# Patient Record
Sex: Male | Born: 1955 | Race: Black or African American | Hispanic: No | Marital: Single | Smoking: Never smoker
Health system: Southern US, Community
[De-identification: ages and names within clinical notes are randomized; demographics above are authoritative.]

## PROBLEM LIST (undated history)

## (undated) DIAGNOSIS — M329 Systemic lupus erythematosus, unspecified: Secondary | ICD-10-CM

## (undated) DIAGNOSIS — D61818 Other pancytopenia: Secondary | ICD-10-CM

## (undated) DIAGNOSIS — Z992 Dependence on renal dialysis: Secondary | ICD-10-CM

## (undated) DIAGNOSIS — K219 Gastro-esophageal reflux disease without esophagitis: Secondary | ICD-10-CM

## (undated) DIAGNOSIS — M3214 Glomerular disease in systemic lupus erythematosus: Secondary | ICD-10-CM

## (undated) DIAGNOSIS — B20 Human immunodeficiency virus [HIV] disease: Secondary | ICD-10-CM

## (undated) DIAGNOSIS — Z8711 Personal history of peptic ulcer disease: Secondary | ICD-10-CM

## (undated) DIAGNOSIS — W19XXXA Unspecified fall, initial encounter: Secondary | ICD-10-CM

## (undated) DIAGNOSIS — Z8739 Personal history of other diseases of the musculoskeletal system and connective tissue: Secondary | ICD-10-CM

## (undated) DIAGNOSIS — N151 Renal and perinephric abscess: Secondary | ICD-10-CM

## (undated) DIAGNOSIS — Z8719 Personal history of other diseases of the digestive system: Secondary | ICD-10-CM

## (undated) DIAGNOSIS — K220 Achalasia of cardia: Secondary | ICD-10-CM

## (undated) DIAGNOSIS — E785 Hyperlipidemia, unspecified: Secondary | ICD-10-CM

## (undated) DIAGNOSIS — B3781 Candidal esophagitis: Secondary | ICD-10-CM

## (undated) DIAGNOSIS — K648 Other hemorrhoids: Secondary | ICD-10-CM

## (undated) DIAGNOSIS — R2681 Unsteadiness on feet: Secondary | ICD-10-CM

## (undated) DIAGNOSIS — G4700 Insomnia, unspecified: Secondary | ICD-10-CM

## (undated) DIAGNOSIS — Z9289 Personal history of other medical treatment: Secondary | ICD-10-CM

## (undated) DIAGNOSIS — I82409 Acute embolism and thrombosis of unspecified deep veins of unspecified lower extremity: Secondary | ICD-10-CM

## (undated) DIAGNOSIS — Z21 Asymptomatic human immunodeficiency virus [HIV] infection status: Secondary | ICD-10-CM

## (undated) DIAGNOSIS — N186 End stage renal disease: Secondary | ICD-10-CM

## (undated) DIAGNOSIS — I493 Ventricular premature depolarization: Secondary | ICD-10-CM

## (undated) DIAGNOSIS — I10 Essential (primary) hypertension: Secondary | ICD-10-CM

## (undated) HISTORY — PX: HERNIA REPAIR: SHX51

## (undated) HISTORY — DX: Renal and perinephric abscess: N15.1

## (undated) HISTORY — DX: Essential (primary) hypertension: I10

## (undated) HISTORY — DX: Asymptomatic human immunodeficiency virus (hiv) infection status: Z21

## (undated) HISTORY — DX: Other hemorrhoids: K64.8

## (undated) HISTORY — DX: Unsteadiness on feet: R26.81

## (undated) HISTORY — DX: Hyperlipidemia, unspecified: E78.5

## (undated) HISTORY — DX: Achalasia of cardia: K22.0

## (undated) HISTORY — PX: NEPHRECTOMY: SHX65

## (undated) HISTORY — PX: KIDNEY TRANSPLANT: SHX239

## (undated) HISTORY — DX: Insomnia, unspecified: G47.00

## (undated) HISTORY — DX: Human immunodeficiency virus (HIV) disease: B20

---

## 1997-04-03 HISTORY — PX: HELLER MYOTOMY: SHX5259

## 1997-07-29 ENCOUNTER — Encounter: Admission: RE | Admit: 1997-07-29 | Discharge: 1997-07-29 | Payer: Self-pay | Admitting: Family Medicine

## 1997-08-10 ENCOUNTER — Ambulatory Visit (HOSPITAL_COMMUNITY): Admission: RE | Admit: 1997-08-10 | Discharge: 1997-08-10 | Payer: Self-pay | Admitting: Family Medicine

## 1997-09-29 ENCOUNTER — Ambulatory Visit (HOSPITAL_COMMUNITY): Admission: RE | Admit: 1997-09-29 | Discharge: 1997-09-29 | Payer: Self-pay | Admitting: Gastroenterology

## 1997-10-12 ENCOUNTER — Ambulatory Visit (HOSPITAL_COMMUNITY): Admission: RE | Admit: 1997-10-12 | Discharge: 1997-10-12 | Payer: Self-pay | Admitting: Gastroenterology

## 1997-12-22 ENCOUNTER — Encounter: Payer: Self-pay | Admitting: General Surgery

## 1997-12-22 ENCOUNTER — Inpatient Hospital Stay (HOSPITAL_COMMUNITY): Admission: RE | Admit: 1997-12-22 | Discharge: 1997-12-23 | Payer: Self-pay | Admitting: General Surgery

## 1998-02-01 ENCOUNTER — Ambulatory Visit (HOSPITAL_COMMUNITY): Admission: RE | Admit: 1998-02-01 | Discharge: 1998-02-01 | Payer: Self-pay | Admitting: General Surgery

## 1998-02-01 ENCOUNTER — Encounter: Payer: Self-pay | Admitting: General Surgery

## 1998-06-16 ENCOUNTER — Encounter: Admission: RE | Admit: 1998-06-16 | Discharge: 1998-06-16 | Payer: Self-pay | Admitting: Family Medicine

## 1999-02-03 ENCOUNTER — Encounter: Admission: RE | Admit: 1999-02-03 | Discharge: 1999-02-03 | Payer: Self-pay | Admitting: Family Medicine

## 1999-06-08 ENCOUNTER — Encounter: Admission: RE | Admit: 1999-06-08 | Discharge: 1999-06-08 | Payer: Self-pay | Admitting: Family Medicine

## 2000-08-23 ENCOUNTER — Encounter: Admission: RE | Admit: 2000-08-23 | Discharge: 2000-08-23 | Payer: Self-pay | Admitting: Family Medicine

## 2003-07-28 ENCOUNTER — Encounter: Admission: RE | Admit: 2003-07-28 | Discharge: 2003-07-28 | Payer: Self-pay | Admitting: Sports Medicine

## 2003-08-04 ENCOUNTER — Encounter: Admission: RE | Admit: 2003-08-04 | Discharge: 2003-08-04 | Payer: Self-pay | Admitting: Sports Medicine

## 2003-08-04 ENCOUNTER — Encounter: Admission: RE | Admit: 2003-08-04 | Discharge: 2003-08-04 | Payer: Self-pay | Admitting: Family Medicine

## 2003-08-26 ENCOUNTER — Encounter: Admission: RE | Admit: 2003-08-26 | Discharge: 2003-08-26 | Payer: Self-pay | Admitting: Family Medicine

## 2004-11-11 ENCOUNTER — Ambulatory Visit: Payer: Self-pay | Admitting: Family Medicine

## 2004-12-29 ENCOUNTER — Ambulatory Visit: Payer: Self-pay | Admitting: Family Medicine

## 2005-06-16 ENCOUNTER — Ambulatory Visit: Payer: Self-pay | Admitting: Sports Medicine

## 2005-06-27 ENCOUNTER — Ambulatory Visit: Payer: Self-pay | Admitting: Sports Medicine

## 2005-12-05 ENCOUNTER — Ambulatory Visit: Payer: Self-pay | Admitting: Family Medicine

## 2005-12-12 ENCOUNTER — Encounter: Admission: RE | Admit: 2005-12-12 | Discharge: 2005-12-12 | Payer: Self-pay | Admitting: Sports Medicine

## 2005-12-25 ENCOUNTER — Ambulatory Visit: Payer: Self-pay | Admitting: Family Medicine

## 2006-02-01 ENCOUNTER — Ambulatory Visit (HOSPITAL_COMMUNITY): Admission: RE | Admit: 2006-02-01 | Discharge: 2006-02-01 | Payer: Self-pay | Admitting: Gastroenterology

## 2006-02-15 ENCOUNTER — Ambulatory Visit: Payer: Self-pay | Admitting: Family Medicine

## 2006-04-03 HISTORY — PX: COLONOSCOPY: SHX174

## 2006-04-25 ENCOUNTER — Encounter: Payer: Self-pay | Admitting: Family Medicine

## 2006-04-25 ENCOUNTER — Ambulatory Visit: Payer: Self-pay | Admitting: Family Medicine

## 2006-04-25 LAB — CONVERTED CEMR LAB
INR: 1.3 (ref 0.0–1.5)
Prothrombin Time: 16.5 s — ABNORMAL HIGH (ref 11.6–15.2)
aPTT: 34 s (ref 24–37)

## 2006-05-18 ENCOUNTER — Ambulatory Visit (HOSPITAL_COMMUNITY): Admission: RE | Admit: 2006-05-18 | Discharge: 2006-05-18 | Payer: Self-pay | Admitting: Gastroenterology

## 2006-05-31 DIAGNOSIS — F5232 Male orgasmic disorder: Secondary | ICD-10-CM | POA: Insufficient documentation

## 2006-05-31 DIAGNOSIS — G4700 Insomnia, unspecified: Secondary | ICD-10-CM | POA: Insufficient documentation

## 2006-05-31 DIAGNOSIS — E785 Hyperlipidemia, unspecified: Secondary | ICD-10-CM | POA: Insufficient documentation

## 2006-05-31 DIAGNOSIS — R1310 Dysphagia, unspecified: Secondary | ICD-10-CM | POA: Insufficient documentation

## 2006-05-31 DIAGNOSIS — I10 Essential (primary) hypertension: Secondary | ICD-10-CM | POA: Insufficient documentation

## 2006-06-27 ENCOUNTER — Ambulatory Visit (HOSPITAL_COMMUNITY): Admission: RE | Admit: 2006-06-27 | Discharge: 2006-06-27 | Payer: Self-pay | Admitting: Gastroenterology

## 2006-06-29 ENCOUNTER — Telehealth: Payer: Self-pay | Admitting: *Deleted

## 2006-07-02 ENCOUNTER — Ambulatory Visit: Payer: Self-pay | Admitting: Sports Medicine

## 2006-07-24 ENCOUNTER — Encounter: Payer: Self-pay | Admitting: *Deleted

## 2006-07-24 ENCOUNTER — Encounter (INDEPENDENT_AMBULATORY_CARE_PROVIDER_SITE_OTHER): Payer: Self-pay | Admitting: *Deleted

## 2006-07-24 ENCOUNTER — Ambulatory Visit: Payer: Self-pay | Admitting: Family Medicine

## 2006-07-24 DIAGNOSIS — R634 Abnormal weight loss: Secondary | ICD-10-CM | POA: Insufficient documentation

## 2006-07-24 DIAGNOSIS — B20 Human immunodeficiency virus [HIV] disease: Secondary | ICD-10-CM | POA: Insufficient documentation

## 2006-07-24 LAB — CONVERTED CEMR LAB
BUN: 21 mg/dL (ref 6–23)
CO2: 26 meq/L (ref 19–32)
Calcium: 9.4 mg/dL (ref 8.4–10.5)
Chloride: 101 meq/L (ref 96–112)
Creatinine, Ser: 1.46 mg/dL (ref 0.40–1.50)
Direct LDL: 65 mg/dL
Glucose, Bld: 79 mg/dL (ref 70–99)
HIV-1 antibody: POSITIVE — AB
HIV-2 Ab: UNDETERMINED — AB
HIV: REACTIVE
Potassium: 4.5 meq/L (ref 3.5–5.3)
Sodium: 138 meq/L (ref 135–145)
TSH: 2.872 microintl units/mL (ref 0.350–5.50)

## 2006-07-27 ENCOUNTER — Telehealth (INDEPENDENT_AMBULATORY_CARE_PROVIDER_SITE_OTHER): Payer: Self-pay | Admitting: *Deleted

## 2006-08-01 ENCOUNTER — Ambulatory Visit: Payer: Self-pay | Admitting: Family Medicine

## 2006-08-01 ENCOUNTER — Encounter: Payer: Self-pay | Admitting: Internal Medicine

## 2006-08-01 ENCOUNTER — Other Ambulatory Visit: Admission: RE | Admit: 2006-08-01 | Discharge: 2006-08-01 | Payer: Self-pay | Admitting: *Deleted

## 2006-08-01 ENCOUNTER — Ambulatory Visit: Admission: RE | Admit: 2006-08-01 | Discharge: 2006-08-01 | Payer: Self-pay | Admitting: Family Medicine

## 2006-08-01 ENCOUNTER — Encounter (INDEPENDENT_AMBULATORY_CARE_PROVIDER_SITE_OTHER): Payer: Self-pay | Admitting: *Deleted

## 2006-08-01 LAB — CONVERTED CEMR LAB
ALT: 41 units/L (ref 0–53)
AST: 34 units/L (ref 0–37)
Albumin: 3.8 g/dL (ref 3.5–5.2)
Alkaline Phosphatase: 81 units/L (ref 39–117)
BUN: 17 mg/dL (ref 6–23)
CO2: 24 meq/L (ref 19–32)
Calcium: 9.2 mg/dL (ref 8.4–10.5)
Chloride: 102 meq/L (ref 96–112)
Creatinine, Ser: 1.27 mg/dL (ref 0.40–1.50)
Glucose, Bld: 92 mg/dL (ref 70–99)
HCV Ab: NEGATIVE
HIV 1 RNA Quant: 15400 copies/mL — ABNORMAL HIGH (ref <50)
HIV-1 RNA Quant, Log: 4.19 — ABNORMAL HIGH (ref <1.70)
Hep A Total Ab: NEGATIVE
Hep B S Ab: NEGATIVE
Hepatitis B Surface Ag: NEGATIVE
Potassium: 4.5 meq/L (ref 3.5–5.3)
Sodium: 138 meq/L (ref 135–145)
Total Bilirubin: 0.5 mg/dL (ref 0.3–1.2)
Total Protein: 8.8 g/dL — ABNORMAL HIGH (ref 6.0–8.3)
Toxoplasma IgG Ratio: 12.5

## 2006-08-03 ENCOUNTER — Ambulatory Visit: Payer: Self-pay | Admitting: Family Medicine

## 2006-08-16 ENCOUNTER — Encounter: Admission: RE | Admit: 2006-08-16 | Discharge: 2006-08-16 | Payer: Self-pay | Admitting: Internal Medicine

## 2006-08-16 ENCOUNTER — Ambulatory Visit: Payer: Self-pay | Admitting: Internal Medicine

## 2006-08-16 LAB — CONVERTED CEMR LAB
ALT: 19 units/L (ref 0–53)
AST: 20 units/L (ref 0–37)
Albumin: 3.6 g/dL (ref 3.5–5.2)
Alkaline Phosphatase: 67 units/L (ref 39–117)
BUN: 16 mg/dL (ref 6–23)
Basophils Absolute: 0 10*3/uL (ref 0.0–0.1)
Basophils Relative: 0 % (ref 0–1)
Bilirubin Urine: NEGATIVE
CD4 Count: 600 microliters
CO2: 25 meq/L (ref 19–32)
Calcium: 9.1 mg/dL (ref 8.4–10.5)
Chlamydia, Swab/Urine, PCR: NEGATIVE
Chloride: 102 meq/L (ref 96–112)
Cholesterol: 102 mg/dL (ref 0–200)
Creatinine, Ser: 1.17 mg/dL (ref 0.40–1.50)
Eosinophils Absolute: 0.1 10*3/uL (ref 0.0–0.7)
Eosinophils Relative: 3 % (ref 0–5)
GC Probe Amp, Urine: NEGATIVE
Glucose, Bld: 108 mg/dL — ABNORMAL HIGH (ref 70–99)
HCT: 32.7 % — ABNORMAL LOW (ref 39.0–52.0)
HCV Ab: NEGATIVE
HDL: 31 mg/dL — ABNORMAL LOW (ref >39)
HIV 1 RNA Quant: 7470 copies/mL — ABNORMAL HIGH (ref <50)
HIV-1 RNA Quant, Log: 3.87 — ABNORMAL HIGH (ref <1.70)
HIV-1 antibody: POSITIVE — AB
HIV-2 Ab: UNDETERMINED — AB
HIV: REACTIVE
Hemoglobin, Urine: NEGATIVE
Hemoglobin: 10.6 g/dL — ABNORMAL LOW (ref 13.0–17.0)
Hep B Core Total Ab: NEGATIVE
Hep B S Ab: NEGATIVE
Hepatitis B Surface Ag: NEGATIVE
Ketones, ur: NEGATIVE mg/dL
LDL Cholesterol: 59 mg/dL (ref 0–99)
Leukocytes, UA: NEGATIVE
Lymphocytes Relative: 45 % (ref 12–46)
Lymphs Abs: 2.2 10*3/uL (ref 0.7–3.3)
MCHC: 32.4 g/dL (ref 30.0–36.0)
MCV: 91.9 fL (ref 78.0–100.0)
Monocytes Absolute: 0.6 10*3/uL (ref 0.2–0.7)
Monocytes Relative: 12 % — ABNORMAL HIGH (ref 3–11)
Neutro Abs: 2 10*3/uL (ref 1.7–7.7)
Neutrophils Relative %: 41 % — ABNORMAL LOW (ref 43–77)
Nitrite: NEGATIVE
Platelets: 213 10*3/uL (ref 150–400)
Potassium: 4.2 meq/L (ref 3.5–5.3)
Protein, ur: NEGATIVE mg/dL
RBC: 3.56 M/uL — ABNORMAL LOW (ref 4.22–5.81)
RDW: 14.5 % — ABNORMAL HIGH (ref 11.5–14.0)
Sodium: 138 meq/L (ref 135–145)
Specific Gravity, Urine: 1.024 (ref 1.005–1.03)
Total Bilirubin: 0.4 mg/dL (ref 0.3–1.2)
Total CHOL/HDL Ratio: 3.3
Total Protein: 8.5 g/dL — ABNORMAL HIGH (ref 6.0–8.3)
Triglycerides: 62 mg/dL (ref <150)
Urine Glucose: NEGATIVE mg/dL
Urobilinogen, UA: 0.2 (ref 0.0–1.0)
VLDL: 12 mg/dL (ref 0–40)
WBC: 4.9 10*3/uL (ref 4.0–10.5)
pH: 6 (ref 5.0–8.0)

## 2006-08-22 ENCOUNTER — Ambulatory Visit: Payer: Self-pay | Admitting: Sports Medicine

## 2006-08-31 ENCOUNTER — Ambulatory Visit: Payer: Self-pay | Admitting: Internal Medicine

## 2006-08-31 DIAGNOSIS — D649 Anemia, unspecified: Secondary | ICD-10-CM

## 2006-08-31 DIAGNOSIS — D539 Nutritional anemia, unspecified: Secondary | ICD-10-CM | POA: Insufficient documentation

## 2006-09-19 ENCOUNTER — Encounter (INDEPENDENT_AMBULATORY_CARE_PROVIDER_SITE_OTHER): Payer: Self-pay | Admitting: *Deleted

## 2006-09-24 ENCOUNTER — Ambulatory Visit: Payer: Self-pay | Admitting: Family Medicine

## 2006-09-28 ENCOUNTER — Encounter: Payer: Self-pay | Admitting: Internal Medicine

## 2006-10-02 ENCOUNTER — Ambulatory Visit: Payer: Self-pay | Admitting: Internal Medicine

## 2006-10-09 ENCOUNTER — Encounter: Payer: Self-pay | Admitting: Internal Medicine

## 2006-11-12 ENCOUNTER — Ambulatory Visit: Payer: Self-pay | Admitting: Internal Medicine

## 2006-11-12 ENCOUNTER — Encounter: Admission: RE | Admit: 2006-11-12 | Discharge: 2006-11-12 | Payer: Self-pay | Admitting: Internal Medicine

## 2006-11-12 LAB — CONVERTED CEMR LAB
ALT: 22 units/L (ref 0–53)
AST: 24 units/L (ref 0–37)
Albumin: 3.7 g/dL (ref 3.5–5.2)
Alkaline Phosphatase: 60 units/L (ref 39–117)
BUN: 12 mg/dL (ref 6–23)
Basophils Absolute: 0 10*3/uL (ref 0.0–0.1)
Basophils Relative: 1 % (ref 0–1)
CO2: 30 meq/L (ref 19–32)
Calcium: 9.1 mg/dL (ref 8.4–10.5)
Chloride: 105 meq/L (ref 96–112)
Creatinine, Ser: 1.25 mg/dL (ref 0.40–1.50)
Eosinophils Absolute: 0.1 10*3/uL (ref 0.0–0.7)
Eosinophils Relative: 3 % (ref 0–5)
Glucose, Bld: 103 mg/dL — ABNORMAL HIGH (ref 70–99)
HCT: 37.6 % — ABNORMAL LOW (ref 39.0–52.0)
HIV 1 RNA Quant: 9280 copies/mL — ABNORMAL HIGH (ref <50)
HIV-1 RNA Quant, Log: 3.97 — ABNORMAL HIGH (ref <1.70)
Hemoglobin: 12.3 g/dL — ABNORMAL LOW (ref 13.0–17.0)
Lymphocytes Relative: 45 % (ref 12–46)
Lymphs Abs: 1.9 10*3/uL (ref 0.7–3.3)
MCHC: 32.7 g/dL (ref 30.0–36.0)
MCV: 90.6 fL (ref 78.0–100.0)
Monocytes Absolute: 0.5 10*3/uL (ref 0.2–0.7)
Monocytes Relative: 12 % — ABNORMAL HIGH (ref 3–11)
Neutro Abs: 1.8 10*3/uL (ref 1.7–7.7)
Neutrophils Relative %: 40 % — ABNORMAL LOW (ref 43–77)
Platelets: 171 10*3/uL (ref 150–400)
Potassium: 4.6 meq/L (ref 3.5–5.3)
RBC: 4.15 M/uL — ABNORMAL LOW (ref 4.22–5.81)
RDW: 13.5 % (ref 11.5–14.0)
Sodium: 140 meq/L (ref 135–145)
Total Bilirubin: 0.4 mg/dL (ref 0.3–1.2)
Total Protein: 8.6 g/dL — ABNORMAL HIGH (ref 6.0–8.3)
WBC: 4.3 10*3/uL (ref 4.0–10.5)

## 2006-11-26 ENCOUNTER — Encounter (INDEPENDENT_AMBULATORY_CARE_PROVIDER_SITE_OTHER): Payer: Self-pay | Admitting: *Deleted

## 2006-11-28 ENCOUNTER — Ambulatory Visit: Payer: Self-pay | Admitting: Internal Medicine

## 2007-02-19 ENCOUNTER — Ambulatory Visit: Payer: Self-pay | Admitting: Internal Medicine

## 2007-02-19 ENCOUNTER — Encounter: Admission: RE | Admit: 2007-02-19 | Discharge: 2007-02-19 | Payer: Self-pay | Admitting: Internal Medicine

## 2007-02-19 LAB — CONVERTED CEMR LAB
ALT: 40 units/L (ref 0–53)
AST: 31 units/L (ref 0–37)
Albumin: 3.7 g/dL (ref 3.5–5.2)
Alkaline Phosphatase: 66 units/L (ref 39–117)
BUN: 16 mg/dL (ref 6–23)
Basophils Absolute: 0 10*3/uL (ref 0.0–0.1)
Basophils Relative: 1 % (ref 0–1)
CO2: 27 meq/L (ref 19–32)
Calcium: 9.1 mg/dL (ref 8.4–10.5)
Chloride: 105 meq/L (ref 96–112)
Cholesterol: 105 mg/dL (ref 0–200)
Creatinine, Ser: 1.37 mg/dL (ref 0.40–1.50)
Eosinophils Absolute: 0.1 10*3/uL — ABNORMAL LOW (ref 0.2–0.7)
Eosinophils Relative: 3 % (ref 0–5)
Glucose, Bld: 91 mg/dL (ref 70–99)
HCT: 41 % (ref 39.0–52.0)
HDL: 33 mg/dL — ABNORMAL LOW (ref >39)
HIV 1 RNA Quant: 6840 copies/mL — ABNORMAL HIGH (ref <50)
HIV-1 RNA Quant, Log: 3.84 — ABNORMAL HIGH (ref <1.70)
Hemoglobin: 12.9 g/dL — ABNORMAL LOW (ref 13.0–17.0)
LDL Cholesterol: 60 mg/dL (ref 0–99)
Lymphocytes Relative: 63 % — ABNORMAL HIGH (ref 12–46)
Lymphs Abs: 2.7 10*3/uL (ref 0.7–4.0)
MCHC: 31.5 g/dL (ref 30.0–36.0)
MCV: 91.5 fL (ref 78.0–100.0)
Monocytes Absolute: 0.4 10*3/uL (ref 0.1–1.0)
Monocytes Relative: 8 % (ref 3–12)
Neutro Abs: 1.1 10*3/uL — ABNORMAL LOW (ref 1.7–7.7)
Neutrophils Relative %: 26 % — ABNORMAL LOW (ref 43–77)
Platelets: 161 10*3/uL (ref 150–400)
Potassium: 4.3 meq/L (ref 3.5–5.3)
RBC: 4.48 M/uL (ref 4.22–5.81)
RDW: 13.5 % (ref 11.5–15.5)
Sodium: 141 meq/L (ref 135–145)
Total Bilirubin: 0.5 mg/dL (ref 0.3–1.2)
Total CHOL/HDL Ratio: 3.2
Total Protein: 8.7 g/dL — ABNORMAL HIGH (ref 6.0–8.3)
Triglycerides: 58 mg/dL (ref <150)
VLDL: 12 mg/dL (ref 0–40)
WBC: 4.4 10*3/uL (ref 4.0–10.5)

## 2007-03-08 ENCOUNTER — Ambulatory Visit: Payer: Self-pay | Admitting: Internal Medicine

## 2007-03-20 ENCOUNTER — Encounter: Payer: Self-pay | Admitting: Internal Medicine

## 2007-03-20 LAB — CONVERTED CEMR LAB
HCV Ab: NEGATIVE
Hep B S Ab: NEGATIVE
Hepatitis B Surface Ag: NEGATIVE

## 2007-04-01 ENCOUNTER — Encounter: Payer: Self-pay | Admitting: Internal Medicine

## 2007-05-01 ENCOUNTER — Telehealth: Payer: Self-pay | Admitting: *Deleted

## 2007-05-06 ENCOUNTER — Ambulatory Visit: Payer: Self-pay | Admitting: Family Medicine

## 2007-05-06 ENCOUNTER — Encounter (INDEPENDENT_AMBULATORY_CARE_PROVIDER_SITE_OTHER): Payer: Self-pay | Admitting: *Deleted

## 2007-05-07 ENCOUNTER — Encounter (INDEPENDENT_AMBULATORY_CARE_PROVIDER_SITE_OTHER): Payer: Self-pay | Admitting: *Deleted

## 2007-05-07 LAB — CONVERTED CEMR LAB: PSA: 0.43 ng/mL (ref 0.10–4.00)

## 2007-05-14 ENCOUNTER — Encounter: Admission: RE | Admit: 2007-05-14 | Discharge: 2007-05-14 | Payer: Self-pay | Admitting: Internal Medicine

## 2007-05-14 ENCOUNTER — Ambulatory Visit: Payer: Self-pay | Admitting: Internal Medicine

## 2007-05-14 LAB — CONVERTED CEMR LAB
ALT: 46 units/L (ref 0–53)
AST: 39 units/L — ABNORMAL HIGH (ref 0–37)
Albumin: 3.8 g/dL (ref 3.5–5.2)
Alkaline Phosphatase: 80 units/L (ref 39–117)
BUN: 22 mg/dL (ref 6–23)
Basophils Absolute: 0 10*3/uL (ref 0.0–0.1)
Basophils Relative: 1 % (ref 0–1)
CO2: 25 meq/L (ref 19–32)
Calcium: 9.4 mg/dL (ref 8.4–10.5)
Chloride: 103 meq/L (ref 96–112)
Creatinine, Ser: 1.53 mg/dL — ABNORMAL HIGH (ref 0.40–1.50)
Eosinophils Absolute: 0.2 10*3/uL (ref 0.0–0.7)
Eosinophils Relative: 3 % (ref 0–5)
Glucose, Bld: 95 mg/dL (ref 70–99)
HCT: 40 % (ref 39.0–52.0)
HIV 1 RNA Quant: 9900 copies/mL — ABNORMAL HIGH (ref <50)
HIV-1 RNA Quant, Log: 4 — ABNORMAL HIGH (ref <1.70)
Hemoglobin: 13.3 g/dL (ref 13.0–17.0)
Lymphocytes Relative: 57 % — ABNORMAL HIGH (ref 12–46)
Lymphs Abs: 3 10*3/uL (ref 0.7–4.0)
MCHC: 33.3 g/dL (ref 30.0–36.0)
MCV: 90.3 fL (ref 78.0–100.0)
Monocytes Absolute: 0.5 10*3/uL (ref 0.1–1.0)
Monocytes Relative: 9 % (ref 3–12)
Neutro Abs: 1.6 10*3/uL — ABNORMAL LOW (ref 1.7–7.7)
Neutrophils Relative %: 30 % — ABNORMAL LOW (ref 43–77)
Platelets: 212 10*3/uL (ref 150–400)
Potassium: 4.8 meq/L (ref 3.5–5.3)
RBC: 4.43 M/uL (ref 4.22–5.81)
RDW: 13 % (ref 11.5–15.5)
Sodium: 138 meq/L (ref 135–145)
Total Bilirubin: 0.5 mg/dL (ref 0.3–1.2)
Total Protein: 9.3 g/dL — ABNORMAL HIGH (ref 6.0–8.3)
WBC: 5.3 10*3/uL (ref 4.0–10.5)

## 2007-06-05 ENCOUNTER — Encounter (INDEPENDENT_AMBULATORY_CARE_PROVIDER_SITE_OTHER): Payer: Self-pay | Admitting: *Deleted

## 2007-06-05 ENCOUNTER — Ambulatory Visit: Payer: Self-pay | Admitting: Internal Medicine

## 2007-09-09 ENCOUNTER — Encounter (INDEPENDENT_AMBULATORY_CARE_PROVIDER_SITE_OTHER): Payer: Self-pay | Admitting: *Deleted

## 2007-10-15 ENCOUNTER — Telehealth: Payer: Self-pay | Admitting: *Deleted

## 2007-10-15 ENCOUNTER — Ambulatory Visit: Payer: Self-pay | Admitting: Family Medicine

## 2008-01-08 ENCOUNTER — Ambulatory Visit: Payer: Self-pay | Admitting: Internal Medicine

## 2008-01-08 LAB — CONVERTED CEMR LAB
ALT: 31 units/L (ref 0–53)
AST: 36 units/L (ref 0–37)
Albumin: 3.6 g/dL (ref 3.5–5.2)
Alkaline Phosphatase: 78 units/L (ref 39–117)
BUN: 11 mg/dL (ref 6–23)
Basophils Absolute: 0 10*3/uL (ref 0.0–0.1)
Basophils Relative: 0 % (ref 0–1)
CO2: 26 meq/L (ref 19–32)
Calcium: 8.9 mg/dL (ref 8.4–10.5)
Chloride: 104 meq/L (ref 96–112)
Creatinine, Ser: 1.15 mg/dL (ref 0.40–1.50)
Eosinophils Absolute: 0.2 10*3/uL (ref 0.0–0.7)
Eosinophils Relative: 4 % (ref 0–5)
Glucose, Bld: 87 mg/dL (ref 70–99)
HCT: 39.2 % (ref 39.0–52.0)
HIV 1 RNA Quant: 49300 copies/mL — ABNORMAL HIGH (ref <50)
HIV-1 RNA Quant, Log: 4.69 — ABNORMAL HIGH (ref <1.70)
Hemoglobin: 12.7 g/dL — ABNORMAL LOW (ref 13.0–17.0)
Lymphocytes Relative: 60 % — ABNORMAL HIGH (ref 12–46)
Lymphs Abs: 2.9 10*3/uL (ref 0.7–4.0)
MCHC: 32.4 g/dL (ref 30.0–36.0)
MCV: 89.5 fL (ref 78.0–100.0)
Monocytes Absolute: 0.4 10*3/uL (ref 0.1–1.0)
Monocytes Relative: 7 % (ref 3–12)
Neutro Abs: 1.4 10*3/uL — ABNORMAL LOW (ref 1.7–7.7)
Neutrophils Relative %: 28 % — ABNORMAL LOW (ref 43–77)
Platelets: 150 10*3/uL (ref 150–400)
Potassium: 4.1 meq/L (ref 3.5–5.3)
RBC: 4.38 M/uL (ref 4.22–5.81)
RDW: 13.5 % (ref 11.5–15.5)
Sodium: 139 meq/L (ref 135–145)
Total Bilirubin: 0.4 mg/dL (ref 0.3–1.2)
Total Protein: 8.6 g/dL — ABNORMAL HIGH (ref 6.0–8.3)
WBC: 4.9 10*3/uL (ref 4.0–10.5)

## 2008-01-22 ENCOUNTER — Ambulatory Visit: Payer: Self-pay | Admitting: Internal Medicine

## 2008-03-31 ENCOUNTER — Telehealth (INDEPENDENT_AMBULATORY_CARE_PROVIDER_SITE_OTHER): Payer: Self-pay | Admitting: Family Medicine

## 2008-04-21 ENCOUNTER — Encounter: Payer: Self-pay | Admitting: Internal Medicine

## 2008-07-16 ENCOUNTER — Encounter (INDEPENDENT_AMBULATORY_CARE_PROVIDER_SITE_OTHER): Payer: Self-pay | Admitting: Licensed Clinical Social Worker

## 2008-07-16 ENCOUNTER — Ambulatory Visit: Payer: Self-pay | Admitting: Internal Medicine

## 2008-07-16 LAB — CONVERTED CEMR LAB
ALT: 39 units/L (ref 0–53)
AST: 39 units/L — ABNORMAL HIGH (ref 0–37)
Alkaline Phosphatase: 77 units/L (ref 39–117)
Basophils Relative: 1 % (ref 0–1)
Calcium: 9 mg/dL (ref 8.4–10.5)
Chloride: 104 meq/L (ref 96–112)
Creatinine, Ser: 1.29 mg/dL (ref 0.40–1.50)
Eosinophils Absolute: 0.2 10*3/uL (ref 0.0–0.7)
Eosinophils Relative: 4 % (ref 0–5)
HCT: 35.7 % — ABNORMAL LOW (ref 39.0–52.0)
HDL: 26 mg/dL — ABNORMAL LOW (ref >39)
HIV 1 RNA Quant: 70800 copies/mL — ABNORMAL HIGH (ref <48)
LDL Cholesterol: 56 mg/dL (ref 0–99)
Lymphs Abs: 2.4 10*3/uL (ref 0.7–4.0)
MCHC: 32.5 g/dL (ref 30.0–36.0)
MCV: 90.2 fL (ref 78.0–100.0)
Monocytes Absolute: 0.5 10*3/uL (ref 0.1–1.0)
Monocytes Relative: 12 % (ref 3–12)
Neutrophils Relative %: 25 % — ABNORMAL LOW (ref 43–77)
Potassium: 4.1 meq/L (ref 3.5–5.3)
RBC: 3.96 M/uL — ABNORMAL LOW (ref 4.22–5.81)
Total CHOL/HDL Ratio: 3.5
VLDL: 10 mg/dL (ref 0–40)

## 2008-07-31 ENCOUNTER — Ambulatory Visit: Payer: Self-pay | Admitting: Internal Medicine

## 2008-09-17 ENCOUNTER — Ambulatory Visit: Payer: Self-pay | Admitting: Internal Medicine

## 2008-09-17 LAB — CONVERTED CEMR LAB
AST: 29 units/L (ref 0–37)
Albumin: 3.7 g/dL (ref 3.5–5.2)
Alkaline Phosphatase: 66 units/L (ref 39–117)
BUN: 13 mg/dL (ref 6–23)
Creatinine, Ser: 1.4 mg/dL (ref 0.40–1.50)
Eosinophils Absolute: 0.2 10*3/uL (ref 0.0–0.7)
Eosinophils Relative: 5 % (ref 0–5)
GFR calc non Af Amer: 53 mL/min — ABNORMAL LOW (ref >60)
Glucose, Bld: 94 mg/dL (ref 70–99)
HCT: 34.6 % — ABNORMAL LOW (ref 39.0–52.0)
HIV-1 RNA Quant, Log: 4.6 — ABNORMAL HIGH (ref <1.68)
Lymphocytes Relative: 53 % — ABNORMAL HIGH (ref 12–46)
Lymphs Abs: 2.5 10*3/uL (ref 0.7–4.0)
MCV: 89.2 fL (ref 78.0–100.0)
Monocytes Relative: 11 % (ref 3–12)
Neutrophils Relative %: 31 % — ABNORMAL LOW (ref 43–77)
Platelets: 180 10*3/uL (ref 150–400)
RBC: 3.88 M/uL — ABNORMAL LOW (ref 4.22–5.81)
Total Bilirubin: 0.3 mg/dL (ref 0.3–1.2)
WBC: 4.6 10*3/uL (ref 4.0–10.5)

## 2008-10-13 ENCOUNTER — Ambulatory Visit: Payer: Self-pay | Admitting: Internal Medicine

## 2008-11-11 ENCOUNTER — Ambulatory Visit: Payer: Self-pay | Admitting: Family Medicine

## 2008-11-16 ENCOUNTER — Encounter: Admission: RE | Admit: 2008-11-16 | Discharge: 2008-11-16 | Payer: Self-pay | Admitting: Family Medicine

## 2008-11-18 ENCOUNTER — Ambulatory Visit: Payer: Self-pay | Admitting: Family Medicine

## 2008-11-18 ENCOUNTER — Encounter: Payer: Self-pay | Admitting: Family Medicine

## 2008-11-19 LAB — CONVERTED CEMR LAB
Free T4: 0.88 ng/dL (ref 0.80–1.80)
TSH: 3.75 microintl units/mL (ref 0.350–4.500)

## 2008-11-20 ENCOUNTER — Telehealth: Payer: Self-pay | Admitting: *Deleted

## 2008-11-23 ENCOUNTER — Telehealth: Payer: Self-pay | Admitting: *Deleted

## 2008-11-24 ENCOUNTER — Ambulatory Visit: Payer: Self-pay | Admitting: Infectious Diseases

## 2008-11-24 LAB — CONVERTED CEMR LAB
Basophils Absolute: 0 10*3/uL (ref 0.0–0.1)
Basophils Relative: 0 % (ref 0–1)
Lymphocytes Relative: 32 % (ref 12–46)
MCHC: 32.7 g/dL (ref 30.0–36.0)
Neutro Abs: 1.7 10*3/uL (ref 1.7–7.7)
Neutrophils Relative %: 46 % (ref 43–77)
RDW: 14.1 % (ref 11.5–15.5)

## 2008-11-30 ENCOUNTER — Ambulatory Visit: Payer: Self-pay | Admitting: Infectious Diseases

## 2009-01-01 ENCOUNTER — Encounter: Payer: Self-pay | Admitting: Family Medicine

## 2009-01-01 ENCOUNTER — Emergency Department (HOSPITAL_COMMUNITY): Admission: EM | Admit: 2009-01-01 | Discharge: 2009-01-01 | Payer: Self-pay | Admitting: Family Medicine

## 2009-01-01 ENCOUNTER — Ambulatory Visit: Payer: Self-pay | Admitting: Family Medicine

## 2009-01-01 ENCOUNTER — Inpatient Hospital Stay (HOSPITAL_COMMUNITY): Admission: EM | Admit: 2009-01-01 | Discharge: 2009-01-09 | Payer: Self-pay | Admitting: Emergency Medicine

## 2009-01-02 ENCOUNTER — Encounter: Payer: Self-pay | Admitting: Family Medicine

## 2009-01-05 ENCOUNTER — Ambulatory Visit: Payer: Self-pay | Admitting: Infectious Disease

## 2009-01-06 ENCOUNTER — Encounter: Payer: Self-pay | Admitting: Family Medicine

## 2009-01-09 ENCOUNTER — Encounter: Payer: Self-pay | Admitting: Family Medicine

## 2009-01-11 ENCOUNTER — Telehealth: Payer: Self-pay

## 2009-01-12 ENCOUNTER — Telehealth: Payer: Self-pay | Admitting: *Deleted

## 2009-01-13 ENCOUNTER — Ambulatory Visit: Payer: Self-pay | Admitting: Infectious Disease

## 2009-01-15 ENCOUNTER — Ambulatory Visit: Payer: Self-pay | Admitting: Internal Medicine

## 2009-01-15 DIAGNOSIS — N151 Renal and perinephric abscess: Secondary | ICD-10-CM | POA: Insufficient documentation

## 2009-01-15 LAB — CONVERTED CEMR LAB
ALT: 17 units/L (ref 0–53)
AST: 20 units/L (ref 0–37)
Calcium: 8.6 mg/dL (ref 8.4–10.5)
Chloride: 105 meq/L (ref 96–112)
Creatinine, Ser: 1.67 mg/dL — ABNORMAL HIGH (ref 0.40–1.50)
Eosinophils Absolute: 0.1 10*3/uL (ref 0.0–0.7)
Lymphocytes Relative: 22 % (ref 12–46)
Lymphs Abs: 1.3 10*3/uL (ref 0.7–4.0)
Neutro Abs: 4.2 10*3/uL (ref 1.7–7.7)
Neutrophils Relative %: 71 % (ref 43–77)
Platelets: 314 10*3/uL (ref 150–400)
Potassium: 4.5 meq/L (ref 3.5–5.3)
RBC: 3.13 M/uL — ABNORMAL LOW (ref 4.22–5.81)
Sodium: 137 meq/L (ref 135–145)
Total Protein: 8.2 g/dL (ref 6.0–8.3)
WBC: 5.9 10*3/uL (ref 4.0–10.5)

## 2009-01-18 ENCOUNTER — Encounter: Payer: Self-pay | Admitting: Internal Medicine

## 2009-01-18 ENCOUNTER — Ambulatory Visit (HOSPITAL_COMMUNITY): Admission: RE | Admit: 2009-01-18 | Discharge: 2009-01-18 | Payer: Self-pay | Admitting: Internal Medicine

## 2009-01-19 ENCOUNTER — Telehealth: Payer: Self-pay | Admitting: Internal Medicine

## 2009-01-20 ENCOUNTER — Encounter: Payer: Self-pay | Admitting: Family Medicine

## 2009-01-20 ENCOUNTER — Ambulatory Visit: Payer: Self-pay | Admitting: Family Medicine

## 2009-01-20 DIAGNOSIS — K644 Residual hemorrhoidal skin tags: Secondary | ICD-10-CM | POA: Insufficient documentation

## 2009-01-20 DIAGNOSIS — N189 Chronic kidney disease, unspecified: Secondary | ICD-10-CM | POA: Insufficient documentation

## 2009-01-26 ENCOUNTER — Telehealth: Payer: Self-pay | Admitting: Infectious Disease

## 2009-01-28 ENCOUNTER — Encounter: Payer: Self-pay | Admitting: Infectious Disease

## 2009-02-05 ENCOUNTER — Telehealth: Payer: Self-pay | Admitting: Family Medicine

## 2009-02-08 ENCOUNTER — Ambulatory Visit: Payer: Self-pay | Admitting: Family Medicine

## 2009-02-08 DIAGNOSIS — R609 Edema, unspecified: Secondary | ICD-10-CM | POA: Insufficient documentation

## 2009-02-17 ENCOUNTER — Ambulatory Visit: Admission: RE | Admit: 2009-02-17 | Discharge: 2009-02-17 | Payer: Self-pay | Admitting: Urology

## 2009-02-17 ENCOUNTER — Encounter (INDEPENDENT_AMBULATORY_CARE_PROVIDER_SITE_OTHER): Payer: Self-pay | Admitting: Urology

## 2009-02-17 ENCOUNTER — Ambulatory Visit: Payer: Self-pay | Admitting: Vascular Surgery

## 2009-02-17 ENCOUNTER — Encounter: Payer: Self-pay | Admitting: Family Medicine

## 2009-02-19 ENCOUNTER — Telehealth: Payer: Self-pay | Admitting: Internal Medicine

## 2009-02-23 ENCOUNTER — Ambulatory Visit: Payer: Self-pay | Admitting: Internal Medicine

## 2009-02-23 LAB — CONVERTED CEMR LAB
AST: 17 units/L (ref 0–37)
Alkaline Phosphatase: 50 units/L (ref 39–117)
BUN: 9 mg/dL (ref 6–23)
Basophils Absolute: 0 10*3/uL (ref 0.0–0.1)
Creatinine, Ser: 1.03 mg/dL (ref 0.40–1.50)
Eosinophils Absolute: 0.1 10*3/uL (ref 0.0–0.7)
Eosinophils Relative: 3 % (ref 0–5)
HCT: 25.7 % — ABNORMAL LOW (ref 39.0–52.0)
Lymphocytes Relative: 52 % — ABNORMAL HIGH (ref 12–46)
Platelets: 205 10*3/uL (ref 150–400)
RDW: 22.8 % — ABNORMAL HIGH (ref 11.5–15.5)

## 2009-03-12 ENCOUNTER — Ambulatory Visit: Payer: Self-pay | Admitting: Internal Medicine

## 2009-03-18 ENCOUNTER — Encounter: Payer: Self-pay | Admitting: Family Medicine

## 2009-03-18 ENCOUNTER — Ambulatory Visit: Payer: Self-pay | Admitting: Family Medicine

## 2009-04-06 ENCOUNTER — Encounter: Payer: Self-pay | Admitting: Family Medicine

## 2009-04-15 ENCOUNTER — Inpatient Hospital Stay (HOSPITAL_COMMUNITY): Admission: RE | Admit: 2009-04-15 | Discharge: 2009-04-17 | Payer: Self-pay | Admitting: Urology

## 2009-04-15 ENCOUNTER — Encounter (INDEPENDENT_AMBULATORY_CARE_PROVIDER_SITE_OTHER): Payer: Self-pay | Admitting: Urology

## 2009-04-30 ENCOUNTER — Encounter: Payer: Self-pay | Admitting: Family Medicine

## 2009-05-07 ENCOUNTER — Encounter: Payer: Self-pay | Admitting: Family Medicine

## 2009-06-11 ENCOUNTER — Ambulatory Visit: Payer: Self-pay | Admitting: Internal Medicine

## 2009-06-11 LAB — CONVERTED CEMR LAB
ALT: 9 units/L (ref 0–53)
BUN: 10 mg/dL (ref 6–23)
CO2: 24 meq/L (ref 19–32)
Calcium: 9.1 mg/dL (ref 8.4–10.5)
Chloride: 107 meq/L (ref 96–112)
Creatinine, Ser: 1.44 mg/dL (ref 0.40–1.50)
Eosinophils Absolute: 0.1 10*3/uL (ref 0.0–0.7)
Eosinophils Relative: 2 % (ref 0–5)
HCT: 34.5 % — ABNORMAL LOW (ref 39.0–52.0)
HIV 1 RNA Quant: <48 copies/mL (ref <48)
Hemoglobin: 11.5 g/dL — ABNORMAL LOW (ref 13.0–17.0)
Lymphs Abs: 2 10*3/uL (ref 0.7–4.0)
MCV: 110.9 fL — ABNORMAL HIGH (ref >=78.0)
Monocytes Relative: 11 % (ref 3–12)
RBC: 3.11 M/uL — ABNORMAL LOW (ref 4.22–5.81)
WBC: 3.2 10*3/uL — ABNORMAL LOW (ref 4.0–10.5)

## 2009-06-25 ENCOUNTER — Ambulatory Visit: Payer: Self-pay | Admitting: Internal Medicine

## 2009-09-08 ENCOUNTER — Ambulatory Visit: Payer: Self-pay | Admitting: Internal Medicine

## 2009-09-08 LAB — CONVERTED CEMR LAB
ALT: 13 units/L (ref 0–53)
AST: 20 units/L (ref 0–37)
Alkaline Phosphatase: 85 units/L (ref 39–117)
Basophils Absolute: 0 10*3/uL (ref 0.0–0.1)
Basophils Relative: 1 % (ref 0–1)
CO2: 23 meq/L (ref 19–32)
MCHC: 33.5 g/dL (ref 30.0–36.0)
Monocytes Relative: 8 % (ref 3–12)
Neutro Abs: 2.9 10*3/uL (ref 1.7–7.7)
Neutrophils Relative %: 48 % (ref 43–77)
Platelets: 228 10*3/uL (ref 150–400)
RBC: 3.09 M/uL — ABNORMAL LOW (ref 4.22–5.81)
Sodium: 141 meq/L (ref 135–145)
Total Bilirubin: 0.3 mg/dL (ref 0.3–1.2)
Total Protein: 7.8 g/dL (ref 6.0–8.3)

## 2009-09-22 ENCOUNTER — Ambulatory Visit: Payer: Self-pay | Admitting: Internal Medicine

## 2009-10-13 ENCOUNTER — Encounter: Payer: Self-pay | Admitting: Family Medicine

## 2009-10-13 ENCOUNTER — Ambulatory Visit: Payer: Self-pay | Admitting: Family Medicine

## 2009-10-13 DIAGNOSIS — L820 Inflamed seborrheic keratosis: Secondary | ICD-10-CM | POA: Insufficient documentation

## 2009-10-14 ENCOUNTER — Encounter: Payer: Self-pay | Admitting: Family Medicine

## 2009-10-14 LAB — CONVERTED CEMR LAB
HDL: 29 mg/dL — ABNORMAL LOW (ref >39)
LDL Cholesterol: 83 mg/dL (ref 0–99)
Total CHOL/HDL Ratio: 4.7
Triglycerides: 119 mg/dL (ref <150)

## 2009-10-15 ENCOUNTER — Telehealth: Payer: Self-pay | Admitting: Family Medicine

## 2009-10-31 ENCOUNTER — Encounter: Payer: Self-pay | Admitting: Family Medicine

## 2009-12-22 ENCOUNTER — Ambulatory Visit: Payer: Self-pay | Admitting: Internal Medicine

## 2009-12-22 LAB — CONVERTED CEMR LAB
ALT: 13 units/L (ref 0–53)
Albumin: 4 g/dL (ref 3.5–5.2)
Alkaline Phosphatase: 84 units/L (ref 39–117)
Basophils Absolute: 0 10*3/uL (ref 0.0–0.1)
CO2: 27 meq/L (ref 19–32)
Eosinophils Absolute: 0.1 10*3/uL (ref 0.0–0.7)
Glucose, Bld: 97 mg/dL (ref 70–99)
HIV 1 RNA Quant: <20 copies/mL (ref <20)
HIV-1 RNA Quant, Log: <1.3 (ref <1.30)
LDL Cholesterol: 74 mg/dL (ref 0–99)
Lymphocytes Relative: 44 % (ref 12–46)
Lymphs Abs: 1.8 10*3/uL (ref 0.7–4.0)
Neutrophils Relative %: 44 % (ref 43–77)
Platelets: 217 10*3/uL (ref 150–400)
Potassium: 4.3 meq/L (ref 3.5–5.3)
RDW: 13.5 % (ref 11.5–15.5)
Sodium: 142 meq/L (ref 135–145)
Total Bilirubin: 0.2 mg/dL — ABNORMAL LOW (ref 0.3–1.2)
Total Protein: 7.8 g/dL (ref 6.0–8.3)
Triglycerides: 103 mg/dL (ref <150)
VLDL: 21 mg/dL (ref 0–40)
WBC: 4.1 10*3/uL (ref 4.0–10.5)

## 2010-01-05 ENCOUNTER — Ambulatory Visit: Payer: Self-pay | Admitting: Internal Medicine

## 2010-01-12 ENCOUNTER — Encounter: Payer: Self-pay | Admitting: Family Medicine

## 2010-01-12 DIAGNOSIS — N185 Chronic kidney disease, stage 5: Secondary | ICD-10-CM | POA: Insufficient documentation

## 2010-04-05 ENCOUNTER — Encounter: Payer: Self-pay | Admitting: Internal Medicine

## 2010-04-05 ENCOUNTER — Ambulatory Visit
Admission: RE | Admit: 2010-04-05 | Discharge: 2010-04-05 | Payer: Self-pay | Source: Home / Self Care | Attending: Internal Medicine | Admitting: Internal Medicine

## 2010-04-05 LAB — CONVERTED CEMR LAB
Alkaline Phosphatase: 86 units/L (ref 39–117)
BUN: 14 mg/dL (ref 6–23)
Basophils Relative: 0 % (ref 0–1)
Creatinine, Ser: 1.1 mg/dL (ref 0.40–1.50)
Eosinophils Absolute: 0.1 10*3/uL (ref 0.0–0.7)
Glucose, Bld: 104 mg/dL — ABNORMAL HIGH (ref 70–99)
HCT: 35.8 % — ABNORMAL LOW (ref 39.0–52.0)
Hemoglobin: 11.7 g/dL — ABNORMAL LOW (ref 13.0–17.0)
MCHC: 32.7 g/dL (ref 30.0–36.0)
MCV: 113.7 fL — ABNORMAL HIGH (ref 78.0–100.0)
Monocytes Absolute: 0.5 10*3/uL (ref 0.1–1.0)
Monocytes Relative: 9 % (ref 3–12)
Neutro Abs: 3.3 10*3/uL (ref 1.7–7.7)
RBC: 3.15 M/uL — ABNORMAL LOW (ref 4.22–5.81)
Total Bilirubin: 0.5 mg/dL (ref 0.3–1.2)

## 2010-04-20 ENCOUNTER — Ambulatory Visit
Admission: RE | Admit: 2010-04-20 | Discharge: 2010-04-20 | Payer: Self-pay | Source: Home / Self Care | Attending: Internal Medicine | Admitting: Internal Medicine

## 2010-04-28 ENCOUNTER — Encounter (INDEPENDENT_AMBULATORY_CARE_PROVIDER_SITE_OTHER): Payer: Self-pay | Admitting: *Deleted

## 2010-05-03 NOTE — Consult Note (Signed)
Summary: [facility]   Imported By: Raymond Gurney 05/14/2009 10:50:56  _____________________________________________________________________  External Attachment:    Type:   Image     Comment:   External Document

## 2010-05-03 NOTE — Miscellaneous (Signed)
Summary: Consent Shave Biopsy  Consent Shave Biopsy   Imported By: Raymond Gurney 10/29/2009 13:37:02  _____________________________________________________________________  External Attachment:    Type:   Image     Comment:   External Document

## 2010-05-03 NOTE — Miscellaneous (Signed)
Summary: Orders Update  Clinical Lists Changes  Orders: Added new Service order of Shave Skin Lesion < 0.5 cm/trunk/arm/leg (11300) - Signed

## 2010-05-03 NOTE — Letter (Signed)
Summary: Lipid Letter  [facility]  [address]   [place], [place] [address]   Phone: [phone]  Fax: [phone]    10/14/2009  Lear Ng [address] # [place],   [address]  Dear Kenneth [name]:  We have carefully reviewed your last lipid profile from 10/13/2009 and the results are noted below with a summary of recommendations for lipid management.    Cholesterol:       136     Goal: < 200   HDL "good" Cholesterol:   29     Goal: > 35   LDL "bad" Cholesterol:   83     Goal: < 100   Triglycerides:       119     Goal: < 150    No change to your Lipitor dosing, at your next visit we can discuss the use of Niaspan to help raise your good cholesterol. While your values are slightly higher than previous, your cholesterol is well controlled.    Current Medications: 1)    Lipitor 40 Mg Tabs (Atorvastatin calcium) .... Take 1/2 tablet by mouth once a day 2)    Sustiva 600 Mg Tabs (Efavirenz) .... One tab by mouth qhs 3)    Combivir 150-300 Mg Tabs (Lamivudine-zidovudine) .... One tab by mouth bid  If you have any questions, please call. We appreciate being able to work with you.   Sincerely,    [name] Kenneth Blackbird MD  Appended Document: Lipid Letter mailed

## 2010-05-03 NOTE — Assessment & Plan Note (Signed)
Summary: 73MONTH F/U/VS   Primary Provider:  Vic Blackbird MD  CC:  follow-up visit and lab results.  History of Present Illness: Pt has been having some problems sleeping. he was wondering if the Sustiva can affect his sleep. No missed doses of his HIV meds.  Preventive Screening-Counseling & Management  Alcohol-Tobacco     Alcohol drinks/day: 0     Smoking Status: never     Tobacco Counseling: not indicated; no tobacco use  Caffeine-Diet-Exercise     Caffeine use/day: tea and sodas occassionally     Does Patient Exercise: yes     Type of exercise: walking     Exercise (avg: min/session): 30-60     Times/week: 5  Hep-HIV-STD-Contraception     HIV Risk: risk noted  Safety-Violence-Falls     Seat Belt Use: yes      Sexual History:  n/a.        Drug Use:  never.    Comments: pt. declined condoms   Updated Prior Medication List: LIPITOR 40 MG TABS (ATORVASTATIN CALCIUM) Take 1/2 tablet by mouth once a day SUSTIVA 600 MG TABS (EFAVIRENZ) one tab by mouth qhs COMBIVIR 150-300 MG TABS (LAMIVUDINE-ZIDOVUDINE) one tab by mouth bid  Current Allergies (reviewed today): ! 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 [name]- GI Dr. Casilda Carls- ID Dr. Alinda Money- Urology  Review of Systems  The patient denies anorexia, fever, and weight loss.    Vital Signs:  Patient profile:   55 year old male Height:      71 inches (180.34 cm) Weight:      151.8 pounds (69 kg) BMI:     21.25 Temp:     98.1 degrees F (36.72 degrees C) oral Pulse rate:   50 / minute BP sitting:   158 / 86  (right arm)  Vitals Entered By: Myrtis Hopping CMA Deborra Medina) (January 05, 2010 10:02  AM) CC: follow-up visit, lab results Is Patient Diabetic? No Pain Assessment Patient in pain? no      Nutritional Status BMI of 19 -24 = normal Nutritional Status Detail appetite "fine"  Have you ever been in a relationship where you felt threatened, hurt or afraid?No   Does patient need assistance? Functional Status Self care Ambulation Normal Comments no missed doses of meds per pt.   Physical Exam  General:  alert, well-developed, well-nourished, and well-hydrated.   Head:  normocephalic and atraumatic.   Mouth:  pharynx pink and moist.   Lungs:  normal breath sounds.     Impression & Recommendations:  Problem # 1:  HIV INFECTION (ICD-042) Pt.s most recent CD4ct was 350 and VL <20 .  Pt instructed to continue the current antiretroviral regimen.  Pt encouraged to take medication regularly and not miss doses.  Pt will f/u in 3 months for repeat blood work and will see me 2 weeks later.  Pt refuses Influenza vaccine.  Diagnostics Reviewed:  HIV: CDC-defined AIDS (03/12/2009)   HIV-Western blot: Positive (08/16/2006)   CD4: 350 (12/23/2009)   WBC: 4.1 (12/22/2009)   Hgb: 11.4 (12/22/2009)   HCT: 35.4 (12/22/2009)   Platelets: 217 (12/22/2009) HIV-1 RNA: <20 copies/mL (12/22/2009)   HBSAg: Negative (03/20/2007)  Orders: Est. Patient Level III (  99213)Future Orders: T-CD4SP ([facility]) (CD4SP) ... 04/05/2010 T-HIV Viral Load [id number]) ... 04/05/2010 T-Comprehensive Metabolic Panel ([id number]) ... 04/05/2010 T-CBC w/Diff [id number]) ... 04/05/2010  Patient Instructions: 1)  Please schedule a follow-up appointment in 3 months, 2 weeks after labs.      Not Administered:    Influenza Vaccine not given due to: declined

## 2010-05-03 NOTE — Assessment & Plan Note (Signed)
Summary: f/up,tcb   Vital Signs:  Patient profile:   55 year old male Weight:      146.5 pounds BMI:     20.51 Temp:     98.3 degrees F oral Pulse rate:   62 / minute BP sitting:   144 / 84  (left arm) Cuff size:   small  Vitals Entered By: Enid Skeens, CMA (October 13, 2009 1:49 PM)  Serial Vital Signs/Assessments:  Time      Position  BP       Pulse  Resp  Temp     By                     126/76                         Vic Blackbird MD  CC: Routine f/u, insomina, spot on back Is Patient Diabetic? No Pain Assessment Patient in pain? no         Primary Care Provider:  Vic Blackbird MD  CC:  Routine f/u, insomina, and spot on back.  History of Present Illness:   HIV- pt seen by ID, no change to meds, RNA quant < 48, reviewed labs with pt. No recent illness, has lost 6lbs since last visit, but weight in general improved  Insomnia- continues to have same problems with sleep sleep 3 hours and wake up, not using any OTC sleep aids, ambien and trazadone did not help. Watching his caffiene intake, does not watch TV at bedtime. Had restful nights during recent vacation  No longer following up with Urology  [place] on upper back that has noticed growing in size and slightly scaley, denies pain    Habits & Providers  Alcohol-Tobacco-Diet     Tobacco Status: never  Current Medications (verified): 1)  Lipitor 40 Mg Tabs (Atorvastatin Calcium) .... Take 1/2 Tablet By Mouth Once A Day 2)  Sustiva 600 Mg Tabs (Efavirenz) .... One Tab By Mouth Qhs 3)  Combivir 150-300 Mg Tabs (Lamivudine-Zidovudine) .... One Tab By Mouth Bid  Allergies (verified): 1)  ! Amoxicillin  Physical Exam  General:  NAD, thin male. vitals reviewed.   Mouth:  No oral lesions Lungs:  Normal respiratory effort, chest expands symmetrically. Lungs are clear to auscultation, no crackles or wheezes. Heart:  RRR, no murmur Skin:  Left back-upper shoulder 46mm circular hyperpigmented scaly lesion, NT, no  erythema   Impression & Recommendations:  Problem # 1:  SEBORRHEIC KERATOSIS, INFLAMED (ICD-702.11) Assessment New  Will send for pathology, removed during office - Shave biopsy done  Procedure: - Pt consented, procedure described, questions answered Antiseptic- ETOH Shave done Alumnium chloride applied- good hemostasis, minimal blood loss Pt tolerated procedure well   Orders: [facility]- Est  Level 4 (99214) Punch Biopsy- [facility] (11100)  Problem # 2:  INSOMNIA NOS (ICD-780.52) Assessment: Unchanged  Pt does not want any meds at this time. Will call if he wants to try Lunesta Sleep has improved since he had 1 week off for vacation  Orders: [facility]- Est  Level 4 [id number])  Problem # 3:  HYPERLIPIDEMIA (ICD-272.4) Assessment: Unchanged  His updated medication list for this problem includes:    Lipitor 40 Mg Tabs (Atorvastatin calcium) .Marland Kitchen... Take 1/2 tablet by mouth once a day  Orders: Lipid-[facility] [id number]) [place]- Est  Level 4 [id number])  Problem # 4:  HYPERTENSION, BENIGN SYSTEMIC (ICD-401.1) Assessment: Unchanged  Stable off meds  Orders: [facility]- Est  Level  4 [id number])  Complete Medication List: 1)  Lipitor 40 Mg Tabs (Atorvastatin calcium) .... Take 1/2 tablet by mouth once a day 2)  Sustiva 600 Mg Tabs (Efavirenz) .... One tab by mouth qhs 3)  Combivir 150-300 Mg Tabs (Lamivudine-zidovudine) .... One tab by mouth bid  Patient Instructions: 1)  I will send a letter with your cholesterol results 2)  I will call you with pathology results from today removal in 1 week 3)  Next visit in 6 months if you are doing well   Prevention & Chronic Care Immunizations   Influenza vaccine: Fluvax 3+  (01/22/2008)   Influenza vaccine due: 01/21/2009    Tetanus booster: 11/01/2004: Done.   Tetanus booster due: 11/02/2014    Pneumococcal vaccine: Historical  (08/31/2006)   Pneumococcal vaccine due: None  Colorectal Screening   Hemoccult: normal  (04/13/2009)   Hemoccult due: Not  Indicated    Colonoscopy: internal hemorrhoids  (06/27/2006)   Colonoscopy due: 06/26/2016  Other Screening   PSA: 0.43  (05/06/2007)   PSA due due: 05/2008   Smoking status: never  (10/13/2009)  Lipids   Total Cholesterol: 92  (07/16/2008)   LDL: 56  (07/16/2008)   LDL Direct: 65  (07/24/2006)   HDL: 26  (07/16/2008)   Triglycerides: 50  (07/16/2008)    SGOT (AST): 20  (09/08/2009)   SGPT (ALT): 13  (09/08/2009)   Alkaline phosphatase: 85  (09/08/2009)   Total bilirubin: 0.3  (09/08/2009)  Hypertension   Last Blood Pressure: 144 / 84  (10/13/2009)   Serum creatinine: 1.23  (09/08/2009)   Serum potassium 4.6  (09/08/2009)  Self-Management Support :   Personal Goals (by the next clinic visit) :      Personal blood pressure goal: 140/90  (01/01/2009)     Personal LDL goal: 130  (01/01/2009)    Hypertension self-management support: Not documented    Lipid self-management support: Not documented

## 2010-05-03 NOTE — Miscellaneous (Signed)
Summary: Orders Update  Clinical Lists Changes  Orders: Added new Test order of Provider Misc Charge- [facility] ([place]) - Signed

## 2010-05-03 NOTE — Miscellaneous (Signed)
  Clinical Lists Changes  Problems: Added new problem of RENAL DISEASE, CHRONIC, STAGE II (ICD-585.2) Observations: Added new observation of PAST MED HX: Achalasia s/p lap myotomy & fundoplication (123XX123) H/o gastritis Severe right renal hydronephrosis from ?congenital, UPJ stenosis; so a solitary functioning L kidney HIV (dx 4/08) - started on therapy Oct 2010 Hydronephrosis with renal abscess  Oct 2010 requring nephrostomy  CKD Stage II baseline 1.2-1.3  Dr. [name]- GI Dr. Casilda Carls- ID Dr. 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 [name]- GI    Dr. Casilda Carls- ID    Dr. Alinda Money- Urology

## 2010-05-03 NOTE — Miscellaneous (Signed)
Summary: refill on Lipitor  Clinical Lists Changes  received call from [facility] in [place], [place] requesting refill on Lipitor. Our records states take 1/2 tab daily of the 40 mg Lipitor .  their records state take one tab daily. advised will send message to MD to advise about refill. call back [phone]. Marcell Barlow RN  April 30, 2009 1:58 PM   Pt is now on Lipitor 20mg , so the sig should say Lipitor 40mg   take 1/2 tab daily, because of interaction with other meds, this was changed in Aug 2010. You may refill this.      Rx called in with directions of   Lipitor 40 mg take 1/2 tab daily. Marcell Barlow RN  May 04, 2009 9:55 AM

## 2010-05-03 NOTE — Letter (Signed)
Summary: FMLA  FMLA   Imported By: Audie Clear 04/13/2009 12:21:25  _____________________________________________________________________  External Attachment:    Type:   Image     Comment:   External Document

## 2010-05-03 NOTE — Assessment & Plan Note (Signed)
Summary: 6month f/u [mkj]   Primary Provider:  Vic Blackbird MD  CC:  follow-up visit and lab results.  History of Present Illness: Pt feels well.  No new complaints.  No missed doses of his HIV meds.  Preventive Screening-Counseling & Management  Alcohol-Tobacco     Alcohol drinks/day: 0     Smoking Status: never     Tobacco Counseling: not indicated; no tobacco use  Caffeine-Diet-Exercise     Caffeine use/day: tea and sodas occassionally     Does Patient Exercise: yes     Type of exercise: walking     Exercise (avg: min/session): 30-60     Times/week: 5  Hep-HIV-STD-Contraception     HIV Risk: no risk noted  Safety-Violence-Falls     Seat Belt Use: yes      Sexual History:  n/a.        Drug Use:  never.    Comments: pt. declined condoms   Updated Prior Medication List: LIPITOR 40 MG TABS (ATORVASTATIN CALCIUM) Take 1/2 tablet by mouth once a day SUSTIVA 600 MG TABS (EFAVIRENZ) one tab by mouth qhs COMBIVIR 150-300 MG TABS (LAMIVUDINE-ZIDOVUDINE) one tab by mouth bid  Current Allergies (reviewed today): ! 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 [name]- GI Dr. Casilda Carls- ID Dr. Alinda Money- Urology  Review of Systems  The patient denies anorexia, fever, weight loss, chest pain, and dyspnea on exertion.    Vital Signs:  Patient profile:   55 year old male Height:      71 inches (180.34 cm) Weight:      150.12 pounds (68.24 kg) BMI:     21.01 Temp:     97.9 degrees F (36.61 degrees C) oral Pulse rate:   59 / minute BP sitting:   144 / 83  (right arm)  Vitals Entered By: Myrtis Hopping CMA ( [place]) (September 22, 2009 10:00 AM) CC: follow-up  visit, lab results Is Patient Diabetic? No Pain Assessment Patient in pain? no      Nutritional Status BMI of 19 -24 = normal Nutritional Status Detail appetite "ok"  Have you ever been in a relationship where you felt threatened, hurt or afraid?No   Does patient need assistance? Functional Status Self care Ambulation Normal Comments no missed doses of meds per patient   Physical Exam  General:  alert, well-developed, well-nourished, and well-hydrated.   Head:  normocephalic and atraumatic.   Mouth:  pharynx pink and moist.   Lungs:  normal breath sounds.      Impression & Recommendations:  Problem # 1:  HIV INFECTION (ICD-042) Pt.s most recent CD4ct was 520 and VL<48  .  Pt instructed to continue the current antiretroviral regimen.  Pt encouraged to take medication regularly and not miss doses.  Pt will f/u in 3 months for repeat blood work and will see me 2 weeks later.  Diagnostics Reviewed:  HIV: CDC-defined AIDS (03/12/2009)   HIV-Western blot: Positive (08/16/2006)   CD4: 520 (09/09/2009)   WBC: 6.0 (09/08/2009)   Hgb: 11.4 (09/08/2009)   HCT: 34.0 (09/08/2009)   Platelets: 228 (09/08/2009) HIV-1 RNA: <48 copies/mL (09/08/2009)   HBSAg: Negative (03/20/2007)  Orders: Est. Patient Level III (99213)Future Orders: T-CD4SP ([facility]) (CD4SP) .Marland KitchenMarland Kitchen  12/21/2009 T-HIV Viral Load [phone]) ... 12/21/2009 T-Comprehensive Metabolic Panel ([id number]) ... 12/21/2009 T-CBC w/Diff [id number]) ... 12/21/2009 T-Lipid Profile [phone]) ... 12/21/2009 T-RPR (Syphilis) [phone]) ... 12/21/2009  Patient Instructions: 1)  Please schedule a follow-up appointment in 3 months, 2 weeks after labs.

## 2010-05-03 NOTE — Progress Notes (Signed)
  Phone Note Outgoing Call   Call placed by: Vic Blackbird MD,  October 15, 2009 11:13 AM Details for Reason: Pathology results Summary of Call: Seborrheic keratosis, benign growth explained to pt, can remove if they become inflammed or irritated, otherwise they are cosmetic--pt voices understanding

## 2010-05-03 NOTE — Miscellaneous (Signed)
Summary: FMLA  Patient dropped off FMLA papers to be filled out.  Please mail when completed. Beryle Lathe  April 06, 2009 2:36 PM  forms to pcp.Elige Radon RN  April 06, 2009 2:48 PM  Forms completed

## 2010-05-03 NOTE — Assessment & Plan Note (Signed)
Summary: CHECKUP/SB.   Primary Provider:  Vic Blackbird MD  CC:  follow-up visit and lab results.  History of Present Illness: Pt s/p removal of his non-functioning kidney.  He is feeling much better.  He has not missed any doses of his HIV meds. No new problems.  Preventive Screening-Counseling & Management  Alcohol-Tobacco     Alcohol drinks/day: 0     Smoking Status: never     Tobacco Counseling: not indicated; no tobacco use  Caffeine-Diet-Exercise     Caffeine use/day: tea and sodas occassionally     Does Patient Exercise: yes     Type of exercise: walking     Exercise (avg: min/session): 30-60     Times/week: 5  Hep-HIV-STD-Contraception     HIV Risk: no risk noted  Safety-Violence-Falls     Seat Belt Use: yes      Sexual History:  n/a.        Drug Use:  never.     Updated Prior Medication List: LIPITOR 40 MG TABS (ATORVASTATIN CALCIUM) Take 1/2 tablet by mouth once a day SUSTIVA 600 MG TABS (EFAVIRENZ) one tab by mouth qhs COMBIVIR 150-300 MG TABS (LAMIVUDINE-ZIDOVUDINE) one tab by mouth bid  Current Allergies (reviewed today): ! 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 [name]- GI Dr. Casilda Carls- ID Dr. Alinda Money- Urology  Review of Systems  The patient denies anorexia, fever, and weight loss.    Vital Signs:  Patient profile:   55 year old male Height:      71 inches (180.34 cm) Weight:      157.8 pounds (71.73 kg) BMI:     22.09 Pulse rate:   50 / minute BP sitting:   130 / 79  (left arm)  Vitals Entered By: Myrtis Hopping CMA Deborra Medina) (June 25, 2009 9:45 AM) CC: follow-up visit, lab results Is Patient Diabetic? No Pain  Assessment Patient in pain? no      Nutritional Status BMI of 19 -24 = normal Nutritional Status Detail appetite "good"  Does patient need assistance? Functional Status Self care Ambulation Normal Comments no missed doses of meds per patient   Physical Exam  General:  alert, well-developed, well-nourished, and well-hydrated.   Head:  normocephalic and atraumatic.   Lungs:  normal breath sounds.      Impression & Recommendations:  Problem # 1:  HIV INFECTION (ICD-042) Pt.s most recent CD4ct was  340 and VL <48 .  Pt instructed to continue the current antiretroviral regimen.  Pt encouraged to take medication regularly and not miss doses.  Pt will f/u in 3 months for repeat blood work and will see me 2 weeks later. Will stop his PCP prohylaxis.  The following medications were removed from the medication list:    Bactrim Ds 800-160 Mg Tabs (Sulfamethoxazole-trimethoprim) ..... One tab by mouth daily  Diagnostics Reviewed:  HIV: CDC-defined AIDS (03/12/2009)   HIV-Western blot: Positive (08/16/2006)   CD4: 340 (06/11/2009)   WBC: 3.2 (06/11/2009)   Hgb: 11.5 (06/11/2009)   HCT: 34.5 (06/11/2009)   Platelets: 179 (06/11/2009) HIV-1 RNA: <48 copies/mL (06/11/2009)   HBSAg: Negative (03/20/2007)  Orders: Est. Patient Level III (99213)Future Orders: T-CD4SP ([facility]) (CD4SP) ... 09/23/2009 T-HIV Viral Load [phone]) .Marland KitchenMarland Kitchen  09/23/2009 T-Comprehensive Metabolic Panel ([id number]) ... 09/23/2009 T-CBC w/Diff [id number]) ... 09/23/2009  Patient Instructions: 1)  Please schedule a follow-up appointment in 3 months, 2 weeks after labs.  Process Orders Check Orders Results:     [facility]: [id number] not required for this insurance Tests Sent for requisitioning (June 25, 2009 9:59 AM):     09/23/2009: [facility] -- T-HIV Viral Load [phone] (signed)     09/23/2009: [facility] -- T-Comprehensive Metabolic Panel [id number]  (signed)     09/23/2009: [facility] -- [facility] w/Diff [id number] (signed)

## 2010-05-05 NOTE — Assessment & Plan Note (Signed)
Summary: 38month f/u [mkj]   Primary Provider:  Vic Blackbird MD  CC:  follow-up visit and lab results.  History of Present Illness: patient here for lab results.  He is doing well no missed doses of his HIV medications.  Preventive Screening-Counseling & Management  Alcohol-Tobacco     Alcohol drinks/day: 0     Smoking Status: never     Tobacco Counseling: not indicated; no tobacco use  Caffeine-Diet-Exercise     Caffeine use/day: tea and sodas occassionally     Does Patient Exercise: yes     Type of exercise: walking     Exercise (avg: min/session): 30-60     Times/week: 5  Hep-HIV-STD-Contraception     HIV Risk: no risk noted  Safety-Violence-Falls     Seat Belt Use: yes      Sexual History:  n/a.        Drug Use:  never.    Comments: pt. declined condoms   Current Allergies (reviewed today): ! 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 [name]- GI Dr. Casilda Carls- ID Dr. Alinda Money- Urology  Review of Systems  The patient denies anorexia, fever, and weight loss.    Vital Signs:  Patient profile:   55 year old male Height:      71 inches (180.34 cm) Weight:      148.8 pounds (67.64 kg) BMI:     20.83 Temp:     98.1 degrees F (36.72 degrees C) oral Pulse rate:   59 / minute BP sitting:   128 / 78  (left arm)  Vitals Entered By: Myrtis Hopping CMA ( [place]) (April 20, 2010 10:14 AM) CC: follow-up visit, lab results Is Patient Diabetic? No Pain Assessment Patient in pain? no      Nutritional Status BMI of 19 -24 = normal Nutritional Status Detail appetite "normal"  Have you ever been in a relationship where  you felt threatened, hurt or afraid?No   Does patient need assistance? Functional Status Self care Ambulation Normal Comments no missed doses of meds per pt.   Physical Exam  General:  alert, well-developed, well-nourished, and well-hydrated.   Head:  normocephalic and atraumatic.   Mouth:  pharynx pink and moist.   Lungs:  normal breath sounds.     Impression & Recommendations:  Problem # 1:  HIV INFECTION (ICD-042) Pt.s most recent CD4ct was 310 and VL <20 .  Pt instructed to continue the current antiretroviral regimen.  Pt encouraged to take medication regularly and not miss doses.  Pt will f/u in 3 months for repeat blood work and will see me 2 weeks later.  Diagnostics Reviewed:  HIV: CDC-defined AIDS (03/12/2009)   HIV-Western blot: Positive (08/16/2006)   CD4: 310 (04/06/2010)   WBC: 5.5 (04/05/2010)   Hgb: 11.7 (04/05/2010)   HCT: 35.8 (04/05/2010)   Platelets: 230 (04/05/2010) HIV-1 RNA: <20 copies/mL (04/05/2010)   HBSAg: Negative (03/20/2007)  Orders: Est. Patient Level III [id number]) Est. Patient Level III (99213)Future Orders: T-CD4SP ([facility]) (CD4SP) ... 07/19/2010 T-HIV Viral Load [phone]) ... 07/19/2010 T-Comprehensive Metabolic Panel ([id number]) ... 07/19/2010 T-CBC w/Diff [id number]) ... 07/19/2010 T-Lipid Profile [phone]) ... 07/19/2010  Patient Instructions: 1)  Please schedule a follow-up appointment  in 3 month, 2 weeks after labs.

## 2010-05-05 NOTE — Miscellaneous (Signed)
  Clinical Lists Changes  Observations: Added new observation of INCOMESOURCE: UNKNOWN (04/28/2010 12:06) Added new observation of HOUSEINCOME: 0  (04/28/2010 12:06) Added new observation of #CHILD<18 IN: Unknown  (04/28/2010 12:06) Added new observation of FAMILYSIZE: 0  (04/28/2010 12:06) Added new observation of HOUSING: Unknown  (04/28/2010 12:06) Added new observation of YEARLYEXPEN: 0  (04/28/2010 12:06)

## 2010-06-13 LAB — T-HELPER CELL (CD4) - (RCID CLINIC ONLY)
CD4 % Helper T Cell: 19 % — ABNORMAL LOW (ref 33–55)
CD4 T Cell Abs: 310 uL — ABNORMAL LOW (ref 400–2700)

## 2010-06-16 LAB — T-HELPER CELL (CD4) - (RCID CLINIC ONLY): CD4 T Cell Abs: 350 uL — ABNORMAL LOW (ref 400–2700)

## 2010-06-19 LAB — BASIC METABOLIC PANEL
BUN: 13 mg/dL (ref 6–23)
BUN: 9 mg/dL (ref 6–23)
CO2: 25 mEq/L (ref 19–32)
Calcium: 8.2 mg/dL — ABNORMAL LOW (ref 8.4–10.5)
Chloride: 103 mEq/L (ref 96–112)
Chloride: 107 mEq/L (ref 96–112)
Creatinine, Ser: 1.46 mg/dL (ref 0.4–1.5)
GFR calc Af Amer: 51 mL/min — ABNORMAL LOW (ref >60)
GFR calc Af Amer: >60 mL/min (ref >60)
GFR calc non Af Amer: 42 mL/min — ABNORMAL LOW (ref >60)
GFR calc non Af Amer: 48 mL/min — ABNORMAL LOW (ref >60)
GFR calc non Af Amer: 59 mL/min — ABNORMAL LOW (ref >60)
Glucose, Bld: 85 mg/dL (ref 70–99)
Glucose, Bld: 99 mg/dL (ref 70–99)
Potassium: 4.1 mEq/L (ref 3.5–5.1)
Potassium: 4.1 mEq/L (ref 3.5–5.1)
Potassium: 4.9 mEq/L (ref 3.5–5.1)
Potassium: 5 mEq/L (ref 3.5–5.1)
Sodium: 134 mEq/L — ABNORMAL LOW (ref 135–145)
Sodium: 134 mEq/L — ABNORMAL LOW (ref 135–145)
Sodium: 138 mEq/L (ref 135–145)

## 2010-06-19 LAB — HEMOGLOBIN AND HEMATOCRIT, BLOOD
HCT: 29.7 % — ABNORMAL LOW (ref 39.0–52.0)
Hemoglobin: 9.7 g/dL — ABNORMAL LOW (ref 13.0–17.0)

## 2010-06-19 LAB — CBC
HCT: 34 % — ABNORMAL LOW (ref 39.0–52.0)
Hemoglobin: 11.5 g/dL — ABNORMAL LOW (ref 13.0–17.0)
Platelets: 182 10*3/uL (ref 150–400)
RDW: 21.6 % — ABNORMAL HIGH (ref 11.5–15.5)
WBC: 3.4 10*3/uL — ABNORMAL LOW (ref 4.0–10.5)

## 2010-06-19 LAB — TYPE AND SCREEN: ABO/RH(D): O POS

## 2010-06-20 LAB — T-HELPER CELL (CD4) - (RCID CLINIC ONLY)
CD4 % Helper T Cell: 21 % — ABNORMAL LOW (ref 33–55)
CD4 T Cell Abs: 520 uL (ref 400–2700)

## 2010-06-26 LAB — T-HELPER CELL (CD4) - (RCID CLINIC ONLY)
CD4 % Helper T Cell: 18 % — ABNORMAL LOW (ref 33–55)
CD4 T Cell Abs: 340 uL — ABNORMAL LOW (ref 400–2700)

## 2010-07-07 LAB — URINE CULTURE
Colony Count: NO GROWTH
Culture: NO GROWTH

## 2010-07-07 LAB — CBC
HCT: 24.9 % — ABNORMAL LOW (ref 39.0–52.0)
HCT: 24.9 % — ABNORMAL LOW (ref 39.0–52.0)
HCT: 25.3 % — ABNORMAL LOW (ref 39.0–52.0)
HCT: 25.4 % — ABNORMAL LOW (ref 39.0–52.0)
HCT: 27.5 % — ABNORMAL LOW (ref 39.0–52.0)
HCT: 27.8 % — ABNORMAL LOW (ref 39.0–52.0)
HCT: 28.3 % — ABNORMAL LOW (ref 39.0–52.0)
HCT: 28.9 % — ABNORMAL LOW (ref 39.0–52.0)
HCT: 33 % — ABNORMAL LOW (ref 39.0–52.0)
Hemoglobin: 8.5 g/dL — ABNORMAL LOW (ref 13.0–17.0)
Hemoglobin: 8.5 g/dL — ABNORMAL LOW (ref 13.0–17.0)
Hemoglobin: 8.6 g/dL — ABNORMAL LOW (ref 13.0–17.0)
Hemoglobin: 9.3 g/dL — ABNORMAL LOW (ref 13.0–17.0)
Hemoglobin: 9.6 g/dL — ABNORMAL LOW (ref 13.0–17.0)
Hemoglobin: 9.6 g/dL — ABNORMAL LOW (ref 13.0–17.0)
Hemoglobin: 9.8 g/dL — ABNORMAL LOW (ref 13.0–17.0)
MCHC: 33.7 g/dL (ref 30.0–36.0)
MCHC: 33.7 g/dL (ref 30.0–36.0)
MCHC: 33.8 g/dL (ref 30.0–36.0)
MCHC: 33.9 g/dL (ref 30.0–36.0)
MCHC: 33.9 g/dL (ref 30.0–36.0)
MCHC: 34.1 g/dL (ref 30.0–36.0)
MCHC: 34.1 g/dL (ref 30.0–36.0)
MCHC: 34.2 g/dL (ref 30.0–36.0)
MCHC: 34.4 g/dL (ref 30.0–36.0)
MCHC: 34.7 g/dL (ref 30.0–36.0)
MCHC: 34.9 g/dL (ref 30.0–36.0)
MCV: 90.4 fL (ref 78.0–100.0)
MCV: 90.6 fL (ref 78.0–100.0)
MCV: 90.7 fL (ref 78.0–100.0)
MCV: 90.8 fL (ref 78.0–100.0)
MCV: 90.8 fL (ref 78.0–100.0)
MCV: 91.2 fL (ref 78.0–100.0)
MCV: 91.3 fL (ref 78.0–100.0)
MCV: 91.6 fL (ref 78.0–100.0)
MCV: 91.6 fL (ref 78.0–100.0)
Platelets: 128 10*3/uL — ABNORMAL LOW (ref 150–400)
Platelets: 129 10*3/uL — ABNORMAL LOW (ref 150–400)
Platelets: 130 10*3/uL — ABNORMAL LOW (ref 150–400)
Platelets: 135 10*3/uL — ABNORMAL LOW (ref 150–400)
Platelets: 139 10*3/uL — ABNORMAL LOW (ref 150–400)
Platelets: 150 10*3/uL (ref 150–400)
Platelets: 232 10*3/uL (ref 150–400)
Platelets: 236 10*3/uL (ref 150–400)
RBC: 2.73 MIL/uL — ABNORMAL LOW (ref 4.22–5.81)
RBC: 2.75 MIL/uL — ABNORMAL LOW (ref 4.22–5.81)
RBC: 2.77 MIL/uL — ABNORMAL LOW (ref 4.22–5.81)
RBC: 2.79 MIL/uL — ABNORMAL LOW (ref 4.22–5.81)
RBC: 2.89 MIL/uL — ABNORMAL LOW (ref 4.22–5.81)
RBC: 2.91 MIL/uL — ABNORMAL LOW (ref 4.22–5.81)
RBC: 3.04 MIL/uL — ABNORMAL LOW (ref 4.22–5.81)
RBC: 3.05 MIL/uL — ABNORMAL LOW (ref 4.22–5.81)
RBC: 3.13 MIL/uL — ABNORMAL LOW (ref 4.22–5.81)
RBC: 3.17 MIL/uL — ABNORMAL LOW (ref 4.22–5.81)
RDW: 13.9 % (ref 11.5–15.5)
RDW: 14.1 % (ref 11.5–15.5)
RDW: 14.1 % (ref 11.5–15.5)
RDW: 14.2 % (ref 11.5–15.5)
RDW: 14.3 % (ref 11.5–15.5)
RDW: 14.4 % (ref 11.5–15.5)
RDW: 14.5 % (ref 11.5–15.5)
RDW: 14.5 % (ref 11.5–15.5)
RDW: 14.6 % (ref 11.5–15.5)
WBC: 14 10*3/uL — ABNORMAL HIGH (ref 4.0–10.5)
WBC: 18 10*3/uL — ABNORMAL HIGH (ref 4.0–10.5)
WBC: 23.4 10*3/uL — ABNORMAL HIGH (ref 4.0–10.5)
WBC: 6.7 10*3/uL (ref 4.0–10.5)
WBC: 7.6 10*3/uL (ref 4.0–10.5)
WBC: 7.6 10*3/uL (ref 4.0–10.5)

## 2010-07-07 LAB — BASIC METABOLIC PANEL
BUN: 15 mg/dL (ref 6–23)
BUN: 19 mg/dL (ref 6–23)
BUN: 22 mg/dL (ref 6–23)
BUN: 25 mg/dL — ABNORMAL HIGH (ref 6–23)
CO2: 19 mEq/L (ref 19–32)
CO2: 21 mEq/L (ref 19–32)
CO2: 22 mEq/L (ref 19–32)
CO2: 22 mEq/L (ref 19–32)
CO2: 23 mEq/L (ref 19–32)
Calcium: 7.8 mg/dL — ABNORMAL LOW (ref 8.4–10.5)
Calcium: 8.3 mg/dL — ABNORMAL LOW (ref 8.4–10.5)
Calcium: 8.4 mg/dL (ref 8.4–10.5)
Calcium: 8.4 mg/dL (ref 8.4–10.5)
Chloride: 108 mEq/L (ref 96–112)
Chloride: 108 mEq/L (ref 96–112)
Chloride: 108 mEq/L (ref 96–112)
Chloride: 109 mEq/L (ref 96–112)
Chloride: 111 mEq/L (ref 96–112)
Creatinine, Ser: 1.41 mg/dL (ref 0.4–1.5)
Creatinine, Ser: 1.65 mg/dL — ABNORMAL HIGH (ref 0.4–1.5)
Creatinine, Ser: 1.7 mg/dL — ABNORMAL HIGH (ref 0.4–1.5)
Creatinine, Ser: 1.74 mg/dL — ABNORMAL HIGH (ref 0.4–1.5)
Creatinine, Ser: 1.82 mg/dL — ABNORMAL HIGH (ref 0.4–1.5)
GFR calc Af Amer: 48 mL/min — ABNORMAL LOW (ref >60)
GFR calc Af Amer: 50 mL/min — ABNORMAL LOW (ref >60)
GFR calc Af Amer: 52 mL/min — ABNORMAL LOW (ref >60)
GFR calc Af Amer: 53 mL/min — ABNORMAL LOW (ref >60)
GFR calc Af Amer: 58 mL/min — ABNORMAL LOW (ref >60)
GFR calc non Af Amer: 43 mL/min — ABNORMAL LOW (ref >60)
GFR calc non Af Amer: 44 mL/min — ABNORMAL LOW (ref >60)
Glucose, Bld: 103 mg/dL — ABNORMAL HIGH (ref 70–99)
Glucose, Bld: 92 mg/dL (ref 70–99)
Glucose, Bld: 94 mg/dL (ref 70–99)
Potassium: 3.4 mEq/L — ABNORMAL LOW (ref 3.5–5.1)
Potassium: 4.1 mEq/L (ref 3.5–5.1)
Potassium: 4.2 mEq/L (ref 3.5–5.1)
Sodium: 135 mEq/L (ref 135–145)
Sodium: 135 mEq/L (ref 135–145)
Sodium: 136 mEq/L (ref 135–145)
Sodium: 138 mEq/L (ref 135–145)

## 2010-07-07 LAB — AFB CULTURE, BLOOD

## 2010-07-07 LAB — GC/CHLAMYDIA PROBE AMP, URINE
Chlamydia, Swab/Urine, PCR: NEGATIVE
GC Probe Amp, Urine: NEGATIVE

## 2010-07-07 LAB — COMPREHENSIVE METABOLIC PANEL
ALT: 32 U/L (ref 0–53)
AST: 35 U/L (ref 0–37)
Albumin: 2.1 g/dL — ABNORMAL LOW (ref 3.5–5.2)
Albumin: 2.5 g/dL — ABNORMAL LOW (ref 3.5–5.2)
BUN: 67 mg/dL — ABNORMAL HIGH (ref 6–23)
CO2: 22 mEq/L (ref 19–32)
Calcium: 7.5 mg/dL — ABNORMAL LOW (ref 8.4–10.5)
Calcium: 8.4 mg/dL (ref 8.4–10.5)
Creatinine, Ser: 1.5 mg/dL (ref 0.4–1.5)
GFR calc Af Amer: 60 mL/min — ABNORMAL LOW (ref >60)
GFR calc non Af Amer: 49 mL/min — ABNORMAL LOW (ref >60)
Glucose, Bld: 111 mg/dL — ABNORMAL HIGH (ref 70–99)
Potassium: 3.8 mEq/L (ref 3.5–5.1)
Sodium: 136 mEq/L (ref 135–145)
Sodium: 139 mEq/L (ref 135–145)
Total Protein: 6.7 g/dL (ref 6.0–8.3)
Total Protein: 6.8 g/dL (ref 6.0–8.3)

## 2010-07-07 LAB — DIFFERENTIAL
Basophils Absolute: 0 10*3/uL (ref 0.0–0.1)
Basophils Absolute: 0 10*3/uL (ref 0.0–0.1)
Basophils Absolute: 0 10*3/uL (ref 0.0–0.1)
Basophils Relative: 0 % (ref 0–1)
Basophils Relative: 0 % (ref 0–1)
Eosinophils Absolute: 0 10*3/uL (ref 0.0–0.7)
Eosinophils Absolute: 0 10*3/uL (ref 0.0–0.7)
Eosinophils Absolute: 0.1 10*3/uL (ref 0.0–0.7)
Eosinophils Relative: 0 % (ref 0–5)
Eosinophils Relative: 1 % (ref 0–5)
Lymphocytes Relative: 14 % (ref 12–46)
Lymphocytes Relative: 5 % — ABNORMAL LOW (ref 12–46)
Lymphs Abs: 1 10*3/uL (ref 0.7–4.0)
Lymphs Abs: 1 10*3/uL (ref 0.7–4.0)
Lymphs Abs: 1.4 10*3/uL (ref 0.7–4.0)
Monocytes Absolute: 0.7 10*3/uL (ref 0.1–1.0)
Monocytes Absolute: 1.1 10*3/uL — ABNORMAL HIGH (ref 0.1–1.0)
Monocytes Absolute: 1.4 10*3/uL — ABNORMAL HIGH (ref 0.1–1.0)
Monocytes Relative: 10 % (ref 3–12)
Monocytes Relative: 6 % (ref 3–12)
Neutro Abs: 15.8 10*3/uL — ABNORMAL HIGH (ref 1.7–7.7)
Neutro Abs: 5.7 10*3/uL (ref 1.7–7.7)
Neutrophils Relative %: 75 % (ref 43–77)
Neutrophils Relative %: 88 % — ABNORMAL HIGH (ref 43–77)
Neutrophils Relative %: 88 % — ABNORMAL HIGH (ref 43–77)

## 2010-07-07 LAB — CULTURE, BLOOD (ROUTINE X 2): Culture: NO GROWTH

## 2010-07-07 LAB — POCT I-STAT, CHEM 8
BUN: 78 mg/dL — ABNORMAL HIGH (ref 6–23)
Calcium, Ion: 0.98 mmol/L — ABNORMAL LOW (ref 1.12–1.32)
Hemoglobin: 11.9 g/dL — ABNORMAL LOW (ref 13.0–17.0)
Sodium: 137 mEq/L (ref 135–145)
TCO2: 20 mmol/L (ref 0–100)

## 2010-07-07 LAB — RENAL FUNCTION PANEL
CO2: 21 mEq/L (ref 19–32)
Chloride: 108 mEq/L (ref 96–112)
Creatinine, Ser: 4.45 mg/dL — ABNORMAL HIGH (ref 0.4–1.5)
GFR calc Af Amer: 17 mL/min — ABNORMAL LOW (ref >60)
GFR calc non Af Amer: 14 mL/min — ABNORMAL LOW (ref >60)
Glucose, Bld: 148 mg/dL — ABNORMAL HIGH (ref 70–99)

## 2010-07-07 LAB — CRYPTOCOCCAL ANTIGEN: Crypto Ag: NEGATIVE

## 2010-07-07 LAB — HISTOPLASMA ANTIGEN, URINE

## 2010-07-07 LAB — GIARDIA/CRYPTOSPORIDIUM SCREEN(EIA)
Cryptosporidium Screen (EIA): NEGATIVE
Giardia Screen - EIA: NEGATIVE

## 2010-07-07 LAB — VITAMIN B12: Vitamin B-12: 553 pg/mL (ref 211–911)

## 2010-07-07 LAB — AFB CULTURE WITH SMEAR (NOT AT ARMC): Acid Fast Smear: NONE SEEN

## 2010-07-07 LAB — T-HELPER CELLS (CD4) COUNT (NOT AT ARMC)
CD4 % Helper T Cell: 13 % — ABNORMAL LOW (ref 33–55)
CD4 T Cell Abs: 120 uL — ABNORMAL LOW (ref 400–2700)

## 2010-07-07 LAB — CREATININE, URINE, RANDOM: Creatinine, Urine: 101.7 mg/dL

## 2010-07-07 LAB — MISCELLANEOUS TEST

## 2010-07-07 LAB — URINALYSIS, ROUTINE W REFLEX MICROSCOPIC
Glucose, UA: NEGATIVE mg/dL
Nitrite: NEGATIVE
Specific Gravity, Urine: 1.023 (ref 1.005–1.030)
pH: 5 (ref 5.0–8.0)

## 2010-07-07 LAB — HIV-1 GENOTYPR PLUS

## 2010-07-07 LAB — URINE MICROSCOPIC-ADD ON

## 2010-07-07 LAB — ANAEROBIC CULTURE

## 2010-07-07 LAB — CULTURE, ROUTINE-ABSCESS

## 2010-07-07 LAB — IRON AND TIBC
Iron: 39 ug/dL — ABNORMAL LOW (ref 42–135)
Saturation Ratios: 23 % (ref 20–55)
TIBC: 171 ug/dL — ABNORMAL LOW (ref 215–435)
UIBC: 132 ug/dL

## 2010-07-07 LAB — GRAM STAIN

## 2010-07-07 LAB — RETICULOCYTES
RBC.: 3.3 MIL/uL — ABNORMAL LOW (ref 4.22–5.81)
Retic Count, Absolute: 13.2 10*3/uL — ABNORMAL LOW (ref 19.0–186.0)
Retic Ct Pct: 0.4 % (ref 0.4–3.1)

## 2010-07-07 LAB — PROTIME-INR
INR: 1.29 (ref 0.00–1.49)
Prothrombin Time: 16 seconds — ABNORMAL HIGH (ref 11.6–15.2)

## 2010-07-07 LAB — PHOSPHORUS: Phosphorus: 4.9 mg/dL — ABNORMAL HIGH (ref 2.3–4.6)

## 2010-07-07 LAB — STOOL CULTURE

## 2010-07-07 LAB — FOLATE: Folate: 7 ng/mL

## 2010-07-07 LAB — FECAL LACTOFERRIN, QUANT

## 2010-07-07 LAB — SODIUM, URINE, RANDOM: Sodium, Ur: 54 mEq/L

## 2010-07-07 LAB — FUNGUS CULTURE W SMEAR: Fungal Smear: NONE SEEN

## 2010-07-13 LAB — T-HELPER CELL (CD4) - (RCID CLINIC ONLY): CD4 % Helper T Cell: 17 % — ABNORMAL LOW (ref 33–55)

## 2010-07-19 ENCOUNTER — Ambulatory Visit (INDEPENDENT_AMBULATORY_CARE_PROVIDER_SITE_OTHER): Payer: BC Managed Care – PPO | Admitting: Family Medicine

## 2010-07-19 ENCOUNTER — Other Ambulatory Visit: Payer: BLUE CROSS/BLUE SHIELD

## 2010-07-19 ENCOUNTER — Other Ambulatory Visit: Payer: Self-pay | Admitting: Infectious Diseases

## 2010-07-19 ENCOUNTER — Other Ambulatory Visit: Payer: Self-pay | Admitting: Family Medicine

## 2010-07-19 ENCOUNTER — Other Ambulatory Visit (INDEPENDENT_AMBULATORY_CARE_PROVIDER_SITE_OTHER): Payer: BC Managed Care – PPO | Admitting: Adult Health

## 2010-07-19 ENCOUNTER — Encounter: Payer: Self-pay | Admitting: Family Medicine

## 2010-07-19 DIAGNOSIS — B20 Human immunodeficiency virus [HIV] disease: Secondary | ICD-10-CM

## 2010-07-19 DIAGNOSIS — Z113 Encounter for screening for infections with a predominantly sexual mode of transmission: Secondary | ICD-10-CM

## 2010-07-19 DIAGNOSIS — Z79899 Other long term (current) drug therapy: Secondary | ICD-10-CM

## 2010-07-19 DIAGNOSIS — R634 Abnormal weight loss: Secondary | ICD-10-CM

## 2010-07-19 DIAGNOSIS — Z21 Asymptomatic human immunodeficiency virus [HIV] infection status: Secondary | ICD-10-CM

## 2010-07-19 DIAGNOSIS — R1310 Dysphagia, unspecified: Secondary | ICD-10-CM

## 2010-07-19 DIAGNOSIS — G4700 Insomnia, unspecified: Secondary | ICD-10-CM

## 2010-07-19 DIAGNOSIS — E785 Hyperlipidemia, unspecified: Secondary | ICD-10-CM

## 2010-07-19 MED ORDER — ESZOPICLONE 2 MG PO TABS: 2.0000 mg | ORAL_TABLET | Freq: Every day | ORAL | Status: DC

## 2010-07-19 NOTE — Patient Instructions (Addendum)
We will set up your barium swallow examination Ask your physician about Megace for appetite You can increase the ensure to twice a day  Try the [facility] for your sleeping

## 2010-07-20 ENCOUNTER — Ambulatory Visit
Admission: RE | Admit: 2010-07-20 | Discharge: 2010-07-20 | Disposition: A | Discharge status: Home / Self Care | Payer: BC Managed Care – PPO | Source: Ambulatory Visit | Attending: Family Medicine | Admitting: Family Medicine

## 2010-07-20 ENCOUNTER — Telehealth: Payer: Self-pay | Admitting: *Deleted

## 2010-07-20 ENCOUNTER — Encounter: Payer: Self-pay | Admitting: Family Medicine

## 2010-07-20 LAB — COMPLETE METABOLIC PANEL WITH GFR
ALT: 16 U/L (ref 0–53)
AST: 17 U/L (ref 0–37)
Alkaline Phosphatase: 97 U/L (ref 39–117)
Creat: 1.24 mg/dL (ref 0.40–1.50)
Sodium: 140 mEq/L (ref 135–145)
Total Bilirubin: 0.4 mg/dL (ref 0.3–1.2)
Total Protein: 8.2 g/dL (ref 6.0–8.3)

## 2010-07-20 LAB — LIPID PANEL
HDL: 30 mg/dL — ABNORMAL LOW (ref >39)
LDL Cholesterol: 72 mg/dL (ref 0–99)
Total CHOL/HDL Ratio: 4.1 Ratio
Triglycerides: 109 mg/dL (ref <150)

## 2010-07-20 LAB — CBC WITH DIFFERENTIAL/PLATELET
Basophils Absolute: 0 10*3/uL (ref 0.0–0.1)
Basophils Relative: 0 % (ref 0–1)
Eosinophils Absolute: 0.1 10*3/uL (ref 0.0–0.7)
HCT: 35.4 % — ABNORMAL LOW (ref 39.0–52.0)
Hemoglobin: 11.5 g/dL — ABNORMAL LOW (ref 13.0–17.0)
Lymphs Abs: 1.8 10*3/uL (ref 0.7–4.0)
MCV: 114.9 fL — ABNORMAL HIGH (ref 78.0–100.0)
Neutrophils Relative %: 64 % (ref 43–77)
Platelets: 251 10*3/uL (ref 150–400)
RDW: 14.1 % (ref 11.5–15.5)
WBC: 6.8 10*3/uL (ref 4.0–10.5)

## 2010-07-20 LAB — URINALYSIS, MICROSCOPIC ONLY
Bacteria, UA: NONE SEEN
Crystals: NONE SEEN
Squamous Epithelial / LPF: NONE SEEN

## 2010-07-20 LAB — URINALYSIS, ROUTINE W REFLEX MICROSCOPIC
Glucose, UA: NEGATIVE mg/dL
Specific Gravity, Urine: 1.019 (ref 1.005–1.030)
pH: 7.5 (ref 5.0–8.0)

## 2010-07-20 LAB — T-HELPER CELL (CD4) - (RCID CLINIC ONLY): CD4 T Cell Abs: 400 uL (ref 400–2700)

## 2010-07-20 LAB — HIV-1 RNA QUANT-NO REFLEX-BLD: HIV 1 RNA Quant: <20 copies/mL (ref <20)

## 2010-07-20 NOTE — Progress Notes (Signed)
  Subjective:    Patient ID: Kenneth [name], male    DOB: 05-Nov-1955, 55 y.o.   MRN: [id number]  HPI  Concerned about weight loss, has had occasional difficulty with swallowing, pills not getting stuck able to eat all foods, no emesis, no reflux symptoms. Now taking ensure once a day, because of weight, appetite is very poor. Has not had follow-up for dysphagia or achalasia    Insomnia- continues to be a problem, has tried trazadone,, ambien, Ambien CR, OTC meds, changing schedule, does not feel tired during the day but not sleeping well at all, worse with weight loss  Hyperlipidemia- blood work drawn by ID HIV- reviewed last ID note, will see him in 2 weeks    Review of Systems     Objective:   Physical Exam   GEN- NAD, alert and oriented, weight down approx 30lbs   HEENT- No erythema of oropharynx, no ulceartions seen, MMM, no exudates   Neck- supple, non tender with palpation, no cervical nodes    CVS- RRR, no murmur   RESP- CTAB  EXT- no edema       Assessment & Plan:

## 2010-07-20 NOTE — Assessment & Plan Note (Signed)
Will obtain barium swallow, his appetite loss may be secondary to swallowing dysfunction vs HIV related wasting as CD4 count continues to drop.

## 2010-07-20 NOTE — Telephone Encounter (Signed)
PA required for Lunesta . Form placed in MD box. Preferred drugs are Zaleplon, zolpidem, zolpidem CR. Will forward message to MD.

## 2010-07-20 NOTE — Assessment & Plan Note (Signed)
Continue ensure, labs drawn Barium swallow Consider Megace- will have pt discuss with ID physician as work-up ensues

## 2010-07-20 NOTE — Assessment & Plan Note (Signed)
Will f/u labs adjust lipitor

## 2010-07-20 NOTE — Assessment & Plan Note (Signed)
Trial of lunesta as pt has tired other meds Improving his sleep may help with body in this state of vast weight loss

## 2010-07-21 MED ORDER — ZALEPLON 5 MG PO CAPS: 5.0000 mg | ORAL_CAPSULE | Freq: Every day | ORAL | Status: DC

## 2010-07-21 NOTE — Telephone Encounter (Signed)
Insurance does not cover Lunesta, will send Sonata as he has not tried this , he has tried Ambien and Ambien CR, Trazadone, behavioral methods. Please let pt know.

## 2010-07-25 ENCOUNTER — Telehealth: Payer: Self-pay | Admitting: Family Medicine

## 2010-07-25 DIAGNOSIS — B3781 Candidal esophagitis: Secondary | ICD-10-CM | POA: Insufficient documentation

## 2010-07-25 NOTE — Telephone Encounter (Signed)
Discussed results with pt Will set up GI appt for EGD

## 2010-08-01 ENCOUNTER — Other Ambulatory Visit: Payer: Self-pay | Admitting: Family Medicine

## 2010-08-01 MED ORDER — EFAVIRENZ 600 MG PO TABS: 600.0000 mg | ORAL_TABLET | Freq: Every day | ORAL | Status: DC

## 2010-08-02 ENCOUNTER — Ambulatory Visit: Payer: Self-pay | Admitting: Infectious Diseases

## 2010-08-02 DIAGNOSIS — B3781 Candidal esophagitis: Secondary | ICD-10-CM

## 2010-08-02 HISTORY — PX: ESOPHAGOGASTRODUODENOSCOPY: SHX1529

## 2010-08-02 HISTORY — DX: Candidal esophagitis: B37.81

## 2010-08-05 ENCOUNTER — Encounter: Payer: Self-pay | Admitting: Infectious Diseases

## 2010-08-05 ENCOUNTER — Ambulatory Visit (INDEPENDENT_AMBULATORY_CARE_PROVIDER_SITE_OTHER): Payer: BC Managed Care – PPO | Admitting: Infectious Diseases

## 2010-08-05 DIAGNOSIS — N182 Chronic kidney disease, stage 2 (mild): Secondary | ICD-10-CM

## 2010-08-05 DIAGNOSIS — R1310 Dysphagia, unspecified: Secondary | ICD-10-CM

## 2010-08-05 DIAGNOSIS — B20 Human immunodeficiency virus [HIV] disease: Secondary | ICD-10-CM

## 2010-08-05 DIAGNOSIS — E785 Hyperlipidemia, unspecified: Secondary | ICD-10-CM

## 2010-08-05 NOTE — Assessment & Plan Note (Addendum)
He is doing well. Will cont on his current medications. He is not sexually active. Will see him back 4-5 months with labs prior. Will check Hep A to see if he needs vax

## 2010-08-05 NOTE — Progress Notes (Signed)
  Subjective:    Patient ID: Kenneth [name], male    DOB: 05-19-1955, 55 y.o.   MRN: [id number]  HPI 55 yo M with hx of HIV + dx 4-08 and has been on EFV/CBV since 01-2009. 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 Meds doing well, no missed or forgotten doses. Overall feels well. Has been having dysphagia for years and is planned for a esophageal diatation next week. Wt has decreased.    Review of Systems  Gastrointestinal: Negative for diarrhea and constipation.  Genitourinary: Negative for dysuria.  Hematological: Negative for adenopathy. Does not bruise/bleed easily.  had negative colonoscopy 2008. Has lost 25-30# due to esophageal problems.      Objective:   Physical Exam  Constitutional: He appears well-developed and well-nourished.  Eyes: EOM are normal. Pupils are equal, round, and reactive to light.  Neck: Neck supple.  Cardiovascular: Normal rate, regular rhythm and normal heart sounds.   Pulmonary/Chest: Effort normal and breath sounds normal. No respiratory distress.  Abdominal: Soft. Bowel sounds are normal. He exhibits no distension.          Assessment & Plan:

## 2010-08-05 NOTE — Assessment & Plan Note (Signed)
Continue to watch his Cr (1.24 at last visit)

## 2010-08-05 NOTE — Assessment & Plan Note (Signed)
Lipids are normal more likely a result of his malnutrition

## 2010-08-05 NOTE — Assessment & Plan Note (Signed)
He is scheduled for dilatation in the coming week.

## 2010-08-16 ENCOUNTER — Encounter: Payer: Self-pay | Admitting: Sports Medicine

## 2010-08-19 ENCOUNTER — Ambulatory Visit (HOSPITAL_COMMUNITY)
Admission: RE | Admit: 2010-08-19 | Discharge: 2010-08-19 | Disposition: A | Discharge status: Home / Self Care | Payer: BC Managed Care – PPO | Source: Ambulatory Visit | Attending: Gastroenterology | Admitting: Gastroenterology

## 2010-08-19 DIAGNOSIS — I10 Essential (primary) hypertension: Secondary | ICD-10-CM | POA: Insufficient documentation

## 2010-08-19 DIAGNOSIS — Z21 Asymptomatic human immunodeficiency virus [HIV] infection status: Secondary | ICD-10-CM | POA: Insufficient documentation

## 2010-08-19 DIAGNOSIS — R1310 Dysphagia, unspecified: Secondary | ICD-10-CM | POA: Insufficient documentation

## 2010-08-19 DIAGNOSIS — K209 Esophagitis, unspecified without bleeding: Secondary | ICD-10-CM | POA: Insufficient documentation

## 2010-08-19 DIAGNOSIS — Z905 Acquired absence of kidney: Secondary | ICD-10-CM | POA: Insufficient documentation

## 2010-08-19 NOTE — Op Note (Signed)
NAME:  Kenneth [name], Kenneth [name] NO.:  [id number]   MEDICAL RECORD NO.:  [id number]          PATIENT TYPE:  AMB   LOCATION:  ENDO                         FACILITY:  [facility]   PHYSICIAN:  [name], M.D.DATE OF BIRTH:  07/08/1955   DATE OF PROCEDURE:  06/27/2006  DATE OF DISCHARGE:                               OPERATIVE REPORT   PROCEDURE:  Colonoscopy.   MEDICATIONS:  Fentanyl 50 mcg, Versed 5 mg IV.   SCOPE:  Pentax adult scope.   INDICATION:  A 55 year old, rectal bleeding.   DESCRIPTION OF PROCEDURE:  The procedure had been explained to the  patient in the office and a consent obtained.  The patient stated that  he had a nonproductive cough that started yesterday.  He was already in  the unit prepped.  On physical exam, his lungs were clear but he did  have a dry hacking cough.  He had no sore throat.  The cough was  nonproductive. Since he was cleaned out, I decided to go ahead with the  procedure.   The Pentax adult scope was used.  We inserted the scope.  The prep was  excellent.  The patient had a long, tortuous colon and position changes  and abdominal pressure were required.  We were able to reach the cecum,  the ileocecal valve, and appendiceal orifice seen.  The scope was  withdrawn, and the cecum, ascending, transverse, descending and sigmoid  colon were seen well, no polyps seen.  No diverticula or any other  lesions.  In the rectum, the rectum was free of polyps.  The Pentax  adult scope was so thick, we were unable to retroflex in the rectum.  Upon withdrawal, the patient was seen to have internal hemorrhoids.   ASSESSMENT:  Hematochezia probably due to internal hemorrhoids.   PLAN:  1. I will give him hemorrhoid instruction sheet.  2. Keep the patient on fiber, et Ronney Asters.  3. We will give him a Z-Pak for his cough and see him back in the      office as needed.           ______________________________  [name],  M.D.     [name]  D:  06/27/2006  T:  06/27/2006  Job:  [id number]   cc:   Annye Asa, M.D.

## 2010-08-19 NOTE — Op Note (Signed)
NAME:  CHIKAMSO, COLALUCA NO.:  [id number]   MEDICAL RECORD NO.:  [id number]          PATIENT TYPE:  AMB   LOCATION:  ENDO                         FACILITY:  [facility]   PHYSICIAN:  [name], M.D.DATE OF BIRTH:  02/25/1956   DATE OF PROCEDURE:  05/18/2006  DATE OF DISCHARGE:                               OPERATIVE REPORT   SURGEON:  [name] L. [name], M.D.   PROCEDURE:  Esophagogastroduodenoscopy with a Savary dilatation.   MEDICATIONS:  Fentanyl 50 mcg, Versed 5 mg IV.   INDICATIONS:  A nice gentleman, who had achalasia in the late 1990s, and  underwent a laparoscopic Heller myotomy with a Toupe fundoplication in  [id number] by Dr. Dalbert Batman, and did extraordinarily for 7 years, and then began  to lose weight again and have dysphagia.  We went ahead with a  dilatation to 15 mm with the Savary dilators in November.  He had some  relief and some improvement in his ability to swallow, but still was not  swallowing well, but has been able to maintain his weight.  We discussed  the options of a redo of his Heller, balloon dilatation, Botox, and all  of these types of things, and my feeling was that the main problem was  decreased peristalsis in the esophagus.  We will attempt to dilate again  above 15 mm to which he was previously dilated to see if this will help.   DESCRIPTION OF PROCEDURE:  The procedure had been explained to the  patient and consent obtained.  In the left lateral decubitus position,  with the endoscopy unit on the fluoro table, using fluoroscopic  guidance, the Pentax scope was passed.  The distal esophagus was  entered.  There was some food material sticking on the esophagus.  The  esophagus was quite dilated.  There was narrowing at the distal part,  but it was not clearly strictured.  The scope easily passed.  A complete  endoscopy was performed.  We went down into the second duodenum, and  using fluoroscopic guidance, placed a Savary wire and  withdrew the scope  over the wire.  Then, with the patient's neck fully extended, I passed  the 14, 15, 16, 17-French dilators, with some heme with the 17-French  dilator.  The scope was withdrawn.  The patient tolerated the procedure  well.  There were no immediate complications.   ASSESSMENT:  Dilatation to 72 mm/51 [place].   PLAN:  1. Will continue to follow him clinically.  If he continues to have      dysphagia and does not get relief from this, we      well may need to consider some type of operative intervention,      given his young age.  2. He will remain on clear liquids for 4 hours, and if there are any      warning signs or symptoms, he will give [place] a call.           ______________________________  Joyice Faster. Rolla Flatten., M.D.     [name]  D:  05/18/2006  T:  05/19/2006  Job:  [id number]   cc:   Talbert Cage, M.D.  Edsel Petrin. Dalbert Batman, M.D.

## 2010-08-19 NOTE — Op Note (Signed)
NAME:  Kenneth [name], Kenneth [name] NO.:  [id number]   MEDICAL RECORD NO.:  [id number]          PATIENT TYPE:  AMB   LOCATION:  ENDO                         FACILITY:  [facility]   PHYSICIAN:  [name], M.D.DATE OF BIRTH:  [birth year]   DATE OF PROCEDURE:  02/01/2006  DATE OF DISCHARGE:                                 OPERATIVE REPORT   SURGEON:  [name] L. [name], M.D.   PROCEDURE:  Esophagogastroduodenoscopy with Savary dilatation.   MEDICATIONS:  Fentanyl 75 mcg, Versed 7.5 mg IV, Cetacaine spray.   INDICATIONS:  A nice, 55 year old gentleman, who in 1999 underwent a  laparoscopic Heller myotomy with a toupee fundoplication for achalasia that  was documented.  He had done quite well for 7 years, but has begun to have  problems with dysphagia.  Had an upper GI showing a dilated esophagus with  an air-fluid level, with tapering at the distal esophagus.  I felt that he  may have some stricturing of this area, and he may benefit from simple  dilation.   DESCRIPTION OF PROCEDURE:  The procedure over endoscopy and dilatation had  been explained to the patient, and the potential risks and benefits  discussed.  In the endoscopy suite, using fluoroscopic guidance, the  endoscope was placed and advanced.  The esophagus was quite dilated and  quite tonic, but there were no air-fluid levels or solid materials in the  esophagus.  The mucosa did seem a bit reddened and erythematous throughout.  The distal esophagus was narrowed, but the scope easily passed.  In the  retroflex view in the cardia were mounds of mucosa that were irregular that  had an abnormal appearance, consistent with previous toupee Nissen  fundoplication.  The remainder of the stomach and duodenum as well was  normal.  I then passed a Savary through the scope into the duodenum and  withdrew the scope over the wire.  The patient's neck was extended, and  using fluoroscopic guidance, we passed the 39, 42 and  45-French Savary  dilators over the guide wire.  Due to the dilated esophagus, there was some  kinking of the wire as the dilator passed, and we had to keep some traction  on it to keep it straight.  The dilator was seen to cross the diaphragm in  each case.  There was heme on the 45-French dilator.  The guide wire was  withdrawn.  There were no immediate complications.   ASSESSMENT:  Narrowing of the distal esophagus, possibly due to stricturing,  dilated to 38 [place].   PLAN:  Will recommend routine post-dilatation orders with liquids today, and  will follow up in the office in 4 to 6 weeks.           ______________________________  [name], M.D.     [name]  D:  02/01/2006  T:  02/01/2006  Job:  [id number]   cc:   Talbert Cage, M.D.

## 2010-08-26 ENCOUNTER — Ambulatory Visit (INDEPENDENT_AMBULATORY_CARE_PROVIDER_SITE_OTHER): Payer: BC Managed Care – PPO | Admitting: Adult Health

## 2010-08-26 ENCOUNTER — Encounter: Payer: Self-pay | Admitting: Adult Health

## 2010-08-26 VITALS — BP 147/84 | HR 56 | Temp 98.3°F | Ht 73.0 in | Wt 143.4 lb

## 2010-08-26 DIAGNOSIS — B3781 Candidal esophagitis: Secondary | ICD-10-CM

## 2010-08-26 NOTE — Progress Notes (Signed)
  Subjective:    Patient ID: Kenneth [name], male    DOB: [birth year], 55 y.o.   MRN: [id number]  HPI Presents to clinic for followup, status post EGD performed by GI, who diagnosed him having Candida esophagitis. He was placed on fluconazole therapy, and states his GERD symptom has improved. Remains with 2 days left of therapy with no refills. Voices no other complaints, for now, and states is feeling well.   Review of Systems  Constitutional: Negative.   Eyes: Negative.   Respiratory: Negative.   Gastrointestinal: Negative.   Genitourinary: Negative.   Musculoskeletal: Negative.   Neurological: Negative.   Psychiatric/Behavioral: Negative.        Objective:   Physical Exam  Constitutional: He is oriented to person, place, and time. He appears well-developed and well-nourished.  HENT:  Head: Normocephalic and atraumatic.  Mouth/Throat: Oropharynx is clear and moist.       No oral thrush noted  Eyes: Conjunctivae and EOM are normal. Pupils are equal, round, and reactive to light.  Neck: Normal range of motion. Neck supple.  Cardiovascular: Normal rate and regular rhythm.   Pulmonary/Chest: Effort normal and breath sounds normal.  Abdominal: Soft. Bowel sounds are normal.  Musculoskeletal: Normal range of motion.  Neurological: He is alert and oriented to person, place, and time. No cranial nerve deficit. He exhibits normal muscle tone. Coordination normal.  Skin: Skin is warm and dry.  Psychiatric: He has a normal mood and affect. His behavior is normal. Judgment and thought content normal.          Assessment & Plan:  1. Candida Esophagitis. Pain is most recent CD4 count is 400 and he had an undetectable viral load, recommend completing current therapy with no refills. Instructed that if the symptoms recurred, he should contact the clinic for reevaluation. Recommended that should he develop a second set episode we may investigate other possible causes for this to recur.  Otherwise, he should followup with [name] in his next scheduled visit with labs as per arranged with him and [name].  Verbally acknowledged and agreed with plan of care.

## 2010-08-27 NOTE — Op Note (Signed)
  NAME:  Kenneth [name], Kenneth [name] NO.:  [id number]  MEDICAL RECORD NO.:  [id number]           PATIENT TYPE:  O  LOCATION:  [facility]                         FACILITY:  [facility]  PHYSICIAN:  Yao Hyppolite L. Rolla Flatten., M.D.DATE OF BIRTH:  February 16, 1956  DATE OF PROCEDURE:  08/18/2010 DATE OF DISCHARGE:                              OPERATIVE REPORT   PROCEDURE:  Esophagogastroduodenoscopy with brushings and cultures.  INTENDED PROCEDURE:  Esophagogastroduodenoscopy with dilatation.  MEDICATIONS: 1. Cetacaine spray. 2. Fentanyl 75 mcg. 3. Versed 5 mg IV.  INDICATION:  [age]-year-old gentleman, who had Heller myotomy with toupee fundoplication 13 years ago and did well until about 2 or 3 years ago when he began to have some dysphagia.  He had dilatation to 17 mm at that time.  He has other illnesses specifically HIV disease and recent nephrectomy due to nonfunctioning kidney.  He has been unable to eat with difficulty swallowing.  He has not had any regurgitation.  We felt that he may have some scarring of his myotomy and that he would benefit from dilatation as he did in the past.  DESCRIPTION OF PROCEDURE:  The procedure had been explained to the patient and consent obtained.  The procedure was performed in the endoscopy unit with fluoro backup in anticipation of a Savary dilatation.  Patient swallowed the scope and immediately upon entering the esophagus, he was found to have diffuse cheesy exudate throughout the entire esophagus essentially covering the entire esophagus consistent with the fungal infection.  A complete endoscopy was performed.  The GE junction was somewhat narrowed, but was patent and distended with air.  There was no clear stricture.  The stomach and duodenum were seen well and were normal.  The retroflex view revealed some mucosa around the GE junction.  Several passes were made through the GE junction and it did not appear to be stenotic or  narrowed excessively and in view of the severe esophageal fungal infection, I elected not to dilate at this time.  We came back into the esophagus and obtained brushing for fungal smear and culture.  The scope was then withdrawn.  It is notable that the lumen of the esophagus was dilated with no peristalsis.  There was no retained liquids or other material in the esophagus.  The scope was withdrawn.  The patient tolerated the procedure well.  ASSESSMENT:  Severe fungal esophagitis in a gentleman who is human immunodeficiency virus positive.  This may well be the reason for his dysphagia rather than stenosis of his Heller myotomy.  PLAN:  We will begin the patient on Diflucan 100 mg daily.  We will see him back in the office in 2-3 weeks after the fungal smear and culture have returned.          ______________________________ [name], M.D.     [name]  D:  08/19/2010  T:  08/19/2010  Job:  [id number]  cc:   Vic Blackbird, MD  Electronically Signed by Laurence Spates M.D. on 08/27/2010 02:40:17 PM

## 2010-08-29 ENCOUNTER — Other Ambulatory Visit: Payer: Self-pay | Admitting: Family Medicine

## 2010-08-29 NOTE — Telephone Encounter (Signed)
Refill request

## 2010-08-30 ENCOUNTER — Other Ambulatory Visit: Payer: Self-pay | Admitting: Family Medicine

## 2010-08-30 NOTE — Telephone Encounter (Signed)
Refill request

## 2010-08-31 ENCOUNTER — Encounter: Payer: Self-pay | Admitting: Family Medicine

## 2010-08-31 ENCOUNTER — Ambulatory Visit (INDEPENDENT_AMBULATORY_CARE_PROVIDER_SITE_OTHER): Payer: BC Managed Care – PPO | Admitting: Family Medicine

## 2010-08-31 ENCOUNTER — Ambulatory Visit
Admission: RE | Admit: 2010-08-31 | Discharge: 2010-08-31 | Disposition: A | Discharge status: Home / Self Care | Payer: BC Managed Care – PPO | Source: Ambulatory Visit | Attending: Family Medicine | Admitting: Family Medicine

## 2010-08-31 VITALS — BP 137/82 | HR 80 | Temp 98.2°F | Ht 73.0 in | Wt 138.0 lb

## 2010-08-31 DIAGNOSIS — M25569 Pain in unspecified knee: Secondary | ICD-10-CM

## 2010-08-31 MED ORDER — HYDROCODONE-ACETAMINOPHEN 5-325 MG PO TABS: 1.0000 | ORAL_TABLET | ORAL | Status: DC | PRN

## 2010-08-31 NOTE — Patient Instructions (Addendum)
If you are not feeling better in the next day or two, please come back to the office.  We may need to do an MRI at that time.  I have prescribed pain medicine for you.  You can take the hydrocodone every 4-6 hour as needed for pain. The fluid in the knee may accumulate and you may need to have it drained again.  We will send the fluid to lab. Please go get xray today.

## 2010-08-31 NOTE — Assessment & Plan Note (Signed)
Left knee pain and swelling after falling from chair last night.  Although swelling and pain may be from fall, because of a lot of swelling and warmth to skin, I was concerned with septic joint vs gout vs fx.  Will check for uric acid, cbc with diff, bmet, and xray of L knee.  Because there was a lot of swelling, we decided to aspirate the fluid to relieve some pain.  We aspirated about 50 cc of bloody fluid.   Fluid was sent for Cx, cell count and crystal. I've given pt Rx for hydrocodone for pain.  Did not give cortisol injection with concerns for injection. Red flags given to rtc (worse pain and swelling, fever, chills, nausea/vomiting).  Discussed that fluid may reaccumalate and pt may need another aspiration.  May need MRI if pt has systemic symptoms.

## 2010-08-31 NOTE — Telephone Encounter (Signed)
Refill request for Dr. Dorian Heckle patient.

## 2010-08-31 NOTE — Progress Notes (Signed)
  Subjective:    Patient ID: Kenneth [name], male    DOB: 1955-07-10, 55 y.o.   MRN: [id number]  HPI Left knee pain x 1 day: Pain and swelling of Left knee x 1 day.  Pt was wrestling around with his roommate last night at 10 pm and fell off a chair.  He is not sure if he landed on his knee or if he landed on his friend.  He states that there was immediate pain, but it was not that bad until this morning.  He did not notice swelling until this morning. This morning pain is about 9 out of 10 and it is worse with weight bearing.  He did not hear a "pop" sound last night when he fell.  He states that flexion of knee is a little more painful than extension.   He states that he had one episode of gout several years ago, that was in his left ankle.    Review of Systems Denies fever, chills, cough, nausea, vomiting, diarrhea.  Other than the knee pain he feels fine.      Objective:   Physical Exam  Constitutional: He appears well-developed and well-nourished. He appears distressed.  Musculoskeletal:       Left knee: He exhibits decreased range of motion, swelling and effusion. He exhibits no ecchymosis, no deformity, no laceration, no erythema, normal alignment, no LCL laxity and no MCL laxity. tenderness found. No medial joint line and no lateral joint line tenderness noted.       Left knee is warm compared to R knee.  There was swelling but no redness.  Pt can flex, but it caused oain. Hyperextension caused pain, he was not able to strengthen the left knee.  +tenderness to palpation on lateral aspect of patella.     Joint Aspiration  Consent form signed.  Time out done. Area cleansed with betadine strips x 3.  Sterile procedure started.  5cc of Lidocaine 1% with epinephrine was injected with 22 gauge 1 1/2 inch needle.  After that was done, 18 gauge needle was used with 60 cc syringe to aspirate the knee.  18 gauge needle advanced on medial aspect of left knee.  About 50 cc of bloody fluid was  aspirated.  This was sent to lab for culture, cell count and crystal.  Pt tolerated the procedure well.  Pressure dressing placed on aspiration site.     Assessment & Plan:

## 2010-08-31 NOTE — Progress Notes (Signed)
Addended by: Savhanna Sliva P on: 08/31/2010 04:19 PM   Modules accepted: Orders

## 2010-09-01 LAB — CBC WITH DIFFERENTIAL/PLATELET
Basophils Relative: 0 % (ref 0–1)
Eosinophils Absolute: 0 10*3/uL (ref 0.0–0.7)
Eosinophils Relative: 1 % (ref 0–5)
Lymphs Abs: 1.6 10*3/uL (ref 0.7–4.0)
MCH: 37.3 pg — ABNORMAL HIGH (ref 26.0–34.0)
MCHC: 32.4 g/dL (ref 30.0–36.0)
MCV: 114.9 fL — ABNORMAL HIGH (ref 78.0–100.0)
Platelets: 227 10*3/uL (ref 150–400)
RDW: 13.8 % (ref 11.5–15.5)

## 2010-09-01 LAB — BASIC METABOLIC PANEL
CO2: 29 mEq/L (ref 19–32)
Calcium: 9.3 mg/dL (ref 8.4–10.5)
Creat: 1.21 mg/dL (ref 0.40–1.50)

## 2010-09-04 ENCOUNTER — Other Ambulatory Visit: Payer: Self-pay | Admitting: Internal Medicine

## 2010-09-04 DIAGNOSIS — B20 Human immunodeficiency virus [HIV] disease: Secondary | ICD-10-CM

## 2010-09-04 DIAGNOSIS — Z21 Asymptomatic human immunodeficiency virus [HIV] infection status: Secondary | ICD-10-CM

## 2010-09-04 LAB — BODY FLUID CULTURE: Gram Stain: NONE SEEN

## 2010-09-04 MED ORDER — LAMIVUDINE-ZIDOVUDINE 150-300 MG PO TABS: 1.0000 | ORAL_TABLET | Freq: Two times a day (BID) | ORAL | Status: DC

## 2010-09-16 ENCOUNTER — Telehealth: Payer: Self-pay | Admitting: Family Medicine

## 2010-09-16 LAB — FUNGUS CULTURE W SMEAR

## 2010-09-16 NOTE — Telephone Encounter (Signed)
Called to check in on patient. Make sure candidiasis was treated. He is feeling well, received treatment and completed course Now is recovering from a knee fracture, is on crutches seen at [place] Dr. Theda Sers He will call if he has any concerns

## 2010-09-30 ENCOUNTER — Other Ambulatory Visit: Payer: Self-pay | Admitting: Internal Medicine

## 2010-10-03 ENCOUNTER — Other Ambulatory Visit: Payer: Self-pay | Admitting: Internal Medicine

## 2010-10-04 ENCOUNTER — Encounter: Payer: Self-pay | Admitting: Family Medicine

## 2010-12-07 ENCOUNTER — Other Ambulatory Visit: Payer: Self-pay | Admitting: *Deleted

## 2010-12-07 DIAGNOSIS — B20 Human immunodeficiency virus [HIV] disease: Secondary | ICD-10-CM

## 2010-12-07 MED ORDER — LAMIVUDINE-ZIDOVUDINE 150-300 MG PO TABS: 1.0000 | ORAL_TABLET | Freq: Two times a day (BID) | ORAL | Status: DC

## 2010-12-13 ENCOUNTER — Other Ambulatory Visit (INDEPENDENT_AMBULATORY_CARE_PROVIDER_SITE_OTHER): Payer: BC Managed Care – PPO

## 2010-12-13 DIAGNOSIS — B20 Human immunodeficiency virus [HIV] disease: Secondary | ICD-10-CM

## 2010-12-13 DIAGNOSIS — Z79899 Other long term (current) drug therapy: Secondary | ICD-10-CM

## 2010-12-13 DIAGNOSIS — Z113 Encounter for screening for infections with a predominantly sexual mode of transmission: Secondary | ICD-10-CM

## 2010-12-13 LAB — URINALYSIS, ROUTINE W REFLEX MICROSCOPIC
Bilirubin Urine: NEGATIVE
Glucose, UA: NEGATIVE mg/dL
Hgb urine dipstick: NEGATIVE
Protein, ur: NEGATIVE mg/dL
pH: 6 (ref 5.0–8.0)

## 2010-12-13 LAB — CBC WITH DIFFERENTIAL/PLATELET
Hemoglobin: 12.6 g/dL — ABNORMAL LOW (ref 13.0–17.0)
Lymphocytes Relative: 35 % (ref 12–46)
Lymphs Abs: 1.8 10*3/uL (ref 0.7–4.0)
MCH: 37.2 pg — ABNORMAL HIGH (ref 26.0–34.0)
MCV: 113.6 fL — ABNORMAL HIGH (ref 78.0–100.0)
Monocytes Relative: 7 % (ref 3–12)
Neutrophils Relative %: 54 % (ref 43–77)
Platelets: 208 10*3/uL (ref 150–400)
RBC: 3.39 MIL/uL — ABNORMAL LOW (ref 4.22–5.81)
WBC: 5.1 10*3/uL (ref 4.0–10.5)

## 2010-12-13 LAB — RPR

## 2010-12-13 LAB — LIPID PANEL
HDL: 34 mg/dL — ABNORMAL LOW (ref >39)
LDL Cholesterol: 92 mg/dL (ref 0–99)

## 2010-12-13 LAB — URINALYSIS, MICROSCOPIC ONLY
Bacteria, UA: NONE SEEN
Casts: NONE SEEN
Crystals: NONE SEEN
Squamous Epithelial / LPF: NONE SEEN

## 2010-12-14 LAB — HIV-1 RNA QUANT-NO REFLEX-BLD
HIV 1 RNA Quant: <20 copies/mL (ref <20)
HIV-1 RNA Quant, Log: <1.3 {Log} (ref <1.30)

## 2010-12-14 LAB — COMPLETE METABOLIC PANEL WITH GFR
ALT: 21 U/L (ref 0–53)
Albumin: 4.2 g/dL (ref 3.5–5.2)
CO2: 25 mEq/L (ref 19–32)
Calcium: 9.5 mg/dL (ref 8.4–10.5)
Chloride: 105 mEq/L (ref 96–112)
GFR, Est African American: >60 mL/min (ref >60)
GFR, Est Non African American: >60 mL/min (ref >60)
Glucose, Bld: 93 mg/dL (ref 70–99)
Sodium: 141 mEq/L (ref 135–145)
Total Protein: 8.9 g/dL — ABNORMAL HIGH (ref 6.0–8.3)

## 2011-01-02 LAB — T-HELPER CELL (CD4) - (RCID CLINIC ONLY): CD4 % Helper T Cell: 19 — ABNORMAL LOW

## 2011-01-03 ENCOUNTER — Ambulatory Visit (INDEPENDENT_AMBULATORY_CARE_PROVIDER_SITE_OTHER): Payer: BC Managed Care – PPO | Admitting: Infectious Diseases

## 2011-01-03 ENCOUNTER — Encounter: Payer: Self-pay | Admitting: Infectious Diseases

## 2011-01-03 DIAGNOSIS — B20 Human immunodeficiency virus [HIV] disease: Secondary | ICD-10-CM

## 2011-01-03 DIAGNOSIS — N182 Chronic kidney disease, stage 2 (mild): Secondary | ICD-10-CM

## 2011-01-03 DIAGNOSIS — E785 Hyperlipidemia, unspecified: Secondary | ICD-10-CM

## 2011-01-03 NOTE — Assessment & Plan Note (Signed)
Cr stable. Appears to be doing well.

## 2011-01-03 NOTE — Assessment & Plan Note (Addendum)
Will recheck at his next visit. h is on statin.

## 2011-01-03 NOTE — Progress Notes (Signed)
  Subjective:    Patient ID: Kenneth [name], male    DOB: [birth year], 55 y.o.   MRN: [id number]  HPI 55 yo M with hx of HIV + dx 4-08 and has been on EFV/CBV since 01-2009. Also hx:  Achalasia s/p lap myotomy & fundoplication (123XX123). Was sched for esophageal dilation earlier in summer but this was deffered (felt to be due to thrush?) . 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    Review of Systems  Constitutional: Negative for appetite change and unexpected weight change.  Gastrointestinal: Negative for diarrhea and constipation.  Genitourinary: Negative for dysuria.  Psychiatric/Behavioral: Negative for dysphoric mood.       Objective:   Physical Exam  Constitutional: He appears well-developed and well-nourished.  Eyes: EOM are normal. Pupils are equal, round, and reactive to light.  Neck: Neck supple.  Cardiovascular: Normal rate, regular rhythm and normal heart sounds.   Pulmonary/Chest: Effort normal and breath sounds normal. He has no wheezes.  Abdominal: Soft. Bowel sounds are normal. There is no tenderness. There is no rebound.  Lymphadenopathy:    He has no cervical adenopathy.          Assessment & Plan:

## 2011-01-03 NOTE — Assessment & Plan Note (Addendum)
Doing well, offered condoms, refused. Offered flu shot refused. Has gained 4-5 # by his own account. Will see him back in 5-6 months with labs prior.

## 2011-01-16 LAB — T-HELPER CELL (CD4) - (RCID CLINIC ONLY): CD4 T Cell Abs: 560

## 2011-03-01 ENCOUNTER — Other Ambulatory Visit: Payer: Self-pay | Admitting: Family Medicine

## 2011-03-01 NOTE — Telephone Encounter (Signed)
Refill request

## 2011-04-13 ENCOUNTER — Encounter: Payer: Self-pay | Admitting: Family Medicine

## 2011-04-13 ENCOUNTER — Ambulatory Visit (INDEPENDENT_AMBULATORY_CARE_PROVIDER_SITE_OTHER): Payer: BC Managed Care – PPO | Admitting: Family Medicine

## 2011-04-13 VITALS — BP 148/81 | HR 50 | Ht 73.0 in | Wt 155.0 lb

## 2011-04-13 DIAGNOSIS — I10 Essential (primary) hypertension: Secondary | ICD-10-CM

## 2011-04-13 DIAGNOSIS — G4700 Insomnia, unspecified: Secondary | ICD-10-CM

## 2011-04-13 MED ORDER — ZALEPLON 10 MG PO CAPS: 10.0000 mg | ORAL_CAPSULE | Freq: Every day | ORAL | Status: DC

## 2011-04-13 NOTE — Patient Instructions (Signed)

## 2011-04-17 NOTE — Assessment & Plan Note (Signed)
Elevated today. Will follow up in 1-2 months. Will consider starting medication at this visit if BP still >140/90.

## 2011-04-17 NOTE — Assessment & Plan Note (Addendum)
This has been a chronic issue. Will increase sonata to 10mg  nightly. No psych red flags currently though ? If pt may benefit from remeron to help with sleep and appetite (appetite and weight have been stable). Also on differential is OSA, though body habitus doesn't necessarily fit this pattern. OSA may also partially explain HTN. Plan to follow up in 1-2 months.

## 2011-04-17 NOTE — Progress Notes (Signed)
S:  Pt is here for new MD and general follow up visit. Pt has a baseline history of HIV that is being followed by infectious disease via [name]. Pt states that he has been formally diagnosed since 2009. No acute issues with this today.   Insomnia: Pt states that he has had a longstanding history of insomnia that has seemed to worsen over the last 2-3 months. Pt currently works as a IT consultant at [employer]. Pt denies any change in his sleep pattern. Pt states that he cannot go to bed before 1am no matter what he does, but wakes up at 5 am every morning. Pt denies any daytime somnolence. No nighttime wakenings. Pt is unaware of snoring. Pt denies any heavy caffeine intake during the day. There have been no medication changes to HIV regimen recently.    O:  Current Outpatient Prescriptions  Medication Sig Dispense Refill  . atorvastatin (LIPITOR) 40 MG tablet take 1/2 tablet by mouth once daily  15 tablet  5  . efavirenz (SUSTIVA) 600 MG tablet Take 1 tablet (600 mg total) by mouth at bedtime.  30 tablet  11  . HYDROcodone-acetaminophen (NORCO) 5-325 MG per tablet Take 1 tablet by mouth every 4 (four) hours as needed for pain.  20 tablet  0  . lamiVUDine-zidovudine (COMBIVIR) 150-300 MG per tablet Take 1 tablet by mouth 2 (two) times daily.  60 tablet  5  . zaleplon (SONATA) 10 MG capsule Take 1 capsule (10 mg total) by mouth at bedtime.  30 capsule  5    Wt Readings from Last 3 Encounters:  04/13/11 155 lb (70.308 kg)  01/03/11 155 lb (70.308 kg)  08/31/10 138 lb (62.596 kg)   Temp Readings from Last 3 Encounters:  01/03/11 97.8 F (36.6 C) Oral  08/31/10 98.2 F (36.8 C) Oral  08/26/10 98.3 F (36.8 C) Oral   BP Readings from Last 3 Encounters:  04/13/11 148/81  01/03/11 142/86  08/31/10 137/82   Pulse Readings from Last 3 Encounters:  04/13/11 50  01/03/11 50  08/31/10 80    General: alert and cooperative HEENT: PERRLA, extra ocular movement intact,  sclera clear, anicteric and oropharynx clear, no lesions Heart: S1, S2 normal, no murmur, rub or gallop, regular rate and rhythm Lungs: clear to auscultation, no wheezes or rales and unlabored breathing Abdomen: abdomen is soft without significant tenderness, masses, organomegaly or guarding Extremities: extremities normal, atraumatic, no cyanosis or edema Skin:no rashes Neurology: normal without focal findings, mental status, speech normal, alert and oriented x3, PERLA and reflexes normal and symmetric   A/P:

## 2011-05-29 ENCOUNTER — Other Ambulatory Visit (INDEPENDENT_AMBULATORY_CARE_PROVIDER_SITE_OTHER): Payer: BC Managed Care – PPO

## 2011-05-29 DIAGNOSIS — B20 Human immunodeficiency virus [HIV] disease: Secondary | ICD-10-CM

## 2011-05-29 LAB — CBC
HCT: 35.5 % — ABNORMAL LOW (ref 39.0–52.0)
MCH: 36.9 pg — ABNORMAL HIGH (ref 26.0–34.0)
MCHC: 32.7 g/dL (ref 30.0–36.0)
MCV: 113.1 fL — ABNORMAL HIGH (ref 78.0–100.0)
Platelets: 234 10*3/uL (ref 150–400)
RDW: 13.6 % (ref 11.5–15.5)
WBC: 3.4 10*3/uL — ABNORMAL LOW (ref 4.0–10.5)

## 2011-05-29 LAB — COMPREHENSIVE METABOLIC PANEL
ALT: 18 U/L (ref 0–53)
AST: 29 U/L (ref 0–37)
CO2: 27 mEq/L (ref 19–32)
Chloride: 104 mEq/L (ref 96–112)
Sodium: 138 mEq/L (ref 135–145)
Total Bilirubin: 0.2 mg/dL — ABNORMAL LOW (ref 0.3–1.2)
Total Protein: 7.9 g/dL (ref 6.0–8.3)

## 2011-05-30 LAB — HEPATITIS A ANTIBODY, TOTAL: Hep A Total Ab: NEGATIVE

## 2011-06-06 ENCOUNTER — Other Ambulatory Visit: Payer: Self-pay | Admitting: *Deleted

## 2011-06-06 DIAGNOSIS — B20 Human immunodeficiency virus [HIV] disease: Secondary | ICD-10-CM

## 2011-06-06 MED ORDER — LAMIVUDINE-ZIDOVUDINE 150-300 MG PO TABS: 1.0000 | ORAL_TABLET | Freq: Two times a day (BID) | ORAL | Status: DC

## 2011-06-12 ENCOUNTER — Encounter: Payer: Self-pay | Admitting: Infectious Diseases

## 2011-06-12 ENCOUNTER — Ambulatory Visit (INDEPENDENT_AMBULATORY_CARE_PROVIDER_SITE_OTHER): Payer: BC Managed Care – PPO | Admitting: Infectious Diseases

## 2011-06-12 DIAGNOSIS — Z113 Encounter for screening for infections with a predominantly sexual mode of transmission: Secondary | ICD-10-CM

## 2011-06-12 DIAGNOSIS — B20 Human immunodeficiency virus [HIV] disease: Secondary | ICD-10-CM

## 2011-06-12 DIAGNOSIS — E785 Hyperlipidemia, unspecified: Secondary | ICD-10-CM

## 2011-06-12 DIAGNOSIS — I10 Essential (primary) hypertension: Secondary | ICD-10-CM

## 2011-06-12 NOTE — Assessment & Plan Note (Signed)
Needs Pneumonia vaccine by May but he refuses today. He also defers flu shot. ART has been going well. Offered condoms, refuses. Will see him back in 5-6 months with labs prior.

## 2011-06-12 NOTE — Assessment & Plan Note (Signed)
Doing well on lipitor.

## 2011-06-12 NOTE — Assessment & Plan Note (Signed)
He is currently asx but out of normal range. He has f/u scheduled for FPTS for repeat. He does exercise daily, walks ~1 mile/day.

## 2011-06-12 NOTE — Progress Notes (Signed)
  Subjective:    Patient ID: Kenneth [name], male    DOB: 1955-11-24, 56 y.o.   MRN: [id number]  HPI 56 yo M with hx of HIV + dx 4-08 and has been on EFV/CBV since 01-2009. Also hx:  Achalasia s/p lap myotomy & fundoplication (123XX123). Was sched for esophageal dilation earlier in summer but this was deffered (felt to be due to thrush?) .  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 No problems with ART.  HIV 1 RNA Quant (copies/mL)  Date Value  05/29/2011 <20   12/13/2010 <20   07/19/2010 <20      CD4 T Cell Abs (cmm)  Date Value  05/29/2011 270*  12/13/2010 400   07/19/2010 400        Review of Systems  Constitutional: Negative for appetite change and unexpected weight change.  Cardiovascular: Positive for chest pain. Negative for leg swelling.  Gastrointestinal: Negative for diarrhea and constipation.  Genitourinary: Negative for dysuria.  Neurological: Positive for headaches.       Objective:   Physical Exam  Constitutional: He appears well-developed and well-nourished.  HENT:  Mouth/Throat: No oropharyngeal exudate.  Eyes: EOM are normal. Pupils are equal, round, and reactive to light.  Neck: Neck supple.  Cardiovascular: Normal rate, regular rhythm and normal heart sounds.   Pulmonary/Chest: Effort normal and breath sounds normal.  Abdominal: Soft. Bowel sounds are normal. He exhibits no distension. There is no tenderness.  Musculoskeletal: He exhibits no edema.  Lymphadenopathy:    He has no cervical adenopathy.          Assessment & Plan:

## 2011-07-25 ENCOUNTER — Other Ambulatory Visit: Payer: Self-pay | Admitting: Infectious Diseases

## 2011-07-25 DIAGNOSIS — B20 Human immunodeficiency virus [HIV] disease: Secondary | ICD-10-CM

## 2011-08-24 ENCOUNTER — Other Ambulatory Visit: Payer: Self-pay | Admitting: Family Medicine

## 2011-11-06 ENCOUNTER — Other Ambulatory Visit: Payer: Self-pay | Admitting: Family Medicine

## 2011-11-15 ENCOUNTER — Other Ambulatory Visit: Payer: Self-pay | Admitting: Family Medicine

## 2011-11-15 DIAGNOSIS — G4700 Insomnia, unspecified: Secondary | ICD-10-CM

## 2011-11-15 MED ORDER — ZALEPLON 10 MG PO CAPS: 10.0000 mg | ORAL_CAPSULE | Freq: Every day | ORAL | Status: DC

## 2011-11-23 ENCOUNTER — Other Ambulatory Visit: Payer: Self-pay | Admitting: Infectious Diseases

## 2011-12-06 ENCOUNTER — Other Ambulatory Visit: Payer: Self-pay | Admitting: Infectious Diseases

## 2011-12-11 ENCOUNTER — Other Ambulatory Visit: Payer: Self-pay | Admitting: Infectious Diseases

## 2011-12-11 DIAGNOSIS — Z113 Encounter for screening for infections with a predominantly sexual mode of transmission: Secondary | ICD-10-CM

## 2011-12-13 ENCOUNTER — Other Ambulatory Visit (INDEPENDENT_AMBULATORY_CARE_PROVIDER_SITE_OTHER): Payer: BC Managed Care – PPO

## 2011-12-13 DIAGNOSIS — B20 Human immunodeficiency virus [HIV] disease: Secondary | ICD-10-CM

## 2011-12-13 DIAGNOSIS — Z113 Encounter for screening for infections with a predominantly sexual mode of transmission: Secondary | ICD-10-CM

## 2011-12-13 LAB — LIPID PANEL
HDL: 28 mg/dL — ABNORMAL LOW (ref >39)
LDL Cholesterol: 87 mg/dL (ref 0–99)
Triglycerides: 109 mg/dL (ref <150)
VLDL: 22 mg/dL (ref 0–40)

## 2011-12-13 LAB — COMPREHENSIVE METABOLIC PANEL
Alkaline Phosphatase: 63 U/L (ref 39–117)
BUN: 10 mg/dL (ref 6–23)
Glucose, Bld: 93 mg/dL (ref 70–99)
Sodium: 141 mEq/L (ref 135–145)
Total Bilirubin: 0.3 mg/dL (ref 0.3–1.2)
Total Protein: 8.1 g/dL (ref 6.0–8.3)

## 2011-12-13 LAB — CBC
HCT: 33.7 % — ABNORMAL LOW (ref 39.0–52.0)
Hemoglobin: 11.5 g/dL — ABNORMAL LOW (ref 13.0–17.0)
RBC: 3.1 MIL/uL — ABNORMAL LOW (ref 4.22–5.81)

## 2011-12-14 LAB — HIV-1 RNA QUANT-NO REFLEX-BLD
HIV 1 RNA Quant: <20 copies/mL (ref <20)
HIV-1 RNA Quant, Log: <1.3 {Log} (ref <1.30)

## 2011-12-21 ENCOUNTER — Other Ambulatory Visit: Payer: Self-pay | Admitting: Family Medicine

## 2011-12-27 ENCOUNTER — Encounter: Payer: Self-pay | Admitting: Infectious Diseases

## 2011-12-27 ENCOUNTER — Other Ambulatory Visit: Payer: Self-pay | Admitting: *Deleted

## 2011-12-27 ENCOUNTER — Ambulatory Visit (INDEPENDENT_AMBULATORY_CARE_PROVIDER_SITE_OTHER): Payer: BC Managed Care – PPO | Admitting: Infectious Diseases

## 2011-12-27 VITALS — BP 156/81 | HR 59 | Temp 98.1°F | Ht 73.0 in | Wt 159.0 lb

## 2011-12-27 DIAGNOSIS — B20 Human immunodeficiency virus [HIV] disease: Secondary | ICD-10-CM

## 2011-12-27 DIAGNOSIS — I10 Essential (primary) hypertension: Secondary | ICD-10-CM

## 2011-12-27 MED ORDER — LAMIVUDINE-ZIDOVUDINE 150-300 MG PO TABS: 1.0000 | ORAL_TABLET | Freq: Two times a day (BID) | ORAL | Status: DC

## 2011-12-27 NOTE — Assessment & Plan Note (Signed)
I repated his BP in clinic and got 135/85. I have asked him to monitor while he is out- at drug store or wherever BP monitor can be found. My great appreciation to his PCP

## 2011-12-27 NOTE — Assessment & Plan Note (Addendum)
He's doing well. Would like to change him to [place] but would like it to be non-TFV regiemen. Will await ABC containing regimen (will check HLA at next blood draw). Offered/refused flushot, pnvax, condoms. Will see him back in 6 months with labs prior.

## 2011-12-27 NOTE — Progress Notes (Signed)
  Subjective:    Patient ID: Kenneth [name], male    DOB: [birth year], 56 y.o.   MRN: [id number]  HPI 56 yo M with hx of HIV + dx 4-08 and has been on EFV/CBV since 01-2009. Also hx:  Achalasia s/p lap myotomy & fundoplication (123XX123). Was sched for esophageal dilation earlier in summer but this was deffered (felt to be due to thrush?).  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  Does not check BP when out and about.  Refuses PNVX and FLuvax today.     Review of Systems  Constitutional: Negative for appetite change and unexpected weight change.  Respiratory: Negative for shortness of breath.   Cardiovascular: Negative for chest pain.  Gastrointestinal: Negative for diarrhea and constipation.  Genitourinary: Negative for dysuria.  Neurological: Negative for headaches.       Objective:   Physical Exam  Constitutional: He appears well-developed and well-nourished.  HENT:  Mouth/Throat: No oropharyngeal exudate.  Eyes: EOM are normal. Pupils are equal, round, and reactive to light.  Neck: Neck supple.  Cardiovascular: Normal rate, regular rhythm and normal heart sounds.   Pulmonary/Chest: Effort normal and breath sounds normal.  Abdominal: Soft. Bowel sounds are normal. He exhibits no distension. There is no tenderness.  Lymphadenopathy:    He has no cervical adenopathy.          Assessment & Plan:

## 2012-01-01 MED ORDER — ATORVASTATIN CALCIUM 40 MG PO TABS: 20.0000 mg | ORAL_TABLET | Freq: Every day | ORAL | Status: DC

## 2012-02-06 ENCOUNTER — Telehealth: Payer: Self-pay | Admitting: Family Medicine

## 2012-02-06 MED ORDER — ATORVASTATIN CALCIUM 40 MG PO TABS: 20.0000 mg | ORAL_TABLET | Freq: Every day | ORAL | Status: DC

## 2012-02-06 NOTE — Telephone Encounter (Signed)
Called pt and told him that he will need to make an appt to be seen since he has not met his new pcp. appt made for 11.11.13 @ 945 sent in Rx for 7 tabs no refills to [facility] [name]

## 2012-02-06 NOTE — Telephone Encounter (Signed)
Patient is calling for a refill on Liptitor.  He has no refills left and the last time he was seen in the office was 1/13, he has never seen Dr. Jess Barters.

## 2012-02-12 ENCOUNTER — Ambulatory Visit (INDEPENDENT_AMBULATORY_CARE_PROVIDER_SITE_OTHER): Payer: BC Managed Care – PPO | Admitting: Family Medicine

## 2012-02-12 ENCOUNTER — Encounter: Payer: Self-pay | Admitting: Family Medicine

## 2012-02-12 VITALS — BP 133/79 | HR 57 | Temp 98.2°F | Wt 161.0 lb

## 2012-02-12 DIAGNOSIS — I10 Essential (primary) hypertension: Secondary | ICD-10-CM

## 2012-02-12 DIAGNOSIS — E785 Hyperlipidemia, unspecified: Secondary | ICD-10-CM

## 2012-02-12 DIAGNOSIS — G4700 Insomnia, unspecified: Secondary | ICD-10-CM

## 2012-02-12 MED ORDER — ATORVASTATIN CALCIUM 40 MG PO TABS: 20.0000 mg | ORAL_TABLET | Freq: Every day | ORAL | Status: DC

## 2012-02-12 NOTE — Assessment & Plan Note (Signed)
Stable, off meds.  Periodic monitoring only

## 2012-02-12 NOTE — Assessment & Plan Note (Signed)
Patient to return if he wants to further discuss other medication options.  Not interested at this time.

## 2012-02-12 NOTE — Progress Notes (Signed)
Patient ID: Kenneth [name], male   DOB: 06-12-1955, 56 y.o.   MRN: [id number] Subjective: The patient is a 56 y.o. year old male who presents today for f/u.  1. HLD: Stable on lipitor.  No muscle aches.  BP controlled today.  No other CV risk factors.  2. Insomnia: Stable.  Sonata not really helping but continues taking.  Not interested in changing/trying other medications.  No problems with daytime sedation or sleepiness.  3. HIV: Stable, followed by ID  Patient's past medical, social, and family history were reviewed and updated as appropriate. History  Substance Use Topics  . Smoking status: Never Smoker   . Smokeless tobacco: Never Used  . Alcohol Use: No   Objective:  Filed Vitals:   02/12/12 0953  BP: 133/79  Pulse: 57  Temp: 98.2 F (36.8 C)   Gen: NAD, thin CV: RRR Resp: CTABL Ext: No edema, 2+ pulses  Assessment/Plan:  Please also see individual problems in problem list for problem-specific plans.

## 2012-02-12 NOTE — Assessment & Plan Note (Signed)
Continue lipitor at current dose.  As this has been stable for several years, feel only need to check yearly

## 2012-02-12 NOTE — Patient Instructions (Signed)
It was good to see you today! I have sent in a refill on your Lipitor. Plan on coming back to see [place] in 1 year, sooner if you have any problems.

## 2012-03-19 ENCOUNTER — Other Ambulatory Visit: Payer: Self-pay | Admitting: Infectious Diseases

## 2012-05-18 ENCOUNTER — Other Ambulatory Visit: Payer: Self-pay | Admitting: Family Medicine

## 2012-05-23 ENCOUNTER — Other Ambulatory Visit: Payer: Self-pay | Admitting: Family Medicine

## 2012-05-30 ENCOUNTER — Ambulatory Visit (INDEPENDENT_AMBULATORY_CARE_PROVIDER_SITE_OTHER): Payer: BC Managed Care – PPO | Admitting: Family Medicine

## 2012-05-30 ENCOUNTER — Encounter: Payer: Self-pay | Admitting: Family Medicine

## 2012-05-30 VITALS — BP 141/71 | HR 61 | Temp 98.2°F | Ht 73.0 in | Wt 158.0 lb

## 2012-05-30 DIAGNOSIS — E785 Hyperlipidemia, unspecified: Secondary | ICD-10-CM

## 2012-05-30 DIAGNOSIS — R030 Elevated blood-pressure reading, without diagnosis of hypertension: Secondary | ICD-10-CM

## 2012-05-30 DIAGNOSIS — IMO0001 Reserved for inherently not codable concepts without codable children: Secondary | ICD-10-CM

## 2012-05-30 DIAGNOSIS — G4700 Insomnia, unspecified: Secondary | ICD-10-CM

## 2012-05-30 MED ORDER — ZALEPLON 10 MG PO CAPS: 10.0000 mg | ORAL_CAPSULE | Freq: Every day | ORAL | Status: DC

## 2012-05-30 MED ORDER — TRAZODONE HCL 50 MG PO TABS: 25.0000 mg | ORAL_TABLET | Freq: Every evening | ORAL | Status: DC | PRN

## 2012-05-30 NOTE — Progress Notes (Signed)
Patient ID: Kenneth [name], male   DOB: 1956/01/01, 57 y.o.   MRN: [id number] Subjective: The patient is a 57 y.o. year old male who presents today for followup insomnia.  1. Insomnia: This is a long-standing problem. At the patient reports early morning awakenings and unable to fall back asleep. He takes Quar[employer] manager on an as-needed basis. He does not think it helps a lot and reports taking only 1-2 times per week. He does not have significant problems with fatigue during the day or falling asleep at inappropriate times. He believes he has been on Ambien in the past which also did not help. He is not certain of any other medicines he is trying.  2. Hypertension: Patient is a borderline blood pressures in the past. He is not currently on any medications. No chest pain, visual changes, or shortness of breath.  3. Hyperlipidemia: Patient continues on Lipitor. Last fasting lipid panel was reviewed as appropriate. He is not experiencing any side effects.  The patient denies any headaches, visual changes, lower extremity swelling, or diarrhea.  Patient's past medical, social, and family history were reviewed and updated as appropriate. History  Substance Use Topics  . Smoking status: Never Smoker   . Smokeless tobacco: Never Used  . Alcohol Use: No   Objective:  Filed Vitals:   05/30/12 1406  BP: 141/71  Pulse: 61  Temp: 98.2 F (36.8 C)   Gen: No acute distress CV: Regular rate and rhythm Resp: Clear to auscultation bilaterally Ext: 2+ pulses, no edema  Assessment/Plan:  Please also see individual problems in problem list for problem-specific plans.

## 2012-05-30 NOTE — Progress Notes (Deleted)
Patient ID: Kenneth [name], male   DOB: 1956-02-08, 57 y.o.   MRN: [id number] Subjective: The patient is a 57 y.o. year old male who presents today for f/u insomnia.    Patient's past medical, social, and family history were reviewed and updated as appropriate. History  Substance Use Topics  . Smoking status: Never Smoker   . Smokeless tobacco: Never Used  . Alcohol Use: No   Objective:  Filed Vitals:   05/30/12 1406  BP: 141/71  Pulse: 61  Temp: 98.2 F (36.8 C)   Gen: *** CV: *** Resp: *** Ext: ***  Assessment/Plan: ***  Please also see individual problems in problem list for problem-specific plans.

## 2012-05-30 NOTE — Assessment & Plan Note (Signed)
Continue current medications. Recheck lipid panel at next visit.

## 2012-05-30 NOTE — Assessment & Plan Note (Signed)
Blood pressure appropriate today. No intervention.

## 2012-05-30 NOTE — Assessment & Plan Note (Signed)
Long-term problem for this patient. I discussed with him the fact that we do not have good long-term data on the use of Sonata. I suggested that he try trazodone as he has not done so yet. I will give him a prescription for this. Patient was instructed not to use it and Sonata on the same night due to the possibility of side effects. I also discussed with the patient the fact that refills of Sonata, as it is a controlled substance, will require an office visit

## 2012-05-30 NOTE — Patient Instructions (Signed)
It was great to see you today! As we discussed, I cannot tell you that it is safe to take Sonata long term as it is only approved for use for 7-10 days.  I don't have any information that it is harmful, but I also can't tell you it is safe. I would suggest also trying trazadone for sleep.  You can take 1/2 or 1 pill at night.  Do not take it on nights that you take Sonata. You will need to come back in 6 months for further refills on Sonata.

## 2012-05-31 ENCOUNTER — Telehealth: Payer: Self-pay | Admitting: *Deleted

## 2012-05-31 NOTE — Telephone Encounter (Signed)
PA required for Zaleplon. Form placed in MD box.

## 2012-06-04 NOTE — Telephone Encounter (Signed)
Form faxed to insurance 

## 2012-06-06 NOTE — Telephone Encounter (Signed)
I completed this on 3/3

## 2012-06-19 ENCOUNTER — Other Ambulatory Visit: Payer: BC Managed Care – PPO

## 2012-06-19 DIAGNOSIS — B20 Human immunodeficiency virus [HIV] disease: Secondary | ICD-10-CM

## 2012-06-19 LAB — LIPID PANEL
Cholesterol: 148 mg/dL (ref 0–200)
HDL: 32 mg/dL — ABNORMAL LOW (ref >39)
Total CHOL/HDL Ratio: 4.6 Ratio

## 2012-06-19 LAB — COMPREHENSIVE METABOLIC PANEL
AST: 21 U/L (ref 0–37)
Albumin: 4 g/dL (ref 3.5–5.2)
BUN: 11 mg/dL (ref 6–23)
CO2: 28 mEq/L (ref 19–32)
Calcium: 9.4 mg/dL (ref 8.4–10.5)
Chloride: 107 mEq/L (ref 96–112)
Creat: 1.13 mg/dL (ref 0.50–1.35)
Potassium: 4.5 mEq/L (ref 3.5–5.3)

## 2012-06-19 LAB — CBC
HCT: 34.1 % — ABNORMAL LOW (ref 39.0–52.0)
Hemoglobin: 12.1 g/dL — ABNORMAL LOW (ref 13.0–17.0)
MCV: 102.7 fL — ABNORMAL HIGH (ref 78.0–100.0)
RBC: 3.32 MIL/uL — ABNORMAL LOW (ref 4.22–5.81)
RDW: 14.3 % (ref 11.5–15.5)
WBC: 3.1 10*3/uL — ABNORMAL LOW (ref 4.0–10.5)

## 2012-06-19 LAB — RPR

## 2012-06-20 LAB — T-HELPER CELL (CD4) - (RCID CLINIC ONLY)
CD4 % Helper T Cell: 25 % — ABNORMAL LOW (ref 33–55)
CD4 T Cell Abs: 330 uL — ABNORMAL LOW (ref 400–2700)

## 2012-06-20 LAB — HIV-1 RNA QUANT-NO REFLEX-BLD: HIV-1 RNA Quant, Log: <1.3 {Log} (ref <1.30)

## 2012-06-25 LAB — HLA B*5701: HLA-B*5701: NEGATIVE

## 2012-07-03 ENCOUNTER — Encounter: Payer: Self-pay | Admitting: Infectious Diseases

## 2012-07-03 ENCOUNTER — Ambulatory Visit (INDEPENDENT_AMBULATORY_CARE_PROVIDER_SITE_OTHER): Payer: BC Managed Care – PPO | Admitting: Infectious Diseases

## 2012-07-03 VITALS — BP 159/87 | HR 52 | Temp 97.7°F | Ht 73.0 in | Wt 160.0 lb

## 2012-07-03 DIAGNOSIS — N182 Chronic kidney disease, stage 2 (mild): Secondary | ICD-10-CM

## 2012-07-03 DIAGNOSIS — B20 Human immunodeficiency virus [HIV] disease: Secondary | ICD-10-CM

## 2012-07-03 DIAGNOSIS — E785 Hyperlipidemia, unspecified: Secondary | ICD-10-CM

## 2012-07-03 NOTE — Assessment & Plan Note (Addendum)
He is doing very well. He is offered flu and pnvx updates. He refuses. He is also offered condoms, he refuses. Will see him back in 6 months. Try to get him to QD FDC  In the next year (that does not have TFV).

## 2012-07-03 NOTE — Assessment & Plan Note (Signed)
Doing well, will cont to watch his Cr and avoid nephrotoxins.

## 2012-07-03 NOTE — Progress Notes (Signed)
  Subjective:    Patient ID: Kenneth [name], male    DOB: 1955/11/24, 57 y.o.   MRN: [id number]  HPI 57 yo M with hx of HIV + dx 4-08 and has been on EFV/CBV since 01-2009.  Has been doing well with his current meds.  Achalasia s/p lap myotomy & fundoplication. 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  HIV 1 RNA Quant (copies/mL)  Date Value  06/19/2012 <20   12/13/2011 <20   05/29/2011 <20      CD4 T Cell Abs (cmm)  Date Value  06/19/2012 330*  12/13/2011 390*  05/29/2011 270*     Review of Systems  Constitutional: Negative for appetite change and unexpected weight change.  Respiratory: Negative for shortness of breath.   Cardiovascular: Negative for chest pain and leg swelling.  Gastrointestinal: Negative for diarrhea and constipation.  Neurological: Negative for headaches.       Objective:   Physical Exam  Constitutional: He appears well-developed and well-nourished.  HENT:  Mouth/Throat: No oropharyngeal exudate.  Eyes: EOM are normal. Pupils are equal, round, and reactive to light.  Neck: Neck supple.  Cardiovascular: Normal rate, regular rhythm and normal heart sounds.   Pulmonary/Chest: Effort normal and breath sounds normal.  Abdominal: Soft. Bowel sounds are normal. There is no tenderness.  Musculoskeletal: He exhibits no edema.  Lymphadenopathy:    He has no cervical adenopathy.          Assessment & Plan:

## 2012-07-03 NOTE — Assessment & Plan Note (Signed)
Lab Results  Component Value Date   CHOL 148 06/19/2012   HDL 32* 06/19/2012   LDLCALC 95 06/19/2012   LDLDIRECT 65 07/24/2006   TRIG 105 06/19/2012   CHOLHDL 4.6 06/19/2012    Doing well.

## 2012-07-22 ENCOUNTER — Other Ambulatory Visit: Payer: Self-pay | Admitting: *Deleted

## 2012-07-22 NOTE — Telephone Encounter (Signed)
Refill requests need to be sent to ID clinic.

## 2012-07-23 ENCOUNTER — Other Ambulatory Visit: Payer: Self-pay

## 2012-07-23 DIAGNOSIS — B20 Human immunodeficiency virus [HIV] disease: Secondary | ICD-10-CM

## 2012-07-23 MED ORDER — EFAVIRENZ 600 MG PO TABS: ORAL_TABLET | ORAL | Status: DC

## 2013-01-02 ENCOUNTER — Other Ambulatory Visit: Payer: BC Managed Care – PPO

## 2013-01-02 ENCOUNTER — Other Ambulatory Visit: Payer: Self-pay | Admitting: Infectious Diseases

## 2013-01-02 DIAGNOSIS — B20 Human immunodeficiency virus [HIV] disease: Secondary | ICD-10-CM

## 2013-01-02 LAB — CBC
MCH: 38.9 pg — ABNORMAL HIGH (ref 26.0–34.0)
Platelets: 183 10*3/uL (ref 150–400)
RBC: 3.14 MIL/uL — ABNORMAL LOW (ref 4.22–5.81)
RDW: 13.8 % (ref 11.5–15.5)

## 2013-01-02 LAB — COMPREHENSIVE METABOLIC PANEL
ALT: 13 U/L (ref 0–53)
AST: 18 U/L (ref 0–37)
Alkaline Phosphatase: 68 U/L (ref 39–117)
CO2: 28 mEq/L (ref 19–32)
Sodium: 138 mEq/L (ref 135–145)
Total Bilirubin: 0.3 mg/dL (ref 0.3–1.2)
Total Protein: 7.6 g/dL (ref 6.0–8.3)

## 2013-01-03 LAB — T-HELPER CELL (CD4) - (RCID CLINIC ONLY): CD4 T Cell Abs: 410 /uL (ref 400–2700)

## 2013-01-03 LAB — HIV-1 RNA QUANT-NO REFLEX-BLD: HIV 1 RNA Quant: <20 copies/mL (ref <20)

## 2013-01-14 ENCOUNTER — Ambulatory Visit (INDEPENDENT_AMBULATORY_CARE_PROVIDER_SITE_OTHER): Payer: BC Managed Care – PPO | Admitting: Infectious Diseases

## 2013-01-14 ENCOUNTER — Encounter: Payer: Self-pay | Admitting: Infectious Diseases

## 2013-01-14 VITALS — BP 151/83 | HR 54 | Temp 97.9°F | Ht 73.0 in | Wt 158.0 lb

## 2013-01-14 DIAGNOSIS — Z113 Encounter for screening for infections with a predominantly sexual mode of transmission: Secondary | ICD-10-CM

## 2013-01-14 DIAGNOSIS — B20 Human immunodeficiency virus [HIV] disease: Secondary | ICD-10-CM

## 2013-01-14 DIAGNOSIS — E785 Hyperlipidemia, unspecified: Secondary | ICD-10-CM

## 2013-01-14 DIAGNOSIS — IMO0001 Reserved for inherently not codable concepts without codable children: Secondary | ICD-10-CM

## 2013-01-14 DIAGNOSIS — N182 Chronic kidney disease, stage 2 (mild): Secondary | ICD-10-CM

## 2013-01-14 DIAGNOSIS — R030 Elevated blood-pressure reading, without diagnosis of hypertension: Secondary | ICD-10-CM

## 2013-01-14 NOTE — Assessment & Plan Note (Addendum)
He is doing very well. Is offered (repeatedly) flu shot, which he refuses. He is also offered condoms which he refuses. I offered to change his meds to tiumeq (he still needs correct HLA testing) but he wishes to continue his current ART.  Will see him back in 6 months.   Addendum Clarified lab- he is HLA B [id number] (-)

## 2013-01-14 NOTE — Assessment & Plan Note (Signed)
Needs to f/u with PCP

## 2013-01-14 NOTE — Progress Notes (Signed)
  Subjective:    Patient ID: Kenneth [name], male    DOB: November 05, 1955, 57 y.o.   MRN: [id number]  HPI 57 yo M with hx of HIV + dx 4-08 and has been on EFV/CBV since 01-2009.  Has been doing well with his current meds.  Achalasia s/p lap myotomy & fundoplication.  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 Has been feeling well. Has not gotten flu shot (refused). No reflux sx.    HIV 1 RNA Quant (copies/mL)  Date Value  01/02/2013 <20   06/19/2012 <20   12/13/2011 <20      CD4 T Cell Abs (/uL)  Date Value  01/02/2013 410   06/19/2012 330*  12/13/2011 390*   Review of Systems  Constitutional: Negative for appetite change and unexpected weight change.  Cardiovascular: Negative for leg swelling.  Gastrointestinal: Negative for diarrhea and constipation.  Genitourinary: Negative for dysuria and difficulty urinating.  Psychiatric/Behavioral: Positive for sleep disturbance.       Objective:   Physical Exam  Constitutional: He appears well-developed and well-nourished.  HENT:  Mouth/Throat: No oropharyngeal exudate.  Eyes: EOM are normal. Pupils are equal, round, and reactive to light.  Neck: Neck supple.  Cardiovascular: Normal rate, regular rhythm and normal heart sounds.   Pulmonary/Chest: Effort normal and breath sounds normal.  Abdominal: Soft. Bowel sounds are normal. He exhibits no distension. There is no tenderness.  Lymphadenopathy:    He has no cervical adenopathy.          Assessment & Plan:

## 2013-01-14 NOTE — Assessment & Plan Note (Signed)
Cr stable 

## 2013-01-14 NOTE — Assessment & Plan Note (Signed)
Continue to monitor. LFTs normal.

## 2013-02-06 ENCOUNTER — Other Ambulatory Visit: Payer: Self-pay

## 2013-02-13 ENCOUNTER — Other Ambulatory Visit: Payer: Self-pay | Admitting: Infectious Diseases

## 2013-02-14 ENCOUNTER — Other Ambulatory Visit: Payer: Self-pay | Admitting: Family Medicine

## 2013-02-14 NOTE — Telephone Encounter (Signed)
Medication refilled. Will have patient schedule an appointment with me.

## 2013-02-17 NOTE — Telephone Encounter (Signed)
patient scheduled for Wednesday 12/3 @ 2 pm

## 2013-03-05 ENCOUNTER — Ambulatory Visit (INDEPENDENT_AMBULATORY_CARE_PROVIDER_SITE_OTHER): Payer: BC Managed Care – PPO | Admitting: Family Medicine

## 2013-03-05 ENCOUNTER — Encounter: Payer: Self-pay | Admitting: Family Medicine

## 2013-03-05 VITALS — BP 143/67 | HR 55 | Temp 98.1°F | Ht 73.0 in | Wt 158.0 lb

## 2013-03-05 DIAGNOSIS — N529 Male erectile dysfunction, unspecified: Secondary | ICD-10-CM

## 2013-03-05 DIAGNOSIS — R030 Elevated blood-pressure reading, without diagnosis of hypertension: Secondary | ICD-10-CM

## 2013-03-05 DIAGNOSIS — F5232 Male orgasmic disorder: Secondary | ICD-10-CM

## 2013-03-05 DIAGNOSIS — IMO0001 Reserved for inherently not codable concepts without codable children: Secondary | ICD-10-CM

## 2013-03-05 MED ORDER — SILDENAFIL CITRATE 50 MG PO TABS
50.0000 mg | ORAL_TABLET | Freq: Every day | ORAL | Status: DC | PRN
Start: 2013-03-05 — End: 2013-06-09

## 2013-03-05 NOTE — Patient Instructions (Signed)
Hi Mr. Stoessel, It was a pleasure meeting you today. Today we talked about:  1. High blood pressure: Your blood pressures in the past have been a little high. I would like you to record your blood pressure at home (you can go to a fire department or pharmacy)  2. Sexual difficulty: We got some blood work to test your testosterone and TSH (thyroid) to see if there is any cause for your issue that can be found. In the meantime, I have filled a prescription for Viagra for you. If you have an erection lasting more than 4 hours, please seek medical attention at your nearest emergency room.  3. Insomnia: Your lack of sleep has been a chronic issue. You currently are okay with the sleep issues and are okay with addressing this on our next visit.  Please see me again in 3 months.  Cordelia Poche, MD

## 2013-03-05 NOTE — Assessment & Plan Note (Signed)
Will prescribe sildenafil 50mg  and order TSH and testosterone to assess if could be cause by hypothyroidism vs low testosterone/hypogonadism.

## 2013-03-05 NOTE — Assessment & Plan Note (Addendum)
Current blood pressure is elevated. Patient approaching age where higher blood pressures are expected. Advised patient to track blood pressures. Patient would rather see me in 3 months than 1 month. Will follow-up blood pressures at next visit and assess if medication should be started.

## 2013-03-05 NOTE — Progress Notes (Signed)
   Subjective:    Patient ID: Kenneth [name], male    DOB: 06-Jan-1956, 57 y.o.   MRN: [id number]  Erectile Dysfunction This is a chronic problem. The nature of his difficulty is maintaining erection. Pertinent negatives include no chills or genital pain. The symptoms are aggravated by poor sleep. Past treatments include sildenafil. The treatment provided significant relief. He has had no adverse reactions caused by medications.   Elevated Blood Pressure Patient has had elevated blood pressure in the past, up to 150s. Currently slightly elevated. He does not check his blood pressure at home. 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 He currently takes no blood pressure medication.   Review of Systems  Constitutional: Negative for chills.  Eyes: Negative for visual disturbance.  Respiratory: Negative for shortness of breath.   Cardiovascular: Negative for chest pain and palpitations.  Neurological: Negative for headaches.       Objective:   Physical Exam  Constitutional: He is oriented to person, place, and time. He appears well-developed and well-nourished.  Cardiovascular: Normal rate, regular rhythm and normal heart sounds.   No murmur heard. Pulmonary/Chest: Effort normal and breath sounds normal. No respiratory distress. He has no wheezes.  Neurological: He is alert and oriented to person, place, and time.          Assessment & Plan:

## 2013-03-06 LAB — TSH: TSH: 4.096 u[IU]/mL (ref 0.350–4.500)

## 2013-03-10 ENCOUNTER — Telehealth: Payer: Self-pay | Admitting: *Deleted

## 2013-03-10 NOTE — Telephone Encounter (Signed)
Message copied by Corinna Capra on Mon Mar 10, 2013  9:10 AM ------      Message from: Cordelia Poche A      Created: Sun Mar 09, 2013  6:57 AM       Please inform patient that results of TSH and Testosterone are normal. ------

## 2013-03-10 NOTE — Telephone Encounter (Signed)
Related message,patient voiced understanding. Kenneth [name], Kenneth [name]

## 2013-06-09 ENCOUNTER — Encounter (HOSPITAL_COMMUNITY): Payer: Self-pay | Admitting: Emergency Medicine

## 2013-06-09 ENCOUNTER — Emergency Department (HOSPITAL_COMMUNITY)
Admission: EM | Admit: 2013-06-09 | Discharge: 2013-06-09 | Disposition: A | Discharge status: Home / Self Care | Payer: BC Managed Care – PPO | Attending: Emergency Medicine | Admitting: Emergency Medicine

## 2013-06-09 ENCOUNTER — Emergency Department (HOSPITAL_COMMUNITY): Payer: BC Managed Care – PPO

## 2013-06-09 DIAGNOSIS — G4700 Insomnia, unspecified: Secondary | ICD-10-CM | POA: Insufficient documentation

## 2013-06-09 DIAGNOSIS — Z905 Acquired absence of kidney: Secondary | ICD-10-CM | POA: Insufficient documentation

## 2013-06-09 DIAGNOSIS — S298XXA Other specified injuries of thorax, initial encounter: Secondary | ICD-10-CM | POA: Insufficient documentation

## 2013-06-09 DIAGNOSIS — Z880 Allergy status to penicillin: Secondary | ICD-10-CM | POA: Insufficient documentation

## 2013-06-09 DIAGNOSIS — Y9389 Activity, other specified: Secondary | ICD-10-CM | POA: Insufficient documentation

## 2013-06-09 DIAGNOSIS — N182 Chronic kidney disease, stage 2 (mild): Secondary | ICD-10-CM | POA: Insufficient documentation

## 2013-06-09 DIAGNOSIS — I129 Hypertensive chronic kidney disease with stage 1 through stage 4 chronic kidney disease, or unspecified chronic kidney disease: Secondary | ICD-10-CM | POA: Insufficient documentation

## 2013-06-09 DIAGNOSIS — Z79899 Other long term (current) drug therapy: Secondary | ICD-10-CM | POA: Insufficient documentation

## 2013-06-09 DIAGNOSIS — Z8719 Personal history of other diseases of the digestive system: Secondary | ICD-10-CM | POA: Insufficient documentation

## 2013-06-09 DIAGNOSIS — Z21 Asymptomatic human immunodeficiency virus [HIV] infection status: Secondary | ICD-10-CM | POA: Insufficient documentation

## 2013-06-09 DIAGNOSIS — R079 Chest pain, unspecified: Secondary | ICD-10-CM

## 2013-06-09 DIAGNOSIS — Y92410 Unspecified street and highway as the place of occurrence of the external cause: Secondary | ICD-10-CM | POA: Insufficient documentation

## 2013-06-09 DIAGNOSIS — E785 Hyperlipidemia, unspecified: Secondary | ICD-10-CM | POA: Insufficient documentation

## 2013-06-09 DIAGNOSIS — E7800 Pure hypercholesterolemia, unspecified: Secondary | ICD-10-CM | POA: Insufficient documentation

## 2013-06-09 DIAGNOSIS — Z87828 Personal history of other (healed) physical injury and trauma: Secondary | ICD-10-CM | POA: Insufficient documentation

## 2013-06-09 LAB — BASIC METABOLIC PANEL
BUN: 9 mg/dL (ref 6–23)
CO2: 26 meq/L (ref 19–32)
Calcium: 9 mg/dL (ref 8.4–10.5)
Chloride: 105 mEq/L (ref 96–112)
Creatinine, Ser: 1.01 mg/dL (ref 0.50–1.35)
GFR calc Af Amer: >90 mL/min (ref >90)
GFR calc non Af Amer: 82 mL/min — ABNORMAL LOW (ref >90)
Glucose, Bld: 94 mg/dL (ref 70–99)
POTASSIUM: 4.3 meq/L (ref 3.7–5.3)
SODIUM: 142 meq/L (ref 137–147)

## 2013-06-09 LAB — CBC
HCT: 32.7 % — ABNORMAL LOW (ref 39.0–52.0)
HEMOGLOBIN: 11.4 g/dL — AB (ref 13.0–17.0)
MCH: 38.4 pg — ABNORMAL HIGH (ref 26.0–34.0)
MCHC: 34.9 g/dL (ref 30.0–36.0)
MCV: 110.1 fL — ABNORMAL HIGH (ref 78.0–100.0)
Platelets: 262 10*3/uL (ref 150–400)
RBC: 2.97 MIL/uL — AB (ref 4.22–5.81)
RDW: 13.6 % (ref 11.5–15.5)
WBC: 5.7 10*3/uL (ref 4.0–10.5)

## 2013-06-09 LAB — I-STAT TROPONIN, ED
TROPONIN I, POC: 0 ng/mL (ref 0.00–0.08)
Troponin i, poc: 0 ng/mL (ref 0.00–0.08)

## 2013-06-09 MED ORDER — PANTOPRAZOLE SODIUM 20 MG PO TBEC: 20.0000 mg | DELAYED_RELEASE_TABLET | Freq: Every day | ORAL | Status: DC

## 2013-06-09 NOTE — ED Notes (Signed)
Attempted to draw bloodwork, unable to obtain. Phlebotomy notified.
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
## 2013-06-09 NOTE — ED Notes (Signed)
Pt comfortable with discharge and follow up instructions. Prescriptions x1 

## 2013-06-09 NOTE — Discharge Instructions (Signed)
Chest Pain (Nonspecific) °It is often hard to give a specific diagnosis for the cause of chest pain. There is always a chance that your pain could be related to something serious, such as a heart attack or a blood clot in the lungs. You need to follow up with your caregiver for further evaluation. °CAUSES  °· Heartburn. °· Pneumonia or bronchitis. °· Anxiety or stress. °· Inflammation around your heart (pericarditis) or lung (pleuritis or pleurisy). °· A blood clot in the lung. °· A collapsed lung (pneumothorax). It can develop suddenly on its own (spontaneous pneumothorax) or from injury (trauma) to the chest. °· Shingles infection (herpes zoster virus). °The chest wall is composed of bones, muscles, and cartilage. Any of these can be the source of the pain. °· The bones can be bruised by injury. °· The muscles or cartilage can be strained by coughing or overwork. °· The cartilage can be affected by inflammation and become sore (costochondritis). °DIAGNOSIS  °Lab tests or other studies, such as X-rays, electrocardiography, stress testing, or cardiac imaging, may be needed to find the cause of your pain.  °TREATMENT  °· Treatment depends on what may be causing your chest pain. Treatment may include: °· Acid blockers for heartburn. °· Anti-inflammatory medicine. °· Pain medicine for inflammatory conditions. °· Antibiotics if an infection is present. °· You may be advised to change lifestyle habits. This includes stopping smoking and avoiding alcohol, caffeine, and chocolate. °· You may be advised to keep your head raised (elevated) when sleeping. This reduces the chance of acid going backward from your stomach into your esophagus. °· Most of the time, nonspecific chest pain will improve within 2 to 3 days with rest and mild pain medicine. °HOME CARE INSTRUCTIONS  °· If antibiotics were prescribed, take your antibiotics as directed. Finish them even if you start to feel better. °· For the next few days, avoid physical  activities that bring on chest pain. Continue physical activities as directed. °· Do not smoke. °· Avoid drinking alcohol. °· Only take over-the-counter or prescription medicine for pain, discomfort, or fever as directed by your caregiver. °· Follow your caregiver's suggestions for further testing if your chest pain does not go away. °· Keep any follow-up appointments you made. If you do not go to an appointment, you could develop lasting (chronic) problems with pain. If there is any problem keeping an appointment, you must call to reschedule. °SEEK MEDICAL CARE IF:  °· You think you are having problems from the medicine you are taking. Read your medicine instructions carefully. °· Your chest pain does not go away, even after treatment. °· You develop a rash with blisters on your chest. °SEEK IMMEDIATE MEDICAL CARE IF:  °· You have increased chest pain or pain that spreads to your arm, neck, jaw, back, or abdomen. °· You develop shortness of breath, an increasing cough, or you are coughing up blood. °· You have severe back or abdominal pain, feel nauseous, or vomit. °· You develop severe weakness, fainting, or chills. °· You have a fever. °THIS IS AN EMERGENCY. Do not wait to see if the pain will go away. Get medical help at once. Call your local emergency services (911 in [place] Do not drive yourself to the hospital. °MAKE SURE YOU:  °· Understand these instructions. °· Will watch your condition. °· Will get help right away if you are not doing well or get worse. °Document Released: 12/28/2004 Document Revised: 06/12/2011 Document Reviewed: 10/24/2007 °[employer]® Patient Information ©2014 [employer]   [employer]. ° °
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
## 2013-07-10 ENCOUNTER — Other Ambulatory Visit: Payer: BC Managed Care – PPO

## 2013-07-10 DIAGNOSIS — B20 Human immunodeficiency virus [HIV] disease: Secondary | ICD-10-CM

## 2013-07-10 DIAGNOSIS — E785 Hyperlipidemia, unspecified: Secondary | ICD-10-CM

## 2013-07-10 DIAGNOSIS — Z113 Encounter for screening for infections with a predominantly sexual mode of transmission: Secondary | ICD-10-CM

## 2013-07-10 LAB — LIPID PANEL
CHOL/HDL RATIO: 4.6 ratio
CHOLESTEROL: 133 mg/dL (ref 0–200)
HDL: 29 mg/dL — ABNORMAL LOW (ref >39)
LDL Cholesterol: 86 mg/dL (ref 0–99)
TRIGLYCERIDES: 88 mg/dL (ref <150)
VLDL: 18 mg/dL (ref 0–40)

## 2013-07-10 LAB — COMPLETE METABOLIC PANEL WITH GFR
ALBUMIN: 3.7 g/dL (ref 3.5–5.2)
ALT: 18 U/L (ref 0–53)
AST: 22 U/L (ref 0–37)
Alkaline Phosphatase: 91 U/L (ref 39–117)
BUN: 11 mg/dL (ref 6–23)
CO2: 27 meq/L (ref 19–32)
Calcium: 8.7 mg/dL (ref 8.4–10.5)
Chloride: 106 mEq/L (ref 96–112)
Creat: 1.04 mg/dL (ref 0.50–1.35)
GFR, Est Non African American: 80 mL/min
Glucose, Bld: 79 mg/dL (ref 70–99)
POTASSIUM: 4.4 meq/L (ref 3.5–5.3)
SODIUM: 140 meq/L (ref 135–145)
TOTAL PROTEIN: 7.7 g/dL (ref 6.0–8.3)
Total Bilirubin: 0.3 mg/dL (ref 0.2–1.2)

## 2013-07-10 LAB — CBC WITH DIFFERENTIAL/PLATELET
Basophils Absolute: 0 10*3/uL (ref 0.0–0.1)
Basophils Relative: 0 % (ref 0–1)
EOS ABS: 0.1 10*3/uL (ref 0.0–0.7)
Eosinophils Relative: 2 % (ref 0–5)
HCT: 33.1 % — ABNORMAL LOW (ref 39.0–52.0)
HEMOGLOBIN: 11.5 g/dL — AB (ref 13.0–17.0)
LYMPHS ABS: 1.6 10*3/uL (ref 0.7–4.0)
LYMPHS PCT: 25 % (ref 12–46)
MCH: 36.9 pg — AB (ref 26.0–34.0)
MCHC: 34.7 g/dL (ref 30.0–36.0)
MCV: 106.1 fL — ABNORMAL HIGH (ref 78.0–100.0)
MONOS PCT: 9 % (ref 3–12)
Monocytes Absolute: 0.6 10*3/uL (ref 0.1–1.0)
NEUTROS ABS: 4 10*3/uL (ref 1.7–7.7)
NEUTROS PCT: 64 % (ref 43–77)
PLATELETS: 192 10*3/uL (ref 150–400)
RBC: 3.12 MIL/uL — ABNORMAL LOW (ref 4.22–5.81)
RDW: 14 % (ref 11.5–15.5)
WBC: 6.3 10*3/uL (ref 4.0–10.5)

## 2013-07-10 LAB — RPR

## 2013-07-11 LAB — T-HELPER CELL (CD4) - (RCID CLINIC ONLY)
CD4 % Helper T Cell: 25 % — ABNORMAL LOW (ref 33–55)
CD4 T Cell Abs: 410 /uL (ref 400–2700)

## 2013-07-11 LAB — URINE CYTOLOGY ANCILLARY ONLY
CHLAMYDIA, DNA PROBE: NEGATIVE
Neisseria Gonorrhea: NEGATIVE

## 2013-07-12 LAB — HIV-1 RNA QUANT-NO REFLEX-BLD
HIV 1 RNA Quant: <20 copies/mL (ref <20)
HIV-1 RNA Quant, Log: <1.3 {Log} (ref <1.30)

## 2013-07-24 ENCOUNTER — Encounter: Payer: Self-pay | Admitting: Infectious Diseases

## 2013-07-24 ENCOUNTER — Ambulatory Visit (INDEPENDENT_AMBULATORY_CARE_PROVIDER_SITE_OTHER): Payer: BC Managed Care – PPO | Admitting: Infectious Diseases

## 2013-07-24 VITALS — BP 130/76 | HR 52 | Temp 98.0°F | Ht 73.0 in | Wt 144.0 lb

## 2013-07-24 DIAGNOSIS — E785 Hyperlipidemia, unspecified: Secondary | ICD-10-CM

## 2013-07-24 DIAGNOSIS — N182 Chronic kidney disease, stage 2 (mild): Secondary | ICD-10-CM

## 2013-07-24 DIAGNOSIS — B20 Human immunodeficiency virus [HIV] disease: Secondary | ICD-10-CM

## 2013-07-24 DIAGNOSIS — Z113 Encounter for screening for infections with a predominantly sexual mode of transmission: Secondary | ICD-10-CM

## 2013-07-24 MED ORDER — ABACAVIR-DOLUTEGRAVIR-LAMIVUD 600-50-300 MG PO TABS: 1.0000 | ORAL_TABLET | Freq: Every day | ORAL | Status: DC

## 2013-07-24 NOTE — Assessment & Plan Note (Signed)
He asks for Chloride. Will change him to Triumeq. Will check his Hep A/B serologies to see if he needs vax/revax. Offered/refuses condoms. He refuses flu/pnvx. rtc 3-4

## 2013-07-24 NOTE — Assessment & Plan Note (Signed)
His Cr is normal now. Will continue to try to use renal friendly ART.

## 2013-07-24 NOTE — Progress Notes (Signed)
   Subjective:    Patient ID: Kenneth [name], male    DOB: [birth year], 58 y.o.   MRN: [id number]  HPI 58 yo M with hx of HIV + dx 4-08 and has been on EFV/CBV since 01-2009.  He is s/p lap myotomy & fundoplication for achalasia.  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 Eating well, still has some occas feeling that food is lodged and he has emesis.   HIV 1 RNA Quant (copies/mL)  Date Value  07/10/2013 <20   01/02/2013 <20   06/19/2012 <20      CD4 T Cell Abs (/uL)  Date Value  07/10/2013 410   01/02/2013 410   06/19/2012 330*    Review of Systems  Constitutional: Negative for appetite change and unexpected weight change.  Respiratory: Negative for shortness of breath.   Cardiovascular: Negative for chest pain.  Gastrointestinal: Positive for vomiting. Negative for diarrhea and constipation.  Genitourinary: Negative for dysuria.  Neurological: Negative for headaches.       Objective:   Physical Exam  Constitutional: He appears well-developed and well-nourished.  HENT:  Mouth/Throat: No oropharyngeal exudate.  Eyes: EOM are normal. Pupils are equal, round, and reactive to light.  Neck: Neck supple.  Cardiovascular: Normal rate, regular rhythm and normal heart sounds.   Pulmonary/Chest: Effort normal and breath sounds normal.  Abdominal: Soft. Bowel sounds are normal. He exhibits no distension. There is no tenderness.  Musculoskeletal: He exhibits no edema.  Lymphadenopathy:    He has no cervical adenopathy.          Assessment & Plan:

## 2013-07-24 NOTE — Assessment & Plan Note (Signed)
Will continue his statin. His lfts are normal.

## 2013-11-11 ENCOUNTER — Other Ambulatory Visit: Payer: BC Managed Care – PPO

## 2013-11-11 DIAGNOSIS — Z113 Encounter for screening for infections with a predominantly sexual mode of transmission: Secondary | ICD-10-CM

## 2013-11-11 DIAGNOSIS — B20 Human immunodeficiency virus [HIV] disease: Secondary | ICD-10-CM

## 2013-11-11 LAB — CBC
HCT: 37.6 % — ABNORMAL LOW (ref 39.0–52.0)
Hemoglobin: 12.5 g/dL — ABNORMAL LOW (ref 13.0–17.0)
MCH: 32.4 pg (ref 26.0–34.0)
MCHC: 33.2 g/dL (ref 30.0–36.0)
MCV: 97.4 fL (ref 78.0–100.0)
Platelets: 163 10*3/uL (ref 150–400)
RBC: 3.86 MIL/uL — ABNORMAL LOW (ref 4.22–5.81)
RDW: 12.6 % (ref 11.5–15.5)
WBC: 4.7 10*3/uL (ref 4.0–10.5)

## 2013-11-11 LAB — COMPREHENSIVE METABOLIC PANEL
ALK PHOS: 89 U/L (ref 39–117)
ALT: 16 U/L (ref 0–53)
AST: 18 U/L (ref 0–37)
Albumin: 3.8 g/dL (ref 3.5–5.2)
BUN: 10 mg/dL (ref 6–23)
CHLORIDE: 104 meq/L (ref 96–112)
CO2: 29 mEq/L (ref 19–32)
Calcium: 8.9 mg/dL (ref 8.4–10.5)
Creat: 1.37 mg/dL — ABNORMAL HIGH (ref 0.50–1.35)
Glucose, Bld: 98 mg/dL (ref 70–99)
POTASSIUM: 3.9 meq/L (ref 3.5–5.3)
SODIUM: 138 meq/L (ref 135–145)
TOTAL PROTEIN: 8.1 g/dL (ref 6.0–8.3)
Total Bilirubin: 0.3 mg/dL (ref 0.2–1.2)

## 2013-11-12 LAB — T-HELPER CELL (CD4) - (RCID CLINIC ONLY)
CD4 % Helper T Cell: 24 % — ABNORMAL LOW (ref 33–55)
CD4 T Cell Abs: 390 /uL — ABNORMAL LOW (ref 400–2700)

## 2013-11-12 LAB — HIV-1 RNA QUANT-NO REFLEX-BLD: HIV-1 RNA Quant, Log: <1.3 {Log} (ref <1.30)

## 2013-11-12 LAB — HEPATITIS B SURFACE ANTIBODY,QUALITATIVE: Hep B S Ab: NEGATIVE

## 2013-11-12 LAB — RPR

## 2013-11-12 LAB — HEPATITIS A ANTIBODY, TOTAL: Hep A Total Ab: NONREACTIVE

## 2013-12-01 ENCOUNTER — Ambulatory Visit: Payer: BC Managed Care – PPO | Admitting: Infectious Diseases

## 2014-01-01 ENCOUNTER — Ambulatory Visit: Payer: BC Managed Care – PPO | Admitting: Infectious Diseases

## 2014-01-02 ENCOUNTER — Ambulatory Visit (INDEPENDENT_AMBULATORY_CARE_PROVIDER_SITE_OTHER): Payer: BC Managed Care – PPO | Admitting: Infectious Diseases

## 2014-01-02 ENCOUNTER — Encounter: Payer: Self-pay | Admitting: Infectious Diseases

## 2014-01-02 VITALS — BP 181/108 | HR 66 | Temp 98.1°F | Wt 157.0 lb

## 2014-01-02 DIAGNOSIS — Z79899 Other long term (current) drug therapy: Secondary | ICD-10-CM | POA: Diagnosis not present

## 2014-01-02 DIAGNOSIS — R030 Elevated blood-pressure reading, without diagnosis of hypertension: Secondary | ICD-10-CM

## 2014-01-02 DIAGNOSIS — B20 Human immunodeficiency virus [HIV] disease: Secondary | ICD-10-CM

## 2014-01-02 DIAGNOSIS — Z113 Encounter for screening for infections with a predominantly sexual mode of transmission: Secondary | ICD-10-CM | POA: Diagnosis not present

## 2014-01-02 DIAGNOSIS — IMO0001 Reserved for inherently not codable concepts without codable children: Secondary | ICD-10-CM

## 2014-01-02 DIAGNOSIS — E785 Hyperlipidemia, unspecified: Secondary | ICD-10-CM

## 2014-01-02 MED ORDER — AMLODIPINE BESYLATE 5 MG PO TABS: 5.0000 mg | ORAL_TABLET | Freq: Every day | ORAL | Status: DC

## 2014-01-02 MED ORDER — CLONIDINE HCL 0.1 MG PO TABS: 0.1000 mg | ORAL_TABLET | Freq: Two times a day (BID) | ORAL | Status: DC

## 2014-01-02 MED ORDER — CLONIDINE HCL 0.1 MG PO TABS
0.1000 mg | ORAL_TABLET | Freq: Once | ORAL | Status: AC
  Administered 2014-01-02: 0.1 mg via ORAL

## 2014-01-02 NOTE — Progress Notes (Signed)
   Subjective:    Patient ID: Kenneth [name], male    DOB: 1955-12-14, 58 y.o.   MRN: [id number]  HPI 58 yo M with hx of HIV + dx 4-08 and has been on EFV/CBV since 01-2009.  He is s/p lap myotomy & fundoplication for achalasia.  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 No headaches, SOB, CP. Has had swelling in his hands as well.  Has not been taking any rx for his HTN currently, has been off since he had his kidney removed. Took lisinopril previously (his BP had become low with this).   HIV 1 RNA Quant (copies/mL)  Date Value  11/11/2013 <20   07/10/2013 <20   01/02/2013 <20      CD4 T Cell Abs (/uL)  Date Value  11/11/2013 390*  07/10/2013 410   01/02/2013 410     Review of Systems  Constitutional: Negative for appetite change and unexpected weight change.  Respiratory: Negative for shortness of breath.   Cardiovascular: Positive for leg swelling. Negative for chest pain.  Gastrointestinal: Negative for nausea and diarrhea.  Genitourinary: Negative for difficulty urinating.       Objective:   Physical Exam  Constitutional: He appears well-developed and well-nourished.  HENT:  Mouth/Throat: No oropharyngeal exudate.  Eyes: EOM are normal. Pupils are equal, round, and reactive to light.  Neck: Neck supple.  Cardiovascular: Normal rate, regular rhythm and normal heart sounds.   Pulmonary/Chest: Effort normal and breath sounds normal.  Abdominal: Soft. Bowel sounds are normal. He exhibits no distension. There is no tenderness.  Musculoskeletal: He exhibits no edema.  Lymphadenopathy:    He has no cervical adenopathy.       Assessment & Plan:

## 2014-01-02 NOTE — Assessment & Plan Note (Addendum)
Needs/refuses flu, pnvx.  Needs Hep A, repeat Hep B series.  He is doing very well except for needing his vax updated.  Colonoscopy done 2008.  rtc 6 months.

## 2014-01-02 NOTE — Addendum Note (Signed)
Addended by: HATCHER, JEFFREY C on: 01/02/2014 11:03 AM   Modules accepted: Orders, Medications

## 2014-01-02 NOTE — Addendum Note (Signed)
Addended by: Myrtis Hopping A on: 01/02/2014 09:57 AM   Modules accepted: Orders

## 2014-01-02 NOTE — Assessment & Plan Note (Addendum)
I rechecked his BP and it was SBP 190. Will give him clonidine today in office. Will give him rx for norvasc 10mg . Will see him back in office in 1 week for BP check.   Add His clonidine was sent to pharmacy as standing order. It was given as a single dose in office and that was intended to be a single dose only. I called his pharmacy and clarified this.

## 2014-01-08 ENCOUNTER — Ambulatory Visit: Payer: BC Managed Care – PPO | Admitting: *Deleted

## 2014-01-08 VITALS — BP 180/90 | HR 69 | Temp 97.5°F

## 2014-01-08 DIAGNOSIS — I10 Essential (primary) hypertension: Secondary | ICD-10-CM

## 2014-01-20 ENCOUNTER — Ambulatory Visit (INDEPENDENT_AMBULATORY_CARE_PROVIDER_SITE_OTHER): Payer: BC Managed Care – PPO | Admitting: Family Medicine

## 2014-01-20 ENCOUNTER — Encounter: Payer: Self-pay | Admitting: Family Medicine

## 2014-01-20 VITALS — BP 160/86 | HR 90 | Temp 98.1°F | Ht 73.0 in | Wt 155.3 lb

## 2014-01-20 DIAGNOSIS — M2540 Effusion, unspecified joint: Secondary | ICD-10-CM

## 2014-01-20 DIAGNOSIS — M545 Low back pain, unspecified: Secondary | ICD-10-CM

## 2014-01-20 DIAGNOSIS — I10 Essential (primary) hypertension: Secondary | ICD-10-CM

## 2014-01-20 DIAGNOSIS — Z125 Encounter for screening for malignant neoplasm of prostate: Secondary | ICD-10-CM

## 2014-01-20 MED ORDER — AMLODIPINE BESYLATE 10 MG PO TABS: 10.0000 mg | ORAL_TABLET | Freq: Every day | ORAL | Status: DC

## 2014-01-20 MED ORDER — CYCLOBENZAPRINE HCL 5 MG PO TABS: 5.0000 mg | ORAL_TABLET | Freq: Three times a day (TID) | ORAL | Status: DC | PRN

## 2014-01-20 NOTE — Patient Instructions (Addendum)
Thank you for coming to see me today. It was a pleasure. Today we talked about:   High blood pressure: I will increase your amlodipine to 10mg  daily  Joint swelling: I am unsure of what is causing this at this time. Please try to keep your legs elevated to help reduce the swelling if this continues to occur  Back pain: this appears to be more related to your muscles rather than the nerves in your back. I will prescribe a muscle relaxant. If you start having weakness, radiating pain down your legs, issues controlling your bladder or bowels, please return to be seen.  Please make an appointment to see me in 3 months for follow-up.  If you have any questions or concerns, please do not hesitate to call the office at [phone].  Sincerely,  Cordelia Poche, MD

## 2014-01-20 NOTE — Progress Notes (Signed)
    Subjective    Kenneth [name] is a 58 y.o. male that presents for an office visit.   1. Hypertension: Was seen at infectious disease doctor's office and had a systolic of [id number]. Was given clonidine. No headaches, chest pain or shortness of breath.  2. Hands, ankle and knee swelling: Started three weeks ago. Associated with decreased energy due to pain. Symptoms have fluctuated and overall improved. Tightness associated with swelling. This has happened in the past. No weakness.  3. Sharp Back pain: sharp, left sided. Worse when laying down and when extending left leg. No injury. Has tried Tylenol which has not helped.  History  Substance Use Topics  . Smoking status: Never Smoker   . Smokeless tobacco: Never Used  . Alcohol Use: No    Allergies  Allergen Reactions  . Amoxicillin     REACTION: diffuse rash    No orders of the defined types were placed in this encounter.    ROS  Per HPI   Objective   BP 160/86  Pulse 90  Temp(Src) 98.1 F (36.7 C) (Oral)  Ht 6\' 1"  (1.854 m)  Wt 155 lb 4.8 oz (70.444 kg)  BMI 20.49 kg/m2  General: Fair appearing, thin male in no distress Musculoskeletal: Discomfort with low back extension while seated. Back pain aggravated with passive flexion of hip with simultaneous flexion of hip bilaterally. Negative straight leg. Sensation intact.Moves extremities spontaneously. Knees and hands without swelling or tenderness.  Assessment and Plan   Please refer to problem based charting of assessment and plan

## 2014-01-21 ENCOUNTER — Telehealth: Payer: Self-pay | Admitting: *Deleted

## 2014-01-21 LAB — PSA: PSA: 0.56 ng/mL (ref <=4.00)

## 2014-01-21 NOTE — Telephone Encounter (Signed)
Spoke with patient and informed of below labs

## 2014-01-21 NOTE — Telephone Encounter (Signed)
Message copied by Johny Shears on Wed Jan 21, 2014  1:55 PM ------      Message from: Cordelia Poche A      Created: Wed Jan 21, 2014  9:12 AM       PSA wnl. Please inform patient. ------

## 2014-02-01 DIAGNOSIS — M2540 Effusion, unspecified joint: Secondary | ICD-10-CM | POA: Insufficient documentation

## 2014-02-01 DIAGNOSIS — M545 Low back pain, unspecified: Secondary | ICD-10-CM | POA: Insufficient documentation

## 2014-02-01 NOTE — Assessment & Plan Note (Signed)
Most likely muscle strain. Will prescribe flexeril. Reviewed red flags with patient for neurologic causes.

## 2014-02-01 NOTE — Assessment & Plan Note (Signed)
Will increase to amlodipine 10mg  daily. No red flags.

## 2014-02-01 NOTE — Assessment & Plan Note (Signed)
Per patient history. None on exam. Unsure of cause. Patient without fevers or joint pain. Recommended keeping extremities elevated or return when symptoms are present.

## 2014-02-02 ENCOUNTER — Ambulatory Visit (INDEPENDENT_AMBULATORY_CARE_PROVIDER_SITE_OTHER): Payer: BC Managed Care – PPO | Admitting: Family Medicine

## 2014-02-02 ENCOUNTER — Encounter: Payer: Self-pay | Admitting: Family Medicine

## 2014-02-02 VITALS — BP 147/77 | HR 101 | Temp 98.0°F | Ht 73.0 in | Wt 146.0 lb

## 2014-02-02 DIAGNOSIS — M2550 Pain in unspecified joint: Secondary | ICD-10-CM

## 2014-02-02 MED ORDER — TRAMADOL HCL 50 MG PO TABS: 50.0000 mg | ORAL_TABLET | Freq: Three times a day (TID) | ORAL | Status: DC | PRN

## 2014-02-02 NOTE — Assessment & Plan Note (Signed)
Ongoing polyarticular, symmetric arthralgias.  DDx is quite large for this including : Transient synovitis 2/2 viral infection, cryoglobulinemia, lyme/parvovirus infection, HIV, Lupus, RF, seronegative spondyloarthropathies, gout.  While less than 4 weeks, will go ahead since symmetric arthralgias and hx of HIV and order ESR, CRP, and Hep C AB and refer to rheumatology.   - Did start Norvasc about 4 weeks ago, but < 1 % incidence of this being cause.  Could consider stopping this.  Unsure of other etiologies of his HIV medications possibly causing this.   - Could consider trial of steroids - Will give Ultram and continue tylenol for now.  F/U after rheumatology evaluation to discuss further management options.

## 2014-02-02 NOTE — Progress Notes (Signed)
Kenneth [name] is a 58 y.o. male who presents today for diffuse arthralgias.  Arthralgias - Pt states this has been ongoing now for about 4 weeks, never had before.  Located mainly in the MCP of the hands symmetrical, B/L Knees, B/L ankles.  Denies any diffuse effusions or edema, denies any rashes or purpura, denies fever, chills, or night sweats, or bowel/bladder loss.  Is HIV positive, recently started on norvasc for HTN, but otherwise denies any other medication changes.  Pain described as achy, worse with movement, sometimes awakens him at night, has been gradually unchanged over the past 4 weeks.  Has been using tylenol 650 mg TID without much relief and cannot use NSAID due to renal disease.  Denies any hx of bulemia or seizure hx.   Past Medical History  Diagnosis Date  . HIV infection   . Hyperlipidemia   . Hypertension   . Insomnia   . Dysphagia   . Achalasia   . Renal abscess     Stage II kidney disease  . Hemorrhoids, internal     History  Smoking status  . Never Smoker   Smokeless tobacco  . Never Used    Family History  Problem Relation Age of Onset  . Colon cancer Father     Current Outpatient Prescriptions on File Prior to Visit  Medication Sig Dispense Refill  . Abacavir-Dolutegravir-Lamivud 600-50-300 MG TABS Take 1 tablet by mouth daily. 90 tablet 6  . amLODipine (NORVASC) 10 MG tablet Take 1 tablet (10 mg total) by mouth daily. 30 tablet 11  . atorvastatin (LIPITOR) 40 MG tablet TAKE 1/2 TABLET BY MOUTH DAILY 30 tablet 11  . cyclobenzaprine (FLEXERIL) 5 MG tablet Take 1 tablet (5 mg total) by mouth 3 (three) times daily as needed for muscle spasms. 30 tablet 0  . pantoprazole (PROTONIX) 20 MG tablet Take 1 tablet (20 mg total) by mouth daily. 14 tablet 0   No current facility-administered medications on file prior to visit.    ROS: Per HPI.  All other systems reviewed and are negative.   Physical Exam Filed Vitals:   02/02/14 1524  BP: 147/77   Pulse: 101  Temp: 98 F (36.7 C)    Physical Examination: General appearance - alert, well appearing, and in no distress Musculoskeletal - Hands, Knees, Ankles:  no joint tenderness, deformity or swelling, no muscular tenderness noted, full range of motion without pain Extremities - peripheral pulses normal, no pedal edema, no clubbing or cyanosis Skin - normal coloration and turgor, no rashes, no suspicious skin lesions noted Neuro: No focal deficits B/L LE     Chemistry      Component Value Date/Time   NA 138 11/11/2013 1108   K 3.9 11/11/2013 1108   CL 104 11/11/2013 1108   CO2 29 11/11/2013 1108   BUN 10 11/11/2013 1108   CREATININE 1.37* 11/11/2013 1108   CREATININE 1.01 06/09/2013 1339      Component Value Date/Time   CALCIUM 8.9 11/11/2013 1108   ALKPHOS 89 11/11/2013 1108   AST 18 11/11/2013 1108   ALT 16 11/11/2013 1108   BILITOT 0.3 11/11/2013 1108      HIV + Hep C AB - 2008 negative

## 2014-02-02 NOTE — Patient Instructions (Signed)
Please see the rheumatologist in the next two weeks.    Thanks, Dr. Awanda Mink

## 2014-02-03 LAB — C-REACTIVE PROTEIN: CRP: 8.3 mg/dL — AB (ref <0.60)

## 2014-02-03 LAB — SEDIMENTATION RATE: SED RATE: 135 mm/h — AB (ref 0–16)

## 2014-02-03 LAB — HEPATITIS C ANTIBODY: HCV Ab: NEGATIVE

## 2014-02-09 ENCOUNTER — Inpatient Hospital Stay (HOSPITAL_COMMUNITY)
Admission: EM | Admit: 2014-02-09 | Discharge: 2014-02-18 | DRG: 682 | Disposition: A | Discharge status: Home Health | Payer: BC Managed Care – PPO | Attending: Family Medicine | Admitting: Family Medicine

## 2014-02-09 ENCOUNTER — Emergency Department (HOSPITAL_COMMUNITY): Payer: BC Managed Care – PPO

## 2014-02-09 ENCOUNTER — Encounter (HOSPITAL_COMMUNITY): Payer: Self-pay | Admitting: *Deleted

## 2014-02-09 DIAGNOSIS — Z881 Allergy status to other antibiotic agents status: Secondary | ICD-10-CM

## 2014-02-09 DIAGNOSIS — R6510 Systemic inflammatory response syndrome (SIRS) of non-infectious origin without acute organ dysfunction: Secondary | ICD-10-CM | POA: Diagnosis present

## 2014-02-09 DIAGNOSIS — R509 Fever, unspecified: Secondary | ICD-10-CM | POA: Diagnosis present

## 2014-02-09 DIAGNOSIS — R312 Other microscopic hematuria: Secondary | ICD-10-CM | POA: Diagnosis present

## 2014-02-09 DIAGNOSIS — N179 Acute kidney failure, unspecified: Secondary | ICD-10-CM | POA: Diagnosis present

## 2014-02-09 DIAGNOSIS — M329 Systemic lupus erythematosus, unspecified: Secondary | ICD-10-CM | POA: Diagnosis not present

## 2014-02-09 DIAGNOSIS — I10 Essential (primary) hypertension: Secondary | ICD-10-CM | POA: Diagnosis present

## 2014-02-09 DIAGNOSIS — Z905 Acquired absence of kidney: Secondary | ICD-10-CM | POA: Diagnosis present

## 2014-02-09 DIAGNOSIS — E875 Hyperkalemia: Secondary | ICD-10-CM | POA: Diagnosis not present

## 2014-02-09 DIAGNOSIS — N182 Chronic kidney disease, stage 2 (mild): Secondary | ICD-10-CM | POA: Diagnosis present

## 2014-02-09 DIAGNOSIS — I129 Hypertensive chronic kidney disease with stage 1 through stage 4 chronic kidney disease, or unspecified chronic kidney disease: Secondary | ICD-10-CM | POA: Diagnosis present

## 2014-02-09 DIAGNOSIS — M3214 Glomerular disease in systemic lupus erythematosus: Secondary | ICD-10-CM | POA: Diagnosis present

## 2014-02-09 DIAGNOSIS — R1030 Lower abdominal pain, unspecified: Secondary | ICD-10-CM | POA: Diagnosis not present

## 2014-02-09 DIAGNOSIS — E785 Hyperlipidemia, unspecified: Secondary | ICD-10-CM | POA: Diagnosis present

## 2014-02-09 DIAGNOSIS — D649 Anemia, unspecified: Secondary | ICD-10-CM | POA: Diagnosis not present

## 2014-02-09 DIAGNOSIS — Z800 Family history of malignant neoplasm of digestive organs: Secondary | ICD-10-CM | POA: Diagnosis not present

## 2014-02-09 DIAGNOSIS — B20 Human immunodeficiency virus [HIV] disease: Secondary | ICD-10-CM | POA: Diagnosis present

## 2014-02-09 DIAGNOSIS — Z21 Asymptomatic human immunodeficiency virus [HIV] infection status: Secondary | ICD-10-CM | POA: Diagnosis present

## 2014-02-09 DIAGNOSIS — E43 Unspecified severe protein-calorie malnutrition: Secondary | ICD-10-CM | POA: Diagnosis present

## 2014-02-09 DIAGNOSIS — R319 Hematuria, unspecified: Secondary | ICD-10-CM | POA: Diagnosis present

## 2014-02-09 DIAGNOSIS — R109 Unspecified abdominal pain: Secondary | ICD-10-CM | POA: Diagnosis present

## 2014-02-09 DIAGNOSIS — D638 Anemia in other chronic diseases classified elsewhere: Secondary | ICD-10-CM | POA: Diagnosis present

## 2014-02-09 DIAGNOSIS — Z681 Body mass index (BMI) 19 or less, adult: Secondary | ICD-10-CM

## 2014-02-09 DIAGNOSIS — M2550 Pain in unspecified joint: Secondary | ICD-10-CM | POA: Diagnosis not present

## 2014-02-09 DIAGNOSIS — D539 Nutritional anemia, unspecified: Secondary | ICD-10-CM | POA: Diagnosis present

## 2014-02-09 DIAGNOSIS — R000 Tachycardia, unspecified: Secondary | ICD-10-CM | POA: Diagnosis present

## 2014-02-09 LAB — CBC WITH DIFFERENTIAL/PLATELET
Basophils Absolute: 0 10*3/uL (ref 0.0–0.1)
Basophils Relative: 0 % (ref 0–1)
Eosinophils Absolute: 0.1 10*3/uL (ref 0.0–0.7)
Eosinophils Relative: 1 % (ref 0–5)
HCT: 28.4 % — ABNORMAL LOW (ref 39.0–52.0)
HEMOGLOBIN: 9.7 g/dL — AB (ref 13.0–17.0)
Lymphocytes Relative: 13 % (ref 12–46)
Lymphs Abs: 1.5 10*3/uL (ref 0.7–4.0)
MCH: 30.3 pg (ref 26.0–34.0)
MCHC: 34.2 g/dL (ref 30.0–36.0)
MCV: 88.8 fL (ref 78.0–100.0)
MONOS PCT: 10 % (ref 3–12)
Monocytes Absolute: 1.1 10*3/uL — ABNORMAL HIGH (ref 0.1–1.0)
NEUTROS ABS: 8.3 10*3/uL — AB (ref 1.7–7.7)
NEUTROS PCT: 76 % (ref 43–77)
Platelets: 207 10*3/uL (ref 150–400)
RBC: 3.2 MIL/uL — ABNORMAL LOW (ref 4.22–5.81)
RDW: 13.8 % (ref 11.5–15.5)
WBC: 10.9 10*3/uL — ABNORMAL HIGH (ref 4.0–10.5)

## 2014-02-09 LAB — I-STAT CG4 LACTIC ACID, ED: Lactic Acid, Venous: 0.67 mmol/L (ref 0.5–2.2)

## 2014-02-09 LAB — COMPREHENSIVE METABOLIC PANEL
ALK PHOS: 59 U/L (ref 39–117)
ALT: 8 U/L (ref 0–53)
ANION GAP: 15 (ref 5–15)
AST: 19 U/L (ref 0–37)
Albumin: 2 g/dL — ABNORMAL LOW (ref 3.5–5.2)
BILIRUBIN TOTAL: 0.2 mg/dL — AB (ref 0.3–1.2)
BUN: 87 mg/dL — AB (ref 6–23)
CHLORIDE: 105 meq/L (ref 96–112)
CO2: 16 mEq/L — ABNORMAL LOW (ref 19–32)
Calcium: 8.4 mg/dL (ref 8.4–10.5)
Creatinine, Ser: 6.28 mg/dL — ABNORMAL HIGH (ref 0.50–1.35)
GFR calc non Af Amer: 9 mL/min — ABNORMAL LOW (ref >90)
GFR, EST AFRICAN AMERICAN: 10 mL/min — AB (ref >90)
GLUCOSE: 114 mg/dL — AB (ref 70–99)
POTASSIUM: 6 meq/L — AB (ref 3.7–5.3)
Sodium: 136 mEq/L — ABNORMAL LOW (ref 137–147)
Total Protein: 8.8 g/dL — ABNORMAL HIGH (ref 6.0–8.3)

## 2014-02-09 LAB — URINALYSIS, ROUTINE W REFLEX MICROSCOPIC
Bilirubin Urine: NEGATIVE
Glucose, UA: NEGATIVE mg/dL
Ketones, ur: NEGATIVE mg/dL
NITRITE: NEGATIVE
PH: 5 (ref 5.0–8.0)
Protein, ur: 100 mg/dL — AB
SPECIFIC GRAVITY, URINE: 1.017 (ref 1.005–1.030)
Urobilinogen, UA: 1 mg/dL (ref 0.0–1.0)

## 2014-02-09 LAB — URINE MICROSCOPIC-ADD ON

## 2014-02-09 MED ORDER — SODIUM CHLORIDE 0.9 % IV BOLUS (SEPSIS)
1000.0000 mL | Freq: Once | INTRAVENOUS | Status: AC
  Administered 2014-02-09: 1000 mL via INTRAVENOUS

## 2014-02-09 MED ORDER — ACETAMINOPHEN 325 MG PO TABS
650.0000 mg | ORAL_TABLET | Freq: Once | ORAL | Status: AC
  Administered 2014-02-09: 650 mg via ORAL
  Filled 2014-02-09: qty 2

## 2014-02-09 MED ORDER — MORPHINE SULFATE 4 MG/ML IJ SOLN
4.0000 mg | Freq: Once | INTRAMUSCULAR | Status: AC
  Administered 2014-02-09: 4 mg via INTRAVENOUS
  Filled 2014-02-09: qty 1

## 2014-02-09 MED ORDER — SODIUM CHLORIDE 0.9 % IV BOLUS (SEPSIS)
1000.0000 mL | Freq: Once | INTRAVENOUS | Status: AC
  Administered 2014-02-10: 1000 mL via INTRAVENOUS

## 2014-02-09 MED ORDER — SODIUM BICARBONATE 8.4 % IV SOLN
25.0000 meq | Freq: Once | INTRAVENOUS | Status: AC
Start: 2014-02-10 — End: 2014-02-09
  Administered 2014-02-09: 25 meq via INTRAVENOUS
  Filled 2014-02-09: qty 50
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
## 2014-02-09 NOTE — ED Provider Notes (Signed)
CSN: [id number]     Arrival date & time 02/09/14  2216 History   First MD Initiated Contact with Patient 02/09/14 2222     Chief Complaint  Patient presents with  . Joint Pain     (Consider location/radiation/quality/duration/timing/severity/associated sxs/prior Treatment) HPI Comments: Patient is a 58 year old male with history of HIV, hyperlipidemia, hypertension, renal abscess who presents the emergency department for evaluation of generalized, aching joint pain. He reports that this problem has been ongoing for the past 4-5 weeks. He was seen by his primary care physician earlier in the week who gave him tramadol. He has been taking this without relief of his symptoms. He reports that his knees and ankles are the worse. He can walk, but states he must shuffle around taking small steps due to pain. He denies any rashes, known tick bites, fever, chills, nausea, vomiting, abdominal pain, cough, shortness of breath, chest pain. He has been taking frequent trips to [place] as this is where he is from. His last trip was in September.  The history is provided by the patient. No language interpreter was used.    Past Medical History  Diagnosis Date  . HIV infection   . Hyperlipidemia   . Hypertension   . Insomnia   . Dysphagia   . Achalasia   . Renal abscess     Stage II kidney disease  . Hemorrhoids, internal    Past Surgical History  Procedure Laterality Date  . Nephrectomy      Right   Family History  Problem Relation Age of Onset  . Colon cancer Father    History  Substance Use Topics  . Smoking status: Never Smoker   . Smokeless tobacco: Never Used  . Alcohol Use: No    Review of Systems  Constitutional: Negative for fever and chills.  Respiratory: Negative for shortness of breath.   Cardiovascular: Negative for chest pain.  Gastrointestinal: Negative for nausea, vomiting and abdominal pain.  Musculoskeletal: Positive for joint swelling and arthralgias.  All other  systems reviewed and are negative.     Allergies  Amoxicillin  Home Medications   Prior to Admission medications   Medication Sig Start Date End Date Taking? Authorizing Provider  Abacavir-Dolutegravir-Lamivud [id number] MG TABS Take 1 tablet by mouth daily. 07/24/13  Yes Campbell Riches, MD  amLODipine (NORVASC) 10 MG tablet Take 1 tablet (10 mg total) by mouth daily. 01/20/14  Yes Cordelia Poche, MD  atorvastatin (LIPITOR) 40 MG tablet TAKE 1/2 TABLET BY MOUTH DAILY Patient taking differently: Take 1/2 tablet by mouth daily 02/14/13  Yes Cordelia Poche, MD  cyclobenzaprine (FLEXERIL) 5 MG tablet Take 5 mg by mouth 3 (three) times daily as needed for muscle spasms.   Yes Historical Provider, MD  traMADol (ULTRAM) 50 MG tablet Take 1 tablet (50 mg total) by mouth every 8 (eight) hours as needed. Patient taking differently: Take 50 mg by mouth every 8 (eight) hours as needed for moderate pain.  02/02/14  Yes Bryan R Hess, DO  pantoprazole (PROTONIX) 20 MG tablet Take 1 tablet (20 mg total) by mouth daily. 06/09/13   Dorie Rank, MD   BP 162/83 mmHg  Pulse 108  Temp(Src) 100 F (37.8 C)  Resp 20  SpO2 99% Physical Exam  Constitutional: He is oriented to person, place, and time. He appears well-developed and well-nourished. No distress.  Patient feels hot to touch  HENT:  Head: Normocephalic and atraumatic.  Right Ear: External ear normal.  Left Ear: External  ear normal.  Nose: Nose normal.  Eyes: Conjunctivae are normal.  Neck: Normal range of motion. No tracheal deviation present.  No nuchal rigidity or meningeal signs  Cardiovascular: Normal rate, regular rhythm and normal heart sounds.   Pulmonary/Chest: Effort normal and breath sounds normal. No stridor.  Abdominal: Soft. He exhibits no distension. There is no tenderness.  Musculoskeletal: Normal range of motion.  Swelling to knees and ankles bilaterally. Painful ROM. No erythema to any joint.  Neurological: He is alert and  oriented to person, place, and time.  Skin: Skin is warm and dry. No petechiae and no rash noted. He is not diaphoretic.  Psychiatric: He has a normal mood and affect. His behavior is normal.  Nursing note and vitals reviewed.   ED Course  Procedures (including critical care time) Labs Review Labs Reviewed  CBC WITH DIFFERENTIAL - Abnormal; Notable for the following:    WBC 10.9 (*)    RBC 3.20 (*)    Hemoglobin 9.7 (*)    HCT 28.4 (*)    Neutro Abs 8.3 (*)    Monocytes Absolute 1.1 (*)    All other components within normal limits  COMPREHENSIVE METABOLIC PANEL - Abnormal; Notable for the following:    Sodium 136 (*)    Potassium 6.0 (*)    CO2 16 (*)    Glucose, Bld 114 (*)    BUN 87 (*)    Creatinine, Ser 6.28 (*)    Total Protein 8.8 (*)    Albumin 2.0 (*)    Total Bilirubin 0.2 (*)    GFR calc non Af Amer 9 (*)    GFR calc Af Amer 10 (*)    All other components within normal limits  URINALYSIS, ROUTINE W REFLEX MICROSCOPIC - Abnormal; Notable for the following:    APPearance CLOUDY (*)    Hgb urine dipstick LARGE (*)    Protein, ur 100 (*)    Leukocytes, UA TRACE (*)    All other components within normal limits  URINE MICROSCOPIC-ADD ON - Abnormal; Notable for the following:    Bacteria, UA MANY (*)    All other components within normal limits  URINE CULTURE  CK  B. BURGDORFI ANTIBODIES  LYME DISEASE DNA BY PCR(BORRELIA BURG)  B. BURGDORFI ANTIBODIES BY WB  I-STAT CG4 LACTIC ACID, ED    Imaging Review Dg Chest 2 View  02/09/2014   CLINICAL DATA:  Multifocal joint pain for 4-5 weeks.  EXAM: CHEST  2 VIEW  COMPARISON:  PA and lateral chest 06/09/2013.  FINDINGS: The lungs are clear. Heart size is upper normal. No pneumothorax or pleural effusion. No focal bony abnormality. Surgical clips left upper quadrant of the abdomen are noted.  IMPRESSION: No acute disease.   Electronically Signed   By: Inge Rise M.D.   On: 02/09/2014 23:50     EKG  Interpretation None      MDM   Final diagnoses:  Fever    Patient presents emergency department for evaluation of diffuse joint pain. This is an ongoing problem that gradually worsening for 4-5 weeks. This acutely worsened in the past 2-3 days. Patient with fever of 101.63F in emergency department. Tachycardic to 108. Will cover for Lyme Disease in ED with doxycycline. Patient with new renal failure with creatinine at 6.28. Hx of prior renal failure and antiviral therapy changed. Potassium is 6.0. Will give bicarbonate and fluids. Patient admitted to family practice. Vital signs stable at this time. Awaiting transport to [facility]  Hospital. Dr. Sabra Heck evaluated patient and agrees with plan. Patient / Family / Caregiver informed of clinical course, understand medical decision-making process, and agree with plan.    Elwyn Lade, PA-C 02/10/14 [id number]  Johnna Acosta, MD 02/10/14 [phone]

## 2014-02-09 NOTE — ED Notes (Signed)
Pt states having joint pain all over, states has seen his doctor and waiting to see specialist, states all pain medication he has is not working (tramadol), pt states pain sometimes gets worse in knees.

## 2014-02-09 NOTE — ED Notes (Signed)
Per EMS pt from home, pt complaining of joint pain all over, x 4-5 weeks, been to doctor for this, was told he would be set up w/ a rheumatologist, but his doctor has not given him a set date, states pain in all joints but knees are the worse, 8/10, aching.

## 2014-02-09 NOTE — ED Provider Notes (Signed)
The patient is a 58 year old male with a history of HIV for the last 10 years who is currently on antiretroviral therapy, who presents with a complaint of diffuse arthralgias with warm hot joints specifically of his knees and ankles but also having pain in his shoulders elbows and wrists. On exam the patient is tachycardic and febrile, soft abdomen, clear mentation and neurologic exam, resistant to moving any of this joint secondary to pain. The most affected joints or the bilateral knees. He does have a history of going back and forth to [place] but denies any tick bites, would consider Lyme's disease in the differential, could also be inflammatory arthritis as he has recently been seen and evaluated by family doctors and found to have a significantly elevated sedimentation rate over 130. Pain medication, antipyretics, admit for observation to the hospital. Rule out other sources of fever including urine and chest. His white blood cell count is over 10,000 but he is anemic with a 3 g hemoglobin drop over the last 3 months.  Pt with ARF, Fever, and diffuse Arthralgias - will need admission  Medical screening examination/treatment/procedure(s) were conducted as a shared visit with non-physician practitioner(s) and myself.  I personally evaluated the patient during the encounter.  Clinical Impression:   Final diagnoses:  Fever  Acute renal failure, unspecified acute renal failure type         Johnna Acosta, MD 02/10/14 [phone]

## 2014-02-09 NOTE — ED Notes (Signed)
MD at bedside. 

## 2014-02-10 ENCOUNTER — Encounter (HOSPITAL_COMMUNITY): Payer: Self-pay | Admitting: *Deleted

## 2014-02-10 ENCOUNTER — Inpatient Hospital Stay (HOSPITAL_COMMUNITY): Payer: BC Managed Care – PPO

## 2014-02-10 ENCOUNTER — Encounter: Payer: Self-pay | Admitting: Family Medicine

## 2014-02-10 DIAGNOSIS — B20 Human immunodeficiency virus [HIV] disease: Secondary | ICD-10-CM

## 2014-02-10 DIAGNOSIS — R509 Fever, unspecified: Secondary | ICD-10-CM | POA: Diagnosis present

## 2014-02-10 DIAGNOSIS — R319 Hematuria, unspecified: Secondary | ICD-10-CM

## 2014-02-10 DIAGNOSIS — M2550 Pain in unspecified joint: Secondary | ICD-10-CM

## 2014-02-10 DIAGNOSIS — E785 Hyperlipidemia, unspecified: Secondary | ICD-10-CM

## 2014-02-10 DIAGNOSIS — I10 Essential (primary) hypertension: Secondary | ICD-10-CM

## 2014-02-10 DIAGNOSIS — N179 Acute kidney failure, unspecified: Principal | ICD-10-CM | POA: Diagnosis present

## 2014-02-10 DIAGNOSIS — D649 Anemia, unspecified: Secondary | ICD-10-CM

## 2014-02-10 LAB — RENAL FUNCTION PANEL
ALBUMIN: 1.6 g/dL — AB (ref 3.5–5.2)
ANION GAP: 13 (ref 5–15)
BUN: 81 mg/dL — ABNORMAL HIGH (ref 6–23)
CHLORIDE: 109 meq/L (ref 96–112)
CO2: 15 mEq/L — ABNORMAL LOW (ref 19–32)
Calcium: 7.6 mg/dL — ABNORMAL LOW (ref 8.4–10.5)
Creatinine, Ser: 5.67 mg/dL — ABNORMAL HIGH (ref 0.50–1.35)
GFR calc Af Amer: 12 mL/min — ABNORMAL LOW (ref >90)
GFR calc non Af Amer: 10 mL/min — ABNORMAL LOW (ref >90)
Glucose, Bld: 117 mg/dL — ABNORMAL HIGH (ref 70–99)
PHOSPHORUS: 5.3 mg/dL — AB (ref 2.3–4.6)
Potassium: 5.2 mEq/L (ref 3.7–5.3)
SODIUM: 137 meq/L (ref 137–147)

## 2014-02-10 LAB — IRON AND TIBC
IRON: 20 ug/dL — AB (ref 42–135)
Saturation Ratios: 15 % — ABNORMAL LOW (ref 20–55)
TIBC: 135 ug/dL — AB (ref 215–435)
UIBC: 115 ug/dL — ABNORMAL LOW (ref 125–400)

## 2014-02-10 LAB — CBC
HCT: 23.9 % — ABNORMAL LOW (ref 39.0–52.0)
HEMOGLOBIN: 8.1 g/dL — AB (ref 13.0–17.0)
MCH: 30.1 pg (ref 26.0–34.0)
MCHC: 33.9 g/dL (ref 30.0–36.0)
MCV: 88.8 fL (ref 78.0–100.0)
Platelets: 181 10*3/uL (ref 150–400)
RBC: 2.69 MIL/uL — ABNORMAL LOW (ref 4.22–5.81)
RDW: 14.1 % (ref 11.5–15.5)
WBC: 8.3 10*3/uL (ref 4.0–10.5)

## 2014-02-10 LAB — B. BURGDORFI ANTIBODIES: B burgdorferi Ab IgG+IgM: 0.55 {ISR}

## 2014-02-10 LAB — FERRITIN: FERRITIN: 484 ng/mL — AB (ref 22–322)

## 2014-02-10 LAB — CK: Total CK: 198 U/L (ref 7–232)

## 2014-02-10 LAB — RHEUMATOID FACTOR: RHEUMATOID FACTOR: 10 [IU]/mL (ref <=14)

## 2014-02-10 MED ORDER — DOXYCYCLINE HYCLATE 100 MG IV SOLR
100.0000 mg | Freq: Two times a day (BID) | INTRAVENOUS | Status: DC
  Filled 2014-02-10: qty 100

## 2014-02-10 MED ORDER — ABACAVIR SULFATE 300 MG PO TABS
600.0000 mg | ORAL_TABLET | Freq: Every day | ORAL | Status: DC
  Administered 2014-02-10 – 2014-02-18 (×9): 600 mg via ORAL
  Filled 2014-02-10 (×9): qty 2

## 2014-02-10 MED ORDER — ATORVASTATIN CALCIUM 40 MG PO TABS
40.0000 mg | ORAL_TABLET | Freq: Every day | ORAL | Status: DC
  Administered 2014-02-10 – 2014-02-17 (×8): 40 mg via ORAL
  Filled 2014-02-10 (×9): qty 1

## 2014-02-10 MED ORDER — CIPROFLOXACIN HCL 250 MG PO TABS
250.0000 mg | ORAL_TABLET | Freq: Two times a day (BID) | ORAL | Status: DC
  Administered 2014-02-10 – 2014-02-11 (×3): 250 mg via ORAL
  Filled 2014-02-10 (×6): qty 1

## 2014-02-10 MED ORDER — SODIUM CHLORIDE 0.9 % IV SOLN
INTRAVENOUS | Status: DC
  Administered 2014-02-10 – 2014-02-11 (×2): via INTRAVENOUS

## 2014-02-10 MED ORDER — SODIUM CHLORIDE 0.9 % IJ SOLN
3.0000 mL | Freq: Two times a day (BID) | INTRAMUSCULAR | Status: DC
  Administered 2014-02-10 – 2014-02-18 (×12): 3 mL via INTRAVENOUS

## 2014-02-10 MED ORDER — POLYETHYLENE GLYCOL 3350 17 G PO PACK
17.0000 g | PACK | Freq: Every day | ORAL | Status: DC
Start: 2014-02-10 — End: 2014-02-11
  Administered 2014-02-10: 17 g via ORAL
  Filled 2014-02-10 (×2): qty 1

## 2014-02-10 MED ORDER — DOXYCYCLINE HYCLATE 100 MG IV SOLR
100.0000 mg | Freq: Once | INTRAVENOUS | Status: AC
  Administered 2014-02-10: 100 mg via INTRAVENOUS
  Filled 2014-02-10: qty 100

## 2014-02-10 MED ORDER — ACETAMINOPHEN 650 MG RE SUPP: 650.0000 mg | Freq: Four times a day (QID) | RECTAL | Status: DC | PRN

## 2014-02-10 MED ORDER — NEPRO/CARBSTEADY PO LIQD
237.0000 mL | Freq: Two times a day (BID) | ORAL | Status: DC
  Administered 2014-02-10 – 2014-02-18 (×11): 237 mL via ORAL

## 2014-02-10 MED ORDER — HEPARIN SODIUM (PORCINE) 5000 UNIT/ML IJ SOLN
5000.0000 [IU] | Freq: Three times a day (TID) | INTRAMUSCULAR | Status: DC
  Administered 2014-02-10 – 2014-02-12 (×7): 5000 [IU] via SUBCUTANEOUS
  Filled 2014-02-10 (×10): qty 1

## 2014-02-10 MED ORDER — MORPHINE SULFATE 2 MG/ML IJ SOLN
2.0000 mg | INTRAMUSCULAR | Status: DC | PRN
  Administered 2014-02-13 – 2014-02-15 (×2): 2 mg via INTRAVENOUS
  Filled 2014-02-10 (×2): qty 1

## 2014-02-10 MED ORDER — LAMIVUDINE 10 MG/ML PO SOLN
50.0000 mg | Freq: Every day | ORAL | Status: DC
  Administered 2014-02-10 – 2014-02-18 (×9): 50 mg via ORAL
  Filled 2014-02-10 (×9): qty 5

## 2014-02-10 MED ORDER — ACETAMINOPHEN 325 MG PO TABS
650.0000 mg | ORAL_TABLET | Freq: Four times a day (QID) | ORAL | Status: DC | PRN
  Administered 2014-02-10 – 2014-02-12 (×2): 650 mg via ORAL
  Filled 2014-02-10 (×2): qty 2

## 2014-02-10 MED ORDER — ABACAVIR-DOLUTEGRAVIR-LAMIVUD 600-50-300 MG PO TABS: 1.0000 | ORAL_TABLET | Freq: Every day | ORAL | Status: DC

## 2014-02-10 MED ORDER — PANTOPRAZOLE SODIUM 20 MG PO TBEC
20.0000 mg | DELAYED_RELEASE_TABLET | Freq: Every day | ORAL | Status: DC
  Administered 2014-02-10 – 2014-02-18 (×9): 20 mg via ORAL
  Filled 2014-02-10 (×9): qty 1

## 2014-02-10 MED ORDER — DOLUTEGRAVIR SODIUM 50 MG PO TABS
50.0000 mg | ORAL_TABLET | Freq: Every day | ORAL | Status: DC
  Administered 2014-02-10 – 2014-02-11 (×2): 50 mg via ORAL
  Filled 2014-02-10 (×2): qty 1

## 2014-02-10 MED ORDER — AMLODIPINE BESYLATE 10 MG PO TABS
10.0000 mg | ORAL_TABLET | Freq: Every day | ORAL | Status: DC
  Administered 2014-02-10 – 2014-02-18 (×9): 10 mg via ORAL
  Filled 2014-02-10 (×9): qty 1

## 2014-02-10 NOTE — Progress Notes (Signed)
Utilization review completed.  

## 2014-02-10 NOTE — Evaluation (Signed)
Physical Therapy Evaluation Patient Details Name: Kenneth [name] MRN: [id number] DOB: 27-May-1955 Today's Date: 02/10/2014   History of Present Illness  58 y.o. male presenting with AKI on CKD II, polyarthralgias, SIRS without clear source. 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  Clinical Impression  Pt admitted with the above. Pt currently with functional limitations due to the deficits listed below (see PT Problem List). At the time of PT eval pt was significantly limited by pain in B knees and R flank (states pain is mainly in R knee, which appears to be swollen and is hot to the touch).  Pt will benefit from skilled PT to increase their independence and safety with mobility to allow discharge to the venue listed below. Feel that pt is a good candidate for CIR at d/c to return to a mod I level of function prior to return home.       Follow Up Recommendations CIR;Supervision/Assistance - 24 hour    Equipment Recommendations  Rolling walker with 5" wheels;3in1 (PT)    Recommendations for Other Services Rehab consult     Precautions / Restrictions Precautions Precautions: Fall Precaution Comments: B knee pain Restrictions Weight Bearing Restrictions: No      Mobility  Bed Mobility Overal bed mobility: Needs Assistance Bed Mobility: Sidelying to Sit;Rolling Rolling: Supervision Sidelying to sit: Min assist       General bed mobility comments: Pt sitting up in recliner chair upon PT arrival.   Transfers Overall transfer level: Needs assistance   Transfers: Sit to/from Stand;Stand Pivot Transfers Sit to Stand: Min assist;From elevated surface Stand pivot transfers: Min assist       General transfer comment: Pt declined out of chair at this time due to pain in knees.   Ambulation/Gait                Stairs            Wheelchair Mobility    Modified Rankin (Stroke Patients  Only)       Balance Overall balance assessment: Needs assistance Sitting-balance support: Feet supported;No upper extremity supported Sitting balance-Leahy Scale: Fair     Standing balance support: During functional activity;Single extremity supported Standing balance-Leahy Scale: Fair Standing balance comment: affected by knee pain                             Pertinent Vitals/Pain Pain Assessment: 0-10 Pain Score: 9  Pain Location: Medial aspect of R knee and R flank pain Pain Descriptors / Indicators: Crushing;Cramping Pain Intervention(s): Limited activity within patient's tolerance;Monitored during session;Repositioned    Home Living Family/patient expects to be discharged to:: Private residence Living Arrangements: Alone Available Help at Discharge: Other (Comment) (Family support in [place]) Type of Home: House (Condo) Home Access: Level entry     Home Layout: One level Home Equipment: Shower seat - built in      Prior Function Level of Independence: Independent with assistive device(s)         Comments: drives; 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 Pt points out superiomedial aspect of the right knee as most  painful and this area appears swollen and is hot to the touch. Pt had extreme difficulty flexing bilat knees, however could tolerate MMT to the knee without pain (extension). Flexion testing was painful.     Cervical / Trunk Assessment: Normal  Communication   Communication: No difficulties  Cognition Arousal/Alertness: Awake/alert Behavior During Therapy: WFL for tasks assessed/performed Overall Cognitive Status: Within Functional Limits for tasks assessed                       General Comments General comments (skin integrity, edema, etc.): B knee swelling. R>L    Exercises        Assessment/Plan    PT Assessment Patient needs continued PT services  PT Diagnosis Difficulty walking;Acute pain   PT Problem List Decreased strength;Decreased range of motion;Decreased activity tolerance;Decreased balance;Decreased mobility;Decreased safety awareness;Decreased knowledge of use of DME;Decreased knowledge of precautions;Pain  PT Treatment Interventions DME instruction;Gait training;Stair training;Functional mobility training;Therapeutic activities;Therapeutic exercise;Neuromuscular re-education;Patient/family education   PT Goals (Current goals can be found in the Care Plan section) Acute Rehab PT Goals Patient Stated Goal: Find out why his knees are hurting him PT Goal Formulation: With patient/family Time For Goal Achievement: 02/24/14 Potential to Achieve Goals: Good    Frequency Min 3X/week   Barriers to discharge        Co-evaluation               End of Session   Activity Tolerance: Patient limited by pain Patient left: in chair;with call bell/phone within reach;with family/visitor present Nurse Communication: Mobility status         Time: [id number] PT Time Calculation (min) (ACUTE ONLY): 18 min   Charges:   PT Evaluation $Initial PT Evaluation Tier I: 1 Procedure PT Treatments $Therapeutic Activity: 8-22 mins   PT G Codes:          Rolinda Roan 02/10/2014, 4:28 PM   Rolinda Roan, PT, DPT Acute Rehabilitation Services Pager: [phone]

## 2014-02-10 NOTE — Progress Notes (Signed)
Occupational Therapy Evaluation Patient Details Name: GAILARD ROMULUS MRN: [id number] DOB: Nov 05, 1955 Today's Date: 02/10/2014    History of Present Illness 58 y.o. male presenting with AKI on CKD II, polyarthralgias, SIRS without clear source. 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   Clinical Impression   PTA, pt lived at home alone and states that he was having increasing difficulty functioning independently over the last 4-5 weeks. States that it would take hours for him to get ready in the am due to pain and weakness. At this time, pt is very limited by B knee and R flank pain.Discussed rehab options with pt and explained that he will need rehab in some type of venue.  At this time, pending pain and medical issues,  pt may benefit from CIR to return home @ mod i level (family is out of town). Will follow acutely to maximize functional level of independence and continue to assess discharge needs.     Follow Up Recommendations  CIR    Equipment Recommendations  3 in 1 bedside comode;Other (comment) (RW)    Recommendations for Other Services Rehab consult     Precautions / Restrictions Precautions Precautions: Fall Precaution Comments: B knee pain      Mobility Bed Mobility Overal bed mobility: Needs Assistance Bed Mobility: Sidelying to Sit;Rolling Rolling: Supervision Sidelying to sit: Min assist          Transfers Overall transfer level: Needs assistance   Transfers: Sit to/from Stand;Stand Pivot Transfers Sit to Stand: Min assist;From elevated surface; mod A from lower surface Stand pivot transfers: Min assist            Balance Overall balance assessment: Needs assistance   Sitting balance-Leahy Scale: Fair     Standing balance support: During functional activity;Single extremity supported Standing balance-Leahy Scale: Fair Standing balance comment: affected by knee pain                             ADL Overall ADL's : Needs assistance/impaired     Grooming: Set up;Sitting   Upper Body Bathing: Set up;Supervision/ safety;Sitting   Lower Body Bathing: Moderate assistance;Sit to/from stand   Upper Body Dressing : Supervision/safety;Set up;Sitting   Lower Body Dressing: Moderate assistance;Sit to/from stand   Toilet Transfer: Moderate assistance;Stand-pivot Toilet Transfer Details (indicate cue type and reason): from lower level Toileting- Water quality scientist and Hygiene: Min guard;Sit to/from stand       Functional mobility during ADLs: Moderate assistance General ADL Comments: Lengthy discussion with pt regarding PLOF. Pt states that he had increasing difficult5y with his self care over the last 4-5 weeks. States it would take him hours to get ready in the am before work. States he had difficulty with mobility so that he could not make it to the bathroom in time. Tearful at times.     Vision                     Perception     Praxis      Pertinent Vitals/Pain Pain Assessment: 0-10 Pain Score: 9  Pain Location: R flank and R knee Pain Descriptors / Indicators: Crushing;Cramping Pain Intervention(s): Limited activity within patient's tolerance;Monitored during session;Repositioned     Hand Dominance     Extremity/Trunk Assessment Upper Extremity Assessment Upper Extremity Assessment: Overall WFL for tasks assessed   Lower Extremity Assessment Lower Extremity Assessment: Defer  to PT evaluation   Cervical / Trunk Assessment Cervical / Trunk Assessment: Normal   Communication Communication Communication: No difficulties   Cognition Arousal/Alertness: Awake/alert Behavior During Therapy: WFL for tasks assessed/performed Overall Cognitive Status: Within Functional Limits for tasks assessed                     General Comments   B knee edema    Exercises       Shoulder Instructions      Home Living  Family/patient expects to be discharged to:: Private residence Living Arrangements: Alone Available Help at Discharge: Other (Comment) Type of Home: House (condo) Home Access: Level entry     Home Layout: One level     Bathroom Shower/Tub: Occupational psychologist: Standard Bathroom Accessibility: Yes How Accessible: Accessible via walker Home Equipment: Shower seat - built in          Prior Functioning/Environment Level of Independence: Independent with assistive device(s)        Comments: drives; 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    OT Diagnosis: Generalized weakness;Acute pain   OT Problem List: Decreased strength;Decreased range of motion;Decreased activity tolerance;Impaired balance (sitting and/or standing);Decreased knowledge of use of DME or AE;Pain;Increased edema   OT Treatment/Interventions: Self-care/ADL training;Therapeutic exercise;Energy conservation;DME and/or AE instruction;Therapeutic activities;Patient/family education;Balance training    OT Goals(Current goals can be found in the care plan section) Acute Rehab OT Goals Patient Stated Goal: to figure out what's going on  OT Goal Formulation: With patient Time For Goal Achievement: 02/24/14 Potential to Achieve Goals: Good  OT Frequency: Min 2X/week   Barriers to D/C: Decreased caregiver support;Other (comment) (Appears majority of family lives in [place])          Co-evaluation              End of Session Nurse Communication: Mobility status  Activity Tolerance: Patient tolerated treatment well Patient left: in chair;with call bell/phone within reach   Time: 1343-1412 OT Time Calculation (min): 29 min Charges:  OT General Charges $OT Visit: 1 Procedure OT Evaluation $Initial OT Evaluation Tier I: 1 Procedure G-Codes:    Jizel Cheeks,HILLARY 16-Feb-2014, 3:03 PM   Maurie Boettcher, OTR/L  [phone] 02/16/2014

## 2014-02-10 NOTE — Progress Notes (Signed)
Niece was at the hospital with uncle and did not realize there you are his pcp.  She needs to speak to you.  Her number is [phone]  Merilyn Baba

## 2014-02-10 NOTE — Progress Notes (Signed)
Rehab Admissions Coordinator Note:  Patient was screened by [name] for appropriateness for an Inpatient Acute Rehab Consult.  At this time, we are recommending Inpatient Rehab consult.  [name] 02/10/2014, 3:39 PM  I can be reached at [phone].

## 2014-02-10 NOTE — Progress Notes (Addendum)
INITIAL NUTRITION ASSESSMENT  Pt meets criteria for SEVERE MALNUTRITION in the context of chronic illness as evidenced by a 7.6% weight loss in one month, energy intake </= 75% for >/= 1 month, and severe muscle mass depletion.  DOCUMENTATION CODES Per approved criteria  -Severe malnutrition in the context of chronic illness -Underweight   INTERVENTION: Provide Nepro Shake po BID, each supplement provides 425 kcal and 19 grams protein.  Encourage PO intake.  NUTRITION DIAGNOSIS: Increased nutrient needs related to acute kidney injury as evidenced by estimated nutrition needs.   Goal: Pt to meet >/= 90% of their estimated nutrition needs   Monitor:  PO intake, weight trends, labs, I/O's  Reason for Assessment: MST  58 y.o. male  Admitting Dx: AKI (acute kidney injury)  ASSESSMENT: Pt presenting with AKI on CKD II, polyarthralgias, SIRS without clear source. 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 Pt concerning of nephritis or nephrotic syndrome per MD note.  Pt reports his appetite has been improving. Meal completion is 50%. Pt reports he has been having a decreased appetite over the past 2-3 months. Pt reports he has only been eating 1 meal a day as he reports not usually being hungry. Pt also reports he has been drinking Ensure at home once a day. Pt reports prior to his decreased appetite, he was eating great. Pt reports losing weight. Noted per [facility] weight records, pt with a 7.6% weight loss in one month. Pt is agreeable on oral supplements. Will order Nepro. Pt was encouraged to eat his food at meals and to drink his supplements.   Nutrition Focused Physical Exam:  Subcutaneous Fat:  Orbital Region: N/A Upper Arm Region: Moderate depletion Thoracic and Lumbar Region: Moderate depetion  Muscle:  Temple Region: Moderate depletion Clavicle Bone Region: Severe depletion Clavicle and Acromion Bone  Region: Severe depletion Scapular Bone Region: N/A Dorsal Hand: WNL Patellar Region: WNL Anterior Thigh Region: Moderate to severe depletion Posterior Calf Region: WNL  Edema: none  Labs: Low CO2, calcium, and GFR. High BUN, creatinine, phosphorous (5.3).  Height: Ht Readings from Last 1 Encounters:  02/10/14 6\' 6"  (1.981 m)    Weight: Wt Readings from Last 1 Encounters:  02/10/14 145 lb 9.6 oz (66.044 kg)    Ideal Body Weight: 214 lbs  % Ideal Body Weight: 68%  Wt Readings from Last 10 Encounters:  02/10/14 145 lb 9.6 oz (66.044 kg)  02/02/14 146 lb (66.225 kg)  01/20/14 155 lb 4.8 oz (70.444 kg)  01/02/14 157 lb (71.215 kg)  07/24/13 144 lb (65.318 kg)  03/05/13 158 lb (71.668 kg)  01/14/13 158 lb (71.668 kg)  07/03/12 160 lb (72.576 kg)  05/30/12 158 lb (71.668 kg)  02/12/12 161 lb (73.029 kg)    Usual Body Weight: 157 lbs   % Usual Body Weight: 92%  BMI:  Body mass index is 16.83 kg/(m^2). Underweight  Estimated Nutritional Needs: Kcal: 2200-2400 Protein: 100-115 grams Fluid: 1.2 L/day  Skin: intact  Diet Order: Diet renal W/1235mL fluid restriction  EDUCATION NEEDS: -No education needs identified at this time   Intake/Output Summary (Last 24 hours) at 02/10/14 1132 Last data filed at 02/10/14 1021  Gross per 24 hour  Intake    590 ml  Output    275 ml  Net    315 ml    Last BM: 11/8  Labs:   Recent Labs Lab 02/09/14 2258 02/10/14 0445  NA 136* 137  K 6.0* 5.2  CL 105 109  CO2 16* 15*  BUN 87* 81*  CREATININE 6.28* 5.67*  CALCIUM 8.4 7.6*  PHOS  --  5.3*  GLUCOSE 114* 117*    CBG (last 3)  No results for input(s): GLUCAP in the last 72 hours.  Scheduled Meds: . abacavir  600 mg Oral Daily  . amLODipine  10 mg Oral Daily  . atorvastatin  40 mg Oral q1800  . dolutegravir  50 mg Oral Daily  . heparin subcutaneous  5,000 Units Subcutaneous 3 times per day  . lamiVUDine  50 mg Oral Daily  . pantoprazole  20 mg Oral Daily   . polyethylene glycol  17 g Oral Daily  . sodium chloride  3 mL Intravenous Q12H    Continuous Infusions: . sodium chloride 125 mL/hr at 02/10/14 [id number]    Past Medical History  Diagnosis Date  . HIV infection   . Hyperlipidemia   . Hypertension   . Insomnia   . Dysphagia   . Achalasia   . Renal abscess     Stage II kidney disease  . Hemorrhoids, internal   . Arthritis     Past Surgical History  Procedure Laterality Date  . Nephrectomy      Right    Kallie Locks, MS, RD, LDN Pager # [phone] After hours/ weekend pager # [phone]

## 2014-02-10 NOTE — H&P (Signed)
[facility] Admission History and Physical Service Pager: [phone]  Patient name: Kenneth [name] Medical record number: [id number] Date of birth: May 25, 1955 Age: 58 y.o. Gender: male  Primary Care Provider: Cordelia Poche, MD Consultants: Nephrology, ID (to be consulted in AM) Code Status: full code  Chief Complaint: joint pain  Assessment and Plan: [name] is a 58 y.o. male presenting with AKI on CKD II, polyarthralgias, SIRS without clear source. 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  # AKI on CKD II: Cr 6.28 on admission (baseline ~1.2, recently 1.37 in 8/15). S/p R nephrectomy in 2011 for congenital hydronephrosis and non-functioning kidney. CK 198 on admission, so doubt rhabdo as cause. UA with 100 protein and 7-10 RBCs, concerning for nephritis of some type or nephrotic syndrome but no edema on exam.  Possible that decreased PO intake also contributing.  HARRT medications and HIV can lead to renal failure, as well. - Admit to tele under FPTS, attending [facility] in AM - IVF: NS @ 194m/hr - Renal [place]- daily weights and strict Is&Os - Renal panel ordered  # Polyarthralgias: ~5wk history of joint pain primarily in knees/ankles/hands.  No appreciable joint swelling or redness on exam.  No rashes noted.  ESR and CRP elevated to 135 and 8.3 respectively on 02/02/14.  Differential includes lyme disease, parvovirus, HIV (previosuly very low viral loads, compliant with HARRT), lupus, HCV (HCV Ab recently negative), seronegative spondyloarthropathies.  - Follow up Lyme titer, sent by ED - Continue Doxycycline 1020mBID - Morphine 75m26m2h prn for pain control - Consider discussing further laboratory testing with Rheumatology in AM - Patient will likely need to be started on Prednisone, will hold off O/N pending possible input from rheum - PT/OT c/s  # SIRS,  unclear etiology: Patient febrile to 101.1 per report of ED PA, though not recorded in chart. Mildly tachycardic to 108 in ED.  WBC 10.9.  UA nitrite neg and trace leukocytes. CXR clear. - Will hold off on broadening antibiotics, given no definite source, well-appearing on exam, and softly meeting criteria for SIRS.   - Continue Doxycycline 100m4mD - BCx ordered (collected after initiation of Doxycycline) - f/u UCx - monitor fever curve, WBC - monitor on telemetry  # Hyperkalemia: K 6.0 on admission. Mildly peaked T waves on EKG. - f/u Renal panel - Patient does not require kayexylate/insulin/albuterol/Ca gluconate currently.  Will initiate if repeat K continues to rise. - Monitor on telemetry  # HIV disease: Recent CD4 count 390 and viral load <20 (11/11/13) - Consult ID in AM - Hold HAART until ID/Nephrology can weigh in, as this is a possible cause of AKI  # Normocytic Anemia: Hgb 9.7 on admission (baseline 11-12.5). Likely related to renal disease. - Continue to monitor - f/u repeat CBC  # HTN: Currently mildly elevated, likely from missing medications today while in the ED - Continue home amlodipine - Continue to monitor closely in setting of AKI and SIRS  # HLD: Continue home Lipitor 40mg975mly  # H/o achalasia: s/p laprascopic myotomy and fundoplication. Stable without complaints currently - Continue home Protonix  FEN/GI: Renal diet, NS @ 125mL/875mrophylaxis: subq heparin per pharm (for renal dosing)  Disposition: Admit to tele bed under FPTS, attending [facility]. Dispo pending further w/u of AKI and polyarthralgias.  History of Present Illness: Kenneth DENZER58 y.o34male presenting with joint  pain. 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  Patient reports progressively worsening diffuse, migratory joint pain, primarily in b/l knees, ankles, and hands x~5wks.  He has tried taking Tramadol  without relief.  He has only taken NSAIDs twice during this time.  He has had intermittent swelling in these joints, but deneis any currently.  Joints are occasionally stiff and worse at night or after sitting for long periods of time.  Reports red rash over ankle associated with swelling previously, but deneis any current rash.  Came to the ED today for acute worsening of joint pain x2-3 days. Of note, he has been referred to rheumatology as an outpt by his PCP Dr. Awanda Mink, but has not yet gotten an appointment.  He has been feeling otherwise well, with the exception of malaise and fatigue.  He denies any fevers/chills, N/V/D.  He has been driving back and forth to [place] several times recently, but denies being in the woods or any tick bites.  Reports decreased PO intake and darker colored urine, but deneis any decreased UOP, dysuria, abd pain.  In the ED: 3x 1L NS bolus, 60mq Na Bicarb, Doxycycline   Review Of Systems: Per HPI with the following additions: none Otherwise 12 point review of systems was performed and was unremarkable.  Patient Active Problem List   Diagnosis Date Noted  . Hematuria 02/10/2014  . Acute renal failure syndrome   . Fever   . AKI (acute kidney injury) 02/09/2014  . Arthralgia of multiple sites, bilateral 02/02/2014  . Joint swelling 02/01/2014  . Bilateral low back pain without sciatica 02/01/2014  . Chronic kidney disease (CKD), stage II (mild) 01/12/2010  . EXTERNAL HEMORRHOIDS WITHOUT MENTION COMP 01/20/2009  . RENAL ABSCESS 01/15/2009  . Anemia 08/31/2006  . HIV disease 07/24/2006  . Hyperlipidemia 05/31/2006  . ERECTILE DYSFUNCTION 05/31/2006  . Essential hypertension 05/31/2006  . INSOMNIA NOS 05/31/2006  . DYSPHAGIA 05/31/2006   Past Medical History: Past Medical History  Diagnosis Date  . HIV infection   . Hyperlipidemia   . Hypertension   . Insomnia   . Dysphagia   . Achalasia   . Renal abscess     Stage II kidney disease  . Hemorrhoids,  internal   . Arthritis    Past Surgical History: Past Surgical History  Procedure Laterality Date  . Nephrectomy      Right   Social History: History  Substance Use Topics  . Smoking status: Never Smoker   . Smokeless tobacco: Never Used  . Alcohol Use: No   Additional social history: no tobacco, EToh, drugs  Please also refer to relevant sections of EMR.  Family History: Family History  Problem Relation Age of Onset  . Colon cancer Father    Allergies and Medications: Allergies  Allergen Reactions  . Amoxicillin     REACTION: diffuse rash   No current facility-administered medications on file prior to encounter.   Current Outpatient Prescriptions on File Prior to Encounter  Medication Sig Dispense Refill  . Abacavir-Dolutegravir-Lamivud 600-50-300 MG TABS Take 1 tablet by mouth daily. 90 tablet 6  . amLODipine (NORVASC) 10 MG tablet Take 1 tablet (10 mg total) by mouth daily. 30 tablet 11  . atorvastatin (LIPITOR) 40 MG tablet TAKE 1/2 TABLET BY MOUTH DAILY (Patient taking differently: Take 1/2 tablet by mouth daily) 30 tablet 11  . traMADol (ULTRAM) 50 MG tablet Take 1 tablet (50 mg total) by mouth  every 8 (eight) hours as needed. (Patient taking differently: Take 50 mg by mouth every 8 (eight) hours as needed for moderate pain. ) 60 tablet 0  . pantoprazole (PROTONIX) 20 MG tablet Take 1 tablet (20 mg total) by mouth daily. 14 tablet 0    Objective: BP 159/89 mmHg  Pulse 100  Temp(Src) 99 F (37.2 C) (Oral)  Resp 17  Ht _0  (1.981 m)  Wt 145 lb 9.6 oz (66.044 kg)  BMI 16.83 kg/m2  SpO2 100% Exam: General: Pleasant AA male laying in bed, appears to be in some pain but answers all questions appropriately HEENT: NCAT, EOMI, dry MM Cardiovascular: Tachycardic, regular rhythm, no murmurs appreciated Respiratory: CTAB, no w/r/c. Normal WOB. Abdomen: Soft, NDNT. +BS, no masses or organomegaly Extremities: WWP, no edema, no calf tenderness. MSK: b/l ankles/knees  diffusely TTP. No effusions or swelling appreciated. No muscular tenderness. Skin: No rashes appreciated Neuro: CN 2-12 grossly intact. Speech normal. A&O, no focal deficits  Labs and Imaging: CBC BMET   Recent Labs Lab 02/09/14 2258  WBC 10.9*  HGB 9.7*  HCT 28.4*  PLT 207    Recent Labs Lab 02/09/14 2258  NA 136*  K 6.0*  CL 105  CO2 16*  BUN 87*  CREATININE 6.28*  GLUCOSE 114*  CALCIUM 8.4      Urinalysis    Component Value Date/Time   COLORURINE YELLOW 02/09/2014 2317   APPEARANCEUR CLOUDY* 02/09/2014 2317   LABSPEC 1.017 02/09/2014 2317   PHURINE 5.0 02/09/2014 2317   GLUCOSEU NEGATIVE 02/09/2014 2317   GLUCOSEU NEG mg/dL 08/16/2006 2308   HGBUR LARGE* 02/09/2014 2317   BILIRUBINUR NEGATIVE 02/09/2014 2317   KETONESUR NEGATIVE 02/09/2014 2317   PROTEINUR 100* 02/09/2014 2317   UROBILINOGEN 1.0 02/09/2014 2317   NITRITE NEGATIVE 02/09/2014 2317   LEUKOCYTESUR TRACE* 02/09/2014 2317     CK 198 Lactic acid 0.67  CXR (11/9): No acute disease.  EKG (11/9): Sinus rhythm, PVCs, ST elevation in V4-V6, peaked T waves  Lavon Paganini, MD 02/10/2014, 3:32 AM PGY-1, [facility] pager: [phone], text pages welcome  FPTS Upper-Level Resident Addendum  I have independently interviewed and examined the patient. I have discussed the above with Dr. Brita Romp and agree with her documentation as above. The above reflects her original note with my edits for correction/additions/clarification in orange. Please see also any attending notes.   Emmaline Kluver, MD PGY-3, [facility] pager: [phone] (text pages welcome through [facility])

## 2014-02-10 NOTE — Consult Note (Signed)
Reason for Consult: AKI Referring Physician: Dr. Gerlean Ren  HPI: Kenneth [name] is an 58 y.o. male with PMH HIV, right nephrectomy s/p pyelonephritis and non-functioning kidney, hypertension. Admitted on 02/09/2014 for AKI. Patient states he has been having joint pain and swelling for the past 5 weeks. His daily intake has been decreased because he has not felt like doing anything with the pain he feels, including eating/drinking. He reports some issues with loose stools. He states that other than his nephrectomy, he has not had any issues with his kidneys. He may have had exposure to NSAIDs recently, but he is unsure of the pain medication he was taking. No dark stools. Patient has a baseline creatinine of 1.0-1.1- was noted to have a creatinine of 1.37 in August with unclear circumstance.  He now presents with knee pain but then he says pain in other joints as well.  He has just been laying around not eating- he did take aleve but said not much - also got a pain med from a family member that he was taking but doesn't know what it was.  On arrival, he was not hypotensive but felt to be dry- he has been hydrated.  U/S is unremarkable- U/A did show microscopic hematuria and a small amount of protein.  He was talking antiretrovirals but none that cause AKI.  Creatinine is slightly better today- UOP seems reasonable    Trend in Creatinine: CREAT  Date/Time Value Ref Range Status  11/11/2013 11:08 AM 1.37* 0.50 - 1.35 mg/dL Final  07/10/2013 03:29 PM 1.04 0.50 - 1.35 mg/dL Final  01/02/2013 09:21 AM 1.11 0.50 - 1.35 mg/dL Final  06/19/2012 11:23 AM 1.13 0.50 - 1.35 mg/dL Final  12/13/2011 10:33 AM 1.16 0.50 - 1.35 mg/dL Final  05/29/2011 11:28 AM 1.19 0.50 - 1.35 mg/dL Final  12/13/2010 10:14 AM 1.24 0.50 - 1.35 mg/dL Final  08/31/2010 10:40 AM 1.21 0.40 - 1.50 mg/dL Final  07/19/2010 10:29 AM 1.24 0.40 - 1.50 mg/dL Final   CREATININE, SER  Date/Time Value Ref Range Status  02/10/2014 04:45 AM 5.67*  0.50 - 1.35 mg/dL Final  02/09/2014 10:58 PM 6.28* 0.50 - 1.35 mg/dL Final  06/09/2013 01:39 PM 1.01 0.50 - 1.35 mg/dL Final  04/05/2010 10:32 PM 1.10 0.40-1.50 mg/dL Final  12/22/2009 07:51 PM 1.24 0.40-1.50 mg/dL Final  09/08/2009 06:51 PM 1.23 0.40-1.50 mg/dL Final  06/11/2009 07:23 PM 1.44 (0.40-1.50 mg/dL Final  04/17/2009 05:42 AM 1.71* 0.4 - 1.5 mg/dL Final  04/16/2009 05:20 AM 1.55* 0.4 - 1.5 mg/dL Final  04/15/2009 03:42 PM 1.46 0.4 - 1.5 mg/dL Final  04/12/2009 01:40 PM 1.28 0.4 - 1.5 mg/dL Final  02/23/2009 08:39 PM 1.03 (0.40-1.50 mg/dL Final  01/15/2009 07:55 PM 1.67* (0.40-1.50 mg/dL Final  01/09/2009 04:30 AM 1.41 0.4 - 1.5 mg/dL Final  01/08/2009 05:30 AM 1.50 0.4 - 1.5 mg/dL Final  01/07/2009 05:47 AM 1.54* 0.4 - 1.5 mg/dL Final  01/06/2009 04:46 AM 1.65* 0.4 - 1.5 mg/dL Final  01/05/2009 04:47 AM 1.74* 0.4 - 1.5 mg/dL Final  01/04/2009 11:10 AM 1.70* 0.4 - 1.5 mg/dL Final  01/03/2009 12:05 PM 1.82 DELTA CHECK NOTED* 0.4 - 1.5 mg/dL Final  01/02/2009 04:05 AM 4.45* 0.4 - 1.5 mg/dL Final  01/01/2009 04:52 PM 5.99* 0.4 - 1.5 mg/dL Final  01/01/2009 03:05 PM 6.5* 0.4 - 1.5 mg/dL Final  09/17/2008 10:41 PM 1.40 0.40-1.50 mg/dL Final  07/16/2008 08:33 PM 1.29 0.40-1.50 mg/dL Final  01/08/2008 09:25 PM 1.15 0.40-1.50 mg/dL Final  05/14/2007  06:32 PM 1.53* 0.40-1.50 mg/dL Final  02/19/2007 07:16 PM 1.37 0.40-1.50 mg/dL Final  11/12/2006 08:08 PM 1.25 0.40-1.50 mg/dL Final  08/16/2006 11:08 PM 1.17 0.40-1.50 mg/dL Final  08/01/2006 10:30 PM 1.27 0.40-1.50 mg/dL Final  07/24/2006 08:05 PM 1.46 0.40-1.50 mg/dL Final    PMH:   Past Medical History  Diagnosis Date  . HIV infection   . Hyperlipidemia   . Hypertension   . Insomnia   . Dysphagia   . Achalasia   . Renal abscess     Stage II kidney disease  . Hemorrhoids, internal   . Arthritis     PSH:   Past Surgical History  Procedure Laterality Date  . Nephrectomy      Right    Allergies:  Allergies   Allergen Reactions  . Amoxicillin     REACTION: diffuse rash    Medications:   Prior to Admission medications   Medication Sig Start Date End Date Taking? Authorizing Provider  Abacavir-Dolutegravir-Lamivud [id number] MG TABS Take 1 tablet by mouth daily. 07/24/13  Yes Campbell Riches, MD  acetaminophen (TYLENOL) 500 MG tablet Take 500 mg by mouth every 6 (six) hours as needed for mild pain.   Yes Historical Provider, MD  amLODipine (NORVASC) 10 MG tablet Take 1 tablet (10 mg total) by mouth daily. 01/20/14  Yes Cordelia Poche, MD  atorvastatin (LIPITOR) 40 MG tablet TAKE 1/2 TABLET BY MOUTH DAILY Patient taking differently: Take 1/2 tablet by mouth daily 02/14/13  Yes Cordelia Poche, MD  cyclobenzaprine (FLEXERIL) 5 MG tablet Take 5 mg by mouth 3 (three) times daily as needed for muscle spasms.   Yes Historical Provider, MD  traMADol (ULTRAM) 50 MG tablet Take 1 tablet (50 mg total) by mouth every 8 (eight) hours as needed. Patient taking differently: Take 50 mg by mouth every 8 (eight) hours as needed for moderate pain.  02/02/14  Yes Bryan R Hess, DO  pantoprazole (PROTONIX) 20 MG tablet Take 1 tablet (20 mg total) by mouth daily. 06/09/13   Dorie Rank, MD    Inpatient medications: . abacavir  600 mg Oral Daily  . amLODipine  10 mg Oral Daily  . atorvastatin  40 mg Oral q1800  . ciprofloxacin  250 mg Oral BID  . dolutegravir  50 mg Oral Daily  . heparin subcutaneous  5,000 Units Subcutaneous 3 times per day  . lamiVUDine  50 mg Oral Daily  . pantoprazole  20 mg Oral Daily  . polyethylene glycol  17 g Oral Daily  . sodium chloride  3 mL Intravenous Q12H    Discontinued Meds:   Medications Discontinued During This Encounter  Medication Reason  . doxycycline (VIBRAMYCIN) 100 mg in dextrose 5 % 250 mL IVPB   . Abacavir-Dolutegravir-Lamivud 600-50-300 MG TABS 1 tablet Formulary change    Social History:  reports that he has never smoked. He has never used smokeless tobacco. He reports  that he does not drink alcohol or use illicit drugs.  Family History:   Family History  Problem Relation Age of Onset  . Colon cancer Father     Pertinent items are noted in HPI. Weight change:   Intake/Output Summary (Last 24 hours) at 02/10/14 1343 Last data filed at 02/10/14 1042  Gross per 24 hour  Intake    830 ml  Output    275 ml  Net    555 ml   BP 152/82 mmHg  Pulse 105  Temp(Src) 99.2 F (37.3 C) (Oral)  Resp  17  Ht _0  (1.981 m)  Wt 145 lb 9.6 oz (66.044 kg)  BMI 16.83 kg/m2  SpO2 99% Filed Vitals:   02/10/14 0130 02/10/14 0603 02/10/14 0851 02/10/14 1021  BP: 159/89 137/76 144/78 152/82  Pulse: 100 91 96 105  Temp: 99 F (37.2 C) 98.6 F (37 C) 98.8 F (37.1 C) 99.2 F (37.3 C)  TempSrc: Oral Oral Oral Oral  Resp: _1 Height: _2  (1.981 m)     Weight: 145 lb 9.6 oz (66.044 kg)     SpO2: 100% 99% 99% 99%     Gen: ill appearing, looks tired. No distress Neck: no JVD Resp: clear to auscultation bilaterally CVS: RRR, no murmur Abd: soft, non-tender Ext: no edema, significant joint effusion of right knee. Moild effusion of left knee  Labs: Basic Metabolic Panel:  Recent Labs Lab 02/09/14 2258 02/10/14 0445  NA 136* 137  K 6.0* 5.2  CL 105 109  CO2 16* 15*  GLUCOSE 114* 117*  BUN 87* 81*  CREATININE 6.28* 5.67*  ALBUMIN 2.0* 1.6*  CALCIUM 8.4 7.6*  PHOS  --  5.3*   Liver Function Tests:  Recent Labs Lab 02/09/14 2258 02/10/14 0445  AST 19  --   ALT 8  --   ALKPHOS 59  --   BILITOT 0.2*  --   PROT 8.8*  --   ALBUMIN 2.0* 1.6*   No results for input(s): LIPASE, AMYLASE in the last 168 hours. No results for input(s): AMMONIA in the last 168 hours. CBC:  Recent Labs Lab 02/09/14 2258 02/10/14 0445  WBC 10.9* 8.3  NEUTROABS 8.3*  --   HGB 9.7* 8.1*  HCT 28.4* 23.9*  MCV 88.8 88.8  PLT 207 181    Recent Labs Lab 02/09/14 2347  CKTOTAL 198   CBG: No results for input(s): GLUCAP in the last 168  hours.  Iron Studies: No results for input(s): IRON, TIBC, TRANSFERRIN, FERRITIN in the last 168 hours.  Xrays/Other Studies: Dg Chest 2 View  02/09/2014   CLINICAL DATA:  Multifocal joint pain for 4-5 weeks.  EXAM: CHEST  2 VIEW  COMPARISON:  PA and lateral chest 06/09/2013.  FINDINGS: The lungs are clear. Heart size is upper normal. No pneumothorax or pleural effusion. No focal bony abnormality. Surgical clips left upper quadrant of the abdomen are noted.  IMPRESSION: No acute disease.   Electronically Signed   By: Inge Rise M.D.   On: 02/09/2014 23:50   US Renal  02/10/2014   CLINICAL DATA:  Acute renal injury, status post right nephrectomy  EXAM: RENAL/URINARY TRACT ULTRASOUND COMPLETE  COMPARISON:  01/02/2009  FINDINGS: Right Kidney:  Surgically removed  Left Kidney:  Length: 15.7 cm. Echogenicity within normal limits. No mass or hydronephrosis visualized.  Bladder:  Appears normal for degree of bladder distention.  Incidental note is made of cholelithiasis.  IMPRESSION: Status post right nephrectomy.  Cholelithiasis  No acute abnormality is noted.   Electronically Signed   By: [name] M.D.   On: 02/10/2014 09:54     Assessment/Plan: 1. AKI: Baseline creatinine of 1.1-1.2. Creatinine on admission of 6.28 that is down to 5.67 today (11/10).  Patient is currently receiving NS IVF at maintenance rate. Renal ultrasound (11/10) significant for s/p right nephrectomy. Suspect recent history of decreased PO intake in addition to patient having one kidney probably contributed to AKI. Do not suspect HAART therapy caused this acute injury. Creatinine shows improvement already in less  than 12 hours after IV fluids.  1. Strict intake and ouput 2. Continue IVF 3. Follow renal function- will also follow up on serologies as hematuria not completely expected 2. Polyarthralgia: management per primary. Appears to be working up for rheumatologic cause- does not appear to have synovitis 3. Normocytic  anemia: baseline around 11-13. Down to 8.1 today.  Last iron panel in 2010 suggests anemia of chronic disease. No history of melena or bright red blood per rectum. 1. Obtain Iron panel 4. Hematuria: microscopic. Concern this could be intrinsic kidney injury however patient also with evidence of possible UTI. C3, C4, ASO pending 5. UTI: on cipro per primary  6. Hyperkalemia: resolved 7. HIV: on abacavir, dolutegravir and lamivudine. Recommend continuing therapy. Primary team and infectious disease managing 8. Hypertension: on amlodipine. Continue per primary   Cordelia Poche, MD PGY-2, [facility]  Patient seen and examined, agree with above note with above modifications. 58 year old BM with HIV and HTN- his creatinine which is normal at baseline seemed to be trending up in August for unclear reasons.  He now presents with joint pains- found to have an elevated ESR and AKI- U/A shows microscopic hematuria- U/S is unremarkable- creatnine slightly better today.  Serologies pending  Corliss Parish, MD 02/10/2014    02/10/2014, 1:44 PM

## 2014-02-10 NOTE — Progress Notes (Signed)
PT Cancellation Note  Patient Details Name: Kenneth [name] MRN: [id number] DOB: 1955/10/03   Cancelled Treatment:    Reason Eval/Treat Not Completed: Patient at procedure or test/unavailable. Pt was leaving unit for HD as PT arrived. Will continue to check back as schedule allows to complete PT eval.    Rolinda Roan 02/10/2014, 9:31 AM   Rolinda Roan, PT, DPT Acute Rehabilitation Services Pager: [phone]

## 2014-02-10 NOTE — Progress Notes (Signed)
Pt admitted to the unit. Pt is alert and oriented. Pt oriented to room, staff, and call bell. Educated on fall safety plan. Bed in lowest position. Full assessment to Epic. Call bell with in reach. Educated to call for assists. Will continue to monitor. Page to [facility] at [phone] via [place] for notification of arrival.  Mady Gemma, RN

## 2014-02-11 DIAGNOSIS — E43 Unspecified severe protein-calorie malnutrition: Secondary | ICD-10-CM | POA: Diagnosis present

## 2014-02-11 DIAGNOSIS — M3214 Glomerular disease in systemic lupus erythematosus: Secondary | ICD-10-CM | POA: Diagnosis present

## 2014-02-11 LAB — BASIC METABOLIC PANEL
Anion gap: 13 (ref 5–15)
BUN: 74 mg/dL — AB (ref 6–23)
CALCIUM: 7.8 mg/dL — AB (ref 8.4–10.5)
CO2: 13 mEq/L — ABNORMAL LOW (ref 19–32)
Chloride: 112 mEq/L (ref 96–112)
Creatinine, Ser: 5.29 mg/dL — ABNORMAL HIGH (ref 0.50–1.35)
GFR calc Af Amer: 13 mL/min — ABNORMAL LOW (ref >90)
GFR calc non Af Amer: 11 mL/min — ABNORMAL LOW (ref >90)
GLUCOSE: 91 mg/dL (ref 70–99)
Potassium: 5.8 mEq/L — ABNORMAL HIGH (ref 3.7–5.3)
Sodium: 138 mEq/L (ref 137–147)

## 2014-02-11 LAB — SYNOVIAL CELL COUNT + DIFF, W/ CRYSTALS
Crystals, Fluid: NONE SEEN
Eosinophils-Synovial: 0 % (ref 0–1)
Lymphocytes-Synovial Fld: 10 % (ref 0–20)
Monocyte-Macrophage-Synovial Fluid: 10 % — ABNORMAL LOW (ref 50–90)
NEUTROPHIL, SYNOVIAL: 80 % — AB (ref 0–25)
WBC, Synovial: 1025 /mm3 — ABNORMAL HIGH (ref 0–200)

## 2014-02-11 LAB — URINE CULTURE
Colony Count: NO GROWTH
Culture: NO GROWTH

## 2014-02-11 LAB — CREATININE CLEARANCE, URINE, 24 HOUR
COLLECTION INTERVAL-CRCL: 24 h
CREATININE, URINE: 82.64 mg/dL
CREATININE: 5.29 mg/dL — AB (ref 0.50–1.35)
Creatinine Clearance: 20 mL/min — ABNORMAL LOW (ref 75–125)
Creatinine, 24H Ur: 1488 mg/d (ref 800–2000)
URINE TOTAL VOLUME-CRCL: 1800 mL

## 2014-02-11 LAB — ANTI-NUCLEAR AB-TITER (ANA TITER): ANA Titer 1: 1:1280 {titer} — ABNORMAL HIGH

## 2014-02-11 LAB — ANTI-DNA ANTIBODY, DOUBLE-STRANDED: ds DNA Ab: 6210 IU/mL — ABNORMAL HIGH

## 2014-02-11 LAB — CBC
HCT: 23.6 % — ABNORMAL LOW (ref 39.0–52.0)
Hemoglobin: 8 g/dL — ABNORMAL LOW (ref 13.0–17.0)
MCH: 30 pg (ref 26.0–34.0)
MCHC: 33.9 g/dL (ref 30.0–36.0)
MCV: 88.4 fL (ref 78.0–100.0)
Platelets: 177 10*3/uL (ref 150–400)
RBC: 2.67 MIL/uL — ABNORMAL LOW (ref 4.22–5.81)
RDW: 14.1 % (ref 11.5–15.5)
WBC: 9.4 10*3/uL (ref 4.0–10.5)

## 2014-02-11 LAB — B. BURGDORFI ANTIBODIES BY WB
B BURGDORFERI IGG ABS (IB): NEGATIVE
B BURGDORFERI IGM ABS (IB): NEGATIVE

## 2014-02-11 LAB — PROTEIN, URINE, 24 HOUR
Collection Interval-UPROT: 24 hours
PROTEIN 24H UR: 2808 mg/d — AB (ref 50–100)
PROTEIN, URINE: 156 mg/dL
Urine Total Volume-UPROT: 1800 mL

## 2014-02-11 LAB — ANA: Anti Nuclear Antibody(ANA): POSITIVE — AB

## 2014-02-11 LAB — C3 COMPLEMENT: C3 COMPLEMENT: 31 mg/dL — AB (ref 90–180)

## 2014-02-11 LAB — ANTISTREPTOLYSIN O TITER: ASO: 114 IU/mL (ref <409)

## 2014-02-11 LAB — C4 COMPLEMENT: COMPLEMENT C4, BODY FLUID: 4 mg/dL — AB (ref 10–40)

## 2014-02-11 MED ORDER — PREDNISONE 20 MG PO TABS
40.0000 mg | ORAL_TABLET | Freq: Every day | ORAL | Status: DC
  Administered 2014-02-12: 40 mg via ORAL
  Filled 2014-02-11 (×4): qty 2

## 2014-02-11 MED ORDER — SODIUM CHLORIDE 0.9 % IV SOLN
125.0000 mg | Freq: Every day | INTRAVENOUS | Status: AC
  Administered 2014-02-12: 125 mg via INTRAVENOUS
  Filled 2014-02-11 (×3): qty 10

## 2014-02-11 MED ORDER — POLYETHYLENE GLYCOL 3350 17 G PO PACK
17.0000 g | PACK | Freq: Two times a day (BID) | ORAL | Status: DC
  Administered 2014-02-11 – 2014-02-17 (×9): 17 g via ORAL
  Filled 2014-02-11 (×16): qty 1

## 2014-02-11 MED ORDER — DOLUTEGRAVIR SODIUM 50 MG PO TABS: 50.0000 mg | ORAL_TABLET | Freq: Two times a day (BID) | ORAL | Status: DC

## 2014-02-11 MED ORDER — DOLUTEGRAVIR SODIUM 50 MG PO TABS
50.0000 mg | ORAL_TABLET | Freq: Two times a day (BID) | ORAL | Status: DC
  Administered 2014-02-11 – 2014-02-18 (×14): 50 mg via ORAL
  Filled 2014-02-11 (×18): qty 1

## 2014-02-11 NOTE — Consult Note (Signed)
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 Ree Kida   HPI: Kenneth [name] is an 58 y.o. male with perfectly controlled HIV with [facility]  Lab Results  Component Value Date   HIV1RNAQUANT <20 11/11/2013    And  Lab Results  Component Value Date   CD4TABS 66* 11/11/2013   CD4TABS 410 07/10/2013   CD4TABS 410 01/02/2013   Who had CKD, and is sp nephrectomy who  Has been admitted with ARF with ANA + 1: 6210, currently on prednisone. We were consulted to ensure that ARVs are being properly dosed.    Past Medical History  Diagnosis Date  . HIV infection   . Hyperlipidemia   . Hypertension   . Insomnia   . Dysphagia   . Achalasia   . Renal abscess     Stage II kidney disease  . Hemorrhoids, internal   . Arthritis     Past Surgical History  Procedure Laterality Date  . Nephrectomy      Right  ergies:   Allergies  Allergen Reactions  . Amoxicillin     REACTION: diffuse rash     Medications: I have reviewed patients current medications as documented in Epic Anti-infectives    Start     Dose/Rate Route Frequency Ordered Stop   02/11/14 2200  dolutegravir (TIVICAY) tablet 50 mg  Status:  Discontinued     50 mg Oral 2 times daily 02/11/14 1517 02/11/14 1517   02/11/14 1530  dolutegravir (TIVICAY) tablet 50 mg     50 mg Oral 2 times daily 02/11/14 1517     02/10/14 1330  doxycycline (VIBRAMYCIN) 100 mg in dextrose 5 % 250 mL IVPB  Status:  Discontinued     100 mg125 mL/hr over 120 Minutes Intravenous Every 12 hours 02/10/14 0304 02/10/14 0721   02/10/14 1200  ciprofloxacin (CIPRO) tablet 250 mg  Status:  Discontinued     250 mg Oral 2 times daily 02/10/14 1141 02/11/14 1517   02/10/14 1000  Abacavir-Dolutegravir-Lamivud 600-50-300 MG TABS 1 tablet  Status:  Discontinued     1 tablet Oral Daily  02/10/14 0833 02/10/14 0836   02/10/14 1000  dolutegravir (TIVICAY) tablet 50 mg  Status:  Discontinued     50 mg Oral Daily 02/10/14 0840 02/11/14 1517   02/10/14 1000  lamiVUDine (EPIVIR) 10 MG/ML solution 50 mg     50 mg Oral Daily 02/10/14 0840     02/10/14 1000  abacavir (ZIAGEN) tablet 600 mg     600 mg Oral Daily 02/10/14 0840     02/10/14 0100  doxycycline (VIBRAMYCIN) 100 mg in dextrose 5 % 250 mL IVPB     100 mg125 mL/hr over 120 Minutes Intravenous  Once 02/10/14 0041 02/10/14 0334      Social History:  reports that he has never smoked. He has never used smokeless tobacco. He reports that he does not drink alcohol or use illicit drugs.  Family History  Problem Relation Age of Onset  . Colon cancer Father     As in HPI and primary teams notes otherwise 12 point review of systems is negative  Blood pressure 149/84, pulse 90, temperature 99.2 F (37.3 C), temperature source Oral, resp. rate 18, height 6' 6"  (1.981 m), weight 143 lb 9.6 oz (65.137 kg), SpO2 98 %.  General: Alert and awake, oriented lying sidewise in bed. HEENT: anicteric sclera, pupils reactive to light and accommodation, EOMI, oropharynx clear and without exudate CVS regular rate, normal r,  no murmur rubs or gallops Chest: clear to auscultation bilaterally, no wheezing, rales or rhonchi Abdomen: soft nontender, nondistended, normal bowel sounds, Skin: no rashes Neuro: nonfocal, strength and sensation intact   Results for orders placed or performed during the hospital encounter of 02/09/14 (from the past 48 hour(s))  CBC with Differential     Status: Abnormal   Collection Time: 02/09/14 10:58 PM  Result Value Ref Range   WBC 10.9 (H) 4.0 - 10.5 K/uL   RBC 3.20 (L) 4.22 - 5.81 MIL/uL   Hemoglobin 9.7 (L) 13.0 - 17.0 g/dL   HCT 28.4 (L) 39.0 - 52.0 %   MCV 88.8 78.0 - 100.0 fL   MCH 30.3 26.0 - 34.0 pg   MCHC 34.2 30.0 - 36.0 g/dL   RDW 13.8 11.5 - 15.5 %   Platelets 207 150 - 400 K/uL   Neutrophils  Relative % 76 43 - 77 %   Neutro Abs 8.3 (H) 1.7 - 7.7 K/uL   Lymphocytes Relative 13 12 - 46 %   Lymphs Abs 1.5 0.7 - 4.0 K/uL   Monocytes Relative 10 3 - 12 %   Monocytes Absolute 1.1 (H) 0.1 - 1.0 K/uL   Eosinophils Relative 1 0 - 5 %   Eosinophils Absolute 0.1 0.0 - 0.7 K/uL   Basophils Relative 0 0 - 1 %   Basophils Absolute 0.0 0.0 - 0.1 K/uL  Comprehensive metabolic panel     Status: Abnormal   Collection Time: 02/09/14 10:58 PM  Result Value Ref Range   Sodium 136 (L) 137 - 147 mEq/L   Potassium 6.0 (H) 3.7 - 5.3 mEq/L   Chloride 105 96 - 112 mEq/L   CO2 16 (L) 19 - 32 mEq/L   Glucose, Bld 114 (H) 70 - 99 mg/dL   BUN 87 (H) 6 - 23 mg/dL   Creatinine, Ser 6.28 (H) 0.50 - 1.35 mg/dL   Calcium 8.4 8.4 - 10.5 mg/dL   Total Protein 8.8 (H) 6.0 - 8.3 g/dL   Albumin 2.0 (L) 3.5 - 5.2 g/dL   AST 19 0 - 37 U/L   ALT 8 0 - 53 U/L   Alkaline Phosphatase 59 39 - 117 U/L   Total Bilirubin 0.2 (L) 0.3 - 1.2 mg/dL   GFR calc non Af Amer 9 (L) >90 mL/min   GFR calc Af Amer 10 (L) >90 mL/min    Comment: (NOTE) The eGFR has been calculated using the CKD EPI equation. This calculation has not been validated in all clinical situations. eGFR's persistently <90 mL/min signify possible Chronic Kidney Disease.    Anion gap 15 5 - 15  B. burgdorfi antibodies     Status: None   Collection Time: 02/09/14 10:58 PM  Result Value Ref Range   B burgdorferi Ab IgG+IgM 0.55 ISR    Comment: (NOTE) Antibody to Borrelia burgdorferi not detected.   ISR = Immune Status Ratio             <0.90         ISR       Negative             0.90 - 1.09   ISR       Equivocal             >=1.10  ISR       Positive Performed at [facility]   B. burgdorfi antibodies by [employer]     Status: None   Collection Time: 02/09/14 10:58 PM  Result Value Ref Range   B burgdorferi IgG Abs (IB) Negative     Comment: (NOTE) ** Reference Range for each analyte: Negative or Non Reactive ** IgG Positive: Any five  of the following ten bands: 18, 23, 28, 30, 39, 41, 45, 58, 66, 93 kDa. IgG Negative: Any pattern that does not meet the IgG positive criteria.    B burgdorferi IgM Abs (IB) Negative     Comment: (NOTE) ** Reference Range for each analyte: Negative or Non Reactive ** IgM Positive: Any two of the following three bands: 23, 39, 41 kDa. IgM Negative: Any pattern that does not meet the IgM positive criteria. A positive IgM test result alone is not recommended for use in determining active disease due to a high likelihood of a false-positive result in persons with illness greater than 1 month duration. The CDC recommends testing by the EIA antibody test, followed by the Immunoblot confirmatory test only when the EIA test is positive. Performed at [place] Lactic Acid, ED     Status: None   Collection Time: 02/09/14 11:15 PM  Result Value Ref Range   Lactic Acid, Venous 0.67 0.5 - 2.2 mmol/L  Urine culture     Status: None   Collection Time: 02/09/14 11:17 PM  Result Value Ref Range   Specimen Description URINE, RANDOM    Special Requests NONE    Culture  Setup Time      02/10/2014 05:59 Performed at [place] Performed at [facility]     Culture NO GROWTH Performed at [facility]     Report Status 02/11/2014 FINAL   Urinalysis, Routine w reflex microscopic     Status: Abnormal   Collection Time: 02/09/14 11:17 PM  Result Value Ref Range   Color, Urine YELLOW YELLOW   APPearance CLOUDY (A) CLEAR   Specific Gravity, Urine 1.017 1.005 - 1.030   pH 5.0 5.0 - 8.0   Glucose, UA NEGATIVE NEGATIVE mg/dL   Hgb urine dipstick LARGE (A) NEGATIVE   Bilirubin Urine NEGATIVE NEGATIVE   Ketones, ur NEGATIVE NEGATIVE mg/dL   Protein, ur 100 (A) NEGATIVE mg/dL   Urobilinogen, UA 1.0 0.0 - 1.0 mg/dL   Nitrite NEGATIVE NEGATIVE   Leukocytes, UA TRACE (A) NEGATIVE  Urine microscopic-add on     Status:  Abnormal   Collection Time: 02/09/14 11:17 PM  Result Value Ref Range   Squamous Epithelial / LPF RARE RARE   WBC, UA 7-10 <3 WBC/hpf   RBC / HPF 11-20 <3 RBC/hpf   Bacteria, UA MANY (A) RARE   Urine-Other MUCOUS PRESENT   CK     Status: None   Collection Time: 02/09/14 11:47 PM  Result Value Ref Range   Total CK 198 7 - 232 U/L  Culture, blood (routine x 2)     Status: None (Preliminary result)   Collection Time: 02/10/14  4:45 AM  Result Value Ref Range   Specimen Description BLOOD LEFT ARM    Special Requests BOTTLES DRAWN AEROBIC AND ANAEROBIC 8CC EACH    Culture  Setup Time      02/10/2014 10:52 Performed at [facility]  BLOOD CULTURE RECEIVED NO GROWTH TO DATE CULTURE WILL BE HELD FOR 5 DAYS BEFORE ISSUING A FINAL NEGATIVE REPORT Performed at [facility]    Report Status PENDING   Renal function panel     Status: Abnormal   Collection Time: 02/10/14  4:45 AM  Result Value Ref Range   Sodium 137 137 - 147 mEq/L   Potassium 5.2 3.7 - 5.3 mEq/L   Chloride 109 96 - 112 mEq/L   CO2 15 (L) 19 - 32 mEq/L   Glucose, Bld 117 (H) 70 - 99 mg/dL   BUN 81 (H) 6 - 23 mg/dL   Creatinine, Ser 5.67 (H) 0.50 - 1.35 mg/dL   Calcium 7.6 (L) 8.4 - 10.5 mg/dL   Phosphorus 5.3 (H) 2.3 - 4.6 mg/dL   Albumin 1.6 (L) 3.5 - 5.2 g/dL   GFR calc non Af Amer 10 (L) >90 mL/min   GFR calc Af Amer 12 (L) >90 mL/min    Comment: (NOTE) The eGFR has been calculated using the CKD EPI equation. This calculation has not been validated in all clinical situations. eGFR's persistently <90 mL/min signify possible Chronic Kidney Disease.    Anion gap 13 5 - 15  CBC     Status: Abnormal   Collection Time: 02/10/14  4:45 AM  Result Value Ref Range   WBC 8.3 4.0 - 10.5 K/uL   RBC 2.69 (L) 4.22 - 5.81 MIL/uL   Hemoglobin 8.1 (L) 13.0 - 17.0 g/dL   HCT 23.9 (L) 39.0 - 52.0 %   MCV 88.8 78.0 - 100.0 fL   MCH 30.1 26.0 - 34.0 pg   MCHC 33.9 30.0 - 36.0 g/dL   RDW  14.1 11.5 - 15.5 %   Platelets 181 150 - 400 K/uL  Culture, blood (routine x 2)     Status: None (Preliminary result)   Collection Time: 02/10/14  4:53 AM  Result Value Ref Range   Specimen Description BLOOD RIGHT HAND    Special Requests BOTTLES DRAWN AEROBIC AND ANAEROBIC 5CC EACH    Culture  Setup Time      02/10/2014 10:52 Performed at [place] NO GROWTH TO DATE CULTURE WILL BE HELD FOR 5 DAYS BEFORE ISSUING A FINAL NEGATIVE REPORT Performed at [facility]    Report Status PENDING   Protein, urine, 24 hour     Status: Abnormal   Collection Time: 02/10/14 10:30 AM  Result Value Ref Range   Urine Total Volume-UPROT 1800 mL   Collection Interval-UPROT 24 hours   Protein, Urine 156 mg/dL   Protein, 24H Urine 2808 (H) 50 - 100 mg/day  Creatinine clearance, urine, 24 hour     Status: Abnormal   Collection Time: 02/10/14 10:30 AM  Result Value Ref Range   Urine Total Volume-CRCL 1800 mL   Collection Interval-CRCL 24 hours   Creatinine, Urine 82.64 mg/dL   Creatinine 5.29 (H) 0.50 - 1.35 mg/dL   Creatinine, 24H Ur 1488 800 - 2000 mg/day   Creatinine Clearance 20 (L) 75 - 125 mL/min  Antistreptolysin O titer     Status: None   Collection Time: 02/10/14 12:06 PM  Result Value Ref Range   ASO 114 <409 IU/mL    Comment: Performed at [facility]  Anti-DNA antibody, double-stranded     Status: Abnormal   Collection Time: 02/10/14 12:06 PM  Result Value  Ref Range   ds DNA Ab 6210 (H) IU/mL    Comment: (NOTE) Result confirmed by automatic dilution.                              IU/mL       Interpretation                              < or = 4    Negative                              5-9         Indeterminate                              > or = 10   Positive Performed at [facility]   ANA     Status: Abnormal   Collection Time: 02/10/14 12:06 PM  Result Value Ref Range   ANA Ser Ql POSITIVE (A)  NEGATIVE    Comment: Performed at [facility]  C3 complement     Status: Abnormal   Collection Time: 02/10/14 12:06 PM  Result Value Ref Range   C3 Complement 31 (L) 90 - 180 mg/dL    Comment: Performed at [place] complement     Status: Abnormal   Collection Time: 02/10/14 12:06 PM  Result Value Ref Range   Complement C4, Body Fluid 4 (L) 10 - 40 mg/dL    Comment: Performed at [facility]  Rheumatoid factor     Status: None   Collection Time: 02/10/14 12:06 PM  Result Value Ref Range   Rhuematoid fact SerPl-aCnc 10 <=14 IU/mL    Comment: (NOTE)                         Interpretive Table                    Low Positive: 15 - 41 IU/mL                    High Positive:  >= 42 IU/mL In addition to the RF result, and clinical symptoms including joint involvement, the 2010 ACR Classification Criteria for scoring/diagnosing Rheumatoid Arthritis include the results of the following tests:  CRP (88110), ESR (15010), and CCP (APCA) (31594). www.rheumatology.org/practice/clinical/classification/ra/ra_2010.asp Performed at [place] ab-titer (ANA titer)     Status: Abnormal   Collection Time: 02/10/14 12:06 PM  Result Value Ref Range   ANA Titer 1 1:1280 (H) <1:40    Comment: (NOTE) Reference Ranges: 1:40 - 1:80 Weakly positive, usually not clinically significant. > or = to 1:160 Result may be clinically significant.                                                                          ANA Pattern 1 SPECKLED     Comment: Performed at [facility]  Ferritin     Status:  Abnormal   Collection Time: 02/10/14  3:55 PM  Result Value Ref Range   Ferritin 484 (H) 22 - 322 ng/mL    Comment: Performed at [facility]  Iron and TIBC     Status: Abnormal   Collection Time: 02/10/14  3:55 PM  Result Value Ref Range   Iron 20 (L) 42 - 135 ug/dL   TIBC 135 (L) 215 - 435 ug/dL   Saturation Ratios 15 (L) 20 - 55 %    UIBC 115 (L) 125 - 400 ug/dL    Comment: Performed at [place] metabolic panel     Status: Abnormal   Collection Time: 02/11/14  5:40 AM  Result Value Ref Range   Sodium 138 137 - 147 mEq/L   Potassium 5.8 (H) 3.7 - 5.3 mEq/L   Chloride 112 96 - 112 mEq/L   CO2 13 (L) 19 - 32 mEq/L   Glucose, Bld 91 70 - 99 mg/dL   BUN 74 (H) 6 - 23 mg/dL   Creatinine, Ser 5.29 (H) 0.50 - 1.35 mg/dL   Calcium 7.8 (L) 8.4 - 10.5 mg/dL   GFR calc non Af Amer 11 (L) >90 mL/min   GFR calc Af Amer 13 (L) >90 mL/min    Comment: (NOTE) The eGFR has been calculated using the CKD EPI equation. This calculation has not been validated in all clinical situations. eGFR's persistently <90 mL/min signify possible Chronic Kidney Disease.    Anion gap 13 5 - 15  CBC     Status: Abnormal   Collection Time: 02/11/14  5:40 AM  Result Value Ref Range   WBC 9.4 4.0 - 10.5 K/uL   RBC 2.67 (L) 4.22 - 5.81 MIL/uL   Hemoglobin 8.0 (L) 13.0 - 17.0 g/dL   HCT 23.6 (L) 39.0 - 52.0 %   MCV 88.4 78.0 - 100.0 fL   MCH 30.0 26.0 - 34.0 pg   MCHC 33.9 30.0 - 36.0 g/dL   RDW 14.1 11.5 - 15.5 %   Platelets 177 150 - 400 K/uL  Synovial cell count + diff, w/ crystals     Status: Abnormal   Collection Time: 02/11/14  2:30 PM  Result Value Ref Range   Color, Synovial STRAW (A) YELLOW   Appearance-Synovial CLOUDY (A) CLEAR   Crystals, Fluid NO CRYSTALS SEEN    WBC, Synovial 1025 (H) 0 - 200 /cu mm   Neutrophil, Synovial 80 (H) 0 - 25 %   Lymphocytes-Synovial Fld 10 0 - 20 %   Monocyte-Macrophage-Synovial Fluid 10 (L) 50 - 90 %   Eosinophils-Synovial 0 0 - 1 %   Other Cells-SYN FEW LINING CELLS PRESENT    @BRIEFLABTABLE (sdes,specrequest,cult,reptstatus)   ) Recent Results (from the past 720 hour(s))  Urine culture     Status: None   Collection Time: 02/09/14 11:17 PM  Result Value Ref Range Status   Specimen Description URINE, RANDOM  Final   Special Requests NONE  Final   Culture  Setup Time    Final    02/10/2014 05:59 Performed at [place] Performed at [facility]   Final   Culture NO GROWTH Performed at [facility]   Final   Report Status 02/11/2014 FINAL  Final  Culture, blood (routine x 2)     Status: None (Preliminary result)   Collection Time: 02/10/14  4:45 AM  Result Value Ref Range Status   Specimen Description BLOOD  LEFT ARM  Final   Special Requests BOTTLES DRAWN AEROBIC AND ANAEROBIC [facility] EACH  Final   Culture  Setup Time   Final    02/10/2014 10:52 Performed at [facility]    Culture   Final           BLOOD CULTURE RECEIVED NO GROWTH TO DATE CULTURE WILL BE HELD FOR 5 DAYS BEFORE ISSUING A FINAL NEGATIVE REPORT Performed at [facility]    Report Status PENDING  Incomplete  Culture, blood (routine x 2)     Status: None (Preliminary result)   Collection Time: 02/10/14  4:53 AM  Result Value Ref Range Status   Specimen Description BLOOD RIGHT HAND  Final   Special Requests BOTTLES DRAWN AEROBIC AND ANAEROBIC 5CC EACH  Final   Culture  Setup Time   Final    02/10/2014 10:52 Performed at [facility]    Culture   Final           BLOOD CULTURE RECEIVED NO GROWTH TO DATE CULTURE WILL BE HELD FOR 5 DAYS BEFORE ISSUING A FINAL NEGATIVE REPORT Performed at [facility]    Report Status PENDING  Incomplete     Impression/Recommendation  Principal Problem:   AKI (acute kidney injury) Active Problems:   HIV disease   Hyperlipidemia   Anemia   Essential hypertension   Arthralgia of multiple sites, bilateral   Acute renal failure syndrome   Fever   Hematuria   Protein-calorie malnutrition, severe   Kenneth [name] is a 58 y.o. male with  Perfectly controlled HIV, healthy CD4 count admitted with ARF likely due to SLE  #1 HIV meds are being renally dosed, though I wanted to increase his TIvicay to 33m bid given worsened renal function  #2 SLE and renal  failure in patient with one kidney. Currently on steroids  DOES HE NEED MORE POTENT IMMUNO=SUPPRESION IF THIS IS INDEED SLE KIDNEY DISEASE??  I would support whatever level of immunosuppression felt necessary to get his kidneys to recover as much as possible. We can keep his HIV suppressed. His ARVs are being renally dosed and the meds he was on were already a "kidney friendly" regimen. The TIvicay only causing artefactual cr rise by inhibiting tubular secretion of Cr. He had antecedent CKD, HTN and only one kidney but his AKI appears to be autoimmune.       02/11/2014, 5:54 PM   Thank you so much for this interesting consult  RChepachetfor [phone](pager) [phone](office) 02/11/2014, 5:54 PM  CM[place] Mills11/02/2014, 5:54 PM

## 2014-02-11 NOTE — Progress Notes (Signed)
Occupational Therapy Treatment Patient Details Name: DEMILADE DUCASSE MRN: [id number] DOB: 16-Mar-1956 Today's Date: 02/11/2014    History of present illness 58 y.o. male presenting with AKI on CKD II, polyarthralgias, SIRS without clear source. 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   OT comments  Pt appears weaker today as compared to yesterday. Pt prefers to keep B knees flexed and is at risk for losing Knee extension ROM, Discussed importance of terminal extension with pt as tolerated throughout the day.REquired Mod A for transfers this pm due to LOB. Pt agreeable to SNF for rehab. Will continue to follow to address goals and facilitate D/C to next venue of care.   Follow Up Recommendations  SNF    Equipment Recommendations  None recommended by OT    Recommendations for Other Services      Precautions / Restrictions Precautions Precautions: Fall Precaution Comments: B knee pain       Mobility Bed Mobility    S              Transfers Overall transfer level: Needs assistance Equipment used: 1 person hand held assist Transfers: Sit to/from Stand;Stand Pivot Transfers Sit to Stand: Min assist;From elevated surface Stand pivot transfers: Mod assist            Balance Overall balance assessment: Needs assistance           Standing balance-Leahy Scale: Poor                     ADL                                  Min Assistance for balance for hygiene after toileting     Functional mobility during ADLs: Moderate assistance  Discussed importance of terminal knee extension      Vision                     Perception     Praxis      Cognition   Behavior During Therapy: WFL for tasks assessed/performed Overall Cognitive Status: Within Functional Limits for tasks assessed                       Extremity/Trunk Assessment                Exercises Other Exercises Other Exercises: BUE theraband strengthening exercises - level 3   Shoulder Instructions       General Comments      Pertinent Vitals/ Pain       Pain Assessment: 0-10 Pain Score: 6  Pain Location: B knees Pain Descriptors / Indicators: Aching Pain Intervention(s): Limited activity within patient's tolerance;Repositioned  Home Living                                          Prior Functioning/Environment              Frequency Min 2X/week     Progress Toward Goals  OT Goals(current goals can now be found in the care plan section)  Progress towards OT goals: Progressing toward goals  Acute Rehab OT Goals Patient Stated Goal: to get better OT Goal Formulation: With patient Time For Goal Achievement: 02/24/14 Potential  to Achieve Goals: Good ADL Goals Pt Will Perform Lower Body Bathing: with modified independence;sit to/from stand;with adaptive equipment Pt Will Perform Lower Body Dressing: with modified independence;with adaptive equipment;sit to/from stand Pt Will Transfer to Toilet: with modified independence;ambulating;bedside commode Pt Will Perform Toileting - Clothing Manipulation and hygiene: with modified independence;sit to/from stand Pt Will Perform Tub/Shower Transfer: Shower transfer;with modified independence;ambulating;shower seat;rolling walker  Plan Discharge plan needs to be updated    Co-evaluation                 End of Session Equipment Utilized During Treatment: Gait belt   Activity Tolerance Patient tolerated treatment well   Patient Left in chair;with call bell/phone within reach   Nurse Communication Mobility status       Maurie Boettcher, OTR/L  [id number] 02/11/2014 Time: [id number] OT Time Calculation (min): 16 min  Charges: OT General Charges $OT Visit: 1 Procedure OT Treatments $Therapeutic Activity: 8-22 mins  Deen Deguia,HILLARY 02/11/2014, 5:21 PM

## 2014-02-11 NOTE — Plan of Care (Signed)
Problem: Phase I Progression Outcomes Goal: Pain controlled with appropriate interventions Outcome: Completed/Met Date Met:  02/11/14 Goal: OOB as tolerated unless otherwise ordered Outcome: Completed/Met Date Met:  02/11/14 Goal: Adequate I & O Outcome: Completed/Met Date Met:  02/11/14

## 2014-02-11 NOTE — Clinical Documentation Improvement (Signed)
Possible Clinical Conditions?  Severe Malnutrition   Protein Calorie Malnutrition Severe Protein Calorie Malnutrition Other Condition Cannot clinically determine  Supporting Information:(As per INITIAL NUTRITION ASSESSMENT on 02-10-14)  Pt meets criteria for SEVERE MALNUTRITION in the context of chronic illness as evidenced by a 7.6% weight loss in one month, energy intake </= 75% for >/= 1 month, and severe muscle mass depletion.  Thank You, Alessandra Grout, RN, BSN, CCDS,Clinical Documentation Specialist:  [phone]  [phone]=[facility] [place]- Health Information Management

## 2014-02-11 NOTE — Progress Notes (Signed)
Patient ID: Kenneth [name], male    DOB: 25-Mar-1956, 58 y.o.   MRN: [id number] Nephrology Progress Note  S: Patient states his pain is stable. No other complaints. Has been eating and drinking.  O:BP 141/83 mmHg  Pulse 99  Temp(Src) 99.5 F (37.5 C) (Oral)  Resp 18  Ht _0  (1.981 m)  Wt 143 lb 9.6 oz (65.137 kg)  BMI 16.60 kg/m2  SpO2 98%  Intake/Output Summary (Last 24 hours) at 02/11/14 0752 Last data filed at 02/11/14 0601  Gross per 24 hour  Intake    980 ml  Output   1515 ml  Net   -535 ml   Intake/Output: I/O last 3 completed shifts: In: 1443 [P.O.:700; I.V.:500; IV Piggyback:250] Out: 1790 [Urine:1790]  Intake/Output this shift:    Weight change: -2 lb (-0.907 kg) XVQ:MGQQ male, laying in bed in no distress PYP:PJKDTOI rate and rhythm, no murmur Resp:Clear to auscultation bilaterally ZTI:WPYK, non-tender, non-distended Ext:LE with no edema   Recent Labs Lab 02/09/14 2258 02/10/14 0445 02/11/14 0540  NA 136* 137 138  K 6.0* 5.2 5.8*  CL 105 109 112  CO2 16* 15* 13*  GLUCOSE 114* 117* 91  BUN 87* 81* 74*  CREATININE 6.28* 5.67* 5.29*  ALBUMIN 2.0* 1.6*  --   CALCIUM 8.4 7.6* 7.8*  PHOS  --  5.3*  --   AST 19  --   --   ALT 8  --   --    Liver Function Tests:  Recent Labs Lab 02/09/14 2258 02/10/14 0445  AST 19  --   ALT 8  --   ALKPHOS 59  --   BILITOT 0.2*  --   PROT 8.8*  --   ALBUMIN 2.0* 1.6*   No results for input(s): LIPASE, AMYLASE in the last 168 hours. No results for input(s): AMMONIA in the last 168 hours. CBC:  Recent Labs Lab 02/09/14 2258 02/10/14 0445 02/11/14 0540  WBC 10.9* 8.3 9.4  NEUTROABS 8.3*  --   --   HGB 9.7* 8.1* 8.0*  HCT 28.4* 23.9* 23.6*  MCV 88.8 88.8 88.4  PLT 207 181 177   Cardiac Enzymes:  Recent Labs Lab 02/09/14 2347  CKTOTAL 198   CBG: No results for input(s): GLUCAP in the last 168 hours.  Iron Studies:  Recent Labs  02/10/14 1555  IRON 20*  TIBC 135*  FERRITIN 484*    Ref  Range 1d ago    C3 Complement 90 - 180 mg/dL 31 (L)     Ref Range 1d ago    Complement C4, Body Fluid 10 - 40 mg/dL 4 (L)      Studies/Results: Dg Chest 2 View  02/09/2014   CLINICAL DATA:  Multifocal joint pain for 4-5 weeks.  EXAM: CHEST  2 VIEW  COMPARISON:  PA and lateral chest 06/09/2013.  FINDINGS: The lungs are clear. Heart size is upper normal. No pneumothorax or pleural effusion. No focal bony abnormality. Surgical clips left upper quadrant of the abdomen are noted.  IMPRESSION: No acute disease.   Electronically Signed   By: Inge Rise M.D.   On: 02/09/2014 23:50   US Renal  02/10/2014   CLINICAL DATA:  Acute renal injury, status post right nephrectomy  EXAM: RENAL/URINARY TRACT ULTRASOUND COMPLETE  COMPARISON:  01/02/2009  FINDINGS: Right Kidney:  Surgically removed  Left Kidney:  Length: 15.7 cm. Echogenicity within normal limits. No mass or hydronephrosis visualized.  Bladder:  Appears normal for degree of bladder  distention.  Incidental note is made of cholelithiasis.  IMPRESSION: Status post right nephrectomy.  Cholelithiasis  No acute abnormality is noted.   Electronically Signed   By: [name] M.D.   On: 02/10/2014 09:54   . abacavir  600 mg Oral Daily  . amLODipine  10 mg Oral Daily  . atorvastatin  40 mg Oral q1800  . ciprofloxacin  250 mg Oral BID  . dolutegravir  50 mg Oral Daily  . feeding supplement (NEPRO CARB STEADY)  237 mL Oral BID BM  . heparin subcutaneous  5,000 Units Subcutaneous 3 times per day  . lamiVUDine  50 mg Oral Daily  . pantoprazole  20 mg Oral Daily  . polyethylene glycol  17 g Oral Daily  . sodium chloride  3 mL Intravenous Q12H    Assessment/Plan: LADARRYL WRAGE 58 y.o. 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  1. AKI: Baseline creatinine of 1.1-1.2. Creatinine on admission of 6.28 that is down to 5.29 today (11/11). Patient is  currently receiving NS IVF at maintenance rate. Renal ultrasound (11/10) significant for s/p right nephrectomy. Suspect recent history of decreased PO intake in addition to patient having one kidney probably contributed to AKI. Do not suspect HAART therapy caused this acute injury.  1. Continue IVF- think tank is full , will stop IVF 2. Follow renal function- will also follow up on serologies as hematuria not completely expected.  With low complements and elvated sed rate, indicative of something extra going on.  Will check an ANCA as well.  The definative test would be a kidney biopsy which I am hesitant to do with patient only having one functional kidney and with renal function improving.  Given positive ANA possibly getting rheum involved or attempting a trial of steroids to see if joints and hematuria improve ? Not sure yet  2. Polyarthralgia: management per primary. Appears to be working up for rheumatologic cause- does not appear to have synovitis- rheum factor negative  3. Normocytic anemia: baseline around 11-13. Down to 8.0 today. Last iron panel in 2010 suggests anemia of chronic disease. No history of melena or bright red blood per rectum. Iron panel today suggest anemia of chronic disease as well as being iron deficient 1. Will give iron first, then assess for ESA 4. Hematuria: microscopic. Concern this could be intrinsic kidney injury however patient also with evidence of possible UTI. C3, C4 are low. RF negative. ASO, ANA positive, anti-dna  And ANCA Pending. 5. UTI: on cipro per primary  6. Hyperkalemia: lingering- low K diet  7. HIV: on abacavir, dolutegravir and lamivudine. Recommend continuing therapy. Primary team and infectious disease managing 8. Hypertension: on amlodipine. Continue per primary  Cordelia Poche, MD PGY-2, [facility] 02/11/2014, 10:09 AM   Patient seen and examined, agree with above note with above modifications. Patient still acting a little  strangely.  Renal function improving but only very slowly.  I think tank is full so will stop IVF.  Low comps and elevated ESR also with positive ANA and hematuria  Indicate possibly something intrarenal.  The issue is how to make a definative diagnosis in someone with a solitary kidney.  Right now our hand is not forced as he seems to be improving.  I would rec maybe getting rheum involved and we may need to do a trial of steroids just to see if anything gets better ?  Corliss Parish, MD 02/11/2014

## 2014-02-11 NOTE — Progress Notes (Signed)
Family Medicine Teaching Service Daily Progress Note Intern Pager: [phone]  Patient name: Kenneth [name] Medical record number: [id number] Date of birth: [birth year] Age: 58 y.o. Gender: male  Primary Care Provider: Cordelia Poche, MD Consultants: Nephrology Code Status: Full  Pt Overview and Major Events to Date:  02/10/14: Admitted to Family Medicine Teaching Service  Assessment and Plan: Kenneth [name] is a 58 y.o. male presenting with AKI on CKD II, polyarthralgias, SIRS without clear source. 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  # AKI on CKD II: Cr 6.28 on admission (baseline ~1.2, recently 1.37 in 8/15). S/p R nephrectomy in 2011 for congenital hydronephrosis and non-functioning kidney.  - Creatinine 5/29 this morning - UA with 100 protein and 7-10 RBCs, concerning for nephritis of some type or nephrotic syndrome but no edema on exam.  - Nephrology consulted--appreciate recommendations - ASO negative - Rheumatoid factor negative - C3 of 31, C4 of 4--both are low  - Causes of decreased C3/C4 include :systemic lupus erythematous (SLE), vasculitis, hepatitis C-associated cryoglobulinemia, hepatitis B, glomerulonephritis (GN), pneumococcal infection, and gram-negative sepsis---consider these in differential - ANA positive - IVF: NS @ 173m/hr - Renal [place] no acute abnormality, cholithiasis - daily weights and strict Is&Os  - Intake: 1480; Output: 1515 - Follow up parathyroid hormone, Anti-DNA antibody, blood smear, 24hr urine, anti-histone antibodies  # Polyarthralgias: ~5wk history of joint pain primarily in knees/ankles/hands. No appreciable joint swelling or redness on exam. No rashes noted. ESR and CRP elevated to 135 and 8.3 respectively on 02/02/14. Differential includes lyme disease, parvovirus, HIV (previosuly very low viral loads, compliant with HARRT), lupus, HCV (HCV Ab recently negative),  seronegative spondyloarthropathies.  - Follow up Lyme titer--negative - Morphine 262mq2h prn for pain control - Consider outpatient Rheumatology referral - Will aspirate fluid on knee today and obtain synovial cell count with differential and crystals. Follow up results.   # Hyperkalemia: K 6.0 on admission. Mildly peaked T waves on EKG. - Potassium 5.8 this morning - Miralax 17g daily - Monitor on telemetry  # HIV disease: Recent CD4 count 3 -90 and viral load <20 (11/11/13) - HAART restarted on 02/10/14  # Normocytic Anemia: Hgb 9.7 on admission (baseline 11-12.5). Likely related to renal disease. - Hemoglobin 8 this morning - Iron Panel:  Iron 20, UIBC 115, TIBC 135, Ferritin 484  - Indicative of Anemia of Chronic Disease - Continue to monitor   # HTN: Currently mildly elevated, likely from missing medications today while in the ED - 141/83 this morning - Continue home amlodipine 1066m Continue to monitor closely in setting of AKI and SIRS  FEN/GI: Renal diet, NS @ 125m55m Prophylaxis: subq heparin per pharm (for renal dosing)  [facility]: [facility] OT recommends CIR.  Subjective:  No acute complaints overnight. States his pain is much improved since yesterday. No bowel movements since admission to hospital. No further complaints today.  Objective: Temp:  [97.9 F (36.6 C)-99.6 F (37.6 C)] 99.5 F (37.5 C) (11/11 0600) Pulse Rate:  [96-105] 99 (11/11 0600) Resp:  [16-18] 18 (11/11 0600) BP: (124-164)/(70-83) 141/83 mmHg (11/11 0600) SpO2:  [98 %-100 %] 98 % (11/11 0600) Weight:  [143 lb 9.6 oz (65.137 kg)] 143 lb 9.6 oz (65.137 kg) (11/10 2033) Physical Exam: General: 58yo48yoe resting comfortably in no apparent distress Cardiovascular: S1 and S2 noted. No murmurs/rubs/gallops. Respiratory: Clear to auscultation bilaterally. No wheezing noted. No  increased work of breathing. Abdomen: Bowel sounds noted. Nontender to  palpation. Extremities: No edema in ankles. Pulses palpable. Effusion noted on knees bilateral R>L  Laboratory:  Recent Labs Lab 02/09/14 2258 02/10/14 0445 02/11/14 0540  WBC 10.9* 8.3 9.4  HGB 9.7* 8.1* 8.0*  HCT 28.4* 23.9* 23.6*  PLT 207 181 177    Recent Labs Lab 02/09/14 2258 02/10/14 0445 02/11/14 0540  NA 136* 137 138  K 6.0* 5.2 5.8*  CL 105 109 112  CO2 16* 15* 13*  BUN 87* 81* 74*  CREATININE 6.28* 5.67* 5.29*  CALCIUM 8.4 7.6* 7.8*  PROT 8.8*  --   --   BILITOT 0.2*  --   --   ALKPHOS 59  --   --   ALT 8  --   --   AST 19  --   --   GLUCOSE 114* 117* 91  -CK 198 Iron/TIBC/Ferritin/ %Sat    Component Value Date/Time   IRON 20* 02/10/2014 1555   TIBC 135* 02/10/2014 1555   FERRITIN 484* 02/10/2014 1555   IRONPCTSAT 15* 02/10/2014 1555  - CRP 8.3 - Lactic Acid 0.67 - ESR 135 - ASO 114 - Rheumatoid Factor 10 - C3 31 - C4 4 Urinalysis    Component Value Date/Time   COLORURINE YELLOW 02/09/2014 2317   APPEARANCEUR CLOUDY* 02/09/2014 2317   LABSPEC 1.017 02/09/2014 2317   PHURINE 5.0 02/09/2014 2317   GLUCOSEU NEGATIVE 02/09/2014 2317   GLUCOSEU NEG mg/dL 08/16/2006 2308   HGBUR LARGE* 02/09/2014 2317   BILIRUBINUR NEGATIVE 02/09/2014 2317   KETONESUR NEGATIVE 02/09/2014 2317   PROTEINUR 100* 02/09/2014 2317   UROBILINOGEN 1.0 02/09/2014 2317   NITRITE NEGATIVE 02/09/2014 2317   LEUKOCYTESUR TRACE* 02/09/2014 2317  - Lyme titer negative  Imaging/Diagnostic Tests: Dg Chest 2 View  02/09/2014   CLINICAL DATA:  Multifocal joint pain for 4-5 weeks.  EXAM: CHEST  2 VIEW  COMPARISON:  PA and lateral chest 06/09/2013.  FINDINGS: The lungs are clear. Heart size is upper normal. No pneumothorax or pleural effusion. No focal bony abnormality. Surgical clips left upper quadrant of the abdomen are noted.  IMPRESSION: No acute disease.   Electronically Signed   By: Inge Rise M.D.   On: 02/09/2014 23:50   US Renal  02/10/2014   CLINICAL DATA:   Acute renal injury, status post right nephrectomy  EXAM: RENAL/URINARY TRACT ULTRASOUND COMPLETE  COMPARISON:  01/02/2009  FINDINGS: Right Kidney:  Surgically removed  Left Kidney:  Length: 15.7 cm. Echogenicity within normal limits. No mass or hydronephrosis visualized.  Bladder:  Appears normal for degree of bladder distention.  Incidental note is made of cholelithiasis.  IMPRESSION: Status post right nephrectomy.  Cholelithiasis  No acute abnormality is noted.   Electronically Signed   By: [name] M.D.   On: 02/10/2014 09:54   Lorna Few, DO 02/11/2014, 8:36 AM PGY-1, [facility] Intern pager: [phone], text pages welcome

## 2014-02-11 NOTE — Progress Notes (Signed)
Called niece, Nathinel Heckman. Discussed disposition for patient. She is thinking SNF will most likely be appropriate with patient's current state. She will coordinate with family regarding care for patient. States that patient lives alone and that she is the closest relative to patient (she lives in [place]). She will not be in town until December, however.

## 2014-02-11 NOTE — Progress Notes (Signed)
Contacted the [facility]ology [facility] at [facility] concerning possible steroid use for Kenneth [name]. Recommended 40mg  prednisone daily while in hospital and discharging on 20mg  prednisone daily with close follow up at their clinic. Noted that HIV and HAART medications can also cause arthralgias, so if no improvement is noted on steroids these etiologies should be considered. Greatly appreciate their recommendations.  Dr. Gerlean Ren PGY1

## 2014-02-11 NOTE — Progress Notes (Signed)
Determined knee aspiration would be beneficial to Kenneth [name] for both diagnostic and therapeutic purposes. Verbal consent was obtained. Kenneth [name] decided the right knee was the most painful for him, so aspiration was conducted on the right knee. Knee was sterilized with alcohol swabs and anesthetic was used. Knee was then sterilized using betadine and an 18 gauge needle was used in an superolateral approach to obtain 52cc of cloudy yellow fluid. Small amount of blood was present in the fluid due to repositioning of the needle. Needle was removed and pressure was applied before placing a bandage on the insertion site. No complications noted. Fluid was sent for cell count with differential, microscopy for crystal analysis, Gram stain, and culture.  Will evaluate effectiveness of aspiration tomorrow and consider aspiration of left knee at that time.   Dr. Gerlean Ren PGY1

## 2014-02-12 DIAGNOSIS — M3214 Glomerular disease in systemic lupus erythematosus: Secondary | ICD-10-CM

## 2014-02-12 DIAGNOSIS — E43 Unspecified severe protein-calorie malnutrition: Secondary | ICD-10-CM

## 2014-02-12 LAB — ANCA SCREEN W REFLEX TITER
Atypical p-ANCA Screen: POSITIVE — AB
c-ANCA Screen: NEGATIVE
p-ANCA Screen: NEGATIVE

## 2014-02-12 LAB — CBC
HEMATOCRIT: 22.9 % — AB (ref 39.0–52.0)
Hemoglobin: 7.7 g/dL — ABNORMAL LOW (ref 13.0–17.0)
MCH: 30.3 pg (ref 26.0–34.0)
MCHC: 33.6 g/dL (ref 30.0–36.0)
MCV: 90.2 fL (ref 78.0–100.0)
Platelets: 176 10*3/uL (ref 150–400)
RBC: 2.54 MIL/uL — ABNORMAL LOW (ref 4.22–5.81)
RDW: 14.3 % (ref 11.5–15.5)
WBC: 10 10*3/uL (ref 4.0–10.5)

## 2014-02-12 LAB — ANCA TITERS: Atypical P-ANCA titer: 1:640 {titer} — ABNORMAL HIGH

## 2014-02-12 LAB — RENAL FUNCTION PANEL
ALBUMIN: 1.5 g/dL — AB (ref 3.5–5.2)
ANION GAP: 13 (ref 5–15)
BUN: 77 mg/dL — AB (ref 6–23)
CHLORIDE: 111 meq/L (ref 96–112)
CO2: 14 mEq/L — ABNORMAL LOW (ref 19–32)
Calcium: 8 mg/dL — ABNORMAL LOW (ref 8.4–10.5)
Creatinine, Ser: 5.36 mg/dL — ABNORMAL HIGH (ref 0.50–1.35)
GFR calc Af Amer: 13 mL/min — ABNORMAL LOW (ref >90)
GFR calc non Af Amer: 11 mL/min — ABNORMAL LOW (ref >90)
Glucose, Bld: 100 mg/dL — ABNORMAL HIGH (ref 70–99)
POTASSIUM: 5.5 meq/L — AB (ref 3.7–5.3)
Phosphorus: 5.3 mg/dL — ABNORMAL HIGH (ref 2.3–4.6)
SODIUM: 138 meq/L (ref 137–147)

## 2014-02-12 LAB — HISTONE ANTIBODIES, IGG, BLOOD: DNA-Histone: >8

## 2014-02-12 LAB — PARATHYROID HORMONE, INTACT (NO CA): PTH: 124 pg/mL — ABNORMAL HIGH (ref 14–64)

## 2014-02-12 MED ORDER — PREDNISONE 50 MG PO TABS
60.0000 mg | ORAL_TABLET | Freq: Every day | ORAL | Status: DC
  Administered 2014-02-13 – 2014-02-18 (×6): 60 mg via ORAL
  Filled 2014-02-12 (×7): qty 1

## 2014-02-12 MED ORDER — PREDNISONE 20 MG PO TABS
20.0000 mg | ORAL_TABLET | Freq: Once | ORAL | Status: AC
  Administered 2014-02-12: 20 mg via ORAL
  Filled 2014-02-12: qty 1

## 2014-02-12 MED ORDER — HEPARIN SODIUM (PORCINE) 5000 UNIT/ML IJ SOLN
5000.0000 [IU] | Freq: Three times a day (TID) | INTRAMUSCULAR | Status: DC
  Administered 2014-02-13 – 2014-02-18 (×15): 5000 [IU] via SUBCUTANEOUS
  Filled 2014-02-12 (×18): qty 1

## 2014-02-12 NOTE — Plan of Care (Signed)
Problem: Phase I Progression Outcomes Goal: Initial discharge plan identified Outcome: Completed/Met Date Met:  02/12/14 Goal: Tolerating diet Outcome: Completed/Met Date Met:  02/12/14  Problem: Phase II Progression Outcomes Goal: Progress activity as tolerated unless otherwise ordered Outcome: Completed/Met Date Met:  02/12/14 Goal: Tolerating diet Outcome: Completed/Met Date Met:  02/12/14

## 2014-02-12 NOTE — Consult Note (Signed)
Chief Complaint: Chief Complaint  Patient presents with  . Joint Pain  hematuria Lupus nephritis  Referring Physician(s): Dr Moshe Cipro  History of Present Illness: Kenneth [name] is a 58 y.o. male  +HIV pt; Lupus nephritis New onset body/joint aches Seen in ED- admitted 11/9 with elevated Creatinine Noted hematuria Sen by Nephrology Dr Moshe Cipro Request made for IR consult for US guided random renal biopsy I have seen and examined pt Now scheduled procedure for 11/13 Hx Rt nephrectomy 2011-- congenital hydronephrosis Hold Hep injections 11/12  Past Medical History  Diagnosis Date  . HIV infection   . Hyperlipidemia   . Hypertension   . Insomnia   . Dysphagia   . Achalasia   . Renal abscess     Stage II kidney disease  . Hemorrhoids, internal   . Arthritis     Past Surgical History  Procedure Laterality Date  . Nephrectomy      Right    Allergies: Amoxicillin  Medications: Prior to Admission medications   Medication Sig Start Date End Date Taking? Authorizing Provider  Abacavir-Dolutegravir-Lamivud [id number] MG TABS Take 1 tablet by mouth daily. 07/24/13  Yes Campbell Riches, MD  acetaminophen (TYLENOL) 500 MG tablet Take 500 mg by mouth every 6 (six) hours as needed for mild pain.   Yes Historical Provider, MD  amLODipine (NORVASC) 10 MG tablet Take 1 tablet (10 mg total) by mouth daily. 01/20/14  Yes Cordelia Poche, MD  atorvastatin (LIPITOR) 40 MG tablet TAKE 1/2 TABLET BY MOUTH DAILY Patient taking differently: Take 1/2 tablet by mouth daily 02/14/13  Yes Cordelia Poche, MD  cyclobenzaprine (FLEXERIL) 5 MG tablet Take 5 mg by mouth 3 (three) times daily as needed for muscle spasms.   Yes Historical Provider, MD  traMADol (ULTRAM) 50 MG tablet Take 1 tablet (50 mg total) by mouth every 8 (eight) hours as needed. Patient taking differently: Take 50 mg by mouth every 8 (eight) hours as needed for moderate pain.  02/02/14  Yes Bryan R Hess, DO    pantoprazole (PROTONIX) 20 MG tablet Take 1 tablet (20 mg total) by mouth daily. 06/09/13   Dorie Rank, MD    Family History  Problem Relation Age of Onset  . Colon cancer Father     History   Social History  . Marital Status: Single    Spouse Name: N/A    Number of Children: N/A  . Years of Education: N/A   Social History Main Topics  . Smoking status: Never Smoker   . Smokeless tobacco: Never Used  . Alcohol Use: No  . Drug Use: No  . Sexual Activity: No     Comment: pt. declined condoms   Other Topics Concern  . None   Social History Narrative    Review of Systems: A 12 point ROS discussed and pertinent positives are indicated in the HPI above.  All other systems are negative.  Review of Systems  Constitutional: Positive for activity change and appetite change. Negative for fever.  Respiratory: Negative for cough and shortness of breath.   Cardiovascular: Negative for chest pain.  Gastrointestinal: Negative for nausea and abdominal pain.  Genitourinary: Positive for hematuria.  Musculoskeletal: Positive for neck pain.       Overall Joint pain  Allergic/Immunologic: Positive for immunocompromised state.  Neurological: Positive for weakness.  Psychiatric/Behavioral: Negative for behavioral problems and confusion.    Vital Signs: BP 144/85 mmHg  Pulse 88  Temp(Src) 98.6 F (37 C) (Oral)  Resp  18  Ht 6\' 6"  (1.981 m)  Wt 72.167 kg (159 lb 1.6 oz)  BMI 18.39 kg/m2  SpO2 100%  Physical Exam  Constitutional: He is oriented to person, place, and time.  Cardiovascular: Normal rate and regular rhythm.   No murmur heard. Pulmonary/Chest: Effort normal and breath sounds normal.  Abdominal: Soft. Bowel sounds are normal. There is no tenderness.  Musculoskeletal: Normal range of motion. He exhibits tenderness.  Neurological: He is alert and oriented to person, place, and time.  Skin: Skin is warm and dry.  Psychiatric: He has a normal mood and affect. His behavior  is normal. Judgment and thought content normal.    Imaging: Dg Chest 2 View  02/09/2014   CLINICAL DATA:  Multifocal joint pain for 4-5 weeks.  EXAM: CHEST  2 VIEW  COMPARISON:  PA and lateral chest 06/09/2013.  FINDINGS: The lungs are clear. Heart size is upper normal. No pneumothorax or pleural effusion. No focal bony abnormality. Surgical clips left upper quadrant of the abdomen are noted.  IMPRESSION: No acute disease.   Electronically Signed   By: Inge Rise M.D.   On: 02/09/2014 23:50   US Renal  02/10/2014   CLINICAL DATA:  Acute renal injury, status post right nephrectomy  EXAM: RENAL/URINARY TRACT ULTRASOUND COMPLETE  COMPARISON:  01/02/2009  FINDINGS: Right Kidney:  Surgically removed  Left Kidney:  Length: 15.7 cm. Echogenicity within normal limits. No mass or hydronephrosis visualized.  Bladder:  Appears normal for degree of bladder distention.  Incidental note is made of cholelithiasis.  IMPRESSION: Status post right nephrectomy.  Cholelithiasis  No acute abnormality is noted.   Electronically Signed   By: [name] M.D.   On: 02/10/2014 09:54    Labs:  CBC:  Recent Labs  02/09/14 2258 02/10/14 0445 02/11/14 0540 02/12/14 0514  WBC 10.9* 8.3 9.4 10.0  HGB 9.7* 8.1* 8.0* 7.7*  HCT 28.4* 23.9* 23.6* 22.9*  PLT 207 181 177 176    COAGS: No results for input(s): INR, APTT in the last 8760 hours.  BMP:  Recent Labs  02/09/14 2258 02/10/14 0445 02/10/14 1030 02/11/14 0540 02/12/14 0514  NA 136* 137  --  138 138  K 6.0* 5.2  --  5.8* 5.5*  CL 105 109  --  112 111  CO2 16* 15*  --  13* 14*  GLUCOSE 114* 117*  --  91 100*  BUN 87* 81*  --  74* 77*  CALCIUM 8.4 7.6*  --  7.8* 8.0*  CREATININE 6.28* 5.67* 5.29* 5.29* 5.36*  GFRNONAA 9* 10*  --  11* 11*  GFRAA 10* 12*  --  13* 13*    LIVER FUNCTION TESTS:  Recent Labs  07/10/13 1529 11/11/13 1108 02/09/14 2258 02/10/14 0445 02/12/14 0514  BILITOT 0.3 0.3 0.2*  --   --   AST 22 18 19   --   --     ALT 18 16 8   --   --   ALKPHOS 91 89 59  --   --   PROT 7.7 8.1 8.8*  --   --   ALBUMIN 3.7 3.8 2.0* 1.6* 1.5*    TUMOR MARKERS: No results for input(s): AFPTM, CEA, CA199, CHROMGRNA in the last 8760 hours.  Assessment and Plan:  Lupus nephritis Elevated Creatinine Hematuria Random renal biopsy scheduled for 11/13 in [place] Pt aware of procedure benefits and risks and agreeable to proceed Consent signed andin chart  Thank you for this interesting consult.  I greatly enjoyed meeting KHRYSTOPHER MCALPINE and look forward to participating in their care.    I spent a total of 20 minutes face to face in clinical consultation, greater than 50% of which was counseling/coordinating care for random renal bx  Signed: Alvey Brockel A 02/12/2014, 1:08 PM

## 2014-02-12 NOTE — Progress Notes (Signed)
Physical Therapy Treatment Patient Details Name: Kenneth [name] MRN: [id number] DOB: [birth year] Today's Date: 02/12/2014    History of Present Illness 58 y.o. male presenting with AKI on CKD II, polyarthralgias, SIRS without clear source. 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 Pt had fluid drawn from R knee 02/11/14 with improvement in symptoms. To have fluid taken from L knee this afternoon (02/12/14) per RN.    PT Comments    Pt showing good progress towards physical therapy goals. He was able to ambulate ~40 feet this session with RW for support. Distance ambulated was limited per RN request, as MD to drain fluid from L knee this afternoon. Pt states his knee pain is greatly improved, however still appears to be limited in strength and AROM. Continue to recommend CIR at d/c - feel pt is a great candidate for this rehab program.    Follow Up Recommendations  CIR;Supervision/Assistance - 24 hour     Equipment Recommendations  Rolling walker with 5" wheels;3in1 (PT)    Recommendations for Other Services Rehab consult     Precautions / Restrictions Precautions Precautions: Fall Precaution Comments: B knee pain Restrictions Weight Bearing Restrictions: No    Mobility  Bed Mobility Overal bed mobility: Needs Assistance Bed Mobility: Supine to Sit;Sit to Supine     Supine to sit: Supervision Sit to supine: Supervision   General bed mobility comments: No physical assist required. Supervision for safety.   Transfers Overall transfer level: Needs assistance Equipment used: Rolling walker (2 wheeled) Transfers: Sit to/from Stand Sit to Stand: Min guard         General transfer comment: No physical assist required. Pt was able to power-up to full standing with increased time and guarding for safety.   Ambulation/Gait Ambulation/Gait assistance: Min guard Ambulation Distance (Feet): 40  Feet Assistive device: Rolling walker (2 wheeled) Gait Pattern/deviations: Decreased stride length;Step-to pattern;Step-through pattern;Trunk flexed Gait velocity: Decreased Gait velocity interpretation: Below normal speed for age/gender General Gait Details: Pt was cued for increased step/stride length on RLE for step-through gait pattern. Pt states he has no pain during ambulation.    Stairs            Wheelchair Mobility    Modified Rankin (Stroke Patients Only)       Balance Overall balance assessment: Needs assistance Sitting-balance support: Feet supported;No upper extremity supported Sitting balance-Leahy Scale: Good     Standing balance support: Bilateral upper extremity supported Standing balance-Leahy Scale: Fair Standing balance comment: Feel pt can stand statically without UE support.                     Cognition Arousal/Alertness: Awake/alert Behavior During Therapy: WFL for tasks assessed/performed Overall Cognitive Status: Within Functional Limits for tasks assessed                      Exercises General Exercises - Lower Extremity Long Arc Quad: 10 reps Hip ABduction/ADduction: 10 reps Straight Leg Raises: 10 reps    General Comments        Pertinent Vitals/Pain Pain Assessment: No/denies pain Pain Score: 0-No pain Pain Intervention(s): Premedicated before session    Home Living                      Prior Function            PT Goals (current goals can now be found in  the care plan section) Acute Rehab PT Goals Patient Stated Goal: to get better PT Goal Formulation: With patient Time For Goal Achievement: 02/24/14 Potential to Achieve Goals: Good Progress towards PT goals: Progressing toward goals    Frequency  Min 3X/week    PT Plan Current plan remains appropriate    Co-evaluation             End of Session Equipment Utilized During Treatment: Gait belt Activity Tolerance: Patient tolerated  treatment well Patient left: in bed;with call bell/phone within reach     Time: [id number] PT Time Calculation (min) (ACUTE ONLY): 30 min  Charges:  $Gait Training: 8-22 mins $Therapeutic Exercise: 8-22 mins                    G Codes:      Rolinda Roan Mar 09, 2014, 4:03 PM   Rolinda Roan, PT, DPT Acute Rehabilitation Services Pager: [phone]

## 2014-02-12 NOTE — Progress Notes (Signed)
[name] decided he would like to have his left knee aspirated as well since the right knee aspiration on 02/11/14 was effective and helped to relieve pain. Written consent was obtained. Knee was sterilized with alcohol swabs and anesthetic was used. Knee was then sterilized using betadine and an 18 gauge needle was used in an superolateral approach to obtain 43cc of cloudy yellow fluid. Needle was removed and pressure was applied before placing a bandage on the insertion site. No complications noted.  Will monitor for signs of infection and complications.  Dr. Gerlean Ren PGY1

## 2014-02-12 NOTE — Progress Notes (Signed)
Family Medicine Teaching Service Daily Progress Note Intern Pager: [phone]  Patient name: Kenneth [name] Medical record number: [id number] Date of birth: July 30, 1955 Age: 58 y.o. Gender: male  Primary Care Provider: Cordelia Poche, MD Consultants: Nephrology Code Status: Full  Pt Overview and Major Events to Date:  02/10/14: Admitted to Family Medicine Teaching Service  Assessment and Plan: Kenneth [name] is a 58 y.o. male presenting with AKI on CKD II, polyarthralgias, SIRS without clear source. 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  # AKI on CKD II: Cr 6.28 on admission (baseline ~1.2, recently 1.37 in 8/15). S/p R nephrectomy in 2011 for congenital hydronephrosis and non-functioning kidney. 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 - Febrile at 101.2 this morning. Suspected to be associated with possible lupus diagnosis. - Creatinine 5.36 this morning, up from 5.29 at night - UA with 100 protein and 7-10 RBCs, concerning for nephritis of some type or nephrotic syndrome but no edema on exam.  - Nephrology consulted--appreciate recommendations  - Renal Biopsy--follow up results - ASO negative - Rheumatoid factor negative - C3 of 31, C4 of 4--both are low  - Causes of decreased C3/C4 include :systemic lupus erythematous (SLE), vasculitis, hepatitis C-associated cryoglobulinemia, hepatitis B, glomerulonephritis (GN), pneumococcal infection, and gram-negative sepsis---consider these in differential - ANA positive, speckled - dsDNA ab- 6210 - IVF: NS @ 168m/hr - Renal [place] no acute abnormality, cholithiasis - daily weights and strict Is&Os  - Intake: 1480; Output: 1515 - Follow up parathyroid hormone, blood smear, 24hr urine, anti-histone antibodies, ANCA  # Polyarthralgias: ~5wk history of joint pain primarily in knees/ankles/hands. No appreciable joint swelling or redness on  exam. No rashes noted. ESR and CRP elevated to 135 and 8.3 respectively on 02/02/14. Differential includes lyme disease, parvovirus, HIV (previosuly very low viral loads, compliant with HARRT), lupus, HCV (HCV Ab recently negative), seronegative spondyloarthropathies.  - Follow up Lyme titer--negative - Morphine 278mq2h prn for pain control - Consider outpatient Rheumatology referral - Fluid aspirated off of right knee yesterday. Would like left knee aspirated today. - Follow up synovial fluid cell count with differential, crystal analysis, culture, and Gram stain  # Hyperkalemia: K 6.0 on admission. Mildly peaked T waves on EKG. - Potassium 5.5 this morning - Miralax 17g daily - Monitor on telemetry  # HIV disease: Recent CD4 count 390 and viral load <20 (11/11/13) - HAART restarted on 02/10/14 - Follow up viral load and CD4 count - ID consulted: [place] increased to BID  # Normocytic Anemia: Hgb 9.7 on admission (baseline 11-12.5). Likely related to renal disease. - Hemoglobin 7.7 this morning - Iron Panel:  Iron 20, UIBC 115, TIBC 135, Ferritin 484  - Indicative of Anemia of Chronic Disease - Continue to monitor   # HTN: Currently mildly elevated, likely from missing medications today while in the ED - 144/85 this morning - Continue home amlodipine 1032m Continue to monitor closely in setting of AKI and SIRS  FEN/GI: Renal diet, NS @ 125m27m Prophylaxis: subq heparin per pharm (for renal dosing)  [facility]: [facility]  CIR evaluation.  Subjective:  No acute complaints overnight. States his pain is much improved, however he has not been ambulating much so he is unable to fully assess improvement of pain.  Would like to have fluid aspirated off of his left knee today. Has had one bowel movement since admission. No further complaints  today.  Objective: Temp:  [98.8 F (37.1 C)-101.2 F (38.4 C)] 101.2 F (38.4 C) (11/12 0500) Pulse  Rate:  [94-99] 99 (11/12 0500) Resp:  [18] 18 (11/12 0500) BP: (135-150)/(78-91) 145/91 mmHg (11/12 0500) SpO2:  [94 %-98 %] 98 % (11/12 0500) Weight:  [159 lb 1.6 oz (72.167 kg)] 159 lb 1.6 oz (72.167 kg) (11/11 2124) Physical Exam: General: 58yo male resting comfortably in no apparent distress Cardiovascular: S1 and S2 noted. No murmurs/rubs/gallops. Respiratory: Clear to auscultation bilaterally. No wheezing noted. No increased work of breathing. Abdomen: Bowel sounds noted. Nontender to palpation. Extremities: No edema in ankles. Pulses palpable. Effusion of left knee. Right knee much improved since aspiration yesterday.  Laboratory:  Recent Labs Lab 02/10/14 0445 02/11/14 0540 02/12/14 0514  WBC 8.3 9.4 10.0  HGB 8.1* 8.0* 7.7*  HCT 23.9* 23.6* 22.9*  PLT 181 177 176    Recent Labs Lab 02/09/14 2258 02/10/14 0445 02/10/14 1030 02/11/14 0540 02/12/14 0514  NA 136* 137  --  138 138  K 6.0* 5.2  --  5.8* 5.5*  CL 105 109  --  112 111  CO2 16* 15*  --  13* 14*  BUN 87* 81*  --  74* 77*  CREATININE 6.28* 5.67* 5.29* 5.29* 5.36*  CALCIUM 8.4 7.6*  --  7.8* 8.0*  PROT 8.8*  --   --   --   --   BILITOT 0.2*  --   --   --   --   ALKPHOS 59  --   --   --   --   ALT 8  --   --   --   --   AST 19  --   --   --   --   GLUCOSE 114* 117*  --  91 100*  -CK 198 Iron/TIBC/Ferritin/ %Sat    Component Value Date/Time   IRON 20* 02/10/2014 1555   TIBC 135* 02/10/2014 1555   FERRITIN 484* 02/10/2014 1555   IRONPCTSAT 15* 02/10/2014 1555  - CRP 8.3 - Lactic Acid 0.67 - ESR 135 - ASO 114 - Rheumatoid Factor 10 - C3 31 - C4 4 - ANA positive, speckled - dsDNA ab- 6210  Urinalysis    Component Value Date/Time   COLORURINE YELLOW 02/09/2014 2317   APPEARANCEUR CLOUDY* 02/09/2014 2317   LABSPEC 1.017 02/09/2014 2317   PHURINE 5.0 02/09/2014 2317   GLUCOSEU NEGATIVE 02/09/2014 2317   GLUCOSEU NEG mg/dL 08/16/2006 2308   HGBUR LARGE* 02/09/2014 2317   BILIRUBINUR  NEGATIVE 02/09/2014 2317   KETONESUR NEGATIVE 02/09/2014 2317   PROTEINUR 100* 02/09/2014 2317   UROBILINOGEN 1.0 02/09/2014 2317   NITRITE NEGATIVE 02/09/2014 2317   LEUKOCYTESUR TRACE* 02/09/2014 2317  - Lyme titer negative  Imaging/Diagnostic Tests: Dg Chest 2 View  02/09/2014   CLINICAL DATA:  Multifocal joint pain for 4-5 weeks.  EXAM: CHEST  2 VIEW  COMPARISON:  PA and lateral chest 06/09/2013.  FINDINGS: The lungs are clear. Heart size is upper normal. No pneumothorax or pleural effusion. No focal bony abnormality. Surgical clips left upper quadrant of the abdomen are noted.  IMPRESSION: No acute disease.   Electronically Signed   By: Inge Rise M.D.   On: 02/09/2014 23:50   US Renal  02/10/2014   CLINICAL DATA:  Acute renal injury, status post right nephrectomy  EXAM: RENAL/URINARY TRACT ULTRASOUND COMPLETE  COMPARISON:  01/02/2009  FINDINGS: Right Kidney:  Surgically removed  Left Kidney:  Length: 15.7 cm. Echogenicity  within normal limits. No mass or hydronephrosis visualized.  Bladder:  Appears normal for degree of bladder distention.  Incidental note is made of cholelithiasis.  IMPRESSION: Status post right nephrectomy.  Cholelithiasis  No acute abnormality is noted.   Electronically Signed   By: [name] M.D.   On: 02/10/2014 09:54   Lorna Few, DO 02/12/2014, 9:51 AM PGY-1, [place] Intern pager: [phone], text pages welcome

## 2014-02-12 NOTE — Progress Notes (Signed)
Patient ID: Kenneth [name], male    DOB: 27-Oct-1955, 58 y.o.   MRN: [id number] Nephrology Progress Note  S: Patient states his pain is stable. No other complaints. Fever last night, however he states he felt fine. No chest pain or shortness of breath. Has been eating and drinking. Rheumatology was called and they recommended steroids  O:BP 145/91 mmHg  Pulse 99  Temp(Src) 101.2 F (38.4 C) (Oral)  Resp 18  Ht 6\' 6"  (1.981 m)  Wt 159 lb 1.6 oz (72.167 kg)  BMI 18.39 kg/m2  SpO2 98%  Intake/Output Summary (Last 24 hours) at 02/12/14 0733 Last data filed at 02/11/14 2138  Gross per 24 hour  Intake    243 ml  Output    550 ml  Net   -307 ml   Intake/Output: I/O last 3 completed shifts: In: 1243 [P.O.:240; I.V.:1003] Out: 1325 [Urine:1325]  Intake/Output this shift:    Weight change: 15 lb 8 oz (7.031 kg) [id number] male, laying in bed in no distress [id number] rate and rhythm, no murmur Resp:Clear to auscultation bilaterally [id number], non-tender, non-distended Ext:LE with trace edema in ankles   Recent Labs Lab 02/09/14 2258 02/10/14 0445 02/10/14 1030 02/11/14 0540 02/12/14 0514  NA 136* 137  --  138 138  K 6.0* 5.2  --  5.8* 5.5*  CL 105 109  --  112 111  CO2 16* 15*  --  13* 14*  GLUCOSE 114* 117*  --  91 100*  BUN 87* 81*  --  74* 77*  CREATININE 6.28* 5.67* 5.29* 5.29* 5.36*  ALBUMIN 2.0* 1.6*  --   --  1.5*  CALCIUM 8.4 7.6*  --  7.8* 8.0*  PHOS  --  5.3*  --   --  5.3*  AST 19  --   --   --   --   ALT 8  --   --   --   --    Liver Function Tests:  Recent Labs Lab 02/09/14 2258 02/10/14 0445 02/12/14 0514  AST 19  --   --   ALT 8  --   --   ALKPHOS 59  --   --   BILITOT 0.2*  --   --   PROT 8.8*  --   --   ALBUMIN 2.0* 1.6* 1.5*   No results for input(s): LIPASE, AMYLASE in the last 168 hours. No results for input(s): AMMONIA in the last 168 hours. CBC:  Recent Labs Lab 02/09/14 2258 02/10/14 0445 02/11/14 0540 02/12/14 0514  WBC 10.9*  8.3 9.4 10.0  NEUTROABS 8.3*  --   --   --   HGB 9.7* 8.1* 8.0* 7.7*  HCT 28.4* 23.9* 23.6* 22.9*  MCV 88.8 88.8 88.4 90.2  PLT 207 181 177 176   Cardiac Enzymes:  Recent Labs Lab 02/09/14 2347  CKTOTAL 198   CBG: No results for input(s): GLUCAP in the last 168 hours.  Iron Studies:   Recent Labs  02/10/14 1555  IRON 20*  TIBC 135*  FERRITIN 484*    Ref Range 1d ago    C3 Complement 90 - 180 mg/dL 31 (L)     Ref Range 1d ago    Complement C4, Body Fluid 10 - 40 mg/dL 4 (L)      Studies/Results: US Renal  02/10/2014   CLINICAL DATA:  Acute renal injury, status post right nephrectomy  EXAM: RENAL/URINARY TRACT ULTRASOUND COMPLETE  COMPARISON:  01/02/2009  FINDINGS: Right Kidney:  Surgically removed  Left Kidney:  Length: 15.7 cm. Echogenicity within normal limits. No mass or hydronephrosis visualized.  Bladder:  Appears normal for degree of bladder distention.  Incidental note is made of cholelithiasis.  IMPRESSION: Status post right nephrectomy.  Cholelithiasis  No acute abnormality is noted.   Electronically Signed   By: [name] M.D.   On: 02/10/2014 09:54   . abacavir  600 mg Oral Daily  . amLODipine  10 mg Oral Daily  . atorvastatin  40 mg Oral q1800  . dolutegravir  50 mg Oral BID  . feeding supplement (NEPRO CARB STEADY)  237 mL Oral BID BM  . ferric gluconate (FERRLECIT/NULECIT) IV  125 mg Intravenous Daily  . heparin subcutaneous  5,000 Units Subcutaneous 3 times per day  . lamiVUDine  50 mg Oral Daily  . pantoprazole  20 mg Oral Daily  . polyethylene glycol  17 g Oral BID  . predniSONE  40 mg Oral Q breakfast  . sodium chloride  3 mL Intravenous Q12H    Assessment/Plan: Kenneth [name] 58 y.o. 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  1. AKI: Baseline creatinine of 1.1-1.2. Creatinine on admission of 6.28. Has come down however is currently 5.36, up from  5.29. IVF off. Patient not recorded taking good PO, however unsure if accurate. Renal ultrasound (11/10) significant for s/p right nephrectomy. 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 UOP of 550 in last 24 hours. With low complements and elvated sed rate, indicative of something extra going on.  Will check an ANCA as well.  The definative test would be a kidney biopsy which I was hesitant to do with patient only having one functional kidney and with renal function improving, but now that renal funciton not improving, feel it will be best course of action as this is something that is potentially reversible.  I agree with steroids, will bump it to 60 mg daily 1. Follow renal function 2. Will consult IR for kidney biopsy  2. Polyarthralgia: management per primary. Appears to be working up for rheumatologic cause- - rheum factor negative 1. Increased to Prednisone 60mg , see if it helps 3. Normocytic anemia: baseline around 11-13. Down to 7.7 today. Last iron panel in 2010 suggests anemia of chronic disease. No history of melena or bright red blood per rectum. Iron panel today suggest anemia of chronic disease as well as being iron deficient. Appears to not have received iron yesterday 1. give iron, then assess for ESA 4. Hematuria: microscopic. Concern this could be intrinsic kidney injury however patient also with evidence of possible UTI. C3, C4 are low. RF negative. ASO, ANA and anti-dna positive. Kidney biopsy will give definitive diagnosis and allow [place] to give him a more focused treatment plan if needed 1. ANCA Pending 5. UTI: no growth on culture. abx stopped 6. Hyperkalemia: lingering- low K diet  7. HIV: on abacavir, dolutegravir and lamivudine. Recommend continuing therapy. Primary team and infectious disease managing 8. Hypertension: on amlodipine. Continue per primary  Cordelia Poche, MD PGY-2, [place] [facility] 02/12/2014, 7:33 AM   Patient seen and examined,  agree with above note with above modifications. Renal function no longer improving.  Hematuria, low complements and now positive ANA definitely makes me concerned for intrinsic renal disease.  The only was to get a definitive dx would be to do a biopsy- i was a little hesitant with solitary kidney but think  it is the best course of action as we may find something potentially reversible.  Also start on a good dose of prednisone and follow clinically  Corliss Parish, MD 02/12/2014

## 2014-02-12 NOTE — Progress Notes (Signed)
[facility]    Subjective: No new complaints   Antibiotics:  Anti-infectives    Start     Dose/Rate Route Frequency Ordered Stop   02/11/14 2200  dolutegravir (TIVICAY) tablet 50 mg  Status:  Discontinued     50 mg Oral 2 times daily 02/11/14 1517 02/11/14 1517   02/11/14 1530  dolutegravir (TIVICAY) tablet 50 mg     50 mg Oral 2 times daily 02/11/14 1517     02/10/14 1330  doxycycline (VIBRAMYCIN) 100 mg in dextrose 5 % 250 mL IVPB  Status:  Discontinued     100 mg125 mL/hr over 120 Minutes Intravenous Every 12 hours 02/10/14 0304 02/10/14 0721   02/10/14 1200  ciprofloxacin (CIPRO) tablet 250 mg  Status:  Discontinued     250 mg Oral 2 times daily 02/10/14 1141 02/11/14 1517   02/10/14 1000  Abacavir-Dolutegravir-Lamivud 600-50-300 MG TABS 1 tablet  Status:  Discontinued     1 tablet Oral Daily 02/10/14 0833 02/10/14 0836   02/10/14 1000  dolutegravir (TIVICAY) tablet 50 mg  Status:  Discontinued     50 mg Oral Daily 02/10/14 0840 02/11/14 1517   02/10/14 1000  lamiVUDine (EPIVIR) 10 MG/ML solution 50 mg     50 mg Oral Daily 02/10/14 0840     02/10/14 1000  abacavir (ZIAGEN) tablet 600 mg     600 mg Oral Daily 02/10/14 0840     02/10/14 0100  doxycycline (VIBRAMYCIN) 100 mg in dextrose 5 % 250 mL IVPB     100 mg125 mL/hr over 120 Minutes Intravenous  Once 02/10/14 0041 02/10/14 0334      Medications: Scheduled Meds: . abacavir  600 mg Oral Daily  . amLODipine  10 mg Oral Daily  . atorvastatin  40 mg Oral q1800  . dolutegravir  50 mg Oral BID  . feeding supplement (NEPRO CARB STEADY)  237 mL Oral BID BM  . ferric gluconate (FERRLECIT/NULECIT) IV  125 mg Intravenous Daily  . heparin subcutaneous  5,000 Units Subcutaneous 3 times per day  . lamiVUDine  50 mg Oral Daily  . pantoprazole  20 mg Oral Daily  . polyethylene glycol  17 g Oral BID  . [START ON 02/13/2014] predniSONE  60 mg Oral Q breakfast  . sodium chloride  3 mL Intravenous Q12H    Continuous Infusions:  PRN Meds:.acetaminophen **OR** acetaminophen, morphine injection    Objective: Weight change: 15 lb 8 oz (7.031 kg)  Intake/Output Summary (Last 24 hours) at 02/12/14 1113 Last data filed at 02/12/14 0900  Gross per 24 hour  Intake    363 ml  Output    800 ml  Net   -437 ml   Blood pressure 144/85, pulse 88, temperature 98.6 F (37 C), temperature source Oral, resp. rate 18, height 6\' 6"  (1.981 m), weight 159 lb 1.6 oz (72.167 kg), SpO2 100 %. Temp:  [98.6 F (37 C)-101.2 F (38.4 C)] 98.6 F (37 C) (11/12 1000) Pulse Rate:  [88-99] 88 (11/12 1000) Resp:  [18] 18 (11/12 1000) BP: (135-150)/(78-91) 144/85 mmHg (11/12 1000) SpO2:  [94 %-100 %] 100 % (11/12 1000) Weight:  [159 lb 1.6 oz (72.167 kg)] 159 lb 1.6 oz (72.167 kg) (11/11 2124)  Physical Exam: General: Alert and awake, oriented x3, not in any acute distress. HEENT: anicteric sclera, pupils reactive to light and accommodation, EOMI CVS regular rate, normal r,  no murmur rubs or gallops Chest: clear to auscultation bilaterally, no  wheezing, rales or rhonchi Abdomen: soft nontender, nondistended, normal bowel sounds, Neuro: nonfocal  CBC: CBC Latest Ref Rng 02/12/2014 02/11/2014 02/10/2014  WBC 4.0 - 10.5 K/uL 10.0 9.4 8.3  Hemoglobin 13.0 - 17.0 g/dL 7.7(L) 8.0(L) 8.1(L)  Hematocrit 39.0 - 52.0 % 22.9(L) 23.6(L) 23.9(L)  Platelets 150 - 400 K/uL 176 177 181      BMET  Recent Labs  02/11/14 0540 02/12/14 0514  NA 138 138  K 5.8* 5.5*  CL 112 111  CO2 13* 14*  GLUCOSE 91 100*  BUN 74* 77*  CREATININE 5.29* 5.36*  CALCIUM 7.8* 8.0*     Liver Panel   Recent Labs  02/09/14 2258 02/10/14 0445 02/12/14 0514  PROT 8.8*  --   --   ALBUMIN 2.0* 1.6* 1.5*  AST 19  --   --   ALT 8  --   --   ALKPHOS 59  --   --   BILITOT 0.2*  --   --        Sedimentation Rate No results for input(s): ESRSEDRATE in the last 72 hours. C-Reactive Protein No results for input(s): CRP  in the last 72 hours.  Micro Results: Recent Results (from the past 720 hour(s))  Urine culture     Status: None   Collection Time: 02/09/14 11:17 PM  Result Value Ref Range Status   Specimen Description URINE, RANDOM  Final   Special Requests NONE  Final   Culture  Setup Time   Final    02/10/2014 05:59 Performed at [place] NO GROWTH Performed at [facility]   Final   Culture NO GROWTH Performed at [facility]   Final   Report Status 02/11/2014 FINAL  Final  Culture, blood (routine x 2)     Status: None (Preliminary result)   Collection Time: 02/10/14  4:45 AM  Result Value Ref Range Status   Specimen Description BLOOD LEFT ARM  Final   Special Requests BOTTLES DRAWN AEROBIC AND ANAEROBIC [facility] EACH  Final   Culture  Setup Time   Final    02/10/2014 10:52 Performed at [facility]    Culture   Final           BLOOD CULTURE RECEIVED NO GROWTH TO DATE CULTURE WILL BE HELD FOR 5 DAYS BEFORE ISSUING A FINAL NEGATIVE REPORT Performed at [facility]    Report Status PENDING  Incomplete  Culture, blood (routine x 2)     Status: None (Preliminary result)   Collection Time: 02/10/14  4:53 AM  Result Value Ref Range Status   Specimen Description BLOOD RIGHT HAND  Final   Special Requests BOTTLES DRAWN AEROBIC AND ANAEROBIC 5CC EACH  Final   Culture  Setup Time   Final    02/10/2014 10:52 Performed at [facility]    Culture   Final           BLOOD CULTURE RECEIVED NO GROWTH TO DATE CULTURE WILL BE HELD FOR 5 DAYS BEFORE ISSUING A FINAL NEGATIVE REPORT Performed at [facility]    Report Status PENDING  Incomplete  Body fluid culture     Status: None (Preliminary result)   Collection Time: 02/11/14  2:30 PM  Result Value Ref Range Status   Specimen Description FLUID SYNOVIAL RIGHT KNEE  Final   Special Requests NONE  Final   Gram Stain   Final    FEW WBC PRESENT,BOTH PMN AND  MONONUCLEAR NO  ORGANISMS SEEN Performed at [facility]    Culture NO GROWTH Performed at [facility]   Final   Report Status PENDING  Incomplete    Studies/Results: No results found.    Assessment/Plan:  Principal Problem:   AKI (acute kidney injury) Active Problems:   HIV disease   Hyperlipidemia   Anemia   Essential hypertension   Arthralgia of multiple sites, bilateral   Acute renal failure syndrome   Fever   Hematuria   Protein-calorie malnutrition, severe   Lupus nephritis    Kenneth [name] is a 58 y.o. male with  With perfectly controlled HIV (HE HAS NOT HAD A DETECTABLE VIRAL LOAD SINCE NOV, 2010 and that was even low level viremia) with AKI likely due to SLE  #1 HIV: continue BID Tivicay, renally dosed 3tc, and ABC --can check HIV RNA quant keeping in mind in patient lab does NOT appear to properly process specimens and I have seen many falsely elevated viral loads (that being said test could be useful if he has genuine virological failure with high viral load--I am skeptical we will find this --can recheck CD4 but will likely be low due to acute illness  #2 AKI: agree with renal biopsy and I would NOT HOLD BACK ON IMMUNOSUPPRESSION WHATSOEVER and simply treat him as any other patient with potential SLE AKI. My main concern would be that drug drug interactions be checked with his [facility]     LOS: 3 days   Alcide Evener 02/12/2014, 11:13 AM

## 2014-02-13 ENCOUNTER — Inpatient Hospital Stay (HOSPITAL_COMMUNITY): Payer: BC Managed Care – PPO

## 2014-02-13 DIAGNOSIS — M329 Systemic lupus erythematosus, unspecified: Secondary | ICD-10-CM

## 2014-02-13 LAB — PROTIME-INR
INR: 1.3 (ref 0.00–1.49)
Prothrombin Time: 16.3 seconds — ABNORMAL HIGH (ref 11.6–15.2)

## 2014-02-13 LAB — RENAL FUNCTION PANEL
ALBUMIN: 1.6 g/dL — AB (ref 3.5–5.2)
Anion gap: 15 (ref 5–15)
BUN: 80 mg/dL — AB (ref 6–23)
CHLORIDE: 110 meq/L (ref 96–112)
CO2: 14 mEq/L — ABNORMAL LOW (ref 19–32)
Calcium: 8.6 mg/dL (ref 8.4–10.5)
Creatinine, Ser: 5.47 mg/dL — ABNORMAL HIGH (ref 0.50–1.35)
GFR calc Af Amer: 12 mL/min — ABNORMAL LOW (ref >90)
GFR, EST NON AFRICAN AMERICAN: 11 mL/min — AB (ref >90)
Glucose, Bld: 123 mg/dL — ABNORMAL HIGH (ref 70–99)
Phosphorus: 6.2 mg/dL — ABNORMAL HIGH (ref 2.3–4.6)
Potassium: 5.7 mEq/L — ABNORMAL HIGH (ref 3.7–5.3)
Sodium: 139 mEq/L (ref 137–147)

## 2014-02-13 LAB — APTT: aPTT: 31 seconds (ref 24–37)

## 2014-02-13 LAB — T-HELPER CELLS (CD4) COUNT (NOT AT ARMC)
CD4 T CELL HELPER: 18 % — AB (ref 33–55)
CD4 T Cell Abs: 240 /uL — ABNORMAL LOW (ref 400–2700)

## 2014-02-13 MED ORDER — FENTANYL CITRATE 0.05 MG/ML IJ SOLN
INTRAMUSCULAR | Status: AC | PRN
  Administered 2014-02-13: 50 ug via INTRAVENOUS

## 2014-02-13 MED ORDER — LIDOCAINE HCL (PF) 1 % IJ SOLN
INTRAMUSCULAR | Status: AC
  Filled 2014-02-13: qty 10

## 2014-02-13 MED ORDER — DARBEPOETIN ALFA 100 MCG/0.5ML IJ SOSY
100.0000 ug | PREFILLED_SYRINGE | INTRAMUSCULAR | Status: DC
  Administered 2014-02-13: 100 ug via SUBCUTANEOUS
  Filled 2014-02-13 (×2): qty 0.5

## 2014-02-13 MED ORDER — MIDAZOLAM HCL 2 MG/2ML IJ SOLN
INTRAMUSCULAR | Status: AC | PRN
  Administered 2014-02-13: 1 mg via INTRAVENOUS

## 2014-02-13 MED ORDER — MIDAZOLAM HCL 2 MG/2ML IJ SOLN
INTRAMUSCULAR | Status: AC
  Filled 2014-02-13: qty 4

## 2014-02-13 MED ORDER — FENTANYL CITRATE 0.05 MG/ML IJ SOLN
INTRAMUSCULAR | Status: AC
  Filled 2014-02-13: qty 4

## 2014-02-13 NOTE — Progress Notes (Signed)
Family Medicine Teaching Service Daily Progress Note Intern Pager: [phone]  Patient name: Kenneth [name] Medical record number: [id number] Date of birth: Jun 27, 1955 Age: 58 y.o. Gender: male  Primary Care Provider: Cordelia Poche, MD Consultants: Nephrology Code Status: Full  Pt Overview and Major Events to Date:  02/10/14: Admitted to Family Medicine Teaching Service  Assessment and Plan:  Kenneth [name] is a 58 y.o. male presenting with AKI on CKD II, polyarthralgias, SIRS without clear source. 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  # AKI on CKD II: Cr 6.28 on admission (baseline ~1.2, recently 1.37 in 8/15). S/p R nephrectomy in 2011 for congenital hydronephrosis and non-functioning kidney. 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 - Febrile at 101.2 this morning. Suspected to be associated with possible lupus diagnosis. - Creatinine 5.36>5.47 - UA with 100 protein and 7-10 RBCs, concerning for nephritis of some type or nephrotic syndrome but no edema on exam.  - Nephrology consulted--appreciate recommendations  - Renal Biopsy today --follow up results - ASO, RF negative - C3 of 31, C4 of 4--both are low  - Causes of decreased C3/C4 include :systemic lupus erythematous (SLE), vasculitis, hepatitis C-associated cryoglobulinemia, hepatitis B, glomerulonephritis (GN), pneumococcal infection, and gram-negative sepsis---consider these in differential - ANA positive, speckled - dsDNA ab- 6210 - IVF: NS @ 130m/hr - Renal [place] no acute abnormality, cholithiasis - daily weights and strict Is&Os  - Intake: 1480; Output: 1515 - Follow up parathyroid hormone, blood smear, 24hr urine, ANCA  # Polyarthralgias: ~5wk history of joint pain primarily in knees/ankles/hands. No appreciable joint swelling or redness on exam. No rashes noted. ESR and CRP elevated to 135 and 8.3 respectively on  02/02/14. Differential includes lyme disease, parvovirus, HIV (previosuly very low viral loads, compliant with HARRT), lupus, HCV (HCV Ab recently negative), seronegative spondyloarthropathies.  - Follow up Lyme titer--negative - Morphine [id number] prn for pain control - Consider outpatient Rheumatology referral - s/p R and L knee aspirations - Follow up synovial fluid cell count with differential, crystal analysis, culture, and Gram stain  # Cough: Lungs CTAB, non-productive. Could be related to atelectasis - IS - Consider CXR if worsens or becomes productive or lung exam changes.  # Hyperkalemia: K 6.0 on admission. Mildly peaked T waves on EKG. - Potassium 5.7 this morning - Miralax 17g daily - Monitor on telemetry  # HIV disease: Recent CD4 count 390 and viral load <20 (11/11/13) - HAART restarted on 02/10/14 - Follow up viral load and CD4 count - ID consulted: Tivicay increased to BID  # Normocytic Anemia: Hgb 9.7 on admission (baseline 11-12.5). Likely related to renal disease. - Hemoglobin 7.7 this morning - Iron Panel:  Iron 20, UIBC 115, TIBC 135, Ferritin 484  - Indicative of Anemia of Chronic Disease - Continue to monitor   # HTN: Currently well controlled - Continue home amlodipine 1044m Continue to monitor closely in setting of AKI and SIRS  FEN/GI: Renal diet, NS @ 125m20m Prophylaxis: subq heparin per pharm (for renal dosing)  Disposition: Dispo pending renal biopsy.  CIR evaluation.  Subjective:  No complaints, renal biopsy went all right. Unable to assess joint pain yet as has not moved much today. Complains of dry cough x3 days.  Objective: Temp:  [98 F (36.7 C)-98.6 F (37 C)] 98 F (36.7 C) (11/13 0507) Pulse Rate:  [73-94] 73 (11/13 0507) Resp:  [18] 18 (11/13 0507)  BP: (137-150)/(80-85) 137/82 mmHg (11/13 0507) SpO2:  [97 %-100 %] 97 % (11/13 0507) Weight:  [159 lb 1.6 oz (72.167 kg)] 159 lb 1.6 oz (72.167 kg) (11/12 2054) Physical  Exam: General: laying in bed on stomach, NAD Cardiovascular: Unable to assess, as patient laying on stomach Respiratory: Clear to auscultation bilaterally. No wheezing noted. No increased work of breathing.  Laboratory:  Recent Labs Lab 02/10/14 0445 02/11/14 0540 02/12/14 0514  WBC 8.3 9.4 10.0  HGB 8.1* 8.0* 7.7*  HCT 23.9* 23.6* 22.9*  PLT 181 177 176    Recent Labs Lab 02/09/14 2258  02/11/14 0540 02/12/14 0514 02/13/14 0449  NA 136*  < > 138 138 139  K 6.0*  < > 5.8* 5.5* 5.7*  CL 105  < > 112 111 110  CO2 16*  < > 13* 14* 14*  BUN 87*  < > 74* 77* 80*  CREATININE 6.28*  < > 5.29* 5.36* 5.47*  CALCIUM 8.4  < > 7.8* 8.0* 8.6  PROT 8.8*  --   --   --   --   BILITOT 0.2*  --   --   --   --   ALKPHOS 59  --   --   --   --   ALT 8  --   --   --   --   AST 19  --   --   --   --   GLUCOSE 114*  < > 91 100* 123*  < > = values in this interval not displayed.-CK 198   Iron/TIBC/Ferritin/ %Sat    Component Value Date/Time   IRON 20* 02/10/2014 1555   TIBC 135* 02/10/2014 1555   FERRITIN 484* 02/10/2014 1555   IRONPCTSAT 15* 02/10/2014 1555   - CRP 8.3 - Lactic Acid 0.67 - ESR 135 - ASO 114 - Rheumatoid Factor 10 - C3 31 - C4 4 - ANA positive, speckled - dsDNA ab- 6210 - DNA-histone >8.0  Urinalysis    Component Value Date/Time   COLORURINE YELLOW 02/09/2014 2317   APPEARANCEUR CLOUDY* 02/09/2014 2317   LABSPEC 1.017 02/09/2014 2317   PHURINE 5.0 02/09/2014 2317   GLUCOSEU NEGATIVE 02/09/2014 2317   GLUCOSEU NEG mg/dL 08/16/2006 2308   HGBUR LARGE* 02/09/2014 2317   BILIRUBINUR NEGATIVE 02/09/2014 2317   KETONESUR NEGATIVE 02/09/2014 2317   PROTEINUR 100* 02/09/2014 2317   UROBILINOGEN 1.0 02/09/2014 2317   NITRITE NEGATIVE 02/09/2014 2317   LEUKOCYTESUR TRACE* 02/09/2014 2317    - Lyme titer negative  Imaging/Diagnostic Tests: Dg Chest 2 View (02/09/2014):  No acute disease.     US Renal (02/10/2014): Status post right nephrectomy.   Cholelithiasis  No acute abnormality is noted.   Kenneth Paganini, MD 02/13/2014, 8:20 AM PGY-1, [place] Intern pager: [phone], text pages welcome

## 2014-02-13 NOTE — Progress Notes (Signed)
Patient ID: Kenneth [name], male    DOB: 09/03/1955, 58 y.o.   MRN: [id number] Nephrology Progress Note  S: No complaints today. States he was walking yesterday with physical therapy. No pain.  O:BP 137/82 mmHg  Pulse 73  Temp(Src) 98 F (36.7 C) (Oral)  Resp 18  Ht 6\' 6"  (1.981 m)  Wt 159 lb 1.6 oz (72.167 kg)  BMI 18.39 kg/m2  SpO2 97%  Intake/Output Summary (Last 24 hours) at 02/13/14 0727 Last data filed at 02/13/14 0513  Gross per 24 hour  Intake   1430 ml  Output   1500 ml  Net    -70 ml   Intake/Output: I/O last 3 completed shifts: In: 1673 [P.O.:1560; I.V.:3; IV Piggyback:110] Out: 1700 [Urine:1700]  Intake/Output this shift:    Weight change: 0 lb (0 kg) [id number] male, laying in bed in no distress [id number] rate and rhythm, no murmur Resp:Clear to auscultation bilaterally [id number], non-tender, non-distended Ext:LE with 1+ edema in ankles   Recent Labs Lab 02/09/14 2258 02/10/14 0445 02/10/14 1030 02/11/14 0540 02/12/14 0514 02/13/14 0449  NA 136* 137  --  138 138 139  K 6.0* 5.2  --  5.8* 5.5* 5.7*  CL 105 109  --  112 111 110  CO2 16* 15*  --  13* 14* 14*  GLUCOSE 114* 117*  --  91 100* 123*  BUN 87* 81*  --  74* 77* 80*  CREATININE 6.28* 5.67* 5.29* 5.29* 5.36* 5.47*  ALBUMIN 2.0* 1.6*  --   --  1.5* 1.6*  CALCIUM 8.4 7.6*  --  7.8* 8.0* 8.6  PHOS  --  5.3*  --   --  5.3* 6.2*  AST 19  --   --   --   --   --   ALT 8  --   --   --   --   --    Liver Function Tests:  Recent Labs Lab 02/09/14 2258 02/10/14 0445 02/12/14 0514 02/13/14 0449  AST 19  --   --   --   ALT 8  --   --   --   ALKPHOS 59  --   --   --   BILITOT 0.2*  --   --   --   PROT 8.8*  --   --   --   ALBUMIN 2.0* 1.6* 1.5* 1.6*   No results for input(s): LIPASE, AMYLASE in the last 168 hours. No results for input(s): AMMONIA in the last 168 hours. CBC:  Recent Labs Lab 02/09/14 2258 02/10/14 0445 02/11/14 0540 02/12/14 0514  WBC 10.9* 8.3 9.4 10.0  NEUTROABS  8.3*  --   --   --   HGB 9.7* 8.1* 8.0* 7.7*  HCT 28.4* 23.9* 23.6* 22.9*  MCV 88.8 88.8 88.4 90.2  PLT 207 181 177 176   Cardiac Enzymes:  Recent Labs Lab 02/09/14 2347  CKTOTAL 198   CBG: No results for input(s): GLUCAP in the last 168 hours.  Iron Studies:   Recent Labs  02/10/14 1555  IRON 20*  TIBC 135*  FERRITIN 484*    Ref Range 1d ago    C3 Complement 90 - 180 mg/dL 31 (L)     Ref Range 1d ago    Complement C4, Body Fluid 10 - 40 mg/dL 4 (L)      Studies/Results: No results found. Marland Kitchen abacavir  600 mg Oral Daily  . amLODipine  10 mg Oral Daily  . atorvastatin  40 mg Oral q1800  . dolutegravir  50 mg Oral BID  . feeding supplement (NEPRO CARB STEADY)  237 mL Oral BID BM  . ferric gluconate (FERRLECIT/NULECIT) IV  125 mg Intravenous Daily  . heparin subcutaneous  5,000 Units Subcutaneous 3 times per day  . lamiVUDine  50 mg Oral Daily  . pantoprazole  20 mg Oral Daily  . polyethylene glycol  17 g Oral BID  . predniSONE  60 mg Oral Q breakfast  . sodium chloride  3 mL Intravenous Q12H    Assessment/Plan: Kenneth [name] 58 y.o. 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  1. AKI: Baseline creatinine of 1.1-1.2. In August was 1.39. Creatinine on admission of 6.28. Did initially  come down however is slowly rising again is 5.47, up from 5.36. IVF off. Patient taking good PO yesterday. Renal ultrasound (11/10) significant for s/p right nephrectomy. 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 UOP of 1.5L in last 24 hours. With low complements and elvated sed rate, indicative of something extra going on. p-ANCA is positive. Renal biopsy ordered for today.  1. Follow renal function 2. Follow-up biopsy results 3. Continue prednisone 60mg  daily- not sure what significance of atypical P anca is as regular P and C anca were negative- I am going to ask my  colleages, may need cytoxan ? Or increased steroids with solumedrol ? I will change if I feel is needed  2. Polyarthralgia: management per primary. Appears to be working up for rheumatologic cause- - rheum factor negative 1. Continue Prednisone as above- possibly better  3. Normocytic anemia: baseline around 11-13. Down to 7.7 yesterday. Last iron panel in 2010 suggests anemia of chronic disease. No history of melena or bright red blood per rectum. Iron panel today suggest anemia of chronic disease as well as being iron deficient. Getting iron, will add aranesp   1. give iron, then assess for ESA 4. Hematuria: microscopic. Concern this could be intrinsic kidney injury however patient also with evidence of possible UTI. C3, C4 are low. RF negative. ASO, ANA and anti-dna positive. Kidney biopsy will give definitive diagnosis and allow [place] to give him a more focused treatment plan if needed. Atypical P-ANCA positive ? As above   5. UTI: no growth on culture. abx stopped  6. Hyperkalemia: lingering- low K diet   7. HIV: on abacavir, dolutegravir and lamivudine. Recommend continuing therapy. Dolutegravir increased to BID by infectious disease. Primary team and infectious disease managing  8. Hypertension: on amlodipine. Continue per primary  Kenneth Poche, MD PGY-2, [facility] 02/13/2014, 7:27 AM   Patient seen and examined, agree with above note with above modifications. Patients arthralgias are improved with prednisone- s/p renal biopsy but wont get results back until Tuesday likely at earliest.  Not sure what the significance of atypical P anca is- nto sure if needs something stronger empirically until we get results back  Kenneth Parish, MD 02/13/2014

## 2014-02-13 NOTE — Consult Note (Signed)
Physical Medicine and Rehabilitation Consult Reason for Consult:Debilitation/SIRS/HIV Referring Physician: family medicine   HPI: Kenneth [name] is a 58 y.o.right handed male with past medical history of HIV, right nephrectomy 2011 status post pyelonephritis and nonfunctioning kidney, hypertension. 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 Admitted 02/09/2014 with newly diagnosed SLE as well as acute renal failure with creatinine 6.28 from a baseline of 1.37 three months ago.Renal ultrasound with no acute abnormality. Patient had reported ongoing joint pain and swelling for approximately the past 5 weeks with decreased appetite and some loose stools. Placed on normal saline IV fluids per renal services.it was not felt that his HAART therapy had caused his acute renal injury. ANA was positive.ESR and CRP elevated to 135 and 8.3 respectively.Maintained on prednisone therapy.Plan for interventional radiology ultrasound-guided random renal biopsy. Presently on subcutaneous heparin for DVT prophylaxis.Physical therapy evaluation completed and ongoing with recommendations of physical medicine rehabilitation consult.   Review of Systems  Constitutional: Positive for fever, weight loss and malaise/fatigue.  Gastrointestinal: Positive for diarrhea.  Musculoskeletal: Positive for myalgias and joint pain.  Neurological: Positive for weakness.  Psychiatric/Behavioral: The patient has insomnia.    Past Medical History  Diagnosis Date  . HIV infection   . Hyperlipidemia   . Hypertension   . Insomnia   . Dysphagia   . Achalasia   . Renal abscess     Stage II kidney disease  . Hemorrhoids, internal   . Arthritis    Past Surgical History  Procedure Laterality Date  . Nephrectomy      Right   Family History  Problem Relation Age of Onset  . Colon cancer Father    Social History:  reports that he has never smoked. He has never used smokeless  tobacco. He reports that he does not drink alcohol or use illicit drugs. Allergies:  Allergies  Allergen Reactions  . Amoxicillin     REACTION: diffuse rash   Medications Prior to Admission  Medication Sig Dispense Refill  . Abacavir-Dolutegravir-Lamivud 600-50-300 MG TABS Take 1 tablet by mouth daily. 90 tablet 6  . acetaminophen (TYLENOL) 500 MG tablet Take 500 mg by mouth every 6 (six) hours as needed for mild pain.    Marland Kitchen amLODipine (NORVASC) 10 MG tablet Take 1 tablet (10 mg total) by mouth daily. 30 tablet 11  . atorvastatin (LIPITOR) 40 MG tablet TAKE 1/2 TABLET BY MOUTH DAILY (Patient taking differently: Take 1/2 tablet by mouth daily) 30 tablet 11  . cyclobenzaprine (FLEXERIL) 5 MG tablet Take 5 mg by mouth 3 (three) times daily as needed for muscle spasms.    . traMADol (ULTRAM) 50 MG tablet Take 1 tablet (50 mg total) by mouth every 8 (eight) hours as needed. (Patient taking differently: Take 50 mg by mouth every 8 (eight) hours as needed for moderate pain. ) 60 tablet 0  . pantoprazole (PROTONIX) 20 MG tablet Take 1 tablet (20 mg total) by mouth daily. 14 tablet 0    Home: Home Living Family/patient expects to be discharged to:: Private residence Living Arrangements: Alone Available Help at Discharge: Other (Comment) (Family support in [place]) Type of Home: House (Condo) Home Access: Level entry Home Layout: One level Home Equipment: Shower seat - built in  Functional History: Prior Function Level of Independence: Independent with assistive device(s) Comments: drives; works at [employer] in [place] Status:  Mobility: Bed Mobility Overal bed mobility: Needs Assistance Bed Mobility: Supine to Sit, Sit  to Supine Rolling: Supervision Sidelying to sit: Min assist Supine to sit: Supervision Sit to supine: Supervision General bed mobility comments: No physical assist required. Supervision for safety.  Transfers Overall transfer level: Needs assistance Equipment  used: Rolling walker (2 wheeled) Transfers: Sit to/from Stand Sit to Stand: Min guard Stand pivot transfers: Mod assist General transfer comment: No physical assist required. Pt was able to power-up to full standing with increased time and guarding for safety.  Ambulation/Gait Ambulation/Gait assistance: Min guard Ambulation Distance (Feet): 40 Feet Assistive device: Rolling walker (2 wheeled) Gait Pattern/deviations: Decreased stride length, Step-to pattern, Step-through pattern, Trunk flexed Gait velocity: Decreased Gait velocity interpretation: Below normal speed for age/gender General Gait Details: Pt was cued for increased step/stride length on RLE for step-through gait pattern. Pt states he has no pain during ambulation.     ADL: ADL Overall ADL's : Needs assistance/impaired Grooming: Set up, Sitting Upper Body Bathing: Set up, Supervision/ safety, Sitting Lower Body Bathing: Moderate assistance, Sit to/from stand Upper Body Dressing : Supervision/safety, Set up, Sitting Lower Body Dressing: Moderate assistance, Sit to/from stand Toilet Transfer: Moderate assistance, Buyer, retail Details (indicate cue type and reason): from lower level Toileting- Water quality scientist and Hygiene: Min guard, Sit to/from stand Functional mobility during ADLs: Moderate assistance General ADL Comments: Lengthy discussion with pt regarding PLOF. Pt states that he had increasing difficult5y with his self care over the last 4-5 weeks. States it would take him hours to get ready in the am before work. States he had difficulty with mobility so that he could not make it to the bathroom in time. Tearful at times.  Cognition: Cognition Overall Cognitive Status: Within Functional Limits for tasks assessed Cognition Arousal/Alertness: Awake/alert Behavior During Therapy: WFL for tasks assessed/performed Overall Cognitive Status: Within Functional Limits for tasks assessed  Blood pressure  137/82, pulse 73, temperature 98 F (36.7 C), temperature source Oral, resp. rate 18, height _0  (1.981 m), weight 72.167 kg (159 lb 1.6 oz), SpO2 97 %. Physical Exam  Constitutional: He is oriented to person, place, and time.  HENT:  Head: Normocephalic.  Eyes: EOM are normal.  Neck: Normal range of motion. Neck supple. No thyromegaly present.  Cardiovascular: Normal rate and regular rhythm.   Respiratory: Effort normal and breath sounds normal. No respiratory distress.  GI: Soft. Bowel sounds are normal. He exhibits no distension.  Musculoskeletal:  Mild knee pain/effusion bilaterally  Neurological: He is alert and oriented to person, place, and time. No cranial nerve deficit. Coordination normal.  No gross sensory loss. UE: 4/5 delt,bic,tric,grip. LE: 3/5 HF, 4-KE and ADF/APF  Skin: Skin is warm and dry.  Psychiatric: He has a normal mood and affect. His behavior is normal.    Results for orders placed or performed during the hospital encounter of 02/09/14 (from the past 24 hour(s))  Protime-INR     Status: Abnormal   Collection Time: 02/13/14  4:44 AM  Result Value Ref Range   Prothrombin Time 16.3 (H) 11.6 - 15.2 seconds   INR 1.30 0.00 - 1.49  APTT     Status: None   Collection Time: 02/13/14  4:44 AM  Result Value Ref Range   aPTT 31 24 - 37 seconds   No results found.  Assessment/Plan: Diagnosis: fxnl deficits related to SLE and deconditioning from multiple medical issues 1. Does the need for close, 24 hr/day medical supervision in concert with the patient's rehab needs make it unreasonable for this patient to be served in a  less intensive setting? Yes 2. Co-Morbidities requiring supervision/potential complications: htn, anemia, AKI,  3. Due to bladder management, bowel management, safety, skin/wound care, disease management, medication administration, pain management and patient education, does the patient require 24 hr/day rehab nursing? Yes 4. Does the patient  require coordinated care of a physician, rehab nurse, PT (1-2 hrs/day, 5 days/week) and OT (1-2 hrs/day, 5 days/week) to address physical and functional deficits in the context of the above medical diagnosis(es)? Yes Addressing deficits in the following areas: balance, endurance, locomotion, strength, transferring, bowel/bladder control, bathing, dressing, feeding, grooming, toileting and psychosocial support 5. Can the patient actively participate in an intensive therapy program of at least 3 hrs of therapy per day at least 5 days per week? Yes 6. The potential for patient to make measurable gains while on inpatient rehab is excellent 7. Anticipated functional outcomes upon discharge from inpatient rehab are modified independent  with PT, modified independent and supervision with OT, n/a with SLP. 8. Estimated rehab length of stay to reach the above functional goals is: 8-12 days 9. Does the patient have adequate social supports and living environment to accommodate these discharge functional goals? Potentially 10. Anticipated D/C setting: Home 11. Anticipated post D/C treatments: [place] therapy 12. Overall Rehab/Functional Prognosis: excellent  RECOMMENDATIONS: This patient's condition is appropriate for continued rehabilitative care in the following setting: CIR Patient has agreed to participate in recommended program. Potentially Note that insurance prior authorization may be required for reimbursement for recommended care.  Comment: Rehab Admissions Coordinator to follow up.  Thanks,  [name], MD, Mellody Drown     02/13/2014

## 2014-02-13 NOTE — Progress Notes (Signed)
[facility] for Infectious Disease    Subjective: Sp renal bx   Antibiotics:  Anti-infectives    Start     Dose/Rate Route Frequency Ordered Stop   02/11/14 2200  dolutegravir (TIVICAY) tablet 50 mg  Status:  Discontinued     50 mg Oral 2 times daily 02/11/14 1517 02/11/14 1517   02/11/14 1530  dolutegravir (TIVICAY) tablet 50 mg     50 mg Oral 2 times daily 02/11/14 1517     02/10/14 1330  doxycycline (VIBRAMYCIN) 100 mg in dextrose 5 % 250 mL IVPB  Status:  Discontinued     100 mg125 mL/hr over 120 Minutes Intravenous Every 12 hours 02/10/14 0304 02/10/14 0721   02/10/14 1200  ciprofloxacin (CIPRO) tablet 250 mg  Status:  Discontinued     250 mg Oral 2 times daily 02/10/14 1141 02/11/14 1517   02/10/14 1000  Abacavir-Dolutegravir-Lamivud 600-50-300 MG TABS 1 tablet  Status:  Discontinued     1 tablet Oral Daily 02/10/14 0833 02/10/14 0836   02/10/14 1000  dolutegravir (TIVICAY) tablet 50 mg  Status:  Discontinued     50 mg Oral Daily 02/10/14 0840 02/11/14 1517   02/10/14 1000  lamiVUDine (EPIVIR) 10 MG/ML solution 50 mg     50 mg Oral Daily 02/10/14 0840     02/10/14 1000  abacavir (ZIAGEN) tablet 600 mg     600 mg Oral Daily 02/10/14 0840     02/10/14 0100  doxycycline (VIBRAMYCIN) 100 mg in dextrose 5 % 250 mL IVPB     100 mg125 mL/hr over 120 Minutes Intravenous  Once 02/10/14 0041 02/10/14 0334      Medications: Scheduled Meds: . abacavir  600 mg Oral Daily  . amLODipine  10 mg Oral Daily  . atorvastatin  40 mg Oral q1800  . darbepoetin (ARANESP) injection - NON-DIALYSIS  100 mcg Subcutaneous Q Fri-1800  . dolutegravir  50 mg Oral BID  . feeding supplement (NEPRO CARB STEADY)  237 mL Oral BID BM  . fentaNYL      . heparin subcutaneous  5,000 Units Subcutaneous 3 times per day  . lamiVUDine  50 mg Oral Daily  . lidocaine (PF)      . midazolam      . pantoprazole  20 mg Oral Daily  . polyethylene glycol  17 g Oral BID  . predniSONE  60 mg Oral Q breakfast    . sodium chloride  3 mL Intravenous Q12H   Continuous Infusions:  PRN Meds:.acetaminophen **OR** acetaminophen, morphine injection    Objective: Weight change: 0 lb (0 kg)  Intake/Output Summary (Last 24 hours) at 02/13/14 1622 Last data filed at 02/13/14 0513  Gross per 24 hour  Intake    480 ml  Output   1050 ml  Net   -570 ml   Blood pressure 123/89, pulse 88, temperature 98 F (36.7 C), temperature source Oral, resp. rate 13, height 6\' 6"  (1.981 m), weight 159 lb 1.6 oz (72.167 kg), SpO2 98 %. Temp:  [98 F (36.7 C)-98.2 F (36.8 C)] 98 F (36.7 C) (11/13 0507) Pulse Rate:  [73-94] 88 (11/13 0948) Resp:  [13-18] 13 (11/13 0948) BP: (121-150)/(65-89) 123/89 mmHg (11/13 0955) SpO2:  [97 %-100 %] 98 % (11/13 0955) Weight:  [159 lb 1.6 oz (72.167 kg)] 159 lb 1.6 oz (72.167 kg) (11/12 2054)  Physical Exam: General: Alert and awake, oriented x3, not in any acute distress. HEENT: anicteric sclera, pupils reactive to light and  accommodation, EOMI CVS regular rate, normal r,  no murmur rubs or gallops Chest: clear to auscultation bilaterally, no wheezing, rales or rhonchi Abdomen: soft nontender, nondistended, normal bowel sounds, Neuro: nonfocal  CBC: CBC Latest Ref Rng 02/12/2014 02/11/2014 02/10/2014  WBC 4.0 - 10.5 K/uL 10.0 9.4 8.3  Hemoglobin 13.0 - 17.0 g/dL 7.7(L) 8.0(L) 8.1(L)  Hematocrit 39.0 - 52.0 % 22.9(L) 23.6(L) 23.9(L)  Platelets 150 - 400 K/uL 176 177 181      BMET  Recent Labs  02/12/14 0514 02/13/14 0449  NA 138 139  K 5.5* 5.7*  CL 111 110  CO2 14* 14*  GLUCOSE 100* 123*  BUN 77* 80*  CREATININE 5.36* 5.47*  CALCIUM 8.0* 8.6     Liver Panel   Recent Labs  02/12/14 0514 02/13/14 0449  ALBUMIN 1.5* 1.6*       Sedimentation Rate No results for input(s): ESRSEDRATE in the last 72 hours. C-Reactive Protein No results for input(s): CRP in the last 72 hours.  Micro Results: Recent Results (from the past 720 hour(s))  Urine  culture     Status: None   Collection Time: 02/09/14 11:17 PM  Result Value Ref Range Status   Specimen Description URINE, RANDOM  Final   Special Requests NONE  Final   Culture  Setup Time   Final    02/10/2014 05:59 Performed at [facility] NO GROWTH Performed at [facility]   Final   Culture NO GROWTH Performed at [facility]   Final   Report Status 02/11/2014 FINAL  Final  Culture, blood (routine x 2)     Status: None (Preliminary result)   Collection Time: 02/10/14  4:45 AM  Result Value Ref Range Status   Specimen Description BLOOD LEFT ARM  Final   Special Requests BOTTLES DRAWN AEROBIC AND ANAEROBIC [facility] EACH  Final   Culture  Setup Time   Final    02/10/2014 10:52 Performed at [facility]    Culture   Final           BLOOD CULTURE RECEIVED NO GROWTH TO DATE CULTURE WILL BE HELD FOR 5 DAYS BEFORE ISSUING A FINAL NEGATIVE REPORT Performed at [facility]    Report Status PENDING  Incomplete  Culture, blood (routine x 2)     Status: None (Preliminary result)   Collection Time: 02/10/14  4:53 AM  Result Value Ref Range Status   Specimen Description BLOOD RIGHT HAND  Final   Special Requests BOTTLES DRAWN AEROBIC AND ANAEROBIC 5CC EACH  Final   Culture  Setup Time   Final    02/10/2014 10:52 Performed at [facility]    Culture   Final           BLOOD CULTURE RECEIVED NO GROWTH TO DATE CULTURE WILL BE HELD FOR 5 DAYS BEFORE ISSUING A FINAL NEGATIVE REPORT Performed at [facility]    Report Status PENDING  Incomplete  Body fluid culture     Status: None (Preliminary result)   Collection Time: 02/11/14  2:30 PM  Result Value Ref Range Status   Specimen Description FLUID SYNOVIAL RIGHT KNEE  Final   Special Requests NONE  Final   Gram Stain   Final    FEW WBC PRESENT,BOTH PMN AND MONONUCLEAR NO ORGANISMS SEEN Performed at [facility]    Culture   Final    NO GROWTH 1  DAY Performed at [facility]  Report Status PENDING  Incomplete    Studies/Results: US Biopsy  02/13/2014   CLINICAL DATA:  Lupus nephritis  EXAM: ULTRASOUND GUIDED CORE BIOPSY OF LEFT KIDNEY CORTEX  MEDICATIONS: 1 mg IV Versed; 50 mcg IV Fentanyl  Total Moderate Sedation Time: 15  PROCEDURE: The procedure, risks, benefits, and alternatives were explained to the patient. Questions regarding the procedure were encouraged and answered. The patient understands and consents to the procedure.  The LEFT FLANK was prepped with BETADINE in a sterile fashion, and a sterile drape was applied covering the operative field. A sterile gown and sterile gloves were used for the procedure. Local anesthesia was provided with 1% Lidocaine.  Patient positioned prone. Preliminary ultrasound performed. Left kidney lower pole cortex was localized. Left kidney is noted to be diffusely echogenic compatible with medical renal disease. No hydronephrosis. Under sterile conditions and local anesthesia, ultrasound guided needle access was performed of the left kidney lower pole cortex. 16 gauge core biopsy needle advanced to the cortex. 2 16 gauge core biopsies obtained. Samples placed in saline. Postprocedure imaging demonstrates no hemorrhage or hematoma.  COMPLICATIONS: None.  FINDINGS: Imaging confirms needle placement to the left kidney cortex for core biopsy  IMPRESSION: Successful ultrasound left renal cortex core biopsy.   Electronically Signed   By: Daryll Brod M.D.   On: 02/13/2014 10:37      Assessment/Plan:  Principal Problem:   AKI (acute kidney injury) Active Problems:   HIV disease   Hyperlipidemia   Anemia   Essential hypertension   Arthralgia of multiple sites, bilateral   Acute renal failure syndrome   Fever   Hematuria   Protein-calorie malnutrition, severe   Lupus nephritis    Kenneth [name] is a 58 y.o. male with  With perfectly controlled HIV (HE HAS NOT HAD A DETECTABLE VIRAL  LOAD SINCE NOV, 2010 and that was even low level viremia) with AKI likely due to SLE  #1 HIV: continue BID Tivicay, renally dosed 3tc, and ABC  I reviewed everything with patient and HE NEVER MISSES doses so his AKI has nothing to do with his HIV  #2 AKI: agree with renal biopsy and I would NOT HOLD BACK ON IMMUNOSUPPRESSIVES. WOULD DEFER TO RENAL BUT I PERSONALLY WOULD FAVOR "[place]" HIM WITH HIGH DOSE SOLUMEDROL 1 GRAM BIDWHILE BIOPSY RESULTS ARE PENDING   My main concern WITH RE TO HIV would be that drug drug interactions be checked with his ARVS  I will sign off for now but please call us back when and IF his renal fx improves so we can adjust his ARVS and provide proper followup     LOS: 4 days   Alcide Evener 02/13/2014, 4:22 PM

## 2014-02-13 NOTE — Progress Notes (Signed)
Rehab admissions - I am following pt's case and spoke with pt by phone to explain the possibility of inpatient rehab. Initial questions were answered and pt is interested in pursuing inpatient rehab. Pt is doing well, ambulating 40' min guard assist though he lives alone and he is not sure how he will progress over the weekend.   Pt has BCBS and we will now open the case with his insurance to seek authorization.   We will see how pt progresses over the weekend with therapies.  We will consider possible inpatient rehab admit pending his progress with therapies, his medical clearance, insurance authorization and our bed availability.  I will check on pt's status on Monday. Please call me with any questions. Thanks.  Nanetta Batty, PT Rehabilitation Admissions Coordinator [phone]

## 2014-02-13 NOTE — Procedures (Signed)
Successful LT RENAL BX NO COMP STABLE PATH PENDING FULL REPORT IN PACS

## 2014-02-13 NOTE — Plan of Care (Signed)
Problem: Phase I Progression Outcomes Goal: Vital Signs stable- temperature less than 102 Outcome: Completed/Met Date Met:  02/13/14  Problem: Phase II Progression Outcomes Goal: Vital signs remain stable, temperature < 100 Outcome: Completed/Met Date Met:  02/13/14

## 2014-02-14 LAB — BASIC METABOLIC PANEL
ANION GAP: 16 — AB (ref 5–15)
BUN: 90 mg/dL — ABNORMAL HIGH (ref 6–23)
CALCIUM: 8.3 mg/dL — AB (ref 8.4–10.5)
CO2: 13 meq/L — AB (ref 19–32)
CREATININE: 5.4 mg/dL — AB (ref 0.50–1.35)
Chloride: 108 mEq/L (ref 96–112)
GFR calc Af Amer: 12 mL/min — ABNORMAL LOW (ref >90)
GFR, EST NON AFRICAN AMERICAN: 11 mL/min — AB (ref >90)
Glucose, Bld: 145 mg/dL — ABNORMAL HIGH (ref 70–99)
Potassium: 5.9 mEq/L — ABNORMAL HIGH (ref 3.7–5.3)
Sodium: 137 mEq/L (ref 137–147)

## 2014-02-14 LAB — RENAL FUNCTION PANEL
Albumin: 1.5 g/dL — ABNORMAL LOW (ref 3.5–5.2)
Anion gap: 15 (ref 5–15)
BUN: 85 mg/dL — ABNORMAL HIGH (ref 6–23)
CALCIUM: 8.3 mg/dL — AB (ref 8.4–10.5)
CO2: 12 meq/L — AB (ref 19–32)
CREATININE: 5.21 mg/dL — AB (ref 0.50–1.35)
Chloride: 109 mEq/L (ref 96–112)
GFR calc Af Amer: 13 mL/min — ABNORMAL LOW (ref >90)
GFR calc non Af Amer: 11 mL/min — ABNORMAL LOW (ref >90)
Glucose, Bld: 117 mg/dL — ABNORMAL HIGH (ref 70–99)
Phosphorus: 6.6 mg/dL — ABNORMAL HIGH (ref 2.3–4.6)
Potassium: 6.8 mEq/L (ref 3.7–5.3)
Sodium: 136 mEq/L — ABNORMAL LOW (ref 137–147)

## 2014-02-14 LAB — CBC
HCT: 25.2 % — ABNORMAL LOW (ref 39.0–52.0)
Hemoglobin: 8.5 g/dL — ABNORMAL LOW (ref 13.0–17.0)
MCH: 30.1 pg (ref 26.0–34.0)
MCHC: 33.7 g/dL (ref 30.0–36.0)
MCV: 89.4 fL (ref 78.0–100.0)
PLATELETS: 222 10*3/uL (ref 150–400)
RBC: 2.82 MIL/uL — AB (ref 4.22–5.81)
RDW: 14.3 % (ref 11.5–15.5)
WBC: 8.6 10*3/uL (ref 4.0–10.5)

## 2014-02-14 MED ORDER — SODIUM POLYSTYRENE SULFONATE 15 GM/60ML PO SUSP
30.0000 g | Freq: Once | ORAL | Status: AC
  Administered 2014-02-14: 30 g via ORAL
  Filled 2014-02-14: qty 120

## 2014-02-14 MED ORDER — STERILE WATER FOR INJECTION IV SOLN
INTRAVENOUS | Status: DC
  Administered 2014-02-14 – 2014-02-15 (×3): via INTRAVENOUS
  Filled 2014-02-14 (×5): qty 850

## 2014-02-14 MED ORDER — SODIUM POLYSTYRENE SULFONATE 15 GM/60ML PO SUSP
15.0000 g | Freq: Once | ORAL | Status: AC
  Administered 2014-02-14: 15 g via ORAL
  Filled 2014-02-14: qty 60

## 2014-02-14 MED ORDER — FUROSEMIDE 10 MG/ML IJ SOLN
60.0000 mg | Freq: Once | INTRAMUSCULAR | Status: AC
  Administered 2014-02-14: 60 mg via INTRAVENOUS
  Filled 2014-02-14: qty 6

## 2014-02-14 NOTE — Progress Notes (Signed)
Patient ID: Kenneth [name], male    DOB: 1955/11/07, 58 y.o.   MRN: [id number] Nephrology Progress Note  S: No complaints today, says joint pain much better. States he was walking yesterday with physical therapy. No pain.  Family , sister and brother in law at bedside.  Chemistries not resulted today  O:BP 144/87 mmHg  Pulse 85  Temp(Src) 98.6 F (37 C) (Oral)  Resp 18  Ht 6\' 6"  (1.981 m)  Wt 72.167 kg (159 lb 1.6 oz)  BMI 18.39 kg/m2  SpO2 98%  Intake/Output Summary (Last 24 hours) at 02/14/14 1152 Last data filed at 02/14/14 0900  Gross per 24 hour  Intake    720 ml  Output   1900 ml  Net  -1180 ml   Intake/Output: I/O last 3 completed shifts: In: 19 [P.O.:720] Out: 2050 [Urine:2050]  Intake/Output this shift:  Total I/O In: 120 [P.O.:120] Out: 450 [Urine:450] Weight change: 0 kg (0 lb) [id number] male, laying in bed in no distress [id number] rate and rhythm, no murmur Resp:Clear to auscultation bilaterally [id number], non-tender, non-distended Ext:LE with 1+ edema in ankles   Recent Labs Lab 02/09/14 2258 02/10/14 0445 02/10/14 1030 02/11/14 0540 02/12/14 0514 02/13/14 0449  NA 136* 137  --  138 138 139  K 6.0* 5.2  --  5.8* 5.5* 5.7*  CL 105 109  --  112 111 110  CO2 16* 15*  --  13* 14* 14*  GLUCOSE 114* 117*  --  91 100* 123*  BUN 87* 81*  --  74* 77* 80*  CREATININE 6.28* 5.67* 5.29* 5.29* 5.36* 5.47*  ALBUMIN 2.0* 1.6*  --   --  1.5* 1.6*  CALCIUM 8.4 7.6*  --  7.8* 8.0* 8.6  PHOS  --  5.3*  --   --  5.3* 6.2*  AST 19  --   --   --   --   --   ALT 8  --   --   --   --   --    Liver Function Tests:  Recent Labs Lab 02/09/14 2258 02/10/14 0445 02/12/14 0514 02/13/14 0449  AST 19  --   --   --   ALT 8  --   --   --   ALKPHOS 59  --   --   --   BILITOT 0.2*  --   --   --   PROT 8.8*  --   --   --   ALBUMIN 2.0* 1.6* 1.5* 1.6*   No results for input(s): LIPASE, AMYLASE in the last 168 hours. No results for input(s): AMMONIA in the last 168  hours. CBC:  Recent Labs Lab 02/09/14 2258 02/10/14 0445 02/11/14 0540 02/12/14 0514 02/14/14 0740  WBC 10.9* 8.3 9.4 10.0 8.6  NEUTROABS 8.3*  --   --   --   --   HGB 9.7* 8.1* 8.0* 7.7* 8.5*  HCT 28.4* 23.9* 23.6* 22.9* 25.2*  MCV 88.8 88.8 88.4 90.2 89.4  PLT 207 181 177 176 222   Cardiac Enzymes:  Recent Labs Lab 02/09/14 2347  CKTOTAL 198   CBG: No results for input(s): GLUCAP in the last 168 hours.  Iron Studies:  No results for input(s): IRON, TIBC, TRANSFERRIN, FERRITIN in the last 72 hours.  Ref Range 1d ago    C3 Complement 90 - 180 mg/dL 31 (L)     Ref Range 1d ago    Complement C4, Body Fluid 10 - 40 mg/dL 4 (L)  Studies/Results: US Biopsy  02/13/2014   CLINICAL DATA:  Lupus nephritis  EXAM: ULTRASOUND GUIDED CORE BIOPSY OF LEFT KIDNEY CORTEX  MEDICATIONS: 1 mg IV Versed; 50 mcg IV Fentanyl  Total Moderate Sedation Time: 15  PROCEDURE: The procedure, risks, benefits, and alternatives were explained to the patient. Questions regarding the procedure were encouraged and answered. The patient understands and consents to the procedure.  The LEFT FLANK was prepped with BETADINE in a sterile fashion, and a sterile drape was applied covering the operative field. A sterile gown and sterile gloves were used for the procedure. Local anesthesia was provided with 1% Lidocaine.  Patient positioned prone. Preliminary ultrasound performed. Left kidney lower pole cortex was localized. Left kidney is noted to be diffusely echogenic compatible with medical renal disease. No hydronephrosis. Under sterile conditions and local anesthesia, ultrasound guided needle access was performed of the left kidney lower pole cortex. 16 gauge core biopsy needle advanced to the cortex. 2 16 gauge core biopsies obtained. Samples placed in saline. Postprocedure imaging demonstrates no hemorrhage or hematoma.  COMPLICATIONS: None.  FINDINGS: Imaging confirms needle placement to the left kidney  cortex for core biopsy  IMPRESSION: Successful ultrasound left renal cortex core biopsy.   Electronically Signed   By: Daryll Brod M.D.   On: 02/13/2014 10:37   . abacavir  600 mg Oral Daily  . amLODipine  10 mg Oral Daily  . atorvastatin  40 mg Oral q1800  . darbepoetin (ARANESP) injection - NON-DIALYSIS  100 mcg Subcutaneous Q Fri-1800  . dolutegravir  50 mg Oral BID  . feeding supplement (NEPRO CARB STEADY)  237 mL Oral BID BM  . heparin subcutaneous  5,000 Units Subcutaneous 3 times per day  . lamiVUDine  50 mg Oral Daily  . pantoprazole  20 mg Oral Daily  . polyethylene glycol  17 g Oral BID  . predniSONE  60 mg Oral Q breakfast  . sodium chloride  3 mL Intravenous Q12H    Assessment/Plan: [name] 58 y.o. 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  1. AKI: Baseline creatinine of 1.1-1.2. In August was 1.39. Creatinine on admission of 6.28. Did initially  come down however is slowly rising again is 5.47, up from 5.36.No creatinine today yet.  IVF off. Patient taking good PO's.  Renal ultrasound (11/10) significant for s/p right nephrectomy. 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 UOP of 1.5L in last 24 hours. With low complements and elvated sed rate, indicative of something extra going on. Atypical p-ANCA is positive. Renal biopsy done Friday. Follow renal function 2. Follow-up biopsy results Continue prednisone 60mg  daily- not sure what significance of atypical P anca is as regular P and C anca were negative- I guess it just can be consistent with lupus.  Will continue just PO pred right now- await biopsy results for further treatment needs.  Patient is not uremic and there are no indications for dialysis at this time. 2. Polyarthralgia: management per primary. Appears to be working up for rheumatologic cause- - rheum factor negative 1. Continue Prednisone as above-  possibly better  3. Normocytic anemia: baseline around 11-13. Down here.Iron panel today suggest anemia of chronic disease as well as being iron deficient. Received  iron, have added aranesp   4. Hematuria: microscopic. Concern this could be intrinsic kidney injury however patient also with evidence of possible UTI. C3, C4 are low. RF negative. ASO,  ANA and anti-dna positive. Kidney biopsy will give definitive diagnosis and allow [place] to give him a more focused treatment plan if needed.   5. UTI: no growth on culture. abx stopped  6. Hyperkalemia: lingering- low K diet   7. HIV: on abacavir, dolutegravir and lamivudine. Recommend continuing therapy. Dolutegravir increased to BID by infectious disease. Primary team and infectious disease managing  8. Hypertension: on amlodipine. Continue per primary    Corliss Parish, MD 02/14/2014

## 2014-02-14 NOTE — Progress Notes (Signed)
CRITICAL VALUE ALERT  Critical value received:  K 6.8  Date of notification:  02/14/14  Time of notification:  1220  Critical value read back: Yes  Nurse who received alert:  Tyna Jaksch  MD notified (1st page):  [phone]  Time of first page:  1223  Responding MD:    Time MD responded:  1224

## 2014-02-14 NOTE — Progress Notes (Signed)
i see potassium back at 6.8- i agree with kayexylate.  I am also going to start a bicarb infusion for his bicarb of 12 and give him one dose of IV lasix to assist in resolution   Kateri Balch A

## 2014-02-14 NOTE — Progress Notes (Signed)
Family Medicine Teaching Service Daily Progress Note Intern Pager: [phone]  Patient name: Kenneth [name] Medical record number: [id number] Date of birth: 10-31-1955 Age: 58 y.o. Gender: male  Primary Care Provider: Cordelia Poche, MD Consultants: Nephrology Code Status: Full  Pt Overview and Major Events to Date:  02/10/14: Admitted to Family Medicine Teaching Service  Assessment and Plan:  MIKELL KAZLAUSKAS is a 58 y.o. male presenting with AKI on CKD II, polyarthralgias, SIRS without clear source. 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  # AKI on CKD II: Cr 6.28 on admission (baseline ~1.2, recently 1.37 in 8/15). S/p R nephrectomy in 2011 for congenital hydronephrosis and non-functioning kidney. 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 - Creatinine 5.7 this morning - UA with 100 protein and 7-10 RBCs, concerning for nephritis of some type or nephrotic syndrome but no edema on exam.  - Nephrology consulted--appreciate recommendations  - Renal Biopsy--follow up results - ASO, RF negative - C3 of 31, C4 of 4--both are low - ANA positive, speckled - dsDNA ab- 6210 (high) - DNA histone ab- positive - Parathyroid hormone 124 (high) with normal to low calcium (8.6 today) - pANCA positive - IVF: NS @ 135m/hr - Renal [place] no acute abnormality, cholithiasis - daily weights and strict Is&Os  - Intake: 1480; Output: 1515  # Polyarthralgias: ~5wk history of joint pain primarily in knees/ankles/hands. ESR and CRP elevated to 135 and 8.3 respectively on 02/02/14.  - Lyme titer negative - Morphine 221mq2h prn for pain control - Consider outpatient Rheumatology referral - s/p R and L knee aspirations - Follow up synovial fluid cell count with differential, crystal analysis, culture, and Gram stain  - Straw colored and cloudy, WBC 1025 (high) and Neutrophils 80 (high)  # Cough: Lungs CTAB,  non-productive. Could be related to atelectasis - IS - Consider CXR if worsens or becomes productive or lung exam changes.  # Hyperkalemia: K 6.0 on admission. Mildly peaked T waves on EKG. - Potassium 5.7 this morning - Miralax 17g daily - Monitor on telemetry  # HIV disease: Recent CD4 count 390 and viral load <20 (11/11/13) - HAART restarted on 02/10/14 - Follow up viral load and CD4 count - ID consulted: [place] increased to BID  # Normocytic Anemia: Hgb 9.7 on admission (baseline 11-12.5). Likely related to renal disease. - Hemoglobin 8.5 this morning - Iron Panel:  Iron 20, UIBC 115, TIBC 135, Ferritin 484  - Indicative of Anemia of Chronic Disease - Continue to monitor   # HTN: Currently well controlled - Continue home amlodipine 1074m Continue to monitor closely in setting of AKI and SIRS  FEN/GI: Renal diet, NS @ 125m7m Prophylaxis: subq heparin per pharm (for renal dosing)  Disposition: Dispo pending renal biopsy.  CIR evaluation.  Subjective:  No acute complaints overnight. Denies pain today. Continues to note intermittent mild dry cough. Has one bowel movement per day. No further complaints today.  Objective: Temp:  [98.1 F (36.7 C)-98.6 F (37 C)] 98.6 F (37 C) (11/14 0442) Pulse Rate:  [70-90] 70 (11/14 0442) Resp:  [13-18] 18 (11/14 0442) BP: (118-131)/(65-89) 131/67 mmHg (11/14 0442) SpO2:  [97 %-100 %] 99 % (11/14 0442) Weight:  [159 lb 1.6 oz (72.167 kg)] 159 lb 1.6 oz (72.167 kg) (11/13 2039) Physical Exam: General: [id number] resting comfortably in no apparent distress Cardiovascular: S1 and S2 noted. No murmurs/rubs/gallops. Respiratory: Clear to  auscultation bilaterally. No wheezing noted. No increased work of breathing. Abdomen: Bowel sounds noted. Nontender to palpation. Extremities: No edema in ankles. Pulses palpable. No effusions of knee present  Laboratory:  Recent Labs Lab 02/11/14 0540 02/12/14 0514 02/14/14 0740  WBC 9.4 10.0  8.6  HGB 8.0* 7.7* 8.5*  HCT 23.6* 22.9* 25.2*  PLT 177 176 222    Recent Labs Lab 02/09/14 2258  02/11/14 0540 02/12/14 0514 02/13/14 0449  NA 136*  < > 138 138 139  K 6.0*  < > 5.8* 5.5* 5.7*  CL 105  < > 112 111 110  CO2 16*  < > 13* 14* 14*  BUN 87*  < > 74* 77* 80*  CREATININE 6.28*  < > 5.29* 5.36* 5.47*  CALCIUM 8.4  < > 7.8* 8.0* 8.6  PROT 8.8*  --   --   --   --   BILITOT 0.2*  --   --   --   --   ALKPHOS 59  --   --   --   --   ALT 8  --   --   --   --   AST 19  --   --   --   --   GLUCOSE 114*  < > 91 100* 123*  < > = values in this interval not displayed.-CK 198   Iron/TIBC/Ferritin/ %Sat    Component Value Date/Time   IRON 20* 02/10/2014 1555   TIBC 135* 02/10/2014 1555   FERRITIN 484* 02/10/2014 1555   IRONPCTSAT 15* 02/10/2014 1555   - CRP 8.3 - Lactic Acid 0.67 - ESR 135 - ASO 114 - Rheumatoid Factor 10 - C3 31 - C4 4 - ANA positive, speckled - dsDNA ab- 6210 - DNA-histone >8.0 - Lyme titer negative  Urinalysis    Component Value Date/Time   COLORURINE YELLOW 02/09/2014 2317   APPEARANCEUR CLOUDY* 02/09/2014 2317   LABSPEC 1.017 02/09/2014 2317   PHURINE 5.0 02/09/2014 2317   GLUCOSEU NEGATIVE 02/09/2014 2317   GLUCOSEU NEG mg/dL 08/16/2006 2308   HGBUR LARGE* 02/09/2014 2317   BILIRUBINUR NEGATIVE 02/09/2014 2317   KETONESUR NEGATIVE 02/09/2014 2317   PROTEINUR 100* 02/09/2014 2317   UROBILINOGEN 1.0 02/09/2014 2317   NITRITE NEGATIVE 02/09/2014 2317   LEUKOCYTESUR TRACE* 02/09/2014 2317   Imaging/Diagnostic Tests: Dg Chest 2 View (02/09/2014):  No acute disease.     US Renal (02/10/2014): Status post right nephrectomy.  Cholelithiasis  No acute abnormality is noted.   [place], [place] 02/14/2014, 8:50 AM PGY-1, [facility] Intern pager: [phone], text pages welcome

## 2014-02-14 NOTE — Plan of Care (Signed)
Problem: Phase I Progression Outcomes Goal: Hemodynamically stable Outcome: Completed/Met Date Met:  02/14/14  Problem: Phase II Progression Outcomes Goal: Voiding independently Outcome: Completed/Met Date Met:  02/14/14  Problem: Phase III Progression Outcomes Goal: Pain controlled on oral analgesia Outcome: Completed/Met Date Met:  02/14/14

## 2014-02-15 ENCOUNTER — Inpatient Hospital Stay (HOSPITAL_COMMUNITY): Payer: BC Managed Care – PPO

## 2014-02-15 DIAGNOSIS — R1030 Lower abdominal pain, unspecified: Secondary | ICD-10-CM

## 2014-02-15 DIAGNOSIS — R109 Unspecified abdominal pain: Secondary | ICD-10-CM | POA: Diagnosis present

## 2014-02-15 LAB — RENAL FUNCTION PANEL
Albumin: 1.6 g/dL — ABNORMAL LOW (ref 3.5–5.2)
Anion gap: 15 (ref 5–15)
BUN: 92 mg/dL — ABNORMAL HIGH (ref 6–23)
CALCIUM: 8.1 mg/dL — AB (ref 8.4–10.5)
CO2: 16 mEq/L — ABNORMAL LOW (ref 19–32)
CREATININE: 5.34 mg/dL — AB (ref 0.50–1.35)
Chloride: 108 mEq/L (ref 96–112)
GFR calc Af Amer: 13 mL/min — ABNORMAL LOW (ref >90)
GFR calc non Af Amer: 11 mL/min — ABNORMAL LOW (ref >90)
GLUCOSE: 117 mg/dL — AB (ref 70–99)
PHOSPHORUS: 7.1 mg/dL — AB (ref 2.3–4.6)
Potassium: 5.5 mEq/L — ABNORMAL HIGH (ref 3.7–5.3)
Sodium: 139 mEq/L (ref 137–147)

## 2014-02-15 LAB — CBC
HCT: 24.5 % — ABNORMAL LOW (ref 39.0–52.0)
HEMOGLOBIN: 8.4 g/dL — AB (ref 13.0–17.0)
MCH: 30.5 pg (ref 26.0–34.0)
MCHC: 34.3 g/dL (ref 30.0–36.0)
MCV: 89.1 fL (ref 78.0–100.0)
Platelets: 218 10*3/uL (ref 150–400)
RBC: 2.75 MIL/uL — ABNORMAL LOW (ref 4.22–5.81)
RDW: 14.4 % (ref 11.5–15.5)
WBC: 11.8 10*3/uL — ABNORMAL HIGH (ref 4.0–10.5)

## 2014-02-15 LAB — BODY FLUID CULTURE: Culture: NO GROWTH

## 2014-02-15 MED ORDER — SIMETHICONE 80 MG PO CHEW
80.0000 mg | CHEWABLE_TABLET | Freq: Four times a day (QID) | ORAL | Status: DC
  Administered 2014-02-15 – 2014-02-18 (×10): 80 mg via ORAL
  Filled 2014-02-15 (×14): qty 1

## 2014-02-15 NOTE — Progress Notes (Signed)
Patient ID: MAXENCE LAPRAIRIE, male    DOB: [birth year], 58 y.o.   MRN: [id number] Nephrology Progress Note  S: No complaints today, says joint pain much better. Has been walking in halls with family . No pain.  Had high K that responded to kaexylate, bicarb and one dose of lasix  O:BP 138/77 mmHg  Pulse 87  Temp(Src) 98.4 F (36.9 C) (Oral)  Resp 17  Ht 6\' 6"  (1.981 m)  Wt 69.854 kg (154 lb)  BMI 17.80 kg/m2  SpO2 100%  Intake/Output Summary (Last 24 hours) at 02/15/14 0943 Last data filed at 02/15/14 0404  Gross per 24 hour  Intake   1120 ml  Output   1170 ml  Net    -50 ml   Intake/Output: I/O last 3 completed shifts: In: 1600 [P.O.:1200; I.V.:400] Out: 1820 [Urine:1820]  Intake/Output this shift:    Weight change: -2.313 kg (-5 lb 1.6 oz) [id number] male, laying in bed in no distress [id number] rate and rhythm, no murmur Resp:Clear to auscultation bilaterally [id number], non-tender, non-distended Ext:LE  Trace edema in ankles   Recent Labs Lab 02/09/14 2258 02/10/14 0445 02/10/14 1030 02/11/14 0540 02/12/14 0514 02/13/14 0449 02/14/14 0601 02/14/14 1712 02/15/14 0415  NA 136* 137  --  138 138 139 136* 137 139  K 6.0* 5.2  --  5.8* 5.5* 5.7* 6.8* 5.9* 5.5*  CL 105 109  --  112 111 110 109 108 108  CO2 16* 15*  --  13* 14* 14* 12* 13* 16*  GLUCOSE 114* 117*  --  91 100* 123* 117* 145* 117*  BUN 87* 81*  --  74* 77* 80* 85* 90* 92*  CREATININE 6.28* 5.67* 5.29* 5.29* 5.36* 5.47* 5.21* 5.40* 5.34*  ALBUMIN 2.0* 1.6*  --   --  1.5* 1.6* 1.5*  --  1.6*  CALCIUM 8.4 7.6*  --  7.8* 8.0* 8.6 8.3* 8.3* 8.1*  PHOS  --  5.3*  --   --  5.3* 6.2* 6.6*  --  7.1*  AST 19  --   --   --   --   --   --   --   --   ALT 8  --   --   --   --   --   --   --   --    Liver Function Tests:  Recent Labs Lab 02/09/14 2258  02/13/14 0449 02/14/14 0601 02/15/14 0415  AST 19  --   --   --   --   ALT 8  --   --   --   --   ALKPHOS 59  --   --   --   --   BILITOT 0.2*  --   --   --    --   PROT 8.8*  --   --   --   --   ALBUMIN 2.0*  < > 1.6* 1.5* 1.6*  < > = values in this interval not displayed. No results for input(s): LIPASE, AMYLASE in the last 168 hours. No results for input(s): AMMONIA in the last 168 hours. CBC:  Recent Labs Lab 02/09/14 2258 02/10/14 0445 02/11/14 0540 02/12/14 0514 02/14/14 0740 02/15/14 0415  WBC 10.9* 8.3 9.4 10.0 8.6 11.8*  NEUTROABS 8.3*  --   --   --   --   --   HGB 9.7* 8.1* 8.0* 7.7* 8.5* 8.4*  HCT 28.4* 23.9* 23.6* 22.9* 25.2* 24.5*  MCV 88.8 88.8 88.4 90.2  89.4 89.1  PLT 207 181 177 176 222 218   Cardiac Enzymes:  Recent Labs Lab 02/09/14 2347  CKTOTAL 198   CBG: No results for input(s): GLUCAP in the last 168 hours.  Iron Studies:  No results for input(s): IRON, TIBC, TRANSFERRIN, FERRITIN in the last 72 hours.  Ref Range 1d ago    C3 Complement 90 - 180 mg/dL 31 (L)     Ref Range 1d ago    Complement C4, Body Fluid 10 - 40 mg/dL 4 (L)      Studies/Results: US Biopsy  02/13/2014   CLINICAL DATA:  Lupus nephritis  EXAM: ULTRASOUND GUIDED CORE BIOPSY OF LEFT KIDNEY CORTEX  MEDICATIONS: 1 mg IV Versed; 50 mcg IV Fentanyl  Total Moderate Sedation Time: 15  PROCEDURE: The procedure, risks, benefits, and alternatives were explained to the patient. Questions regarding the procedure were encouraged and answered. The patient understands and consents to the procedure.  The LEFT FLANK was prepped with BETADINE in a sterile fashion, and a sterile drape was applied covering the operative field. A sterile gown and sterile gloves were used for the procedure. Local anesthesia was provided with 1% Lidocaine.  Patient positioned prone. Preliminary ultrasound performed. Left kidney lower pole cortex was localized. Left kidney is noted to be diffusely echogenic compatible with medical renal disease. No hydronephrosis. Under sterile conditions and local anesthesia, ultrasound guided needle access was performed of the left kidney  lower pole cortex. 16 gauge core biopsy needle advanced to the cortex. 2 16 gauge core biopsies obtained. Samples placed in saline. Postprocedure imaging demonstrates no hemorrhage or hematoma.  COMPLICATIONS: None.  FINDINGS: Imaging confirms needle placement to the left kidney cortex for core biopsy  IMPRESSION: Successful ultrasound left renal cortex core biopsy.   Electronically Signed   By: Daryll Brod M.D.   On: 02/13/2014 10:37   . abacavir  600 mg Oral Daily  . amLODipine  10 mg Oral Daily  . atorvastatin  40 mg Oral q1800  . darbepoetin (ARANESP) injection - NON-DIALYSIS  100 mcg Subcutaneous Q Fri-1800  . dolutegravir  50 mg Oral BID  . feeding supplement (NEPRO CARB STEADY)  237 mL Oral BID BM  . heparin subcutaneous  5,000 Units Subcutaneous 3 times per day  . lamiVUDine  50 mg Oral Daily  . pantoprazole  20 mg Oral Daily  . polyethylene glycol  17 g Oral BID  . predniSONE  60 mg Oral Q breakfast  . sodium chloride  3 mL Intravenous Q12H    Assessment/Plan: [name] 58 y.o. 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  1. AKI: Baseline creatinine of 1.1-1.2. In August was 1.39. Creatinine on admission of 6.28. Did initially come down some then up- now stable in the 5.3-4.4 range. Patient taking good PO's, making good urine.  Renal ultrasound (11/10) significant for s/p right nephrectomy. 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  . Renal biopsy done Friday, results pending. Follow renal function 2. Follow-up biopsy results Continue prednisone 60mg  daily- not sure what significance of atypical P anca is as regular P and C anca were negative- I guess it just can be consistent with lupus.  Will continue just PO pred right now- await biopsy results for further treatment needs.  Patient is not uremic and there are no indications for dialysis at  this time. 2. Polyarthralgia: management per  primary. Appears to be working up for rheumatologic cause- - rheum factor negative 1. Continue Prednisone as above- possibly better  3. Normocytic anemia: baseline around 11-13. Down here.Iron panel today suggest anemia of chronic disease as well as being iron deficient. Received  iron, have added aranesp- is stable   4. Hematuria: microscopic.  Kidney biopsy will give definitive diagnosis and allow [place] to give him a more focused treatment plan if needed.   5. UTI: no growth on culture. abx stopped  6. Hyperkalemia: lingering- due to CKD and steroids- is on low K diet - also received kaexylate on 11/14- started bicarb which should help  7. HIV: on abacavir, dolutegravir and lamivudine. Recommend continuing therapy. Dolutegravir increased to BID by infectious disease. Primary team and infectious disease managing  8. Hypertension: on amlodipine. Continue per primary    Corliss Parish, MD 02/15/2014

## 2014-02-15 NOTE — Progress Notes (Signed)
Patient ambulated to the end of the hallway (down to room 6E01) and then walked back to his room. He walked with supervision only. He ambulated the same distance with his sister yesterday. Patient tolerated very well.  Joellen Jersey, RN.

## 2014-02-15 NOTE — Progress Notes (Signed)
Family Medicine Teaching Service Daily Progress Note Intern Pager: [phone]  Patient name: Kenneth [name] Medical record number: [id number] Date of birth: [birth year] Age: 58 y.o. Gender: male  Primary Care Provider: Cordelia Poche, MD Consultants: Nephrology Code Status: Full  Pt Overview and Major Events to Date:  02/10/14: Admitted to Family Medicine Teaching Service  Assessment and Plan:  Kenneth [name] is a 58 y.o. male presenting with AKI on CKD II, polyarthralgias, SIRS without clear source. 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  # AKI on CKD II: Cr 6.28 on admission (baseline ~1.2, recently 1.37 in 8/15). S/p R nephrectomy in 2011 for congenital hydronephrosis and non-functioning kidney. 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 - Creatinine 5.34 this morning - UA with 100 protein and 7-10 RBCs, concerning for nephritis of some type or nephrotic syndrome but no edema on exam.  - Nephrology consulted--appreciate recommendations  - Renal Biopsy--follow up results  - Bicarb 12 yesterday--Bicarb infusion started    -Trended up to 16 - ASO, RF negative - C3 of 31, C4 of 4--both are low - ANA positive, speckled - dsDNA ab- 6210 (high) - DNA histone ab- positive - Parathyroid hormone 124 (high) with normal to low calcium (8.6 today) - atypical pANCA positive, however regular P and C anca negative - IVF: NS @ 159m/hr - Renal UKorea no acute abnormality, cholithiasis - daily weights and strict Is&Os  - Intake: 1480; Output: 1515  # Abdominal Pain- located in RLQ and LLQ - F/U KUB  # Polyarthralgias: ~5wk history of joint pain primarily in knees/ankles/hands. ESR and CRP elevated to 135 and 8.3 respectively on 02/02/14.  - Lyme titer negative - Morphine 260mq2h prn for pain control - Consider outpatient Rheumatology referral - s/p R and L knee aspirations - Follow up  synovial fluid cell count with differential, crystal analysis, culture, and Gram stain  - Straw colored and cloudy, WBC 1025 (high) and Neutrophils 80 (high)  # Cough: Lungs CTAB, non-productive. Could be related to atelectasis - IS - Consider CXR if worsens or becomes productive or lung exam changes.  # Hyperkalemia: K 6.0 on admission. Mildly peaked T waves on EKG. - Potassium 6.8 yesterday. Kayexalate 30 given. Follow up potassium of 5.9. Kayexalate 15 given. - Potassium 5.5 this morning - Miralax 17g daily - Monitor on telemetry  # HIV disease: Recent CD4 count 390 and viral load <20 (11/11/13) - HAART restarted on 02/10/14 - Follow up viral load and CD4 count - ID consulted: T[place] increased to BID  # Normocytic Anemia: Hgb 9.7 on admission (baseline 11-12.5). Likely related to renal disease. - Hemoglobin 8.4 this morning - Iron Panel:  Iron 20, UIBC 115, TIBC 135, Ferritin 484  - Indicative of Anemia of Chronic Disease - Iron, Aranesp - Continue to monitor   # HTN: Currently well controlled - Continue home amlodipine 1089m Continue to monitor closely in setting of AKI and SIRS  FEN/GI: Renal diet, NS @ 125m48m Prophylaxis: subq heparin per pharm (for renal dosing)  Disposition: Dispo pending renal biopsy.  CIR evaluation.  Subjective:  No acute complaints overnight. Nurse states he complains of 7/10 abdominal pain on side of renal biopsy. When asked about this pain, Kenneth [name] it is 8/10 and waxes and wanes. It is located across his RLQ and LLQ and does not radiate up into the flank. States his joint pain is much  improved and he has been able to ambulate. No bowel movements today. No further complaints.  Objective: Temp:  [97.7 F (36.5 C)-98.6 F (37 C)] 98.4 F (36.9 C) (11/15 0404) Pulse Rate:  [85-100] 87 (11/15 0404) Resp:  [17-18] 17 (11/15 0404) BP: (138-161)/(77-95) 138/77 mmHg (11/15 0404) SpO2:  [98 %-100 %] 100 % (11/15 0404) Weight:  [154 lb  (69.854 kg)] 154 lb (69.854 kg) (11/14 2036) Physical Exam: General: 58yo male resting comfortably in no apparent distress Cardiovascular: S1 and S2 noted. No murmurs/rubs/gallops. Respiratory: Clear to auscultation bilaterally. No wheezing noted. No increased work of breathing. Abdomen: High pitched bowel sounds noted. Nontender to palpation. Extremities: No edema in ankles. Pulses palpable. No effusions of knee present  Laboratory:  Recent Labs Lab 02/11/14 0540 02/12/14 0514 02/14/14 0740  WBC 9.4 10.0 8.6  HGB 8.0* 7.7* 8.5*  HCT 23.6* 22.9* 25.2*  PLT 177 176 222    Recent Labs Lab 02/09/14 2258  02/13/14 0449 02/14/14 0601 02/14/14 1712  NA 136*  < > 139 136* 137  K 6.0*  < > 5.7* 6.8* 5.9*  CL 105  < > 110 109 108  CO2 16*  < > 14* 12* 13*  BUN 87*  < > 80* 85* 90*  CREATININE 6.28*  < > 5.47* 5.21* 5.40*  CALCIUM 8.4  < > 8.6 8.3* 8.3*  PROT 8.8*  --   --   --   --   BILITOT 0.2*  --   --   --   --   ALKPHOS 59  --   --   --   --   ALT 8  --   --   --   --   AST 19  --   --   --   --   GLUCOSE 114*  < > 123* 117* 145*  < > = values in this interval not displayed.-CK 198   Iron/TIBC/Ferritin/ %Sat    Component Value Date/Time   IRON 20* 02/10/2014 1555   TIBC 135* 02/10/2014 1555   FERRITIN 484* 02/10/2014 1555   IRONPCTSAT 15* 02/10/2014 1555   - CRP 8.3 - Lactic Acid 0.67 - ESR 135 - ASO 114 - Rheumatoid Factor 10 - C3 31 - C4 4 - ANA positive, speckled - dsDNA ab- 6210 - DNA-histone >8.0 - Lyme titer negative  Urinalysis    Component Value Date/Time   COLORURINE YELLOW 02/09/2014 2317   APPEARANCEUR CLOUDY* 02/09/2014 2317   LABSPEC 1.017 02/09/2014 2317   PHURINE 5.0 02/09/2014 2317   GLUCOSEU NEGATIVE 02/09/2014 2317   GLUCOSEU NEG mg/dL 08/16/2006 2308   HGBUR LARGE* 02/09/2014 2317   BILIRUBINUR NEGATIVE 02/09/2014 2317   KETONESUR NEGATIVE 02/09/2014 2317   PROTEINUR 100* 02/09/2014 2317   UROBILINOGEN 1.0 02/09/2014 2317    NITRITE NEGATIVE 02/09/2014 2317   LEUKOCYTESUR TRACE* 02/09/2014 2317   Imaging/Diagnostic Tests: Dg Chest 2 View (02/09/2014):  No acute disease.     US Renal (02/10/2014): Status post right nephrectomy.  Cholelithiasis  No acute abnormality is noted.   [place], [place] 02/15/2014, 5:00 AM PGY-1, [place] Intern pager: [phone], text pages welcome

## 2014-02-15 NOTE — Progress Notes (Signed)
Patient c/o 7/10 lower abdominal pain that radiates to his left flank area. Patient recently had a left renal biopsy done. Biopsy site looks normal. Patient given prn pain medications. Dr. Gerlean Ren notified. No new orders.   Joellen Jersey, RN.

## 2014-02-15 NOTE — Plan of Care (Signed)
Problem: Phase III Progression Outcomes Goal: Tolerating diet Outcome: Completed/Met Date Met:  02/15/14

## 2014-02-15 NOTE — Plan of Care (Signed)
Problem: Phase II Progression Outcomes Goal: Discharge plan established Outcome: Progressing  Problem: Phase III Progression Outcomes Goal: Activity at appropriate level-compared to baseline (UP IN CHAIR FOR HEMODIALYSIS)  Outcome: Completed/Met Date Met:  02/15/14 Goal: Afebrile, Vital Signs remain stable Outcome: Completed/Met Date Met:  02/15/14     

## 2014-02-16 LAB — CULTURE, BLOOD (ROUTINE X 2)
CULTURE: NO GROWTH
Culture: NO GROWTH

## 2014-02-16 LAB — RENAL FUNCTION PANEL
Albumin: 1.5 g/dL — ABNORMAL LOW (ref 3.5–5.2)
Anion gap: 15 (ref 5–15)
BUN: 91 mg/dL — AB (ref 6–23)
CO2: 19 meq/L (ref 19–32)
Calcium: 7.8 mg/dL — ABNORMAL LOW (ref 8.4–10.5)
Chloride: 106 mEq/L (ref 96–112)
Creatinine, Ser: 5.21 mg/dL — ABNORMAL HIGH (ref 0.50–1.35)
GFR calc non Af Amer: 11 mL/min — ABNORMAL LOW (ref >90)
GFR, EST AFRICAN AMERICAN: 13 mL/min — AB (ref >90)
Glucose, Bld: 103 mg/dL — ABNORMAL HIGH (ref 70–99)
Phosphorus: 7 mg/dL — ABNORMAL HIGH (ref 2.3–4.6)
Potassium: 4.3 mEq/L (ref 3.7–5.3)
Sodium: 140 mEq/L (ref 137–147)

## 2014-02-16 LAB — HIV-1 RNA QUANT-NO REFLEX-BLD: HIV-1 RNA Quant, Log: <1.3 {Log} (ref <1.30)

## 2014-02-16 MED ORDER — SENNA 8.6 MG PO TABS
1.0000 | ORAL_TABLET | Freq: Every day | ORAL | Status: DC
  Administered 2014-02-16 – 2014-02-18 (×3): 8.6 mg via ORAL
  Filled 2014-02-16 (×3): qty 1

## 2014-02-16 MED ORDER — SODIUM BICARBONATE 650 MG PO TABS
650.0000 mg | ORAL_TABLET | Freq: Every day | ORAL | Status: DC
  Administered 2014-02-16 – 2014-02-18 (×3): 650 mg via ORAL
  Filled 2014-02-16 (×3): qty 1

## 2014-02-16 NOTE — Progress Notes (Signed)
NUTRITION FOLLOW UP  Pt meets criteria for SEVERE MALNUTRITION in the context of chronic illness as evidenced by a 7.6% weight loss in one month, energy intake </= 75% for >/= 1 month, and severe muscle mass depletion.  DOCUMENTATION CODES Per approved criteria  -Severe malnutrition in the context of chronic illness -Underweight   INTERVENTION: Continue Nepro Shake po BID, each supplement provides 425 kcal and 19 grams protein.  Encourage adequate PO intake.  NUTRITION DIAGNOSIS: Increased nutrient needs related to acute kidney injury as evidenced by estimated nutrition needs; ongoing  Goal: Pt to meet >/= 90% of their estimated nutrition needs; met  Monitor:  PO intake, weight trends, labs, I/O's  58 y.o. male  Admitting Dx: AKI (acute kidney injury)  ASSESSMENT: Pt presenting with AKI on CKD II, polyarthralgias, SIRS without clear source. 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   Renal function has been improving without any acute dialysis needed, per MD note. Current awaiting renal biopsy. Pt reports his appetite is good. Meal completion is 25-100%, however most meal completions have been >/=75%. Pt reports he has been drinking his Nepro Shakes. Will continue with current orders. Pt was encouraged to continue to eat his food at meals and to drink his supplements.  Labs: Low calcium, and GFR. High BUN, creatinine, phosphorous (7.0).  Height: Ht Readings from Last 1 Encounters:  02/10/14 6' 6"  (1.981 m)    Weight: Wt Readings from Last 1 Encounters:  02/15/14 158 lb (71.668 kg)    BMI:  Body mass index is 18.26 kg/(m^2). Underweight  Re-Estimated Nutritional Needs: Kcal: 2200-2400 Protein: 100-115 grams Fluid: 1.2 L/day  Skin: Incision left lower back, non-pitting LE edema  Diet Order: Diet renal W/1246m fluid restriction   Intake/Output Summary (Last 24 hours) at 02/16/14 1624 Last  data filed at 02/16/14 0700  Gross per 24 hour  Intake 1064.17 ml  Output    975 ml  Net  89.17 ml    Last BM: 11/15  Labs:   Recent Labs Lab 02/14/14 0601 02/14/14 1712 02/15/14 0415 02/16/14 0538  NA 136* 137 139 140  K 6.8* 5.9* 5.5* 4.3  CL 109 108 108 106  CO2 12* 13* 16* 19  BUN 85* 90* 92* 91*  CREATININE 5.21* 5.40* 5.34* 5.21*  CALCIUM 8.3* 8.3* 8.1* 7.8*  PHOS 6.6*  --  7.1* 7.0*  GLUCOSE 117* 145* 117* 103*    CBG (last 3)  No results for input(s): GLUCAP in the last 72 hours.  Scheduled Meds: . abacavir  600 mg Oral Daily  . amLODipine  10 mg Oral Daily  . atorvastatin  40 mg Oral q1800  . darbepoetin (ARANESP) injection - NON-DIALYSIS  100 mcg Subcutaneous Q Fri-1800  . dolutegravir  50 mg Oral BID  . feeding supplement (NEPRO CARB STEADY)  237 mL Oral BID BM  . heparin subcutaneous  5,000 Units Subcutaneous 3 times per day  . lamiVUDine  50 mg Oral Daily  . pantoprazole  20 mg Oral Daily  . polyethylene glycol  17 g Oral BID  . predniSONE  60 mg Oral Q breakfast  . senna  1 tablet Oral Daily  . simethicone  80 mg Oral QID  . sodium bicarbonate  650 mg Oral Daily  . sodium chloride  3 mL Intravenous Q12H    Continuous Infusions:    Past Medical History  Diagnosis Date  . HIV infection   .  Hyperlipidemia   . Hypertension   . Insomnia   . Dysphagia   . Achalasia   . Renal abscess     Stage II kidney disease  . Hemorrhoids, internal   . Arthritis     Past Surgical History  Procedure Laterality Date  . Nephrectomy      Right    Kallie Locks, MS, RD, LDN Pager # [phone] After hours/ weekend pager # [phone]

## 2014-02-16 NOTE — Progress Notes (Signed)
Occupational Therapy Treatment Patient Details Name: Kenneth [name] MRN: [id number] DOB: Oct 23, 1955 Today's Date: 02/16/2014    History of present illness 58 y.o. male presenting with AKI on CKD II, polyarthralgias, SIRS without clear source. 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 Pt had fluid drawn from R knee 02/11/14 with improvement in symptoms. To have fluid taken from L knee this afternoon (02/12/14) per RN.Awaiting renal biopsy to r/o Lupus.    OT comments  Pt has made significant progress with mobility and ADL. Feel pt can D/C home with HHOT. Pt does not have family in area and will rely on coworkers to assist as needed. Pt expressed concerns over his ability to return to work and driving -recommended for pt to discuss with MD. Will continue to follow to reach mod I level goals. Encouraged pt to walk with staff.  Follow Up Recommendations  Home health OT;Supervision - Intermittent    Equipment Recommendations  None recommended by OT    Recommendations for Other Services      Precautions / Restrictions Precautions Precautions: Fall       Mobility Bed Mobility    mod I              Transfers Overall transfer level: Needs assistance Equipment used: None Transfers: Sit to/from Stand;Stand Pivot Transfers Sit to Stand: Supervision Stand pivot transfers: Supervision            Balance Overall balance assessment: Needs assistance   Sitting balance-Leahy Scale: Normal       Standing balance-Leahy Scale: Good                     ADL Overall ADL's : Needs assistance/impaired     Grooming: Supervision/safety   Upper Body Bathing: Supervision/ safety;Set up   Lower Body Bathing: Supervison/ safety;Set up;Sit to/from stand   Upper Body Dressing : Set up;Sitting   Lower Body Dressing: Supervision/safety;Set up;Sit to/from stand   Toilet Transfer:  Supervision/safety;Ambulation;Comfort height toilet   Toileting- Clothing Manipulation and Hygiene: Modified independent;Sit to/from stand       Functional mobility during ADLs: Supervision/safety General ADL Comments: Significant improvement with ADL. REc for pt to purchase reacher to assist with IADL tasks.      Vision                     Perception     Praxis      Cognition   Behavior During Therapy: WFL for tasks assessed/performed Overall Cognitive Status: No family/caregiver present to determine baseline cognitive functioning                  General Comments: Slower processing time; however, did not observe difference from intial eval    Extremity/Trunk Assessment               Exercises Other Exercises Other Exercises: BUE theraband strengthening exercises - level 3   Shoulder Instructions       General Comments      Pertinent Vitals/ Pain       Pain Assessment: No/denies pain  Home Living        lives alone. Limited support                                  Prior Functioning/Environment  Frequency Min 2X/week     Progress Toward Goals  OT Goals(current goals can now be found in the care plan section)  Progress towards OT goals: Progressing toward goals  Acute Rehab OT Goals Patient Stated Goal: Home independently OT Goal Formulation: With patient Time For Goal Achievement: 02/24/14 Potential to Achieve Goals: Good ADL Goals Pt Will Perform Lower Body Bathing: with modified independence;sit to/from stand;with adaptive equipment Pt Will Perform Lower Body Dressing: with modified independence;with adaptive equipment;sit to/from stand Pt Will Transfer to Toilet: with modified independence;ambulating;bedside commode Pt Will Perform Toileting - Clothing Manipulation and hygiene: with modified independence;sit to/from stand Pt Will Perform Tub/Shower Transfer: Shower transfer;with modified  independence;ambulating;shower seat;rolling walker  Plan Discharge plan needs to be updated    Co-evaluation                 End of Session Equipment Utilized During Treatment: Gait belt   Activity Tolerance Patient tolerated treatment well   Patient Left in chair;with call bell/phone within reach   Nurse Communication Mobility status;Other (comment) (pt's concern regarding return to work)        Time: [id number] OT Time Calculation (min): 26 min  Charges: OT General Charges $OT Visit: 1 Procedure OT Treatments $Self Care/Home Management : 23-37 mins  Pariss Hommes,HILLARY 02/16/2014, 4:02 PM   [facility], OTR/L  [phone] 02/16/2014

## 2014-02-16 NOTE — Progress Notes (Signed)
Patient ID: Kenneth [name], male   DOB: November 16, 1955, 58 y.o.   MRN: [id number]  [facility] Progress Note    Assessment/ Plan:   1. Acute renal failure: Non-oliguric and with initial evidence pointing towards a nephritic syndrome-was bleeding with lupus given low complement levels and atypical p-ANCA with positive anti-nuclear antibody/anti-double-stranded DNA. Currently on corticosteroids as renal biopsy is pending. Renal function sluggishly improving without any acute dialysis needs. If lupus confirmed on renal biopsy, will decide on need to choose CellCept versus cyclophosphamide for treatment. 2. Polyarthralgia: Ongoing pain control/management with corticosteroids-may indeed be associated with lupus. 3. Anemia of chronic disease: Status post intravenous iron therapy and dose of ES a. 4. HIV infection: Ongoing close evaluation and management by infectious disease 5. Hypertension: Fairly controlled blood pressures on monotherapy with amlodipine.  Subjective:   Reports a significant improvement of his joint pain and was able to ambulate the hallways with moderate assistance   Objective:   BP 137/76 mmHg  Pulse 83  Temp(Src) 98.7 F (37.1 C) (Oral)  Resp 20  Ht 6\' 6"  (1.981 m)  Wt 71.668 kg (158 lb)  BMI 18.26 kg/m2  SpO2 100%  Intake/Output Summary (Last 24 hours) at 02/16/14 1140 Last data filed at 02/16/14 0700  Gross per 24 hour  Intake   1290 ml  Output   1275 ml  Net     15 ml   Weight change: 1.814 kg (4 lb)  Physical Exam: [id number] resting up in a chair [id number] regular in rate and rhythm, S1 and S2 normal Resp:clear to auscultation bilaterally, no distinct rales/rhonchi [id number], flat, nontender and bowel sounds normal Ext:no lower extremity edema appreciated  Imaging: Dg Abd 1 View  02/15/2014   CLINICAL DATA:  Abdominal pain for 2 days.  ICD10: R 10.9  EXAM: ABDOMEN - 1 VIEW  COMPARISON:  Abdominal ultrasound 02/10/2014  FINDINGS: Single  supine view of the abdomen and pelvis. Surgical clips in the left upper quadrant. Gas within normal caliber colon. No small bowel dilatation. Distal gas identified. No gross free intraperitoneal air or pneumatosis. No abnormal abdominal calcifications. No appendicolith.  IMPRESSION: No acute findings.   Electronically Signed   By: Abigail Miyamoto M.D.   On: 02/15/2014 16:36    Labs: BMET  Recent Labs Lab 02/10/14 0445  02/11/14 0540 02/12/14 0514 02/13/14 0449 02/14/14 0601 02/14/14 1712 02/15/14 0415 02/16/14 0538  NA 137  --  138 138 139 136* 137 139 140  K 5.2  --  5.8* 5.5* 5.7* 6.8* 5.9* 5.5* 4.3  CL 109  --  112 111 110 109 108 108 106  CO2 15*  --  13* 14* 14* 12* 13* 16* 19  GLUCOSE 117*  --  91 100* 123* 117* 145* 117* 103*  BUN 81*  --  74* 77* 80* 85* 90* 92* 91*  CREATININE 5.67*  < > 5.29* 5.36* 5.47* 5.21* 5.40* 5.34* 5.21*  CALCIUM 7.6*  --  7.8* 8.0* 8.6 8.3* 8.3* 8.1* 7.8*  PHOS 5.3*  --   --  5.3* 6.2* 6.6*  --  7.1* 7.0*  < > = values in this interval not displayed. CBC  Recent Labs Lab 02/09/14 2258  02/11/14 0540 02/12/14 0514 02/14/14 0740 02/15/14 0415  WBC 10.9*  < > 9.4 10.0 8.6 11.8*  NEUTROABS 8.3*  --   --   --   --   --   HGB 9.7*  < > 8.0* 7.7* 8.5* 8.4*  HCT 28.4*  < >  23.6* 22.9* 25.2* 24.5*  MCV 88.8  < > 88.4 90.2 89.4 89.1  PLT 207  < > 177 176 222 218  < > = values in this interval not displayed.  Medications:    . abacavir  600 mg Oral Daily  . amLODipine  10 mg Oral Daily  . atorvastatin  40 mg Oral q1800  . darbepoetin (ARANESP) injection - NON-DIALYSIS  100 mcg Subcutaneous Q Fri-1800  . dolutegravir  50 mg Oral BID  . feeding supplement (NEPRO CARB STEADY)  237 mL Oral BID BM  . heparin subcutaneous  5,000 Units Subcutaneous 3 times per day  . lamiVUDine  50 mg Oral Daily  . pantoprazole  20 mg Oral Daily  . polyethylene glycol  17 g Oral BID  . predniSONE  60 mg Oral Q breakfast  . simethicone  80 mg Oral QID  . sodium  chloride  3 mL Intravenous Q12H   Elmarie Shiley, MD 02/16/2014, 11:40 AM

## 2014-02-16 NOTE — Progress Notes (Signed)
Family Medicine Teaching Service Daily Progress Note Intern Pager: [phone]  Patient name: Kenneth [name] Medical record number: [id number] Date of birth: 1955-12-02 Age: 58 y.o. Gender: male  Primary Care Provider: Cordelia Poche, MD Consultants: Nephrology Code Status: Full  Pt Overview and Major Events to Date:  02/10/14: Admitted to Family Medicine Teaching Service  Assessment and Plan:  Kenneth [name] is a 58 y.o. male presenting with AKI on CKD II, polyarthralgias, SIRS without clear source. 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  # AKI on CKD II: Cr 6.28 on admission (baseline ~1.2, recently 1.37 in 8/15). S/p R nephrectomy in 2011 for congenital hydronephrosis and non-functioning kidney. 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 - Creatinine 4.3 this morning - UA with 100 protein and 7-10 RBCs, concerning for nephritis of some type or nephrotic syndrome but no edema on exam.  - Nephrology consulted--appreciate recommendations  - Renal Biopsy--follow up results  - Bicarb infusion  - ASO, RF negative - C3 of 31, C4 of 4--both are low - ANA positive, speckled - dsDNA ab- 6210 (high) - DNA histone ab- positive - Parathyroid hormone 124 (high) with normal to low calcium (8.6 today) - atypical pANCA positive, however regular P and C anca negative - IVF: NS @ 111m/hr - Renal UKorea no acute abnormality, cholithiasis - daily weights and strict Is&Os  - Intake: 1480; Output: 1515  # Abdominal Pain- located in RLQ and LLQ. Improved since yesterday. - KUB- no acute findings - Simethicone 815mfour times daily - Currently on Miralax 17g BID. Will add Sennakot today and consider enema if no improvement. - Continue to monitor  # Polyarthralgias: ~5wk history of joint pain primarily in knees/ankles/hands. ESR and CRP elevated to 135 and 8.3 respectively on 02/02/14.  - Lyme titer  negative - Morphine 19m46m2h prn for pain control - Consider outpatient Rheumatology referral - s/p R and L knee aspirations - Follow up synovial fluid cell count with differential, crystal analysis, culture, and Gram stain  - Straw colored and cloudy, WBC 1025 (high) and Neutrophils 80 (high) - Began ambulating on 02/14/14 - CIR Evaluated on 02/16/14: no longer qualifies for CIR. Recommend home with home health PT  # Cough: Lungs CTAB, non-productive. Could be related to atelectasis - IS - Consider CXR if worsens or becomes productive or lung exam changes.  # Hyperkalemia: K 6.0 on admission. Mildly peaked T waves on EKG. - Potassium 4.3 this morning, down from 5.5 yesterday and peak of 6.8 on 11/14 - Miralax 17g daily - Monitor on telemetry  # HIV disease: Recent CD4 count 390 and viral load <20 (11/11/13) - HAART restarted on 02/10/14 - Follow up viral load and CD4 count - ID consulted: Tivicay increased to BID  # Normocytic Anemia: Hgb 9.7 on admission (baseline 11-12.5). Likely related to renal disease. - Iron Panel:  Iron 20, UIBC 115, TIBC 135, Ferritin 484  - Indicative of Anemia of Chronic Disease - Iron, Aranesp - Continue to monitor   FEN/GI: Renal diet, NS @ 125m77m Prophylaxis: subq heparin per pharm (for renal dosing)  Disposition: Dispo pending renal biopsy.  Home with home PT  Subjective:  No acute complaints overnight. Continues to note abdominal pain, however states it is improved to 5/10 from 8/10 yesterday. No bowel movements for two days and no flatus today. Cough has resolved. Joint pain has improved and he has been ambulating  well. No further complaints today.  Objective: Temp:  [97.7 F (36.5 C)-99.3 F (37.4 C)] 99.3 F (37.4 C) (11/15 2041) Pulse Rate:  [80-84] 83 (11/15 2041) Resp:  [18-19] 19 (11/15 2041) BP: (126-142)/(74-82) 142/82 mmHg (11/15 2041) SpO2:  [98 %-100 %] 98 % (11/15 2041) Weight:  [158 lb (71.668 kg)] 158 lb (71.668 kg) (11/15  2041) Physical Exam: General: 58yo male resting comfortably in no apparent distress Cardiovascular: S1 and S2 noted. No murmurs/rubs/gallops. Respiratory: Clear to auscultation bilaterally. No wheezing noted. No increased work of breathing. Abdomen: High pitched bowel sounds noted. Nontender to palpation. Extremities: No edema in ankles. Pulses palpable. No effusions of knee present  Laboratory:  Recent Labs Lab 02/12/14 0514 02/14/14 0740 02/15/14 0415  WBC 10.0 8.6 11.8*  HGB 7.7* 8.5* 8.4*  HCT 22.9* 25.2* 24.5*  PLT 176 222 218    Recent Labs Lab 02/09/14 2258  02/14/14 0601 02/14/14 1712 02/15/14 0415  NA 136*  < > 136* 137 139  K 6.0*  < > 6.8* 5.9* 5.5*  CL 105  < > 109 108 108  CO2 16*  < > 12* 13* 16*  BUN 87*  < > 85* 90* 92*  CREATININE 6.28*  < > 5.21* 5.40* 5.34*  CALCIUM 8.4  < > 8.3* 8.3* 8.1*  PROT 8.8*  --   --   --   --   BILITOT 0.2*  --   --   --   --   ALKPHOS 59  --   --   --   --   ALT 8  --   --   --   --   AST 19  --   --   --   --   GLUCOSE 114*  < > 117* 145* 117*  < > = values in this interval not displayed.-CK 198   Iron/TIBC/Ferritin/ %Sat    Component Value Date/Time   IRON 20* 02/10/2014 1555   TIBC 135* 02/10/2014 1555   FERRITIN 484* 02/10/2014 1555   IRONPCTSAT 15* 02/10/2014 1555   - CRP 8.3 - Lactic Acid 0.67 - ESR 135 - ASO 114 - Rheumatoid Factor 10 - C3 31 - C4 4 - ANA positive, speckled - dsDNA ab- 6210 - DNA-histone >8.0 - Lyme titer negative  Urinalysis    Component Value Date/Time   COLORURINE YELLOW 02/09/2014 2317   APPEARANCEUR CLOUDY* 02/09/2014 2317   LABSPEC 1.017 02/09/2014 2317   PHURINE 5.0 02/09/2014 2317   GLUCOSEU NEGATIVE 02/09/2014 2317   GLUCOSEU NEG mg/dL 08/16/2006 2308   HGBUR LARGE* 02/09/2014 2317   BILIRUBINUR NEGATIVE 02/09/2014 2317   KETONESUR NEGATIVE 02/09/2014 2317   PROTEINUR 100* 02/09/2014 2317   UROBILINOGEN 1.0 02/09/2014 2317   NITRITE NEGATIVE 02/09/2014 2317    LEUKOCYTESUR TRACE* 02/09/2014 2317   Imaging/Diagnostic Tests: Dg Chest 2 View (02/09/2014):  No acute disease.     US Renal (02/10/2014): Status post right nephrectomy.  Cholelithiasis  No acute abnormality is noted.   [place], [place] 02/16/2014, 4:27 AM PGY-1, [place] Intern pager: [phone], text pages welcome

## 2014-02-16 NOTE — Progress Notes (Signed)
Rehab admissions - I have been following pt's case and noted pt's progress over the weekend (pt had been ambulating in the halls with family and supervision). I spoke with PT after therapy today and PT is now recommending home with home health. Pt is performing mobility at a supervision level.  I met with pt to share the recommendation for home with home health support as pt no longer needs the intensity of inpatient rehab. In addition, pt has BCBS and they would not give authorization for inpatient rehab with this current high activity level.  Pt understood the recommendation for home with home health and stated friends/family could also offer him some support if needed.   We will now sign off pt's case. I updated Malachy Mood, Tourist information centre manager as well. Please call me with any questions. Thanks.  Nanetta Batty, PT Rehabilitation Admissions Coordinator [phone]

## 2014-02-16 NOTE — Progress Notes (Signed)
Physical Therapy Treatment Patient Details Name: Kenneth [name] MRN: [id number] DOB: September 19, 1955 Today's Date: 02/16/2014    History of Present Illness 58 y.o. male presenting with AKI on CKD II, polyarthralgias, SIRS without clear source. 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 Pt had fluid drawn from R knee 02/11/14 with improvement in symptoms. To have fluid taken from L knee this afternoon (02/12/14) per RN.    PT Comments    Pt showing good functional improvement this session, and goals were met and updated. Pt completed a DGI with a score of 20. Pt ambulated a total of 720 feet this session with overall supervision and no AD. Pt reports his bilateral knee pain has resolved, however continues to feel "weak in his knees". This was evident mainly on the stairs, as pt with difficulty controlling descent from one step to the next. Recommended pt utilize step-to pattern for safety for now. D/C plan updated to reflect current status, however somewhat concerned about what appeared to be delayed processing during session. Pt states he has friends/neighbors he can call on for assist at home if needed.  Follow Up Recommendations  Home health PT     Equipment Recommendations  None recommended by PT    Recommendations for Other Services       Precautions / Restrictions Precautions Precautions: Fall Precaution Comments: B knee pain - now resolved per pt Restrictions Weight Bearing Restrictions: No    Mobility  Bed Mobility Overal bed mobility: Needs Assistance Bed Mobility: Supine to Sit Rolling: Supervision         General bed mobility comments: No physical assist required. Supervision for safety.   Transfers Overall transfer level: Needs assistance Equipment used: None Transfers: Sit to/from Stand Sit to Stand: Supervision         General transfer comment: No physical assist required. Pt was able to  power-up to full standing with increased time and supervision for safety.   Ambulation/Gait Ambulation/Gait assistance: Supervision Ambulation Distance (Feet): 720 Feet Assistive device: None (Pushed IV pole at times) Gait Pattern/deviations: WFL(Within Functional Limits) Gait velocity: Decreased Gait velocity interpretation: Below normal speed for age/gender General Gait Details: Pt was able to improve gait speed with cueing. No assistance for balance throughout gait training. Pt with more "normal" gait pattern when NOT pushing the IV pole.    Stairs Stairs: Yes Stairs assistance: Supervision Stair Management: One rail Left;Forwards Number of Stairs: 4 General stair comments: Pt able to ascend stairs well with no difficulty. Descending stairs, pt with somewhat uncontrolled step down to next stair. Pt did not require assistance, however. Pt states there was no pain during this time, just that his knees felt "weak". Advised to go step-to gait pattern on stairs for safety.   Wheelchair Mobility    Modified Rankin (Stroke Patients Only)       Balance Overall balance assessment: Needs assistance Sitting-balance support: Feet supported;No upper extremity supported Sitting balance-Leahy Scale: Good     Standing balance support: No upper extremity supported;During functional activity Standing balance-Leahy Scale: Good                      Cognition Arousal/Alertness: Awake/alert Behavior During Therapy: WFL for tasks assessed/performed Overall Cognitive Status: Impaired/Different from baseline Area of Impairment: Awareness;Problem solving;Following commands       Following Commands: Follows multi-step commands with increased time   Awareness: Emergent Problem Solving: Slow processing;Requires verbal cues General  Comments: Pt appeared to have slow processing this session. Showed difficulty anticipating needs (needs to pull the chair out from the sink prior to sitting  in it. Instead, just stands and stares at the chair). Asked pt same question many times with very vague response. Required very direct line of questioning to get appropriate details.     Exercises      General Comments        Pertinent Vitals/Pain Pain Assessment: No/denies pain    Home Living                      Prior Function            PT Goals (current goals can now be found in the care plan section) Acute Rehab PT Goals Patient Stated Goal: Home independently PT Goal Formulation: With patient Time For Goal Achievement: 02/24/14 Potential to Achieve Goals: Good Progress towards PT goals: Goals met and updated - see care plan    Frequency  Min 3X/week    PT Plan Discharge plan needs to be updated    Co-evaluation             End of Session Equipment Utilized During Treatment: Gait belt Activity Tolerance: Patient tolerated treatment well Patient left: in chair;Other (comment);with call bell/phone within reach (Sitting at sink to get washed up for the day. )     Time: 4584-8350 PT Time Calculation (min) (ACUTE ONLY): 30 min  Charges:  $Gait Training: 8-22 mins $Therapeutic Activity: 8-22 mins                    G Codes:      Rolinda Roan 2014/02/18, 11:49 AM  Rolinda Roan, PT, DPT Acute Rehabilitation Services Pager: [phone]

## 2014-02-17 LAB — RENAL FUNCTION PANEL
ALBUMIN: 1.6 g/dL — AB (ref 3.5–5.2)
ANION GAP: 15 (ref 5–15)
BUN: 89 mg/dL — AB (ref 6–23)
CHLORIDE: 103 meq/L (ref 96–112)
CO2: 21 meq/L (ref 19–32)
Calcium: 7.9 mg/dL — ABNORMAL LOW (ref 8.4–10.5)
Creatinine, Ser: 4.74 mg/dL — ABNORMAL HIGH (ref 0.50–1.35)
GFR calc Af Amer: 15 mL/min — ABNORMAL LOW (ref >90)
GFR calc non Af Amer: 13 mL/min — ABNORMAL LOW (ref >90)
Glucose, Bld: 98 mg/dL (ref 70–99)
POTASSIUM: 4.1 meq/L (ref 3.7–5.3)
Phosphorus: 5.7 mg/dL — ABNORMAL HIGH (ref 2.3–4.6)
Sodium: 139 mEq/L (ref 137–147)

## 2014-02-17 NOTE — Progress Notes (Signed)
Physical Therapy Treatment Patient Details Name: Kenneth [name] MRN: [id number] DOB: [birth year] Today's Date: 02/17/2014    History of Present Illness 58 y.o. male presenting with AKI on CKD II, polyarthralgias, SIRS without clear source. 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 Pt had fluid drawn from R knee 02/11/14 with improvement in symptoms. To have fluid taken from L knee this afternoon (02/12/14) per RN.Awaiting renal biopsy to r/o Lupus.     PT Comments    Pt progressing well towards physical therapy goals. No longer feel that pt needs immediate PT follow up at d/c. If pt desires, outpatient PT could be beneficial for further balance training. Focus of session was HEP review, posture, and safety awareness with functional mobility. Pt was not limited by pain at all throughout session.   Follow Up Recommendations  No PT follow up (outpatient PT if pt desires)     Equipment Recommendations  None recommended by PT    Recommendations for Other Services       Precautions / Restrictions Precautions Precautions: Fall Precaution Comments: B knee pain - now resolved per pt Restrictions Weight Bearing Restrictions: No    Mobility  Bed Mobility Overal bed mobility: Modified Independent Bed Mobility: Supine to Sit              Transfers Overall transfer level: Modified independent Equipment used: None Transfers: Sit to/from Stand              Ambulation/Gait Ambulation/Gait assistance: Modified independent (Device/Increase time) Ambulation Distance (Feet): 300 Feet Assistive device: None Gait Pattern/deviations: WFL(Within Functional Limits) Gait velocity: Decreased Gait velocity interpretation: Below normal speed for age/gender General Gait Details: No unsteadiness/balance deficits noted.    Stairs            Wheelchair Mobility    Modified Rankin (Stroke Patients Only)        Balance Overall balance assessment: No apparent balance deficits (not formally assessed)                                  Cognition Arousal/Alertness: Awake/alert Behavior During Therapy: WFL for tasks assessed/performed Overall Cognitive Status: No family/caregiver present to determine baseline cognitive functioning                 General Comments: Slower processing time; however, did not observe difference from intial eval    Exercises General Exercises - Lower Extremity Long Arc Quad: 15 reps Hip ABduction/ADduction: Standing;10 reps Toe Raises: Standing;10 reps Mini-Sqauts: Standing;10 reps Other Exercises Other Exercises: Standing hip extension x10 Other Exercises: Standing hip extension with knee bent Other Exercises: Picking up items off the floor - practice per pt request x2 Other Exercises: Step-ups in stairwell x10 each leg    General Comments        Pertinent Vitals/Pain Pain Assessment: No/denies pain    Home Living                      Prior Function            PT Goals (current goals can now be found in the care plan section) Acute Rehab PT Goals Patient Stated Goal: Home independently PT Goal Formulation: With patient Time For Goal Achievement: 02/24/14 Potential to Achieve Goals: Good Progress towards PT goals: Progressing toward goals    Frequency  Min 3X/week  PT Plan Discharge plan needs to be updated    Co-evaluation             End of Session Equipment Utilized During Treatment: Gait belt Activity Tolerance: Patient tolerated treatment well Patient left: in chair;with call bell/phone within reach     Time: 1114-1150 PT Time Calculation (min) (ACUTE ONLY): 36 min  Charges:  $Gait Training: 8-22 mins $Therapeutic Exercise: 8-22 mins                    G Codes:      Rolinda Roan 07-Mar-2014, 1:15 PM   Rolinda Roan, PT, DPT Acute Rehabilitation Services Pager: [phone]

## 2014-02-17 NOTE — Progress Notes (Signed)
Patient ID: Kenneth [name], male   DOB: 06-29-1955, 58 y.o.   MRN: [id number]  [facility] Progress Note    Assessment/ Plan:   1. 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 Currently on corticosteroids as renal biopsy is pending. Renal function sluggishly improving without any acute dialysis needs. Definitive treatment/management will be based on tissue diagnosis/renal biopsy findings 2. Polyarthralgia: Ongoing pain control/management with corticosteroids-may indeed be associated with lupus. 3. Anemia of chronic disease: Status post intravenous iron therapy and dose of ESA. 4. HIV infection: Ongoing close evaluation and management by infectious disease 5. Hypertension: Fairly controlled blood pressures on monotherapy with amlodipine.  Subjective:   Reports to be feeling well-tolerable arthralgia. Possible discharge to inpatient rehabilitation versus SNF.   Objective:   BP 138/88 mmHg  Pulse 79  Temp(Src) 97.9 F (36.6 C) (Oral)  Resp 21  Ht 6\' 6"  (1.981 m)  Wt 71.668 kg (158 lb)  BMI 18.26 kg/m2  SpO2 100%  Intake/Output Summary (Last 24 hours) at 02/17/14 1037 Last data filed at 02/17/14 0943  Gross per 24 hour  Intake    480 ml  Output   1450 ml  Net   -970 ml   Weight change: 0 kg (0 lb)  Physical Exam: [id number] resting in bed [id number] regular rate and rhythm, S1 and S2 normal Resp:clear to auscultation bilaterally, no rales/rhonchi [id number], flat, nontender Ext:no lower extremity edema  Imaging: Dg Abd 1 View  02/15/2014   CLINICAL DATA:  Abdominal pain for 2 days.  ICD10: R 10.9  EXAM: ABDOMEN - 1 VIEW  COMPARISON:  Abdominal ultrasound 02/10/2014  FINDINGS: Single supine view of the abdomen and pelvis. Surgical clips in the left upper quadrant. Gas within normal caliber colon. No small bowel dilatation. Distal gas identified. No gross free intraperitoneal air or  pneumatosis. No abnormal abdominal calcifications. No appendicolith.  IMPRESSION: No acute findings.   Electronically Signed   By: Abigail Miyamoto M.D.   On: 02/15/2014 16:36    Labs: BMET  Recent Labs Lab 02/12/14 0514 02/13/14 0449 02/14/14 0601 02/14/14 1712 02/15/14 0415 02/16/14 0538 02/17/14 0652  NA 138 139 136* 137 139 140 139  K 5.5* 5.7* 6.8* 5.9* 5.5* 4.3 4.1  CL 111 110 109 108 108 106 103  CO2 14* 14* 12* 13* 16* 19 21  GLUCOSE 100* 123* 117* 145* 117* 103* 98  BUN 77* 80* 85* 90* 92* 91* 89*  CREATININE 5.36* 5.47* 5.21* 5.40* 5.34* 5.21* 4.74*  CALCIUM 8.0* 8.6 8.3* 8.3* 8.1* 7.8* 7.9*  PHOS 5.3* 6.2* 6.6*  --  7.1* 7.0* 5.7*   CBC  Recent Labs Lab 02/11/14 0540 02/12/14 0514 02/14/14 0740 02/15/14 0415  WBC 9.4 10.0 8.6 11.8*  HGB 8.0* 7.7* 8.5* 8.4*  HCT 23.6* 22.9* 25.2* 24.5*  MCV 88.4 90.2 89.4 89.1  PLT 177 176 222 218    Medications:    . abacavir  600 mg Oral Daily  . amLODipine  10 mg Oral Daily  . atorvastatin  40 mg Oral q1800  . darbepoetin (ARANESP) injection - NON-DIALYSIS  100 mcg Subcutaneous Q Fri-1800  . dolutegravir  50 mg Oral BID  . feeding supplement (NEPRO CARB STEADY)  237 mL Oral BID BM  . heparin subcutaneous  5,000 Units Subcutaneous 3 times per day  . lamiVUDine  50 mg Oral Daily  . pantoprazole  20 mg Oral Daily  . polyethylene glycol  17 g Oral  BID  . predniSONE  60 mg Oral Q breakfast  . senna  1 tablet Oral Daily  . simethicone  80 mg Oral QID  . sodium bicarbonate  650 mg Oral Daily  . sodium chloride  3 mL Intravenous Q12H   Elmarie Shiley, MD 02/17/2014, 10:37 AM

## 2014-02-17 NOTE — Progress Notes (Signed)
Family Medicine Teaching Service Daily Progress Note Intern Pager: [phone]  Patient name: Kenneth [name] Medical record number: [id number] Date of birth: [birth year] Age: 58 y.o. Gender: male  Primary Care Provider: Cordelia Poche, MD Consultants: Nephrology Code Status: Full  Pt Overview and Major Events to Date:  02/10/14: Admitted to Family Medicine Teaching Service  Assessment and Plan:  DERIUS GHOSH is a 58 y.o. male presenting with AKI on CKD II, polyarthralgias, SIRS without clear source. 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  # AKI on CKD II: Cr 6.28 on admission (baseline ~1.2, recently 1.37 in 8/15). S/p R nephrectomy in 2011 for congenital hydronephrosis and non-functioning kidney. 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 - Creatinine 4.74 this morning, down from 5.21 yesterday - UA with 100 protein and 7-10 RBCs, concerning for nephritis of some type or nephrotic syndrome but no edema on exam.  - Nephrology consulted--appreciate recommendations  - Renal Biopsy--follow up results  - If lupus confirmed with bx, consider CellCept vs. 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 Improved since yesterday. - KUB- no acute findings - Simethicone 840mfour times daily - Currently on Miralax 17g BID. Will add Sennakot today and  consider enema if no improvement. - Continue to monitor  # Polyarthralgias: ~5wk history of joint pain primarily in knees/ankles/hands. ESR and CRP elevated to 135 and 8.3 respectively on 02/02/14.  - Lyme titer negative - Morphine 70m20m2h prn for pain control - Consider outpatient Rheumatology referral - s/p R and L knee aspirations - Follow up synovial fluid cell count with differential, crystal analysis, culture, and Gram stain  - Straw colored and cloudy, WBC 1025 (high) and Neutrophils 80 (high) - Began ambulating on 02/14/14 - CIR Evaluated on 02/16/14: no longer qualifies for CIR. Recommend home with home health PT  # Hyperkalemia: K 6.0 on admission. Mildly peaked T waves on EKG. - Potassium 4.1 this morning, down from peak of 6.8 on 11/14 - Miralax 17g daily - Monitor on telemetry  # HIV disease: Recent CD4 count 390 and viral load <20 (11/11/13) - HAART restarted on 02/10/14 - Follow up viral load and CD4 count - ID consulted: Tivicay increased to BID  FEN/GI: Renal diet, NS @ 125m48m Prophylaxis: subq heparin per pharm (for renal dosing)  Disposition: Dispo pending renal biopsy.  Home with home PT  Subjective:  No acute complaints overnight. Denies abdominal pain and cough today. Has been ambulating well. No further complaints today.   Objective: Temp:  [97.8 F (36.6 C)-98.7 F (37.1 C)] 97.8 F (36.6 C) (11/17 0602) Pulse Rate:  [78-84] 78 (11/17 0602) Resp:  [20] 20 (11/17 0602) BP: (127-144)/(76-90) 127/82 mmHg (11/17 0602) SpO2:  [99 %-100 %] 99 % (11/17 0602) Weight:  [158 lb (71.668 kg)] 158 lb (71.668 kg) (11/16 2121) Physical Exam: General: 58yo23yoe sitting comfortably  on side of bed and eating breakfast in no apparent distress Cardiovascular: S1 and S2 noted. No murmurs/rubs/gallops. Respiratory: Clear to auscultation bilaterally. No wheezing noted. No increased work of breathing. Abdomen: High pitched bowel sounds noted. Nontender to  palpation. Extremities: No edema in ankles. Pulses palpable. No effusions of knee present  Laboratory:  Recent Labs Lab 02/12/14 0514 02/14/14 0740 02/15/14 0415  WBC 10.0 8.6 11.8*  HGB 7.7* 8.5* 8.4*  HCT 22.9* 25.2* 24.5*  PLT 176 222 218    Recent Labs Lab 02/15/14 0415 02/16/14 0538 02/17/14 0652  NA 139 140 139  K 5.5* 4.3 4.1  CL 108 106 103  CO2 16* 19 21  BUN 92* 91* 89*  CREATININE 5.34* 5.21* 4.74*  CALCIUM 8.1* 7.8* 7.9*  GLUCOSE 117* 103* 98  -CK 198  Iron/TIBC/Ferritin/ %Sat    Component Value Date/Time   IRON 20* 02/10/2014 1555   TIBC 135* 02/10/2014 1555   FERRITIN 484* 02/10/2014 1555   IRONPCTSAT 15* 02/10/2014 1555   - CRP 8.3 - Lactic Acid 0.67 - ESR 135 - ASO 114 - Rheumatoid Factor 10 - C3 31 - C4 4 - ANA positive, speckled - dsDNA ab- 6210 - DNA-histone >8.0 - Lyme titer negative  Urinalysis    Component Value Date/Time   COLORURINE YELLOW 02/09/2014 2317   APPEARANCEUR CLOUDY* 02/09/2014 2317   LABSPEC 1.017 02/09/2014 2317   PHURINE 5.0 02/09/2014 2317   GLUCOSEU NEGATIVE 02/09/2014 2317   GLUCOSEU NEG mg/dL 08/16/2006 2308   HGBUR LARGE* 02/09/2014 2317   BILIRUBINUR NEGATIVE 02/09/2014 2317   KETONESUR NEGATIVE 02/09/2014 2317   PROTEINUR 100* 02/09/2014 2317   UROBILINOGEN 1.0 02/09/2014 2317   NITRITE NEGATIVE 02/09/2014 2317   LEUKOCYTESUR TRACE* 02/09/2014 2317   Imaging/Diagnostic Tests: Dg Chest 2 View (02/09/2014):  No acute disease.     US Renal (02/10/2014): Status post right nephrectomy.  Cholelithiasis  No acute abnormality is noted.   [place], [place] 02/17/2014, 8:47 AM PGY-1, [place] Intern pager: [phone], text pages welcome

## 2014-02-18 LAB — RENAL FUNCTION PANEL
Albumin: 1.5 g/dL — ABNORMAL LOW (ref 3.5–5.2)
Anion gap: 13 (ref 5–15)
BUN: 88 mg/dL — ABNORMAL HIGH (ref 6–23)
CO2: 20 mEq/L (ref 19–32)
Calcium: 7.6 mg/dL — ABNORMAL LOW (ref 8.4–10.5)
Chloride: 105 mEq/L (ref 96–112)
Creatinine, Ser: 4.65 mg/dL — ABNORMAL HIGH (ref 0.50–1.35)
GFR, EST AFRICAN AMERICAN: 15 mL/min — AB (ref >90)
GFR, EST NON AFRICAN AMERICAN: 13 mL/min — AB (ref >90)
Glucose, Bld: 112 mg/dL — ABNORMAL HIGH (ref 70–99)
Phosphorus: 5.1 mg/dL — ABNORMAL HIGH (ref 2.3–4.6)
Potassium: 4.3 mEq/L (ref 3.7–5.3)
SODIUM: 138 meq/L (ref 137–147)

## 2014-02-18 MED ORDER — ABACAVIR SULFATE 300 MG PO TABS: 600.0000 mg | ORAL_TABLET | Freq: Every day | ORAL | Status: DC

## 2014-02-18 MED ORDER — PREDNISONE 20 MG PO TABS: 60.0000 mg | ORAL_TABLET | Freq: Every day | ORAL | Status: DC

## 2014-02-18 MED ORDER — ATORVASTATIN CALCIUM 40 MG PO TABS: 40.0000 mg | ORAL_TABLET | Freq: Every day | ORAL | Status: DC

## 2014-02-18 MED ORDER — DOLUTEGRAVIR SODIUM 50 MG PO TABS: 50.0000 mg | ORAL_TABLET | Freq: Two times a day (BID) | ORAL | Status: DC

## 2014-02-18 MED ORDER — LAMIVUDINE 10 MG/ML PO SOLN: 50.0000 mg | Freq: Every day | ORAL | Status: DC

## 2014-02-18 NOTE — Care Management Note (Signed)
CARE MANAGEMENT NOTE 02/18/2014  Patient:  TIMMEY, LAMBA   Account Number:  [id number]  Date Initiated:  02/18/2014  Documentation initiated by:  Sharlynn Seckinger  Subjective/Objective Assessment:   CM following for progression and d/c planning.     Action/Plan:   02/18/2014 Met with pt and offered [place] and DME, however this pt declined these services.   Anticipated DC Date:  02/18/2014   Anticipated DC Plan:  HOME/SELF CARE         Choice offered to / List presented to:             Status of service:  Completed, signed off Medicare Important Message given?  NO (If response is "NO", the following Medicare IM given date fields will be blank) Date Medicare IM given:   Medicare IM given by:   Date Additional Medicare IM given:   Additional Medicare IM given by:    Discharge Disposition:  HOME/SELF CARE  Per UR Regulation:    If discussed at Long Length of Stay Meetings, dates discussed:    Comments:  02/18/2014 Pt politely declined [place] services and DME.   Pt advised to all his PCP if he should find after d/c that he needs this assistance.  CRoyal RN MPH, case manager, [phone]

## 2014-02-18 NOTE — Plan of Care (Signed)
Problem: Discharge Progression Outcomes Goal: Discharge plan in place and appropriate Outcome: Completed/Met Date Met:  02/18/14 Goal: Pain controlled with appropriate interventions Outcome: Completed/Met Date Met:  02/18/14 Goal: Tolerating diet Outcome: Completed/Met Date Met:  02/18/14 Goal: Activity appropriate for discharge plan Outcome: Completed/Met Date Met:  02/18/14 Goal: Temperature < 100 x 24 hrs on PO antibiotics Outcome: Completed/Met Date Met:  02/18/14

## 2014-02-18 NOTE — Discharge Instructions (Signed)
I am so glad you are feeling better! Several of your tests indicate that Lupus is the most likely cause of your symptoms, however thi diagnosis has not been confirmed yet. 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 We will let you know when the final biopsy results come back!  Please continue to take your Prednisone, which is a steroid that will help decrease the inflammation. Because of your kidneys, you will need to separate out Abacavir, Dolutegravir, and Lamivudine instead of taking a pill that combines all of them.  Please follow up with your family medicine doctor, Dr. Lonny Prude, on 03/05/14. Please follow up with Dr. Moshe Cipro, who is the kidney doctor you have seen here in the hospital, on 03/17/14.   Lupus Lupus (also called systemic lupus erythematosus, SLE) is a disorder of the body's natural defense system (immune system). In lupus, the immune system attacks various areas of the body (autoimmune disease). CAUSES The cause is unknown. However, lupus runs in families. Certain genes can make you more likely to develop lupus. It is 10 times more common in women than in men. Lupus is also more common in African Americans and Asians. Other factors also play a role, such as viruses (Epstein-Barr virus, EBV), stress, hormones, cigarette smoke, and certain drugs. SYMPTOMS Lupus can affect many parts of the body, including the joints, skin, kidneys, lungs, heart, nervous system, and blood vessels. The signs and symptoms of lupus differ from person to person. The disease can range from mild to life-threatening. Typical features of lupus include:  Butterfly-shaped rash over the face.  Arthritis involving one or more joints.  Kidney disease.  Fever, weight loss, hair loss, fatigue.  Poor circulation in the fingers and toes (Raynaud's disease).  Chest pain when taking deep breaths. Abdominal pain  may also occur.  Skin rash in areas exposed to the sun.  Sores in the mouth and nose. DIAGNOSIS Diagnosing lupus can take a long time and is often difficult. An exam and an accurate account of your symptoms and health problems is very important. Blood tests are necessary, though no single test can confirm or rule out lupus. Most people with lupus test positive for antinuclear antibodies (ANA) on a blood test. Additional blood tests, a urine test (urinalysis), and sometimes a kidney or skin tissue sample (biopsy) can help to confirm or rule out lupus. TREATMENT There is no cure for lupus. Your caregiver will develop a treatment plan based on your age, sex, health, symptoms, and lifestyle. The goals are to prevent flares, to treat them when they do occur, and to minimize organ damage and complications. How the disease may affect each person varies widely. Most people with lupus can live normal lives, but this disorder must be carefully monitored. Treatment must be adjusted as necessary to prevent serious complications. Medicines used for treatment:  Nonsteroidal anti-inflammatory drugs (NSAIDs) decrease inflammation and can help with chest pain, joint pain, and fevers. Examples include ibuprofen and naproxen.  Antimalarial drugs were designed to treat malaria. They also treat fatigue, joint pain, skin rashes, and inflammation of the lungs in patients with lupus.  Corticosteroids are powerful hormones that rapidly suppress inflammation. The lowest dose with the highest benefit will be chosen. They can be given by cream, pills, injections, and through the vein (intravenously).  Immunosuppressive drugs block the making of immune cells. They may be used for kidney or nerve disease. HOME CARE  INSTRUCTIONS  Exercise. Low-impact activities can usually help keep joints flexible without being too strenuous.  Rest after periods of exercise.  Avoid excessive sun exposure.  Follow proper nutrition and  take supplements as recommended by your caregiver.  Stress management can be helpful. SEEK MEDICAL CARE IF:  You have increased fatigue.  You develop pain.  You develop a rash.  You have an oral temperature above 102 F (38.9 C).  You develop abdominal discomfort.  You develop a headache.  You experience dizziness. [place] of Neurological Disorders and Stroke: [employer] of Rheumatology: www.rheumatology.[facility] of Arthritis and Musculoskeletal and Skin Diseases: www.niams.[employer] Document Released: 03/10/2002 Document Revised: 06/12/2011 Document Reviewed: 07/01/2009 [facility] Patient Information 2015 [place], [place]. This information is not intended to replace advice given to you by your health care provider. Make sure you discuss any questions you have with your health care provider.

## 2014-02-18 NOTE — Plan of Care (Signed)
Problem: Phase III Progression Outcomes Goal: IV Medications to PO Outcome: Completed/Met Date Met:  02/18/14 Goal: Discharge plan remains appropriate-arrangements made Outcome: Completed/Met Date Met:  02/18/14

## 2014-02-18 NOTE — Progress Notes (Signed)
Patient ID: REYDEN BALDERSTON, male   DOB: 29-Nov-1955, 58 y.o.   MRN: [id number]  [facility] Progress Note   Assessment/ Plan:   1. Acute renal failure in a patient with a solitary left kidney: Non-oliguric and with preliminary labs suggestive of lupus/lupus-like syndrome-atypical ANCA positive. Renal function sluggishly improving without any acute dialysis needs. 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 We'll leave him on prednisone 60 mg daily and decide on further course of management as final biopsy results become available. 2. Polyarthralgia: reports improvement of his arthralgias/mobility following initiation of prednisone 3. Anemia of chronic disease: Status post intravenous iron therapy and dose of ESA. 4. HIV infection: Ongoing close evaluation and management by infectious disease 5. Hypertension: Fairly controlled blood pressures on monotherapy with amlodipine  If there are no further needs for him to remain hospitalized, he can be discharged today and follow up with renal-Dr. Moshe Cipro on 03/17/14 at 3 PM  Subjective:   Reports to be feeling well, anxious regarding his renal disposition.   Objective:   BP 136/81 mmHg  Pulse 70  Temp(Src) 97.8 F (36.6 C) (Oral)  Resp 20  Ht 6\' 6"  (1.981 m)  Wt 71.668 kg (158 lb)  BMI 18.26 kg/m2  SpO2 98%  Intake/Output Summary (Last 24 hours) at 02/18/14 1009 Last data filed at 02/18/14 0524  Gross per 24 hour  Intake    480 ml  Output   1775 ml  Net  -1295 ml   Weight change:   Physical Exam: [id number] resting in bed [id number] regular in rate and rhythm, S1 and S2 normal Resp:clear to auscultation, no rales [id number], flat, nontender Ext:no lower extremity edema  Imaging: No results found.  Labs: BMET  Recent Labs Lab 02/12/14 0514 02/13/14 0449 02/14/14 0601 02/14/14 1712 02/15/14 0415 02/16/14 0538 02/17/14 0652 02/18/14 0615  NA 138 139  136* 137 139 140 139 138  K 5.5* 5.7* 6.8* 5.9* 5.5* 4.3 4.1 4.3  CL 111 110 109 108 108 106 103 105  CO2 14* 14* 12* 13* 16* 19 21 20   GLUCOSE 100* 123* 117* 145* 117* 103* 98 112*  BUN 77* 80* 85* 90* 92* 91* 89* 88*  CREATININE 5.36* 5.47* 5.21* 5.40* 5.34* 5.21* 4.74* 4.65*  CALCIUM 8.0* 8.6 8.3* 8.3* 8.1* 7.8* 7.9* 7.6*  PHOS 5.3* 6.2* 6.6*  --  7.1* 7.0* 5.7* 5.1*   CBC  Recent Labs Lab 02/12/14 0514 02/14/14 0740 02/15/14 0415  WBC 10.0 8.6 11.8*  HGB 7.7* 8.5* 8.4*  HCT 22.9* 25.2* 24.5*  MCV 90.2 89.4 89.1  PLT 176 222 218    Medications:    . abacavir  600 mg Oral Daily  . amLODipine  10 mg Oral Daily  . atorvastatin  40 mg Oral q1800  . darbepoetin (ARANESP) injection - NON-DIALYSIS  100 mcg Subcutaneous Q Fri-1800  . dolutegravir  50 mg Oral BID  . feeding supplement (NEPRO CARB STEADY)  237 mL Oral BID BM  . heparin subcutaneous  5,000 Units Subcutaneous 3 times per day  . lamiVUDine  50 mg Oral Daily  . pantoprazole  20 mg Oral Daily  . polyethylene glycol  17 g Oral BID  . predniSONE  60 mg Oral Q breakfast  . senna  1 tablet Oral Daily  . simethicone  80 mg Oral QID  . sodium bicarbonate  650 mg Oral Daily  . sodium chloride  3 mL Intravenous Q12H   Elmarie Shiley,  MD 02/18/2014, 10:09 AM

## 2014-02-18 NOTE — Progress Notes (Signed)
Family Medicine Teaching Service Daily Progress Note Intern Pager: [phone]  Patient name: Kenneth [name] Medical record number: [id number] Date of birth: [birth year] Age: 58 y.o. Gender: male  Primary Care Provider: Cordelia Poche, MD Consultants: Nephrology Code Status: Full  Pt Overview and Major Events to Date:  02/10/14: Admitted to Family Medicine Teaching Service  Assessment and Plan:  Kenneth [name] is a 57 y.o. male presenting with AKI on CKD II, polyarthralgias, SIRS without clear source. 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  # AKI on CKD II: Cr 6.28 on admission (baseline ~1.2, recently 1.37 in 8/15). S/p R nephrectomy in 2011 for congenital hydronephrosis and non-functioning kidney. 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 - Creatinine 4.3 this morning - UA with 100 protein and 7-10 RBCs, concerning for nephritis of some type or nephrotic syndrome but no edema on exam.  - Nephrology consulted--appreciate recommendations  - Renal Biopsy--follow up results  - If lupus confirmed with bx, consider CellCept vs. Cyclophosphamide  - Contact Nephrology concerning disposition--waiting for renal biopsy results before discharge - Transitioned to oral bicarb on 11/16 - ASO, RF negative - C3 of 31, C4 of 4--both are low - ANA positive, speckled - dsDNA ab- 6210 (high) - DNA histone ab- positive - Parathyroid hormone 124 (high) with normal to low calcium (8.6 today) - atypical pANCA positive, however regular P and C anca negative - IVF: NS @ 161m/hr - Renal UKorea no acute abnormality, cholithiasis - daily weights and strict Is&Os  - Intake: 1480; Output: 1515  # HIV disease: Recent CD4 count 390 and viral load <20 (11/11/13) - HAART restarted on 02/10/14 - Follow up viral load and CD4 count - ID consulted: Tivicay increased to BID  FEN/GI: Renal diet, NS @  1266mhr Prophylaxis: subq heparin per pharm (for renal dosing)  Disposition: Dispo pending renal biopsy.  Home with home PT  Subjective:  No acute complaints overnight. Denies abdominal pain and cough. Continuing to ambulate well. No further complaints today.  Objective: Temp:  [97.8 F (36.6 C)-98.3 F (36.8 C)] 97.8 F (36.6 C) (11/18 0523) Pulse Rate:  [70-79] 70 (11/18 0523) Resp:  [20-22] 20 (11/18 0523) BP: (122-138)/(73-88) 136/81 mmHg (11/18 0523) SpO2:  [98 %-100 %] 98 % (11/18 0523) Physical Exam: General: [age]yoale sitting comfortably on side of bed and eating breakfast in no apparent distress Cardiovascular: S1 and S2 noted. No murmurs/rubs/gallops. Respiratory: Clear to auscultation bilaterally. No wheezing noted. No increased work of breathing. Abdomen: High pitched bowel sounds noted. Nontender to palpation. Extremities: No edema in ankles. Pulses palpable. No effusions of knee present  Laboratory:  Recent Labs Lab 02/12/14 0514 02/14/14 0740 02/15/14 0415  WBC 10.0 8.6 11.8*  HGB 7.7* 8.5* 8.4*  HCT 22.9* 25.2* 24.5*  PLT 176 222 218    Recent Labs Lab 02/16/14 0538 02/17/14 0652 02/18/14 0615  NA 140 139 138  K 4.3 4.1 4.3  CL 106 103 105  CO2 19 21 20   BUN 91* 89* 88*  CREATININE 5.21* 4.74* 4.65*  CALCIUM 7.8* 7.9* 7.6*  GLUCOSE 103* 98 112*  -CK 198  Iron/TIBC/Ferritin/ %Sat    Component Value Date/Time   IRON 20* 02/10/2014 1555   TIBC 135* 02/10/2014 1555   FERRITIN 484* 02/10/2014 1555   IRONPCTSAT 15* 02/10/2014 1555   - CRP 8.3 - Lactic Acid 0.67 - ESR 135 - ASO 114 - Rheumatoid Factor  10 - C3 31 - C4 4 - ANA positive, speckled - dsDNA ab- 6210 - DNA-histone >8.0 - Lyme titer negative  Urinalysis    Component Value Date/Time   COLORURINE YELLOW 02/09/2014 2317   APPEARANCEUR CLOUDY* 02/09/2014 2317   LABSPEC 1.017 02/09/2014 2317   PHURINE 5.0 02/09/2014 2317   GLUCOSEU NEGATIVE 02/09/2014 2317   GLUCOSEU NEG  mg/dL 08/16/2006 2308   HGBUR LARGE* 02/09/2014 2317   BILIRUBINUR NEGATIVE 02/09/2014 2317   KETONESUR NEGATIVE 02/09/2014 2317   PROTEINUR 100* 02/09/2014 2317   UROBILINOGEN 1.0 02/09/2014 2317   NITRITE NEGATIVE 02/09/2014 2317   LEUKOCYTESUR TRACE* 02/09/2014 2317   Imaging/Diagnostic Tests: Dg Chest 2 View (02/09/2014):  No acute disease.     US Renal (02/10/2014): Status post right nephrectomy.  Cholelithiasis  No acute abnormality is noted.   [place], [place] 02/18/2014, 8:40 AM PGY-1, [place] Intern pager: [phone], text pages welcome

## 2014-02-18 NOTE — Plan of Care (Signed)
Problem: Phase II Progression Outcomes Goal: Discharge plan established Outcome: Completed/Met Date Met:  02/18/14

## 2014-02-18 NOTE — Progress Notes (Signed)
Pt given discharge instruction pertaining to activity level, diet, follow up appointments, and new medications. Informed of where prescriptions were sent. Pt given time to ask questions and verbalized understanding of all instruction. IV was dc'd without complication. I escorted pt in stable condition out to family member's car. Tyna Jaksch, Therapist, sports.

## 2014-02-18 NOTE — Discharge Summary (Signed)
[facility] Discharge Summary  Patient name: Kenneth [name] Medical record number: [id number] Date of birth: 02-Nov-1955 Age: 58 y.o. Gender: male Date of Admission: 02/09/2014  Date of Discharge: 02/18/14 Admitting Physician: Lupita Dawn, MD  Primary Care Provider: Cordelia Poche, MD Consultants: Nephrology  Indication for Hospitalization: Acute Kidney Injury with Polyarthralgias  Discharge Diagnoses/Problem List:  Acute Kidney Injury Nephritis (Suspected to be secondary to lupus, but unconfirmed at discharge) Anemia of Chronic Disease Polyarthralgia HIV HTN Hyperkalemia  Disposition: Discharge Home  Discharge Condition: Stable  Brief Hospital Course:  Mr. Herzberg presented to the ED on 02/10/14 with progressively worsening pain in his knees, ankles, and hands for five weeks. Creatinine at admission was noted to be 6.28, up from his baseline of 1.2.  CK at admission was 198. Urinalysis showed 100 protein and 7-10 RBC.  He was initially started on Doxycycline 112m BID for possible Lyme disease and Lyme titers were drawn. Blood cultures showed no growth. C3 and C4 were both low at 31 and 4 respectively. ANA was positive and speckled. ASO was negative. Rheumatoid Factor was negative. dsDNA ab was high at 6210. DNA histone ab was positive. Renal ultrasound showed no acute abnormality, but did show signs of cholelithiasis.   Nephrology was consulted and recommended continuation of IV fluids. Recommended discussion of case with Rheumatology. Rheumatology recommended initiation of Prednisone. Prednisone was initiated at 224m however this dose was increased to 6044mNephrology also recommended ANCA, which showed positive atypical p-ANCA. 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  Potassium was noted to be 6.0 at admission with mildly peaked T waves on EKG. Potassium peaked at 6.8 on 11/14 and kayexylate was given. Miralax  was also used throughout hospitalization to help lower potassium and treat constipation. Bicarb was also noted to be low during hospitalization and bicarb infusion was started. Mr. ParHailess transitioned to oral bicarbonate on 11/16.  Upon initial presentation, Mr. ParJeansonned significant effusion of both knees. He was unable to ambulate well secondary to pain.  Knee aspiration was done on the right knee on 02/11/14 with relief of pain. Fluid showed wbc 1025 and neutrophils of 80, both of which were high. Left knee was aspirated on 02/12/14 following relief of pain with first aspiration.   Mr. ParFredericksso has a history of HIV and was taking Abacavir-Dolutegravir-Lamiudine 600-50-300m15m admission. Infectious Disease was consulted to determine renal dosing of antiretrovirals and to evaluate dose of Prednisone in an immunocompromised patient. ID recommended changing Dolutegravir dosing to 50mg34m. Recommended giving dose of prednisone required for kidney recovery since HIV is currently suppressed.  Mr. ParkeLevenedischarged on 02/18/14 following improvement of creatinine to 4.65 and resolution of polyarthralgias.  Issues for Follow Up:  - Follow up Renal Biopsy. If positive consider CellCept vs. Cyclophosphamide - Rheumatology referral - Check BMP for Creatinine, Potassium, and Bicarbonate - HAART medications renally dosed. Discharged on Abacavir 600mg,25mutegravir 50mg B32mand Lamivudine 50mg  S60mficant Procedures: Renal Biopsy  Significant Labs and Imaging:   Recent Labs Lab 02/12/14 0514 02/14/14 0740 02/15/14 0415  WBC 10.0 8.6 11.8*  HGB 7.7* 8.5* 8.4*  HCT 22.9* 25.2* 24.5*  PLT 176 222 218    Recent Labs Lab 02/14/14 0601 02/14/14 1712 02/15/14 0415 02/16/14 0538 02/17/14 0652 02/18/14 0615  NA 136* 137 139 140 139 138  K 6.8* 5.9* 5.5* 4.3 4.1 4.3  CL 109 108 108 106 103 105  CO2  12* 13* 16* _0 GLUCOSE 117* 145* 117* 103* 98 112*  BUN 85* 90* 92* 91* 89*  88*  CREATININE 5.21* 5.40* 5.34* 5.21* 4.74* 4.65*  CALCIUM 8.3* 8.3* 8.1* 7.8* 7.9* 7.6*  PHOS 6.6*  --  7.1* 7.0* 5.7* 5.1*  ALBUMIN 1.5*  --  1.6* 1.5* 1.6* 1.5*   Urinalysis    Component Value Date/Time   COLORURINE YELLOW 02/09/2014 2317   APPEARANCEUR CLOUDY* 02/09/2014 2317   LABSPEC 1.017 02/09/2014 2317   PHURINE 5.0 02/09/2014 2317   GLUCOSEU NEGATIVE 02/09/2014 2317   GLUCOSEU NEG mg/dL 08/16/2006 2308   HGBUR LARGE* 02/09/2014 2317   BILIRUBINUR NEGATIVE 02/09/2014 2317   KETONESUR NEGATIVE 02/09/2014 2317   PROTEINUR 100* 02/09/2014 2317   UROBILINOGEN 1.0 02/09/2014 2317   NITRITE NEGATIVE 02/09/2014 2317   LEUKOCYTESUR TRACE* 02/09/2014 2317   Iron/TIBC/Ferritin/ %Sat    Component Value Date/Time   IRON 20* 02/10/2014 1555   TIBC 135* 02/10/2014 1555   FERRITIN 484* 02/10/2014 1555   IRONPCTSAT 15* 02/10/2014 1555  - CK 198 - CRP 8.3 - Lactic Acid 0.67 - ESR 135 - ASO 114 - Rheumatoid Factor 10 - C3 31 - C4 4 - ANA positive, speckled - dsDNA ab- 6210 - DNA-histone >8.0 - Lyme titer negative - PTH 124 - cANCA negative - pANCA negative - Atypical pANCA positive - Atypical pANCA titer 1:640 - CD4 240 - HIV Viral Load <20 Results/Tests Pending at Time of Discharge: Renal Biopsy  Discharge Medications:    Medication List    STOP taking these medications        Abacavir-Dolutegravir-Lamivud 600-50-300 MG Tabs      TAKE these medications        abacavir 300 MG tablet  Commonly known as:  ZIAGEN  Take 2 tablets (600 mg total) by mouth daily.     acetaminophen 500 MG tablet  Commonly known as:  TYLENOL  Take 500 mg by mouth every 6 (six) hours as needed for mild pain.     amLODipine 10 MG tablet  Commonly known as:  NORVASC  Take 1 tablet (10 mg total) by mouth daily.     atorvastatin 40 MG tablet  Commonly known as:  LIPITOR  Take 1 tablet (40 mg total) by mouth daily.     cyclobenzaprine 5 MG tablet  Commonly known as:  FLEXERIL   Take 5 mg by mouth 3 (three) times daily as needed for muscle spasms.     dolutegravir 50 MG tablet  Commonly known as:  TIVICAY  Take 1 tablet (50 mg total) by mouth 2 (two) times daily.     lamiVUDine 10 MG/ML solution  Commonly known as:  EPIVIR  Take 5 mLs (50 mg total) by mouth daily.     pantoprazole 20 MG tablet  Commonly known as:  PROTONIX  Take 1 tablet (20 mg total) by mouth daily.     predniSONE 20 MG tablet  Commonly known as:  DELTASONE  Take 3 tablets (60 mg total) by mouth daily with breakfast.     traMADol 50 MG tablet  Commonly known as:  ULTRAM  Take 1 tablet (50 mg total) by mouth every 8 (eight) hours as needed.       Discharge Instructions: Please refer to Patient Instructions section of EMR for full details.  Patient was counseled important signs and symptoms that should prompt return to medical care, changes in medications, dietary instructions, activity restrictions, and follow up appointments.  Follow-Up Appointments:     Follow-up Information    Follow up with Louis Meckel, MD On 03/17/2014.   Specialty:  Nephrology   Why:  appointment at 3 PM. You will be scheduled for labs 1 week before that   Contact information:   [place] [address] [phone]       Follow up with Cordelia Poche, MD On 03/05/2014.   Specialty:  Family Medicine   Why:  _0 :45am for Hospital Follow up   Contact information:   [place] [address] [phone]      [place] N Rumley, DO 02/18/2014, 9:34 PM PGY-1, [place]

## 2014-03-03 ENCOUNTER — Encounter (HOSPITAL_COMMUNITY): Payer: Self-pay

## 2014-03-03 DIAGNOSIS — Z9289 Personal history of other medical treatment: Secondary | ICD-10-CM

## 2014-03-03 HISTORY — DX: Personal history of other medical treatment: Z92.89

## 2014-03-05 ENCOUNTER — Ambulatory Visit (INDEPENDENT_AMBULATORY_CARE_PROVIDER_SITE_OTHER): Payer: BC Managed Care – PPO | Admitting: Family Medicine

## 2014-03-05 ENCOUNTER — Encounter: Payer: Self-pay | Admitting: Family Medicine

## 2014-03-05 ENCOUNTER — Ambulatory Visit (HOSPITAL_COMMUNITY)
Admission: RE | Admit: 2014-03-05 | Discharge: 2014-03-05 | Disposition: A | Discharge status: Home / Self Care | Payer: BC Managed Care – PPO | Source: Ambulatory Visit | Attending: Family Medicine | Admitting: Family Medicine

## 2014-03-05 VITALS — BP 150/76 | HR 74 | Temp 97.7°F | Ht 78.0 in | Wt 146.0 lb

## 2014-03-05 DIAGNOSIS — N179 Acute kidney failure, unspecified: Secondary | ICD-10-CM

## 2014-03-05 DIAGNOSIS — K219 Gastro-esophageal reflux disease without esophagitis: Secondary | ICD-10-CM | POA: Diagnosis not present

## 2014-03-05 DIAGNOSIS — R350 Frequency of micturition: Secondary | ICD-10-CM

## 2014-03-05 DIAGNOSIS — M2550 Pain in unspecified joint: Secondary | ICD-10-CM

## 2014-03-05 DIAGNOSIS — I498 Other specified cardiac arrhythmias: Secondary | ICD-10-CM

## 2014-03-05 DIAGNOSIS — IMO0001 Reserved for inherently not codable concepts without codable children: Secondary | ICD-10-CM

## 2014-03-05 LAB — POCT UA - MICROSCOPIC ONLY

## 2014-03-05 LAB — POCT URINALYSIS DIPSTICK
Bilirubin, UA: NEGATIVE
Glucose, UA: NEGATIVE
KETONES UA: NEGATIVE
NITRITE UA: NEGATIVE
PH UA: 5.5
Protein, UA: 100
Spec Grav, UA: 1.015
Urobilinogen, UA: 0.2

## 2014-03-05 MED ORDER — OMEPRAZOLE 20 MG PO CPDR: 20.0000 mg | DELAYED_RELEASE_CAPSULE | Freq: Every day | ORAL | Status: DC

## 2014-03-05 NOTE — Patient Instructions (Addendum)
Thank you for coming to see me today. It was a pleasure. Today we talked about:   Chest pain: this seems related to your reflux. I will prescribe omeprazole to see if it helps with your symptoms.  Kidney injury: we will follow-up after your visit with nephrology this afternoon. Please have them send your records to [place].  Lupus: I will follow-up after your visit with rheumatology on 12/11  Please make an appointment to see me in 3 months for follow-up.  If you have any questions or concerns, please do not hesitate to call the office at [phone].  Sincerely,  Cordelia Poche, MD

## 2014-03-05 NOTE — Progress Notes (Signed)
    Subjective    Kenneth [name] is a 58 y.o. male that presents for an office visit.   1. Chest pain: Located substernal, no radiation, sharp, related to position and non-exertional. Pain lasts less than one minute. No palpitations or LOS.   2. 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 Currently followed by nephrologist. He has urine production with frequency. No dysuria. He is to follow-up with nephrology this afternoon. Otherwise, patient seems in good spirits.  3. Joint pain: patient on prednisone. No current symptoms. Patient has Rheumatology appointment on 12/11  History  Substance Use Topics  . Smoking status: Never Smoker   . Smokeless tobacco: Never Used  . Alcohol Use: No    Allergies  Allergen Reactions  . Amoxicillin     REACTION: diffuse rash    No orders of the defined types were placed in this encounter.    ROS  Per HPI   Objective   BP 150/76 mmHg  Pulse 74  Temp(Src) 97.7 F (36.5 C) (Oral)  Ht 6\' 6"  (1.981 m)  Wt 146 lb (66.225 kg)  BMI 16.88 kg/m2  General: Fair appearing, no distress Cardiovascular: Regular rate, irregular rhythm, no murmur Chest: no wheezing, clear to auscultation  Assessment and Plan   Please refer to problem based charting of assessment and plan

## 2014-03-11 ENCOUNTER — Other Ambulatory Visit: Payer: Self-pay | Admitting: Family Medicine

## 2014-03-12 ENCOUNTER — Encounter: Payer: Self-pay | Admitting: Family Medicine

## 2014-03-15 NOTE — Assessment & Plan Note (Signed)
Symptoms most likely related to new Lupus diagnosis. Patient to continue prednisone. Patient has an appointment with Rheumatology.

## 2014-03-15 NOTE — Assessment & Plan Note (Signed)
Currently followed by [facility]. Patient's AKI cause by combination of FSGS and lupus glomerular nephritis. Instructed patient to have nephrologist forward notes to clinic for review.

## 2014-03-16 ENCOUNTER — Inpatient Hospital Stay (HOSPITAL_COMMUNITY)
Admission: EM | Admit: 2014-03-16 | Discharge: 2014-03-20 | DRG: 977 | Disposition: A | Discharge status: Home / Self Care | Payer: BC Managed Care – PPO | Attending: Family Medicine | Admitting: Family Medicine

## 2014-03-16 ENCOUNTER — Emergency Department (HOSPITAL_COMMUNITY): Payer: BC Managed Care – PPO

## 2014-03-16 ENCOUNTER — Encounter (HOSPITAL_COMMUNITY): Payer: Self-pay | Admitting: Emergency Medicine

## 2014-03-16 DIAGNOSIS — R197 Diarrhea, unspecified: Secondary | ICD-10-CM | POA: Diagnosis not present

## 2014-03-16 DIAGNOSIS — M419 Scoliosis, unspecified: Secondary | ICD-10-CM | POA: Diagnosis present

## 2014-03-16 DIAGNOSIS — A084 Viral intestinal infection, unspecified: Secondary | ICD-10-CM | POA: Diagnosis present

## 2014-03-16 DIAGNOSIS — B20 Human immunodeficiency virus [HIV] disease: Secondary | ICD-10-CM | POA: Diagnosis not present

## 2014-03-16 DIAGNOSIS — R109 Unspecified abdominal pain: Secondary | ICD-10-CM

## 2014-03-16 DIAGNOSIS — E785 Hyperlipidemia, unspecified: Secondary | ICD-10-CM | POA: Diagnosis present

## 2014-03-16 DIAGNOSIS — N184 Chronic kidney disease, stage 4 (severe): Secondary | ICD-10-CM | POA: Diagnosis present

## 2014-03-16 DIAGNOSIS — M1990 Unspecified osteoarthritis, unspecified site: Secondary | ICD-10-CM | POA: Diagnosis present

## 2014-03-16 DIAGNOSIS — A072 Cryptosporidiosis: Secondary | ICD-10-CM | POA: Diagnosis present

## 2014-03-16 DIAGNOSIS — I129 Hypertensive chronic kidney disease with stage 1 through stage 4 chronic kidney disease, or unspecified chronic kidney disease: Secondary | ICD-10-CM | POA: Diagnosis present

## 2014-03-16 DIAGNOSIS — E860 Dehydration: Secondary | ICD-10-CM | POA: Diagnosis present

## 2014-03-16 DIAGNOSIS — Z7952 Long term (current) use of systemic steroids: Secondary | ICD-10-CM

## 2014-03-16 DIAGNOSIS — M3214 Glomerular disease in systemic lupus erythematosus: Secondary | ICD-10-CM | POA: Diagnosis present

## 2014-03-16 DIAGNOSIS — I10 Essential (primary) hypertension: Secondary | ICD-10-CM

## 2014-03-16 DIAGNOSIS — G4700 Insomnia, unspecified: Secondary | ICD-10-CM | POA: Diagnosis present

## 2014-03-16 DIAGNOSIS — I493 Ventricular premature depolarization: Secondary | ICD-10-CM | POA: Diagnosis present

## 2014-03-16 DIAGNOSIS — N269 Renal sclerosis, unspecified: Secondary | ICD-10-CM | POA: Diagnosis present

## 2014-03-16 DIAGNOSIS — R1011 Right upper quadrant pain: Secondary | ICD-10-CM | POA: Diagnosis not present

## 2014-03-16 DIAGNOSIS — E872 Acidosis, unspecified: Secondary | ICD-10-CM

## 2014-03-16 DIAGNOSIS — T380X5A Adverse effect of glucocorticoids and synthetic analogues, initial encounter: Secondary | ICD-10-CM | POA: Diagnosis present

## 2014-03-16 DIAGNOSIS — D61818 Other pancytopenia: Secondary | ICD-10-CM | POA: Diagnosis present

## 2014-03-16 DIAGNOSIS — Z905 Acquired absence of kidney: Secondary | ICD-10-CM | POA: Diagnosis present

## 2014-03-16 DIAGNOSIS — D649 Anemia, unspecified: Secondary | ICD-10-CM

## 2014-03-16 DIAGNOSIS — Z881 Allergy status to other antibiotic agents status: Secondary | ICD-10-CM

## 2014-03-16 DIAGNOSIS — N529 Male erectile dysfunction, unspecified: Secondary | ICD-10-CM | POA: Diagnosis present

## 2014-03-16 DIAGNOSIS — D539 Nutritional anemia, unspecified: Secondary | ICD-10-CM | POA: Diagnosis present

## 2014-03-16 HISTORY — DX: Ventricular premature depolarization: I49.3

## 2014-03-16 HISTORY — DX: Candidal esophagitis: B37.81

## 2014-03-16 HISTORY — DX: Other pancytopenia: D61.818

## 2014-03-16 HISTORY — DX: Systemic lupus erythematosus, unspecified: M32.9

## 2014-03-16 HISTORY — DX: Glomerular disease in systemic lupus erythematosus: M32.14

## 2014-03-16 LAB — URINE MICROSCOPIC-ADD ON

## 2014-03-16 LAB — CBC WITH DIFFERENTIAL/PLATELET
BASOS ABS: 0 10*3/uL (ref 0.0–0.1)
Basophils Relative: 0 % (ref 0–1)
EOS ABS: 0 10*3/uL (ref 0.0–0.7)
EOS PCT: 1 % (ref 0–5)
HCT: 32.2 % — ABNORMAL LOW (ref 39.0–52.0)
Hemoglobin: 10.4 g/dL — ABNORMAL LOW (ref 13.0–17.0)
Lymphocytes Relative: 20 % (ref 12–46)
Lymphs Abs: 0.8 10*3/uL (ref 0.7–4.0)
MCH: 30.1 pg (ref 26.0–34.0)
MCHC: 32.3 g/dL (ref 30.0–36.0)
MCV: 93.1 fL (ref 78.0–100.0)
Monocytes Absolute: 0.3 10*3/uL (ref 0.1–1.0)
Monocytes Relative: 7 % (ref 3–12)
Neutro Abs: 2.8 10*3/uL (ref 1.7–7.7)
Neutrophils Relative %: 72 % (ref 43–77)
PLATELETS: 59 10*3/uL — AB (ref 150–400)
RBC: 3.46 MIL/uL — ABNORMAL LOW (ref 4.22–5.81)
RDW: 16.7 % — AB (ref 11.5–15.5)
WBC: 3.9 10*3/uL — AB (ref 4.0–10.5)

## 2014-03-16 LAB — COMPREHENSIVE METABOLIC PANEL
ALT: 9 U/L (ref 0–53)
AST: 10 U/L (ref 0–37)
Albumin: 2.3 g/dL — ABNORMAL LOW (ref 3.5–5.2)
Alkaline Phosphatase: 59 U/L (ref 39–117)
Anion gap: 17 — ABNORMAL HIGH (ref 5–15)
BUN: 39 mg/dL — ABNORMAL HIGH (ref 6–23)
CO2: 13 mEq/L — ABNORMAL LOW (ref 19–32)
Calcium: 8.5 mg/dL (ref 8.4–10.5)
Chloride: 110 mEq/L (ref 96–112)
Creatinine, Ser: 3.39 mg/dL — ABNORMAL HIGH (ref 0.50–1.35)
GFR calc Af Amer: 22 mL/min — ABNORMAL LOW (ref >90)
GFR, EST NON AFRICAN AMERICAN: 19 mL/min — AB (ref >90)
Glucose, Bld: 129 mg/dL — ABNORMAL HIGH (ref 70–99)
Potassium: 4.6 mEq/L (ref 3.7–5.3)
SODIUM: 140 meq/L (ref 137–147)
Total Bilirubin: <0.2 mg/dL — ABNORMAL LOW (ref 0.3–1.2)
Total Protein: 6.6 g/dL (ref 6.0–8.3)

## 2014-03-16 LAB — URINALYSIS, ROUTINE W REFLEX MICROSCOPIC
Bilirubin Urine: NEGATIVE
Glucose, UA: NEGATIVE mg/dL
Ketones, ur: NEGATIVE mg/dL
Leukocytes, UA: NEGATIVE
NITRITE: NEGATIVE
Protein, ur: 100 mg/dL — AB
Specific Gravity, Urine: 1.019 (ref 1.005–1.030)
Urobilinogen, UA: 0.2 mg/dL (ref 0.0–1.0)
pH: 5 (ref 5.0–8.0)

## 2014-03-16 LAB — CLOSTRIDIUM DIFFICILE BY PCR: Toxigenic C. Difficile by PCR: NEGATIVE

## 2014-03-16 LAB — POC OCCULT BLOOD, ED: FECAL OCCULT BLD: POSITIVE — AB

## 2014-03-16 LAB — BASIC METABOLIC PANEL
Anion gap: 13 (ref 5–15)
BUN: 38 mg/dL — ABNORMAL HIGH (ref 6–23)
CO2: 13 meq/L — AB (ref 19–32)
CREATININE: 3.14 mg/dL — AB (ref 0.50–1.35)
Calcium: 7.3 mg/dL — ABNORMAL LOW (ref 8.4–10.5)
Chloride: 111 mEq/L (ref 96–112)
GFR calc Af Amer: 24 mL/min — ABNORMAL LOW (ref >90)
GFR calc non Af Amer: 21 mL/min — ABNORMAL LOW (ref >90)
Glucose, Bld: 98 mg/dL (ref 70–99)
Potassium: 4.1 mEq/L (ref 3.7–5.3)
Sodium: 137 mEq/L (ref 137–147)

## 2014-03-16 LAB — LIPASE, BLOOD: Lipase: 21 U/L (ref 11–59)

## 2014-03-16 LAB — MAGNESIUM: Magnesium: 1.7 mg/dL (ref 1.5–2.5)

## 2014-03-16 LAB — CK: Total CK: 100 U/L (ref 7–232)

## 2014-03-16 LAB — I-STAT CG4 LACTIC ACID, ED: Lactic Acid, Venous: 0.83 mmol/L (ref 0.5–2.2)

## 2014-03-16 LAB — PHOSPHORUS: PHOSPHORUS: 3.2 mg/dL (ref 2.3–4.6)

## 2014-03-16 MED ORDER — DOLUTEGRAVIR SODIUM 50 MG PO TABS
50.0000 mg | ORAL_TABLET | Freq: Two times a day (BID) | ORAL | Status: DC
  Administered 2014-03-16 – 2014-03-19 (×7): 50 mg via ORAL
  Filled 2014-03-16 (×9): qty 1

## 2014-03-16 MED ORDER — SODIUM CHLORIDE 0.9 % IV SOLN
INTRAVENOUS | Status: AC
  Administered 2014-03-16 – 2014-03-17 (×3): via INTRAVENOUS

## 2014-03-16 MED ORDER — SODIUM CHLORIDE 0.9 % IV BOLUS (SEPSIS)
1000.0000 mL | Freq: Once | INTRAVENOUS | Status: AC
  Administered 2014-03-16: 1000 mL via INTRAVENOUS

## 2014-03-16 MED ORDER — ATORVASTATIN CALCIUM 40 MG PO TABS
40.0000 mg | ORAL_TABLET | Freq: Every day | ORAL | Status: DC
  Administered 2014-03-16 – 2014-03-20 (×5): 40 mg via ORAL
  Filled 2014-03-16 (×5): qty 1

## 2014-03-16 MED ORDER — SODIUM CHLORIDE 0.9 % IV SOLN
Freq: Once | INTRAVENOUS | Status: AC
  Administered 2014-03-16: 13:00:00 via INTRAVENOUS

## 2014-03-16 MED ORDER — ONDANSETRON HCL 4 MG/2ML IJ SOLN: 4.0000 mg | Freq: Three times a day (TID) | INTRAMUSCULAR | Status: DC | PRN

## 2014-03-16 MED ORDER — MYCOPHENOLATE MOFETIL 500 MG PO TABS: 1000.0000 mg | ORAL_TABLET | Freq: Two times a day (BID) | ORAL | Status: DC

## 2014-03-16 MED ORDER — LAMIVUDINE 10 MG/ML PO SOLN
50.0000 mg | Freq: Every day | ORAL | Status: DC
  Administered 2014-03-16 – 2014-03-18 (×3): 50 mg via ORAL
  Filled 2014-03-16 (×4): qty 5

## 2014-03-16 MED ORDER — MYCOPHENOLATE MOFETIL 250 MG PO CAPS
1000.0000 mg | ORAL_CAPSULE | Freq: Two times a day (BID) | ORAL | Status: DC
  Administered 2014-03-16: 1000 mg via ORAL
  Filled 2014-03-16 (×3): qty 4

## 2014-03-16 MED ORDER — SODIUM CHLORIDE 0.9 % IJ SOLN
3.0000 mL | Freq: Two times a day (BID) | INTRAMUSCULAR | Status: DC
  Administered 2014-03-17 – 2014-03-20 (×3): 3 mL via INTRAVENOUS

## 2014-03-16 MED ORDER — DIPHENOXYLATE-ATROPINE 2.5-0.025 MG PO TABS
1.0000 | ORAL_TABLET | Freq: Once | ORAL | Status: AC
  Administered 2014-03-16: 1 via ORAL
  Filled 2014-03-16: qty 1

## 2014-03-16 MED ORDER — IOHEXOL 300 MG/ML  SOLN
25.0000 mL | INTRAMUSCULAR | Status: AC
  Administered 2014-03-16 (×2): 25 mL via ORAL

## 2014-03-16 MED ORDER — SODIUM CHLORIDE 0.9 % IV SOLN
INTRAVENOUS | Status: AC
  Administered 2014-03-16: 18:00:00 via INTRAVENOUS

## 2014-03-16 MED ORDER — PREDNISONE 50 MG PO TABS
60.0000 mg | ORAL_TABLET | Freq: Every day | ORAL | Status: DC
  Administered 2014-03-17 – 2014-03-20 (×4): 60 mg via ORAL
  Filled 2014-03-16 (×5): qty 1

## 2014-03-16 MED ORDER — HEPARIN SODIUM (PORCINE) 5000 UNIT/ML IJ SOLN
5000.0000 [IU] | Freq: Three times a day (TID) | INTRAMUSCULAR | Status: DC
  Administered 2014-03-16 – 2014-03-17 (×2): 5000 [IU] via SUBCUTANEOUS
  Filled 2014-03-16 (×5): qty 1

## 2014-03-16 MED ORDER — PANTOPRAZOLE SODIUM 40 MG PO TBEC
40.0000 mg | DELAYED_RELEASE_TABLET | Freq: Every day | ORAL | Status: DC
  Administered 2014-03-16 – 2014-03-19 (×4): 40 mg via ORAL
  Filled 2014-03-16 (×5): qty 1

## 2014-03-16 MED ORDER — PROMETHAZINE HCL 25 MG/ML IJ SOLN
25.0000 mg | Freq: Four times a day (QID) | INTRAMUSCULAR | Status: DC | PRN
Start: 2014-03-16 — End: 2014-03-20
  Administered 2014-03-18: 25 mg via INTRAVENOUS
  Filled 2014-03-16: qty 1

## 2014-03-16 MED ORDER — ABACAVIR SULFATE 300 MG PO TABS
600.0000 mg | ORAL_TABLET | Freq: Every day | ORAL | Status: DC
  Administered 2014-03-16 – 2014-03-17 (×2): 600 mg via ORAL
  Administered 2014-03-18: 300 mg via ORAL
  Filled 2014-03-16 (×3): qty 2

## 2014-03-16 MED ORDER — TRAMADOL HCL 50 MG PO TABS: 50.0000 mg | ORAL_TABLET | Freq: Three times a day (TID) | ORAL | Status: DC | PRN

## 2014-03-16 NOTE — ED Notes (Signed)
Pt c/o abdominal pain and diarrhea x 1 week. Pt denies N/V

## 2014-03-16 NOTE — ED Notes (Signed)
Attempted to call report, no answer on floor.  RN to call back.

## 2014-03-16 NOTE — ED Provider Notes (Signed)
CSN: [id number]     Arrival date & time 03/16/14  1005 History   First MD Initiated Contact with Patient 03/16/14 1105     Chief Complaint  Patient presents with  . Diarrhea  . Abdominal Pain     (Consider location/radiation/quality/duration/timing/severity/associated sxs/prior Treatment) HPI Kenneth [name] is a 58 y.o. male with history of HIV, hypertension, nephrectomy, recently renal failure, presents to emergency department complaining of abdominal pain and diarrhea. Patient states his symptoms began 1 week ago. States recent admission 1 month ago, diagnosed with renal failure. States has followed up with nephrology, pcp, improving. States he has not eaten much in last week bc no appetite. Denies n/v. Right sided abdominal pain. States diarrhea watery, 4-5 times a day. At times normal brown, at times dark in color. No bright red blood. Denies any other complaints.      Past Medical History  Diagnosis Date  . HIV infection   . Hyperlipidemia   . Hypertension   . Insomnia   . Dysphagia   . Achalasia   . Renal abscess     Stage II kidney disease  . Hemorrhoids, internal   . Arthritis    Past Surgical History  Procedure Laterality Date  . Nephrectomy      Right   Family History  Problem Relation Age of Onset  . Colon cancer Father    History  Substance Use Topics  . Smoking status: Never Smoker   . Smokeless tobacco: Never Used  . Alcohol Use: No    Review of Systems  Constitutional: Negative for fever and chills.  Respiratory: Negative for cough, chest tightness and shortness of breath.   Cardiovascular: Negative for chest pain, palpitations and leg swelling.  Gastrointestinal: Positive for abdominal pain and diarrhea. Negative for nausea, vomiting and abdominal distention.  Genitourinary: Negative for dysuria, frequency and hematuria.  Musculoskeletal: Negative for myalgias, arthralgias, neck pain and neck stiffness.  Skin: Negative for rash.   Allergic/Immunologic: Negative for immunocompromised state.  Neurological: Negative for dizziness, weakness, light-headedness, numbness and headaches.  All other systems reviewed and are negative.     Allergies  Amoxicillin  Home Medications   Prior to Admission medications   Medication Sig Start Date End Date Taking? Authorizing Provider  abacavir (ZIAGEN) 300 MG tablet Take 2 tablets (600 mg total) by mouth daily. 02/18/14   [place] N Rumley, DO  acetaminophen (TYLENOL) 500 MG tablet Take 500 mg by mouth every 6 (six) hours as needed for mild pain.    Historical Provider, MD  amLODipine (NORVASC) 10 MG tablet Take 1 tablet (10 mg total) by mouth daily. 01/20/14   Cordelia Poche, MD  atorvastatin (LIPITOR) 40 MG tablet Take 1 tablet (40 mg total) by mouth daily. 02/18/14   [place] N Rumley, DO  cyclobenzaprine (FLEXERIL) 5 MG tablet Take 5 mg by mouth 3 (three) times daily as needed for muscle spasms.    Historical Provider, MD  dolutegravir (TIVICAY) 50 MG tablet Take 1 tablet (50 mg total) by mouth 2 (two) times daily. 02/18/14   [place] N Rumley, DO  lamiVUDine (EPIVIR) 10 MG/ML solution Take 5 mLs (50 mg total) by mouth daily. 02/18/14   [place] N Rumley, DO  mycophenolate (CELLCEPT) 500 MG tablet Take 1,000 mg by mouth 2 (two) times daily. 02/20/14   Historical Provider, MD  omeprazole (PRILOSEC) 20 MG capsule Take 1 capsule (20 mg total) by mouth daily. 03/05/14   Cordelia Poche, MD  predniSONE (DELTASONE) 20 MG tablet Take  3 tablets (60 mg total) by mouth daily with breakfast. 02/18/14   [place] N Rumley, DO  traMADol (ULTRAM) 50 MG tablet Take 1 tablet (50 mg total) by mouth every 8 (eight) hours as needed. Patient taking differently: Take 50 mg by mouth every 8 (eight) hours as needed for moderate pain.  02/02/14   Bryan R Hess, DO   BP 116/71 mmHg  Pulse 127  Temp(Src) 98.2 F (36.8 C) (Oral)  Resp 16  Ht 6\' 1"  (1.854 m)  Wt 132 lb (59.875 kg)  BMI 17.42 kg/m2  SpO2  100% Physical Exam  Constitutional: He is oriented to person, place, and time. He appears well-developed and well-nourished. No distress.  Thin   HENT:  Head: Normocephalic and atraumatic.  Eyes: Conjunctivae are normal.  Neck: Neck supple.  Cardiovascular: Regular rhythm and normal heart sounds.   tachycardic  Pulmonary/Chest: Effort normal. No respiratory distress. He has no wheezes. He has no rales.  Abdominal: Soft. Bowel sounds are normal. He exhibits no distension. There is tenderness. There is no rebound and no guarding.  Right mid and Right lower quadrant tenderness  Musculoskeletal: He exhibits no edema.  Neurological: He is alert and oriented to person, place, and time.  Skin: Skin is warm and dry.  Psychiatric: He has a normal mood and affect. His behavior is normal.  Nursing note and vitals reviewed.   ED Course  Procedures (including critical care time) Labs Review Labs Reviewed  COMPREHENSIVE METABOLIC PANEL - Abnormal; Notable for the following:    CO2 13 (*)    Glucose, Bld 129 (*)    BUN 39 (*)    Creatinine, Ser 3.39 (*)    Albumin 2.3 (*)    Total Bilirubin <0.2 (*)    GFR calc non Af Amer 19 (*)    GFR calc Af Amer 22 (*)    Anion gap 17 (*)    All other components within normal limits  CBC WITH DIFFERENTIAL - Abnormal; Notable for the following:    WBC 3.9 (*)    RBC 3.46 (*)    Hemoglobin 10.4 (*)    HCT 32.2 (*)    RDW 16.7 (*)    Platelets 59 (*)    All other components within normal limits  CLOSTRIDIUM DIFFICILE BY PCR  LIPASE, BLOOD  URINALYSIS, ROUTINE W REFLEX MICROSCOPIC  POC OCCULT BLOOD, ED    Imaging Review Ct Abdomen Pelvis Wo Contrast  03/16/2014   CLINICAL DATA:  Generalized abdominal pain and diarrhea for 1 week, history HIV, hypertension  EXAM: CT ABDOMEN AND PELVIS WITHOUT CONTRAST  TECHNIQUE: Multidetector CT imaging of the abdomen and pelvis was performed following the standard protocol without IV contrast. IV contrast not  utilized due to renal dysfunction.  COMPARISON:  01/18/2009  FINDINGS: Minimal atelectasis or scarring at LEFT base.  Perigastric surgical clips with thickening at GE junction question prior antireflux procedure.  Retained contrast within a distended esophagus.  Calcified gallstones dependently in gallbladder.  Within limits of a nonenhanced exam no focal abnormalities of the liver, spleen, pancreas, LEFT kidney, or adrenal glands.  Surgically absent RIGHT kidney.  Colon is diffusely fluid filled and distended consistent with history of diarrhea.  Small bowel loops opacified proximally, opacified distally, normal in caliber without suggestion of obstruction.  Decompressed IVC.  No mass, adenopathy, bowel wall thickening, free fluid, or free air.  Dextro convex thoracolumbar scoliosis.  IMPRESSION: Fluid filled distended colon to the rectum consistent with clinical presentation of diarrhea.  No definite bowel wall thickening or evidence of bowel obstruction.  Prior gastric surgery question anti reflux procedure with contrast distending the esophagus suggesting a degree of obstruction at the GE junction.  Post RIGHT nephrectomy.  No other definite intra-abdominal or intrapelvic abnormalities.   Electronically Signed   By: Lavonia Dana M.D.   On: 03/16/2014 14:50     EKG Interpretation   Date/Time:  Monday March 16 2014 11:36:20 EST Ventricular Rate:  101 PR Interval:  115 QRS Duration: 78 QT Interval:  383 QTC Calculation: 496 R Axis:   31 Text Interpretation:  Sinus tachycardia Ventricular tachycardia,  unsustained Consider left ventricular hypertrophy Borderline prolonged QT  interval Rate faster Confirmed by Wyvonnia Dusky  MD, STEPHEN ([id number]) on  03/16/2014 11:40:12 AM      MDM   Final diagnoses:  Abdominal pain, acute  Diarrhea  Metabolic acidosis     patient with you for over a week, weakness, abdominal pain. Patient appears to be dehydrated. Heart rate of 120s, sinus rhythm. Will start IV  fluids, labs ordered. Urinalysis ordered. Patient admits to not taking any of his medications over last week. Will monitor.  Bicarb low, anion gap 17. Will continue to hydrate  4:38 PM Recheck metabolic panel, bicarb still 13 although anion gap improved. VS normal. Spoke with Family practice. Will admit for hydration and recheck metabolic panel.   Filed Vitals:   03/16/14 1215 03/16/14 1230 03/16/14 1449 03/16/14 1500  BP: 126/85 130/85 135/80 134/77  Pulse: 99 112 86 84  Temp:      TempSrc:      Resp: 18 26 20 13   Height:      Weight:      SpO2: 100% 100% 100% 100%     Renold Genta, PA-C 03/16/14 [place], MD 03/16/14 2009

## 2014-03-16 NOTE — Progress Notes (Signed)
Report received from [facility] for admission to [phone]

## 2014-03-16 NOTE — H&P (Signed)
[facility] Admission History and Physical Service Pager: [phone]  Patient name: Kenneth [name] Medical record number: [id number] Date of birth: 24-Mar-1956 Age: 58 y.o. Gender: male  Primary Care Provider: Cordelia Poche, MD Consultants: None Code Status: Full  Chief Complaint: Diarrhea, abdominal pain  Assessment and Plan: Kenneth [name] is a 58 y.o. male presenting with diarrhea and abdominal pain. 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 Diarrhea with unclear etiology. Unlikely to be gastroenteritis given lack of nausea and vomiting. Can consider inflammatory source of diarrhea given positive FOBT, though patient is without fevers or chills. C diff infection ruled out. Can consider atypical pathogens such as cryptosporidium or CMV given patient's history of HIV and immunosuppression with cellcept and chronic steroids for lupus nephritis. Last colonoscopy in 2008. CT scan without bowel wall thickening suggestive of colitis.  -Full liquid diet, ADAT -NS @ 122ml/hr -f/u stool pathogen panel, stool culture, O&P, cryptosporidium -f/u CK to evaluate for rhabdomyolysis given large hgb on UA with 3-6 RBC -f/u AM BMP - Endoscopy 2012 for dysphagia with esophageal candidiasis, however symptoms unlikely to be due to yeast at this time.   +FOBT. Likely related to patient's diarrhea. HgB stable on admission at 10.4, up from 8.5 last month. (likely due to hemoconcentration from dehydration) Currently hemodynamically stable. Last colonoscopy in 2008.  -continue home PPI -Consult GI if concern for acute GI bleed or if patient becomes unstable -Continue to trend CBCs  PVCs. Patient with several PVCs noted on EKG and with significant ectopy on physical exam. Currently asymptomatic. Potentially related to patient's dehydration. No electrolyte  abnormalities on exam. -Monitor on telemetry -f/u mg and phos -Change anti-emetic to phenergan from zofran given QTC 494  HIV. CD4 count of 240 on 02/13/2014 with non detectable viral load. -Continue home ziagen, tivicay, epivir -Inform Dr [name] of patient's admission in the morning.   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 Cr 3.14 on admission (down from 4.65 on discharge last month). -Continue home cellcept and prednisose -IVF as above -f/u AM BMP  HTN. At goal on admission - Holding home amlodipine given dehydration and normal BPs here - will add back as indicated  HLD.  Continue home statin  FEN/GI: Regular diet Prophylaxis: SubQ heparin  Disposition: Admitted to telemetry under attending Dr Ree Kida pending above management.   History of Present Illness: Kenneth [name] is a 58 y.o. male presenting with diarrhea and abdominal pain.   Patient reports watery diarrhea 4-5 times per day for the past week. No nausea or vomiting. Stool has ranged in color from brown to dark appearing, being described as dark black at one point. No bright red blood per rectum. Diarrhea is not associated with eating. Patient has not tried to eat very much for the past week Patient also recently stopped taking all of his medications due to not having enough energy to get out of bed and take his medications. No new drugs or antibiotics. No NSAID use. No dysuria, or hematuria. No fevers, chills, or sweats.   The patient also reports right upper quadrant abdominal pain that is associated with the bowel movements that can last for up to 2 hours. Pain is described as a pressure-like sensation that does not radiate. Pain is not alleviated by bowel movements. Pain not associated with eating.   In the ED, patient was initially tachycardic  and initial work up was remarkable for an anion gap metabolic acidosis. Patient was given 2L of NS with improvement in his heart rate and anion gap, though patient continued to  have a low bicarb (13). Additional work up included a negative lactic acid, unremarkable abdominal CT, negative c diff PCR, and positive FOBT.  Review Of Systems: Per HPI, Otherwise 12 point review of systems was performed and was unremarkable.  Patient Active Problem List   Diagnosis Date Noted  . Dehydration 03/16/2014  . Abdominal pain   . Protein-calorie malnutrition, severe 02/11/2014  . Lupus nephritis   . Hematuria 02/10/2014  . Acute renal failure syndrome   . Fever   . AKI (acute kidney injury) 02/09/2014  . Arthralgia of multiple sites, bilateral 02/02/2014  . Joint swelling 02/01/2014  . Bilateral low back pain without sciatica 02/01/2014  . Chronic kidney disease (CKD), stage II (mild) 01/12/2010  . EXTERNAL HEMORRHOIDS WITHOUT MENTION COMP 01/20/2009  . RENAL ABSCESS 01/15/2009  . Anemia 08/31/2006  . HIV disease 07/24/2006  . Hyperlipidemia 05/31/2006  . ERECTILE DYSFUNCTION 05/31/2006  . Essential hypertension 05/31/2006  . INSOMNIA NOS 05/31/2006  . DYSPHAGIA 05/31/2006   Past Medical History: Past Medical History  Diagnosis Date  . HIV infection   . Hyperlipidemia   . Hypertension   . Insomnia   . Dysphagia   . Achalasia   . Renal abscess     Stage II kidney disease  . Hemorrhoids, internal   . Arthritis    Past Surgical History: Past Surgical History  Procedure Laterality Date  . Nephrectomy      Right   Social History: History  Substance Use Topics  . Smoking status: Never Smoker   . Smokeless tobacco: Never Used  . Alcohol Use: No   Additional social history: N/a Please also refer to relevant sections of EMR.  Family History: Family History  Problem Relation Age of Onset  . Colon cancer Father    Allergies and Medications: Allergies  Allergen Reactions  . Amoxicillin     REACTION: diffuse rash   No current facility-administered medications on file prior to encounter.   Current Outpatient Prescriptions on File Prior to  Encounter  Medication Sig Dispense Refill  . abacavir (ZIAGEN) 300 MG tablet Take 2 tablets (600 mg total) by mouth daily. 60 tablet 0  . acetaminophen (TYLENOL) 500 MG tablet Take 500 mg by mouth every 6 (six) hours as needed for mild pain.    Marland Kitchen atorvastatin (LIPITOR) 40 MG tablet Take 1 tablet (40 mg total) by mouth daily. 30 tablet 0  . cyclobenzaprine (FLEXERIL) 5 MG tablet Take 5 mg by mouth 3 (three) times daily as needed for muscle spasms.    . dolutegravir (TIVICAY) 50 MG tablet Take 1 tablet (50 mg total) by mouth 2 (two) times daily. 60 tablet 0  . lamiVUDine (EPIVIR) 10 MG/ML solution Take 5 mLs (50 mg total) by mouth daily. 240 mL 0  . mycophenolate (CELLCEPT) 500 MG tablet Take 1,000 mg by mouth 2 (two) times daily.  0  . omeprazole (PRILOSEC) 20 MG capsule Take 1 capsule (20 mg total) by mouth daily. (Patient taking differently: Take 20 mg by mouth daily as needed (Heartburn). ) 30 capsule 2  . predniSONE (DELTASONE) 20 MG tablet Take 3 tablets (60 mg total) by mouth daily with breakfast. 90 tablet 0  . traMADol (ULTRAM) 50 MG tablet Take 1 tablet (50 mg total) by mouth every 8 (eight) hours as needed. (  Patient taking differently: Take 50 mg by mouth every 8 (eight) hours as needed for moderate pain. ) 60 tablet 0  . amLODipine (NORVASC) 10 MG tablet Take 1 tablet (10 mg total) by mouth daily. 30 tablet 11    Objective: BP 120/74 mmHg  Pulse 93  Temp(Src) 100.2 F (37.9 C) (Oral)  Resp 18  Ht 6\' 1"  (1.854 m)  Wt 133 lb (60.328 kg)  BMI 17.55 kg/m2  SpO2 100% Exam: General: Non-toxic appearing man sitting in hospital bed in NAD HEENT: PERRL, MMM Cardiovascular: Several ectopic beats noted, regular rate. No murmurs appreciated Respiratory: NWOB, CTAB, no wheezes appreciated Abdomen: +BS, tender to palpation in RUQ with voluntary guarding, negative Murphy's sign, Nondistended, no rebound Extremities: No cyanosis or edema Skin: No apparent rashes Neuro: Alert and oriented,  no focal neurologic deficits.   Labs and Imaging: CBC BMET   Recent Labs Lab 03/16/14 1047  WBC 3.9*  HGB 10.4*  HCT 32.2*  PLT 59*    Recent Labs Lab 03/16/14 1507  NA 137  K 4.1  CL 111  CO2 13*  BUN 38*  CREATININE 3.14*  GLUCOSE 98  CALCIUM 7.3*     Urinalysis    Component Value Date/Time   COLORURINE YELLOW 03/16/2014 1131   APPEARANCEUR CLOUDY* 03/16/2014 1131   LABSPEC 1.019 03/16/2014 1131   PHURINE 5.0 03/16/2014 1131   GLUCOSEU NEGATIVE 03/16/2014 1131   GLUCOSEU NEG mg/dL 08/16/2006 2308   HGBUR LARGE* 03/16/2014 1131   BILIRUBINUR NEGATIVE 03/16/2014 1131   BILIRUBINUR neg 03/05/2014 1214   KETONESUR NEGATIVE 03/16/2014 1131   PROTEINUR 100* 03/16/2014 1131   PROTEINUR 100 03/05/2014 1214   UROBILINOGEN 0.2 03/16/2014 1131   UROBILINOGEN 0.2 03/05/2014 1214   NITRITE NEGATIVE 03/16/2014 1131   NITRITE neg 03/05/2014 1214   LEUKOCYTESUR NEGATIVE 03/16/2014 1131   c diff negative Lactic acid 0.83  EKG: Sinus tachycardia with several PVCs noted, no acute ischemic changes.  Ct Abdomen Pelvis Wo Contrast  03/16/2014   CLINICAL DATA:  Generalized abdominal pain and diarrhea for 1 week, history HIV, hypertension  EXAM: CT ABDOMEN AND PELVIS WITHOUT CONTRAST  TECHNIQUE: Multidetector CT imaging of the abdomen and pelvis was performed following the standard protocol without IV contrast. IV contrast not utilized due to renal dysfunction.  COMPARISON:  01/18/2009  FINDINGS: Minimal atelectasis or scarring at LEFT base.  Perigastric surgical clips with thickening at GE junction question prior antireflux procedure.  Retained contrast within a distended esophagus.  Calcified gallstones dependently in gallbladder.  Within limits of a nonenhanced exam no focal abnormalities of the liver, spleen, pancreas, LEFT kidney, or adrenal glands.  Surgically absent RIGHT kidney.  Colon is diffusely fluid filled and distended consistent with history of diarrhea.  Small bowel  loops opacified proximally, opacified distally, normal in caliber without suggestion of obstruction.  Decompressed IVC.  No mass, adenopathy, bowel wall thickening, free fluid, or free air.  Dextro convex thoracolumbar scoliosis.  IMPRESSION: Fluid filled distended colon to the rectum consistent with clinical presentation of diarrhea.  No definite bowel wall thickening or evidence of bowel obstruction.  Prior gastric surgery question anti reflux procedure with contrast distending the esophagus suggesting a degree of obstruction at the GE junction.  Post RIGHT nephrectomy.  No other definite intra-abdominal or intrapelvic abnormalities.   Electronically Signed   By: Lavonia Dana M.D.   On: 03/16/2014 14:50     Dimas Chyle, MD 03/16/2014, 6:18 PM PGY-1, [place] Intern  pager: [phone], text pages welcome   I have seen and examined the patient with Dr. Jerline Pain and I agree with his documentation above. My annotations are in blue.   Laroy Apple, MD [place] Resident, PGY-3 03/16/2014, 8:52 PM

## 2014-03-16 NOTE — Progress Notes (Signed)
[name] [id number] Code Status: not address currently Admission Data: 03/16/2014 6:01 PM Attending Provider:  Nori Riis [id number], Deidre Ala, MD Consults/ Treatment Team:  [facility] Medicine Teaching Service  Kenneth [name] is a 58 y.o. male patient admitted from ED awake, alert - oriented  X 3 - no acute distress noted.  VSS - Blood pressure 120/74, pulse 93, temperature 100.2 F (37.9 C), temperature source Oral, resp. rate 18, height 6\' 1"  (1.854 m), weight 60.328 kg (133 lb), SpO2 100 %.    IV in place, occlusive dsg intact without redness.  Orientation to room, and floor completed with information packet given to patient/family.  Patient declined safety video at this time.  Admission INP armband ID verified with patient/family, and in place.   SR up x 2, fall assessment complete, with patient and family able to verbalize understanding of risk associated with falls, and verbalized understanding to call nsg before up out of bed.  Call light within reach, patient able to voice, and demonstrate understanding.  Skin, clean-dry- intact without evidence of bruising, or skin tears.   No evidence of skin break down noted on exam.  MD paged to make aware that patient has arrived to unit.      Will cont to eval and treat per MD orders.  Delman Cheadle, RN 03/16/2014 6:01 PM

## 2014-03-17 ENCOUNTER — Encounter (HOSPITAL_COMMUNITY): Payer: Self-pay | Admitting: Nurse Practitioner

## 2014-03-17 DIAGNOSIS — E860 Dehydration: Secondary | ICD-10-CM | POA: Diagnosis present

## 2014-03-17 DIAGNOSIS — M419 Scoliosis, unspecified: Secondary | ICD-10-CM | POA: Diagnosis present

## 2014-03-17 DIAGNOSIS — Z905 Acquired absence of kidney: Secondary | ICD-10-CM | POA: Diagnosis present

## 2014-03-17 DIAGNOSIS — E785 Hyperlipidemia, unspecified: Secondary | ICD-10-CM | POA: Diagnosis present

## 2014-03-17 DIAGNOSIS — M3214 Glomerular disease in systemic lupus erythematosus: Secondary | ICD-10-CM | POA: Diagnosis present

## 2014-03-17 DIAGNOSIS — N529 Male erectile dysfunction, unspecified: Secondary | ICD-10-CM | POA: Diagnosis present

## 2014-03-17 DIAGNOSIS — A072 Cryptosporidiosis: Secondary | ICD-10-CM | POA: Diagnosis present

## 2014-03-17 DIAGNOSIS — Z7952 Long term (current) use of systemic steroids: Secondary | ICD-10-CM | POA: Diagnosis not present

## 2014-03-17 DIAGNOSIS — A084 Viral intestinal infection, unspecified: Secondary | ICD-10-CM | POA: Diagnosis present

## 2014-03-17 DIAGNOSIS — Z881 Allergy status to other antibiotic agents status: Secondary | ICD-10-CM | POA: Diagnosis not present

## 2014-03-17 DIAGNOSIS — T380X5A Adverse effect of glucocorticoids and synthetic analogues, initial encounter: Secondary | ICD-10-CM | POA: Diagnosis present

## 2014-03-17 DIAGNOSIS — I129 Hypertensive chronic kidney disease with stage 1 through stage 4 chronic kidney disease, or unspecified chronic kidney disease: Secondary | ICD-10-CM | POA: Diagnosis present

## 2014-03-17 DIAGNOSIS — N184 Chronic kidney disease, stage 4 (severe): Secondary | ICD-10-CM | POA: Diagnosis present

## 2014-03-17 DIAGNOSIS — D61818 Other pancytopenia: Secondary | ICD-10-CM | POA: Diagnosis present

## 2014-03-17 DIAGNOSIS — R197 Diarrhea, unspecified: Secondary | ICD-10-CM | POA: Diagnosis present

## 2014-03-17 DIAGNOSIS — I493 Ventricular premature depolarization: Secondary | ICD-10-CM

## 2014-03-17 DIAGNOSIS — E872 Acidosis: Secondary | ICD-10-CM | POA: Diagnosis present

## 2014-03-17 DIAGNOSIS — B20 Human immunodeficiency virus [HIV] disease: Secondary | ICD-10-CM | POA: Diagnosis present

## 2014-03-17 DIAGNOSIS — G4700 Insomnia, unspecified: Secondary | ICD-10-CM | POA: Diagnosis present

## 2014-03-17 DIAGNOSIS — R109 Unspecified abdominal pain: Secondary | ICD-10-CM | POA: Insufficient documentation

## 2014-03-17 DIAGNOSIS — N269 Renal sclerosis, unspecified: Secondary | ICD-10-CM | POA: Diagnosis present

## 2014-03-17 DIAGNOSIS — R100 Acute abdomen: Secondary | ICD-10-CM

## 2014-03-17 DIAGNOSIS — I369 Nonrheumatic tricuspid valve disorder, unspecified: Secondary | ICD-10-CM

## 2014-03-17 DIAGNOSIS — M1990 Unspecified osteoarthritis, unspecified site: Secondary | ICD-10-CM | POA: Diagnosis present

## 2014-03-17 DIAGNOSIS — D649 Anemia, unspecified: Secondary | ICD-10-CM | POA: Diagnosis not present

## 2014-03-17 DIAGNOSIS — I10 Essential (primary) hypertension: Secondary | ICD-10-CM | POA: Diagnosis not present

## 2014-03-17 LAB — BASIC METABOLIC PANEL
Anion gap: 11 (ref 5–15)
BUN: 36 mg/dL — ABNORMAL HIGH (ref 6–23)
CO2: 13 mEq/L — ABNORMAL LOW (ref 19–32)
CREATININE: 3.03 mg/dL — AB (ref 0.50–1.35)
Calcium: 7.2 mg/dL — ABNORMAL LOW (ref 8.4–10.5)
Chloride: 112 mEq/L (ref 96–112)
GFR calc non Af Amer: 22 mL/min — ABNORMAL LOW (ref >90)
GFR, EST AFRICAN AMERICAN: 25 mL/min — AB (ref >90)
Glucose, Bld: 105 mg/dL — ABNORMAL HIGH (ref 70–99)
Potassium: 4.1 mEq/L (ref 3.7–5.3)
Sodium: 136 mEq/L — ABNORMAL LOW (ref 137–147)

## 2014-03-17 LAB — CBC
HCT: 22.1 % — ABNORMAL LOW (ref 39.0–52.0)
HEMATOCRIT: 22.9 % — AB (ref 39.0–52.0)
HEMOGLOBIN: 7.6 g/dL — AB (ref 13.0–17.0)
Hemoglobin: 7.4 g/dL — ABNORMAL LOW (ref 13.0–17.0)
MCH: 30.8 pg (ref 26.0–34.0)
MCH: 30.6 pg (ref 26.0–34.0)
MCHC: 33.5 g/dL (ref 30.0–36.0)
MCHC: 33.2 g/dL (ref 30.0–36.0)
MCV: 92.1 fL (ref 78.0–100.0)
MCV: 92.3 fL (ref 78.0–100.0)
PLATELETS: 49 10*3/uL — AB (ref 150–400)
Platelets: 54 10*3/uL — ABNORMAL LOW (ref 150–400)
RBC: 2.4 MIL/uL — ABNORMAL LOW (ref 4.22–5.81)
RBC: 2.48 MIL/uL — ABNORMAL LOW (ref 4.22–5.81)
RDW: 16.6 % — ABNORMAL HIGH (ref 11.5–15.5)
RDW: 16.7 % — AB (ref 11.5–15.5)
WBC: 2.4 10*3/uL — ABNORMAL LOW (ref 4.0–10.5)
WBC: 2.1 10*3/uL — ABNORMAL LOW (ref 4.0–10.5)

## 2014-03-17 LAB — PROTIME-INR
INR: 1.44 (ref 0.00–1.49)
INR: 1.5 — AB (ref 0.00–1.49)
Prothrombin Time: 17.7 seconds — ABNORMAL HIGH (ref 11.6–15.2)
Prothrombin Time: 18.3 seconds — ABNORMAL HIGH (ref 11.6–15.2)

## 2014-03-17 LAB — APTT: APTT: 31 s (ref 24–37)

## 2014-03-17 LAB — LACTATE DEHYDROGENASE: LDH: 257 U/L — ABNORMAL HIGH (ref 94–250)

## 2014-03-17 MED ORDER — BOOST / RESOURCE BREEZE PO LIQD
1.0000 | Freq: Three times a day (TID) | ORAL | Status: DC
  Administered 2014-03-17: 1 via ORAL

## 2014-03-17 MED ORDER — MYCOPHENOLATE MOFETIL 250 MG PO CAPS
500.0000 mg | ORAL_CAPSULE | Freq: Two times a day (BID) | ORAL | Status: DC
  Administered 2014-03-17: 500 mg via ORAL
  Filled 2014-03-17 (×2): qty 2

## 2014-03-17 MED ORDER — MAGNESIUM SULFATE IN D5W 10-5 MG/ML-% IV SOLN
1.0000 g | Freq: Once | INTRAVENOUS | Status: AC
  Administered 2014-03-17: 1 g via INTRAVENOUS
  Filled 2014-03-17: qty 100

## 2014-03-17 NOTE — Consult Note (Signed)
Reason for Consult:Nephritis, CKD 4 Referring Physician: Dr Thea Silversmith is an 58 y.o. male.  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  Baseline Cr about 1.5 on HAART tx.  Presented 11/15 with polyathralgias and found to have AKI with Cr in 5s-6s.  Underwent eval and found to have active urine sediment, high titer ANA, low pos ANCA, low Comp.  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  Activity of 11/24 and chronicity of 4/12.  Had been on empiric pred since early admit and then Cellcept added.  Saw Dr. Roselie Skinner in office on 12/3 doing well and Cr in 3s.  Now admitted with 1 wk of diarrheal illness, 5-6 loose BM/d.  Cr in low 3s. Has been given ivf.  Also present is thrombocytopenia, leucopenia and anemia.          Denies rash, sores in mouth, arthritis or arthralgias.  No visible blood in stools or abdm pain.  No edema or SOB but has malaise , fatigue. Constitutional: as above Eyes: negative Ears, nose, mouth, throat, and face: negative Respiratory: negative Cardiovascular: negative Gastrointestinal: as above, some cramping at time of BM Genitourinary:negative Integument/breast: negative Hematologic/lymphatic: as above Musculoskeletal:negative Neurological: negative Endocrine: negative Allergic/Immunologic: Amoxicillin   Primary Nephrologist [place]. .   Past Medical History  Diagnosis Date  . HIV infection     a. 02/13/2014 CD4 = 240 - undetectable viral load.  . Hyperlipidemia   . Hypertension   . Insomnia   . Dysphagia   . Achalasia   . Renal abscess   . Hemorrhoids, internal   . Arthritis   . Premature ventricular contractions   . Pancytopenia   . CKD (chronic kidney disease), stage II   . Lupus nephritis   . SLE (systemic lupus erythematosus)   . Candida infection, esophageal     a. 2012 - noted on  EGD.    Past Surgical History  Procedure Laterality Date  . Nephrectomy      Right    Family History  Problem Relation Age of Onset  . Colon cancer Father     deceased  . Other Mother     s/p pacemaker - alive and well.  . Other      5 brothers, 3 sisters - alive and well.    Social History:  reports that he has never smoked. He has never used smokeless tobacco. He reports that he does not drink alcohol or use illicit drugs.  Allergies:  Allergies  Allergen Reactions  . Amoxicillin     REACTION: diffuse rash    Medications:  I have reviewed the patient's current medications. Prior to Admission:  Prescriptions prior to admission  Medication Sig Dispense Refill Last Dose  . abacavir (ZIAGEN) 300 MG tablet Take 2 tablets (600 mg total) by mouth daily. 60 tablet 0 03/14/2014 at Unknown time  . acetaminophen (TYLENOL) 500 MG tablet Take 500 mg by mouth every 6 (six) hours as needed for mild pain.   Past Month at Unknown time  . atorvastatin (LIPITOR) 40 MG tablet Take 1 tablet (40 mg total) by mouth daily. 30 tablet 0 03/14/2014  . cyclobenzaprine (FLEXERIL) 5 MG tablet Take 5 mg by mouth 3 (three) times daily as needed for muscle spasms.   Past Week at Unknown time  . dolutegravir (TIVICAY) 50 MG  tablet Take 1 tablet (50 mg total) by mouth 2 (two) times daily. 60 tablet 0 03/14/2014  . lamiVUDine (EPIVIR) 10 MG/ML solution Take 5 mLs (50 mg total) by mouth daily. 240 mL 0 03/14/2014  . mycophenolate (CELLCEPT) 500 MG tablet Take 1,000 mg by mouth 2 (two) times daily.  0 03/14/2014  . omeprazole (PRILOSEC) 20 MG capsule Take 1 capsule (20 mg total) by mouth daily. (Patient taking differently: Take 20 mg by mouth daily as needed (Heartburn). ) 30 capsule 2 Past Month at Unknown time  . predniSONE (DELTASONE) 20 MG tablet Take 3 tablets (60 mg total) by mouth daily with breakfast. 90 tablet 0 03/14/2014  . traMADol (ULTRAM) 50 MG tablet Take 1 tablet (50 mg total) by mouth every 8  (eight) hours as needed. (Patient taking differently: Take 50 mg by mouth every 8 (eight) hours as needed for moderate pain. ) 60 tablet 0 Past Week at Unknown time  . amLODipine (NORVASC) 10 MG tablet Take 1 tablet (10 mg total) by mouth daily. 30 tablet 11 03/12/14    Results for orders placed or performed during the hospital encounter of 03/16/14 (from the past 48 hour(s))  Comprehensive metabolic panel     Status: Abnormal   Collection Time: 03/16/14 10:47 AM  Result Value Ref Range   Sodium 140 137 - 147 mEq/L   Potassium 4.6 3.7 - 5.3 mEq/L   Chloride 110 96 - 112 mEq/L   CO2 13 (L) 19 - 32 mEq/L   Glucose, Bld 129 (H) 70 - 99 mg/dL   BUN 39 (H) 6 - 23 mg/dL   Creatinine, Ser 3.39 (H) 0.50 - 1.35 mg/dL   Calcium 8.5 8.4 - 10.5 mg/dL   Total Protein 6.6 6.0 - 8.3 g/dL   Albumin 2.3 (L) 3.5 - 5.2 g/dL   AST 10 0 - 37 U/L   ALT 9 0 - 53 U/L   Alkaline Phosphatase 59 39 - 117 U/L   Total Bilirubin <0.2 (L) 0.3 - 1.2 mg/dL   GFR calc non Af Amer 19 (L) >90 mL/min   GFR calc Af Amer 22 (L) >90 mL/min    Comment: (NOTE) The eGFR has been calculated using the CKD EPI equation. This calculation has not been validated in all clinical situations. eGFR's persistently <90 mL/min signify possible Chronic Kidney Disease.    Anion gap 17 (H) 5 - 15  Lipase, blood     Status: None   Collection Time: 03/16/14 10:47 AM  Result Value Ref Range   Lipase 21 11 - 59 U/L  CBC with Differential     Status: Abnormal   Collection Time: 03/16/14 10:47 AM  Result Value Ref Range   WBC 3.9 (L) 4.0 - 10.5 K/uL   RBC 3.46 (L) 4.22 - 5.81 MIL/uL   Hemoglobin 10.4 (L) 13.0 - 17.0 g/dL   HCT 32.2 (L) 39.0 - 52.0 %   MCV 93.1 78.0 - 100.0 fL   MCH 30.1 26.0 - 34.0 pg   MCHC 32.3 30.0 - 36.0 g/dL   RDW 16.7 (H) 11.5 - 15.5 %   Platelets 59 (L) 150 - 400 K/uL    Comment: SPECIMEN CHECKED FOR CLOTS PLATELET COUNT CONFIRMED BY SMEAR    Neutrophils Relative % 72 43 - 77 %   Neutro Abs 2.8 1.7 - 7.7  K/uL   Lymphocytes Relative 20 12 - 46 %   Lymphs Abs 0.8 0.7 - 4.0 K/uL   Monocytes Relative 7 3 -  12 %   Monocytes Absolute 0.3 0.1 - 1.0 K/uL   Eosinophils Relative 1 0 - 5 %   Eosinophils Absolute 0.0 0.0 - 0.7 K/uL   Basophils Relative 0 0 - 1 %   Basophils Absolute 0.0 0.0 - 0.1 K/uL  Urinalysis, Routine w reflex microscopic     Status: Abnormal   Collection Time: 03/16/14 11:31 AM  Result Value Ref Range   Color, Urine YELLOW YELLOW   APPearance CLOUDY (A) CLEAR   Specific Gravity, Urine 1.019 1.005 - 1.030   pH 5.0 5.0 - 8.0   Glucose, UA NEGATIVE NEGATIVE mg/dL   Hgb urine dipstick LARGE (A) NEGATIVE   Bilirubin Urine NEGATIVE NEGATIVE   Ketones, ur NEGATIVE NEGATIVE mg/dL   Protein, ur 100 (A) NEGATIVE mg/dL   Urobilinogen, UA 0.2 0.0 - 1.0 mg/dL   Nitrite NEGATIVE NEGATIVE   Leukocytes, UA NEGATIVE NEGATIVE  Urine microscopic-add on     Status: Abnormal   Collection Time: 03/16/14 11:31 AM  Result Value Ref Range   Squamous Epithelial / LPF RARE RARE   RBC / HPF 3-6 <3 RBC/hpf   Bacteria, UA RARE RARE   Casts HYALINE CASTS (A) NEGATIVE    Comment: GRANULAR CAST  POC occult blood, ED RN will collect     Status: Abnormal   Collection Time: 03/16/14 11:50 AM  Result Value Ref Range   Fecal Occult Bld POSITIVE (A) NEGATIVE  Clostridium Difficile by PCR     Status: None   Collection Time: 03/16/14 12:02 PM  Result Value Ref Range   C difficile by pcr NEGATIVE NEGATIVE  I-Stat CG4 Lactic Acid, ED     Status: None   Collection Time: 03/16/14  1:55 PM  Result Value Ref Range   Lactic Acid, Venous 0.83 0.5 - 2.2 mmol/L  Basic metabolic panel     Status: Abnormal   Collection Time: 03/16/14  3:07 PM  Result Value Ref Range   Sodium 137 137 - 147 mEq/L   Potassium 4.1 3.7 - 5.3 mEq/L   Chloride 111 96 - 112 mEq/L   CO2 13 (L) 19 - 32 mEq/L   Glucose, Bld 98 70 - 99 mg/dL   BUN 38 (H) 6 - 23 mg/dL   Creatinine, Ser 3.14 (H) 0.50 - 1.35 mg/dL   Calcium 7.3 (L) 8.4  - 10.5 mg/dL   GFR calc non Af Amer 21 (L) >90 mL/min   GFR calc Af Amer 24 (L) >90 mL/min    Comment: (NOTE) The eGFR has been calculated using the CKD EPI equation. This calculation has not been validated in all clinical situations. eGFR's persistently <90 mL/min signify possible Chronic Kidney Disease.    Anion gap 13 5 - 15  CK     Status: None   Collection Time: 03/16/14  8:36 PM  Result Value Ref Range   Total CK 100 7 - 232 U/L  Magnesium     Status: None   Collection Time: 03/16/14  8:36 PM  Result Value Ref Range   Magnesium 1.7 1.5 - 2.5 mg/dL  Phosphorus     Status: None   Collection Time: 03/16/14  8:36 PM  Result Value Ref Range   Phosphorus 3.2 2.3 - 4.6 mg/dL  Basic metabolic panel     Status: Abnormal   Collection Time: 03/17/14  5:00 AM  Result Value Ref Range   Sodium 136 (L) 137 - 147 mEq/L   Potassium 4.1 3.7 - 5.3 mEq/L  Chloride 112 96 - 112 mEq/L   CO2 13 (L) 19 - 32 mEq/L   Glucose, Bld 105 (H) 70 - 99 mg/dL   BUN 36 (H) 6 - 23 mg/dL   Creatinine, Ser 3.03 (H) 0.50 - 1.35 mg/dL   Calcium 7.2 (L) 8.4 - 10.5 mg/dL   GFR calc non Af Amer 22 (L) >90 mL/min   GFR calc Af Amer 25 (L) >90 mL/min    Comment: (NOTE) The eGFR has been calculated using the CKD EPI equation. This calculation has not been validated in all clinical situations. eGFR's persistently <90 mL/min signify possible Chronic Kidney Disease.    Anion gap 11 5 - 15  CBC     Status: Abnormal   Collection Time: 03/17/14  5:00 AM  Result Value Ref Range   WBC 2.4 (L) 4.0 - 10.5 K/uL   RBC 2.40 (L) 4.22 - 5.81 MIL/uL   Hemoglobin 7.4 (L) 13.0 - 17.0 g/dL    Comment: REPEATED TO VERIFY   HCT 22.1 (L) 39.0 - 52.0 %   MCV 92.1 78.0 - 100.0 fL   MCH 30.8 26.0 - 34.0 pg   MCHC 33.5 30.0 - 36.0 g/dL   RDW 16.6 (H) 11.5 - 15.5 %   Platelets 49 (L) 150 - 400 K/uL    Comment: REPEATED TO VERIFY CONSISTENT WITH PREVIOUS RESULT   Protime-INR     Status: Abnormal   Collection Time: 03/17/14  10:15 AM  Result Value Ref Range   Prothrombin Time 17.7 (H) 11.6 - 15.2 seconds   INR 1.44 0.00 - 1.49    Ct Abdomen Pelvis Wo Contrast  03/16/2014   CLINICAL DATA:  Generalized abdominal pain and diarrhea for 1 week, history HIV, hypertension  EXAM: CT ABDOMEN AND PELVIS WITHOUT CONTRAST  TECHNIQUE: Multidetector CT imaging of the abdomen and pelvis was performed following the standard protocol without IV contrast. IV contrast not utilized due to renal dysfunction.  COMPARISON:  01/18/2009  FINDINGS: Minimal atelectasis or scarring at LEFT base.  Perigastric surgical clips with thickening at GE junction question prior antireflux procedure.  Retained contrast within a distended esophagus.  Calcified gallstones dependently in gallbladder.  Within limits of a nonenhanced exam no focal abnormalities of the liver, spleen, pancreas, LEFT kidney, or adrenal glands.  Surgically absent RIGHT kidney.  Colon is diffusely fluid filled and distended consistent with history of diarrhea.  Small bowel loops opacified proximally, opacified distally, normal in caliber without suggestion of obstruction.  Decompressed IVC.  No mass, adenopathy, bowel wall thickening, free fluid, or free air.  Dextro convex thoracolumbar scoliosis.  IMPRESSION: Fluid filled distended colon to the rectum consistent with clinical presentation of diarrhea.  No definite bowel wall thickening or evidence of bowel obstruction.  Prior gastric surgery question anti reflux procedure with contrast distending the esophagus suggesting a degree of obstruction at the GE junction.  Post RIGHT nephrectomy.  No other definite intra-abdominal or intrapelvic abnormalities.   Electronically Signed   By: Lavonia Dana M.D.   On: 03/16/2014 14:50    ROS Blood pressure 120/69, pulse 91, temperature 98.5 F (36.9 C), temperature source Oral, resp. rate 18, height 6' 1" (1.854 m), weight 60.328 kg (133 lb), SpO2 100 %. Physical Exam Physical Examination: General  appearance - oriented to person, place, and time and chronically ill appearing Mental status - alert, oriented to person, place, and time, slow mentation Eyes - pupils equal and reactive, extraocular eye movements intact, funduscopic exam normal, discs flat  and sharp Mouth - mucous membranes moist, pharynx normal without lesions Neck - adenopathy noted PCL Lymphatics - posterior cervical nodes Chest - clear to auscultation, no wheezes, rales or rhonchi, symmetric air entry Heart - S1 and S2 normal, systolic murmur CB7/6 at apex Abdomen - soft, nontender, nondistended, no masses or organomegaly Liver down 3 cm Neurological - alert, oriented, normal speech, no focal findings or movement disorder noted Musculoskeletal - no joint tenderness, deformity or swelling Extremities - peripheral pulses normal, no pedal edema, no clubbing or cyanosis Skin - dry   Assessment/Plan: 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 Anemia  5. Leucopenia 6 Thrombocytopenia 7 Diarrhea  Multiple possibilities for acute syndrome.  One that ties together is Mycophenolate with can cause D, low ptlts, low Hb, and low WBC but not usually in this dose (?interaction with HIV meds however).  First step is to hold it Also in the differential is SLE assoc autoimmune pancytopenia, will check Comp Next in diffential is HIV associated TTP or viral associated ie CMV or other. Last is E. Coli induce D with HUS.(doubt) P stop cellcept, check LDH, coags, comp, antiDS DNA,  Follow count closely, ask pathology to review smear.  , L 03/17/2014, 1:32 PM

## 2014-03-17 NOTE — Progress Notes (Signed)
Family Medicine Teaching Service Daily Progress Note Intern Pager: [phone]  Patient name: Kenneth [name] Medical record number: [id number] Date of birth: 10-06-1955 Age: 58 y.o. Gender: male  Primary Care Provider: Cordelia Poche, MD Consultants: Cardiology Code Status: Full  Pt Overview and Major Events to Date:  12/14 Admitted with diarrhea  Assessment and Plan: Kenneth [name] is a 58 y.o. male presenting with diarrhea and abdominal pain. 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 Differential includes gastroenteritis (unlikely given lack of nausea and vomiting), or inflammatory source (positive FOBT, though patient is without fevers or chills). Given immunocompromised status (HIV, chronic immunosuppression) can consider atypical pathogens such as cryptosporidium or CMV.  C diff infection ruled out. Last colonoscopy in 2008. CT scan without bowel wall thickening suggestive of colitis.  -Full liquid diet, ADAT -NS @ 117ml/hr -f/u stool pathogen panel, stool culture, O&P, cryptosporidium -CK wnl -Endoscopy 2012 for dysphagia with esophageal candidiasis, however symptoms unlikely to be due to yeast at this time.   +FOBT/Anemia. Positive FOBT potentially due to patient's diarrhea. HgB 10.4 on admission. Currently hemodynamically stable. Last colonoscopy in 2008.  -continue home PPI -Consult GI if concern for acute GI bleed or if patient becomes unstable -Continue to trend CBCs -HgB 7.4 on 12/15, f/u PM CBC  PVCs. Patient with several PVCs noted on EKG and with significant ectopy on physical exam. Currently asymptomatic. Potentially related to patient's dehydration. No electrolyte abnormalities. Mg 1.7. -Monitor on telemetry -Cards consulted, appreciate assistance -Change anti-emetic to phenergan from zofran given QTC 494  Pancytopenia. Unclear etiology. Possible  related to patient's HIV or SLE. -Continue to monitor CBCs -Consider further work up if not improving.   HIV. CD4 count of 240 on 02/13/2014 with non detectable viral load. 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 Cr 3.14 on admission (down from 4.65 on discharge last month). -Continue home cellcept and prednisose -IVF as above -Cr improving with IVF  HTN. At goal on admission - Holding home amlodipine given dehydration and normal BPs here - will add back as indicated  HLD. Continue home statin  FEN/GI: Regular diet Prophylaxis: SCDs given thrombocytopenia  Disposition: Admitted to telemetry pending above management  Subjective:  One episode of diarrhea this morning. Abdominal pain improved. No nausea or vomiting. No fevers or chills. 10 pound weight loss since start of diarrhea.   Objective: Temp:  [98.2 F (36.8 C)-100.2 F (37.9 C)] 99.4 F (37.4 C) (12/15 [id number]) Pulse Rate:  [61-127] 61 (12/15 0632) Resp:  [13-26] 20 (12/15 0632) BP: (110-135)/(53-99) 110/53 mmHg (12/15 0632) SpO2:  [99 %-100 %] 99 % (12/15 0632) Weight:  [132 lb (59.875 kg)-133 lb (60.328 kg)] 133 lb (60.328 kg) (12/14 1745) Physical Exam: General: Non-toxic appearing man sitting in hospital bed in NAD Cardiovascular: Several ectopic beats noted, regular rate. No murmurs appreciated Respiratory: NWOB, CTAB, no wheezes appreciated Abdomen: +BS, tender in RUQ, nondistended, no rebound Extremities: No cyanosis or edema  Laboratory:  Recent Labs Lab 03/16/14 1047 03/17/14 0500  WBC 3.9* 2.4*  HGB 10.4* 7.4*  HCT 32.2* 22.1*  PLT 59* 49*    Recent Labs Lab 03/16/14 1047 03/16/14 1507 03/17/14 0500  NA 140 137 136*  K 4.6 4.1 4.1  CL 110 111 112  CO2 13* 13* 13*  BUN  39* 38* 36*  CREATININE 3.39* 3.14* 3.03*  CALCIUM 8.5 7.3* 7.2*  PROT 6.6  --   --   BILITOT <0.2*  --   --   ALKPHOS  59  --   --   ALT 9  --   --   AST 10  --   --   GLUCOSE 129* 98 105*   Mg 1.7 Phos 3.2 CK 100 Lactic acid 0.83 c diff negative  Imaging/Diagnostic Tests: None new  Dimas Chyle, MD 03/17/2014, 8:22 AM PGY-1, [facility] Intern pager: [phone], text pages welcome

## 2014-03-17 NOTE — Consult Note (Signed)
CARDIOLOGY CONSULT NOTE   Patient ID: Kenneth [name] MRN: [id number], DOB/AGE: [birth year]   Admit date: 03/16/2014 Date of Consult: 03/17/2014   Primary Physician: Cordelia Poche, MD Primary Cardiologist: New to  - seen by B. Stanford Breed, MD  Pt. Profile  58 y/o male w/o prior cardiac hx who was admitted with a 1 wk h/o diarrhea and has been found to have frequent pvc's.  Problem List  Past Medical History  Diagnosis Date  . HIV infection     a. 02/13/2014 CD4 = 240 - undetectable viral load.  . Hyperlipidemia   . Hypertension   . Insomnia   . Dysphagia   . Achalasia   . Renal abscess   . Hemorrhoids, internal   . Arthritis   . Premature ventricular contractions   . Pancytopenia   . CKD (chronic kidney disease), stage II   . Lupus nephritis   . SLE (systemic lupus erythematosus)   . Candida infection, esophageal     a. 2012 - noted on EGD.    Past Surgical History  Procedure Laterality Date  . Nephrectomy      Right     Allergies  Allergies  Allergen Reactions  . Amoxicillin     REACTION: diffuse rash    HPI   58 y/o male with the above complex PMH including HIV, SLE, CKD II w/ lupus nephritis, HTN, and HL.  He has no prior cardiac history.  Approximately 1 week ago, he developed diarrhea, which has been persistent since w/o nausea or vomiting.  He presented to the ED yesterday and was admitted for further evaluation.  He was noted to have frequent PVC's on ECG and tele and we've been asked to eval.  He denies any prior h/o palpitations, presyncope, syncope, chest pain , DOE, pnd, orthopnea, or early satiety (though appetite has been poor recently in the setting of diarrhea).  Labs are notable for pancytopenia, creat of 3.03, nl K, and low-nl Mg @ 1.7. Stool has been FOB +.  Inpatient Medications  . abacavir  600 mg Oral Daily  . atorvastatin  40 mg Oral q1800  . dolutegravir  50 mg Oral BID  . lamiVUDine  50 mg Oral Daily  . mycophenolate  500 mg Oral BID   . pantoprazole  40 mg Oral Daily  . predniSONE  60 mg Oral Q breakfast  . sodium chloride  3 mL Intravenous Q12H    Family History Family History  Problem Relation Age of Onset  . Colon cancer Father     deceased  . Other Mother     s/p pacemaker - alive and well.  . Other      5 brothers, 3 sisters - alive and well.     Social History History   Social History  . Marital Status: Single    Spouse Name: N/A    Number of Children: N/A  . Years of Education: N/A   Occupational History  . Not on file.   Social History Main Topics  . Smoking status: Never Smoker   . Smokeless tobacco: Never Used  . Alcohol Use: No  . Drug Use: No  . Sexual Activity: No     Comment: pt. declined condoms   Other Topics Concern  . Not on file   Social History Narrative   Lives in [place] by himself. Does not routinely exercise.     Review of Systems  General:  No chills, fever, night sweats or weight changes.  Cardiovascular:  No chest pain, dyspnea on exertion, edema, orthopnea, palpitations, paroxysmal nocturnal dyspnea. Dermatological: No rash, lesions/masses Respiratory: No cough, dyspnea Urologic: No hematuria, dysuria Abdominal:   No nausea, vomiting, +++ diarrhea, no bright red blood per rectum, melena, or hematemesis Neurologic:  No visual changes, wkns, changes in mental status. All other systems reviewed and are otherwise negative except as noted above.  Physical Exam  Blood pressure 110/53, pulse 61, temperature 99.4 F (37.4 C), temperature source Oral, resp. rate 20, height 6\' 1"  (1.854 m), weight 133 lb (60.328 kg), SpO2 99 %.  General: Pleasant, NAD Psych: Normal affect. Neuro: Alert and oriented X 3. Moves all extremities spontaneously. HEENT: Normal  Neck: Supple without bruits or JVD. Lungs:  Resp regular and unlabored, CTA. Heart: RRR no s3, s4, or murmurs. Abdomen: Soft, non-tender, non-distended, BS + x 4.  Extremities: No clubbing, cyanosis or edema.  DP/PT/Radials 2+ and equal bilaterally.  Labs   Recent Labs  03/16/14 2036  CKTOTAL 100   Lab Results  Component Value Date   WBC 2.4* 03/17/2014   HGB 7.4* 03/17/2014   HCT 22.1* 03/17/2014   MCV 92.1 03/17/2014   PLT 49* 03/17/2014    Recent Labs Lab 03/16/14 1047  03/17/14 0500  NA 140  < > 136*  K 4.6  < > 4.1  CL 110  < > 112  CO2 13*  < > 13*  BUN 39*  < > 36*  CREATININE 3.39*  < > 3.03*  CALCIUM 8.5  < > 7.2*  PROT 6.6  --   --   BILITOT <0.2*  --   --   ALKPHOS 59  --   --   ALT 9  --   --   AST 10  --   --   GLUCOSE 129*  < > 105*  < > = values in this interval not displayed.  Radiology/Studies  Ct Abdomen Pelvis Wo Contrast  03/16/2014   CLINICAL DATA:  Generalized abdominal pain and diarrhea for 1 week, history HIV, hypertension  EXAM: CT ABDOMEN AND PELVIS WITHOUT CONTRAST  TECHNIQUE: Multidetector CT imaging of the abdomen and pelvis was performed following the standard protocol without IV contrast. IV contrast not utilized due to renal dysfunction.   IMPRESSION: Fluid filled distended colon to the rectum consistent with clinical presentation of diarrhea.  No definite bowel wall thickening or evidence of bowel obstruction.  Prior gastric surgery question anti reflux procedure with contrast distending the esophagus suggesting a degree of obstruction at the GE junction.  Post RIGHT nephrectomy.  No other definite intra-abdominal or intrapelvic abnormalities.   Electronically Signed   By: Lavonia Dana M.D.   On: 03/16/2014 14:50   Dg Abd 1 View  02/15/2014   CLINICAL DATA:  Abdominal pain for 2 days.  ICD10: R 10.9  EXAM: ABDOMEN - 1 VIEW   IMPRESSION: No acute findings.   Electronically Signed   By: Abigail Miyamoto M.D.   On: 02/15/2014 16:36   ECG  RSR, 89, pvc's.  ASSESSMENT AND PLAN  1.  Diarrhea:  Improving.  W/U ongoing by IM.  2.  Asymptomatic PVC's:  Frequent ectopy on tele - asymptomatic.  No h/o palpitations, presyncope/syncope, c/p, or DOE.  K+  ok.  Mg low-nl, already supplemented.  Can check echo to r/o structural abnormalities.  3.  Pancytopenia:  Work-up per IM.  4.  FOB + stool:  Work-up per IM.  5.  HIV:  Meds per IM.  6. SLE:  Per IM.  7.  CKD II/Lupus Nephritis:  Stable.  Signed, Murray Hodgkins, NP 03/17/2014, 12:35 PM   As above, patient seen and examined; 58 yo male with chronic renal insufficiency, HIV, SLE admitted with diarrhea for evaluation of PVCs. Patient denies dyspnea, chest pain or palpitations; no history of syncope; K and Mg normal. Will check echocardiogram to evaluate LV function; if normal, no need for further cardiac eval. Other issues per primary care. Kirk Ruths

## 2014-03-17 NOTE — Progress Notes (Signed)
INITIAL NUTRITION ASSESSMENT  DOCUMENTATION CODES Per approved criteria  -Severe malnutrition in the context of chronic illness -Underweight  Pt meets criteria for severe MALNUTRITION in the context of chronic illness as evidenced by moderate to severe severe muscle wasting and 8% weight loss in the past month.  INTERVENTION: - Resource Breeze po TID, each supplement provides 250 kcal and 9 grams of protein  NUTRITION DIAGNOSIS: Inadequate oral intake related to diarrhea as evidenced by wt loss.   Goal: Pt to meet >/= 90% of their estimated nutrition needs   Monitor:  Weight trend, po intake, acceptance of supplements, labs  Reason for Assessment: MST  58 y.o. male  Admitting Dx: Dehydration  ASSESSMENT: 58 y/o male with PMH HIV, HTN, HLD, s/p right nephrectomy, CKD with history of lupus nephritis presents with abdominal pain and diarrhea. Reviewed lab work, imaging, and EKG.   - Pt reports that he is tolerating clear liquids. He reports his usual body weight as 145 lbs within the past month (significant for time frame). He agreed to try nutritional supplements.   Nutrition Focused Physical Exam:  Subcutaneous Fat:  Orbital Region: mild wasting Upper Arm Region: moderate wasting Thoracic and Lumbar Region: moderate wasting  Muscle:  Temple Region: severe wasting Clavicle Bone Region: severe wasting Clavicle and Acromion Bone Region: severe wasting Scapular Bone Region: n/a Dorsal Hand: moderate wasting Patellar Region: moderate wasting Anterior Thigh Region: mild wasting Posterior Calf Region: mild wasting  Edema: none  Height: Ht Readings from Last 1 Encounters:  03/16/14 6\' 1"  (1.854 m)    Weight: Wt Readings from Last 1 Encounters:  03/16/14 133 lb (60.328 kg)    Ideal Body Weight: 79.9 kg  % Ideal Body Weight: 75%  Wt Readings from Last 10 Encounters:  03/16/14 133 lb (60.328 kg)  03/05/14 146 lb (66.225 kg)  02/16/14 158 lb (71.668 kg)   02/02/14 146 lb (66.225 kg)  01/20/14 155 lb 4.8 oz (70.444 kg)  01/02/14 157 lb (71.215 kg)  07/24/13 144 lb (65.318 kg)  03/05/13 158 lb (71.668 kg)  01/14/13 158 lb (71.668 kg)  07/03/12 160 lb (72.576 kg)    Usual Body Weight: 145 lbs  % Usual Body Weight: 92%  BMI:  Body mass index is 17.55 kg/(m^2).  Estimated Nutritional Needs: Kcal: 1800-2000 Protein: 110-120 g Fluid: 2.0 L/day  Skin: intact  Diet Order: Diet clear liquid  EDUCATION NEEDS: -Education needs addressed   Intake/Output Summary (Last 24 hours) at 03/17/14 1443 Last data filed at 03/17/14 1403  Gross per 24 hour  Intake    700 ml  Output    525 ml  Net    175 ml    Last BM: none recorded   Labs:   Recent Labs Lab 03/16/14 1047 03/16/14 1507 03/16/14 2036 03/17/14 0500  NA 140 137  --  136*  K 4.6 4.1  --  4.1  CL 110 111  --  112  CO2 13* 13*  --  13*  BUN 39* 38*  --  36*  CREATININE 3.39* 3.14*  --  3.03*  CALCIUM 8.5 7.3*  --  7.2*  MG  --   --  1.7  --   PHOS  --   --  3.2  --   GLUCOSE 129* 98  --  105*    CBG (last 3)  No results for input(s): GLUCAP in the last 72 hours.  Scheduled Meds: . abacavir  600 mg Oral Daily  . atorvastatin  40 mg Oral q1800  . dolutegravir  50 mg Oral BID  . lamiVUDine  50 mg Oral Daily  . pantoprazole  40 mg Oral Daily  . predniSONE  60 mg Oral Q breakfast  . sodium chloride  3 mL Intravenous Q12H    Continuous Infusions:   Past Medical History  Diagnosis Date  . HIV infection     a. 02/13/2014 CD4 = 240 - undetectable viral load.  . Hyperlipidemia   . Hypertension   . Insomnia   . Dysphagia   . Achalasia   . Renal abscess   . Hemorrhoids, internal   . Arthritis   . Premature ventricular contractions   . Pancytopenia   . CKD (chronic kidney disease), stage II   . Lupus nephritis   . SLE (systemic lupus erythematosus)   . Candida infection, esophageal     a. 2012 - noted on EGD.    Past Surgical History  Procedure  Laterality Date  . Nephrectomy      Right    Laurette Schimke MS, RD, LDN

## 2014-03-17 NOTE — Progress Notes (Signed)
Echo Lab  2D Echocardiogram completed.  Craig, RDCS 03/17/2014 3:09 PM

## 2014-03-18 DIAGNOSIS — I493 Ventricular premature depolarization: Secondary | ICD-10-CM

## 2014-03-18 DIAGNOSIS — D61818 Other pancytopenia: Secondary | ICD-10-CM

## 2014-03-18 DIAGNOSIS — E872 Acidosis, unspecified: Secondary | ICD-10-CM | POA: Insufficient documentation

## 2014-03-18 DIAGNOSIS — D72819 Decreased white blood cell count, unspecified: Secondary | ICD-10-CM

## 2014-03-18 DIAGNOSIS — B20 Human immunodeficiency virus [HIV] disease: Principal | ICD-10-CM

## 2014-03-18 DIAGNOSIS — E860 Dehydration: Secondary | ICD-10-CM

## 2014-03-18 DIAGNOSIS — D696 Thrombocytopenia, unspecified: Secondary | ICD-10-CM

## 2014-03-18 DIAGNOSIS — R197 Diarrhea, unspecified: Secondary | ICD-10-CM

## 2014-03-18 DIAGNOSIS — D649 Anemia, unspecified: Secondary | ICD-10-CM

## 2014-03-18 LAB — COMPREHENSIVE METABOLIC PANEL
ALBUMIN: 1.7 g/dL — AB (ref 3.5–5.2)
ALK PHOS: 47 U/L (ref 39–117)
ALT: 7 U/L (ref 0–53)
ANION GAP: 11 (ref 5–15)
AST: 10 U/L (ref 0–37)
BUN: 35 mg/dL — AB (ref 6–23)
CALCIUM: 7.4 mg/dL — AB (ref 8.4–10.5)
CO2: 11 mEq/L — ABNORMAL LOW (ref 19–32)
Chloride: 111 mEq/L (ref 96–112)
Creatinine, Ser: 2.9 mg/dL — ABNORMAL HIGH (ref 0.50–1.35)
GFR calc Af Amer: 26 mL/min — ABNORMAL LOW (ref >90)
GFR calc non Af Amer: 23 mL/min — ABNORMAL LOW (ref >90)
Glucose, Bld: 121 mg/dL — ABNORMAL HIGH (ref 70–99)
POTASSIUM: 4.3 meq/L (ref 3.7–5.3)
Sodium: 133 mEq/L — ABNORMAL LOW (ref 137–147)
Total Bilirubin: <0.2 mg/dL — ABNORMAL LOW (ref 0.3–1.2)
Total Protein: 4.8 g/dL — ABNORMAL LOW (ref 6.0–8.3)

## 2014-03-18 LAB — DIFFERENTIAL
BASOS ABS: 0 10*3/uL (ref 0.0–0.1)
Basophils Relative: 0 % (ref 0–1)
EOS ABS: 0 10*3/uL (ref 0.0–0.7)
Eosinophils Relative: 0 % (ref 0–5)
Lymphocytes Relative: 14 % (ref 12–46)
Lymphs Abs: 0.5 10*3/uL — ABNORMAL LOW (ref 0.7–4.0)
Monocytes Absolute: 0.3 10*3/uL (ref 0.1–1.0)
Monocytes Relative: 8 % (ref 3–12)
NEUTROS ABS: 2.5 10*3/uL (ref 1.7–7.7)
NEUTROS PCT: 77 % (ref 43–77)

## 2014-03-18 LAB — GI PATHOGEN PANEL BY PCR, STOOL
C difficile toxin A/B: NEGATIVE
Campylobacter by PCR: NEGATIVE
Cryptosporidium by PCR: NEGATIVE
E coli (ETEC) LT/ST: NEGATIVE
E coli (STEC): NEGATIVE
E coli 0157 by PCR: NEGATIVE
G lamblia by PCR: NEGATIVE
NOROVIRUS G1/G2: NEGATIVE
Rotavirus A by PCR: NEGATIVE
SHIGELLA BY PCR: NEGATIVE
Salmonella by PCR: NEGATIVE

## 2014-03-18 LAB — OVA AND PARASITE EXAMINATION: Ova and parasites: NONE SEEN

## 2014-03-18 LAB — HIV-1 RNA QUANT-NO REFLEX-BLD
HIV 1 RNA Quant: 109 copies/mL — ABNORMAL HIGH (ref <20)
HIV-1 RNA Quant, Log: 2.04 {Log} — ABNORMAL HIGH (ref <1.30)

## 2014-03-18 LAB — CBC
HCT: 22.5 % — ABNORMAL LOW (ref 39.0–52.0)
Hemoglobin: 7.4 g/dL — ABNORMAL LOW (ref 13.0–17.0)
MCH: 30.8 pg (ref 26.0–34.0)
MCHC: 32.9 g/dL (ref 30.0–36.0)
MCV: 93.8 fL (ref 78.0–100.0)
Platelets: 56 10*3/uL — ABNORMAL LOW (ref 150–400)
RBC: 2.4 MIL/uL — ABNORMAL LOW (ref 4.22–5.81)
RDW: 16.6 % — ABNORMAL HIGH (ref 11.5–15.5)
WBC: 3.2 10*3/uL — ABNORMAL LOW (ref 4.0–10.5)

## 2014-03-18 LAB — CRYPTOSPORIDIUM SMEAR, FECAL: CRYPTOSPORIDIUM SMEAR.: NONE SEEN

## 2014-03-18 LAB — ANTI-DNA ANTIBODY, DOUBLE-STRANDED: DS DNA AB: 171 [IU]/mL — AB

## 2014-03-18 LAB — C4 COMPLEMENT: COMPLEMENT C4, BODY FLUID: 13 mg/dL — AB (ref 10–40)

## 2014-03-18 LAB — HAPTOGLOBIN: Haptoglobin: 232 mg/dL — ABNORMAL HIGH (ref 45–215)

## 2014-03-18 LAB — T-HELPER CELLS (CD4) COUNT (NOT AT ARMC)
CD4 % Helper T Cell: 27 % — ABNORMAL LOW (ref 33–55)
CD4 T Cell Abs: 100 /uL — ABNORMAL LOW (ref 400–2700)

## 2014-03-18 LAB — PHOSPHORUS: Phosphorus: 3.8 mg/dL (ref 2.3–4.6)

## 2014-03-18 LAB — C3 COMPLEMENT: C3 Complement: 45 mg/dL — ABNORMAL LOW (ref 90–180)

## 2014-03-18 MED ORDER — SODIUM BICARBONATE 650 MG PO TABS
650.0000 mg | ORAL_TABLET | Freq: Two times a day (BID) | ORAL | Status: DC
  Administered 2014-03-18 – 2014-03-19 (×4): 650 mg via ORAL
  Filled 2014-03-18 (×6): qty 1

## 2014-03-18 MED ORDER — STERILE WATER FOR INJECTION IV SOLN
INTRAVENOUS | Status: DC
  Administered 2014-03-18 – 2014-03-20 (×3): via INTRAVENOUS
  Filled 2014-03-18 (×5): qty 850

## 2014-03-18 MED ORDER — SODIUM CHLORIDE 0.9 % IV SOLN
INTRAVENOUS | Status: DC
  Administered 2014-03-18: 09:00:00 via INTRAVENOUS

## 2014-03-18 MED ORDER — LAMIVUDINE 10 MG/ML PO SOLN
100.0000 mg | Freq: Every day | ORAL | Status: DC
  Administered 2014-03-19: 100 mg via ORAL
  Filled 2014-03-18 (×2): qty 10

## 2014-03-18 MED ORDER — ABACAVIR SULFATE 300 MG PO TABS
300.0000 mg | ORAL_TABLET | Freq: Two times a day (BID) | ORAL | Status: DC
  Administered 2014-03-19 (×2): 300 mg via ORAL
  Filled 2014-03-18 (×4): qty 1

## 2014-03-18 NOTE — Progress Notes (Addendum)
Family Medicine Teaching Service Daily Progress Note Intern Pager: [phone]  Patient name: Kenneth [name] Medical record number: [id number] Date of birth: 1955/12/31 Age: 58 y.o. Gender: male  Primary Care Provider: Cordelia Poche, MD Consultants: Cardiology Code Status: Full  Pt Overview and Major Events to Date:  12/14 Admitted with diarrhea  Assessment and Plan: Kenneth [name] is a 58 y.o. male presenting with diarrhea and abdominal pain. 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 Differential includes gastroenteritis (unlikely given lack of nausea and vomiting), or inflammatory source (positive FOBT, though patient is without fevers or chills). Given immunocompromised status (HIV, chronic immunosuppression) can consider atypical pathogens such as cryptosporidium or CMV.  C diff infection ruled out. Last colonoscopy in 2008. CT scan without bowel wall thickening suggestive of colitis.  -Full liquid diet, ADAT -NS @ 87ml/hr -f/u stool pathogen panel, stool culture, O&P, cryptosporidium  +FOBT/Anemia. Positive FOBT potentially due to patient's diarrhea. HgB 10.4 on admission. Baseline 8-11. Currently hemodynamically stable. Last colonoscopy in 2008.  -continue home PPI -Consult GI if concern for acute GI bleed or if patient becomes unstable -Continue to trend CBCs  PVCs. Patient with several PVCs noted on EKG and with significant ectopy on physical exam. Currently asymptomatic. Potentially related to patient's dehydration. No electrolyte abnormalities. Mg 1.7. -Monitor on telemetry -Cards consulted, appreciate assistance -Change anti-emetic to phenergan from zofran given QTC 494 -Echo with EF 99991111, grade 1 diastolic dysfunction  Pancytopenia. Unclear etiology. Possible related to patient's HIV or SLE. Additionally possibly related to patient's  cellcept. -Continue to monitor CBCs -Renal following along, appreciate assistance.  -Holding cellcept -Haptoglobin 232 -f/u Anti-dsDNA ab -f/u smear  HIV. CD4 count of 240 on 02/13/2014 with non detectable viral load. 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 Cr 3.14 on admission (down from 4.65 on discharge last month). -Continue home prednisose -Holding home cellcept as above -IVF as above -Cr improving with IVF  HTN. At goal on admission - Holding home amlodipine given dehydration and normal BPs here - will add back as indicated  HLD. Continue home statin  FEN/GI: Regular diet Prophylaxis: SCDs given thrombocytopenia  Disposition: Admitted to telemetry pending above management  Subjective:  Two episodes of diarrhea last night. Some nausea, no vomiting. No fevers or chills. Abdominal pain with bowel movements yesterday. Wants to try advancing diet today.   Objective: Temp:  [98.2 F (36.8 C)-99.3 F (37.4 C)] 98.2 F (36.8 C) (12/16 0617) Pulse Rate:  [73-95] 73 (12/16 0617) Resp:  [16-22] 16 (12/16 0617) BP: (116-120)/(65-77) 116/65 mmHg (12/16 0617) SpO2:  [100 %] 100 % (12/16 0617) Physical Exam: General: Non-toxic appearing man sitting in hospital bed in NAD Cardiovascular: Several ectopic beats noted, regular rate. No murmurs appreciated Respiratory: NWOB, CTAB, no wheezes appreciated Abdomen: +BS, tender in RUQ, nondistended, no rebound Extremities: No cyanosis or edema  Laboratory:  Recent Labs Lab 03/17/14 0500 03/17/14 1610 03/18/14 0443  WBC 2.4* 2.1* 3.2*  HGB 7.4* 7.6* 7.4*  HCT 22.1* 22.9* 22.5*  PLT 49* 54* 56*    Recent Labs Lab 03/16/14 1047 03/16/14 1507 03/17/14 0500 03/18/14 0443  NA 140 137 136* 133*  K 4.6 4.1 4.1 4.3  CL 110 111 112 111  CO2 13* 13* 13* 11*  BUN 39* 38* 36* 35*  CREATININE 3.39* 3.14* 3.03* 2.90*  CALCIUM  8.5 7.3* 7.2* 7.4*  PROT 6.6  --   --  4.8*  BILITOT <0.2*  --   --  <0.2*  ALKPHOS 59  --   --  47  ALT 9  --   --  7  AST 10  --   --  10  GLUCOSE 129* 98 105* 121*   Mg 1.7 Phos 3.2 CK 100 Lactic acid 0.83 c diff negative Haptoglobin 232 C4 45, C3 13 INR 1.50  Imaging/Diagnostic Tests: None new  Dimas Chyle, MD 03/18/2014, 8:27 AM PGY-1, [place] Intern pager: [phone], text pages welcome

## 2014-03-18 NOTE — Progress Notes (Signed)
Subjective: Interval History: has complaints , still cramps with D.  Objective: Vital signs in last 24 hours: Temp:  [98.2 F (36.8 C)-99.3 F (37.4 C)] 98.2 F (36.8 C) (12/16 0617) Pulse Rate:  [73-95] 73 (12/16 0617) Resp:  [16-22] 16 (12/16 0617) BP: (116-120)/(65-77) 116/65 mmHg (12/16 0617) SpO2:  [100 %] 100 % (12/16 0617) Weight change:   Intake/Output from previous day: 12/15 0701 - 12/16 0700 In: 1000 [P.O.:1000] Out: 550 [Urine:550] Intake/Output this shift: Total I/O In: 120 [I.V.:120] Out: -   General appearance: alert, cooperative, no distress and pale Resp: clear to auscultation bilaterally Cardio: S1, S2 normal and systolic murmur: holosystolic 2/6, blowing at apex GI: pos bs, liver down 6 cm Extremities: extremities normal, atraumatic, no cyanosis or edema  Lab Results:  Recent Labs  03/17/14 1610 03/18/14 0443  WBC 2.1* 3.2*  HGB 7.6* 7.4*  HCT 22.9* 22.5*  PLT 54* 56*   BMET:  Recent Labs  03/17/14 0500 03/18/14 0443  NA 136* 133*  K 4.1 4.3  CL 112 111  CO2 13* 11*  GLUCOSE 105* 121*  BUN 36* 35*  CREATININE 3.03* 2.90*  CALCIUM 7.2* 7.4*   No results for input(s): PTH in the last 72 hours. Iron Studies: No results for input(s): IRON, TIBC, TRANSFERRIN, FERRITIN in the last 72 hours.  Studies/Results: Ct Abdomen Pelvis Wo Contrast  03/16/2014   CLINICAL DATA:  Generalized abdominal pain and diarrhea for 1 week, history HIV, hypertension  EXAM: CT ABDOMEN AND PELVIS WITHOUT CONTRAST  TECHNIQUE: Multidetector CT imaging of the abdomen and pelvis was performed following the standard protocol without IV contrast. IV contrast not utilized due to renal dysfunction.  COMPARISON:  01/18/2009  FINDINGS: Minimal atelectasis or scarring at LEFT base.  Perigastric surgical clips with thickening at GE junction question prior antireflux procedure.  Retained contrast within a distended esophagus.  Calcified gallstones dependently in gallbladder.   Within limits of a nonenhanced exam no focal abnormalities of the liver, spleen, pancreas, LEFT kidney, or adrenal glands.  Surgically absent RIGHT kidney.  Colon is diffusely fluid filled and distended consistent with history of diarrhea.  Small bowel loops opacified proximally, opacified distally, normal in caliber without suggestion of obstruction.  Decompressed IVC.  No mass, adenopathy, bowel wall thickening, free fluid, or free air.  Dextro convex thoracolumbar scoliosis.  IMPRESSION: Fluid filled distended colon to the rectum consistent with clinical presentation of diarrhea.  No definite bowel wall thickening or evidence of bowel obstruction.  Prior gastric surgery question anti reflux procedure with contrast distending the esophagus suggesting a degree of obstruction at the GE junction.  Post RIGHT nephrectomy.  No other definite intra-abdominal or intrapelvic abnormalities.   Electronically Signed   By: Lavonia Dana M.D.   On: 03/16/2014 14:50    I have reviewed the patient's current medications.  Assessment/Plan: 1 SLE Nephritis  Lupus parameters better. Cr improved but severe acidemia from D and renal.  Needs bicarb. 2 Pancytopenia smear Pending Hapto not low.  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

## 2014-03-18 NOTE — Progress Notes (Addendum)
Patient stated he takes his Abacavir in two divided doses daily at home. "I take 300mg  in the morning and 300mg  around 6isht". MD notified stated they will look into the medication.

## 2014-03-18 NOTE — Consult Note (Signed)
[place] for Infectious Disease     Reason for Consult: HIV, diarrhea, pancytopenia    Referring Physician: Dr. Nori Riis  Principal Problem:   Dehydration Active Problems:   HIV disease   Hyperlipidemia   Anemia   Essential hypertension   Lupus nephritis   Abdominal pain   Diarrhea   Abdominal pain, acute   PVC's (premature ventricular contractions)   Metabolic acidosis   . [START ON 03/19/2014] abacavir  300 mg Oral BID  . atorvastatin  40 mg Oral q1800  . dolutegravir  50 mg Oral BID  . feeding supplement (RESOURCE BREEZE)  1 Container Oral TID BM  . [START ON 03/19/2014] lamiVUDine  100 mg Oral Daily  . pantoprazole  40 mg Oral Daily  . predniSONE  60 mg Oral Q breakfast  . sodium bicarbonate  650 mg Oral BID  . sodium chloride  3 mL Intravenous Q12H    Recommendations: I will increase lamivudine to 100 mg daily   Assessment: He has predominately thromboctyopenia with more anemia and leukopenia.  Also CD4 is 100 though this is a result of being on prednisone and is transient.   Suspect related to recent addition of Cellcept.  Had been on HIV regimen since April 2015 for HIV but also possible cause.    Antibiotics: none  HPI: Kenneth [name] is a 58 y.o. 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  Came in to ED with one week of diarrhea, 5-6 per day.  Noted pancytopenia.  No n/v.  No cough, sob.  No fever, no chills. Crypto negative, C diff negative.    Review of Systems: A comprehensive review of systems was negative.  Past Medical History  Diagnosis Date  . HIV infection     a. 02/13/2014 CD4 = 240 - undetectable viral load.  . Hyperlipidemia   . Hypertension   . Insomnia   . Dysphagia   . Achalasia   . Renal abscess   . Hemorrhoids, internal   . Arthritis   . Premature ventricular contractions   . Pancytopenia   . CKD (chronic kidney disease), stage II   . Lupus nephritis   . SLE  (systemic lupus erythematosus)   . Candida infection, esophageal     a. 2012 - noted on EGD.    History  Substance Use Topics  . Smoking status: Never Smoker   . Smokeless tobacco: Never Used  . Alcohol Use: No    Family History  Problem Relation Age of Onset  . Colon cancer Father     deceased  . Other Mother     s/p pacemaker - alive and well.  . Other      5 brothers, 3 sisters - alive and well.   Allergies  Allergen Reactions  . Amoxicillin     REACTION: diffuse rash    OBJECTIVE: Blood pressure 125/76, pulse 76, temperature 98.2 F (36.8 C), temperature source Oral, resp. rate 18, height 6\' 1"  (1.854 m), weight 133 lb (60.328 kg), SpO2 100 %. General: awake, alert, nad Skin: no rashes Lungs: CTA B Cor: RRR Abdomen: soft, nt, nd   Microbiology: Recent Results (from the past 240 hour(s))  Clostridium Difficile by PCR     Status: None   Collection Time: 03/16/14 12:02 PM  Result Value Ref Range Status   C difficile by pcr NEGATIVE NEGATIVE Final  Cryptosporidium Smear, Fecal     Status: None  Collection Time: 03/17/14  9:21 PM  Result Value Ref Range Status   Specimen Description STOOL  Final   Special Requests NONE  Final   Cryptosporidium Smear.   Final    NO Cryptosporidium Cyclospora or Isospora seen. Performed at [facility]    Report Status 03/18/2014 FINAL  Final    Scharlene Gloss, [place] for Infectious Disease [facility] [email or web address] [phone] pager  [phone] cell 03/18/2014, 3:02 PM

## 2014-03-18 NOTE — Progress Notes (Signed)
2D echo reviewed with Dr. [place]. EF 50-55%, no WMA, grade 1 d/d. No further cardiac evaluation needed at present time. Will ask nursing to notify pt of echo result. Will s/o, please call with questions.  Dayna Dunn PA-C

## 2014-03-18 NOTE — Clinical Documentation Improvement (Signed)
Possible Clinical Conditions?  Severe Malnutrition   Protein Calorie Malnutrition Severe Protein Calorie Malnutrition Other Condition Cannot clinically determine  Supporting Information:(As per INITIAL NUTRITION ASSESSMENT by Dorann Ou, RD at 04-15-14)  [place]  Per approved criteria   -Severe malnutrition in the context of chronic illness  -Underweight   Pt meets criteria for severe MALNUTRITION in the context of chronic illness as evidenced by moderate to severe severe muscle wasting and 8% weight loss in the past month.   INTERVENTION:  - Resource Breeze po TID, each supplement provides 250 kcal and 9 grams of protein   NUTRITION DIAGNOSIS:  Inadequate oral intake related to diarrhea as evidenced by wt loss.  Thank You, Alessandra Grout, RN, BSN, CCDS,Clinical Documentation Specialist:  [phone]  [phone]=[facility] [place]- Health Information Management

## 2014-03-19 LAB — COMPREHENSIVE METABOLIC PANEL
ALK PHOS: 48 U/L (ref 39–117)
ALT: 6 U/L (ref 0–53)
AST: 8 U/L (ref 0–37)
Albumin: 1.6 g/dL — ABNORMAL LOW (ref 3.5–5.2)
Anion gap: 12 (ref 5–15)
BUN: 40 mg/dL — ABNORMAL HIGH (ref 6–23)
CHLORIDE: 106 meq/L (ref 96–112)
CO2: 18 meq/L — AB (ref 19–32)
Calcium: 7.4 mg/dL — ABNORMAL LOW (ref 8.4–10.5)
Creatinine, Ser: 2.92 mg/dL — ABNORMAL HIGH (ref 0.50–1.35)
GFR calc Af Amer: 26 mL/min — ABNORMAL LOW (ref >90)
GFR, EST NON AFRICAN AMERICAN: 23 mL/min — AB (ref >90)
Glucose, Bld: 112 mg/dL — ABNORMAL HIGH (ref 70–99)
POTASSIUM: 4 meq/L (ref 3.7–5.3)
SODIUM: 136 meq/L — AB (ref 137–147)
Total Protein: 4.5 g/dL — ABNORMAL LOW (ref 6.0–8.3)

## 2014-03-19 LAB — CBC
HCT: 20.5 % — ABNORMAL LOW (ref 39.0–52.0)
Hemoglobin: 7 g/dL — ABNORMAL LOW (ref 13.0–17.0)
MCH: 30.8 pg (ref 26.0–34.0)
MCHC: 34.1 g/dL (ref 30.0–36.0)
MCV: 90.3 fL (ref 78.0–100.0)
PLATELETS: 62 10*3/uL — AB (ref 150–400)
RBC: 2.27 MIL/uL — AB (ref 4.22–5.81)
RDW: 16.6 % — ABNORMAL HIGH (ref 11.5–15.5)
WBC: 2.8 10*3/uL — AB (ref 4.0–10.5)

## 2014-03-19 LAB — PHOSPHORUS: Phosphorus: 4.1 mg/dL (ref 2.3–4.6)

## 2014-03-19 LAB — PATHOLOGIST SMEAR REVIEW

## 2014-03-19 NOTE — Progress Notes (Signed)
Subjective: Interval History: has no complaint, diarr getting better, but still present.  Objective: Vital signs in last 24 hours: Temp:  [98.2 F (36.8 C)-98.6 F (37 C)] 98.6 F (37 C) (12/17 0549) Pulse Rate:  [71-76] 71 (12/17 0549) Resp:  [4-18] 4 (12/17 0549) BP: (114-143)/(73-77) 114/73 mmHg (12/17 0549) SpO2:  [99 %-100 %] 99 % (12/17 0549) Weight change:   Intake/Output from previous day: 12/16 0701 - 12/17 0700 In: 2858.3 [P.O.:760; I.V.:2098.3] Out: -  Intake/Output this shift: Total I/O In: 18.3 [I.V.:18.3] Out: -   General appearance: alert, cooperative and no distress Resp: clear to auscultation bilaterally Cardio: S1, S2 normal and systolic murmur: holosystolic 2/6, blowing at apex GI: soft, nontender, active bs,.liver down 4 cm Extremities: extremities normal, atraumatic, no cyanosis or edema  Lab Results:  Recent Labs  03/18/14 0443 03/19/14 0635  WBC 3.2* 2.8*  HGB 7.4* 7.0*  HCT 22.5* 20.5*  PLT 56* 62*   BMET:  Recent Labs  03/18/14 0443 03/19/14 0635  NA 133* 136*  K 4.3 4.0  CL 111 106  CO2 11* 18*  GLUCOSE 121* 112*  BUN 35* 40*  CREATININE 2.90* 2.92*  CALCIUM 7.4* 7.4*   No results for input(s): PTH in the last 72 hours. Iron Studies: No results for input(s): IRON, TIBC, TRANSFERRIN, FERRITIN in the last 72 hours.  Studies/Results: No results found.  I have reviewed the patient's current medications.  Assessment/Plan: 1  SLE nephritis  CR stable, acid/base better with  Bicarb suppl and less loss in stool.   2 D w/u nonrevealing thus far. Not typical for mycophenolate with this severity, suspect is related to that but plus HIV.  Can consider Myfortic (enteric slow absorb) in lower doses in future, less D 3 Pancytopenia stable, ? Mostly mycophenolate but ?? Interaction with HIV meds. 4 SLE need to cont Pred 1 mg/kg 5 HIV per ID 6 Hydration adequate P bicarb cont, cont Pred , lower ivf    LOS: 3 days   Jonuel Butterfield  L 03/19/2014,8:44 AM

## 2014-03-19 NOTE — Care Management Note (Unsigned)
    Page 1 of 1   03/19/2014     4:36:14 PM CARE MANAGEMENT NOTE 03/19/2014  Patient:  Kenneth [name], Kenneth [name]   Account Number:  [id number]  Date Initiated:  03/19/2014  Documentation initiated by:  Tomi Bamberger  Subjective/Objective Assessment:   dx dehydration  admit- lives alone.     Action/Plan:   Anticipated DC Date:  03/20/2014   Anticipated DC Plan:  [facility]  CM consult      Choice offered to / List presented to:             Status of service:   Medicare Important Message given?  NO (If response is "NO", the following Medicare IM given date fields will be blank) Date Medicare IM given:   Medicare IM given by:   Date Additional Medicare IM given:   Additional Medicare IM given by:    Discharge Disposition:    Per UR Regulation:  Reviewed for med. necessity/level of care/duration of stay  If discussed at [place] of Stay Meetings, dates discussed:    Comments:  03/19/14 [place], BSN [phone]  NCM received referral for med ast, NCM spoke with patient, he states he does not need help with his meds.  NCM spoke with RN and MD to see if patient was started on any new meds, they stated no.  Patient will have follow up with [name] for [phone].

## 2014-03-19 NOTE — Progress Notes (Signed)
Family Medicine Teaching Service Daily Progress Note Intern Pager: [phone]  Patient name: Kenneth [name] Medical record number: [id number] Date of birth: [birth year] Age: 58 y.o. Gender: male  Primary Care Provider: Cordelia Poche, MD Consultants: Cardiology Code Status: Full  Pt Overview and Major Events to Date:  12/14 Admitted with diarrhea  Assessment and Plan: Kenneth [name] is a 58 y.o. male presenting with diarrhea and abdominal pain. 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 Differential includes gastroenteritis (unlikely given lack of nausea and vomiting), inflammatory source (positive FOBT, though patient is without fevers or chills), or medication induced (cellcept, HIV meds). Can consider CMV (given h/o HIV), though no signs of colitis on abdominal CT. C diff infection ruled out. Last colonoscopy in 2008.  -Soft diet, ADAT -IVF -Stool pathogen panel, stool culture, O&P, and cryptosporidium smear negative  +FOBT/Anemia. Positive FOBT potentially due to patient's diarrhea. HgB 10.4 on admission. Baseline 8-11. Currently hemodynamically stable. Last colonoscopy in 2008.  -continue home PPI -Consult GI if concern for acute GI bleed or if patient becomes unstable -Continue to trend CBCs  Pancytopenia. Unclear etiology. Possible related to patient's HIV or SLE. Additionally possibly related to patient's cellcept. -Continue to monitor CBCs -Renal following along, appreciate assistance.  -Holding cellcept -Anti-dsDNA Ab elevated to 171 (610 in November) -f/u smear  PVCs. Patient with several PVCs noted on EKG and with significant ectopy on physical exam. Currently asymptomatic.Mg 1.7. -Monitor on telemetry -Cards consulted, appreciate assistance - no further work up indicated at this time -Change anti-emetic to phenergan from zofran given QTC 494 -Echo  with EF 99991111, grade 1 diastolic dysfunction  HIV. CD4 count of 240 on 02/13/2014 with non detectable viral load. CD4 count 100, HIV quant 109.  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 Cr 3.14 on admission (down from 4.65 on discharge last month). -Continue home prednisone -Holding home cellcept as above -IVF as above -Cr improving with IVF  HTN. At goal on admission - Holding home amlodipine given dehydration and normal BPs here - will add back as indicated  HLD. Continue home statin  Severe Protein Calorie Malnutrition.  -Nutrition following, appreciate assistance.  -Breeze PO TID  FEN/GI: Soft diet, bicarb 147mEq in sterile water @100cc /hr Prophylaxis: SCDs given thrombocytopenia  Disposition: Admitted to telemetry pending above management  Subjective:  No episodes of diarrhea since yesterday. Feeling a little better this morning. No chest pain, shortness of breath, fevers, chills, or palpitations.   Objective: Temp:  [98.2 F (36.8 C)-98.6 F (37 C)] 98.6 F (37 C) (12/17 0549) Pulse Rate:  [71-76] 71 (12/17 0549) Resp:  [4-18] 4 (12/17 0549) BP: (114-143)/(73-77) 114/73 mmHg (12/17 0549) SpO2:  [99 %-100 %] 99 % (12/17 0549) Physical Exam: General: Non-toxic appearing man sitting in hospital bed in NAD Cardiovascular: Several ectopic beats noted, regular rate. No murmurs appreciated Respiratory: NWOB, CTAB, no wheezes appreciated Abdomen: +BS, tender in RUQ, nondistended, no rebound Extremities: No cyanosis or edema  Laboratory:  Recent Labs Lab 03/17/14 1610 03/18/14 0443 03/19/14 0635  WBC 2.1* 3.2* 2.8*  HGB 7.6* 7.4* 7.0*  HCT 22.9* 22.5* 20.5*  PLT 54* 56* 62*    Recent Labs Lab 03/16/14 1047 03/16/14 1507 03/17/14 0500 03/18/14 0443  NA 140 137 136* 133*  K 4.6 4.1 4.1 4.3  CL 110 111 112  111  CO2 13* 13* 13* 11*  BUN 39* 38* 36* 35*  CREATININE 3.39* 3.14*  3.03* 2.90*  CALCIUM 8.5 7.3* 7.2* 7.4*  PROT 6.6  --   --  4.8*  BILITOT <0.2*  --   --  <0.2*  ALKPHOS 59  --   --  47  ALT 9  --   --  7  AST 10  --   --  10  GLUCOSE 129* 98 105* 121*   Mg 1.7 Phos 3.2 CK 100 Lactic acid 0.83 c diff negative Haptoglobin 232 C4 45, C3 13 INR 1.50 Anti dsDNA Ab 171   Imaging/Diagnostic Tests: None new  Dimas Chyle, MD 03/19/2014, 8:23 AM PGY-1, [place] Intern pager: [phone], text pages welcome

## 2014-03-20 LAB — RENAL FUNCTION PANEL
Albumin: 1.7 g/dL — ABNORMAL LOW (ref 3.5–5.2)
Anion gap: 11 (ref 5–15)
BUN: 41 mg/dL — AB (ref 6–23)
CHLORIDE: 104 meq/L (ref 96–112)
CO2: 24 mEq/L (ref 19–32)
CREATININE: 2.77 mg/dL — AB (ref 0.50–1.35)
Calcium: 7.5 mg/dL — ABNORMAL LOW (ref 8.4–10.5)
GFR calc Af Amer: 28 mL/min — ABNORMAL LOW (ref >90)
GFR calc non Af Amer: 24 mL/min — ABNORMAL LOW (ref >90)
Glucose, Bld: 109 mg/dL — ABNORMAL HIGH (ref 70–99)
PHOSPHORUS: 4 mg/dL (ref 2.3–4.6)
Potassium: 3.8 mEq/L (ref 3.7–5.3)
Sodium: 139 mEq/L (ref 137–147)

## 2014-03-20 LAB — CBC
HEMATOCRIT: 21.5 % — AB (ref 39.0–52.0)
Hemoglobin: 7.4 g/dL — ABNORMAL LOW (ref 13.0–17.0)
MCH: 31.6 pg (ref 26.0–34.0)
MCHC: 34.4 g/dL (ref 30.0–36.0)
MCV: 91.9 fL (ref 78.0–100.0)
Platelets: 77 10*3/uL — ABNORMAL LOW (ref 150–400)
RBC: 2.34 MIL/uL — ABNORMAL LOW (ref 4.22–5.81)
RDW: 16.7 % — AB (ref 11.5–15.5)
WBC: 2.1 10*3/uL — ABNORMAL LOW (ref 4.0–10.5)

## 2014-03-20 MED ORDER — SULFAMETHOXAZOLE-TRIMETHOPRIM 800-160 MG PO TABS
1.0000 | ORAL_TABLET | ORAL | Status: DC
  Administered 2014-03-20: 1 via ORAL
  Filled 2014-03-20: qty 1

## 2014-03-20 MED ORDER — LAMIVUDINE 10 MG/ML PO SOLN: 100.0000 mg | Freq: Every day | ORAL | Status: DC

## 2014-03-20 MED ORDER — LORAZEPAM 0.5 MG PO TABS
0.5000 mg | ORAL_TABLET | Freq: Four times a day (QID) | ORAL | Status: DC | PRN
  Administered 2014-03-20: 0.5 mg via SUBLINGUAL
  Filled 2014-03-20: qty 1

## 2014-03-20 MED ORDER — SULFAMETHOXAZOLE-TRIMETHOPRIM 800-160 MG PO TABS: 1.0000 | ORAL_TABLET | ORAL | Status: DC

## 2014-03-20 MED ORDER — SODIUM BICARBONATE 650 MG PO TABS: 1300.0000 mg | ORAL_TABLET | Freq: Two times a day (BID) | ORAL | Status: DC

## 2014-03-20 NOTE — Progress Notes (Signed)
Patient discharge instruction given patient verbalized understanding using teach back. IV removed. Patient has belongings. ( cell phone glasses and clothing).

## 2014-03-20 NOTE — Progress Notes (Addendum)
Pt. unable to take his po meds this am because of vomiting.  States, " feels like something is getting stuck in his throat".  Paged MD at  [phone].  Will continue to monitor and await return call.  Alphonzo Lemmings, RN

## 2014-03-20 NOTE — Progress Notes (Signed)
    [facility] for Infectious Disease  Date of Admission:  03/16/2014  Antibiotics: ARVs  Subjective: Feels better, still with diarrhea  Objective: Temp:  [98.5 F (36.9 C)-99.5 F (37.5 C)] 99 F (37.2 C) (12/18 1324) Pulse Rate:  [79-84] 79 (12/18 1157) Resp:  [12-20] 20 (12/18 1324) BP: (112-129)/(59-76) 118/59 mmHg (12/18 1324) SpO2:  [99 %-100 %] 100 % (12/18 1324)  General: awake, eating in bed Skin: no rashes Lungs: CTA   Lab Results Lab Results  Component Value Date   WBC 2.1* 03/20/2014   HGB 7.4* 03/20/2014   HCT 21.5* 03/20/2014   MCV 91.9 03/20/2014   PLT 77* 03/20/2014    Lab Results  Component Value Date   CREATININE 2.77* 03/20/2014   BUN 41* 03/20/2014   NA 139 03/20/2014   K 3.8 03/20/2014   CL 104 03/20/2014   CO2 24 03/20/2014    Lab Results  Component Value Date   ALT 6 03/19/2014   AST 8 03/19/2014   ALKPHOS 48 03/19/2014   BILITOT <0.2* 03/19/2014      Microbiology: Recent Results (from the past 240 hour(s))  Clostridium Difficile by PCR     Status: None   Collection Time: 03/16/14 12:02 PM  Result Value Ref Range Status   C difficile by pcr NEGATIVE NEGATIVE Final  Cryptosporidium Smear, Fecal     Status: None   Collection Time: 03/17/14  9:21 PM  Result Value Ref Range Status   Specimen Description STOOL  Final   Special Requests NONE  Final   Cryptosporidium Smear.   Final    NO Cryptosporidium Cyclospora or Isospora seen. Performed at [facility]    Report Status 03/18/2014 FINAL  Final  Ova and parasite examination     Status: None   Collection Time: 03/17/14  9:22 PM  Result Value Ref Range Status   Specimen Description STOOL  Final   Special Requests NONE  Final   Ova and parasites   Final    NO OVA OR PARASITES SEEN Performed at [facility]    Report Status 03/18/2014 FINAL  Final    Studies/Results: No results found.  Assessment/Plan:  1) HIV - CD 4 noted, viral load up a bit.  I  agree with pharmacy that PCP prophylaxis indicated, particularly due to continued prednisone use. Will start Bactrim DS 1 tab MWF.    2) diarrhea - CMV possible, PCR sent off.    3) pancytopenia - about the same overall. Etiology from meds (ARVs/mycophenolate or possibly CMV).    Scharlene Gloss, [facility] Infectious [facility] [place] [email or web address] [phone] pager   [phone] cell 03/20/2014, 2:50 PM

## 2014-03-20 NOTE — Discharge Summary (Signed)
[facility] Discharge Summary  Patient name: Kenneth [name] Medical record number: [id number] Date of birth: 02-26-1956 Age: 58 y.o. Gender: male Date of Admission: 03/16/2014  Date of Discharge: 03/20/14 Admitting Physician: Dickie La, MD  Primary Care Provider: Cordelia Poche, MD Consultants: Infectious disease, Nephrology  Indication for Hospitalization: Diarrhea and abdominal pain  Discharge Diagnoses/Problem List:  HIV HLD Anemia HTN Lupus Nephritis Abdominal pain Dehydration Diarrhea PVCs  Disposition: Discharge home with close follow up with nephrology and PCP  Discharge Condition: Stable  Discharge Exam:  BP 118/59 mmHg  Pulse 79  Temp(Src) 99 F (37.2 C) (Oral)  Resp 20  Ht 6\' 1"  (1.854 m)  Wt 133 lb (60.328 kg)  BMI 17.55 kg/m2  SpO2 100%  General: Non-toxic, thin appearing man lying in hospital bed in NAD Cardiovascular: Several ectopic beats noted, regular rate. No murmurs appreciated Respiratory: NWOB, CTAB, no wheezes appreciated Abdomen: +BS, NT, nondistended, no rebound Extremities: No cyanosis or edema Neuro: no focal deficits appreciated, follows commands Psych: normal speech, flat affect, stable mood  Brief Hospital Course:  Kenneth [name] is a 58 y.o. male who presented with diarrhea and abdominal pain. 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  His hospital course, by problem, is outlined below:  Diarrhea. Patient presented with 1 week of diarrhea (4-5 BMs per day) and RUQ abdominal pain. Differential on admission was broad given his immunocompromised status (see problems below) and included viral gastroenteritis, other infectious etiologies, inflammatory etiologies, and medication-induced (cellcept, HIV meds). Patient's infectious work up including c diff PCR, stool pathogen panel, and cryptosporidum smear was negative.  Patient had abdominal CT in the ED which was unremarkable. Patient's cellcept was stopped. Patient supported symptomatically with IVF and immodium. His diarrhea gradually improved during his stay and he was able to compensate for his fluid losses with good PO intake at the time of discharge.   Pancytopenia. Patient was noted to be pancytopenic on admission (WBC 3.9, HgB 10.4, PLT 59), likely medication induced (cellcept and HIV medications.) Patient had anti-dsDNA Ab drawn which was not markedly elevated, making lupus flare an unlikely cause. Peripheral smear showed normocytic anemia and thrombocytopenia. Patient's CBCs were monitored daily, and his counts were stable at the time of discharge.    HIV. Patient was noted to have CD4 count of 100 here with a detectable viral load. ID was consulted and thought that the patient's low CD4 count was likely due to chronic steroid use. He was started on daily bactrim for PCP prophylaxis while here. He was additionally continued on his home ziagen, tivicay, and epivir. He will be discharged to have a follow up with ID in 2-3 weeks.   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 Patient was noted to have Cr 3.14 on admission (down from 4.65 on discharge last month) and a bicarb of 13. Nephrology was consulted. His acidosis was likely secondary to bicarb losses (renal and diarrhea). He was started on bicarb supplementation with resolution of his acidosis and will be discharged with oral bicarb (1300mg  bid) supplementation, per nephrology. His creatinine remained stable during this admission. His cellcept was discontinued per nephrology as it was a likely contributing factor to the problems listed above. He was continued on his home dose of prednisone while here.   PVCs. On admission, patient was noted to have several PVCs noted on EKG and with significant  ectopy on physical exam. Patient exhibited no symptoms. Cardiology was consulted. Magnesium level was  found to be 1.7. Patient was given 1g of Magnesium sulfate. Echocardiogram showed low-normal EF with no structural abnormalities and cardiology determined that no further work up was indicated.   The patient's other chronic medical conditions, including HTN and HLD were stable during this admission and no changes were made to the patient's medications at discharge.    Issues for Follow Up:  1) Consider repeating FOBT when no longer having diarrhea - may need repeat colonoscopy if continues to have positive FOBT (last colonoscopy in 2008) 2) f/u dysphagia - patient complained of intermittent difficulty swallowing and has history of esophageal strictures. He was instructed to contact [name] for outpatient management.  3) f/u CBC - Ensure that cell lines are stable s/p stopping cellcept. Can consider further work up if cells lines continue to be low 4) Patient will have follow up with ID in first week of January.    Significant Procedures: Echocardiogram: 50-55% EF, grade 1 diastolic dysfunction  Significant Labs and Imaging:   Recent Labs Lab 03/18/14 0443 03/19/14 0635 03/20/14 0611  WBC 3.2* 2.8* 2.1*  HGB 7.4* 7.0* 7.4*  HCT 22.5* 20.5* 21.5*  PLT 56* 62* 77*    Recent Labs Lab 03/16/14 1047 03/16/14 1507 03/16/14 2036 03/17/14 0500 03/18/14 0443 03/19/14 0635 03/20/14 0611  NA 140 137  --  136* 133* 136* 139  K 4.6 4.1  --  4.1 4.3 4.0 3.8  CL 110 111  --  112 111 106 104  CO2 13* 13*  --  13* 11* 18* 24  GLUCOSE 129* 98  --  105* 121* 112* 109*  BUN 39* 38*  --  36* 35* 40* 41*  CREATININE 3.39* 3.14*  --  3.03* 2.90* 2.92* 2.77*  CALCIUM 8.5 7.3*  --  7.2* 7.4* 7.4* 7.5*  MG  --   --  1.7  --   --   --   --   PHOS  --   --  3.2  --  3.8 4.1 4.0  ALKPHOS 59  --   --   --  47 48  --   AST 10  --   --   --  10 8  --   ALT 9  --   --   --  7 6  --   ALBUMIN 2.3*  --   --   --  1.7* 1.6* 1.7*   Mg 1.7 Phos 3.2 CK 100 Lactic acid 0.83 c diff negative Stool  pathogen panel negative Cryptosporidum negative Stool O&P negative Haptoglobin 232 C4 45, C3 13 INR 1.50 Anti dsDNA Ab 171    Ct Abdomen Pelvis Wo Contrast  03/16/2014   CLINICAL DATA:  Generalized abdominal pain and diarrhea for 1 week, history HIV, hypertension  EXAM: CT ABDOMEN AND PELVIS WITHOUT CONTRAST  TECHNIQUE: Multidetector CT imaging of the abdomen and pelvis was performed following the standard protocol without IV contrast. IV contrast not utilized due to renal dysfunction.  COMPARISON:  01/18/2009  FINDINGS: Minimal atelectasis or scarring at LEFT base.  Perigastric surgical clips with thickening at GE junction question prior antireflux procedure.  Retained contrast within a distended esophagus.  Calcified gallstones dependently in gallbladder.  Within limits of a nonenhanced exam no focal abnormalities of the liver, spleen, pancreas, LEFT kidney, or adrenal glands.  Surgically absent RIGHT kidney.  Colon is diffusely fluid filled and distended consistent with history of  diarrhea.  Small bowel loops opacified proximally, opacified distally, normal in caliber without suggestion of obstruction.  Decompressed IVC.  No mass, adenopathy, bowel wall thickening, free fluid, or free air.  Dextro convex thoracolumbar scoliosis.  IMPRESSION: Fluid filled distended colon to the rectum consistent with clinical presentation of diarrhea.  No definite bowel wall thickening or evidence of bowel obstruction.  Prior gastric surgery question anti reflux procedure with contrast distending the esophagus suggesting a degree of obstruction at the GE junction.  Post RIGHT nephrectomy.  No other definite intra-abdominal or intrapelvic abnormalities.   Electronically Signed   By: Lavonia Dana M.D.   On: 03/16/2014 14:50    Results/Tests Pending at Time of Discharge: None  Discharge Medications:    Medication List    STOP taking these medications        mycophenolate 500 MG tablet  Commonly known as:   CELLCEPT      TAKE these medications        abacavir 300 MG tablet  Commonly known as:  ZIAGEN  Take 2 tablets (600 mg total) by mouth daily.     acetaminophen 500 MG tablet  Commonly known as:  TYLENOL  Take 500 mg by mouth every 6 (six) hours as needed for mild pain.     amLODipine 10 MG tablet  Commonly known as:  NORVASC  Take 1 tablet (10 mg total) by mouth daily.     atorvastatin 40 MG tablet  Commonly known as:  LIPITOR  Take 1 tablet (40 mg total) by mouth daily.     cyclobenzaprine 5 MG tablet  Commonly known as:  FLEXERIL  Take 5 mg by mouth 3 (three) times daily as needed for muscle spasms.     dolutegravir 50 MG tablet  Commonly known as:  TIVICAY  Take 1 tablet (50 mg total) by mouth 2 (two) times daily.     lamiVUDine 10 MG/ML solution  Commonly known as:  EPIVIR  Take 10 mLs (100 mg total) by mouth daily.     omeprazole 20 MG capsule  Commonly known as:  PRILOSEC  Take 1 capsule (20 mg total) by mouth daily.     predniSONE 20 MG tablet  Commonly known as:  DELTASONE  Take 3 tablets (60 mg total) by mouth daily with breakfast.     sodium bicarbonate 650 MG tablet  Take 2 tablets (1,300 mg total) by mouth 2 (two) times daily.     sulfamethoxazole-trimethoprim 800-160 MG per tablet  Commonly known as:  BACTRIM DS,SEPTRA DS  Take 1 tablet by mouth 3 (three) times a week.     traMADol 50 MG tablet  Commonly known as:  ULTRAM  Take 1 tablet (50 mg total) by mouth every 8 (eight) hours as needed.        Discharge Instructions: Please refer to Patient Instructions section of EMR for full details.  Patient was counseled important signs and symptoms that should prompt return to medical care, changes in medications, dietary instructions, activity restrictions, and follow up appointments.   Follow-Up Appointments: Follow-up Information    Follow up with Thersa Salt, DO On 03/23/2014.   Specialty:  Family Medicine   Why:  4:15pm (hospital follow up)    Contact information:   [place]  [id number] [phone]       Follow up with DETERDING,JAMES L, MD. Go on 03/23/2014.   Specialty:  Nephrology   Why:  Lab appointment.  Office to call you with  appointment time.   Contact information:   [place] [address] [phone]       Follow up with Scharlene Gloss, MD.   Specialty:  Infectious Diseases   Why:  hospital follow up.  Office will call you with January appointment   Contact information:   [address] [place] [address] [phone]       Dimas Chyle, MD 03/21/2014, 12:17 AM PGY-1, [place]

## 2014-03-20 NOTE — Discharge Instructions (Signed)
Kenneth [name], we are so glad to see that you are doing better. You were admitted for diarrhea and abdominal pain.  Several stool studies were obtained which were negative.  Your Cellcept was discontinued because your cell lines were being affected.  This particular medication can also cause some of the symptoms you were experiencing.  Because your CD4 count is low, you are being discharged with Bactrim DS that you will take on Mondays, Wednesdays and Fridays.  He has also increased your Epivir to 100mg .  You will see Dr Linus Salmons in January and he will instruct you how long to keep taking these medications.  In addition, you are being discharged with Bicarbonate 1300mg  twice daily from the kidney doctors.  You will need to get labs done on Monday with Dr Deterding's office.  You also have an appointment scheduled with Family Medicine on Monday 12/21 @ 4:15pm for hospital follow up.  Please make sure that you attend these appointments.     Abdominal Pain Many things can cause belly (abdominal) pain. Most times, the belly pain is not dangerous. Many cases of belly pain can be watched and treated at home. HOME CARE   Do not take medicines that help you go poop (laxatives) unless told to by your doctor.  Only take medicine as told by your doctor.  Eat or drink as told by your doctor. Your doctor will tell you if you should be on a special diet. GET HELP IF:  You do not know what is causing your belly pain.  You have belly pain while you are sick to your stomach (nauseous) or have runny poop (diarrhea).  You have pain while you pee or poop.  Your belly pain wakes you up at night.  You have belly pain that gets worse or better when you eat.  You have belly pain that gets worse when you eat fatty foods.  You have a fever. GET HELP RIGHT AWAY IF:   The pain does not go away within 2 hours.  You keep throwing up (vomiting).  The pain changes and is only in the right or left part of the belly.  You  have bloody or tarry looking poop. MAKE SURE YOU:   Understand these instructions.  Will watch your condition.  Will get help right away if you are not doing well or get worse. Document Released: 09/06/2007 Document Revised: 03/25/2013 Document Reviewed: 11/27/2012 [facility] Patient Information 2015 [place], [place]. This information is not intended to replace advice given to you by your health care provider. Make sure you discuss any questions you have with your health care provider.

## 2014-03-20 NOTE — Progress Notes (Signed)
Subjective: Interval History: has complaints still some D, Not sure what he wants to do about d/c.  Objective: Vital signs in last 24 hours: Temp:  [98.5 F (36.9 C)-99.5 F (37.5 C)] 98.5 F (36.9 C) (12/18 0525) Pulse Rate:  [81-84] 81 (12/18 0525) Resp:  [12-20] 12 (12/18 0525) BP: (112-123)/(70-75) 112/70 mmHg (12/18 0525) SpO2:  [99 %-100 %] 99 % (12/18 0525) Weight change:   Intake/Output from previous day: 12/17 0701 - 12/18 0700 In: 1197.2 [P.O.:478; I.V.:719.2] Out: 900 [Urine:900] Intake/Output this shift:    General appearance: alert, cooperative and no distress Resp: clear to auscultation bilaterally Cardio: S1, S2 normal and systolic murmur: holosystolic 2/6, blowing at apex GI: soft, non-tender; bowel sounds normal; no masses,  no organomegaly Extremities: no edema Skin: very dry  Lab Results:  Recent Labs  03/19/14 0635 03/20/14 0611  WBC 2.8* 2.1*  HGB 7.0* 7.4*  HCT 20.5* 21.5*  PLT 62* 77*   BMET:  Recent Labs  03/19/14 0635 03/20/14 0611  NA 136* 139  K 4.0 3.8  CL 106 104  CO2 18* 24  GLUCOSE 112* 109*  BUN 40* 41*  CREATININE 2.92* 2.77*  CALCIUM 7.4* 7.5*   No results for input(s): PTH in the last 72 hours. Iron Studies: No results for input(s): IRON, TIBC, TRANSFERRIN, FERRITIN in the last 72 hours.  Studies/Results: No results found.  I have reviewed the patient's current medications.  Assessment/Plan 1 SLE nephritis Function stable, would cont Pred 60mg /d. Acidemia better 2 D still present , need to check CMV.  Manageable for him 3 HIV 4 Pancytopenia  ? CMV, vs mycophenolate interaction with HIV meds. P ok by [place] to d/c if close f/u is set up.  Needs labs on Mon     LOS: 4 days   Julius Matus L 03/20/2014,10:24 AM

## 2014-03-20 NOTE — Progress Notes (Signed)
Family Medicine Teaching Service Daily Progress Note Intern Pager: [phone]  Patient name: Kenneth [name] Medical record number: [id number] Date of birth: 1955/08/07 Age: 58 y.o. Gender: male  Primary Care Provider: Cordelia Poche, MD Consultants: Cardiology Code Status: Full  Pt Overview and Major Events to Date:  12/14 Admitted with diarrhea  Assessment and Plan: Kenneth [name] is a 58 y.o. male presenting with diarrhea and abdominal pain. 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 Differential includes gastroenteritis (unlikely given lack of nausea and vomiting), inflammatory source (positive FOBT, though patient is without fevers or chills), or medication induced (cellcept, HIV meds). Can consider CMV (given h/o HIV), though no signs of colitis on abdominal CT. C diff infection ruled out. Last colonoscopy in 2008.  -Soft diet, ADAT -IVF -Stool pathogen panel, stool culture, O&P, and cryptosporidium smear negative  +FOBT/Anemia. Positive FOBT potentially due to patient's diarrhea. HgB 10.4 on admission. Baseline 8-11. Currently hemodynamically stable. Last colonoscopy in 2008. hgb 7.4 -continue home PPI -Consult GI if concern for acute GI bleed or if patient becomes unstable -Continue to trend CBCs  Pancytopenia. Unclear etiology. Possible related to patient's HIV or SLE. Additionally possibly related to patient's cellcept. Improving this am. hgb 7.4, platelets 77. Smear normocytic anemia and thrombocytopenia. -Continue to monitor CBCs -Renal following along, appreciate assistance.  -Holding cellcept -Anti-dsDNA Ab elevated to 171 (610 in November)  PVCs. Patient with several PVCs noted on EKG and with significant ectopy on physical exam. Currently asymptomatic. Mg 1.7. -Monitor on telemetry -Cards consulted, appreciate assistance - no further work up  indicated at this time -Change anti-emetic to phenergan from zofran given QTC 494 -Echo with EF 99991111, grade 1 diastolic dysfunction  HIV. CD4 count of 240 on 02/13/2014 with non detectable viral load. CD4 count 100, HIV quant 109.  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 Cr 3.14 on admission (down from 4.65 on discharge last month). Cr 2.77 this morning. -Continue home prednisone -Holding home cellcept as above -IVF as above -Cr improving with IVF  HTN. At goal on admission - Holding home amlodipine given dehydration and normal BPs here - will add back as indicated  HLD. Continue home statin  Severe Protein Calorie Malnutrition.  -Nutrition following, appreciate assistance.  -Breeze PO TID  FEN/GI: Soft diet, bicarb 172mEq in sterile water @100cc /hr Prophylaxis: SCDs given thrombocytopenia  Disposition: Admitted to telemetry pending above management  Subjective:  Patient reports that he continues to have loose stools but that they are greatly improved.  Denies abdominal pain, N/V since yesterday, CP, SOB, dizziness.  Voices no concerns except to when he can leave the hospital.  I explained to him that we will defer to renal for additional guidance.  Objective: Temp:  [98.5 F (36.9 C)-99.5 F (37.5 C)] 98.5 F (36.9 C) (12/18 0525) Pulse Rate:  [81-84] 81 (12/18 0525) Resp:  [12-20] 12 (12/18 0525) BP: (112-123)/(70-75) 112/70 mmHg (12/18 0525) SpO2:  [99 %-100 %] 99 % (12/18 0525) Physical Exam: General: Non-toxic, thin appearing man lying in hospital bed in NAD Cardiovascular: Several ectopic beats noted, regular rate. No murmurs appreciated Respiratory: NWOB, CTAB, no wheezes appreciated Abdomen: +BS, NT, nondistended, no rebound Extremities: No cyanosis or edema Neuro: no focal deficits appreciated, follows commands Psych: normal speech, flat affect, stable  mood  Laboratory:  Recent Labs Lab 03/18/14 0443 03/19/14 0635 03/20/14 0611  WBC 3.2* 2.8* 2.1*  HGB 7.4* 7.0* 7.4*  HCT 22.5* 20.5* 21.5*  PLT 56* 62* 77*    Recent Labs Lab 03/16/14 1047  03/18/14 0443 03/19/14 0635 03/20/14 0611  NA 140  < > 133* 136* 139  K 4.6  < > 4.3 4.0 3.8  CL 110  < > 111 106 104  CO2 13*  < > 11* 18* 24  BUN 39*  < > 35* 40* 41*  CREATININE 3.39*  < > 2.90* 2.92* 2.77*  CALCIUM 8.5  < > 7.4* 7.4* 7.5*  PROT 6.6  --  4.8* 4.5*  --   BILITOT <0.2*  --  <0.2* <0.2*  --   ALKPHOS 59  --  47 48  --   ALT 9  --  7 6  --   AST 10  --  10 8  --   GLUCOSE 129*  < > 121* 112* 109*  < > = values in this interval not displayed. Mg 1.7 Phos 3.2 CK 100 Lactic acid 0.83 c diff negative Haptoglobin 232 C4 45, C3 13 INR 1.50 Anti dsDNA Ab 171   Imaging/Diagnostic Tests: None new  Janora Norlander, DO 03/20/2014, 8:06 AM PGY-1, [place] Intern pager: [phone], text pages welcome

## 2014-03-23 ENCOUNTER — Ambulatory Visit (INDEPENDENT_AMBULATORY_CARE_PROVIDER_SITE_OTHER): Payer: BC Managed Care – PPO | Admitting: Family Medicine

## 2014-03-23 VITALS — BP 114/74 | HR 93 | Temp 98.1°F | Ht 73.0 in | Wt 134.6 lb

## 2014-03-23 DIAGNOSIS — R197 Diarrhea, unspecified: Secondary | ICD-10-CM

## 2014-03-23 DIAGNOSIS — D61818 Other pancytopenia: Secondary | ICD-10-CM | POA: Diagnosis not present

## 2014-03-23 DIAGNOSIS — R1011 Right upper quadrant pain: Secondary | ICD-10-CM | POA: Diagnosis not present

## 2014-03-23 DIAGNOSIS — D649 Anemia, unspecified: Secondary | ICD-10-CM

## 2014-03-23 LAB — CBC WITH DIFFERENTIAL/PLATELET
BASOS PCT: 0 % (ref 0–1)
Basophils Absolute: 0 10*3/uL (ref 0.0–0.1)
Eosinophils Absolute: 0 10*3/uL (ref 0.0–0.7)
Eosinophils Relative: 0 % (ref 0–5)
HEMATOCRIT: 25.8 % — AB (ref 39.0–52.0)
Hemoglobin: 8.4 g/dL — ABNORMAL LOW (ref 13.0–17.0)
LYMPHS ABS: 0.6 10*3/uL — AB (ref 0.7–4.0)
Lymphocytes Relative: 18 % (ref 12–46)
MCH: 29.9 pg (ref 26.0–34.0)
MCHC: 32.6 g/dL (ref 30.0–36.0)
MCV: 91.8 fL (ref 78.0–100.0)
MONOS PCT: 10 % (ref 3–12)
MPV: 10.6 fL (ref 9.4–12.4)
Monocytes Absolute: 0.3 10*3/uL (ref 0.1–1.0)
NEUTROS ABS: 2.4 10*3/uL (ref 1.7–7.7)
Neutrophils Relative %: 72 % (ref 43–77)
Platelets: 152 10*3/uL (ref 150–400)
RBC: 2.81 MIL/uL — ABNORMAL LOW (ref 4.22–5.81)
RDW: 16.9 % — ABNORMAL HIGH (ref 11.5–15.5)
WBC: 3.4 10*3/uL — AB (ref 4.0–10.5)

## 2014-03-23 NOTE — Patient Instructions (Signed)
It was nice to see you today.  I'm glad to hear that you had some improvement in your symptoms.  Per the notes/chart, we did not find the etiology of your symptoms.  Please continue to follow closely with your primary provider.  He can contemplate and discuss the use of Remeron for appetite stimulation with him at your next visit. I  recommended you follow-up with him in approximately one month.   Additionally, please be sure to follow-up with your nephrologist as well as your ID physician as indicated.  Happy holidays  Dr. Lacinda Axon

## 2014-03-24 NOTE — Assessment & Plan Note (Signed)
Diarrhea improving.  Needs close follow up with PCP and ID.

## 2014-03-24 NOTE — Assessment & Plan Note (Signed)
Pancytopenia during hospitalization. Repeating CBC today.

## 2014-03-24 NOTE — Progress Notes (Signed)
   Subjective:    Patient ID: Kenneth [name], male    DOB: 1955-12-27, 58 y.o.   MRN: [id number]  HPI 58 year old male with HIV and Lupus presents for hospital follow up.  Patient admitted from 12/14 to 12/18 for diarrhea and abdominal pain.  Work up was unremarkable and patient was discharged in stable condition.  He reports that he continues to feel poorly. He states that his diarrhea is improved but not resolved.  His abdominal pain is still present. He states that it is intermittent and moderate in severity.  No recent fever, chills.  He states that he continues to have poor appetite.  He denies any dysphagia.   Review of Systems Per HPI    Objective:   Physical Exam Filed Vitals:   03/23/14 1636  BP: 114/74  Pulse: 93  Temp: 98.1 F (36.7 C)   Exam: General: chronically ill appearing cachetic male in NAD.  Cardiovascular: RRR. No murmur.  Respiratory: CTAB. No rales, rhonchi, or wheeze. Abdomen: soft, mildly tender in the RUQ. Nondistended. No rebound or guarding.    Assessment & Plan:  See Problem List

## 2014-03-24 NOTE — Assessment & Plan Note (Signed)
Unclear etiology with negative CT scan during hospitalization. Exam with mild RUQ tenderness today.  No rebound or guarding. Patient needs continued close monitoring/follow up.

## 2014-04-07 ENCOUNTER — Other Ambulatory Visit: Payer: Self-pay | Admitting: *Deleted

## 2014-04-07 ENCOUNTER — Telehealth: Payer: Self-pay | Admitting: Family Medicine

## 2014-04-07 NOTE — Telephone Encounter (Signed)
Pt called and would like to speak to Dr. Lisbeth Ply nurse. He wants to update the doctor on his condition and what has been going since the holidays. jw

## 2014-04-09 NOTE — Telephone Encounter (Signed)
LMOVM for pt to call us back. Deseree Blount, CMA  

## 2014-04-16 ENCOUNTER — Other Ambulatory Visit: Payer: Self-pay | Admitting: Family Medicine

## 2014-04-16 NOTE — Telephone Encounter (Signed)
Pt is requesting refills on atorvastatin and dolutegravir, pt is currently in [place] and needs rxs sent to [facility] in [place], their phone number is [phone].

## 2014-04-20 MED ORDER — ATORVASTATIN CALCIUM 40 MG PO TABS: 40.0000 mg | ORAL_TABLET | Freq: Every day | ORAL | Status: DC

## 2014-04-20 MED ORDER — DOLUTEGRAVIR SODIUM 50 MG PO TABS: 50.0000 mg | ORAL_TABLET | Freq: Two times a day (BID) | ORAL | Status: DC

## 2014-04-21 ENCOUNTER — Other Ambulatory Visit: Payer: Self-pay | Admitting: *Deleted

## 2014-04-21 MED ORDER — ABACAVIR SULFATE 300 MG PO TABS: 600.0000 mg | ORAL_TABLET | Freq: Every day | ORAL | Status: DC

## 2014-04-21 NOTE — Telephone Encounter (Signed)
Needs refill on adcaver, tiditay

## 2014-04-21 NOTE — Telephone Encounter (Signed)
Needs refill on abacavir [place]

## 2014-06-22 ENCOUNTER — Inpatient Hospital Stay (HOSPITAL_COMMUNITY): Payer: BC Managed Care – PPO

## 2014-06-22 ENCOUNTER — Ambulatory Visit (INDEPENDENT_AMBULATORY_CARE_PROVIDER_SITE_OTHER): Payer: BC Managed Care – PPO | Admitting: *Deleted

## 2014-06-22 ENCOUNTER — Encounter: Payer: Self-pay | Admitting: Family Medicine

## 2014-06-22 ENCOUNTER — Encounter (HOSPITAL_COMMUNITY): Payer: Self-pay | Admitting: General Practice

## 2014-06-22 ENCOUNTER — Inpatient Hospital Stay (HOSPITAL_COMMUNITY)
Admission: AD | Admit: 2014-06-22 | Discharge: 2014-07-07 | DRG: 981 | Disposition: A | Discharge status: Home Health | Payer: BC Managed Care – PPO | Source: Ambulatory Visit | Attending: Family Medicine | Admitting: Family Medicine

## 2014-06-22 ENCOUNTER — Ambulatory Visit (INDEPENDENT_AMBULATORY_CARE_PROVIDER_SITE_OTHER): Payer: BC Managed Care – PPO | Admitting: Family Medicine

## 2014-06-22 VITALS — BP 127/93 | HR 117 | Temp 97.7°F | Ht 73.0 in | Wt 178.1 lb

## 2014-06-22 DIAGNOSIS — R791 Abnormal coagulation profile: Secondary | ICD-10-CM | POA: Diagnosis not present

## 2014-06-22 DIAGNOSIS — F329 Major depressive disorder, single episode, unspecified: Secondary | ICD-10-CM | POA: Diagnosis present

## 2014-06-22 DIAGNOSIS — N185 Chronic kidney disease, stage 5: Secondary | ICD-10-CM | POA: Diagnosis present

## 2014-06-22 DIAGNOSIS — E785 Hyperlipidemia, unspecified: Secondary | ICD-10-CM | POA: Diagnosis present

## 2014-06-22 DIAGNOSIS — N186 End stage renal disease: Secondary | ICD-10-CM | POA: Diagnosis present

## 2014-06-22 DIAGNOSIS — R05 Cough: Secondary | ICD-10-CM

## 2014-06-22 DIAGNOSIS — R140 Abdominal distension (gaseous): Secondary | ICD-10-CM | POA: Diagnosis present

## 2014-06-22 DIAGNOSIS — K220 Achalasia of cardia: Secondary | ICD-10-CM | POA: Diagnosis present

## 2014-06-22 DIAGNOSIS — E8770 Fluid overload, unspecified: Secondary | ICD-10-CM | POA: Diagnosis present

## 2014-06-22 DIAGNOSIS — E872 Acidosis: Secondary | ICD-10-CM | POA: Diagnosis present

## 2014-06-22 DIAGNOSIS — E875 Hyperkalemia: Secondary | ICD-10-CM | POA: Diagnosis present

## 2014-06-22 DIAGNOSIS — K224 Dyskinesia of esophagus: Secondary | ICD-10-CM | POA: Diagnosis present

## 2014-06-22 DIAGNOSIS — Z800 Family history of malignant neoplasm of digestive organs: Secondary | ICD-10-CM

## 2014-06-22 DIAGNOSIS — I129 Hypertensive chronic kidney disease with stage 1 through stage 4 chronic kidney disease, or unspecified chronic kidney disease: Secondary | ICD-10-CM

## 2014-06-22 DIAGNOSIS — I10 Essential (primary) hypertension: Secondary | ICD-10-CM

## 2014-06-22 DIAGNOSIS — I82402 Acute embolism and thrombosis of unspecified deep veins of left lower extremity: Secondary | ICD-10-CM

## 2014-06-22 DIAGNOSIS — Z01810 Encounter for preprocedural cardiovascular examination: Secondary | ICD-10-CM | POA: Diagnosis not present

## 2014-06-22 DIAGNOSIS — Z86718 Personal history of other venous thrombosis and embolism: Secondary | ICD-10-CM

## 2014-06-22 DIAGNOSIS — D638 Anemia in other chronic diseases classified elsewhere: Secondary | ICD-10-CM | POA: Diagnosis present

## 2014-06-22 DIAGNOSIS — Z95828 Presence of other vascular implants and grafts: Secondary | ICD-10-CM

## 2014-06-22 DIAGNOSIS — R601 Generalized edema: Secondary | ICD-10-CM

## 2014-06-22 DIAGNOSIS — I82409 Acute embolism and thrombosis of unspecified deep veins of unspecified lower extremity: Secondary | ICD-10-CM | POA: Insufficient documentation

## 2014-06-22 DIAGNOSIS — Z6823 Body mass index (BMI) 23.0-23.9, adult: Secondary | ICD-10-CM | POA: Diagnosis not present

## 2014-06-22 DIAGNOSIS — N269 Renal sclerosis, unspecified: Secondary | ICD-10-CM | POA: Diagnosis present

## 2014-06-22 DIAGNOSIS — E43 Unspecified severe protein-calorie malnutrition: Secondary | ICD-10-CM | POA: Diagnosis present

## 2014-06-22 DIAGNOSIS — D696 Thrombocytopenia, unspecified: Secondary | ICD-10-CM | POA: Diagnosis present

## 2014-06-22 DIAGNOSIS — I120 Hypertensive chronic kidney disease with stage 5 chronic kidney disease or end stage renal disease: Secondary | ICD-10-CM | POA: Diagnosis present

## 2014-06-22 DIAGNOSIS — R059 Cough, unspecified: Secondary | ICD-10-CM

## 2014-06-22 DIAGNOSIS — Z418 Encounter for other procedures for purposes other than remedying health state: Secondary | ICD-10-CM

## 2014-06-22 DIAGNOSIS — Z419 Encounter for procedure for purposes other than remedying health state, unspecified: Secondary | ICD-10-CM

## 2014-06-22 DIAGNOSIS — M3214 Glomerular disease in systemic lupus erythematosus: Secondary | ICD-10-CM | POA: Diagnosis present

## 2014-06-22 DIAGNOSIS — R1310 Dysphagia, unspecified: Secondary | ICD-10-CM | POA: Diagnosis not present

## 2014-06-22 DIAGNOSIS — Z7901 Long term (current) use of anticoagulants: Secondary | ICD-10-CM

## 2014-06-22 DIAGNOSIS — N179 Acute kidney failure, unspecified: Secondary | ICD-10-CM

## 2014-06-22 DIAGNOSIS — N182 Chronic kidney disease, stage 2 (mild): Secondary | ICD-10-CM | POA: Diagnosis not present

## 2014-06-22 DIAGNOSIS — D649 Anemia, unspecified: Secondary | ICD-10-CM

## 2014-06-22 DIAGNOSIS — T45515A Adverse effect of anticoagulants, initial encounter: Secondary | ICD-10-CM | POA: Diagnosis present

## 2014-06-22 DIAGNOSIS — B20 Human immunodeficiency virus [HIV] disease: Secondary | ICD-10-CM

## 2014-06-22 DIAGNOSIS — D539 Nutritional anemia, unspecified: Secondary | ICD-10-CM | POA: Diagnosis present

## 2014-06-22 DIAGNOSIS — I313 Pericardial effusion (noninflammatory): Secondary | ICD-10-CM | POA: Diagnosis present

## 2014-06-22 DIAGNOSIS — Z992 Dependence on renal dialysis: Secondary | ICD-10-CM

## 2014-06-22 DIAGNOSIS — R509 Fever, unspecified: Secondary | ICD-10-CM

## 2014-06-22 HISTORY — DX: Acute embolism and thrombosis of unspecified deep veins of unspecified lower extremity: I82.409

## 2014-06-22 HISTORY — DX: Personal history of other medical treatment: Z92.89

## 2014-06-22 HISTORY — DX: Personal history of other diseases of the digestive system: Z87.19

## 2014-06-22 HISTORY — DX: Personal history of peptic ulcer disease: Z87.11

## 2014-06-22 LAB — URINALYSIS, ROUTINE W REFLEX MICROSCOPIC
Bilirubin Urine: NEGATIVE
GLUCOSE, UA: NEGATIVE mg/dL
KETONES UR: NEGATIVE mg/dL
Leukocytes, UA: NEGATIVE
NITRITE: NEGATIVE
Specific Gravity, Urine: 1.02 (ref 1.005–1.030)
Urobilinogen, UA: 0.2 mg/dL (ref 0.0–1.0)
pH: 6.5 (ref 5.0–8.0)

## 2014-06-22 LAB — CBC WITH DIFFERENTIAL/PLATELET
BASOS ABS: 0 10*3/uL (ref 0.0–0.1)
Basophils Relative: 0 % (ref 0–1)
EOS PCT: 0 % (ref 0–5)
Eosinophils Absolute: 0 10*3/uL (ref 0.0–0.7)
HCT: 54.8 % — ABNORMAL HIGH (ref 39.0–52.0)
Hemoglobin: 18.2 g/dL — ABNORMAL HIGH (ref 13.0–17.0)
LYMPHS PCT: 38 % (ref 12–46)
Lymphs Abs: 1.2 10*3/uL (ref 0.7–4.0)
MCH: 30 pg (ref 26.0–34.0)
MCHC: 33.2 g/dL (ref 30.0–36.0)
MCV: 90.4 fL (ref 78.0–100.0)
Monocytes Absolute: 0.1 10*3/uL (ref 0.1–1.0)
Monocytes Relative: 4 % (ref 3–12)
Neutro Abs: 1.9 10*3/uL (ref 1.7–7.7)
Neutrophils Relative %: 58 % (ref 43–77)
PLATELETS: 142 10*3/uL — AB (ref 150–400)
RBC: 6.06 MIL/uL — ABNORMAL HIGH (ref 4.22–5.81)
RDW: 14.7 % (ref 11.5–15.5)
WBC: 3.2 10*3/uL — AB (ref 4.0–10.5)

## 2014-06-22 LAB — BASIC METABOLIC PANEL
ANION GAP: 10 (ref 5–15)
BUN: 65 mg/dL — AB (ref 6–23)
CO2: 17 mmol/L — ABNORMAL LOW (ref 19–32)
Calcium: 7.8 mg/dL — ABNORMAL LOW (ref 8.4–10.5)
Chloride: 113 mmol/L — ABNORMAL HIGH (ref 96–112)
Creatinine, Ser: 9.12 mg/dL — ABNORMAL HIGH (ref 0.50–1.35)
GFR calc non Af Amer: 6 mL/min — ABNORMAL LOW (ref >90)
GFR, EST AFRICAN AMERICAN: 7 mL/min — AB (ref >90)
Glucose, Bld: 107 mg/dL — ABNORMAL HIGH (ref 70–99)
Potassium: 5.5 mmol/L — ABNORMAL HIGH (ref 3.5–5.1)
Sodium: 140 mmol/L (ref 135–145)

## 2014-06-22 LAB — CBC
HEMATOCRIT: 30.3 % — AB (ref 39.0–52.0)
HEMOGLOBIN: 10 g/dL — AB (ref 13.0–17.0)
MCH: 30.7 pg (ref 26.0–34.0)
MCHC: 33 g/dL (ref 30.0–36.0)
MCV: 92.9 fL (ref 78.0–100.0)
Platelets: 258 10*3/uL (ref 150–400)
RBC: 3.26 MIL/uL — ABNORMAL LOW (ref 4.22–5.81)
RDW: 14.8 % (ref 11.5–15.5)
WBC: 5.6 10*3/uL (ref 4.0–10.5)

## 2014-06-22 LAB — COMPREHENSIVE METABOLIC PANEL
ALK PHOS: 63 U/L (ref 39–117)
ALT: 11 U/L (ref 0–53)
ANION GAP: 10 (ref 5–15)
AST: 16 U/L (ref 0–37)
Albumin: 1.1 g/dL — ABNORMAL LOW (ref 3.5–5.2)
BUN: 64 mg/dL — ABNORMAL HIGH (ref 6–23)
CALCIUM: 7.8 mg/dL — AB (ref 8.4–10.5)
CO2: 20 mmol/L (ref 19–32)
Chloride: 113 mmol/L — ABNORMAL HIGH (ref 96–112)
Creatinine, Ser: 9.56 mg/dL — ABNORMAL HIGH (ref 0.50–1.35)
GFR calc non Af Amer: 5 mL/min — ABNORMAL LOW (ref >90)
GFR, EST AFRICAN AMERICAN: 6 mL/min — AB (ref >90)
GLUCOSE: 88 mg/dL (ref 70–99)
POTASSIUM: 5.3 mmol/L — AB (ref 3.5–5.1)
Sodium: 143 mmol/L (ref 135–145)
Total Bilirubin: 0.3 mg/dL (ref 0.3–1.2)
Total Protein: 6.3 g/dL (ref 6.0–8.3)

## 2014-06-22 LAB — URINE MICROSCOPIC-ADD ON

## 2014-06-22 LAB — POCT INR

## 2014-06-22 LAB — PROTIME-INR
INR: 6.35 (ref 0.00–1.49)
PROTHROMBIN TIME: 56.4 s — AB (ref 11.6–15.2)

## 2014-06-22 LAB — BRAIN NATRIURETIC PEPTIDE: B Natriuretic Peptide: 197.1 pg/mL — ABNORMAL HIGH (ref 0.0–100.0)

## 2014-06-22 MED ORDER — SODIUM CHLORIDE 0.9 % IJ SOLN
3.0000 mL | Freq: Two times a day (BID) | INTRAMUSCULAR | Status: DC
  Administered 2014-06-22 – 2014-07-07 (×20): 3 mL via INTRAVENOUS

## 2014-06-22 MED ORDER — SODIUM CHLORIDE 0.9 % IJ SOLN
3.0000 mL | INTRAMUSCULAR | Status: DC | PRN
  Administered 2014-06-22: 3 mL via INTRAVENOUS
  Filled 2014-06-22: qty 3

## 2014-06-22 MED ORDER — LAMIVUDINE 10 MG/ML PO SOLN
100.0000 mg | Freq: Every day | ORAL | Status: DC
  Administered 2014-06-22 – 2014-06-24 (×3): 100 mg via ORAL
  Filled 2014-06-22 (×3): qty 10

## 2014-06-22 MED ORDER — AMLODIPINE BESYLATE 5 MG PO TABS
5.0000 mg | ORAL_TABLET | Freq: Every day | ORAL | Status: DC
  Administered 2014-06-22 – 2014-06-29 (×8): 5 mg via ORAL
  Filled 2014-06-22 (×8): qty 1

## 2014-06-22 MED ORDER — PANTOPRAZOLE SODIUM 40 MG PO TBEC
40.0000 mg | DELAYED_RELEASE_TABLET | Freq: Every day | ORAL | Status: DC
  Administered 2014-06-22 – 2014-06-23 (×2): 40 mg via ORAL
  Filled 2014-06-22 (×2): qty 1

## 2014-06-22 MED ORDER — FLUCONAZOLE 100 MG PO TABS
100.0000 mg | ORAL_TABLET | ORAL | Status: DC
  Administered 2014-06-22: 100 mg via ORAL
  Filled 2014-06-22: qty 1

## 2014-06-22 MED ORDER — ATORVASTATIN CALCIUM 40 MG PO TABS
40.0000 mg | ORAL_TABLET | Freq: Every day | ORAL | Status: DC
  Administered 2014-06-22 – 2014-07-06 (×14): 40 mg via ORAL
  Filled 2014-06-22 (×16): qty 1

## 2014-06-22 MED ORDER — ABACAVIR SULFATE 300 MG PO TABS
600.0000 mg | ORAL_TABLET | Freq: Every day | ORAL | Status: DC
  Administered 2014-06-22 – 2014-07-07 (×16): 600 mg via ORAL
  Filled 2014-06-22 (×16): qty 2

## 2014-06-22 MED ORDER — DOLUTEGRAVIR SODIUM 50 MG PO TABS
50.0000 mg | ORAL_TABLET | Freq: Every day | ORAL | Status: DC
  Administered 2014-06-22 – 2014-07-07 (×16): 50 mg via ORAL
  Filled 2014-06-22 (×16): qty 1

## 2014-06-22 MED ORDER — WARFARIN - PHARMACIST DOSING INPATIENT: Freq: Every day | Status: DC

## 2014-06-22 MED ORDER — SODIUM CHLORIDE 0.9 % IV SOLN: 250.0000 mL | INTRAVENOUS | Status: DC | PRN

## 2014-06-22 NOTE — H&P (Signed)
[facility] Admission History and Physical Service Pager: [phone]  Patient name: Kenneth [name] Medical record number: [id number] Date of birth: 1955-04-08 Age: 59 y.o. Gender: male  Primary Care Provider: Cordelia Poche, MD Consultants: ID Code Status: Full, though need to clarify with patient  Chief Complaint: Supratherapeutic INR  Assessment and Plan: [name] is a 59 y.o. male presenting with cough and abdominal distension in clinic, found to have supratherapeutic INR to ~8 . PMH is significant for abdominal pain, anemia, DVT, CKD II, ED, HTN, hemorrhoids, HIV, HLD, lupus, PVCs, protein calorie malnutrition.  Supratherapeutic INR INR today in clinic >8. Value later verified at 6.3. On coumadin for DVT in January in [place], no records for verification of details. Some concern in clinic of poor understanding of meds (taking when told to stop taking) / lost to f/u. Reports no current bleeding. Abd distension present but no tenderness; more likely gas than abdominal infection or bleeding; no reported trauma.  - Admit to inpatient, attending Dr Gwendlyn Deutscher. - Pharmacy consulted for supratherapeutic INR. - CMET to eval LFTs, CBC for PLTs  Abdominal distension Noted a few weeks now, no tenderness, moderate tachycardia to 110s. Consider ascites but no clear fluid wave. Afebrile, difficult to palpate for organs due to distension. DDx is broad with h/o HIV, FH of colon cancer. Consider malignancy with stool character change. - CMET, CBC, CT abdomen (instead of [place] to get better visualization of liver), BNP, chest CT though breathing easily - Consider dx tap if fluid present. - Will consider stool regimen if shows stool burden - Eval when due for colonoscopy (done 08/2010 per chart but cannot find result)  Cough, improving, with LE and abdominal edema Unclear etiology. No reported dyspnea. Lung exam mild decreased aeration bases. Reports LE edema is somewhat  chronic. Echo 03/2014 with nl LV function and grade 1 diastolic dysfunction with trace MR and TR. Broad ddx due to chronicity (concern for malignancy), h/o HIV (opportunistic infection with unclear if taking medications). - Labs per above along with BNP. - CT chest - Consulted ID. - Consider TSH. - Consider echocardiogram.  Tachycardia To 110s. No chest pain or dyspnea reported. LE edema symmetric with no calf tenderness. H/o DVT recently diagnosed. Has been on coumadin and currently supratherapeutic. - Monitor and if no improvement, check EKG and consider CTA especially if any chest pain or hypoxemia.  Recent DVT Per patient, found when he went to [place]. No records to verify details. Has been on coumadin with spotty history of compliance with directions (recent supratherapeutic INR at 4 3/7, told to stop coumadin, does not seem to have stopped and did not follow up). - Hold coumadin; consult pharmacy - Order to obtain medical records - PT/INR tomorrow - Hold VTE pharmacologic ppx; SCDs - Up with assistance  HTN Stable in clinic - Continue home norvasc, decr to 5mg  daily due to unsure about his compliance  CKD II Most recent creatinine 2.77 and GFR 28 03/20/14 - Monitor with CMET  HIV Unclear what medications he is taking from home med list as patient is unclear about this in clinic. Previously seen by Dr [name] (ID). - Will hold off on ordering anything and consult ID to help with this and reestablish care. They have been called and greatly appreciate their recs.  Lupus Recent dx per patient, prednisone listed on med list but he states he is not taking.  - Will not order at this time but consider restarting once more  clarity with records.  HLD - Continued home atorvastatin  FEN/GI: Heart Healthy diet Prophylaxis: SCDs given supratherapeutic INR  Disposition: Pending improvement in INR and workup for abdominal distension   History of Present Illness: Kenneth [name] is  a 59 y.o. male presenting to clinic for cough and abdominal swelling, found to have INR 6.3 (initially thought to be 8). He reports that he has had a cough that was initially more severe but has improved. He denies fevers, chills, or sputum production. He does not mention orthopnea or PND. He has also had abdominal bloating for 3-4 weeks with no pain, and intermittent. He denies fevers, bloody emesis, BM, or urine, or bleeding from anywehre. He has daily BM but it is a very small amount and slightly liquid. In clinic, he is also due for INR check and is found to have INR >8 (later clarified by lab at 6.3). He has not been eating well regularly but denies new meds. Per clinical team, he was supposed to stop coumadin early Mar due to supratherapeutic INR 4 but never did and did not return for recheck. Has had some generalized weakness lately but no focal weakness. Reports taking coumadin daily.  Review Of Systems: Per HPI with the following additions: None Otherwise 12 point review of systems was performed and was unremarkable.  Patient Active Problem List   Diagnosis Date Noted  . PVC's (premature ventricular contractions) 03/17/2014  . Diarrhea   . Abdominal pain   . Protein-calorie malnutrition, severe 02/11/2014  . Lupus nephritis   . Arthralgia of multiple sites, bilateral 02/02/2014  . Joint swelling 02/01/2014  . Bilateral low back pain without sciatica 02/01/2014  . Chronic kidney disease (CKD), stage II (mild) 01/12/2010  . EXTERNAL HEMORRHOIDS WITHOUT MENTION COMP 01/20/2009  . Anemia 08/31/2006  . HIV disease 07/24/2006  . Hyperlipidemia 05/31/2006  . ERECTILE DYSFUNCTION 05/31/2006  . Essential hypertension 05/31/2006  . INSOMNIA NOS 05/31/2006   Past Medical History: Past Medical History  Diagnosis Date  . HIV infection     a. 02/13/2014 CD4 = 240 - undetectable viral load.  . Hyperlipidemia   . Hypertension   . Insomnia   . Dysphagia   . Achalasia   . Renal abscess   .  Hemorrhoids, internal   . Arthritis   . Premature ventricular contractions   . Pancytopenia   . CKD (chronic kidney disease), stage II   . Lupus nephritis   . SLE (systemic lupus erythematosus)   . Candida infection, esophageal     a. 2012 - noted on EGD.   Past Surgical History: Past Surgical History  Procedure Laterality Date  . Nephrectomy      Right   Social History: History  Substance Use Topics  . Smoking status: Never Smoker   . Smokeless tobacco: Never Used  . Alcohol Use: No   Additional social history: Lives alone Denies h/o tobacco use, drugs, or alcohol  Please also refer to relevant sections of EMR.  Family History: Family History  Problem Relation Age of Onset  . Colon cancer Father     deceased  . Other Mother     s/p pacemaker - alive and well.  . Other      5 brothers, 3 sisters - alive and well.   Allergies and Medications: Allergies  Allergen Reactions  . Amoxicillin     REACTION: diffuse rash   Current Outpatient Prescriptions on File Prior to Visit  Medication Sig Dispense Refill  . abacavir (  ZIAGEN) 300 MG tablet Take 2 tablets (600 mg total) by mouth daily. 60 tablet 0  . amLODipine (NORVASC) 10 MG tablet Take 1 tablet (10 mg total) by mouth daily. 30 tablet 11  . atorvastatin (LIPITOR) 40 MG tablet Take 1 tablet (40 mg total) by mouth daily. 30 tablet 0  . dolutegravir (TIVICAY) 50 MG tablet Take 1 tablet (50 mg total) by mouth 2 (two) times daily. 60 tablet 0  . lamiVUDine (EPIVIR) 10 MG/ML solution Take 10 mLs (100 mg total) by mouth daily. 240 mL 12  . acetaminophen (TYLENOL) 500 MG tablet Take 500 mg by mouth every 6 (six) hours as needed for mild pain.    . cyclobenzaprine (FLEXERIL) 5 MG tablet Take 5 mg by mouth 3 (three) times daily as needed for muscle spasms.    Marland Kitchen omeprazole (PRILOSEC) 20 MG capsule Take 1 capsule (20 mg total) by mouth daily. (Patient not taking: Reported on 06/22/2014) 30 capsule 2  . predniSONE (DELTASONE) 20 MG  tablet Take 3 tablets (60 mg total) by mouth daily with breakfast. (Patient not taking: Reported on 06/22/2014) 90 tablet 0  . sodium bicarbonate 650 MG tablet Take 2 tablets (1,300 mg total) by mouth 2 (two) times daily. (Patient not taking: Reported on 06/22/2014) 60 tablet 0  . sulfamethoxazole-trimethoprim (BACTRIM DS,SEPTRA DS) 800-160 MG per tablet Take 1 tablet by mouth 3 (three) times a week. (Patient not taking: Reported on 06/22/2014) 12 tablet 0  . traMADol (ULTRAM) 50 MG tablet Take 1 tablet (50 mg total) by mouth every 8 (eight) hours as needed. (Patient not taking: Reported on 06/22/2014) 60 tablet 0   No current facility-administered medications on file prior to visit.    Objective: BP 127/93 mmHg  Pulse 117  Temp(Src) 97.7 F (36.5 C) (Oral)  Ht 6\' 1"  (1.854 m)  Wt 178 lb 1.6 oz (80.786 kg)  BMI 23.50 kg/m2 Exam: General: NAD HEENT: AT/[place], sclera clear, EOMI, PERRL, o/p clear Cardiovascular: Regular rhythm, rate 110s, no m/r/g Respiratory: CTAB except decreased aeration in bases bilaterally, normal effort Abdomen: Soft, nontender, distended moderately, no obvious organomegaly but limited by distension, trace pitting edema abdomen Extremities: 3+ LE edema to knees, pitting; no asymmetry or calf tenderness Skin: No rash or cyanosis, no bruising Neuro: Awake, alert, grossly nonfocal, moves all extremities normally  Labs and Imaging: CBC BMET  No results for input(s): WBC, HGB, HCT, PLT in the last 168 hours. No results for input(s): NA, K, CL, CO2, BUN, CREATININE, GLUCOSE, CALCIUM in the last 168 hours.     Hilton Sinclair, MD 06/22/2014, 12:08 PM PGY-3, [place] Intern pager: [phone], text pages welcome

## 2014-06-22 NOTE — Assessment & Plan Note (Signed)
Sounds related to GERD. Could be pneumonia with inadequate response due to HIV status.  Chest x-ray  Recommended restart omeprazole

## 2014-06-22 NOTE — H&P (Signed)
Call Pager [phone] for any questions or notifications regarding this patient  [facility]MTS Attending Admission Note: Dorcas Mcmurray MD Attending pager:[phone] [phone] I  have seen and examined this patient, reviewed their chart. I have discussed this patient with the resident. Please see Dr. Thereasa Parkin History and Physical  for full details.. I agree with the resident's findings, assessment and care plan. Extremely poor historian, has been hospspitalized twice since [place] another [place] yet has very few details of his current medical history. He reports being on coumadin for DVT--brings a lab result from a few weeks ago where his INR was 4--the instructions told him to hold coumadin for 2 days, restart and then recheck. Does not look like he did any of that.  1. Supratheraputic INR =8 2. Abdominal discmfort and bloating several weeks with abdomen distended on my exam. Diffusely mildly tender but no rebound and only mild guarding. 3. Cough in a patient who has known HIV disease and has been off his antiretrivirals and prophylactic meds for unknown amount of time.  Given all of these issues (and especially the lack of cogent understanding of his recent issues), I think we need to admit and work up aggressively with CT chest (rule out infectious process given lack of prophylactic meds) and CT abdomen as his exam is not normal--he cold have small bleed given INR.

## 2014-06-22 NOTE — Patient Instructions (Addendum)
Thank you for coming to see me today. It was a pleasure. Today we talked about:   Cough: I will get a chest x-ray. Also, please continue to take the Prilosec  Abdominal bloating: I will want to go through your hospital course. My exam was normal.   Please make an appointment to see me in 2 weeks for follow-up.  If you have any questions or concerns, please do not hesitate to call the office at [phone].  Sincerely,  Cordelia Poche, MD

## 2014-06-22 NOTE — Progress Notes (Signed)
ANTICOAGULATION CONSULT NOTE - Initial Consult  Pharmacy Consult for warfarin Indication: DVT  Allergies  Allergen Reactions  . Amoxicillin     REACTION: diffuse rash    Patient Measurements:    Vital Signs: Temp: 97.7 F (36.5 C) (03/21 0931) Temp Source: Oral (03/21 0931) BP: 127/93 mmHg (03/21 0931) Pulse Rate: 117 (03/21 0931)  Labs:  Recent Labs  06/22/14 1110 06/22/14 1408  LABPROT 56.4*  --   INR 6.35* >8.0    CrCl cannot be calculated (Patient has no serum creatinine result on file.).   Medical History: Past Medical History  Diagnosis Date  . HIV infection     a. 02/13/2014 CD4 = 240 - undetectable viral load.  . Hyperlipidemia   . Hypertension   . Insomnia   . Dysphagia   . Achalasia   . Renal abscess   . Hemorrhoids, internal   . Arthritis   . Premature ventricular contractions   . Pancytopenia   . CKD (chronic kidney disease), stage II   . Lupus nephritis   . SLE (systemic lupus erythematosus)   . Candida infection, esophageal     a. 2012 - noted on EGD.    Medications:  Prescriptions prior to admission  Medication Sig Dispense Refill Last Dose  . abacavir (ZIAGEN) 300 MG tablet Take 2 tablets (600 mg total) by mouth daily. 60 tablet 0 Taking  . acetaminophen (TYLENOL) 500 MG tablet Take 500 mg by mouth every 6 (six) hours as needed for mild pain.   Past Month at Unknown time  . amLODipine (NORVASC) 10 MG tablet Take 1 tablet (10 mg total) by mouth daily. 30 tablet 11 Taking  . atorvastatin (LIPITOR) 40 MG tablet Take 1 tablet (40 mg total) by mouth daily. 30 tablet 0 Taking  . cyclobenzaprine (FLEXERIL) 5 MG tablet Take 5 mg by mouth 3 (three) times daily as needed for muscle spasms.   Not Taking  . dolutegravir (TIVICAY) 50 MG tablet Take 1 tablet (50 mg total) by mouth 2 (two) times daily. 60 tablet 0 Taking  . lamiVUDine (EPIVIR) 10 MG/ML solution Take 10 mLs (100 mg total) by mouth daily. 240 mL 12 Taking  . omeprazole (PRILOSEC) 20 MG  capsule Take 1 capsule (20 mg total) by mouth daily. (Patient not taking: Reported on 06/22/2014) 30 capsule 2 Not Taking  . predniSONE (DELTASONE) 20 MG tablet Take 3 tablets (60 mg total) by mouth daily with breakfast. (Patient not taking: Reported on 06/22/2014) 90 tablet 0 Not Taking  . sodium bicarbonate 650 MG tablet Take 2 tablets (1,300 mg total) by mouth 2 (two) times daily. (Patient not taking: Reported on 06/22/2014) 60 tablet 0 Not Taking  . sulfamethoxazole-trimethoprim (BACTRIM DS,SEPTRA DS) 800-160 MG per tablet Take 1 tablet by mouth 3 (three) times a week. (Patient not taking: Reported on 06/22/2014) 12 tablet 0 Not Taking  . traMADol (ULTRAM) 50 MG tablet Take 1 tablet (50 mg total) by mouth every 8 (eight) hours as needed. (Patient not taking: Reported on 06/22/2014) 60 tablet 0 Not Taking  . warfarin (COUMADIN) 5 MG tablet Take 5 mg by mouth. Take as directed   Taking    Assessment: 80 yom presented to the hospital after he was found to have a supratherapeutic INR (6.35). No bleeding noted. No CBC available yet.   Goal of Therapy:  INR 2-3 Monitor platelets by anticoagulation protocol: Yes   Plan:  - No coumadin tonight - Daily INR  Mounir Skipper, Rande Lawman 06/22/2014,3:26 PM

## 2014-06-22 NOTE — Consult Note (Signed)
[facility]    Date of Admission:  06/22/2014          Reason for Consult: HIV infection    Referring Physician: Dr. Dorcas Mcmurray  Principal Problem:   Supratherapeutic INR Active Problems:   HIV disease   Cough   Anemia   Essential hypertension   Chronic kidney disease (CKD), stage II (mild)   Protein-calorie malnutrition, severe   Lupus nephritis   Anasarca   Abdominal distension   Essential hypertension, benign   . amLODipine  5 mg Oral Daily  . atorvastatin  40 mg Oral q1800  . pantoprazole  40 mg Oral Daily  . sodium chloride  3 mL Intravenous Q12H  . [START ON 06/23/2014] Warfarin - Pharmacist Dosing Inpatient   Does not apply q1800    Recommendations: 1. Continue abacavir 600 mg daily, lamivudine 100 mg daily and dolutegravir 50 mg daily  2. Fluconazole 100 mg weekly 3. Obtain records for recent hospitalizations at [facility] in [place] 4. Chest x-ray 5. Repeat CD4 count and HIV viral load  Assessment: I will restart [name] 3 drug antiretroviral regimen and obtain repeat CD4 and HIV viral load. It would be beneficial to obtain copies of his records from his recent hospitalizations if not already done. If his CD4 count remains below 200 he will need to start pneumocystis prophylaxis and if below 100 he will need to start on prophylaxis for Mycobacterium avium. Given that he recently was told that he had candidal esophagitis I will go ahead and put him on once weekly fluconazole. A chest x-ray is pending to investigate his recent dry cough. I do not know his neurologic baseline but I am concerned that he may be having some cognitive decline that could impact adherence. He has a great deal of difficulty describing what happened to him in [place].   HPI: Kenneth [name] is a 59 y.o. male with HIV infection who has been followed by my partner, Dr. Lita Mains. He was receiving zidovudine-lamivudine and efavarenz up  until last fall when he was diagnosed with SLE. At that time his antiretroviral regimen was changed to abacavir, lamivudine and dolutegravir. Previous notes indicate that his adherence has generally been very good and viral suppression have been excellent. When he was hospitalized last November his viral load activated slightly to 109 and his CD4 count was down to 100. He states that he went to visit friends in [place] in December. While there he had 2 hospitalizations. He has some difficulties recalling exactly why he was hospitalized but it sounds like he had an acute DVT and some leg swelling. He was started on blood thinners. He also had difficulty swallowing and was told that he had candidal esophagitis. It sounds like he had difficulty getting his medications while he was there. He recently returned home to [facility] and states that he has been taking his HIV medicines faithfully since then. He's had a dry cough for the past several weeks.   Review of Systems: Constitutional: positive for malaise, negative for anorexia, chills, fevers and weight loss Eyes: negative Ears, nose, mouth, throat, and face: negative Respiratory: positive for cough, negative for dyspnea on exertion, pleurisy/chest pain and sputum Cardiovascular: negative Gastrointestinal: negative, but admit note indicates that he had a liquid stool this morning Genitourinary:negative  Past Medical History  Diagnosis Date  . HIV infection dx ~ 2009    a. 02/13/2014 CD4 =  240 - undetectable viral load.  . Hyperlipidemia   . Hypertension   . Insomnia   . Dysphagia   . Achalasia   . Renal abscess   . Hemorrhoids, internal   . Premature ventricular contractions   . Pancytopenia   . CKD (chronic kidney disease), stage II   . Lupus nephritis   . SLE (systemic lupus erythematosus) dx'd 2015  . Candida infection, esophageal     a. 2012 - noted on EGD.  Marland Kitchen DVT (deep venous thrombosis) ~ 04/2014  . History of blood transfusion  03/2014    "low counts"  . History of stomach ulcers     History  Substance Use Topics  . Smoking status: Never Smoker   . Smokeless tobacco: Never Used  . Alcohol Use: No    Family History  Problem Relation Age of Onset  . Colon cancer Father     deceased  . Other Mother     s/p pacemaker - alive and well.  . Other      5 brothers, 3 sisters - alive and well.   Allergies  Allergen Reactions  . Amoxicillin     REACTION: diffuse rash    OBJECTIVE: Blood pressure 147/96, pulse 97, temperature 98.3 F (36.8 C), temperature source Oral, resp. rate 17, height 6\' 1"  (1.854 m), weight 173 lb 9.6 oz (78.744 kg), SpO2 86 %. General: he is alert and in no distress Skin: no rash Oral: No thrush or other oropharyngeal lesions Lungs: clear. No increase work of breathing Cor: regular S1 and S2 with no murmur Abdomen: soft and nontender. Possible fluid wave. Extremities: 2+ pitting edema to the level of the knees Neuro: He is slow to answer questions and seems to be searching for words. He has poor eye contact.  Lab Results Lab Results  Component Value Date   WBC 3.4* 03/23/2014   HGB 8.4* 03/23/2014   HCT 25.8* 03/23/2014   MCV 91.8 03/23/2014   PLT 152 03/23/2014    Lab Results  Component Value Date   CREATININE 2.77* 03/20/2014   BUN 41* 03/20/2014   NA 139 03/20/2014   K 3.8 03/20/2014   CL 104 03/20/2014   CO2 24 03/20/2014    Lab Results  Component Value Date   ALT 6 03/19/2014   AST 8 03/19/2014   ALKPHOS 48 03/19/2014   BILITOT <0.2* 03/19/2014     Microbiology: No results found for this or any previous visit (from the past 240 hour(s)).  Michel Bickers, MD [facility] [facility] [phone] pager   [phone] cell 06/22/2014, 5:14 PM

## 2014-06-22 NOTE — Assessment & Plan Note (Signed)
Patient diagnosed while in [place]. Currently on Coumadin therapy. INR check in the clinic was >8. Lab order sent and discussed inpatient admission/observation for severely elevated INR.

## 2014-06-22 NOTE — Progress Notes (Signed)
Patient - direct admit arrived on the floor in 5W21 via wheelchair; alert and oriented x 4; no complaints of pain; IV was started in LAC per protocol; skin intact. Orient patient to room and unit; safety video - patient refused to watch; gave patient care guide; instructed how to use the call bell and  fall risk precautions. Will continue to monitor the patient.

## 2014-06-22 NOTE — Progress Notes (Signed)
    Subjective    Kenneth [name] is a 59 y.o. male that presents for an office visit.   1. Hospital follow-up: Patient was recently hospitalized twice. First time for one week and second time for two weeks, while in [place] at [place] to the hospital because of difficulty swallowing. His difficulty prevented him from taking his medications as prescribed. Was found to have a DVT in left leg and was started on Coumadin. He also remembers receiving a blood transfusion but is unsure of why. No new leg swelling. No chest pain or shortness of breath.  2. Stomach bloating: Symptoms started about one month ago. Has bloating and people have commented on how it looks big. He has crampy pain. Patient feels a lump around his RUQ. He has been having loose stools in the past 3-4 weeks. He received antibiotics while in the hospital. No fevers.   3. Cough: Symptoms started a few weeks ago. Productive sputum with some hemoptysis at times. No chest pain or shortness of breath. Afebrile. States he has a history of candidal esophagitis recently.  History  Substance Use Topics  . Smoking status: Never Smoker   . Smokeless tobacco: Never Used  . Alcohol Use: No    Allergies  Allergen Reactions  . Amoxicillin     REACTION: diffuse rash    No orders of the defined types were placed in this encounter.    ROS  Per HPI   Objective   BP 127/93 mmHg  Pulse 117  Temp(Src) 97.7 F (36.5 C) (Oral)  Ht 6\' 1"  (1.854 m)  Wt 178 lb 1.6 oz (80.786 kg)  BMI 23.50 kg/m2  General: Fair appearing, no distress Respiratory/Chest: Clear to auscultation bilaterally with diminished breath sounds at bilateral bases Gastrointestinal: Soft, non-tender and non-distended abdomen. No masses felt. Could not appreciate organomegaly. No rebound.  Assessment and Plan   Please refer to problem based charting of assessment and plan

## 2014-06-22 NOTE — Assessment & Plan Note (Signed)
Blood pressure slightly above goal today  No changes

## 2014-06-23 ENCOUNTER — Observation Stay (HOSPITAL_COMMUNITY): Payer: BC Managed Care – PPO

## 2014-06-23 ENCOUNTER — Encounter (HOSPITAL_COMMUNITY): Payer: Self-pay | Admitting: Physician Assistant

## 2014-06-23 DIAGNOSIS — R791 Abnormal coagulation profile: Secondary | ICD-10-CM | POA: Diagnosis not present

## 2014-06-23 LAB — TSH: TSH: 2.944 u[IU]/mL (ref 0.350–4.500)

## 2014-06-23 LAB — BASIC METABOLIC PANEL
ANION GAP: 9 (ref 5–15)
BUN: 62 mg/dL — ABNORMAL HIGH (ref 6–23)
CO2: 20 mmol/L (ref 19–32)
Calcium: 7.7 mg/dL — ABNORMAL LOW (ref 8.4–10.5)
Chloride: 112 mmol/L (ref 96–112)
Creatinine, Ser: 9.13 mg/dL — ABNORMAL HIGH (ref 0.50–1.35)
GFR calc Af Amer: 7 mL/min — ABNORMAL LOW (ref >90)
GFR, EST NON AFRICAN AMERICAN: 6 mL/min — AB (ref >90)
Glucose, Bld: 93 mg/dL (ref 70–99)
Potassium: 5.4 mmol/L — ABNORMAL HIGH (ref 3.5–5.1)
SODIUM: 141 mmol/L (ref 135–145)

## 2014-06-23 LAB — HEPATITIS PANEL, ACUTE
HCV Ab: NEGATIVE
Hep A IgM: NONREACTIVE
Hep B C IgM: NONREACTIVE
Hepatitis B Surface Ag: NEGATIVE

## 2014-06-23 LAB — OCCULT BLOOD X 1 CARD TO LAB, STOOL: Fecal Occult Bld: NEGATIVE

## 2014-06-23 LAB — CBC
HCT: 27.2 % — ABNORMAL LOW (ref 39.0–52.0)
Hemoglobin: 8.7 g/dL — ABNORMAL LOW (ref 13.0–17.0)
MCH: 29.2 pg (ref 26.0–34.0)
MCHC: 32 g/dL (ref 30.0–36.0)
MCV: 91.3 fL (ref 78.0–100.0)
PLATELETS: 289 10*3/uL (ref 150–400)
RBC: 2.98 MIL/uL — ABNORMAL LOW (ref 4.22–5.81)
RDW: 14.8 % (ref 11.5–15.5)
WBC: 7 10*3/uL (ref 4.0–10.5)

## 2014-06-23 LAB — IRON AND TIBC
IRON: 37 ug/dL — AB (ref 42–165)
SATURATION RATIOS: 41 % (ref 20–55)
TIBC: 91 ug/dL — ABNORMAL LOW (ref 215–435)
UIBC: 54 ug/dL — ABNORMAL LOW (ref 125–400)

## 2014-06-23 LAB — PROTIME-INR
INR: 7.4 (ref 0.00–1.49)
Prothrombin Time: 63.5 s — ABNORMAL HIGH (ref 11.6–15.2)

## 2014-06-23 LAB — T-HELPER CELLS (CD4) COUNT (NOT AT ARMC)
CD4 % Helper T Cell: 24 % — ABNORMAL LOW (ref 33–55)
CD4 T Cell Abs: 370 /uL — ABNORMAL LOW (ref 400–2700)

## 2014-06-23 MED ORDER — PANTOPRAZOLE SODIUM 40 MG PO TBEC
40.0000 mg | DELAYED_RELEASE_TABLET | Freq: Two times a day (BID) | ORAL | Status: DC
  Administered 2014-06-23 – 2014-06-26 (×6): 40 mg via ORAL
  Filled 2014-06-23 (×5): qty 1

## 2014-06-23 MED ORDER — SODIUM POLYSTYRENE SULFONATE 15 GM/60ML PO SUSP
30.0000 g | Freq: Once | ORAL | Status: AC
  Administered 2014-06-23: 30 g via ORAL
  Filled 2014-06-23: qty 120

## 2014-06-23 NOTE — Consult Note (Signed)
Referring Provider: No ref. provider found Primary Care Physician:  Cordelia Poche, MD Primary Nephrologist:  Dr. Jimmy Footman   Reason for Consultation:  Acute on chronic renal disease with anasarca and abdominal distention. Anemia and history of lupus and HIV HPI:  This is a man with abdominal distention that was seen in the clinic and sent to the hospital due to new onset abdominal distention. He was told in the past in [place] that he would need dialysis soon. Following an admission for DVT. He was found to have a supertherapeutic INR.  In December he had a creatinine of over 4 and this has progressed to over 9. In October 2010 he had hydronephrosis and required a nephrosotomy. He has been treated with fairly high dose steroids in December with a biopsy consistent with a collapsing FSGS.  Past Medical History  Diagnosis Date  . HIV infection dx ~ 2009    a. 02/13/2014 CD4 = 240 - undetectable viral load.  . Hyperlipidemia   . Hypertension   . Insomnia   . Dysphagia 2008    post heller myotomy/toupee fundoplication.    . Achalasia   . Renal abscess   . Hemorrhoids, internal   . Premature ventricular contractions   . Pancytopenia   . CKD (chronic kidney disease), stage II   . Lupus nephritis   . SLE (systemic lupus erythematosus) dx'd 2015  . Candida infection, esophageal 08/2010    a. 2012 - noted on EGD.  Marland Kitchen DVT (deep venous thrombosis) ~ 04/2014  . History of blood transfusion 03/2014    "low counts"  . History of stomach ulcers     Past Surgical History  Procedure Laterality Date  . Nephrectomy Right ~ 2011  . Colonoscopy  2008    .  tortuous colon, internal hemorrhoids.  no polyps or diverticulosis.   . Esophagogastroduodenoscopy  08/2010    Dr [name].  08/2010 candida esophagitis, no obvious esophageal stricture. 02/2006 and 05/2006 dilated esophagus, narrowed distal esophagus but no stricture, was empirically Savary dilated  . Heller myotomy  1999    with toupee  fundoplication of HH.     Prior to Admission medications   Medication Sig Start Date End Date Taking? Authorizing Provider  abacavir (ZIAGEN) 300 MG tablet Take 2 tablets (600 mg total) by mouth daily. 04/21/14  Yes Patrecia Pour, MD  acetaminophen (TYLENOL) 500 MG tablet Take 500 mg by mouth every 6 (six) hours as needed for mild pain.   Yes Historical Provider, MD  amLODipine (NORVASC) 10 MG tablet Take 1 tablet (10 mg total) by mouth daily. 01/20/14  Yes Mariel Aloe, MD  atorvastatin (LIPITOR) 40 MG tablet Take 1 tablet (40 mg total) by mouth daily. 04/20/14  Yes Mariel Aloe, MD  cyclobenzaprine (FLEXERIL) 5 MG tablet Take 5 mg by mouth 3 (three) times daily as needed for muscle spasms.   Yes Historical Provider, MD  dolutegravir (TIVICAY) 50 MG tablet Take 1 tablet (50 mg total) by mouth 2 (two) times daily. 04/20/14  Yes Mariel Aloe, MD  lamiVUDine (EPIVIR) 10 MG/ML solution Take 10 mLs (100 mg total) by mouth daily. 03/20/14  Yes Ashly Windell Moulding, DO  warfarin (COUMADIN) 5 MG tablet Take 2.5-5 mg by mouth See admin instructions. Take 5mg  tablet on Sunday, Tuesday, Thursday,and Saturday. Take 2.5mg  all other days   Yes Historical Provider, MD  omeprazole (PRILOSEC) 20 MG capsule Take 1 capsule (20 mg total) by mouth daily. Patient not taking: Reported on  06/22/2014 03/05/14   Mariel Aloe, MD  predniSONE (DELTASONE) 20 MG tablet Take 3 tablets (60 mg total) by mouth daily with breakfast. Patient not taking: Reported on 06/22/2014 02/18/14   [place]burn N Rumley, DO  sodium bicarbonate 650 MG tablet Take 2 tablets (1,300 mg total) by mouth 2 (two) times daily. Patient not taking: Reported on 06/22/2014 03/20/14   Janora Norlander, DO  sulfamethoxazole-trimethoprim (BACTRIM DS,SEPTRA DS) 800-160 MG per tablet Take 1 tablet by mouth 3 (three) times a week. Patient not taking: Reported on 06/22/2014 03/20/14   Janora Norlander, DO  traMADol (ULTRAM) 50 MG tablet Take 1 tablet (50 mg total)  by mouth every 8 (eight) hours as needed. Patient not taking: Reported on 06/22/2014 02/02/14   Nolon Rod, DO    Current Facility-Administered Medications  Medication Dose Route Frequency Provider Last Rate Last Dose  . 0.9 %  sodium chloride infusion  250 mL Intravenous PRN Hilton Sinclair, MD      . abacavir [facility]) tablet 600 mg  600 mg Oral Daily Michel Bickers, MD   600 mg at 06/23/14 1022  . amLODipine (NORVASC) tablet 5 mg  5 mg Oral Daily Hilton Sinclair, MD   5 mg at 06/23/14 1022  . atorvastatin (LIPITOR) tablet 40 mg  40 mg Oral q1800 Hilton Sinclair, MD   40 mg at 06/22/14 1817  . dolutegravir (TIVICAY) tablet 50 mg  50 mg Oral Daily Michel Bickers, MD   50 mg at 06/23/14 1022  . fluconazole (DIFLUCAN) tablet 100 mg  100 mg Oral Weekly Michel Bickers, MD   100 mg at 06/22/14 1817  . lamiVUDine (EPIVIR) 10 MG/ML solution 100 mg  100 mg Oral Daily Michel Bickers, MD   100 mg at 06/23/14 1022  . pantoprazole (PROTONIX) EC tablet 40 mg  40 mg Oral Daily Hilton Sinclair, MD   40 mg at 06/23/14 1022  . sodium chloride 0.9 % injection 3 mL  3 mL Intravenous Q12H Hilton Sinclair, MD   3 mL at 06/23/14 1024  . sodium chloride 0.9 % injection 3 mL  3 mL Intravenous PRN Hilton Sinclair, MD   3 mL at 06/22/14 2317  . Warfarin - Pharmacist Dosing Inpatient   Does not apply q1800 Valeda Malm Rumbarger, RPH   0 each at 06/23/14 1800    Allergies as of 06/22/2014 - Review Complete 06/22/2014  Allergen Reaction Noted  . Amoxicillin  11/30/2008    Family History  Problem Relation Age of Onset  . Colon cancer Father     deceased  . Other Mother     s/p pacemaker - alive and well.  . Other      5 brothers, 3 sisters - alive and well.    History   Social History  . Marital Status: Single    Spouse Name: N/A  . Number of Children: N/A  . Years of Education: N/A   Occupational History  . Not on file.   Social History Main Topics  . Smoking status: Never  Smoker   . Smokeless tobacco: Never Used  . Alcohol Use: No  . Drug Use: No  . Sexual Activity:    Partners: Male   Other Topics Concern  . Not on file   Social History Narrative   Lives in [place] by himself. Does not routinely exercise.    Review of Systems: Gen: Denies any fever, chills, sweats, anorexia, fatigue, weakness, malaise, weight loss,  and sleep disorder HEENT: No visual complaints, No history of Retinopathy. Normal external appearance No Epistaxis or Sore throat. No sinusitis.   CV: Denies chest pain, angina, palpitations, syncope, orthopnea, PND, peripheral edema, and claudication. Resp: Denies dyspnea at rest, dyspnea with exercise, cough, sputum, wheezing, coughing up blood, and pleurisy. GI:  Abdominal distention GU : Denies urinary burning, blood in urine, urinary frequency, urinary hesitancy, nocturnal urination, and urinary incontinence.  No renal calculi. MS: Denies joint pain, limitation of movement, and swelling, stiffness, low back pain, extremity pain. Denies muscle weakness, cramps, atrophy.  No use of non steroidal antiinflammatory drugs. Derm: Denies rash, itching, dry skin, hives, moles, warts, or unhealing ulcers.  Psych: Denies depression, anxiety, memory loss, suicidal ideation, hallucinations, paranoia, and confusion. Heme: Denies bruising, bleeding, and enlarged lymph nodes. Neuro: No headache.  No diplopia. No dysarthria.  No dysphasia.  No history of CVA.  No Seizures. No paresthesias.  No weakness. Endocrine No DM.  No Thyroid disease.  No Adrenal disease.  Physical Exam: Vital signs in last 24 hours: Temp:  [97.4 F (36.3 C)-98.3 F (36.8 C)] 98.2 F (36.8 C) (03/22 0546) Pulse Rate:  [70-97] 80 (03/22 0546) Resp:  [14-17] 15 (03/22 0546) BP: (141-147)/(93-96) 146/93 mmHg (03/22 0546) SpO2:  [86 %-100 %] 100 % (03/22 0546) Weight:  [78.744 kg (173 lb 9.6 oz)] 78.744 kg (173 lb 9.6 oz) (03/21 1700) Last BM Date: 06/21/14 General:   Alert,   Well-developed, well-nourished, pleasant and cooperative in NAD Head:  Normocephalic and atraumatic. Eyes:  Sclera clear, no icterus.   Conjunctiva pink. Ears:  Normal auditory acuity. Nose:  No deformity, discharge,  or lesions. Mouth:  No deformity or lesions, dentition normal. Neck:  Supple; no masses or thyromegaly. JVP not elevated Lungs:  Clear throughout to auscultation.   No wheezes, crackles, or rhonchi. No acute distress. Heart:  Regular rate and rhythm; no murmurs, clicks, rubs,  or gallops. Abdomen:  Soft distended  Msk:  Symmetrical without gross deformities. Normal posture. Pulses:  No carotid, renal, femoral bruits. DP and PT symmetrical and equal Extremities:  Without clubbing or edema. Neurologic:  Alert and  oriented x4;  grossly normal neurologically. Skin:  Intact without significant lesions or rashes. Cervical Nodes:  No significant cervical adenopathy. Psych:  Alert and cooperative. Normal mood and affect.  Intake/Output from previous day: 03/21 0701 - 03/22 0700 In: 220 [P.O.:220] Out: 150 [Urine:150] Intake/Output this shift: Total I/O In: 300 [P.O.:300] Out: -   Lab Results:  Recent Labs  06/22/14 1700 06/22/14 2006 06/23/14 0645  WBC 3.2* 5.6 7.0  HGB 18.2* 10.0* 8.7*  HCT 54.8* 30.3* 27.2*  PLT 142* 258 289   BMET  Recent Labs  06/22/14 1700 06/22/14 2006 06/23/14 0645  NA 143 140 141  K 5.3* 5.5* 5.4*  CL 113* 113* 112  CO2 20 17* 20  GLUCOSE 88 107* 93  BUN 64* 65* 62*  CREATININE 9.56* 9.12* 9.13*  CALCIUM 7.8* 7.8* 7.7*   LFT  Recent Labs  06/22/14 1700  PROT 6.3  ALBUMIN 1.1*  AST 16  ALT 11  ALKPHOS 63  BILITOT 0.3   PT/INR  Recent Labs  06/22/14 1110 06/22/14 1408 06/23/14 0645  LABPROT 56.4*  --  63.5*  INR 6.35* >8.0 7.40*   Hepatitis Panel No results for input(s): HEPBSAG, HCVAB, HEPAIGM, HEPBIGM in the last 72 hours.  Studies/Results: Ct Abdomen Pelvis Wo Contrast  06/22/2014   CLINICAL DATA:   Abdominal distension. Cough.  On Coumadin for DVT. Abdominal discomfort and bloating for several weeks. HIV. Chronic kidney disease. Lupus. History of blood transfusion and gastric ulcers.  EXAM: CT CHEST, ABDOMEN AND PELVIS WITHOUT CONTRAST  TECHNIQUE: Multidetector CT imaging of the chest, abdomen and pelvis was performed following the standard protocol without IV contrast.  COMPARISON:  Abdominal pelvic CT of 03/16/2014. Chest radiograph 02/09/2014. Chest CT of 01/18/2009.  FINDINGS: Exam is moderately degraded secondary to lack of IV contrast, paucity of oral contrast, paucity of fat, diffuse anasarca.  CT CHEST FINDINGS  Mediastinum/Nodes: No axillary adenopathy. Upper normal heart size. Anemia, as evidenced by decreased density in the intravascular space. Small pericardial effusion is new. No gross mediastinal adenopathy. Hilar regions poorly evaluated without intravenous contrast. Surgical changes at the gastroesophageal junction. Moderate esophageal dilatation throughout. Fluid/debris/air level within. This is grossly similar to the remote prior exam.  Lungs/Pleura: Small bilateral pleural effusions. Bibasilar subsegmental atelectasis. Minimal lingular nodularity on image 36 is unchanged since 2010, benign.  Musculoskeletal: No acute osseous abnormality.  CT ABDOMEN PELVIS FINDINGS  Hepatobiliary: Apparent irregular hepatic capsule could be technique related. Multiple gallstones without gallbladder wall thickening or biliary ductal dilatation.  Pancreas: Grossly normal pancreas.  Spleen: Normal  Adrenals/Urinary Tract: Right nephrectomy. No left adrenal mass. No left-sided hydronephrosis or hydroureter. Normal urinary bladder.  Stomach/Bowel: Surgical changes at the gastroesophageal junction. Otherwise, grossly normal appearance of the stomach. Normal caliber of large and small bowel loops.  Vascular/Lymphatic: Normal caliber of the aorta and branch vessels. Limited evaluation for retroperitoneal adenopathy.  No pelvic sidewall adenopathy.  Reproductive: Grossly normal prostate.  Other: Since 03/16/2014, development of moderate volume abdominal pelvic ascites. Diffuse anasarca.  Musculoskeletal: Degenerative partial fusion of the bilateral sacroiliac joints. Scattered pelvic bone islands. S-shaped thoracolumbar spine curvature.  IMPRESSION: CT CHEST IMPRESSION  1. Moderate degradation, secondary to lack of contrast, paucity of fat, and extent of anasarca. 2. Small bilateral pleural effusions with adjacent subsegmental atelectasis. 3. New pericardial effusion, small. 4. Chronic esophageal dilatation with fluid and debris within. This suggests a component of obstruction at the gastroesophageal junction and/or esophageal dysmotility.  CT ABDOMEN AND PELVIS IMPRESSION  1. Development of moderate abdominal pelvic ascites. 2. Apparent irregular hepatic capsule which could be CT artifactual. Interval development of cirrhosis since 03/16/2014 is felt unlikely. Correlate with risk factors. 3. Cholelithiasis. 4. Anasarca.   Electronically Signed   By: Abigail Miyamoto M.D.   On: 06/22/2014 19:07   Ct Chest Wo Contrast  06/22/2014   CLINICAL DATA:  Abdominal distension. Cough. On Coumadin for DVT. Abdominal discomfort and bloating for several weeks. HIV. Chronic kidney disease. Lupus. History of blood transfusion and gastric ulcers.  EXAM: CT CHEST, ABDOMEN AND PELVIS WITHOUT CONTRAST  TECHNIQUE: Multidetector CT imaging of the chest, abdomen and pelvis was performed following the standard protocol without IV contrast.  COMPARISON:  Abdominal pelvic CT of 03/16/2014. Chest radiograph 02/09/2014. Chest CT of 01/18/2009.  FINDINGS: Exam is moderately degraded secondary to lack of IV contrast, paucity of oral contrast, paucity of fat, diffuse anasarca.  CT CHEST FINDINGS  Mediastinum/Nodes: No axillary adenopathy. Upper normal heart size. Anemia, as evidenced by decreased density in the intravascular space. Small pericardial effusion  is new. No gross mediastinal adenopathy. Hilar regions poorly evaluated without intravenous contrast. Surgical changes at the gastroesophageal junction. Moderate esophageal dilatation throughout. Fluid/debris/air level within. This is grossly similar to the remote prior exam.  Lungs/Pleura: Small bilateral pleural effusions. Bibasilar subsegmental atelectasis. Minimal lingular nodularity on image 36 is  unchanged since 2010, benign.  Musculoskeletal: No acute osseous abnormality.  CT ABDOMEN PELVIS FINDINGS  Hepatobiliary: Apparent irregular hepatic capsule could be technique related. Multiple gallstones without gallbladder wall thickening or biliary ductal dilatation.  Pancreas: Grossly normal pancreas.  Spleen: Normal  Adrenals/Urinary Tract: Right nephrectomy. No left adrenal mass. No left-sided hydronephrosis or hydroureter. Normal urinary bladder.  Stomach/Bowel: Surgical changes at the gastroesophageal junction. Otherwise, grossly normal appearance of the stomach. Normal caliber of large and small bowel loops.  Vascular/Lymphatic: Normal caliber of the aorta and branch vessels. Limited evaluation for retroperitoneal adenopathy. No pelvic sidewall adenopathy.  Reproductive: Grossly normal prostate.  Other: Since 03/16/2014, development of moderate volume abdominal pelvic ascites. Diffuse anasarca.  Musculoskeletal: Degenerative partial fusion of the bilateral sacroiliac joints. Scattered pelvic bone islands. S-shaped thoracolumbar spine curvature.  IMPRESSION: CT CHEST IMPRESSION  1. Moderate degradation, secondary to lack of contrast, paucity of fat, and extent of anasarca. 2. Small bilateral pleural effusions with adjacent subsegmental atelectasis. 3. New pericardial effusion, small. 4. Chronic esophageal dilatation with fluid and debris within. This suggests a component of obstruction at the gastroesophageal junction and/or esophageal dysmotility.  CT ABDOMEN AND PELVIS IMPRESSION  1. Development of moderate  abdominal pelvic ascites. 2. Apparent irregular hepatic capsule which could be CT artifactual. Interval development of cirrhosis since 03/16/2014 is felt unlikely. Correlate with risk factors. 3. Cholelithiasis. 4. Anasarca.   Electronically Signed   By: Abigail Miyamoto M.D.   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 We discussed dialysis and the patient is very resistant to start. He understands that he has now progressed to end stage but is bargaining for more time to make a decision. He appears to want to live and will agree to dialysis eventually.   Anemia check iron stores  Bones check PTH  HTN controlled at present  I shall order vein mapping   LOS: 1 Caraline Deutschman W @TODAY @10 :56 AM

## 2014-06-23 NOTE — Progress Notes (Signed)
INITIAL NUTRITION ASSESSMENT  DOCUMENTATION CODES Per approved criteria  -Not Applicable   INTERVENTION: Encourage PO intake   NUTRITION DIAGNOSIS: Increased nutrient needs related to chronic illness as evidenced by estimated energy needs.   Goal: Pt to meet >/= 90% of estimated energy needs  Monitor:  PO intake, weight trends, labs  Reason for Assessment: C/s for assessment of nutrition requirements/status  59 y.o. male  Admitting Dx: Supratherapeutic INR  ASSESSMENT: 59 y/o male with history of lupus and HIV that was seen in the clinic and sent to the hospital d/t abdominal distention and found to have a supratherapeutic INR.   Weight history reveals weight gain over the past three months, potentially due to fluid accumulation. No clubbing or edema in extremities.Pt denied any wt loss or changes in appetite. His current PO intake is 100%. He reported that his clothes have been fitting normally, and denied any nausea, vomiting or abdominal pain. Pt was not interested in trying any supplements (Nepro).   Continue to monitor PO intake. Labs and medications reviewed: Elevated BUN, Creatinine  Low Ca, K   Height: Ht Readings from Last 1 Encounters:  06/22/14 6\' 1"  (1.854 m)    Weight: Wt Readings from Last 1 Encounters:  06/22/14 173 lb 9.6 oz (78.744 kg)    Ideal Body Weight: 184 lb (83.6 kg)  % Ideal Body Weight: 94%  Wt Readings from Last 10 Encounters:  06/22/14 173 lb 9.6 oz (78.744 kg)  06/22/14 178 lb 1.6 oz (80.786 kg)  03/23/14 134 lb 9.6 oz (61.054 kg)  03/16/14 133 lb (60.328 kg)  03/05/14 146 lb (66.225 kg)  02/16/14 158 lb (71.668 kg)  02/02/14 146 lb (66.225 kg)  01/20/14 155 lb 4.8 oz (70.444 kg)  01/02/14 157 lb (71.215 kg)  07/24/13 144 lb (65.318 kg)    Usual Body Weight: unknown  % Usual Body Weight: -  BMI:  Body mass index is 22.91 kg/(m^2). Normal  Estimated Nutritional Needs: Kcal: 1950-2200 Protein: 90-110 grams Fluid: >/=  1.7L daily  Skin: distended abdomen   Diet Order: Diet Heart  EDUCATION NEEDS: -No education needs identified at this time   Intake/Output Summary (Last 24 hours) at 06/23/14 1130 Last data filed at 06/23/14 1025  Gross per 24 hour  Intake    520 ml  Output    150 ml  Net    370 ml    Last BM: pta  Labs:   Recent Labs Lab 06/22/14 1700 06/22/14 2006 06/23/14 0645  NA 143 140 141  K 5.3* 5.5* 5.4*  CL 113* 113* 112  CO2 20 17* 20  BUN 64* 65* 62*  CREATININE 9.56* 9.12* 9.13*  CALCIUM 7.8* 7.8* 7.7*  GLUCOSE 88 107* 93    CBG (last 3)  No results for input(s): GLUCAP in the last 72 hours.  Scheduled Meds: . abacavir  600 mg Oral Daily  . amLODipine  5 mg Oral Daily  . atorvastatin  40 mg Oral q1800  . dolutegravir  50 mg Oral Daily  . fluconazole  100 mg Oral Weekly  . lamiVUDine  100 mg Oral Daily  . pantoprazole  40 mg Oral BID  . sodium chloride  3 mL Intravenous Q12H  . Warfarin - Pharmacist Dosing Inpatient   Does not apply q1800    Continuous Infusions:   Past Medical History  Diagnosis Date  . HIV infection dx ~ 2009    a. 02/13/2014 CD4 = 240 - undetectable viral load.  Marland Kitchen  Hyperlipidemia   . Hypertension   . Insomnia   . Dysphagia 2008    post heller myotomy/toupee fundoplication.    . Achalasia   . Renal abscess   . Hemorrhoids, internal   . Premature ventricular contractions   . Pancytopenia   . CKD (chronic kidney disease), stage II   . Lupus nephritis   . SLE (systemic lupus erythematosus) dx'd 2015  . Candida infection, esophageal 08/2010    a. 2012 - noted on EGD.  Marland Kitchen DVT (deep venous thrombosis) ~ 04/2014  . History of blood transfusion 03/2014    "low counts"  . History of stomach ulcers     Past Surgical History  Procedure Laterality Date  . Nephrectomy Right ~ 2011  . Colonoscopy  2008    .  tortuous colon, internal hemorrhoids.  no polyps or diverticulosis.   . Esophagogastroduodenoscopy  08/2010    Dr [name].  08/2010  candida esophagitis, no obvious esophageal stricture. 02/2006 and 05/2006 dilated esophagus, narrowed distal esophagus but no stricture, was empirically Savary dilated  . Heller myotomy  1999    with toupee fundoplication of HH.     Wynona Dove, MS Dietetic Intern Pager: [phone]

## 2014-06-23 NOTE — Progress Notes (Signed)
Family Medicine Teaching Service Daily Progress Note Intern Pager: [phone]  Patient name: Kenneth [name] Medical record number: [id number] Date of birth: 21-Dec-1955 Age: 59 y.o. Gender: male  Primary Care Provider: Cordelia Poche, MD Consultants: ID Code Status: Full  Pt Overview and Major Events to Date:  3/21: Admitted for lab abnormalities and concerning physical exam  Assessment and Plan: SIRRON LOGA is a 59 y.o. male presenting with cough and abdominal distension in clinic, found to have supratherapeutic INR to ~8 . PMH is significant for abdominal pain, anemia, DVT, CKD II, ED, HTN, hemorrhoids, HIV, HLD, lupus, PVCs, protein calorie malnutrition.  Supratherapeutic INR/Recent DVT INR in clinic >8. Value later verified at 6.3. On coumadin for DVT in January in [place], no records for verification of details. Some concern in clinic of poor understanding of meds (taking when told to stop taking) / lost to f/u. Reports no current bleeding.  - Continue to monitor - Pharmacy consulted for supratherapeutic INR. - LFTs appropriate -if continues to remain elevated or signs of bleeding consider Vit. K or FFP for reversal - Hold coumadin; consult pharmacy - Order to obtain medical records - Hold VTE pharmacologic ppx; SCDs - Up with assistance - trend PT/INR  Anasarca with associated cough Unclear etiology. No reported dyspnea. Lung exam mild decreased aeration bases. Reports LE edema is somewhat chronic. Echo 03/2014 with nl LV function and grade 1 diastolic dysfunction with trace MR and TR. Broad ddx due to chronicity (concern for malignancy), h/o HIV (opportunistic infection with unclear if taking medications). Noted a few weeks now, no tenderness, moderate tachycardia on admission. Believe patient to be intracellularly deplete but with fluid overload and renal failure do not want to give IVF at this time. - BNP 197 elevated; no prior ones to compare - CT chest with small bilateral  effusions - repeat TSH ordered - Will order echocardiogram - new small pericardial effusion - consider dx tap of abdominal fluid  - encourage PO hydration  Anemia Patient reports previously having blood transfusion about a month ago for anemia. He denies bleeding. Says they did not find a source for low Hbg. Denies any recent colonoscopy. Renal insufficiency likely playing a factor and also anemia of chronic disease. FH of colon cancer. Consider malignancy with stool character change. He has daily BM but it is a very small amount and slightly liquid. - consult GI; appreciate recs - likely needs colonoscopy and EGD - trend CBC - FOBT ordered - anemia labs ordered - consider transfusing if Hbg <7 and/or patient symtpomatic  HTN Stable - Continue home norvasc, decr to 5mg  daily due to unsure about his compliance.  CKD II: Acute on chronic renal failure Most recent creatinine 2.77 and GFR 28 03/20/14. However on admission Cr 9.56 and BUN 64. Patient is not making much urine. Possible progression of renal disease.  - trend BMP - Cr today 9.13 -nephrology consulted for possible HD - strict I/Os -hyperkalemia but avoiding treating at this time; will hopefully get HD today  HIV Unclear what medications he is taking from home med list as patient is unclear about this in clinic. Previously seen by Dr [name] (ID). States he was taking his HIV medications. - ID consulted; appreciate recs  -HAART therapy restarted -fluconazole added for recent hospitalization for esophageal candidiasis requiring esophageal dilation -CD4 count and viral load pending  Lupus Recent dx per patient, prednisone listed on med list but he states he is not taking.  - Will not order  at this time   HLD - Continued home atorvastatin  FEN/GI: Heart Healthy diet; PPI BID Prophylaxis: SCDs given supratherapeutic INR  Disposition: Continue current management; pending further work-up.  Subjective:  Patient doing  well this morning. He has no concerns. There were no overnight events. Patient denies any bleeding. Spoke with patient about the possibility of HD and he seemed hesitant but willing to do it.   Objective: Temp:  [97.4 F (36.3 C)-98.3 F (36.8 C)] 98.2 F (36.8 C) (03/22 0546) Pulse Rate:  [70-117] 80 (03/22 0546) Resp:  [14-17] 15 (03/22 0546) BP: (127-147)/(93-96) 146/93 mmHg (03/22 0546) SpO2:  [86 %-100 %] 100 % (03/22 0546) Weight:  [173 lb 9.6 oz (78.744 kg)-178 lb 1.6 oz (80.786 kg)] 173 lb 9.6 oz (78.744 kg) (03/21 1700) Physical Exam: General: NAD HEENT: AT/[place], sclera clear, EOMI, o/p clear Cardiovascular: RRR, no m/r/g Respiratory: CTAB except decreased aeration in bases bilaterally, normal effort Abdomen: Soft, nontender, no obvious organomegaly but limited by distension, trace pitting edema abdomen Extremities: 3+ LE edema to thighs, pitting; no asymmetry or calf tenderness Skin: No rash or cyanosis, no bruising Neuro: Awake, alert, grossly nonfocal, moves all extremities normally   Intake/Output Summary (Last 24 hours) at 06/23/14 1310 Last data filed at 06/23/14 1025  Gross per 24 hour  Intake    520 ml  Output    150 ml  Net    370 ml   Laboratory: Results for orders placed or performed during the hospital encounter of 06/22/14 (from the past 24 hour(s))  Brain natriuretic peptide     Status: Abnormal   Collection Time: 06/22/14  5:00 PM  Result Value Ref Range   B Natriuretic Peptide 197.1 (H) 0.0 - 100.0 pg/mL  CBC WITH DIFFERENTIAL     Status: Abnormal   Collection Time: 06/22/14  5:00 PM  Result Value Ref Range   WBC 3.2 (L) 4.0 - 10.5 K/uL   RBC 6.06 (H) 4.22 - 5.81 MIL/uL   Hemoglobin 18.2 (H) 13.0 - 17.0 g/dL   HCT 54.8 (H) 39.0 - 52.0 %   MCV 90.4 78.0 - 100.0 fL   MCH 30.0 26.0 - 34.0 pg   MCHC 33.2 30.0 - 36.0 g/dL   RDW 14.7 11.5 - 15.5 %   Platelets 142 (L) 150 - 400 K/uL   Neutrophils Relative % 58 43 - 77 %   Neutro Abs 1.9 1.7 - 7.7 K/uL    Lymphocytes Relative 38 12 - 46 %   Lymphs Abs 1.2 0.7 - 4.0 K/uL   Monocytes Relative 4 3 - 12 %   Monocytes Absolute 0.1 0.1 - 1.0 K/uL   Eosinophils Relative 0 0 - 5 %   Eosinophils Absolute 0.0 0.0 - 0.7 K/uL   Basophils Relative 0 0 - 1 %   Basophils Absolute 0.0 0.0 - 0.1 K/uL  Comprehensive metabolic panel     Status: Abnormal   Collection Time: 06/22/14  5:00 PM  Result Value Ref Range   Sodium 143 135 - 145 mmol/L   Potassium 5.3 (H) 3.5 - 5.1 mmol/L   Chloride 113 (H) 96 - 112 mmol/L   CO2 20 19 - 32 mmol/L   Glucose, Bld 88 70 - 99 mg/dL   BUN 64 (H) 6 - 23 mg/dL   Creatinine, Ser 9.56 (H) 0.50 - 1.35 mg/dL   Calcium 7.8 (L) 8.4 - 10.5 mg/dL   Total Protein 6.3 6.0 - 8.3 g/dL   Albumin 1.1 (L) 3.5 -  5.2 g/dL   AST 16 0 - 37 U/L   ALT 11 0 - 53 U/L   Alkaline Phosphatase 63 39 - 117 U/L   Total Bilirubin 0.3 0.3 - 1.2 mg/dL   GFR calc non Af Amer 5 (L) >90 mL/min   GFR calc Af Amer 6 (L) >90 mL/min   Anion gap 10 5 - 15  Basic metabolic panel     Status: Abnormal   Collection Time: 06/22/14  8:06 PM  Result Value Ref Range   Sodium 140 135 - 145 mmol/L   Potassium 5.5 (H) 3.5 - 5.1 mmol/L   Chloride 113 (H) 96 - 112 mmol/L   CO2 17 (L) 19 - 32 mmol/L   Glucose, Bld 107 (H) 70 - 99 mg/dL   BUN 65 (H) 6 - 23 mg/dL   Creatinine, Ser 9.12 (H) 0.50 - 1.35 mg/dL   Calcium 7.8 (L) 8.4 - 10.5 mg/dL   GFR calc non Af Amer 6 (L) >90 mL/min   GFR calc Af Amer 7 (L) >90 mL/min   Anion gap 10 5 - 15  CBC     Status: Abnormal   Collection Time: 06/22/14  8:06 PM  Result Value Ref Range   WBC 5.6 4.0 - 10.5 K/uL   RBC 3.26 (L) 4.22 - 5.81 MIL/uL   Hemoglobin 10.0 (L) 13.0 - 17.0 g/dL   HCT 30.3 (L) 39.0 - 52.0 %   MCV 92.9 78.0 - 100.0 fL   MCH 30.7 26.0 - 34.0 pg   MCHC 33.0 30.0 - 36.0 g/dL   RDW 14.8 11.5 - 15.5 %   Platelets 258 150 - 400 K/uL  Urinalysis, Routine w reflex microscopic     Status: Abnormal   Collection Time: 06/22/14 11:08 PM  Result Value Ref  Range   Color, Urine YELLOW YELLOW   APPearance CLOUDY (A) CLEAR   Specific Gravity, Urine 1.020 1.005 - 1.030   pH 6.5 5.0 - 8.0   Glucose, UA NEGATIVE NEGATIVE mg/dL   Hgb urine dipstick LARGE (A) NEGATIVE   Bilirubin Urine NEGATIVE NEGATIVE   Ketones, ur NEGATIVE NEGATIVE mg/dL   Protein, ur >300 (A) NEGATIVE mg/dL   Urobilinogen, UA 0.2 0.0 - 1.0 mg/dL   Nitrite NEGATIVE NEGATIVE   Leukocytes, UA NEGATIVE NEGATIVE  Urine microscopic-add on     Status: None   Collection Time: 06/22/14 11:08 PM  Result Value Ref Range   Squamous Epithelial / LPF RARE RARE   WBC, UA 3-6 <3 WBC/hpf   RBC / HPF 21-50 <3 RBC/hpf   Bacteria, UA RARE RARE   Urine-Other AMORPHOUS URATES/PHOSPHATES   Basic metabolic panel     Status: Abnormal   Collection Time: 06/23/14  6:45 AM  Result Value Ref Range   Sodium 141 135 - 145 mmol/L   Potassium 5.4 (H) 3.5 - 5.1 mmol/L   Chloride 112 96 - 112 mmol/L   CO2 20 19 - 32 mmol/L   Glucose, Bld 93 70 - 99 mg/dL   BUN 62 (H) 6 - 23 mg/dL   Creatinine, Ser 9.13 (H) 0.50 - 1.35 mg/dL   Calcium 7.7 (L) 8.4 - 10.5 mg/dL   GFR calc non Af Amer 6 (L) >90 mL/min   GFR calc Af Amer 7 (L) >90 mL/min   Anion gap 9 5 - 15  CBC     Status: Abnormal   Collection Time: 06/23/14  6:45 AM  Result Value Ref Range   WBC 7.0 4.0 -  10.5 K/uL   RBC 2.98 (L) 4.22 - 5.81 MIL/uL   Hemoglobin 8.7 (L) 13.0 - 17.0 g/dL   HCT 27.2 (L) 39.0 - 52.0 %   MCV 91.3 78.0 - 100.0 fL   MCH 29.2 26.0 - 34.0 pg   MCHC 32.0 30.0 - 36.0 g/dL   RDW 14.8 11.5 - 15.5 %   Platelets 289 150 - 400 K/uL  Protime-INR     Status: Abnormal   Collection Time: 06/23/14  6:45 AM  Result Value Ref Range   Prothrombin Time 63.5 (H) 11.6 - 15.2 seconds   INR 7.40 (HH) 0.00 - 1.49    Imaging/Diagnostic Tests: Ct Abdomen Pelvis Wo Contrast  06/22/2014   CLINICAL DATA:  Abdominal distension. Cough. On Coumadin for DVT. Abdominal discomfort and bloating for several weeks. HIV. Chronic kidney disease.  Lupus. History of blood transfusion and gastric ulcers.  EXAM: CT CHEST, ABDOMEN AND PELVIS WITHOUT CONTRAST  TECHNIQUE: Multidetector CT imaging of the chest, abdomen and pelvis was performed following the standard protocol without IV contrast.  COMPARISON:  Abdominal pelvic CT of 03/16/2014. Chest radiograph 02/09/2014. Chest CT of 01/18/2009.  FINDINGS: Exam is moderately degraded secondary to lack of IV contrast, paucity of oral contrast, paucity of fat, diffuse anasarca.  CT CHEST FINDINGS  Mediastinum/Nodes: No axillary adenopathy. Upper normal heart size. Anemia, as evidenced by decreased density in the intravascular space. Small pericardial effusion is new. No gross mediastinal adenopathy. Hilar regions poorly evaluated without intravenous contrast. Surgical changes at the gastroesophageal junction. Moderate esophageal dilatation throughout. Fluid/debris/air level within. This is grossly similar to the remote prior exam.  Lungs/Pleura: Small bilateral pleural effusions. Bibasilar subsegmental atelectasis. Minimal lingular nodularity on image 36 is unchanged since 2010, benign.  Musculoskeletal: No acute osseous abnormality.  CT ABDOMEN PELVIS FINDINGS  Hepatobiliary: Apparent irregular hepatic capsule could be technique related. Multiple gallstones without gallbladder wall thickening or biliary ductal dilatation.  Pancreas: Grossly normal pancreas.  Spleen: Normal  Adrenals/Urinary Tract: Right nephrectomy. No left adrenal mass. No left-sided hydronephrosis or hydroureter. Normal urinary bladder.  Stomach/Bowel: Surgical changes at the gastroesophageal junction. Otherwise, grossly normal appearance of the stomach. Normal caliber of large and small bowel loops.  Vascular/Lymphatic: Normal caliber of the aorta and branch vessels. Limited evaluation for retroperitoneal adenopathy. No pelvic sidewall adenopathy.  Reproductive: Grossly normal prostate.  Other: Since 03/16/2014, development of moderate volume  abdominal pelvic ascites. Diffuse anasarca.  Musculoskeletal: Degenerative partial fusion of the bilateral sacroiliac joints. Scattered pelvic bone islands. S-shaped thoracolumbar spine curvature.  IMPRESSION: CT CHEST IMPRESSION  1. Moderate degradation, secondary to lack of contrast, paucity of fat, and extent of anasarca. 2. Small bilateral pleural effusions with adjacent subsegmental atelectasis. 3. New pericardial effusion, small. 4. Chronic esophageal dilatation with fluid and debris within. This suggests a component of obstruction at the gastroesophageal junction and/or esophageal dysmotility.  CT ABDOMEN AND PELVIS IMPRESSION  1. Development of moderate abdominal pelvic ascites. 2. Apparent irregular hepatic capsule which could be CT artifactual. Interval development of cirrhosis since 03/16/2014 is felt unlikely. Correlate with risk factors. 3. Cholelithiasis. 4. Anasarca.   Electronically Signed   By: Abigail Miyamoto M.D.   On: 06/22/2014 19:07   Ct Chest Wo Contrast  06/22/2014   CLINICAL DATA:  Abdominal distension. Cough. On Coumadin for DVT. Abdominal discomfort and bloating for several weeks. HIV. Chronic kidney disease. Lupus. History of blood transfusion and gastric ulcers.  EXAM: CT CHEST, ABDOMEN AND PELVIS WITHOUT CONTRAST  TECHNIQUE: Multidetector CT imaging  of the chest, abdomen and pelvis was performed following the standard protocol without IV contrast.  COMPARISON:  Abdominal pelvic CT of 03/16/2014. Chest radiograph 02/09/2014. Chest CT of 01/18/2009.  FINDINGS: Exam is moderately degraded secondary to lack of IV contrast, paucity of oral contrast, paucity of fat, diffuse anasarca.  CT CHEST FINDINGS  Mediastinum/Nodes: No axillary adenopathy. Upper normal heart size. Anemia, as evidenced by decreased density in the intravascular space. Small pericardial effusion is new. No gross mediastinal adenopathy. Hilar regions poorly evaluated without intravenous contrast. Surgical changes at the  gastroesophageal junction. Moderate esophageal dilatation throughout. Fluid/debris/air level within. This is grossly similar to the remote prior exam.  Lungs/Pleura: Small bilateral pleural effusions. Bibasilar subsegmental atelectasis. Minimal lingular nodularity on image 36 is unchanged since 2010, benign.  Musculoskeletal: No acute osseous abnormality.  CT ABDOMEN PELVIS FINDINGS  Hepatobiliary: Apparent irregular hepatic capsule could be technique related. Multiple gallstones without gallbladder wall thickening or biliary ductal dilatation.  Pancreas: Grossly normal pancreas.  Spleen: Normal  Adrenals/Urinary Tract: Right nephrectomy. No left adrenal mass. No left-sided hydronephrosis or hydroureter. Normal urinary bladder.  Stomach/Bowel: Surgical changes at the gastroesophageal junction. Otherwise, grossly normal appearance of the stomach. Normal caliber of large and small bowel loops.  Vascular/Lymphatic: Normal caliber of the aorta and branch vessels. Limited evaluation for retroperitoneal adenopathy. No pelvic sidewall adenopathy.  Reproductive: Grossly normal prostate.  Other: Since 03/16/2014, development of moderate volume abdominal pelvic ascites. Diffuse anasarca.  Musculoskeletal: Degenerative partial fusion of the bilateral sacroiliac joints. Scattered pelvic bone islands. S-shaped thoracolumbar spine curvature.  IMPRESSION: CT CHEST IMPRESSION  1. Moderate degradation, secondary to lack of contrast, paucity of fat, and extent of anasarca. 2. Small bilateral pleural effusions with adjacent subsegmental atelectasis. 3. New pericardial effusion, small. 4. Chronic esophageal dilatation with fluid and debris within. This suggests a component of obstruction at the gastroesophageal junction and/or esophageal dysmotility.  CT ABDOMEN AND PELVIS IMPRESSION  1. Development of moderate abdominal pelvic ascites. 2. Apparent irregular hepatic capsule which could be CT artifactual. Interval development of  cirrhosis since 03/16/2014 is felt unlikely. Correlate with risk factors. 3. Cholelithiasis. 4. Anasarca.   Electronically Signed   By: Abigail Miyamoto M.D.   On: 06/22/2014 19:07    Katheren Shams, DO 06/23/2014, 9:11 AM PGY-1, [place] Intern pager: [phone], text pages welcome

## 2014-06-23 NOTE — Progress Notes (Signed)
Patient ID: Kenneth [name], male   DOB: 04-12-1955, 59 y.o.   MRN: [id number]         [facility]ious [facility]    Date of Admission:  06/22/2014     Principal Problem:   Supratherapeutic INR Active Problems:   HIV disease   Cough   Anemia   Essential hypertension   Chronic kidney disease (CKD), stage II (mild)   Protein-calorie malnutrition, severe   Lupus nephritis   Anasarca   Abdominal distension   Essential hypertension, benign   . abacavir  600 mg Oral Daily  . amLODipine  5 mg Oral Daily  . atorvastatin  40 mg Oral q1800  . dolutegravir  50 mg Oral Daily  . fluconazole  100 mg Oral Weekly  . lamiVUDine  100 mg Oral Daily  . pantoprazole  40 mg Oral BID  . sodium chloride  3 mL Intravenous Q12H  . Warfarin - Pharmacist Dosing Inpatient   Does not apply q1800    Subjective: He denies any complaints today. He's not having any difficulty swallowing.  Review of Systems: Review of systems not obtained due to patient factors.  Past Medical History  Diagnosis Date  . HIV infection dx ~ 2009    a. 02/13/2014 CD4 = 240 - undetectable viral load.  . Hyperlipidemia   . Hypertension   . Insomnia   . Dysphagia 2008    post heller myotomy/toupee fundoplication.    . Achalasia   . Renal abscess   . Hemorrhoids, internal   . Premature ventricular contractions   . Pancytopenia   . CKD (chronic kidney disease), stage II   . Lupus nephritis   . SLE (systemic lupus erythematosus) dx'd 2015  . Candida infection, esophageal 08/2010    a. 2012 - noted on EGD.  Marland Kitchen DVT (deep venous thrombosis) ~ 04/2014  . History of blood transfusion 03/2014    "low counts"  . History of stomach ulcers     History  Substance Use Topics  . Smoking status: Never Smoker   . Smokeless tobacco: Never Used  . Alcohol Use: No    Family History  Problem Relation Age of Onset  . Colon cancer Father     deceased  . Other Mother     s/p pacemaker - alive and well.  . Other       5 brothers, 3 sisters - alive and well.   Allergies  Allergen Reactions  . Amoxicillin     REACTION: diffuse rash    OBJECTIVE: Blood pressure 126/93, pulse 94, temperature 98.5 F (36.9 C), temperature source Oral, resp. rate 16, height 6\' 1"  (1.854 m), weight 173 lb 9.6 oz (78.744 kg), SpO2 98 %. General: he is alert and in no distress Skin: no rash Oral: No oropharyngeal lesions Lungs: clear Cor: regular S1 and S2 with no murmur Abdomen: soft and nontender Extremities: No change in pitting edema in the lower legs Neuro: Seems confused. He has some vague and I answers to questions.  Lab Results Lab Results  Component Value Date   WBC 7.0 06/23/2014   HGB 8.7* 06/23/2014   HCT 27.2* 06/23/2014   MCV 91.3 06/23/2014   PLT 289 06/23/2014    Lab Results  Component Value Date   CREATININE 9.13* 06/23/2014   BUN 62* 06/23/2014   NA 141 06/23/2014   K 5.4* 06/23/2014   CL 112 06/23/2014   CO2 20 06/23/2014    Lab Results  Component Value  Date   ALT 11 06/22/2014   AST 16 06/22/2014   ALKPHOS 63 06/22/2014   BILITOT 0.3 06/22/2014    HIV 1 RNA QUANT (copies/mL)  Date Value  03/17/2014 109*  02/13/2014 <20  11/11/2013 <20   CD4 T CELL ABS (/uL)  Date Value  06/22/2014 370*  03/17/2014 100*  02/13/2014 240*   Microbiology: No results found for this or any previous visit (from the past 240 hour(s)).  CT CHEST, ABDOMEN AND PELVIS WITHOUT CONTRAST 06/22/2014  IMPRESSION: CT CHEST IMPRESSION  1. Moderate degradation, secondary to lack of contrast, paucity of fat, and extent of anasarca. 2. Small bilateral pleural effusions with adjacent subsegmental atelectasis. 3. New pericardial effusion, small. 4. Chronic esophageal dilatation with fluid and debris within. This suggests a component of obstruction at the gastroesophageal junction and/or esophageal dysmotility.  CT ABDOMEN AND PELVIS IMPRESSION  1. Development of moderate abdominal pelvic  ascites. 2. Apparent irregular hepatic capsule which could be CT artifactual. Interval development of cirrhosis since 03/16/2014 is felt unlikely. Correlate with risk factors. 3. Cholelithiasis. 4. Anasarca.   Electronically Signed  By: Abigail Miyamoto M.D.  On: 06/22/2014 19:07  Assessment: His CD4 count is currently just below normal which makes HIV related opportunistic infection exceedingly unlikely. His viral load is pending. I will continue his current antiretroviral regimen. I wonder if he has developed uremia and confusion related to his progressive renal dysfunction.  Plan: 1. Continue current antiretroviral therapy 2. Discontinue fluconazole 3. Await results of viral load  Michel Bickers, MD [facility] [place] [phone] pager   [phone] cell 06/23/2014, 6:27 PM

## 2014-06-23 NOTE — Progress Notes (Signed)
CRITICAL VALUE ALERT  Critical value received:  Pt=63.5; INR=7.40  Date of notification:  06/23/2014  Time of notification:  08:53  Critical value read back:Yes.    Nurse who received alert:  Alphonsus Sias  MD notified (1st page):  Family Medicine  Time of first page:  08:54  MD notified (2nd page):  Time of second page:  Responding MD:  MD aware   Time MD responded:

## 2014-06-23 NOTE — Progress Notes (Signed)
ANTICOAGULATION CONSULT NOTE - Follow-up  Pharmacy Consult for warfarin Indication: DVT  Allergies  Allergen Reactions  . Amoxicillin     REACTION: diffuse rash    Patient Measurements: Height: 6\' 1"  (185.4 cm) Weight: 173 lb 9.6 oz (78.744 kg) IBW/kg (Calculated) : 79.9  Vital Signs: Temp: 98.2 F (36.8 C) (03/22 0546) Temp Source: Oral (03/22 0546) BP: 146/93 mmHg (03/22 0546) Pulse Rate: 80 (03/22 0546)  Labs:  Recent Labs  06/22/14 1110 06/22/14 1408  06/22/14 1700 06/22/14 2006 06/23/14 0645  HGB  --   --   < > 18.2* 10.0* 8.7*  HCT  --   --   --  54.8* 30.3* 27.2*  PLT  --   --   --  142* 258 289  LABPROT 56.4*  --   --   --   --  63.5*  INR 6.35* >8.0  --   --   --  7.40*  CREATININE  --   --   --  9.56* 9.12* 9.13*  < > = values in this interval not displayed.  Estimated Creatinine Clearance: 10.1 mL/min (by C-G formula based on Cr of 9.13).  Assessment: 3 yom presented to the hospital after he was found to have a supratherapeutic INR. Today INR has increased further to 7.4 No bleeding noted. H/H has dropped significantly since admission. Plts are WNL  Goal of Therapy:  INR 2-3   Plan:  - No coumadin tonight - Daily INR  Rakan Soffer, Rande Lawman 06/23/2014,9:03 AM

## 2014-06-23 NOTE — Progress Notes (Signed)
Back to room from renal US

## 2014-06-24 DIAGNOSIS — R791 Abnormal coagulation profile: Secondary | ICD-10-CM | POA: Diagnosis not present

## 2014-06-24 DIAGNOSIS — I313 Pericardial effusion (noninflammatory): Secondary | ICD-10-CM

## 2014-06-24 DIAGNOSIS — Z01810 Encounter for preprocedural cardiovascular examination: Secondary | ICD-10-CM

## 2014-06-24 LAB — BASIC METABOLIC PANEL
ANION GAP: 4 — AB (ref 5–15)
BUN: 63 mg/dL — ABNORMAL HIGH (ref 6–23)
CALCIUM: 7.7 mg/dL — AB (ref 8.4–10.5)
CO2: 22 mmol/L (ref 19–32)
Chloride: 115 mmol/L — ABNORMAL HIGH (ref 96–112)
Creatinine, Ser: 9.16 mg/dL — ABNORMAL HIGH (ref 0.50–1.35)
GFR calc Af Amer: 7 mL/min — ABNORMAL LOW (ref >90)
GFR, EST NON AFRICAN AMERICAN: 6 mL/min — AB (ref >90)
Glucose, Bld: 85 mg/dL (ref 70–99)
Potassium: 4.9 mmol/L (ref 3.5–5.1)
SODIUM: 141 mmol/L (ref 135–145)

## 2014-06-24 LAB — CBC
HEMATOCRIT: 27.5 % — AB (ref 39.0–52.0)
Hemoglobin: 9 g/dL — ABNORMAL LOW (ref 13.0–17.0)
MCH: 29.5 pg (ref 26.0–34.0)
MCHC: 32.7 g/dL (ref 30.0–36.0)
MCV: 90.2 fL (ref 78.0–100.0)
PLATELETS: 268 10*3/uL (ref 150–400)
RBC: 3.05 MIL/uL — ABNORMAL LOW (ref 4.22–5.81)
RDW: 14.8 % (ref 11.5–15.5)
WBC: 6.4 10*3/uL (ref 4.0–10.5)

## 2014-06-24 LAB — HIV-1 RNA ULTRAQUANT REFLEX TO GENTYP+
HIV-1 RNA BY PCR: 30 copies/mL
HIV-1 RNA QUANT, LOG: 1.477 {Log_copies}/mL

## 2014-06-24 LAB — PROTIME-INR
INR: 6.19 — AB (ref 0.00–1.49)
Prothrombin Time: 55.3 seconds — ABNORMAL HIGH (ref 11.6–15.2)

## 2014-06-24 LAB — PARATHYROID HORMONE, INTACT (NO CA): PTH: 94 pg/mL — ABNORMAL HIGH (ref 15–65)

## 2014-06-24 MED ORDER — LAMIVUDINE 10 MG/ML PO SOLN
50.0000 mg | Freq: Every day | ORAL | Status: DC
  Administered 2014-06-25 – 2014-07-07 (×13): 50 mg via ORAL
  Filled 2014-06-24 (×13): qty 5

## 2014-06-24 MED ORDER — DARBEPOETIN ALFA 25 MCG/0.42ML IJ SOSY
25.0000 ug | PREFILLED_SYRINGE | Freq: Once | INTRAMUSCULAR | Status: AC
  Administered 2014-06-24: 25 ug via SUBCUTANEOUS
  Filled 2014-06-24: qty 0.42

## 2014-06-24 MED ORDER — DARBEPOETIN ALFA 25 MCG/0.42ML IJ SOSY: 25.0000 ug | PREFILLED_SYRINGE | Freq: Once | INTRAMUSCULAR | Status: DC

## 2014-06-24 NOTE — Consult Note (Signed)
Referring Provider: Dr. Mallie Mussel [facility]) Primary Care Physician:  Cordelia Poche, MD Primary Gastroenterologist:  Dr. Laurence Spates  Reason for Consultation:  Anemia. Dysphagia.  HPI: Kenneth [name] is a 59 y.o. male who underwent a negative screening colonoscopy by Dr. [name] approximately 8 years ago, by his report. He was admitted from the family practice clinic because of cough and marked elevation of his INR. The patient is HIV positive and not medically very compliant. He is not an ideal historian.  With all that background, it was noted on admission that the patient's hemoglobin was 10.0 and it has subsequently fallen to 9. Iron saturation is normal, MCV is normal at 90, RDW is normal at 14.8, and stool is Hemoccult negative. For comparison, last December, the hemoglobin was 8.4. The patient underwent endoscopy while in [place] last November because of dysphagia symptoms, apparently with esophageal candidiasis being noted. Presumably he had a complete endoscopy without sources of anemia endoscopically evident, although we do not have a copy of those records at this time.  From the dysphagia perspective, the patient gives a long-standing history of dysphagia to both solids and liquids and was told that the "muscle and thus esophagus didn't work right." He underwent empiric dilatations of the esophagus in 2007 and 2008, but as far as I can tell, he has not had esophageal manometry to confirm the diagnosis of achalasia. (It may be possible to obtain some old office records to further confirm whether or not achalasia has been demonstrated in the past.) His CT on this admission showed esophageal dilatation and debris suggestive of possible achalasia. He is not currently reporting odynophagia. He has had moderate weight loss, perhaps 30 pounds, over the past year. Because his CD4 count is close to normal, the [name] does not feel that opportunistic infection  of the esophagus is likely at this time. The patient does not seem to be that bothered by his dysphagia symptoms, and indicates that they tend to come and go in terms of severity. He admits to a poor appetite but it also sounds as though there is an element of food avoidance behavior because of his trouble swallowing.  Additional medical problems encountered on this admission include severe progression of his chronic renal insufficiency. He was running a creatinine of 2.9 last December, but now his creatinine is 9.1.  In addition, the patient had a supratherapeutic INR of approximately 6 on admission. He is on Coumadin for DVT in the past.  Past Medical History  Diagnosis Date  . HIV infection dx ~ 2009    a. 02/13/2014 CD4 = 240 - undetectable viral load.  . Hyperlipidemia   . Hypertension   . Insomnia   . Dysphagia 2008    post heller myotomy/toupee fundoplication.    . Achalasia   . Renal abscess   . Hemorrhoids, internal   . Premature ventricular contractions   . Pancytopenia   . CKD (chronic kidney disease), stage II   . Lupus nephritis   . SLE (systemic lupus erythematosus) dx'd 2015  . Candida infection, esophageal 08/2010    a. 2012 - noted on EGD.  Marland Kitchen DVT (deep venous thrombosis) ~ 04/2014  . History of blood transfusion 03/2014    "low counts"  . History of stomach ulcers     Past Surgical History  Procedure Laterality Date  . Nephrectomy Right ~ 2011  . Colonoscopy  2008    .  tortuous colon, internal hemorrhoids.  no polyps or diverticulosis.   . Esophagogastroduodenoscopy  08/2010    Dr [name].  08/2010 candida esophagitis, no obvious esophageal stricture. 02/2006 and 05/2006 dilated esophagus, narrowed distal esophagus but no stricture, was empirically Savary dilated  . Heller myotomy  1999    with toupee fundoplication of HH.     Prior to Admission medications   Medication Sig Start Date End Date Taking? Authorizing Provider  abacavir (ZIAGEN) 300 MG tablet Take  2 tablets (600 mg total) by mouth daily. 04/21/14  Yes Patrecia Pour, MD  acetaminophen (TYLENOL) 500 MG tablet Take 500 mg by mouth every 6 (six) hours as needed for mild pain.   Yes Historical Provider, MD  amLODipine (NORVASC) 10 MG tablet Take 1 tablet (10 mg total) by mouth daily. 01/20/14  Yes Mariel Aloe, MD  atorvastatin (LIPITOR) 40 MG tablet Take 1 tablet (40 mg total) by mouth daily. 04/20/14  Yes Mariel Aloe, MD  cyclobenzaprine (FLEXERIL) 5 MG tablet Take 5 mg by mouth 3 (three) times daily as needed for muscle spasms.   Yes Historical Provider, MD  dolutegravir (TIVICAY) 50 MG tablet Take 1 tablet (50 mg total) by mouth 2 (two) times daily. 04/20/14  Yes Mariel Aloe, MD  lamiVUDine (EPIVIR) 10 MG/ML solution Take 10 mLs (100 mg total) by mouth daily. 03/20/14  Yes Ashly Windell Moulding, DO  warfarin (COUMADIN) 5 MG tablet Take 2.5-5 mg by mouth See admin instructions. Take 5mg  tablet on Sunday, Tuesday, Thursday,and Saturday. Take 2.5mg  all other days   Yes Historical Provider, MD  omeprazole (PRILOSEC) 20 MG capsule Take 1 capsule (20 mg total) by mouth daily. Patient not taking: Reported on 06/22/2014 03/05/14   Mariel Aloe, MD  predniSONE (DELTASONE) 20 MG tablet Take 3 tablets (60 mg total) by mouth daily with breakfast. Patient not taking: Reported on 06/22/2014 02/18/14   [place] N Rumley, DO  sodium bicarbonate 650 MG tablet Take 2 tablets (1,300 mg total) by mouth 2 (two) times daily. Patient not taking: Reported on 06/22/2014 03/20/14   Janora Norlander, DO  sulfamethoxazole-trimethoprim (BACTRIM DS,SEPTRA DS) 800-160 MG per tablet Take 1 tablet by mouth 3 (three) times a week. Patient not taking: Reported on 06/22/2014 03/20/14   Janora Norlander, DO  traMADol (ULTRAM) 50 MG tablet Take 1 tablet (50 mg total) by mouth every 8 (eight) hours as needed. Patient not taking: Reported on 06/22/2014 02/02/14   Nolon Rod, DO    Current Facility-Administered Medications   Medication Dose Route Frequency Provider Last Rate Last Dose  . 0.9 %  sodium chloride infusion  250 mL Intravenous PRN Hilton Sinclair, MD      . abacavir [facility]) tablet 600 mg  600 mg Oral Daily Michel Bickers, MD   600 mg at 06/23/14 1022  . amLODipine (NORVASC) tablet 5 mg  5 mg Oral Daily Hilton Sinclair, MD   5 mg at 06/23/14 1022  . atorvastatin (LIPITOR) tablet 40 mg  40 mg Oral q1800 Hilton Sinclair, MD   40 mg at 06/23/14 1801  . dolutegravir (TIVICAY) tablet 50 mg  50 mg Oral Daily Michel Bickers, MD   50 mg at 06/23/14 1022  . lamiVUDine (EPIVIR) 10 MG/ML solution 100 mg  100 mg Oral Daily Michel Bickers, MD   100 mg at 06/23/14 1022  . pantoprazole (PROTONIX) EC tablet 40 mg  40 mg Oral BID Katheren Shams, DO   40 mg at 06/23/14 2113  .  sodium chloride 0.9 % injection 3 mL  3 mL Intravenous Q12H Hilton Sinclair, MD   3 mL at 06/23/14 2114  . sodium chloride 0.9 % injection 3 mL  3 mL Intravenous PRN Hilton Sinclair, MD   3 mL at 06/22/14 2317  . Warfarin - Pharmacist Dosing Inpatient   Does not apply [place], [place] at 06/23/14 1800    Allergies as of 06/22/2014 - Review Complete 06/22/2014  Allergen Reaction Noted  . Amoxicillin  11/30/2008    Family History  Problem Relation Age of Onset  . Colon cancer Father     deceased  . Other Mother     s/p pacemaker - alive and well.  . Other      5 brothers, 3 sisters - alive and well.    History   Social History  . Marital Status: Single    Spouse Name: N/A  . Number of Children: N/A  . Years of Education: N/A   Occupational History  . Not on file.   Social History Main Topics  . Smoking status: Never Smoker   . Smokeless tobacco: Never Used  . Alcohol Use: No  . Drug Use: No  . Sexual Activity:    Partners: Male   Other Topics Concern  . Not on file   Social History Narrative   Lives in [place] by himself. Does not routinely exercise.    Review of Systems:  Negative for shortness of breath, skin lesions, urinary difficulties, joint swelling, but positive for recent pedal edema. No heartburn, no lower GI tract symptoms such as constipation, diarrhea, or rectal bleeding. At this time, no odynophagia.  Physical Exam: Vital signs in last 24 hours: Temp:  [97.6 F (36.4 C)-98.5 F (36.9 C)] 98.3 F (36.8 C) (03/23 0506) Pulse Rate:  [86-94] 94 (03/23 0506) Resp:  [16-18] 18 (03/23 0506) BP: (126-136)/(88-102) 130/88 mmHg (03/23 0507) SpO2:  [98 %-100 %] 98 % (03/23 0506) Last BM Date: 06/23/14 General:   Alert,  Well-developed, well-nourished, pleasant and cooperative in NAD Head:  Normocephalic and atraumatic. Eyes:  Sclera clear, no icterus.   Conjunctiva pale. Mouth:   No ulcerations or lesions.  Oropharynx pink & moist. No thrush. Neck:   No masses or thyromegaly. Lungs:  Clear throughout to auscultation.   No wheezes, crackles, or rhonchi. No evident respiratory distress. Heart:   Regular rate and rhythm; no murmurs, clicks, rubs,  or gallops. Abdomen:  Soft, nontender, nontympanitic, and nondistended. No masses, hepatosplenomegaly or ventral hernias noted. Quiet bowel sounds, no succussion splash, without bruits, guarding, or rebound.   Msk:   Symmetrical without gross deformities. Pulses:  Normal radial pulse noted. Extremities:   Without clubbing, cyanosis, or edema appreciated at this time. Neurologic:  Alert and coherent;  grossly normal neurologically. Skin:  Intact without significant lesions or rashes. Cervical Nodes:  No significant cervical adenopathy. Psych:   Alert and cooperative. The patient is rather quiet and withdrawn, somewhat flat affect.  Intake/Output from previous day: 03/22 0701 - 03/23 0700 In: 360 [P.O.:360] Out: 200 [Urine:200] Intake/Output this shift:    Lab Results:  Recent Labs  06/22/14 2006 06/23/14 0645 06/24/14 0637  WBC 5.6 7.0 6.4  HGB 10.0* 8.7* 9.0*  HCT 30.3* 27.2* 27.5*  PLT 258 289  268   BMET  Recent Labs  06/22/14 2006 06/23/14 0645 06/24/14 0637  NA 140 141 141  K 5.5* 5.4* 4.9  CL 113* 112 115*  CO2 17* 20 22  GLUCOSE 107* 93 85  BUN 65* 62* 63*  CREATININE 9.12* 9.13* 9.16*  CALCIUM 7.8* 7.7* 7.7*   LFT  Recent Labs  06/22/14 1700  PROT 6.3  ALBUMIN 1.1*  AST 16  ALT 11  ALKPHOS 63  BILITOT 0.3   PT/INR  Recent Labs  06/23/14 0645 06/24/14 0637  LABPROT 63.5* 55.3*  INR 7.40* 6.19*    Studies/Results: Ct Abdomen Pelvis Wo Contrast  06/22/2014   CLINICAL DATA:  Abdominal distension. Cough. On Coumadin for DVT. Abdominal discomfort and bloating for several weeks. HIV. Chronic kidney disease. Lupus. History of blood transfusion and gastric ulcers.  EXAM: CT CHEST, ABDOMEN AND PELVIS WITHOUT CONTRAST  TECHNIQUE: Multidetector CT imaging of the chest, abdomen and pelvis was performed following the standard protocol without IV contrast.  COMPARISON:  Abdominal pelvic CT of 03/16/2014. Chest radiograph 02/09/2014. Chest CT of 01/18/2009.  FINDINGS: Exam is moderately degraded secondary to lack of IV contrast, paucity of oral contrast, paucity of fat, diffuse anasarca.  CT CHEST FINDINGS  Mediastinum/Nodes: No axillary adenopathy. Upper normal heart size. Anemia, as evidenced by decreased density in the intravascular space. Small pericardial effusion is new. No gross mediastinal adenopathy. Hilar regions poorly evaluated without intravenous contrast. Surgical changes at the gastroesophageal junction. Moderate esophageal dilatation throughout. Fluid/debris/air level within. This is grossly similar to the remote prior exam.  Lungs/Pleura: Small bilateral pleural effusions. Bibasilar subsegmental atelectasis. Minimal lingular nodularity on image 36 is unchanged since 2010, benign.  Musculoskeletal: No acute osseous abnormality.  CT ABDOMEN PELVIS FINDINGS  Hepatobiliary: Apparent irregular hepatic capsule could be technique related. Multiple gallstones  without gallbladder wall thickening or biliary ductal dilatation.  Pancreas: Grossly normal pancreas.  Spleen: Normal  Adrenals/Urinary Tract: Right nephrectomy. No left adrenal mass. No left-sided hydronephrosis or hydroureter. Normal urinary bladder.  Stomach/Bowel: Surgical changes at the gastroesophageal junction. Otherwise, grossly normal appearance of the stomach. Normal caliber of large and small bowel loops.  Vascular/Lymphatic: Normal caliber of the aorta and branch vessels. Limited evaluation for retroperitoneal adenopathy. No pelvic sidewall adenopathy.  Reproductive: Grossly normal prostate.  Other: Since 03/16/2014, development of moderate volume abdominal pelvic ascites. Diffuse anasarca.  Musculoskeletal: Degenerative partial fusion of the bilateral sacroiliac joints. Scattered pelvic bone islands. S-shaped thoracolumbar spine curvature.  IMPRESSION: CT CHEST IMPRESSION  1. Moderate degradation, secondary to lack of contrast, paucity of fat, and extent of anasarca. 2. Small bilateral pleural effusions with adjacent subsegmental atelectasis. 3. New pericardial effusion, small. 4. Chronic esophageal dilatation with fluid and debris within. This suggests a component of obstruction at the gastroesophageal junction and/or esophageal dysmotility.  CT ABDOMEN AND PELVIS IMPRESSION  1. Development of moderate abdominal pelvic ascites. 2. Apparent irregular hepatic capsule which could be CT artifactual. Interval development of cirrhosis since 03/16/2014 is felt unlikely. Correlate with risk factors. 3. Cholelithiasis. 4. Anasarca.   Electronically Signed   By: Abigail Miyamoto M.D.   On: 06/22/2014 19:07   Ct Chest Wo Contrast  06/22/2014   CLINICAL DATA:  Abdominal distension. Cough. On Coumadin for DVT. Abdominal discomfort and bloating for several weeks. HIV. Chronic kidney disease. Lupus. History of blood transfusion and gastric ulcers.  EXAM: CT CHEST, ABDOMEN AND PELVIS WITHOUT CONTRAST  TECHNIQUE:  Multidetector CT imaging of the chest, abdomen and pelvis was performed following the standard protocol without IV contrast.  COMPARISON:  Abdominal pelvic CT of 03/16/2014. Chest radiograph 02/09/2014. Chest CT of 01/18/2009.  FINDINGS: Exam is moderately degraded secondary to lack  of IV contrast, paucity of oral contrast, paucity of fat, diffuse anasarca.  CT CHEST FINDINGS  Mediastinum/Nodes: No axillary adenopathy. Upper normal heart size. Anemia, as evidenced by decreased density in the intravascular space. Small pericardial effusion is new. No gross mediastinal adenopathy. Hilar regions poorly evaluated without intravenous contrast. Surgical changes at the gastroesophageal junction. Moderate esophageal dilatation throughout. Fluid/debris/air level within. This is grossly similar to the remote prior exam.  Lungs/Pleura: Small bilateral pleural effusions. Bibasilar subsegmental atelectasis. Minimal lingular nodularity on image 36 is unchanged since 2010, benign.  Musculoskeletal: No acute osseous abnormality.  CT ABDOMEN PELVIS FINDINGS  Hepatobiliary: Apparent irregular hepatic capsule could be technique related. Multiple gallstones without gallbladder wall thickening or biliary ductal dilatation.  Pancreas: Grossly normal pancreas.  Spleen: Normal  Adrenals/Urinary Tract: Right nephrectomy. No left adrenal mass. No left-sided hydronephrosis or hydroureter. Normal urinary bladder.  Stomach/Bowel: Surgical changes at the gastroesophageal junction. Otherwise, grossly normal appearance of the stomach. Normal caliber of large and small bowel loops.  Vascular/Lymphatic: Normal caliber of the aorta and branch vessels. Limited evaluation for retroperitoneal adenopathy. No pelvic sidewall adenopathy.  Reproductive: Grossly normal prostate.  Other: Since 03/16/2014, development of moderate volume abdominal pelvic ascites. Diffuse anasarca.  Musculoskeletal: Degenerative partial fusion of the bilateral sacroiliac joints.  Scattered pelvic bone islands. S-shaped thoracolumbar spine curvature.  IMPRESSION: CT CHEST IMPRESSION  1. Moderate degradation, secondary to lack of contrast, paucity of fat, and extent of anasarca. 2. Small bilateral pleural effusions with adjacent subsegmental atelectasis. 3. New pericardial effusion, small. 4. Chronic esophageal dilatation with fluid and debris within. This suggests a component of obstruction at the gastroesophageal junction and/or esophageal dysmotility.  CT ABDOMEN AND PELVIS IMPRESSION  1. Development of moderate abdominal pelvic ascites. 2. Apparent irregular hepatic capsule which could be CT artifactual. Interval development of cirrhosis since 03/16/2014 is felt unlikely. Correlate with risk factors. 3. Cholelithiasis. 4. Anasarca.   Electronically Signed   By: Abigail Miyamoto M.D.   On: 06/22/2014 19:07   US Renal  06/23/2014   CLINICAL DATA:  Acute kidney injury.  EXAM: RENAL/URINARY TRACT ULTRASOUND COMPLETE  COMPARISON:  None.  FINDINGS: Right Kidney:  Surgically absent  Left Kidney:  Length: 14 cm. Rounded kidney with abnormal increased cortical echogenicity, chronic findings. The main renal artery and renal vein are patent by color Doppler imaging. There is no hydronephrosis or mass lesion.  Bladder:  Successfully decompressed by a Foley catheter.  Moderate to large ascites, distributed throughout the abdomen. This finding is known based on recent abdominal CT.  IMPRESSION: 1. Right nephrectomy and chronic left medical renal disease. 2. No hydronephrosis. 3. Known ascites.   Electronically Signed   By: Monte Fantasia M.D.   On: 06/23/2014 20:07    Impression: 1. Anemia, almost certainly anemia of chronic disease. Patient is up-to-date on screening colonoscopy, had a relatively recent upper endoscopy, and is heme-negative.  2. Long-standing dysphagia, apparently related to some variant of esophageal dysmotility, possibly achalasia.  Plan: 1. I do not feel the patient needs GI  workup for his anemia, especially taking into consideration his other, more pressing medical problems. It would be reasonable to continue to monitor Hemoccults as an outpatient, after discharge, and consider GI evaluation at that time if he is Hemoccult positive. 2. I will obtain a barium swallow tomorrow to try to get an updated evaluation of his esophageal motility and anatomy.   LOS: 2 days   Valerya Maxton V  06/24/2014, 9:37 AM

## 2014-06-24 NOTE — Progress Notes (Signed)
  Echocardiogram 2D Echocardiogram has been performed.  Kenneth [name] 06/24/2014, 1:59 PM

## 2014-06-24 NOTE — Progress Notes (Signed)
Family Medicine Teaching Service Daily Progress Note Intern Pager: [phone]  Patient name: Kenneth [name] Medical record number: [id number] Date of birth: February 19, 1956 Age: 59 y.o. Gender: male  Primary Care Provider: Cordelia Poche, MD Consultants: ID Code Status: Full  Pt Overview and Major Events to Date:  3/21: Admitted for lab abnormalities and concerning physical exam  Assessment and Plan: [name] is a 59 y.o. male presenting with cough and abdominal distension in clinic, found to have supratherapeutic INR to ~8 . PMH is significant for abdominal pain, anemia, DVT, CKD II, ED, HTN, hemorrhoids, HIV, HLD, lupus, PVCs, protein calorie malnutrition.  Now with ESRD: Most recent creatinine 2.77 and GFR 28 03/20/14. However on admission Cr 9.56 and BUN 64. Patient is not making much urine. Progression of renal disease. 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  - Renal US shows R. Surgical nephrectomy. L kidney with medical renal disease. - trend BMP  - nephrology consulted; appreciate recs - strict I/Os - discussed with patient need to have a decision about HD tmrw due to the extensive process it takes to set up.   -believe that some component of indecisiveness due to big lifestyle change ahead and suddenty of new diagnosis.  -also believe depression playing a part but patient declines wanting treatment at this time.   -will continue to support patient's decision and supply any resources he may need  -believe at this point patient will agree to HD  Supratherapeutic INR/Recent DVT INR in clinic >8. Value later verified at 6.3. On coumadin for DVT in January in [place], no records for verification of details. Some concern in clinic of poor understanding of meds (taking when told to stop taking) / lost to f/u. Reports no current bleeding. Decreasing appropriately.  - Continue to monitor - Pharmacy consulted for supratherapeutic INR. - LFTs appropriate - if continues to remain  elevated or signs of bleeding consider Vit. K or FFP for reversal - Hold coumadin; consult pharmacy - Hold VTE pharmacologic ppx; SCDs - Up with assistance - trend PT/INR - improving INR today 6.19  Anasarca with associated cough Unclear etiology. No reported dyspnea. Lung exam mild decreased aeration bases. Reports LE edema is somewhat chronic. Echo 03/2014 with nl LV function and grade 1 diastolic dysfunction with trace MR and TR. Broad ddx due to chronicity (concern for malignancy), h/o HIV (opportunistic infection with unclear if taking medications). Noted a few weeks now, no tenderness, moderate tachycardia on admission. Believe patient to be intracellularly deplete but with fluid overload and renal failure do not want to give IVF at this time. - BNP 197 elevated; no prior ones to compare - CT chest with small bilateral effusions and small pericardial effusion - TSH wnl - Echo pending - encourage PO hydration; SLIV - hoping patient gets HD to help with fluid status - PT/OT eval -Albumin 1.1   Anemia Patient reports previously having blood transfusion about a month ago for anemia. He denies bleeding. Says they did not find a source for low Hbg. Denies any recent colonoscopy. Renal insufficiency likely playing a factor and also anemia of chronic disease. FH of colon cancer. Consider malignancy with stool character change. He has daily BM but it is a very small amount and slightly liquid. Hbg remaining stable baseline appears to be 8-10.  - consult GI; appreciate recs  - recommend barium swallow study to look for achalasia tmrw  -CT with chronic esophageal dilation with fluid and debris within; also irregular hepatic capsule -  trend CBC - Hbg 9.0 today - FOBT negative - anemia labs ordered - low iron levels - ESA started - consider transfusing if Hbg <7 and/or patient symtpomatic  HIV Unclear what medications he is taking from home med list as patient is unclear about this in clinic.  Previously seen by Dr [name] (ID). States he was taking his HIV medications. - ID consulted; appreciate recs  -HAART therapy restarted -fluconazole added for recent hospitalization for esophageal candidiasis requiring esophageal dilation -CD4 count 370 so less likely to have HIV related opportunistic infection  - viral load pending  HTN Stable - Continue home norvasc, decr to 5mg  daily due to unsure about his compliance.  Lupus Recent dx per patient, prednisone listed on med list but he states he is not taking.  - Will not order at this time.   -per nephrology not needed  HLD - Continued home atorvastatin  FEN/GI: Heart Healthy diet; PPI BID Prophylaxis: SCDs given supratherapeutic INR  Disposition: Continue current management; pending further work-up.  Subjective:  Patient with no complaints this morning. States he still has not made a decision about HD yet and would like another day. He is worried about how this will affect his job and lifestyle. He had several good questions this morning. Asked patient if he has any support system and patient states he has no family here but he talks to them and they are aware of his health.   Of note patient seems to have a depressive mood and when asked about this patient denies any depression.   Objective: Temp:  [97.6 F (36.4 C)-98.5 F (36.9 C)] 98.3 F (36.8 C) (03/23 0506) Pulse Rate:  [86-94] 94 (03/23 0506) Resp:  [16-18] 18 (03/23 0506) BP: (126-136)/(88-102) 130/88 mmHg (03/23 0507) SpO2:  [98 %-100 %] 98 % (03/23 0506) Physical Exam: General: NAD, somber-appearing, awake HEENT: AT/[place], sclera clear, EOMI, o/p clear Cardiovascular: RRR, no m/r/g Respiratory: CTAB, normal effort Abdomen: Soft, nontender, nondistended, no obvious organomegaly, +BS Extremities: 3+ LE edema to thighs, pitting; no asymmetry or calf tenderness Skin: No rash or cyanosis, no bruising Neuro: Awake, alert, grossly nonfocal, moves all extremities  normally   Intake/Output Summary (Last 24 hours) at 06/24/14 0805 Last data filed at 06/23/14 1802  Gross per 24 hour  Intake    360 ml  Output    200 ml  Net    160 ml   Laboratory: Results for orders placed or performed during the hospital encounter of 06/22/14 (from the past 24 hour(s))  Parathyroid hormone, intact (no Ca)     Status: Abnormal   Collection Time: 06/23/14 12:02 PM  Result Value Ref Range   PTH 94 (H) 15 - 65 pg/mL  Iron and TIBC     Status: Abnormal   Collection Time: 06/23/14 12:02 PM  Result Value Ref Range   Iron 37 (L) 42 - 165 ug/dL   TIBC 91 (L) 215 - 435 ug/dL   Saturation Ratios 41 20 - 55 %   UIBC 54 (L) 125 - 400 ug/dL  TSH     Status: None   Collection Time: 06/23/14  2:27 PM  Result Value Ref Range   TSH 2.944 0.350 - 4.500 uIU/mL  Occult blood card to lab, stool RN will collect     Status: None   Collection Time: 06/23/14  6:41 PM  Result Value Ref Range   Fecal Occult Bld NEGATIVE NEGATIVE  Protime-INR     Status: Abnormal   Collection  Time: 06/24/14  6:37 AM  Result Value Ref Range   Prothrombin Time 55.3 (H) 11.6 - 15.2 seconds   INR 6.19 (HH) 0.00 - 1.49  CBC     Status: Abnormal   Collection Time: 06/24/14  6:37 AM  Result Value Ref Range   WBC 6.4 4.0 - 10.5 K/uL   RBC 3.05 (L) 4.22 - 5.81 MIL/uL   Hemoglobin 9.0 (L) 13.0 - 17.0 g/dL   HCT 27.5 (L) 39.0 - 52.0 %   MCV 90.2 78.0 - 100.0 fL   MCH 29.5 26.0 - 34.0 pg   MCHC 32.7 30.0 - 36.0 g/dL   RDW 14.8 11.5 - 15.5 %   Platelets 268 150 - 400 K/uL    Imaging/Diagnostic Tests: Ct Chest and Abdomen Pelvis Wo Contrast  06/22/2014  IMPRESSION: CT CHEST IMPRESSION  1. Moderate degradation, secondary to lack of contrast, paucity of fat, and extent of anasarca. 2. Small bilateral pleural effusions with adjacent subsegmental atelectasis. 3. New pericardial effusion, small. 4. Chronic esophageal dilatation with fluid and debris within. This suggests a component of obstruction at the  gastroesophageal junction and/or esophageal dysmotility.  CT ABDOMEN AND PELVIS IMPRESSION  1. Development of moderate abdominal pelvic ascites. 2. Apparent irregular hepatic capsule which could be CT artifactual. Interval development of cirrhosis since 03/16/2014 is felt unlikely. Correlate with risk factors. 3. Cholelithiasis. 4. Anasarca.     Katheren Shams, DO 06/24/2014, 8:05 AM PGY-1, [place] Intern pager: [phone], text pages welcome

## 2014-06-24 NOTE — Progress Notes (Signed)
ANTICOAGULATION CONSULT NOTE - Follow-up  Pharmacy Consult for warfarin Indication: DVT  Allergies  Allergen Reactions  . Amoxicillin     REACTION: diffuse rash    Patient Measurements: Height: 6\' 1"  (185.4 cm) Weight: 173 lb 9.6 oz (78.744 kg) IBW/kg (Calculated) : 79.9  Vital Signs: Temp: 98.3 F (36.8 C) (03/23 0506) Temp Source: Oral (03/23 0506) BP: 130/88 mmHg (03/23 0507) Pulse Rate: 94 (03/23 0506)  Labs:  Recent Labs  06/22/14 1110 06/22/14 1408  06/22/14 2006 06/23/14 0645 06/24/14 0637  HGB  --   --   < > 10.0* 8.7* 9.0*  HCT  --   --   < > 30.3* 27.2* 27.5*  PLT  --   --   < > 258 289 268  LABPROT 56.4*  --   --   --  63.5* 55.3*  INR 6.35* >8.0  --   --  7.40* 6.19*  CREATININE  --   --   < > 9.12* 9.13* 9.16*  < > = values in this interval not displayed.  Estimated Creatinine Clearance: 10 mL/min (by C-G formula based on Cr of 9.16).  Assessment: 63 yom presented to the hospital after he was found to have a supratherapeutic INR. Today INR remains elevated. No bleeding noted. H/H low but now stable. Plts are WNL  Goal of Therapy:  INR 2-3   Plan:  - No coumadin tonight - Daily INR  Gerarda Conklin, Rande Lawman 06/24/2014,9:50 AM

## 2014-06-24 NOTE — Progress Notes (Signed)
[facility] ROUNDING NOTE   Subjective:   Interval History:  Patient continues to be ambivalent about starting dialysis  Objective:  Vital signs in last 24 hours:  Temp:  [97.6 F (36.4 C)-98.5 F (36.9 C)] 98.3 F (36.8 C) (03/23 0506) Pulse Rate:  [86-94] 94 (03/23 0506) Resp:  [16-18] 18 (03/23 0506) BP: (126-136)/(88-102) 130/88 mmHg (03/23 0507) SpO2:  [98 %-100 %] 98 % (03/23 0506)  Weight change:  Filed Weights   06/22/14 1528 06/22/14 1700  Weight: 78.744 kg (173 lb 9.6 oz) 78.744 kg (173 lb 9.6 oz)    Intake/Output: I/O last 3 completed shifts: In: 360 [P.O.:360] Out: 350 [Urine:350]   Intake/Output this shift:     CVS- RRR RS- CTA ABD- BS present soft non-distended EXT- no edema   Basic Metabolic Panel:  Recent Labs Lab 06/22/14 1700 06/22/14 2006 06/23/14 0645 06/24/14 0637  NA 143 140 141 141  K 5.3* 5.5* 5.4* 4.9  CL 113* 113* 112 115*  CO2 20 17* 20 22  GLUCOSE 88 107* 93 85  BUN 64* 65* 62* 63*  CREATININE 9.56* 9.12* 9.13* 9.16*  CALCIUM 7.8* 7.8* 7.7* 7.7*    Liver Function Tests:  Recent Labs Lab 06/22/14 1700  AST 16  ALT 11  ALKPHOS 63  BILITOT 0.3  PROT 6.3  ALBUMIN 1.1*   No results for input(s): LIPASE, AMYLASE in the last 168 hours. No results for input(s): AMMONIA in the last 168 hours.  CBC:  Recent Labs Lab 06/22/14 1700 06/22/14 2006 06/23/14 0645 06/24/14 0637  WBC 3.2* 5.6 7.0 6.4  NEUTROABS 1.9  --   --   --   HGB 18.2* 10.0* 8.7* 9.0*  HCT 54.8* 30.3* 27.2* 27.5*  MCV 90.4 92.9 91.3 90.2  PLT 142* 258 289 268    Cardiac Enzymes: No results for input(s): CKTOTAL, CKMB, CKMBINDEX, TROPONINI in the last 168 hours.  BNP: Invalid input(s): POCBNP  CBG: No results for input(s): GLUCAP in the last 168 hours.  Microbiology: Results for orders placed or performed during the hospital encounter of 03/16/14  Clostridium Difficile by PCR     Status: None   Collection Time: 03/16/14  12:02 PM  Result Value Ref Range Status   C difficile by pcr NEGATIVE NEGATIVE Final  Cryptosporidium Smear, Fecal     Status: None   Collection Time: 03/17/14  9:21 PM  Result Value Ref Range Status   Specimen Description STOOL  Final   Special Requests NONE  Final   Cryptosporidium Smear.   Final    NO Cryptosporidium Cyclospora or Isospora seen. Performed at [facility]    Report Status 03/18/2014 FINAL  Final  Ova and parasite examination     Status: None   Collection Time: 03/17/14  9:22 PM  Result Value Ref Range Status   Specimen Description STOOL  Final   Special Requests NONE  Final   Ova and parasites   Final    NO OVA OR PARASITES SEEN Performed at [facility]    Report Status 03/18/2014 FINAL  Final    Coagulation Studies:  Recent Labs  06/22/14 1110 06/22/14 1408 06/23/14 0645 06/24/14 0637  LABPROT 56.4*  --  63.5* 55.3*  INR 6.35* >8.0 7.40* 6.19*    Urinalysis:  Recent Labs  06/22/14 2308  COLORURINE YELLOW  LABSPEC 1.020  PHURINE 6.5  GLUCOSEU NEGATIVE  HGBUR LARGE*  BILIRUBINUR NEGATIVE  KETONESUR NEGATIVE  PROTEINUR >300*  UROBILINOGEN 0.2  NITRITE  NEGATIVE  LEUKOCYTESUR NEGATIVE      Imaging: Ct Abdomen Pelvis Wo Contrast  06/22/2014   CLINICAL DATA:  Abdominal distension. Cough. On Coumadin for DVT. Abdominal discomfort and bloating for several weeks. HIV. Chronic kidney disease. Lupus. History of blood transfusion and gastric ulcers.  EXAM: CT CHEST, ABDOMEN AND PELVIS WITHOUT CONTRAST  TECHNIQUE: Multidetector CT imaging of the chest, abdomen and pelvis was performed following the standard protocol without IV contrast.  COMPARISON:  Abdominal pelvic CT of 03/16/2014. Chest radiograph 02/09/2014. Chest CT of 01/18/2009.  FINDINGS: Exam is moderately degraded secondary to lack of IV contrast, paucity of oral contrast, paucity of fat, diffuse anasarca.  CT CHEST FINDINGS  Mediastinum/Nodes: No axillary adenopathy. Upper  normal heart size. Anemia, as evidenced by decreased density in the intravascular space. Small pericardial effusion is new. No gross mediastinal adenopathy. Hilar regions poorly evaluated without intravenous contrast. Surgical changes at the gastroesophageal junction. Moderate esophageal dilatation throughout. Fluid/debris/air level within. This is grossly similar to the remote prior exam.  Lungs/Pleura: Small bilateral pleural effusions. Bibasilar subsegmental atelectasis. Minimal lingular nodularity on image 36 is unchanged since 2010, benign.  Musculoskeletal: No acute osseous abnormality.  CT ABDOMEN PELVIS FINDINGS  Hepatobiliary: Apparent irregular hepatic capsule could be technique related. Multiple gallstones without gallbladder wall thickening or biliary ductal dilatation.  Pancreas: Grossly normal pancreas.  Spleen: Normal  Adrenals/Urinary Tract: Right nephrectomy. No left adrenal mass. No left-sided hydronephrosis or hydroureter. Normal urinary bladder.  Stomach/Bowel: Surgical changes at the gastroesophageal junction. Otherwise, grossly normal appearance of the stomach. Normal caliber of large and small bowel loops.  Vascular/Lymphatic: Normal caliber of the aorta and branch vessels. Limited evaluation for retroperitoneal adenopathy. No pelvic sidewall adenopathy.  Reproductive: Grossly normal prostate.  Other: Since 03/16/2014, development of moderate volume abdominal pelvic ascites. Diffuse anasarca.  Musculoskeletal: Degenerative partial fusion of the bilateral sacroiliac joints. Scattered pelvic bone islands. S-shaped thoracolumbar spine curvature.  IMPRESSION: CT CHEST IMPRESSION  1. Moderate degradation, secondary to lack of contrast, paucity of fat, and extent of anasarca. 2. Small bilateral pleural effusions with adjacent subsegmental atelectasis. 3. New pericardial effusion, small. 4. Chronic esophageal dilatation with fluid and debris within. This suggests a component of obstruction at the  gastroesophageal junction and/or esophageal dysmotility.  CT ABDOMEN AND PELVIS IMPRESSION  1. Development of moderate abdominal pelvic ascites. 2. Apparent irregular hepatic capsule which could be CT artifactual. Interval development of cirrhosis since 03/16/2014 is felt unlikely. Correlate with risk factors. 3. Cholelithiasis. 4. Anasarca.   Electronically Signed   By: Abigail Miyamoto M.D.   On: 06/22/2014 19:07   Ct Chest Wo Contrast  06/22/2014   CLINICAL DATA:  Abdominal distension. Cough. On Coumadin for DVT. Abdominal discomfort and bloating for several weeks. HIV. Chronic kidney disease. Lupus. History of blood transfusion and gastric ulcers.  EXAM: CT CHEST, ABDOMEN AND PELVIS WITHOUT CONTRAST  TECHNIQUE: Multidetector CT imaging of the chest, abdomen and pelvis was performed following the standard protocol without IV contrast.  COMPARISON:  Abdominal pelvic CT of 03/16/2014. Chest radiograph 02/09/2014. Chest CT of 01/18/2009.  FINDINGS: Exam is moderately degraded secondary to lack of IV contrast, paucity of oral contrast, paucity of fat, diffuse anasarca.  CT CHEST FINDINGS  Mediastinum/Nodes: No axillary adenopathy. Upper normal heart size. Anemia, as evidenced by decreased density in the intravascular space. Small pericardial effusion is new. No gross mediastinal adenopathy. Hilar regions poorly evaluated without intravenous contrast. Surgical changes at the gastroesophageal junction. Moderate esophageal dilatation throughout. Fluid/debris/air level within.  This is grossly similar to the remote prior exam.  Lungs/Pleura: Small bilateral pleural effusions. Bibasilar subsegmental atelectasis. Minimal lingular nodularity on image 36 is unchanged since 2010, benign.  Musculoskeletal: No acute osseous abnormality.  CT ABDOMEN PELVIS FINDINGS  Hepatobiliary: Apparent irregular hepatic capsule could be technique related. Multiple gallstones without gallbladder wall thickening or biliary ductal dilatation.   Pancreas: Grossly normal pancreas.  Spleen: Normal  Adrenals/Urinary Tract: Right nephrectomy. No left adrenal mass. No left-sided hydronephrosis or hydroureter. Normal urinary bladder.  Stomach/Bowel: Surgical changes at the gastroesophageal junction. Otherwise, grossly normal appearance of the stomach. Normal caliber of large and small bowel loops.  Vascular/Lymphatic: Normal caliber of the aorta and branch vessels. Limited evaluation for retroperitoneal adenopathy. No pelvic sidewall adenopathy.  Reproductive: Grossly normal prostate.  Other: Since 03/16/2014, development of moderate volume abdominal pelvic ascites. Diffuse anasarca.  Musculoskeletal: Degenerative partial fusion of the bilateral sacroiliac joints. Scattered pelvic bone islands. S-shaped thoracolumbar spine curvature.  IMPRESSION: CT CHEST IMPRESSION  1. Moderate degradation, secondary to lack of contrast, paucity of fat, and extent of anasarca. 2. Small bilateral pleural effusions with adjacent subsegmental atelectasis. 3. New pericardial effusion, small. 4. Chronic esophageal dilatation with fluid and debris within. This suggests a component of obstruction at the gastroesophageal junction and/or esophageal dysmotility.  CT ABDOMEN AND PELVIS IMPRESSION  1. Development of moderate abdominal pelvic ascites. 2. Apparent irregular hepatic capsule which could be CT artifactual. Interval development of cirrhosis since 03/16/2014 is felt unlikely. Correlate with risk factors. 3. Cholelithiasis. 4. Anasarca.   Electronically Signed   By: Abigail Miyamoto M.D.   On: 06/22/2014 19:07   US Renal  06/23/2014   CLINICAL DATA:  Acute kidney injury.  EXAM: RENAL/URINARY TRACT ULTRASOUND COMPLETE  COMPARISON:  None.  FINDINGS: Right Kidney:  Surgically absent  Left Kidney:  Length: 14 cm. Rounded kidney with abnormal increased cortical echogenicity, chronic findings. The main renal artery and renal vein are patent by color Doppler imaging. There is no  hydronephrosis or mass lesion.  Bladder:  Successfully decompressed by a Foley catheter.  Moderate to large ascites, distributed throughout the abdomen. This finding is known based on recent abdominal CT.  IMPRESSION: 1. Right nephrectomy and chronic left medical renal disease. 2. No hydronephrosis. 3. Known ascites.   Electronically Signed   By: Monte Fantasia M.D.   On: 06/23/2014 20:07     Medications:     . abacavir  600 mg Oral Daily  . amLODipine  5 mg Oral Daily  . atorvastatin  40 mg Oral q1800  . dolutegravir  50 mg Oral Daily  . [START ON 06/25/2014] lamiVUDine  50 mg Oral Daily  . pantoprazole  40 mg Oral BID  . sodium chloride  3 mL Intravenous Q12H  . 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 We discussed dialysis and the patient is very resistant to start. He understands that he has now progressed to end stage but is bargaining for more time to make a decision.   Anemia Tsats normal  Start ESA  Bones   PTH 94  HTN controlled at present  LOS: 2 Kenneth [name] W @TODAY @10 :49 AM

## 2014-06-24 NOTE — Progress Notes (Signed)
OT Cancellation Note  Patient Details Name: Kenneth [name] MRN: [id number] DOB: 05-10-1955   Cancelled Treatment:    Reason Eval/Treat Not Completed: Medical issues which prohibited therapy. INR not in therapeutic range. Will try to complete evaluation tomorrow.  Benito Mccreedy OTR/L [id number] 06/24/2014, 1:46 PM

## 2014-06-24 NOTE — Progress Notes (Signed)
VASCULAR LAB PRELIMINARY  PRELIMINARY  PRELIMINARY  PRELIMINARY  ///Rouleaux flow noted throughout deep and superficial veins in the upper extremities.    Right  Upper Extremity Vein Map    Cephalic = NOT VISUALIZED  Segment Diameter Depth Comment  1. Axilla mm mm   2. Mid upper arm mm mm   3. Above AC mm mm   4. In AC mm mm   5. Below AC mm mm   6. Mid forearm mm mm   7. Wrist mm mm    mm mm    mm mm    mm mm    Basilic  Segment Diameter Depth Comment  1. Axilla mm mm   2. Mid upper arm 4.58mm mm   3. Above AC 2.62mm mm   4. In [facility] 2.41mm mm   5. Below AC mm mm   6. Mid forearm mm mm   7. Wrist mm mm    mm mm    mm mm    mm mm     Left Upper Extremity Vein Map    Cephalic  Segment Diameter Depth Comment  1. Axilla 0.7mm mm   2. Mid upper arm 1.26mm mm   3. Above AC 1.77mm mm   4. In AC mm mm   5. Below AC mm mm   6. Mid forearm mm mm   7. Wrist mm mm    mm mm    mm mm    mm mm    Basilic  Segment Diameter Depth Comment  1. Axilla 3.31mm mm   2. Mid upper arm 4.58mm mm   3. Above AC 1.75mm mm   4. In AC 2.84mm mm   5. Below AC mm mm   6. Mid forearm mm mm   7. Wrist mm mm    mm mm    mm mm    mm mm     Landry Mellow, RDMS, RVT  06/24/2014, 3:24 PM

## 2014-06-24 NOTE — Progress Notes (Signed)
CRITICAL VALUE ALERT  Critical value received:  INR=6.19  Date of notification:  06/24/2014  Time of notification:  8:01am  Critical value read back:Yes.    Nurse who received alert:  Marney Doctor RN  MD notified (1st page):  Attending- Nori Riis  Time of first page:  8:02am  MD notified (2nd page):  Time of second page:  Responding MD:  Nori Riis  Time MD responded:  8:04am  No new orders received. INR trending down.

## 2014-06-24 NOTE — Progress Notes (Signed)
Patient ID: Kenneth [name], male   DOB: 02-09-1956, 59 y.o.   MRN: [id number]         [facility]    Date of Admission:  06/22/2014     Principal Problem:   Supratherapeutic INR Active Problems:   HIV disease   Cough   Anemia   Essential hypertension   Chronic kidney disease (CKD), stage II (mild)   Protein-calorie malnutrition, severe   Lupus nephritis   Anasarca   Abdominal distension   Essential hypertension, benign   . abacavir  600 mg Oral Daily  . amLODipine  5 mg Oral Daily  . atorvastatin  40 mg Oral q1800  . dolutegravir  50 mg Oral Daily  . [START ON 06/25/2014] lamiVUDine  50 mg Oral Daily  . pantoprazole  40 mg Oral BID  . sodium chloride  3 mL Intravenous Q12H  . Warfarin - Pharmacist Dosing Inpatient   Does not apply q1800    Subjective: He had some nausea and difficulty swallowing this morning but states that both have resolved now. He is not having any difficulty taking his oral medications.  Review of Systems: Review of systems not obtained due to patient factors.  Past Medical History  Diagnosis Date  . HIV infection dx ~ 2009    a. 02/13/2014 CD4 = 240 - undetectable viral load.  . Hyperlipidemia   . Hypertension   . Insomnia   . Dysphagia 2008    post heller myotomy/toupee fundoplication.    . Achalasia   . Renal abscess   . Hemorrhoids, internal   . Premature ventricular contractions   . Pancytopenia   . CKD (chronic kidney disease), stage II   . Lupus nephritis   . SLE (systemic lupus erythematosus) dx'd 2015  . Candida infection, esophageal 08/2010    a. 2012 - noted on EGD.  Marland Kitchen DVT (deep venous thrombosis) ~ 04/2014  . History of blood transfusion 03/2014    "low counts"  . History of stomach ulcers     History  Substance Use Topics  . Smoking status: Never Smoker   . Smokeless tobacco: Never Used  . Alcohol Use: No    Family History  Problem Relation Age of Onset  . Colon cancer Father    deceased  . Other Mother     s/p pacemaker - alive and well.  . Other      5 brothers, 3 sisters - alive and well.   Allergies  Allergen Reactions  . Amoxicillin     REACTION: diffuse rash    OBJECTIVE: Blood pressure 130/88, pulse 94, temperature 98.3 F (36.8 C), temperature source Oral, resp. rate 18, height 6\' 1"  (1.854 m), weight 173 lb 9.6 oz (78.744 kg), SpO2 98 %. General: he is a little more lethargic and slow to answer questions Oral: No oropharyngeal lesions Lungs: clear Cor: regular S1 and S2 with no murmur Abdomen: soft and nontender Extremities: No change in pitting edema in the lower legs   Lab Results Lab Results  Component Value Date   WBC 6.4 06/24/2014   HGB 9.0* 06/24/2014   HCT 27.5* 06/24/2014   MCV 90.2 06/24/2014   PLT 268 06/24/2014    Lab Results  Component Value Date   CREATININE 9.16* 06/24/2014   BUN 63* 06/24/2014   NA 141 06/24/2014   K 4.9 06/24/2014   CL 115* 06/24/2014   CO2 22 06/24/2014    Lab Results  Component Value Date  ALT 11 06/22/2014   AST 16 06/22/2014   ALKPHOS 63 06/22/2014   BILITOT 0.3 06/22/2014    HIV 1 RNA QUANT (copies/mL)  Date Value  03/17/2014 109*  02/13/2014 <20  11/11/2013 <20   CD4 T CELL ABS (/uL)  Date Value  06/22/2014 370*  03/17/2014 100*  02/13/2014 240*    Assessment: Hopefully he will agree to start dialysis soon. I will continue his current renally adjusted antiretroviral regimen.  Plan: 1. Continue current antiretroviral therapy 2. Await results of viral load  Michel Bickers, MD [facility] for [place] [phone] pager   [phone] cell 06/24/2014, 2:49 PM

## 2014-06-24 NOTE — Progress Notes (Signed)
UR completed 

## 2014-06-25 ENCOUNTER — Observation Stay (HOSPITAL_COMMUNITY): Payer: BC Managed Care – PPO

## 2014-06-25 DIAGNOSIS — R1310 Dysphagia, unspecified: Secondary | ICD-10-CM | POA: Diagnosis present

## 2014-06-25 DIAGNOSIS — N186 End stage renal disease: Secondary | ICD-10-CM | POA: Diagnosis present

## 2014-06-25 DIAGNOSIS — N182 Chronic kidney disease, stage 2 (mild): Secondary | ICD-10-CM

## 2014-06-25 LAB — BASIC METABOLIC PANEL
ANION GAP: 8 (ref 5–15)
BUN: 73 mg/dL — ABNORMAL HIGH (ref 6–23)
CO2: 20 mmol/L (ref 19–32)
Calcium: 7.7 mg/dL — ABNORMAL LOW (ref 8.4–10.5)
Chloride: 112 mmol/L (ref 96–112)
Creatinine, Ser: 9.09 mg/dL — ABNORMAL HIGH (ref 0.50–1.35)
GFR calc Af Amer: 7 mL/min — ABNORMAL LOW (ref >90)
GFR, EST NON AFRICAN AMERICAN: 6 mL/min — AB (ref >90)
Glucose, Bld: 92 mg/dL (ref 70–99)
Potassium: 4.4 mmol/L (ref 3.5–5.1)
Sodium: 140 mmol/L (ref 135–145)

## 2014-06-25 LAB — CBC
HCT: 25.4 % — ABNORMAL LOW (ref 39.0–52.0)
Hemoglobin: 8.3 g/dL — ABNORMAL LOW (ref 13.0–17.0)
MCH: 29.9 pg (ref 26.0–34.0)
MCHC: 32.7 g/dL (ref 30.0–36.0)
MCV: 91.4 fL (ref 78.0–100.0)
PLATELETS: 242 10*3/uL (ref 150–400)
RBC: 2.78 MIL/uL — ABNORMAL LOW (ref 4.22–5.81)
RDW: 14.9 % (ref 11.5–15.5)
WBC: 6.3 10*3/uL (ref 4.0–10.5)

## 2014-06-25 LAB — PROTIME-INR
INR: 4.87 — ABNORMAL HIGH (ref 0.00–1.49)
Prothrombin Time: 45.8 seconds — ABNORMAL HIGH (ref 11.6–15.2)

## 2014-06-25 MED ORDER — PHYTONADIONE 5 MG PO TABS
5.0000 mg | ORAL_TABLET | Freq: Once | ORAL | Status: AC
  Administered 2014-06-25: 5 mg via ORAL
  Filled 2014-06-25: qty 1

## 2014-06-25 NOTE — Consult Note (Signed)
[facility] NOTE   MRN : [id number]  Reason for Consult: ESRD Referring Physician: Dr. Justin Mend  History of Present Illness: 59 y/o male with acute on chronic kidney failure.  W#e are consulted to provide dialysis access.  He was admitted with supra therapeutic INR of 8.  He was placed on coumadin for treatment of DVT.  Past medical history includes HIV, Lupus, Hypertension and hyperlipidemia.  His home medication include a statin, prednisone and coumadin.  I spoke with the internal medicine doctor today and they are working on the INR today it is 4.8.     Current Facility-Administered Medications  Medication Dose Route Frequency Provider Last Rate Last Dose  . 0.9 %  sodium chloride infusion  250 mL Intravenous PRN Hilton Sinclair, MD      . abacavir [facility]) tablet 600 mg  600 mg Oral Daily Michel Bickers, MD   600 mg at 06/25/14 0935  . amLODipine (NORVASC) tablet 5 mg  5 mg Oral Daily Hilton Sinclair, MD   5 mg at 06/25/14 0935  . atorvastatin (LIPITOR) tablet 40 mg  40 mg Oral q1800 Hilton Sinclair, MD   40 mg at 06/24/14 1812  . dolutegravir (TIVICAY) tablet 50 mg  50 mg Oral Daily Michel Bickers, MD   50 mg at 06/25/14 0935  . lamiVUDine (EPIVIR) 10 MG/ML solution 50 mg  50 mg Oral Daily Michel Bickers, MD   50 mg at 06/25/14 0936  . pantoprazole (PROTONIX) EC tablet 40 mg  40 mg Oral BID Katheren Shams, DO   40 mg at 06/25/14 0935  . phytonadione (VITAMIN K) tablet 5 mg  5 mg Oral Once Edrick Oh, MD      . sodium chloride 0.9 % injection 3 mL  3 mL Intravenous Q12H Hilton Sinclair, MD   3 mL at 06/25/14 0940  . sodium chloride 0.9 % injection 3 mL  3 mL Intravenous PRN Hilton Sinclair, MD   3 mL at 06/22/14 2317  . Warfarin - Pharmacist Dosing Inpatient   Does not apply Cecilia, Taylor at 06/23/14 1800    Pt meds include: Statin :Yes Betablocker: No ASA: No Other anticoagulants/antiplatelets:  coumadin on hold  Past Medical History  Diagnosis Date  . HIV infection dx ~ 2009    a. 02/13/2014 CD4 = 240 - undetectable viral load.  . Hyperlipidemia   . Hypertension   . Insomnia   . Dysphagia 2008    post heller myotomy/toupee fundoplication.    . Achalasia   . Renal abscess   . Hemorrhoids, internal   . Premature ventricular contractions   . Pancytopenia   . CKD (chronic kidney disease), stage II   . Lupus nephritis   . SLE (systemic lupus erythematosus) dx'd 2015  . Candida infection, esophageal 08/2010    a. 2012 - noted on EGD.  Marland Kitchen DVT (deep venous thrombosis) ~ 04/2014  . History of blood transfusion 03/2014    "low counts"  . History of stomach ulcers     Past Surgical History  Procedure Laterality Date  . Nephrectomy Right ~ 2011  . Colonoscopy  2008    .  tortuous colon, internal hemorrhoids.  no polyps or diverticulosis.   . Esophagogastroduodenoscopy  08/2010    Dr [name].  08/2010 candida esophagitis, no obvious esophageal stricture. 02/2006 and 05/2006 dilated esophagus, narrowed distal esophagus but no stricture, was empirically Savary dilated  .  Heller myotomy  1999    with toupee fundoplication of HH.     Social History History  Substance Use Topics  . Smoking status: Never Smoker   . Smokeless tobacco: Never Used  . Alcohol Use: No    Family History Family History  Problem Relation Age of Onset  . Colon cancer Father     deceased  . Other Mother     s/p pacemaker - alive and well.  . Other      5 brothers, 3 sisters - alive and well.    Allergies  Allergen Reactions  . Amoxicillin     REACTION: diffuse rash     REVIEW OF SYSTEMS  General: _0  Weight loss, _1  Fever, _2  chills Neurologic: _3  Dizziness, _4  Blackouts, _5  Seizure _6  Stroke, _7  "Mini stroke", _8  Slurred speech, _9  Temporary blindness; _10  weakness in arms or legs, _11  Hoarseness _12  Dysphagia Cardiac: _13  Chest pain/pressure, _14  Shortness of breath at rest _15   Shortness of breath with exertion, _16  Atrial fibrillation or irregular heartbeat  Vascular: _17  Pain in legs with walking, _18  Pain in legs at rest, _19  Pain in legs at night,  _20  Non-healing ulcer, _21  Blood clot in vein/DVT,   Pulmonary: _22  Home oxygen, _23  Productive cough, _24  Coughing up blood, _25  Asthma,  _26  Wheezing _27  COPD Musculoskeletal:  _28  Arthritis, _29  Low back pain, _30  Joint pain Hematologic: [x ] Easy Bruising, [x ] Anemia; _31  Hepatitis Gastrointestinal: _32  Blood in stool, _33  Gastroesophageal Reflux/heartburn, [x[ abdominal distention Urinary: _34  chronic Kidney disease, _35  on HD - _36  MWF or _37  TTHS, _38  Burning with urination, _39  Difficulty urinating Skin: _40  Rashes, _41  Wounds Psychological: _42  Anxiety, _43  Depression  Physical Examination Filed Vitals:   06/24/14 0507 06/24/14 1537 06/24/14 2134 06/25/14 0515  BP: 130/88 142/88 140/90 124/80  Pulse:  79 84   Temp:  98.5 F (36.9 C) 98.1 F (36.7 C) 99.5 F (37.5 C)  TempSrc:  Oral Oral Oral  Resp:  _44 Height:      Weight:      SpO2:  100% 99% 100%   Body mass index is 22.91 kg/(m^2).  General:  WDWN in NAD Gait: Normal HENT: WNL Eyes: Pupils equal Pulmonary: normal non-labored breathing , without Rales, rhonchi,  wheezing Cardiac: RRR, without  Murmurs, rubs or gallops; No carotid bruits Abdomen: soft, NT, distended, no masses Skin: no rashes, ulcers noted;  no Gangrene , no cellulitis; no open wounds;   Vascular Exam/Pulses:palpable radial and brachial pulses bilateral   Musculoskeletal: no muscle wasting or atrophy; no edema  Neurologic: A&O X 3; Appropriate Affect ;  SENSATION: normal; MOTOR FUNCTION: 5/5 Symmetric Speech is fluent/normal   Significant Diagnostic Studies: CBC Lab Results  Component Value Date   WBC 6.3 06/25/2014   HGB 8.3* 06/25/2014   HCT 25.4* 06/25/2014   MCV 91.4 06/25/2014   PLT 242 06/25/2014    BMET    Component Value Date/Time   NA  140 06/25/2014 0745   K 4.4 06/25/2014 0745   CL 112 06/25/2014 0745   CO2 20 06/25/2014 0745   GLUCOSE 92 06/25/2014 0745   BUN 73* 06/25/2014 0745   CREATININE 9.09* 06/25/2014 0745   CREATININE 5.29* 02/10/2014  1030   CREATININE 1.37* 11/11/2013 1108   CALCIUM 7.7* 06/25/2014 0745   GFRNONAA 6* 06/25/2014 0745   GFRNONAA 80 07/10/2013 1529   GFRAA 7* 06/25/2014 0745   GFRAA >89 07/10/2013 1529   Estimated Creatinine Clearance: 10.1 mL/min (by C-G formula based on Cr of 9.09).  COAG Lab Results  Component Value Date   INR 4.87* 06/25/2014   INR 6.19* 06/24/2014   INR 7.40* 06/23/2014     Non-Invasive Vascular Imaging:  Vein mapping adequate  basillic bilaterally.  ASSESSMENT/PLAN:  ESR acute on chronic He is right hand dominant  Plan for Monday creation of AV fistula and diatek catheter placement.  The INR needs to be less than 2.0, we prefer less than 1.5 prior to surgery.     Laurence Slate [facility] 06/25/2014 10:35 AM  I agree with the above.  This is a right-handed gentleman with end-stage renal disease, needing dialysis access.  He is currently supratherapeutic with his Coumadin, which he takes for treatment of a DVT.  I have reviewed his vein mapping.  His cephalic veins were not visualized bilaterally.  Since he is right-handed, I would recommend an attempt at a staged basilic vein procedure, given that his left basilic vein is marginal.  Alternatively, he could have a graft.  We will also place a permcath.  I discussed the risks and benefits with the patient.  I discussed that he may require additional surgeries to get a fistula to mature.  We also discussed the risk of steal syndrome.  He will be placed on the schedule for Monday, assuming his coagulation profile has corrected.   Annamarie Major

## 2014-06-25 NOTE — Progress Notes (Signed)
[facility] ROUNDING NOTE   Subjective:   Interval History: Appears depressed  Although does agree to dialysis   Objective:  Vital signs in last 24 hours:  Temp:  [98.1 F (36.7 C)-99.5 F (37.5 C)] 99.5 F (37.5 C) (03/24 0515) Pulse Rate:  [79-84] 84 (03/23 2134) Resp:  [16] 16 (03/24 0515) BP: (124-142)/(80-90) 124/80 mmHg (03/24 0515) SpO2:  [99 %-100 %] 100 % (03/24 0515)  Weight change:  Filed Weights   06/22/14 1528 06/22/14 1700  Weight: 78.744 kg (173 lb 9.6 oz) 78.744 kg (173 lb 9.6 oz)    Intake/Output: I/O last 3 completed shifts: In: 222 [P.O.:222] Out: 500 [Urine:500]   Intake/Output this shift:  Total I/O In: 342 [P.O.:342] Out: 300 [Urine:300]  CVS- RRR RS- CTA ABD- BS present soft non-distended EXT- no edema   Basic Metabolic Panel:  Recent Labs Lab 06/22/14 1700 06/22/14 2006 06/23/14 0645 06/24/14 0637 06/25/14 0745  NA 143 140 141 141 140  K 5.3* 5.5* 5.4* 4.9 4.4  CL 113* 113* 112 115* 112  CO2 20 17* 20 22 20   GLUCOSE 88 107* 93 85 92  BUN 64* 65* 62* 63* 73*  CREATININE 9.56* 9.12* 9.13* 9.16* 9.09*  CALCIUM 7.8* 7.8* 7.7* 7.7* 7.7*    Liver Function Tests:  Recent Labs Lab 06/22/14 1700  AST 16  ALT 11  ALKPHOS 63  BILITOT 0.3  PROT 6.3  ALBUMIN 1.1*   No results for input(s): LIPASE, AMYLASE in the last 168 hours. No results for input(s): AMMONIA in the last 168 hours.  CBC:  Recent Labs Lab 06/22/14 1700 06/22/14 2006 06/23/14 0645 06/24/14 0637 06/25/14 0745  WBC 3.2* 5.6 7.0 6.4 6.3  NEUTROABS 1.9  --   --   --   --   HGB 18.2* 10.0* 8.7* 9.0* 8.3*  HCT 54.8* 30.3* 27.2* 27.5* 25.4*  MCV 90.4 92.9 91.3 90.2 91.4  PLT 142* 258 289 268 242    Cardiac Enzymes: No results for input(s): CKTOTAL, CKMB, CKMBINDEX, TROPONINI in the last 168 hours.  BNP: Invalid input(s): POCBNP  CBG: No results for input(s): GLUCAP in the last 168 hours.  Microbiology: Results for orders placed or  performed during the hospital encounter of 03/16/14  Clostridium Difficile by PCR     Status: None   Collection Time: 03/16/14 12:02 PM  Result Value Ref Range Status   C difficile by pcr NEGATIVE NEGATIVE Final  Cryptosporidium Smear, Fecal     Status: None   Collection Time: 03/17/14  9:21 PM  Result Value Ref Range Status   Specimen Description STOOL  Final   Special Requests NONE  Final   Cryptosporidium Smear.   Final    NO Cryptosporidium Cyclospora or Isospora seen. Performed at [facility]    Report Status 03/18/2014 FINAL  Final  Ova and parasite examination     Status: None   Collection Time: 03/17/14  9:22 PM  Result Value Ref Range Status   Specimen Description STOOL  Final   Special Requests NONE  Final   Ova and parasites   Final    NO OVA OR PARASITES SEEN Performed at [facility]    Report Status 03/18/2014 FINAL  Final    Coagulation Studies:  Recent Labs  06/22/14 1110 06/22/14 1408 06/23/14 0645 06/24/14 0637 06/25/14 0745  LABPROT 56.4*  --  63.5* 55.3* 45.8*  INR 6.35* >8.0 7.40* 6.19* 4.87*    Urinalysis:  Recent Labs  06/22/14  [facility] 1.020  PHURINE 6.5  GLUCOSEU NEGATIVE  HGBUR LARGE*  BILIRUBINUR NEGATIVE  KETONESUR NEGATIVE  PROTEINUR >300*  UROBILINOGEN 0.2  NITRITE NEGATIVE  LEUKOCYTESUR NEGATIVE      Imaging: Dg Esophagus  06/25/2014   CLINICAL DATA:  Dysphagia.  HIV.  History of esophageal dilatations.  EXAM: ESOPHOGRAM/BARIUM SWALLOW  TECHNIQUE: Combined double contrast and single contrast examination performed using effervescent crystals, thick barium liquid, and thin barium liquid.  FLUOROSCOPY TIME:  2 min 28 seconds. Entrance does 19 mGy. Twenty-nine images acquired, most fluoro grabs  COMPARISON:  Chest CT from 3 days ago.  12/12/2005  FINDINGS: Pharyngeal function is normal. There is mild constriction at the level of the upper cervical esophagus, without flow limiting web or  mass lesion, stable from previous and likely from small osteophytes.  The esophagus is patulous, as noted previously. Most of the previously noted fluid and debris has cleared from the esophagus compared to previous CT. No discrete mucosal mass or ulceration is noted. The esophagus is markedly dilated, and does taper and slowly empty at the GE junction, where there has been anti reflux procedure which results in fairly smooth distortion of the gastroesophageal junction. There is no mucosal mounding or ulceration suggestive of a mass at the gastroesophageal junction. There is esophageal dysmotility, but the esophagus is not completely akinetic, with weak tertiary contractions noted intermittently. No herniation of stomach.  IMPRESSION: 1. Stable exam since 2007. 2. Esophageal dysmotility and dilation, but no akinesia typical of achalasia. Non-fixed narrowing at the GE junction, at least partly related to remote anti reflux surgery.   Electronically Signed   By: Monte Fantasia M.D.   On: 06/25/2014 09:46   US Renal  06/23/2014   CLINICAL DATA:  Acute kidney injury.  EXAM: RENAL/URINARY TRACT ULTRASOUND COMPLETE  COMPARISON:  None.  FINDINGS: Right Kidney:  Surgically absent  Left Kidney:  Length: 14 cm. Rounded kidney with abnormal increased cortical echogenicity, chronic findings. The main renal artery and renal vein are patent by color Doppler imaging. There is no hydronephrosis or mass lesion.  Bladder:  Successfully decompressed by a Foley catheter.  Moderate to large ascites, distributed throughout the abdomen. This finding is known based on recent abdominal CT.  IMPRESSION: 1. Right nephrectomy and chronic left medical renal disease. 2. No hydronephrosis. 3. Known ascites.   Electronically Signed   By: Monte Fantasia M.D.   On: 06/23/2014 20:07     Medications:     . abacavir  600 mg Oral Daily  . amLODipine  5 mg Oral Daily  . atorvastatin  40 mg Oral q1800  . dolutegravir  50 mg Oral Daily  .  lamiVUDine  50 mg Oral Daily  . pantoprazole  40 mg Oral BID  . phytonadione  5 mg Oral Once  . sodium chloride  3 mL Intravenous Q12H  . 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 Pateint has agree to starting dialysis and I have called [facility] to place permcath and fistula  Bones PTH 94  HTN controlled at present  Anemia ESA started  Depression  I discussed with Dr Gwendlyn Deutscher and she will evaluate today    LOS: 3 Jenniger Figiel W @TODAY @10 :24 AM

## 2014-06-25 NOTE — Progress Notes (Signed)
OT Cancellation Note  Patient Details Name: Kenneth [name] MRN: [id number] DOB: 15-Aug-1955   Cancelled Treatment:    Reason Eval/Treat Not Completed: Patient at procedure or test/ unavailable. Pt currently in radiology and INR for today is not in chart yet.  Almon Register [id number] 06/25/2014, 8:07 AM

## 2014-06-25 NOTE — Progress Notes (Signed)
UR completed 

## 2014-06-25 NOTE — Progress Notes (Signed)
OT Cancellation Note and Discharge  Patient Details Name: Kenneth [name] MRN: [id number] DOB: 1955/11/15   Cancelled Treatment:    Reason Eval/Treat Not Completed: OT screened, no needs identified, will sign off. Spoke to PT who had just evaled the patient and reports that pt is generally weak, but was able to do everything that he asked him to do and does not feel there are any OT needs, pt just needs to be up and moving with PT and nursing. Acute OT will sign off.  Almon Register [id number] 06/25/2014, 4:15 PM

## 2014-06-25 NOTE — Evaluation (Signed)
Physical Therapy Evaluation Patient Details Name: Kenneth [name] MRN: [id number] DOB: June 03, 1955 Today's Date: 06/25/2014   History of Present Illness  pt is a 59 y/o male admitted for abdominal distension, but on admission noted to have supratherapeutic INR over 8.  Work up continues.  Clinical Impression  Pt admitted with/for abdominal distension, but found to have INR's >8.  Pt currently limited functionally due to the problems listed below.  (see problems list.)  Pt will benefit from PT to maximize function and safety to be able to get home safely with limited available assist .     Follow Up Recommendations Home health PT    Equipment Recommendations  None recommended by PT    Recommendations for Other Services       Precautions / Restrictions Precautions Precautions: None      Mobility  Bed Mobility Overal bed mobility: Independent                Transfers Overall transfer level: Independent                  Ambulation/Gait Ambulation/Gait assistance: Supervision Ambulation Distance (Feet): 500 Feet Assistive device: None Gait Pattern/deviations: Step-through pattern Gait velocity: functional Gait velocity interpretation: >2.62 ft/sec, indicative of independent community ambulator General Gait Details: generally steady, pt struggled mildly to get speed up to age appropriate levels, suspect due to his mild weaknesses,.  Stairs Stairs: Yes Stairs assistance: Supervision Stair Management: One rail Right;Alternating pattern;Forwards Number of Stairs: 4 General stair comments: safe, but mild struggle going up due to LE weakness  Wheelchair Mobility    Modified Rankin (Stroke Patients Only)       Balance                                             Pertinent Vitals/Pain Pain Assessment: No/denies pain    Home Living Family/patient expects to be discharged to:: Private residence Living Arrangements: Alone Available  Help at Discharge: Other (Comment) Type of Home: Other(Comment) (condo) Home Access: Level entry     Home Layout: One level Home Equipment: Shower seat - built in      Prior Function Level of Independence: Independent with assistive device(s)         Comments: drives; works at [employer] in [place] Hand: Right    Extremity/Trunk Assessment   Upper Extremity Assessment: Overall WFL for tasks assessed (some general weakness L >R Ue)           Lower Extremity Assessment: Overall WFL for tasks assessed (mild proximal and truncal weaknesses)         Communication   Communication: No difficulties  Cognition Arousal/Alertness: Awake/alert Behavior During Therapy: WFL for tasks assessed/performed Overall Cognitive Status: Within Functional Limits for tasks assessed                      General Comments      Exercises        Assessment/Plan    PT Assessment Patient needs continued PT services  PT Diagnosis Difficulty walking   PT Problem List Decreased strength;Decreased activity tolerance;Decreased mobility  PT Treatment Interventions Gait training;Stair training;Functional mobility training;Therapeutic activities;Therapeutic exercise;Patient/family education   PT Goals (Current goals can be found in the Care Plan section) Acute Rehab PT Goals Patient Stated Goal: stronger PT  Goal Formulation: With patient Time For Goal Achievement: 07/02/14 Potential to Achieve Goals: Good    Frequency Min 3X/week   Barriers to discharge Decreased caregiver support      Co-evaluation               End of Session   Activity Tolerance: Patient tolerated treatment well Patient left: in bed;with call bell/phone within reach Nurse Communication: Mobility status    Functional Assessment Tool Used: clinical judgement Functional Limitation: Mobility: Walking and moving around Mobility: Walking and Moving Around Current Status  [id number]): At least 1 percent but less than 20 percent impaired, limited or restricted Mobility: Walking and Moving Around Goal Status [phone]): At least 1 percent but less than 20 percent impaired, limited or restricted Mobility: Walking and Moving Around Discharge Status [phone]): At least 1 percent but less than 20 percent impaired, limited or restricted    Time: 1510-1530 PT Time Calculation (min) (ACUTE ONLY): 20 min   Charges:   PT Evaluation $Initial PT Evaluation Tier I: 1 Procedure     PT G Codes:   PT G-Codes **NOT FOR INPATIENT CLASS** Functional Assessment Tool Used: clinical judgement Functional Limitation: Mobility: Walking and moving around Mobility: Walking and Moving Around Current Status [id number]): At least 1 percent but less than 20 percent impaired, limited or restricted Mobility: Walking and Moving Around Goal Status [phone]): At least 1 percent but less than 20 percent impaired, limited or restricted Mobility: Walking and Moving Around Discharge Status [phone]): At least 1 percent but less than 20 percent impaired, limited or restricted    Johni Narine, Tessie Fass 06/25/2014, 4:51 PM  06/25/2014  Donnella Sham, PT [phone]  (pager)

## 2014-06-25 NOTE — Progress Notes (Signed)
Patient ID: Kenneth [name], male   DOB: 1955/06/07, 59 y.o.   MRN: [id number]         [facility]    Date of Admission:  06/22/2014     Principal Problem:   Supratherapeutic INR Active Problems:   HIV disease   Cough   Anemia   Essential hypertension   Chronic kidney disease (CKD), stage II (mild)   Protein-calorie malnutrition, severe   Lupus nephritis   Anasarca   Abdominal distension   Essential hypertension, benign   . abacavir  600 mg Oral Daily  . amLODipine  5 mg Oral Daily  . atorvastatin  40 mg Oral q1800  . dolutegravir  50 mg Oral Daily  . lamiVUDine  50 mg Oral Daily  . pantoprazole  40 mg Oral BID  . sodium chloride  3 mL Intravenous Q12H  . Warfarin - Pharmacist Dosing Inpatient   Does not apply q1800    Subjective: He had some nausea and difficulty swallowing again with lunch today.  Review of Systems: Review of systems not obtained due to patient factors.  Past Medical History  Diagnosis Date  . HIV infection dx ~ 2009    a. 02/13/2014 CD4 = 240 - undetectable viral load.  . Hyperlipidemia   . Hypertension   . Insomnia   . Dysphagia 2008    post heller myotomy/toupee fundoplication.    . Achalasia   . Renal abscess   . Hemorrhoids, internal   . Premature ventricular contractions   . Pancytopenia   . CKD (chronic kidney disease), stage II   . Lupus nephritis   . SLE (systemic lupus erythematosus) dx'd 2015  . Candida infection, esophageal 08/2010    a. 2012 - noted on EGD.  Marland Kitchen DVT (deep venous thrombosis) ~ 04/2014  . History of blood transfusion 03/2014    "low counts"  . History of stomach ulcers     History  Substance Use Topics  . Smoking status: Never Smoker   . Smokeless tobacco: Never Used  . Alcohol Use: No    Family History  Problem Relation Age of Onset  . Colon cancer Father     deceased  . Other Mother     s/p pacemaker - alive and well.  . Other      5 brothers, 3 sisters - alive and  well.   Allergies  Allergen Reactions  . Amoxicillin     REACTION: diffuse rash    OBJECTIVE: Blood pressure 139/89, pulse 74, temperature 98.3 F (36.8 C), temperature source Oral, resp. rate 16, height 6\' 1"  (1.854 m), weight 173 lb 9.6 oz (78.744 kg), SpO2 100 %. General: he remains lethargic Oral: No oropharyngeal lesions Lungs: clear Cor: regular S1 and S2 with no murmur Abdomen: soft and nontender  Lab Results Lab Results  Component Value Date   WBC 6.3 06/25/2014   HGB 8.3* 06/25/2014   HCT 25.4* 06/25/2014   MCV 91.4 06/25/2014   PLT 242 06/25/2014    Lab Results  Component Value Date   CREATININE 9.09* 06/25/2014   BUN 73* 06/25/2014   NA 140 06/25/2014   K 4.4 06/25/2014   CL 112 06/25/2014   CO2 20 06/25/2014    Lab Results  Component Value Date   ALT 11 06/22/2014   AST 16 06/22/2014   ALKPHOS 63 06/22/2014   BILITOT 0.3 06/22/2014    HIV viral load of 06/22/2014: 30 copies per mL HIV 1 RNA QUANT (  copies/mL)  Date Value  03/17/2014 109*  02/13/2014 <20  11/11/2013 <20   CD4 T CELL ABS (/uL)  Date Value  06/22/2014 370*  03/17/2014 100*  02/13/2014 240*   Assessment: Despite not being on his HIV medications consistently when he was out of town recently, his HIV infection remains under fairly good control. I will continue his current antiretroviral regimen.  Plan: 1. Continue current antiretroviral therapy 2. I will arrange followup in our clinic with [name] in a few weeks 3. I will sign off now  Michel Bickers, MD [facility] [phone] pager   [phone] cell 06/25/2014, 3:03 PM

## 2014-06-25 NOTE — Progress Notes (Signed)
ANTICOAGULATION CONSULT NOTE - Follow-up  Pharmacy Consult for warfarin Indication: DVT  Allergies  Allergen Reactions  . Amoxicillin     REACTION: diffuse rash    Patient Measurements: Height: 6\' 1"  (185.4 cm) Weight: 173 lb 9.6 oz (78.744 kg) IBW/kg (Calculated) : 79.9  Vital Signs: Temp: 99.5 F (37.5 C) (03/24 0515) Temp Source: Oral (03/24 0515) BP: 124/80 mmHg (03/24 0515) Pulse Rate: 84 (03/23 2134)  Labs:  Recent Labs  06/23/14 0645 06/24/14 [id number] 06/25/14 0745  HGB 8.7* 9.0* 8.3*  HCT 27.2* 27.5* 25.4*  PLT 289 268 242  LABPROT 63.5* 55.3* 45.8*  INR 7.40* 6.19* 4.87*  CREATININE 9.13* 9.16* 9.09*    Estimated Creatinine Clearance: 10.1 mL/min (by C-G formula based on Cr of 9.09).  Assessment: 42 yom presented to the hospital after he was found to have a supratherapeutic INR. Today INR remains elevated at 4.87. No bleeding noted. H/H low but now stable. Plts are WNL.  Goal of Therapy:  INR 2-3   Plan:  - No coumadin tonight - Daily INR  Kenneth [name], Rande Lawman 06/25/2014,8:38 AM

## 2014-06-25 NOTE — Progress Notes (Signed)
Family Medicine Teaching Service Daily Progress Note Intern Pager: [phone]  Patient name: Kenneth [name] Medical record number: [id number] Date of birth: 1955-11-16 Age: 59 y.o. Gender: male  Primary Care Provider: Cordelia Poche, MD Consultants: ID Code Status: Full  Pt Overview and Major Events to Date:  3/21: Admitted for lab abnormalities and concerning physical exam  Assessment and Plan: SEARS HAAG is a 59 y.o. male presenting with cough and abdominal distension in clinic, found to have supratherapeutic INR to ~8 . PMH is significant for abdominal pain, anemia, DVT, CKD II, ED, HTN, hemorrhoids, HIV, HLD, lupus, PVCs, protein calorie malnutrition.  ESRD: Most recent creatinine prior to admission 2.77 and GFR 28 03/20/14. However on admission Cr 9.56 and BUN 64. Patient is not making much urine. Progression of renal disease. 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 Patient now agreeable to HD. - Renal US shows R. Surgical nephrectomy. L kidney with medical renal disease. - trend BMP - Cr stable ~9 - nephrology consulted; appreciate recs - now in process of coordinating HD - strict I/Os -Believe depression playing a part but patient declines wanting treatment at this time. Will continue to encourage and support patient. -Vascular planning on A-V fistula procedure Monday once INR is at least <2.  -Will get temporary permcath in the meantime for HD.   Supratherapeutic INR/Recent DVT INR in clinic >8. Value later verified at 6.3. On coumadin for DVT in January in [place], no records for verification of details. Some concern in clinic of poor understanding of meds (taking when told to stop taking) / lost to f/u. Reports no current bleeding. Decreasing appropriately.  - Continue to monitor - Pharmacy consulted for supratherapeutic INR. - Hold coumadin - Hold VTE pharmacologic ppx; SCDs - Up with assistance - trend PT/INR - improving INR today 4.9 - if continues to remain  elevated or signs of bleeding consider Vit. K or FFP for reversal  Anasarca with associated cough Unclear etiology. No reported dyspnea. Lung exam mild decreased aeration bases. Reports LE edema is somewhat chronic. Echo 03/2014 with nl LV function and grade 1 diastolic dysfunction with trace MR and TR. Broad ddx due to chronicity (concern for malignancy), h/o HIV (opportunistic infection with unclear if taking medications). Noted a few weeks now, no tenderness, moderate tachycardia on admission. Believe patient to be intracellularly deplete but with fluid overload and renal failure do not want to give IVF at this time. - BNP 197 elevated; no prior ones to compare - CT chest with small bilateral effusions and small pericardial effusion - TSH wnl - Echo showing grade 1 diastolic dysfunction with normal EF. Pericardial effusion has enlarged.   -With fluid overload dialysis should improve this. No need to consult cardiology at this time - encourage PO hydration; SLIV - hoping patient gets HD to help with fluid status - PT/OT eval -Albumin 1.1   Anemia Patient reports previously having blood transfusion about a month ago for anemia. He denies bleeding. Says they did not find a source for low Hbg. Denies any recent colonoscopy. Renal insufficiency likely playing a factor and also anemia of chronic disease. FH of colon cancer. Consider malignancy with stool character change. He has daily BM but it is a very small amount and slightly liquid. Hbg remaining stable baseline appears to be 8-10.  - consult GI; appreciate recs >>signed off  - barium swallow completed - no need for immediate intervention. He will follow-up as outpatient.  -CT with chronic esophageal dilation with fluid  and debris within; also irregular hepatic capsule - trend CBC - Hbg stable - FOBT negative - anemia labs ordered - low iron levels - ESA started 3/23 - consider transfusing if Hbg <7 and/or patient symtpomatic  HIV Unclear  what medications he is taking from home med list as patient is unclear about this in clinic. Previously seen by Dr [name] (ID). States he was taking his HIV medications. - ID consulted; appreciate recs  - HAART therapy restarted - fluconazole added for recent hospitalization for esophageal candidiasis requiring esophageal dilation - CD4 count 370 so less likely to have HIV related opportunistic infection  - viral load 30.  HTN Stable - Continue home norvasc, decr to 5mg  daily due to unsure about his compliance.  Lupus Recent dx per patient, prednisone listed on med list but he states he is not taking.  - Will not order at this time.   -per nephrology not needed  HLD - Continued home atorvastatin  FEN/GI: Heart Healthy diet; PPI BID Prophylaxis: SCDs given supratherapeutic INR  Disposition: Continue current management; pending further work-up.  Subjective:  Patient with no complaints this morning. Is not really interactive. He does say that he would like to go through with HD and that decision was helped along by family. He is still very worried about his job. Discussed with patient that he should talk to his employer to see what modifications can be made for him. Of note patient seems to have a depressive mood and when asked about this patient denies any depression.   Objective: Temp:  [98.1 F (36.7 C)-99.5 F (37.5 C)] 99.5 F (37.5 C) (03/24 0515) Pulse Rate:  [79-84] 84 (03/23 2134) Resp:  [16] 16 (03/24 0515) BP: (124-142)/(80-90) 124/80 mmHg (03/24 0515) SpO2:  [99 %-100 %] 100 % (03/24 0515) Physical Exam: General: NAD, somber-appearing, awake HEENT: AT/[place], sclera clear, EOMI, o/p clear Cardiovascular: RRR, no m/r/g Respiratory: CTAB, normal effort Abdomen: Soft, nontender, nondistended, no obvious organomegaly, +BS Extremities: 3+ LE edema to thighs, pitting; no asymmetry or calf tenderness Skin: No rash or cyanosis, no bruising Neuro: Awake, alert, grossly  nonfocal, moves all extremities normally   Intake/Output Summary (Last 24 hours) at 06/25/14 1124 Last data filed at 06/25/14 0949  Gross per 24 hour  Intake    564 ml  Output    800 ml  Net   -236 ml   Laboratory: Results for orders placed or performed during the hospital encounter of 06/22/14 (from the past 24 hour(s))  Protime-INR     Status: Abnormal   Collection Time: 06/25/14  7:45 AM  Result Value Ref Range   Prothrombin Time 45.8 (H) 11.6 - 15.2 seconds   INR 4.87 (H) 0.00 - 1.49  CBC     Status: Abnormal   Collection Time: 06/25/14  7:45 AM  Result Value Ref Range   WBC 6.3 4.0 - 10.5 K/uL   RBC 2.78 (L) 4.22 - 5.81 MIL/uL   Hemoglobin 8.3 (L) 13.0 - 17.0 g/dL   HCT 25.4 (L) 39.0 - 52.0 %   MCV 91.4 78.0 - 100.0 fL   MCH 29.9 26.0 - 34.0 pg   MCHC 32.7 30.0 - 36.0 g/dL   RDW 14.9 11.5 - 15.5 %   Platelets 242 150 - 400 K/uL  Basic metabolic panel     Status: Abnormal   Collection Time: 06/25/14  7:45 AM  Result Value Ref Range   Sodium 140 135 - 145 mmol/L   Potassium 4.4 3.5 -  5.1 mmol/L   Chloride 112 96 - 112 mmol/L   CO2 20 19 - 32 mmol/L   Glucose, Bld 92 70 - 99 mg/dL   BUN 73 (H) 6 - 23 mg/dL   Creatinine, Ser 9.09 (H) 0.50 - 1.35 mg/dL   Calcium 7.7 (L) 8.4 - 10.5 mg/dL   GFR calc non Af Amer 6 (L) >90 mL/min   GFR calc Af Amer 7 (L) >90 mL/min   Anion gap 8 5 - 15    Imaging/Diagnostic Tests: Ct Chest and Abdomen Pelvis Wo Contrast  06/22/2014  IMPRESSION: CT CHEST IMPRESSION  1. Moderate degradation, secondary to lack of contrast, paucity of fat, and extent of anasarca. 2. Small bilateral pleural effusions with adjacent subsegmental atelectasis. 3. New pericardial effusion, small. 4. Chronic esophageal dilatation with fluid and debris within. This suggests a component of obstruction at the gastroesophageal junction and/or esophageal dysmotility.  CT ABDOMEN AND PELVIS IMPRESSION  1. Development of moderate abdominal pelvic ascites. 2. Apparent  irregular hepatic capsule which could be CT artifactual. Interval development of cirrhosis since 03/16/2014 is felt unlikely. Correlate with risk factors. 3. Cholelithiasis. 4. Anasarca.     Katheren Shams, DO 06/25/2014, 11:24 AM PGY-1, [place] Intern pager: [phone], text pages welcome

## 2014-06-25 NOTE — Progress Notes (Signed)
Pt states he is still having issues with feeling like food to sticking or stopping some in his esophagus. Pt spit up some of his tea, pill given did not come up.

## 2014-06-25 NOTE — Progress Notes (Signed)
GASTROENTEROLOGY PROGRESS NOTE  Problem:   1.  Dysphagia, h/o ? Achalasia (s/o ?Heller myotomy) 2.  Anemia, normocytic 3.  ESRD 4.  Over-anticoagulation  Subjective: Swallowing better--the acute "plug" that he had at time of admission has resolved. He states he's decided to go ahead w/ dialysis.  Objective: Barium swallow shows sluggish esophageal emptying, poor motility, no fixed stricture or tumor.  The retained food noted on his admission CT has cleared out.  (Watched procedure and discussed w/ radiologist and pt)  INR still supra-therapeutic at 4.9  Assessment: Probable recurrent achalasia, with ?transient food impaction now resolved.  Plan:  Have made appt w/ Dr. [name] for office f/u on April 4th at 1 pm.    In the meantime, pt instructed to avoid peels or other food that may "plug up" the esophagus, to eat small bites and chew well, and to intersperse swallows of liquids with boluses of solid food.  Since pt does empty his esophagus, there is no need for immediate intervention, but once pt is medically more stable (eg, being dialyzed, fluid status normalized, and INR in therapeutic range), he would be a candidate for further management (?? Re-do Heller myotomy, ?? Botox, ??updated manometry, ??esophageal dilatation).  He might need referral to an esophageal specialist at [facility] to help guide management.  Will sign off, but call if we can be of further assistance during this hospitalization.  Cleotis Nipper, M.D. 06/25/2014 8:59 AM

## 2014-06-26 DIAGNOSIS — N186 End stage renal disease: Secondary | ICD-10-CM

## 2014-06-26 LAB — CBC
HCT: 26.6 % — ABNORMAL LOW (ref 39.0–52.0)
HEMOGLOBIN: 8.7 g/dL — AB (ref 13.0–17.0)
MCH: 29.7 pg (ref 26.0–34.0)
MCHC: 32.7 g/dL (ref 30.0–36.0)
MCV: 90.8 fL (ref 78.0–100.0)
Platelets: 254 10*3/uL (ref 150–400)
RBC: 2.93 MIL/uL — ABNORMAL LOW (ref 4.22–5.81)
RDW: 14.8 % (ref 11.5–15.5)
WBC: 5.6 10*3/uL (ref 4.0–10.5)

## 2014-06-26 LAB — PROTIME-INR
INR: 2.31 — AB (ref 0.00–1.49)
PROTHROMBIN TIME: 25.6 s — AB (ref 11.6–15.2)

## 2014-06-26 LAB — BASIC METABOLIC PANEL
Anion gap: 10 (ref 5–15)
BUN: 67 mg/dL — ABNORMAL HIGH (ref 6–23)
CO2: 18 mmol/L — AB (ref 19–32)
CREATININE: 9.32 mg/dL — AB (ref 0.50–1.35)
Calcium: 7.8 mg/dL — ABNORMAL LOW (ref 8.4–10.5)
Chloride: 113 mmol/L — ABNORMAL HIGH (ref 96–112)
GFR, EST AFRICAN AMERICAN: 6 mL/min — AB (ref >90)
GFR, EST NON AFRICAN AMERICAN: 6 mL/min — AB (ref >90)
Glucose, Bld: 84 mg/dL (ref 70–99)
Potassium: 4.3 mmol/L (ref 3.5–5.1)
SODIUM: 141 mmol/L (ref 135–145)

## 2014-06-26 MED ORDER — ONDANSETRON HCL 4 MG/2ML IJ SOLN
4.0000 mg | Freq: Four times a day (QID) | INTRAMUSCULAR | Status: DC | PRN
  Administered 2014-06-26: 4 mg via INTRAVENOUS
  Filled 2014-06-26 (×2): qty 2

## 2014-06-26 MED ORDER — SODIUM CHLORIDE 0.9 % IV SOLN: INTRAVENOUS | Status: DC | PRN

## 2014-06-26 MED ORDER — GLUCAGON HCL RDNA (DIAGNOSTIC) 1 MG IJ SOLR
INTRAMUSCULAR | Status: AC
  Filled 2014-06-26: qty 1

## 2014-06-26 MED ORDER — PROMETHAZINE HCL 25 MG/ML IJ SOLN
12.5000 mg | INTRAMUSCULAR | Status: DC | PRN
  Administered 2014-06-26 – 2014-06-30 (×3): 12.5 mg via INTRAVENOUS
  Filled 2014-06-26 (×3): qty 1

## 2014-06-26 MED ORDER — SODIUM CHLORIDE 0.9 % IV SOLN: INTRAVENOUS | Status: DC

## 2014-06-26 MED ORDER — PANTOPRAZOLE SODIUM 40 MG IV SOLR
40.0000 mg | Freq: Two times a day (BID) | INTRAVENOUS | Status: DC
  Administered 2014-06-26 – 2014-07-04 (×15): 40 mg via INTRAVENOUS
  Filled 2014-06-26 (×17): qty 40

## 2014-06-26 MED ORDER — SODIUM CHLORIDE 0.9 % IV SOLN
INTRAVENOUS | Status: DC
  Administered 2014-06-27: 500 mL via INTRAVENOUS

## 2014-06-26 MED ORDER — GLUCAGON HCL RDNA (DIAGNOSTIC) 1 MG IJ SOLR
0.5000 mg | Freq: Once | INTRAMUSCULAR | Status: AC
  Administered 2014-06-26: 0.5 mg via INTRAVENOUS

## 2014-06-26 NOTE — Progress Notes (Signed)
[facility] ROUNDING NOTE   Subjective:   Interval History: much brighter this morning  Voices no needs  Objective:  Vital signs in last 24 hours:  Temp:  [98 F (36.7 C)-98.7 F (37.1 C)] 98.7 F (37.1 C) (03/25 0536) Pulse Rate:  [73-93] 93 (03/25 0536) Resp:  [18] 18 (03/25 0536) BP: (125-144)/(84-106) 144/106 mmHg (03/25 0939) SpO2:  [98 %-100 %] 98 % (03/25 0536)  Weight change:  Filed Weights   06/22/14 1528 06/22/14 1700  Weight: 78.744 kg (173 lb 9.6 oz) 78.744 kg (173 lb 9.6 oz)    Intake/Output: I/O last 3 completed shifts: In: 55 [P.O.:682] Out: 750 [Urine:750]   Intake/Output this shift:  Total I/O In: -  Out: 50 [Emesis/NG output:50]  CVS- RRR RS- CTA ABD- BS present soft non-distended EXT- no edema   Basic Metabolic Panel:  Recent Labs Lab 06/22/14 2006 06/23/14 0645 06/24/14 0637 06/25/14 0745 06/26/14 0758  NA 140 141 141 140 141  K 5.5* 5.4* 4.9 4.4 4.3  CL 113* 112 115* 112 113*  CO2 17* 20 22 20  18*  GLUCOSE 107* 93 85 92 84  BUN 65* 62* 63* 73* 67*  CREATININE 9.12* 9.13* 9.16* 9.09* 9.32*  CALCIUM 7.8* 7.7* 7.7* 7.7* 7.8*    Liver Function Tests:  Recent Labs Lab 06/22/14 1700  AST 16  ALT 11  ALKPHOS 63  BILITOT 0.3  PROT 6.3  ALBUMIN 1.1*   No results for input(s): LIPASE, AMYLASE in the last 168 hours. No results for input(s): AMMONIA in the last 168 hours.  CBC:  Recent Labs Lab 06/22/14 1700 06/22/14 2006 06/23/14 0645 06/24/14 0637 06/25/14 0745 06/26/14 0758  WBC 3.2* 5.6 7.0 6.4 6.3 5.6  NEUTROABS 1.9  --   --   --   --   --   HGB 18.2* 10.0* 8.7* 9.0* 8.3* 8.7*  HCT 54.8* 30.3* 27.2* 27.5* 25.4* 26.6*  MCV 90.4 92.9 91.3 90.2 91.4 90.8  PLT 142* 258 289 268 242 254    Cardiac Enzymes: No results for input(s): CKTOTAL, CKMB, CKMBINDEX, TROPONINI in the last 168 hours.  BNP: Invalid input(s): POCBNP  CBG: No results for input(s): GLUCAP in the last 168  hours.  Microbiology: Results for orders placed or performed during the hospital encounter of 03/16/14  Clostridium Difficile by PCR     Status: None   Collection Time: 03/16/14 12:02 PM  Result Value Ref Range Status   C difficile by pcr NEGATIVE NEGATIVE Final  Cryptosporidium Smear, Fecal     Status: None   Collection Time: 03/17/14  9:21 PM  Result Value Ref Range Status   Specimen Description STOOL  Final   Special Requests NONE  Final   Cryptosporidium Smear.   Final    NO Cryptosporidium Cyclospora or Isospora seen. Performed at [facility]    Report Status 03/18/2014 FINAL  Final  Ova and parasite examination     Status: None   Collection Time: 03/17/14  9:22 PM  Result Value Ref Range Status   Specimen Description STOOL  Final   Special Requests NONE  Final   Ova and parasites   Final    NO OVA OR PARASITES SEEN Performed at [facility]    Report Status 03/18/2014 FINAL  Final    Coagulation Studies:  Recent Labs  06/24/14 0637 06/25/14 0745 06/26/14 0758  LABPROT 55.3* 45.8* 25.6*  INR 6.19* 4.87* 2.31*    Urinalysis: No results for input(s): COLORURINE,  LABSPEC, PHURINE, GLUCOSEU, HGBUR, BILIRUBINUR, KETONESUR, PROTEINUR, UROBILINOGEN, NITRITE, LEUKOCYTESUR in the last 72 hours.  Invalid input(s): APPERANCEUR    Imaging: Dg Esophagus  06/25/2014   CLINICAL DATA:  Dysphagia.  HIV.  History of esophageal dilatations.  EXAM: ESOPHOGRAM/BARIUM SWALLOW  TECHNIQUE: Combined double contrast and single contrast examination performed using effervescent crystals, thick barium liquid, and thin barium liquid.  FLUOROSCOPY TIME:  2 min 28 seconds. Entrance does 19 mGy. Twenty-nine images acquired, most fluoro grabs  COMPARISON:  Chest CT from 3 days ago.  12/12/2005  FINDINGS: Pharyngeal function is normal. There is mild constriction at the level of the upper cervical esophagus, without flow limiting web or mass lesion, stable from previous and likely  from small osteophytes.  The esophagus is patulous, as noted previously. Most of the previously noted fluid and debris has cleared from the esophagus compared to previous CT. No discrete mucosal mass or ulceration is noted. The esophagus is markedly dilated, and does taper and slowly empty at the GE junction, where there has been anti reflux procedure which results in fairly smooth distortion of the gastroesophageal junction. There is no mucosal mounding or ulceration suggestive of a mass at the gastroesophageal junction. There is esophageal dysmotility, but the esophagus is not completely akinetic, with weak tertiary contractions noted intermittently. No herniation of stomach.  IMPRESSION: 1. Stable exam since 2007. 2. Esophageal dysmotility and dilation, but no akinesia typical of achalasia. Non-fixed narrowing at the GE junction, at least partly related to remote anti reflux surgery.   Electronically Signed   By: Monte Fantasia M.D.   On: 06/25/2014 09:46     Medications:     . abacavir  600 mg Oral Daily  . amLODipine  5 mg Oral Daily  . atorvastatin  40 mg Oral q1800  . dolutegravir  50 mg Oral Daily  . lamiVUDine  50 mg Oral Daily  . pantoprazole  40 mg Oral BID  . 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 Pateint has agree to starting dialysis and I have called [facility] to place permcath and fistula Monday  Bones PTH 94  HTN controlled at present  Anemia ESA started  Depression ongoing  Metabolic acidosis  May start bicarbonate  Have initiated CLIP process        LOS: 4 Kenneth [name] W @TODAY @10 :00 AM

## 2014-06-26 NOTE — Progress Notes (Signed)
Family Medicine Teaching Service Daily Progress Note Intern Pager: [phone]  Patient name: Kenneth [name] Medical record number: [id number] Date of birth: 01-14-1956 Age: 59 y.o. Gender: male  Primary Care Provider: Cordelia Poche, MD Consultants: ID Code Status: Full  Pt Overview and Major Events to Date:  3/21: Admitted for lab abnormalities and concerning physical exam 3/23: Patient agreeable to HD 3/24: Barium swallow completed 3/25: INR now in therapeutic range; N/V  Assessment and Plan: Kenneth [name] is a 59 y.o. male presenting with cough and abdominal distension in clinic, found to have supratherapeutic INR to ~8 . PMH is significant for abdominal pain, anemia, DVT, CKD II, ED, HTN, hemorrhoids, HIV, HLD, lupus, PVCs, protein calorie malnutrition.  ESRD: Most recent creatinine prior to admission 2.77 and GFR 28 03/20/14. However on admission Cr 9.56 and BUN 64. Patient is not making much urine. Progression of renal disease. 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 Patient now agreeable to HD. - Renal US shows R. Surgical nephrectomy. L kidney with medical renal disease. - trend BMP - Cr stable ~9 - nephrology consulted; appreciate recs - now in process of coordinating HD - strict I/Os -Vascular planning on A-V fistula procedure Monday once INR is at least <2.  -Will get temporary permcath in the meantime for HD.   Supratherapeutic INR/Recent DVT: INR in clinic >8. Value later verified at 6.3. On coumadin for DVT in January in [place], no records for verification of details. Some concern in clinic of poor understanding of meds (taking when told to stop taking) / lost to f/u. Reports no current bleeding. Decreasing appropriately.  - Continue to monitor - Pharmacy consulted for supratherapeutic INR. - Hold coumadin; will start heparin for DVT treatment and ability to easily discontinue for procedures - Up with assistance - trend PT/INR - improving INR today 2.3  Anasarca  with associated cough: Unclear etiology. No reported dyspnea. Lung exam mild decreased aeration. Reports LE edema is somewhat chronic. Albumin 1.1. BNP 197.  Believe patient to be intracellularly deplete but with fluid overload.  - CT chest with small bilateral effusions and small pericardial effusion - Echo showing grade 1 diastolic dysfunction with normal EF. Pericardial effusion has enlarged.   -With fluid overload dialysis should improve this. No need to consult cardiology at this time - encourage PO hydration - HD to help with fluid status - PT/OT eval - HHPT  Nausea/Vomiting: Recurrent issue for patient. Feels as though food stuck in throat. CT with chronic esophageal dilation with fluid and debris within. Patient with known esophageal dysmotility.  - prn IV Zofran and Phenergan added - IVF prn for hydration if continued n/v - consult GI; appreciate recs >>signed off  - barium swallow completed 3/24 - no need for immediate intervention. He will follow-up as outpatient.  - may need to re-consult GI if patient continue to have vomitus with solids and liquids.  Depression: Believe patient is suffering from depression but patient declines wanting treatment at this time. This will likely affect health outcomes if not treated appropriately. - Will continue to encourage and support patient.   Anemia: Patient reports previously having blood transfusion about a month ago for anemia. He denies bleeding. Says they did not find a source for low Hbg. Denies any recent colonoscopy. Renal insufficiency likely playing a factor and also anemia of chronic disease. FH of colon cancer. Consider malignancy with stool character change. He has daily BM but it is a very small amount and slightly liquid. Hbg remaining  stable baseline appears to be 8-10.  - Hbg stable - FOBT negative - ESA started 3/23 - consider transfusing if Hbg <7 and/or patient symptomatic  HIV: Unclear what medications he is taking from  home med list as patient is unclear about this in clinic. Previously seen by Dr [name] (ID). States he was taking his HIV medications. Viral load and CD4 count appropriate. No concern at this time for HIV related opportunistic infections.  - ID signed off .> outpatient follow-up arranged.  - continue HAART therapy   HTN: Stable - Continue home norvasc, decr to 5mg  daily due to unsure about his compliance.  Lupus: Recent dx per patient, prednisone listed on med list but he states he is not taking.  - Will not order at this time.   -per nephrology not needed  HLD - Continued home atorvastatin  FEN/GI: Heart Healthy diet; PPI BID Prophylaxis: SCDs given supratherapeutic INR  Disposition: Continue current management; pending further work-up.  Subjective:  Patient with  new onset vomiting this morning. He states that he feels like his food is again getting stuck in his throat. He said he tried to drink some fluid but that made it worse and he started to vomit. Vomit has food particles and fluid. Non-bloody, non-bilious.   Patient had no other overnight events.   Objective: Temp:  [98 F (36.7 C)-98.7 F (37.1 C)] 98.7 F (37.1 C) (03/25 0536) Pulse Rate:  [73-93] 93 (03/25 0536) Resp:  [18] 18 (03/25 0536) BP: (125-144)/(84-106) 144/106 mmHg (03/25 0939) SpO2:  [98 %-100 %] 98 % (03/25 0536) Physical Exam: General: NAD, somber-appearing, awake HEENT: AT/[place], sclera clear, EOMI, o/p clear Cardiovascular: RRR, no m/r/g Respiratory: CTAB, normal effort Abdomen: Soft, nontender, nondistended, no obvious organomegaly, +BS Extremities: 3+ LE edema to thighs, pitting; no asymmetry or calf tenderness Skin: No rash or cyanosis, no bruising Neuro: Awake, alert, grossly nonfocal, moves all extremities normally   Intake/Output Summary (Last 24 hours) at 06/26/14 1044 Last data filed at 06/26/14 0900  Gross per 24 hour  Intake    340 ml  Output    500 ml  Net   -160 ml    Laboratory: Results for orders placed or performed during the hospital encounter of 06/22/14 (from the past 24 hour(s))  Protime-INR     Status: Abnormal   Collection Time: 06/26/14  7:58 AM  Result Value Ref Range   Prothrombin Time 25.6 (H) 11.6 - 15.2 seconds   INR 2.31 (H) 0.00 - 1.49  CBC     Status: Abnormal   Collection Time: 06/26/14  7:58 AM  Result Value Ref Range   WBC 5.6 4.0 - 10.5 K/uL   RBC 2.93 (L) 4.22 - 5.81 MIL/uL   Hemoglobin 8.7 (L) 13.0 - 17.0 g/dL   HCT 26.6 (L) 39.0 - 52.0 %   MCV 90.8 78.0 - 100.0 fL   MCH 29.7 26.0 - 34.0 pg   MCHC 32.7 30.0 - 36.0 g/dL   RDW 14.8 11.5 - 15.5 %   Platelets 254 150 - 400 K/uL  Basic metabolic panel     Status: Abnormal   Collection Time: 06/26/14  7:58 AM  Result Value Ref Range   Sodium 141 135 - 145 mmol/L   Potassium 4.3 3.5 - 5.1 mmol/L   Chloride 113 (H) 96 - 112 mmol/L   CO2 18 (L) 19 - 32 mmol/L   Glucose, Bld 84 70 - 99 mg/dL   BUN 67 (H) 6 - 23  mg/dL   Creatinine, Ser 9.32 (H) 0.50 - 1.35 mg/dL   Calcium 7.8 (L) 8.4 - 10.5 mg/dL   GFR calc non Af Amer 6 (L) >90 mL/min   GFR calc Af Amer 6 (L) >90 mL/min   Anion gap 10 5 - 15    Imaging/Diagnostic Tests: Ct Chest and Abdomen Pelvis Wo Contrast  06/22/2014  IMPRESSION: CT CHEST IMPRESSION  1. Moderate degradation, secondary to lack of contrast, paucity of fat, and extent of anasarca. 2. Small bilateral pleural effusions with adjacent subsegmental atelectasis. 3. New pericardial effusion, small. 4. Chronic esophageal dilatation with fluid and debris within. This suggests a component of obstruction at the gastroesophageal junction and/or esophageal dysmotility.  CT ABDOMEN AND PELVIS IMPRESSION  1. Development of moderate abdominal pelvic ascites. 2. Apparent irregular hepatic capsule which could be CT artifactual. Interval development of cirrhosis since 03/16/2014 is felt unlikely. Correlate with risk factors. 3. Cholelithiasis. 4. Anasarca.     Katheren Shams, DO 06/26/2014, 10:44 AM PGY-1, [place] Intern pager: [phone], text pages welcome

## 2014-06-26 NOTE — Progress Notes (Signed)
ANTICOAGULATION CONSULT NOTE - Follow Up Consult  Pharmacy Consult for heparin when INR <2 Indication: DVT  Allergies  Allergen Reactions  . Amoxicillin     REACTION: diffuse rash    Patient Measurements: Height: 6\' 1"  (185.4 cm) Weight: 173 lb 9.6 oz (78.744 kg) IBW/kg (Calculated) : 79.9 Heparin Dosing Weight: 78.7 kg  Vital Signs: Temp: 98.7 F (37.1 C) (03/25 0536) Temp Source: Oral (03/25 0536) BP: 141/84 mmHg (03/25 0536) Pulse Rate: 93 (03/25 0536)  Labs:  Recent Labs  06/24/14 0637 06/25/14 0745  HGB 9.0* 8.3*  HCT 27.5* 25.4*  PLT 268 242  LABPROT 55.3* 45.8*  INR 6.19* 4.87*  CREATININE 9.16* 9.09*    Estimated Creatinine Clearance: 10.1 mL/min (by C-G formula based on Cr of 9.09).   Medications:  Scheduled:  . abacavir  600 mg Oral Daily  . amLODipine  5 mg Oral Daily  . atorvastatin  40 mg Oral q1800  . dolutegravir  50 mg Oral Daily  . lamiVUDine  50 mg Oral Daily  . pantoprazole  40 mg Oral BID  . sodium chloride  3 mL Intravenous Q12H  . Warfarin - Pharmacist Dosing Inpatient   Does not apply q1800   Infusions:    Assessment: 59 yo male with hx of DVT was on coumadin prior to admission.  INR on admission was supratherapeutic.  INR now is back down to 2.31 after getting some FFP.  Patient will get perm cath for HD when INR comes down even more.  Team wants to start heparin when INR <2.  Goal of Therapy:  8hr heparin level 0.3-0.7 Monitor platelets by anticoagulation protocol: Yes   Plan:  - Redraw INR @1800  tonight.  If INR is <2, will start heparin then.  Detrick Dani, Tsz-Yin 06/26/2014,8:19 AM

## 2014-06-26 NOTE — Progress Notes (Signed)
[facility] Progress Note  Subjective: Pt with new onset vomiting this AM after french toast  Objective: Vital signs in last 24 hours: Temp:  [98 F (36.7 C)-98.7 F (37.1 C)] 98.1 F (36.7 C) (03/25 2052) Pulse Rate:  [73-104] 104 (03/25 2052) Resp:  [16-18] 18 (03/25 2052) BP: (125-152)/(84-108) 152/108 mmHg (03/25 2052) SpO2:  [98 %-100 %] 99 % (03/25 2052) Weight change:    PE:not examined  Lab Results: Results for orders placed or performed during the hospital encounter of 06/22/14 (from the past 24 hour(s))  Protime-INR     Status: Abnormal   Collection Time: 06/26/14  7:58 AM  Result Value Ref Range   Prothrombin Time 25.6 (H) 11.6 - 15.2 seconds   INR 2.31 (H) 0.00 - 1.49  CBC     Status: Abnormal   Collection Time: 06/26/14  7:58 AM  Result Value Ref Range   WBC 5.6 4.0 - 10.5 K/uL   RBC 2.93 (L) 4.22 - 5.81 MIL/uL   Hemoglobin 8.7 (L) 13.0 - 17.0 g/dL   HCT 26.6 (L) 39.0 - 52.0 %   MCV 90.8 78.0 - 100.0 fL   MCH 29.7 26.0 - 34.0 pg   MCHC 32.7 30.0 - 36.0 g/dL   RDW 14.8 11.5 - 15.5 %   Platelets 254 150 - 400 K/uL  Basic metabolic panel     Status: Abnormal   Collection Time: 06/26/14  7:58 AM  Result Value Ref Range   Sodium 141 135 - 145 mmol/L   Potassium 4.3 3.5 - 5.1 mmol/L   Chloride 113 (H) 96 - 112 mmol/L   CO2 18 (L) 19 - 32 mmol/L   Glucose, Bld 84 70 - 99 mg/dL   BUN 67 (H) 6 - 23 mg/dL   Creatinine, Ser 9.32 (H) 0.50 - 1.35 mg/dL   Calcium 7.8 (L) 8.4 - 10.5 mg/dL   GFR calc non Af Amer 6 (L) >90 mL/min   GFR calc Af Amer 6 (L) >90 mL/min   Anion gap 10 5 - 15    Studies/Results: Dg Esophagus  06/25/2014   CLINICAL DATA:  Dysphagia.  HIV.  History of esophageal dilatations.  EXAM: ESOPHOGRAM/BARIUM SWALLOW  TECHNIQUE: Combined double contrast and single contrast examination performed using effervescent crystals, thick barium liquid, and thin barium liquid.  FLUOROSCOPY TIME:  2 min 28 seconds. Entrance does 19 mGy. Twenty-nine  images acquired, most fluoro grabs  COMPARISON:  Chest CT from 3 days ago.  12/12/2005  FINDINGS: Pharyngeal function is normal. There is mild constriction at the level of the upper cervical esophagus, without flow limiting web or mass lesion, stable from previous and likely from small osteophytes.  The esophagus is patulous, as noted previously. Most of the previously noted fluid and debris has cleared from the esophagus compared to previous CT. No discrete mucosal mass or ulceration is noted. The esophagus is markedly dilated, and does taper and slowly empty at the GE junction, where there has been anti reflux procedure which results in fairly smooth distortion of the gastroesophageal junction. There is no mucosal mounding or ulceration suggestive of a mass at the gastroesophageal junction. There is esophageal dysmotility, but the esophagus is not completely akinetic, with weak tertiary contractions noted intermittently. No herniation of stomach.  IMPRESSION: 1. Stable exam since 2007. 2. Esophageal dysmotility and dilation, but no akinesia typical of achalasia. Non-fixed narrowing at the GE junction, at least partly related to remote anti reflux surgery.   Electronically Signed  By: Monte Fantasia M.D.   On: 06/25/2014 09:46      Assessment: Dysphagia, vomiting, achalaia like appearance on EGD  Plan:   NPO, EGD in AM    Kaileena Obi C 06/26/2014, 8:57 PM

## 2014-06-26 NOTE — Progress Notes (Signed)
UR completed 

## 2014-06-26 NOTE — Progress Notes (Signed)
Patient still nauseated after taking pill with applesauce. Seem to be nauseated and throwing up after any food or medication. Please consider changing meds to IV if possible. Skin is nontenting, doesn't appear dehydrated at this time.

## 2014-06-27 ENCOUNTER — Encounter (HOSPITAL_COMMUNITY): Payer: Self-pay

## 2014-06-27 ENCOUNTER — Encounter (HOSPITAL_COMMUNITY)
Admission: AD | Disposition: A | Discharge status: Home Health | Payer: Self-pay | Source: Ambulatory Visit | Attending: Family Medicine

## 2014-06-27 HISTORY — PX: ESOPHAGOGASTRODUODENOSCOPY: SHX5428

## 2014-06-27 LAB — BASIC METABOLIC PANEL
Anion gap: 9 (ref 5–15)
BUN: 72 mg/dL — ABNORMAL HIGH (ref 6–23)
CO2: 19 mmol/L (ref 19–32)
Calcium: 8.1 mg/dL — ABNORMAL LOW (ref 8.4–10.5)
Chloride: 114 mmol/L — ABNORMAL HIGH (ref 96–112)
Creatinine, Ser: 9.51 mg/dL — ABNORMAL HIGH (ref 0.50–1.35)
GFR, EST AFRICAN AMERICAN: 6 mL/min — AB (ref >90)
GFR, EST NON AFRICAN AMERICAN: 5 mL/min — AB (ref >90)
GLUCOSE: 73 mg/dL (ref 70–99)
Potassium: 4.4 mmol/L (ref 3.5–5.1)
Sodium: 142 mmol/L (ref 135–145)

## 2014-06-27 LAB — CBC
HEMATOCRIT: 28 % — AB (ref 39.0–52.0)
Hemoglobin: 9 g/dL — ABNORMAL LOW (ref 13.0–17.0)
MCH: 29.7 pg (ref 26.0–34.0)
MCHC: 32.1 g/dL (ref 30.0–36.0)
MCV: 92.4 fL (ref 78.0–100.0)
Platelets: 251 10*3/uL (ref 150–400)
RBC: 3.03 MIL/uL — AB (ref 4.22–5.81)
RDW: 14.9 % (ref 11.5–15.5)
WBC: 6.5 10*3/uL (ref 4.0–10.5)

## 2014-06-27 LAB — PROTIME-INR
INR: 1.7 — ABNORMAL HIGH (ref 0.00–1.49)
Prothrombin Time: 20.1 seconds — ABNORMAL HIGH (ref 11.6–15.2)

## 2014-06-27 LAB — HEPARIN LEVEL (UNFRACTIONATED): Heparin Unfractionated: 0.46 IU/mL (ref 0.30–0.70)

## 2014-06-27 SURGERY — EGD (ESOPHAGOGASTRODUODENOSCOPY)
Anesthesia: Moderate Sedation

## 2014-06-27 MED ORDER — FENTANYL CITRATE 0.05 MG/ML IJ SOLN
INTRAMUSCULAR | Status: DC | PRN
  Administered 2014-06-27 (×2): 25 ug via INTRAVENOUS

## 2014-06-27 MED ORDER — FENTANYL CITRATE 0.05 MG/ML IJ SOLN
INTRAMUSCULAR | Status: AC
  Filled 2014-06-27: qty 2

## 2014-06-27 MED ORDER — DIPHENHYDRAMINE HCL 50 MG/ML IJ SOLN
INTRAMUSCULAR | Status: AC
  Filled 2014-06-27: qty 1

## 2014-06-27 MED ORDER — MIDAZOLAM HCL 5 MG/ML IJ SOLN
INTRAMUSCULAR | Status: AC
  Filled 2014-06-27: qty 2

## 2014-06-27 MED ORDER — BUTAMBEN-TETRACAINE-BENZOCAINE 2-2-14 % EX AERO
INHALATION_SPRAY | CUTANEOUS | Status: DC | PRN
  Administered 2014-06-27: 2 via TOPICAL

## 2014-06-27 MED ORDER — HEPARIN (PORCINE) IN NACL 100-0.45 UNIT/ML-% IJ SOLN
1100.0000 [IU]/h | INTRAMUSCULAR | Status: DC
  Administered 2014-06-27 – 2014-06-28 (×2): 1100 [IU]/h via INTRAVENOUS
  Filled 2014-06-27 (×4): qty 250

## 2014-06-27 MED ORDER — MIDAZOLAM HCL 10 MG/2ML IJ SOLN
INTRAMUSCULAR | Status: DC | PRN
  Administered 2014-06-27 (×2): 2 mg via INTRAVENOUS

## 2014-06-27 NOTE — Progress Notes (Signed)
Family Medicine Teaching Service Daily Progress Note Intern Pager: [phone]  Patient name: Kenneth [name] Medical record number: [id number] Date of birth: 1955/04/17 Age: 59 y.o. Gender: male  Primary Care Provider: Cordelia Poche, MD Consultants: ID Code Status: Full  Pt Overview and Major Events to Date:  3/21: Admitted for lab abnormalities and concerning physical exam 3/23: Patient agreeable to HD 3/24: Barium swallow completed 3/25: INR now in therapeutic range; N/V 3/26: EGD demonstrates achalasia vs severe dysmotility  Assessment and Plan: Kenneth [name] is a 59 y.o. male presenting with cough and abdominal distension in clinic, found to have supratherapeutic INR to ~8 . PMH is significant for abdominal pain, anemia, DVT, CKD II, ED, HTN, hemorrhoids, HIV, HLD, lupus, PVCs, protein calorie malnutrition.  ESRD: Most recent creatinine prior to admission 2.77 and GFR 28 03/20/14. However on admission Cr 9.56 and BUN 64. Patient is not making much urine. Progression of renal disease. 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 Patient now agreeable to HD. - Renal US shows R. Surgical nephrectomy. L kidney with medical renal disease. - trend BMP - Cr stable ~9 - nephrology consulted; appreciate recs - now in process of coordinating HD - strict I/Os - Vascular planning on A-V fistula procedure Monday once INR is at least <2.  - Will get permcath in the meantime for HD.   Supratherapeutic INR/Recent DVT: INR in clinic >8. Value later verified at 6.3. On coumadin for DVT in January in [place], no records for verification of details. Some concern in clinic of poor understanding of meds (taking when told to stop taking) / lost to f/u. Reports no current bleeding. Decreasing appropriately.  - Continue to monitor - Pharmacy consulted for supratherapeutic INR. - Hold coumadin; will start heparin for DVT treatment and ability to easily discontinue for procedures - Up with assistance -  trend PT/INR - improving INR today 1.7  Anasarca with associated cough: Unclear etiology. No reported dyspnea. Lung exam mild decreased aeration. Reports LE edema is somewhat chronic. Albumin 1.1. BNP 197.  Believe patient to be intracellularly deplete but with fluid overload.  - CT chest with small bilateral effusions and small pericardial effusion - Echo showing grade 1 diastolic dysfunction with normal EF. Pericardial effusion has enlarged.   -With fluid overload dialysis should improve this. No need to consult cardiology at this time - encourage PO hydration - HD to help with fluid status - PT/OT eval - HHPT  Nausea/Vomiting: Recurrent issue for patient. Feels as though food stuck in throat. CT with chronic esophageal dilation with fluid and debris within. Patient with known esophageal dysmotility.  - prn IV Zofran and Phenergan added - IVF prn for hydration if continued n/v - EGD this am demonstrated retained food, suggestive of achalasia vs severe dysmotility - GI following and will review records and consider repeat EGD with botox injection  Depression: Believe patient is suffering from depression but patient declines wanting treatment at this time. This will likely affect health outcomes if not treated appropriately. - Will continue to encourage and support patient.   Anemia: Patient reports previously having blood transfusion about a month ago for anemia. He denies bleeding. Says they did not find a source for low Hbg. Denies any recent colonoscopy. Renal insufficiency likely playing a factor and also anemia of chronic disease. FH of colon cancer. Consider malignancy with stool character change. He has daily BM but it is a very small amount and slightly liquid. Hbg remaining stable baseline appears to be 8-10.  -  Hbg stable - FOBT negative - ESA started 3/23 - consider transfusing if Hbg <7 and/or patient symptomatic  HIV: Unclear what medications he is taking from home med list as  patient is unclear about this in clinic. Previously seen by Dr [name] (ID). States he was taking his HIV medications. Viral load and CD4 count appropriate. No concern at this time for HIV related opportunistic infections.  - ID signed off .> outpatient follow-up arranged.  - continue HAART therapy   HTN: Stable - Continue home norvasc, decr to 5mg  daily due to unsure about his compliance.  Lupus: Recent dx per patient, prednisone listed on med list but he states he is not taking.  - Will not order at this time.   -per nephrology not needed  HLD - Continued home atorvastatin  FEN/GI: Heart Healthy diet; PPI BID Prophylaxis: heparin gtt pending vascular procedures procedures  Disposition: Continue current management; pending further work-up.  Subjective:  No complaints, NAEON  Objective: Temp:  [98.1 F (36.7 C)-98.4 F (36.9 C)] 98.4 F (36.9 C) (03/26 0533) Pulse Rate:  [81-104] 81 (03/26 0533) Resp:  [16-18] 18 (03/26 0533) BP: (141-152)/(88-108) 148/88 mmHg (03/26 0533) SpO2:  [96 %-100 %] 96 % (03/26 0533) Weight:  [173 lb 7 oz (78.67 kg)] 173 lb 7 oz (78.67 kg) (03/26 0533) Physical Exam: General: NAD, somber-appearing, awake HEENT: AT/[place], sclera clear, EOMI, o/p clear Cardiovascular: RRR, no m/r/g Respiratory: CTAB, normal effort Abdomen: Soft, nontender, nondistended, no obvious organomegaly, +BS Extremities: 3+ LE edema to thighs, pitting; no asymmetry or calf tenderness Skin: No rash or cyanosis, no bruising Neuro: Awake, alert, grossly nonfocal, moves all extremities normally   Intake/Output Summary (Last 24 hours) at 06/27/14 0902 Last data filed at 06/26/14 2358  Gross per 24 hour  Intake    367 ml  Output    955 ml  Net   -588 ml   Laboratory: Results for orders placed or performed during the hospital encounter of 06/22/14 (from the past 24 hour(s))  Protime-INR     Status: Abnormal   Collection Time: 06/27/14  5:30 AM  Result Value Ref Range    Prothrombin Time 20.1 (H) 11.6 - 15.2 seconds   INR 1.70 (H) 0.00 - 1.49  CBC     Status: Abnormal   Collection Time: 06/27/14  5:30 AM  Result Value Ref Range   WBC 6.5 4.0 - 10.5 K/uL   RBC 3.03 (L) 4.22 - 5.81 MIL/uL   Hemoglobin 9.0 (L) 13.0 - 17.0 g/dL   HCT 28.0 (L) 39.0 - 52.0 %   MCV 92.4 78.0 - 100.0 fL   MCH 29.7 26.0 - 34.0 pg   MCHC 32.1 30.0 - 36.0 g/dL   RDW 14.9 11.5 - 15.5 %   Platelets 251 150 - 400 K/uL  Basic metabolic panel     Status: Abnormal   Collection Time: 06/27/14  5:30 AM  Result Value Ref Range   Sodium 142 135 - 145 mmol/L   Potassium 4.4 3.5 - 5.1 mmol/L   Chloride 114 (H) 96 - 112 mmol/L   CO2 19 19 - 32 mmol/L   Glucose, Bld 73 70 - 99 mg/dL   BUN 72 (H) 6 - 23 mg/dL   Creatinine, Ser 9.51 (H) 0.50 - 1.35 mg/dL   Calcium 8.1 (L) 8.4 - 10.5 mg/dL   GFR calc non Af Amer 5 (L) >90 mL/min   GFR calc Af Amer 6 (L) >90 mL/min   Anion gap 9  5 - 15    Imaging/Diagnostic Tests: Ct Chest and Abdomen Pelvis Wo Contrast  06/22/2014   CT CHEST IMPRESSION  1. Moderate degradation, secondary to lack of contrast, paucity of fat, and extent of anasarca. 2. Small bilateral pleural effusions with adjacent subsegmental atelectasis. 3. New pericardial effusion, small. 4. Chronic esophageal dilatation with fluid and debris within. This suggests a component of obstruction at the gastroesophageal junction and/or esophageal dysmotility.   CT ABDOMEN AND PELVIS IMPRESSION  1. Development of moderate abdominal pelvic ascites. 2. Apparent irregular hepatic capsule which could be CT artifactual. Interval development of cirrhosis since 03/16/2014 is felt unlikely. Correlate with risk factors. 3. Cholelithiasis. 4. Anasarca.     Frazier Richards, MD 06/27/2014, 9:02 AM PGY-2, [place] Intern pager: [phone], text pages welcome

## 2014-06-27 NOTE — Progress Notes (Signed)
Asked to reevaluate patient due to redevelopment of sensation of obstruction in his retrosternal area similar to that which she presented with an resolved. His barium swallow shows dilatation with sluggish emptying poor motility no fixed or stricture or tumor. However he now feels like he is unable to take in any oral intake. Will proceed with EGD this morning.

## 2014-06-27 NOTE — Op Note (Signed)
[facility] [place], [id number]   ENDOSCOPY PROCEDURE REPORT  PATIENT: Kenneth [name], Kenneth [name]  MR#: [id number] BIRTHDATE: 1955-07-01 , 59  yrs. old GENDER: male ENDOSCOPIST: [name], MD REFERRED BY: PROCEDURE DATE:  Jul 17, 2014 PROCEDURE: ASA CLASS: INDICATIONS:  dysphagia, suspicion of food impaction MEDICATIONS: 50 g fentanyl, 4 mg Versed TOPICAL ANESTHETIC:  DESCRIPTION OF PROCEDURE: After the risks benefits and alternatives of the procedure were thoroughly explained, informed consent was obtained.  The Pentax Gastroscope [id number] endoscope was introduced through the mouth and advanced to the second portion of the duodenum , Without limitations.  The instrument was slowly withdrawn as the mucosa was fully examined.    esophagus was dilated and full of old food. Most of this could easily be pushed through the GE junction into the stomach where there was a fair amount of old food as well. Multiple pushes with the scope cleared the esophagus of soft amorphous material without any dominant solid bolus. It did not appear to be any distal stricture. there is no suggestion of malignancyBecause of the large amount of food in the stomach I did not attempt to do a complete gastroscopy and duodenoscopy.       [     The scope was then withdrawn from the patient and the procedure completed.  COMPLICATIONS: There were no immediate complications.  ENDOSCOPIC IMPRESSION: retained food in esophagus, relieved endoscopically, suspect achalasia or severe motility disorder  RECOMMENDATIONS: begin clear liquid diet probably advance no further than dysphagia 3. We'll review previous workup and sitter repeat EGD later time with possible Botox injection.  REPEAT EXAM:  eSigned:  [name], MD 07/17/2014 10:10 AM    CC:  CPT CODES: ICD CODES:  The ICD and CPT codes recommended by this software are interpretations from the data that the  clinical staff has captured with the software.  The verification of the translation of this report to the ICD and CPT codes and modifiers is the sole responsibility of the health care institution and practicing physician where this report was generated.  [place] will not be held responsible for the validity of the ICD and CPT codes included on this report.  AMA assumes no liability for data contained or not contained herein. CPT is a Designer, television/film set of the [employer].

## 2014-06-27 NOTE — Progress Notes (Signed)
[name] ROUNDING NOTE   Subjective:   Interval History: EGD planned for this morning  Objective:  Vital signs in last 24 hours:  Temp:  [98 F (36.7 C)-98.4 F (36.9 C)] 98 F (36.7 C) (03/26 0933) Pulse Rate:  [76-104] 76 (03/26 0933) Resp:  [12-18] 12 (03/26 0933) BP: (141-164)/(88-108) 164/107 mmHg (03/26 0933) SpO2:  [96 %-100 %] 98 % (03/26 0933) Weight:  [78.67 kg (173 lb 7 oz)] 78.67 kg (173 lb 7 oz) (03/26 0533)  Weight change:  Filed Weights   06/22/14 1528 06/22/14 1700 06/27/14 0533  Weight: 78.744 kg (173 lb 9.6 oz) 78.744 kg (173 lb 9.6 oz) 78.67 kg (173 lb 7 oz)    Intake/Output: I/O last 3 completed shifts: In: 487 [P.O.:471; I.V.:16] Out: 1455 [Urine:1250; Emesis/NG output:205]   Intake/Output this shift:     CVS- RRR RS- CTA ABD- BS present soft non-distended EXT- no edema   Basic Metabolic Panel:  Recent Labs Lab 06/23/14 0645 06/24/14 0637 06/25/14 0745 06/26/14 0758 06/27/14 0530  NA 141 141 140 141 142  K 5.4* 4.9 4.4 4.3 4.4  CL 112 115* 112 113* 114*  CO2 20 22 20  18* 19  GLUCOSE 93 85 92 84 73  BUN 62* 63* 73* 67* 72*  CREATININE 9.13* 9.16* 9.09* 9.32* 9.51*  CALCIUM 7.7* 7.7* 7.7* 7.8* 8.1*    Liver Function Tests:  Recent Labs Lab 06/22/14 1700  AST 16  ALT 11  ALKPHOS 63  BILITOT 0.3  PROT 6.3  ALBUMIN 1.1*   No results for input(s): LIPASE, AMYLASE in the last 168 hours. No results for input(s): AMMONIA in the last 168 hours.  CBC:  Recent Labs Lab 06/22/14 1700  06/23/14 0645 06/24/14 0637 06/25/14 0745 06/26/14 0758 06/27/14 0530  WBC 3.2*  < > 7.0 6.4 6.3 5.6 6.5  NEUTROABS 1.9  --   --   --   --   --   --   HGB 18.2*  < > 8.7* 9.0* 8.3* 8.7* 9.0*  HCT 54.8*  < > 27.2* 27.5* 25.4* 26.6* 28.0*  MCV 90.4  < > 91.3 90.2 91.4 90.8 92.4  PLT 142*  < > 289 268 242 254 251  < > = values in this interval not displayed.  Cardiac Enzymes: No results for input(s): CKTOTAL, CKMB,  CKMBINDEX, TROPONINI in the last 168 hours.  BNP: Invalid input(s): POCBNP  CBG: No results for input(s): GLUCAP in the last 168 hours.  Microbiology: Results for orders placed or performed during the hospital encounter of 03/16/14  Clostridium Difficile by PCR     Status: None   Collection Time: 03/16/14 12:02 PM  Result Value Ref Range Status   C difficile by pcr NEGATIVE NEGATIVE Final  Cryptosporidium Smear, Fecal     Status: None   Collection Time: 03/17/14  9:21 PM  Result Value Ref Range Status   Specimen Description STOOL  Final   Special Requests NONE  Final   Cryptosporidium Smear.   Final    NO Cryptosporidium Cyclospora or Isospora seen. Performed at [facility]    Report Status 03/18/2014 FINAL  Final  Ova and parasite examination     Status: None   Collection Time: 03/17/14  9:22 PM  Result Value Ref Range Status   Specimen Description STOOL  Final   Special Requests NONE  Final   Ova and parasites   Final    NO OVA OR PARASITES SEEN Performed at [facility]    Report Status 03/18/2014 FINAL  Final    Coagulation Studies:  Recent Labs  06/25/14 0745 06/26/14 0758 06/27/14 0530  LABPROT 45.8* 25.6* 20.1*  INR 4.87* 2.31* 1.70*    Urinalysis: No results for input(s): COLORURINE, LABSPEC, PHURINE, GLUCOSEU, HGBUR, BILIRUBINUR, KETONESUR, PROTEINUR, UROBILINOGEN, NITRITE, LEUKOCYTESUR in the last 72 hours.  Invalid input(s): APPERANCEUR    Imaging: No results found.   Medications:   . sodium chloride    . heparin 1,100 Units/hr (06/27/14 0907)   . abacavir  600 mg Oral Daily  . amLODipine  5 mg Oral Daily  . atorvastatin  40 mg Oral q1800  . dolutegravir  50 mg Oral Daily  . lamiVUDine  50 mg Oral Daily  . pantoprazole (PROTONIX) IV  40 mg Intravenous Q12H  . 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 Pateint has agree to starting dialysis and I have called VVS to place permcath and fistula Monday  Bones PTH 94  HTN controlled at present  Anemia ESA started  Depression ongoing  Metabolic acidosis May start bicarbonate  Have initiated CLIP process  EGD today for dysphagia   LOS: 5 Kenneth [name] W @TODAY @9 :46 AM

## 2014-06-27 NOTE — Progress Notes (Signed)
UR completed 

## 2014-06-27 NOTE — Progress Notes (Signed)
ANTICOAGULATION CONSULT NOTE - Follow Up Consult  Pharmacy Consult for heparin Indication: DVT  Allergies  Allergen Reactions  . Amoxicillin     REACTION: diffuse rash    Patient Measurements: Height: 6\' 1"  (185.4 cm) Weight: 173 lb 7 oz (78.67 kg) IBW/kg (Calculated) : 79.9 Heparin Dosing Weight:   Vital Signs: Temp: 97.6 F (36.4 C) (03/26 1350) Temp Source: Oral (03/26 1350) BP: 153/90 mmHg (03/26 1350) Pulse Rate: 67 (03/26 1350)  Labs:  Recent Labs  06/25/14 0745 06/26/14 0758 06/27/14 0530 06/27/14 1819  HGB 8.3* 8.7* 9.0*  --   HCT 25.4* 26.6* 28.0*  --   PLT 242 254 251  --   LABPROT 45.8* 25.6* 20.1*  --   INR 4.87* 2.31* 1.70*  --   HEPARINUNFRC  --   --   --  0.46  CREATININE 9.09* 9.32* 9.51*  --     Estimated Creatinine Clearance: 9.7 mL/min (by C-G formula based on Cr of 9.51).   Medications:  Scheduled:  . abacavir  600 mg Oral Daily  . amLODipine  5 mg Oral Daily  . atorvastatin  40 mg Oral q1800  . dolutegravir  50 mg Oral Daily  . lamiVUDine  50 mg Oral Daily  . pantoprazole (PROTONIX) IV  40 mg Intravenous Q12H  . sodium chloride  3 mL Intravenous Q12H   Infusions:  . heparin 1,100 Units/hr (06/27/14 [id number])    Assessment: 59 yo male with DVT is currently on therapeutic heparin.  Heparin level is 0.46. Goal of Therapy:  Heparin level 0.3-0.7 units/ml Monitor platelets by anticoagulation protocol: Yes   Plan:  - continue heparin at 1100 units/hr - repeat heparin level at midnight to confirm.   Shandiin Eisenbeis, Tsz-Yin 06/27/2014,7:00 PM

## 2014-06-27 NOTE — Progress Notes (Signed)
ANTICOAGULATION CONSULT NOTE - Follow Up Consult  Pharmacy Consult for heparin Indication: DVT  Labs:  Recent Labs  06/25/14 0745 06/26/14 0758 06/27/14 0530  HGB 8.3* 8.7*  --   HCT 25.4* 26.6*  --   PLT 242 254  --   LABPROT 45.8* 25.6* 20.1*  INR 4.87* 2.31* 1.70*  CREATININE 9.09* 9.32* 9.51*    Assessment: 59yo male on Coumadin PTA for DVT now w/ INR <2, to begin heparin.  Goal of Therapy:  Heparin level 0.3-0.7 units/ml   Plan:  Will begin heparin gtt at 1100 units/hr and monitor heparin levels and CBC.  Wynona Neat, PharmD, BCPS  06/27/2014,7:08 AM

## 2014-06-28 DIAGNOSIS — N179 Acute kidney failure, unspecified: Secondary | ICD-10-CM

## 2014-06-28 DIAGNOSIS — N185 Chronic kidney disease, stage 5: Secondary | ICD-10-CM

## 2014-06-28 LAB — HEPARIN LEVEL (UNFRACTIONATED)
HEPARIN UNFRACTIONATED: 0.55 [IU]/mL (ref 0.30–0.70)
Heparin Unfractionated: 0.7 IU/mL (ref 0.30–0.70)
Heparin Unfractionated: 0.68 IU/mL (ref 0.30–0.70)

## 2014-06-28 LAB — CBC
HEMATOCRIT: 23.9 % — AB (ref 39.0–52.0)
Hemoglobin: 7.9 g/dL — ABNORMAL LOW (ref 13.0–17.0)
MCH: 29.9 pg (ref 26.0–34.0)
MCHC: 33.1 g/dL (ref 30.0–36.0)
MCV: 90.5 fL (ref 78.0–100.0)
Platelets: 242 10*3/uL (ref 150–400)
RBC: 2.64 MIL/uL — ABNORMAL LOW (ref 4.22–5.81)
RDW: 14.8 % (ref 11.5–15.5)
WBC: 5.6 10*3/uL (ref 4.0–10.5)

## 2014-06-28 LAB — PROTIME-INR
INR: 1.65 — ABNORMAL HIGH (ref 0.00–1.49)
PROTHROMBIN TIME: 19.6 s — AB (ref 11.6–15.2)

## 2014-06-28 MED ORDER — VANCOMYCIN HCL IN DEXTROSE 1-5 GM/200ML-% IV SOLN
1000.0000 mg | INTRAVENOUS | Status: DC
  Administered 2014-06-29 – 2014-07-03 (×4): 1000 mg via INTRAVENOUS
  Filled 2014-06-28 (×5): qty 200

## 2014-06-28 NOTE — Progress Notes (Addendum)
ANTICOAGULATION CONSULT NOTE - Follow Up Consult  Pharmacy Consult for heparin Indication: DVT  Allergies  Allergen Reactions  . Amoxicillin     REACTION: diffuse rash    Patient Measurements: Height: 6\' 1"  (185.4 cm) Weight: 173 lb 7 oz (78.67 kg) IBW/kg (Calculated) : 79.9 Heparin Dosing Weight: 78.67 kg  Vital Signs: Temp: 98.8 F (37.1 C) (03/26 2152) Temp Source: Oral (03/26 2152) BP: 132/88 mmHg (03/26 2152) Pulse Rate: 71 (03/26 2152)  Labs:  Recent Labs  06/25/14 0745 06/26/14 0758 06/27/14 0530 06/27/14 1819 06/28/14 0047  HGB 8.3* 8.7* 9.0*  --   --   HCT 25.4* 26.6* 28.0*  --   --   PLT 242 254 251  --   --   LABPROT 45.8* 25.6* 20.1*  --   --   INR 4.87* 2.31* 1.70*  --   --   HEPARINUNFRC  --   --   --  0.46 0.55  CREATININE 9.09* 9.32* 9.51*  --   --     Estimated Creatinine Clearance: 9.7 mL/min (by C-G formula based on Cr of 9.51).   Medications:  Scheduled:  . abacavir  600 mg Oral Daily  . amLODipine  5 mg Oral Daily  . atorvastatin  40 mg Oral q1800  . dolutegravir  50 mg Oral Daily  . lamiVUDine  50 mg Oral Daily  . pantoprazole (PROTONIX) IV  40 mg Intravenous Q12H  . sodium chloride  3 mL Intravenous Q12H   Infusions:  . heparin 1,100 Units/hr (06/27/14 [id number])    Assessment: 59 yo male with DVT is currently on therapeutic heparin.  Heparin level is 0.55 on 1100 unit/shr IV heparin.  No bleeding noted.  Goal of Therapy:  Heparin level 0.3-0.7 units/ml Monitor platelets by anticoagulation protocol: Yes   Plan:  Continue heparin at 1100 units/hr heparin level, CBC daily   Nicole Cella, RPh Clinical Pharmacist Pager: [phone] 06/28/2014,2:14 AM

## 2014-06-28 NOTE — Progress Notes (Signed)
[facility] ROUNDING NOTE   Subjective:   Interval History: better and appears to be in better spirits this morning , appears to have achalasia on EGD and swallowing study  Objective:  Vital signs in last 24 hours:  Temp:  [97.5 F (36.4 C)-98.8 F (37.1 C)] 98.4 F (36.9 C) (03/27 0615) Pulse Rate:  [67-76] 73 (03/27 0615) Resp:  [14-16] 16 (03/27 0615) BP: (132-153)/(77-92) 132/77 mmHg (03/27 0615) SpO2:  [98 %-100 %] 98 % (03/27 0615) Weight:  [80.6 kg (177 lb 11.1 oz)] 80.6 kg (177 lb 11.1 oz) (03/27 0610)  Weight change: 1.93 kg (4 lb 4.1 oz) Filed Weights   06/22/14 1700 06/27/14 0533 06/28/14 0610  Weight: 78.744 kg (173 lb 9.6 oz) 78.67 kg (173 lb 7 oz) 80.6 kg (177 lb 11.1 oz)    Intake/Output: I/O last 3 completed shifts: In: 959 [P.O.:693; I.V.:266] Out: 650 [Urine:650]   Intake/Output this shift:  Total I/O In: 742.7 [P.O.:480; I.V.:262.7] Out: -   CVS- RRR RS- CTA ABD- BS present soft non-distended EXT- no edema   Basic Metabolic Panel:  Recent Labs Lab 06/23/14 0645 06/24/14 0637 06/25/14 0745 06/26/14 0758 06/27/14 0530  NA 141 141 140 141 142  K 5.4* 4.9 4.4 4.3 4.4  CL 112 115* 112 113* 114*  CO2 20 22 20  18* 19  GLUCOSE 93 85 92 84 73  BUN 62* 63* 73* 67* 72*  CREATININE 9.13* 9.16* 9.09* 9.32* 9.51*  CALCIUM 7.7* 7.7* 7.7* 7.8* 8.1*    Liver Function Tests:  Recent Labs Lab 06/22/14 1700  AST 16  ALT 11  ALKPHOS 63  BILITOT 0.3  PROT 6.3  ALBUMIN 1.1*   No results for input(s): LIPASE, AMYLASE in the last 168 hours. No results for input(s): AMMONIA in the last 168 hours.  CBC:  Recent Labs Lab 06/22/14 1700  06/24/14 0637 06/25/14 0745 06/26/14 0758 06/27/14 0530 06/28/14 0641  WBC 3.2*  < > 6.4 6.3 5.6 6.5 5.6  NEUTROABS 1.9  --   --   --   --   --   --   HGB 18.2*  < > 9.0* 8.3* 8.7* 9.0* 7.9*  HCT 54.8*  < > 27.5* 25.4* 26.6* 28.0* 23.9*  MCV 90.4  < > 90.2 91.4 90.8 92.4 90.5  PLT 142*  < >  268 242 254 251 242  < > = values in this interval not displayed.  Cardiac Enzymes: No results for input(s): CKTOTAL, CKMB, CKMBINDEX, TROPONINI in the last 168 hours.  BNP: Invalid input(s): POCBNP  CBG: No results for input(s): GLUCAP in the last 168 hours.  Microbiology: Results for orders placed or performed during the hospital encounter of 03/16/14  Clostridium Difficile by PCR     Status: None   Collection Time: 03/16/14 12:02 PM  Result Value Ref Range Status   C difficile by pcr NEGATIVE NEGATIVE Final  Cryptosporidium Smear, Fecal     Status: None   Collection Time: 03/17/14  9:21 PM  Result Value Ref Range Status   Specimen Description STOOL  Final   Special Requests NONE  Final   Cryptosporidium Smear.   Final    NO Cryptosporidium Cyclospora or Isospora seen. Performed at [facility]    Report Status 03/18/2014 FINAL  Final  Ova and parasite examination     Status: None   Collection Time: 03/17/14  9:22 PM  Result Value Ref Range Status   Specimen Description STOOL  Final  Special Requests NONE  Final   Ova and parasites   Final    NO OVA OR PARASITES SEEN Performed at [facility]    Report Status 03/18/2014 FINAL  Final    Coagulation Studies:  Recent Labs  06/26/14 0758 06/27/14 0530 06/28/14 0641  LABPROT 25.6* 20.1* 19.6*  INR 2.31* 1.70* 1.65*    Urinalysis: No results for input(s): COLORURINE, LABSPEC, PHURINE, GLUCOSEU, HGBUR, BILIRUBINUR, KETONESUR, PROTEINUR, UROBILINOGEN, NITRITE, LEUKOCYTESUR in the last 72 hours.  Invalid input(s): APPERANCEUR    Imaging: No results found.   Medications:   . heparin 1,100 Units/hr (06/28/14 0837)   . abacavir  600 mg Oral Daily  . amLODipine  5 mg Oral Daily  . atorvastatin  40 mg Oral q1800  . dolutegravir  50 mg Oral Daily  . lamiVUDine  50 mg Oral Daily  . pantoprazole (PROTONIX) IV  40 mg Intravenous Q12H  . sodium chloride  3 mL Intravenous Q12H  . 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 Pateint has agree to starting dialysis and I have called [facility] to place permcath and fistula Monday  Bones PTH 94  HTN controlled at present  Anemia ESA started  Depression ongoing  Metabolic acidosis May start bicarbonate  Have initiated CLIP process  EGD impacted food and achalasia    LOS: 6 Vrishank Moster W @TODAY @10 :40 AM

## 2014-06-28 NOTE — Progress Notes (Signed)
Family Medicine Teaching Service Daily Progress Note Intern Pager: [phone]  Patient name: Kenneth [name] Medical record number: [id number] Date of birth: Oct 16, 1955 Age: 59 y.o. Gender: male  Primary Care Provider: Cordelia Poche, MD Consultants: ID Code Status: Full  Pt Overview and Major Events to Date:  3/21: Admitted for lab abnormalities and concerning physical exam 3/23: Patient agreeable to HD 3/24: Barium swallow completed 3/25: INR now in therapeutic range; N/V 3/26: EGD demonstrates achalasia vs severe dysmotility  Assessment and Plan: LEMMY [name] is a 59 y.o. male presenting with cough and abdominal distension in clinic, found to have supratherapeutic INR to ~8 . PMH is significant for abdominal pain, anemia, DVT, CKD II, ED, HTN, hemorrhoids, HIV, HLD, lupus, PVCs, protein calorie malnutrition.  ESRD: Most recent creatinine prior to admission 2.77 and GFR 28 03/20/14. However on admission Cr 9.56 and BUN 64. Patient is not making much urine. Progression of renal disease. 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 Patient now agreeable to HD. - Renal US shows R. Surgical nephrectomy. L kidney with medical renal disease. - trend BMP - Cr stable ~9 - nephrology consulted; appreciate recs - now in process of coordinating HD - strict I/Os - Vascular planning on A-V fistula/graft procedure Monday - Will also get permcath for HD while fistula matures  Supratherapeutic INR/Recent DVT: INR in clinic >8. Value later verified at 6.3. On coumadin for DVT in January in [place], no records for verification of details. Some concern in clinic of poor understanding of meds (taking when told to stop taking) / lost to f/u. Reports no current bleeding. Decreasing appropriately.  - Continue to monitor - Pharmacy consulted for supratherapeutic INR. - Hold coumadin - Continue heparin for DVT treatment and ability to easily discontinue for procedures - Up with assistance - trend PT/INR  - INR today 1.65  Anasarca with associated cough: Unclear etiology. No reported dyspnea. Lung exam mild decreased aeration. Reports LE edema is somewhat chronic. Albumin 1.1. BNP 197.  Believe patient to be intracellularly deplete but with fluid overload.  - CT chest with small bilateral effusions and small pericardial effusion - Echo showing grade 1 diastolic dysfunction with normal EF. Pericardial effusion has enlarged.   -With fluid overload dialysis should improve this. No need to consult cardiology at this time - encourage PO hydration - HD to help with fluid status - PT/OT eval - HHPT  Nausea/Vomiting: Recurrent issue for patient. Feels as though food stuck in throat. CT with chronic esophageal dilation with fluid and debris within. Patient with known esophageal dysmotility.  - prn IV Zofran and Phenergan added - IVF prn for hydration if continued n/v - EGD this am demonstrated retained food, suggestive of achalasia vs severe dysmotility - GI following and will review records and consider repeat EGD with botox injection  Depression: Believe patient is suffering from depression but patient declines wanting treatment at this time. This will likely affect health outcomes if not treated appropriately. - Will continue to encourage and support patient.   Anemia: Patient reports previously having blood transfusion about a month ago for anemia. He denies bleeding. Says they did not find a source for low Hbg. Denies any recent colonoscopy. Renal insufficiency likely playing a factor and also anemia of chronic disease. FH of colon cancer. Consider malignancy with stool character change. He has daily BM but it is a very small amount and slightly liquid. Hbg remaining stable baseline appears to be 8-10.  - Hbg stable - FOBT negative -  ESA started 3/23 - consider transfusing if Hbg <7 and/or patient symptomatic  HIV: Unclear what medications he is taking from home med list as patient is unclear  about this in clinic. Previously seen by Dr [name] (ID). States he was taking his HIV medications. Viral load and CD4 count appropriate. No concern at this time for HIV related opportunistic infections.  - ID signed off .> outpatient follow-up arranged.  - continue HAART therapy   HTN: Stable - Continue home norvasc, decr to 5mg  daily due to unsure about his compliance.  Lupus: Recent dx per patient, prednisone listed on med list but he states he is not taking.  - Will not order at this time.   -per nephrology not needed  HLD - Continued home atorvastatin  FEN/GI: Heart Healthy diet; PPI BID Prophylaxis: heparin gtt pending vascular procedures  Disposition: Continue current management; pending further work-up.  Subjective:  No complaints, NAEON. Ate clears this am without any complaints of dysphagia  Objective: Temp:  [97.5 F (36.4 C)-98.8 F (37.1 C)] 98.4 F (36.9 C) (03/27 0615) Pulse Rate:  [66-107] 73 (03/27 0615) Resp:  [9-16] 16 (03/27 0615) BP: (109-164)/(76-116) 132/77 mmHg (03/27 0615) SpO2:  [86 %-100 %] 98 % (03/27 0615) Weight:  [177 lb 11.1 oz (80.6 kg)] 177 lb 11.1 oz (80.6 kg) (03/27 0610) Physical Exam: General: NAD, somber-appearing, awake HEENT: AT/[place], sclera clear, EOMI, o/p clear Cardiovascular: RRR, no m/r/g Respiratory: CTAB, normal effort Abdomen: Soft, nontender, nondistended, no obvious organomegaly, +BS Extremities: 1+ LE edema to knees, no asymmetry or calf tenderness Skin: No rash or cyanosis, no bruising Neuro: Awake, alert, grossly nonfocal, moves all extremities normally   Intake/Output Summary (Last 24 hours) at 06/28/14 0819 Last data filed at 06/28/14 0600  Gross per 24 hour  Intake    832 ml  Output    400 ml  Net    432 ml   Laboratory: Results for orders placed or performed during the hospital encounter of 06/22/14 (from the past 24 hour(s))  Heparin level (unfractionated)     Status: None   Collection Time: 06/27/14  6:19  PM  Result Value Ref Range   Heparin Unfractionated 0.46 0.30 - 0.70 IU/mL  Heparin level (unfractionated)     Status: None   Collection Time: 06/28/14 12:47 AM  Result Value Ref Range   Heparin Unfractionated 0.55 0.30 - 0.70 IU/mL  Heparin level (unfractionated)     Status: None   Collection Time: 06/28/14  6:41 AM  Result Value Ref Range   Heparin Unfractionated 0.70 0.30 - 0.70 IU/mL  CBC     Status: Abnormal   Collection Time: 06/28/14  6:41 AM  Result Value Ref Range   WBC 5.6 4.0 - 10.5 K/uL   RBC 2.64 (L) 4.22 - 5.81 MIL/uL   Hemoglobin 7.9 (L) 13.0 - 17.0 g/dL   HCT 23.9 (L) 39.0 - 52.0 %   MCV 90.5 78.0 - 100.0 fL   MCH 29.9 26.0 - 34.0 pg   MCHC 33.1 30.0 - 36.0 g/dL   RDW 14.8 11.5 - 15.5 %   Platelets 242 150 - 400 K/uL  Protime-INR     Status: Abnormal   Collection Time: 06/28/14  6:41 AM  Result Value Ref Range   Prothrombin Time 19.6 (H) 11.6 - 15.2 seconds   INR 1.65 (H) 0.00 - 1.49    Imaging/Diagnostic Tests: Ct Chest and Abdomen Pelvis Wo Contrast 3/21:  CT CHEST IMPRESSION  1. Moderate degradation, secondary  to lack of contrast, paucity of fat, and extent of anasarca. 2. Small bilateral pleural effusions with adjacent subsegmental atelectasis. 3. New pericardial effusion, small. 4. Chronic esophageal dilatation with fluid and debris within. This suggests a component of obstruction at the gastroesophageal junction and/or esophageal dysmotility.   CT ABDOMEN AND PELVIS IMPRESSION  1. Development of moderate abdominal pelvic ascites. 2. Apparent irregular hepatic capsule which could be CT artifactual. Interval development of cirrhosis since 03/16/2014 is felt unlikely. Correlate with risk factors. 3. Cholelithiasis. 4. Anasarca.    Renal ultrasound 3/22: 1. Right nephrectomy and chronic left medical renal disease. 2. No hydronephrosis. 3. Known ascites.  Barium swallow 3/24: 1. Stable exam since 2007. 2. Esophageal dysmotility and dilation, but no akinesia  typical of achalasia. Non-fixed narrowing at the GE junction, at least partly related to remote anti reflux surgery.  EGD 3/26: Retained food in esophagus, relieved endoscopically, suspect achalasia or severe motility disorder  Frazier Richards, MD 06/28/2014, 8:19 AM PGY-2, [facility] Intern pager: [phone], text pages welcome

## 2014-06-28 NOTE — Progress Notes (Addendum)
ANTICOAGULATION CONSULT NOTE - Follow Up Consult  Pharmacy Consult for heparin Indication: DVT  Allergies  Allergen Reactions  . Amoxicillin     REACTION: diffuse rash    Patient Measurements: Height: 6\' 1"  (185.4 cm) Weight: 177 lb 11.1 oz (80.6 kg) IBW/kg (Calculated) : 79.9 Heparin Dosing Weight:   Vital Signs: Temp: 98.4 F (36.9 C) (03/27 0615) Temp Source: Oral (03/27 0615) BP: 132/77 mmHg (03/27 0615) Pulse Rate: 73 (03/27 0615)  Labs:  Recent Labs  06/26/14 0758 06/27/14 0530 06/27/14 1819 06/28/14 0047 06/28/14 0641  HGB 8.7* 9.0*  --   --  7.9*  HCT 26.6* 28.0*  --   --  23.9*  PLT 254 251  --   --  242  LABPROT 25.6* 20.1*  --   --  19.6*  INR 2.31* 1.70*  --   --  1.65*  HEPARINUNFRC  --   --  0.46 0.55 0.70  CREATININE 9.32* 9.51*  --   --   --     Estimated Creatinine Clearance: 9.8 mL/min (by C-G formula based on Cr of 9.51).   Medications:  Scheduled:  . abacavir  600 mg Oral Daily  . amLODipine  5 mg Oral Daily  . atorvastatin  40 mg Oral q1800  . dolutegravir  50 mg Oral Daily  . lamiVUDine  50 mg Oral Daily  . pantoprazole (PROTONIX) IV  40 mg Intravenous Q12H  . sodium chloride  3 mL Intravenous Q12H  . [START ON 06/29/2014] vancomycin  1,000 mg Intravenous On Call to OR   Infusions:  . heparin 1,100 Units/hr (06/28/14 [id number])    Assessment: 59 yo male with DVT is currently on therapeutic heparin.  Heparin level is up to 0.7.  Repeat HL 0.68 Goal of Therapy:  Heparin level 0.3-0.7 units/ml Monitor platelets by anticoagulation protocol: Yes   Plan:  continue heparin at 1100 units/hr.  Keiandra Sullenger, Tsz-Yin 06/28/2014,10:31 AM

## 2014-06-28 NOTE — Progress Notes (Signed)
[facility] Progress Note  Subjective: No further subjective feeling of obstructive esophagus after EGD with removal of food yesterday  Objective: Vital signs in last 24 hours: Temp:  [97.5 F (36.4 C)-98.8 F (37.1 C)] 98.4 F (36.9 C) (03/27 0615) Pulse Rate:  [67-107] 73 (03/27 0615) Resp:  [9-16] 16 (03/27 0615) BP: (109-163)/(76-111) 132/77 mmHg (03/27 0615) SpO2:  [86 %-100 %] 98 % (03/27 0615) Weight:  [80.6 kg (177 lb 11.1 oz)] 80.6 kg (177 lb 11.1 oz) (03/27 0610) Weight change: 1.93 kg (4 lb 4.1 oz)   PE: Unchanged  Lab Results: Results for orders placed or performed during the hospital encounter of 06/22/14 (from the past 24 hour(s))  Heparin level (unfractionated)     Status: None   Collection Time: 06/27/14  6:19 PM  Result Value Ref Range   Heparin Unfractionated 0.46 0.30 - 0.70 IU/mL  Heparin level (unfractionated)     Status: None   Collection Time: 06/28/14 12:47 AM  Result Value Ref Range   Heparin Unfractionated 0.55 0.30 - 0.70 IU/mL  Heparin level (unfractionated)     Status: None   Collection Time: 06/28/14  6:41 AM  Result Value Ref Range   Heparin Unfractionated 0.70 0.30 - 0.70 IU/mL  CBC     Status: Abnormal   Collection Time: 06/28/14  6:41 AM  Result Value Ref Range   WBC 5.6 4.0 - 10.5 K/uL   RBC 2.64 (L) 4.22 - 5.81 MIL/uL   Hemoglobin 7.9 (L) 13.0 - 17.0 g/dL   HCT 23.9 (L) 39.0 - 52.0 %   MCV 90.5 78.0 - 100.0 fL   MCH 29.9 26.0 - 34.0 pg   MCHC 33.1 30.0 - 36.0 g/dL   RDW 14.8 11.5 - 15.5 %   Platelets 242 150 - 400 K/uL  Protime-INR     Status: Abnormal   Collection Time: 06/28/14  6:41 AM  Result Value Ref Range   Prothrombin Time 19.6 (H) 11.6 - 15.2 seconds   INR 1.65 (H) 0.00 - 1.49    Studies/Results: No results found.    Assessment: Dysphagia, likely achalasia with history of Heller myotomy in the 90s  Plan: For now we will simply advance to full liquid diet. If develops obstructive symptoms again,  consider Botox injection of the lower esophageal sphincter.    [id number] C 06/28/2014, 9:50 AM

## 2014-06-28 NOTE — Progress Notes (Signed)
Patient ID: Kenneth [name], male   DOB: 12/24/1955, 59 y.o.   MRN: [id number] Plan vascular access left upper extremity either basilic vein transposition or insertion left arm AV graft plus temporary tunneled dialysis catheter by Dr. early in a.m. Discussed with patient and he is agreeable to proceed

## 2014-06-29 ENCOUNTER — Inpatient Hospital Stay (HOSPITAL_COMMUNITY): Payer: BC Managed Care – PPO

## 2014-06-29 ENCOUNTER — Inpatient Hospital Stay (HOSPITAL_COMMUNITY): Payer: BC Managed Care – PPO | Admitting: Certified Registered Nurse Anesthetist

## 2014-06-29 ENCOUNTER — Encounter (HOSPITAL_COMMUNITY)
Admission: AD | Disposition: A | Discharge status: Home Health | Payer: Self-pay | Source: Ambulatory Visit | Attending: Family Medicine

## 2014-06-29 ENCOUNTER — Encounter (HOSPITAL_COMMUNITY): Payer: Self-pay | Admitting: Gastroenterology

## 2014-06-29 HISTORY — PX: BASCILIC VEIN TRANSPOSITION: SHX5742

## 2014-06-29 HISTORY — PX: INSERTION OF DIALYSIS CATHETER: SHX1324

## 2014-06-29 LAB — CBC
HEMATOCRIT: 22.8 % — AB (ref 39.0–52.0)
HEMOGLOBIN: 7.4 g/dL — AB (ref 13.0–17.0)
MCH: 30 pg (ref 26.0–34.0)
MCHC: 32.5 g/dL (ref 30.0–36.0)
MCV: 92.3 fL (ref 78.0–100.0)
PLATELETS: 212 10*3/uL (ref 150–400)
RBC: 2.47 MIL/uL — ABNORMAL LOW (ref 4.22–5.81)
RDW: 15.1 % (ref 11.5–15.5)
WBC: 5.5 10*3/uL (ref 4.0–10.5)

## 2014-06-29 LAB — HEPARIN LEVEL (UNFRACTIONATED): HEPARIN UNFRACTIONATED: 0.46 [IU]/mL (ref 0.30–0.70)

## 2014-06-29 LAB — PROTIME-INR
INR: 1.84 — ABNORMAL HIGH (ref 0.00–1.49)
Prothrombin Time: 21.4 seconds — ABNORMAL HIGH (ref 11.6–15.2)

## 2014-06-29 LAB — BASIC METABOLIC PANEL
ANION GAP: 7 (ref 5–15)
BUN: 68 mg/dL — ABNORMAL HIGH (ref 6–23)
CALCIUM: 7.4 mg/dL — AB (ref 8.4–10.5)
CO2: 17 mmol/L — ABNORMAL LOW (ref 19–32)
Chloride: 116 mmol/L — ABNORMAL HIGH (ref 96–112)
Creatinine, Ser: 9.19 mg/dL — ABNORMAL HIGH (ref 0.50–1.35)
GFR calc non Af Amer: 6 mL/min — ABNORMAL LOW (ref >90)
GFR, EST AFRICAN AMERICAN: 7 mL/min — AB (ref >90)
GLUCOSE: 86 mg/dL (ref 70–99)
Potassium: 4.4 mmol/L (ref 3.5–5.1)
SODIUM: 140 mmol/L (ref 135–145)

## 2014-06-29 LAB — SURGICAL PCR SCREEN
MRSA, PCR: NEGATIVE
STAPHYLOCOCCUS AUREUS: NEGATIVE

## 2014-06-29 SURGERY — TRANSPOSITION, VEIN, BASILIC
Anesthesia: General | Site: Chest | Laterality: Right

## 2014-06-29 MED ORDER — PHENYLEPHRINE HCL 10 MG/ML IJ SOLN
10.0000 mg | INTRAVENOUS | Status: DC | PRN
  Administered 2014-06-29: 25 ug/min via INTRAVENOUS

## 2014-06-29 MED ORDER — ONDANSETRON HCL 4 MG/2ML IJ SOLN
INTRAMUSCULAR | Status: AC
  Filled 2014-06-29: qty 2

## 2014-06-29 MED ORDER — PROPOFOL 10 MG/ML IV BOLUS
INTRAVENOUS | Status: AC
Start: 2014-06-29 — End: 2014-06-29
  Filled 2014-06-29: qty 20

## 2014-06-29 MED ORDER — LIDOCAINE-EPINEPHRINE 0.5 %-1:200000 IJ SOLN
INTRAMUSCULAR | Status: AC
  Filled 2014-06-29: qty 1

## 2014-06-29 MED ORDER — AMLODIPINE BESYLATE 5 MG PO TABS
5.0000 mg | ORAL_TABLET | Freq: Every day | ORAL | Status: DC
  Administered 2014-06-30 – 2014-07-01 (×2): 5 mg via ORAL
  Filled 2014-06-29 (×3): qty 1

## 2014-06-29 MED ORDER — HEPARIN (PORCINE) IN NACL 100-0.45 UNIT/ML-% IJ SOLN
1100.0000 [IU]/h | INTRAMUSCULAR | Status: DC
  Administered 2014-06-29: 1100 [IU]/h via INTRAVENOUS
  Filled 2014-06-29 (×2): qty 250

## 2014-06-29 MED ORDER — EPHEDRINE SULFATE 50 MG/ML IJ SOLN
INTRAMUSCULAR | Status: DC | PRN
  Administered 2014-06-29 (×2): 5 mg via INTRAVENOUS

## 2014-06-29 MED ORDER — SODIUM CHLORIDE 0.9 % IR SOLN
Status: DC | PRN
  Administered 2014-06-29: 500 mL

## 2014-06-29 MED ORDER — MIDAZOLAM HCL 2 MG/2ML IJ SOLN
INTRAMUSCULAR | Status: AC
  Filled 2014-06-29: qty 2

## 2014-06-29 MED ORDER — HYDROMORPHONE HCL 1 MG/ML IJ SOLN: 0.2500 mg | INTRAMUSCULAR | Status: DC | PRN

## 2014-06-29 MED ORDER — PHENYLEPHRINE HCL 10 MG/ML IJ SOLN
INTRAMUSCULAR | Status: DC | PRN
  Administered 2014-06-29 (×4): 80 ug via INTRAVENOUS

## 2014-06-29 MED ORDER — HEPARIN SODIUM (PORCINE) 1000 UNIT/ML IJ SOLN
INTRAMUSCULAR | Status: DC | PRN
  Administered 2014-06-29: 4.6 mL

## 2014-06-29 MED ORDER — PROPOFOL 10 MG/ML IV BOLUS
INTRAVENOUS | Status: DC | PRN
  Administered 2014-06-29: 140 mg via INTRAVENOUS

## 2014-06-29 MED ORDER — ONDANSETRON HCL 4 MG/2ML IJ SOLN
INTRAMUSCULAR | Status: DC | PRN
  Administered 2014-06-29: 4 mg via INTRAVENOUS

## 2014-06-29 MED ORDER — RENA-VITE PO TABS
1.0000 | ORAL_TABLET | Freq: Every day | ORAL | Status: DC
  Administered 2014-06-29 – 2014-07-06 (×8): 1 via ORAL
  Filled 2014-06-29 (×9): qty 1

## 2014-06-29 MED ORDER — 0.9 % SODIUM CHLORIDE (POUR BTL) OPTIME
TOPICAL | Status: DC | PRN
Start: 2014-06-29 — End: 2014-06-29
  Administered 2014-06-29: 1000 mL

## 2014-06-29 MED ORDER — FENTANYL CITRATE 0.05 MG/ML IJ SOLN
INTRAMUSCULAR | Status: AC
  Filled 2014-06-29: qty 5

## 2014-06-29 MED ORDER — SODIUM CHLORIDE 0.9 % IV SOLN
INTRAVENOUS | Status: DC
  Administered 2014-06-29: 12:00:00 via INTRAVENOUS

## 2014-06-29 MED ORDER — DARBEPOETIN ALFA 100 MCG/0.5ML IJ SOSY
100.0000 ug | PREFILLED_SYRINGE | INTRAMUSCULAR | Status: DC
  Filled 2014-06-29: qty 0.5

## 2014-06-29 MED ORDER — LIDOCAINE HCL (CARDIAC) 20 MG/ML IV SOLN
INTRAVENOUS | Status: DC | PRN
  Administered 2014-06-29: 50 mg via INTRAVENOUS

## 2014-06-29 MED ORDER — FENTANYL CITRATE 0.05 MG/ML IJ SOLN
INTRAMUSCULAR | Status: DC | PRN
  Administered 2014-06-29 (×3): 25 ug via INTRAVENOUS
  Administered 2014-06-29: 100 ug via INTRAVENOUS

## 2014-06-29 MED ORDER — MEPERIDINE HCL 25 MG/ML IJ SOLN: 6.2500 mg | INTRAMUSCULAR | Status: DC | PRN

## 2014-06-29 MED ORDER — HEPARIN SODIUM (PORCINE) 1000 UNIT/ML IJ SOLN
INTRAMUSCULAR | Status: AC
  Filled 2014-06-29: qty 1

## 2014-06-29 MED ORDER — SUCCINYLCHOLINE CHLORIDE 20 MG/ML IJ SOLN
INTRAMUSCULAR | Status: DC | PRN
  Administered 2014-06-29: 100 mg via INTRAVENOUS

## 2014-06-29 MED ORDER — PROMETHAZINE HCL 25 MG/ML IJ SOLN: 6.2500 mg | INTRAMUSCULAR | Status: DC | PRN

## 2014-06-29 SURGICAL SUPPLY — 61 items
ARMBAND PINK RESTRICT EXTREMIT (MISCELLANEOUS) ×4 IMPLANT
BAG DECANTER FOR FLEXI CONT (MISCELLANEOUS) ×4 IMPLANT
BENZOIN TINCTURE PRP APPL 2/3 (GAUZE/BANDAGES/DRESSINGS) ×4 IMPLANT
BIOPATCH RED 1 DISK 7.0 (GAUZE/BANDAGES/DRESSINGS) ×4 IMPLANT
CANISTER SUCTION 2500CC (MISCELLANEOUS) ×4 IMPLANT
CANNULA VESSEL 3MM 2 BLNT TIP (CANNULA) IMPLANT
CATH CANNON HEMO 15F 50CM (CATHETERS) IMPLANT
CATH CANNON HEMO 15FR 19 (HEMODIALYSIS SUPPLIES) IMPLANT
CATH CANNON HEMO 15FR 23CM (HEMODIALYSIS SUPPLIES) ×4 IMPLANT
CATH CANNON HEMO 15FR 31CM (HEMODIALYSIS SUPPLIES) IMPLANT
CATH CANNON HEMO 15FR 32CM (HEMODIALYSIS SUPPLIES) IMPLANT
CLIP LIGATING EXTRA MED SLVR (CLIP) ×4 IMPLANT
CLIP LIGATING EXTRA SM BLUE (MISCELLANEOUS) ×4 IMPLANT
CLSR STERI-STRIP ANTIMIC 1/2X4 (GAUZE/BANDAGES/DRESSINGS) ×4 IMPLANT
COVER PROBE W GEL 5X96 (DRAPES) ×4 IMPLANT
DECANTER SPIKE VIAL GLASS SM (MISCELLANEOUS) ×4 IMPLANT
DRAPE C-ARM 42X72 X-RAY (DRAPES) IMPLANT
DRAPE CHEST BREAST 15X10 FENES (DRAPES) ×4 IMPLANT
ELECT REM PT RETURN 9FT ADLT (ELECTROSURGICAL) ×4
ELECTRODE REM PT RTRN 9FT ADLT (ELECTROSURGICAL) ×3 IMPLANT
GAUZE SPONGE 2X2 8PLY STRL LF (GAUZE/BANDAGES/DRESSINGS) ×3 IMPLANT
GAUZE SPONGE 4X4 12PLY STRL (GAUZE/BANDAGES/DRESSINGS) ×4 IMPLANT
GAUZE SPONGE 4X4 16PLY XRAY LF (GAUZE/BANDAGES/DRESSINGS) ×4 IMPLANT
GEL ULTRASOUND 20GR AQUASONIC (MISCELLANEOUS) IMPLANT
GLOVE BIO SURGEON STRL SZ 6.5 (GLOVE) ×4 IMPLANT
GLOVE BIOGEL PI IND STRL 6.5 (GLOVE) ×15 IMPLANT
GLOVE BIOGEL PI IND STRL 7.0 (GLOVE) ×9 IMPLANT
GLOVE BIOGEL PI INDICATOR 6.5 (GLOVE) ×5
GLOVE BIOGEL PI INDICATOR 7.0 (GLOVE) ×3
GLOVE ECLIPSE 6.5 STRL STRAW (GLOVE) ×8 IMPLANT
GLOVE SS BIOGEL STRL SZ 7.5 (GLOVE) ×6 IMPLANT
GLOVE SUPERSENSE BIOGEL SZ 7.5 (GLOVE) ×2
GOWN STRL REUS W/ TWL LRG LVL3 (GOWN DISPOSABLE) ×15 IMPLANT
GOWN STRL REUS W/TWL LRG LVL3 (GOWN DISPOSABLE) ×5
KIT BASIN OR (CUSTOM PROCEDURE TRAY) ×4 IMPLANT
KIT ROOM TURNOVER OR (KITS) ×4 IMPLANT
NEEDLE 18GX1X1/2 (RX/OR ONLY) (NEEDLE) ×4 IMPLANT
NEEDLE 22X1 1/2 (OR ONLY) (NEEDLE) IMPLANT
NEEDLE HYPO 25GX1X1/2 BEV (NEEDLE) IMPLANT
NS IRRIG 1000ML POUR BTL (IV SOLUTION) ×4 IMPLANT
PACK CV ACCESS (CUSTOM PROCEDURE TRAY) ×4 IMPLANT
PACK SURGICAL SETUP 50X90 (CUSTOM PROCEDURE TRAY) IMPLANT
PAD ARMBOARD 7.5X6 YLW CONV (MISCELLANEOUS) ×8 IMPLANT
SOAP 2 % CHG 4 OZ (WOUND CARE) ×4 IMPLANT
SPONGE GAUZE 2X2 STER 10/PKG (GAUZE/BANDAGES/DRESSINGS) ×1
SPONGE GAUZE 4X4 12PLY STER LF (GAUZE/BANDAGES/DRESSINGS) ×4 IMPLANT
STRIP CLOSURE SKIN 1/2X4 (GAUZE/BANDAGES/DRESSINGS) ×4 IMPLANT
SUT ETHILON 3 0 PS 1 (SUTURE) ×4 IMPLANT
SUT PROLENE 6 0 CC (SUTURE) ×8 IMPLANT
SUT SILK 2 0 FS (SUTURE) IMPLANT
SUT SILK 2 0 SH (SUTURE) IMPLANT
SUT VIC AB 3-0 SH 27 (SUTURE) ×2
SUT VIC AB 3-0 SH 27X BRD (SUTURE) ×6 IMPLANT
SUT VICRYL 4-0 PS2 18IN ABS (SUTURE) ×4 IMPLANT
SYR 20CC LL (SYRINGE) ×4 IMPLANT
SYR 5ML LL (SYRINGE) IMPLANT
SYR CONTROL 10ML LL (SYRINGE) IMPLANT
SYRINGE 10CC LL (SYRINGE) ×4 IMPLANT
TAPE CLOTH SURG 4X10 WHT LF (GAUZE/BANDAGES/DRESSINGS) ×8 IMPLANT
UNDERPAD 30X30 INCONTINENT (UNDERPADS AND DIAPERS) ×4 IMPLANT
WATER STERILE IRR 1000ML POUR (IV SOLUTION) ×4 IMPLANT

## 2014-06-29 NOTE — Interval H&P Note (Signed)
History and Physical Interval Note:  06/29/2014 11:42 AM  Kenneth [name]  has presented today for surgery, with the diagnosis of DVT  The various methods of treatment have been discussed with the patient and family. After consideration of risks, benefits and other options for treatment, the patient has consented to  Procedure(s): INSERTION OF ARTERIOVENOUS (AV) GORE-TEX GRAFT ARM (Left) BASCILIC VEIN TRANSPOSITION (Left) INSERTION OF DIALYSIS CATHETER (Left) as a surgical intervention .  The patient's history has been reviewed, patient examined, no change in status, stable for surgery.  I have reviewed the patient's chart and labs.  Questions were answered to the patient's satisfaction.     EARLY, TODD

## 2014-06-29 NOTE — Progress Notes (Signed)
ANTICOAGULATION CONSULT NOTE - Follow Up Consult  Pharmacy Consult for Heparin Indication: DVT  Allergies  Allergen Reactions  . Amoxicillin     REACTION: diffuse rash    Patient Measurements: Height: 6\' 1"  (185.4 cm) Weight: 177 lb 12.8 oz (80.65 kg) IBW/kg (Calculated) : 79.9 Heparin Dosing Weight: 80.7 kg  Vital Signs: Temp: 98.4 F (36.9 C) (03/28 0513) Temp Source: Oral (03/28 0513) BP: 128/71 mmHg (03/28 0513) Pulse Rate: 71 (03/28 0513)  Labs:  Recent Labs  06/27/14 0530  06/28/14 0641 06/28/14 1145 06/29/14 0908  HGB 9.0*  --  7.9*  --  7.4*  HCT 28.0*  --  23.9*  --  22.8*  PLT 251  --  242  --  212  LABPROT 20.1*  --  19.6*  --  21.4*  INR 1.70*  --  1.65*  --  1.84*  HEPARINUNFRC  --   < > 0.70 0.68 0.46  CREATININE 9.51*  --   --   --  9.19*  < > = values in this interval not displayed.  Estimated Creatinine Clearance: 10.1 mL/min (by C-G formula based on Cr of 9.19).  Assessment: CC: supratherapeutic INR 4.87 (on fluconazole)  PMH: HIV, HLD, HTN, CKD, SLE, pancytopenia, DVT  AC: Heparin for hx DVT in January, on coumadin PTA; INR 1.65 < 1.7 << 2.31 from 4.87 after some FFP and Vit K 5mg  po on 3/24, Hgb 7.4 down and Plt 212 - hx compliance issues, fluconazole stopped.Hold coumadin for perm cath and AV fistula placement>>heparin. Heparin level 0.46. INR 1.84 (up) -PTA coumadin. 5 mg TTSSu and 2.5 mg other days  ID: hx HIV - Tmax 99.2, WBC WNL - abacavir, dolutegravir, fluconazole (PTA), lamivudine - ID following - CD4 = 370  CV: Hx HTN, HLD: VSS this AM on amlodipine5, lipitor40  Endo: AM gluc ok  GI/Nutr: NPO - PPI, LFTs WNL. S/p EGD due to obstruction sensation.  Now feeling better; will advance to full liquid diet. Achalasia requiring dilation the past.  Neuro: WDL  Renal: Hx CKD - Scr 9.19 - pt resisting HD.Will start HD using perm cath (will not put in until INR <1.5); surgery will put in AV fistula also on Monday  Pulm: RA  Heme/Onc:  Hx SLE - See [facility]  Best practices: warfarin  Goal of Therapy:  Heparin level 0.3-0.7 units/ml  INR 2-3 Monitor platelets by anticoagulation protocol: Yes   Plan:  - continue heparin at 1100 units/hr. - f/u after perm cath/ AV fistula placement on Monday to see if wants to restart coumadin  Kenneth [name], PharmD, BCPS Clinical Staff Pharmacist Pager [phone]   Eilene Ghazi Stillinger 06/29/2014,10:19 AM

## 2014-06-29 NOTE — Progress Notes (Signed)
ANTICOAGULATION CONSULT NOTE - Follow Up Consult  Pharmacy Consult for Heparin Indication: DVT  Allergies  Allergen Reactions  . Amoxicillin     REACTION: diffuse rash    Patient Measurements: Height: 6\' 1"  (185.4 cm) Weight: 177 lb 12.8 oz (80.65 kg) IBW/kg (Calculated) : 79.9 Heparin Dosing Weight: 80.7 kg  Vital Signs: Temp: 97.9 F (36.6 C) (03/28 1545) Temp Source: Oral (03/28 0513) BP: 110/68 mmHg (03/28 1545) Pulse Rate: 82 (03/28 1545)  Labs:  Recent Labs  06/27/14 0530  06/28/14 0641 06/28/14 1145 06/29/14 0908  HGB 9.0*  --  7.9*  --  7.4*  HCT 28.0*  --  23.9*  --  22.8*  PLT 251  --  242  --  212  LABPROT 20.1*  --  19.6*  --  21.4*  INR 1.70*  --  1.65*  --  1.84*  HEPARINUNFRC  --   < > 0.70 0.68 0.46  CREATININE 9.51*  --   --   --  9.19*  < > = values in this interval not displayed.  Estimated Creatinine Clearance: 10.1 mL/min (by C-G formula based on Cr of 9.19).  Assessment: Pt on coumadin PTA for hx DVT in January. Coumadin being held for perm cath and fistula placement - bridging with heparin. Pt s/p fistula placement today. MD order to restart heparin tonight with no bolus at 1930 at previous rate (1100 units/hr). Pt has been therapeutic on this rate.   Goal of Therapy:  Heparin level 0.3-0.7 units/ml  Monitor platelets by anticoagulation protocol: Yes   Plan:  Restart heparin at 1100 units/hr at [place] (per MD order). Will recheck heparin level at 0200 (~6 hours post heparin restart)   Sherlon Handing, PharmD, BCPS Clinical pharmacist, pager [phone] 06/29/2014,4:32 PM

## 2014-06-29 NOTE — Op Note (Signed)
    OPERATIVE REPORT  DATE OF SURGERY: 06/29/2014  PATIENT: Kenneth [name], 59 y.o. male MRN: [id number]  DOB: October 28, 1955  PRE-OPERATIVE DIAGNOSIS: End-stage renal disease  POST-OPERATIVE DIAGNOSIS:  Same  PROCEDURE: #1 right IJ catheter insertion with SonoSite visualization, #2 left basilic vein stage I fistula  SURGEON:  Curt Jews, M.D.  PHYSICIAN ASSISTANT: Nurse  ANESTHESIA:  Gen.  EBL: 50 ml  Total I/O In: 350 [I.V.:350] Out: 300 [Urine:250; Blood:50]  BLOOD ADMINISTERED: None  DRAINS: None  SPECIMEN: None  COUNTS CORRECT:  YES  PLAN OF CARE: PACU stable with chest x-ray pending   PATIENT DISPOSITION:  PACU - hemodynamically stable  PROCEDURE DETAILS: Patient was taken to the operative placed supine position where the area of the right neck was imaged with SonoSite revealing widely patent jugular vein. The patient was placed in Trendelenburg position and using  an 18-gauge needle the internal jugular vein was accessed. A guidewire was passed through the 18-gauge needle to the level the right atrium and this was confirmed with fluoroscopy. A dilator peel-away sheath was passed over the guidewire and the dilator and guidewire removed. A 23 cm tunneled catheter was positioned with tips in the level the distal right atrium. Catheter was brought through subcutaneous tunnel through a separate stab incision in the infraclavicular area. The 2 lm ports were attached in both lumens flushed and aspirated easily and were locked with 1000 unit per cc the catheter was secured to the skin with 3-0 nylon stitch and the entry site was closed with a 4 subcuticular Vicryl stitch and nylon stitch.  Attention was then turned to the left arm. SonoSite was used to visualize the veins. The patient did have a visible vein on the forearm. This was somewhat more only lateral and medial aspect. Incision was made and this vein was mobilized. Tributary branches were ligated and the vein was ligated  gently dilated. Was felt this was an adequate for fistula creation. For that reason separate incision was made over the basilic vein medial to the antecubital space. The vein was of good quality. The vein had several branches at the level of the antecubital space. The more medial of these was chosen. Tributary branches were ligated brought into approximation of the brachial artery with the same incision. The artery was of good caliber. The artery was occluded proximally and distally. The branch of the basilic vein was ligated and divided and was cut to appropriate length and spatulated and sewn end-to-side to the artery with a running 6-0 Prolene suture. Clamps removed and excellent thrill was noted through the vein. Wounds irrigated with saline. Hemostasis tablet cautery. Wounds were closed with 3-0 Vicryl the subcutaneous and subcuticular tissue. Benzoin insertion for applied answered to the recovery room where chest x-ray is pending    Curt Jews, M.D. 06/29/2014 2:53 PM

## 2014-06-29 NOTE — Anesthesia Postprocedure Evaluation (Signed)
Anesthesia Post Note  Patient: Kenneth [name]  Procedure(s) Performed: Procedure(s) (LRB): FIRST STAGE  BASCILIC VEIN TRANSPOSITION (Left) INSERTION OF DIALYSIS CATHETER RIGHT IJ (Right)  Anesthesia type: General  Patient location: PACU  Post pain: Pain level controlled  Post assessment: Post-op Vital signs reviewed  Last Vitals: BP 110/68 mmHg  Pulse 82  Temp(Src) 36.6 C (Oral)  Resp 17  Ht 6\' 1"  (1.854 m)  Wt 177 lb 12.8 oz (80.65 kg)  BMI 23.46 kg/m2  SpO2 98%  Post vital signs: Reviewed  Level of consciousness: sedated  Complications: No apparent anesthesia complications

## 2014-06-29 NOTE — Progress Notes (Signed)
[facility] Rounding Note Subjective:  Has just returned from access procedures Sleepy (and a somewhat difficult historian) Says neck and arm a little sore, left hand feels all right Denies abdominal pain  Objective Vital signs in last 24 hours: Filed Vitals:   06/29/14 0513 06/29/14 1500 06/29/14 1515 06/29/14 1530  BP: 128/71 127/78 118/77 121/79  Pulse: 71 69 68 78  Temp: 98.4 F (36.9 C) 97 F (36.1 C)    TempSrc: Oral     Resp: 18 12 15 14   Height:      Weight: 80.65 kg (177 lb 12.8 oz)     SpO2: 98% 100% 100% 99%   Weight change: 0.05 kg (1.8 oz)  Intake/Output Summary (Last 24 hours) at 06/29/14 1543 Last data filed at 06/29/14 1434  Gross per 24 hour  Intake 1107.77 ml  Output   1000 ml  Net 107.77 ml   Physical Exam:  BP 121/79 mmHg  Pulse 78  Temp(Src) 97 F (36.1 C) (Oral)  Resp 14  Ht 6\' 1"  (1.854 m)  Wt 80.65 kg (177 lb 12.8 oz)  BMI 23.46 kg/m2  SpO2 99%  No JVD Lungs grossly clear  Heart sounds distant S1S2 No S3 and no pericardial rub Abd soft, no focal tenderness at this time Trace edema LE's Left upper arm AVF with + bruit (though faint to my ear)   Labs:  Recent Labs Lab 06/22/14 2006 06/23/14 0645 06/24/14 [id number] 06/25/14 0745 06/26/14 0758 06/27/14 0530 06/29/14 0908  NA 140 141 141 140 141 142 140  K 5.5* 5.4* 4.9 4.4 4.3 4.4 4.4  CL 113* 112 115* 112 113* 114* 116*  CO2 17* 20 22 20  18* 19 17*  GLUCOSE 107* 93 85 92 84 73 86  BUN 65* 62* 63* 73* 67* 72* 68*  CREATININE 9.12* 9.13* 9.16* 9.09* 9.32* 9.51* 9.19*  CALCIUM 7.8* 7.7* 7.7* 7.7* 7.8* 8.1* 7.4*     Recent Labs Lab 06/22/14 1700  AST 16  ALT 11  ALKPHOS 63  BILITOT 0.3  PROT 6.3  ALBUMIN 1.1*    Recent Labs Lab 06/22/14 1700  06/26/14 0758 06/27/14 0530 06/28/14 0641 06/29/14 0908  WBC 3.2*  < > 5.6 6.5 5.6 5.5  NEUTROABS 1.9  --   --   --   --   --   HGB 18.2*  < > 8.7* 9.0* 7.9* 7.4*  HCT 54.8*  < > 26.6* 28.0* 23.9* 22.8*  MCV  90.4  < > 90.8 92.4 90.5 92.3  PLT 142*  < > 254 251 242 212  < > = values in this interval not displayed.  Iron Studies:  Recent Labs Lab 06/23/14 1202  IRON 37*  TIBC 91*   Lab Results  Component Value Date   INR 1.84* 06/29/2014   INR 1.65* 06/28/2014   INR 1.70* 06/27/2014   Studies/Results: Dg Chest Port 1 View  06/29/2014   CLINICAL DATA:  Postop film 4 diet diatek catheter insertion.  EXAM: PORTABLE CHEST - 1 VIEW  COMPARISON:  02/09/2014  FINDINGS: New right internal jugular dual-lumen central venous line has its distal port projecting in the right atrium.  No pneumothorax.  Moderate left and small right effusions. There is centrally predominant interstitial thickening consistent with mild edema.  Cardiac silhouette is mildly enlarged. No mediastinal or hilar masses.  IMPRESSION: 1. Dual-lumen tunneled right internal jugular central venous catheter has its distal catheter tip projecting in the right atrium. No pneumothorax. 2. Mild central  interstitial pulmonary edema and moderate left and small right effusions.   Electronically Signed   By: Lajean Manes M.D.   On: 06/29/2014 15:13   Medications: . sodium chloride 10 mL/hr at 06/29/14 1137  . heparin 1,100 Units/hr (06/28/14 0837)   . [MAR Hold] abacavir  600 mg Oral Daily  . [MAR Hold] amLODipine  5 mg Oral Daily  . [MAR Hold] atorvastatin  40 mg Oral q1800  . [START ON 06/30/2014] darbepoetin (ARANESP) injection - DIALYSIS  100 mcg Intravenous Q Tue-HD  . [MAR Hold] dolutegravir  50 mg Oral Daily  . [MAR Hold] lamiVUDine  50 mg Oral Daily  . [MAR Hold] pantoprazole (PROTONIX) IV  40 mg Intravenous Q12H  . [MAR Hold] sodium chloride  3 mL Intravenous Q12H  . [MAR Hold] vancomycin  1,000 mg Intravenous On Call to OR    Background: 59 y.o. male presenting with cough and abdominal distension in clinic, found to have supratherapeutic INR to ~8 . 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 Prior renal biopsy 2015 with diffuse proliferative lupus and a component of collapsing FSGS. Was on steroids and Cellcept in Dec when admitted here with pancytopenia and cellcept was held. Was on prednisone only after hospital discharge in December (discharge creatinine 2.77) then lost to local followup while staying in [place]. Recently returned, admitted 3/21 with abdominal pain, found to have progressive renal failure (creatinine close to 10) and deemed ESRD by Dr. Justin Mend.   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 Dr. Justin Mend has deemed ESRD. Permanent access placed today [facility] + 1st stage BVT AVF (Dr.Early). Anticipate first HD tomorrow (3/29). 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 VDRA not indicated. No phosphorus done this admission. Check with labs in AM. Binders if indicated.  HTN controlled at present  Anemia ESA started Aranesp 100 QTuesday. TSat 41 no iron needed. (Had transfusion "recently - not this admission).  Depression - declines treatment  Metabolic acidosis Should improve with dialysis  Recent DVT with supratherapeutic INR on admission - now in therapeutic range  Achalasia - dysphagia 3 diet  HIV - continue meds. Outpt ID followup.  HTN - blood pressures are soft. Would stop amlodipine to allow for vol removal with HD  Small pericardial effusion on chest CT, moderate on ECHO 3/23 and cannot exclude some tamponade physiology. Will need to watch BP carefully with hemodialysis  Jamal Maes, MD [facility] [phone] pager 06/29/2014, 3:43 PM

## 2014-06-29 NOTE — Anesthesia Procedure Notes (Signed)
Procedure Name: Intubation Date/Time: 06/29/2014 12:25 PM Performed by: Shirlyn Goltz Pre-anesthesia Checklist: Patient identified, Emergency Drugs available, Suction available and Patient being monitored Patient Re-evaluated:Patient Re-evaluated prior to inductionOxygen Delivery Method: Circle system utilized Preoxygenation: Pre-oxygenation with 100% oxygen Intubation Type: IV induction, Cricoid Pressure applied and Rapid sequence Laryngoscope Size: 4 and Mac Grade View: Grade I Tube type: Oral Tube size: 7.5 mm Number of attempts: 1 Airway Equipment and Method: Stylet Placement Confirmation: ETT inserted through vocal cords under direct vision,  positive ETCO2 and breath sounds checked- equal and bilateral Secured at: 22 cm Tube secured with: Tape Dental Injury: Teeth and Oropharynx as per pre-operative assessment

## 2014-06-29 NOTE — Progress Notes (Signed)
PT Cancellation Note  Patient Details Name: Kenneth [name] MRN: [id number] DOB: 1955-06-27   Cancelled Treatment:    Reason Eval/Treat Not Completed: Patient at procedure or test/unavailable;Medical issues which prohibited therapy.  Pt s3/28/2016   )till not back from surgery for goretex grafting and HD catheter insertion.  Will see 3/29 as able.    [name], Tessie Fass 06/29/2014, 2:31 PM  06/29/2014  Donnella Sham, PT [phone]  (pager)

## 2014-06-29 NOTE — Progress Notes (Signed)
Family Medicine Teaching Service Daily Progress Note Intern Pager: [phone]  Patient name: Kenneth [name] Medical record number: [id number] Date of birth: 01/09/1956 Age: 59 y.o. Gender: male  Primary Care Provider: Cordelia Poche, MD Consultants: ID Code Status: Full  Pt Overview and Major Events to Date:  3/21: Admitted for lab abnormalities and concerning physical exam 3/23: Patient agreeable to HD 3/24: Barium swallow completed 3/25: INR now in therapeutic range; N/V 3/26: EGD demonstrates achalasia vs severe dysmotility 3/28: Dialysis catheter and AV fistula by vascular  Assessment and Plan: Kenneth [name] is a 59 y.o. male presenting with cough and abdominal distension in clinic, found to have supratherapeutic INR to ~8 . PMH is significant for abdominal pain, anemia, DVT, CKD II, ED, HTN, hemorrhoids, HIV, HLD, lupus, PVCs, protein calorie malnutrition.  ESRD: Most recent creatinine prior to admission 2.77 and GFR 28 03/20/14. However on admission Cr 9.56 and BUN 64. Patient is not making much urine. Progression of renal disease. 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 Patient now agreeable to HD. - Renal US shows R. Surgical nephrectomy. L kidney with medical renal disease. - trend BMP - Cr stable ~9 - nephrology consulted; appreciate recs  - strict I/Os - Vascular planning on A-V fistula/graft procedure today - Will also get permcath for HD while fistula matures   Recent DVT: Supratherapeutic INR now resolved. INR in clinic >8. Value later verified at 6.3. On coumadin for DVT in January in [place], no records for verification of details. Some concern in clinic of poor understanding of meds (taking when told to stop taking) / lost to f/u. Reports no current bleeding. Decreasing appropriately.  - Continue to monitor - Hold coumadin - Continue heparin for DVT treatment and ability to easily discontinue for procedures - trend PT/INR - INR today 1.65 - once all procedures  done need to discuss anticoagulation options for patient   Fluid overload: Unclear etiology. No reported dyspnea. Lung exam mild decreased aeration. Reports LE edema is somewhat chronic. Albumin 1.1. BNP 197.  Believe patient to be intracellularly deplete but with fluid overload.  - CT chest with small bilateral effusions and small pericardial effusion - Echo showing grade 1 diastolic dysfunction with normal EF. Pericardial effusion has enlarged.   -With fluid overload dialysis should improve this. No need to consult cardiology at this time - encourage PO hydration - HD to help with fluid status - PT/OT eval - continue to evaluate and treat -  HHPT  Nausea/Vomiting: Recurrent issue for patient. Feels as though food stuck in throat. CT with chronic esophageal dilation with fluid and debris within. Patient with known esophageal dysmotility.  - prn IV Zofran and Phenergan added - IVF prn for hydration if continued n/v - EGD demonstrated retained food, suggestive of achalasia vs severe dysmotility - GI following; appreciate recs - does not appear that they will do any interventions at this time - dysphagia 3 diet when able to eat  Depression: Believe patient is suffering from depression but patient declines wanting treatment at this time. This will likely affect health outcomes if not treated appropriately. - Will continue to encourage and support patient.   Anemia: Patient reports previously having blood transfusion about a month ago for anemia. He denies bleeding. Says they did not find a source for low Hbg. Denies any recent colonoscopy. Renal insufficiency likely playing a factor and also anemia of chronic disease. FH of colon cancer. Hbg baseline appears to be 8-10.  - Hbg today 7.4 -  ESA started 3/23 - consider transfusing if Hbg <7 and/or patient symptomatic  HIV: Unclear what medications he is taking from home med list as patient is unclear about this in clinic. Previously seen by Dr  [name] (ID). States he was taking his HIV medications. Viral load and CD4 count appropriate. No concern at this time for HIV related opportunistic infections.  - ID signed off .> outpatient follow-up arranged.  - continue HAART therapy   HTN: Stable - Continue home norvasc, decr to 5mg  daily due to unsure about his compliance.  Lupus: Recent dx per patient, prednisone listed on med list but he states he is not taking.  - Will not order at this time.   -per nephrology not needed  HLD - Continued home atorvastatin  FEN/GI: NPO pending procedures then dysphagia 3 diet; PPI BID Prophylaxis: heparin gtt pending vascular procedures  Disposition: Continue current management; pending further work-up.  Subjective:  No complaints, NAEON. Has tolerated clear liquids and soft foods without dysphagia. Denies anymore n/v. Patient is aware of procedures today and is still willing to proceed.  Objective: Temp:  [98.2 F (36.8 C)-99.2 F (37.3 C)] 98.4 F (36.9 C) (03/28 0513) Pulse Rate:  [71-77] 71 (03/28 0513) Resp:  [16-18] 18 (03/28 0513) BP: (122-171)/(71-153) 128/71 mmHg (03/28 0513) SpO2:  [97 %-100 %] 98 % (03/28 0513) Weight:  [177 lb 12.8 oz (80.65 kg)] 177 lb 12.8 oz (80.65 kg) (03/28 0513) Physical Exam: General: NAD, somber-appearing, awake HEENT: AT/[place], sclera clear, EOMI, o/p clear Cardiovascular: RRR, no m/r/g Respiratory: CTAB, normal effort Abdomen: Soft, nontender, nondistended, no obvious organomegaly, +BS Extremities: 2+ LE edema to knees, no asymmetry or calf tenderness Skin: No rash or cyanosis, no bruising Neuro: Awake, alert, grossly nonfocal, moves all extremities normally   Intake/Output Summary (Last 24 hours) at 06/29/14 1345 Last data filed at 06/29/14 1309  Gross per 24 hour  Intake 757.77 ml  Output    700 ml  Net  57.77 ml   Laboratory: Results for orders placed or performed during the hospital encounter of 06/22/14 (from the past 24 hour(s))   Surgical pcr screen     Status: None   Collection Time: 06/28/14  7:48 PM  Result Value Ref Range   MRSA, PCR NEGATIVE NEGATIVE   Staphylococcus aureus NEGATIVE NEGATIVE  Protime-INR     Status: Abnormal   Collection Time: 06/29/14  9:08 AM  Result Value Ref Range   Prothrombin Time 21.4 (H) 11.6 - 15.2 seconds   INR 1.84 (H) 0.00 - 1.49  Heparin level (unfractionated)     Status: None   Collection Time: 06/29/14  9:08 AM  Result Value Ref Range   Heparin Unfractionated 0.46 0.30 - 0.70 IU/mL  CBC     Status: Abnormal   Collection Time: 06/29/14  9:08 AM  Result Value Ref Range   WBC 5.5 4.0 - 10.5 K/uL   RBC 2.47 (L) 4.22 - 5.81 MIL/uL   Hemoglobin 7.4 (L) 13.0 - 17.0 g/dL   HCT 22.8 (L) 39.0 - 52.0 %   MCV 92.3 78.0 - 100.0 fL   MCH 30.0 26.0 - 34.0 pg   MCHC 32.5 30.0 - 36.0 g/dL   RDW 15.1 11.5 - 15.5 %   Platelets 212 150 - 400 K/uL  Basic metabolic panel     Status: Abnormal   Collection Time: 06/29/14  9:08 AM  Result Value Ref Range   Sodium 140 135 - 145 mmol/L   Potassium 4.4 3.5 -  5.1 mmol/L   Chloride 116 (H) 96 - 112 mmol/L   CO2 17 (L) 19 - 32 mmol/L   Glucose, Bld 86 70 - 99 mg/dL   BUN 68 (H) 6 - 23 mg/dL   Creatinine, Ser 9.19 (H) 0.50 - 1.35 mg/dL   Calcium 7.4 (L) 8.4 - 10.5 mg/dL   GFR calc non Af Amer 6 (L) >90 mL/min   GFR calc Af Amer 7 (L) >90 mL/min   Anion gap 7 5 - 15    Imaging/Diagnostic Tests: Ct Chest and Abdomen Pelvis Wo Contrast 3/21:  CT CHEST IMPRESSION  1. Moderate degradation, secondary to lack of contrast, paucity of fat, and extent of anasarca. 2. Small bilateral pleural effusions with adjacent subsegmental atelectasis. 3. New pericardial effusion, small. 4. Chronic esophageal dilatation with fluid and debris within. This suggests a component of obstruction at the gastroesophageal junction and/or esophageal dysmotility.   CT ABDOMEN AND PELVIS IMPRESSION  1. Development of moderate abdominal pelvic ascites. 2. Apparent irregular  hepatic capsule which could be CT artifactual. Interval development of cirrhosis since 03/16/2014 is felt unlikely. Correlate with risk factors. 3. Cholelithiasis. 4. Anasarca.    Renal ultrasound 3/22: 1. Right nephrectomy and chronic left medical renal disease. 2. No hydronephrosis. 3. Known ascites.  Barium swallow 3/24: 1. Stable exam since 2007. 2. Esophageal dysmotility and dilation, but no akinesia typical of achalasia. Non-fixed narrowing at the GE junction, at least partly related to remote anti reflux surgery.  EGD 3/26: Retained food in esophagus, relieved endoscopically, suspect achalasia or severe motility disorder  Katheren Shams, DO 06/29/2014, 1:45 PM PGY-1, [place] Intern pager: [phone], text pages welcome

## 2014-06-29 NOTE — Anesthesia Preprocedure Evaluation (Addendum)
Anesthesia Evaluation  Patient identified by MRN, date of birth, ID band Patient awake    Reviewed: Allergy & Precautions, NPO status , Patient's Chart, lab work & pertinent test results  Airway Mallampati: II  TM Distance: >3 FB Neck ROM: Full    Dental no notable dental hx.    Pulmonary neg pulmonary ROS,  breath sounds clear to auscultation  Pulmonary exam normal       Cardiovascular hypertension, negative cardio ROS  Rhythm:Regular Rate:Normal  Echo 06/24/2014 - Left ventricle: The cavity size was normal. There was moderateconcentric hypertrophy. Systolic function was normal. Theestimated ejection fraction was in the range of 60% to 65%. Wallmotion was normal; there were no regional wall motionabnormalities. Doppler parameters are consistent with abnormalleft ventricular relaxation (grade 1 diastolic dysfunction). - Pulmonary arteries: Systolic pressure was mildly increased. - Pericardium, extracardiac: A moderate, pericardial effusion wasidentified circumferential to the heart. In certain views, midright atrial free wall was invaginated and respiratory variationof mitral and tricuspid Doppler inflow velocity was noted,however there was no other evidence of hemodynamic compromise(normal IVC collapse and RV contours). Cardiac tamponade remainsa bedside clinical diagnosis. There was a small left pleuraleffusion. Ascites was noted. Echodensity noted ajacent to leftatrium in the region of the GE junction.  Impressions: - Pericardial effusion is enlarged when compared to prior study.    Neuro/Psych negative neurological ROS  negative psych ROS   GI/Hepatic negative GI ROS, Neg liver ROS,   Endo/Other  negative endocrine ROS  Renal/GU ESRFRenal disease     Musculoskeletal negative musculoskeletal ROS (+)   Abdominal   Peds  Hematology  (+) anemia , HIV,   Anesthesia Other Findings    Reproductive/Obstetrics                            Anesthesia Physical Anesthesia Plan  ASA: III  Anesthesia Plan: General   Post-op Pain Management:    Induction: Intravenous  Airway Management Planned: Oral ETT  Additional Equipment: None  Intra-op Plan:   Post-operative Plan: Extubation in OR  Informed Consent: I have reviewed the patients History and Physical, chart, labs and discussed the procedure including the risks, benefits and alternatives for the proposed anesthesia with the patient or authorized representative who has indicated his/her understanding and acceptance.   Dental advisory given  Plan Discussed with: CRNA  Anesthesia Plan Comments:         Anesthesia Quick Evaluation

## 2014-06-29 NOTE — H&P (View-Only) (Signed)
Patient ID: Kenneth [name], male   DOB: June 20, 1955, 59 y.o.   MRN: [id number] Plan vascular access left upper extremity either basilic vein transposition or insertion left arm AV graft plus temporary tunneled dialysis catheter by Dr. early in a.m. Discussed with patient and he is agreeable to proceed

## 2014-06-29 NOTE — Transfer of Care (Signed)
Immediate Anesthesia Transfer of Care Note  Patient: Kenneth [name]  Procedure(s) Performed: Procedure(s): FIRST STAGE  [place] (Left) INSERTION OF DIALYSIS CATHETER RIGHT IJ (Right)  Patient Location: PACU  Anesthesia Type:General  Level of Consciousness: sedated  Airway & Oxygen Therapy: Patient Spontanous Breathing and Patient connected to nasal cannula oxygen  Post-op Assessment: Report given to RN, Post -op Vital signs reviewed and stable and Patient moving all extremities X 4  Post vital signs: Reviewed and stable  Last Vitals:  Filed Vitals:   06/29/14 0513  BP: 128/71  Pulse: 71  Temp: 36.9 C  Resp: 18    Complications: No apparent anesthesia complications

## 2014-06-29 NOTE — Progress Notes (Signed)
[facility] PROGRESS NOTE Subjective patient well known to me. He had a diagnosis achalasia back in the 1990s and ultimately underwent Heller myotomy with to pay fundoplication in [id number] after significant weight loss and classic radiographic and manometric findings of achalasia. Did well for about 15 years until he developed multiple other medical problems including chronic kidney disease, nephrectomy, lupus, HIV disease etc. Patient has had subsequent dysphagia and has undergone several previous dilatation Which he said helped some. He was admitted with weight loss and dysphagia. Had EGD by Dr. Amedeo Plenty with a lot of old food material in the esophagus it was pushed through. No gross stricture. Patient is currently on liquids and doing well. Is on heparin for plan vascular procedure to provide access for dialysis.  Objective: Vital signs in last 24 hours: Temp:  [98.2 F (36.8 C)-99.2 F (37.3 C)] 98.4 F (36.9 C) (03/28 0513) Pulse Rate:  [71-77] 71 (03/28 0513) Resp:  [16-18] 18 (03/28 0513) BP: (122-171)/(71-153) 128/71 mmHg (03/28 0513) SpO2:  [97 %-100 %] 98 % (03/28 0513) Weight:  [80.65 kg (177 lb 12.8 oz)] 80.65 kg (177 lb 12.8 oz) (03/28 0513) Last BM Date: 06/28/14  Intake/Output from previous day: 03/27 0701 - 03/28 0700 In: 1500.5 [P.O.:702; I.V.:498.5] Out: 700 [Urine:700] Intake/Output this shift:    PE: General--African-American male no acute distress  Abdomen-- soft and nontender  Lab Results:  Recent Labs  06/27/14 0530 06/28/14 0641  WBC 6.5 5.6  HGB 9.0* 7.9*  HCT 28.0* 23.9*  PLT 251 242   BMET  Recent Labs  06/27/14 0530  NA 142  K 4.4  CL 114*  CO2 19  CREATININE 9.51*   LFT No results for input(s): PROT, AST, ALT, ALKPHOS, BILITOT, BILIDIR, IBILI in the last 72 hours. PT/INR  Recent Labs  06/27/14 0530 06/28/14 0641  LABPROT 20.1* 19.6*  INR 1.70* 1.65*   PANCREAS No results for input(s): LIPASE in the last 72  hours.       Studies/Results: No results found.  Medications: I have reviewed the patient's current medications.  Assessment/Plan: 1. Achalasia. Patient is continuing to have problems. He seems to be doing reasonably well on liquids and soft foods. He has required dilation in the past. We may need to consider doing that again at some point.   Jocelyn Lowery JR,Raynell Scott L 06/29/2014, 9:00 AM

## 2014-06-29 NOTE — Discharge Summary (Signed)
[facility] Discharge Summary  Patient name: Kenneth [name] Medical record number: [id number] Date of birth: 12-13-1955 Age: 59 y.o. Gender: male Date of Admission: 06/22/2014  Date of Discharge: 07/07/14 Admitting Physician: Dickie La, MD  Primary Care Provider: Cordelia Poche, MD Consultants: GI, Nephrology, Infectious Disease,  Vascular Surgery  Indication for Hospitalization: Supratherapeutic INR  Discharge Diagnoses/Problem List:  Supratherapeutic INR  FSGS  Lupus nephritis  End stage renal disease  Anemia  Achalasia Thrombocytopenia  Protein-calorie malnutrition HIV Essential HTN  H/o DVT  Disposition: HH PT, HH OT  Discharge Condition: Improved  Discharge Exam:  Filed Vitals:   07/07/14 0520  BP: 134/81  Pulse: 71  Temp: 98.8 F (37.1 C)  Resp: 16  Gen: Comfortable sleeping in bed in NAD, pleasant. HEENT: Conjunctivae non-injected. MMM, oropharynx clear. No LAD.  Cardiovascular: RRR, no m/r/g. 2+ peripheral pulses bilaterally.  Respiratory: CTAB, no wheezing, crackles, or rhonchi. Non-labored. Abdomen: +BS, soft, nontender, non-distended Extremities: 1+ pitting edema on the R>L. No asymmetry or calf tenderness, stable persistent LUE non-pitting edema mid arm to fingers, fistula in place left antecubital fossa (bruit heard and palpable thrill), left forearm with bruising and hematoma    Skin: Left arm without significant erythema or warmth over left antecubital fossa, no extending erythema within marked area. No other rashes. Neuro: Awake, alert, grossly non-focal, limited movement in LUE ROM due to swelling, b/l grip intact, distal sensation intact    Brief Hospital Course:  Kenneth [name] is a 59 y.o. male who presented with cough and abdominal distension in clinic, found to have supratherapeutic INR to ~8 who was found to have ESRD and subsequently developed anemia and thrombocytopenia. PMH is significant for abdominal pain,  anemia, DVT, CKD II, dysphagia, ED, HTN, hemorrhoids, HIV, HLD, lupus, PVCs, and protein calorie malnutrition. He  ESRD: The patient had a prior history of CK D stage II with a creatinine 2.77 prior to admission and a GFR of 28 on 03/20/14. However, on admission Cr 9.56 and BUN 64 with decreased urine production and visible fluid overload with anasarca on admission. Albumin was 1.1. CT chest with small bilateral effusions and small pericardial effusion. Echo showed grade 1 diastolic dysfunction with normal EF. Per nephrology, there was progression of his renal disease to ESRD from FSGS collapsing disease and lupus nephritis. Renal US revealed a right surgical nephrectomy and left kidney with medical renal disease. He was stated on HD. RIJ catheter was placed for HD while fistula matures. The second stage of AV fistula graft procedure once the basilic vein dilates. Vascular to follow-up with patient as an outpatient (f/u 08/11/14 at 3:30 and 08/18/14 at 8:45). The patient's fluid status improved with HD. He was accepted at the [facility] for dialysis on Monday Wednesdays and Fridays second shift.  Fever: The patient was noted to have an isolated fever up to 101.1 on 4/1. Given his recent catheter placement, pharmacy was consulted for vancomycin. At that time, it was noted that he'd been on vancomycin since his surgery on 3/29 as there was not indicated stop date on the order. Blood cultures were obtained and negative x 4 days. His urine culture was negative.  Repeat CXR revealed stable bilateral pleural effusions and left lower lobe consolidation (stable from previous).  The patient had a warm, erythematous, indurated region in the left antecubital region which remained stable throughout the hospitalization.    Achalasia: The patient had  multiple episodes of recurrent nausea/vomiting.  CT revealed chronic esophageal dilation with fluid and debris within. Patient had prior history of receiving  esophageal dilation due to candidal esophagitis. GI was consulted. An EGD showed severe esophageal dysmotility. Due to the patient's recurrence of vomiting they performed Botox injection of lower esophageal sphincter. At time of discharge patient was able to maintain a dysphagia 3 diet (per GI, this will most likely be all he can tolerate given the severity of his disease).   Recent DVT/supratheraputic INR/Bleeding: Patient was started on Coumadin for DVT that was discovered while he was in [place] in December 2015. He presented to clinic with a supratherapeutic INR(>8). During hospitalization Coumadin was held. INR returned to normal. Initially, he was started on heparin for treatment of DVT in the interim and for  HD procedures. 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 We believe he received adequate 3 month duration of treatment for his DVT, as it sounds unprovoked from history.   Thrombocytopenia: The patient had new onset thrombocytopenia this admission: he dropped from 287 >104>78 (began on HD 8). Acute platelet drop is normal status post surgery, however hehad greater than a 50% drop in platelets and bleeding. Since patient had been on heparin for greater than 4 days, there was a concern  HIT. A HIT panel was obtained and heparin was stopped.  The HIT panel was found to be weakly positive at 0.644.  Serotonin release assay was negative. Patient's platelets dropped as low as 57, however on the day of discharge, was 83 (improving).  His bleeding subsided after that initial event. Of note, he did have a hematoma on left arm where vein exploration was performed.   Anemia: During this admission the patient's hemoglobin dropped to 5.8 on 07/03/14 without any specific cause. FOBT was negative on admission, he denied melana, hematochezia, or hematuria. The hematoma on his left arm remained  stable in size. was administered 2u pRBCs which improved his Hgb to 8.6. On the day of discharge his hemoglobin was noted to be 7.6.  Spoke to Dr. Mercy Moore with nephrology who stated they would continue to monitor his hemoglobin at dialysis, and if required could send ED for transfusions.   Depression vs adjustment disorder: Patient's mood sad/depressed during this hospitalization, however there have been many declines in his health. He has been concerned that he will no longer be able to work (which he enjoys). Patient declined pharmacologic therapy.   HTN The patient's home amlodipine was discontinued during this hospitalization and he continued to be normotensive.   The patient's other chronic conditions ( HIV and HLD) were stable during this hospitalization and the patient was continued on home medications.   Issues for Follow Up:  1. Vascular follow-up for stage 2 of AV fistula 2. Recheck platelets - new onset thrombocytopenia status post surgical procedures 3. Continue discussions with patient about treatment for depression/adjustment disorder.  4. The patient's parathyroid hormone was noted to be elevated, however appears to be improved from 8 months ago, 124 >94). 5. Patient unsure if he's ever had a colonoscopy and unable to find a record of this; given the patient's family h/o colon cancer would strongly recommend this if indicated. 6. Amlodipine was discontinued during this hospitalization and he has remained normotensive; continue to f/u as an outpatient   Significant Procedures:  06/27/14: EGD 3/28: Right IJ catheter insertion, left basilic vein stage I fistula 3/31: Botox injection of  the LES  HD on 3/29, 3/30, 4/1, 4/4  Significant Labs and Imaging:   Recent Labs Lab 07/06/14 0700 07/06/14 1414 07/07/14 0745  WBC 6.1 7.6 5.6  HGB 7.9* 9.3* 7.6*  HCT 24.0* 29.1* 23.3*  PLT 94* 73* 83*    Recent Labs Lab 07/03/14 0616 07/03/14 0727 07/04/14 0547 07/05/14 0930  07/06/14 0700  NA 137 139 139 138 139  K 4.0 4.1 3.7 3.3* 3.4*  CL 108 107 105 106 106  CO2 26 28 29 27 27   GLUCOSE 114* 103* 89 108* 92  BUN 34* 34* 21 26* 32*  CREATININE 5.99* 6.01* 4.19* 5.05* 5.33*  CALCIUM 7.1* 7.2* 7.1* 7.4* 7.0*  PHOS 3.9 3.8 3.5 3.2 3.7  ALBUMIN <1.0* <1.0* <1.0* <1.0* <1.0*  4/1: Blood culture: NGTD x 4 days  4/2 UA - spec grav 1.025, protein >300, neg nitrite, neg leuks, large hgb, WBC 7-10, RBC 21-50, rare squam/bact  Imaging/Diagnostic Tests: Ct Chest and Abdomen Pelvis Wo Contrast 3/21:  CT CHEST IMPRESSION 1. Moderate degradation, secondary to lack of contrast, paucity of fat, and extent of anasarca. 2. Small bilateral pleural effusions with adjacent subsegmental atelectasis. 3. New pericardial effusion, small. 4. Chronic esophageal dilatation with fluid and debris within. This suggests a component of obstruction at the gastroesophageal junction and/or esophageal dysmotility.  CT ABDOMEN AND PELVIS IMPRESSION 1. Development of moderate abdominal pelvic ascites. 2. Apparent irregular hepatic capsule which could be CT artifactual. Interval development of cirrhosis since 03/16/2014 is felt unlikely. Correlate with risk factors. 3. Cholelithiasis. 4. Anasarca.   Renal ultrasound 3/22: 1. Right nephrectomy and chronic left medical renal disease. 2. No hydronephrosis. 3. Known ascites.  Barium swallow 3/24: 1. Stable exam since 2007. 2. Esophageal dysmotility and dilation, but no akinesia typical of achalasia. Non-fixed narrowing at the GE junction, at least partly related to remote anti reflux surgery.  EGD 3/26: Retained food in esophagus, relieved endoscopically, suspect achalasia or severe motility disorder  4/1 CXR IMPRESSION: 1. Small right and small to moderate left pleural effusions, similar to prior. 2. Stable to minimally improved left lower lobe consolidation.   Results/Tests Pending at Time of Discharge: None  Discharge Medications:     Medication List    STOP taking these medications        amLODipine 10 MG tablet  Commonly known as:  NORVASC     predniSONE 20 MG tablet  Commonly known as:  DELTASONE     sodium bicarbonate 650 MG tablet     sulfamethoxazole-trimethoprim 800-160 MG per tablet  Commonly known as:  BACTRIM DS,SEPTRA DS     traMADol 50 MG tablet  Commonly known as:  ULTRAM     warfarin 5 MG tablet  Commonly known as:  COUMADIN      TAKE these medications        abacavir 300 MG tablet  Commonly known as:  ZIAGEN  Take 2 tablets (600 mg total) by mouth daily.     acetaminophen 500 MG tablet  Commonly known as:  TYLENOL  Take 500 mg by mouth every 6 (six) hours as needed for mild pain.     atorvastatin 40 MG tablet  Commonly known as:  LIPITOR  Take 1 tablet (40 mg total) by mouth daily.     calcium acetate 667 MG capsule  Commonly known as:  PHOSLO  Take 2 capsules (1,334 mg total) by mouth 3 (three) times daily with meals.     cyclobenzaprine 5 MG tablet  Commonly known as:  FLEXERIL  Take 5 mg by mouth 3 (three) times daily as needed for muscle spasms.     dolutegravir 50 MG tablet  Commonly known as:  TIVICAY  Take 1 tablet (50 mg total) by mouth daily.     lamiVUDine 10 MG/ML solution  Commonly known as:  EPIVIR  Take 5 mLs (50 mg total) by mouth daily.     multivitamin Tabs tablet  Take 1 tablet by mouth at bedtime.     omeprazole 20 MG capsule  Commonly known as:  PRILOSEC  Take 1 capsule (20 mg total) by mouth daily.        Discharge Instructions: Please refer to Patient Instructions section of EMR for full details.  Patient was counseled important signs and symptoms that should prompt return to medical care, changes in medications, dietary instructions, activity restrictions, and follow up appointments.   Follow-Up Appointments: Follow-up Information    Follow up with EARLY, TODD, MD In 4 weeks.   Specialty:  Vascular Surgery   Why:  Office will call you to  arrange your appt (sent)   Contact information:   [place] [address] [phone]       Follow up with Diona Fanti, DO On 07/14/2014.   Specialty:  Family Medicine   Why:  @11am  for hospital follow-up   Contact information:   [address] [place] [address] [phone]       Follow up with [facility].   Why:  They will contact you to schedule home therapy visits.    Contact information:   [address] [place] [address] [phone]       Follow up with [facility]  On 08/11/2014.   Why:  at 3:30 for testing   Contact information:   [place] [address]  [phone]      Follow up with EARLY, TODD, MD On 08/18/2014.   Specialty:  Vascular Surgery   Why:  at 8:45 for a post-op follow-up   Contact information:   [place] [address] [phone]       Archie Patten, MD 07/07/2014, 11:19 AM PGY-1, [place]

## 2014-06-30 ENCOUNTER — Encounter (HOSPITAL_COMMUNITY): Payer: Self-pay | Admitting: Vascular Surgery

## 2014-06-30 LAB — HEPARIN LEVEL (UNFRACTIONATED)
HEPARIN UNFRACTIONATED: 0.79 [IU]/mL — AB (ref 0.30–0.70)
HEPARIN UNFRACTIONATED: 1.05 [IU]/mL — AB (ref 0.30–0.70)
Heparin Unfractionated: 0.48 IU/mL (ref 0.30–0.70)

## 2014-06-30 LAB — RENAL FUNCTION PANEL
Albumin: 1.1 g/dL — ABNORMAL LOW (ref 3.5–5.2)
Anion gap: 7 (ref 5–15)
BUN: 67 mg/dL — ABNORMAL HIGH (ref 6–23)
CHLORIDE: 113 mmol/L — AB (ref 96–112)
CO2: 17 mmol/L — AB (ref 19–32)
Calcium: 7.3 mg/dL — ABNORMAL LOW (ref 8.4–10.5)
Creatinine, Ser: 9.14 mg/dL — ABNORMAL HIGH (ref 0.50–1.35)
GFR calc Af Amer: 7 mL/min — ABNORMAL LOW (ref >90)
GFR calc non Af Amer: 6 mL/min — ABNORMAL LOW (ref >90)
GLUCOSE: 128 mg/dL — AB (ref 70–99)
Phosphorus: 6.7 mg/dL — ABNORMAL HIGH (ref 2.3–4.6)
Potassium: 4.3 mmol/L (ref 3.5–5.1)
SODIUM: 137 mmol/L (ref 135–145)

## 2014-06-30 LAB — APTT: aPTT: 89 seconds — ABNORMAL HIGH (ref 24–37)

## 2014-06-30 LAB — PROTIME-INR
INR: 1.89 — ABNORMAL HIGH (ref 0.00–1.49)
INR: 1.98 — AB (ref 0.00–1.49)
PROTHROMBIN TIME: 21.8 s — AB (ref 11.6–15.2)
Prothrombin Time: 22.6 seconds — ABNORMAL HIGH (ref 11.6–15.2)

## 2014-06-30 LAB — CBC
HCT: 26.1 % — ABNORMAL LOW (ref 39.0–52.0)
HEMOGLOBIN: 8.7 g/dL — AB (ref 13.0–17.0)
MCH: 31.5 pg (ref 26.0–34.0)
MCHC: 33.3 g/dL (ref 30.0–36.0)
MCV: 94.6 fL (ref 78.0–100.0)
Platelets: 287 10*3/uL (ref 150–400)
RBC: 2.76 MIL/uL — ABNORMAL LOW (ref 4.22–5.81)
RDW: 15.5 % (ref 11.5–15.5)
WBC: 6.9 10*3/uL (ref 4.0–10.5)

## 2014-06-30 MED ORDER — DARBEPOETIN ALFA 100 MCG/0.5ML IJ SOSY
PREFILLED_SYRINGE | INTRAMUSCULAR | Status: AC
  Filled 2014-06-30: qty 0.5

## 2014-06-30 MED ORDER — MORPHINE SULFATE 2 MG/ML IJ SOLN
0.5000 mg | INTRAMUSCULAR | Status: DC | PRN
  Administered 2014-06-30: 0.5 mg via INTRAVENOUS
  Filled 2014-06-30: qty 1

## 2014-06-30 MED ORDER — DARBEPOETIN ALFA 100 MCG/0.5ML IJ SOSY
100.0000 ug | PREFILLED_SYRINGE | INTRAMUSCULAR | Status: DC
  Administered 2014-06-30: 100 ug via INTRAVENOUS
  Filled 2014-06-30: qty 0.5

## 2014-06-30 MED ORDER — CALCIUM ACETATE (PHOS BINDER) 667 MG PO CAPS
1334.0000 mg | ORAL_CAPSULE | Freq: Three times a day (TID) | ORAL | Status: DC
  Administered 2014-06-30 – 2014-07-07 (×17): 1334 mg via ORAL
  Filled 2014-06-30 (×27): qty 2

## 2014-06-30 MED ORDER — HEPARIN (PORCINE) IN NACL 100-0.45 UNIT/ML-% IJ SOLN
1000.0000 [IU]/h | INTRAMUSCULAR | Status: DC
  Administered 2014-06-30: 1000 [IU]/h via INTRAVENOUS
  Filled 2014-06-30 (×2): qty 250

## 2014-06-30 NOTE — Progress Notes (Signed)
Dr.Phelps at bedside, pt assessed, no additional sanguinous drainage to HD site. Previous drsg removed, new drsg to site, no drainage observed. Heparin on hold, pressure replied to site as ordered.

## 2014-06-30 NOTE — Progress Notes (Signed)
Interim note: Pt had Right IJ placement and left basilic vein stage I fistula placement place on 06/29/2014 for ESRD and dialysis access need. Patient had first dialysis treatment for new ESRD today and plained repeat HD tomorrow.  Pt has been on heparin drip  for "recent" DVT, poor historian.  Pt has been on coumadin at home for DVT and had been supra theraputic on admission. Coumadin was stopped on admission, last INR was today at 0236 and was 1.89, with PT of 21.8. He was started on heparin drip for DVT while admitted.  At 8:30 this evening, team was paged by nurse concerning saturated dressing at the IJ site and increased swelling of left arm. At that time they turned off the heparin and applied fresh dressing. They did not see active bleeding. 10 pm this evening site was re-assessed and pressure dressing was again saturated. The nurse states that his left arm is more swollen since her 8 pm assessment. Pt is hemodynamically stable. Pt denies numbness or tingling of the LUE.   BP 147/88 mmHg  Pulse 92  Temp(Src) 99.2 F (37.3 C) (Oral)  Resp 16  Ht 6\' 1"  (1.854 m)  Wt 173 lb 15.1 oz (78.9 kg)  BMI 22.95 kg/m2  SpO2 100% Gen: pt lying in bed, appears mildly uncomfortable.  IJ Site with saturated serosanguinous/bloody dressing and minimal appearing active bleeding. New pressure dressing applied.  EXT:  left ext with moderate swelling and +1 pitting edema. Old drainage on dressing, no active bleeding. warm skin. Palpable radial pulse. Normal ROM with moderate discomfort. Sensation intact.  A/P: - Heparin drip off since ~8:40 pm. Will reassess anticoagulation need in the morning.  - Continue to monitor for active drainage and vitals.  - Morphine 0.5 mg for pain.  - PT/INR/PTT now.  - will need to contact Nephrology in the am to make them aware prior to them taking him for HD.  - Spoke with Dr. Oneida Alar ([facility]) about pt, and would like to be called after PT/PTT returns and if drainage continues. He  will come in to assess at that time if needed.   Howard Pouch DO PGY3 [facility]

## 2014-06-30 NOTE — Progress Notes (Signed)
[facility] Progress Note  Subjective: Patient tolerating dysphagia 3 diet without difficulty  Objective: Vital signs in last 24 hours: Temp:  [97 F (36.1 C)-98.7 F (37.1 C)] 98.4 F (36.9 C) (03/29 0807) Pulse Rate:  [68-98] 94 (03/29 0900) Resp:  [12-23] 18 (03/29 0807) BP: (107-171)/(58-90) 126/86 mmHg (03/29 0900) SpO2:  [96 %-100 %] 98 % (03/29 0807) Weight:  [79.8 kg (175 lb 14.8 oz)-80.65 kg (177 lb 12.8 oz)] 79.8 kg (175 lb 14.8 oz) (03/29 0807) Weight change: 0 kg (0 lb)   PE: Abdomen soft  Lab Results: Results for orders placed or performed during the hospital encounter of 06/22/14 (from the past 24 hour(s))  Heparin level (unfractionated)     Status: None   Collection Time: 06/30/14  2:34 AM  Result Value Ref Range   Heparin Unfractionated 0.48 0.30 - 0.70 IU/mL  Protime-INR     Status: Abnormal   Collection Time: 06/30/14  2:36 AM  Result Value Ref Range   Prothrombin Time 21.8 (H) 11.6 - 15.2 seconds   INR 1.89 (H) 0.00 - 1.49  CBC     Status: Abnormal   Collection Time: 06/30/14  2:36 AM  Result Value Ref Range   WBC 6.9 4.0 - 10.5 K/uL   RBC 2.76 (L) 4.22 - 5.81 MIL/uL   Hemoglobin 8.7 (L) 13.0 - 17.0 g/dL   HCT 26.1 (L) 39.0 - 52.0 %   MCV 94.6 78.0 - 100.0 fL   MCH 31.5 26.0 - 34.0 pg   MCHC 33.3 30.0 - 36.0 g/dL   RDW 15.5 11.5 - 15.5 %   Platelets 287 150 - 400 K/uL  Renal function panel     Status: Abnormal   Collection Time: 06/30/14  2:36 AM  Result Value Ref Range   Sodium 137 135 - 145 mmol/L   Potassium 4.3 3.5 - 5.1 mmol/L   Chloride 113 (H) 96 - 112 mmol/L   CO2 17 (L) 19 - 32 mmol/L   Glucose, Bld 128 (H) 70 - 99 mg/dL   BUN 67 (H) 6 - 23 mg/dL   Creatinine, Ser 9.14 (H) 0.50 - 1.35 mg/dL   Calcium 7.3 (L) 8.4 - 10.5 mg/dL   Phosphorus 6.7 (H) 2.3 - 4.6 mg/dL   Albumin 1.1 (L) 3.5 - 5.2 g/dL   GFR calc non Af Amer 6 (L) >90 mL/min   GFR calc Af Amer 7 (L) >90 mL/min   Anion gap 7 5 - 15    Studies/Results: Dg  Chest Port 1 View  06/29/2014   CLINICAL DATA:  Postop film 4 diet diatek catheter insertion.  EXAM: PORTABLE CHEST - 1 VIEW  COMPARISON:  02/09/2014  FINDINGS: New right internal jugular dual-lumen central venous line has its distal port projecting in the right atrium.  No pneumothorax.  Moderate left and small right effusions. There is centrally predominant interstitial thickening consistent with mild edema.  Cardiac silhouette is mildly enlarged. No mediastinal or hilar masses.  IMPRESSION: 1. Dual-lumen tunneled right internal jugular central venous catheter has its distal catheter tip projecting in the right atrium. No pneumothorax. 2. Mild central interstitial pulmonary edema and moderate left and small right effusions.   Electronically Signed   By: Lajean Manes M.D.   On: 06/29/2014 15:13      Assessment: Probable achalasia status post recent relative food impaction, currently doing well after endoscopy with clearance of residual food  Plan: Would probably simply keep him on a dysphagia 3 diet for the  for Cipro future. If he develops obstructive symptoms again he may need dilatation or Botox injection.    Pranish Akhavan C 06/30/2014, 9:19 AM

## 2014-06-30 NOTE — Procedures (Signed)
I have personally attended this patient's dialysis session.   HD #1 for new ESRD Goal 2-3 liters Tight heparin 200 BFR/3 hours Tolerating thus far  Jamal Maes, MD [place] Pager 06/30/2014, 8:25 AM

## 2014-06-30 NOTE — Progress Notes (Signed)
[facility] Rounding Note Subjective:  Seen in HD Getting first treatment today for new ESRD  Objective Vital signs in last 24 hours: Filed Vitals:   06/29/14 1550 06/29/14 1635 06/29/14 2117 06/30/14 0537  BP:  120/80 135/88 141/90  Pulse:   83 88  Temp:  98.6 F (37 C) 97.4 F (36.3 C) 98.7 F (37.1 C)  TempSrc:   Oral Oral  Resp: 17 16 18 17   Height:      Weight:    80.65 kg (177 lb 12.8 oz)  SpO2: 98% 96% 100% 96%   Weight change: 0 kg (0 lb)  Intake/Output Summary (Last 24 hours) at 06/30/14 0804 Last data filed at 06/30/14 0538  Gross per 24 hour  Intake    940 ml  Output    600 ml  Net    340 ml   Physical Exam:  BP 141/90 mmHg  Pulse 88  Temp(Src) 98.7 F (37.1 C) (Oral)  Resp 17  Ht 6\' 1"  (1.854 m)  Wt 80.65 kg (177 lb 12.8 oz)  BMI 23.46 kg/m2  SpO2 96%  No JVD Lungs grossly clear  Heart sounds distant S1S2 No S3 and no pericardial rub Abd soft, no focal tenderness at this time Trace edema LE's Left upper arm AVF with + bruit. Some arm edema. Bruit better heard today   Labs:  Recent Labs Lab 06/24/14 0637 06/25/14 0745 06/26/14 0758 06/27/14 0530 06/29/14 0908 06/30/14 0236  NA 141 140 141 142 140 137  K 4.9 4.4 4.3 4.4 4.4 4.3  CL 115* 112 113* 114* 116* 113*  CO2 22 20 18* 19 17* 17*  GLUCOSE 85 92 84 73 86 128*  BUN 63* 73* 67* 72* 68* 67*  CREATININE 9.16* 9.09* 9.32* 9.51* 9.19* 9.14*  CALCIUM 7.7* 7.7* 7.8* 8.1* 7.4* 7.3*  PHOS  --   --   --   --   --  6.7*     Recent Labs Lab 06/30/14 0236  ALBUMIN 1.1*    Recent Labs Lab 06/27/14 0530 06/28/14 0641 06/29/14 0908 06/30/14 0236  WBC 6.5 5.6 5.5 6.9  HGB 9.0* 7.9* 7.4* 8.7*  HCT 28.0* 23.9* 22.8* 26.1*  MCV 92.4 90.5 92.3 94.6  PLT 251 242 212 287    Iron Studies:   Recent Labs Lab 06/23/14 1202  IRON 37*  TIBC 91*   Lab Results  Component Value Date   INR 1.89* 06/30/2014   INR 1.84* 06/29/2014   INR 1.65* 06/28/2014    Studies/Results: Dg Chest Port 1 View  06/29/2014   CLINICAL DATA:  Postop film 4 diet diatek catheter insertion.  EXAM: PORTABLE CHEST - 1 VIEW  COMPARISON:  02/09/2014  FINDINGS: New right internal jugular dual-lumen central venous line has its distal port projecting in the right atrium.  No pneumothorax.  Moderate left and small right effusions. There is centrally predominant interstitial thickening consistent with mild edema.  Cardiac silhouette is mildly enlarged. No mediastinal or hilar masses.  IMPRESSION: 1. Dual-lumen tunneled right internal jugular central venous catheter has its distal catheter tip projecting in the right atrium. No pneumothorax. 2. Mild central interstitial pulmonary edema and moderate left and small right effusions.   Electronically Signed   By: Lajean Manes M.D.   On: 06/29/2014 15:13   Medications: . sodium chloride 10 mL/hr at 06/29/14 1137  . heparin 1,100 Units/hr (06/29/14 1930)   . abacavir  600 mg Oral Daily  . amLODipine  5 mg Oral  QHS  . atorvastatin  40 mg Oral q1800  . darbepoetin (ARANESP) injection - DIALYSIS  100 mcg Intravenous Q Tue-HD  . dolutegravir  50 mg Oral Daily  . lamiVUDine  50 mg Oral Daily  . multivitamin  1 tablet Oral QHS  . pantoprazole (PROTONIX) IV  40 mg Intravenous Q12H  . sodium chloride  3 mL Intravenous Q12H  . vancomycin  1,000 mg Intravenous On Call to OR    Background: 59 y.o. male presenting with cough and abdominal distension in clinic, found to have supratherapeutic INR to ~8 . 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 Prior renal biopsy 2015 with diffuse proliferative lupus and a component of collapsing FSGS. Was on steroids and Cellcept in Dec when admitted here with pancytopenia and cellcept was  held. Was on prednisone only after hospital discharge in December (discharge creatinine 2.77) then lost to local followup while staying in [place]. Recently returned, admitted 3/21 with abdominal pain, found to have progressive renal failure (creatinine close to 10) and deemed ESRD by Dr. Justin Mend.   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 Dr. Justin Mend has deemed ESRD. Permanent access placed 06/29/14  [facility] + 1st stage BVT AVF (Dr.Early). Getting HD #1 today. Will get #2 tomorrow. 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 VDRA not indicated. Phos 6.7. Start binder.                                                         HTN controlled at present  Anemia ESA started Aranesp 100 QTuesday. TSat 41 no iron needed. (Had transfusion "recently - not this admission).  Depression - declines treatment  Metabolic acidosis Should improve with dialysis  Recent DVT with supratherapeutic INR on admission - now in therapeutic range  Achalasia - dysphagia 3 diet  HIV - continue meds. Outpt ID followup.  HTN - blood pressures are soft. Stop amlodipine to allow for UF with HD.  Small pericardial effusion on chest CT, moderate on ECHO 3/23 and cannot exclude some tamponade physiology. Will need to watch BP carefully with hemodialysis  Jamal Maes, MD [facility] [phone] pager 06/30/2014, 8:04 AM

## 2014-06-30 NOTE — Progress Notes (Signed)
Family medicine notified of decreased sanguinous drainage to HD site, drsg reinforced.

## 2014-06-30 NOTE — Progress Notes (Signed)
Family medicine at bedside, drsg to HD site changed, pressure NS bag reapplied. Pt vomited copious amt undigested abd contents, med as ordered for n/v.

## 2014-06-30 NOTE — Progress Notes (Signed)
Patient ID: Kenneth [name], male   DOB: Feb 27, 1956, 59 y.o.   MRN: [id number] Comfortable this morning. In the hemodialysis unit about the initiated via a catheter. Arm dressing intact. Mild discomfort. Palpable radial pulse. No steal symptoms. Excellent bruit and upper arm over the basilic vein. Explained will need second stage basilic vein transposition in approximately 4-6 weeks. We'll see him in the office prior to this for follow-up. We will coordinate this as an outpatient.

## 2014-06-30 NOTE — Progress Notes (Signed)
2010 copious amt sanguinous drainage to rt chest wall, HD site, LUE swelling w/small amt sang drainage, vascular paged.

## 2014-06-30 NOTE — Progress Notes (Signed)
Family Medicine Teaching Service Daily Progress Note Intern Pager: [phone]  Patient name: Kenneth [name] Medical record number: [id number] Date of birth: [birth year] Age: 59 y.o. Gender: male  Primary Care Provider: Cordelia Poche, MD Consultants: ID Code Status: Full  Pt Overview and Major Events to Date:  3/21: Admitted for lab abnormalities and concerning physical exam 3/23: Patient agreeable to HD 3/24: Barium swallow completed 3/25: INR now in therapeutic range; N/V 3/26: EGD demonstrates achalasia vs severe dysmotility 3/28: Dialysis catheter and AV fistula by vascular 3/29: First round of HD today  Assessment and Plan: Kenneth [name] is a 59 y.o. male presenting with cough and abdominal distension in clinic, found to have supratherapeutic INR to ~8 . PMH is significant for abdominal pain, anemia, DVT, CKD II, ED, HTN, hemorrhoids, HIV, HLD, lupus, PVCs, protein calorie malnutrition.  ESRD: Most recent creatinine prior to admission 2.77 and GFR 28 03/20/14. However on admission Cr 9.56 and BUN 64. Patient is not making much urine. Progression of renal disease. 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 Patient now agreeable to HD. - Renal US shows R. Surgical nephrectomy. L kidney with medical renal disease. - trend BMP - Cr stable ~9-10 - nephrology consulted; appreciate recs - will start HD today; CLIP process initiated  - strict I/Os - A-V fistula/graft procedure yesterday was succesful without complications - RIJ catheter placed for HD while fistula matures - HD started today; goal 2-3L - will also get HD tomorrow - metabolic acidosis noted on labs this AM; hopefully to correct with HD  Nausea/Vomiting: Recurrent issue for patient. Feels as though food stuck in throat. CT with chronic esophageal dilation with fluid and debris within. Patient with probably achalasia. Currently stable.  - prn IV Zofran and Phenergan  - EGD demonstrated retained food,  suggestive of achalasia vs severe dysmotility - GI following; appreciate recs - does not appear that they will do any interventions at this time if recurrs will need either dilation or Botox inhjection - dysphagia 3 diet (tolerating well)  Recent DVT: Supratherapeutic INR now resolved. INR in clinic >8. Value later verified at 6.3. On coumadin for DVT in January in [place], no records for verification of details. Some concern in clinic of poor understanding of meds (taking when told to stop taking) / lost to f/u. Reports no current bleeding.  - Continue to monitor - Hold coumadin - Continue heparin for DVT treatment and ability to easily discontinue for procedures - trend PT/INR - INR today 1.65 - once all procedures done need to discuss anticoagulation options for patient   Fluid overload: Unclear etiology. No reported dyspnea. Lung exam mild decreased aeration. Reports LE edema is somewhat chronic. Albumin 1.1. BNP 197.  Believe patient to be intracellularly deplete but with fluid overload.  - CT chest with small bilateral effusions and small pericardial effusion - Echo showing grade 1 diastolic dysfunction with normal EF. Pericardial effusion has enlarged.   -With fluid overload dialysis should improve this. No need to consult cardiology at this time - encourage PO hydration - HD to help with fluid status - PT/OT eval - continue to evaluate and treat -  HHPT  Depression: Believe patient is suffering from depression but patient declines wanting treatment at this time. This will likely affect health outcomes if not treated appropriately. - Will continue to encourage and support patient.   Anemia: Patient reports previously having blood transfusion about a month ago for anemia. He denies bleeding. Says they did  not find a source for low Hbg. Denies any recent colonoscopy. Renal insufficiency likely playing a factor and also anemia of chronic disease. FH of colon cancer. Hbg baseline appears to  be 8-10.  - Hbg stable - ESA started 3/23 - consider transfusing if Hbg <7 and/or patient symptomatic  HIV: Unclear what medications he is taking from home med list as patient is unclear about this in clinic. Previously seen by Dr [name] (ID). States he was taking his HIV medications. Viral load and CD4 count appropriate. No concern at this time for HIV related opportunistic infections.  - ID signed off .> outpatient follow-up arranged.  - continue HAART therapy   HTN: Stable - Continue home norvasc, decr to 5mg  daily due to unsure about his compliance.  Lupus: Recent dx per patient.   HLD - Continued home atorvastatin  FEN/GI: dysphagia 3 diet; PPI BID Prophylaxis: heparin gtt pending vascular procedures  Disposition: Continue current management; pending further management.  Subjective:  No complaints, NAEON. Has tolerated dysphagia 3 diet well without further episodes of vomitting. He is in HD unit this morning.   Objective: Temp:  [97 F (36.1 C)-98.7 F (37.1 C)] 98.7 F (37.1 C) (03/29 0537) Pulse Rate:  [68-88] 88 (03/29 0537) Resp:  [12-23] 17 (03/29 0537) BP: (107-171)/(58-90) 141/90 mmHg (03/29 0537) SpO2:  [96 %-100 %] 96 % (03/29 0537) Weight:  [177 lb 12.8 oz (80.65 kg)] 177 lb 12.8 oz (80.65 kg) (03/29 0537) Physical Exam: General: NAD, somber-appearing, awake HEENT: AT/[place], sclera clear, EOMI, o/p clear Cardiovascular: RRR, no m/r/g Chest wall: R IJ without surrounding erythema Respiratory: CTAB, normal effort Abdomen: Soft, nontender, nondistended, no obvious organomegaly, +BS Extremities: 2+ LE edema to knees, no asymmetry or calf tenderness, L arm swollen with dressing. Bruit palpable in upper arm.  Skin: No rash or cyanosis, no bruising Neuro: Awake, alert, grossly nonfocal, moves all extremities normally   Intake/Output Summary (Last 24 hours) at 06/30/14 0731 Last data filed at 06/30/14 0538  Gross per 24 hour  Intake    940 ml  Output    600 ml   Net    340 ml   Laboratory: Results for orders placed or performed during the hospital encounter of 06/22/14 (from the past 24 hour(s))  Protime-INR     Status: Abnormal   Collection Time: 06/29/14  9:08 AM  Result Value Ref Range   Prothrombin Time 21.4 (H) 11.6 - 15.2 seconds   INR 1.84 (H) 0.00 - 1.49  Heparin level (unfractionated)     Status: None   Collection Time: 06/29/14  9:08 AM  Result Value Ref Range   Heparin Unfractionated 0.46 0.30 - 0.70 IU/mL  CBC     Status: Abnormal   Collection Time: 06/29/14  9:08 AM  Result Value Ref Range   WBC 5.5 4.0 - 10.5 K/uL   RBC 2.47 (L) 4.22 - 5.81 MIL/uL   Hemoglobin 7.4 (L) 13.0 - 17.0 g/dL   HCT 22.8 (L) 39.0 - 52.0 %   MCV 92.3 78.0 - 100.0 fL   MCH 30.0 26.0 - 34.0 pg   MCHC 32.5 30.0 - 36.0 g/dL   RDW 15.1 11.5 - 15.5 %   Platelets 212 150 - 400 K/uL  Basic metabolic panel     Status: Abnormal   Collection Time: 06/29/14  9:08 AM  Result Value Ref Range   Sodium 140 135 - 145 mmol/L   Potassium 4.4 3.5 - 5.1 mmol/L   Chloride 116 (H)  96 - 112 mmol/L   CO2 17 (L) 19 - 32 mmol/L   Glucose, Bld 86 70 - 99 mg/dL   BUN 68 (H) 6 - 23 mg/dL   Creatinine, Ser 9.19 (H) 0.50 - 1.35 mg/dL   Calcium 7.4 (L) 8.4 - 10.5 mg/dL   GFR calc non Af Amer 6 (L) >90 mL/min   GFR calc Af Amer 7 (L) >90 mL/min   Anion gap 7 5 - 15  Heparin level (unfractionated)     Status: None   Collection Time: 06/30/14  2:34 AM  Result Value Ref Range   Heparin Unfractionated 0.48 0.30 - 0.70 IU/mL  Protime-INR     Status: Abnormal   Collection Time: 06/30/14  2:36 AM  Result Value Ref Range   Prothrombin Time 21.8 (H) 11.6 - 15.2 seconds   INR 1.89 (H) 0.00 - 1.49  CBC     Status: Abnormal   Collection Time: 06/30/14  2:36 AM  Result Value Ref Range   WBC 6.9 4.0 - 10.5 K/uL   RBC 2.76 (L) 4.22 - 5.81 MIL/uL   Hemoglobin 8.7 (L) 13.0 - 17.0 g/dL   HCT 26.1 (L) 39.0 - 52.0 %   MCV 94.6 78.0 - 100.0 fL   MCH 31.5 26.0 - 34.0 pg   MCHC 33.3  30.0 - 36.0 g/dL   RDW 15.5 11.5 - 15.5 %   Platelets 287 150 - 400 K/uL  Renal function panel     Status: Abnormal   Collection Time: 06/30/14  2:36 AM  Result Value Ref Range   Sodium 137 135 - 145 mmol/L   Potassium 4.3 3.5 - 5.1 mmol/L   Chloride 113 (H) 96 - 112 mmol/L   CO2 17 (L) 19 - 32 mmol/L   Glucose, Bld 128 (H) 70 - 99 mg/dL   BUN 67 (H) 6 - 23 mg/dL   Creatinine, Ser 9.14 (H) 0.50 - 1.35 mg/dL   Calcium 7.3 (L) 8.4 - 10.5 mg/dL   Phosphorus 6.7 (H) 2.3 - 4.6 mg/dL   Albumin 1.1 (L) 3.5 - 5.2 g/dL   GFR calc non Af Amer 6 (L) >90 mL/min   GFR calc Af Amer 7 (L) >90 mL/min   Anion gap 7 5 - 15    Imaging/Diagnostic Tests: Ct Chest and Abdomen Pelvis Wo Contrast 3/21:  CT CHEST IMPRESSION  1. Moderate degradation, secondary to lack of contrast, paucity of fat, and extent of anasarca. 2. Small bilateral pleural effusions with adjacent subsegmental atelectasis. 3. New pericardial effusion, small. 4. Chronic esophageal dilatation with fluid and debris within. This suggests a component of obstruction at the gastroesophageal junction and/or esophageal dysmotility.   CT ABDOMEN AND PELVIS IMPRESSION  1. Development of moderate abdominal pelvic ascites. 2. Apparent irregular hepatic capsule which could be CT artifactual. Interval development of cirrhosis since 03/16/2014 is felt unlikely. Correlate with risk factors. 3. Cholelithiasis. 4. Anasarca.    Renal ultrasound 3/22: 1. Right nephrectomy and chronic left medical renal disease. 2. No hydronephrosis. 3. Known ascites.  Barium swallow 3/24: 1. Stable exam since 2007. 2. Esophageal dysmotility and dilation, but no akinesia typical of achalasia. Non-fixed narrowing at the GE junction, at least partly related to remote anti reflux surgery.  EGD 3/26: Retained food in esophagus, relieved endoscopically, suspect achalasia or severe motility disorder  Katheren Shams, DO 06/30/2014, 7:31 AM PGY-1, [facility] Intern pager: [phone], text pages welcome

## 2014-06-30 NOTE — Progress Notes (Signed)
ANTICOAGULATION CONSULT NOTE - Follow Up Consult  Pharmacy Consult:  Heparin Indication:  History of DVT  Allergies  Allergen Reactions  . Amoxicillin     REACTION: diffuse rash    Patient Measurements: Height: 6\' 1"  (185.4 cm) Weight: 175 lb 14.8 oz (79.8 kg) IBW/kg (Calculated) : 79.9 Heparin Dosing Weight: 81 kg  Vital Signs: Temp: 98.4 F (36.9 C) (03/29 0807) Temp Source: Oral (03/29 0807) BP: 127/87 mmHg (03/29 1100) Pulse Rate: 93 (03/29 1100)  Labs:  Recent Labs  06/28/14 0641  06/29/14 0908 06/30/14 0234 06/30/14 0236 06/30/14 1000  HGB 7.9*  --  7.4*  --  8.7*  --   HCT 23.9*  --  22.8*  --  26.1*  --   PLT 242  --  212  --  287  --   LABPROT 19.6*  --  21.4*  --  21.8*  --   INR 1.65*  --  1.84*  --  1.89*  --   HEPARINUNFRC 0.70  < > 0.46 0.48  --  0.79*  CREATININE  --   --  9.19*  --  9.14*  --   < > = values in this interval not displayed.  Estimated Creatinine Clearance: 10.2 mL/min (by C-G formula based on Cr of 9.14).    Assessment: 18 YOM on Coumadin PTA for history of DVT in January.  Coumadin was reversed on admit due to supra-therapeutic INR and it remained on hold for perm cath and fistula placement.  Now s/p procedure and patient continues on IV heparin bridge.  Heparin level slightly supra-therapeutic although patient was previously stable on the same rate.  Verified with HD RN that heparin level was drawn appropriately and heparin was not given during HD.  No overt bleeding reported.   Goal of Therapy:  Heparin level 0.3-0.7 units/ml  Monitor platelets by anticoagulation protocol: Yes    Plan:  - Decrease heparin gtt to 1000 units/hr - Recheck HL 8 hours post rate adjustment - Daily HL / CBC - F/U resuming Coumadin when possible    Kiyanna Biegler D. Mina Marble, PharmD, BCPS Pager:  [phone] 06/30/2014, 11:21 AM

## 2014-06-30 NOTE — Progress Notes (Signed)
Resting quietly, eyes closed, no distress noted.

## 2014-06-30 NOTE — Progress Notes (Signed)
ANTICOAGULATION CONSULT NOTE - Follow Up Consult  Pharmacy Consult for heparin Indication: DVT   Labs:  Recent Labs  06/27/14 0530  06/28/14 0641 06/28/14 1145 06/29/14 0908 06/30/14 0234 06/30/14 0236  HGB 9.0*  --  7.9*  --  7.4*  --  8.7*  HCT 28.0*  --  23.9*  --  22.8*  --  26.1*  PLT 251  --  242  --  212  --  287  LABPROT 20.1*  --  19.6*  --  21.4*  --  21.8*  INR 1.70*  --  1.65*  --  1.84*  --  1.89*  HEPARINUNFRC  --   < > 0.70 0.68 0.46 0.48  --   CREATININE 9.51*  --   --   --  9.19*  --  9.14*  < > = values in this interval not displayed.    Assessment/Plan:  59yo male therapeutic on heparin after resumed s/p cath/fistula placement. Will continue gtt at current rate and confirm stable with additional level.   Wynona Neat, PharmD, BCPS  06/30/2014,4:28 AM

## 2014-06-30 NOTE — Progress Notes (Signed)
NUTRITION FOLLOW UP  Intervention:   -Continue with current plan of care  Nutrition Dx:   Increased nutrient needs related to chronic illness as evidenced by estimated energy needs; ongoing  Goal:   Pt to meet >/= 90% of estimated energy needs; met  Monitor:   PO intake, weight trends, labs  Assessment:   59 y/o male with history of lupus and HIV that was seen in the clinic and sent to the hospital d/t abdominal distention and found to have a supratherapeutic INR.   Pt started HD on 06/30/14; HD cath placed on 06/29/14.  Pt s/p EGD on 3/27. Per gastroenterology notes, food was removed and pt will require either botox injections or esophageal dilation if problem persists. Pt reports continued good appetite, consuming 50-100% of meals. He reports he has transitioned from liquid diet to solid foods nicely. He denies any further difficulty swallowing or sensation of food being stuck. Pt grateful for visit and denies ay further nutrition needs at this time.  Labs reviewed. Cl: 113, CO2: 17, BUN/Creat: 67/9.14, Calcium: 7.3, Phos: 6.7, Glucose: 128.   Height: Ht Readings from Last 1 Encounters:  06/22/14 6' 1"  (1.854 m)    Weight Status:   Wt Readings from Last 1 Encounters:  06/30/14 173 lb 15.1 oz (78.9 kg)    Re-estimated needs:  Kcal: 1950-2200 Protein: 90-110 grams Fluid: >/= 1.7 L daily  Skin: Intact  Diet Order: DIET DYS 3 Room service appropriate?: Yes; Fluid consistency:: Thin; Fluid restriction:: 1200 mL Fluid   Intake/Output Summary (Last 24 hours) at 06/30/14 1358 Last data filed at 06/30/14 1107  Gross per 24 hour  Intake    940 ml  Output   1450 ml  Net   -510 ml    Last BM: 06/29/14   Labs:   Recent Labs Lab 06/27/14 0530 06/29/14 0908 06/30/14 0236  NA 142 140 137  K 4.4 4.4 4.3  CL 114* 116* 113*  CO2 19 17* 17*  BUN 72* 68* 67*  CREATININE 9.51* 9.19* 9.14*  CALCIUM 8.1* 7.4* 7.3*  PHOS  --   --  6.7*  GLUCOSE 73 86 128*    CBG (last  3)  No results for input(s): GLUCAP in the last 72 hours.  Scheduled Meds: . abacavir  600 mg Oral Daily  . amLODipine  5 mg Oral QHS  . atorvastatin  40 mg Oral q1800  . calcium acetate  1,334 mg Oral TID WC  . [START ON 07/02/2014] darbepoetin (ARANESP) injection - DIALYSIS  100 mcg Intravenous Q Thu-HD  . dolutegravir  50 mg Oral Daily  . lamiVUDine  50 mg Oral Daily  . multivitamin  1 tablet Oral QHS  . pantoprazole (PROTONIX) IV  40 mg Intravenous Q12H  . sodium chloride  3 mL Intravenous Q12H  . vancomycin  1,000 mg Intravenous On Call to OR    Continuous Infusions: . sodium chloride 10 mL/hr at 06/29/14 1137  . heparin 1,000 Units/hr (06/30/14 1256)    Kinnick Maus A. Jimmye Norman, RD, LDN, CDE Pager: [phone] After hours Pager: [phone]

## 2014-06-30 NOTE — Progress Notes (Signed)
Physical Therapy Treatment Patient Details Name: Kenneth [name] MRN: [id number] DOB: 1955/08/07 Today's Date: 06/30/2014    History of Present Illness pt is a 59 y/o male admitted for abdominal distension, but on admission noted to have supratherapeutic INR over 8.  Work up continues.    PT Comments    Pt notably more unsteady during gait post catheter placement yesterday and HD this am.  He has been asleep in bed most of the afternoon which likely contributes.  Follow Up Recommendations  Home health PT     Equipment Recommendations  None recommended by PT    Recommendations for Other Services       Precautions / Restrictions      Mobility  Bed Mobility Overal bed mobility: Needs Assistance Bed Mobility: Supine to Sit;Sit to Supine     Supine to sit: Supervision Sit to supine: Supervision   General bed mobility comments: min to reposition up toward [facility]  Transfers Overall transfer level: Needs assistance Equipment used: None Transfers: Sit to/from Stand Sit to Stand: Supervision            Ambulation/Gait Ambulation/Gait assistance: Min guard Ambulation Distance (Feet): 350 Feet Assistive device: None Gait Pattern/deviations: Step-through pattern Gait velocity: slower   General Gait Details: mildly unsteady overall today with episodes of listing/staggering off to right with any scanning or turning as if dizzy or with mild vertigol   Stairs            Wheelchair Mobility    Modified Rankin (Stroke Patients Only)       Balance Overall balance assessment: Needs assistance                           High level balance activites: Direction changes;Turns;Head turns;Backward walking High Level Balance Comments: No LOB, but more unsteady with these higher level balance tasks    Cognition Arousal/Alertness: Awake/alert Behavior During Therapy: WFL for tasks assessed/performed Overall Cognitive Status: Within Functional Limits for tasks  assessed                      Exercises      General Comments        Pertinent Vitals/Pain Pain Assessment: Faces Faces Pain Scale: Hurts little more Pain Location: L arm HD catheter site Pain Descriptors / Indicators: Aching Pain Intervention(s): Monitored during session    Home Living                      Prior Function            PT Goals (current goals can now be found in the care plan section) Acute Rehab PT Goals Patient Stated Goal: stronger PT Goal Formulation: With patient Time For Goal Achievement: 07/02/14 Potential to Achieve Goals: Good Progress towards PT goals: Progressing toward goals    Frequency  Min 3X/week    PT Plan Current plan remains appropriate    Co-evaluation             End of Session   Activity Tolerance: Patient tolerated treatment well Patient left: in bed;with call bell/phone within reach     Time: [id number] PT Time Calculation (min) (ACUTE ONLY): 14 min  Charges:  $Gait Training: 8-22 mins                    G Codes:      Eliyana Pagliaro, Tessie Fass 06/30/2014, 4:23 PM 06/30/2014  Donnella Sham, PT [phone]  (pager)

## 2014-06-30 NOTE — Progress Notes (Signed)
FPTS Interim Progress Note  S: Was called to room by nurse due to "copious" drainage from HD cath site and left arm. Upon entering the room patient looks comfortable lying in bed. There does not appear to be any active bleeding that could not be stopped with pressure. Removed dressings and watched for continued bleeding. No bleeding was observed.  O: BP 154/95 mmHg  Pulse 88  Temp(Src) 99.2 F (37.3 C) (Oral)  Resp 16  Ht 6\' 1"  (1.854 m)  Wt 173 lb 15.1 oz (78.9 kg)  BMI 22.95 kg/m2  SpO2 100%   General: fatigued, ill-appearing, NAD Cardiac: RRR Extremities: L. Arm swollen with yellow tenting of hand. Mildly tender to palpation.   A/P: Vital signs reviewed and patient hemodynamically stable. Will monitor for continued bleeding. Nurse will continue to apply pressure for another 15 minutes. Discontinue heparin at this time. RN given instructions to page M.D. if continued drainage through dressing. At that time if needed will notify vascular surgery to come reevaluate patient. CBC ordered for AM.  Katheren Shams, DO 06/30/2014, 8:41 PM

## 2014-06-30 NOTE — Progress Notes (Signed)
Dr. Gerarda Fraction returned call, will notify vascular surgery of bloody drainage to HD site and LUE swelling.

## 2014-07-01 LAB — RENAL FUNCTION PANEL
Albumin: 1 g/dL — ABNORMAL LOW (ref 3.5–5.2)
Anion gap: 5 (ref 5–15)
BUN: 48 mg/dL — AB (ref 6–23)
CO2: 23 mmol/L (ref 19–32)
Calcium: 7.2 mg/dL — ABNORMAL LOW (ref 8.4–10.5)
Chloride: 114 mmol/L — ABNORMAL HIGH (ref 96–112)
Creatinine, Ser: 7.21 mg/dL — ABNORMAL HIGH (ref 0.50–1.35)
GFR calc Af Amer: 9 mL/min — ABNORMAL LOW (ref >90)
GFR calc non Af Amer: 8 mL/min — ABNORMAL LOW (ref >90)
Glucose, Bld: 94 mg/dL (ref 70–99)
Phosphorus: 5.1 mg/dL — ABNORMAL HIGH (ref 2.3–4.6)
Potassium: 3.9 mmol/L (ref 3.5–5.1)
Sodium: 142 mmol/L (ref 135–145)

## 2014-07-01 LAB — PROTIME-INR
INR: 1.93 — AB (ref 0.00–1.49)
Prothrombin Time: 22.2 seconds — ABNORMAL HIGH (ref 11.6–15.2)

## 2014-07-01 LAB — CBC
HCT: 21.4 % — ABNORMAL LOW (ref 39.0–52.0)
Hemoglobin: 7.2 g/dL — ABNORMAL LOW (ref 13.0–17.0)
MCH: 30.8 pg (ref 26.0–34.0)
MCHC: 33.6 g/dL (ref 30.0–36.0)
MCV: 91.5 fL (ref 78.0–100.0)
Platelets: 104 10*3/uL — ABNORMAL LOW (ref 150–400)
RBC: 2.34 MIL/uL — ABNORMAL LOW (ref 4.22–5.81)
RDW: 15.6 % — AB (ref 11.5–15.5)
WBC: 5.9 10*3/uL (ref 4.0–10.5)

## 2014-07-01 LAB — APTT: APTT: 41 s — AB (ref 24–37)

## 2014-07-01 MED ORDER — ANTICOAGULANT SODIUM CITRATE 4% (200MG/5ML) IV SOLN
5.0000 mL | Freq: Once | Status: AC
  Administered 2014-07-01: 5 mL via INTRAVENOUS
  Filled 2014-07-01: qty 250

## 2014-07-01 NOTE — Progress Notes (Signed)
Family Medicine Teaching Service Daily Progress Note Intern Pager: [phone]  Patient name: Kenneth [name] Medical record number: [id number] Date of birth: 14-Dec-1955 Age: 59 y.o. Gender: male  Primary Care Provider: Cordelia Poche, MD Consultants: ID Code Status: Full  Pt Overview and Major Events to Date:  3/21: Admitted for lab abnormalities and concerning physical exam 3/23: Patient agreeable to HD 3/24: Barium swallow completed 3/25: INR now in therapeutic range; N/V 3/26: EGD demonstrates achalasia vs severe dysmotility 3/28: Dialysis catheter and AV fistula by vascular 3/29: First round of HD today; started bleeding from RIJ and L. Arm fistula site  Assessment and Plan: Kenneth [name] is a 59 y.o. male presenting with cough and abdominal distension in clinic, found to have supratherapeutic INR to ~8 . PMH is significant for abdominal pain, anemia, DVT, CKD II, ED, HTN, hemorrhoids, HIV, HLD, lupus, PVCs, protein calorie malnutrition.  ESRD: Most recent creatinine prior to admission 2.77 and GFR 28 03/20/14. However on admission Cr 9.56 and BUN 64. Patient is not making much urine. Progression of renal disease. 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 Renal US shows R. Surgical nephrectomy. L kidney with medical renal disease. Now receiving HD (started 3/29). - trend BMP - Cr improved s/p HD - nephrology consulted; appreciate recs - HD again today; CLIP process initiated  - strict I/Os - first A-V fistula/graft procedure 3/29 was succesful without complications - stage 2 today??  - RIJ catheter placed for HD while fistula matures  - Overnight patient had diffuse coagulopathy from site - vascular service aware - HD started 3/29; patient was able to get 1L of fluid off yesterday  -scheduled HD again today - spoke with Dr. Lorrene Reid who says that if RIJ not compromised can still have HD without heparin.   Nausea/Vomiting: Recurrent issue for patient. Feels as  though food stuck in throat. CT with chronic esophageal dilation with fluid and debris within. Patient with probably achalasia. Acutely worsening again and not able to take PO medications. - prn IV Zofran and Phenergan  - EGD demonstrated retained food, suggestive of achalasia vs severe dysmotility - GI following; appreciate recs - with recurrence GI consulted again   -per prior recs pt will need either dilation or Botox inhjection - making NPO for possible procedure  Recent DVT: Supratherapeutic INR now resolved. INR in clinic >8. Value later verified at 6.3. On coumadin for DVT in January in [place], no records for verification of details. Some concern in clinic of poor understanding of meds (taking when told to stop taking) / lost to f/u. Currently bleeding. - Continue to monitor PT/INR - today 1.93 - Hold all anticoagulation including heparin due bleeding   - re-evaluate patient need for treatment; at this time patient does not need any more theraupetic anticoagulation. Has received 3 months worth of treatment. Appears to have been unprovoked DVT -SCDs only recent bleeding.  -Still awaiting records  Thrombocytopenia: Plts this morning 104. All prior labs plts >200.  Could just be due to recent surgical procedure but with >50% drop and bleeding overnight this is concerning.  - HIT panel ordered; HIT score 6 which correlates to high risk. Started heparin on 3/26. -continue to monitor  -holding heparin and pharmacologic anticoagulation -possible hematoma on L arm; swelling and tenderness noted.   Fluid overload: Unclear etiology. No reported dyspnea. Lung exam mild decreased aeration. Reports LE edema is somewhat chronic. Albumin 1.1. BNP 197.  Believe patient to be intracellularly deplete but with  fluid overload. CT chest with small bilateral effusions and small pericardial effusion. Echo showing grade 1 diastolic dysfunction with normal EF. Stable and improving with HD.  - encourage PO  hydration - HD to help with fluid status - PT/OT eval - continue to evaluate and treat -  HHPT  Depression: Believe patient is suffering from depression but patient declines wanting treatment at this time. This will likely affect health outcomes if not treated appropriately. - Will continue to encourage and support patient.   Anemia: Patient reports previously having blood transfusion about a month ago for anemia. He denies bleeding. Says they did not find a source for low Hbg. Denies any recent colonoscopy. Renal insufficiency likely playing a factor and also anemia of chronic disease. FH of colon cancer. Hbg baseline appears to be 8-10.  - Hbg 7.2 today; mild decrease from baseline. Continue to monitor with on going bleeding.  - ESA started 3/23 - consider transfusing if Hbg <7 and/or patient symptomatic  HIV: Unclear what medications he is taking from home med list as patient is unclear about this in clinic. Previously seen by Dr [name] (ID). States he was taking his HIV medications. Viral load and CD4 count appropriate. No concern at this time for HIV related opportunistic infections.  - ID signed off .> outpatient follow-up arranged.  - continue HAART therapy   HTN: Stable - Continue home norvasc, decr to 5mg  daily due to unsure about his compliance.  Lupus: Recent dx per patient.   HLD - Continued home atorvastatin  FEN/GI: dysphagia 3 diet; PPI BID Prophylaxis: SCDs  Disposition: Continue current management; pending improvement of multiple medical conditions.   Subjective:  Patient doing well this morning. He has had on going bleeding from IJ and fistula sites s/p procedures yesterday. Patient is hemodynamically stable. He states that he is also having the feeling of food being stuck in throat and that he vomited yesterday evening. Patient denies any CP, SOB, or pain.   Objective: Temp:  [98.2 F (36.8 C)-99.2 F (37.3 C)] 98.4 F (36.9 C) (03/30 0535) Pulse Rate:   [84-98] 84 (03/30 0535) Resp:  [16-20] 18 (03/30 0535) BP: (115-154)/(83-101) 148/95 mmHg (03/30 0535) SpO2:  [98 %-100 %] 99 % (03/30 0535) Weight:  [173 lb 8 oz (78.7 kg)-175 lb 14.8 oz (79.8 kg)] 173 lb 8 oz (78.7 kg) (03/30 0535) Physical Exam: General: appears uncomfortable but NAD, somber-appearing, awake HEENT: AT/[place], sclera clear, EOMI, o/p clear Cardiovascular: RRR, no m/r/g Chest wall: R IJ with saturated bloody dressing.  Respiratory: CTAB, normal effort Abdomen: Soft, nontender, nondistended, no obvious organomegaly, +BS Extremities: 2+ LE edema to knees, no asymmetry or calf tenderness, left ext with moderate swelling, wrapped in ace bandage. Warm skin. Palpable radial pulse. Bruit palpable in upper arm.  Skin: No rash or cyanosis. Neuro: Awake, alert, grossly nonfocal, moves all extremities normally   Intake/Output Summary (Last 24 hours) at 07/01/14 0758 Last data filed at 06/30/14 2300  Gross per 24 hour  Intake    120 ml  Output   1300 ml  Net  -1180 ml   Laboratory: Results for orders placed or performed during the hospital encounter of 06/22/14 (from the past 24 hour(s))  Heparin level (unfractionated)     Status: Abnormal   Collection Time: 06/30/14 10:00 AM  Result Value Ref Range   Heparin Unfractionated 0.79 (H) 0.30 - 0.70 IU/mL  Heparin level (unfractionated)     Status: Abnormal   Collection Time: 06/30/14  7:20  PM  Result Value Ref Range   Heparin Unfractionated 1.05 (H) 0.30 - 0.70 IU/mL  Protime-INR     Status: Abnormal   Collection Time: 06/30/14 11:11 PM  Result Value Ref Range   Prothrombin Time 22.6 (H) 11.6 - 15.2 seconds   INR 1.98 (H) 0.00 - 1.49  APTT     Status: Abnormal   Collection Time: 06/30/14 11:11 PM  Result Value Ref Range   aPTT 89 (H) 24 - 37 seconds  Protime-INR     Status: Abnormal   Collection Time: 07/01/14  5:10 AM  Result Value Ref Range   Prothrombin Time 22.2 (H) 11.6 - 15.2 seconds   INR 1.93 (H) 0.00 - 1.49  CBC      Status: Abnormal   Collection Time: 07/01/14  5:10 AM  Result Value Ref Range   WBC 5.9 4.0 - 10.5 K/uL   RBC 2.34 (L) 4.22 - 5.81 MIL/uL   Hemoglobin 7.2 (L) 13.0 - 17.0 g/dL   HCT 21.4 (L) 39.0 - 52.0 %   MCV 91.5 78.0 - 100.0 fL   MCH 30.8 26.0 - 34.0 pg   MCHC 33.6 30.0 - 36.0 g/dL   RDW 15.6 (H) 11.5 - 15.5 %   Platelets 104 (L) 150 - 400 K/uL  Renal function panel     Status: Abnormal   Collection Time: 07/01/14  5:10 AM  Result Value Ref Range   Sodium 142 135 - 145 mmol/L   Potassium 3.9 3.5 - 5.1 mmol/L   Chloride 114 (H) 96 - 112 mmol/L   CO2 23 19 - 32 mmol/L   Glucose, Bld 94 70 - 99 mg/dL   BUN 48 (H) 6 - 23 mg/dL   Creatinine, Ser 7.21 (H) 0.50 - 1.35 mg/dL   Calcium 7.2 (L) 8.4 - 10.5 mg/dL   Phosphorus 5.1 (H) 2.3 - 4.6 mg/dL   Albumin 1.0 (L) 3.5 - 5.2 g/dL   GFR calc non Af Amer 8 (L) >90 mL/min   GFR calc Af Amer 9 (L) >90 mL/min   Anion gap 5 5 - 15  APTT     Status: Abnormal   Collection Time: 07/01/14  5:10 AM  Result Value Ref Range   aPTT 41 (H) 24 - 37 seconds    Imaging/Diagnostic Tests: Ct Chest and Abdomen Pelvis Wo Contrast 3/21:  CT CHEST IMPRESSION  1. Moderate degradation, secondary to lack of contrast, paucity of fat, and extent of anasarca. 2. Small bilateral pleural effusions with adjacent subsegmental atelectasis. 3. New pericardial effusion, small. 4. Chronic esophageal dilatation with fluid and debris within. This suggests a component of obstruction at the gastroesophageal junction and/or esophageal dysmotility.   CT ABDOMEN AND PELVIS IMPRESSION  1. Development of moderate abdominal pelvic ascites. 2. Apparent irregular hepatic capsule which could be CT artifactual. Interval development of cirrhosis since 03/16/2014 is felt unlikely. Correlate with risk factors. 3. Cholelithiasis. 4. Anasarca.    Renal ultrasound 3/22: 1. Right nephrectomy and chronic left medical renal disease. 2. No hydronephrosis. 3. Known ascites.  Barium  swallow 3/24: 1. Stable exam since 2007. 2. Esophageal dysmotility and dilation, but no akinesia typical of achalasia. Non-fixed narrowing at the GE junction, at least partly related to remote anti reflux surgery.  EGD 3/26: Retained food in esophagus, relieved endoscopically, suspect achalasia or severe motility disorder  Katheren Shams, DO 07/01/2014, 7:58 AM PGY-1, [place] Intern pager: [phone], text pages welcome

## 2014-07-01 NOTE — Progress Notes (Signed)
[facility] service for bleeding from all incisions as well as dialysis catheter.  Pt had been on heparin drip for DVT.  He also had dialysis today.  Bleeding was noted around 8 pm and heparin was stopped.  INR went from 1.89 to 1.98 despite no coumadin.  PTT 89 almost 3 hours after heparin discontinued. Pt also with increased pain and swelling in left wrist/arm.  He denies numbness or tingling in hand.  PE  Filed Vitals:   06/30/14 2006 06/30/14 2007 06/30/14 2008 06/30/14 2100  BP: 145/101 151/101 154/95 147/88  Pulse: 92  88 92  Temp: 99.2 F (37.3 C) 99.2 F (37.3 C)    TempSrc: Oral     Resp: 16 16    Height:      Weight:      SpO2: 99% 99% 100%     Left upper extremity some fluctuance over vein exploration site 2-3 cm hematoma, left upper extremity diffusely swollen compared to left appproximately 25% larger, no obvious hematoma over antecubital incision, easily palpable  [place]  Right chest wall dialysis cathter minimal oozing at this point but still persistant  A:  Diffuse coagulapathy most likely from supratherapeutic heparin from catheter or IV heparin P: Continue to hold heparin for now.  Primary service is investigating indications so that possibly anticoagulation can be stopped. Catheter redressed ACE applied to left wrist area Hopefully this will improve as coags normalize otherwise may need evacuation of hematoma Repeat CBC and coags later this morning  Ruta Hinds, MD [facility]: [phone] Pager: [phone]

## 2014-07-01 NOTE — Progress Notes (Signed)
Family medicine rounded, will cont to monitor drainage to HD site during hs. Pt resting quietly, pain level 2/10.

## 2014-07-01 NOTE — Progress Notes (Signed)
Hemodialysis= Pt tolerating well. HIT panel sent.

## 2014-07-01 NOTE — Progress Notes (Signed)
Hemodialysis- No further bleeding during HD. Catheter dressing changed, surgifoam  reinforced with 4x4 applied. Cath capped with Sodium Citrate per Dr. Lorrene Reid, HIT panel pending.

## 2014-07-01 NOTE — Procedures (Signed)
I have personally attended this patient's dialysis session.   Dialysis #2 for new ESRD NO HEPARIN given issues over past 18 hours with bleeding (and interestingly pt's PTT is still slightly elevated with heparin off since 2040 last night)  4K bath with K of 3.9 UF goal 2-3 liters.  Jamal Maes, MD [facility] [phone] Pager 07/01/2014, 2:28 PM

## 2014-07-01 NOTE — Progress Notes (Signed)
[facility] Rounding Note Subjective:  Getting 2nd HD for new ESRD today Had bleeding from his incisions and TDC site yesterday All heparin is off [facility] is following closely HIT panel pending (platelets have dropped)  Objective Vital signs in last 24 hours: Filed Vitals:   07/01/14 1300 07/01/14 1330 07/01/14 1400 07/01/14 1430  BP: 134/85 129/73 119/77 121/62  Pulse: 100 102 99 98  Temp:      TempSrc:      Resp:      Height:      Weight:      SpO2:       Weight change: -0.85 kg (-1 lb 14 oz)  Intake/Output Summary (Last 24 hours) at 07/01/14 1431 Last data filed at 07/01/14 0938  Gross per 24 hour  Intake    240 ml  Output    500 ml  Net   -260 ml   Physical Exam:  BP 121/62 mmHg  Pulse 98  Temp(Src) 98.3 F (36.8 C) (Oral)  Resp 19  Ht 6\' 1"  (1.854 m)  Wt 78.8 kg (173 lb 11.6 oz)  BMI 22.92 kg/m2  SpO2 99%  No JVD No obvious bleeding at this time Lungs grossly clear  Heart sounds distant S1S2 No S3 and no pericardial rub Abd soft, no focal tenderness at this time Trace edema LE's Left upper arm AVF with + bruit. Some arm edema. ACE wrap on forearm (by [facility])  Labs:  Recent Labs Lab 06/25/14 0745 06/26/14 0758 06/27/14 0530 06/29/14 0908 06/30/14 0236 07/01/14 0510  NA 140 141 142 140 137 142  K 4.4 4.3 4.4 4.4 4.3 3.9  CL 112 113* 114* 116* 113* 114*  CO2 20 18* 19 17* 17* 23  GLUCOSE 92 84 73 86 128* 94  BUN 73* 67* 72* 68* 67* 48*  CREATININE 9.09* 9.32* 9.51* 9.19* 9.14* 7.21*  CALCIUM 7.7* 7.8* 8.1* 7.4* 7.3* 7.2*  PHOS  --   --   --   --  6.7* 5.1*     Recent Labs Lab 06/30/14 0236 07/01/14 0510  ALBUMIN 1.1* 1.0*    Recent Labs Lab 06/28/14 0641 06/29/14 0908 06/30/14 0236 07/01/14 0510  WBC 5.6 5.5 6.9 5.9  HGB 7.9* 7.4* 8.7* 7.2*  HCT 23.9* 22.8* 26.1* 21.4*  MCV 90.5 92.3 94.6 91.5  PLT 242 212 287 104*    Iron Studies:  No results for input(s): IRON, TIBC, TRANSFERRIN, FERRITIN in the last 168 hours. Lab  Results  Component Value Date   INR 1.93* 07/01/2014   INR 1.98* 06/30/2014   INR 1.89* 06/30/2014   Studies/Results: Dg Chest Port 1 View  06/29/2014   CLINICAL DATA:  Postop film 4 diet diatek catheter insertion.  EXAM: PORTABLE CHEST - 1 VIEW  COMPARISON:  02/09/2014  FINDINGS: New right internal jugular dual-lumen central venous line has its distal port projecting in the right atrium.  No pneumothorax.  Moderate left and small right effusions. There is centrally predominant interstitial thickening consistent with mild edema.  Cardiac silhouette is mildly enlarged. No mediastinal or hilar masses.  IMPRESSION: 1. Dual-lumen tunneled right internal jugular central venous catheter has its distal catheter tip projecting in the right atrium. No pneumothorax. 2. Mild central interstitial pulmonary edema and moderate left and small right effusions.   Electronically Signed   By: Lajean Manes M.D.   On: 06/29/2014 15:13   Medications: . sodium chloride 10 mL/hr at 06/29/14 1137   . abacavir  600 mg Oral Daily  .  amLODipine  5 mg Oral QHS  . atorvastatin  40 mg Oral q1800  . calcium acetate  1,334 mg Oral TID WC  . [START ON 07/02/2014] darbepoetin (ARANESP) injection - DIALYSIS  100 mcg Intravenous Q Thu-HD  . dolutegravir  50 mg Oral Daily  . lamiVUDine  50 mg Oral Daily  . multivitamin  1 tablet Oral QHS  . pantoprazole (PROTONIX) IV  40 mg Intravenous Q12H  . sodium chloride  3 mL Intravenous Q12H  . vancomycin  1,000 mg Intravenous On Call to OR    Background: 59 y.o. 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 Prior renal biopsy 2015 with diffuse proliferative lupus and a component of collapsing FSGS. Was on steroids and Cellcept in Dec when admitted here with  pancytopenia and cellcept was held. Was on prednisone only after hospital discharge in December (discharge creatinine 2.77) then lost to local followup while staying in [place]. Recently returned, admitted 3/21 with abdominal pain, found to have progressive renal failure (creatinine close to 10) and deemed ESRD by Dr. Justin Mend.   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 Dr. Justin Mend has deemed ESRD. Permanent access placed 06/29/14  [facility] + 1st stage BVT AVF (Dr.Early). HD#1 3/29 Getting HD #2 today. Will get #3 tomorrow. CLIP process initiated (anticipate will go to [place] -  as his zip code is [phone]) but we still do not have a definite assignment for him yet.   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 VDRA not indicated. Phos 6.7. Started binder.                                                         HTN controlled at present  Anemia ESA started Aranesp 100 QTuesday. TSat 41 no iron needed. (Had transfusion "recently - not this admission).   Depression - declines treatment  Metabolic acidosis improving with HD  Recent DVT with supratherapeutic INR on admission - now in therapeutic range. Bleeding yesterday - heparin stopped.   Achalasia - dysphagia 3 diet. Probable Botox   HIV - continue meds. Outpt ID followup.  HTN - blood pressures are soft. Stop amlodipine to allow for UF with HD.  Small pericardial effusion on chest CT, moderate on ECHO 3/23 and cannot exclude some tamponade physiology. Will need to watch BP carefully with hemodialysis  Kenneth Maes, MD [facility] [phone] pager 07/01/2014, 2:31 PM

## 2014-07-02 ENCOUNTER — Encounter (HOSPITAL_COMMUNITY)
Admission: AD | Disposition: A | Discharge status: Home Health | Payer: Self-pay | Source: Ambulatory Visit | Attending: Family Medicine

## 2014-07-02 ENCOUNTER — Other Ambulatory Visit: Payer: Self-pay | Admitting: *Deleted

## 2014-07-02 ENCOUNTER — Telehealth: Payer: Self-pay | Admitting: Vascular Surgery

## 2014-07-02 ENCOUNTER — Ambulatory Visit: Payer: BC Managed Care – PPO | Admitting: Family Medicine

## 2014-07-02 ENCOUNTER — Encounter (HOSPITAL_COMMUNITY): Payer: Self-pay

## 2014-07-02 DIAGNOSIS — Z4931 Encounter for adequacy testing for hemodialysis: Secondary | ICD-10-CM

## 2014-07-02 DIAGNOSIS — R1310 Dysphagia, unspecified: Secondary | ICD-10-CM

## 2014-07-02 DIAGNOSIS — D696 Thrombocytopenia, unspecified: Secondary | ICD-10-CM

## 2014-07-02 DIAGNOSIS — N186 End stage renal disease: Secondary | ICD-10-CM

## 2014-07-02 HISTORY — PX: ESOPHAGOGASTRODUODENOSCOPY: SHX5428

## 2014-07-02 LAB — RENAL FUNCTION PANEL
Albumin: 1.1 g/dL — ABNORMAL LOW (ref 3.5–5.2)
Anion gap: 7 (ref 5–15)
BUN: 28 mg/dL — ABNORMAL HIGH (ref 6–23)
CHLORIDE: 110 mmol/L (ref 96–112)
CO2: 23 mmol/L (ref 19–32)
Calcium: 7.4 mg/dL — ABNORMAL LOW (ref 8.4–10.5)
Creatinine, Ser: 5.45 mg/dL — ABNORMAL HIGH (ref 0.50–1.35)
GFR calc Af Amer: 12 mL/min — ABNORMAL LOW (ref >90)
GFR calc non Af Amer: 11 mL/min — ABNORMAL LOW (ref >90)
Glucose, Bld: 89 mg/dL (ref 70–99)
Phosphorus: 4.1 mg/dL (ref 2.3–4.6)
Potassium: 4.1 mmol/L (ref 3.5–5.1)
Sodium: 140 mmol/L (ref 135–145)

## 2014-07-02 LAB — CBC
HCT: 24.1 % — ABNORMAL LOW (ref 39.0–52.0)
Hemoglobin: 7.8 g/dL — ABNORMAL LOW (ref 13.0–17.0)
MCH: 29.9 pg (ref 26.0–34.0)
MCHC: 32.4 g/dL (ref 30.0–36.0)
MCV: 92.3 fL (ref 78.0–100.0)
PLATELETS: 78 10*3/uL — AB (ref 150–400)
RBC: 2.61 MIL/uL — ABNORMAL LOW (ref 4.22–5.81)
RDW: 16.1 % — AB (ref 11.5–15.5)
WBC: 7.9 10*3/uL (ref 4.0–10.5)

## 2014-07-02 LAB — PROTIME-INR
INR: 1.65 — ABNORMAL HIGH (ref 0.00–1.49)
Prothrombin Time: 19.7 seconds — ABNORMAL HIGH (ref 11.6–15.2)

## 2014-07-02 SURGERY — EGD (ESOPHAGOGASTRODUODENOSCOPY)
Anesthesia: Moderate Sedation

## 2014-07-02 MED ORDER — MIDAZOLAM HCL 10 MG/2ML IJ SOLN
INTRAMUSCULAR | Status: DC | PRN
Start: 2014-07-02 — End: 2014-07-02
  Administered 2014-07-02 (×2): 2 mg via INTRAVENOUS

## 2014-07-02 MED ORDER — SODIUM CHLORIDE 0.9 % IJ SOLN
100.0000 [IU] | Freq: Once | INTRAMUSCULAR | Status: AC
  Administered 2014-07-02: 100 [IU] via SUBMUCOSAL
  Filled 2014-07-02: qty 100

## 2014-07-02 MED ORDER — DARBEPOETIN ALFA 100 MCG/0.5ML IJ SOSY: 100.0000 ug | PREFILLED_SYRINGE | INTRAMUSCULAR | Status: DC

## 2014-07-02 MED ORDER — ABACAVIR SULFATE 300 MG PO TABS: 600.0000 mg | ORAL_TABLET | Freq: Every day | ORAL | Status: DC

## 2014-07-02 MED ORDER — SODIUM CHLORIDE 0.9 % IJ SOLN
INTRAMUSCULAR | Status: AC
  Filled 2014-07-02: qty 10

## 2014-07-02 MED ORDER — DOLUTEGRAVIR SODIUM 50 MG PO TABS: 50.0000 mg | ORAL_TABLET | Freq: Every day | ORAL | Status: DC

## 2014-07-02 MED ORDER — LAMIVUDINE 10 MG/ML PO SOLN: 50.0000 mg | Freq: Every day | ORAL | Status: DC

## 2014-07-02 MED ORDER — FENTANYL CITRATE 0.05 MG/ML IJ SOLN
INTRAMUSCULAR | Status: DC | PRN
  Administered 2014-07-02: 25 ug via INTRAVENOUS

## 2014-07-02 MED ORDER — MIDAZOLAM HCL 5 MG/ML IJ SOLN
INTRAMUSCULAR | Status: AC
Start: 2014-07-02 — End: 2014-07-02
  Filled 2014-07-02: qty 2

## 2014-07-02 MED ORDER — FENTANYL CITRATE 0.05 MG/ML IJ SOLN
INTRAMUSCULAR | Status: AC
Start: 2014-07-02 — End: 2014-07-02
  Filled 2014-07-02: qty 2

## 2014-07-02 MED ORDER — SODIUM CHLORIDE 0.9 % IV SOLN
INTRAVENOUS | Status: DC
  Administered 2014-07-02: 500 mL via INTRAVENOUS

## 2014-07-02 MED ORDER — RENA-VITE PO TABS: 1.0000 | ORAL_TABLET | Freq: Every day | ORAL | Status: DC

## 2014-07-02 MED ORDER — BUTAMBEN-TETRACAINE-BENZOCAINE 2-2-14 % EX AERO
INHALATION_SPRAY | CUTANEOUS | Status: DC | PRN
  Administered 2014-07-02: 2 via TOPICAL

## 2014-07-02 NOTE — Progress Notes (Signed)
Family Medicine Teaching Service Daily Progress Note Intern Pager: [phone]  Patient name: Kenneth [name] Medical record number: [id number] Date of birth: 1956-02-12 Age: 59 y.o. Gender: male  Primary Care Provider: Cordelia Poche, MD Consultants: ID Code Status: Full  Pt Overview and Major Events to Date:  3/21: Admitted for lab abnormalities and concerning physical exam 3/23: Patient agreeable to HD 3/24: Barium swallow completed 3/25: INR now in therapeutic range; N/V 3/26: EGD demonstrates achalasia vs severe dysmotility 3/28: Dialysis catheter and AV fistula by vascular 3/29: First round of HD today; started bleeding from RIJ and L. Arm fistula site 3/30: reoccurrence of N/V 3/31: GI to do Botox injection of LES; HD x3 today  Assessment and Plan: DELAYNE PETRA is a 59 y.o. male presenting with cough and abdominal distension in clinic, found to have supratherapeutic INR to ~8 . PMH is significant for abdominal pain, anemia, DVT, CKD II, ED, HTN, hemorrhoids, HIV, HLD, lupus, PVCs, protein calorie malnutrition.  ESRD: Most recent creatinine prior to admission 2.77 and GFR 28 03/20/14. However on admission Cr 9.56 and BUN 64. Patient is not making much urine. Progression of renal disease. 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 Renal US shows R. Surgical nephrectomy. L kidney with medical renal disease. Now receiving HD (started 3/29). - trend BMP - Cr improved s/p HD - nephrology consulted; appreciate recs - HD again today; CLIP process initiated. Awaiting definite assignment  - strict I/Os - first A-V fistula/graft procedure 3/29 was succesful without complications - stage 2 after basilic vein dilates  - RIJ catheter placed for HD while fistula matures - HD started 3/29; patient with good dieurisis  -scheduled HD again today (x3)   Nausea/Vomiting: Recurrent issue for patient. Feels as though food stuck in throat. CT with chronic esophageal dilation  with fluid and debris within. Patient with probably achalasia.  - prn IV Zofran and Phenergan  - EGD demonstrated retained food, suggestive of achalasia vs severe dysmotility - GI following; appreciate recs - will have  Botox injection today - NPO for GI procedure; add diet back per GI recs (dysphagia diet 3)  Recent DVT: Supratherapeutic INR now resolved. INR in clinic >8. Value later verified at 6.3. On coumadin for DVT in January in [place], no records for verification of details. Some concern in clinic of poor understanding of meds (taking when told to stop taking) / lost to f/u. No active bleeding this AM.   - Continue to monitor PT/INR - today 1.93 - Hold all anticoagulation including heparin due bleeding   - Re-evaluate patient need for treatment; at this time patient does not need any more theraupetic anticoagulation. Has received 3 months worth of treatment. Appears to have been unprovoked DVT  -Still awaiting records -SCDs only recent bleeding.   Thrombocytopenia: Plts dropped from 287->104->78 today. All prior labs plts >200.  Could just be due to recent surgical procedure but with >50% drop and recent bleeding this is concerning. Does have hematoma on L. Arm. - HIT panel pending; Started heparin on 3/26 and discontinued on 3/39.   -score 6 which correlates to high risk.  - continue to monitor for ongoing bleeding - holding heparin and pharmacologic anticoagulation - low threshold for platelet transfusion if continues to bleed  Fluid overload: Unclear etiology. No reported dyspnea. Lung exam mild decreased aeration. Reports LE edema is somewhat chronic. Albumin 1.1. BNP 197.  Believe patient to be intracellularly deplete but with fluid overload. CT chest with small  bilateral effusions and small pericardial effusion. Echo showing grade 1 diastolic dysfunction with normal EF. Stable and improved with HD.  - encourage PO hydration - HD to help with fluid status - PT/OT eval - continue  to evaluate and treat -  HHPT  Depression: Believe patient is suffering from depression but patient declines wanting treatment at this time. This will likely affect health outcomes if not treated appropriately. - Will continue to encourage and support patient.   Anemia: Patient reports previously having blood transfusion about a month ago for anemia. He denies bleeding. Says they did not find a source for low Hbg. Denies any recent colonoscopy. Renal insufficiency likely playing a factor and also anemia of chronic disease. FH of colon cancer. Hbg baseline appears to be 8-10.  - Hbg 7.8 today; stable - ESA started 3/23 Aranesp 100 QTuesday - consider transfusing if Hbg <7 and/or patient symptomatic  HIV: Unclear what medications he is taking from home med list as patient is unclear about this in clinic. Previously seen by Dr [name] (ID). States he was taking his HIV medications. Viral load and CD4 count appropriate. No concern at this time for HIV related opportunistic infections.  - ID signed off .> outpatient follow-up arranged.  - continue HAART therapy   HTN: Elevated this morning but soft pressures during HD.  - Continue home norvasc - HD today to help with BP control  Lupus: Recent dx per patient.   HLD - Continued home atorvastatin  FEN/GI: dysphagia 3 diet; PPI BID Prophylaxis: SCDs  Disposition: Continue current management; pending improvement of multiple medical conditions.   Subjective:  Patient doing well this morning; more comfortable. He has not had any further episodes of bleeding or n/v. Patient was made NPO yesterday and is wanting to eat. Informed patient of possible GI procedure today and that is why he is NPO. Patient has mild pain in LUE with palpation and movement.   Objective: Temp:  [98.3 F (36.8 C)-99.9 F (37.7 C)] 99.9 F (37.7 C) (03/31 0538) Pulse Rate:  [79-104] 79 (03/30 2157) Resp:  [16-19] 18 (03/30 2157) BP: (104-152)/(55-97) 152/97 mmHg (03/31  0538) SpO2:  [99 %-100 %] 100 % (03/30 2157) Weight:  [167 lb 8.8 oz (76 kg)-173 lb 11.6 oz (78.8 kg)] 167 lb 12.3 oz (76.1 kg) (03/31 0538) Physical Exam: General: Comfortable, NAD, well-appearing, awake HEENT: AT/[place], sclera clear, EOMI, MMM Cardiovascular: RRR, no m/r/g Respiratory: CTAB, normal effort, no crackles appreciated Abdomen: Soft, nontender, nondistended, no obvious organomegaly, +BS Extremities: no edema in BLE, no asymmetry or calf tenderness, LUE with swelling, bruising, and hemotoma. RUE with swelling due to IV infiltration. Palpable radial pulse. Bruit palpable in upper arm.  Skin: No rash or cyanosis. WWP. Neuro: Awake, alert, grossly nonfocal, moves all extremities normally   Intake/Output Summary (Last 24 hours) at 07/02/14 0804 Last data filed at 07/02/14 [id number]  Gross per 24 hour  Intake    120 ml  Output   3473 ml  Net  -3353 ml   Laboratory: Results for orders placed or performed during the hospital encounter of 06/22/14 (from the past 24 hour(s))  Protime-INR     Status: Abnormal   Collection Time: 07/02/14  6:40 AM  Result Value Ref Range   Prothrombin Time 19.7 (H) 11.6 - 15.2 seconds   INR 1.65 (H) 0.00 - 1.49  CBC     Status: Abnormal (Preliminary result)   Collection Time: 07/02/14  6:40 AM  Result Value Ref Range  WBC 7.9 4.0 - 10.5 K/uL   RBC 2.61 (L) 4.22 - 5.81 MIL/uL   Hemoglobin 7.8 (L) 13.0 - 17.0 g/dL   HCT 24.1 (L) 39.0 - 52.0 %   MCV 92.3 78.0 - 100.0 fL   MCH 29.9 26.0 - 34.0 pg   MCHC 32.4 30.0 - 36.0 g/dL   RDW 16.1 (H) 11.5 - 15.5 %   Platelets PENDING 150 - 400 K/uL  Renal function panel     Status: Abnormal   Collection Time: 07/02/14  6:46 AM  Result Value Ref Range   Sodium 140 135 - 145 mmol/L   Potassium 4.1 3.5 - 5.1 mmol/L   Chloride 110 96 - 112 mmol/L   CO2 23 19 - 32 mmol/L   Glucose, Bld 89 70 - 99 mg/dL   BUN 28 (H) 6 - 23 mg/dL   Creatinine, Ser 5.45 (H) 0.50 - 1.35 mg/dL   Calcium 7.4 (L) 8.4 - 10.5 mg/dL    Phosphorus 4.1 2.3 - 4.6 mg/dL   Albumin 1.1 (L) 3.5 - 5.2 g/dL   GFR calc non Af Amer 11 (L) >90 mL/min   GFR calc Af Amer 12 (L) >90 mL/min   Anion gap 7 5 - 15    Imaging/Diagnostic Tests: Ct Chest and Abdomen Pelvis Wo Contrast 3/21:  CT CHEST IMPRESSION  1. Moderate degradation, secondary to lack of contrast, paucity of fat, and extent of anasarca. 2. Small bilateral pleural effusions with adjacent subsegmental atelectasis. 3. New pericardial effusion, small. 4. Chronic esophageal dilatation with fluid and debris within. This suggests a component of obstruction at the gastroesophageal junction and/or esophageal dysmotility.   CT ABDOMEN AND PELVIS IMPRESSION  1. Development of moderate abdominal pelvic ascites. 2. Apparent irregular hepatic capsule which could be CT artifactual. Interval development of cirrhosis since 03/16/2014 is felt unlikely. Correlate with risk factors. 3. Cholelithiasis. 4. Anasarca.    Renal ultrasound 3/22: 1. Right nephrectomy and chronic left medical renal disease. 2. No hydronephrosis. 3. Known ascites.  Barium swallow 3/24: 1. Stable exam since 2007. 2. Esophageal dysmotility and dilation, but no akinesia typical of achalasia. Non-fixed narrowing at the GE junction, at least partly related to remote anti reflux surgery.  EGD 3/26: Retained food in esophagus, relieved endoscopically, suspect achalasia or severe motility disorder  Katheren Shams, DO 07/02/2014, 8:04 AM PGY-1, [place] Intern pager: [phone], text pages welcome

## 2014-07-02 NOTE — Progress Notes (Addendum)
[facility] Rounding Note Subjective:   Got botox injection of lower esophageal sphincter for achalasia  For dialysis again today and then TTS (we THINK he will be TTS at [facility] but we aren't quite sure yet - not verified) Has had no further bleeding Rec'd HD yesterday without heparin and catheter was blocked with citrate HIT panel pending   Objective Vital signs in last 24 hours: Filed Vitals:   07/02/14 1000 07/02/14 1005 07/02/14 1010 07/02/14 1032  BP: 136/89  149/90 148/86  Pulse: 97 95 95   Temp:    99 F (37.2 C)  TempSrc:    Oral  Resp: 15 11 10 16   Height:      Weight:      SpO2: 98% 98% 98% 100%   Weight change: -1 kg (-2 lb 3.3 oz)  Intake/Output Summary (Last 24 hours) at 07/02/14 1057 Last data filed at 07/02/14 [id number]  Gross per 24 hour  Intake      0 ml  Output   3073 ml  Net  -3073 ml   Physical Exam:  BP 148/86 mmHg  Pulse 95  Temp(Src) 99 F (37.2 C) (Oral)  Resp 16  Ht 6\' 1"  (1.854 m)  Wt 76.1 kg (167 lb 12.3 oz)  BMI 22.14 kg/m2  SpO2 100%  No JVD No obvious bleeding at this time Lungs grossly clear  Heart sounds distant S1S2 No S3 and no pericardial rub Abd soft, no focal tenderness at this time Trace edema LE's Left upper arm AVF with + bruit. Bruising of the arm.  Some arm edema.   Labs:  Recent Labs Lab 06/26/14 0758 06/27/14 0530 06/29/14 0908 06/30/14 0236 07/01/14 0510 07/02/14 0646  NA 141 142 140 137 142 140  K 4.3 4.4 4.4 4.3 3.9 4.1  CL 113* 114* 116* 113* 114* 110  CO2 18* 19 17* 17* 23 23  GLUCOSE 84 73 86 128* 94 89  BUN 67* 72* 68* 67* 48* 28*  CREATININE 9.32* 9.51* 9.19* 9.14* 7.21* 5.45*  CALCIUM 7.8* 8.1* 7.4* 7.3* 7.2* 7.4*  PHOS  --   --   --  6.7* 5.1* 4.1     Recent Labs Lab 06/30/14 0236 07/01/14 0510 07/02/14 0646  ALBUMIN 1.1* 1.0* 1.1*    Recent Labs Lab 06/29/14 0908 06/30/14 0236 07/01/14 0510 07/02/14 0640  WBC 5.5 6.9 5.9 7.9  HGB 7.4* 8.7* 7.2* 7.8*  HCT  22.8* 26.1* 21.4* 24.1*  MCV 92.3 94.6 91.5 92.3  PLT 212 287 104* 78*     No results for input(s): IRON, TIBC, TRANSFERRIN, FERRITIN in the last 168 hours. Lab Results  Component Value Date   INR 1.65* 07/02/2014   INR 1.93* 07/01/2014   INR 1.98* 06/30/2014    Medications: . sodium chloride 10 mL/hr at 06/29/14 1137  . sodium chloride 500 mL (07/02/14 0911)   . abacavir  600 mg Oral Daily  . amLODipine  5 mg Oral QHS  . atorvastatin  40 mg Oral q1800  . calcium acetate  1,334 mg Oral TID WC  . [START ON 07/07/2014] darbepoetin (ARANESP) injection - DIALYSIS  100 mcg Intravenous Q Tue-HD  . dolutegravir  50 mg Oral Daily  . lamiVUDine  50 mg Oral Daily  . multivitamin  1 tablet Oral QHS  . pantoprazole (PROTONIX) IV  40 mg Intravenous Q12H  . sodium chloride  3 mL Intravenous Q12H  . vancomycin  1,000 mg Intravenous On Call to OR  Background: 59 y.o. 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 Prior renal biopsy 2015 with diffuse proliferative lupus and a component of collapsing FSGS. Was on steroids and Cellcept in Dec when admitted here with pancytopenia and cellcept was held. Was on prednisone only after hospital discharge in December (discharge creatinine 2.77) then lost to local followup while staying in [place]. Recently returned, admitted 3/21 with abdominal pain, found to have progressive renal failure (creatinine close to 10) and deemed ESRD by Dr. Justin Mend.   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 Dr. Justin Mend has deemed ESRD. Permanent access placed 06/29/14  [facility] + 1st stage BVT AVF (Dr.Early). HD#1 3/29 HD #2 3/30 and HD #3 tomorrow. He has been CLIPPED to [facility], MWF, 2nd shift so will get HD here tomorrow.     S/p 1st stage BVT AVF 3/28. Will need 1 month f/u at [facility], then 2nd stage of BVT at their discretion.  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 VDRA not indicated. Started binder.  Phos has normalized.                                                      HTN controlled but would like to stop amlodipine to allow more room for UF at dialysis.  Anemia ESA started Aranesp 100 QTuesday. TSat 41 no iron needed. (Had transfusion "recently - not this admission).   Depression - declines treatment  Metabolic acidosis resolved with HD  Recent DVT with supratherapeutic INR on admission . Bleeding yesterday - heparin stopped.  Since had 3 months of treatment for DVT is to get no further anticoagulant.   Thrombocytopenia - HIT panel still pending. No heparin with HD. Block catheter with citrate, Platelets are down to 73K  Achalasia - s/p Botox of LES by Dr. Amedeo Plenty  HIV - continue meds. Outpt ID followup.  Small pericardial effusion on chest CT, moderate on ECHO 3/23 and cannot exclude some tamponade physiology. Will need to watch BP carefully with hemodialysis  Jamal Maes, MD [facility] [phone] pager 07/02/2014, 10:57 AM

## 2014-07-02 NOTE — Progress Notes (Signed)
07/02/2014 11:46 AM Hemodialysis Outpatient Note; this patient has been accepted at the [facility] on a Monday, Wednesday and Friday 2nd shift schedule. The center will begin treatment on Monday April 4, please have patient arrive at the center for 11:30 AM to sign paperwork and consents. Thank you. Gordy Savers

## 2014-07-02 NOTE — Interval H&P Note (Signed)
History and Physical Interval Note:  07/02/2014 9:16 AM  Kenneth [name]  has presented today for surgery, with the diagnosis of Achalasia  The various methods of treatment have been discussed with the patient and family. After consideration of risks, benefits and other options for treatment, the patient has consented to  Procedure(s) with comments: ESOPHAGOGASTRODUODENOSCOPY (EGD) (N/A) - with botox as a surgical intervention .  The patient's history has been reviewed, patient examined, no change in status, stable for surgery.  I have reviewed the patient's chart and labs.  Questions were answered to the patient's satisfaction.     Gilberto Stanforth C

## 2014-07-02 NOTE — Progress Notes (Signed)
Physical Therapy Treatment Patient Details Name: STYLES HENNESSEE MRN: [id number] DOB: April 24, 1955 Today's Date: 07/02/2014    History of Present Illness pt is a 59 y/o male admitted for abdominal distension, but on admission noted to have supratherapeutic INR over 8.  Work up continues.    PT Comments    Progressing well, gait stability improving.  Managing with HD.  Some concern over L elbow/arm pain and mild elbow swelling/contractur.  Left patient with homework to straighten arm over the weekend.  Follow Up Recommendations  Home health PT     Equipment Recommendations  None recommended by PT    Recommendations for Other Services       Precautions / Restrictions Precautions Precautions: Fall    Mobility  Bed Mobility Overal bed mobility: Modified Independent                Transfers Overall transfer level: Needs assistance Equipment used: None Transfers: Sit to/from Stand Sit to Stand: Supervision         General transfer comment: safe transition  Ambulation/Gait Ambulation/Gait assistance: Supervision Ambulation Distance (Feet): 800 Feet Assistive device: None Gait Pattern/deviations: Step-through pattern Gait velocity: prefers slower, but can increase speed appreciably to cue   General Gait Details: more steady today, some wandering, but noticeably better   Stairs            Wheelchair Mobility    Modified Rankin (Stroke Patients Only)       Balance Overall balance assessment: Needs assistance Sitting-balance support: No upper extremity supported Sitting balance-Leahy Scale: Good     Standing balance support: No upper extremity supported Standing balance-Leahy Scale: Fair                      Cognition Arousal/Alertness: Awake/alert Behavior During Therapy: WFL for tasks assessed/performed Overall Cognitive Status: Within Functional Limits for tasks assessed                      Exercises      General  Comments General comments (skin integrity, edema, etc.): Worked on attaining full L elbow extension sliding his arm out across a countertop.      Pertinent Vitals/Pain Pain Assessment: Faces Faces Pain Scale: Hurts even more Pain Location: L arm HD sites Pain Descriptors / Indicators: Moaning;Sore Pain Intervention(s): Monitored during session    Home Living                      Prior Function            PT Goals (current goals can now be found in the care plan section) Acute Rehab PT Goals Patient Stated Goal: stronger PT Goal Formulation: With patient Time For Goal Achievement: 07/09/14 Potential to Achieve Goals: Good Progress towards PT goals: Progressing toward goals    Frequency  Min 3X/week    PT Plan Current plan remains appropriate    Co-evaluation             End of Session   Activity Tolerance: Patient tolerated treatment well Patient left: in bed;with call bell/phone within reach     Time: 1657-1719 PT Time Calculation (min) (ACUTE ONLY): 22 min  Charges:  $Gait Training: 8-22 mins                    G Codes:      Charlann Wayne, Tessie Fass 07/02/2014, 5:30 PM  07/02/2014  Donnella Sham, [place] [phone]  (  pager)

## 2014-07-02 NOTE — H&P (View-Only) (Signed)
Patient ID: Kenneth [name], male   DOB: [birth year], 59 y.o.   MRN: [id number] Comfortable this morning. No further bleeding. Concerned that he has had not been allowed to eat.  Ace wrap removed from his left arm. No further bleeding from the vein exploration site above his wrist. Does have some bruising and hematoma. No bleeding from upper arm site or catheter.  Coagulopathy appears to have resolved. Will not follow actively. Will see the office in one month for fistula follow-up. Will need second stage basilic vein transposition if his basilic vein dilates nicely. Discussed with patient.

## 2014-07-02 NOTE — Telephone Encounter (Addendum)
-----   Message from Mena Goes, RN sent at 07/02/2014 10:43 AM EDT ----- Regarding: schedule   ----- Message -----    From: Gabriel Earing, PA-C    Sent: 07/02/2014   9:57 AM      To: [facility] Charge Pool  S/p #1 right IJ catheter insertion with SonoSite visualization, #2 left basilic vein stage I fistula 06/29/14.  F/u with Dr. Donnetta Hutching in 4 weeks with duplex of fistula.  Thanks, Aldona Bar  notified patient of vascular testing  on 08-11-14 3:30 and then 08-18-14 at 8:45 to see dr. early

## 2014-07-02 NOTE — Progress Notes (Signed)
[facility] Progress Note  Subjective: Patient developed recurrent of esophageal obstructive symptoms yesterday on soft mechanical diet. Did not have any forced regurgitation but felt that he could not swallow for several hours  Objective: Vital signs in last 24 hours: Temp:  [98.3 F (36.8 C)-99.9 F (37.7 C)] 98.9 F (37.2 C) (03/31 0906) Pulse Rate:  [79-106] 106 (03/31 0950) Resp:  [11-22] 17 (03/31 0950) BP: (104-172)/(55-125) 132/92 mmHg (03/31 0950) SpO2:  [99 %-100 %] 100 % (03/31 0950) Weight:  [76 kg (167 lb 8.8 oz)-78.8 kg (173 lb 11.6 oz)] 76.1 kg (167 lb 12.3 oz) (03/31 0538) Weight change: -1 kg (-2 lb 3.3 oz)   PE: Exam unchanged  Lab Results: Results for orders placed or performed during the hospital encounter of 06/22/14 (from the past 24 hour(s))  Protime-INR     Status: Abnormal   Collection Time: 07/02/14  6:40 AM  Result Value Ref Range   Prothrombin Time 19.7 (H) 11.6 - 15.2 seconds   INR 1.65 (H) 0.00 - 1.49  CBC     Status: Abnormal   Collection Time: 07/02/14  6:40 AM  Result Value Ref Range   WBC 7.9 4.0 - 10.5 K/uL   RBC 2.61 (L) 4.22 - 5.81 MIL/uL   Hemoglobin 7.8 (L) 13.0 - 17.0 g/dL   HCT 24.1 (L) 39.0 - 52.0 %   MCV 92.3 78.0 - 100.0 fL   MCH 29.9 26.0 - 34.0 pg   MCHC 32.4 30.0 - 36.0 g/dL   RDW 16.1 (H) 11.5 - 15.5 %   Platelets 78 (L) 150 - 400 K/uL  Renal function panel     Status: Abnormal   Collection Time: 07/02/14  6:46 AM  Result Value Ref Range   Sodium 140 135 - 145 mmol/L   Potassium 4.1 3.5 - 5.1 mmol/L   Chloride 110 96 - 112 mmol/L   CO2 23 19 - 32 mmol/L   Glucose, Bld 89 70 - 99 mg/dL   BUN 28 (H) 6 - 23 mg/dL   Creatinine, Ser 5.45 (H) 0.50 - 1.35 mg/dL   Calcium 7.4 (L) 8.4 - 10.5 mg/dL   Phosphorus 4.1 2.3 - 4.6 mg/dL   Albumin 1.1 (L) 3.5 - 5.2 g/dL   GFR calc non Af Amer 11 (L) >90 mL/min   GFR calc Af Amer 12 (L) >90 mL/min   Anion gap 7 5 - 15    Studies/Results: No results  found.    Assessment: Achalasia status post remote Heller myotomy with very poor to absent esophageal motility and recurrent stasis of food in the esophagus  Plan: Will repeat EGD with Botox injection of the lower esophageal sphincter to see if this helps with  esophageal clearance    Garlene Apperson C 07/02/2014, 9:54 AM

## 2014-07-02 NOTE — Progress Notes (Signed)
Patient ID: Kenneth [name], male   DOB: Dec 05, 1955, 59 y.o.   MRN: [id number] Comfortable this morning. No further bleeding. Concerned that he has had not been allowed to eat.  Ace wrap removed from his left arm. No further bleeding from the vein exploration site above his wrist. Does have some bruising and hematoma. No bleeding from upper arm site or catheter.  Coagulopathy appears to have resolved. Will not follow actively. Will see the office in one month for fistula follow-up. Will need second stage basilic vein transposition if his basilic vein dilates nicely. Discussed with patient.

## 2014-07-02 NOTE — Op Note (Signed)
[facility] [place], [address]   ENDOSCOPY PROCEDURE REPORT  PATIENT: Kenneth [name], Kenneth [name]  MR#: [id number] BIRTHDATE: 1955-07-23 , 58  yrs. old GENDER: male ENDOSCOPIST: [name], MD REFERRED BY: PROCEDURE DATE:  07/30/2014 PROCEDURE: ASA CLASS: INDICATIONS:  achalasia with recurrent esophageal obstruction MEDICATIONS: 4 mg Versed, 25 g fentanyl TOPICAL ANESTHETIC: Cetacaine spray  DESCRIPTION OF PROCEDURE: After the risks benefits and alternatives of the procedure were thoroughly explained, informed consent was obtained.  The Pentax Gastroscope [id number] endoscope was introduced through the mouth and advanced to the second portion of the duodenum , Without limitations.  The instrument was slowly withdrawn as the mucosa was fully examined.    esophagus very dilated and granular as before. Some old food particles and accumulated liquid in the dependent portions but not obstructing or interfering with visualization of the GE junction.. The stomach and duodenum appeared normal although there was some residual food in the stomach. Attention was directed toward the lower esophageal sphincter which was somewhat difficult to identify but estimated to be at about 43 cm from the incisors.  Botox was injected in each of 4 quadrants intramuscularly 25 units each for a total of 100 units.            The scope was then withdrawn from the patient and the procedure completed.  COMPLICATIONS: There were no immediate complications.  ENDOSCOPIC IMPRESSION: achalasia with persistent esophageal stasis status post Botox injection of the lower esophageal sphincter  RECOMMENDATIONS: Will resume diet at full liquids and see how symptoms respond.   REPEAT EXAM:  eSigned:  [name], MD 2014-07-30 10:00 AM    CC:  CPT CODES: ICD CODES:  The ICD and CPT codes recommended by this software are interpretations from the data that the clinical  staff has captured with the software.  The verification of the translation of this report to the ICD and CPT codes and modifiers is the sole responsibility of the health care institution and practicing physician where this report was generated.  [employer] will not be held responsible for the validity of the ICD and CPT codes included on this report.  AMA assumes no liability for data contained or not contained herein. CPT is a Designer, television/film set of the [employer].

## 2014-07-03 ENCOUNTER — Encounter (HOSPITAL_COMMUNITY): Payer: Self-pay | Admitting: Gastroenterology

## 2014-07-03 ENCOUNTER — Inpatient Hospital Stay (HOSPITAL_COMMUNITY): Payer: BC Managed Care – PPO

## 2014-07-03 LAB — RENAL FUNCTION PANEL
ANION GAP: 4 — AB (ref 5–15)
Albumin: <1 g/dL — ABNORMAL LOW (ref 3.5–5.2)
Albumin: <1 g/dL — ABNORMAL LOW (ref 3.5–5.2)
Anion gap: 3 — ABNORMAL LOW (ref 5–15)
BUN: 34 mg/dL — AB (ref 6–23)
BUN: 34 mg/dL — AB (ref 6–23)
CALCIUM: 7.1 mg/dL — AB (ref 8.4–10.5)
CALCIUM: 7.2 mg/dL — AB (ref 8.4–10.5)
CHLORIDE: 108 mmol/L (ref 96–112)
CO2: 26 mmol/L (ref 19–32)
CO2: 28 mmol/L (ref 19–32)
CREATININE: 5.99 mg/dL — AB (ref 0.50–1.35)
CREATININE: 6.01 mg/dL — AB (ref 0.50–1.35)
Chloride: 107 mmol/L (ref 96–112)
GFR calc Af Amer: 11 mL/min — ABNORMAL LOW (ref >90)
GFR calc non Af Amer: 9 mL/min — ABNORMAL LOW (ref >90)
GFR calc non Af Amer: 9 mL/min — ABNORMAL LOW (ref >90)
GFR, EST AFRICAN AMERICAN: 11 mL/min — AB (ref >90)
GLUCOSE: 103 mg/dL — AB (ref 70–99)
Glucose, Bld: 114 mg/dL — ABNORMAL HIGH (ref 70–99)
PHOSPHORUS: 3.8 mg/dL (ref 2.3–4.6)
Phosphorus: 3.9 mg/dL (ref 2.3–4.6)
Potassium: 4 mmol/L (ref 3.5–5.1)
Potassium: 4.1 mmol/L (ref 3.5–5.1)
Sodium: 137 mmol/L (ref 135–145)
Sodium: 139 mmol/L (ref 135–145)

## 2014-07-03 LAB — CBC
HCT: 19 % — ABNORMAL LOW (ref 39.0–52.0)
HCT: 17.7 % — ABNORMAL LOW (ref 39.0–52.0)
HCT: 30.3 % — ABNORMAL LOW (ref 39.0–52.0)
Hemoglobin: 6.2 g/dL — CL (ref 13.0–17.0)
Hemoglobin: 5.8 g/dL — CL (ref 13.0–17.0)
Hemoglobin: 10 g/dL — ABNORMAL LOW (ref 13.0–17.0)
MCH: 30.7 pg (ref 26.0–34.0)
MCH: 30.5 pg (ref 26.0–34.0)
MCH: 29.4 pg (ref 26.0–34.0)
MCHC: 32.6 g/dL (ref 30.0–36.0)
MCHC: 32.8 g/dL (ref 30.0–36.0)
MCHC: 33 g/dL (ref 30.0–36.0)
MCV: 94.1 fL (ref 78.0–100.0)
MCV: 93.2 fL (ref 78.0–100.0)
MCV: 89.1 fL (ref 78.0–100.0)
Platelets: 63 10*3/uL — ABNORMAL LOW (ref 150–400)
Platelets: 64 10*3/uL — ABNORMAL LOW (ref 150–400)
Platelets: 57 10*3/uL — ABNORMAL LOW (ref 150–400)
RBC: 2.02 MIL/uL — ABNORMAL LOW (ref 4.22–5.81)
RBC: 1.9 MIL/uL — ABNORMAL LOW (ref 4.22–5.81)
RBC: 3.4 MIL/uL — AB (ref 4.22–5.81)
RDW: 16.3 % — AB (ref 11.5–15.5)
RDW: 16.5 % — ABNORMAL HIGH (ref 11.5–15.5)
RDW: 16.9 % — AB (ref 11.5–15.5)
WBC: 7.8 10*3/uL (ref 4.0–10.5)
WBC: 7.5 10*3/uL (ref 4.0–10.5)
WBC: 8.3 10*3/uL (ref 4.0–10.5)

## 2014-07-03 LAB — VANCOMYCIN, RANDOM: Vancomycin Rm: 29.7 ug/mL

## 2014-07-03 LAB — PREPARE RBC (CROSSMATCH)

## 2014-07-03 LAB — PROTIME-INR
INR: 1.75 — ABNORMAL HIGH (ref 0.00–1.49)
Prothrombin Time: 20.6 seconds — ABNORMAL HIGH (ref 11.6–15.2)

## 2014-07-03 LAB — ABO/RH: ABO/RH(D): O POS

## 2014-07-03 MED ORDER — VANCOMYCIN HCL IN DEXTROSE 1-5 GM/200ML-% IV SOLN
1000.0000 mg | INTRAVENOUS | Status: DC
  Administered 2014-07-06: 1000 mg via INTRAVENOUS
  Filled 2014-07-03: qty 200

## 2014-07-03 MED ORDER — ACETAMINOPHEN 325 MG PO TABS
ORAL_TABLET | ORAL | Status: AC
  Filled 2014-07-03: qty 2

## 2014-07-03 MED ORDER — ACETAMINOPHEN 325 MG PO TABS
650.0000 mg | ORAL_TABLET | Freq: Four times a day (QID) | ORAL | Status: DC | PRN
  Administered 2014-07-03: 650 mg via ORAL
  Filled 2014-07-03: qty 2

## 2014-07-03 MED ORDER — SODIUM CHLORIDE 0.9 % IV SOLN: Freq: Once | INTRAVENOUS | Status: DC

## 2014-07-03 NOTE — Progress Notes (Signed)
CRITICAL VALUE ALERT  Critical value received:  Hgb 6.2  Date of notification:  07/03/2014  Time of notification:  7:41 AM  Critical value read back:Yes.    Nurse who received alert:  Roberto Scales  MD notified (1st page):  Family Medicine Resident   Time of first page:  7:44 AM  MD notified (2nd page):  Time of second page:  Responding MD:  Family Medicine Resident   Time MD responded:  7:44 AM

## 2014-07-03 NOTE — Procedures (Signed)
I have personally attended this patient's dialysis session.   No heparin and HIT panel still pending Temp to 101 - blood cultures drawn and will start empiric vanco Hb down to 5.8 - will transfuse 2 units on HD  Jamal Maes, MD [place] Pager 07/03/2014, 8:55 AM

## 2014-07-03 NOTE — Progress Notes (Addendum)
[facility] Rounding Note Subjective:   Got botox injection of lower esophageal sphincter for achalasia yesterday On dialysis today - CLIPPED to [facility]) MWF Temp spike to 101 - blood cultures sent/empiric vanco Hb drop to 5.8 - will give 2 units on HD Says his arm is sore, otherwise feels "OK"  Objective Vital signs in last 24 hours: Filed Vitals:   07/03/14 0712 07/03/14 0717 07/03/14 0730 07/03/14 0800  BP: 143/80 136/76 139/81 134/84  Pulse: 92 88 92 95  Temp: 101.1 F (38.4 C)  99 F (37.2 C)   TempSrc: Oral  Oral   Resp: 18     Height:      Weight: 78.2 kg (172 lb 6.4 oz)     SpO2: 98%      Weight change: -0.7 kg (-1 lb 8.7 oz)  Intake/Output Summary (Last 24 hours) at 07/03/14 0838 Last data filed at 07/02/14 1300  Gross per 24 hour  Intake    240 ml  Output      0 ml  Net    240 ml   Physical Exam:  BP 134/84 mmHg  Pulse 95  Temp(Src) 99 F (37.2 C) (Oral)  Resp 18  Ht 6\' 1"  (1.854 m)  Wt 78.2 kg (172 lb 6.4 oz)  BMI 22.75 kg/m2  SpO2 98%  Seen on dialysis Comfortable No JVD TDC site not tender/dsg intact No obvious bleeding  Lungs grossly clear  Heart sounds distant S1S2 No S3 and no pericardial rub Abd soft, no focal tenderness at this time Trace edema LE's Left arm edema nearly to shoulder including hand AVF with + bruit.    Labs:  Recent Labs Lab 06/27/14 0530 06/29/14 0908 06/30/14 0236 07/01/14 0510 07/02/14 0646 07/03/14 0616 07/03/14 0727  NA 142 140 137 142 140 137 139  K 4.4 4.4 4.3 3.9 4.1 4.0 4.1  CL 114* 116* 113* 114* 110 108 107  CO2 19 17* 17* 23 23 26 28   GLUCOSE 73 86 128* 94 89 114* 103*  BUN 72* 68* 67* 48* 28* 34* 34*  CREATININE 9.51* 9.19* 9.14* 7.21* 5.45* 5.99* 6.01*  CALCIUM 8.1* 7.4* 7.3* 7.2* 7.4* 7.1* 7.2*  PHOS  --   --  6.7* 5.1* 4.1 3.9 3.8     Recent Labs Lab 07/02/14 0646 07/03/14 0616 07/03/14 0727  ALBUMIN 1.1* <1.0* <1.0*    Recent Labs Lab 07/01/14 0510  07/02/14 0640 07/03/14 0616 07/03/14 0727  WBC 5.9 7.9 7.8 7.5  HGB 7.2* 7.8* 6.2* 5.8*  HCT 21.4* 24.1* 19.0* 17.7*  MCV 91.5 92.3 94.1 93.2  PLT 104* 78* 63* 64*     No results for input(s): IRON, TIBC, TRANSFERRIN, FERRITIN in the last 168 hours. Lab Results  Component Value Date   INR 1.75* 07/03/2014   INR 1.65* 07/02/2014   INR 1.93* 07/01/2014    Medications: . sodium chloride 10 mL/hr at 06/29/14 1137   . sodium chloride   Intravenous Once  . abacavir  600 mg Oral Daily  . acetaminophen      . atorvastatin  40 mg Oral q1800  . calcium acetate  1,334 mg Oral TID WC  . [START ON 07/08/2014] darbepoetin (ARANESP) injection - DIALYSIS  100 mcg Intravenous Q Wed-HD  . dolutegravir  50 mg Oral Daily  . lamiVUDine  50 mg Oral Daily  . multivitamin  1 tablet Oral QHS  . pantoprazole (PROTONIX) IV  40 mg Intravenous Q12H  . sodium chloride  3 mL Intravenous Q12H  . vancomycin  1,000 mg Intravenous On Call to OR    Background: 59 y.o. 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 Prior renal biopsy 2015 with diffuse proliferative lupus and a component of collapsing FSGS. Was on steroids and Cellcept in Dec when admitted here with pancytopenia and cellcept was held. Was on prednisone only after hospital discharge in December (discharge creatinine 2.77) then lost to local followup while staying in [place]. Recently returned, admitted 3/21 with abdominal pain, found to have progressive renal failure (creatinine close to 10) and deemed ESRD by Dr. Justin Mend.   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 Dr. Justin Mend has deemed ESRD. Permanent access placed 06/29/14  [facility] + 1st stage BVT AVF (Dr.Early). HD#1 3/29 HD #2 3/30 and HD #3  today. He has been CLIPPED to [facility], MWF, 2nd shift   Fever - temp to 101. Worrisome since new dialysis catheter. Blood cultures done. Empiric vanco.    S/p 1st stage BVT AVF 3/28. Will need 1 month f/u at [facility], then 2nd stage of BVT at their discretion.  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 VDRA not indicated. Started binder.  Phos has normalized.                                                      HTN controlled but would like to stop amlodipine to allow more room for UF at dialysis.  Anemia ESA started Aranesp 100 QTuesday. TSat 41 no iron needed. (Had transfusion "recently - not this admission).Hb down to 5.8. Transfuse 2 units on dialysis   Depression - declines treatment  Recent DVT with supratherapeutic INR on admission . Heparin stopped due to bleeding from catheter and surgical site. .  Since had 3 months of treatment for DVT is to get no further anticoagulant.   Thrombocytopenia - HIT panel still pending. No heparin with HD. Block catheter with citrate, Platelets are down to 64K  Achalasia - s/p Botox of LES by Dr. Amedeo Plenty  HIV - continue meds. Outpt ID followup.  Small pericardial effusion on chest CT, moderate on ECHO 3/23 and cannot exclude some tamponade physiology. Will need to watch BP carefully with hemodialysis  Jamal Maes, MD [facility] [phone] pager 07/03/2014, 8:38 AM  Addendum: Pharmacy consult for vanco - pharmacist noticed patient had order for Vancomycin 1gm IV pre-op for 3/28 and for unclear reasons it did not have a stop time and therefore pt had received Vancomycin 1gm IV daily x 4 days (was also dialyzed  3/29, 3/30, and today 4/1). Random vanco level was 29.7 - so - has been on vanco since Monday, random level high at time of fever spike, and pharmacy will dose accordingly.  Jamal Maes, MD [facility] [phone] Pager 07/03/2014, 8:50 PM

## 2014-07-03 NOTE — Progress Notes (Signed)
CARE MANAGEMENT NOTE 07/03/2014  Patient:  Kenneth [name], Kenneth [name]   Account Number:  [id number]  Date Initiated:  07/02/2014  Documentation initiated by:  [facility]  Subjective/Objective Assessment:   elevated INR, ESRD, insertion of IJ and fistula for hemodialysis on  06/29/14     Action/Plan:   has been set up for hemodialysis MWF at [facility] to start 07/06/14  PT eval- recommended HHPT   Anticipated DC Date:  07/04/2014   Anticipated DC Plan:  [place]  CM consult      Choice offered to / List presented to:  C-1 Patient        [place] arranged  [place] [facility]      [place].   Status of service:  In process, will continue to follow Medicare Important Message given?   (If response is "NO", the following Medicare IM given date fields will be blank) Date Medicare IM given:   Medicare IM given by:   Date Additional Medicare IM given:   Additional Medicare IM given by:    Discharge Disposition:  [place]  Per UR Regulation:  Reviewed for med. necessity/level of care/duration of stay  If discussed at [place] of Stay Meetings, dates discussed:    Comments:  07/03/14 Spoke with patient about HHC, he selected [facility] for HHPT. Contacted Miranda at [facility] and set up [place].   07/02/14 Spoke with patient about [place] and gave him list of agencies in [place]. He would like to review the list prior to making a decision. Will follow up with patient.

## 2014-07-03 NOTE — Progress Notes (Signed)
ANTIBIOTIC CONSULT NOTE - FOLLOW UP  Pharmacy Consult for Vancomycin Indication: Temp spike  Allergies  Allergen Reactions  . Amoxicillin     REACTION: diffuse rash    Patient Measurements: Height: 6\' 1"  (185.4 cm) Weight: 170 lb 6.7 oz (77.3 kg) IBW/kg (Calculated) : 79.9 Adjusted Body Weight:    Vital Signs: Temp: 98.3 F (36.8 C) (04/01 1406) Temp Source: Oral (04/01 1406) BP: 135/82 mmHg (04/01 1406) Pulse Rate: 80 (04/01 1406) Intake/Output from previous day: 03/31 0701 - 04/01 0700 In: 240 [P.O.:240] Out: -  Intake/Output from this shift: Total I/O In: 698 [I.V.:100; Blood:598] Out: 1107 [Other:1107]  Labs:  Recent Labs  07/02/14 0646 07/03/14 0616 07/03/14 0727 07/03/14 1520  WBC  --  7.8 7.5 8.3  HGB  --  6.2* 5.8* 10.0*  PLT  --  63* 64* 57*  CREATININE 5.45* 5.99* 6.01*  --    Estimated Creatinine Clearance: 15 mL/min (by C-G formula based on Cr of 6.01).  Recent Labs  07/03/14 1520  VANCORANDOM 29.7     Microbiology: Recent Results (from the past 720 hour(s))  Surgical pcr screen     Status: None   Collection Time: 06/28/14  7:48 PM  Result Value Ref Range Status   MRSA, PCR NEGATIVE NEGATIVE Final   Staphylococcus aureus NEGATIVE NEGATIVE Final    Comment:        The Xpert SA Assay (FDA approved for NASAL specimens in patients over 57 years of age), is one component of a comprehensive surveillance program.  Test performance has been validated by [facility] for patients greater than or equal to 57 year old. It is not intended to diagnose infection nor to guide or monitor treatment.     Anti-infectives    Start     Dose/Rate Route Frequency Ordered Stop   07/02/14 0000  lamiVUDine (EPIVIR) 10 MG/ML solution     50 mg Oral Daily 07/02/14 1223     07/02/14 0000  dolutegravir (TIVICAY) 50 MG tablet     50 mg Oral Daily 07/02/14 1223     07/02/14 0000  abacavir (ZIAGEN) 300 MG tablet     600 mg Oral Daily 07/02/14 1223     06/29/14 0600  vancomycin (VANCOCIN) IVPB 1000 mg/200 mL premix  Status:  Discontinued     1,000 mg 200 mL/hr over 60 Minutes Intravenous On call to O.R. 06/28/14 0844 07/03/14 1025   06/25/14 1000  lamiVUDine (EPIVIR) 10 MG/ML solution 50 mg     50 mg Oral Daily 06/24/14 0958     06/22/14 1830  abacavir (ZIAGEN) tablet 600 mg     600 mg Oral Daily 06/22/14 1728     06/22/14 1830  lamiVUDine (EPIVIR) 10 MG/ML solution 100 mg  Status:  Discontinued     100 mg Oral Daily 06/22/14 1728 06/24/14 0958   06/22/14 1830  dolutegravir (TIVICAY) tablet 50 mg     50 mg Oral Daily 06/22/14 1728     06/22/14 1800  fluconazole (DIFLUCAN) tablet 100 mg  Status:  Discontinued     100 mg Oral Weekly 06/22/14 1728 06/23/14 1831      Assessment: Assessment: 59 YOM on Coumadin PTA (INR 4.87 on admit on fluconazole) for history of DVT in January. Coumadin was reversed on admit due to supra-therapeutic INR and it remained on hold for perm cath and fistula placement.  PMH: HIV, HLD, HTN, CKD, SLE, pancytopenia, DVT  AC: hx DVT in January. INR 4.87 on admit  d/t DI with Diflucan >> FFP and Vit K 5mg  PO on 3/24. -Heparin on hold for bleeding from incisions/HD cath since 20:40p 3/29, LUE 2-3 cm hematoma - may need evac, INR 1.65. Plts down to 78 - Per FPTS - re-evaluate need for treatment; at this time patient does not need any more theraupetic anticoagulation. Has received 3 months worth of treatment. -Home dose: 5 mg TTSSu and 2.5 mg other days  ID: hx HIV (CD4 370) - Spiked temp 101.1 this AM. WBC 7.5 - abacavir, dolutegravir, lamivudine. Start Vanco.  Vanco>>3/28, 3/30, 3/31, 4/1>> - VR post-HD 29.7  4/1: Ucx>>pending 4/1: BC x 2>>pending  CV: HTN, HLD - VSS, lipitor40  Endo: no hx DM - AM gluc ok  Neuro: depression. MD notes declines treatment.  GI/Nutr: LFTs WNL. Achalasia requiring dilation in the past, Cannot swallow. Upper endoscopy 3/31; plan Botox injection.  Renal: CKD - now ESRD, Phos  4.1. AV fistula/graft on 3/28, first HD session 3/29 (3hr with low BFR), 2nd on 3/30 (3.5hrs with low BFR), again 4/1 for 4hr. Phoslo, Aranesp (charted 3/29), Renavite. Iron 37 slightly low, UIBC 54 low, TIBC 91 low, sats 41% ok.  Heme/Onc: Hx SLE - Aranesp 100 QTuesday. TSat 41 no iron needed. Hb down to 5.8. Plts 64 stable.Transfuse 2 units on dialysis  Goal of Therapy:  Vanco trough <20 prior to redosing post-HD  Plan:  Vancomycin 750mg  IV MWF with HD starting 4/4   Tiffinie Caillier S. Alford Highland, PharmD, BCPS Clinical Staff Pharmacist Pager [phone]  Eilene Ghazi Stillinger 07/03/2014,4:08 PM

## 2014-07-03 NOTE — Progress Notes (Signed)
Family Medicine Teaching Service Daily Progress Note Intern Pager: [phone]  Patient name: Kenneth [name] Medical record number: [id number] Date of birth: 18-Nov-1955 Age: 59 y.o. Gender: male  Primary Care Provider: Cordelia Poche, MD Consultants: ID Code Status: Full  Pt Overview and Major Events to Date:  3/21: Admitted for lab abnormalities and concerning physical exam 3/23: Patient agreeable to HD 3/24: Barium swallow completed 3/25: INR now in therapeutic range; N/V 3/26: EGD demonstrates achalasia vs severe dysmotility 3/28: Dialysis catheter and AV fistula by vascular 3/29: First round of HD today; started bleeding from RIJ and L. Arm fistula site 3/30: reoccurrence of N/V 3/31: Botox injection of LES by GI;  4/1 HD x3 today  Assessment and Plan: Kenneth [name] is a 59 y.o. male presenting with cough and abdominal distension in clinic, found to have supratherapeutic INR to ~8 . PMH is significant for abdominal pain, anemia, DVT, CKD II, ED, HTN, hemorrhoids, HIV, HLD, lupus, PVCs, protein calorie malnutrition.  ESRD: Most recent creatinine prior to admission 2.77 and GFR 28 03/20/14. However on admission Cr 9.56 and BUN 64. Patient is not making much urine. Progression of renal disease. 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 Renal US shows right surgical nephrectomy and left kidney with medical renal disease. Now receiving HD (started 3/29). S/p 1st stage BVT AVF on 3/28.  - trend BMP - Cr improved s/p HD - nephrology consulted; appreciate recs - HD again today CLIP process initiated, patient going to [facility]  - strict I/Os - first A-V fistula/graft procedure 3/29 was succesful without complications - stage 2 after basilic vein dilates with VVS (has appt scheduled)   - RIJ catheter placed for HD while fistula matures - HD started 3/29; patient with good diuresis   Fever: Tmax of 101.1 this AM. Unclear etiology, however concerning  given recent catheter placement. Some erythema/warmth/induration in the left antecubital region, appears stable without purulent drainage. - Blood cultures obtained - Will attempt to obtain U/A - CXR given concerns for aspiration - Will start Vancomycin  - f/u fever curve and vital signs    Nausea/Vomiting: Recurrent issue for patient. Feels as though food stuck in throat. CT with chronic esophageal dilation with fluid and debris within. Patient with probably achalasia.  - prn IV Zofran and Phenergan  - EGD demonstrated retained food, suggestive of achalasia vs severe dysmotility - s/p Botox injection of LES on 3/31 - GI following; appreciate recs  - Currently on Thin liquid diet  Recent DVT: Supratherapeutic INR now resolved. INR in clinic >8. Value later verified at 6.3. On coumadin for DVT in January in [place], no records for verification of details. Some concern in clinic of poor understanding of meds (taking when told to stop taking) / lost to f/u. No active bleeding this AM.   - Continue to monitor PT/INR - stable - Hold all anticoagulation including heparin due bleeding   - Re-evaluate patient need for treatment; at this time patient does not need any more theraupetic anticoagulation. Has received 3 months of treatment. Appears to have been unprovoked DVT  -Still awaiting records -SCDs only given recent bleeding.   Thrombocytopenia: Plts dropped from 287->104->78>63 today. All prior labs plts >200.  Could just be due to recent surgical procedure but with >50% drop and recent bleeding this is concerning. Does have hematoma on left arm. - HIT panel pending; Started heparin on 3/26 and discontinued on 3/39.   -score of 6 which correlates to  high risk.  - continue to monitor for ongoing bleeding - holding heparin and pharmacologic anticoagulation - low threshold for platelet transfusion if continues to bleed  Fluid overload: Unclear etiology. No reported dyspnea. Lung exam mild  decreased aeration. Reports LE edema is somewhat chronic. Albumin 1.1. BNP 197.  Believe patient to be intracellularly deplete but with fluid overload. CT chest with small bilateral effusions and small pericardial effusion. Echo showing grade 1 diastolic dysfunction with normal EF. Stable and improved with HD.  - encourage PO hydration - HD to help with fluid status - PT/OT eval - continue to evaluate and treat -  HHPT  Depression: Believe patient is suffering from depression but patient declines wanting treatment at this time. This will likely affect health outcomes if not monitored/treated appropriately. - Will continue to encourage and support patient.   Anemia: Patient reports previously having blood transfusion about a month ago for anemia. He denies bleeding and notes they did not find a source for low Hbg. Denies any recent colonoscopy. Renal insufficiency likely playing a factor and also anemia of chronic disease. FH of colon cancer. Hbg baseline appears to be 8-10.  - Hbg 6.2 today; will give 2u pRBCs, post-transfusion CBC - currently no source of blood loss. - ESA started 3/23 Aranesp 100 QTuesday - consider transfusing if Hbg <7 and/or patient symptomatic (would confer with nephrology prior).  HIV: Unclear what medications he is taking from home med list as patient is unclear about this in clinic. Previously seen by Dr [name] (ID). States he was taking his HIV medications. Viral load and CD4 count appropriate. No concern at this time for HIV related opportunistic infections. However patient with a Tmax of 101.1 this AM.  - ID signed off .> outpatient follow-up arranged.  - continue HAART therapy   HTN: Elevated this morning but soft pressures during HD.  - Continue home norvasc - HD today to help with BP control  Lupus: Recent dx per patient.   HLD - Continued home atorvastatin  FEN/GI: thin liquid diet, advance per GI; PPI BID Prophylaxis: SCDs  Disposition: Continue  current management; pending improvement of multiple medical conditions.   Subjective:  Patient doing ok. No chills, cough, chest pain, SOB. Feels his L arm is stable. No abdominal pain/diarrhea. No hematuria, melana, hematochezia.  Objective: Temp:  [97.7 F (36.5 C)-101.1 F (38.4 C)] 101.1 F (38.4 C) (04/01 0543) Pulse Rate:  [88-108] 99 (04/01 0543) Resp:  [10-30] 18 (04/01 0543) BP: (132-172)/(78-125) 138/81 mmHg (04/01 0543) SpO2:  [98 %-100 %] 98 % (04/01 0543) Weight:  [172 lb 2.9 oz (78.1 kg)] 172 lb 2.9 oz (78.1 kg) (04/01 0543) Physical Exam: General: Comfortable, NAD, well-appearing, awake in HD HEENT: AT/[place], sclera clear, EOMI, MMM Cardiovascular: RRR, no m/r/g Respiratory: CTAB, normal effort, no crackles appreciated Abdomen: Soft, nontender, no obvious organomegaly, +BS Extremities: minimal trace edema in BLE, no asymmetry or calf tenderness, LUE with swelling, bruising, and hematoma on the forearm.  Induration, crusted blood, erythema, and warmth over the left antecubital region.   RUE with swelling due to IV infiltration. Palpable radial pulse.  Skin: No rash or cyanosis. WWP. Neuro: Awake, alert, grossly nonfocal, moves all extremities normally   Intake/Output Summary (Last 24 hours) at 07/03/14 0654 Last data filed at 07/02/14 1300  Gross per 24 hour  Intake    240 ml  Output      0 ml  Net    240 ml   Laboratory: No results found  for this or any previous visit (from the past 24 hour(s)).  Imaging/Diagnostic Tests: Ct Chest and Abdomen Pelvis Wo Contrast 3/21:  CT CHEST IMPRESSION  1. Moderate degradation, secondary to lack of contrast, paucity of fat, and extent of anasarca. 2. Small bilateral pleural effusions with adjacent subsegmental atelectasis. 3. New pericardial effusion, small. 4. Chronic esophageal dilatation with fluid and debris within. This suggests a component of obstruction at the gastroesophageal junction and/or esophageal dysmotility.   CT  ABDOMEN AND PELVIS IMPRESSION  1. Development of moderate abdominal pelvic ascites. 2. Apparent irregular hepatic capsule which could be CT artifactual. Interval development of cirrhosis since 03/16/2014 is felt unlikely. Correlate with risk factors. 3. Cholelithiasis. 4. Anasarca.    Renal ultrasound 3/22: 1. Right nephrectomy and chronic left medical renal disease. 2. No hydronephrosis. 3. Known ascites.  Barium swallow 3/24: 1. Stable exam since 2007. 2. Esophageal dysmotility and dilation, but no akinesia typical of achalasia. Non-fixed narrowing at the GE junction, at least partly related to remote anti reflux surgery.  EGD 3/26: Retained food in esophagus, relieved endoscopically, suspect achalasia or severe motility disorder  Archie Patten, MD 07/03/2014, 6:54 AM PGY-1, [facility] Intern pager: [phone], text pages welcome

## 2014-07-03 NOTE — Plan of Care (Signed)
Problem: Phase II Progression Outcomes Goal: Discharge plan established Outcome: Completed/Met Date Met:  07/03/14 To return home with H/H

## 2014-07-03 NOTE — Progress Notes (Signed)
ANTIBIOTIC CONSULT NOTE - INITIAL  Pharmacy Consult for Vancomycin Indication: fever (after new HD cath placement)  Allergies  Allergen Reactions  . Amoxicillin     REACTION: diffuse rash    Patient Measurements: Height: 6\' 1"  (185.4 cm) Weight: 172 lb 6.4 oz (78.2 kg) IBW/kg (Calculated) : 79.9  Vital Signs: Temp: 98.4 F (36.9 C) (04/01 0930) Temp Source: Oral (04/01 0930) BP: 133/86 mmHg (04/01 0930) Pulse Rate: 87 (04/01 0930) Intake/Output from previous day: 03/31 0701 - 04/01 0700 In: 240 [P.O.:240] Out: -  Intake/Output from this shift: Total I/O In: 381 [I.V.:100; Blood:281] Out: -   Labs:  Recent Labs  07/02/14 0640 07/02/14 0646 07/03/14 0616 07/03/14 0727  WBC 7.9  --  7.8 7.5  HGB 7.8*  --  6.2* 5.8*  PLT 78*  --  63* 64*  CREATININE  --  5.45* 5.99* 6.01*   Estimated Creatinine Clearance: 15.2 mL/min (by C-G formula based on Cr of 6.01).    Microbiology: Recent Results (from the past 720 hour(s))  Surgical pcr screen     Status: None   Collection Time: 06/28/14  7:48 PM  Result Value Ref Range Status   MRSA, PCR NEGATIVE NEGATIVE Final   Staphylococcus aureus NEGATIVE NEGATIVE Final    Comment:        The Xpert SA Assay (FDA approved for NASAL specimens in patients over 23 years of age), is one component of a comprehensive surveillance program.  Test performance has been validated by [facility] for patients greater than or equal to 25 year old. It is not intended to diagnose infection nor to guide or monitor treatment.     Medical History: Past Medical History  Diagnosis Date  . HIV infection dx ~ 2009    a. 02/13/2014 CD4 = 240 - undetectable viral load.  . Hyperlipidemia   . Hypertension   . Insomnia   . Dysphagia 2008    post heller myotomy/toupee fundoplication.    . Achalasia   . Renal abscess   . Hemorrhoids, internal   . Premature ventricular contractions   . Pancytopenia   . CKD (chronic kidney disease), stage  II   . Lupus nephritis   . SLE (systemic lupus erythematosus) dx'd 2015  . Candida infection, esophageal 08/2010    a. 2012 - noted on EGD.  Marland Kitchen DVT (deep venous thrombosis) ~ 04/2014  . History of blood transfusion 03/2014    "low counts"  . History of stomach ulcers     Medications:  Scheduled:  . sodium chloride   Intravenous Once  . abacavir  600 mg Oral Daily  . acetaminophen      . atorvastatin  40 mg Oral q1800  . calcium acetate  1,334 mg Oral TID WC  . [START ON 07/08/2014] darbepoetin (ARANESP) injection - DIALYSIS  100 mcg Intravenous Q Wed-HD  . dolutegravir  50 mg Oral Daily  . lamiVUDine  50 mg Oral Daily  . multivitamin  1 tablet Oral QHS  . pantoprazole (PROTONIX) IV  40 mg Intravenous Q12H  . sodium chloride  3 mL Intravenous Q12H  . vancomycin  1,000 mg Intravenous On Call to OR   Assessment: 59 yo M with new fever (101) with dialysis today.  Pharmacy received consult to start Vancomycin empirically given pt had recent HD cath placement (3/28).  Upon chart review, I noticed this patient had an order for Vancomycin 1gm IV pre-op for 3/28.  This order however did not have a  stop time and this patient has received Vancomycin 1gm IV daily x 4 days.  Pt has also had HD 3/29, 3/30, and currently at HD.  Pt was essentially loaded with Vancomycin based on dosing times and HD sessions.  Will not give additional Vancomycin after HD at this time.  Will order Vancomycin random level 4 hours after HD today to evaluate drug level and redose as indicated.  I have communicated this information to Dr. Lorrene Reid since pt has been receiving Vancomycin since 3/28 and this may alter the results of blood cx obtained today.  Goal of Therapy:  pre-HD Vancomycin level 15-25 mcg/ml post-HD Vancomycin level 5-15 mcg/ml  Plan:  Random Vancomycin level today ~ 4 hours after completion of dialysis. Redose Vancomycin based on level results.  [employer], Pharm.D., BCPS Clinical  Pharmacist Pager [phone] 07/03/2014 10:19 AM

## 2014-07-04 LAB — RENAL FUNCTION PANEL
Anion gap: 5 (ref 5–15)
BUN: 21 mg/dL (ref 6–23)
CO2: 29 mmol/L (ref 19–32)
Calcium: 7.1 mg/dL — ABNORMAL LOW (ref 8.4–10.5)
Chloride: 105 mmol/L (ref 96–112)
Creatinine, Ser: 4.19 mg/dL — ABNORMAL HIGH (ref 0.50–1.35)
GFR calc Af Amer: 17 mL/min — ABNORMAL LOW (ref >90)
GFR calc non Af Amer: 15 mL/min — ABNORMAL LOW (ref >90)
GLUCOSE: 89 mg/dL (ref 70–99)
Phosphorus: 3.5 mg/dL (ref 2.3–4.6)
Potassium: 3.7 mmol/L (ref 3.5–5.1)
SODIUM: 139 mmol/L (ref 135–145)

## 2014-07-04 LAB — URINE MICROSCOPIC-ADD ON

## 2014-07-04 LAB — TYPE AND SCREEN
ABO/RH(D): O POS
ANTIBODY SCREEN: NEGATIVE
Unit division: 0
Unit division: 0

## 2014-07-04 LAB — CBC
HCT: 26.1 % — ABNORMAL LOW (ref 39.0–52.0)
Hemoglobin: 8.6 g/dL — ABNORMAL LOW (ref 13.0–17.0)
MCH: 29.8 pg (ref 26.0–34.0)
MCHC: 33 g/dL (ref 30.0–36.0)
MCV: 90.3 fL (ref 78.0–100.0)
Platelets: 62 10*3/uL — ABNORMAL LOW (ref 150–400)
RBC: 2.89 MIL/uL — AB (ref 4.22–5.81)
RDW: 17.3 % — AB (ref 11.5–15.5)
WBC: 7.1 10*3/uL (ref 4.0–10.5)

## 2014-07-04 LAB — URINALYSIS, ROUTINE W REFLEX MICROSCOPIC
Bilirubin Urine: NEGATIVE
Glucose, UA: NEGATIVE mg/dL
KETONES UR: NEGATIVE mg/dL
Leukocytes, UA: NEGATIVE
NITRITE: NEGATIVE
Protein, ur: >300 mg/dL — AB
SPECIFIC GRAVITY, URINE: 1.025 (ref 1.005–1.030)
Urobilinogen, UA: 0.2 mg/dL (ref 0.0–1.0)
pH: 6 (ref 5.0–8.0)

## 2014-07-04 LAB — PROTIME-INR
INR: 1.41 (ref 0.00–1.49)
Prothrombin Time: 17.4 seconds — ABNORMAL HIGH (ref 11.6–15.2)

## 2014-07-04 MED ORDER — PANTOPRAZOLE SODIUM 40 MG PO TBEC
40.0000 mg | DELAYED_RELEASE_TABLET | Freq: Two times a day (BID) | ORAL | Status: DC
  Administered 2014-07-04 – 2014-07-07 (×6): 40 mg via ORAL
  Filled 2014-07-04 (×5): qty 1

## 2014-07-04 MED ORDER — PANTOPRAZOLE SODIUM 40 MG PO TBEC: 40.0000 mg | DELAYED_RELEASE_TABLET | Freq: Two times a day (BID) | ORAL | Status: DC

## 2014-07-04 NOTE — Progress Notes (Signed)
[facility] Rounding Note Subjective:   Temp spike to 101 - yesterday blood cultures sent/empiric vanco was ordered. Pharmacy did consult for vanco - pharmacist noticed patient had order for Vancomycin 1gm IV pre-op for 3/28 and for unclear reasons it did not have a stop time and therefore pt had received Vancomycin 1gm IV daily x 4 days (was also dialyzed  3/29, 3/30, and today 4/1). Random vanco level was 29.7 - so - has been on vanco since Monday, random level high at time of fever spike, and pharmacy will dose accordingly.   Received 2 U of blood on HD yesterday for Hb of 5.8  Biggest complaint is soreness in his arm - both the access site and the forearm exploration site (big hematoma there)   Objective Vital signs in last 24 hours: Filed Vitals:   07/03/14 1406 07/03/14 2147 07/04/14 0536 07/04/14 0537  BP: 135/82 142/90 130/85   Pulse: 80 64 80   Temp: 98.3 F (36.8 C) 99.5 F (37.5 C) 99.5 F (37.5 C)   TempSrc: Oral Oral Oral   Resp: 16 12 16    Height:      Weight:    78.5 kg (173 lb 1 oz)  SpO2: 98% 100% 99%    Weight change: 0.1 kg (3.5 oz)  Intake/Output Summary (Last 24 hours) at 07/04/14 1240 Last data filed at 07/04/14 0912  Gross per 24 hour  Intake    240 ml  Output    325 ml  Net    -85 ml   Physical Exam:  BP 130/85 mmHg  Pulse 80  Temp(Src) 99.5 F (37.5 C) (Oral)  Resp 16  Ht 6\' 1"  (1.854 m)  Wt 78.5 kg (173 lb 1 oz)  BMI 22.84 kg/m2  SpO2 99%   Comfortable in bed No JVD TDC site not tender/dsg intact No obvious bleeding  Lungs grossly clear  Heart sounds distant S1S2 No S3 and no pericardial rub Abd soft, no focal tenderness at this time Trace edema LE's Left arm edema nearly to shoulder including hand and moderate hematoma left forearm where the vein was explored. L AVF with + bruit.    Labs:  Recent Labs Lab 06/29/14 0908 06/30/14 0236 07/01/14 0510 07/02/14 0646 07/03/14 0616 07/03/14 0727 07/04/14 0547  NA 140  137 142 140 137 139 139  K 4.4 4.3 3.9 4.1 4.0 4.1 3.7  CL 116* 113* 114* 110 108 107 105  CO2 17* 17* 23 23 26 28 29   GLUCOSE 86 128* 94 89 114* 103* 89  BUN 68* 67* 48* 28* 34* 34* 21  CREATININE 9.19* 9.14* 7.21* 5.45* 5.99* 6.01* 4.19*  CALCIUM 7.4* 7.3* 7.2* 7.4* 7.1* 7.2* 7.1*  PHOS  --  6.7* 5.1* 4.1 3.9 3.8 3.5     Recent Labs Lab 07/03/14 0616 07/03/14 0727 07/04/14 0547  ALBUMIN <1.0* <1.0* <1.0*    Recent Labs Lab 07/03/14 0616 07/03/14 0727 07/03/14 1520 07/04/14 0547  WBC 7.8 7.5 8.3 7.1  HGB 6.2* 5.8* 10.0* 8.6*  HCT 19.0* 17.7* 30.3* 26.1*  MCV 94.1 93.2 89.1 90.3  PLT 63* 64* 57* 62*     No results for input(s): IRON, TIBC, TRANSFERRIN, FERRITIN in the last 168 hours. Lab Results  Component Value Date   INR 1.41 07/04/2014   INR 1.75* 07/03/2014   INR 1.65* 07/02/2014    Medications: . sodium chloride 10 mL/hr at 06/29/14 1137   . sodium chloride   Intravenous Once  . abacavir  600 mg Oral Daily  . atorvastatin  40 mg Oral q1800  . calcium acetate  1,334 mg Oral TID WC  . [START ON 07/08/2014] darbepoetin (ARANESP) injection - DIALYSIS  100 mcg Intravenous Q Wed-HD  . dolutegravir  50 mg Oral Daily  . lamiVUDine  50 mg Oral Daily  . multivitamin  1 tablet Oral QHS  . pantoprazole  40 mg Oral BID  . sodium chloride  3 mL Intravenous Q12H  . [START ON 07/06/2014] vancomycin  1,000 mg Intravenous Q M,W,F-HD    Background: 59 y.o. 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 Prior renal biopsy 2015 with diffuse proliferative lupus and a component of collapsing FSGS. Was on steroids and Cellcept in Dec when admitted here with pancytopenia and cellcept was held. Was on prednisone only after hospital discharge in December  (discharge creatinine 2.77) then lost to local followup while staying in [place]. Recently returned, admitted 3/21 with abdominal pain, found to have progressive renal failure (creatinine close to 10) and deemed ESRD by Dr. Justin Mend.   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 Dr. Justin Mend has deemed ESRD. Permanent access placed 06/29/14  [facility] + 1st stage BVT AVF (Dr.Early). HD#1 3/29 HD #2 3/30 and HD #3 4/2. He has been CLIPPED to [facility], MWF, 2nd shift. Next HD will be on Monday.  Fever - temp to 101 yesterday. Worrisome since new dialysis catheter. Blood cultures done. Empiric vanco. See above - turns out his "pre-op" vanco dose had no stop date so he has essentially been receiving it since Monday   S/p 1st stage BVT AVF 3/28. Will need 1 month f/u at [facility], then 2nd stage of BVT at their discretion.  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 VDRA not indicated. Started binder.  Phos has normalized.                                                      HTN controlled   Anemia ESA started Aranesp 100 QTuesday. TSat 41 no iron needed. (Had transfusion "recently - not this admission).Hb down to 5.8. Transfused 2 units on dialysis 4/1  Depression - declines treatment  Recent DVT with supratherapeutic INR on admission . Heparin stopped due to bleeding from catheter and surgical sites. .  Since had 3 months of treatment for DVT is to get no further anticoagulant.   Thrombocytopenia - HIT panel still pending. No heparin with HD. Block catheter with citrate, Platelets are down to 64K  Achalasia - s/p Botox of LES by Dr. Amedeo Plenty  HIV - continue meds. Outpt ID followup.  Small pericardial effusion on chest CT, moderate on ECHO 3/23 and cannot exclude some tamponade physiology. Will need to watch BP carefully with hemodialysis  Jamal Maes, MD [facility] [phone] pager 07/04/2014, 12:40 PM

## 2014-07-04 NOTE — Progress Notes (Signed)
[facility] Progress Note  Subjective: The patient states that he is tolerating his current diet well which is a dysphagia 3 diet. He has no specific complaints.  Objective: Vital signs in last 24 hours: Temp:  [98.3 F (36.8 C)-99.5 F (37.5 C)] 99.5 F (37.5 C) (04/02 0536) Pulse Rate:  [64-80] 80 (04/02 0536) Resp:  [12-18] 16 (04/02 0536) BP: (130-142)/(82-90) 130/85 mmHg (04/02 0536) SpO2:  [98 %-100 %] 99 % (04/02 0536) Weight:  [77.3 kg (170 lb 6.7 oz)-78.5 kg (173 lb 1 oz)] 78.5 kg (173 lb 1 oz) (04/02 0537) Weight change: 0.1 kg (3.5 oz)   PE  No distress  Abdomen soft nontender  Lab Results: Results for orders placed or performed during the hospital encounter of 06/22/14 (from the past 24 hour(s))  Vancomycin, random     Status: None   Collection Time: 07/03/14  3:20 PM  Result Value Ref Range   Vancomycin Rm 29.7 ug/mL  CBC     Status: Abnormal   Collection Time: 07/03/14  3:20 PM  Result Value Ref Range   WBC 8.3 4.0 - 10.5 K/uL   RBC 3.40 (L) 4.22 - 5.81 MIL/uL   Hemoglobin 10.0 (L) 13.0 - 17.0 g/dL   HCT 30.3 (L) 39.0 - 52.0 %   MCV 89.1 78.0 - 100.0 fL   MCH 29.4 26.0 - 34.0 pg   MCHC 33.0 30.0 - 36.0 g/dL   RDW 16.9 (H) 11.5 - 15.5 %   Platelets 57 (L) 150 - 400 K/uL  Urinalysis, Routine w reflex microscopic     Status: Abnormal   Collection Time: 07/04/14 12:22 AM  Result Value Ref Range   Color, Urine YELLOW YELLOW   APPearance CLOUDY (A) CLEAR   Specific Gravity, Urine 1.025 1.005 - 1.030   pH 6.0 5.0 - 8.0   Glucose, UA NEGATIVE NEGATIVE mg/dL   Hgb urine dipstick LARGE (A) NEGATIVE   Bilirubin Urine NEGATIVE NEGATIVE   Ketones, ur NEGATIVE NEGATIVE mg/dL   Protein, ur >300 (A) NEGATIVE mg/dL   Urobilinogen, UA 0.2 0.0 - 1.0 mg/dL   Nitrite NEGATIVE NEGATIVE   Leukocytes, UA NEGATIVE NEGATIVE  Urine microscopic-add on     Status: None   Collection Time: 07/04/14 12:22 AM  Result Value Ref Range   Squamous Epithelial / LPF  RARE RARE   WBC, UA 7-10 <3 WBC/hpf   RBC / HPF 21-50 <3 RBC/hpf   Bacteria, UA RARE RARE  Protime-INR     Status: Abnormal   Collection Time: 07/04/14  5:47 AM  Result Value Ref Range   Prothrombin Time 17.4 (H) 11.6 - 15.2 seconds   INR 1.41 0.00 - 1.49  CBC     Status: Abnormal   Collection Time: 07/04/14  5:47 AM  Result Value Ref Range   WBC 7.1 4.0 - 10.5 K/uL   RBC 2.89 (L) 4.22 - 5.81 MIL/uL   Hemoglobin 8.6 (L) 13.0 - 17.0 g/dL   HCT 26.1 (L) 39.0 - 52.0 %   MCV 90.3 78.0 - 100.0 fL   MCH 29.8 26.0 - 34.0 pg   MCHC 33.0 30.0 - 36.0 g/dL   RDW 17.3 (H) 11.5 - 15.5 %   Platelets 62 (L) 150 - 400 K/uL  Renal function panel     Status: Abnormal   Collection Time: 07/04/14  5:47 AM  Result Value Ref Range   Sodium 139 135 - 145 mmol/L   Potassium 3.7 3.5 - 5.1 mmol/L   Chloride  105 96 - 112 mmol/L   CO2 29 19 - 32 mmol/L   Glucose, Bld 89 70 - 99 mg/dL   BUN 21 6 - 23 mg/dL   Creatinine, Ser 4.19 (H) 0.50 - 1.35 mg/dL   Calcium 7.1 (L) 8.4 - 10.5 mg/dL   Phosphorus 3.5 2.3 - 4.6 mg/dL   Albumin <1.0 (L) 3.5 - 5.2 g/dL   GFR calc non Af Amer 15 (L) >90 mL/min   GFR calc Af Amer 17 (L) >90 mL/min   Anion gap 5 5 - 15    Studies/Results: Dg Chest 2 View  07/03/2014   CLINICAL DATA:  Fever of unknown origin. History of HIV, hypertension, and lupus.  EXAM: CHEST  2 VIEW  COMPARISON:  06/29/2014  FINDINGS: Dual lumen right jugular central venous catheter remains in place with tips overlying the lower SVC and high right atrium. Cardiac silhouette is within normal limits for size. Small right and small to moderate left pleural effusions are similar to the prior study. Left lower lobe consolidation is stable to minimally improved. No pneumothorax. Surgical clips are noted in the left upper quadrant. Moderate thoracic dextroscoliosis is present.  IMPRESSION: 1. Small right and small to moderate left pleural effusions, similar to prior. 2. Stable to minimally improved left lower lobe  consolidation.   Electronically Signed   By: Logan Bores   On: 07/03/2014 13:19      Assessment: Esophageal dysmotility, suspect achalasia  Plan: Continue dysphagia 3 diet.    GANEM,SALEM F 07/04/2014, 11:09 AM

## 2014-07-04 NOTE — Progress Notes (Signed)
Family Medicine Teaching Service Daily Progress Note Intern Pager: [phone]  Patient name: SAIED BOLDT Medical record number: [id number] Date of birth: 1955/12/17 Age: 59 y.o. Gender: male  Primary Care Provider: Cordelia Poche, MD Consultants: ID, nephrology Code Status: Full  Pt Overview and Major Events to Date:  3/21: Admitted for lab abnormalities and concerning physical exam 3/23: Patient agreeable to HD 3/24: Barium swallow completed 3/25: INR now in therapeutic range; N/V 3/26: EGD demonstrates achalasia vs severe dysmotility 3/28: Dialysis catheter and AV fistula by vascular 3/29: First round of HD today; started bleeding from RIJ and L. Arm fistula site 3/30: reoccurrence of N/V 3/31: Botox injection of LES by GI;  4/1 HD x3 today, 2u PRBC for hgb 5.8  Assessment and Plan: ISSAAC CLAYWELL is a 60 y.o. male presenting with cough and abdominal distension in clinic, found to have supratherapeutic INR to ~8 . PMH is significant for abdominal pain, anemia, DVT, CKD II, ED, HTN, hemorrhoids, HIV, HLD, lupus, PVCs, protein calorie malnutrition.  ESRD: Most recent creatinine prior to admission 2.77 and GFR 28 03/20/14. However on admission Cr 9.56 and BUN 64. Patient continues to make some urine. 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 Renal US shows right surgical nephrectomy and left kidney with medical renal disease. Now receiving HD (started 3/29). S/p 1st stage BVT AVF on 3/28.  - trend BMP - Cr improved s/p HD - nephrology consulted; appreciate recs -  patient going to [facility]  - strict I/Os - first A-V fistula/graft procedure 3/29 was succesful without complications - stage 2 after basilic vein dilates with VVS (has appt scheduled)   - RIJ catheter placed for HD while fistula matures - HD started 3/29  Fever: Tmax of 101.1 on 4/1 with a Tmax overnight of 99.5. Unclear etiology, however concerning given recent  catheter placement. From review of medications, the patient was ordered for vancomycin 1000mg  on call to the OR for vascular surgery on 3/28 that was never discontinued (unclear when discontinuation should have occurred), therefore the patient had been receiving vancomycin x 4 days prior to this fever. The vancomycin itself may be the culprit of the patient's fever.  He has some erythema/warmth/induration in the left antecubital region, which appears worsened today, however without purulent drainage. - will mark left antecubital to assess for change.  - Blood cultures pending  - U/A not consistent with a urinary source. - CXR appeared stable and patient without respiratory symptoms - Will continue Vancomycin with HD per nephro recs, possible discontinuation after blood cultures are negative x 48hrs? - f/u fever curve and vital signs   Anemia: Patient reports previously having blood transfusion about a month ago for anemia. He denies bleeding and notes they did not find a source for low Hbg. Denies any recent colonoscopy. Renal insufficiency likely playing a factor and also anemia of chronic disease. FH of colon cancer. Hbg baseline appears to be 8-10.  - Hbg dropped from 10.0 post transfusion to 8.6 this AM.  - currently no source of blood loss (hematoma on left arm appears stable.  - ESA and Aranesp 100 QTuesday - consider transfusing if Hbg <7 and/or patient symptomatic.   Nausea/Vomiting: Recurrent issue for patient. Feels as though food stuck in throat. CT with chronic esophageal dilation with fluid and debris within. Patient with probably achalasia.  - prn IV Zofran and Phenergan  - EGD demonstrated retained food, suggestive of achalasia vs severe dysmotility - s/p  Botox injection of LES on 3/31 - GI following; appreciate recs  - Patient tolerating a dysphagia 3 diet   Recent DVT: Supratherapeutic INR now resolved. INR in clinic >8. Value later verified at 6.3. On coumadin for DVT in  January in [place], no records for verification of details. Some concern in clinic of poor understanding of meds (taking when told to stop taking) / lost to f/u. No active bleeding this AM.   - Continue to monitor PT/INR - stable - Hold all anticoagulation including heparin due bleeding   - Re-evaluate patient need for treatment; at this time patient does not need any more theraupetic anticoagulation. Has received 3 months of treatment. Appears to have been unprovoked DVT  -Still awaiting records -SCDs only given recent bleeding.   Thrombocytopenia: Plts dropped from 287->104->78>63>57>62 today. All prior labs plts >200.  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 He does have a  hematoma on left arm. - HIT panel pending; Started heparin on 3/26 and discontinued on 3/39.   -score of 6 which correlates to high risk.  - continue to monitor for ongoing bleeding - holding heparin and pharmacologic anticoagulation - low threshold for platelet transfusion if continues to bleed  Fluid overload: Improved, stable Unclear etiology. No reported dyspnea. Lung exam mild decreased aeration. Reports LE edema is somewhat chronic. Albumin 1.1. BNP 197.  Believe patient to be intracellularly deplete but with fluid overload. CT chest with small bilateral effusions and small pericardial effusion. Echo showing grade 1 diastolic dysfunction with normal EF. Stable and improved with HD from CXR on 4/1.  - encourage PO hydration - HD to help with fluid status - PT/OT eval - continue to evaluate and treat -  HHPT  Depression: Believe patient is suffering from depression but patient declines wanting treatment at this time. This will likely affect health outcomes if not monitored/treated appropriately. - Will continue to encourage and support patient.   HIV: Unclear what medications he is taking from home med list as patient is unclear about this in  clinic. Previously seen by Dr [name] (ID). States he was taking his HIV medications. Viral load and CD4 count appropriate. No concern at this time for HIV related opportunistic infections.  - ID signed off .> outpatient follow-up arranged.  - continue HAART therapy   HTN: Appears stable. - Continue home norvasc - Continue to monitor   Lupus: Recent dx per patient.   HLD - Continued home atorvastatin  FEN/GI: KVO dysphagia 3 diet; PPI BID (transitioned from IV to PO today) Prophylaxis: SCDs  Disposition: Continue current management; pending negative blood cultures and stabilization of Hgb.  Subjective:  Patient doing well overnight. No fever, chills, abdominal pain, dysuria, cough, SOB. He feels his L UE hematoma is stable, unsure about the antecubital region. His RN who has cared for him several days feels the induration/reddness may be worsening. Eating well without concerns.   Objective: Temp:  [98.1 F (36.7 C)-99.5 F (37.5 C)] 99.5 F (37.5 C) (04/02 0536) Pulse Rate:  [64-95] 80 (04/02 0536) Resp:  [12-18] 16 (04/02 0536) BP: (130-146)/(80-90) 130/85 mmHg (04/02 0536) SpO2:  [98 %-100 %] 99 % (04/02 0536) Weight:  [170 lb 6.7 oz (77.3 kg)-173 lb 1 oz (78.5 kg)] 173 lb 1 oz (78.5 kg) (04/02 0537) Physical Exam: General: Comfortable, NAD, well-appearing, sitting up in bed HEENT: AT/[place], sclera clear, EOMI, MMM Cardiovascular: RRR, no m/r/g Respiratory: CTAB,  normal effort, no crackles appreciated Abdomen: +BS, soft, nontender, non-distended. Easily reducible ventral hernia Extremities: minimal trace edema in BLE, no asymmetry or calf tenderness, LUE with swelling, bruising, and hematoma on the forearm.  Induration, crusted blood, erythema, and warmth over the left antecubital region (picture below)   Skin: No rash or cyanosis. WWP. Neuro: Awake, alert, grossly nonfocal, moves all extremities normally   Intake/Output Summary (Last 24 hours) at 07/04/14 0721 Last data  filed at 07/04/14 0537  Gross per 24 hour  Intake    818 ml  Output   1432 ml  Net   -614 ml   Laboratory: Results for orders placed or performed during the hospital encounter of 06/22/14 (from the past 24 hour(s))  CBC     Status: Abnormal   Collection Time: 07/03/14  7:27 AM  Result Value Ref Range   WBC 7.5 4.0 - 10.5 K/uL   RBC 1.90 (L) 4.22 - 5.81 MIL/uL   Hemoglobin 5.8 (LL) 13.0 - 17.0 g/dL   HCT 17.7 (L) 39.0 - 52.0 %   MCV 93.2 78.0 - 100.0 fL   MCH 30.5 26.0 - 34.0 pg   MCHC 32.8 30.0 - 36.0 g/dL   RDW 16.5 (H) 11.5 - 15.5 %   Platelets 64 (L) 150 - 400 K/uL  Renal function panel     Status: Abnormal   Collection Time: 07/03/14  7:27 AM  Result Value Ref Range   Sodium 139 135 - 145 mmol/L   Potassium 4.1 3.5 - 5.1 mmol/L   Chloride 107 96 - 112 mmol/L   CO2 28 19 - 32 mmol/L   Glucose, Bld 103 (H) 70 - 99 mg/dL   BUN 34 (H) 6 - 23 mg/dL   Creatinine, Ser 6.01 (H) 0.50 - 1.35 mg/dL   Calcium 7.2 (L) 8.4 - 10.5 mg/dL   Phosphorus 3.8 2.3 - 4.6 mg/dL   Albumin <1.0 (L) 3.5 - 5.2 g/dL   GFR calc non Af Amer 9 (L) >90 mL/min   GFR calc Af Amer 11 (L) >90 mL/min   Anion gap 4 (L) 5 - 15  Type and screen     Status: None   Collection Time: 07/03/14  7:50 AM  Result Value Ref Range   ABO/RH(D) O POS    Antibody Screen NEG    Sample Expiration 07/06/2014    Unit Number [id number]    Blood Component Type RBC LR PHER1    Unit division 00    Status of Unit ISSUED,FINAL    Transfusion Status OK TO TRANSFUSE    Crossmatch Result Compatible    Unit Number [id number]    Blood Component Type RBC LR PHER2    Unit division 00    Status of Unit ISSUED,FINAL    Transfusion Status OK TO TRANSFUSE    Crossmatch Result Compatible   Prepare RBC     Status: None   Collection Time: 07/03/14  7:50 AM  Result Value Ref Range   Order Confirmation ORDER PROCESSED BY BLOOD BANK   ABO/Rh     Status: None   Collection Time: 07/03/14  7:50 AM  Result Value Ref Range    ABO/RH(D) O POS   Vancomycin, random     Status: None   Collection Time: 07/03/14  3:20 PM  Result Value Ref Range   Vancomycin Rm 29.7 ug/mL  CBC     Status: Abnormal   Collection Time: 07/03/14  3:20 PM  Result Value Ref Range  WBC 8.3 4.0 - 10.5 K/uL   RBC 3.40 (L) 4.22 - 5.81 MIL/uL   Hemoglobin 10.0 (L) 13.0 - 17.0 g/dL   HCT 30.3 (L) 39.0 - 52.0 %   MCV 89.1 78.0 - 100.0 fL   MCH 29.4 26.0 - 34.0 pg   MCHC 33.0 30.0 - 36.0 g/dL   RDW 16.9 (H) 11.5 - 15.5 %   Platelets 57 (L) 150 - 400 K/uL  Urinalysis, Routine w reflex microscopic     Status: Abnormal   Collection Time: 07/04/14 12:22 AM  Result Value Ref Range   Color, Urine YELLOW YELLOW   APPearance CLOUDY (A) CLEAR   Specific Gravity, Urine 1.025 1.005 - 1.030   pH 6.0 5.0 - 8.0   Glucose, UA NEGATIVE NEGATIVE mg/dL   Hgb urine dipstick LARGE (A) NEGATIVE   Bilirubin Urine NEGATIVE NEGATIVE   Ketones, ur NEGATIVE NEGATIVE mg/dL   Protein, ur >300 (A) NEGATIVE mg/dL   Urobilinogen, UA 0.2 0.0 - 1.0 mg/dL   Nitrite NEGATIVE NEGATIVE   Leukocytes, UA NEGATIVE NEGATIVE  Urine microscopic-add on     Status: None   Collection Time: 07/04/14 12:22 AM  Result Value Ref Range   Squamous Epithelial / LPF RARE RARE   WBC, UA 7-10 <3 WBC/hpf   RBC / HPF 21-50 <3 RBC/hpf   Bacteria, UA RARE RARE  Protime-INR     Status: Abnormal   Collection Time: 07/04/14  5:47 AM  Result Value Ref Range   Prothrombin Time 17.4 (H) 11.6 - 15.2 seconds   INR 1.41 0.00 - 1.49  CBC     Status: Abnormal   Collection Time: 07/04/14  5:47 AM  Result Value Ref Range   WBC 7.1 4.0 - 10.5 K/uL   RBC 2.89 (L) 4.22 - 5.81 MIL/uL   Hemoglobin 8.6 (L) 13.0 - 17.0 g/dL   HCT 26.1 (L) 39.0 - 52.0 %   MCV 90.3 78.0 - 100.0 fL   MCH 29.8 26.0 - 34.0 pg   MCHC 33.0 30.0 - 36.0 g/dL   RDW 17.3 (H) 11.5 - 15.5 %   Platelets 62 (L) 150 - 400 K/uL  Renal function panel     Status: Abnormal (Preliminary result)   Collection Time: 07/04/14  5:47 AM    Result Value Ref Range   Sodium PENDING 135 - 145 mmol/L   Potassium PENDING 3.5 - 5.1 mmol/L   Chloride PENDING 96 - 112 mmol/L   CO2 PENDING 19 - 32 mmol/L   Glucose, Bld 89 70 - 99 mg/dL   BUN 21 6 - 23 mg/dL   Creatinine, Ser 4.19 (H) 0.50 - 1.35 mg/dL   Calcium 7.1 (L) 8.4 - 10.5 mg/dL   Phosphorus 3.5 2.3 - 4.6 mg/dL   Albumin PENDING 3.5 - 5.2 g/dL   GFR calc non Af Amer 15 (L) >90 mL/min   GFR calc Af Amer 17 (L) >90 mL/min   Anion gap PENDING 5 - 15    Imaging/Diagnostic Tests: Ct Chest and Abdomen Pelvis Wo Contrast 3/21:  CT CHEST IMPRESSION  1. Moderate degradation, secondary to lack of contrast, paucity of fat, and extent of anasarca. 2. Small bilateral pleural effusions with adjacent subsegmental atelectasis. 3. New pericardial effusion, small. 4. Chronic esophageal dilatation with fluid and debris within. This suggests a component of obstruction at the gastroesophageal junction and/or esophageal dysmotility.   CT ABDOMEN AND PELVIS IMPRESSION  1. Development of moderate abdominal pelvic ascites. 2. Apparent irregular hepatic capsule which  could be CT artifactual. Interval development of cirrhosis since 03/16/2014 is felt unlikely. Correlate with risk factors. 3. Cholelithiasis. 4. Anasarca.    Renal ultrasound 3/22: 1. Right nephrectomy and chronic left medical renal disease. 2. No hydronephrosis. 3. Known ascites.  Barium swallow 3/24: 1. Stable exam since 2007. 2. Esophageal dysmotility and dilation, but no akinesia typical of achalasia. Non-fixed narrowing at the GE junction, at least partly related to remote anti reflux surgery.  EGD 3/26: Retained food in esophagus, relieved endoscopically, suspect achalasia or severe motility disorder  Archie Patten, MD 07/04/2014, 7:21 AM PGY-1, [place] Intern pager: [phone], text pages welcome

## 2014-07-05 DIAGNOSIS — R509 Fever, unspecified: Secondary | ICD-10-CM

## 2014-07-05 DIAGNOSIS — Z992 Dependence on renal dialysis: Secondary | ICD-10-CM

## 2014-07-05 DIAGNOSIS — Z95828 Presence of other vascular implants and grafts: Secondary | ICD-10-CM | POA: Insufficient documentation

## 2014-07-05 LAB — RENAL FUNCTION PANEL
ANION GAP: 5 (ref 5–15)
Albumin: <1 g/dL — ABNORMAL LOW (ref 3.5–5.2)
BUN: 26 mg/dL — ABNORMAL HIGH (ref 6–23)
CO2: 27 mmol/L (ref 19–32)
Calcium: 7.4 mg/dL — ABNORMAL LOW (ref 8.4–10.5)
Chloride: 106 mmol/L (ref 96–112)
Creatinine, Ser: 5.05 mg/dL — ABNORMAL HIGH (ref 0.50–1.35)
GFR calc Af Amer: 13 mL/min — ABNORMAL LOW (ref >90)
GFR calc non Af Amer: 12 mL/min — ABNORMAL LOW (ref >90)
GLUCOSE: 108 mg/dL — AB (ref 70–99)
POTASSIUM: 3.3 mmol/L — AB (ref 3.5–5.1)
Phosphorus: 3.2 mg/dL (ref 2.3–4.6)
Sodium: 138 mmol/L (ref 135–145)

## 2014-07-05 LAB — CBC
HCT: 29.6 % — ABNORMAL LOW (ref 39.0–52.0)
HEMOGLOBIN: 9.5 g/dL — AB (ref 13.0–17.0)
MCH: 29.4 pg (ref 26.0–34.0)
MCHC: 32.1 g/dL (ref 30.0–36.0)
MCV: 91.6 fL (ref 78.0–100.0)
Platelets: 85 10*3/uL — ABNORMAL LOW (ref 150–400)
RBC: 3.23 MIL/uL — AB (ref 4.22–5.81)
RDW: 16.7 % — ABNORMAL HIGH (ref 11.5–15.5)
WBC: 7.1 10*3/uL (ref 4.0–10.5)

## 2014-07-05 LAB — URINE CULTURE
Colony Count: NO GROWTH
Culture: NO GROWTH

## 2014-07-05 LAB — PROTIME-INR
INR: 1.29 (ref 0.00–1.49)
PROTHROMBIN TIME: 16.2 s — AB (ref 11.6–15.2)

## 2014-07-05 MED ORDER — KIDNEY FAILURE BOOK
Freq: Once | Status: DC
  Filled 2014-07-05: qty 1

## 2014-07-05 NOTE — Progress Notes (Signed)
He states that he is tolerating his diet well. He is on a dysphagia 3 diet. I would recommend leaving him on this and not advancing it further. We will sign off. Call us if needed.

## 2014-07-05 NOTE — Progress Notes (Signed)
Family Medicine Teaching Service Daily Progress Note Intern Pager: [phone]  Patient name: Kenneth [name] Medical record number: [id number] Date of birth: 1955-07-22 Age: 59 y.o. Gender: male  Primary Care Provider: Cordelia Poche, MD Consultants: ID, nephrology, VVS, GI Code Status: Full  Pt Overview and Major Events to Date:  3/21: Admitted for lab abnormalities and concerning physical exam 3/23: Patient agreeable to HD 3/24: Barium swallow completed 3/25: INR now in therapeutic range; N/V 3/26: EGD demonstrates achalasia vs severe dysmotility 3/28: Dialysis catheter and AV fistula by vascular 3/29: First round of HD today; started bleeding from RIJ and L. Arm fistula site 3/30: reoccurrence of N/V 3/31: Botox injection of LES by GI;  4/1: HD,  2u PRBC for hgb 5.8, Tmax 101.1 4/3: Afebrile x 48 hours, Hgb 9.5, plan for HD 4/4  Assessment and Plan: Kenneth [name] is a 59 y.o. male presenting with cough and abdominal distension in clinic, found to have supratherapeutic INR to ~8 . PMH is significant for abdominal pain, anemia, DVT, CKD II, ED, HTN, hemorrhoids, HIV, HLD, lupus, PVCs, protein calorie malnutrition.  ESRD (new dx), on HD, s/p permanent acess 06/29/14 Most recent creatinine prior to admission 2.77 and GFR 28 03/20/14. However on admission Cr 9.56 and BUN 64. Patient continues to make some urine. 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 Renal US shows right surgical nephrectomy and left kidney with medical renal disease. Now receiving HD (started 3/29). S/p 1st stage BVT AVF on 3/28. - Followed by Nephrology ([facility]) - CLIPPED to go to [facility] MWF - Continue HD, next Monday 4/4 - trend BMP - Cr improved s/p HD - strict I/Os - first A-V fistula/graft procedure 3/29 was succesful without complications - stage 2 after basilic vein dilates with VVS (has appt scheduled), RIJ catheter placed for HD while fistula matures  LUE Edema, s/p Left  Basilic Vein Stage 1 Fistula Likely secondary to recent surgery with LUE fistula for HD access. Initially concern for possible cellulitis or site infection, however less likely, site looks improved today, seems functional, bruit heard. Edema extending to fingers, likely related to hematoma formation over L-wrist vein exploration site. Unlikely DVT, given otherwise asymptomatic and no pain. - Limited mobility in left arm since surgery - Ordered OT eval for mobility of left arm  Fever - Improved: - Recent spike in temp on 4/1 to Tmax of 101.1, since remained afebrile (low-grade 45F). Initially concern for s/p fistula placement, however site appears well without evidence of infection, erythema, or warmth, and no extension outside of marked area. See prior notes regarding Vancomycin, initially ordered pre-op, continued, now remains until BCx negative x 48 hours per Renal. Possible drug fever on Vanc - No anti-pyretics in past 48 hours - Vancomycin remains in system given ESRD (no dose today 4/3), if BCx negative on 4/4, will discontinue Vanc dose - BCx (4/1, x2) - NGTD pending - UA neg, Ur Cx (pending), CXR negative - f/u fever curve and vital signs   Anemia: Patient reports previously having blood transfusion about a month ago for anemia. He denies bleeding and notes they did not find a source for low Hbg. Denies any recent colonoscopy. Renal insufficiency likely playing a factor and also anemia of chronic disease. FH of colon cancer. Hbg baseline appears to be 8-10.  - s/p 2u PRBC transfused w/ HD on 4/1 (Hgb 5.8 > 10.0) - Currently Hgb 8.6 >> 9.5 - currently no source of blood loss (hematoma on  left arm appears stable.  - ESA and Aranesp 100 QTuesday - consider transfusing if Hbg <7 and/or patient symptomatic.   Nausea/Vomiting, secondary to achalasia - Improved  Recurrent issue for patient. CT with chronic esophageal dilation with fluid and debris within. EGD demonstrated retained food,  suggestive of achalasia vs severe dysmotility - s/p Botox injection of LES on 3/31 - tolerated dysphagia 3 diet well, continue on discharge - GI signed off 4/3  Recent DVT, h/o supra therapeutic INR (resolved): On admission, INR in clinic >8. Value later verified at 6.3. On coumadin for DVT in January in [place], no records for verification of details. Some concern in clinic of poor understanding of meds (taking when told to stop taking) / lost to f/u. No active bleeding  - Continue to monitor PT/INR - stable - Hold all anticoagulation including heparin due bleeding   - Re-evaluate patient need for treatment; at this time patient does not need any more theraupetic anticoagulation. Has received 3 months of treatment. Appears to have been unprovoked DVT  -Still awaiting records -SCDs only given recent bleeding  Thrombocytopenia: Plts dropped from 287->104->78>63>57>62. All prior labs plts >200.  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 He does have a  hematoma on left arm. - Plt improved 57 > 62 > pending (4/3) - HIT panel pending; Started heparin on 3/26 and discontinued on 3/29.   -score of 6 which correlates to high risk.  - continue to monitor for ongoing bleeding - holding heparin and pharmacologic anticoagulation - low threshold for platelet transfusion if continues to bleed  Fluid overload - Improved Unclear etiology. No reported dyspnea. Lung exam stable. Reports LE edema is somewhat chronic. Albumin 1.1. BNP 197.  Believe patient to be intracellularly deplete but with fluid overload. CT chest with small bilateral effusions and small pericardial effusion. Echo showing grade 1 diastolic dysfunction with normal EF. Stable and improved with HD from CXR on 4/1.  - encourage PO hydration - HD to help with fluid status - PT/OT eval - continue to evaluate and treat -  HHPT  Depression: Believe patient is suffering from  depression but patient declines wanting treatment at this time. This will likely affect health outcomes if not monitored/treated appropriately. - Will continue to encourage and support patient.   HIV: Unclear what medications he is taking from home med list as patient is unclear about this in clinic. Previously seen by Dr [name] (ID). States he was taking his HIV medications. Viral load and CD4 count appropriate. No concern at this time for HIV related opportunistic infections.  - ID signed off .> outpatient follow-up arranged.  - continue HAART therapy   HTN: Appears stable. - Continue home norvasc - Continue to monitor   Lupus: Recent dx per patient.   HLD - Continued home atorvastatin  FEN/GI: KVO dysphagia 3 diet; PPI BID Prophylaxis: SCDs  Disposition: Continue current management; pending negative blood cultures and stabilization of Hgb. Next HD scheduled Monday 4/4, anticipate possible DC to home at that time given CLIPPED for outpatient cont HD  Subjective:  Today patient doing well. Denies any overnight events, fevers/chills, pain or new concerns. Complains of persistent left upper ext swelling, without worsening or improvement. Does not feel warm and denies any significant pain. Limited movement of entire LUE, says he hasn't moved it much x 1 week. Tolerating dysphagia 3 diet well without concerns.  Objective: Temp:  [98.2 F (  36.8 C)-99.6 F (37.6 C)] 99.6 F (37.6 C) (04/03 0617) Pulse Rate:  [62-84] 84 (04/03 0617) Resp:  [12-16] 16 (04/03 0617) BP: (128-139)/(85-90) 128/85 mmHg (04/03 0617) SpO2:  [97 %-100 %] 97 % (04/03 0617) Weight:  [170 lb 13.7 oz (77.5 kg)] 170 lb 13.7 oz (77.5 kg) (04/03 0617) Physical Exam: General: Comfortable, pleasant, NAD HEENT: MMM Cardiovascular: RRR, no m/r/g Respiratory: CTAB, no wheezing, crackles, or rhonchi. Non-labored. Abdomen: +BS, soft, nontender, non-distended Extremities: minimal trace edema in BLE, no asymmetry or calf  tenderness, stable persistent LUE non-pitting edema mid arm to fingers, fistula in place left antecubital fossa (bruit heard and palpable thrill), left forearm with bruising and hematoma Skin: Left arm without significant erythema or warmth over left antecubital fossa, no extending erythema within marked area. No other rashes. Neuro: Awake, alert, grossly non-focal, limited movement in LUE ROM due to swelling, b/l grip intact, distal sensation intact   Intake/Output Summary (Last 24 hours) at 07/05/14 0831 Last data filed at 07/05/14 [id number]  Gross per 24 hour  Intake    360 ml  Output    200 ml  Net    160 ml   Laboratory: No results found for this or any previous visit (from the past 24 hour(s)).  PT - 17.4 INR 1.41  4/1 Blood culture x 2 - NGTD x1 day (pending)  4/2 UA - spec grav 1.025, protein >300, neg nitrite, neg leuks, large hgb, WBC 7-10, RBC 21-50, rare squam/bact  Imaging/Diagnostic Tests: Ct Chest and Abdomen Pelvis Wo Contrast 3/21:  CT CHEST IMPRESSION  1. Moderate degradation, secondary to lack of contrast, paucity of fat, and extent of anasarca. 2. Small bilateral pleural effusions with adjacent subsegmental atelectasis. 3. New pericardial effusion, small. 4. Chronic esophageal dilatation with fluid and debris within. This suggests a component of obstruction at the gastroesophageal junction and/or esophageal dysmotility.   CT ABDOMEN AND PELVIS IMPRESSION  1. Development of moderate abdominal pelvic ascites. 2. Apparent irregular hepatic capsule which could be CT artifactual. Interval development of cirrhosis since 03/16/2014 is felt unlikely. Correlate with risk factors. 3. Cholelithiasis. 4. Anasarca.    Renal ultrasound 3/22: 1. Right nephrectomy and chronic left medical renal disease. 2. No hydronephrosis. 3. Known ascites.  Barium swallow 3/24: 1. Stable exam since 2007. 2. Esophageal dysmotility and dilation, but no akinesia typical of achalasia. Non-fixed  narrowing at the GE junction, at least partly related to remote anti reflux surgery.  EGD 3/26: Retained food in esophagus, relieved endoscopically, suspect achalasia or severe motility disorder  4/1 CXR IMPRESSION: 1. Small right and small to moderate left pleural effusions, similar to prior. 2. Stable to minimally improved left lower lobe consolidation.  Olin Hauser, DO 07/05/2014, 8:31 AM PGY-2, [place] Intern pager: [phone], text pages welcome

## 2014-07-05 NOTE — Progress Notes (Signed)
[facility] Rounding Note Subjective:   Still some low grade fever with cultures negative to date Biggest complaint remains soreness in his arm - both the access site and the forearm exploration site (big hematoma there - he bled from heparin into the forearm and the entire arm is somewhat edematous) No further bleeding Due for HD tomorrow Kenneth [name] has been some talk of discharge tomorrow possibly  Objective Vital signs in last 24 hours: Filed Vitals:   07/04/14 1334 07/04/14 2116 07/05/14 0617 07/05/14 0955  BP: 139/88 135/90 128/85   Pulse: 81 62 84   Temp: 98.2 F (36.8 C) 99.2 F (37.3 C) 99.6 F (37.6 C) 99 F (37.2 C)  TempSrc: Oral Oral Oral Oral  Resp: 12 14 16    Height:      Weight:   77.5 kg (170 lb 13.7 oz)   SpO2: 100% 99% 97%    Weight change: -0.7 kg (-1 lb 8.7 oz)  Intake/Output Summary (Last 24 hours) at 07/05/14 1232 Last data filed at 07/05/14 1000  Gross per 24 hour  Intake    560 ml  Output    200 ml  Net    360 ml   Physical Exam:  BP 128/85 mmHg  Pulse 84  Temp(Src) 99 F (37.2 C) (Oral)  Resp 16  Ht 6\' 1"  (1.854 m)  Wt 77.5 kg (170 lb 13.7 oz)  BMI 22.55 kg/m2  SpO2 97%  NAD No JVD TDC site not tender/dsg intact Lungs grossly clear  Heart sounds distant S1S2 No S3 and no pericardial rub Abd soft, no focal tenderness at this time Trace edema LE's Left arm edema nearly to shoulder including hand and moderate hematoma left forearm where the vein was explored. L AVF with + bruit.    Labs:  Recent Labs Lab 06/30/14 0236 07/01/14 0510 07/02/14 0646 07/03/14 0616 07/03/14 0727 07/04/14 0547 07/05/14 0930  NA 137 142 140 137 139 139 138  K 4.3 3.9 4.1 4.0 4.1 3.7 3.3*  CL 113* 114* 110 108 107 105 106  CO2 17* 23 23 26 28 29 27   GLUCOSE 128* 94 89 114* 103* 89 108*  BUN 67* 48* 28* 34* 34* 21 26*  CREATININE 9.14* 7.21* 5.45* 5.99* 6.01* 4.19* 5.05*  CALCIUM 7.3* 7.2* 7.4* 7.1* 7.2* 7.1* 7.4*  PHOS 6.7* 5.1* 4.1 3.9  3.8 3.5 3.2     Recent Labs Lab 07/03/14 0727 07/04/14 0547 07/05/14 0930  ALBUMIN <1.0* <1.0* <1.0*    Recent Labs Lab 07/03/14 0727 07/03/14 1520 07/04/14 0547 07/05/14 0930  WBC 7.5 8.3 7.1 7.1  HGB 5.8* 10.0* 8.6* 9.5*  HCT 17.7* 30.3* 26.1* 29.6*  MCV 93.2 89.1 90.3 91.6  PLT 64* 57* 62* 85*     No results for input(s): IRON, TIBC, TRANSFERRIN, FERRITIN in the last 168 hours. Lab Results  Component Value Date   INR 1.29 07/05/2014   INR 1.41 07/04/2014   INR 1.75* 07/03/2014    Medications: . sodium chloride 10 mL/hr at 06/29/14 1137   . sodium chloride   Intravenous Once  . abacavir  600 mg Oral Daily  . atorvastatin  40 mg Oral q1800  . calcium acetate  1,334 mg Oral TID WC  . [START ON 07/08/2014] darbepoetin (ARANESP) injection - DIALYSIS  100 mcg Intravenous Q Wed-HD  . dolutegravir  50 mg Oral Daily  . lamiVUDine  50 mg Oral Daily  . multivitamin  1 tablet Oral QHS  . pantoprazole  40  mg Oral BID  . sodium chloride  3 mL Intravenous Q12H  . [START ON 07/06/2014] vancomycin  1,000 mg Intravenous Q M,W,F-HD    Background: 59 y.o. 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 Prior renal biopsy 2015 with diffuse proliferative lupus and a component of collapsing FSGS. Was on steroids and Cellcept in Dec when admitted here with pancytopenia and cellcept was held. Was on prednisone only after hospital discharge in December (discharge creatinine 2.77) then lost to local followup while staying in [place]. Recently returned, admitted 3/21 with abdominal pain, found to have progressive renal failure (creatinine close to 10) and deemed ESRD by Dr. Justin Mend.   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 Presented  with creatinine 9's Dr. Justin Mend deemed ESRD. Permanent access placed 06/29/14  [facility] + 1st stage BVT AVF by Dr.Early). HD#1 3/29 HD #2 3/30 and HD #3 4/2. He has been CLIPPED to [facility], MWF, 2nd shift. Next HD will be on Monday.  ACCESS: S/p 1st stage BVT AVF 3/28. Will need 1 month f/u at [facility], then 2nd stage of BVT at their discretion.  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 VDRA not indicated. Started binder.  Phos has normalized.                                                      HTN controlled   Anemia ESA started Aranesp 100 QTuesday. TSat 41 no iron needed  .Hb down to 5.8. Transfused 2 units on dialysis 4/1  Fever - low grade now. BC's neg. On vanco essentially since preop dose on 3/28 (due to no stop date on order)    Depression - declines treatment  Recent DVT with supratherapeutic INR on admission . Heparin stopped due to bleeding from catheter and surgical sites. .  Since had 3 months of treatment for DVT is to get no further anticoagulant.   Thrombocytopenia - HIT panel still pending. No heparin with HD. Block catheter with citrate, Platelets are up to 84  Achalasia - s/p Botox of LES by Dr. Amedeo Plenty  HIV - continue meds. Outpt ID followup.  Small pericardial effusion on chest CT, moderate on ECHO 3/23 and cannot exclude some tamponade physiology. Will need to watch BP carefully with hemodialysis  Jamal Maes, MD [facility] [phone] pager 07/05/2014, 12:32 PM

## 2014-07-06 DIAGNOSIS — Z436 Encounter for attention to other artificial openings of urinary tract: Secondary | ICD-10-CM

## 2014-07-06 LAB — CBC
HCT: 29.1 % — ABNORMAL LOW (ref 39.0–52.0)
HEMATOCRIT: 24 % — AB (ref 39.0–52.0)
Hemoglobin: 7.9 g/dL — ABNORMAL LOW (ref 13.0–17.0)
Hemoglobin: 9.3 g/dL — ABNORMAL LOW (ref 13.0–17.0)
MCH: 29.8 pg (ref 26.0–34.0)
MCH: 29.2 pg (ref 26.0–34.0)
MCHC: 32.9 g/dL (ref 30.0–36.0)
MCHC: 32 g/dL (ref 30.0–36.0)
MCV: 90.6 fL (ref 78.0–100.0)
MCV: 91.5 fL (ref 78.0–100.0)
PLATELETS: 94 10*3/uL — AB (ref 150–400)
Platelets: 73 10*3/uL — ABNORMAL LOW (ref 150–400)
RBC: 2.65 MIL/uL — AB (ref 4.22–5.81)
RBC: 3.18 MIL/uL — ABNORMAL LOW (ref 4.22–5.81)
RDW: 16.1 % — ABNORMAL HIGH (ref 11.5–15.5)
RDW: 16 % — ABNORMAL HIGH (ref 11.5–15.5)
WBC: 6.1 10*3/uL (ref 4.0–10.5)
WBC: 7.6 10*3/uL (ref 4.0–10.5)

## 2014-07-06 LAB — HEPARIN INDUCED THROMBOCYTOPENIA PNL: Heparin Induced Plt Ab: 0.644 OD — ABNORMAL HIGH (ref 0.000–0.400)

## 2014-07-06 LAB — PROTIME-INR
INR: 1.28 (ref 0.00–1.49)
Prothrombin Time: 16.1 seconds — ABNORMAL HIGH (ref 11.6–15.2)

## 2014-07-06 LAB — SEROTONIN RELEASE ASSAY (SRA)

## 2014-07-06 LAB — RETICULOCYTES
RBC.: 3.18 MIL/uL — ABNORMAL LOW (ref 4.22–5.81)
RETIC CT PCT: 2 % (ref 0.4–3.1)
Retic Count, Absolute: 63.6 10*3/uL (ref 19.0–186.0)

## 2014-07-06 LAB — RENAL FUNCTION PANEL
Anion gap: 6 (ref 5–15)
BUN: 32 mg/dL — AB (ref 6–23)
CALCIUM: 7 mg/dL — AB (ref 8.4–10.5)
CO2: 27 mmol/L (ref 19–32)
CREATININE: 5.33 mg/dL — AB (ref 0.50–1.35)
Chloride: 106 mmol/L (ref 96–112)
GFR calc Af Amer: 13 mL/min — ABNORMAL LOW (ref >90)
GFR calc non Af Amer: 11 mL/min — ABNORMAL LOW (ref >90)
Glucose, Bld: 92 mg/dL (ref 70–99)
PHOSPHORUS: 3.7 mg/dL (ref 2.3–4.6)
Potassium: 3.4 mmol/L — ABNORMAL LOW (ref 3.5–5.1)
Sodium: 139 mmol/L (ref 135–145)

## 2014-07-06 MED ORDER — CALCIUM ACETATE (PHOS BINDER) 667 MG PO CAPS: 1334.0000 mg | ORAL_CAPSULE | Freq: Three times a day (TID) | ORAL | Status: DC

## 2014-07-06 MED ORDER — ANTICOAGULANT SODIUM CITRATE 4% (200MG/5ML) IV SOLN
5.0000 mL | Freq: Once | Status: AC
Start: 2014-07-06 — End: 2014-07-06
  Administered 2014-07-06: 5 mL via INTRAVENOUS
  Filled 2014-07-06: qty 250

## 2014-07-06 NOTE — Evaluation (Signed)
Occupational Therapy Evaluation Patient Details Name: NYSIR KRISHNAMOORTHY MRN: [id number] DOB: [birth year] Today's Date: 07/06/2014    History of Present Illness pt is a 59 y/o male admitted for abdominal distension, but on admission noted to have supratherapeutic INR over 8.  Work up continues. Pt with new AV fistula LUE this admission as well as vein exploration at left wrist.   Clinical Impression   This 59 yo male admitted with above presents to acute OT with decreased range and use of LUE due to edema/hematomas from arm surgery above. He will benefit from acute OT for one more session to go over written exercise program and for ace wrapping of LUE (spoke with Dr. Donnetta Hutching by phone and he ok'd to re-ace wrap LUE--that was removed after surgery--but not too tight and ok'd pt to get arm sites wet).    Follow Up Recommendations  Home health OT    Equipment Recommendations  None recommended by OT       Precautions / Restrictions Precautions Precautions: Fall Precaution Comments: HIV Restrictions Weight Bearing Restrictions: No                              Pertinent Vitals/Pain Pain Assessment: 0-10 Pain Score: 3  Pain Location: L arm where 2 surgical sites are with increased range in both Pain Descriptors / Indicators: Sore Pain Intervention(s): Monitored during session     Hand Dominance Right   Extremity/Trunk Assessment Upper Extremity Assessment Upper Extremity Assessment: LUE deficits/detail LUE Deficits / Details: Pt with edematous arm from fingers up to 1/4 above elbow; decreased supination; decreased finger flexion, decreased elbowflexion/extension, decreased wrist flexion/extension; WNL shoulder movements. LUE Coordination: decreased fine motor;decreased gross motor           Communication Communication Communication: No difficulties   Cognition Arousal/Alertness: Awake/alert Behavior During Therapy: WFL for tasks assessed/performed (slow to  respond) Overall Cognitive Status: Within Functional Limits for tasks assessed                                Home Living Family/patient expects to be discharged to:: Private residence Living Arrangements: Alone   Type of Home:  (condo) Home Access: Level entry     Home Layout: One level     Bathroom Shower/Tub: Occupational psychologist: Standard Bathroom Accessibility: Yes   Home Equipment: Shower seat - built in          Prior Functioning/Environment Level of Independence: Independent with assistive device(s)        Comments: drives; 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    OT Diagnosis: Generalized weakness;Acute pain   OT Problem List: Decreased strength;Decreased range of motion;Impaired UE functional use;Pain   OT Treatment/Interventions: Self-care/ADL training;Patient/family education;Balance training;Therapeutic activities;Therapeutic exercise    OT Goals(Current goals can be found in the care plan section) Acute Rehab OT Goals Patient Stated Goal: to work on swelling of my arm OT Goal Formulation: With patient Time For Goal Achievement: 07/13/14 Potential to Achieve Goals: Good  OT Frequency: Min 2X/week              End of Session Nurse Communication:  (I have ordered ace wraps for pt's LUE)  Activity Tolerance: Patient tolerated treatment well Patient left: in bed;with call bell/phone within reach   Time: [id number] OT Time Calculation (min): 34 min Charges:  OT General Charges $OT Visit: 1 Procedure  OT Treatments $Therapeutic Exercise: 8-22 mins   Almon Register [id number] 07/06/2014, 1:36 PM

## 2014-07-06 NOTE — Discharge Instructions (Signed)
End-Stage Kidney Disease °The kidneys are two organs that lie on either side of the spine between the middle of the back and the front of the abdomen. The kidneys:  °· Remove wastes and extra water from the blood.   °· Produce important hormones. These help keep bones strong, regulate blood pressure, and help create red blood cells.   °· Balance the fluids and chemicals in the blood and tissues. °End-stage kidney disease occurs when the kidneys are so damaged that they cannot do their job. When the kidneys cannot do their job, life-threatening problems occur. The body cannot stay clean and strong without the help of the kidneys. In end-stage kidney disease, the kidneys cannot get better. You need a new kidney or treatments to do some of the work healthy kidneys do in order to stay alive. °CAUSES  °End-stage kidney disease usually occurs when a long-lasting (chronic) kidney disease gets worse. It may also occur after the kidneys are suddenly damaged (acute kidney injury).  °SYMPTOMS  °· Swelling (edema) of the legs, ankles, or feet.   °· Tiredness (lethargy).   °· Nausea or vomiting.   °· Confusion.   °· Problems with urination, such as:   °¨ Decreased urine production.   °¨ Frequent urination, especially at night.   °¨ Frequent accidents in children who are potty trained.   °· Muscle twitches and cramps.   °· Persistent itchiness.   °· Loss of appetite.   °· Headaches.   °· Abnormally dark or light skin.   °· Numbness in the hands or feet.   °· Easy bruising.   °· Frequent hiccups.   °· Menstruation stops. °DIAGNOSIS  °Your health care provider will measure your blood pressure and take some tests. These may include:  °· Urine tests.   °· Blood tests.   °· Imaging tests, such as:   °¨ An ultrasound exam.   °¨ Computed tomography (CT). °· A kidney biopsy. °TREATMENT  °There are two treatments for end-stage kidney disease:  °· A procedure that removes toxic wastes from the body (dialysis).   °· Receiving a new kidney  (kidney transplant). °Both of these treatments have serious risks and consequences. Your health care provider will help you determine which treatment is best for you based on your health, age, and other factors. In addition to having dialysis or a kidney transplant, you may need to take medicines to control high blood pressure (hypertension) and cholesterol and to decrease phosphorus levels in your blood.  °HOME CARE INSTRUCTIONS °· Follow your prescribed diet.   °· Take medicines only as directed by your health care provider.   °· Do not take any new medicines (prescription, over-the-counter, or nutritional supplements) unless approved by your health care provider. Many medicines can worsen your kidney damage or need to have the dose adjusted.   °· Keep all follow-up visits as directed by your health care provider. °MAKE SURE YOU: °· Understand these instructions. °· Will watch your condition. °· Will get help right away if you are not doing well or get worse. °Document Released: 06/10/2003 Document Revised: 08/04/2013 Document Reviewed: 11/17/2011 °ExitCare® Patient Information ©2015 [employer]. This information is not intended to replace advice given to you by your health care provider. Make sure you discuss any questions you have with your health care provider. ° °

## 2014-07-06 NOTE — Progress Notes (Signed)
Hemodialysis. Hemoglobin level of 7.9, Dr. Mercy Moore notified. No new orders were received. Will continue to monitor the patient.

## 2014-07-06 NOTE — Progress Notes (Signed)
07/06/14 OT recommended HHOT. Received HHOT order, contacted Miranda at [facility] and set up [place] in addition to [place].  07/03/14 Spoke with patient about HHC, he selected [facility] for HHPT. Contacted Miranda at [facility] and set up [place].

## 2014-07-06 NOTE — Progress Notes (Signed)
Pt. Was going to be D/C'd today, but asked to stay overnight due to D/C being so late. MD stated that would be okay and pt. Will be D/C'd in the AM. Given report to night time nurse.

## 2014-07-06 NOTE — Progress Notes (Signed)
ANTIBIOTIC CONSULT NOTE - FOLLOW UP  Pharmacy Consult for Vancomycin Indication: R/O graft infection  Allergies  Allergen Reactions  . Amoxicillin     REACTION: diffuse rash    Patient Measurements: Height: 6\' 1"  (185.4 cm) Weight: 167 lb 5.3 oz (75.9 kg) IBW/kg (Calculated) : 79.9 Adjusted Body Weight:   Vital Signs: Temp: 99.1 F (37.3 C) (04/04 1311) Temp Source: Oral (04/04 1311) BP: 123/94 mmHg (04/04 1311) Pulse Rate: 78 (04/04 1311) Intake/Output from previous day: 04/03 0701 - 04/04 0700 In: 320 [P.O.:320] Out: 350 [Urine:350] Intake/Output from this shift: Total I/O In: -  Out: 3000 [Other:3000]  Labs:  Recent Labs  07/04/14 0547 07/05/14 0930 07/06/14 0700  WBC 7.1 7.1 6.1  HGB 8.6* 9.5* 7.9*  PLT 62* 85* 94*  CREATININE 4.19* 5.05* 5.33*   Estimated Creatinine Clearance: 16.4 mL/min (by C-G formula based on Cr of 5.33).  Recent Labs  07/03/14 1520  VANCORANDOM 29.7     Microbiology: Recent Results (from the past 720 hour(s))  Surgical pcr screen     Status: None   Collection Time: 06/28/14  7:48 PM  Result Value Ref Range Status   MRSA, PCR NEGATIVE NEGATIVE Final   Staphylococcus aureus NEGATIVE NEGATIVE Final    Comment:        The Xpert SA Assay (FDA approved for NASAL specimens in patients over 57 years of age), is one component of a comprehensive surveillance program.  Test performance has been validated by [facility] for patients greater than or equal to 64 year old. It is not intended to diagnose infection nor to guide or monitor treatment.   Culture, blood (routine x 2)     Status: None (Preliminary result)   Collection Time: 07/03/14  7:10 AM  Result Value Ref Range Status   Specimen Description BLOOD HEMODIALYSIS CATHETER  Final   Special Requests BOTTLES DRAWN AEROBIC AND ANAEROBIC 10ML EACH  Final   Culture   Final           BLOOD CULTURE RECEIVED NO GROWTH TO DATE CULTURE WILL BE HELD FOR 5 DAYS BEFORE ISSUING A  FINAL NEGATIVE REPORT Performed at [facility]    Report Status PENDING  Incomplete  Culture, blood (routine x 2)     Status: None (Preliminary result)   Collection Time: 07/03/14  7:20 AM  Result Value Ref Range Status   Specimen Description BLOOD HEMODIALYSIS CATHETER  Final   Special Requests BOTTLES DRAWN AEROBIC AND ANAEROBIC 10ML EACH  Final   Culture   Final           BLOOD CULTURE RECEIVED NO GROWTH TO DATE CULTURE WILL BE HELD FOR 5 DAYS BEFORE ISSUING A FINAL NEGATIVE REPORT Performed at [facility]    Report Status PENDING  Incomplete  Urine culture     Status: None   Collection Time: 07/04/14 12:22 AM  Result Value Ref Range Status   Specimen Description URINE, RANDOM  Final   Special Requests NONE  Final   Colony Count NO GROWTH Performed at [facility]   Final   Culture NO GROWTH Performed at [facility]   Final   Report Status 07/05/2014 FINAL  Final    Anti-infectives    Start     Dose/Rate Route Frequency Ordered Stop   07/06/14 1200  vancomycin (VANCOCIN) IVPB 1000 mg/200 mL premix     1,000 mg 200 mL/hr over 60 Minutes Intravenous Every M-W-F (Hemodialysis) 07/03/14 1612  07/02/14 0000  lamiVUDine (EPIVIR) 10 MG/ML solution     50 mg Oral Daily 07/02/14 1223     07/02/14 0000  dolutegravir (TIVICAY) 50 MG tablet     50 mg Oral Daily 07/02/14 1223     07/02/14 0000  abacavir (ZIAGEN) 300 MG tablet     600 mg Oral Daily 07/02/14 1223     06/29/14 0600  vancomycin (VANCOCIN) IVPB 1000 mg/200 mL premix  Status:  Discontinued     1,000 mg 200 mL/hr over 60 Minutes Intravenous On call to O.R. 06/28/14 0844 07/03/14 1025   06/25/14 1000  lamiVUDine (EPIVIR) 10 MG/ML solution 50 mg     50 mg Oral Daily 06/24/14 0958     06/22/14 1830  abacavir (ZIAGEN) tablet 600 mg     600 mg Oral Daily 06/22/14 1728     06/22/14 1830  lamiVUDine (EPIVIR) 10 MG/ML solution 100 mg  Status:  Discontinued     100 mg Oral Daily 06/22/14  1728 06/24/14 0958   06/22/14 1830  dolutegravir (TIVICAY) tablet 50 mg     50 mg Oral Daily 06/22/14 1728     06/22/14 1800  fluconazole (DIFLUCAN) tablet 100 mg  Status:  Discontinued     100 mg Oral Weekly 06/22/14 1728 06/23/14 1831      Assessment: 59yo male with ESRD on Vancomycin 750mg  IV MWF with HD.  He has already received HD today x 4hr at BFR 400.  Blood and Urine cx are ntd.  Pt is AFebrile with WBC wnl.  Goal of Therapy:  Pre-HD Vanc level 15-25  Plan:  Continue Vancomcyin 750mg  IV qHD Plan to check ss pre-HD Vanc level when appropriate F/U culture results  Gracy Bruins, PharmD Clinical Pharmacist [facility]

## 2014-07-06 NOTE — Patient Instructions (Signed)
AROM: Elbow Flexion / Extension   With left hand palm up, gently bend elbow as far as possible. Then straighten arm as far as possible. Repeat __10__ times per set. Do __1__ sets per session. Do ___5_ sessions per day.  Copyright  VHI. All rights reserved.  AROM: Forearm Pronation / Supination   With right arm in handshake position, slowly rotate palm down until stretch is felt. Relax. Then rotate palm up until stretch is felt. Repeat _10___ times per set. Do __1__ sets per session. Do __5__ sessions per day.  Copyright  VHI. All rights reserved.  AROM: Wrist Flexion / Extension   Actively bend right wrist forward then back as far as possible. Repeat __10__ times per set. Do __1__ sets per session. Do __5__ sessions per day.  Copyright  VHI. All rights reserved.  AROM: Finger Flexion / Extension    Finger Flexion / Extension   With palm up, bend fingers of left hand toward palm, making a loose fist. Straighten fingers, opening fist. Repeat sequence 10 times per session. Do __5__ sessions per day. Hand Variation: Palm down   Copyright  VHI. All rights reserved.

## 2014-07-06 NOTE — Progress Notes (Signed)
Family Medicine Teaching Service Daily Progress Note Intern Pager: [phone]  Patient name: Kenneth [name] Medical record number: [id number] Date of birth: Nov 11, 1955 Age: 59 y.o. Gender: male  Primary Care Provider: Cordelia Poche, MD Consultants: ID, nephrology, VVS, GI Code Status: Full  Pt Overview and Major Events to Date:  3/21: Admitted for lab abnormalities and concerning physical exam 3/23: Patient agreeable to HD 3/24: Barium swallow completed 3/25: INR now in therapeutic range; N/V 3/26: EGD demonstrates achalasia vs severe dysmotility 3/28: Dialysis catheter and AV fistula by vascular 3/29: First round of HD today; started bleeding from RIJ and L. Arm fistula site 3/30: reoccurrence of N/V 3/31: Botox injection of LES by GI;  4/1: HD,  2u PRBC for hgb 5.8, Tmax 101.1 4/3: Afebrile x 48 hours, Hgb 9.5, plan for HD 4/4  Assessment and Plan: 59 y.o. male presenting with cough and abdominal distension in clinic, found to have supratherapeutic INR to ~8 . PMH is significant for abdominal pain, anemia, DVT, CKD II, ED, HTN, hemorrhoids, HIV, HLD, lupus, PVCs, protein calorie malnutrition.  # ESRD (new dx), on HD, s/p permanent acess 06/29/14, Most recent creatinine prior to admission 2.77 and GFR 28 03/20/14. However on admission Cr 9.56 and BUN 64. 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 Now receiving HD (started 3/29). S/p 1st stage BVT AVF on 3/28. - Renal US shows right surgical nephrectomy and left kidney with medical renal disease.  - Followed by Nephrology ([facility]) - CLIPPED to go to [facility] MWF - Continue HD, next today - Creatinine 5.33 today, prior to HD - Strict I/Os - First A-V fistula/graft procedure 3/29 was succesful without complications - stage 2 after basilic vein dilates with VVS (has appt scheduled), RIJ catheter placed for HD while fistula matures  # LUE Edema, s/p Left Basilic Vein Stage 1 Fistula, Likely secondary to recent  surgery with LUE fistula for HD access. Initially concern for possible cellulitis or site infection, however less likely, site looks improved today, seems functional, bruit heard. Edema extending to fingers, likely related to hematoma formation over L-wrist vein exploration site. Unlikely DVT, given otherwise asymptomatic and no pain. - Limited mobility in left arm since surgery - Ordered OT eval for mobility of left arm  # Fever - Improved: Recent spike in temp on 4/1 to Tmax of 101.1, since remained afebrile. Initially concern for s/p fistula placement, however site appears well without evidence of infection, erythema, or warmth, and no extension outside of marked area. See prior notes regarding Vancomycin, initially ordered pre-op, continued, now remains until BCx negative x 48 hours per Renal. Possible drug fever on Vanc - No anti-pyretics in past 48 hours - Vancomycin remains in system given ESRD (no dose today 4/3), if BCx negative on 4/4, will discontinue Vanc dose - BCx (4/1, x2) - NGTD pending - UA neg, Ur Cx (pending), CXR negative - f/u fever curve and vital signs   # Anemia: Patient reports previously having blood transfusion about a month ago for anemia. He denies bleeding and notes they did not find a source for low Hbg. Denies any recent colonoscopy. Renal insufficiency likely playing a factor and also anemia of chronic disease. FH of colon cancer. Hbg baseline appears to be 8-10. FOBT negative. - s/p 2u PRBC transfused w/ HD on 4/1 (Hgb 5.8 > 10.0) - Hemoglobin 7.9 today (down from 9.5 yesterday) - Currently no source of blood loss (hematoma on left arm appears stable) - ESA and  Aranesp 100 QTuesday - Consider transfusing if Hbg <7 and/or patient symptomatic.   # Nausea/Vomiting, secondary to achalasia - Improved  Recurrent issue for patient. CT with chronic esophageal dilation with fluid and debris within. EGD demonstrated retained food, suggestive of achalasia vs severe  dysmotility - s/p Botox injection of LES on 3/31 - Tolerated dysphagia 3 diet well, continue on discharge - GI signed off 4/3  # Thrombocytopenia: Plts dropped from 287->104->78>63>57>62. All prior labs plts >200.  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 He does have a  hematoma on left arm. - Plt improved 57 > 62 > 94 - HIT panel pending; Started heparin on 3/26 and discontinued on 3/29.   -score of 6 which correlates to high risk.  - Continue to monitor for ongoing bleeding - Holding heparin and pharmacologic anticoagulation - Low threshold for platelet transfusion if continues to bleed  FEN/GI: KVO dysphagia 3 diet; PPI BID Prophylaxis: SCDs  Disposition: Continue current management; pending negative blood cultures and stabilization of Hgb. Next HD scheduled Monday 4/4, anticipate possible DC to home at that time given CLIPPED for outpatient cont HD  Subjective:  Feels well today. Feels comfortable with discharge home with HD. No further concerns.   Objective: Temp:  [98.4 F (36.9 C)-99.2 F (37.3 C)] 98.9 F (37.2 C) (04/04 0653) Pulse Rate:  [78-91] 78 (04/04 0800) Resp:  [15-18] 18 (04/04 0800) BP: (124-144)/(84-92) 133/86 mmHg (04/04 0800) SpO2:  [99 %-100 %] 99 % (04/04 0653) Weight:  [171 lb 12.8 oz (77.928 kg)-174 lb 9.7 oz (79.2 kg)] 174 lb 9.7 oz (79.2 kg) (04/04 [id number]) Physical Exam: General: Comfortable, pleasant, NAD HEENT: MMM Cardiovascular: RRR, no m/r/g Respiratory: CTAB, no wheezing, crackles, or rhonchi. Non-labored. Abdomen: +BS, soft, nontender, non-distended Extremities: minimal trace edema in BLE, no asymmetry or calf tenderness, stable persistent LUE non-pitting edema mid arm to fingers, fistula in place left antecubital fossa (bruit heard and palpable thrill), left forearm with bruising and hematoma Skin: Left arm without significant erythema or warmth over left antecubital fossa, no  extending erythema within marked area. No other rashes. Neuro: Awake, alert, grossly non-focal, limited movement in LUE ROM due to swelling, b/l grip intact, distal sensation intact   Intake/Output Summary (Last 24 hours) at 07/06/14 0832 Last data filed at 07/06/14 0500  Gross per 24 hour  Intake    320 ml  Output    350 ml  Net    -30 ml   Laboratory: Results for orders placed or performed during the hospital encounter of 06/22/14 (from the past 24 hour(s))  Protime-INR     Status: Abnormal   Collection Time: 07/05/14  9:30 AM  Result Value Ref Range   Prothrombin Time 16.2 (H) 11.6 - 15.2 seconds   INR 1.29 0.00 - 1.49  CBC     Status: Abnormal   Collection Time: 07/05/14  9:30 AM  Result Value Ref Range   WBC 7.1 4.0 - 10.5 K/uL   RBC 3.23 (L) 4.22 - 5.81 MIL/uL   Hemoglobin 9.5 (L) 13.0 - 17.0 g/dL   HCT 29.6 (L) 39.0 - 52.0 %   MCV 91.6 78.0 - 100.0 fL   MCH 29.4 26.0 - 34.0 pg   MCHC 32.1 30.0 - 36.0 g/dL   RDW 16.7 (H) 11.5 - 15.5 %   Platelets 85 (L) 150 - 400 K/uL  Renal function panel     Status:  Abnormal   Collection Time: 07/05/14  9:30 AM  Result Value Ref Range   Sodium 138 135 - 145 mmol/L   Potassium 3.3 (L) 3.5 - 5.1 mmol/L   Chloride 106 96 - 112 mmol/L   CO2 27 19 - 32 mmol/L   Glucose, Bld 108 (H) 70 - 99 mg/dL   BUN 26 (H) 6 - 23 mg/dL   Creatinine, Ser 5.05 (H) 0.50 - 1.35 mg/dL   Calcium 7.4 (L) 8.4 - 10.5 mg/dL   Phosphorus 3.2 2.3 - 4.6 mg/dL   Albumin <1.0 (L) 3.5 - 5.2 g/dL   GFR calc non Af Amer 12 (L) >90 mL/min   GFR calc Af Amer 13 (L) >90 mL/min   Anion gap 5 5 - 15  Protime-INR     Status: Abnormal   Collection Time: 07/06/14  7:00 AM  Result Value Ref Range   Prothrombin Time 16.1 (H) 11.6 - 15.2 seconds   INR 1.28 0.00 - 1.49  CBC     Status: Abnormal   Collection Time: 07/06/14  7:00 AM  Result Value Ref Range   WBC 6.1 4.0 - 10.5 K/uL   RBC 2.65 (L) 4.22 - 5.81 MIL/uL   Hemoglobin 7.9 (L) 13.0 - 17.0 g/dL   HCT 24.0 (L)  39.0 - 52.0 %   MCV 90.6 78.0 - 100.0 fL   MCH 29.8 26.0 - 34.0 pg   MCHC 32.9 30.0 - 36.0 g/dL   RDW 16.1 (H) 11.5 - 15.5 %   Platelets 94 (L) 150 - 400 K/uL  Renal function panel     Status: Abnormal   Collection Time: 07/06/14  7:00 AM  Result Value Ref Range   Sodium 139 135 - 145 mmol/L   Potassium 3.4 (L) 3.5 - 5.1 mmol/L   Chloride 106 96 - 112 mmol/L   CO2 27 19 - 32 mmol/L   Glucose, Bld 92 70 - 99 mg/dL   BUN 32 (H) 6 - 23 mg/dL   Creatinine, Ser 5.33 (H) 0.50 - 1.35 mg/dL   Calcium 7.0 (L) 8.4 - 10.5 mg/dL   Phosphorus 3.7 2.3 - 4.6 mg/dL   Albumin <1.0 (L) 3.5 - 5.2 g/dL   GFR calc non Af Amer 11 (L) >90 mL/min   GFR calc Af Amer 13 (L) >90 mL/min   Anion gap 6 5 - 15    PT - 17.4 INR 1.41  4/1 Blood culture x 2 - NGTD x1 day (pending)  4/2 UA - spec grav 1.025, protein >300, neg nitrite, neg leuks, large hgb, WBC 7-10, RBC 21-50, rare squam/bact  Imaging/Diagnostic Tests: Ct Chest and Abdomen Pelvis Wo Contrast 3/21:  CT CHEST IMPRESSION  1. Moderate degradation, secondary to lack of contrast, paucity of fat, and extent of anasarca. 2. Small bilateral pleural effusions with adjacent subsegmental atelectasis. 3. New pericardial effusion, small. 4. Chronic esophageal dilatation with fluid and debris within. This suggests a component of obstruction at the gastroesophageal junction and/or esophageal dysmotility.   CT ABDOMEN AND PELVIS IMPRESSION  1. Development of moderate abdominal pelvic ascites. 2. Apparent irregular hepatic capsule which could be CT artifactual. Interval development of cirrhosis since 03/16/2014 is felt unlikely. Correlate with risk factors. 3. Cholelithiasis. 4. Anasarca.    Renal ultrasound 3/22: 1. Right nephrectomy and chronic left medical renal disease. 2. No hydronephrosis. 3. Known ascites.  Barium swallow 3/24: 1. Stable exam since 2007. 2. Esophageal dysmotility and dilation, but no akinesia typical of achalasia. Non-fixed narrowing  at the GE junction, at least partly related to remote anti reflux surgery.  EGD 3/26: Retained food in esophagus, relieved endoscopically, suspect achalasia or severe motility disorder  4/1 CXR IMPRESSION: 1. Small right and small to moderate left pleural effusions, similar to prior. 2. Stable to minimally improved left lower lobe consolidation.  [place], DO 07/06/2014, 8:32 AM PGY-1, [facility] Intern pager: [phone], text pages welcome

## 2014-07-06 NOTE — Progress Notes (Signed)
Occupational Therapy Treatment Patient Details Name: ASAAD WOHLER MRN: [id number] DOB: 09-Feb-1956 Today's Date: 07/06/2014    History of present illness pt is a 59 y/o male admitted for abdominal distension, but on admission noted to have supratherapeutic INR over 8.  Work up continues. Pt with new AV fistula LUE this admission as well as vein exploration at left wrist.   OT comments  This 59 yo admitted with above seen 2nd time today to apply ace wraps, go over exercises sheets with pt practice, and to re-iterate LUE optimal positioning. If he stays overnight we will see him again tomorrow, if not I have recommended HHOT to follow him.  Follow Up Recommendations  Home health OT    Equipment Recommendations  None recommended by OT       Precautions / Restrictions Precautions Precautions: Fall Precaution Comments: HIV Restrictions Weight Bearing Restrictions: No       Mobility Bed Mobility Overal bed mobility: Independent Bed Mobility: Supine to Sit     Supine to sit: Independent        Transfers Overall transfer level: Independent Equipment used: None Transfers: Sit to/from Stand Sit to Stand: Independent                  ADL Overall ADL's : At baseline (Pt needs to be encouraged to try and use his LUE due to pain)                                                        Cognition   Behavior During Therapy: [facility] for tasks assessed/performed Overall Cognitive Status: Within Functional Limits for tasks assessed                       Extremity/Trunk Assessment  Upper Extremity Assessment Upper Extremity Assessment: LUE deficits/detail LUE Deficits / Details: Pt with edematous arm from fingers up to 1/4 above elbow; decreased supination; decreased finger flexion, decreased elbowflexion/extension, decreased wrist flexion/extension; WNL shoulder movements. LUE Coordination: decreased fine motor;decreased gross motor             Exercises Other Exercises Other Exercises: Pt given handout for LUE elbow flexion/extension, forearm supination/pronation, wrist flexion/extension, digit flexion/extension and I also gave him a squeeze ball for his left hand. Instructions on sheets and verbally are for pt to do these exercised 10 reps 5x/day with the OK that he can do them more, but should not do them less. Pt returned demomonstrated exercises. Other Exercises: Also instructed pt in positioning of LUE for optimal edema control            Pertinent Vitals/ Pain       Pain Assessment: Faces Pain Score: 3  Faces Pain Scale: Hurts even more Pain Location: left arm where 2 surgical sites are with increased pain with exercises Pain Descriptors / Indicators: Sore Pain Intervention(s): Monitored during session  Home Living Family/patient expects to be discharged to:: Private residence Living Arrangements: Alone   Type of Home:  (condo) Home Access: Level entry     Home Layout: One level     Bathroom Shower/Tub: Occupational psychologist: Standard Bathroom Accessibility: Yes   Home Equipment: Shower seat - built in          Prior Functioning/Environment Level of Independence: Independent with  assistive device(s)        Comments: drives; 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     Progress Toward Goals  OT Goals(current goals can now be found in the care plan section)   Making progress  Acute Rehab OT Goals Patient Stated Goal: to work on swelling of my arm OT Goal Formulation: With patient Time For Goal Achievement: 07/13/14 Potential to Achieve Goals: Good ADL Goals Pt Will Perform Grooming: with modified independence;standing Pt Will Perform Lower Body Dressing: with modified independence;sit to/from stand Pt Will Transfer to Toilet: with modified independence;ambulating;regular height toilet;grab bars Pt Will Perform Toileting - Clothing Manipulation and hygiene: with  modified independence;sit to/from stand Pt/caregiver will Perform Home Exercise Program: Increased ROM;Left upper extremity;With written HEP provided (decrease edema)  Plan Discharge plan remains appropriate          Activity Tolerance Patient tolerated treatment well   Patient Left  (sitting EOB eating lunch)   Nurse Communication Other (comment) (Ace wrap applied LUE, pt to let RN know if issue with it)        Time: 1400-1427 OT Time Calculation (min): 27 min  Charges: OT General Charges $OT Visit: 1 Procedure OT Treatments $Therapeutic Exercise: 23-37 mins  Almon Register [id number] 07/06/2014, 2:50 PM

## 2014-07-06 NOTE — Procedures (Signed)
Pt seen on HD.  Ap 180 Vp 110   BFR 400.  Set up for outpt HD at [facility] MWF 2nd shift

## 2014-07-06 NOTE — Progress Notes (Signed)
NUTRITION FOLLOW UP  Intervention:   -Continue with current plan of care  Nutrition Dx:   Increased nutrient needs related to chronic illness as evidenced by estimated energy needs; ongoing  Goal:   Pt to meet >/= 90% of estimated energy needs; met  Monitor:   PO intake, weight trends, labs  Assessment:   59 y/o male with history of lupus and HIV that was seen in the clinic and sent to the hospital d/t abdominal distention and found to have a supratherapeutic INR.   Pt s/p repeat EGD on 07/02/14 due to esophageal obstruction. He also received botox injection. He remains on a dysphagia 3 diet and is tolerating well. Noted 75-100% of meal completion.  Per RNCM, pt has been set-up with home health PT/OT and outpatient HD at [facility]. Pt is approaching discharge.  Labs reviewed. K: 3.4, BUN/Creat: 32/5.33, Calcium: 7.0.   Height: Ht Readings from Last 1 Encounters:  06/22/14 6' 1"  (1.854 m)    Weight Status:   Wt Readings from Last 1 Encounters:  07/06/14 167 lb 5.3 oz (75.9 kg)   06/30/14 173 lb 15.1 oz (78.9 kg)         Re-estimated needs:  Kcal: 2000-2200 Protein: 90-100 grams Fluid: >1.5 L   Skin: closed incision on rt chest, closed incision on lt arm  Diet Order: DIET DYS 3 Room service appropriate?: Yes; Fluid consistency:: Thin   Intake/Output Summary (Last 24 hours) at 07/06/14 1410 Last data filed at 07/06/14 1100  Gross per 24 hour  Intake      0 ml  Output   3200 ml  Net  -3200 ml    Last BM: 07/04/14   Labs:   Recent Labs Lab 07/04/14 0547 07/05/14 0930 07/06/14 0700  NA 139 138 139  K 3.7 3.3* 3.4*  CL 105 106 106  CO2 29 27 27   BUN 21 26* 32*  CREATININE 4.19* 5.05* 5.33*  CALCIUM 7.1* 7.4* 7.0*  PHOS 3.5 3.2 3.7  GLUCOSE 89 108* 92    CBG (last 3)  No results for input(s): GLUCAP in the last 72 hours.  Scheduled Meds: . sodium chloride   Intravenous Once  . abacavir  600 mg Oral Daily  . atorvastatin  40 mg  Oral q1800  . calcium acetate  1,334 mg Oral TID WC  . [START ON 07/08/2014] darbepoetin (ARANESP) injection - DIALYSIS  100 mcg Intravenous Q Wed-HD  . dolutegravir  50 mg Oral Daily  . kidney failure book   Does not apply Once  . lamiVUDine  50 mg Oral Daily  . multivitamin  1 tablet Oral QHS  . pantoprazole  40 mg Oral BID  . sodium chloride  3 mL Intravenous Q12H  . vancomycin  1,000 mg Intravenous Q M,W,F-HD    Continuous Infusions: . sodium chloride 10 mL/hr at 06/29/14 1137    Josejulian Tarango A. Jimmye Norman, RD, LDN, CDE Pager: [phone] After hours Pager: [phone]

## 2014-07-07 LAB — PROTIME-INR
INR: 1.25 (ref 0.00–1.49)
Prothrombin Time: 15.8 seconds — ABNORMAL HIGH (ref 11.6–15.2)

## 2014-07-07 LAB — CBC
HCT: 23.3 % — ABNORMAL LOW (ref 39.0–52.0)
HEMOGLOBIN: 7.6 g/dL — AB (ref 13.0–17.0)
MCH: 29.7 pg (ref 26.0–34.0)
MCHC: 32.6 g/dL (ref 30.0–36.0)
MCV: 91 fL (ref 78.0–100.0)
Platelets: 83 10*3/uL — ABNORMAL LOW (ref 150–400)
RBC: 2.56 MIL/uL — ABNORMAL LOW (ref 4.22–5.81)
RDW: 15.7 % — AB (ref 11.5–15.5)
WBC: 5.6 10*3/uL (ref 4.0–10.5)

## 2014-07-07 LAB — HAPTOGLOBIN: Haptoglobin: 261 mg/dL — ABNORMAL HIGH (ref 34–200)

## 2014-07-07 NOTE — Progress Notes (Signed)
S: Feels well.  Wants to go home O:BP 134/81 mmHg  Pulse 71  Temp(Src) 98.8 F (37.1 C) (Oral)  Resp 16  Ht 6\' 1"  (1.854 m)  Wt 76.476 kg (168 lb 9.6 oz)  BMI 22.25 kg/m2  SpO2 97%  Intake/Output Summary (Last 24 hours) at 07/07/14 1048 Last data filed at 07/06/14 2200  Gross per 24 hour  Intake    640 ml  Output   3200 ml  Net  -2560 ml   Weight change: -2.028 kg (-4 lb 7.5 oz) [id number] and alert CVS:rrr Resp:Clear Abd:+ BS NTND Ext: 1+ edema.  Lt arm still swollen from BVT.  + bruit in AVF NEURO:CNI Ox3 no asterixis   . sodium chloride   Intravenous Once  . abacavir  600 mg Oral Daily  . atorvastatin  40 mg Oral q1800  . calcium acetate  1,334 mg Oral TID WC  . [START ON 07/08/2014] darbepoetin (ARANESP) injection - DIALYSIS  100 mcg Intravenous Q Wed-HD  . dolutegravir  50 mg Oral Daily  . kidney failure book   Does not apply Once  . lamiVUDine  50 mg Oral Daily  . multivitamin  1 tablet Oral QHS  . pantoprazole  40 mg Oral BID  . sodium chloride  3 mL Intravenous Q12H  . vancomycin  1,000 mg Intravenous Q M,W,F-HD   No results found. BMET    Component Value Date/Time   NA 139 07/06/2014 0700   K 3.4* 07/06/2014 0700   CL 106 07/06/2014 0700   CO2 27 07/06/2014 0700   GLUCOSE 92 07/06/2014 0700   BUN 32* 07/06/2014 0700   CREATININE 5.33* 07/06/2014 0700   CREATININE 5.29* 02/10/2014 1030   CREATININE 1.37* 11/11/2013 1108   CALCIUM 7.0* 07/06/2014 0700   GFRNONAA 11* 07/06/2014 0700   GFRNONAA 80 07/10/2013 1529   GFRAA 13* 07/06/2014 0700   GFRAA >89 07/10/2013 1529   CBC    Component Value Date/Time   WBC 5.6 07/07/2014 0745   RBC 2.56* 07/07/2014 0745   RBC 3.18* 07/06/2014 1414   HGB 7.6* 07/07/2014 0745   HCT 23.3* 07/07/2014 0745   PLT 83* 07/07/2014 0745   MCV 91.0 07/07/2014 0745   MCH 29.7 07/07/2014 0745   MCHC 32.6 07/07/2014 0745   RDW 15.7* 07/07/2014 0745   LYMPHSABS 1.2 06/22/2014 1700   MONOABS 0.1 06/22/2014 1700   EOSABS  0.0 06/22/2014 1700   BASOSABS 0.0 06/22/2014 1700     Assessment: 1. New ESRD 2. HIV 3. DVT 4 Anemia on aranesp 5. Sec HPTH  PTH 95 not requiring Vit D at this time Plan: 1. Spoke with resident and plan is for pt to be DC'd today.  He has a spot at [facility] MWF 2nd shift.  Hg are done weekly at the outpt center so will FU his Hg there.  Dymir Neeson T

## 2014-07-07 NOTE — Progress Notes (Signed)
Family Medicine Teaching Service Daily Progress Note Intern Pager: [phone]  Patient name: Kenneth [name] Medical record number: [id number] Date of birth: [birth year] Age: 59 y.o. Gender: male  Primary Care Provider: Cordelia Poche, MD Consultants: ID, nephrology, VVS, GI Code Status: Full  Pt Overview and Major Events to Date:  3/21: Admitted for lab abnormalities and concerning physical exam 3/23: Patient agreeable to HD 3/24: Barium swallow completed 3/25: INR now in therapeutic range; N/V 3/26: EGD demonstrates achalasia vs severe dysmotility 3/28: Dialysis catheter and AV fistula by vascular 3/29: First round of HD today; started bleeding from RIJ and L. Arm fistula site 3/30: reoccurrence of N/V 3/31: Botox injection of LES by GI;  4/1: HD,  2u PRBC for hgb 5.8, Tmax 101.1 4/3: Afebrile x 48 hours, Hgb 9.5, plan for HD 4/4 4/4: HD, pt did not go home due to transportation.   Assessment and Plan: 59 y.o. male presenting with cough and abdominal distension in clinic, found to have supratherapeutic INR to ~8 . PMH is significant for abdominal pain, anemia, DVT, CKD II, ED, HTN, hemorrhoids, HIV, HLD, lupus, PVCs, protein calorie malnutrition.  # ESRD (new dx), on HD, s/p permanent acess 06/29/14, Most recent creatinine prior to admission 2.77 and GFR 28 03/20/14. However on admission Cr 9.56 and BUN 64. 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 Now receiving HD (started 3/29). S/p 1st stage BVT AVF on 3/28. - Renal US shows right surgical nephrectomy and left kidney with medical renal disease.  - Followed by Nephrology ([facility]) - CLIPPED to go to [facility] MWF - If patient is unable to be discharged today due to anemia, will require HD on 4/6 prior to discharge. - Strict I/Os - First A-V fistula/graft procedure 3/29 was succesful without complications - stage 2 after basilic vein dilates with VVS (has appt scheduled), RIJ catheter placed for HD while  fistula matures  # LUE Edema, s/p Left Basilic Vein Stage 1 Fistula, Likely secondary to recent surgery with LUE fistula for HD access. Initial concern for possible cellulitis or site infection, however less likely as site looks improved today (decreased erythema and warmth) without any limitation in movement. Edema extending to fingers, likely related to hematoma formation over L-wrist vein exploration site. Unlikely DVT, given otherwise asymptomatic and no pain. - Continue to monitor.   # Fever - Improved: Recent spike in temp on 4/1 to Tmax of 101.1, since remained afebrile. Initially concern as pt was s/p fistula placement, however site appears clear without evidence of infection, erythema, or warmth, and no extension outside of marked area. Blood cultures are negative x 4 days.  Possible drug fever due to vancomycin.  - No anti-pyretics in past 3 days - Vancomycin remains in system given ESRD (no dose today 4/3), if BCx negative on 4/4, will discontinue Vanc dose - BCx (4/1, x2) - NGTD pending - UA neg, Ur Cx neg,  CXR negative - f/u fever curve and vital signs   # Anemia: Patient reports previously having blood transfusion about a month ago for anemia. He denies bleeding and notes they did not find a source for low Hbg. Denies any recent colonoscopy. Renal insufficiency likely playing a factor and also anemia of chronic disease. FH of colon cancer. Hbg baseline appears to be 8-10. FOBT negative. - s/p 2u PRBC transfused w/ HD on 4/1 (Hgb 5.8 > 10.0) - Hemoglobin 7.6 today (down from 9.5 yesterday) - Will attempt to see if pt can have  CBC checked and/or possible transfusion at dialysis; if not, pt to stay here for repeat CBC in evening.  - Currently no source of blood loss (hematoma on left arm appears stable) - ESA and Aranesp 100 QTuesday - Consider transfusing if Hbg <7 and/or patient symptomatic.   # Nausea/Vomiting, secondary to achalasia - Improved  Recurrent issue for patient. CT  with chronic esophageal dilation with fluid and debris within. EGD demonstrated retained food, suggestive of achalasia vs severe dysmotility - s/p Botox injection of LES on 3/31 - Tolerated dysphagia 3 diet well, continue on discharge - GI signed off 4/3   # Thrombocytopenia: Plts dropped from 287->104->78>63>57>62>82. All prior labs plts >200.  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 He does have a  hematoma on left arm. - HIT panel slightly elevated at 0.664 with negative serotonin assay. - Continue to monitor for ongoing bleeding - Holding heparin and pharmacologic anticoagulation - Low threshold for platelet transfusion if continues to bleed  FEN/GI: KVO dysphagia 3 diet; PPI BID Prophylaxis: SCDs  Disposition: Pending hemoglobins.  Subjective:  Feels well today. Eager to go home today if possible.   Objective: Temp:  [98.4 F (36.9 C)-99.2 F (37.3 C)] 98.8 F (37.1 C) (04/05 0520) Pulse Rate:  [71-83] 71 (04/05 0520) Resp:  [16-18] 16 (04/05 0520) BP: (123-138)/(81-94) 134/81 mmHg (04/05 0520) SpO2:  [97 %-100 %] 97 % (04/05 0520) Weight:  [167 lb 5.3 oz (75.9 kg)-168 lb 9.6 oz (76.476 kg)] 168 lb 9.6 oz (76.476 kg) (04/05 0517) Physical Exam: Gen: Comfortable sleeping in bed in NAD, pleasant. HEENT: Conjunctivae non-injected. MMM, oropharynx clear. No LAD.  Cardiovascular: RRR, no m/r/g. 2+ peripheral pulses bilaterally.  Respiratory: CTAB, no wheezing, crackles, or rhonchi. Non-labored. Abdomen: +BS, soft, nontender, non-distended Extremities: 1+ pitting edema on the R>L. No asymmetry or calf tenderness, stable persistent LUE non-pitting edema mid arm to fingers, fistula in place left antecubital fossa (bruit heard and palpable thrill), left forearm with bruising and hematoma Skin: Left arm without significant erythema or warmth over left antecubital fossa, no extending erythema within marked area. No  other rashes. Neuro: Awake, alert, grossly non-focal, limited movement in LUE ROM due to swelling, b/l grip intact, distal sensation intact  Intake/Output Summary (Last 24 hours) at 07/07/14 1039 Last data filed at 07/06/14 2200  Gross per 24 hour  Intake    640 ml  Output   3200 ml  Net  -2560 ml   Laboratory: Results for orders placed or performed during the hospital encounter of 06/22/14 (from the past 24 hour(s))  CBC     Status: Abnormal   Collection Time: 07/06/14  2:14 PM  Result Value Ref Range   WBC 7.6 4.0 - 10.5 K/uL   RBC 3.18 (L) 4.22 - 5.81 MIL/uL   Hemoglobin 9.3 (L) 13.0 - 17.0 g/dL   HCT 29.1 (L) 39.0 - 52.0 %   MCV 91.5 78.0 - 100.0 fL   MCH 29.2 26.0 - 34.0 pg   MCHC 32.0 30.0 - 36.0 g/dL   RDW 16.0 (H) 11.5 - 15.5 %   Platelets 73 (L) 150 - 400 K/uL  Reticulocytes     Status: Abnormal   Collection Time: 07/06/14  2:14 PM  Result Value Ref Range   Retic Ct Pct 2.0 0.4 - 3.1 %   RBC. 3.18 (L) 4.22 - 5.81 MIL/uL   Retic Count, Manual 63.6 19.0 - 186.0  K/uL  Haptoglobin     Status: Abnormal   Collection Time: 07/06/14  2:14 PM  Result Value Ref Range   Haptoglobin 261 (H) 34 - 200 mg/dL  Protime-INR     Status: Abnormal   Collection Time: 07/07/14  7:45 AM  Result Value Ref Range   Prothrombin Time 15.8 (H) 11.6 - 15.2 seconds   INR 1.25 0.00 - 1.49  CBC     Status: Abnormal   Collection Time: 07/07/14  7:45 AM  Result Value Ref Range   WBC 5.6 4.0 - 10.5 K/uL   RBC 2.56 (L) 4.22 - 5.81 MIL/uL   Hemoglobin 7.6 (L) 13.0 - 17.0 g/dL   HCT 23.3 (L) 39.0 - 52.0 %   MCV 91.0 78.0 - 100.0 fL   MCH 29.7 26.0 - 34.0 pg   MCHC 32.6 30.0 - 36.0 g/dL   RDW 15.7 (H) 11.5 - 15.5 %   Platelets 83 (L) 150 - 400 K/uL    PT - 17.4 INR 1.41  4/1 Blood culture x 2 - NGTD x 4 day (pending)   4/2 UA - spec grav 1.025, protein >300, neg nitrite, neg leuks, large hgb, WBC 7-10, RBC 21-50, rare squam/bact  Imaging/Diagnostic Tests: Ct Chest and Abdomen Pelvis Wo  Contrast 3/21:  CT CHEST IMPRESSION  1. Moderate degradation, secondary to lack of contrast, paucity of fat, and extent of anasarca. 2. Small bilateral pleural effusions with adjacent subsegmental atelectasis. 3. New pericardial effusion, small. 4. Chronic esophageal dilatation with fluid and debris within. This suggests a component of obstruction at the gastroesophageal junction and/or esophageal dysmotility.   CT ABDOMEN AND PELVIS IMPRESSION  1. Development of moderate abdominal pelvic ascites. 2. Apparent irregular hepatic capsule which could be CT artifactual. Interval development of cirrhosis since 03/16/2014 is felt unlikely. Correlate with risk factors. 3. Cholelithiasis. 4. Anasarca.    Renal ultrasound 3/22: 1. Right nephrectomy and chronic left medical renal disease. 2. No hydronephrosis. 3. Known ascites.  Barium swallow 3/24: 1. Stable exam since 2007. 2. Esophageal dysmotility and dilation, but no akinesia typical of achalasia. Non-fixed narrowing at the GE junction, at least partly related to remote anti reflux surgery.  EGD 3/26: Retained food in esophagus, relieved endoscopically, suspect achalasia or severe motility disorder  4/1 CXR IMPRESSION: 1. Small right and small to moderate left pleural effusions, similar to prior. 2. Stable to minimally improved left lower lobe consolidation.  Archie Patten, MD 07/07/2014, 10:39 AM PGY-1, [place] Intern pager: [phone], text pages welcome

## 2014-07-07 NOTE — Progress Notes (Signed)
Patient discharge teaching given, including activity, diet, follow-up appoints, and medications. Patient verbalized understanding of all discharge instructions. IV access was d/c'd. Vitals are stable. Skin is intact except as charted in most recent assessments. Pt to be escorted out by NT, to drive self home.

## 2014-07-07 NOTE — Progress Notes (Signed)
Occupational Therapy Treatment Patient Details Name: FRAN SCHULENBERG MRN: [id number] DOB: 10-Mar-1956 Today's Date: 07/07/2014    History of present illness pt is a 59 y/o male admitted for abdominal distension, but on admission noted to have supratherapeutic INR over 8.  Work up continues. Pt with new AV fistula LUE this admission as well as vein exploration at left wrist.   OT comments  Pt performed exercises (instructed pt to do them at least 5 times per day). Re-wrapped ace bandage to make it tighter.   Follow Up Recommendations  Home health OT    Equipment Recommendations  None recommended by OT    Recommendations for Other Services      Precautions / Restrictions Precautions Precautions: Fall Precaution Comments: HIV Restrictions Weight Bearing Restrictions: No       Mobility Bed Mobility Overal bed mobility: Modified Independent                Transfers                 General transfer comment: not assessed    Balance                                   ADL                                         General ADL Comments: educated on UB dressing technique.      Vision                     Perception     Praxis      Cognition  Awake/Alert Behavior During Therapy: WFL for tasks assessed/performed Overall Cognitive Status: Within Functional Limits for tasks assessed                       Extremity/Trunk Assessment               Exercises Other Exercises Other Exercises: Performed approximately 20 reps each of left shoulder flexion, elbow flexion/extension, wrist flexion/extension, forearm supination/pronation, digit composite flexion/extension and also used squeeze ball in hand.  Other Exercises: Reviewed to elevate LUE and explained to try to elevate above heart and to be doing exercises. Educated and demonstrated retrograde massage on pt.    Shoulder Instructions       General  Comments      Pertinent Vitals/ Pain       Pain Assessment: Faces Faces Pain Scale: Hurts little more Pain Location: left arm Pain Intervention(s): Repositioned;Monitored during session  Home Living                                          Prior Functioning/Environment              Frequency Min 2X/week     Progress Toward Goals  OT Goals(current goals can now be found in the care plan section)  Progress towards OT goals: Progressing toward goals  Acute Rehab OT Goals Patient Stated Goal: not stated OT Goal Formulation: With patient Time For Goal Achievement: 07/13/14 Potential to Achieve Goals: Good ADL Goals Pt Will Perform Grooming: with modified independence;standing Pt Will Perform Lower Body Dressing: with  modified independence;sit to/from stand Pt Will Transfer to Toilet: with modified independence;ambulating;regular height toilet;grab bars Pt Will Perform Toileting - Clothing Manipulation and hygiene: with modified independence;sit to/from stand Pt/caregiver will Perform Home Exercise Program: Increased ROM;Left upper extremity;With written HEP provided (decrease edema)  Plan Discharge plan remains appropriate    Co-evaluation                 End of Session Equipment Utilized During Treatment: Other (comment) (squeeze ball)   Activity Tolerance Patient tolerated treatment well   Patient Left in bed;with call bell/phone within reach   Nurse Communication          Time: [id number] OT Time Calculation (min): 15 min  Charges: OT General Charges $OT Visit: 1 Procedure OT Treatments $Therapeutic Exercise: 8-22 mins  Benito Mccreedy OTR/L [id number] 07/07/2014, 11:05 AM

## 2014-07-07 NOTE — Progress Notes (Signed)
Physical Therapy Treatment Patient Details Name: Kenneth [name] MRN: [id number] DOB: 1956-03-04 Today's Date: 07/07/2014    History of Present Illness pt is a 59 y/o male admitted for abdominal distension, but on admission noted to have supratherapeutic INR over 8.  Work up continues. Pt with new AV fistula LUE this admission as well as vein exploration at left wrist.    PT Comments    Progressing well.  Much more fluid of movement.  Follow Up Recommendations  Home health PT     Equipment Recommendations  None recommended by PT    Recommendations for Other Services       Precautions / Restrictions Precautions Precautions: Fall Precaution Comments: HIV Restrictions Weight Bearing Restrictions: No    Mobility  Bed Mobility Overal bed mobility: Modified Independent                Transfers Overall transfer level: Independent               General transfer comment: not assessed  Ambulation/Gait Ambulation/Gait assistance: Independent Ambulation Distance (Feet): 500 Feet Assistive device: None   Gait velocity: prefers slower, but can increase speed to age appropriate levels   General Gait Details: steady and more fluid.   Stairs Stairs: Yes Stairs assistance: Supervision Stair Management: One rail Right;Alternating pattern;Forwards Number of Stairs: 4 General stair comments: Still mild struggle going up and pt reported not confident going down either.  Wheelchair Mobility    Modified Rankin (Stroke Patients Only)       Balance Overall balance assessment: Needs assistance Sitting-balance support: No upper extremity supported Sitting balance-Leahy Scale: Good       Standing balance-Leahy Scale: Fair               High level balance activites: Direction changes;Turns;Sudden stops;Head turns (stepping over obstacles, max speed walking) High Level Balance Comments: No LOB, generally steady with fluid transitions between tasks.     Cognition Arousal/Alertness: Awake/alert Behavior During Therapy: WFL for tasks assessed/performed Overall Cognitive Status: Within Functional Limits for tasks assessed                      Exercises Other Exercises Other Exercises: Performed approximately 20 reps each of left shoulder flexion, elbow flexion/extension, wrist flexion/extension, forearm supination/pronation, digit composite flexion/extension and also used squeeze ball in hand.  Other Exercises: Reviewed to elevate LUE and explained to try to elevate above heart and to be doing exercises. Educated and demonstrated retrograde massage on pt.  Other Exercises: Worked to extend L elbow.    General Comments        Pertinent Vitals/Pain Pain Assessment: Faces Faces Pain Scale: Hurts a little bit Pain Location: Left arm Pain Descriptors / Indicators: Sore Pain Intervention(s): Monitored during session    Home Living                      Prior Function            PT Goals (current goals can now be found in the care plan section) Acute Rehab PT Goals Patient Stated Goal: not stated PT Goal Formulation: With patient Time For Goal Achievement: 07/09/14 Potential to Achieve Goals: Good Progress towards PT goals: Progressing toward goals    Frequency  Min 3X/week    PT Plan Current plan remains appropriate    Co-evaluation             End of Session   Activity Tolerance: Patient  tolerated treatment well Patient left: in bed;with call bell/phone within reach     Time: 1329-1347 PT Time Calculation (min) (ACUTE ONLY): 18 min  Charges:                       G Codes:      Petros Ahart, Tessie Fass 07/07/2014, 2:47 PM 07/07/2014  Donnella Sham, PT [phone]  (pager)

## 2014-07-09 LAB — CULTURE, BLOOD (ROUTINE X 2)
Culture: NO GROWTH
Culture: NO GROWTH

## 2014-07-14 ENCOUNTER — Encounter: Payer: Self-pay | Admitting: Obstetrics and Gynecology

## 2014-07-14 ENCOUNTER — Ambulatory Visit (INDEPENDENT_AMBULATORY_CARE_PROVIDER_SITE_OTHER): Payer: BC Managed Care – PPO | Admitting: Obstetrics and Gynecology

## 2014-07-14 VITALS — BP 150/88 | HR 103 | Temp 98.6°F | Ht 73.0 in | Wt 168.0 lb

## 2014-07-14 DIAGNOSIS — I10 Essential (primary) hypertension: Secondary | ICD-10-CM

## 2014-07-14 DIAGNOSIS — Z09 Encounter for follow-up examination after completed treatment for conditions other than malignant neoplasm: Secondary | ICD-10-CM | POA: Diagnosis not present

## 2014-07-14 DIAGNOSIS — N189 Chronic kidney disease, unspecified: Secondary | ICD-10-CM | POA: Diagnosis not present

## 2014-07-14 DIAGNOSIS — N186 End stage renal disease: Secondary | ICD-10-CM

## 2014-07-14 DIAGNOSIS — R1310 Dysphagia, unspecified: Secondary | ICD-10-CM | POA: Diagnosis not present

## 2014-07-14 DIAGNOSIS — D696 Thrombocytopenia, unspecified: Secondary | ICD-10-CM

## 2014-07-14 DIAGNOSIS — D631 Anemia in chronic kidney disease: Secondary | ICD-10-CM

## 2014-07-14 LAB — CBC
HEMATOCRIT: 26.3 % — AB (ref 39.0–52.0)
Hemoglobin: 8.6 g/dL — ABNORMAL LOW (ref 13.0–17.0)
MCH: 29.4 pg (ref 26.0–34.0)
MCHC: 32.7 g/dL (ref 30.0–36.0)
MCV: 89.8 fL (ref 78.0–100.0)
MPV: 9.7 fL (ref 8.6–12.4)
PLATELETS: 128 10*3/uL — AB (ref 150–400)
RBC: 2.93 MIL/uL — ABNORMAL LOW (ref 4.22–5.81)
RDW: 16.6 % — ABNORMAL HIGH (ref 11.5–15.5)
WBC: 7.4 10*3/uL (ref 4.0–10.5)

## 2014-07-14 MED ORDER — AMLODIPINE BESYLATE 2.5 MG PO TABS: 2.5000 mg | ORAL_TABLET | Freq: Every day | ORAL | Status: DC

## 2014-07-14 NOTE — Assessment & Plan Note (Signed)
Blood pressure today is elevated. Amlodipine was discontinued at hospital discharge. Will restart amlodipine at low dose today. Patient advised to monitor blood pressures on medication and at HD.

## 2014-07-14 NOTE — Assessment & Plan Note (Signed)
Patient with Hbg of 7.6 at time of discharge from hospital. Rechecking CBC today. Anemia due to chronic kidney disease. Denies bleeding. Patient receiving supplementation.

## 2014-07-14 NOTE — Patient Instructions (Addendum)
Mr. plott it was great to see you today!  I am pleased to hear that things are going well for you.  Here are some of the things we discussed today: -Rechecking blood work today - will call about results -I am looking into the nephrology office to see if they will be following up with you -please call your vascular surgeon's office to see when your next surgery is.  -Monitor BPs daily so I can get a baseline of where your blood pressure is   New medications: I restarted your BP medication at a lower dose  Please schedule a follow-up appointment for 2 months.   Thanks for allowing me to be a part of your care! Dr. Gerarda Fraction  Adjustment Disorder Most changes in life can cause stress. Getting used to changes may take a few months or longer. If feelings of stress, hopelessness, or worry continue, you may have an adjustment disorder. This stress-related mental health problem may affect your feelings, thinking and how you act. It occurs in both sexes and happens at any age. SYMPTOMS  Some of the following problems may be seen and vary from person to person:  Sadness or depression.  Loss of enjoyment.  Thoughts of suicide.  Fighting.  Avoiding family and friends.  Poor school performance.  Hopelessness, sense of loss.  Trouble sleeping.  Vandalism.  Worry, weight loss or gain.  Crying spells.  Anxiety  Reckless driving.  Skipping school.  Poor work Systems analyst.  Nervousness.  Ignoring bills.  Poor attitude. DIAGNOSIS  Your caregiver will ask what has happened in your life and do a physical exam. They will make a diagnosis of an adjustment disorder when they are sure another problem or medical illness causing your feelings does not exist. TREATMENT  When problems caused by stress interfere with you daily life or last longer than a few months, you may need counseling for an adjustment disorder. Early treatment may diminish problems and help you to better cope with  the stressful events in your life. Sometimes medication is necessary. Individual counseling and or support groups can be very helpful. PROGNOSIS  Adjustment disorders usually last less than 3 to 6 months. The condition may persist if there is long lasting stress. This could include health problems, relationship problems, or job difficulties where you can not easily escape from what is causing the problem. PREVENTION  Even the most mentally healthy, highly functioning people can suffer from an adjustment disorder given a significant blow from a life-changing event. There is no way to prevent pain and loss. Most people need help from time to time. You are not alone. SEEK MEDICAL CARE IF:  Your feelings or symptoms listed above do not improve or worsen. Document Released: 11/22/2005 Document Revised: 06/12/2011 Document Reviewed: 02/13/2007 [facility] Patient Information 2015 [place], [place]. This information is not intended to replace advice given to you by your health care provider. Make sure you discuss any questions you have with your health care provider.

## 2014-07-14 NOTE — Assessment & Plan Note (Signed)
Rechecking CBC today. Patient denies any bleeding or easy bruising.

## 2014-07-14 NOTE — Progress Notes (Signed)
    Subjective: Chief Complaint  Patient presents with  . Coagulation Disorder    HPI: Kenneth [name] is a 59 y.o. presenting to clinic today to discuss the following:  #Swallowing: -Patient states that he has improved swallowing status post Botox injection -Denies anymore nausea or vomiting -appetite is fair  #Anemia/thrombocytopenia: -At discharge patient had hematoma and decreased platelets Patient denies any bleeding States that hematoma has improved  #HD: -Patient started with hemodialysis prior to discharge -Received hemodialysis Monday Wednesdays and Fridays She denies any excessive fatigue after HD; says treatments are going well Patient follow-up with vascular and may -Has not followed up with nephrology since discharge  #Mood:  -Patient states that he still is adjusting to medical changes -At discharge she was most concerned about his abilities to work Take that he has not talked his boss and trying to figure out a work schedule around his hemodialysis  ROS reviewed and were negative unless otherwise noted in HPI.   Objective: BP 150/88 mmHg  Pulse 103  Temp(Src) 98.6 F (37 C) (Oral)  Ht 6\' 1"  (1.854 m)  Wt 168 lb (76.204 kg)  BMI 22.17 kg/m2  Physical Exam Gen: Tired-appearing, NAD, pleasant. HEENT: Conjunctivae non-injected. MMM, oropharynx clear. EOM nl.   Cardiovascular: RRR, no m/r/g. 2+ peripheral pulses bilaterally.  Respiratory: CTAB, no wheezing, crackles, or rhonchi. Non-labored. Extremities: 1+ pitting edema on BLE. No asymmetry or calf tenderness, stable persistent LUE non-pitting edema mid arm to fingers, fistula in place left antecubital fossa (bruit heard and palpable thrill), left forearm with bruising.  Skin: Left arm without significant erythema or warmth. Resolution of prior hematoma.  Neuro: Awake, alert, grossly non-focal, limited movement in LUE ROM due to swelling, b/l grip intact, distal sensation  intact.  Assessment/Plan: Please see problem based Assessment and Plan   Orders Placed This Encounter  Procedures  . CBC    Meds ordered this encounter  Medications  . amLODipine (NORVASC) 2.5 MG tablet    Sig: Take 1 tablet (2.5 mg total) by mouth daily.    Dispense:  30 tablet    Refill:  0     Luiz Blare, DO 07/14/2014, 2:57 PM PGY-1, [place]

## 2014-07-14 NOTE — Assessment & Plan Note (Addendum)
Tolerating HD well. Receives dialysis Monday Wednesdays and Fridays at [facility]. Will look into and set up appointment for patient for nephrology follow-up after hospitalization.

## 2014-07-14 NOTE — Assessment & Plan Note (Addendum)
No longer endorsing symptoms of dysphagia. Diagnosed with achalasia in hospital. He is now s/p Botox injection with improvement in nausea vomiting.

## 2014-07-15 ENCOUNTER — Telehealth: Payer: Self-pay | Admitting: *Deleted

## 2014-07-15 NOTE — Telephone Encounter (Signed)
Received phone call back from [facility] @ Dr Mercy Moore (nephrologist) office.  Since pt is on dialysis, he would be followed by the nephrologist there. Kenneth [name], [name]

## 2014-07-20 ENCOUNTER — Other Ambulatory Visit: Payer: Self-pay | Admitting: *Deleted

## 2014-07-20 MED ORDER — ATORVASTATIN CALCIUM 40 MG PO TABS: 40.0000 mg | ORAL_TABLET | Freq: Every day | ORAL | Status: DC

## 2014-07-22 DIAGNOSIS — R509 Fever, unspecified: Secondary | ICD-10-CM | POA: Insufficient documentation

## 2014-08-03 ENCOUNTER — Encounter: Payer: Self-pay | Admitting: Vascular Surgery

## 2014-08-03 ENCOUNTER — Encounter (HOSPITAL_COMMUNITY): Payer: BC Managed Care – PPO

## 2014-08-03 ENCOUNTER — Encounter: Payer: BC Managed Care – PPO | Admitting: Surgery

## 2014-08-04 ENCOUNTER — Ambulatory Visit (HOSPITAL_COMMUNITY)
Admission: RE | Admit: 2014-08-04 | Discharge: 2014-08-04 | Disposition: A | Discharge status: Home / Self Care | Payer: BC Managed Care – PPO | Source: Ambulatory Visit | Attending: Vascular Surgery | Admitting: Vascular Surgery

## 2014-08-04 ENCOUNTER — Ambulatory Visit (INDEPENDENT_AMBULATORY_CARE_PROVIDER_SITE_OTHER): Payer: Self-pay | Admitting: Vascular Surgery

## 2014-08-04 ENCOUNTER — Other Ambulatory Visit: Payer: Self-pay | Admitting: Vascular Surgery

## 2014-08-04 VITALS — BP 173/94 | HR 79 | Resp 16 | Ht 73.0 in | Wt 160.0 lb

## 2014-08-04 DIAGNOSIS — Z4931 Encounter for adequacy testing for hemodialysis: Secondary | ICD-10-CM | POA: Insufficient documentation

## 2014-08-04 DIAGNOSIS — N186 End stage renal disease: Secondary | ICD-10-CM | POA: Insufficient documentation

## 2014-08-04 NOTE — Progress Notes (Signed)
Filed Vitals:   08/04/14 1311 08/04/14 1312  BP: 178/97 173/94  Pulse: 86 79  Resp: 16   Height: 6\' 1"  (1.854 m)   Weight: 160 lb (72.576 kg)

## 2014-08-04 NOTE — Progress Notes (Signed)
Subjective:     Patient ID: Kenneth [name], male   DOB: Feb 08, 1956, 59 y.o.   MRN: [id number]  HPI this 59 year old male with end-stage renal disease had first stage basilic vein transposition performed by Dr. early 06/29/2014. Patient has had some edema in the left upper extremity and was referred for evaluation of this. He denies pain or numbness in the left hand. He is being dialyzed through a tunneled catheter in the right IJ.  Past Medical History  Diagnosis Date  . HIV infection dx ~ 2009    a. 02/13/2014 CD4 = 240 - undetectable viral load.  . Hyperlipidemia   . Hypertension   . Insomnia   . Dysphagia 2008    post heller myotomy/toupee fundoplication.    . Achalasia   . Renal abscess   . Hemorrhoids, internal   . Premature ventricular contractions   . Pancytopenia   . CKD (chronic kidney disease), stage II   . Lupus nephritis   . SLE (systemic lupus erythematosus) dx'd 2015  . Candida infection, esophageal 08/2010    a. 2012 - noted on EGD.  Marland Kitchen DVT (deep venous thrombosis) ~ 04/2014  . History of blood transfusion 03/2014    "low counts"  . History of stomach ulcers     History  Substance Use Topics  . Smoking status: Never Smoker   . Smokeless tobacco: Never Used  . Alcohol Use: No    Family History  Problem Relation Age of Onset  . Colon cancer Father     deceased  . Other Mother     s/p pacemaker - alive and well.  . Other      5 brothers, 3 sisters - alive and well.    Allergies  Allergen Reactions  . Amoxicillin     REACTION: diffuse rash  . Heparin Other (See Comments)    HIT weakly  + 07/01/2014     Current outpatient prescriptions:  .  abacavir (ZIAGEN) 300 MG tablet, Take 2 tablets (600 mg total) by mouth daily., Disp: 30 tablet, Rfl: 1 .  acetaminophen (TYLENOL) 500 MG tablet, Take 500 mg by mouth every 6 (six) hours as needed for mild pain., Disp: , Rfl:  .  amLODipine (NORVASC) 2.5 MG tablet, Take 1 tablet (2.5 mg total) by mouth daily., Disp:  30 tablet, Rfl: 0 .  atorvastatin (LIPITOR) 40 MG tablet, Take 1 tablet (40 mg total) by mouth daily., Disp: 30 tablet, Rfl: 0 .  calcium acetate (PHOSLO) 667 MG capsule, Take 2 capsules (1,334 mg total) by mouth 3 (three) times daily with meals., Disp: 180 capsule, Rfl: 0 .  cyclobenzaprine (FLEXERIL) 5 MG tablet, Take 5 mg by mouth 3 (three) times daily as needed for muscle spasms., Disp: , Rfl:  .  dolutegravir (TIVICAY) 50 MG tablet, Take 1 tablet (50 mg total) by mouth daily., Disp: 30 tablet, Rfl: 1 .  lamiVUDine (EPIVIR) 10 MG/ML solution, Take 5 mLs (50 mg total) by mouth daily., Disp: 240 mL, Rfl: 1 .  multivitamin (RENA-VIT) TABS tablet, Take 1 tablet by mouth at bedtime., Disp: 30 tablet, Rfl: 0 .  omeprazole (PRILOSEC) 20 MG capsule, Take 1 capsule (20 mg total) by mouth daily. (Patient not taking: Reported on 06/22/2014), Disp: 30 capsule, Rfl: 2  Filed Vitals:   08/04/14 1311 08/04/14 1312  BP: 178/97 173/94  Pulse: 86 79  Resp: 16   Height: 6\' 1"  (1.854 m)   Weight: 160 lb (72.576 kg)  Body mass index is 21.11 kg/(m^2).           Review of Systems denies chest pain   dyspnea on exertion, PND, hemoptysis. Objective:   Physical Exam BP 173/94 mmHg  Pulse 79  Resp 16  Ht 6\' 1"  (1.854 m)  Wt 160 lb (72.576 kg)  BMI 21.11 kg/m2  Gen. thin male in no apparent distress alert and oriented 3 Left upper extremity with mild edema from antecubital area distally. 3+ radial pulse palpable with well-perfused left hand. Prominent superficial veins in the upper extremity communicating basilic vein to cephalic vein. Incisions well healed. Some swelling over distal forearm incision were cephalic vein was explored-not fluctuant  Today I ordered a duplex scan of the left upper arm fistula which reveals the basilic vein transposition to be widely patent. Diameter of the vein is between 0.3-1.38 cm with good inflow.     Assessment:     Status post stage I basilic vein  transposition 06/29/2014 by Dr. early with edema left forearm which is slowly resolving-no evidence of steal    Plan:     Patient to return in 2-3 weeks to be evaluated by Dr. early to determine timing of stage II basilic vein transposition

## 2014-08-11 ENCOUNTER — Other Ambulatory Visit (HOSPITAL_COMMUNITY): Payer: BC Managed Care – PPO

## 2014-08-14 ENCOUNTER — Encounter (HOSPITAL_COMMUNITY): Payer: BC Managed Care – PPO

## 2014-08-17 ENCOUNTER — Other Ambulatory Visit: Payer: Self-pay | Admitting: *Deleted

## 2014-08-17 MED ORDER — AMLODIPINE BESYLATE 2.5 MG PO TABS: 2.5000 mg | ORAL_TABLET | Freq: Every day | ORAL | Status: DC

## 2014-08-18 ENCOUNTER — Encounter: Payer: BC Managed Care – PPO | Admitting: Vascular Surgery

## 2014-08-20 ENCOUNTER — Other Ambulatory Visit: Payer: Self-pay | Admitting: *Deleted

## 2014-08-20 MED ORDER — ATORVASTATIN CALCIUM 40 MG PO TABS: 40.0000 mg | ORAL_TABLET | Freq: Every day | ORAL | Status: DC

## 2014-08-25 ENCOUNTER — Ambulatory Visit: Payer: BC Managed Care – PPO | Admitting: Vascular Surgery

## 2014-08-28 ENCOUNTER — Encounter: Payer: Self-pay | Admitting: Vascular Surgery

## 2014-09-01 ENCOUNTER — Encounter: Payer: Self-pay | Admitting: Vascular Surgery

## 2014-09-01 ENCOUNTER — Ambulatory Visit (INDEPENDENT_AMBULATORY_CARE_PROVIDER_SITE_OTHER): Payer: Self-pay | Admitting: Vascular Surgery

## 2014-09-01 VITALS — BP 165/107 | HR 111 | Ht 73.0 in | Wt 150.0 lb

## 2014-09-01 DIAGNOSIS — N186 End stage renal disease: Secondary | ICD-10-CM

## 2014-09-01 NOTE — Progress Notes (Signed)
Here today for follow-up of basilic vein transposition 04/05/2026. He has a functioning dialysis catheter has had no difficulty with this. He did undergo vein duplex on 08/04/2014 is reviewed this with him. This dos show excellent maturation of his basilic vein.   On physical exam his antecubital incision is well-healed. He did have incisions wrist looking at the cephalic vein which was inadequate for fistula creation. He does have collateralization into his upper arm cephalic vein and this is of moderate size.   I have recommended second stage basilic vein transposition. Fillet he has excellent chance this will be acceptable for long-term dialysis. We'll schedule this at his earliest convenience

## 2014-09-02 ENCOUNTER — Other Ambulatory Visit: Payer: Self-pay

## 2014-09-10 ENCOUNTER — Encounter (HOSPITAL_COMMUNITY): Payer: Self-pay | Admitting: *Deleted

## 2014-09-10 MED ORDER — CHLORHEXIDINE GLUCONATE CLOTH 2 % EX PADS: 6.0000 | MEDICATED_PAD | Freq: Once | CUTANEOUS | Status: DC

## 2014-09-10 MED ORDER — SODIUM CHLORIDE 0.9 % IV SOLN
INTRAVENOUS | Status: DC
  Administered 2014-09-11 (×2): via INTRAVENOUS

## 2014-09-10 MED ORDER — VANCOMYCIN HCL IN DEXTROSE 1-5 GM/200ML-% IV SOLN
1000.0000 mg | INTRAVENOUS | Status: AC
  Administered 2014-09-11: 1000 mg via INTRAVENOUS
  Filled 2014-09-10: qty 200

## 2014-09-11 ENCOUNTER — Ambulatory Visit (HOSPITAL_COMMUNITY): Payer: BC Managed Care – PPO | Admitting: Anesthesiology

## 2014-09-11 ENCOUNTER — Encounter (HOSPITAL_COMMUNITY)
Admission: RE | Disposition: A | Discharge status: Home / Self Care | Payer: Self-pay | Source: Ambulatory Visit | Attending: Vascular Surgery

## 2014-09-11 ENCOUNTER — Encounter (HOSPITAL_COMMUNITY): Payer: Self-pay | Admitting: *Deleted

## 2014-09-11 ENCOUNTER — Telehealth: Payer: Self-pay | Admitting: Vascular Surgery

## 2014-09-11 ENCOUNTER — Ambulatory Visit (HOSPITAL_COMMUNITY)
Admission: RE | Admit: 2014-09-11 | Discharge: 2014-09-11 | Disposition: A | Discharge status: Home / Self Care | Payer: BC Managed Care – PPO | Source: Ambulatory Visit | Attending: Vascular Surgery | Admitting: Vascular Surgery

## 2014-09-11 ENCOUNTER — Other Ambulatory Visit: Payer: Self-pay | Admitting: *Deleted

## 2014-09-11 DIAGNOSIS — Z79899 Other long term (current) drug therapy: Secondary | ICD-10-CM | POA: Insufficient documentation

## 2014-09-11 DIAGNOSIS — N186 End stage renal disease: Secondary | ICD-10-CM | POA: Diagnosis present

## 2014-09-11 DIAGNOSIS — I120 Hypertensive chronic kidney disease with stage 5 chronic kidney disease or end stage renal disease: Secondary | ICD-10-CM | POA: Insufficient documentation

## 2014-09-11 DIAGNOSIS — Z4931 Encounter for adequacy testing for hemodialysis: Secondary | ICD-10-CM

## 2014-09-11 DIAGNOSIS — N184 Chronic kidney disease, stage 4 (severe): Secondary | ICD-10-CM

## 2014-09-11 DIAGNOSIS — I509 Heart failure, unspecified: Secondary | ICD-10-CM | POA: Insufficient documentation

## 2014-09-11 DIAGNOSIS — I252 Old myocardial infarction: Secondary | ICD-10-CM | POA: Insufficient documentation

## 2014-09-11 HISTORY — PX: BASCILIC VEIN TRANSPOSITION: SHX5742

## 2014-09-11 LAB — POCT I-STAT 4, (NA,K, GLUC, HGB,HCT)
Glucose, Bld: 93 mg/dL (ref 65–99)
HEMATOCRIT: 25 % — AB (ref 39.0–52.0)
Hemoglobin: 8.5 g/dL — ABNORMAL LOW (ref 13.0–17.0)
POTASSIUM: 5 mmol/L (ref 3.5–5.1)
Sodium: 139 mmol/L (ref 135–145)

## 2014-09-11 SURGERY — TRANSPOSITION, VEIN, BASILIC
Anesthesia: General | Site: Arm Upper | Laterality: Left

## 2014-09-11 MED ORDER — SODIUM CHLORIDE 0.9 % IV SOLN
10.0000 mg | INTRAVENOUS | Status: DC | PRN
  Administered 2014-09-11: 25 ug/min via INTRAVENOUS

## 2014-09-11 MED ORDER — FENTANYL CITRATE (PF) 100 MCG/2ML IJ SOLN
INTRAMUSCULAR | Status: DC | PRN
Start: 2014-09-11 — End: 2014-09-11
  Administered 2014-09-11 (×2): 25 ug via INTRAVENOUS
  Administered 2014-09-11 (×2): 50 ug via INTRAVENOUS

## 2014-09-11 MED ORDER — ONDANSETRON HCL 4 MG/2ML IJ SOLN
INTRAMUSCULAR | Status: DC | PRN
  Administered 2014-09-11: 4 mg via INTRAVENOUS

## 2014-09-11 MED ORDER — ACETAMINOPHEN 160 MG/5ML PO SOLN
325.0000 mg | ORAL | Status: DC | PRN
  Filled 2014-09-11: qty 20.3

## 2014-09-11 MED ORDER — PROPOFOL 10 MG/ML IV BOLUS
INTRAVENOUS | Status: AC
  Filled 2014-09-11: qty 20

## 2014-09-11 MED ORDER — LIDOCAINE HCL (CARDIAC) 20 MG/ML IV SOLN
INTRAVENOUS | Status: DC | PRN
  Administered 2014-09-11: 50 mg via INTRAVENOUS

## 2014-09-11 MED ORDER — OXYCODONE HCL 5 MG PO TABS
ORAL_TABLET | ORAL | Status: AC
  Filled 2014-09-11: qty 1

## 2014-09-11 MED ORDER — PROPOFOL 10 MG/ML IV BOLUS
INTRAVENOUS | Status: DC | PRN
  Administered 2014-09-11: 200 mg via INTRAVENOUS

## 2014-09-11 MED ORDER — MIDAZOLAM HCL 5 MG/5ML IJ SOLN
INTRAMUSCULAR | Status: DC | PRN
  Administered 2014-09-11: 2 mg via INTRAVENOUS

## 2014-09-11 MED ORDER — PHENYLEPHRINE HCL 10 MG/ML IJ SOLN
INTRAMUSCULAR | Status: DC | PRN
  Administered 2014-09-11: 120 ug via INTRAVENOUS
  Administered 2014-09-11: 80 ug via INTRAVENOUS

## 2014-09-11 MED ORDER — ACETAMINOPHEN 325 MG PO TABS: 325.0000 mg | ORAL_TABLET | ORAL | Status: DC | PRN

## 2014-09-11 MED ORDER — FENTANYL CITRATE (PF) 100 MCG/2ML IJ SOLN: 25.0000 ug | INTRAMUSCULAR | Status: DC | PRN

## 2014-09-11 MED ORDER — OXYCODONE HCL 5 MG/5ML PO SOLN: 5.0000 mg | Freq: Once | ORAL | Status: AC | PRN

## 2014-09-11 MED ORDER — FENTANYL CITRATE (PF) 250 MCG/5ML IJ SOLN
INTRAMUSCULAR | Status: AC
  Filled 2014-09-11: qty 5

## 2014-09-11 MED ORDER — OXYCODONE HCL 5 MG PO TABS
5.0000 mg | ORAL_TABLET | Freq: Once | ORAL | Status: AC | PRN
  Administered 2014-09-11: 5 mg via ORAL

## 2014-09-11 MED ORDER — 0.9 % SODIUM CHLORIDE (POUR BTL) OPTIME
TOPICAL | Status: DC | PRN
  Administered 2014-09-11: 1000 mL

## 2014-09-11 MED ORDER — LIDOCAINE-EPINEPHRINE 0.5 %-1:200000 IJ SOLN
INTRAMUSCULAR | Status: AC
  Filled 2014-09-11: qty 1

## 2014-09-11 MED ORDER — MIDAZOLAM HCL 2 MG/2ML IJ SOLN
INTRAMUSCULAR | Status: AC
  Filled 2014-09-11: qty 2

## 2014-09-11 MED ORDER — HEPARIN SODIUM (PORCINE) 1000 UNIT/ML IJ SOLN
INTRAMUSCULAR | Status: AC
  Filled 2014-09-11: qty 1

## 2014-09-11 MED ORDER — OXYCODONE-ACETAMINOPHEN 5-325 MG PO TABS: 1.0000 | ORAL_TABLET | Freq: Four times a day (QID) | ORAL | Status: DC | PRN

## 2014-09-11 MED ORDER — SODIUM CHLORIDE 0.9 % IR SOLN
Status: DC | PRN
  Administered 2014-09-11: 500 mL

## 2014-09-11 MED ORDER — OXYCODONE-ACETAMINOPHEN 5-325 MG PO TABS
1.0000 | ORAL_TABLET | Freq: Four times a day (QID) | ORAL | Status: DC | PRN
Start: 2014-09-11 — End: 2014-10-01

## 2014-09-11 SURGICAL SUPPLY — 45 items
BANDAGE ELASTIC 6 VELCRO ST LF (GAUZE/BANDAGES/DRESSINGS) ×2 IMPLANT
BENZOIN TINCTURE PRP APPL 2/3 (GAUZE/BANDAGES/DRESSINGS) ×2 IMPLANT
BNDG GAUZE ELAST 4 BULKY (GAUZE/BANDAGES/DRESSINGS) ×2 IMPLANT
CANISTER SUCTION 2500CC (MISCELLANEOUS) ×2 IMPLANT
CANNULA VESSEL 3MM 2 BLNT TIP (CANNULA) IMPLANT
CLIP LIGATING EXTRA MED SLVR (CLIP) ×2 IMPLANT
CLIP LIGATING EXTRA SM BLUE (MISCELLANEOUS) ×2 IMPLANT
CLSR STERI-STRIP ANTIMIC 1/2X4 (GAUZE/BANDAGES/DRESSINGS) ×2 IMPLANT
COVER PROBE W GEL 5X96 (DRAPES) ×2 IMPLANT
DECANTER SPIKE VIAL GLASS SM (MISCELLANEOUS) ×2 IMPLANT
ELECT REM PT RETURN 9FT ADLT (ELECTROSURGICAL) ×2
ELECTRODE REM PT RTRN 9FT ADLT (ELECTROSURGICAL) ×1 IMPLANT
GAUZE SPONGE 4X4 12PLY STRL (GAUZE/BANDAGES/DRESSINGS) ×2 IMPLANT
GEL ULTRASOUND 20GR AQUASONIC (MISCELLANEOUS) IMPLANT
GLOVE BIO SURGEON STRL SZ 6.5 (GLOVE) ×4 IMPLANT
GLOVE BIO SURGEON STRL SZ8.5 (GLOVE) ×4 IMPLANT
GLOVE BIOGEL PI IND STRL 6.5 (GLOVE) ×1 IMPLANT
GLOVE BIOGEL PI IND STRL 8.5 (GLOVE) ×1 IMPLANT
GLOVE BIOGEL PI INDICATOR 6.5 (GLOVE) ×1
GLOVE BIOGEL PI INDICATOR 8.5 (GLOVE) ×1
GLOVE SS BIOGEL STRL SZ 7.5 (GLOVE) ×1 IMPLANT
GLOVE SUPERSENSE BIOGEL SZ 7.5 (GLOVE) ×1
GLOVE SURG SS PI 6.5 STRL IVOR (GLOVE) ×2 IMPLANT
GOWN STRL REUS W/ TWL LRG LVL3 (GOWN DISPOSABLE) ×3 IMPLANT
GOWN STRL REUS W/ TWL XL LVL3 (GOWN DISPOSABLE) ×1 IMPLANT
GOWN STRL REUS W/TWL LRG LVL3 (GOWN DISPOSABLE) ×3
GOWN STRL REUS W/TWL XL LVL3 (GOWN DISPOSABLE) ×1
KIT BASIN OR (CUSTOM PROCEDURE TRAY) ×2 IMPLANT
KIT ROOM TURNOVER OR (KITS) ×2 IMPLANT
LIQUID BAND (GAUZE/BANDAGES/DRESSINGS) ×2 IMPLANT
NS IRRIG 1000ML POUR BTL (IV SOLUTION) ×2 IMPLANT
PACK CV ACCESS (CUSTOM PROCEDURE TRAY) ×2 IMPLANT
PAD ARMBOARD 7.5X6 YLW CONV (MISCELLANEOUS) ×4 IMPLANT
SPONGE GAUZE 4X4 12PLY STER LF (GAUZE/BANDAGES/DRESSINGS) ×2 IMPLANT
SPONGE LAP 18X18 X RAY DECT (DISPOSABLE) ×2 IMPLANT
STRIP CLOSURE SKIN 1/2X4 (GAUZE/BANDAGES/DRESSINGS) ×2 IMPLANT
SUT PROLENE 5 0 C 1 24 (SUTURE) ×4 IMPLANT
SUT PROLENE 6 0 CC (SUTURE) ×6 IMPLANT
SUT SILK 2 0 SH (SUTURE) ×2 IMPLANT
SUT SILK 3 0 (SUTURE) ×1
SUT SILK 3-0 18XBRD TIE 12 (SUTURE) ×1 IMPLANT
SUT VIC AB 3-0 SH 27 (SUTURE) ×1
SUT VIC AB 3-0 SH 27X BRD (SUTURE) ×1 IMPLANT
UNDERPAD 30X30 INCONTINENT (UNDERPADS AND DIAPERS) ×2 IMPLANT
WATER STERILE IRR 1000ML POUR (IV SOLUTION) ×2 IMPLANT

## 2014-09-11 NOTE — Anesthesia Postprocedure Evaluation (Signed)
  Anesthesia Post-op Note  Patient: Kenneth [name]  Procedure(s) Performed: Procedure(s): SECOND STAGE BASILIC VEIN TRANSPOSITION LEFT UPPER ARM (Left)  Patient Location: PACU  Anesthesia Type:General  Level of Consciousness: awake  Airway and Oxygen Therapy: Patient Spontanous Breathing  Post-op Pain: mild  Post-op Assessment: Post-op Vital signs reviewed, Patient's Cardiovascular Status Stable, Respiratory Function Stable, Patent Airway, No signs of Nausea or vomiting and Pain level controlled              Post-op Vital Signs: Reviewed and stable  Last Vitals:  Filed Vitals:   09/11/14 1128  BP:   Pulse:   Temp: 36.8 C  Resp:     Complications: No apparent anesthesia complications

## 2014-09-11 NOTE — Telephone Encounter (Signed)
-----   Message from Mena Goes, RN sent at 09/11/2014 10:54 AM EDT ----- Regarding: Schedule   ----- Message -----    From: Ulyses Amor, PA-C    Sent: 09/11/2014  10:43 AM      To: [facility] Charge Pool  Dr. Donnetta Hutching f/u in 4 weeks s/p second stage AV fistula left UE

## 2014-09-11 NOTE — H&P (View-Only) (Signed)
Here today for follow-up of basilic vein transposition 04/05/2026. He has a functioning dialysis catheter has had no difficulty with this. He did undergo vein duplex on 08/04/2014 is reviewed this with him. This dos show excellent maturation of his basilic vein.   On physical exam his antecubital incision is well-healed. He did have incisions wrist looking at the cephalic vein which was inadequate for fistula creation. He does have collateralization into his upper arm cephalic vein and this is of moderate size.   I have recommended second stage basilic vein transposition. Fillet he has excellent chance this will be acceptable for long-term dialysis. We'll schedule this at his earliest convenience

## 2014-09-11 NOTE — Telephone Encounter (Signed)
LM for pt re appt, dpm °

## 2014-09-11 NOTE — Transfer of Care (Signed)
Immediate Anesthesia Transfer of Care Note  Patient: Kenneth [name]  Procedure(s) Performed: Procedure(s): SECOND STAGE BASILIC VEIN TRANSPOSITION LEFT UPPER ARM (Left)  Patient Location: PACU  Anesthesia Type:General  Level of Consciousness: awake, alert  and oriented  Airway & Oxygen Therapy: Patient Spontanous Breathing and Patient connected to nasal cannula oxygen  Post-op Assessment: Report given to RN, Post -op Vital signs reviewed and stable and Patient moving all extremities X 4  Post vital signs: Reviewed and stable  Last Vitals:  Filed Vitals:   09/11/14 0609  BP: 177/94  Pulse: 94  Temp: 36.9 C  Resp: 18    Complications: No apparent anesthesia complications

## 2014-09-11 NOTE — Interval H&P Note (Signed)
History and Physical Interval Note:  09/11/2014 7:01 AM  Kenneth [name]  has presented today for surgery, with the diagnosis of End Stage Renal Disease N18.6  The various methods of treatment have been discussed with the patient and family. After consideration of risks, benefits and other options for treatment, the patient has consented to  Procedure(s): SECOND STAGE BASILIC VEIN TRANSPOSITION (Left) as a surgical intervention .  The patient's history has been reviewed, patient examined, no change in status, stable for surgery.  I have reviewed the patient's chart and labs.  Questions were answered to the patient's satisfaction.     Curt Jews

## 2014-09-11 NOTE — Anesthesia Procedure Notes (Signed)
Procedure Name: LMA Insertion Date/Time: 09/11/2014 7:37 AM Performed by: Neldon Newport Pre-anesthesia Checklist: Timeout performed, Patient being monitored, Suction available, Emergency Drugs available and Patient identified Patient Re-evaluated:Patient Re-evaluated prior to inductionOxygen Delivery Method: Circle system utilized Preoxygenation: Pre-oxygenation with 100% oxygen Intubation Type: IV induction Ventilation: Mask ventilation without difficulty LMA: LMA inserted LMA Size: 4.0 Placement Confirmation: breath sounds checked- equal and bilateral and positive ETCO2 Tube secured with: Tape Dental Injury: Teeth and Oropharynx as per pre-operative assessment

## 2014-09-11 NOTE — Op Note (Signed)
    OPERATIVE REPORT  DATE OF SURGERY: 09/11/2014  PATIENT: Kenneth [name], 59 y.o. male MRN: [id number]  DOB: [birth year]  PRE-OPERATIVE DIAGNOSIS: End-stage renal disease  POST-OPERATIVE DIAGNOSIS:  Same  PROCEDURE: Second stage basilic vein transposition fistula left upper arm  SURGEON:  Curt Jews, M.D.  PHYSICIAN ASSISTANT: Nurse  ANESTHESIA:  Gen.  EBL: Minimal ml  Total I/O In: 1000 [I.V.:1000] Out: 100 [Blood:100]  BLOOD ADMINISTERED: None  DRAINS: None  SPECIMEN: None  COUNTS CORRECT:  YES  PLAN OF CARE: PACU   PATIENT DISPOSITION:  PACU - hemodynamically stable  PROCEDURE DETAILS: Patient was taking preoperatively superficial. The left on February sterile fashion SonoSite ultrasound was used to visualize and mark the basilic vein. Patient had a very well-developed Silvadene fistula is now back for second stage transposition. Decision was made over the antecubital space to the prior basilic vein to brachial artery fistula. There was extensive scarring in this area. 3 different incisions made from above the antecubital space up to the axilla for harvesting of the basilic vein. Tributary branches were ligated with reinforcement thousand divided. The brachial artery was exposed proximal to the old anastomosis. The artery was occluded vein was divided. This was brought through the tunnel up to the axilla. A new tunnel was created from the level antecubital space to the axilla various are very superficial under the skin. The vein: Twisting. It was gently dilated was of excellent size. The tunnel. The vein was cut to appropriate length and was sewn end-to-side to the brachial artery with a running 60 proline suture. Anastomosis tested and found to be adequate. His excellent thrill through the vein was excellent size maturation. The wound irrigated with saline. Hemostasis tablet cautery. The wounds were closed with 3-0 Monocryl subcutaneous and subcuticular tissue. Benzoin  insertion for applied. Kerlix and Ace was applied over the arm. Patient was transferred to the recovery room stable condition   Curt Jews, M.D. 09/11/2014 10:39 AM

## 2014-09-11 NOTE — OR Nursing (Signed)
After conversation with Dr.Early concerning ? HIT in previous admission , Dr.Early stated we would use IV 0.9%  Saline instead of Heparin Saline.

## 2014-09-11 NOTE — Progress Notes (Signed)
Blue port [place] flushed with 10cc NS then 2.4cc heparin. Blunt capped and taped off.

## 2014-09-11 NOTE — Anesthesia Preprocedure Evaluation (Addendum)
Anesthesia Evaluation  Patient identified by MRN, date of birth, ID band Patient awake    Reviewed: Allergy & Precautions, H&P , NPO status , Patient's Chart, lab work & pertinent test results  History of Anesthesia Complications Negative for: history of anesthetic complications  Airway Mallampati: I  TM Distance: >3 FB Neck ROM: full    Dental  (+) Teeth Intact, Dental Advidsory Given   Pulmonary neg pulmonary ROS,  breath sounds clear to auscultation- rhonchi        Cardiovascular hypertension, On Medications and Pt. on medications - angina- Past MI and - CHF Rhythm:Regular     Neuro/Psych PSYCHIATRIC DISORDERS negative neurological ROS     GI/Hepatic negative GI ROS, Neg liver ROS,   Endo/Other  negative endocrine ROS  Renal/GU ESRF and DialysisRenal disease     Musculoskeletal   Abdominal   Peds  Hematology  (+) anemia ,   Anesthesia Other Findings   Reproductive/Obstetrics                           Anesthesia Physical Anesthesia Plan  ASA: III  Anesthesia Plan:    Post-op Pain Management:    Induction:   Airway Management Planned:   Additional Equipment:   Intra-op Plan:   Post-operative Plan:   Informed Consent:   Dental Advisory Given  Plan Discussed with: Anesthesiologist, CRNA and Surgeon  Anesthesia Plan Comments:        Anesthesia Quick Evaluation

## 2014-09-14 ENCOUNTER — Encounter (HOSPITAL_COMMUNITY): Payer: Self-pay | Admitting: Vascular Surgery

## 2014-09-15 ENCOUNTER — Telehealth: Payer: Self-pay

## 2014-09-15 NOTE — Telephone Encounter (Signed)
Phone call from pt.  Questioned when he could remove the initial surgical bandage.  Stated he wasn't given any instructions when he left the hospital.  Advised he could remove the initial bandage from left upper arm, now, since he is more than 72 hrs post-op.  Advised to gently remove the dressing, and to moisten with warm water, if the gauze is stuck to the wound.  Encouraged to cleanse surgical site with Dial soap; advised to shower or sponge bathe only; no bathtub.  Advised to keep area clean/ dry.  Encouraged to call office if any signs of infection; ie: redness, warmth, increased tenderness, drainage, fever, or chills.  Pt. Verb. Understanding.

## 2014-09-24 ENCOUNTER — Telehealth: Payer: Self-pay | Admitting: Family Medicine

## 2014-09-24 NOTE — Telephone Encounter (Signed)
Pt called and would like a refill on his Lipitor called in. jw

## 2014-09-25 ENCOUNTER — Encounter: Payer: Self-pay | Admitting: Vascular Surgery

## 2014-09-25 MED ORDER — ATORVASTATIN CALCIUM 40 MG PO TABS: 40.0000 mg | ORAL_TABLET | Freq: Every day | ORAL | Status: DC

## 2014-09-29 ENCOUNTER — Ambulatory Visit (INDEPENDENT_AMBULATORY_CARE_PROVIDER_SITE_OTHER): Payer: Self-pay | Admitting: Vascular Surgery

## 2014-09-29 ENCOUNTER — Encounter: Payer: Self-pay | Admitting: Vascular Surgery

## 2014-09-29 VITALS — BP 145/93 | HR 95 | Ht 73.0 in | Wt 146.7 lb

## 2014-09-29 DIAGNOSIS — N186 End stage renal disease: Secondary | ICD-10-CM

## 2014-09-29 NOTE — Progress Notes (Signed)
Here today for follow-up of left upper arm basilic vein transposition fistula. He is currently dialyzed via a right IJ catheter reports no difficulty with this. He had first stage basilic vein fistula creation by myself on 06/29/2014. There was some delay in him scheduling his transposition. This was done on 09/11/2014. His surgical incisions are all healing quite nicely. He has an excellent thrill in his upper arm fistula has a 2-3+ normal radial pulse of his left wrist. Has excellent maturation in size throughout the fistula which runs extremely superficially and also is in a very straight course.  He is just over 2 weeks out from immobilization. I would wait for one additional month and should be able to begin using his fistula at the end of July. I explained to him that the following successful use of the fistula, we would then the remove his catheter in short stay at the hospital. He would notify should he develop any difficulty in the interim.

## 2014-10-01 ENCOUNTER — Encounter: Payer: Self-pay | Admitting: Infectious Diseases

## 2014-10-01 ENCOUNTER — Ambulatory Visit (INDEPENDENT_AMBULATORY_CARE_PROVIDER_SITE_OTHER): Payer: BC Managed Care – PPO | Admitting: Infectious Diseases

## 2014-10-01 VITALS — BP 159/92 | HR 81 | Temp 98.3°F | Wt 147.0 lb

## 2014-10-01 DIAGNOSIS — Z79899 Other long term (current) drug therapy: Secondary | ICD-10-CM

## 2014-10-01 DIAGNOSIS — E785 Hyperlipidemia, unspecified: Secondary | ICD-10-CM

## 2014-10-01 DIAGNOSIS — I10 Essential (primary) hypertension: Secondary | ICD-10-CM

## 2014-10-01 DIAGNOSIS — B20 Human immunodeficiency virus [HIV] disease: Secondary | ICD-10-CM | POA: Diagnosis not present

## 2014-10-01 DIAGNOSIS — Z113 Encounter for screening for infections with a predominantly sexual mode of transmission: Secondary | ICD-10-CM

## 2014-10-01 DIAGNOSIS — N186 End stage renal disease: Secondary | ICD-10-CM

## 2014-10-01 LAB — LIPID PANEL
CHOL/HDL RATIO: 3.8 ratio
Cholesterol: 138 mg/dL (ref 0–200)
HDL: 36 mg/dL — ABNORMAL LOW (ref >=40)
LDL Cholesterol: 84 mg/dL (ref 0–99)
TRIGLYCERIDES: 91 mg/dL (ref <150)
VLDL: 18 mg/dL (ref 0–40)

## 2014-10-01 LAB — CBC
HCT: 25.4 % — ABNORMAL LOW (ref 39.0–52.0)
HEMOGLOBIN: 8.2 g/dL — AB (ref 13.0–17.0)
MCH: 28.4 pg (ref 26.0–34.0)
MCHC: 32.3 g/dL (ref 30.0–36.0)
MCV: 87.9 fL (ref 78.0–100.0)
MPV: 10.7 fL (ref 8.6–12.4)
Platelets: 105 10*3/uL — ABNORMAL LOW (ref 150–400)
RBC: 2.89 MIL/uL — ABNORMAL LOW (ref 4.22–5.81)
RDW: 16 % — AB (ref 11.5–15.5)
WBC: 3.7 10*3/uL — ABNORMAL LOW (ref 4.0–10.5)

## 2014-10-01 LAB — COMPREHENSIVE METABOLIC PANEL
ALBUMIN: 2.7 g/dL — AB (ref 3.5–5.2)
ALK PHOS: 62 U/L (ref 39–117)
ALT: 10 U/L (ref 0–53)
AST: 27 U/L (ref 0–37)
BILIRUBIN TOTAL: 0.3 mg/dL (ref 0.2–1.2)
BUN: 15 mg/dL (ref 6–23)
CO2: 33 meq/L — AB (ref 19–32)
CREATININE: 4.72 mg/dL — AB (ref 0.50–1.35)
Calcium: 8.5 mg/dL (ref 8.4–10.5)
Chloride: 96 mEq/L (ref 96–112)
Glucose, Bld: 87 mg/dL (ref 70–99)
Potassium: 5.5 mEq/L — ABNORMAL HIGH (ref 3.5–5.3)
SODIUM: 140 meq/L (ref 135–145)
Total Protein: 7.3 g/dL (ref 6.0–8.3)

## 2014-10-01 LAB — RPR

## 2014-10-01 MED ORDER — LAMIVUDINE 10 MG/ML PO SOLN: 50.0000 mg | Freq: Every day | ORAL | Status: DC

## 2014-10-01 MED ORDER — ABACAVIR SULFATE 300 MG PO TABS: 600.0000 mg | ORAL_TABLET | Freq: Every day | ORAL | Status: DC

## 2014-10-01 MED ORDER — DOLUTEGRAVIR SODIUM 50 MG PO TABS: 50.0000 mg | ORAL_TABLET | Freq: Every day | ORAL | Status: DC

## 2014-10-01 NOTE — Assessment & Plan Note (Signed)
Check lipids, LFTs

## 2014-10-01 NOTE — Assessment & Plan Note (Signed)
Greatly appreciate renal f/u.

## 2014-10-01 NOTE — Assessment & Plan Note (Signed)
Will refill his ART.  Check his labs today.  Offered/refused condoms.  He appears to be doing well.

## 2014-10-01 NOTE — Progress Notes (Signed)
   Subjective:    Patient ID: Kenneth [name], male    DOB: 1956-02-03, 59 y.o.   MRN: [id number]  HPI 59 yo M with HIV/AIDS on DTGV/3TV/ABC. He is also ESRD. He was seen by [facility] earlier this month and had AVF.  He still has some swelling from this.  No problems with ART. No problems with HD.   HIV 1 RNA QUANT (copies/mL)  Date Value  03/17/2014 109*  02/13/2014 <20  11/11/2013 <20   CD4 T CELL ABS (/uL)  Date Value  06/22/2014 370*  03/17/2014 100*  02/13/2014 240*     Review of Systems  Constitutional: Negative for fever, chills, appetite change and unexpected weight change.  Respiratory: Negative for shortness of breath.   Cardiovascular: Negative for chest pain.  Gastrointestinal: Negative for diarrhea and constipation.  Genitourinary: Positive for decreased urine volume.  Neurological: Negative for headaches.      Objective:   Physical Exam  Constitutional: He appears well-developed and well-nourished.  HENT:  Mouth/Throat: No oropharyngeal exudate.  Eyes: EOM are normal. Pupils are equal, round, and reactive to light.  Neck: Neck supple.  Cardiovascular: Normal rate, regular rhythm and normal heart sounds.   Pulmonary/Chest: Effort normal and breath sounds normal.  Abdominal: Soft. Bowel sounds are normal. He exhibits no distension. There is no tenderness.  Musculoskeletal: He exhibits no edema.       Arms: Lymphadenopathy:    He has no cervical adenopathy.       Assessment & Plan:

## 2014-10-02 LAB — T-HELPER CELL (CD4) - (RCID CLINIC ONLY)
CD4 % Helper T Cell: 26 % — ABNORMAL LOW (ref 33–55)
CD4 T Cell Abs: 400 /uL (ref 400–2700)

## 2014-10-02 LAB — HIV-1 RNA QUANT-NO REFLEX-BLD
HIV 1 RNA Quant: <20 copies/mL (ref <20)
HIV-1 RNA Quant, Log: <1.3 {Log} (ref <1.30)

## 2014-10-06 ENCOUNTER — Encounter: Payer: BC Managed Care – PPO | Admitting: Vascular Surgery

## 2014-10-09 ENCOUNTER — Other Ambulatory Visit: Payer: Self-pay | Admitting: *Deleted

## 2014-10-12 ENCOUNTER — Other Ambulatory Visit: Payer: Self-pay | Admitting: Family Medicine

## 2014-10-12 NOTE — Telephone Encounter (Signed)
Pt called because he needs a refill on his Calcium Acetate called in. jw

## 2014-10-13 NOTE — Telephone Encounter (Signed)
LVM asking pt to call office. Please inform pt of information below. Ottis Stain, CMA

## 2014-10-13 NOTE — Telephone Encounter (Signed)
LMOVM for pt to call office to be informed of the below information. Ottis Stain, CMA

## 2014-10-13 NOTE — Telephone Encounter (Signed)
This is managed by his nephrologist. Patient should call [place] for a refill request.

## 2014-10-15 NOTE — Telephone Encounter (Signed)
Pt informed. Jazmin Ley Dawn, CMA  

## 2014-11-23 ENCOUNTER — Other Ambulatory Visit: Payer: Self-pay | Admitting: Infectious Diseases

## 2014-11-23 DIAGNOSIS — B20 Human immunodeficiency virus [HIV] disease: Secondary | ICD-10-CM

## 2015-01-04 ENCOUNTER — Other Ambulatory Visit: Payer: Self-pay | Admitting: Infectious Diseases

## 2015-02-04 ENCOUNTER — Other Ambulatory Visit: Payer: Self-pay | Admitting: Infectious Diseases

## 2015-02-18 ENCOUNTER — Ambulatory Visit (INDEPENDENT_AMBULATORY_CARE_PROVIDER_SITE_OTHER): Payer: BC Managed Care – PPO | Admitting: Family Medicine

## 2015-02-18 ENCOUNTER — Encounter: Payer: Self-pay | Admitting: Family Medicine

## 2015-02-18 VITALS — BP 105/60 | HR 102 | Temp 99.1°F | Ht 73.0 in | Wt 130.4 lb

## 2015-02-18 DIAGNOSIS — Z0000 Encounter for general adult medical examination without abnormal findings: Secondary | ICD-10-CM

## 2015-02-18 DIAGNOSIS — M654 Radial styloid tenosynovitis [de Quervain]: Secondary | ICD-10-CM

## 2015-02-18 DIAGNOSIS — R630 Anorexia: Secondary | ICD-10-CM | POA: Diagnosis not present

## 2015-02-18 DIAGNOSIS — F329 Major depressive disorder, single episode, unspecified: Secondary | ICD-10-CM

## 2015-02-18 DIAGNOSIS — R634 Abnormal weight loss: Secondary | ICD-10-CM

## 2015-02-18 DIAGNOSIS — R4589 Other symptoms and signs involving emotional state: Secondary | ICD-10-CM | POA: Insufficient documentation

## 2015-02-18 MED ORDER — MIRTAZAPINE 15 MG PO TABS: 15.0000 mg | ORAL_TABLET | Freq: Every day | ORAL | Status: DC

## 2015-02-18 NOTE — Assessment & Plan Note (Signed)
Possibly related to patient's metallic taste. He has been started on megace which has not helped much. Will start Remeron to help with this. Close follow-up for increased caloric intake.

## 2015-02-18 NOTE — Progress Notes (Signed)
Subjective    Kenneth [name] is a 59 y.o. male that presents for yearly physical exam.   Concerns:  1. Not feeling well: he reports joint pain and weight loss for the last three weeks. These arthralgias come and go. He has no associated joint swelling but has some paresthesias in his feet. He has decreased grip strength. Walking up steps is difficult secondary to pain in knees. His appetite has decreased and he has been prescribed medication to help in this regard. He has an appointment with infectious disease in 4 days.   Goals    None      Past Medical History  Diagnosis Date  . HIV infection ([place]) dx ~ 2009    a. 02/13/2014 CD4 = 240 - undetectable viral load.  . Hyperlipidemia   . Hypertension   . Insomnia   . Dysphagia 2008    post heller myotomy/toupee fundoplication.    . Achalasia   . Renal abscess   . Hemorrhoids, internal   . Premature ventricular contractions   . Pancytopenia ([place])   . CKD (chronic kidney disease), stage II   . Lupus nephritis ([place])   . SLE (systemic lupus erythematosus) ([place]) dx'd 2015  . Candida infection, esophageal ([place]) 08/2010    a. 2012 - noted on EGD.  Marland Kitchen DVT (deep venous thrombosis) ([place]) ~ 04/2014  . History of blood transfusion 03/2014    "low counts"  . History of stomach ulcers     Past Surgical History  Procedure Laterality Date  . Nephrectomy Right ~ 2011  . Colonoscopy  2008    .  tortuous colon, internal hemorrhoids.  no polyps or diverticulosis.   . Esophagogastroduodenoscopy  08/2010    Dr [name].  08/2010 candida esophagitis, no obvious esophageal stricture. 02/2006 and 05/2006 dilated esophagus, narrowed distal esophagus but no stricture, was empirically Savary dilated  . Heller myotomy  1999    with toupee fundoplication of HH.   Marland Kitchen Esophagogastroduodenoscopy N/A 06/27/2014    Procedure: ESOPHAGOGASTRODUODENOSCOPY (EGD);  Surgeon: [name], MD;  Location[facility] ENDOSCOPY;  Service: Endoscopy;  Laterality: N/A;  .  Bascilic vein transposition Left 06/29/2014    Procedure: FIRST STAGE  [place];  Surgeon: Rosetta Posner, MD;  Location: [place];  Service: Vascular;  Laterality: Left;  . Insertion of dialysis catheter Right 06/29/2014    Procedure: INSERTION OF DIALYSIS CATHETER RIGHT IJ;  Surgeon: Rosetta Posner, MD;  Location: [facility] OR;  Service: Vascular;  Laterality: Right;  . Esophagogastroduodenoscopy N/A 07/02/2014    Procedure: ESOPHAGOGASTRODUODENOSCOPY (EGD);  Surgeon: [name], MD;  Location[facility] ENDOSCOPY;  Service: Endoscopy;  Laterality: N/A;  with botox  . Bascilic vein transposition Left 09/11/2014    Procedure: SECOND STAGE BASILIC VEIN TRANSPOSITION LEFT UPPER ARM;  Surgeon: Rosetta Posner, MD;  Location: [facility] OR;  Service: Vascular;  Laterality: Left;    Current Outpatient Prescriptions on File Prior to Visit  Medication Sig Dispense Refill  . abacavir (ZIAGEN) 300 MG tablet Take 2 tablets (600 mg total) by mouth daily. 60 tablet 5  . amLODipine (NORVASC) 5 MG tablet Take 5 mg by mouth daily.  0  . atorvastatin (LIPITOR) 40 MG tablet Take 1 tablet (40 mg total) by mouth daily. 30 tablet 5  . calcium acetate (PHOSLO) 667 MG capsule Take 2 capsules (1,334 mg total) by mouth 3 (three) times daily with meals. 180 capsule 0  . dolutegravir (TIVICAY) 50 MG tablet Take 1 tablet (  50 mg total) by mouth daily. 90 tablet 3  . lamiVUDine (EPIVIR) 10 MG/ML solution Take 5 mLs (50 mg total) by mouth daily. 480 mL 3  . multivitamin (RENA-VIT) TABS tablet     . TIVICAY 50 MG tablet take 1 tablet by mouth daily 30 tablet 4   No current facility-administered medications on file prior to visit.    Allergies  Allergen Reactions  . Amoxicillin Other (See Comments)    REACTION: diffuse rash    Social History   Social History  . Marital Status: Single    Spouse Name: N/A  . Number of Children: N/A  . Years of Education: N/A   Social History Main Topics  . Smoking status: Never Smoker   .  Smokeless tobacco: Never Used  . Alcohol Use: No  . Drug Use: No  . Sexual Activity:    Partners: Male   Other Topics Concern  . None   Social History Narrative   Lives in [place] by himself. Does not routinely exercise.    Family History  Problem Relation Age of Onset  . Colon cancer Father     deceased  . Other Mother     s/p pacemaker - alive and well.  . Hypertension Mother   . Other      5 brothers, 3 sisters - alive and well.    ROS  General: unexplained weight loss. No fevers HEENT: sneezing, rhinorrhea Lungs: Cough Neurological: weakness Musculoskeletal: joint pain, muscle cramps/aches All other review of symptoms assessed and are negative.  Objective   BP 105/60 mmHg  Pulse 102  Temp(Src) 99.1 F (37.3 C) (Oral)  Ht 6\' 1"  (1.854 m)  Wt 130 lb 6.4 oz (59.149 kg)  BMI 17.21 kg/m2  General: chronically ill appearing, no distress, bitemporal wasting noted HEENT: Pupils equal and reactive to light/accomodation. Extraocular movements intact bilaterally. Tympanic membranes normal bilaterally. Nares patent bilaterally. Oropharnx clear and moist. No cervical adenopathy bilaterally Respiratory/Chest: Clear to auscultation bilaterally Cardiovascular: Regular rate and rhythm, systolic murmuc Gastrointestinal: Soft, non-tender, non-distended Genitourinary: Not examined    Musculoskeletal: Diffuse muscle atrophy noted. Knees with full range of motion and no tenderness. Tenderness in distal radial area of left hand. Positive Finkelstein's test. Neuro: Alert, oriented Dermatologic: No obvious rashes Psychiatric: Flat affect, decreased eye contact  Meds ordered this encounter  Medications  . megestrol (MEGACE) 40 MG/ML suspension    Sig: Take 400 mg by mouth daily.  . mirtazapine (REMERON) 15 MG tablet    Sig: Take 1 tablet (15 mg total) by mouth at bedtime.    Dispense:  30 tablet    Refill:  0    Assessment and Plan    Health Maintenance Due  Topic Date Due  .  INFLUENZA VACCINE  11/02/2014  . TETANUS/TDAP  11/02/2014   Unintentional weight loss Possibly related to patient's metallic taste. He has been started on megace which has not helped much. Will start Remeron to help with this. Close follow-up for increased caloric intake.  Depressed mood Patient does not believe this is a problem. Will treat with Remeron since treating for decreased appetite as well. Will follow closely.

## 2015-02-18 NOTE — Assessment & Plan Note (Addendum)
Patient does not believe this is a problem. Will treat with Remeron since treating for decreased appetite as well. Will follow closely.

## 2015-02-18 NOTE — Patient Instructions (Addendum)
Thank you for coming to see me today. It was a pleasure. Today we talked about:   Joint pain: I will check with your nephrologist to see if I can give you some Nsaids  Weight loss/appetite: I am prescribing Remeron. Please take this daily at night.   Please make an appointment to see me in 2 weeks for follow-up.  If you have any questions or concerns, please do not hesitate to call the office at [phone].  Sincerely,  Cordelia Poche, MD     Alfonse Ras Disease Alfonse Ras disease is inflammation of the tendon on the thumb side of the wrist. Tendons are cords of tissue that connect bones to muscles. The tendons in your hand pass through a tunnel, or sheath. A slippery layer of tissue (synovium) lets the tendons move smoothly in the sheath. With de Quervain disease, the sheath swells or thickens, causing friction and pain. The condition is also called de Quervain tendinosis and de Quervain syndrome. It occurs most often in women who are 62-59 years old. CAUSES  The exact cause of de Quervain disease is not known. It may result from:   Overusing your hands, especially with repetitive motions that involve twisting your hand or using a forceful grip.  Pregnancy.  Rheumatoid disease. RISK FACTORS You may have a greater risk for de Quervain disease if you:  Are a middle-aged woman.  Are pregnant.  Have rheumatoid arthritis.  Have diabetes.  Use your hands far more than normal, especially with a tight grip or excessive twisting. SIGNS AND SYMPTOMS Pain on the thumb side of your wrist is the main symptom of de Quervain disease. Other signs and symptoms include:  Pain that gets worse when you grasp something or turn your wrist.  Pain that extends up the forearm.  Cysts in the area of the pain.  Swelling of your wrist and hand.  A sensation of snapping in the wrist.  Trouble moving the thumb and wrist. DIAGNOSIS  Your health care provider may diagnose de Quervain  disease based on your signs and symptoms. A physical exam will also be done. A simple test Wynn Maudlin test) that involves pulling your thumb and wrist to see if this causes pain can help determine whether you have the condition. Sometimes you may need to have an X-ray.  TREATMENT  Avoiding any activity that causes pain and swelling is the best treatment. Other options include:  Wearing a splint.  Taking medicine. Anti-inflammatory medicines and corticosteroid injections may reduce inflammation and relieve pain.  Having surgery if other treatments do not work. HOME CARE INSTRUCTIONS   Using ice can be helpful after doing activities that involve the sore wrist. To apply ice to the injured area:  Put ice in a plastic bag.  Place a towel between your skin and the bag.  Leave the ice on for 20 minutes, 2-3 times a day.  Take medicines only as directed by your health care provider.  Wear your splint as directed. This will allow your hand to rest and heal. SEEK MEDICAL CARE IF:   Your pain medicine does not help.   Your pain gets worse.  You develop new symptoms. MAKE SURE YOU:   Understand these instructions.  Will watch your condition.  Will get help right away if you are not doing well or get worse.   This information is not intended to replace advice given to you by your health care provider. Make sure you discuss any questions you have  with your health care provider.   Document Released: 12/13/2000 Document Revised: 04/10/2014 Document Reviewed: 07/23/2013 Elsevier Interactive Patient Education [employer].

## 2015-02-22 ENCOUNTER — Emergency Department (HOSPITAL_COMMUNITY)
Admission: EM | Admit: 2015-02-22 | Discharge: 2015-02-22 | Disposition: A | Discharge status: Home / Self Care | Payer: BC Managed Care – PPO | Source: Home / Self Care | Attending: Emergency Medicine | Admitting: Emergency Medicine

## 2015-02-22 ENCOUNTER — Encounter (HOSPITAL_COMMUNITY): Payer: Self-pay | Admitting: *Deleted

## 2015-02-22 DIAGNOSIS — B20 Human immunodeficiency virus [HIV] disease: Secondary | ICD-10-CM

## 2015-02-22 DIAGNOSIS — M2550 Pain in unspecified joint: Secondary | ICD-10-CM

## 2015-02-22 DIAGNOSIS — M3219 Other organ or system involvement in systemic lupus erythematosus: Secondary | ICD-10-CM | POA: Diagnosis not present

## 2015-02-22 DIAGNOSIS — R531 Weakness: Secondary | ICD-10-CM | POA: Diagnosis not present

## 2015-02-22 LAB — CBC WITH DIFFERENTIAL/PLATELET
BASOS ABS: 0 10*3/uL (ref 0.0–0.1)
BASOS PCT: 0 %
Eosinophils Absolute: 0 10*3/uL (ref 0.0–0.7)
Eosinophils Relative: 0 %
HEMATOCRIT: 31.9 % — AB (ref 39.0–52.0)
Hemoglobin: 10.3 g/dL — ABNORMAL LOW (ref 13.0–17.0)
LYMPHS PCT: 13 %
Lymphs Abs: 0.7 10*3/uL (ref 0.7–4.0)
MCH: 31 pg (ref 26.0–34.0)
MCHC: 32.3 g/dL (ref 30.0–36.0)
MCV: 96.1 fL (ref 78.0–100.0)
Monocytes Absolute: 0.4 10*3/uL (ref 0.1–1.0)
Monocytes Relative: 8 %
NEUTROS ABS: 4.3 10*3/uL (ref 1.7–7.7)
NEUTROS PCT: 79 %
Platelets: 152 10*3/uL (ref 150–400)
RBC: 3.32 MIL/uL — AB (ref 4.22–5.81)
RDW: 15.4 % (ref 11.5–15.5)
WBC: 5.4 10*3/uL (ref 4.0–10.5)

## 2015-02-22 LAB — BASIC METABOLIC PANEL
Anion gap: 15 (ref 5–15)
BUN: 19 mg/dL (ref 6–20)
CALCIUM: 7.9 mg/dL — AB (ref 8.9–10.3)
CHLORIDE: 94 mmol/L — AB (ref 101–111)
CO2: 27 mmol/L (ref 22–32)
CREATININE: 4.41 mg/dL — AB (ref 0.61–1.24)
GFR, EST AFRICAN AMERICAN: 16 mL/min — AB (ref >60)
GFR, EST NON AFRICAN AMERICAN: 14 mL/min — AB (ref >60)
Glucose, Bld: 110 mg/dL — ABNORMAL HIGH (ref 65–99)
Potassium: 3.6 mmol/L (ref 3.5–5.1)
SODIUM: 136 mmol/L (ref 135–145)

## 2015-02-22 LAB — CBC
HCT: 34.2 % — ABNORMAL LOW (ref 39.0–52.0)
Hemoglobin: 10.9 g/dL — ABNORMAL LOW (ref 13.0–17.0)
MCH: 30.4 pg (ref 26.0–34.0)
MCHC: 31.9 g/dL (ref 30.0–36.0)
MCV: 95.3 fL (ref 78.0–100.0)
PLATELETS: 162 10*3/uL (ref 150–400)
RBC: 3.59 MIL/uL — AB (ref 4.22–5.81)
RDW: 15.4 % (ref 11.5–15.5)
WBC: 6 10*3/uL (ref 4.0–10.5)

## 2015-02-22 LAB — C-REACTIVE PROTEIN: CRP: 19.3 mg/dL — AB (ref <1.0)

## 2015-02-22 LAB — SEDIMENTATION RATE: Sed Rate: 124 mm/hr — ABNORMAL HIGH (ref 0–16)

## 2015-02-22 MED ORDER — OXYCODONE-ACETAMINOPHEN 5-325 MG PO TABS: 2.0000 | ORAL_TABLET | ORAL | Status: DC | PRN

## 2015-02-22 MED ORDER — HYDROMORPHONE HCL 1 MG/ML IJ SOLN
1.0000 mg | Freq: Once | INTRAMUSCULAR | Status: AC
  Administered 2015-02-22: 1 mg via INTRAMUSCULAR
  Filled 2015-02-22: qty 1

## 2015-02-22 MED ORDER — OXYCODONE-ACETAMINOPHEN 5-325 MG PO TABS
2.0000 | ORAL_TABLET | Freq: Once | ORAL | Status: AC
  Administered 2015-02-22: 2 via ORAL
  Filled 2015-02-22: qty 2

## 2015-02-22 MED ORDER — HYDROMORPHONE HCL 1 MG/ML IJ SOLN: 1.0000 mg | Freq: Once | INTRAMUSCULAR | Status: DC

## 2015-02-22 NOTE — ED Notes (Signed)
Pt c/o generalized joint pain and a 5lb weight loss since Friday. Receives dialysis on MWF with last treatment today. Hx of lupus

## 2015-02-22 NOTE — ED Provider Notes (Signed)
CSN: [id number]     Arrival date & time 02/22/15  1713 History   First MD Initiated Contact with Patient 02/22/15 1911     Chief Complaint  Patient presents with  . Joint Pain  . Weight Loss     (Consider location/radiation/quality/duration/timing/severity/associated sxs/prior Treatment) HPI Patient has had several weeks of severe joint pain. He reports all of his joints ache. This is not localizing. He also reports he's had weight loss. Patient reports 5 pounds of weight loss within the past week. He denies any fever. He has not had abdominal pain and vomiting or diarrhea. He does report anorexia. Patient does have HIV and is compliant with medications. He also has diagnosis of lupus. Past Medical History  Diagnosis Date  . HIV infection ([place]) dx ~ 2009    a. 02/13/2014 CD4 = 240 - undetectable viral load.  . Hyperlipidemia   . Hypertension   . Insomnia   . Dysphagia 2008    post heller myotomy/toupee fundoplication.    . Achalasia   . Renal abscess   . Hemorrhoids, internal   . Premature ventricular contractions   . Pancytopenia ([place])   . CKD (chronic kidney disease), stage II   . Lupus nephritis ([place])   . SLE (systemic lupus erythematosus) ([place]) dx'd 2015  . Candida infection, esophageal ([place]) 08/2010    a. 2012 - noted on EGD.  Marland Kitchen DVT (deep venous thrombosis) ([place]) ~ 04/2014  . History of blood transfusion 03/2014    "low counts"  . History of stomach ulcers    Past Surgical History  Procedure Laterality Date  . Nephrectomy Right ~ 2011  . Colonoscopy  2008    .  tortuous colon, internal hemorrhoids.  no polyps or diverticulosis.   . Esophagogastroduodenoscopy  08/2010    Dr [name].  08/2010 candida esophagitis, no obvious esophageal stricture. 02/2006 and 05/2006 dilated esophagus, narrowed distal esophagus but no stricture, was empirically Savary dilated  . Heller myotomy  1999    with toupee fundoplication of HH.   Marland Kitchen Esophagogastroduodenoscopy N/A 06/27/2014   Procedure: ESOPHAGOGASTRODUODENOSCOPY (EGD);  Surgeon: [name], MD;  Location: [facility] ENDOSCOPY;  Service: Endoscopy;  Laterality: N/A;  . Bascilic vein transposition Left 06/29/2014    Procedure: FIRST STAGE  [place];  Surgeon: Rosetta Posner, MD;  Location: [facility];  Service: Vascular;  Laterality: Left;  . Insertion of dialysis catheter Right 06/29/2014    Procedure: INSERTION OF DIALYSIS CATHETER RIGHT IJ;  Surgeon: Rosetta Posner, MD;  Location: [facility] OR;  Service: Vascular;  Laterality: Right;  . Esophagogastroduodenoscopy N/A 07/02/2014    Procedure: ESOPHAGOGASTRODUODENOSCOPY (EGD);  Surgeon: [name], MD;  Location[facility]COPY;  Service: Endoscopy;  Laterality: N/A;  with botox  . Bascilic vein transposition Left 09/11/2014    Procedure: SECOND STAGE BASILIC VEIN TRANSPOSITION LEFT UPPER ARM;  Surgeon: Rosetta Posner, MD;  Location: [facility] OR;  Service: Vascular;  Laterality: Left;   Family History  Problem Relation Age of Onset  . Colon cancer Father     deceased  . Other Mother     s/p pacemaker - alive and well.  . Hypertension Mother   . Other      5 brothers, 3 sisters - alive and well.   Social History  Substance Use Topics  . Smoking status: Never Smoker   . Smokeless tobacco: Never Used  . Alcohol Use: No    Review of Systems  10 Systems reviewed and are negative for  acute change except as noted in the HPI.   Allergies  Amoxicillin  Home Medications   Prior to Admission medications   Medication Sig Start Date End Date Taking? Authorizing Provider  abacavir (ZIAGEN) 300 MG tablet Take 2 tablets (600 mg total) by mouth daily. 11/24/14  Yes Thayer Headings, MD  atorvastatin (LIPITOR) 40 MG tablet Take 1 tablet (40 mg total) by mouth daily. 09/25/14  Yes Mariel Aloe, MD  calcium acetate (PHOSLO) 667 MG capsule Take 2 capsules (1,334 mg total) by mouth 3 (three) times daily with meals. 07/06/14  Yes [place] N Rumley, DO  dolutegravir (TIVICAY) 50 MG tablet Take  1 tablet (50 mg total) by mouth daily. 10/01/14  Yes Campbell Riches, MD  lamiVUDine (EPIVIR) 10 MG/ML solution Take 5 mLs (50 mg total) by mouth daily. 10/01/14  Yes Campbell Riches, MD  mirtazapine (REMERON) 15 MG tablet Take 1 tablet (15 mg total) by mouth at bedtime. 02/18/15  Yes Mariel Aloe, MD  multivitamin (RENA-VIT) TABS tablet  07/03/14  Yes Historical Provider, MD  oxyCODONE-acetaminophen (PERCOCET/ROXICET) 5-325 MG tablet Take 2 tablets by mouth every 4 (four) hours as needed for severe pain. 02/22/15   Charlesetta Shanks, MD  TIVICAY 50 MG tablet take 1 tablet by mouth daily Patient not taking: Reported on 02/22/2015 02/04/15   Campbell Riches, MD   BP 132/79 mmHg  Pulse 103  Temp(Src) 98.8 F (37.1 C) (Oral)  Resp 16  Ht 6\' 1"  (1.854 m)  Wt 126 lb (57.153 kg)  BMI 16.63 kg/m2  SpO2 99% Physical Exam  Constitutional: He is oriented to person, place, and time.  Patient is cachectic. He does not have respiratory distress is nontoxic.  HENT:  Head: Normocephalic and atraumatic.  Right Ear: External ear normal.  Left Ear: External ear normal.  Nose: Nose normal.  Lips are slightly cracked.  Eyes: EOM are normal. Pupils are equal, round, and reactive to light.  Neck: Neck supple.  Cardiovascular: Normal rate, regular rhythm, normal heart sounds and intact distal pulses.   Pulmonary/Chest: Effort normal and breath sounds normal.  Abdominal: Soft. Bowel sounds are normal. He exhibits no distension. There is no tenderness.  Musculoskeletal: Normal range of motion. He exhibits no edema.  Patient endorses pain to movement of any of the joints in his hands or his feet. Objectively there is no effusion or erythema.  Neurological: He is alert and oriented to person, place, and time. He has normal strength. No cranial nerve deficit. He exhibits normal muscle tone. Coordination normal. GCS eye subscore is 4. GCS verbal subscore is 5. GCS motor subscore is 6.  Skin: Skin is warm, dry  and intact. No rash noted.  Psychiatric: He has a normal mood and affect.    ED Course  Procedures (including critical care time) Labs Review Labs Reviewed  BASIC METABOLIC PANEL - Abnormal; Notable for the following:    Chloride 94 (*)    Glucose, Bld 110 (*)    Creatinine, Ser 4.41 (*)    Calcium 7.9 (*)    GFR calc non Af Amer 14 (*)    GFR calc Af Amer 16 (*)    All other components within normal limits  CBC - Abnormal; Notable for the following:    RBC 3.59 (*)    Hemoglobin 10.9 (*)    HCT 34.2 (*)    All other components within normal limits  URINALYSIS, ROUTINE W REFLEX MICROSCOPIC (NOT AT [facility])  CBC  WITH DIFFERENTIAL/PLATELET  SEDIMENTATION RATE  C-REACTIVE PROTEIN    Imaging Review No results found. I have personally reviewed and evaluated these images and lab results as part of my medical decision-making.   EKG Interpretation None     Consult: Patient's case was reviewed Dr. Graylon Good of infectious disease. At this time she does suggest adding CRP and sedimentation rate. Plan will be to treat the patient for pain with follow-up tomorrow with [name] as scheduled. MDM   Final diagnoses:  Polyarthralgia  HIV (human immunodeficiency virus infection) ([place])   Patient presents with HIV and polyarthralgia. He does have a diagnosis of lupus. At this time the labs do not show significant abnormality. Patient has been compliant and well-controlled on his HIV medications. He does not have fever or erythema or localizing joint effusion. At this point the plan will be to provide pain management and continued evaluation with his physician tomorrow.    Charlesetta Shanks, MD 02/22/15 [phone]

## 2015-02-22 NOTE — ED Notes (Signed)
Stood pt up to get weight, pt appears very weak. Family states that with his pain he has not been eating and has become very weak.

## 2015-02-22 NOTE — Discharge Instructions (Signed)

## 2015-02-22 NOTE — ED Notes (Signed)
Pt c/o generalized joint pain x 3 weeks or long. Also c/o weight loss. Pt was at dialysis and they instructed to come to ED with symptoms.

## 2015-02-22 NOTE — ED Notes (Signed)
The patient is unable to specimen urine at this time. The RN is aware.

## 2015-02-23 ENCOUNTER — Emergency Department (HOSPITAL_COMMUNITY): Payer: BC Managed Care – PPO

## 2015-02-23 ENCOUNTER — Encounter (HOSPITAL_COMMUNITY): Payer: Self-pay | Admitting: Emergency Medicine

## 2015-02-23 ENCOUNTER — Inpatient Hospital Stay (HOSPITAL_COMMUNITY)
Admission: EM | Admit: 2015-02-23 | Discharge: 2015-02-27 | DRG: 545 | Disposition: A | Discharge status: Home / Self Care | Payer: BC Managed Care – PPO | Attending: Family Medicine | Admitting: Family Medicine

## 2015-02-23 ENCOUNTER — Ambulatory Visit (INDEPENDENT_AMBULATORY_CARE_PROVIDER_SITE_OTHER): Payer: BC Managed Care – PPO | Admitting: Infectious Diseases

## 2015-02-23 VITALS — BP 101/63 | HR 119 | Temp 99.0°F

## 2015-02-23 DIAGNOSIS — R6884 Jaw pain: Secondary | ICD-10-CM

## 2015-02-23 DIAGNOSIS — M25561 Pain in right knee: Secondary | ICD-10-CM | POA: Diagnosis present

## 2015-02-23 DIAGNOSIS — A419 Sepsis, unspecified organism: Secondary | ICD-10-CM | POA: Diagnosis not present

## 2015-02-23 DIAGNOSIS — Z21 Asymptomatic human immunodeficiency virus [HIV] infection status: Secondary | ICD-10-CM | POA: Diagnosis present

## 2015-02-23 DIAGNOSIS — Z2821 Immunization not carried out because of patient refusal: Secondary | ICD-10-CM

## 2015-02-23 DIAGNOSIS — Z86718 Personal history of other venous thrombosis and embolism: Secondary | ICD-10-CM

## 2015-02-23 DIAGNOSIS — M25572 Pain in left ankle and joints of left foot: Secondary | ICD-10-CM | POA: Diagnosis present

## 2015-02-23 DIAGNOSIS — M329 Systemic lupus erythematosus, unspecified: Secondary | ICD-10-CM | POA: Diagnosis present

## 2015-02-23 DIAGNOSIS — R7881 Bacteremia: Secondary | ICD-10-CM | POA: Diagnosis not present

## 2015-02-23 DIAGNOSIS — D649 Anemia, unspecified: Secondary | ICD-10-CM | POA: Diagnosis present

## 2015-02-23 DIAGNOSIS — B20 Human immunodeficiency virus [HIV] disease: Secondary | ICD-10-CM | POA: Diagnosis present

## 2015-02-23 DIAGNOSIS — R531 Weakness: Secondary | ICD-10-CM

## 2015-02-23 DIAGNOSIS — M2550 Pain in unspecified joint: Secondary | ICD-10-CM | POA: Diagnosis not present

## 2015-02-23 DIAGNOSIS — I10 Essential (primary) hypertension: Secondary | ICD-10-CM | POA: Diagnosis not present

## 2015-02-23 DIAGNOSIS — N186 End stage renal disease: Secondary | ICD-10-CM | POA: Diagnosis present

## 2015-02-23 DIAGNOSIS — M25541 Pain in joints of right hand: Secondary | ICD-10-CM | POA: Diagnosis present

## 2015-02-23 DIAGNOSIS — Z681 Body mass index (BMI) 19 or less, adult: Secondary | ICD-10-CM

## 2015-02-23 DIAGNOSIS — K5909 Other constipation: Secondary | ICD-10-CM | POA: Diagnosis present

## 2015-02-23 DIAGNOSIS — M3214 Glomerular disease in systemic lupus erythematosus: Secondary | ICD-10-CM | POA: Diagnosis present

## 2015-02-23 DIAGNOSIS — M25571 Pain in right ankle and joints of right foot: Secondary | ICD-10-CM | POA: Diagnosis present

## 2015-02-23 DIAGNOSIS — M3219 Other organ or system involvement in systemic lupus erythematosus: Principal | ICD-10-CM | POA: Diagnosis present

## 2015-02-23 DIAGNOSIS — M25542 Pain in joints of left hand: Secondary | ICD-10-CM | POA: Diagnosis present

## 2015-02-23 DIAGNOSIS — G4700 Insomnia, unspecified: Secondary | ICD-10-CM | POA: Diagnosis present

## 2015-02-23 DIAGNOSIS — M25562 Pain in left knee: Secondary | ICD-10-CM | POA: Diagnosis present

## 2015-02-23 DIAGNOSIS — Z992 Dependence on renal dialysis: Secondary | ICD-10-CM | POA: Diagnosis not present

## 2015-02-23 DIAGNOSIS — M25531 Pain in right wrist: Secondary | ICD-10-CM | POA: Diagnosis present

## 2015-02-23 DIAGNOSIS — E43 Unspecified severe protein-calorie malnutrition: Secondary | ICD-10-CM | POA: Diagnosis present

## 2015-02-23 DIAGNOSIS — Z905 Acquired absence of kidney: Secondary | ICD-10-CM

## 2015-02-23 DIAGNOSIS — N2581 Secondary hyperparathyroidism of renal origin: Secondary | ICD-10-CM | POA: Diagnosis present

## 2015-02-23 DIAGNOSIS — R634 Abnormal weight loss: Secondary | ICD-10-CM | POA: Diagnosis present

## 2015-02-23 DIAGNOSIS — E785 Hyperlipidemia, unspecified: Secondary | ICD-10-CM | POA: Diagnosis present

## 2015-02-23 DIAGNOSIS — D631 Anemia in chronic kidney disease: Secondary | ICD-10-CM | POA: Diagnosis present

## 2015-02-23 DIAGNOSIS — M25532 Pain in left wrist: Secondary | ICD-10-CM | POA: Diagnosis present

## 2015-02-23 DIAGNOSIS — I120 Hypertensive chronic kidney disease with stage 5 chronic kidney disease or end stage renal disease: Secondary | ICD-10-CM | POA: Diagnosis present

## 2015-02-23 DIAGNOSIS — E860 Dehydration: Secondary | ICD-10-CM | POA: Diagnosis present

## 2015-02-23 DIAGNOSIS — R627 Adult failure to thrive: Secondary | ICD-10-CM | POA: Diagnosis present

## 2015-02-23 LAB — CBC WITH DIFFERENTIAL/PLATELET
BASOS PCT: 0 %
Basophils Absolute: 0 10*3/uL (ref 0.0–0.1)
EOS PCT: 0 %
Eosinophils Absolute: 0 10*3/uL (ref 0.0–0.7)
HEMATOCRIT: 31.7 % — AB (ref 39.0–52.0)
HEMOGLOBIN: 10.3 g/dL — AB (ref 13.0–17.0)
LYMPHS PCT: 8 %
Lymphs Abs: 0.6 10*3/uL — ABNORMAL LOW (ref 0.7–4.0)
MCH: 31.1 pg (ref 26.0–34.0)
MCHC: 32.5 g/dL (ref 30.0–36.0)
MCV: 95.8 fL (ref 78.0–100.0)
MONOS PCT: 7 %
Monocytes Absolute: 0.5 10*3/uL (ref 0.1–1.0)
NEUTROS ABS: 6.7 10*3/uL (ref 1.7–7.7)
Neutrophils Relative %: 85 %
Platelets: 157 10*3/uL (ref 150–400)
RBC: 3.31 MIL/uL — ABNORMAL LOW (ref 4.22–5.81)
RDW: 15.5 % (ref 11.5–15.5)
WBC: 7.8 10*3/uL (ref 4.0–10.5)

## 2015-02-23 LAB — COMPREHENSIVE METABOLIC PANEL
ALBUMIN: 2.3 g/dL — AB (ref 3.5–5.0)
ALK PHOS: 37 U/L — AB (ref 38–126)
ALT: 22 U/L (ref 17–63)
ANION GAP: 14 (ref 5–15)
AST: 58 U/L — ABNORMAL HIGH (ref 15–41)
BUN: 46 mg/dL — ABNORMAL HIGH (ref 6–20)
CHLORIDE: 93 mmol/L — AB (ref 101–111)
CO2: 27 mmol/L (ref 22–32)
Calcium: 8.1 mg/dL — ABNORMAL LOW (ref 8.9–10.3)
Creatinine, Ser: 7.53 mg/dL — ABNORMAL HIGH (ref 0.61–1.24)
GFR calc Af Amer: 8 mL/min — ABNORMAL LOW (ref >60)
GFR calc non Af Amer: 7 mL/min — ABNORMAL LOW (ref >60)
GLUCOSE: 161 mg/dL — AB (ref 65–99)
POTASSIUM: 4.3 mmol/L (ref 3.5–5.1)
SODIUM: 134 mmol/L — AB (ref 135–145)
Total Bilirubin: 0.8 mg/dL (ref 0.3–1.2)
Total Protein: 7.4 g/dL (ref 6.5–8.1)

## 2015-02-23 MED ORDER — SODIUM CHLORIDE 0.9 % IV SOLN
INTRAVENOUS | Status: AC
  Administered 2015-02-23: 21:00:00 via INTRAVENOUS

## 2015-02-23 NOTE — ED Notes (Signed)
Pt states that he does not make urine.  

## 2015-02-23 NOTE — H&P (Signed)
Family Medicine Teaching Service Hospital Admission History and Physical Service Pager: [phone]  Patient name: Kenneth [name] Medical record number: [id number] Date of birth: 11/17/1955 Age: 59 y.o. Gender: male  Primary Care Provider: Ralph Nettey, MD Consultants: none Code Status: Full (confirmed on admission)  Chief Complaint: Multiple joint pains, Reduced appetite, Generalized weakness  Assessment and Plan: Kenneth [name] is a 59 y.o. male presenting with chronic worsening bilateral multiple arthralgias (hands, wrists, knees, feet) over past >3 weeks (similar prior history), assoc with reduced appetite and generalized weakness / deconditioning. PMH is significant for HIV (well controlled on ART), ESRD (on HD), Lupus with h/o Lupus Nephritis, HTN, protein calorie malnutrition, h/o achalasia (s/p botox by GI).  # Generalized Weakness w/ Arthralgias, b/l, symmetrical, sm joints - suspected mild-mod SLE flare Consistent with subacute on chronic problem now > 3 weeks with gradual worsening, similar to prior episodes, especially last year 02/2014 when diagnosed Lupus (no longer on chronic therapy). Clinically with multiple symmetrical small joints without any evidence of effusion or infection, pain worse with movement but not reproducible to touch. Low-grade temp on admit to 100.1F without outside fevers/chills. Does not meet SIRs criteria (only 1/4 with mild tachy mid 90s on arrival, since resolved, WBC nml, no O2, temp < 100.4, and RR nml), patient sent to ED from ID for tachycardia (HR 119) and concern decreased aletness, and initial concern was for possible infection. No source clinically available. CXR clear, skin clear (no decubitus ulcer), anuric. On admit, tired with improved alertness and fully oriented. Considered possible rhabdo with generalized pains and relative immobility recently. - Admit to FPTS, inpatient, telemetry (can DC tomorrow if hemodynamically stable) - No empiric  antibiotics on admit - Follow fever curve (Tmax 100.1, not considered fever), hold Tylenol - Lactic acid protocol > 1.7 (normal) > - Trend recent CRP 19.3 (11/21) > - CK, total 444 (elevated, but not consistent with rhabdo) > down to 438 - Follow blood cultures x 2 in ED, no urine culture (anuric) - Pain management with Percocet 1-2 tabs q 4 hr PRN pain, add Baclofen PRN - K pad - Bowel regimen - Start prednisone 20mg daily for suspected lupus flare, consider discussing with outpatient rheum, may need brief course / taper on steroids, and consider future hydroxychloroquine - PT/OT eval to determine disposition  # Reduced Appetite, poor PO with mild dehydration, likely secondary to above pain and flare Suspected secondary to primary problem of joint pain with likely lupus flare. Seems to be limited by joint pain and not any GI red flags. No abdominal pain. Without nausea, vomiting. Elevated BUN to 48 (previously lower) could support dehydration. - Continued Mirtazapine 15mg at night (recently started 11/17, had been compliant at home), goal to stimulate appetite - Zofran PRN nausea - May be underlying depression per PCP - Consult nutrition, add boost TID  # ESRD, on HD (M-W-F), in setting of prolif Lupus Nephritis, FSG, solitary kidney s/p R-nephrectomy Last HD on Monday 11/21. No missed treatments recently. Followed by [facility]. Diagnosed ESRD in 06/2014 subsequent access with L-arm AVF. Patient is anuric. - Daily wt, strict I/O - Consult Nephrology to resume inpatient HD as scheduled 11/23 - Check daily renal function panel - continue home phoslo  # Anemia, chronic, in setting of ESRD Hgb on admit 10.3, stable. Seems improved from previous baseline data in 07/2014, however this may be new baseline after prior iron and ESA treatments. May be hemoconcentrated but will trend CBC. - Follow CBC -   Consult Nephrology, last iron studies 06/2014. Consider further therapy.  # HIV, well controlled on  ART Followed by ID (Dr Hatcher), last seen on 02/23/15. - Continue home Dolutegravir, Abacavir, Lamivudine  # HTN - Stable Normal range on admit without hypotension. Currently not on any anti-HTN meds - Monitor  # HLD Hold lipitor on admit given complaints of joint pain.  # Protein Calorie Malnutrition, severe Weight loss about 5 lbs in 1 week, and about 20 lb in 5 months. Poor appetite and reduced PO. - Advance diet as tolerated - Nutrition consult - Add boost TID  # Constipation, chronic Last BM 1 week, with poor PO likely. - Start bowel regimen 1 tab senna-docusate at night scheduled while on narcotics. Can increase to 2 or add miralax PRN.  FEN/GI: gentle IVF 100cc/hr x 8 hours since admit then SLIV, Heart healthy diet Prophylaxis: Heparin SQ (no lovenox with ESRD)  Disposition: Admit to inpatient for worsening constellation of symptoms suggestive of subacute Lupus flare with worsening arthralgias, no evidence of acute infection initial work-up for sepsis but hold antibiotics given hemodynamically stable and negative markers. Proceed with HD as scheduled. Disposition unclear, pending PT/OT eval, may require SNF.  History of Present Illness:  Kenneth [name] is a 59 y.o. male presenting with chronic worsening joint pains over past >3 weeks, same as last discussed at OV 02/18/15 with PCP. Additionally with decreased appetite and poor PO intake over the same time period. Currently reports pain in both hands, both knees, both feet, sometimes upper arms. No chest, abdominal or back pain. Describes pain as 10/10 severity, constant aching pain. Pain is worse moving, adjusting self in bed, ambulating, lifting. At rest pain is persistent, about 8/10. No significant relief, except from IV pain medicine in ED 11/21, given Percocet rx 11/21 but did not fill this has paperwork and printed rx with him today. Also tried Tylenol 500mg daily without relief for several days then stopped. Has not been on  muscle relaxant recently.  He reports interval history with similar joint pain 1 year ago in 02/2014 when he was hospitalized and eventually diagnosed with Renal Failure and first diagnosed with Lupus. He was started on prednisone at that time and had lab work-up confirming lupus, referred to Rheumatologist, followed up only once and does not recall any further therapy, denies long-term prednisone or treatment for lupus. He has continued to follow with Nephrology and has since been ESRD and on HD since early 2016, with access Left arm AVF.  He saw Rheumatologist about 1 year ago for Lupus, he has not been on any medications for this long-term. Difficulty recalling his prior prednisone course, but seems to think this was for several weeks early 2016 and then stopped. Denies any steroid or prednisone use over past several months.  Admits intermittent diarrhea Admits decreased appetite with worsening joint pain (not abdominal pain), also metallic taste to food, also reduced drinking fluids. Admits off balance occasionally,  Denies any fevers/chills, sweats, nausea, vomiting.   Dry cough since Flu shot with HD. Non productive.   Review Of Systems: Per HPI with the following additions: none above Otherwise the remainder of the systems were negative.  Patient Active Problem List   Diagnosis Date Noted  . Sepsis (HCC) 02/23/2015  . Weakness 02/23/2015  . Depressed mood 02/18/2015  . Unintentional weight loss 02/18/2015  . FUO (fever of unknown origin)   . S/P dialysis catheter insertion (HCC)   . Thrombocytopenia (HCC)   . Dysphagia   .   ESRD (end stage renal disease) ([place])   . Anasarca 06/22/2014  . DVT (deep venous thrombosis) ([place]) 06/22/2014  . PVC's (premature ventricular contractions) 03/17/2014  . Diarrhea   . Abdominal pain   . Protein-calorie malnutrition, severe ([place]) 02/11/2014  . Lupus nephritis ([place])   . Arthralgia of multiple sites, bilateral 02/02/2014  . Joint swelling  02/01/2014  . Bilateral low back pain without sciatica 02/01/2014  . EXTERNAL HEMORRHOIDS WITHOUT MENTION COMP 01/20/2009  . Anemia 08/31/2006  . HIV disease ([place]) 07/24/2006  . Hyperlipidemia 05/31/2006  . ERECTILE DYSFUNCTION 05/31/2006  . Essential hypertension 05/31/2006  . INSOMNIA NOS 05/31/2006    Past Medical History: Past Medical History  Diagnosis Date  . HIV infection ([place]) dx ~ 2009    a. 02/13/2014 CD4 = 240 - undetectable viral load.  . Hyperlipidemia   . Hypertension   . Insomnia   . Dysphagia 2008    post heller myotomy/toupee fundoplication.    . Achalasia   . Renal abscess   . Hemorrhoids, internal   . Premature ventricular contractions   . Pancytopenia ([place])   . CKD (chronic kidney disease), stage II   . Lupus nephritis ([place])   . SLE (systemic lupus erythematosus) ([place]) dx'd 2015  . Candida infection, esophageal ([place]) 08/2010    a. 2012 - noted on EGD.  Marland Kitchen DVT (deep venous thrombosis) ([place]) ~ 04/2014  . History of blood transfusion 03/2014    "low counts"  . History of stomach ulcers     Past Surgical History: Past Surgical History  Procedure Laterality Date  . Nephrectomy Right ~ 2011  . Colonoscopy  2008    .  tortuous colon, internal hemorrhoids.  no polyps or diverticulosis.   . Esophagogastroduodenoscopy  08/2010    Dr [name].  08/2010 candida esophagitis, no obvious esophageal stricture. 02/2006 and 05/2006 dilated esophagus, narrowed distal esophagus but no stricture, was empirically Savary dilated  . Heller myotomy  1999    with toupee fundoplication of HH.   Marland Kitchen Esophagogastroduodenoscopy N/A 06/27/2014    Procedure: ESOPHAGOGASTRODUODENOSCOPY (EGD);  Surgeon: [name], MD;  Location: [facility] ENDOSCOPY;  Service: Endoscopy;  Laterality: N/A;  . Bascilic vein transposition Left 06/29/2014    Procedure: FIRST STAGE  [place];  Surgeon: Rosetta Posner, MD;  Location: [place];  Service: Vascular;  Laterality: Left;  . Insertion of dialysis  catheter Right 06/29/2014    Procedure: INSERTION OF DIALYSIS CATHETER RIGHT IJ;  Surgeon: Rosetta Posner, MD;  Location: [facility] OR;  Service: Vascular;  Laterality: Right;  . Esophagogastroduodenoscopy N/A 07/02/2014    Procedure: ESOPHAGOGASTRODUODENOSCOPY (EGD);  Surgeon: [name], MD;  Location: [facility] ENDOSCOPY;  Service: Endoscopy;  Laterality: N/A;  with botox  . Bascilic vein transposition Left 09/11/2014    Procedure: SECOND STAGE BASILIC VEIN TRANSPOSITION LEFT UPPER ARM;  Surgeon: Rosetta Posner, MD;  Location: [facility] OR;  Service: Vascular;  Laterality: Left;    Social History: Social History  Substance Use Topics  . Smoking status: Never Smoker   . Smokeless tobacco: Never Used  . Alcohol Use: No   Additional social history: Living at home alone, rest of family is in [place]. Normally ambulates without cane or walker.  Family friend Annie Main was visiting here in ED, gone on my arrival. We have contact #.   Please also refer to relevant sections of EMR.  Family History: Family History  Problem Relation Age of Onset  . Colon cancer Father  deceased  . Other Mother     s/p pacemaker - alive and well.  . Hypertension Mother   . Other      5 brothers, 3 sisters - alive and well.    Allergies and Medications: Allergies  Allergen Reactions  . Amoxicillin Rash and Other (See Comments)    REACTION: diffuse rash   No current facility-administered medications on file prior to encounter.   Current Outpatient Prescriptions on File Prior to Encounter  Medication Sig Dispense Refill  . abacavir (ZIAGEN) 300 MG tablet Take 2 tablets (600 mg total) by mouth daily. 60 tablet 5  . atorvastatin (LIPITOR) 40 MG tablet Take 1 tablet (40 mg total) by mouth daily. 30 tablet 5  . calcium acetate (PHOSLO) 667 MG capsule Take 2 capsules (1,334 mg total) by mouth 3 (three) times daily with meals. 180 capsule 0  . dolutegravir (TIVICAY) 50 MG tablet Take 1 tablet (50 mg total) by mouth daily. 90  tablet 3  . lamiVUDine (EPIVIR) 10 MG/ML solution Take 5 mLs (50 mg total) by mouth daily. 480 mL 3  . mirtazapine (REMERON) 15 MG tablet Take 1 tablet (15 mg total) by mouth at bedtime. 30 tablet 0  . oxyCODONE-acetaminophen (PERCOCET/ROXICET) 5-325 MG tablet Take 2 tablets by mouth every 4 (four) hours as needed for severe pain. 15 tablet 0  . TIVICAY 50 MG tablet take 1 tablet by mouth daily (Patient not taking: Reported on 02/22/2015) 30 tablet 4    Objective: BP 117/73 mmHg  Pulse 90  Temp(Src) 99.9 F (37.7 C) (Oral)  Resp 16  SpO2 100% Exam: General: chronically ill appearing, cachectic, older than stated age, tired, cooperative, NAD Eyes: PERRL, EOMI, conjunctiva and sclera clear, anicteric ENTM: oropharynx clear, mild dry mucus mem Neck: supple, non-tender, FROM, no JVD Cardiovascular: RRR (tachycardia has resolved), no murmurs Respiratory: Mostly CTAB except some mild rhonchi bibasilar improve with cough, no focal crackles, wheezing. Good air movement. Speaks full sentences. No tachypnea or resp distress.  Abdomen: soft, NTND, no masses, no rebound, +active BS MSK: extensive muscle atrophy all extremities, able to move all ext, no tenderness to palpation at any joint (shoulders, elbows, wrist, hand/fingers, knees, ankle, feet), no joint effusion or swelling, no erythema or warmth. Difficulty sitting up and leaning forward in bed, requires assistance, but distal grip strength and ankle str 5/5 and symmetric. Ext - B/l LE without edema, calf tenderness. LUE brachial AVF with palpable vibration and normal bruit on auscultation. No erythema, tenderness. Skin: warm, dry. No ulcerations or decubitus ulcers on skin survey and evaluation of sacrum. Neuro: awake, tired but alert, oriented (name, November almost 23rd, 2016, location), grossly non-focal, CN-II-XII intact, distal sensation to light touch intact. Unable to stand due to generalized weakness and joint pain. Psych: Appropriate  speech and thought content. Good insight into condition.  Labs and Imaging: CBC BMET   Recent Labs Lab 02/23/15 2015  WBC 7.8  HGB 10.3*  HCT 31.7*  PLT 157    Recent Labs Lab 02/23/15 1814  NA 134*  K 4.3  CL 93*  CO2 27  BUN 46*  CREATININE 7.53*  GLUCOSE 161*  CALCIUM 8.1*     (Recent labs ED 11/21 - CRP 19.3, ESR 124)  CK, total - 444 > 438  Lactic Acid 1.7 >  Blood culture x 2 (11/22) > pending  11/22 CXR Portable 1v No acute cardiopulmonary process.  Olin Hauser, DO 02/23/2015, 10:54 PM PGY-3, [place]  Intern pager: [phone], text pages welcome

## 2015-02-23 NOTE — Assessment & Plan Note (Signed)
The pt's hx (sx starting after HD), tachycardia, fatigue, worsened alertness raise the question of sepsis. I called [facility] who referred me to [place]. FPTS then referred pt to ED.  He is being sent there emergently.  I would suggest getting BCx and UCx, starting him on empiric anbx.  Alternatively, this could simply be a reaction to his dilaudid yesterday- we are not equipped in office to give him naloxone.

## 2015-02-23 NOTE — ED Notes (Signed)
Patient transported to X-ray 

## 2015-02-23 NOTE — ED Notes (Signed)
Annie Main (Pt family member) [phone]

## 2015-02-23 NOTE — Progress Notes (Signed)
   Subjective:    Patient ID: Kenneth [name], male    DOB: April 18, 1955, 59 y.o.   MRN: [id number]  HPI  59 yo M with HIV/AIDS on DTGV/3TV/ABC. He is also ESRD.  He has had no problems with his fistula, had HD yesterday.  Afterwards, he developed muscle and joint aches. No swelling of these.  He was seen in ED last PM for joint pain. He had a ESR that was 124 and a CRP that was 19.3.  He was given an injection of dilaudid and then given a percocet rx (which he has not filled). Today continue to have aches, anywhere he contracts his muscles.  Denies sick contacts.  Denies wounds or injuries.   HIV 1 RNA QUANT (copies/mL)  Date Value  10/01/2014 <20  03/17/2014 109*  02/13/2014 <20   CD4 T CELL ABS (/uL)  Date Value  10/01/2014 400  06/22/2014 370*  03/17/2014 100*   Review of Systems  Respiratory: Negative for cough and choking.   Gastrointestinal: Negative for diarrhea and constipation.  Musculoskeletal: Positive for myalgias and arthralgias.   Please see HPI. 12 point ROS o/w (-)     Objective:   Physical Exam  Constitutional: He appears distressed.  HENT:  Mouth/Throat: No oropharyngeal exudate.  Eyes: EOM are normal. Pupils are equal, round, and reactive to light.  Neck: Neck supple.  Cardiovascular: Tachycardia present.   Pulmonary/Chest: Effort normal and breath sounds normal.  Abdominal: Soft. Bowel sounds are normal. There is no tenderness. There is no rebound.  Musculoskeletal: He exhibits no edema.       Arms: Lymphadenopathy:    He has no cervical adenopathy.       Assessment & Plan:

## 2015-02-23 NOTE — ED Notes (Signed)
Pt here from MD office for gen weakness, pt was seen in ED yesterday for same and d/c with follow up to ID md which pt did

## 2015-02-24 DIAGNOSIS — M329 Systemic lupus erythematosus, unspecified: Secondary | ICD-10-CM | POA: Diagnosis present

## 2015-02-24 LAB — CBC WITH DIFFERENTIAL/PLATELET
BASOS PCT: 0 %
Basophils Absolute: 0 10*3/uL (ref 0.0–0.1)
EOS PCT: 0 %
Eosinophils Absolute: 0 10*3/uL (ref 0.0–0.7)
HEMATOCRIT: 29.9 % — AB (ref 39.0–52.0)
Hemoglobin: 9.5 g/dL — ABNORMAL LOW (ref 13.0–17.0)
LYMPHS ABS: 0.6 10*3/uL — AB (ref 0.7–4.0)
Lymphocytes Relative: 10 %
MCH: 30.3 pg (ref 26.0–34.0)
MCHC: 31.8 g/dL (ref 30.0–36.0)
MCV: 95.2 fL (ref 78.0–100.0)
MONO ABS: 0.6 10*3/uL (ref 0.1–1.0)
MONOS PCT: 9 %
NEUTROS ABS: 5.2 10*3/uL (ref 1.7–7.7)
Neutrophils Relative %: 81 %
PLATELETS: 157 10*3/uL (ref 150–400)
RBC: 3.14 MIL/uL — AB (ref 4.22–5.81)
RDW: 15.4 % (ref 11.5–15.5)
WBC: 6.4 10*3/uL (ref 4.0–10.5)

## 2015-02-24 LAB — TSH: TSH: 1.346 u[IU]/mL (ref 0.350–4.500)

## 2015-02-24 LAB — COMPREHENSIVE METABOLIC PANEL
ALBUMIN: 2.3 g/dL — AB (ref 3.5–5.0)
ALT: 22 U/L (ref 17–63)
AST: 50 U/L — AB (ref 15–41)
Alkaline Phosphatase: 36 U/L — ABNORMAL LOW (ref 38–126)
Anion gap: 11 (ref 5–15)
BUN: 48 mg/dL — AB (ref 6–20)
CHLORIDE: 93 mmol/L — AB (ref 101–111)
CO2: 30 mmol/L (ref 22–32)
CREATININE: 8.15 mg/dL — AB (ref 0.61–1.24)
Calcium: 7.9 mg/dL — ABNORMAL LOW (ref 8.9–10.3)
GFR calc Af Amer: 7 mL/min — ABNORMAL LOW (ref >60)
GFR, EST NON AFRICAN AMERICAN: 6 mL/min — AB (ref >60)
GLUCOSE: 120 mg/dL — AB (ref 65–99)
POTASSIUM: 4 mmol/L (ref 3.5–5.1)
SODIUM: 134 mmol/L — AB (ref 135–145)
Total Bilirubin: 0.3 mg/dL (ref 0.3–1.2)
Total Protein: 7.3 g/dL (ref 6.5–8.1)

## 2015-02-24 LAB — GLUCOSE, CAPILLARY: Glucose-Capillary: 103 mg/dL — ABNORMAL HIGH (ref 65–99)

## 2015-02-24 LAB — LACTIC ACID, PLASMA
LACTIC ACID, VENOUS: 1.2 mmol/L (ref 0.5–2.0)
LACTIC ACID, VENOUS: 1.7 mmol/L (ref 0.5–2.0)

## 2015-02-24 LAB — CK
CK TOTAL: 444 U/L — AB (ref 49–397)
Total CK: 438 U/L — ABNORMAL HIGH (ref 49–397)

## 2015-02-24 LAB — C-REACTIVE PROTEIN: CRP: 18 mg/dL — ABNORMAL HIGH (ref <1.0)

## 2015-02-24 LAB — HEPATITIS B SURFACE ANTIGEN: Hepatitis B Surface Ag: NEGATIVE

## 2015-02-24 MED ORDER — ONDANSETRON HCL 4 MG PO TABS
4.0000 mg | ORAL_TABLET | Freq: Four times a day (QID) | ORAL | Status: DC | PRN
Start: 2015-02-24 — End: 2015-02-27

## 2015-02-24 MED ORDER — MIRTAZAPINE 15 MG PO TABS
15.0000 mg | ORAL_TABLET | Freq: Every day | ORAL | Status: DC
  Administered 2015-02-24 – 2015-02-26 (×4): 15 mg via ORAL
  Filled 2015-02-24 (×4): qty 1

## 2015-02-24 MED ORDER — HEPARIN SODIUM (PORCINE) 5000 UNIT/ML IJ SOLN
5000.0000 [IU] | Freq: Three times a day (TID) | INTRAMUSCULAR | Status: DC
  Administered 2015-02-24 – 2015-02-27 (×7): 5000 [IU] via SUBCUTANEOUS
  Filled 2015-02-24 (×5): qty 1

## 2015-02-24 MED ORDER — ACETAMINOPHEN 650 MG RE SUPP: 650.0000 mg | Freq: Four times a day (QID) | RECTAL | Status: DC | PRN

## 2015-02-24 MED ORDER — ONDANSETRON HCL 4 MG/2ML IJ SOLN: 4.0000 mg | Freq: Three times a day (TID) | INTRAMUSCULAR | Status: DC | PRN

## 2015-02-24 MED ORDER — OXYCODONE-ACETAMINOPHEN 5-325 MG PO TABS
1.0000 | ORAL_TABLET | ORAL | Status: DC | PRN
  Administered 2015-02-24: 1 via ORAL
  Administered 2015-02-26: 2 via ORAL
  Filled 2015-02-24: qty 2
  Filled 2015-02-24: qty 1
  Filled 2015-02-24: qty 2

## 2015-02-24 MED ORDER — LAMIVUDINE 10 MG/ML PO SOLN
50.0000 mg | Freq: Every day | ORAL | Status: DC
  Administered 2015-02-24 – 2015-02-27 (×4): 50 mg via ORAL
  Filled 2015-02-24 (×4): qty 5

## 2015-02-24 MED ORDER — SODIUM CHLORIDE 0.9 % IV SOLN: 100.0000 mL | INTRAVENOUS | Status: DC | PRN

## 2015-02-24 MED ORDER — HEPARIN SODIUM (PORCINE) 1000 UNIT/ML DIALYSIS: 20.0000 [IU]/kg | INTRAMUSCULAR | Status: DC | PRN

## 2015-02-24 MED ORDER — CALCIUM ACETATE (PHOS BINDER) 667 MG PO CAPS
1334.0000 mg | ORAL_CAPSULE | Freq: Three times a day (TID) | ORAL | Status: DC
  Administered 2015-02-24 – 2015-02-27 (×8): 1334 mg via ORAL
  Filled 2015-02-24 (×8): qty 2

## 2015-02-24 MED ORDER — DOLUTEGRAVIR SODIUM 50 MG PO TABS
50.0000 mg | ORAL_TABLET | Freq: Every day | ORAL | Status: DC
  Administered 2015-02-24 – 2015-02-27 (×4): 50 mg via ORAL
  Filled 2015-02-24 (×4): qty 1

## 2015-02-24 MED ORDER — ATORVASTATIN CALCIUM 40 MG PO TABS: 40.0000 mg | ORAL_TABLET | Freq: Every day | ORAL | Status: DC

## 2015-02-24 MED ORDER — CALCITRIOL 0.5 MCG PO CAPS
1.0000 ug | ORAL_CAPSULE | ORAL | Status: DC
Start: 2015-02-26 — End: 2015-02-27
  Administered 2015-02-26: 1 ug via ORAL
  Filled 2015-02-24: qty 2

## 2015-02-24 MED ORDER — PREDNISONE 20 MG PO TABS
20.0000 mg | ORAL_TABLET | Freq: Every day | ORAL | Status: DC
  Administered 2015-02-24: 20 mg via ORAL
  Filled 2015-02-24 (×2): qty 1

## 2015-02-24 MED ORDER — BACLOFEN 10 MG PO TABS: 5.0000 mg | ORAL_TABLET | Freq: Three times a day (TID) | ORAL | Status: DC | PRN

## 2015-02-24 MED ORDER — RENA-VITE PO TABS
1.0000 | ORAL_TABLET | Freq: Every day | ORAL | Status: DC
  Administered 2015-02-24 – 2015-02-26 (×3): 1 via ORAL
  Filled 2015-02-24 (×4): qty 1

## 2015-02-24 MED ORDER — ALTEPLASE 2 MG IJ SOLR
2.0000 mg | Freq: Once | INTRAMUSCULAR | Status: DC | PRN
  Filled 2015-02-24: qty 2

## 2015-02-24 MED ORDER — SENNOSIDES-DOCUSATE SODIUM 8.6-50 MG PO TABS
1.0000 | ORAL_TABLET | Freq: Every day | ORAL | Status: DC
  Administered 2015-02-24 – 2015-02-26 (×3): 1 via ORAL
  Filled 2015-02-24 (×3): qty 1

## 2015-02-24 MED ORDER — SODIUM CHLORIDE 0.9 % IJ SOLN
3.0000 mL | Freq: Two times a day (BID) | INTRAMUSCULAR | Status: DC
  Administered 2015-02-24 – 2015-02-27 (×7): 3 mL via INTRAVENOUS

## 2015-02-24 MED ORDER — ACETAMINOPHEN 325 MG PO TABS: 650.0000 mg | ORAL_TABLET | Freq: Four times a day (QID) | ORAL | Status: DC | PRN

## 2015-02-24 MED ORDER — ENSURE ENLIVE PO LIQD: 237.0000 mL | Freq: Two times a day (BID) | ORAL | Status: DC

## 2015-02-24 MED ORDER — LIDOCAINE HCL (PF) 1 % IJ SOLN: 5.0000 mL | INTRAMUSCULAR | Status: DC | PRN

## 2015-02-24 MED ORDER — NEPRO/CARBSTEADY PO LIQD
237.0000 mL | Freq: Two times a day (BID) | ORAL | Status: DC
  Administered 2015-02-25 – 2015-02-26 (×2): 237 mL via ORAL
  Filled 2015-02-24: qty 237

## 2015-02-24 MED ORDER — PENTAFLUOROPROP-TETRAFLUOROETH EX AERO: 1.0000 "application " | INHALATION_SPRAY | CUTANEOUS | Status: DC | PRN

## 2015-02-24 MED ORDER — LIDOCAINE-PRILOCAINE 2.5-2.5 % EX CREA
1.0000 "application " | TOPICAL_CREAM | CUTANEOUS | Status: DC | PRN
  Filled 2015-02-24: qty 5

## 2015-02-24 MED ORDER — PRO-STAT SUGAR FREE PO LIQD
30.0000 mL | Freq: Two times a day (BID) | ORAL | Status: DC
  Administered 2015-02-24 – 2015-02-27 (×6): 30 mL via ORAL
  Filled 2015-02-24 (×7): qty 30

## 2015-02-24 MED ORDER — PNEUMOCOCCAL VAC POLYVALENT 25 MCG/0.5ML IJ INJ
0.5000 mL | INJECTION | INTRAMUSCULAR | Status: DC
  Filled 2015-02-24 (×2): qty 0.5

## 2015-02-24 MED ORDER — BOOST / RESOURCE BREEZE PO LIQD: 1.0000 | Freq: Three times a day (TID) | ORAL | Status: DC

## 2015-02-24 MED ORDER — VANCOMYCIN HCL 10 G IV SOLR
1250.0000 mg | Freq: Once | INTRAVENOUS | Status: AC
  Administered 2015-02-24: 1250 mg via INTRAVENOUS
  Filled 2015-02-24: qty 1250

## 2015-02-24 MED ORDER — ONDANSETRON HCL 4 MG/2ML IJ SOLN: 4.0000 mg | Freq: Four times a day (QID) | INTRAMUSCULAR | Status: DC | PRN

## 2015-02-24 MED ORDER — VANCOMYCIN HCL 500 MG IV SOLR
500.0000 mg | INTRAVENOUS | Status: DC
  Filled 2015-02-24 (×2): qty 500

## 2015-02-24 MED ORDER — ABACAVIR SULFATE 300 MG PO TABS
600.0000 mg | ORAL_TABLET | Freq: Every day | ORAL | Status: DC
  Administered 2015-02-24 – 2015-02-27 (×4): 600 mg via ORAL
  Filled 2015-02-24 (×5): qty 2

## 2015-02-24 NOTE — Discharge Summary (Signed)
[facility] Discharge Summary  Patient name: Kenneth [name] Medical record number: [id number] Date of birth: 1955-10-28 Age: 59 y.o. Gender: male Date of Admission: 02/23/2015  Date of Discharge: 02/27/2015 Admitting Physician: Leeanne Rio, MD  Primary Care Provider: Cordelia Poche, MD Consultants: ID  Indication for Hospitalization: Worsening bilateral arthralgias, concern for Lupus Flare, Generalized Weakness, poor PO intake  Discharge Diagnoses/Problem List:  SLE flare Reduced appetite, poor PO with mild dehydration ESRD on HD, 2/2 lupus nephritis Anemia, chronic, in setting of ESRD HIV, well controlled on ART HTN HLD Protein Calorie Malnutrition, severe Constipation  Disposition: Home  Discharge Condition: Stable  Discharge Exam:  General: Cachetic man, sitting up in bed, eating breakfast, in NAD. HEENT: Temporal wasting present bilaterally, ptosis of left eye. MMM. Poor dentition.  Cardiovascular: RRR, S1, S2, no m/r/g Respiratory: CTAB Abdomen: +BS, soft, NTND Extremities: No LE edema, no erythema or swelling of joints.  Brief Hospital Course:  Kenneth [name] is a 59 year old male PMH HIV, Lupus, ESRD on HD, who presented with subacute on chronic complaints of worsening bilateral symmetrical arthralgias, generalized weakness, and reduced appetite over past >3 weeks. Last seen at [facility] 11/17 started mirtazapine for appetite and possible depression, pain remained uncontrolled, after HD on 11/21 was sent to ED given weakness and worsening joint pain, work-up negative and discharged home with percocet (never filled), followed up with ID on 11/22 and sent back to ED that day with persistent tachycardia (HR 119) concern possible underlying infection.  In ED, patient hemodynamically stable including HR in mid 90s with apparent resolved tachycardia, temp was low-grade to 100.69F without fever. ED obtained labs and blood cultures, did not start  antibiotics yet, consulted FPTS for admission for further work-up. On admission, patient tired and weak but normal mental status and fully oriented, vitals remained stable and improved on gentle IVF, work-up with normal lactic acid, wbc. Did not meet SIRs criteria. No empiric antibiotics started. No signs of infection on exam (skin clear, joints nml, lungs clear, CXR neg, anuric). Given persistent joint pain similar to prior lupus flare 1 yr ago, suspect may have SLE flare. Patient was started on 20 mg prednisone for presumed lupus flare and given prn percocet for pain management.  Patient was admitted for pain control and infectious workup for tachycardia and reported fever from ID clinic. Blood culture x1 resulted as positive with gram positive cocci in clusters. For this reason, prednisone was stopped 11/23, and patient was started on IV vancomycin. Viral load was found to be low at <20 on 02/26/15. CD4 count resulted as 70 on 02/26/15. ID was consulted and followed patient's course, believed small joint pain was likely due to lupus flare vs PMR. On 11/25, positive blood culture from 11/22 resulted as growing coag-negative staph, so vancomycin was discontinued, believing result due to contaminant, and prednisone restarted for a week-long course.   Joint pain had improved by discharge on prednisone. Patient refused SNF/CIR and home health that had been recommended due to weakness. Nepro shakes were recommended by nutrition to increase calories.   Issues for Follow Up:  1. Lipitor was held on discharge, as Pt was having myalgias with elevated CK. Can consider restarting as an outpatient if myalgias have improved. 2. Patient should be started on prophylaxis with bactrim for CD4 count of 70. Was not done at discharge. Inquire about next appointment with ID. 3. Rheumatology appointment with [facility] for 05/07/2015 at 9:45, as this was earliest  appointment. May cancel if no longer needed, as  SLE can be managed by PCP.  4. Follow-up appetite on mirtazapine and whether patient has continued any dietary supplements (nepro, prostat).  Significant Procedures: Hemodialysis 02/24/15 and 02/26/15  Significant Labs and Imaging:   Recent Labs Lab 02/25/15 0429 02/26/15 0353 02/27/15 0531  WBC 5.3 5.4 5.1  HGB 9.4* 9.5* 10.4*  HCT 30.3* 30.5* 32.5*  PLT 120* 128* 82*    Recent Labs Lab 02/23/15 1814 02/24/15 0200 02/25/15 0429 02/26/15 0353 02/27/15 0531  NA 134* 134* 136 136 133*  K 4.3 4.0 4.1 4.2 4.4  CL 93* 93* 98* 101 95*  CO2 27 30 27 26 27   GLUCOSE 161* 120* 111* 100* 119*  BUN 46* 48* 36* 64* 47*  CREATININE 7.53* 8.15* 5.20* 7.87* 5.85*  CALCIUM 8.1* 7.9* 8.1* 7.7* 7.7*  PHOS  --   --  3.2 2.9  --   ALKPHOS 37* 36*  --   --   --   AST 58* 50*  --   --   --   ALT 22 22  --   --   --   ALBUMIN 2.3* 2.3* 2.0* 2.0*  --    CK, total - 444 > 438  Lactic Acid 1.7 > 1.2  Blood culture x 2 (11/22) > positive x1 --> coag negative staph  TSH 1.346  Hep B SAg negative  11/22 CXR Portable 1v No acute cardiopulmonary process.  Othopantogram (performed due to jaw pain) negative for any abscess or osseous changes.   Results/Tests Pending at Time of Discharge: None  Discharge Medications:    Medication List    STOP taking these medications        atorvastatin 40 MG tablet  Commonly known as:  LIPITOR      TAKE these medications        abacavir 300 MG tablet  Commonly known as:  ZIAGEN  Take 2 tablets (600 mg total) by mouth daily.     calcium acetate 667 MG capsule  Commonly known as:  PHOSLO  Take 2 capsules (1,334 mg total) by mouth 3 (three) times daily with meals.     dolutegravir 50 MG tablet  Commonly known as:  TIVICAY  Take 1 tablet (50 mg total) by mouth daily.     lamiVUDine 10 MG/ML solution  Commonly known as:  EPIVIR  Take 5 mLs (50 mg total) by mouth daily.     mirtazapine 15 MG tablet  Commonly known as:  REMERON  Take 1  tablet (15 mg total) by mouth at bedtime.     multivitamin with minerals Tabs tablet  Take 1 tablet by mouth daily.     oxyCODONE-acetaminophen 5-325 MG tablet  Commonly known as:  PERCOCET/ROXICET  Take 2 tablets by mouth every 4 (four) hours as needed for severe pain.     predniSONE 20 MG tablet  Commonly known as:  DELTASONE  Take 1 tablet (20 mg total) by mouth daily with breakfast.        Discharge Instructions: Please refer to Patient Instructions section of EMR for full details.  Patient was counseled important signs and symptoms that should prompt return to medical care, changes in medications, dietary instructions, activity restrictions, and follow up appointments.   Follow-Up Appointments: Follow-up Information    Follow up with [facility], DO On 03/03/2015.   Specialty:  Family Medicine   Why:  Hsopital follow-up appointment at 10:45am.   Contact information:  [place] [address] [phone]       Follow up with [facility] On 05/07/2015.   Specialty:  Rheumatology   Why:  Rheumatology appointment at 9:45AM   Contact information:   [place] [address] [phone]       Rogue Bussing, MD 03/01/2015, 10:18 PM PGY-1, [place]

## 2015-02-24 NOTE — Progress Notes (Signed)
FPTS Interim Progress Note  [name] is a 59 yo male presenting with generalized weakness, joint pains and reduced appetite who was admitted for pain control and treatment of suspected acute lupus flare.   S: Patient not complaining of extremity joint pain this morning. However, on exam he did complain of soreness with flexion and extension of fingers. He does have jaw pain with chewing and opening his mouth. Appetite remains poor.  O: BP 124/76 mmHg  Pulse 84  Temp(Src) 98.3 F (36.8 C) (Oral)  Resp 18  Ht 6' 1"  (1.854 m)  Wt 122 lb 4.8 oz (55.475 kg)  BMI 16.14 kg/m2  SpO2 99%   General: Ill-appearing, thin man sitting up in bed, resting HEENT: Mucous membranes moist, poor dentition, ptosis of left eyelid Cardiac: RRR, S1, S2, no m/r/g Pulm: CTAB MSK: No pain with flexion or extension of knees, soreness with flexion and extension of fingers, no swollen joints of hands or feet  (Recent labs ED 11/21 - CRP 19.3, ESR 124) CK, total - 444 > 438 Lactic Acid 1.7 > Blood culture x 2 (11/22) > pending  11/22 CXR Portable 1v No acute cardiopulmonary process.  A/P:  Patient remained afebrile overnight. Highest temperature was 100.1 F. He was not tachycardic. Awaiting blood culture results, but do not expect infectious trigger. Begin steroid treatment for suspected SLE flare. Manage pain. Determine placement.   Joint pain/weakness: - Starting prednisone 20 mg daily - Monitor use of prn pain medications (percocet 1-2 tabs q4h PRN, baclofen PRN) - Holding lipitor as possible source of arthralgias - f/u PT/OT recs - Arrange outpatient rheumatology appointment  ESRD: - Schedule HD for 11/23 - Daily renal function panels  HIV: -Obtain CD4 count and viral load - RNA quant in June was <20, CD4 count 26  Infectious workup: -chest x-ray negative -blood cultures pending -anuric so cannot collect urine -gc/chlamydia probe ordered, obtain sample if concern for infection  increasing -no bedsores noted  Poor appetite: - f/u nutrition recommendations - Boost ordered  Rogue Bussing, MD 02/24/2015, 7:00 AM PGY-1, [facility] pager [phone]

## 2015-02-24 NOTE — Progress Notes (Signed)
OT Cancellation Note  Patient Details Name: Kenneth [name] MRN: [id number] DOB: 26-Oct-1955   Cancelled Treatment:    Reason Eval/Treat Not Completed: Patient at procedure or test/ unavailable (HD) Ot to continue to follow acutely and eval at a more appropriate time.   [name] B 02/24/2015, 11:25 AM   [name]   OTR/L Pager: [phone] Office: [phone] .

## 2015-02-24 NOTE — Procedures (Signed)
Patient was seen on dialysis and the procedure was supervised. BFR 400 Via LUE AVF BP is 124/76.  Patient appears to be tolerating treatment well

## 2015-02-24 NOTE — Progress Notes (Signed)
Initial Nutrition Assessment  DOCUMENTATION CODES:   Severe malnutrition in context of chronic illness, Underweight  INTERVENTION:  Continue Nepro Shake po BID, each supplement provides 425 kcal and 19 grams protein.  Continue 30 ml Prostat po BID, each supplement provides 100 kcal and 15 grams of protein.   Encourage adequate PO intake.   NUTRITION DIAGNOSIS:   Malnutrition related to chronic illness as evidenced by severe depletion of body fat, severe depletion of muscle mass, percent weight loss.  GOAL:   Patient will meet greater than or equal to 90% of their needs  MONITOR:   PO intake, Supplement acceptance, Weight trends, Labs, I & O's  REASON FOR ASSESSMENT:   Consult Poor PO  ASSESSMENT:   59 y.o. male presenting with chronic worsening bilateral multiple arthralgias (hands, wrists, knees, feet) over past >3 weeks (similar prior history), assoc with reduced appetite and generalized weakness / deconditioning. PMH is significant for HIV (well controlled on ART), ESRD (on HD), Lupus with h/o Lupus Nephritis, HTN, protein calorie malnutrition, h/o achalasia (s/p botox by GI).  Pt reports having a decreased appetite which has been ongoing over the past 2-3 months. No meal completion recorded, however pt reports 25% po intake this AM. Pt reports eating poorly PTA with consumption of 1 meal a day on weekdays and 2 meals a day on the weekends. Pt does reports consuming Nepro Shake however is only on occasions. Usual body weight reported to be ~160 lbs. Per Epic weight records, pt with a 27% weight loss in 7 months. Pt currently has Nepro shake and Prostat ordered. RD to continue with current orders. Pt was encouraged to eat his food at meals.   Nutrition-Focused physical exam completed. Findings are severe fat depletion, severe muscle depletion, and no edema.   Labs: Low sodium, calcium, chloride, alkaline phosphatase, GFR. High BUN, creatinine, AST.  Diet Order:  Diet  regular Room service appropriate?: Yes; Fluid consistency:: Thin  Skin:  Reviewed, no issues  Last BM:  Unknown  Height:   Ht Readings from Last 1 Encounters:  02/24/15 6\' 1"  (1.854 m)    Weight:   Wt Readings from Last 1 Encounters:  02/24/15 122 lb 4.8 oz (55.475 kg)    Ideal Body Weight:  83.6 kg  BMI:  Body mass index is 16.14 kg/(m^2).  Estimated Nutritional Needs:   Kcal:  2000-2200  Protein:  90-100 grams  Fluid:  Per MD  EDUCATION NEEDS:   No education needs identified at this time  Kenneth Turman, MS, RD, LDN Pager # [phone] After hours/ weekend pager # [phone]

## 2015-02-24 NOTE — Progress Notes (Signed)
ANTIBIOTIC CONSULT NOTE - INITIAL  Pharmacy Consult for Vancomycin Indication: bacteremia  Allergies  Allergen Reactions  . Amoxicillin Rash and Other (See Comments)    REACTION: diffuse rash    Patient Measurements: Height: 6' 1"  (185.4 cm) Weight: 125 lb 7.1 oz (56.9 kg) IBW/kg (Calculated) : 79.9  Vital Signs: Temp: 97.9 F (36.6 C) (11/23 1045) Temp Source: Oral (11/23 1045) BP: 124/76 mmHg (11/23 1514) Pulse Rate: 77 (11/23 1514)  Labs:  Recent Labs  02/22/15 1741 02/22/15 2227 02/23/15 1814 02/23/15 2015 02/24/15 0200  WBC 6.0 5.4  --  7.8 6.4  HGB 10.9* 10.3*  --  10.3* 9.5*  PLT 162 152  --  157 157  CREATININE 4.41*  --  7.53*  --  8.15*   ESRD  Microbiology: Recent Results (from the past 720 hour(s))  Culture, blood (routine x 2)     Status: None (Preliminary result)   Collection Time: 02/23/15  6:00 PM  Result Value Ref Range Status   Specimen Description BLOOD RIGHT ARM  Final   Special Requests BOTTLES DRAWN AEROBIC AND ANAEROBIC 5CC  Final   Culture  Setup Time   Final    GRAM POSITIVE COCCI IN CLUSTERS AEROBIC BOTTLE ONLY CRITICAL RESULT CALLED TO, READ BACK BY AND VERIFIED WITH: A RIO,RN AT 1435 02/24/15 BY L BENFIELD    Culture GRAM POSITIVE COCCI  Final   Report Status PENDING  Incomplete  Culture, blood (routine x 2)     Status: None (Preliminary result)   Collection Time: 02/23/15  8:00 PM  Result Value Ref Range Status   Specimen Description BLOOD RIGHT HAND  Final   Special Requests BOTTLES DRAWN AEROBIC AND ANAEROBIC 5CC  Final   Culture NO GROWTH < 24 HOURS  Final   Report Status PENDING  Incomplete    Medical History: Past Medical History  Diagnosis Date  . HIV infection ([place]) dx ~ 2009    a. 02/13/2014 CD4 = 240 - undetectable viral load.  . Hyperlipidemia   . Hypertension   . Insomnia   . Dysphagia 2008    post heller myotomy/toupee fundoplication.    . Achalasia   . Renal abscess   . Hemorrhoids, internal   .  Premature ventricular contractions   . Pancytopenia ([place])   . CKD (chronic kidney disease), stage II   . Lupus nephritis ([place])   . SLE (systemic lupus erythematosus) ([place]) dx'd 2015  . Candida infection, esophageal ([place]) 08/2010    a. 2012 - noted on EGD.  Marland Kitchen DVT (deep venous thrombosis) ([place]) ~ 04/2014  . History of blood transfusion 03/2014    "low counts"  . History of stomach ulcers    Assessment:   59 yr old male to begin Vancomycin for bacteremia coverage.  1 of 2 blood cultures with gram + cocci in clusters.   Tmax 100.1, WBC 6.4, mild left shift.  ESR 124, CRP 18.0, LA 1.2.   ESRD - usual MWF dialysis.  Dialyzed today.   HIV - on anti-retrovirals; SLE - on Prednisone 20 mg x 1 given today.  Goal of Therapy:  pre-dialysis Vancomycin levels 15-25 mcg/ml  Plan:   Vancomycin 1250 mg IV x 1 today.  Vancomycin 500 mg IV after HD - MWF - to begin on 02/26/15.  Follow up culture data, progress.  Arty Baumgartner, [place] Pager: [phone] 02/24/2015,4:48 PM

## 2015-02-24 NOTE — Progress Notes (Signed)
Physical Therapy Evaluation   Clinical Impression: Pt admitted with the below complications. Pt currently with functional limitations due to the deficits listed below (see PT Problem List). Demonstrates instability with gait, needing min assist for balance and use of a rolling walker for support this date. Hopeful that patient will progress quickly and improve his functional abilities as his medical status improves however, due to living alone and in his current condition, I recommend ST-SNF for rehabilitation prior to returning home due to fall risk. Pt will benefit from skilled PT to increase their independence and safety with mobility to allow discharge to the venue listed below.        02/24/15 1000  PT Visit Information  Last PT Received On 02/24/15  Assistance Needed +1  History of Present Illness Kenneth [name] is a 59 y.o. male presenting with chronic worsening bilateral multiple arthralgias over past >3 weeks, assoc with reduced appetite and generalized weakness / deconditioning. PMH is significant for HIV, ESRD (on HD), Lupus with h/o Lupus Nephritis, HTN, protein calorie malnutrition, h/o achalasia  Precautions  Precautions Fall  Restrictions  Weight Bearing Restrictions No  Home Living  Family/patient expects to be discharged to: Private residence  Living Arrangements Alone  Available Help at Discharge Friend(s);Available PRN/intermittently  Type of Home Apartment  Home Access Level entry  Home Layout One level  Bathroom Shower/Tub Walk-in Cytogeneticist Yes  Home Equipment Shower seat - built in  Prior Function  Level of Independence Independent  Comments drives; 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  Pain Assessment  Pain Assessment No/denies pain (reports min right lateral thigh pain ambulating)  Cognition  Arousal/Alertness Lethargic  Behavior During Therapy Flat affect  Overall  Cognitive Status Impaired/Different from baseline  Area of Impairment Following commands;Safety/judgement;Problem solving  Following Commands Follows one step commands with increased time  Safety/Judgement Decreased awareness of deficits  Problem Solving Slow processing;Decreased initiation;Difficulty sequencing;Requires verbal cues  Upper Extremity Assessment  Upper Extremity Assessment Defer to OT evaluation  Lower Extremity Assessment  Lower Extremity Assessment Generalized weakness  Bed Mobility  Overal bed mobility Needs Assistance  Bed Mobility Supine to Sit  Supine to sit Min assist;HOB elevated  General bed mobility comments Min assist for truncal support. Cues for technique. Allowed patient to pull through PTs hand to scoot to EOB.  Transfers  Overall transfer level Needs assistance  Equipment used Rolling walker (2 wheeled)  Transfers Sit to/from Stand  Sit to Stand Min assist  General transfer comment Min assist for boost from lowest bed setting. Decreased balance until holding RW appropriately, some difficulty coordinating grasp of RW. VC for hand placement  Ambulation/Gait  Ambulation/Gait assistance Min assist  Ambulation Distance (Feet) 60 Feet  Assistive device Rolling walker (2 wheeled)  Gait Pattern/deviations Step-through pattern;Decreased stride length;Shuffle;Trunk flexed;Narrow base of support  General Gait Details Educated on safe DME use with a rolling walker. VC for upright posture and walker placement with turns. Cues to widen base of support. Slight physical assist posterior loss of balance with progression to close guard assist as distance increased. No buckling occured.  Gait velocity slow  Gait velocity interpretation <1.8 ft/sec, indicative of risk for recurrent falls  Balance  Overall balance assessment Needs assistance  Sitting-balance support No upper extremity supported;Feet supported  Sitting balance-Leahy Scale Good  Standing balance support Single  extremity supported  Standing balance-Leahy Scale Poor  General Comments  General comments (skin integrity,  edema, etc.) diaphoretic  PT - End of Session  Equipment Utilized During Treatment Gait belt  Activity Tolerance Patient tolerated treatment well  Patient left in bed;with call bell/phone within reach;with nursing/sitter in room  Nurse Communication Mobility status  PT Assessment  PT Therapy Diagnosis  Difficulty walking;Abnormality of gait;Generalized weakness;Acute pain  PT Recommendation/Assessment Patient needs continued PT services  PT Problem List Decreased strength;Decreased activity tolerance;Decreased balance;Decreased mobility;Decreased coordination;Decreased knowledge of use of DME;Decreased cognition;Pain  Barriers to Discharge Decreased caregiver support  Barriers to Discharge Comments lives alone  PT Plan  PT Frequency (ACUTE ONLY) Min 3X/week  PT Treatment/Interventions (ACUTE ONLY) DME instruction;Gait training;Functional mobility training;Therapeutic activities;Therapeutic exercise;Balance training;Neuromuscular re-education;Cognitive remediation;Patient/family education  PT Recommendation  Recommendations for Other Services OT consult  Follow Up Recommendations SNF;Supervision/Assistance - 24 hour (Pending progress)  PT equipment (Pending progress)  Individuals Consulted  Consulted and Agree with Results and Recommendations Patient  Acute Rehab PT Goals  Patient Stated Goal Get better before going home  PT Goal Formulation With patient  Time For Goal Achievement 03/10/15  Potential to Achieve Goals Good  PT Time Calculation  PT Start Time (ACUTE ONLY) 1022  PT Stop Time (ACUTE ONLY) 1038  PT Time Calculation (min) (ACUTE ONLY) 16 min  PT General Charges  $$ ACUTE PT VISIT 1 Procedure  PT Evaluation  $Initial PT Evaluation Tier I 1 Procedure  Written Expression  Dominant Hand Right  Elayne Snare, [place]

## 2015-02-24 NOTE — Consult Note (Signed)
[facility] Renal Consultation Note    Indication for Consultation:  Management of ESRD/hemodialysis; anemia, hypertension/volume and secondary hyperparathyroidism PCP: Cordelia Poche, MD  HPI: Kenneth [name] is a 59 y.o. male with ESRD who has hemodialysis MWF at [facility]. Past medical history significant for systemic lupus erythematosus, lupus Nephritis, FSG, s/p R-nephrectomy, HIV, hypertension, dysphagia (heller myotomy/toupee fundoplication, [id number]), achalasia, renal abscess, premature ventricular contractions, DVT, pancytopenia, thrombocytopenia, esophageal candida.   Patient has had approximately 6 week decline in appetite and weight loss, 3 weeks of joint and muscle pain.  Patient presented to Dr. Algis Downs office with history of joint and muscle pain 02/23/2015. He was referred to ED for evaluation of possible sepsis/reaction to dilaudid that he'd received in ED the previous night. Patient was admitted to R/O sepsis however there were no fevers/leukocytosis to suggest infection. Now symptoms are thought to be related to SLE flare/failure to thrive. Patient is known to me from hemodialysis centers. He has experienced a gradual physical decline over the past 6 weeks. He has had C/O loss of appetite and was started on megace to improve appetite. He has continued to lose wt in spite of being started on megace. Was concerned about clinical depression but patient denied depression, declined antidepressants or referral to psychologist. Had low platelet counts and was referred to [name] for evaluation of HIV status.  Currently appears very tired, is being fed by nurse. Denies fever, chills, N,V,D. Still C/O joint and muscle pain. Appears very weak and tired. Patient has HD at [facility] MWF. Does not miss treatments or have early sign offs. Last in center values are as follows: HGB 10.5 (02/17/15) Ca 8.6 C Ca 9.3 Phos 4.0 PTH 457 (11/09/216). Is on calcium acetate binders,  calcitriol for PTH suppression.   Past Medical History  Diagnosis Date  . HIV infection ([place]) dx ~ 2009    a. 02/13/2014 CD4 = 240 - undetectable viral load.  . Hyperlipidemia   . Hypertension   . Insomnia   . Dysphagia 2008    post heller myotomy/toupee fundoplication.    . Achalasia   . Renal abscess   . Hemorrhoids, internal   . Premature ventricular contractions   . Pancytopenia ([place])   . CKD (chronic kidney disease), stage II   . Lupus nephritis ([place])   . SLE (systemic lupus erythematosus) ([place]) dx'd 2015  . Candida infection, esophageal ([place]) 08/2010    a. 2012 - noted on EGD.  Marland Kitchen DVT (deep venous thrombosis) ([place]) ~ 04/2014  . History of blood transfusion 03/2014    "low counts"  . History of stomach ulcers    Past Surgical History  Procedure Laterality Date  . Nephrectomy Right ~ 2011  . Colonoscopy  2008    .  tortuous colon, internal hemorrhoids.  no polyps or diverticulosis.   . Esophagogastroduodenoscopy  08/2010    Dr [name].  08/2010 candida esophagitis, no obvious esophageal stricture. 02/2006 and 05/2006 dilated esophagus, narrowed distal esophagus but no stricture, was empirically Savary dilated  . Heller myotomy  1999    with toupee fundoplication of HH.   Marland Kitchen Esophagogastroduodenoscopy N/A 06/27/2014    Procedure: ESOPHAGOGASTRODUODENOSCOPY (EGD);  Surgeon: [name], MD;  Location: [facility] ENDOSCOPY;  Service: Endoscopy;  Laterality: N/A;  . Bascilic vein transposition Left 06/29/2014    Procedure: FIRST STAGE  [place];  Surgeon: Rosetta Posner, MD;  Location: [place];  Service: Vascular;  Laterality: Left;  . Insertion of dialysis catheter  Right 06/29/2014    Procedure: INSERTION OF DIALYSIS CATHETER RIGHT IJ;  Surgeon: Rosetta Posner, MD;  Location: [facility] OR;  Service: Vascular;  Laterality: Right;  . Esophagogastroduodenoscopy N/A 07/02/2014    Procedure: ESOPHAGOGASTRODUODENOSCOPY (EGD);  Surgeon: [name], MD;  Location: [name] ENDOSCOPY;  Service:  Endoscopy;  Laterality: N/A;  with botox  . Bascilic vein transposition Left 09/11/2014    Procedure: SECOND STAGE BASILIC VEIN TRANSPOSITION LEFT UPPER ARM;  Surgeon: Rosetta Posner, MD;  Location: [facility] OR;  Service: Vascular;  Laterality: Left;   Family History  Problem Relation Age of Onset  . Colon cancer Father     deceased  . Other Mother     s/p pacemaker - alive and well.  . Hypertension Mother   . Other      5 brothers, 3 sisters - alive and well.   Social History:  reports that he has never smoked. He has never used smokeless tobacco. He reports that he does not drink alcohol or use illicit drugs. Allergies  Allergen Reactions  . Amoxicillin Rash and Other (See Comments)    REACTION: diffuse rash   Prior to Admission medications   Medication Sig Start Date End Date Taking? Authorizing Provider  abacavir (ZIAGEN) 300 MG tablet Take 2 tablets (600 mg total) by mouth daily. 11/24/14  Yes Thayer Headings, MD  atorvastatin (LIPITOR) 40 MG tablet Take 1 tablet (40 mg total) by mouth daily. 09/25/14  Yes Mariel Aloe, MD  calcium acetate (PHOSLO) 667 MG capsule Take 2 capsules (1,334 mg total) by mouth 3 (three) times daily with meals. 07/06/14  Yes [place] N Rumley, DO  dolutegravir (TIVICAY) 50 MG tablet Take 1 tablet (50 mg total) by mouth daily. 10/01/14  Yes Campbell Riches, MD  lamiVUDine (EPIVIR) 10 MG/ML solution Take 5 mLs (50 mg total) by mouth daily. 10/01/14  Yes Campbell Riches, MD  mirtazapine (REMERON) 15 MG tablet Take 1 tablet (15 mg total) by mouth at bedtime. 02/18/15  Yes Mariel Aloe, MD  Multiple Vitamin (MULTIVITAMIN WITH MINERALS) TABS tablet Take 1 tablet by mouth daily.   Yes Historical Provider, MD  oxyCODONE-acetaminophen (PERCOCET/ROXICET) 5-325 MG tablet Take 2 tablets by mouth every 4 (four) hours as needed for severe pain. 02/22/15   Charlesetta Shanks, MD   Current Facility-Administered Medications  Medication Dose Route Frequency Provider Last Rate Last  Dose  . abacavir (ZIAGEN) tablet 600 mg  600 mg Oral Daily Olin Hauser, DO      . baclofen (LIORESAL) tablet 5 mg  5 mg Oral TID PRN Olin Hauser, DO      . calcium acetate (PHOSLO) capsule 1,334 mg  1,334 mg Oral TID WC Olin Hauser, DO   1,334 mg at 02/24/15 0857  . dolutegravir (TIVICAY) tablet 50 mg  50 mg Oral Daily Alexander J Karamalegos, DO      . feeding supplement (BOOST / RESOURCE BREEZE) liquid 1 Container  1 Container Oral TID BM Alexander Devin Going, DO      . feeding supplement (ENSURE ENLIVE) (ENSURE ENLIVE) liquid 237 mL  237 mL Oral BID BM Leeanne Rio, MD      . heparin injection 5,000 Units  5,000 Units Subcutaneous 3 times per day Olin Hauser, DO      . lamiVUDine (EPIVIR) 10 MG/ML solution 50 mg  50 mg Oral Daily Olin Hauser, DO      . mirtazapine (REMERON) tablet 15 mg  15 mg Oral QHS Olin Hauser, DO   15 mg at 02/24/15 0112  . ondansetron (ZOFRAN) tablet 4 mg  4 mg Oral Q6H PRN Olin Hauser, DO       Or  . ondansetron [facility]) injection 4 mg  4 mg Intravenous Q6H PRN Olin Hauser, DO      . oxyCODONE-acetaminophen (PERCOCET/ROXICET) 5-325 MG per tablet 1-2 tablet  1-2 tablet Oral Q4H PRN Olin Hauser, DO   1 tablet at 02/24/15 0112  . [START ON 02/25/2015] pneumococcal 23 valent vaccine (PNU-IMMUNE) injection 0.5 mL  0.5 mL Intramuscular Tomorrow-1000 Leeanne Rio, MD      . predniSONE (DELTASONE) tablet 20 mg  20 mg Oral Q breakfast Olin Hauser, DO   20 mg at 02/24/15 0111  . senna-docusate (Senokot-S) tablet 1 tablet  1 tablet Oral QHS Devonne Doughty Karamalegos, DO      . sodium chloride 0.9 % injection 3 mL  3 mL Intravenous Q12H Olin Hauser, DO   3 mL at 02/24/15 0100   Labs: Basic Metabolic Panel:  Recent Labs Lab 02/22/15 1741 02/23/15 1814 02/24/15 0200  NA 136 134* 134*  K 3.6 4.3 4.0  CL 94* 93* 93*  CO2 27 27 30    GLUCOSE 110* 161* 120*  BUN 19 46* 48*  CREATININE 4.41* 7.53* 8.15*  CALCIUM 7.9* 8.1* 7.9*   Liver Function Tests:  Recent Labs Lab 02/23/15 1814 02/24/15 0200  AST 58* 50*  ALT 22 22  ALKPHOS 37* 36*  BILITOT 0.8 0.3  PROT 7.4 7.3  ALBUMIN 2.3* 2.3*   No results for input(s): LIPASE, AMYLASE in the last 168 hours. No results for input(s): AMMONIA in the last 168 hours. CBC:  Recent Labs Lab 02/22/15 1741 02/22/15 2227 02/23/15 2015 02/24/15 0200  WBC 6.0 5.4 7.8 6.4  NEUTROABS  --  4.3 6.7 5.2  HGB 10.9* 10.3* 10.3* 9.5*  HCT 34.2* 31.9* 31.7* 29.9*  MCV 95.3 96.1 95.8 95.2  PLT 162 152 157 157   Cardiac Enzymes:  Recent Labs Lab 02/23/15 2349 02/24/15 0200  CKTOTAL 444* 438*   CBG: No results for input(s): GLUCAP in the last 168 hours. Iron Studies: No results for input(s): IRON, TIBC, TRANSFERRIN, FERRITIN in the last 72 hours. Studies/Results: Dg Chest 2 View  02/23/2015  CLINICAL DATA:  Patient weakness for 2 weeks EXAM: CHEST  2 VIEW COMPARISON:  Chest radiograph 07/03/2014. FINDINGS: Normal cardiac and mediastinal contours. No consolidative pulmonary opacities. No pleural effusion or pneumothorax. Upper abdominal surgical clips. Thoracic spine degenerative changes. Rightward curvature of the thoracic spine. IMPRESSION: No acute cardiopulmonary process. Electronically Signed   By: Lovey Newcomer M.D.   On: 02/23/2015 18:28    ROS: As per HPI otherwise negative.   Physical Exam: Filed Vitals:   02/24/15 0543 02/24/15 1045 02/24/15 1055 02/24/15 1125  BP: 124/76 119/77    Pulse: 84 79 76 83  Temp: 98.3 F (36.8 C) 97.9 F (36.6 C)    TempSrc: Oral Oral    Resp: 18 14 13 13   Height:      Weight:      SpO2: 99% 99% 99%      General: chronically ill appearing, cachectic AAM in NAD.  Head: Normocephalic, atraumatic, sclera non-icteric, mucus membranes are moist Neck: Supple. JVD not elevated. Lungs: Clear bilaterally to auscultation without  wheezes, rales, or rhonchi. Breathing is unlabored. Heart: RRR with S1 S2. No murmurs, rubs, or gallops appreciated. Abdomen:  Soft, non-tender, non-distended with normoactive bowel sounds. No rebound/guarding. No obvious abdominal masses. Lower extremities:without edema or ischemic changes, no open wounds  Neuro: Alert and oriented X 3. Moves all extremities spontaneously. Psych:  Responds to questions appropriately with a flat affect. Dialysis Access: LUA AVF + bruit  Dialysis Orders: Center: [facility] on MWF . 4 hrs 0 min, 180NRe Optiflux, BFR 400, DFR Manual 800 mL/min, EDW 60 (kg), Dialysate 2.0 K, 2.0 Ca, UFR Profile: None, Sodium Model: None, Access: LUA AV Fistula Heparin: 2000 units per treatment Mircera 100 mcg IV q 2 weeks (Last dose 02/17/2015) Calcitriol 1.0 mcg PO MWF (last dose 02/22/2015) Assessment/Plan: 1. Generalized Weakness w/ Arthralgias, b/l, symmetrical, sm joints - suspected mild-mod SLE flare: Per Primary. Infectious disease following. Was recently referred to Dr. Algis Downs office from HD center.  2. ESRD -  MWF at [facility] Will have HD today. 3. Hypertension/volume  - No OP antihypertensive meds. BP stable at present. Will run even today. Is 5 kgs below OP EDW.  4. Anemia  - Hgb 9.5 Last dose of Mircera 100 mcg IV 02/17/15. Follow CBC.  5. Metabolic bone disease -  Ca 7.9 C Ca 9.26 Last incenter PTH 457 (02/10/15). Continue binders, calcitriol.  6. Nutrition - Albumin 2.3. Very poor appetite. Will liberalize diet, regular diet, add prostat. DC boost, start nepro. 7. HIV: Per primary/Infectious disease. 8. Elevated CPK's possibly related to medications or myositis.  Await ID input.   Rita H. Owens Shark, NP-C 02/24/2015, 1:00 PM  [employer] [phone]  I have seen and examined this patient and agree with plan as outlined by Juanell Fairly, NP. Donato Heinz A,MD 02/24/2015 2:43 PM

## 2015-02-25 ENCOUNTER — Inpatient Hospital Stay (HOSPITAL_COMMUNITY): Payer: BC Managed Care – PPO

## 2015-02-25 DIAGNOSIS — R7881 Bacteremia: Secondary | ICD-10-CM | POA: Insufficient documentation

## 2015-02-25 DIAGNOSIS — B20 Human immunodeficiency virus [HIV] disease: Secondary | ICD-10-CM

## 2015-02-25 DIAGNOSIS — E43 Unspecified severe protein-calorie malnutrition: Secondary | ICD-10-CM

## 2015-02-25 DIAGNOSIS — R634 Abnormal weight loss: Secondary | ICD-10-CM

## 2015-02-25 DIAGNOSIS — N186 End stage renal disease: Secondary | ICD-10-CM

## 2015-02-25 DIAGNOSIS — M2550 Pain in unspecified joint: Secondary | ICD-10-CM

## 2015-02-25 LAB — RENAL FUNCTION PANEL
Albumin: 2 g/dL — ABNORMAL LOW (ref 3.5–5.0)
Anion gap: 11 (ref 5–15)
BUN: 36 mg/dL — AB (ref 6–20)
CHLORIDE: 98 mmol/L — AB (ref 101–111)
CO2: 27 mmol/L (ref 22–32)
Calcium: 8.1 mg/dL — ABNORMAL LOW (ref 8.9–10.3)
Creatinine, Ser: 5.2 mg/dL — ABNORMAL HIGH (ref 0.61–1.24)
GFR, EST AFRICAN AMERICAN: 13 mL/min — AB (ref >60)
GFR, EST NON AFRICAN AMERICAN: 11 mL/min — AB (ref >60)
Glucose, Bld: 111 mg/dL — ABNORMAL HIGH (ref 65–99)
POTASSIUM: 4.1 mmol/L (ref 3.5–5.1)
Phosphorus: 3.2 mg/dL (ref 2.5–4.6)
Sodium: 136 mmol/L (ref 135–145)

## 2015-02-25 LAB — CBC WITH DIFFERENTIAL/PLATELET
BASOS PCT: 0 %
Basophils Absolute: 0 10*3/uL (ref 0.0–0.1)
EOS ABS: 0 10*3/uL (ref 0.0–0.7)
EOS PCT: 0 %
HEMATOCRIT: 30.3 % — AB (ref 39.0–52.0)
Hemoglobin: 9.4 g/dL — ABNORMAL LOW (ref 13.0–17.0)
Lymphocytes Relative: 13 %
Lymphs Abs: 0.7 10*3/uL (ref 0.7–4.0)
MCH: 29.9 pg (ref 26.0–34.0)
MCHC: 31 g/dL (ref 30.0–36.0)
MCV: 96.5 fL (ref 78.0–100.0)
MONO ABS: 0.3 10*3/uL (ref 0.1–1.0)
MONOS PCT: 5 %
Neutro Abs: 4.3 10*3/uL (ref 1.7–7.7)
Neutrophils Relative %: 82 %
Platelets: 120 10*3/uL — ABNORMAL LOW (ref 150–400)
RBC: 3.14 MIL/uL — ABNORMAL LOW (ref 4.22–5.81)
RDW: 15.4 % (ref 11.5–15.5)
WBC: 5.3 10*3/uL (ref 4.0–10.5)

## 2015-02-25 NOTE — Progress Notes (Signed)
Patient ID: Kenneth [name], male   DOB: 1955-05-16, 59 y.o.   MRN: [id number]  [facility] Progress Note    Subjective:   Feeling a little better today   Objective:   BP 135/66 mmHg  Pulse 81  Temp(Src) 99.8 F (37.7 C) (Oral)  Resp 16  Ht 6\' 1"  (1.854 m)  Wt 54.4 kg (119 lb 14.9 oz)  BMI 15.83 kg/m2  SpO2 99%  Intake/Output: I/O last 3 completed shifts: In: 625 [P.O.:120; I.V.:505] Out: 100 [Urine:100]   Intake/Output this shift:    Weight change: 1.025 kg (2 lb 4.2 oz)  Physical Exam: Gen:WD cachectic AAM with bitemporal wasting CVS:no rub Resp:cta [id number] Ext: no edema, L AVF +T/B  Labs: BMET  Recent Labs Lab 02/22/15 1741 02/23/15 1814 02/24/15 0200 02/25/15 0429  NA 136 134* 134* 136  K 3.6 4.3 4.0 4.1  CL 94* 93* 93* 98*  CO2 27 27 30 27   GLUCOSE 110* 161* 120* 111*  BUN 19 46* 48* 36*  CREATININE 4.41* 7.53* 8.15* 5.20*  ALBUMIN  --  2.3* 2.3* 2.0*  CALCIUM 7.9* 8.1* 7.9* 8.1*  PHOS  --   --   --  3.2   CBC  Recent Labs Lab 02/22/15 2227 02/23/15 2015 02/24/15 0200 02/25/15 0429  WBC 5.4 7.8 6.4 5.3  NEUTROABS 4.3 6.7 5.2 4.3  HGB 10.3* 10.3* 9.5* 9.4*  HCT 31.9* 31.7* 29.9* 30.3*  MCV 96.1 95.8 95.2 96.5  PLT 152 157 157 120*    @IMGRELPRIORS @ Medications:    . abacavir  600 mg Oral Daily  . [START ON 02/26/2015] calcitRIOL  1 mcg Oral Q M,W,F-HD  . calcium acetate  1,334 mg Oral TID WC  . dolutegravir  50 mg Oral Daily  . feeding supplement (NEPRO CARB STEADY)  237 mL Oral BID BM  . feeding supplement (PRO-STAT SUGAR FREE 64)  30 mL Oral BID  . heparin  5,000 Units Subcutaneous 3 times per day  . lamiVUDine  50 mg Oral Daily  . mirtazapine  15 mg Oral QHS  . multivitamin  1 tablet Oral QHS  . pneumococcal 23 valent vaccine  0.5 mL Intramuscular Tomorrow-1000  . senna-docusate  1 tablet Oral QHS  . sodium chloride  3 mL Intravenous Q12H  . [START ON 02/26/2015] vancomycin  500 mg Intravenous Q M,W,F-HD    Dialysis Access: LUA AVF + bruit  Dialysis Orders: Center: [place] on MWF . 4 hrs 0 min, 180NRe Optiflux, BFR 400, DFR Manual 800 mL/min, EDW 60 (kg), Dialysate 2.0 K, 2.0 Ca, UFR Profile: None, Sodium Model: None, Access: LUA AV Fistula Heparin: 2000 units per treatment Mircera 100 mcg IV q 2 weeks (Last dose 02/17/2015) Calcitriol 1.0 mcg PO MWF (last dose 02/22/2015)  Assessment/ Plan:    Assessment/Plan: 1. Generalized Weakness w/ Arthralgias, b/l, symmetrical, sm joints - suspected mild-mod SLE flare: Per Primary. Infectious disease following. Was recently referred to Dr. Algis Downs office from HD center.  2. ESRD - MWF at [facility]. Will have HD again on tomorrow and stay on schedule. 3. Hypertension/volume - No OP antihypertensive meds. BP stable at present. Will run even today. Is 5 kgs below OP EDW.  4. Anemia - Hgb 9.5 Last dose of Mircera 100 mcg IV 02/17/15. Follow CBC.  5. Metabolic bone disease - Ca 7.9 C Ca 9.26 Last incenter PTH 457 (02/10/15). Continue binders, calcitriol.  6. Nutrition - Albumin 2.3. Very poor appetite. Will liberalize diet, regular diet, add prostat. DC  boost, start nepro. 7. HIV: Per primary/Infectious disease. 8. Elevated CPK's- possibly related to medications or myositis. Await ID input.  After further questioning, Mr. Hutton reports that he has been taking his statin therapy at home despite being held by his Nephrologist so this may also explain the myalgias and mildly elevated CPK's.  Continue to hold atorvastatin and await ID input. 9. Caloric and protein malnutrition- pt has bitemporal wasting and is 6kg below his EDW.  Now with FTT, await ID input regarding prognosis and potential etiology  Donetta Potts, MD [place] Pager [phone] 02/25/2015, 11:08 AM

## 2015-02-25 NOTE — Progress Notes (Signed)
Family Medicine Teaching Service Daily Progress Note Intern Pager: [phone]  Patient name: Kenneth [name] Medical record number: [id number] Date of birth: 12-06-1955 Age: 59 y.o. Gender: male  Primary Care Provider: Cordelia Poche, MD Consultants: Nephrology, ID Code Status: Full  Pt Overview and Major Events to Date:  - 11/23: patient received dialysis; blood culture drawn 11/22 resulted with gram positive cocci in clusters; patient started on IV vancomycin  Assessment and Plan: MALKIEL [name] is a 59 y.o. male presenting with chronic worsening bilateral multiple arthralgias (hands, wrists, knees, feet) over past >3 weeks (similar prior history), assoc with reduced appetite and generalized weakness / deconditioning. PMH is significant for HIV (well controlled on ART), ESRD (on HD), Lupus with h/o Lupus Nephritis, HTN, protein calorie malnutrition, h/o achalasia (s/p botox by GI).  # Generalized Weakness w/ Arthralgias, b/l, symmetrical, sm joints - suspected mild-mod SLE flare Not on chronic treatment for SLE. No evidence of joint effusion or infection on admission. Presented with low-grade temp on admit to 100.24F without outside fevers/chills. Does not meet SIRs criteria (only 1/4 with mild tachy mid 90s on arrival, since resolved, WBC nml, no O2, temp < 100.4, and RR nml), patient sent to ED from ID for tachycardia (HR 119) and concern decreased aletness, and initial concern was for possible infection. No source clinically available. CXR clear, skin clear (no decubitus ulcer), anuric. On admit, tired with improved alertness and fully oriented. Considered possible rhabdo with generalized pains and relative immobility recently. Blood culture x 1 from admission resulted with gram positive cocci. Patient remained afebrile overnight.  - Lactic acid protocol > 1.7 (normal) > 1.2 - Trend recent CRP 19.3 (11/21) > 18.0 - CK, total 444 (elevated, but not consistent with rhabdo) > down to 438 - Follow  blood cultures x 2 in ED, no urine culture (anuric); one resulted with gram positive cocci in less than 24 hours.  - Pain management with Percocet 1-2 tabs q 4 hr PRN pain (has not taken in over 24 hours) - Baclofen PRN, Kenneth pad  - Started 20 mg prednisone 11/23 but discontinued with bacteremia. - PT/OT eval for placement --> PT recommending SNF but patient wants to go home - Made rheumatology appointment with [facility] for 05/07/2015 at 9:45, as this was earliest appointment. May cancel if no longer needed.   # Bacteremia - Follow blood cultures and speciation of positive culture from 11/22 - ID consulted, as they follow patient in the outpatient setting - Continue vancomycin started per pharmacy consult - Awaiting CD4 count, viral load  # Jaw pain: Patient with poor dentition. Had right upper molar removed a couple of weeks ago. - Orthopantogram negative for abscess or osseous abnormalities  # Reduced Appetite, sever calorie malnutrition, poor PO with mild dehydration, likely secondary to above pain and flare as well as CKD Suspected secondary to primary problem of joint pain with likely lupus flare. Seems to be limited by joint pain and not any GI red flags. No abdominal pain. Without nausea, vomiting. Elevated BUN to 48 (previously lower) could support dehydration. Weight loss about 5 lbs in 1 week, and about 20 lb in 5 months. Poor appetite and reduced PO. - BUN 36  - Continue Mirtazapine 15mg  at night  - Zofran PRN nausea - May be underlying depression per PCP - Consulted nutrition --> add Nepro Shake BID and continue 30 ml prostat BID  # ESRD, on HD (M-W-F), in setting of prolif Lupus Nephritis, FSG, solitary  kidney s/p R-nephrectomy Last HD on Monday 11/21. No missed treatments recently. Followed by [facility]. Diagnosed ESRD in 06/2014 subsequent access with L-arm AVF. Patient is anuric. - Daily wt, strict I/O - Nephrology following. Receives dialysis MWF. Had dialysis  11/23. Will need dialysis 11/25.  - Check daily renal function panel - continue home phoslo  # Anemia, chronic, in setting of ESRD Hgb on admit 10.3, stable. Seems improved from previous baseline data in 07/2014, however this may be new baseline after prior iron and ESA treatments. May be hemoconcentrated but will trend CBC. - Follow CBC --> stable at 9.4 - Nephrology following, last iron studies 06/2014. Consider further therapy.  # HIV, well controlled on ART: RNA quant in 10/01/14 was <20, CD4 count 26 Followed by ID (Dr [name]), last seen on 02/23/15. - Continue home Dolutegravir, Abacavir, Lamivudine - Obtain CD4 count and viral load  # HTN - Stable Normal range on admit without hypotension. Currently not on any anti-HTN meds - Monitor  # HLD Hold lipitor on admit given complaints of joint pain.  # Constipation, chronic Last BM 1 week, with poor PO likely. - Start bowel regimen 1 tab senna-docusate at night scheduled while on narcotics. Can increase to 2 or add miralax PRN.  FEN/GI: Heart healthy diet Prophylaxis: Heparin SQ (no lovenox with ESRD)  Disposition: Proceed with HD as scheduled. Disposition unclear, PT recommending ST-SNF. Patient amenable to home with [facility] but not SNF placement.  Subjective:  Patient still with jaw pain this morning, but slightly improved since yesterday. He was able to eat some dinner and breakfast despite decreased appetite.   Objective: Temp:  [98 F (36.7 C)-100.2 F (37.9 C)] 99.8 F (37.7 C) (11/24 1039) Pulse Rate:  [74-101] 81 (11/24 1039) Resp:  [12-23] 16 (11/24 1039) BP: (97-135)/(42-76) 135/66 mmHg (11/24 1039) SpO2:  [98 %-99 %] 99 % (11/24 1039) Weight:  [119 lb 14.9 oz (54.4 kg)-125 lb 7.1 oz (56.9 kg)] 119 lb 14.9 oz (54.4 kg) (11/23 2033) Physical Exam: General: Cachetic man, sitting awake in bed HEENT; Ptosis of left eye. MMM. Poor dentition. No TTP of jaw externally. No lymphadenopathy. Cardiovascular: RRR, S1, S2, no  m/r/g Respiratory: CTAB Abdomen: +BS, soft, NTND Extremities: No LE edema, no erythema or swelling of joints. Still has "soreness" with flexion and extension of fingers.   Laboratory:  Recent Labs Lab 02/23/15 2015 02/24/15 0200 02/25/15 0429  WBC 7.8 6.4 5.3  HGB 10.3* 9.5* 9.4*  HCT 31.7* 29.9* 30.3*  PLT 157 157 120*    Recent Labs Lab 02/23/15 1814 02/24/15 0200 02/25/15 0429  NA 134* 134* 136  Kenneth 4.3 4.0 4.1  CL 93* 93* 98*  CO2 27 30 27   BUN 46* 48* 36*  CREATININE 7.53* 8.15* 5.20*  CALCIUM 8.1* 7.9* 8.1*  PROT 7.4 7.3  --   BILITOT 0.8 0.3  --   ALKPHOS 37* 36*  --   ALT 22 22  --   AST 58* 50*  --   GLUCOSE 161* 120* 111*    Imaging/Diagnostic Tests: DG orthopantogram negative for periapical abscess or osseous abnormality  Rogue Bussing, MD 02/25/2015, 11:13 AM PGY-1, [place] Intern pager: [phone], text pages welcome

## 2015-02-25 NOTE — Progress Notes (Signed)
[facility] for Infectious Disease    Date of Admission:  02/23/2015   Total days of antibiotics 2        Day 2 vanco           ID: Kenneth [name] is a 59 y.o. male with well controlled HIV disease, ESRD admitted for myalgia, arthralgias, feeling poorly x 3 wk Active Problems:   HIV disease ([place])   Essential hypertension   Arthralgia of multiple sites, bilateral   Protein-calorie malnutrition, severe (HCC)   Lupus nephritis ([place])   ESRD (end stage renal disease) ([place])   Unintentional weight loss   Weakness   Exacerbation of systemic lupus (HCC)   Subjective: Remains afebrile. He reports that he notices difficulty climbing stairs, occasionally difficulty getting up from toilet, but also remarks that his hands/fingers get sore. His lost of appetite roughly is 4-6 wk where he eats mostly 1 meal per day and 2 meals on wkd.  Interval hx: started on vancomycin due to [facility] in 1 of 2 sets,   Review of Systems  Constitutional: + loss of appetite, and unintentional weight loss. Negative for fever, chills, diaphoresis, activity change,  HENT: Negative for congestion, sore throat, rhinorrhea, sneezing, trouble swallowing and sinus pressure.  Eyes: Negative for photophobia and visual disturbance.  Respiratory: Negative for cough, chest tightness, shortness of breath, wheezing and stridor.  Cardiovascular: Negative for chest pain, palpitations and leg swelling.  Gastrointestinal: Negative for nausea, vomiting, abdominal pain, diarrhea, constipation, blood in stool, abdominal distention and anal bleeding.  Genitourinary: Negative for dysuria, hematuria, flank pain and difficulty urinating.  Musculoskeletal: Negative for myalgias, back pain, joint swelling, arthralgias and gait problem.  Skin: Negative for color change, pallor, rash and wound.  Neurological: Negative for dizziness, tremors, weakness and light-headedness.  Hematological: Negative for adenopathy. Does not bruise/bleed  easily.  Psychiatric/Behavioral: Negative for behavioral problems, confusion, sleep disturbance, dysphoric mood, decreased concentration and agitation.     Medications:  . abacavir  600 mg Oral Daily  . [START ON 02/26/2015] calcitRIOL  1 mcg Oral Q M,W,F-HD  . calcium acetate  1,334 mg Oral TID WC  . dolutegravir  50 mg Oral Daily  . feeding supplement (NEPRO CARB STEADY)  237 mL Oral BID BM  . feeding supplement (PRO-STAT SUGAR FREE 64)  30 mL Oral BID  . heparin  5,000 Units Subcutaneous 3 times per day  . lamiVUDine  50 mg Oral Daily  . mirtazapine  15 mg Oral QHS  . multivitamin  1 tablet Oral QHS  . pneumococcal 23 valent vaccine  0.5 mL Intramuscular Tomorrow-1000  . senna-docusate  1 tablet Oral QHS  . sodium chloride  3 mL Intravenous Q12H  . [START ON 02/26/2015] vancomycin  500 mg Intravenous Q M,W,F-HD    Objective: Vital signs in last 24 hours: Temp:  [97.9 F (36.6 C)-100.2 F (37.9 C)] 99.8 F (37.7 C) (11/24 1039) Pulse Rate:  [74-101] 81 (11/24 1039) Resp:  [12-23] 16 (11/24 1039) BP: (97-135)/(42-77) 135/66 mmHg (11/24 1039) SpO2:  [98 %-99 %] 99 % (11/24 1039) Weight:  [119 lb 14.9 oz (54.4 kg)-125 lb 7.1 oz (56.9 kg)] 119 lb 14.9 oz (54.4 kg) (11/23 2033)  Physical Exam  Constitutional: He is oriented to person, place, and time. He appears well-developed and well-nourished. No distress. Thin frame HENT:  Mouth/Throat: Oropharynx is clear and moist. No oropharyngeal exudate.  Cardiovascular: Normal rate, regular rhythm and normal heart sounds. Exam reveals no gallop and no  friction rub.  No murmur heard.  Pulmonary/Chest: Effort normal and breath sounds normal. No respiratory distress. He has no wheezes.  Chest: ribs and clavicle quite prominent Abdominal: Soft. Bowel sounds are normal. He exhibits no distension. There is no tenderness.  Lymphadenopathy:  He has no cervical adenopathy.  Neurological: He is alert and oriented to person, place, and time.  Moving all extremities without weakness, mild pain Skin: Skin is warm and dry. No rash noted. No erythema.  Psychiatric: He has a normal mood and affect. His behavior is normal.  Ext: no effusion to large joints    Lab Results  Recent Labs  02/24/15 0200 02/25/15 0429  WBC 6.4 5.3  HGB 9.5* 9.4*  HCT 29.9* 30.3*  NA 134* 136  K 4.0 4.1  CL 93* 98*  CO2 30 27  BUN 48* 36*  CREATININE 8.15* 5.20*   Liver Panel  Recent Labs  02/23/15 1814 02/24/15 0200 02/25/15 0429  PROT 7.4 7.3  --   ALBUMIN 2.3* 2.3* 2.0*  AST 58* 50*  --   ALT 22 22  --   ALKPHOS 37* 36*  --   BILITOT 0.8 0.3  --    Sedimentation Rate  Recent Labs  02/22/15 2227  ESRSEDRATE 124*   C-Reactive Protein  Recent Labs  02/22/15 2227 02/24/15 0200  CRP 19.3* 18.0*    Microbiology: 11/22 blood cx 1 of 2 sets [facility] Studies/Results: Dg Orthopantogram  02/25/2015  CLINICAL DATA:  Generalized jaw pain, being treated for malnutrition. Dialysis patient with decreased appetite, weight loss, weakness. EXAM: ORTHOPANTOGRAM/PANORAMIC COMPARISON:  None. FINDINGS: Numerous missing maxillary and mandibular teeth. No convincing evidence of a periapical dental abscess. No acute- appearing cortical irregularity or osseous lesion seen. No destructive changes to suggest osteomyelitis. No fracture line or displaced fracture fragment. IMPRESSION: Unremarkable plain film examination of the mandible. No acute findings. Electronically Signed   By: Franki Cabot M.D.   On: 02/25/2015 08:20   Dg Chest 2 View  02/23/2015  CLINICAL DATA:  Patient weakness for 2 weeks EXAM: CHEST  2 VIEW COMPARISON:  Chest radiograph 07/03/2014. FINDINGS: Normal cardiac and mediastinal contours. No consolidative pulmonary opacities. No pleural effusion or pneumothorax. Upper abdominal surgical clips. Thoracic spine degenerative changes. Rightward curvature of the thoracic spine. IMPRESSION: No acute cardiopulmonary process. Electronically  Signed   By: Lovey Newcomer M.D.   On: 02/23/2015 18:28     Assessment/Plan: 59yo M with well controlled HIV disease, CD 4 count of 400/VL<20 on ESRD having diffuse arthralgia, myalgias x 3. No leukocytosis or fever. Also found to have elevated inflammatory markers with clinical presentation of small joint involvement  Reactive athritis: differential could include lupus flare of arthritis, vs. PMR - agree with primary team that this could be lupus flare of arthritis. Mildly elevated CK would also be consistent with inflammatory myopathy. I would agree with doing a trial of steroids  - GPCC in 1 of 2 sets of blood culture have not yet been finalized, but speaking with the micro lab, it has features c/w Coagulase Negative Staph species, which could possibly a contaminant- for now he is covered on vancomycin  -  HIV disease - continue with his current regimen. Await VL. No need to change regimen empirically  - ESRD, continue on current schedule for HD  Anorexia/poor nutritional intake/moderate protein caloric malnutrition = difficult to tell how much he is eating. Recommend nutrition consult and calorie count to see how much he is taking in, may  need appetite stimulant Tabria Steines, [facility] for Infectious Diseases Cell: [phone] Pager: [phone]  02/25/2015, 10:42 AM

## 2015-02-26 DIAGNOSIS — M329 Systemic lupus erythematosus, unspecified: Secondary | ICD-10-CM

## 2015-02-26 LAB — RENAL FUNCTION PANEL
ANION GAP: 9 (ref 5–15)
Albumin: 2 g/dL — ABNORMAL LOW (ref 3.5–5.0)
BUN: 64 mg/dL — AB (ref 6–20)
CALCIUM: 7.7 mg/dL — AB (ref 8.9–10.3)
CO2: 26 mmol/L (ref 22–32)
Chloride: 101 mmol/L (ref 101–111)
Creatinine, Ser: 7.87 mg/dL — ABNORMAL HIGH (ref 0.61–1.24)
GFR calc Af Amer: 8 mL/min — ABNORMAL LOW (ref >60)
GFR calc non Af Amer: 7 mL/min — ABNORMAL LOW (ref >60)
GLUCOSE: 100 mg/dL — AB (ref 65–99)
Phosphorus: 2.9 mg/dL (ref 2.5–4.6)
Potassium: 4.2 mmol/L (ref 3.5–5.1)
SODIUM: 136 mmol/L (ref 135–145)

## 2015-02-26 LAB — CBC WITH DIFFERENTIAL/PLATELET
BASOS PCT: 0 %
Basophils Absolute: 0 10*3/uL (ref 0.0–0.1)
EOS ABS: 0 10*3/uL (ref 0.0–0.7)
EOS PCT: 0 %
HCT: 30.5 % — ABNORMAL LOW (ref 39.0–52.0)
HEMOGLOBIN: 9.5 g/dL — AB (ref 13.0–17.0)
LYMPHS ABS: 0.7 10*3/uL (ref 0.7–4.0)
Lymphocytes Relative: 12 %
MCH: 29.8 pg (ref 26.0–34.0)
MCHC: 31.1 g/dL (ref 30.0–36.0)
MCV: 95.6 fL (ref 78.0–100.0)
MONO ABS: 0.3 10*3/uL (ref 0.1–1.0)
MONOS PCT: 6 %
Neutro Abs: 4.4 10*3/uL (ref 1.7–7.7)
Neutrophils Relative %: 82 %
Platelets: 128 10*3/uL — ABNORMAL LOW (ref 150–400)
RBC: 3.19 MIL/uL — ABNORMAL LOW (ref 4.22–5.81)
RDW: 15.4 % (ref 11.5–15.5)
WBC: 5.4 10*3/uL (ref 4.0–10.5)

## 2015-02-26 LAB — CULTURE, BLOOD (ROUTINE X 2)

## 2015-02-26 LAB — HIV-1 RNA QUANT-NO REFLEX-BLD: LOG10 HIV-1 RNA: UNDETERMINED log10copy/mL

## 2015-02-26 LAB — T-HELPER CELLS (CD4) COUNT (NOT AT ARMC)
CD4 % Helper T Cell: 21 % — ABNORMAL LOW (ref 33–55)
CD4 T Cell Abs: 70 /uL — ABNORMAL LOW (ref 400–2700)

## 2015-02-26 MED ORDER — HEPARIN SODIUM (PORCINE) 1000 UNIT/ML DIALYSIS: 20.0000 [IU]/kg | INTRAMUSCULAR | Status: DC | PRN

## 2015-02-26 MED ORDER — PREDNISONE 20 MG PO TABS
20.0000 mg | ORAL_TABLET | Freq: Every day | ORAL | Status: DC
  Administered 2015-02-26 – 2015-02-27 (×2): 20 mg via ORAL
  Filled 2015-02-26 (×2): qty 1

## 2015-02-26 MED ORDER — ACETAMINOPHEN 325 MG PO TABS
ORAL_TABLET | ORAL | Status: AC
  Filled 2015-02-26: qty 2

## 2015-02-26 MED ORDER — ACETAMINOPHEN 325 MG PO TABS
650.0000 mg | ORAL_TABLET | Freq: Four times a day (QID) | ORAL | Status: DC | PRN
  Administered 2015-02-26 (×2): 650 mg via ORAL

## 2015-02-26 NOTE — Progress Notes (Signed)
Interim Progress Note:  Spoke with ID to update on resulted labs, CD4 count (70) and viral load (<20). Stated that in acute settings such as a hospitalization, CD4 can be falsely low. Viral load is a better marker of control of infection. ID recommended holding off starting prophylactic antibiotics for now. Appreciate reccs.   Smiley Houseman, MD PGY-1

## 2015-02-26 NOTE — Progress Notes (Signed)
OT Cancellation Note  Patient Details Name: Kenneth [name] MRN: [id number] DOB: 01/06/1956   Cancelled Treatment:    Reason Eval/Treat Not Completed: Patient at procedure or test/ unavailable. Pt in hemodialysis.  Almon Register [id number] 02/26/2015, 11:40 AM

## 2015-02-26 NOTE — Procedures (Signed)
Patient was seen on dialysis and the procedure was supervised. BFR 400 Via LUE AVF BP is 129/76.  Patient appears to be tolerating treatment well

## 2015-02-26 NOTE — Progress Notes (Signed)
Family Medicine Teaching Service Daily Progress Note Intern Pager: [phone]  Patient name: Kenneth [name] Medical record number: [id number] Date of birth: [birth year] Age: 59 y.o. Gender: male  Primary Care Provider: Cordelia Poche, MD Consultants: Nephrology, ID Code Status: Full  Pt Overview and Major Events to Date:  - 11/23: patient received dialysis; blood culture drawn 11/22 resulted with gram positive cocci in clusters; patient started on IV Vancomycin  Assessment and Plan: Kenneth [name] is a 59 y.o. male presenting with chronic worsening bilateral multiple arthralgias (hands, wrists, knees, feet) over past >3 weeks (similar prior history), assoc with reduced appetite and generalized weakness / deconditioning. PMH is significant for HIV (well controlled on ART), ESRD (on HD), Lupus with h/o Lupus Nephritis, HTN, protein calorie malnutrition, h/o achalasia (s/p botox by GI).  Generalized Weakness w/ Arthralgias, b/l, symmetrical, sm joints - suspected mild-mod SLE flare: Not on chronic treatment for SLE. No evidence of joint effusion or infection on admission. Presented with low-grade temp on admit to 100.38F without outside fevers/chills. Does not meet SIRS criteria (only 1/4 with mild tachy mid 90s on arrival, since resolved, WBC nml, no O2, temp < 100.4, and RR nml), patient sent to ED from ID for tachycardia (HR 119) and concern decreased alertness, and initial concern was for possible infection. No source clinically available. CXR clear, skin clear (no decubitus ulcer), anuric. On admit, tired with improved alertness and fully oriented. Considered possible rhabdo with generalized pains and relative immobility recently. Pt had a temp to 102.7 early this morning, but states his arthralgias have improved. - Blood cultures: 1 bottle growing coagulase-negative Staph. - Lactic acid protocol > 1.7 (normal) > 1.2 - Trend recent CRP 19.3 (11/21) > 18.0 (11/23) - CK, total 444 (elevated, but not  consistent with rhabdo) > down to 438 - Pain management with Percocet 1-2 tabs q 4 hr PRN pain (has not taken in over 24 hours) - Baclofen PRN, K pad  - Started 20 mg prednisone 11/23 but discontinued with bacteremia. - PT/OT eval for placement --> PT recommending SNF but patient wants to go home - Made rheumatology appointment with [facility] for 05/07/2015 at 9:45, as this was earliest appointment. May cancel if no longer needed.   # Bacteremia: - Blood cultures: 1 bottle growing coagulase-negative Staph, possibly a contaminant. - Will follow-up with ID recs today - Continue Vancomycin started per pharmacy consult for now. - CD4 count, viral load pending  # Jaw pain: Patient with poor dentition. Had right upper molar removed a couple of weeks ago. No changes in vision or tenderness to palpation of his temple, so temporal arteritis is less likely. He describes the pain as a "toothache" this morning. - Orthopantogram negative for abscess or osseous abnormalities - Will continue to monitor - May consider dentistry consult or recommend follow-up with the dentist that did the tooth extraction.  # Reduced Appetite, severe calorie malnutrition, poor PO with mild dehydration, likely secondary to above pain and flare as well as CKD Suspected secondary to primary problem of joint pain with likely lupus flare. Seems to be limited by joint pain and not any GI red flags. No abdominal pain. Without nausea, vomiting. Weight loss about 5 lbs in 1 week, and about 20 lb in 5 months. Poor appetite and reduced PO. - BUN 48 > 36 > 64 this am - Continue Mirtazapine 15mg  at night  - Zofran PRN nausea - May be underlying depression per PCP - Consulted nutrition -->  add Nepro Shake BID and continue 30 ml prostat BID  # ESRD, on HD (M-W-F), in setting of prolif Lupus Nephritis, FSG, solitary kidney s/p R-nephrectomy Last HD on Monday 11/21. No missed treatments recently. Followed by [facility].  Diagnosed ESRD in 06/2014 subsequent access with L-arm AVF. Patient is anuric. - Daily wt, strict I/O - Nephrology following. Receives dialysis MWF. Had dialysis 11/23. Will receive dialysis again today. - Check daily renal function panel - Continue home phoslo  # Anemia, chronic, in setting of ESRD Hgb on admit 10.3, stable. Seems improved from previous baseline data in 07/2014, however this may be new baseline after prior iron and ESA treatments. May be hemoconcentrated but will trend CBC. - Follow CBC --> stable at 9.5 this am - Nephrology following, last iron studies 06/2014. Consider further therapy.  # HIV, well controlled on ART: RNA quant in 10/01/14 was <20, CD4 count 26 Followed by ID (Dr [name]), last seen on 02/23/15. - Continue home Dolutegravir, Abacavir, Lamivudine - CD4 count and viral load are pending  # HTN - Stable: BPs ranging from 118-138/67-79 in the last 24 hours. Normal range on admit without hypotension. Currently not on any anti-HTN meds - Monitor  # HLD: Last lipid panel (10/01/14): Chol 138, TG 91, HDL 36, LDL 84 - Will continue to hold Lipitor given complaints of joint pain.  # Constipation, chronic Last BM 1 week, with poor PO likely. - Bowel regimen 1 tab senna-docusate at night scheduled while on narcotics. Will increase to bid this morning, as Pt has still not had a bowel movement.  FEN/GI: Heart healthy diet, Bowel regimen with Senokot bid Prophylaxis: Heparin SQ (no lovenox with ESRD)  Disposition: Home likely tomorrow, pending ID recommendations.  Subjective:  Pt is eating and drinking pretty well. He does not feel sick and denies having chills, nausea, or vomiting. He states his arthralgias are mildly improved since admission. He is still having 8/10 jaw pain that feels like a "toothache".   Objective: Temp:  [98.7 F (37.1 C)-102.7 F (39.3 C)] 102.7 F (39.3 C) (11/25 0446) Pulse Rate:  [81-107] 102 (11/25 0446) Resp:  [16-22] 22 (11/25  0446) BP: (118-138)/(66-79) 138/79 mmHg (11/25 0446) SpO2:  [98 %-100 %] 100 % (11/25 0446) Weight:  [118 lb (53.524 kg)] 118 lb (53.524 kg) (11/24 2038) Physical Exam: General: Cachetic man, laying in bed in HD. HEENT; Ptosis of left eye. MMM. Poor dentition. No TTP of jaw externally. No lymphadenopathy. Cardiovascular: RRR, S1, S2, no m/r/g Respiratory: CTAB Abdomen: +BS, soft, NTND Extremities: No LE edema, no erythema or swelling of joints.   Laboratory:  Recent Labs Lab 02/24/15 0200 02/25/15 0429 02/26/15 0353  WBC 6.4 5.3 5.4  HGB 9.5* 9.4* 9.5*  HCT 29.9* 30.3* 30.5*  PLT 157 120* 128*    Recent Labs Lab 02/23/15 1814 02/24/15 0200 02/25/15 0429 02/26/15 0353  NA 134* 134* 136 136  K 4.3 4.0 4.1 4.2  CL 93* 93* 98* 101  CO2 27 30 27 26   BUN 46* 48* 36* 64*  CREATININE 7.53* 8.15* 5.20* 7.87*  CALCIUM 8.1* 7.9* 8.1* 7.7*  PROT 7.4 7.3  --   --   BILITOT 0.8 0.3  --   --   ALKPHOS 37* 36*  --   --   ALT 22 22  --   --   AST 58* 50*  --   --   GLUCOSE 161* 120* 111* 100*    Imaging/Diagnostic Tests: DG orthopantogram: negative for  periapical abscess or osseous abnormality  Sela Hua, MD 02/26/2015, 7:32 AM PGY-1, [place] Intern pager: [phone], text pages welcome

## 2015-02-26 NOTE — Progress Notes (Addendum)
[facility] for Infectious Disease    Date of Admission:  02/23/2015   Total days of antibiotics 3        Day 2 vanco           ID: Kenneth [name] is a 59 y.o. male with well controlled HIV disease, ESRD admitted for myalgia, arthralgias, feeling poorly x 3 wk Active Problems:   HIV disease ([place])   Essential hypertension   Arthralgia of multiple sites, bilateral   Protein-calorie malnutrition, severe (HCC)   Lupus nephritis ([place])   ESRD (end stage renal disease) ([place])   Unintentional weight loss   Weakness   Exacerbation of systemic lupus (HCC)   Bacteremia   Subjective: He had high fever up to 102F last night, though asymptomatic this morning. He reports having significant pain to joints of hands this afternoon  Interval hx: GPCC on blood cx -> CoNS  Review of Systems      Medications:  . abacavir  600 mg Oral Daily  . calcitRIOL  1 mcg Oral Q M,W,F-HD  . calcium acetate  1,334 mg Oral TID WC  . dolutegravir  50 mg Oral Daily  . feeding supplement (NEPRO CARB STEADY)  237 mL Oral BID BM  . feeding supplement (PRO-STAT SUGAR FREE 64)  30 mL Oral BID  . heparin  5,000 Units Subcutaneous 3 times per day  . lamiVUDine  50 mg Oral Daily  . mirtazapine  15 mg Oral QHS  . multivitamin  1 tablet Oral QHS  . pneumococcal 23 valent vaccine  0.5 mL Intramuscular Tomorrow-1000  . senna-docusate  1 tablet Oral QHS  . sodium chloride  3 mL Intravenous Q12H    Objective: Vital signs in last 24 hours: Temp:  [98.7 F (37.1 C)-102.7 F (39.3 C)] 102.1 F (38.9 C) (11/25 0804) Pulse Rate:  [81-107] 95 (11/25 1000) Resp:  [16-22] 20 (11/25 1000) BP: (118-155)/(67-80) 155/80 mmHg (11/25 1000) SpO2:  [98 %-100 %] 100 % (11/25 1000) Weight:  [118 lb (53.524 kg)-120 lb 9.5 oz (54.7 kg)] 120 lb 9.5 oz (54.7 kg) (11/25 0804)  Physical Exam  Constitutional: He is oriented to person, place, and time. He appears well-developed and well-nourished. No distress. Thin  frame HENT:  Mouth/Throat: Oropharynx is clear and moist. No oropharyngeal exudate.  Cardiovascular: Normal rate, regular rhythm and normal heart sounds. Exam reveals no gallop and no friction rub.  No murmur heard.  Pulmonary/Chest: Effort normal and breath sounds normal. No respiratory distress. He has no wheezes.  Chest: ribs and clavicle quite prominent Abdominal: Soft. Bowel sounds are normal. He exhibits no distension. There is no tenderness.  Lymphadenopathy:  He has no cervical adenopathy.  Neurological: He is alert and oriented to person, place, and time. Moving all extremities without weakness, mild pain Skin: Skin is warm and dry. No rash noted. No erythema.  Psychiatric: He has a normal mood and affect. His behavior is normal.  Ext: no effusion to large joints    Lab Results  Recent Labs  02/25/15 0429 02/26/15 0353  WBC 5.3 5.4  HGB 9.4* 9.5*  HCT 30.3* 30.5*  NA 136 136  K 4.1 4.2  CL 98* 101  CO2 27 26  BUN 36* 64*  CREATININE 5.20* 7.87*   Liver Panel  Recent Labs  02/23/15 1814 02/24/15 0200 02/25/15 0429 02/26/15 0353  PROT 7.4 7.3  --   --   ALBUMIN 2.3* 2.3* 2.0* 2.0*  AST 58* 50*  --   --  ALT 22 22  --   --   ALKPHOS 37* 36*  --   --   BILITOT 0.8 0.3  --   --    Sedimentation Rate No results for input(s): ESRSEDRATE in the last 72 hours. C-Reactive Protein  Recent Labs  02/24/15 0200  CRP 18.0*    Microbiology: 11/22 blood cx 1 of 2 sets [facility] Studies/Results: Dg Orthopantogram  02/25/2015  CLINICAL DATA:  Generalized jaw pain, being treated for malnutrition. Dialysis patient with decreased appetite, weight loss, weakness. EXAM: ORTHOPANTOGRAM/PANORAMIC COMPARISON:  None. FINDINGS: Numerous missing maxillary and mandibular teeth. No convincing evidence of a periapical dental abscess. No acute- appearing cortical irregularity or osseous lesion seen. No destructive changes to suggest osteomyelitis. No fracture line or displaced  fracture fragment. IMPRESSION: Unremarkable plain film examination of the mandible. No acute findings. Electronically Signed   By: Franki Cabot M.D.   On: 02/25/2015 08:20     Assessment/Plan: 59yo M with well controlled HIV disease, CD 4 count of 400/VL<20 on ESRD having diffuse arthralgia, myalgias x 3. No leukocytosis or fever. Also found to have elevated inflammatory markers with clinical presentation of small joint involvement  Reactive athritis: differential could include lupus flare of arthritis, vs. PMR  - recommend to do a trial of steroids to treat reactive arthritis to see if improvement in symptoms. I am somewhat suspicious that he may have element of PMR that would need longer steroids. Would see if pt can move up his rheum appt. Dr. Amil Amen and Lenna Gilford have split off from [facility] and may have earlier appts.  - GPCC in 1 of 2 sets of blood culture = finalized as CoNS, will d/c vancomcyin  -  HIV disease - continue with his current regimen. Await VL. No need to change regimen empirically  - ESRD, continue on current schedule for HD  Anorexia/poor nutritional intake/moderate protein caloric malnutrition = appreciate nutrition input  Baxter Flattery [facility] for Infectious Diseases Cell: [phone] Pager: [phone]  02/26/2015, 10:43 AM

## 2015-02-26 NOTE — Evaluation (Signed)
Occupational Therapy Evaluation Patient Details Name: Kenneth [name] MRN: [id number] DOB: 13-Mar-1956 Today's Date: 02/26/2015    History of Present Illness Kenneth [name] is a 59 y.o. male presenting with chronic worsening bilateral multiple arthralgias over past >3 weeks, assoc with reduced appetite and generalized weakness / deconditioning. PMH is significant for HIV, ESRD (on HD), Lupus with h/o Lupus Nephritis, HTN, protein calorie malnutrition, h/o achalasia   Clinical Impression   Pt admitted with above. He demonstrates the below listed deficits and will benefit from continued OT to maximize safety and independence with BADLs.  Currently, pt with pain bil. Hands 9/10 with only ~50% AROM bil. Hands.  He is very slow to process information and slow to initiate activity.  He requires min A for ADL, but requires significant time and effort to do so.  He reports he has friends who can assist him at discharge.   At this point, feel he optimally needs 24 hour (or close to it) assistance at discharge.  My concern is that he is not going to be able to perform all activities required of him over the course of a day as he fatigues - and concerned that he may become too fatigued to prepare meals, or eat.  When asked if friends can drive him to HD, he states he feels he can drive himself.  At present, his reaction time is very delayed and do not feel it is safe for driving.   IF he is to discharge home, recommend he have at least assistance throughout the daytime hours, assist with acquiring groceries, meal prep, household management, and driving.   IF this is not available then I think safest option is SNF level rehab.         Home health OT;Supervision/Assistance - 24 hour; if pt does not have adequate level of assistance at discharge, recommend SNF level rehab.  See comments above.    Equipment Recommendations  3 in 1 bedside comode    Recommendations for Other Services       Precautions /  Restrictions Precautions Precautions: Fall      Mobility Bed Mobility Overal bed mobility: Needs Assistance Bed Mobility: Sit to Supine       Sit to supine: Min assist   General bed mobility comments: Min A to lift LE onto bed.  Requires increased time   Transfers Overall transfer level: Needs assistance Equipment used: Rolling walker (2 wheeled) Transfers: Sit to/from [facility] Sit to Stand: Min assist Stand pivot transfers: Min guard       General transfer comment: required min A to move from sit to stand.  Demonstrated posterior loss of balance     Balance Overall balance assessment: Needs assistance Sitting-balance support: Feet supported Sitting balance-Leahy Scale: Good     Standing balance support: Bilateral upper extremity supported Standing balance-Leahy Scale: Fair                              ADL Overall ADL's : Needs assistance/impaired Eating/Feeding: Set up;Sitting Eating/Feeding Details (indicate cue type and reason): Pt with decreased apetite  Grooming: Wash/dry hands;Wash/dry face;Oral care;Brushing hair;Minimal assistance;Standing;Sitting Grooming Details (indicate cue type and reason): difficulty manipulating toothbrush  Upper Body Bathing: Minimal assitance;Sitting   Lower Body Bathing: Minimal assistance;Sit to/from stand   Upper Body Dressing : Minimal assistance;Sitting   Lower Body Dressing: Minimal assistance;Sit to/from stand   Toilet Transfer: Minimal assistance;Ambulation;Comfort height toilet;Grab bars;RW  Toileting- Clothing Manipulation and Hygiene: Minimal assistance;Sit to/from stand       Functional mobility during ADLs: Min guard;Rolling walker;Minimal assistance General ADL Comments: status fluctuates depending upon pain and fatigue. Pt limited by bil. hand pain, and decreased processing speed      Vision     Perception     Praxis      Pertinent Vitals/Pain Pain Assessment:  0-10 Pain Score: 9  Pain Location: bill hands  Pain Descriptors / Indicators: Aching;Guarding;Grimacing Pain Intervention(s): Monitored during session     Hand Dominance Right   Extremity/Trunk Assessment Upper Extremity Assessment Upper Extremity Assessment: RUE deficits/detail;LUE deficits/detail;Generalized weakness RUE Deficits / Details: AROM limited to ~100*.  AAROM WFL.  Pt reports tightness bil. shoulders.  Pt with ~50% AROM flexion Rt hand.  PROM WFL, but increased joint pain.  Pt appears to have motor planind deficits when attempting to perform ROM  RUE Coordination: decreased fine motor;decreased gross motor LUE Deficits / Details: AROM limited to ~100*.  AAROM WFL.  Pt reports tightness bil. shoulders.  Pt with ~50% AROM flexion Rt hand.  PROM WFL, but increased joint pain.  Pt appears to have motor planind deficits when attempting to perform ROM  LUE Coordination: decreased fine motor;decreased gross motor   Lower Extremity Assessment Lower Extremity Assessment: Defer to PT evaluation       Communication Communication Communication: No difficulties   Cognition Arousal/Alertness: Awake/alert Behavior During Therapy: Flat affect Overall Cognitive Status: Impaired/Different from baseline Area of Impairment: Attention;Problem solving;Awareness;Safety/judgement   Current Attention Level: Sustained   Following Commands: Follows one step commands consistently;Follows one step commands with increased time Safety/Judgement: Decreased awareness of safety Awareness: Intellectual Problem Solving: Slow processing;Decreased initiation;Requires verbal cues;Requires tactile cues General Comments: Pt moves very slowly.  requires signfiicant amount of time to initiate activity.   Pt feels he is safe to return to driving    General Comments       Exercises Exercises: Other exercises Other Exercises Other Exercises: Pt perrformed 6 reps AAROM shoulder flexion each UE Other  Exercises: PROM performed each digit.  Full PROM achicieved, but pain present mostly in MCP joints  Other Exercises: Pt declined use of theraputty or ball at this point due to increased pain    Shoulder Instructions      Home Living Family/patient expects to be discharged to:: Private residence Living Arrangements: Alone Available Help at Discharge: Friend(s);Available PRN/intermittently Type of Home: Apartment Home Access: Level entry     Home Layout: One level     Bathroom Shower/Tub: Occupational psychologist: Standard Bathroom Accessibility: Yes   Home Equipment: Shower seat - built in          Prior Functioning/Environment Level of Independence: Independent        Comments: drives; 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    OT Diagnosis: Generalized weakness;Cognitive deficits;Acute pain   OT Problem List: Decreased strength;Decreased activity tolerance;Decreased range of motion;Impaired balance (sitting and/or standing);Impaired vision/perception;Decreased coordination;Decreased cognition;Decreased safety awareness;Decreased knowledge of use of DME or AE;Impaired UE functional use;Pain   OT Treatment/Interventions: Self-care/ADL training;Therapeutic exercise;Neuromuscular education;DME and/or AE instruction;Therapeutic activities;Cognitive remediation/compensation;Patient/family education;Balance training    OT Goals(Current goals can be found in the care plan section) Acute Rehab OT Goals Patient Stated Goal: to reduce pain  OT Goal Formulation: With patient Time For Goal Achievement: 03/12/15 Potential to Achieve Goals: Good ADL Goals Pt Will Perform Grooming: with modified independence;standing Pt Will Perform Upper Body Bathing: with modified independence;sitting Pt Will  Perform Lower Body Bathing: with modified independence;sit to/from stand Pt Will Perform Upper Body Dressing: with modified independence;sitting Pt Will Perform Lower Body Dressing: with modified  independence;sit to/from stand Pt Will Transfer to Toilet: with modified independence;ambulating;regular height toilet;bedside commode;grab bars Pt Will Perform Toileting - Clothing Manipulation and hygiene: with modified independence;sit to/from stand Pt Will Perform Tub/Shower Transfer: Shower transfer;with modified independence;ambulating;shower seat;rolling walker Pt/caregiver will Perform Home Exercise Program: Increased ROM;Right Upper extremity;Left upper extremity;Independently;With written HEP provided  OT Frequency: Min 3X/week   Barriers to D/C: Decreased caregiver support          Co-evaluation              End of Session Equipment Utilized During Treatment: Rolling walker Nurse Communication: Mobility status  Activity Tolerance: Patient limited by pain Patient left: in bed;with call bell/phone within reach   Time: 1801-1840 OT Time Calculation (min): 39 min Charges:  OT General Charges $OT Visit: 1 Procedure OT Evaluation $Initial OT Evaluation Tier I: 1 Procedure OT Treatments $Self Care/Home Management : 8-22 mins $Therapeutic Activity: 8-22 mins G-Codes:    Kairyn Olmeda M Mar 14, 2015, 7:01 PM

## 2015-02-26 NOTE — Progress Notes (Signed)
Physical Therapy Treatment Patient Details Name: Kenneth [name] MRN: [id number] DOB: 11-16-1955 Today's Date: 02/26/2015    History of Present Illness Kenneth [name] is a 59 y.o. male presenting with chronic worsening bilateral multiple arthralgias over past >3 weeks, assoc with reduced appetite and generalized weakness / deconditioning. PMH is significant for HIV, ESRD (on HD), Lupus with h/o Lupus Nephritis, HTN, protein calorie malnutrition, h/o achalasia    PT Comments    Making good progress towards PT goals. Ambulating up to 300 feet with minimal use of RW today. Still with some delayed processing but appropriate execution of commands. Updated d/c recommendations as I anticipate he will continue to improve while admitted. Should have HHPT follow-up. Patient will continue to benefit from skilled physical therapy services to further improve independence with functional mobility.   Follow Up Recommendations  Home health PT;Supervision - Intermittent     Equipment Recommendations   (Pending progress)    Recommendations for Other Services OT consult     Precautions / Restrictions Precautions Precautions: Fall Restrictions Weight Bearing Restrictions: No    Mobility  Bed Mobility Overal bed mobility: Needs Assistance Bed Mobility: Supine to Sit     Supine to sit: HOB elevated;Supervision     General bed mobility comments: supervision for safety. HOB elevated, no physical assist needed today. Required extra time.  Transfers Overall transfer level: Needs assistance Equipment used: Rolling walker (2 wheeled) Transfers: Sit to/from Stand Sit to Stand: Min guard         General transfer comment: Close guard for safety. VC for hand placement. Leans heavily on RW initially.   Ambulation/Gait Ambulation/Gait assistance: Supervision Ambulation Distance (Feet): 300 Feet Assistive device: Rolling walker (2 wheeled) Gait Pattern/deviations: Step-through  pattern;Decreased stride length Gait velocity: decreased Gait velocity interpretation: Below normal speed for age/gender General Gait Details: Greatly improved endurance, posture, and walker control. No overt loss of balance noted during this bout. Somewhat slow response to commands but appropriate actions carried out. Able to step backwards safely with supervision. HR elevated to 132 however pt has no complaints.   Stairs            Wheelchair Mobility    Modified Rankin (Stroke Patients Only)       Balance                                    Cognition Arousal/Alertness: Awake/alert Behavior During Therapy: Flat affect Overall Cognitive Status: Impaired/Different from baseline Area of Impairment: Problem solving;Following commands       Following Commands: Follows multi-step commands with increased time     Problem Solving: Slow processing      Exercises General Exercises - Lower Extremity Ankle Circles/Pumps: AROM;Both;10 reps;Seated Long Arc Quad: Strengthening;Both;10 reps;Seated Hip Flexion/Marching: Strengthening;Both;10 reps;Seated    General Comments        Pertinent Vitals/Pain Pain Assessment: No/denies pain    Home Living                      Prior Function            PT Goals (current goals can now be found in the care plan section) Acute Rehab PT Goals Patient Stated Goal: Get better before going home PT Goal Formulation: With patient Time For Goal Achievement: 03/10/15 Potential to Achieve Goals: Good Progress towards PT goals: Progressing toward goals    Frequency  Min 3X/week  PT Plan Discharge plan needs to be updated    Co-evaluation             End of Session Equipment Utilized During Treatment: Gait belt Activity Tolerance: Patient tolerated treatment well Patient left: with call bell/phone within reach;in chair     Time: [id number] PT Time Calculation (min) (ACUTE ONLY): 22  min  Charges:  $Gait Training: 8-22 mins                    G Codes:      Ellouise Newer 03/27/2015, 3:34 PM  Camille Bal [place], [place]

## 2015-02-27 LAB — CBC
HEMATOCRIT: 32.5 % — AB (ref 39.0–52.0)
Hemoglobin: 10.4 g/dL — ABNORMAL LOW (ref 13.0–17.0)
MCH: 30.6 pg (ref 26.0–34.0)
MCHC: 32 g/dL (ref 30.0–36.0)
MCV: 95.6 fL (ref 78.0–100.0)
PLATELETS: 82 10*3/uL — AB (ref 150–400)
RBC: 3.4 MIL/uL — ABNORMAL LOW (ref 4.22–5.81)
RDW: 15.3 % (ref 11.5–15.5)
WBC: 5.1 10*3/uL (ref 4.0–10.5)

## 2015-02-27 LAB — BASIC METABOLIC PANEL
ANION GAP: 11 (ref 5–15)
BUN: 47 mg/dL — AB (ref 6–20)
CO2: 27 mmol/L (ref 22–32)
Calcium: 7.7 mg/dL — ABNORMAL LOW (ref 8.9–10.3)
Chloride: 95 mmol/L — ABNORMAL LOW (ref 101–111)
Creatinine, Ser: 5.85 mg/dL — ABNORMAL HIGH (ref 0.61–1.24)
GFR calc Af Amer: 11 mL/min — ABNORMAL LOW (ref >60)
GFR, EST NON AFRICAN AMERICAN: 10 mL/min — AB (ref >60)
GLUCOSE: 119 mg/dL — AB (ref 65–99)
POTASSIUM: 4.4 mmol/L (ref 3.5–5.1)
Sodium: 133 mmol/L — ABNORMAL LOW (ref 135–145)

## 2015-02-27 MED ORDER — PREDNISONE 20 MG PO TABS
40.0000 mg | ORAL_TABLET | Freq: Every day | ORAL | Status: DC
  Administered 2015-02-27: 40 mg via ORAL
  Filled 2015-02-27: qty 2

## 2015-02-27 MED ORDER — SENNOSIDES-DOCUSATE SODIUM 8.6-50 MG PO TABS: 2.0000 | ORAL_TABLET | Freq: Two times a day (BID) | ORAL | Status: DC

## 2015-02-27 MED ORDER — PREDNISONE 20 MG PO TABS: 20.0000 mg | ORAL_TABLET | Freq: Every day | ORAL | Status: DC

## 2015-02-27 MED ORDER — SENNOSIDES-DOCUSATE SODIUM 8.6-50 MG PO TABS
1.0000 | ORAL_TABLET | Freq: Two times a day (BID) | ORAL | Status: DC
  Administered 2015-02-27 (×2): 1 via ORAL
  Filled 2015-02-27: qty 1

## 2015-02-27 NOTE — Discharge Instructions (Signed)
You were hospitalized because you were having joint pain and weakness. We think this occurred because you had a flare of your lupus. We started you on Prednisone, which should help with the inflammation. You should continue to take the Prednisone daily for 5 more days. I have sent this prescription in to your pharmacy.  Please stop taking your Lipitor for now. Lipitor can cause muscles aches and we don't want this medication to make your muscle and joint pain worse. It is possible that this medication may be restarted at your hospital follow-up visit if your muscle aches have improved.  We have scheduled you an appointment with the Rheumatologist for February 3rd. Please call them to see if any earlier appointments have opened up.

## 2015-02-27 NOTE — Progress Notes (Signed)
Discharged patient on stable condition,no complaint,no open skin issues/skin intact.Discharge paper printed,explained and given to patient.Reminded him of his pending appointment.Questions were answered satisfactorily at the time of discharged.

## 2015-02-27 NOTE — Progress Notes (Signed)
Kenneth [name] Progress Note  Assessment/Plan: 1. Generalized Weakness w/ Arthralgias, b/l, symmetrical, sm joints - suspected mild-mod SLE flare: Per Primary. Infectious disease following. Was recently referred to Dr. Algis [name] office from HD center. Improving. Per ID, lupus flare VS PMR. On prednisone.  2. ESRD - MWF at [facility]. HD 02/26/15. Next treatment Monday 03/01/15 3. Hypertension/volume - No OP antihypertensive meds. BP stable at present. HD 11/25 Net UF 1500. Post wt 53 kg. Now 7 kgs below OP EDW.  4. Anemia - Hgb 9.5 Last dose of Mircera 100 mcg IV 02/17/15. Follow CBC.  5. Metabolic bone disease - Ca 7.7 C Ca 9.3.  Last incenter PTH 457 (02/10/15). Continue binders, calcitriol.  6. Nutrition - Albumin 2.0. Appetite somewhat improved.  regular diet, add prostat. DC boost, start nepro. 7. HIV: Per primary/Infectious disease. 8. Elevated CPK's- possibly related to medications or myositis.Appreciate ID input.  After further questioning, Kenneth [name] reports that he has been taking his statin therapy at home despite being held by his Nephrologist so this may also explain the myalgias and mildly elevated CPK's. Continue to hold atorvastatin  1. 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    9. Caloric and protein malnutrition- pt has bitemporal wasting and is 7 kg below his EDW. Now with FTT, await ID input regarding prognosis and potential etiology   Kenneth H. Brown NP-C 02/27/2015, 10:07 AM  [name] [phone]  Subjective:     Objective Filed Vitals:   02/26/15 1900 02/26/15 2122 02/27/15 0447 02/27/15 0939  BP: 144/89 147/87 145/92 138/85  Pulse: 1 101 106 117  Temp: 98.6 F (37 C) 99.6 F (37.6 C) 99.7 F (37.6 C) 98.9 F (37.2 C)  TempSrc: Oral Oral Oral Oral  Resp: 20 18 19 18   Height:      Weight:  53 kg (116 lb 13.5 oz)    SpO2: 100% 99% 97% 97%   Physical Exam General: cachectic appearing AAM in  NAD Heart: S1, S2, RRR. No M/R/G Lungs: CTA AP Abdomen: SNT, active BS. Extremities: no LE edema. Pulse intact Dialysis Access: LUA AVF + bruit  Dialysis Orders: Center: [facility] on MWF . 4 hrs 0 min, 180NRe Optiflux, BFR 400, DFR Manual 800 mL/min, EDW 60 (kg), Dialysate 2.0 K, 2.0 Ca, UFR Profile: None, Sodium Model: None, Access: LUA AV Fistula Heparin: 2000 units per treatment Mircera 100 mcg IV q 2 weeks (Last dose 02/17/2015) Calcitriol 1.0 mcg PO MWF (last dose 02/22/2015)  Additional Objective Labs: Basic Metabolic Panel:  Recent Labs Lab 02/25/15 0429 02/26/15 0353 02/27/15 0531  NA 136 136 133*  K 4.1 4.2 4.4  CL 98* 101 95*  CO2 27 26 27   GLUCOSE 111* 100* 119*  BUN 36* 64* 47*  CREATININE 5.20* 7.87* 5.85*  CALCIUM 8.1* 7.7* 7.7*  PHOS 3.2 2.9  --    Liver Function Tests:  Recent Labs Lab 02/23/15 1814 02/24/15 0200 02/25/15 0429 02/26/15 0353  AST 58* 50*  --   --   ALT 22 22  --   --   ALKPHOS 37* 36*  --   --   BILITOT 0.8 0.3  --   --   PROT 7.4 7.3  --   --   ALBUMIN 2.3* 2.3* 2.0* 2.0*   No results for input(s): LIPASE, AMYLASE in the last 168 hours. CBC:  Recent Labs Lab 02/23/15 2015 02/24/15 0200 02/25/15 0429 02/26/15 0353 02/27/15 0531  WBC 7.8 6.4 5.3  5.4 5.1  NEUTROABS 6.7 5.2 4.3 4.4  --   HGB 10.3* 9.5* 9.4* 9.5* 10.4*  HCT 31.7* 29.9* 30.3* 30.5* 32.5*  MCV 95.8 95.2 96.5 95.6 95.6  PLT 157 157 120* 128* 82*   Blood Culture    Component Value Date/Time   SDES BLOOD RIGHT HAND 02/23/2015 2000   SPECREQUEST BOTTLES DRAWN AEROBIC AND ANAEROBIC 5CC 02/23/2015 2000   CULT NO GROWTH 3 DAYS 02/23/2015 2000   REPTSTATUS PENDING 02/23/2015 2000    Cardiac Enzymes:  Recent Labs Lab 02/23/15 2349 02/24/15 0200  CKTOTAL 444* 438*   CBG:  Recent Labs Lab 02/24/15 1636  GLUCAP 103*   Iron Studies: No results for input(s): IRON, TIBC, TRANSFERRIN, FERRITIN in the last 72 hours. @lablastinr3 @ Studies/Results: No results  found. Medications:   . abacavir  600 mg Oral Daily  . calcitRIOL  1 mcg Oral Q M,W,F-HD  . calcium acetate  1,334 mg Oral TID WC  . dolutegravir  50 mg Oral Daily  . feeding supplement (NEPRO CARB STEADY)  237 mL Oral BID BM  . feeding supplement (PRO-STAT SUGAR FREE 64)  30 mL Oral BID  . heparin  5,000 Units Subcutaneous 3 times per day  . lamiVUDine  50 mg Oral Daily  . mirtazapine  15 mg Oral QHS  . multivitamin  1 tablet Oral QHS  . pneumococcal 23 valent vaccine  0.5 mL Intramuscular Tomorrow-1000  . predniSONE  20 mg Oral Q breakfast  . senna-docusate  1 tablet Oral BID  . sodium chloride  3 mL Intravenous Q12H    I have seen and examined this patient and agree with plan as outlined by Kenneth Fairly, NP.  He is back on schedule with HD and feels [name] little better. Kenneth Maragh [name],Kenneth [name] 02/27/2015 12:35 PM

## 2015-02-27 NOTE — Progress Notes (Signed)
[facility] for Infectious Disease    Date of Admission:  02/23/2015   Total days of antibiotics 3        Day 2 vanco           ID: Kenneth [name] is a 59 y.o. male with well controlled HIV disease, ESRD admitted for myalgia, arthralgias, feeling poorly x 3 wk Active Problems:   HIV disease ([place])   Essential hypertension   Arthralgia of multiple sites, bilateral   Protein-calorie malnutrition, severe (HCC)   Lupus nephritis ([place])   ESRD (end stage renal disease) ([place])   Unintentional weight loss   Weakness   Exacerbation of systemic lupus (HCC)   Bacteremia   Subjective: Slightly improved pains to hands this morning. Afebrile this am.   Review of Systems  Poor po intake, arthralgias  Medications:  . abacavir  600 mg Oral Daily  . calcitRIOL  1 mcg Oral Q M,W,F-HD  . calcium acetate  1,334 mg Oral TID WC  . dolutegravir  50 mg Oral Daily  . feeding supplement (NEPRO CARB STEADY)  237 mL Oral BID BM  . feeding supplement (PRO-STAT SUGAR FREE 64)  30 mL Oral BID  . heparin  5,000 Units Subcutaneous 3 times per day  . lamiVUDine  50 mg Oral Daily  . mirtazapine  15 mg Oral QHS  . multivitamin  1 tablet Oral QHS  . pneumococcal 23 valent vaccine  0.5 mL Intramuscular Tomorrow-1000  . predniSONE  20 mg Oral Q breakfast  . senna-docusate  1 tablet Oral BID  . sodium chloride  3 mL Intravenous Q12H    Objective: Vital signs in last 24 hours: Temp:  [98.6 F (37 C)-99.7 F (37.6 C)] 98.9 F (37.2 C) (11/26 0939) Pulse Rate:  [1-117] 117 (11/26 0939) Resp:  [18-20] 18 (11/26 0939) BP: (138-147)/(85-92) 138/85 mmHg (11/26 0939) SpO2:  [97 %-100 %] 97 % (11/26 0939) Weight:  [116 lb 13.5 oz (53 kg)] 116 lb 13.5 oz (53 kg) (11/25 2122)  Physical Exam  Constitutional: He is oriented to person, place, and time. He appears well-developed and well-nourished. No distress. Thin frame HENT:  Mouth/Throat: Oropharynx is clear and moist. No oropharyngeal exudate.    Cardiovascular: Normal rate, regular rhythm and normal heart sounds. Exam reveals no gallop and no friction rub.  No murmur heard.  Pulmonary/Chest: Effort normal and breath sounds normal. No respiratory distress. He has no wheezes.  Chest: ribs and clavicle quite prominent Abdominal: Soft. Bowel sounds are normal. He exhibits no distension. There is no tenderness.  Lymphadenopathy:  He has no cervical adenopathy.  Neurological: He is alert and oriented to person, place, and time. Moving all extremities without weakness, mild pain Skin: Skin is warm and dry. No rash noted. No erythema.  Psychiatric: He has a normal mood and affect. His behavior is normal.  Ext: no effusion to large joints    Lab Results  Recent Labs  02/26/15 0353 02/27/15 0531  WBC 5.4 5.1  HGB 9.5* 10.4*  HCT 30.5* 32.5*  NA 136 133*  K 4.2 4.4  CL 101 95*  CO2 26 27  BUN 64* 47*  CREATININE 7.87* 5.85*   Liver Panel  Recent Labs  02/25/15 0429 02/26/15 0353  ALBUMIN 2.0* 2.0*   Lab Results  Component Value Date   ESRSEDRATE 124* 02/22/2015    Microbiology: 11/22 blood cx 1 of 2 sets [facility] Studies/Results: No results found.   Assessment/Plan: 59yo M with well  controlled HIV disease, CD 4 count of 400/VL<20 on ESRD having diffuse arthralgia, myalgias x 3. No leukocytosis or fever. Also found to have elevated inflammatory markers with clinical presentation of small joint involvement  Reactive athritis: differential could include lupus flare of arthritis, vs. PMR  - recommend to do increase pred to 40-60mg  per day x 7 days, then can taper down for  reactive arthritis to see if improvement in symptoms. I am somewhat suspicious that he may have element of PMR that would need longer steroids. Would see if pt can move up his rheum appt. Dr. Amil Amen and Lenna Gilford have split off from [facility] and may have earlier appts.  - [facility] in 1 of 2 sets of blood culture = finalized as CoNS, will d/c vancomcyin  -  HIV  disease - continue with his current regimen. VL undetectable. No need to change regimen   - ESRD, continue on current schedule for HD  Anorexia/poor nutritional intake/moderate protein caloric malnutrition = appreciate nutrition input  Baxter Flattery [facility] for Infectious Diseases Cell: [phone] Pager: [phone]  02/27/2015, 2:10 PM

## 2015-02-27 NOTE — Progress Notes (Signed)
Family Medicine Teaching Service Daily Progress Note Intern Pager: [phone]  Patient name: Kenneth [name] Medical record number: [id number] Date of birth: [birth year] Age: 59 y.o. Gender: male  Primary Care Provider: Cordelia Poche, MD Consultants: Nephrology, ID Code Status: Full  Pt Overview and Major Events to Date:  - 11/23: patient received dialysis; blood culture drawn 11/22 resulted with gram positive cocci in clusters; patient started on IV Vancomycin - 11/25: Blood culture grew coag-negative staph, Vanc d/c'ed and Prednisone restarted.  Assessment and Plan: Kenneth [name] is a 59 y.o. male presenting with chronic worsening bilateral multiple arthralgias (hands, wrists, knees, feet) over past >3 weeks (similar prior history), assoc with reduced appetite and generalized weakness / deconditioning. PMH is significant for HIV (well controlled on ART), ESRD (on HD), Lupus with h/o Lupus Nephritis, HTN, protein calorie malnutrition, h/o achalasia (s/p botox by GI).  Generalized Weakness w/ Arthralgias, b/l, symmetrical, sm joints - suspected mild-mod SLE flare: Not on chronic treatment for SLE. Pt initially presented from ID with tachycardia and decreased alertness, concern for possible infection. Did not meet SIRS criteria and no evidence of infection on exam, labs, or imagine. Pt initially on Vanc for blood culture growing coagulase-negative Staph, but Vanc was d/c'ed as this is likely a contaminant. Pt has been spiking intermittent low grade temperatures, with the last one occurring yesterday at noon. Pt states his joint pain is much better this morning.  - ID believes this is lupus flare vs PMR. Recommend trial of steroids, but if he has an element of PMR, may need longer course.  - Continue Prednisone 20mg  for lupus flare. Will discharge home with 7 day course. - Pain management with Percocet 1-2 tabs q 4 hr PRN pain (Pt has taken this x 1 in the last 3 days). - Baclofen PRN, K pad  -  PT/OT eval for placement --> PT recommending HHPT, but Pt is not interested in this. - Made rheumatology appointment with [facility] for 05/07/2015 at 9:45, as this was earliest appointment. May cancel if no longer needed.   # Positive blood cultures: 1 bottle growing coagulase-negative Staph, which is likely a contaminant. - Vanc was d/c'ed on 11/25 per ID recs.  # Jaw pain: Patient with poor dentition. Had right upper molar removed a couple of weeks ago. No changes in vision or tenderness to palpation of his temple, so temporal arteritis is less likely. He describes the pain as a "toothache" this morning. - Orthopantogram negative for abscess or osseous abnormalities - Will continue to monitor - Recommend follow-up with dentist who performed his tooth extraction.  # Reduced Appetite, severe calorie malnutrition, poor PO with mild dehydration, likely secondary to above pain and flare as well as CKD: Suspected secondary to primary problem of joint pain with likely lupus flare. Seems to be limited by joint pain and not any GI red flags. No abdominal pain. Without nausea, vomiting. Weight loss about 5 lbs in 1 week, and about 20 lb in 5 months. Poor appetite and reduced PO. - Continue Mirtazapine 15mg  at night  - Zofran PRN nausea - May be underlying depression per PCP - Consulted nutrition --> add Nepro Shake BID and continue 30 ml prostat BID  # ESRD, on HD (M-W-F), in setting of prolif Lupus Nephritis, FSG, solitary kidney s/p R-nephrectomy: Last HD on Monday 11/21. No missed treatments recently. Followed by [facility]. Diagnosed ESRD in 06/2014 subsequent access with L-arm AVF. Patient is anuric. - Daily wt, strict I/O -  Nephrology following. Receives dialysis MWF.  - Continue home phoslo - Daily BMETs  # Anemia, chronic, in setting of ESRD: Hgb on admit 10.3, stable. Seems improved from previous baseline data in 07/2014, however this may be new baseline after prior iron and ESA  treatments. May be hemoconcentrated. - Follow CBC --> stable at 10.4 this am - Nephrology following.  # HIV, well controlled on ART: RNA quant in 10/01/14 was <20, CD4 count 26 Followed by ID (Dr [name]), last seen on 02/23/15. - Continue home Dolutegravir, Abacavir, Lamivudine - CD4 count is 70, VL < 20. Per ID, CD4 count can be falsely low at times and they would not recommend prophylactic antibiotics at this time.  # HTN - Stable: BPs ranging from 122-155/73-80 in the last 24 hours. Normal range on admit without hypotension. Currently not on any anti-HTN meds - Monitor  # HLD: Last lipid panel (10/01/14): Chol 138, TG 91, HDL 36, LDL 84 - Will continue to hold Lipitor given complaints of joint pain and elevated CK to 438.  # Constipation, chronic Last BM 1 week, with poor PO likely. - Continue bowel regimen with Senokot bid.  FEN/GI: Heart healthy diet, Bowel regimen with Senokot bid Prophylaxis: Heparin SQ (no lovenox with ESRD)  Disposition: Home likely today, if Pt's friend will be back from out of town to help care for him.  Subjective:  Pt states he is feeling better this morning. His joint pain has improved. He is not interested in going to a SNF and understands that we feel this would be the safest place for him to go. He has a friend that stays with him and provides care for him. His friend is out of town visiting family and will return today. Pt would like to go home today if his friend will be there.  Objective: Temp:  [98.6 F (37 C)-102.1 F (38.9 C)] 99.7 F (37.6 C) (11/26 0447) Pulse Rate:  [1-116] 106 (11/26 0447) Resp:  [16-20] 19 (11/26 0447) BP: (122-155)/(68-92) 145/92 mmHg (11/26 0447) SpO2:  [97 %-100 %] 97 % (11/26 0447) Weight:  [116 lb 13.5 oz (53 kg)-120 lb 9.5 oz (54.7 kg)] 116 lb 13.5 oz (53 kg) (11/25 2122) Physical Exam: General: Cachetic man, sitting up in bed, eating breakfast, in NAD. HEENT: Temporal wasting present bilaterally, ptosis of  left eye. MMM. Poor dentition.  Cardiovascular: RRR, S1, S2, no m/r/g Respiratory: CTAB Abdomen: +BS, soft, NTND Extremities: No LE edema, no erythema or swelling of joints.   Laboratory:  Recent Labs Lab 02/25/15 0429 02/26/15 0353 02/27/15 0531  WBC 5.3 5.4 5.1  HGB 9.4* 9.5* 10.4*  HCT 30.3* 30.5* 32.5*  PLT 120* 128* 82*    Recent Labs Lab 02/23/15 1814 02/24/15 0200 02/25/15 0429 02/26/15 0353 02/27/15 0531  NA 134* 134* 136 136 133*  K 4.3 4.0 4.1 4.2 4.4  CL 93* 93* 98* 101 95*  CO2 27 30 27 26 27   BUN 46* 48* 36* 64* 47*  CREATININE 7.53* 8.15* 5.20* 7.87* 5.85*  CALCIUM 8.1* 7.9* 8.1* 7.7* 7.7*  PROT 7.4 7.3  --   --   --   BILITOT 0.8 0.3  --   --   --   ALKPHOS 37* 36*  --   --   --   ALT 22 22  --   --   --   AST 58* 50*  --   --   --   GLUCOSE 161* 120* 111* 100* 119*  Imaging/Diagnostic Tests: DG orthopantogram: negative for periapical abscess or osseous abnormality  Sela Hua, MD 02/27/2015, 7:04 AM PGY-1, [place] Intern pager: [phone], text pages welcome

## 2015-02-28 LAB — CULTURE, BLOOD (ROUTINE X 2): CULTURE: NO GROWTH

## 2015-03-03 ENCOUNTER — Inpatient Hospital Stay: Payer: BC Managed Care – PPO | Admitting: Family Medicine

## 2015-03-09 ENCOUNTER — Ambulatory Visit (INDEPENDENT_AMBULATORY_CARE_PROVIDER_SITE_OTHER): Payer: BC Managed Care – PPO | Admitting: Family Medicine

## 2015-03-09 ENCOUNTER — Encounter: Payer: Self-pay | Admitting: Family Medicine

## 2015-03-09 VITALS — BP 82/50 | HR 122 | Temp 98.0°F | Ht 73.0 in | Wt 114.0 lb

## 2015-03-09 DIAGNOSIS — R5382 Chronic fatigue, unspecified: Secondary | ICD-10-CM | POA: Diagnosis not present

## 2015-03-09 DIAGNOSIS — B20 Human immunodeficiency virus [HIV] disease: Secondary | ICD-10-CM | POA: Diagnosis not present

## 2015-03-09 MED ORDER — PREDNISONE 10 MG PO TABS: 15.0000 mg | ORAL_TABLET | Freq: Every day | ORAL | Status: DC

## 2015-03-09 NOTE — Patient Instructions (Signed)
Thank you for coming to see me today. It was a pleasure. Today we talked about:   Fatigue: I want you to take the prednisone as prescribed.  Disease prophylaxis: I will call your infectious disease doctors to discuss what antibiotic medication to start you on.  Please make an appointment to see me in 2 weeks for follow-up of fatigue and HIV.  If you have any questions or concerns, please do not hesitate to call the office at [phone].  Sincerely,  Cordelia Poche, MD

## 2015-03-09 NOTE — Progress Notes (Signed)
    Subjective    Kenneth [name] is a 59 y.o. male that presents for a follow-up visit for chronic issues.   1. Hospital follow-up: patient was admitted for suspected sepsis. He was found to have no source of infection and no evidence of infection. He was started on prednisone for a lupus flare. He states to me that he has not been taking any of his medication, including HIV medication. While in the hospital he was "feeling fine" and states that his pain and was improved.  Social History  Substance Use Topics  . Smoking status: Never Smoker   . Smokeless tobacco: Never Used  . Alcohol Use: No    Allergies  Allergen Reactions  . Amoxicillin Rash and Other (See Comments)    REACTION: diffuse rash    No orders of the defined types were placed in this encounter.    ROS  Per HPI   Objective   BP 82/50 mmHg  Pulse 122  Temp(Src) 98 F (36.7 C) (Oral)  Ht 6\' 1"  (1.854 m)  Wt 114 lb (51.71 kg)  BMI 15.04 kg/m2  General: Chronically ill appearing, no distress  Assessment and Plan    Chronic fatigue Patient's fatigue appears multifactorial. Patient is currently not taking any of his medications. He appears depressed but does not think so himself. He states he just wants to have energy. Discussed restarting prednisone as his lupus probably is contributing to this as well. Prednisone will also hopefully give him some feeling of energy. Recommended to take other medication as prescribed. To follow-up in 2 weeks as I am a little worried about him.  Called patient at 6:05pm and 6:09pm on 03/08/2015 to discuss blood pressure. No answer. Voicemail left for patient to return call  Called patient at 3:39pm and 3:40pm on 03/09/2015 to discuss blood pressure with no answer. Sen[place] and spoke with nurse who confirmed patient was present for dialysis. Blood pressure in 80s-90s. She stated either a NP or Physician was to evaluate him.

## 2015-03-11 DIAGNOSIS — R5382 Chronic fatigue, unspecified: Secondary | ICD-10-CM | POA: Insufficient documentation

## 2015-03-11 NOTE — Assessment & Plan Note (Signed)
Patient is not adherent with therapy. Discussed importance of continuing therapy. Patient understands but contributes fatigue to preventing him from taking his medications as prescribed. Discussed possibility of antibiotic prophylaxis. On review, appears infectious disease attributes CD4 count to possible error. Will have patient follow-up with infectious disease for this. Reiterated importance of adherence

## 2015-03-11 NOTE — Assessment & Plan Note (Signed)
Patient's fatigue appears multifactorial. Patient is currently not taking any of his medications. He appears depressed but does not think so himself. He states he just wants to have energy. Discussed restarting prednisone as his lupus probably is contributing to this as well. Prednisone will also hopefully give him some feeling of energy. Recommended to take other medication as prescribed. To follow-up in 2 weeks as I am a little worried about him.

## 2015-03-19 NOTE — ED Provider Notes (Signed)
CSN: [id number]     Arrival date & time 02/23/15  1649 History   First MD Initiated Contact with Patient 02/23/15 1709     Chief Complaint  Patient presents with  . Weakness      HPI Patient was sent here from infectious disease office after being seen in the emergency department yesterday and discharge.  He followed up with infectious disease physician who sent him here because of fever.  Patient has history of HIV.  Has had general weakness and fatigue. Past Medical History  Diagnosis Date  . HIV infection ([place]) dx ~ 2009    a. 02/13/2014 CD4 = 240 - undetectable viral load.  . Hyperlipidemia   . Hypertension   . Insomnia   . Dysphagia 2008    post heller myotomy/toupee fundoplication.    . Achalasia   . Renal abscess   . Hemorrhoids, internal   . Premature ventricular contractions   . Pancytopenia ([place])   . CKD (chronic kidney disease), stage II   . Lupus nephritis ([place])   . SLE (systemic lupus erythematosus) ([place]) dx'd 2015  . Candida infection, esophageal ([place]) 08/2010    a. 2012 - noted on EGD.  Marland Kitchen DVT (deep venous thrombosis) ([place]) ~ 04/2014  . History of blood transfusion 03/2014    "low counts"  . History of stomach ulcers    Past Surgical History  Procedure Laterality Date  . Nephrectomy Right ~ 2011  . Colonoscopy  2008    .  tortuous colon, internal hemorrhoids.  no polyps or diverticulosis.   . Esophagogastroduodenoscopy  08/2010    Dr [name].  08/2010 candida esophagitis, no obvious esophageal stricture. 02/2006 and 05/2006 dilated esophagus, narrowed distal esophagus but no stricture, was empirically Savary dilated  . Heller myotomy  1999    with toupee fundoplication of HH.   Marland Kitchen Esophagogastroduodenoscopy N/A 06/27/2014    Procedure: ESOPHAGOGASTRODUODENOSCOPY (EGD);  Surgeon: [name], MD;  Location: [facility] ENDOSCOPY;  Service: Endoscopy;  Laterality: N/A;  . Bascilic vein transposition Left 06/29/2014    Procedure: FIRST STAGE  [place];   Surgeon: Rosetta Posner, MD;  Location: [place];  Service: Vascular;  Laterality: Left;  . Insertion of dialysis catheter Right 06/29/2014    Procedure: INSERTION OF DIALYSIS CATHETER RIGHT IJ;  Surgeon: Rosetta Posner, MD;  Location: [facility] OR;  Service: Vascular;  Laterality: Right;  . Esophagogastroduodenoscopy N/A 07/02/2014    Procedure: ESOPHAGOGASTRODUODENOSCOPY (EGD);  Surgeon: [name], MD;  Location: [facility] ENDOSCOPY;  Service: Endoscopy;  Laterality: N/A;  with botox  . Bascilic vein transposition Left 09/11/2014    Procedure: SECOND STAGE BASILIC VEIN TRANSPOSITION LEFT UPPER ARM;  Surgeon: Rosetta Posner, MD;  Location: [facility] OR;  Service: Vascular;  Laterality: Left;   Family History  Problem Relation Age of Onset  . Colon cancer Father     deceased  . Other Mother     s/p pacemaker - alive and well.  . Hypertension Mother   . Other      5 brothers, 3 sisters - alive and well.   Social History  Substance Use Topics  . Smoking status: Never Smoker   . Smokeless tobacco: Never Used  . Alcohol Use: No    Review of Systems  All other systems reviewed and are negative  Allergies  Amoxicillin  Home Medications   Prior to Admission medications   Medication Sig Start Date End Date Taking? Authorizing Provider  abacavir (ZIAGEN) 300 MG tablet Take  2 tablets (600 mg total) by mouth daily. 11/24/14  Yes Thayer Headings, MD  calcium acetate (PHOSLO) 667 MG capsule Take 2 capsules (1,334 mg total) by mouth 3 (three) times daily with meals. 07/06/14  Yes  N Rumley, DO  dolutegravir (TIVICAY) 50 MG tablet Take 1 tablet (50 mg total) by mouth daily. 10/01/14  Yes Campbell Riches, MD  lamiVUDine (EPIVIR) 10 MG/ML solution Take 5 mLs (50 mg total) by mouth daily. 10/01/14  Yes Campbell Riches, MD  mirtazapine (REMERON) 15 MG tablet Take 1 tablet (15 mg total) by mouth at bedtime. 02/18/15  Yes Mariel Aloe, MD  Multiple Vitamin (MULTIVITAMIN WITH MINERALS) TABS tablet Take 1 tablet by mouth  daily.   Yes Historical Provider, MD  oxyCODONE-acetaminophen (PERCOCET/ROXICET) 5-325 MG tablet Take 2 tablets by mouth every 4 (four) hours as needed for severe pain. 02/22/15   Charlesetta Shanks, MD  predniSONE (DELTASONE) 10 MG tablet Take 1.5 tablets (15 mg total) by mouth daily with breakfast. 03/09/15   Mariel Aloe, MD   BP 138/85 mmHg  Pulse 117  Temp(Src) 98.9 F (37.2 C) (Oral)  Resp 18  Ht 6\' 1"  (1.854 m)  Wt 116 lb 13.5 oz (53 kg)  BMI 15.42 kg/m2  SpO2 97% Physical Exam Physical Exam  Nursing note and vitals reviewed. Constitutional: He is oriented to person, place, and time. He appears well-developed and well-nourished. No distress.  patient does appear to be generally weak and fatigued. HENT:  Head: Normocephalic and atraumatic.  Eyes: Pupils are equal, round, and reactive to light.  Neck: Normal range of motion.  Cardiovascular: Normal rate and intact distal pulses.   Pulmonary/Chest: No respiratory distress.  Abdominal: Normal appearance. He exhibits no distension.  Soft with no tenderness to palpation.  No rebound or guarding tenderness.   Musculoskeletal: Normal range of motion.  Neurological: He is alert and oriented to person, place, and time. No cranial nerve deficit.  Skin: Skin is warm and dry. No rash noted.  Psychiatric: He has a normal mood and affect. His behavior is normal.   ED Course  Procedures (including critical care time) Labs Review Labs Reviewed  COMPREHENSIVE METABOLIC PANEL - Abnormal; Notable for the following:    Sodium 134 (*)    Chloride 93 (*)    Glucose, Bld 161 (*)    BUN 46 (*)    Creatinine, Ser 7.53 (*)    Calcium 8.1 (*)    Albumin 2.3 (*)    AST 58 (*)    Alkaline Phosphatase 37 (*)    GFR calc non Af Amer 7 (*)    GFR calc Af Amer 8 (*)    All other components within normal limits  CBC WITH DIFFERENTIAL/PLATELET - Abnormal; Notable for the following:    RBC 3.31 (*)    Hemoglobin 10.3 (*)    HCT 31.7 (*)    Lymphs  Abs 0.6 (*)    All other components within normal limits  CK - Abnormal; Notable for the following:    Total CK 444 (*)    All other components within normal limits  COMPREHENSIVE METABOLIC PANEL - Abnormal; Notable for the following:    Sodium 134 (*)    Chloride 93 (*)    Glucose, Bld 120 (*)    BUN 48 (*)    Creatinine, Ser 8.15 (*)    Calcium 7.9 (*)    Albumin 2.3 (*)    AST 50 (*)  Alkaline Phosphatase 36 (*)    GFR calc non Af Amer 6 (*)    GFR calc Af Amer 7 (*)    All other components within normal limits  CBC WITH DIFFERENTIAL/PLATELET - Abnormal; Notable for the following:    RBC 3.14 (*)    Hemoglobin 9.5 (*)    HCT 29.9 (*)    Lymphs Abs 0.6 (*)    All other components within normal limits  CK - Abnormal; Notable for the following:    Total CK 438 (*)    All other components within normal limits  C-REACTIVE PROTEIN - Abnormal; Notable for the following:    CRP 18.0 (*)    All other components within normal limits  T-HELPER CELLS (CD4) COUNT (NOT AT [facility]) - Abnormal; Notable for the following:    CD4 T Cell Abs 70 (*)    CD4 % Helper T Cell 21 (*)    All other components within normal limits  GLUCOSE, CAPILLARY - Abnormal; Notable for the following:    Glucose-Capillary 103 (*)    All other components within normal limits  CBC WITH DIFFERENTIAL/PLATELET - Abnormal; Notable for the following:    RBC 3.14 (*)    Hemoglobin 9.4 (*)    HCT 30.3 (*)    Platelets 120 (*)    All other components within normal limits  RENAL FUNCTION PANEL - Abnormal; Notable for the following:    Chloride 98 (*)    Glucose, Bld 111 (*)    BUN 36 (*)    Creatinine, Ser 5.20 (*)    Calcium 8.1 (*)    Albumin 2.0 (*)    GFR calc non Af Amer 11 (*)    GFR calc Af Amer 13 (*)    All other components within normal limits  CBC WITH DIFFERENTIAL/PLATELET - Abnormal; Notable for the following:    RBC 3.19 (*)    Hemoglobin 9.5 (*)    HCT 30.5 (*)    Platelets 128 (*)    All  other components within normal limits  RENAL FUNCTION PANEL - Abnormal; Notable for the following:    Glucose, Bld 100 (*)    BUN 64 (*)    Creatinine, Ser 7.87 (*)    Calcium 7.7 (*)    Albumin 2.0 (*)    GFR calc non Af Amer 7 (*)    GFR calc Af Amer 8 (*)    All other components within normal limits  CBC - Abnormal; Notable for the following:    RBC 3.40 (*)    Hemoglobin 10.4 (*)    HCT 32.5 (*)    Platelets 82 (*)    All other components within normal limits  BASIC METABOLIC PANEL - Abnormal; Notable for the following:    Sodium 133 (*)    Chloride 95 (*)    Glucose, Bld 119 (*)    BUN 47 (*)    Creatinine, Ser 5.85 (*)    Calcium 7.7 (*)    GFR calc non Af Amer 10 (*)    GFR calc Af Amer 11 (*)    All other components within normal limits  CULTURE, BLOOD (ROUTINE X 2)  CULTURE, BLOOD (ROUTINE X 2)  LACTIC ACID, PLASMA  LACTIC ACID, PLASMA  HIV 1 RNA QUANT-NO REFLEX-BLD  HEPATITIS B SURFACE ANTIGEN  TSH  CBC WITH DIFFERENTIAL/PLATELET  GC/CHLAMYDIA PROBE AMP ([place]) NOT AT [facility]  GC/CHLAMYDIA PROBE AMP ([place]) NOT AT [facility]    Imaging Review  DG Chest 2 View (Final result) Result time: 02/23/15 18:28:11   Final result by Rad Results In Interface (02/23/15 18:28:11)   Narrative:   CLINICAL DATA: Patient weakness for 2 weeks  EXAM: CHEST 2 VIEW  COMPARISON: Chest radiograph 07/03/2014.  FINDINGS: Normal cardiac and mediastinal contours. No consolidative pulmonary opacities. No pleural effusion or pneumothorax. Upper abdominal surgical clips. Thoracic spine degenerative changes. Rightward curvature of the thoracic spine.  IMPRESSION: No acute cardiopulmonary process.   Electronically Signed By: Lovey Newcomer M.D. On: 02/23/2015 18:28      MDM   Final diagnoses:  Weakness        Leonard Schwartz, MD 03/19/15 [phone]

## 2015-03-24 ENCOUNTER — Other Ambulatory Visit: Payer: Self-pay | Admitting: *Deleted

## 2015-03-25 MED ORDER — ATORVASTATIN CALCIUM 40 MG PO TABS: 40.0000 mg | ORAL_TABLET | Freq: Every day | ORAL | Status: DC

## 2015-04-13 ENCOUNTER — Other Ambulatory Visit: Payer: Self-pay | Admitting: *Deleted

## 2015-04-13 DIAGNOSIS — B20 Human immunodeficiency virus [HIV] disease: Secondary | ICD-10-CM

## 2015-04-13 MED ORDER — DOLUTEGRAVIR SODIUM 50 MG PO TABS: 50.0000 mg | ORAL_TABLET | Freq: Every day | ORAL | Status: DC

## 2015-04-13 MED ORDER — LAMIVUDINE 10 MG/ML PO SOLN: 50.0000 mg | Freq: Every day | ORAL | Status: DC

## 2015-04-13 MED ORDER — ABACAVIR SULFATE 300 MG PO TABS: 600.0000 mg | ORAL_TABLET | Freq: Every day | ORAL | Status: DC

## 2015-04-22 ENCOUNTER — Other Ambulatory Visit: Payer: Self-pay | Admitting: Family Medicine

## 2015-04-26 ENCOUNTER — Other Ambulatory Visit: Payer: Self-pay | Admitting: Family Medicine

## 2015-04-26 NOTE — Telephone Encounter (Signed)
Pt called to check the status of his request for Prednisone. He doesn't see his Rheumatologist until next month but our office has always filled this prescription. Please let patient know what the next step is. Blima Rich

## 2015-04-26 NOTE — Telephone Encounter (Signed)
Patient should be getting this from his Rheumatologist. Please confirm that he is being followed by his rheumatologist with management of chronic steroids.

## 2015-05-20 ENCOUNTER — Encounter: Payer: Self-pay | Admitting: Infectious Diseases

## 2015-05-24 ENCOUNTER — Other Ambulatory Visit: Payer: Self-pay | Admitting: *Deleted

## 2015-05-24 DIAGNOSIS — B20 Human immunodeficiency virus [HIV] disease: Secondary | ICD-10-CM

## 2015-05-24 MED ORDER — DOLUTEGRAVIR SODIUM 50 MG PO TABS: 50.0000 mg | ORAL_TABLET | Freq: Every day | ORAL | Status: DC

## 2015-05-24 MED ORDER — LAMIVUDINE 10 MG/ML PO SOLN: 50.0000 mg | Freq: Every day | ORAL | Status: DC

## 2015-05-24 MED ORDER — ABACAVIR SULFATE 300 MG PO TABS: 600.0000 mg | ORAL_TABLET | Freq: Every day | ORAL | Status: DC

## 2015-05-25 ENCOUNTER — Encounter: Payer: Self-pay | Admitting: Infectious Diseases

## 2015-06-24 ENCOUNTER — Ambulatory Visit (INDEPENDENT_AMBULATORY_CARE_PROVIDER_SITE_OTHER): Payer: BC Managed Care – PPO | Admitting: Family Medicine

## 2015-06-24 ENCOUNTER — Encounter: Payer: Self-pay | Admitting: Family Medicine

## 2015-06-24 VITALS — BP 118/68 | HR 101 | Temp 97.8°F | Ht 72.0 in | Wt 128.4 lb

## 2015-06-24 DIAGNOSIS — R195 Other fecal abnormalities: Secondary | ICD-10-CM | POA: Diagnosis not present

## 2015-06-24 LAB — POCT HEMOGLOBIN: Hemoglobin: 12.1 g/dL — AB (ref 14.1–18.1)

## 2015-06-24 LAB — HEMOCCULT GUIAC POC 1CARD (OFFICE): Fecal Occult Blood, POC: NEGATIVE

## 2015-06-24 NOTE — Progress Notes (Signed)
    Subjective    Kenneth [name] is a 60 y.o. male that presents for a follow-up visit for:   1. Black stools: Symptoms started 5 days ago. He has had intermittent bowel movements but each one has been dark. No history of hematochezia. No emesis. He has had one episode of lightheadedness about two weeks ago, but otherwise has not had any symptoms of lightheadedness, dizziness, chest palpitations. He has used Pepto bismol once in the past three weeks. He has also started a new medication given by his nephrologist but he is unsure of its name.  Social History  Substance Use Topics  . Smoking status: Never Smoker   . Smokeless tobacco: Never Used  . Alcohol Use: No    Allergies  Allergen Reactions  . Amoxicillin Rash and Other (See Comments)    REACTION: diffuse rash    No orders of the defined types were placed in this encounter.    ROS  Per HPI   Objective   BP 118/68 mmHg  Pulse 101  Temp(Src) 97.8 F (36.6 C) (Oral)  Ht 6' (1.829 m)  Wt 128 lb 6.4 oz (58.242 kg)  BMI 17.41 kg/m2  SpO2 99%  Vital signs reviewed  General: Well appearing, no distress Gastrointestinal: Rectal exam significant for stool in the rectum. Stool is dark green. No visible blood. No hemorrhoids  Assessment and Plan    1. Dark stools Hemoccult negative. Hemoglobin stable. Doubt this is related to Pepto-bismol. Possible that patient was started on iron tablets. Patient to go home and call me with the name of the medication. If not likely that this could turn his stool dark, will refer to GI for colonoscopy/EGD. - Hemoccult - 1 Card (office) - POCT hemoglobin

## 2015-06-24 NOTE — Patient Instructions (Signed)
Thank you for coming to see me today. It was a pleasure. Today we talked about:   Dark stools: your test was negative for blood in the office. I am checking our blood count. Please call with the name of the medication your doctor gave you. If it does not explain your stools, I will refer you to the gastroenterologist.  If you have any questions or concerns, please do not hesitate to call the office at [phone].  Sincerely,  Cordelia Poche, MD

## 2015-06-27 ENCOUNTER — Encounter: Payer: Self-pay | Admitting: Family Medicine

## 2015-09-02 ENCOUNTER — Other Ambulatory Visit: Payer: BC Managed Care – PPO

## 2015-09-02 DIAGNOSIS — B20 Human immunodeficiency virus [HIV] disease: Secondary | ICD-10-CM

## 2015-09-02 DIAGNOSIS — Z113 Encounter for screening for infections with a predominantly sexual mode of transmission: Secondary | ICD-10-CM

## 2015-09-02 LAB — COMPLETE METABOLIC PANEL WITH GFR
ALT: 11 U/L (ref 9–46)
AST: 11 U/L (ref 10–35)
Albumin: 4.1 g/dL (ref 3.6–5.1)
Alkaline Phosphatase: 75 U/L (ref 40–115)
BUN: 28 mg/dL — AB (ref 7–25)
CALCIUM: 9.4 mg/dL (ref 8.6–10.3)
CHLORIDE: 93 mmol/L — AB (ref 98–110)
CO2: 30 mmol/L (ref 20–31)
CREATININE: 7.25 mg/dL — AB (ref 0.70–1.33)
GFR, Est African American: 9 mL/min — ABNORMAL LOW (ref >=60)
GFR, Est Non African American: 8 mL/min — ABNORMAL LOW (ref >=60)
GLUCOSE: 77 mg/dL (ref 65–99)
Potassium: 4.9 mmol/L (ref 3.5–5.3)
SODIUM: 138 mmol/L (ref 135–146)
Total Bilirubin: 0.4 mg/dL (ref 0.2–1.2)
Total Protein: 8.2 g/dL — ABNORMAL HIGH (ref 6.1–8.1)

## 2015-09-02 LAB — CBC WITH DIFFERENTIAL/PLATELET
BASOS PCT: 1 %
Basophils Absolute: 56 cells/uL (ref 0–200)
EOS ABS: 56 {cells}/uL (ref 15–500)
EOS PCT: 1 %
HCT: 44.7 % (ref 38.5–50.0)
Hemoglobin: 15.3 g/dL (ref 13.2–17.1)
Lymphocytes Relative: 23 %
Lymphs Abs: 1288 cells/uL (ref 850–3900)
MCH: 34.9 pg — ABNORMAL HIGH (ref 27.0–33.0)
MCHC: 34.2 g/dL (ref 32.0–36.0)
MCV: 101.8 fL — ABNORMAL HIGH (ref 80.0–100.0)
MONOS PCT: 8 %
MPV: 11.3 fL (ref 7.5–12.5)
Monocytes Absolute: 448 cells/uL (ref 200–950)
NEUTROS ABS: 3752 {cells}/uL (ref 1500–7800)
Neutrophils Relative %: 67 %
PLATELETS: 141 10*3/uL (ref 140–400)
RBC: 4.39 MIL/uL (ref 4.20–5.80)
RDW: 16.5 % — ABNORMAL HIGH (ref 11.0–15.0)
WBC: 5.6 10*3/uL (ref 3.8–10.8)

## 2015-09-03 LAB — T-HELPER CELL (CD4) - (RCID CLINIC ONLY)
CD4 T CELL ABS: 420 /uL (ref 400–2700)
CD4 T CELL HELPER: 25 % — AB (ref 33–55)

## 2015-09-03 LAB — RPR

## 2015-09-06 LAB — HIV-1 RNA QUANT-NO REFLEX-BLD
HIV 1 RNA Quant: <20 copies/mL (ref <20)
HIV-1 RNA Quant, Log: <1.3 Log copies/mL (ref <1.30)

## 2015-09-16 ENCOUNTER — Ambulatory Visit: Payer: BC Managed Care – PPO | Admitting: *Deleted

## 2015-09-16 ENCOUNTER — Ambulatory Visit (INDEPENDENT_AMBULATORY_CARE_PROVIDER_SITE_OTHER): Payer: BC Managed Care – PPO | Admitting: Infectious Diseases

## 2015-09-16 VITALS — BP 107/72 | HR 98 | Temp 97.9°F | Ht 73.0 in | Wt 134.0 lb

## 2015-09-16 DIAGNOSIS — F32A Depression, unspecified: Secondary | ICD-10-CM

## 2015-09-16 DIAGNOSIS — F329 Major depressive disorder, single episode, unspecified: Secondary | ICD-10-CM

## 2015-09-16 DIAGNOSIS — N186 End stage renal disease: Secondary | ICD-10-CM | POA: Diagnosis not present

## 2015-09-16 DIAGNOSIS — I10 Essential (primary) hypertension: Secondary | ICD-10-CM

## 2015-09-16 DIAGNOSIS — B20 Human immunodeficiency virus [HIV] disease: Secondary | ICD-10-CM | POA: Diagnosis not present

## 2015-09-16 NOTE — Assessment & Plan Note (Signed)
Bp well controlled today.  Asx.

## 2015-09-16 NOTE — Assessment & Plan Note (Signed)
Tolerating HD well.  avf is healthy

## 2015-09-16 NOTE — Assessment & Plan Note (Signed)
He si doing well Will continue to follow his labs.  Offered him PNVX booster today, he wishes to defer to 60yo  Will give him PCV13 and PNVX 23 then.  Offered/refused condoms.  rtc 6 months.

## 2015-09-16 NOTE — BH Specialist Note (Signed)
Counselor met with Kenneth [name] today in the exam room as a warm handoff.  Patient was oriented times four with good affect and dress.  Patient was alert and talkative.  Patient was in good spirits and freely shared with counselor.  Patient denied any substance use but did say he might need to occasionally talk with counselor about his fluctuating moods.  Counselor provided support and encouragement with patient and recommended he make an appointment with counselor when he checked out today.   Rolena Infante, MA, LPC [facility]

## 2015-09-16 NOTE — Progress Notes (Signed)
   Subjective:    Patient ID: [name], male    DOB: [birth year], 60 y.o.   MRN: [id number]  HPI  60 yo M with HIV/AIDS on DTGV/3TV/ABC. He is also ESRD. He was seen by [facility] June 2016 and had AVF. He had to have AVF declotted in the last month.  [place], [place] No problems with ART. Bas been feeling well.  No problems with HD, states BP has been low. Wt has been steady  HIV 1 RNA QUANT (copies/mL)  Date Value  09/02/2015 <20  02/24/2015 <20  10/01/2014 <20   CD4 T CELL ABS (/uL)  Date Value  09/02/2015 420  02/24/2015 70*  10/01/2014 400     Review of Systems  Constitutional: Negative for appetite change and unexpected weight change.  Respiratory: Negative for cough and shortness of breath.   Cardiovascular: Negative for chest pain.  Gastrointestinal: Negative for diarrhea and constipation.  Neurological: Negative for headaches.       Objective:   Physical Exam  Constitutional: He appears well-developed and well-nourished.  HENT:  Mouth/Throat: No oropharyngeal exudate.  Eyes: EOM are normal. Pupils are equal, round, and reactive to light.  Neck: Neck supple.  Cardiovascular: Normal rate, regular rhythm and normal heart sounds.   Pulmonary/Chest: Effort normal and breath sounds normal.  Abdominal: Soft. Bowel sounds are normal. There is no tenderness. There is no rebound.  Musculoskeletal: He exhibits no edema.       Arms: Lymphadenopathy:    He has no cervical adenopathy.       Assessment & Plan:

## 2015-10-20 ENCOUNTER — Other Ambulatory Visit: Payer: Self-pay | Admitting: Infectious Diseases

## 2015-11-08 ENCOUNTER — Other Ambulatory Visit: Payer: Self-pay

## 2015-11-08 ENCOUNTER — Other Ambulatory Visit: Payer: Self-pay | Admitting: Infectious Diseases

## 2015-11-08 DIAGNOSIS — B20 Human immunodeficiency virus [HIV] disease: Secondary | ICD-10-CM

## 2015-11-08 MED ORDER — ABACAVIR SULFATE 300 MG PO TABS: 600.0000 mg | ORAL_TABLET | Freq: Every day | ORAL | 5 refills | Status: DC

## 2015-11-08 MED ORDER — LAMIVUDINE 10 MG/ML PO SOLN: 50.0000 mg | Freq: Every day | ORAL | 5 refills | Status: DC

## 2015-11-08 MED ORDER — DOLUTEGRAVIR SODIUM 50 MG PO TABS: ORAL_TABLET | ORAL | 5 refills | Status: DC

## 2015-11-19 ENCOUNTER — Other Ambulatory Visit: Payer: Self-pay | Admitting: Infectious Diseases

## 2015-11-19 DIAGNOSIS — B20 Human immunodeficiency virus [HIV] disease: Secondary | ICD-10-CM

## 2016-05-16 ENCOUNTER — Other Ambulatory Visit: Payer: BC Managed Care – PPO

## 2016-05-16 DIAGNOSIS — Z79899 Other long term (current) drug therapy: Secondary | ICD-10-CM

## 2016-05-16 DIAGNOSIS — Z113 Encounter for screening for infections with a predominantly sexual mode of transmission: Secondary | ICD-10-CM

## 2016-05-16 DIAGNOSIS — B20 Human immunodeficiency virus [HIV] disease: Secondary | ICD-10-CM

## 2016-05-16 LAB — LIPID PANEL
CHOL/HDL RATIO: 6.8 ratio — AB (ref <5.0)
CHOLESTEROL: 224 mg/dL — AB (ref <200)
HDL: 33 mg/dL — AB (ref >40)
LDL Cholesterol: 145 mg/dL — ABNORMAL HIGH (ref <100)
TRIGLYCERIDES: 229 mg/dL — AB (ref <150)
VLDL: 46 mg/dL — ABNORMAL HIGH (ref <30)

## 2016-05-16 LAB — CBC WITH DIFFERENTIAL/PLATELET
Basophils Absolute: 0 cells/uL (ref 0–200)
Basophils Relative: 0 %
EOS ABS: 0 {cells}/uL — AB (ref 15–500)
Eosinophils Relative: 0 %
HCT: 46 % (ref 38.5–50.0)
Hemoglobin: 15.5 g/dL (ref 13.2–17.1)
LYMPHS PCT: 12 %
Lymphs Abs: 1284 cells/uL (ref 850–3900)
MCH: 33.8 pg — AB (ref 27.0–33.0)
MCHC: 33.7 g/dL (ref 32.0–36.0)
MCV: 100.4 fL — AB (ref 80.0–100.0)
MONOS PCT: 11 %
MPV: 10.9 fL (ref 7.5–12.5)
Monocytes Absolute: 1177 cells/uL — ABNORMAL HIGH (ref 200–950)
Neutro Abs: 8239 cells/uL — ABNORMAL HIGH (ref 1500–7800)
Neutrophils Relative %: 77 %
PLATELETS: 97 10*3/uL — AB (ref 140–400)
RBC: 4.58 MIL/uL (ref 4.20–5.80)
RDW: 15 % (ref 11.0–15.0)
WBC: 10.7 10*3/uL (ref 3.8–10.8)

## 2016-05-16 LAB — COMPLETE METABOLIC PANEL WITH GFR
ALT: 15 U/L (ref 9–46)
AST: 18 U/L (ref 10–35)
Albumin: 3.9 g/dL (ref 3.6–5.1)
Alkaline Phosphatase: 80 U/L (ref 40–115)
BILIRUBIN TOTAL: 0.4 mg/dL (ref 0.2–1.2)
BUN: 34 mg/dL — ABNORMAL HIGH (ref 7–25)
CHLORIDE: 94 mmol/L — AB (ref 98–110)
CO2: 32 mmol/L — AB (ref 20–31)
Calcium: 9.3 mg/dL (ref 8.6–10.3)
Creat: 7.94 mg/dL — ABNORMAL HIGH (ref 0.70–1.33)
GFR, EST AFRICAN AMERICAN: 8 mL/min — AB (ref >=60)
GFR, EST NON AFRICAN AMERICAN: 7 mL/min — AB (ref >=60)
Glucose, Bld: 94 mg/dL (ref 65–99)
Potassium: 5 mmol/L (ref 3.5–5.3)
Sodium: 139 mmol/L (ref 135–146)
Total Protein: 8.2 g/dL — ABNORMAL HIGH (ref 6.1–8.1)

## 2016-05-17 LAB — RPR

## 2016-05-17 LAB — T-HELPER CELL (CD4) - (RCID CLINIC ONLY)
CD4 % Helper T Cell: 21 % — ABNORMAL LOW (ref 33–55)
CD4 T Cell Abs: 320 /uL — ABNORMAL LOW (ref 400–2700)

## 2016-05-22 LAB — HIV-1 RNA QUANT-NO REFLEX-BLD
HIV 1 RNA Quant: <20 copies/mL
HIV-1 RNA QUANT, LOG: NOT DETECTED {Log_copies}/mL

## 2016-06-02 ENCOUNTER — Other Ambulatory Visit: Payer: Self-pay | Admitting: Nephrology

## 2016-06-02 DIAGNOSIS — Z01818 Encounter for other preprocedural examination: Secondary | ICD-10-CM

## 2016-06-07 ENCOUNTER — Ambulatory Visit (INDEPENDENT_AMBULATORY_CARE_PROVIDER_SITE_OTHER): Payer: BC Managed Care – PPO | Admitting: Infectious Diseases

## 2016-06-07 ENCOUNTER — Encounter: Payer: Self-pay | Admitting: Infectious Diseases

## 2016-06-07 VITALS — BP 113/68 | HR 91 | Temp 97.8°F | Ht 73.0 in | Wt 147.0 lb

## 2016-06-07 DIAGNOSIS — B20 Human immunodeficiency virus [HIV] disease: Secondary | ICD-10-CM | POA: Diagnosis not present

## 2016-06-07 DIAGNOSIS — N186 End stage renal disease: Secondary | ICD-10-CM | POA: Diagnosis not present

## 2016-06-07 DIAGNOSIS — E782 Mixed hyperlipidemia: Secondary | ICD-10-CM

## 2016-06-07 DIAGNOSIS — I10 Essential (primary) hypertension: Secondary | ICD-10-CM | POA: Diagnosis not present

## 2016-06-07 NOTE — Assessment & Plan Note (Signed)
He is doing well His CD4 dropped somewhat, still undetectable. I explained this to him He has gotten flu shot.  Offered/refused condoms.  Will see him back in 6 months.

## 2016-06-07 NOTE — Assessment & Plan Note (Signed)
Doing well Has HD f/u Has txp eval this month.

## 2016-06-07 NOTE — Progress Notes (Signed)
   Subjective:    Patient ID: Kenneth [name], male    DOB: Oct 09, 1955, 61 y.o.   MRN: [id number]  HPI 61 yo M with HIV/AIDS on DTGV/3TV/ABC. He is also ESRD. He was seen by [facility] June 2016 and had AVF. He is being eval this month for renal txp at [facility]. Has been feeling well.  No problems with HD or his fistula   HIV 1 RNA Quant (copies/mL)  Date Value  05/16/2016 <20 NOT DETECTED  09/02/2015 <20  02/24/2015 <20   CD4 T Cell Abs (/uL)  Date Value  05/16/2016 320 (L)  09/02/2015 420  02/24/2015 70 (L)    Review of Systems  Constitutional: Negative for appetite change and unexpected weight change.  Respiratory: Negative for shortness of breath.   Cardiovascular: Negative for leg swelling.  Gastrointestinal: Negative for constipation and diarrhea.  anuric.  Please see HPI. 12 point ROS o/w (-)      Objective:   Physical Exam  Constitutional: He appears well-developed and well-nourished.  HENT:  Mouth/Throat: No oropharyngeal exudate.  Eyes: EOM are normal. Pupils are equal, round, and reactive to light.  Neck: Neck supple.  Cardiovascular: Normal rate, regular rhythm and normal heart sounds.   Pulmonary/Chest: Effort normal and breath sounds normal.  Abdominal: Soft. Bowel sounds are normal. There is no tenderness. There is no rebound.  Musculoskeletal: He exhibits no edema.       Arms: Lymphadenopathy:    He has no cervical adenopathy.       Assessment & Plan:

## 2016-06-07 NOTE — Assessment & Plan Note (Signed)
He is not sure why he is off statin.  He will f/u with PCP and renal. Continue to follow

## 2016-06-07 NOTE — Assessment & Plan Note (Signed)
Well controlled today asx

## 2016-06-15 ENCOUNTER — Telehealth (HOSPITAL_COMMUNITY): Payer: Self-pay | Admitting: *Deleted

## 2016-06-15 NOTE — Telephone Encounter (Signed)
Patient given detailed instructions per Stress Test Requisition Sheet for test on 06/22/16 at 7:30.Patient Notified to arrive 30 minutes early, and that it is imperative to arrive on time for appointment to keep from having the test rescheduled.  Patient verbalized understanding. [name]

## 2016-06-15 NOTE — Telephone Encounter (Signed)
Left message on voicemail in reference to upcoming appointment scheduled for 06/22/16. Phone number given for a call back so details instructions can be given. [name]

## 2016-06-19 ENCOUNTER — Other Ambulatory Visit: Payer: Self-pay | Admitting: Infectious Diseases

## 2016-06-19 DIAGNOSIS — B20 Human immunodeficiency virus [HIV] disease: Secondary | ICD-10-CM

## 2016-06-19 MED ORDER — DOLUTEGRAVIR SODIUM 50 MG PO TABS: ORAL_TABLET | ORAL | 5 refills | Status: DC

## 2016-06-19 MED ORDER — LAMIVUDINE 10 MG/ML PO SOLN: 50.0000 mg | Freq: Every day | ORAL | 5 refills | Status: DC

## 2016-06-19 MED ORDER — ABACAVIR SULFATE 300 MG PO TABS: 600.0000 mg | ORAL_TABLET | Freq: Every day | ORAL | 5 refills | Status: DC

## 2016-06-22 ENCOUNTER — Ambulatory Visit (HOSPITAL_COMMUNITY): Payer: BC Managed Care – PPO | Attending: Cardiovascular Disease

## 2016-06-22 ENCOUNTER — Ambulatory Visit (HOSPITAL_COMMUNITY): Payer: BC Managed Care – PPO

## 2016-06-22 DIAGNOSIS — Z01818 Encounter for other preprocedural examination: Secondary | ICD-10-CM | POA: Insufficient documentation

## 2016-06-22 DIAGNOSIS — N189 Chronic kidney disease, unspecified: Secondary | ICD-10-CM | POA: Insufficient documentation

## 2016-06-30 DIAGNOSIS — Z0000 Encounter for general adult medical examination without abnormal findings: Secondary | ICD-10-CM | POA: Insufficient documentation

## 2016-07-05 ENCOUNTER — Encounter: Payer: Self-pay | Admitting: Internal Medicine

## 2016-07-05 ENCOUNTER — Ambulatory Visit (INDEPENDENT_AMBULATORY_CARE_PROVIDER_SITE_OTHER): Payer: BC Managed Care – PPO | Admitting: Internal Medicine

## 2016-07-05 ENCOUNTER — Other Ambulatory Visit: Payer: Self-pay | Admitting: Internal Medicine

## 2016-07-05 DIAGNOSIS — E782 Mixed hyperlipidemia: Secondary | ICD-10-CM | POA: Diagnosis not present

## 2016-07-05 NOTE — Patient Instructions (Addendum)
Your calculated risk of having a stroke or heart attack in the next ten years was 6.0%. I would work on diet and exercise to try to lower your cholesterol numbers further. We will check another cholesterol panel in one year.   Please return in one year for your annual physical exam or sooner if needed.

## 2016-07-05 NOTE — Assessment & Plan Note (Addendum)
10 year ASCVD risk score calculated to be 10%. Discussed option of moderate intensity statin vs. Lifestyle modifications. Patient elected to attempt lifestyle modifications. Plan to re-check lipid panel in one year to re-evaluate.

## 2016-07-05 NOTE — Progress Notes (Signed)
   Subjective:    Kenneth [name] - 61 y.o. male MRN [id number]  Date of birth: 1955-06-29  HPI  [name] is here for annual exam.  HLD: Patent reports that he is not taking a statin medication. He has previously been on statin medication but it is unclear why this was stopped. Denies calf claudication, chest pain, and DOE.   ROS:  Patient reports no  vision/ hearing changes,anorexia, weight change, fever ,adenopathy, persistant / recurrent hoarseness, swallowing issues, chest pain, edema,persistant / recurrent cough, hemoptysis, dyspnea(rest, exertional, paroxysmal nocturnal), gastrointestinal  bleeding (melena, rectal bleeding), abdominal pain, excessive heart burn, GU symptoms(dysuria, hematuria, pyuria, voiding/incontinence  Issues) syncope, focal weakness, severe memory loss, concerning skin lesions, depression, anxiety, abnormal bruising/bleeding, major joint swelling.    Health Maintenance Due  Topic Date Due  . TETANUS/TDAP  11/02/2014  . COLONOSCOPY  06/01/2016    -  reports that he has never smoked. He has never used smokeless tobacco. - Review of Systems: Per HPI. - Past Medical History: Patient Active Problem List   Diagnosis Date Noted  . Chronic fatigue 03/11/2015  . Exacerbation of systemic lupus ([name]) 02/24/2015  . Depressed mood 02/18/2015  . Unintentional weight loss 02/18/2015  . S/P dialysis catheter insertion ([name])   . Thrombocytopenia ([name])   . Dysphagia   . ESRD (end stage renal disease) ([name])   . Anasarca 06/22/2014  . DVT (deep venous thrombosis) ([name]) 06/22/2014  . PVC's (premature ventricular contractions) 03/17/2014  . Diarrhea   . Abdominal pain   . Protein-calorie malnutrition, severe ([name]) 02/11/2014  . 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 Arthralgia of multiple sites, bilateral 02/02/2014  . Joint swelling 02/01/2014  . Bilateral low back pain without sciatica 02/01/2014  . EXTERNAL HEMORRHOIDS WITHOUT MENTION COMP 01/20/2009  . HIV disease ([name])  07/24/2006  . Hyperlipidemia 05/31/2006  . ERECTILE DYSFUNCTION 05/31/2006  . INSOMNIA NOS 05/31/2006   - Medications: reviewed and updated   Objective:   Physical Exam BP 100/80 (BP Location: Right Arm, Patient Position: Sitting, Cuff Size: Normal)   Pulse 87   Temp 97.8 F (36.6 C) (Oral)   Ht 6\' 1"  (1.854 m)   Wt 147 lb 6.4 oz (66.9 kg)   SpO2 98%   BMI 19.45 kg/m  Gen: NAD, alert, cooperative with exam, well-appearing HEENT: NCAT, PERRL, clear conjunctiva, oropharynx clear, supple neck CV: RRR, good S1/S2, no murmur, no edema, capillary refill brisk  Resp: CTABL, no wheezes, non-labored Abd: SNTND, BS present, no guarding or organomegaly Skin: no rashes, normal turgor  Neuro: no gross deficits.  Psych: good insight, alert and oriented    Assessment & Plan:   Hyperlipidemia 10 year ASCVD risk score calculated to be 10%. Discussed option of moderate intensity statin vs. Lifestyle modifications. Patient elected to attempt lifestyle modifications. Plan to re-check lipid panel in one year to re-evaluate.   Health Maintenance:  -patient reports he had colonoscopy done at [name] one week ago and is awaiting results  -Out of tdap for private insurance, patient to make nurse visit    Phill Myron, D.O. 07/05/2016, 10:32 AM PGY-2, [name]

## 2016-11-28 ENCOUNTER — Telehealth: Payer: Self-pay | Admitting: *Deleted

## 2016-11-28 NOTE — Telephone Encounter (Signed)
Pager [phone]  Dr Johnell Comings [facility], Infectious Diseases at [facility].  Calling regarding mutual patient (she saw him in clinic 8/28).  Per Dr. Clearnce Hasten, not urgent, but would like to discuss a few things. Landis Gandy, RN

## 2016-12-05 NOTE — Telephone Encounter (Signed)
Spoke with [facility] Txp ID Will need updated Hep A/B vax, flu, pnvx

## 2017-01-19 ENCOUNTER — Other Ambulatory Visit: Payer: Self-pay | Admitting: Infectious Diseases

## 2017-01-19 DIAGNOSIS — B20 Human immunodeficiency virus [HIV] disease: Secondary | ICD-10-CM

## 2017-01-21 ENCOUNTER — Emergency Department (HOSPITAL_COMMUNITY): Payer: BC Managed Care – PPO

## 2017-01-21 ENCOUNTER — Encounter (HOSPITAL_COMMUNITY): Payer: Self-pay | Admitting: Emergency Medicine

## 2017-01-21 DIAGNOSIS — S42101A Fracture of unspecified part of scapula, right shoulder, initial encounter for closed fracture: Secondary | ICD-10-CM | POA: Diagnosis present

## 2017-01-21 DIAGNOSIS — N2581 Secondary hyperparathyroidism of renal origin: Secondary | ICD-10-CM | POA: Diagnosis present

## 2017-01-21 DIAGNOSIS — Z880 Allergy status to penicillin: Secondary | ICD-10-CM

## 2017-01-21 DIAGNOSIS — S01119A Laceration without foreign body of unspecified eyelid and periocular area, initial encounter: Secondary | ICD-10-CM | POA: Diagnosis present

## 2017-01-21 DIAGNOSIS — Z79899 Other long term (current) drug therapy: Secondary | ICD-10-CM

## 2017-01-21 DIAGNOSIS — R000 Tachycardia, unspecified: Secondary | ICD-10-CM | POA: Diagnosis present

## 2017-01-21 DIAGNOSIS — Z86718 Personal history of other venous thrombosis and embolism: Secondary | ICD-10-CM

## 2017-01-21 DIAGNOSIS — Z7952 Long term (current) use of systemic steroids: Secondary | ICD-10-CM

## 2017-01-21 DIAGNOSIS — K5900 Constipation, unspecified: Secondary | ICD-10-CM | POA: Diagnosis present

## 2017-01-21 DIAGNOSIS — R5082 Postprocedural fever: Secondary | ICD-10-CM | POA: Diagnosis present

## 2017-01-21 DIAGNOSIS — M3214 Glomerular disease in systemic lupus erythematosus: Secondary | ICD-10-CM | POA: Diagnosis present

## 2017-01-21 DIAGNOSIS — S42031A Displaced fracture of lateral end of right clavicle, initial encounter for closed fracture: Secondary | ICD-10-CM | POA: Diagnosis present

## 2017-01-21 DIAGNOSIS — D631 Anemia in chronic kidney disease: Secondary | ICD-10-CM | POA: Diagnosis present

## 2017-01-21 DIAGNOSIS — K219 Gastro-esophageal reflux disease without esophagitis: Secondary | ICD-10-CM | POA: Diagnosis present

## 2017-01-21 DIAGNOSIS — D72829 Elevated white blood cell count, unspecified: Secondary | ICD-10-CM | POA: Diagnosis present

## 2017-01-21 DIAGNOSIS — S82401A Unspecified fracture of shaft of right fibula, initial encounter for closed fracture: Secondary | ICD-10-CM | POA: Diagnosis present

## 2017-01-21 DIAGNOSIS — I120 Hypertensive chronic kidney disease with stage 5 chronic kidney disease or end stage renal disease: Secondary | ICD-10-CM | POA: Diagnosis present

## 2017-01-21 DIAGNOSIS — S0181XA Laceration without foreign body of other part of head, initial encounter: Secondary | ICD-10-CM | POA: Diagnosis present

## 2017-01-21 DIAGNOSIS — S82141A Displaced bicondylar fracture of right tibia, initial encounter for closed fracture: Principal | ICD-10-CM | POA: Diagnosis present

## 2017-01-21 DIAGNOSIS — W109XXA Fall (on) (from) unspecified stairs and steps, initial encounter: Secondary | ICD-10-CM | POA: Diagnosis present

## 2017-01-21 DIAGNOSIS — Z9889 Other specified postprocedural states: Secondary | ICD-10-CM

## 2017-01-21 DIAGNOSIS — Z800 Family history of malignant neoplasm of digestive organs: Secondary | ICD-10-CM

## 2017-01-21 DIAGNOSIS — M810 Age-related osteoporosis without current pathological fracture: Secondary | ICD-10-CM | POA: Diagnosis present

## 2017-01-21 DIAGNOSIS — N186 End stage renal disease: Secondary | ICD-10-CM | POA: Diagnosis present

## 2017-01-21 DIAGNOSIS — Z8249 Family history of ischemic heart disease and other diseases of the circulatory system: Secondary | ICD-10-CM

## 2017-01-21 DIAGNOSIS — S82201A Unspecified fracture of shaft of right tibia, initial encounter for closed fracture: Secondary | ICD-10-CM | POA: Diagnosis present

## 2017-01-21 DIAGNOSIS — E559 Vitamin D deficiency, unspecified: Secondary | ICD-10-CM | POA: Diagnosis present

## 2017-01-21 DIAGNOSIS — B20 Human immunodeficiency virus [HIV] disease: Secondary | ICD-10-CM | POA: Diagnosis present

## 2017-01-21 DIAGNOSIS — S83281A Other tear of lateral meniscus, current injury, right knee, initial encounter: Secondary | ICD-10-CM | POA: Diagnosis present

## 2017-01-21 DIAGNOSIS — Z992 Dependence on renal dialysis: Secondary | ICD-10-CM

## 2017-01-21 DIAGNOSIS — S42143A Displaced fracture of glenoid cavity of scapula, unspecified shoulder, initial encounter for closed fracture: Secondary | ICD-10-CM | POA: Diagnosis present

## 2017-01-21 DIAGNOSIS — Z8711 Personal history of peptic ulcer disease: Secondary | ICD-10-CM

## 2017-01-21 DIAGNOSIS — E785 Hyperlipidemia, unspecified: Secondary | ICD-10-CM | POA: Diagnosis present

## 2017-01-21 DIAGNOSIS — Z905 Acquired absence of kidney: Secondary | ICD-10-CM

## 2017-01-21 DIAGNOSIS — S42033A Displaced fracture of lateral end of unspecified clavicle, initial encounter for closed fracture: Secondary | ICD-10-CM | POA: Diagnosis present

## 2017-01-21 LAB — COMPREHENSIVE METABOLIC PANEL
ALBUMIN: 3.3 g/dL — AB (ref 3.5–5.0)
ALT: 17 U/L (ref 17–63)
ANION GAP: 11 (ref 5–15)
AST: 28 U/L (ref 15–41)
Alkaline Phosphatase: 108 U/L (ref 38–126)
BUN: 26 mg/dL — AB (ref 6–20)
CHLORIDE: 101 mmol/L (ref 101–111)
CO2: 25 mmol/L (ref 22–32)
Calcium: 8.8 mg/dL — ABNORMAL LOW (ref 8.9–10.3)
Creatinine, Ser: 10.3 mg/dL — ABNORMAL HIGH (ref 0.61–1.24)
GFR calc Af Amer: 6 mL/min — ABNORMAL LOW (ref >60)
GFR, EST NON AFRICAN AMERICAN: 5 mL/min — AB (ref >60)
Glucose, Bld: 142 mg/dL — ABNORMAL HIGH (ref 65–99)
POTASSIUM: 4.9 mmol/L (ref 3.5–5.1)
Sodium: 137 mmol/L (ref 135–145)
Total Bilirubin: 0.6 mg/dL (ref 0.3–1.2)
Total Protein: 8.1 g/dL (ref 6.5–8.1)

## 2017-01-21 LAB — CBC WITH DIFFERENTIAL/PLATELET
BASOS ABS: 0 10*3/uL (ref 0.0–0.1)
BASOS PCT: 0 %
EOS PCT: 0 %
Eosinophils Absolute: 0 10*3/uL (ref 0.0–0.7)
HCT: 42.4 % (ref 39.0–52.0)
Hemoglobin: 14.2 g/dL (ref 13.0–17.0)
Lymphocytes Relative: 14 %
Lymphs Abs: 1.5 10*3/uL (ref 0.7–4.0)
MCH: 33.6 pg (ref 26.0–34.0)
MCHC: 33.5 g/dL (ref 30.0–36.0)
MCV: 100.5 fL — AB (ref 78.0–100.0)
MONO ABS: 0.9 10*3/uL (ref 0.1–1.0)
Monocytes Relative: 9 %
Neutro Abs: 8.3 10*3/uL — ABNORMAL HIGH (ref 1.7–7.7)
Neutrophils Relative %: 77 %
PLATELETS: 168 10*3/uL (ref 150–400)
RBC: 4.22 MIL/uL (ref 4.22–5.81)
RDW: 15.4 % (ref 11.5–15.5)
WBC: 10.7 10*3/uL — ABNORMAL HIGH (ref 4.0–10.5)

## 2017-01-21 NOTE — ED Triage Notes (Addendum)
Patient missed his step and fell forward at friend's home this evening with no LOC/ambulatory , reports pain at right shoulder with swelling , laceration approx. 1/2" at right eyebrow with minimal bleeding and right lower leg pain . Alert and oriented.

## 2017-01-22 ENCOUNTER — Emergency Department (HOSPITAL_COMMUNITY): Payer: BC Managed Care – PPO

## 2017-01-22 ENCOUNTER — Inpatient Hospital Stay (HOSPITAL_COMMUNITY)
Admission: EM | Admit: 2017-01-22 | Discharge: 2017-01-27 | DRG: 492 | Disposition: A | Discharge status: Rehabilitation Facility | Payer: BC Managed Care – PPO | Attending: Family Medicine | Admitting: Family Medicine

## 2017-01-22 ENCOUNTER — Encounter (HOSPITAL_COMMUNITY): Payer: Self-pay | Admitting: General Practice

## 2017-01-22 ENCOUNTER — Inpatient Hospital Stay (HOSPITAL_COMMUNITY): Payer: BC Managed Care – PPO

## 2017-01-22 DIAGNOSIS — N2581 Secondary hyperparathyroidism of renal origin: Secondary | ICD-10-CM | POA: Diagnosis present

## 2017-01-22 DIAGNOSIS — S82201A Unspecified fracture of shaft of right tibia, initial encounter for closed fracture: Secondary | ICD-10-CM | POA: Diagnosis present

## 2017-01-22 DIAGNOSIS — S42143A Displaced fracture of glenoid cavity of scapula, unspecified shoulder, initial encounter for closed fracture: Secondary | ICD-10-CM | POA: Diagnosis present

## 2017-01-22 DIAGNOSIS — S01119A Laceration without foreign body of unspecified eyelid and periocular area, initial encounter: Secondary | ICD-10-CM

## 2017-01-22 DIAGNOSIS — S0181XA Laceration without foreign body of other part of head, initial encounter: Secondary | ICD-10-CM | POA: Diagnosis not present

## 2017-01-22 DIAGNOSIS — Z86718 Personal history of other venous thrombosis and embolism: Secondary | ICD-10-CM | POA: Diagnosis not present

## 2017-01-22 DIAGNOSIS — Z992 Dependence on renal dialysis: Secondary | ICD-10-CM | POA: Diagnosis not present

## 2017-01-22 DIAGNOSIS — Z419 Encounter for procedure for purposes other than remedying health state, unspecified: Secondary | ICD-10-CM

## 2017-01-22 DIAGNOSIS — M7989 Other specified soft tissue disorders: Secondary | ICD-10-CM | POA: Diagnosis not present

## 2017-01-22 DIAGNOSIS — S42031S Displaced fracture of lateral end of right clavicle, sequela: Secondary | ICD-10-CM | POA: Diagnosis not present

## 2017-01-22 DIAGNOSIS — Z79899 Other long term (current) drug therapy: Secondary | ICD-10-CM | POA: Diagnosis not present

## 2017-01-22 DIAGNOSIS — S82141A Displaced bicondylar fracture of right tibia, initial encounter for closed fracture: Secondary | ICD-10-CM

## 2017-01-22 DIAGNOSIS — S01111A Laceration without foreign body of right eyelid and periocular area, initial encounter: Secondary | ICD-10-CM

## 2017-01-22 DIAGNOSIS — W19XXXA Unspecified fall, initial encounter: Secondary | ICD-10-CM

## 2017-01-22 DIAGNOSIS — K219 Gastro-esophageal reflux disease without esophagitis: Secondary | ICD-10-CM | POA: Diagnosis present

## 2017-01-22 DIAGNOSIS — R609 Edema, unspecified: Secondary | ICD-10-CM

## 2017-01-22 DIAGNOSIS — R52 Pain, unspecified: Secondary | ICD-10-CM | POA: Diagnosis not present

## 2017-01-22 DIAGNOSIS — Z800 Family history of malignant neoplasm of digestive organs: Secondary | ICD-10-CM | POA: Diagnosis not present

## 2017-01-22 DIAGNOSIS — M3214 Glomerular disease in systemic lupus erythematosus: Secondary | ICD-10-CM | POA: Diagnosis present

## 2017-01-22 DIAGNOSIS — W109XXA Fall (on) (from) unspecified stairs and steps, initial encounter: Secondary | ICD-10-CM | POA: Diagnosis present

## 2017-01-22 DIAGNOSIS — T148XXA Other injury of unspecified body region, initial encounter: Secondary | ICD-10-CM

## 2017-01-22 DIAGNOSIS — I120 Hypertensive chronic kidney disease with stage 5 chronic kidney disease or end stage renal disease: Secondary | ICD-10-CM | POA: Diagnosis present

## 2017-01-22 DIAGNOSIS — S42101A Fracture of unspecified part of scapula, right shoulder, initial encounter for closed fracture: Secondary | ICD-10-CM | POA: Diagnosis present

## 2017-01-22 DIAGNOSIS — S82401A Unspecified fracture of shaft of right fibula, initial encounter for closed fracture: Secondary | ICD-10-CM | POA: Diagnosis present

## 2017-01-22 DIAGNOSIS — S82141S Displaced bicondylar fracture of right tibia, sequela: Secondary | ICD-10-CM | POA: Diagnosis not present

## 2017-01-22 DIAGNOSIS — T07XXXA Unspecified multiple injuries, initial encounter: Secondary | ICD-10-CM | POA: Diagnosis present

## 2017-01-22 DIAGNOSIS — S42001S Fracture of unspecified part of right clavicle, sequela: Secondary | ICD-10-CM | POA: Diagnosis not present

## 2017-01-22 DIAGNOSIS — S42033A Displaced fracture of lateral end of unspecified clavicle, initial encounter for closed fracture: Secondary | ICD-10-CM | POA: Diagnosis present

## 2017-01-22 DIAGNOSIS — S83281A Other tear of lateral meniscus, current injury, right knee, initial encounter: Secondary | ICD-10-CM | POA: Diagnosis present

## 2017-01-22 DIAGNOSIS — E785 Hyperlipidemia, unspecified: Secondary | ICD-10-CM | POA: Diagnosis present

## 2017-01-22 DIAGNOSIS — S42031A Displaced fracture of lateral end of right clavicle, initial encounter for closed fracture: Secondary | ICD-10-CM | POA: Diagnosis present

## 2017-01-22 DIAGNOSIS — Z7952 Long term (current) use of systemic steroids: Secondary | ICD-10-CM | POA: Diagnosis not present

## 2017-01-22 DIAGNOSIS — Z01818 Encounter for other preprocedural examination: Secondary | ICD-10-CM

## 2017-01-22 DIAGNOSIS — Z880 Allergy status to penicillin: Secondary | ICD-10-CM | POA: Diagnosis not present

## 2017-01-22 DIAGNOSIS — N186 End stage renal disease: Secondary | ICD-10-CM | POA: Diagnosis present

## 2017-01-22 DIAGNOSIS — Z9889 Other specified postprocedural states: Secondary | ICD-10-CM | POA: Diagnosis not present

## 2017-01-22 DIAGNOSIS — Z905 Acquired absence of kidney: Secondary | ICD-10-CM | POA: Diagnosis not present

## 2017-01-22 DIAGNOSIS — S82141D Displaced bicondylar fracture of right tibia, subsequent encounter for closed fracture with routine healing: Secondary | ICD-10-CM | POA: Diagnosis not present

## 2017-01-22 DIAGNOSIS — B20 Human immunodeficiency virus [HIV] disease: Secondary | ICD-10-CM | POA: Diagnosis present

## 2017-01-22 DIAGNOSIS — Z8249 Family history of ischemic heart disease and other diseases of the circulatory system: Secondary | ICD-10-CM | POA: Diagnosis not present

## 2017-01-22 HISTORY — DX: Personal history of other diseases of the musculoskeletal system and connective tissue: Z87.39

## 2017-01-22 HISTORY — DX: Unspecified fall, initial encounter: W19.XXXA

## 2017-01-22 HISTORY — DX: Dependence on renal dialysis: Z99.2

## 2017-01-22 HISTORY — DX: Gastro-esophageal reflux disease without esophagitis: K21.9

## 2017-01-22 HISTORY — DX: End stage renal disease: N18.6

## 2017-01-22 LAB — RENAL FUNCTION PANEL
ALBUMIN: 3 g/dL — AB (ref 3.5–5.0)
Anion gap: 11 (ref 5–15)
BUN: 32 mg/dL — ABNORMAL HIGH (ref 6–20)
CO2: 27 mmol/L (ref 22–32)
CREATININE: 11.47 mg/dL — AB (ref 0.61–1.24)
Calcium: 8.4 mg/dL — ABNORMAL LOW (ref 8.9–10.3)
Chloride: 101 mmol/L (ref 101–111)
GFR calc non Af Amer: 4 mL/min — ABNORMAL LOW (ref >60)
GFR, EST AFRICAN AMERICAN: 5 mL/min — AB (ref >60)
Glucose, Bld: 106 mg/dL — ABNORMAL HIGH (ref 65–99)
Phosphorus: 2.4 mg/dL — ABNORMAL LOW (ref 2.5–4.6)
Potassium: 4.9 mmol/L (ref 3.5–5.1)
SODIUM: 139 mmol/L (ref 135–145)

## 2017-01-22 LAB — CBC
HCT: 44.7 % (ref 39.0–52.0)
HEMATOCRIT: 40.9 % (ref 39.0–52.0)
HEMOGLOBIN: 13.4 g/dL (ref 13.0–17.0)
Hemoglobin: 14.5 g/dL (ref 13.0–17.0)
MCH: 32.5 pg (ref 26.0–34.0)
MCH: 33 pg (ref 26.0–34.0)
MCHC: 32.4 g/dL (ref 30.0–36.0)
MCHC: 32.8 g/dL (ref 30.0–36.0)
MCV: 100.2 fL — ABNORMAL HIGH (ref 78.0–100.0)
MCV: 100.7 fL — ABNORMAL HIGH (ref 78.0–100.0)
PLATELETS: 123 10*3/uL — AB (ref 150–400)
Platelets: 133 10*3/uL — ABNORMAL LOW (ref 150–400)
RBC: 4.46 MIL/uL (ref 4.22–5.81)
RBC: 4.06 MIL/uL — ABNORMAL LOW (ref 4.22–5.81)
RDW: 15.5 % (ref 11.5–15.5)
RDW: 15.1 % (ref 11.5–15.5)
WBC: 9.9 10*3/uL (ref 4.0–10.5)
WBC: 11.2 10*3/uL — ABNORMAL HIGH (ref 4.0–10.5)

## 2017-01-22 MED ORDER — DOLUTEGRAVIR SODIUM 50 MG PO TABS
50.0000 mg | ORAL_TABLET | Freq: Every day | ORAL | Status: DC
  Administered 2017-01-22: 50 mg via ORAL
  Filled 2017-01-22 (×3): qty 1

## 2017-01-22 MED ORDER — PENTAFLUOROPROP-TETRAFLUOROETH EX AERO: 1.0000 "application " | INHALATION_SPRAY | CUTANEOUS | Status: DC | PRN

## 2017-01-22 MED ORDER — CALCIUM ACETATE (PHOS BINDER) 667 MG PO CAPS
1334.0000 mg | ORAL_CAPSULE | Freq: Three times a day (TID) | ORAL | Status: DC
  Administered 2017-01-23 – 2017-01-27 (×9): 1334 mg via ORAL
  Filled 2017-01-22 (×9): qty 2

## 2017-01-22 MED ORDER — POVIDONE-IODINE 10 % EX SWAB: 2.0000 "application " | Freq: Once | CUTANEOUS | Status: DC

## 2017-01-22 MED ORDER — CHLORHEXIDINE GLUCONATE 4 % EX LIQD
60.0000 mL | Freq: Once | CUTANEOUS | Status: AC
  Administered 2017-01-23: 4 via TOPICAL
  Filled 2017-01-22: qty 15

## 2017-01-22 MED ORDER — MORPHINE SULFATE (PF) 4 MG/ML IV SOLN
4.0000 mg | INTRAVENOUS | Status: DC | PRN
  Administered 2017-01-22 (×2): 4 mg via INTRAVENOUS
  Filled 2017-01-22: qty 1

## 2017-01-22 MED ORDER — SODIUM CHLORIDE 0.9 % IV SOLN
100.0000 mL | INTRAVENOUS | Status: DC | PRN
Start: 2017-01-22 — End: 2017-01-23
  Administered 2017-01-23: 10:00:00 via INTRAVENOUS

## 2017-01-22 MED ORDER — LIDOCAINE HCL (PF) 1 % IJ SOLN: 5.0000 mL | INTRAMUSCULAR | Status: DC | PRN

## 2017-01-22 MED ORDER — HEPARIN SODIUM (PORCINE) 1000 UNIT/ML DIALYSIS
1000.0000 [IU] | INTRAMUSCULAR | Status: DC | PRN
  Filled 2017-01-22: qty 1

## 2017-01-22 MED ORDER — CLINDAMYCIN PHOSPHATE 900 MG/50ML IV SOLN: 900.0000 mg | INTRAVENOUS | Status: DC

## 2017-01-22 MED ORDER — PREDNISONE 10 MG PO TABS
10.0000 mg | ORAL_TABLET | Freq: Every day | ORAL | Status: DC
  Administered 2017-01-22: 10 mg via ORAL
  Filled 2017-01-22: qty 1

## 2017-01-22 MED ORDER — ABACAVIR SULFATE 300 MG PO TABS
600.0000 mg | ORAL_TABLET | Freq: Every day | ORAL | Status: DC
  Administered 2017-01-22: 600 mg via ORAL
  Filled 2017-01-22 (×3): qty 2

## 2017-01-22 MED ORDER — MORPHINE SULFATE (PF) 4 MG/ML IV SOLN
INTRAVENOUS | Status: AC
  Filled 2017-01-22: qty 1

## 2017-01-22 MED ORDER — LAMIVUDINE 10 MG/ML PO SOLN
50.0000 mg | Freq: Every day | ORAL | Status: DC
  Administered 2017-01-22: 50 mg via ORAL
  Filled 2017-01-22 (×2): qty 5

## 2017-01-22 MED ORDER — LIDOCAINE-PRILOCAINE 2.5-2.5 % EX CREA
1.0000 "application " | TOPICAL_CREAM | CUTANEOUS | Status: DC | PRN
  Filled 2017-01-22: qty 5

## 2017-01-22 MED ORDER — HYDROMORPHONE HCL 1 MG/ML IJ SOLN
0.5000 mg | INTRAMUSCULAR | Status: DC | PRN
  Administered 2017-01-22 – 2017-01-23 (×2): 0.5 mg via INTRAVENOUS
  Filled 2017-01-22 (×2): qty 1

## 2017-01-22 MED ORDER — HYDROXYCHLOROQUINE SULFATE 200 MG PO TABS
200.0000 mg | ORAL_TABLET | Freq: Two times a day (BID) | ORAL | Status: DC
  Administered 2017-01-22 – 2017-01-27 (×7): 200 mg via ORAL
  Filled 2017-01-22 (×7): qty 1

## 2017-01-22 MED ORDER — ALTEPLASE 2 MG IJ SOLR: 2.0000 mg | Freq: Once | INTRAMUSCULAR | Status: DC | PRN

## 2017-01-22 MED ORDER — SODIUM CHLORIDE 0.9 % IV SOLN: 100.0000 mL | INTRAVENOUS | Status: DC | PRN

## 2017-01-22 MED ORDER — HEPARIN SODIUM (PORCINE) 1000 UNIT/ML DIALYSIS
20.0000 [IU]/kg | INTRAMUSCULAR | Status: DC | PRN
  Filled 2017-01-22: qty 2

## 2017-01-22 MED ORDER — MORPHINE SULFATE (PF) 4 MG/ML IV SOLN
4.0000 mg | Freq: Once | INTRAVENOUS | Status: AC
  Administered 2017-01-22: 4 mg via INTRAVENOUS
  Filled 2017-01-22: qty 1

## 2017-01-22 MED ORDER — OXYCODONE-ACETAMINOPHEN 5-325 MG PO TABS
2.0000 | ORAL_TABLET | Freq: Once | ORAL | Status: AC
  Administered 2017-01-22: 2 via ORAL
  Filled 2017-01-22: qty 2

## 2017-01-22 MED ORDER — AMITRIPTYLINE HCL 10 MG PO TABS
10.0000 mg | ORAL_TABLET | Freq: Every day | ORAL | Status: DC
  Administered 2017-01-22 – 2017-01-26 (×5): 10 mg via ORAL
  Filled 2017-01-22 (×6): qty 1

## 2017-01-22 MED ORDER — LIDOCAINE-EPINEPHRINE 2 %-1:100000 IJ SOLN
20.0000 mL | Freq: Once | INTRAMUSCULAR | Status: AC
  Administered 2017-01-22: 20 mL
  Filled 2017-01-22: qty 20

## 2017-01-22 NOTE — Progress Notes (Signed)
Patient came from HD few minutes before 7pm c/o severe pain. Pain meds was given.

## 2017-01-22 NOTE — Progress Notes (Signed)
Family Medicine Teaching Service Daily Progress Note Intern Pager: [phone]  Patient name: Kenneth [name] Medical record number: [id number] Date of birth: 04/06/1955 Age: 61 y.o. Gender: male  Primary Care Provider: Nicolette Bang, DO Consultants: orthopedics, nephrology  Code Status: full   Pt Overview and Major Events to Date:  Admitted to [facility] on 10/22  Assessment and Plan: [facility] [name] is a 61 y.o. male presenting with mechanical fall resulting in multiple fractures. PMH is significant for HIV, ESRD (dialysis MWF), lupus nephritis  R lateral tibial plateau fracture, R displaced distal clavicle fracture, right superior scapular fracture 2/2 mechanical fall Mechanical fall resulting in multiple fractures including nondisplaced R clavicle, R lateral tibial plateau. Orthopedics consulted and recommend surgery on 10/23, patient states he is scheduled for 10am. Patient currently NPO. Pain well controlled. Ortho tech placed sling and knee immobilizer in ED.  - NPO for surgery - orthopedics consulted, appreciate recommendations  - dilaudid 0.5mg  q3hrs PRN  - PT/OT pending ortho recs   ESRD Receives dialysis MWF with [facility]. No longer making urine.  -Nephrology consulted, planned for non urgent HD on 10/22 and then continued MWF HD  Fever Patient spiked a fever of 100.6 overnight, at which time patient was asymptomatic. Patient this morning has another fever of 100.5. Likely 2/2 stress of fracture. Blood cx ordered overnight, still pending. Leukocytosis this morning with WBC of 13.9. CXR showing no signs of pneumonia.  -continue to trend fever curve -consider starting abx if continues to spike fever given PMHx of HIV  -monitor blood cx   HIV Stable on ziagen, tivicay epivir. Followed by [name].  - continue the above regimen  Hyperlipidemia Assessed by Dr. Juleen China on 07/05/16 and noted to have ASCVD risk >10%.Offered mod intensity statin vs: lifestyle  modification. Plan to recheck in one year.   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 Stable - will continue prednisone 10mg  daily - continue plaquinel 200mg  BID  FEN/GI: NPO, intermittent boluses Prophylaxis: heparin   Disposition: continued inpatient care for surgical fixation of fractures   Subjective:  Patient today with no complaints. States his pain is improved but notes pain when he moves. States he will get next HD on Wednesday. Knows he had a fever last night but states he was asymptomatic. States no fever, chills, nausea, vomiting, or diarrhea. Denies cough. States he no longer makes urine.   Objective: Temp:  [98.6 F (37 C)-100.6 F (38.1 C)] 100.5 F (38.1 C) (10/23 0659) Pulse Rate:  [97-114] 97 (10/23 0659) Resp:  [15-18] 16 (10/23 0659) BP: (113-143)/(78-103) 143/83 (10/23 0659) SpO2:  [98 %-100 %] 100 % (10/23 0659) Weight:  [145 lb (65.8 kg)] 145 lb (65.8 kg) (10/23 1003) Physical Exam: General: awake and alert, NAD, laying in bed  Cardiovascular: RRR, no MRG Respiratory: CTAB, no wheezes, rales, or rhonchi  Abdomen: soft, non tender, non distended, bowel sounds normal  Extremities: R arm in immobilizer sling, R leg and knee in immobilizer, decreased ROM in right extremities, normal ROM and strength in left extremities, normal grip strength, 2+ pulses bilaterally, <2sec cap refill   Laboratory:  Recent Labs Lab 01/22/17 1225 01/22/17 1949 01/23/17 0655 01/23/17 1006  WBC 9.9 11.2* 13.9*  --   HGB 14.5 13.4 13.0 13.3  HCT 44.7 40.9 39.3 39.0  PLT 123* 133* 164  --     Recent Labs Lab 01/21/17 2248 01/22/17 1225 01/23/17 1006  NA 137 139 138  K 4.9 4.9 4.9  CL 101 101  --  CO2 25 27  --   BUN 26* 32*  --   CREATININE 10.30* 11.47*  --   CALCIUM 8.8* 8.4*  --   PROT 8.1  --   --   BILITOT 0.6  --   --   ALKPHOS 108  --   --   ALT 17  --   --   AST 28  --   --   GLUCOSE 142* 106* 80     Imaging/Diagnostic Tests: Dg Clavicle  Right  Result Date: 01/22/2017 CLINICAL DATA:  Right shoulder pain after fall. EXAM: RIGHT CLAVICLE - 2+ VIEWS COMPARISON:  Same day radiographs of the shoulder. FINDINGS: An acute, closed, comminuted intra-articular fracture of the distal clavicle is noted. Slight caudal angulation of the distal fracture fragments. Fracture lucencies are also noted of the included base of the coracoid, scapula above the level of the scapular spine and possibly the glenoid. No shoulder dislocations. The included humeral head and neck are intact. IMPRESSION: 1. Acute, comminuted, intra-articular fracture of the distal clavicle extending into the acromioclavicular joint. Slight caudal angulation of the distal fracture fragments. 2. Additional irregular fracture lucencies at the base of the coracoid and supraspinous portion of the scapula are noted on these images. 3. A fracture of the glenoid fossa is also seen. Electronically Signed   By: Ashley Royalty M.D.   On: 01/22/2017 03:43   Dg Shoulder Right  Result Date: 01/21/2017 CLINICAL DATA:  61 y/o  M; fall with pain and swelling. EXAM: RIGHT SHOULDER - 2+ VIEW COMPARISON:  None. FINDINGS: Irregularity of distal clavicle at the acromioclavicular joint with transverse lucency may representing acute fracture. No shoulder dislocation. No proximal humerus fracture. IMPRESSION: Irregularity of distal clavicle at acromioclavicular joint with lucencies may represent acute fracture. Correlate for focal tenderness and consider clavicle radiographs. Electronically Signed   By: Kristine Garbe M.D.   On: 01/21/2017 23:44   Dg Tibia/fibula Right  Result Date: 01/21/2017 CLINICAL DATA:  Status post fall, with right lower leg pain. Initial encounter. EXAM: RIGHT TIBIA AND FIBULA - 2 VIEW COMPARISON:  None. FINDINGS: There is a comminuted fracture involving the lateral tibial plateau, with perhaps 8 mm of depression. A small knee joint effusion is noted. No additional fractures  are seen. The ankle mortise is grossly unremarkable in appearance. The soft tissues are otherwise grossly unremarkable in appearance. IMPRESSION: Comminuted fracture involving the lateral tibial plateau, with perhaps 8 mm of depression. Small knee joint effusion noted. Electronically Signed   By: Garald Balding M.D.   On: 01/21/2017 23:50   Ct Head Wo Contrast  Result Date: 01/22/2017 CLINICAL DATA:  Status post fall forward, with right shoulder pain, and concern for head or cervical spine injury. Initial encounter. EXAM: CT HEAD WITHOUT CONTRAST CT CERVICAL SPINE WITHOUT CONTRAST TECHNIQUE: Multidetector CT imaging of the head and cervical spine was performed following the standard protocol without intravenous contrast. Multiplanar CT image reconstructions of the cervical spine were also generated. COMPARISON:  None. FINDINGS: CT HEAD FINDINGS Brain: No evidence of acute infarction, hemorrhage, hydrocephalus, extra-axial collection or mass lesion/mass effect. The posterior fossa, including the cerebellum, brainstem and fourth ventricle, is within normal limits. The third and lateral ventricles, and basal ganglia are unremarkable in appearance. The cerebral hemispheres are symmetric in appearance, with normal gray-white differentiation. No mass effect or midline shift is seen. Vascular: No hyperdense vessel or unexpected calcification. Skull: There is no evidence of fracture; visualized osseous structures are unremarkable in appearance. Sinuses/Orbits: The  orbits are within normal limits. The paranasal sinuses and mastoid air cells are well-aerated. Other: Cerumen is noted filling the right external auditory canal. CT CERVICAL SPINE FINDINGS Alignment: Normal. Skull base and vertebrae: No acute fracture. No primary bone lesion or focal pathologic process. Soft tissues and spinal canal: No prevertebral fluid or swelling. No visible canal hematoma. Disc levels: Minimal disc space narrowing is noted along the  lower cervical spine, with small anterior and posterior disc osteophyte complexes. Mild underlying facet disease is noted along the cervical spine. Upper chest: The visualized lung apices are clear. The thyroid gland is unremarkable. Other: No additional soft tissue abnormalities are seen. IMPRESSION: 1. No evidence of traumatic intracranial injury or fracture. 2. No evidence of fracture or subluxation along the cervical spine. 3. Cerumen noted filling the right external auditory canal. Electronically Signed   By: Garald Balding M.D.   On: 01/22/2017 06:39   Ct Cervical Spine Wo Contrast  Result Date: 01/22/2017 CLINICAL DATA:  Status post fall forward, with right shoulder pain, and concern for head or cervical spine injury. Initial encounter. EXAM: CT HEAD WITHOUT CONTRAST CT CERVICAL SPINE WITHOUT CONTRAST TECHNIQUE: Multidetector CT imaging of the head and cervical spine was performed following the standard protocol without intravenous contrast. Multiplanar CT image reconstructions of the cervical spine were also generated. COMPARISON:  None. FINDINGS: CT HEAD FINDINGS Brain: No evidence of acute infarction, hemorrhage, hydrocephalus, extra-axial collection or mass lesion/mass effect. The posterior fossa, including the cerebellum, brainstem and fourth ventricle, is within normal limits. The third and lateral ventricles, and basal ganglia are unremarkable in appearance. The cerebral hemispheres are symmetric in appearance, with normal gray-white differentiation. No mass effect or midline shift is seen. Vascular: No hyperdense vessel or unexpected calcification. Skull: There is no evidence of fracture; visualized osseous structures are unremarkable in appearance. Sinuses/Orbits: The orbits are within normal limits. The paranasal sinuses and mastoid air cells are well-aerated. Other: Cerumen is noted filling the right external auditory canal. CT CERVICAL SPINE FINDINGS Alignment: Normal. Skull base and  vertebrae: No acute fracture. No primary bone lesion or focal pathologic process. Soft tissues and spinal canal: No prevertebral fluid or swelling. No visible canal hematoma. Disc levels: Minimal disc space narrowing is noted along the lower cervical spine, with small anterior and posterior disc osteophyte complexes. Mild underlying facet disease is noted along the cervical spine. Upper chest: The visualized lung apices are clear. The thyroid gland is unremarkable. Other: No additional soft tissue abnormalities are seen. IMPRESSION: 1. No evidence of traumatic intracranial injury or fracture. 2. No evidence of fracture or subluxation along the cervical spine. 3. Cerumen noted filling the right external auditory canal. Electronically Signed   By: Garald Balding M.D.   On: 01/22/2017 06:39   Ct Knee Right Wo Contrast  Result Date: 01/22/2017 CLINICAL DATA:  Status post fall while walking up steps, with right knee pain and swelling. Initial encounter. EXAM: CT OF THE RIGHT KNEE WITHOUT CONTRAST TECHNIQUE: Multidetector CT imaging of the right knee was performed according to the standard protocol. Multiplanar CT image reconstructions were also generated. COMPARISON:  Right knee radiographs performed 01/21/2017 FINDINGS: Bones/Joint/Cartilage There is a comminuted fracture involving the lateral tibial plateau, extending into the metadiaphysis, with up to 9 mm of depression. Multiple displaced fracture fragments are noted. There is mild fragmentation involving the lateral aspect of the tibial spine. There is also a small fracture at the lateral aspect of the fibular head. A large lipohemarthrosis is noted. Ligaments  Suboptimally assessed by CT. Diffuse edema is noted about the lateral collateral ligament complex. Muscles and Tendons The visualized musculature is grossly unremarkable. Diffuse thickening of the distal quadriceps tendon may reflect some degree of injury. Soft tissues The vasculature is not well assessed  without contrast. Soft tissue injury is noted about the knee. IMPRESSION: 1. Comminuted fracture involving the lateral tibial plateau, extending into the metadiaphysis, with up to 9 mm of depression. Mild fragmentation involves the lateral aspect of the tibial spine. 2. Small fracture at the lateral aspect of the fibular head. 3. Large lipohemarthrosis noted. 4. Diffuse thickening of the distal quadriceps tendon may reflect some degree of injury. 5. Diffuse edema about the lateral collateral ligament complex. Ligaments not well assessed on CT. 6. Soft tissue injury about the knee. Electronically Signed   By: Garald Balding M.D.   On: 01/22/2017 04:44   Ct Shoulder Right Wo Contrast  Result Date: 01/22/2017 CLINICAL DATA:  Status post fall forward, with right shoulder pain and swelling. Initial encounter. EXAM: CT OF THE UPPER RIGHT EXTREMITY WITHOUT CONTRAST TECHNIQUE: Multidetector CT imaging of the upper right extremity was performed according to the standard protocol. COMPARISON:  Right shoulder radiographs performed 01/21/2017 FINDINGS: Bones/Joint/Cartilage There is a mildly displaced fracture involving the distal aspect of the right clavicle, extending into the right acromioclavicular joint. There is also a mildly comminuted fracture involving the superior aspect of the right scapula, extending along the edge of the glenoid. No definite glenohumeral joint effusion is seen. The cartilage is not well assessed on CT. Ligaments Suboptimally assessed by CT. Muscles and Tendons Mild diffuse soft tissue injury is noted along the right axilla. No definite focal abnormalities are seen with regard to the musculature. Soft tissues No new focal abnormalities are identified. IMPRESSION: 1. Mildly displaced fracture involving the distal aspect of the right clavicle, extending into the right acromioclavicular joint. 2. Mildly comminuted fracture involving the superior aspect of the right scapula, extending along the  edge of the glenoid. 3. Mild diffuse soft tissue injury along the right axilla. Electronically Signed   By: Garald Balding M.D.   On: 01/22/2017 06:47   Dg Chest Port 1 View  Result Date: 01/22/2017 CLINICAL DATA:  61 year old male under preoperative evaluation prior to ORIF procedure tomorrow. EXAM: PORTABLE CHEST 1 VIEW COMPARISON:  Chest x-ray 02/23/2015. FINDINGS: Lung volumes are normal. No consolidative airspace disease. Minimal scarring or subsegmental atelectasis in the periphery of the left lung base. No pleural effusions. No pneumothorax. No pulmonary nodule or mass noted. Pulmonary vasculature and the cardiomediastinal silhouette are within normal limits. Atherosclerosis in the thoracic aorta. Surgical clips Barnabas Lister over the epigastric region. IMPRESSION: 1. No radiographic evidence of acute cardiopulmonary disease. 2. Aortic atherosclerosis. Electronically Signed   By: Vinnie Langton M.D.   On: 01/22/2017 19:00    Caroline More, DO 01/23/2017, 1:41 PM PGY-1, [facility] Intern pager: [phone], text pages welcome

## 2017-01-22 NOTE — Discharge Summary (Signed)
[facility] Discharge Summary  Patient name: MALIKE [name] Medical record number: [id number] Date of birth: 08/13/1955 Age: 61 y.o. Gender: male Date of Admission: 01/22/2017  Date of Discharge: 01/27/17 Admitting Physician: Leeanne Rio, MD  Primary Care Provider: Nicolette Bang, DO Consultants: nephrology, orthopedic surgery   Indication for Hospitalization: multiple fractures 2/2 mechanical fall   Discharge Diagnoses/Problem List:  R lateral tibial plateau fracture, R displaced distal clavicle fracture, right superior scapular fracture 2/2 mechanical fall Tachycardia Vit D deficiency  ESRD Fever HIV Hyperlipidemia Lupus nephritis Constipation   Disposition: CIR  Discharge Condition: Stable, improving   Discharge Exam:  General: awake and alert, laying in bed, currently on HD Cardiovascular: RRR, no MRG Respiratory: CTAB, no wheezes, rales or rhonchi  Abdomen: soft, non tender, non distended  Extremities: no edema, unable to move right shoulder, left leg wrapped and in immobilizer. 5/5 muscle strength in left extremities.   Brief Hospital Course:  Kenneth [name] is a 61 y.o. male presenting with multiple fractures 2/2 mechanical fall. Patient denies LOC. In ED xrays showed acute, comminuted, intra-articular fx of the distal clavicle extending to acromioclavicular joint, fracture of glenoid fossa, Right comminuted fx of lateral tibial plateau extending into metadiaphysis, and small fx of lateral fibular head. CT showed no intracranial injury or fracture and no fracture or subluxation along cervical spine. Orthopedics was consulted and planned for fixation on 10/23 with Dr. Marcelino Scot and NWB for 8 weeks.   Patient has history of ESRD with MWF dialysis with [facility]. Patient was seen by nephrology while inpatient and planned for MWF HD.   During admission vitamin D was found to be low. Patient was repleted with vitamin D  1000 units.   Issues for Follow Up:  1. Follow up with orthopedics. F/u 8-14 days 2. Continued PT in CIR 3. Given multiple fx from a small height, consider DEXA scan 4. Replete vitamin D as needed   Significant Procedures:  ORIF R tib/fib, meniscus repair, anterior fasciotomy  Significant Labs and Imaging:   Recent Labs Lab 01/25/17 0546 01/26/17 0500 01/27/17 0550  WBC 11.2* 13.0* 12.6*  HGB 11.9* 11.2* 11.5*  HCT 36.5* 35.2* 35.6*  PLT 181 218 218    Recent Labs Lab 01/21/17 2248 01/22/17 1225 01/23/17 1006 01/24/17 0359 01/25/17 0546 01/26/17 0500 01/27/17 0550  NA 137 139 138 135 135 138 134*  K 4.9 4.9 4.9 5.9* 5.0 4.8 4.6  CL 101 101  --  94* 94* 95* 93*  CO2 25 27  --  24 26 28 29   GLUCOSE 142* 106* 80 105* 68 121* 101*  BUN 26* 32*  --  49* 23* 59* 31*  CREATININE 10.30* 11.47*  --  9.91* 6.82* 9.46* 7.08*  CALCIUM 8.8* 8.4*  --  7.9*  8.2* 8.2* 8.4* 8.8*  MG  --   --   --  2.1  --   --   --   PHOS  --  2.4*  --  4.3 4.3 5.0* 4.8*  ALKPHOS 108  --   --   --   --   --   --   AST 28  --   --   --   --   --   --   ALT 17  --   --   --   --   --   --   ALBUMIN 3.3* 3.0*  --  2.6* 2.6* 2.4* 2.5*  Ref. Range 01/24/2017 03:59  Vit D, 1,25-Dihydroxy Latest Ref Range: 19.9 - 79.3 pg/mL <5.0 (L)  Vitamin D, 25-Hydroxy Latest Ref Range: 30.0 - 100.0 ng/mL 12.6 (L)    Dg Clavicle Right  Result Date: 01/22/2017 CLINICAL DATA:  Right shoulder pain after fall. EXAM: RIGHT CLAVICLE - 2+ VIEWS COMPARISON:  Same day radiographs of the shoulder. FINDINGS: An acute, closed, comminuted intra-articular fracture of the distal clavicle is noted. Slight caudal angulation of the distal fracture fragments. Fracture lucencies are also noted of the included base of the coracoid, scapula above the level of the scapular spine and possibly the glenoid. No shoulder dislocations. The included humeral head and neck are intact. IMPRESSION: 1. Acute, comminuted, intra-articular  fracture of the distal clavicle extending into the acromioclavicular joint. Slight caudal angulation of the distal fracture fragments. 2. Additional irregular fracture lucencies at the base of the coracoid and supraspinous portion of the scapula are noted on these images. 3. A fracture of the glenoid fossa is also seen. Electronically Signed   By: Ashley Royalty M.D.   On: 01/22/2017 03:43   Dg Shoulder Right  Result Date: 01/21/2017 CLINICAL DATA:  61 y/o  M; fall with pain and swelling. EXAM: RIGHT SHOULDER - 2+ VIEW COMPARISON:  None. FINDINGS: Irregularity of distal clavicle at the acromioclavicular joint with transverse lucency may representing acute fracture. No shoulder dislocation. No proximal humerus fracture. IMPRESSION: Irregularity of distal clavicle at acromioclavicular joint with lucencies may represent acute fracture. Correlate for focal tenderness and consider clavicle radiographs. Electronically Signed   By: Kristine Garbe M.D.   On: 01/21/2017 23:44   Dg Tibia/fibula Right  Result Date: 01/23/2017 CLINICAL DATA:  Fracture fixation EXAM: DG C-ARM 61-120 MIN; RIGHT TIBIA AND FIBULA - 2 VIEW COMPARISON:  01/2020 and 01/22/2017 FINDINGS: Depressed fracture of the lateral tibial plateau has been reduced now with normal joint space. Lateral plate and screws in good position. IMPRESSION: ORIF lateral tibial plateau fracture with normal joint space on postoperative images. Electronically Signed   By: Franchot Gallo M.D.   On: 01/23/2017 14:00   Dg Tibia/fibula Right  Result Date: 01/21/2017 CLINICAL DATA:  Status post fall, with right lower leg pain. Initial encounter. EXAM: RIGHT TIBIA AND FIBULA - 2 VIEW COMPARISON:  None. FINDINGS: There is a comminuted fracture involving the lateral tibial plateau, with perhaps 8 mm of depression. A small knee joint effusion is noted. No additional fractures are seen. The ankle mortise is grossly unremarkable in appearance. The soft tissues are  otherwise grossly unremarkable in appearance. IMPRESSION: Comminuted fracture involving the lateral tibial plateau, with perhaps 8 mm of depression. Small knee joint effusion noted. Electronically Signed   By: Garald Balding M.D.   On: 01/21/2017 23:50   Ct Head Wo Contrast  Result Date: 01/22/2017 CLINICAL DATA:  Status post fall forward, with right shoulder pain, and concern for head or cervical spine injury. Initial encounter. EXAM: CT HEAD WITHOUT CONTRAST CT CERVICAL SPINE WITHOUT CONTRAST TECHNIQUE: Multidetector CT imaging of the head and cervical spine was performed following the standard protocol without intravenous contrast. Multiplanar CT image reconstructions of the cervical spine were also generated. COMPARISON:  None. FINDINGS: CT HEAD FINDINGS Brain: No evidence of acute infarction, hemorrhage, hydrocephalus, extra-axial collection or mass lesion/mass effect. The posterior fossa, including the cerebellum, brainstem and fourth ventricle, is within normal limits. The third and lateral ventricles, and basal ganglia are unremarkable in appearance. The cerebral hemispheres are symmetric in appearance, with normal gray-white differentiation.  No mass effect or midline shift is seen. Vascular: No hyperdense vessel or unexpected calcification. Skull: There is no evidence of fracture; visualized osseous structures are unremarkable in appearance. Sinuses/Orbits: The orbits are within normal limits. The paranasal sinuses and mastoid air cells are well-aerated. Other: Cerumen is noted filling the right external auditory canal. CT CERVICAL SPINE FINDINGS Alignment: Normal. Skull base and vertebrae: No acute fracture. No primary bone lesion or focal pathologic process. Soft tissues and spinal canal: No prevertebral fluid or swelling. No visible canal hematoma. Disc levels: Minimal disc space narrowing is noted along the lower cervical spine, with small anterior and posterior disc osteophyte complexes. Mild  underlying facet disease is noted along the cervical spine. Upper chest: The visualized lung apices are clear. The thyroid gland is unremarkable. Other: No additional soft tissue abnormalities are seen. IMPRESSION: 1. No evidence of traumatic intracranial injury or fracture. 2. No evidence of fracture or subluxation along the cervical spine. 3. Cerumen noted filling the right external auditory canal. Electronically Signed   By: Garald Balding M.D.   On: 01/22/2017 06:39   Ct Cervical Spine Wo Contrast  Result Date: 01/22/2017 CLINICAL DATA:  Status post fall forward, with right shoulder pain, and concern for head or cervical spine injury. Initial encounter. EXAM: CT HEAD WITHOUT CONTRAST CT CERVICAL SPINE WITHOUT CONTRAST TECHNIQUE: Multidetector CT imaging of the head and cervical spine was performed following the standard protocol without intravenous contrast. Multiplanar CT image reconstructions of the cervical spine were also generated. COMPARISON:  None. FINDINGS: CT HEAD FINDINGS Brain: No evidence of acute infarction, hemorrhage, hydrocephalus, extra-axial collection or mass lesion/mass effect. The posterior fossa, including the cerebellum, brainstem and fourth ventricle, is within normal limits. The third and lateral ventricles, and basal ganglia are unremarkable in appearance. The cerebral hemispheres are symmetric in appearance, with normal gray-white differentiation. No mass effect or midline shift is seen. Vascular: No hyperdense vessel or unexpected calcification. Skull: There is no evidence of fracture; visualized osseous structures are unremarkable in appearance. Sinuses/Orbits: The orbits are within normal limits. The paranasal sinuses and mastoid air cells are well-aerated. Other: Cerumen is noted filling the right external auditory canal. CT CERVICAL SPINE FINDINGS Alignment: Normal. Skull base and vertebrae: No acute fracture. No primary bone lesion or focal pathologic process. Soft tissues  and spinal canal: No prevertebral fluid or swelling. No visible canal hematoma. Disc levels: Minimal disc space narrowing is noted along the lower cervical spine, with small anterior and posterior disc osteophyte complexes. Mild underlying facet disease is noted along the cervical spine. Upper chest: The visualized lung apices are clear. The thyroid gland is unremarkable. Other: No additional soft tissue abnormalities are seen. IMPRESSION: 1. No evidence of traumatic intracranial injury or fracture. 2. No evidence of fracture or subluxation along the cervical spine. 3. Cerumen noted filling the right external auditory canal. Electronically Signed   By: Garald Balding M.D.   On: 01/22/2017 06:39   Ct Knee Right Wo Contrast  Result Date: 01/22/2017 CLINICAL DATA:  Status post fall while walking up steps, with right knee pain and swelling. Initial encounter. EXAM: CT OF THE RIGHT KNEE WITHOUT CONTRAST TECHNIQUE: Multidetector CT imaging of the right knee was performed according to the standard protocol. Multiplanar CT image reconstructions were also generated. COMPARISON:  Right knee radiographs performed 01/21/2017 FINDINGS: Bones/Joint/Cartilage There is a comminuted fracture involving the lateral tibial plateau, extending into the metadiaphysis, with up to 9 mm of depression. Multiple displaced fracture fragments are noted. There  is mild fragmentation involving the lateral aspect of the tibial spine. There is also a small fracture at the lateral aspect of the fibular head. A large lipohemarthrosis is noted. Ligaments Suboptimally assessed by CT. Diffuse edema is noted about the lateral collateral ligament complex. Muscles and Tendons The visualized musculature is grossly unremarkable. Diffuse thickening of the distal quadriceps tendon may reflect some degree of injury. Soft tissues The vasculature is not well assessed without contrast. Soft tissue injury is noted about the knee. IMPRESSION: 1. Comminuted  fracture involving the lateral tibial plateau, extending into the metadiaphysis, with up to 9 mm of depression. Mild fragmentation involves the lateral aspect of the tibial spine. 2. Small fracture at the lateral aspect of the fibular head. 3. Large lipohemarthrosis noted. 4. Diffuse thickening of the distal quadriceps tendon may reflect some degree of injury. 5. Diffuse edema about the lateral collateral ligament complex. Ligaments not well assessed on CT. 6. Soft tissue injury about the knee. Electronically Signed   By: Garald Balding M.D.   On: 01/22/2017 04:44   Ct Shoulder Right Wo Contrast  Result Date: 01/22/2017 CLINICAL DATA:  Status post fall forward, with right shoulder pain and swelling. Initial encounter. EXAM: CT OF THE UPPER RIGHT EXTREMITY WITHOUT CONTRAST TECHNIQUE: Multidetector CT imaging of the upper right extremity was performed according to the standard protocol. COMPARISON:  Right shoulder radiographs performed 01/21/2017 FINDINGS: Bones/Joint/Cartilage There is a mildly displaced fracture involving the distal aspect of the right clavicle, extending into the right acromioclavicular joint. There is also a mildly comminuted fracture involving the superior aspect of the right scapula, extending along the edge of the glenoid. No definite glenohumeral joint effusion is seen. The cartilage is not well assessed on CT. Ligaments Suboptimally assessed by CT. Muscles and Tendons Mild diffuse soft tissue injury is noted along the right axilla. No definite focal abnormalities are seen with regard to the musculature. Soft tissues No new focal abnormalities are identified. IMPRESSION: 1. Mildly displaced fracture involving the distal aspect of the right clavicle, extending into the right acromioclavicular joint. 2. Mildly comminuted fracture involving the superior aspect of the right scapula, extending along the edge of the glenoid. 3. Mild diffuse soft tissue injury along the right axilla.  Electronically Signed   By: Garald Balding M.D.   On: 01/22/2017 06:47   Dg Chest Port 1 View  Result Date: 01/22/2017 CLINICAL DATA:  61 year old male under preoperative evaluation prior to ORIF procedure tomorrow. EXAM: PORTABLE CHEST 1 VIEW COMPARISON:  Chest x-ray 02/23/2015. FINDINGS: Lung volumes are normal. No consolidative airspace disease. Minimal scarring or subsegmental atelectasis in the periphery of the left lung base. No pleural effusions. No pneumothorax. No pulmonary nodule or mass noted. Pulmonary vasculature and the cardiomediastinal silhouette are within normal limits. Atherosclerosis in the thoracic aorta. Surgical clips Barnabas Lister over the epigastric region. IMPRESSION: 1. No radiographic evidence of acute cardiopulmonary disease. 2. Aortic atherosclerosis. Electronically Signed   By: Vinnie Langton M.D.   On: 01/22/2017 19:00   Dg Knee Right Port  Result Date: 01/23/2017 CLINICAL DATA:  ORIF right tibia . EXAM: PORTABLE RIGHT KNEE - 1-2 VIEW COMPARISON:  01/23/2017. FINDINGS: ORIF proximal tibia. Hardware intact. Anatomic alignment. Postsurgical changes noted in the soft tissues. IMPRESSION: ORIF proximal right tibia.  Anatomic alignment. Electronically Signed   By: Marcello Moores  Register   On: 01/23/2017 15:06   Dg C-arm 61-120 Min  Result Date: 01/23/2017 CLINICAL DATA:  Fracture fixation EXAM: DG C-ARM 61-120 MIN; RIGHT TIBIA AND  FIBULA - 2 VIEW COMPARISON:  01/2020 and 01/22/2017 FINDINGS: Depressed fracture of the lateral tibial plateau has been reduced now with normal joint space. Lateral plate and screws in good position. IMPRESSION: ORIF lateral tibial plateau fracture with normal joint space on postoperative images. Electronically Signed   By: Franchot Gallo M.D.   On: 01/23/2017 14:00     Results/Tests Pending at Time of Discharge: None Unresulted Labs    Start     Ordered   01/26/17 7353  Basic metabolic panel  Daily,   R    Question:  Specimen collection method  Answer:   Lab=Lab collect   01/25/17 1613   01/24/17 0500  Renal function panel  Daily,   R     01/23/17 1638   01/24/17 0500  Testosterone, % free  (Testosterone, Free, Total (PNL))  Tomorrow morning,   R     01/23/17 1848   Signed and Held  CBC  (heparin)  Once,   R    Comments:  Baseline for heparin therapy IF NOT ALREADY DRAWN.  Notify MD if PLT < 100 K.   Question:  Specimen collection method  Answer:  Lab=Lab collect   Signed and Held   Signed and Held  Creatinine, serum  (heparin)  Once,   R    Comments:  Baseline for heparin therapy IF NOT ALREADY DRAWN.   Question:  Specimen collection method  Answer:  Lab=Lab collect   Signed and Held      Discharge Medications:  Allergies as of 01/27/2017   No Active Allergies     Medication List    TAKE these medications   abacavir 300 MG tablet Commonly known as:  ZIAGEN Take 2 tablets (600 mg total) by mouth daily.   amitriptyline 10 MG tablet Commonly known as:  ELAVIL Take 10 mg by mouth at bedtime.   calcium acetate 667 MG capsule Commonly known as:  PHOSLO Take 2 capsules (1,334 mg total) by mouth 3 (three) times daily with meals.   Cholecalciferol 1000 units tablet Take 1 tablet (1,000 Units total) by mouth daily.   docusate sodium 100 MG capsule Commonly known as:  COLACE Take 1 capsule (100 mg total) by mouth daily as needed for mild constipation.   feeding supplement (NEPRO CARB STEADY) Liqd Take 237 mLs by mouth 3 (three) times daily before meals.   heparin 5000 UNIT/ML injection Inject 1 mL (5,000 Units total) into the skin every 8 (eight) hours.   HYDROcodone-acetaminophen 7.5-325 MG tablet Commonly known as:  NORCO Take 1 tablet by mouth every 4 (four) hours as needed for moderate pain ((score 4 to 6)).   HYDROcodone-acetaminophen 7.5-325 MG tablet Commonly known as:  NORCO Take 2 tablets by mouth every 4 (four) hours as needed for severe pain ((score 7 to 10)).   hydroxychloroquine 200 MG tablet Commonly  known as:  PLAQUENIL Take 1 tablet (200 mg total) by mouth 2 (two) times daily.   lamiVUDine 10 MG/ML solution Commonly known as:  EPIVIR Take 2.5 mLs (25 mg total) by mouth daily at 6 PM. What changed:  how much to take  when to take this   metoCLOPramide 5 MG tablet Commonly known as:  REGLAN Take 1-2 tablets (5-10 mg total) by mouth every 8 (eight) hours as needed for nausea (if ondansetron (ZOFRAN) ineffective.).   multivitamin with minerals Tabs tablet Take 1 tablet by mouth daily.   ondansetron 4 MG tablet Commonly known as:  ZOFRAN Take 1 tablet (  4 mg total) by mouth every 6 (six) hours as needed for nausea.   polyethylene glycol packet Commonly known as:  MIRALAX / GLYCOLAX Take 17 g by mouth 2 (two) times daily.   predniSONE 10 MG tablet Commonly known as:  DELTASONE Take 1 tablet (10 mg total) by mouth daily.   TIVICAY 50 MG tablet Generic drug:  dolutegravir TAKE ONE TABLET (50 MG) BY MOUTH ONCE DAILY. STORE AT ROOM TEMPERATURE. What changed:  Another medication with the same name was changed. Make sure you understand how and when to take each.   dolutegravir 50 MG tablet Commonly known as:  TIVICAY Take 1 tablet (50 mg total) by mouth daily at 6 PM. What changed:  how much to take  how to take this  when to take this  additional instructions       Discharge Instructions: Please refer to Patient Instructions section of EMR for full details.  Patient was counseled important signs and symptoms that should prompt return to medical care, changes in medications, dietary instructions, activity restrictions, and follow up appointments.   Follow-Up Appointments:   Cherly Anderson. Rosalyn Gess, Kim Resident PGY-2 01/27/2017 10:59 AM

## 2017-01-22 NOTE — Consult Note (Signed)
Reason for Consult:Clav and tibia plateau fxs Referring Physician: Lewanda Rife  Kenneth [name] is an 61 y.o. male with ESRD on HD, SLE, HIV, and hx/o DVT. HPI: Kenneth [name] was walking out his house yesterday when he misjudged the last step and tripped, falling onto his right side. He had a brief loss of consciousness. He denied any presyncope. He had immediate pain in his right shoulder and knee and came to the ED for evaluation. X-rays and CT scans showed a right distal clav fx and scap fx as well as a right tibial plateau fx. Orthopedic surgery was consulted.  Past Medical History:  Diagnosis Date  . Achalasia   . Candida infection, esophageal ([place]) 08/2010   a. 2012 - noted on EGD.  . CKD (chronic kidney disease), stage II   . DVT (deep venous thrombosis) ([place]) ~ 04/2014  . Dysphagia 2008   post heller myotomy/toupee fundoplication.    . Hemorrhoids, internal   . History of blood transfusion 03/2014   "low counts"  . History of stomach ulcers   . HIV infection ([place]) dx ~ 2009   a. 02/13/2014 CD4 = 240 - undetectable viral load.  . Hyperlipidemia   . Hypertension   . Insomnia   . Lupus nephritis ([place])   . Pancytopenia ([place])   . Premature ventricular contractions   . Renal abscess   . SLE (systemic lupus erythematosus) ([place]) dx'd 2015    Past Surgical History:  Procedure Laterality Date  . BASCILIC VEIN TRANSPOSITION Left 06/29/2014   Procedure: FIRST STAGE  [place];  Surgeon: Rosetta Posner, MD;  Location: [place];  Service: Vascular;  Laterality: Left;  . BASCILIC VEIN TRANSPOSITION Left 09/11/2014   Procedure: SECOND STAGE BASILIC VEIN TRANSPOSITION LEFT UPPER ARM;  Surgeon: Rosetta Posner, MD;  Location: [facility];  Service: Vascular;  Laterality: Left;  . COLONOSCOPY  2008   .  tortuous colon, internal hemorrhoids.  no polyps or diverticulosis.   [facility] ESOPHAGOGASTRODUODENOSCOPY  08/2010   Dr [name].  08/2010 candida esophagitis, no obvious esophageal stricture. 02/2006 and  05/2006 dilated esophagus, narrowed distal esophagus but no stricture, was empirically Savary dilated  . ESOPHAGOGASTRODUODENOSCOPY N/A 06/27/2014   Procedure: ESOPHAGOGASTRODUODENOSCOPY (EGD);  Surgeon: [name], MD;  Location: [facility] ENDOSCOPY;  Service: Endoscopy;  Laterality: N/A;  . ESOPHAGOGASTRODUODENOSCOPY N/A 07/02/2014   Procedure: ESOPHAGOGASTRODUODENOSCOPY (EGD);  Surgeon: [name], MD;  Location[facility] END[facility]COPY;  Service: Endoscopy;  Laterality: N/A;  with botox  . [place]   with toupee fundoplication of HH.   . INSERTION OF DIALYSIS CATHETER Right 06/29/2014   Procedure: INSERTION OF DIALYSIS CATHETER RIGHT IJ;  Surgeon: Rosetta Posner, MD;  Location: [place];  Service: Vascular;  Laterality: Right;  . NEPHRECTOMY Right ~ 2011    Family History  Problem Relation Age of Onset  . Colon cancer Father        deceased  . Other Mother        s/p pacemaker - alive and well.  . Hypertension Mother   . Other Unknown        5 brothers, 3 sisters - alive and well.    Social History:  reports that he has never smoked. He has never used smokeless tobacco. He reports that he does not drink alcohol or use drugs.  Allergies:  Allergies  Allergen Reactions  . Amoxicillin Rash and Other (See Comments)    REACTION: diffuse rash    Medications: I have reviewed  the patient's current medications.  Results for orders placed or performed during the hospital encounter of 01/22/17 (from the past 48 hour(s))  CBC with Differential     Status: Abnormal   Collection Time: 01/21/17 10:48 PM  Result Value Ref Range   WBC 10.7 (H) 4.0 - 10.5 K/uL   RBC 4.22 4.22 - 5.81 MIL/uL   Hemoglobin 14.2 13.0 - 17.0 g/dL   HCT 42.4 39.0 - 52.0 %   MCV 100.5 (H) 78.0 - 100.0 fL   MCH 33.6 26.0 - 34.0 pg   MCHC 33.5 30.0 - 36.0 g/dL   RDW 15.4 11.5 - 15.5 %   Platelets 168 150 - 400 K/uL   Neutrophils Relative % 77 %   Neutro Abs 8.3 (H) 1.7 - 7.7 K/uL   Lymphocytes Relative 14 %   Lymphs Abs  1.5 0.7 - 4.0 K/uL   Monocytes Relative 9 %   Monocytes Absolute 0.9 0.1 - 1.0 K/uL   Eosinophils Relative 0 %   Eosinophils Absolute 0.0 0.0 - 0.7 K/uL   Basophils Relative 0 %   Basophils Absolute 0.0 0.0 - 0.1 K/uL  Comprehensive metabolic panel     Status: Abnormal   Collection Time: 01/21/17 10:48 PM  Result Value Ref Range   Sodium 137 135 - 145 mmol/L   Potassium 4.9 3.5 - 5.1 mmol/L   Chloride 101 101 - 111 mmol/L   CO2 25 22 - 32 mmol/L   Glucose, Bld 142 (H) 65 - 99 mg/dL   BUN 26 (H) 6 - 20 mg/dL   Creatinine, Ser 10.30 (H) 0.61 - 1.24 mg/dL   Calcium 8.8 (L) 8.9 - 10.3 mg/dL   Total Protein 8.1 6.5 - 8.1 g/dL   Albumin 3.3 (L) 3.5 - 5.0 g/dL   AST 28 15 - 41 U/L   ALT 17 17 - 63 U/L   Alkaline Phosphatase 108 38 - 126 U/L   Total Bilirubin 0.6 0.3 - 1.2 mg/dL   GFR calc non Af Amer 5 (L) >60 mL/min   GFR calc Af Amer 6 (L) >60 mL/min    Comment: (NOTE) The eGFR has been calculated using the CKD EPI equation. This calculation has not been validated in all clinical situations. eGFR's persistently <60 mL/min signify possible Chronic Kidney Disease.    Anion gap 11 5 - 15    Dg Clavicle Right  Result Date: 01/22/2017 CLINICAL DATA:  Right shoulder pain after fall. EXAM: RIGHT CLAVICLE - 2+ VIEWS COMPARISON:  Same day radiographs of the shoulder. FINDINGS: An acute, closed, comminuted intra-articular fracture of the distal clavicle is noted. Slight caudal angulation of the distal fracture fragments. Fracture lucencies are also noted of the included base of the coracoid, scapula above the level of the scapular spine and possibly the glenoid. No shoulder dislocations. The included humeral head and neck are intact. IMPRESSION: 1. Acute, comminuted, intra-articular fracture of the distal clavicle extending into the acromioclavicular joint. Slight caudal angulation of the distal fracture fragments. 2. Additional irregular fracture lucencies at the base of the coracoid and  supraspinous portion of the scapula are noted on these images. 3. A fracture of the glenoid fossa is also seen. Electronically Signed   By: Ashley Royalty M.D.   On: 01/22/2017 03:43   Dg Shoulder Right  Result Date: 01/21/2017 CLINICAL DATA:  61 y/o  M; fall with pain and swelling. EXAM: RIGHT SHOULDER - 2+ VIEW COMPARISON:  None. FINDINGS: Irregularity of distal clavicle at the acromioclavicular  joint with transverse lucency may representing acute fracture. No shoulder dislocation. No proximal humerus fracture. IMPRESSION: Irregularity of distal clavicle at acromioclavicular joint with lucencies may represent acute fracture. Correlate for focal tenderness and consider clavicle radiographs. Electronically Signed   By: Kristine Garbe M.D.   On: 01/21/2017 23:44   Dg Tibia/fibula Right  Result Date: 01/21/2017 CLINICAL DATA:  Status post fall, with right lower leg pain. Initial encounter. EXAM: RIGHT TIBIA AND FIBULA - 2 VIEW COMPARISON:  None. FINDINGS: There is a comminuted fracture involving the lateral tibial plateau, with perhaps 8 mm of depression. A small knee joint effusion is noted. No additional fractures are seen. The ankle mortise is grossly unremarkable in appearance. The soft tissues are otherwise grossly unremarkable in appearance. IMPRESSION: Comminuted fracture involving the lateral tibial plateau, with perhaps 8 mm of depression. Small knee joint effusion noted. Electronically Signed   By: Garald Balding M.D.   On: 01/21/2017 23:50   Ct Head Wo Contrast  Result Date: 01/22/2017 CLINICAL DATA:  Status post fall forward, with right shoulder pain, and concern for head or cervical spine injury. Initial encounter. EXAM: CT HEAD WITHOUT CONTRAST CT CERVICAL SPINE WITHOUT CONTRAST TECHNIQUE: Multidetector CT imaging of the head and cervical spine was performed following the standard protocol without intravenous contrast. Multiplanar CT image reconstructions of the cervical spine were  also generated. COMPARISON:  None. FINDINGS: CT HEAD FINDINGS Brain: No evidence of acute infarction, hemorrhage, hydrocephalus, extra-axial collection or mass lesion/mass effect. The posterior fossa, including the cerebellum, brainstem and fourth ventricle, is within normal limits. The third and lateral ventricles, and basal ganglia are unremarkable in appearance. The cerebral hemispheres are symmetric in appearance, with normal gray-white differentiation. No mass effect or midline shift is seen. Vascular: No hyperdense vessel or unexpected calcification. Skull: There is no evidence of fracture; visualized osseous structures are unremarkable in appearance. Sinuses/Orbits: The orbits are within normal limits. The paranasal sinuses and mastoid air cells are well-aerated. Other: Cerumen is noted filling the right external auditory canal. CT CERVICAL SPINE FINDINGS Alignment: Normal. Skull base and vertebrae: No acute fracture. No primary bone lesion or focal pathologic process. Soft tissues and spinal canal: No prevertebral fluid or swelling. No visible canal hematoma. Disc levels: Minimal disc space narrowing is noted along the lower cervical spine, with small anterior and posterior disc osteophyte complexes. Mild underlying facet disease is noted along the cervical spine. Upper chest: The visualized lung apices are clear. The thyroid gland is unremarkable. Other: No additional soft tissue abnormalities are seen. IMPRESSION: 1. No evidence of traumatic intracranial injury or fracture. 2. No evidence of fracture or subluxation along the cervical spine. 3. Cerumen noted filling the right external auditory canal. Electronically Signed   By: Garald Balding M.D.   On: 01/22/2017 06:39   Ct Cervical Spine Wo Contrast  Result Date: 01/22/2017 CLINICAL DATA:  Status post fall forward, with right shoulder pain, and concern for head or cervical spine injury. Initial encounter. EXAM: CT HEAD WITHOUT CONTRAST CT CERVICAL  SPINE WITHOUT CONTRAST TECHNIQUE: Multidetector CT imaging of the head and cervical spine was performed following the standard protocol without intravenous contrast. Multiplanar CT image reconstructions of the cervical spine were also generated. COMPARISON:  None. FINDINGS: CT HEAD FINDINGS Brain: No evidence of acute infarction, hemorrhage, hydrocephalus, extra-axial collection or mass lesion/mass effect. The posterior fossa, including the cerebellum, brainstem and fourth ventricle, is within normal limits. The third and lateral ventricles, and basal ganglia are unremarkable in appearance. The  cerebral hemispheres are symmetric in appearance, with normal gray-white differentiation. No mass effect or midline shift is seen. Vascular: No hyperdense vessel or unexpected calcification. Skull: There is no evidence of fracture; visualized osseous structures are unremarkable in appearance. Sinuses/Orbits: The orbits are within normal limits. The paranasal sinuses and mastoid air cells are well-aerated. Other: Cerumen is noted filling the right external auditory canal. CT CERVICAL SPINE FINDINGS Alignment: Normal. Skull base and vertebrae: No acute fracture. No primary bone lesion or focal pathologic process. Soft tissues and spinal canal: No prevertebral fluid or swelling. No visible canal hematoma. Disc levels: Minimal disc space narrowing is noted along the lower cervical spine, with small anterior and posterior disc osteophyte complexes. Mild underlying facet disease is noted along the cervical spine. Upper chest: The visualized lung apices are clear. The thyroid gland is unremarkable. Other: No additional soft tissue abnormalities are seen. IMPRESSION: 1. No evidence of traumatic intracranial injury or fracture. 2. No evidence of fracture or subluxation along the cervical spine. 3. Cerumen noted filling the right external auditory canal. Electronically Signed   By: Garald Balding M.D.   On: 01/22/2017 06:39   Ct Knee  Right Wo Contrast  Result Date: 01/22/2017 CLINICAL DATA:  Status post fall while walking up steps, with right knee pain and swelling. Initial encounter. EXAM: CT OF THE RIGHT KNEE WITHOUT CONTRAST TECHNIQUE: Multidetector CT imaging of the right knee was performed according to the standard protocol. Multiplanar CT image reconstructions were also generated. COMPARISON:  Right knee radiographs performed 01/21/2017 FINDINGS: Bones/Joint/Cartilage There is a comminuted fracture involving the lateral tibial plateau, extending into the metadiaphysis, with up to 9 mm of depression. Multiple displaced fracture fragments are noted. There is mild fragmentation involving the lateral aspect of the tibial spine. There is also a small fracture at the lateral aspect of the fibular head. A large lipohemarthrosis is noted. Ligaments Suboptimally assessed by CT. Diffuse edema is noted about the lateral collateral ligament complex. Muscles and Tendons The visualized musculature is grossly unremarkable. Diffuse thickening of the distal quadriceps tendon may reflect some degree of injury. Soft tissues The vasculature is not well assessed without contrast. Soft tissue injury is noted about the knee. IMPRESSION: 1. Comminuted fracture involving the lateral tibial plateau, extending into the metadiaphysis, with up to 9 mm of depression. Mild fragmentation involves the lateral aspect of the tibial spine. 2. Small fracture at the lateral aspect of the fibular head. 3. Large lipohemarthrosis noted. 4. Diffuse thickening of the distal quadriceps tendon may reflect some degree of injury. 5. Diffuse edema about the lateral collateral ligament complex. Ligaments not well assessed on CT. 6. Soft tissue injury about the knee. Electronically Signed   By: Garald Balding M.D.   On: 01/22/2017 04:44   Ct Shoulder Right Wo Contrast  Result Date: 01/22/2017 CLINICAL DATA:  Status post fall forward, with right shoulder pain and swelling. Initial  encounter. EXAM: CT OF THE UPPER RIGHT EXTREMITY WITHOUT CONTRAST TECHNIQUE: Multidetector CT imaging of the upper right extremity was performed according to the standard protocol. COMPARISON:  Right shoulder radiographs performed 01/21/2017 FINDINGS: Bones/Joint/Cartilage There is a mildly displaced fracture involving the distal aspect of the right clavicle, extending into the right acromioclavicular joint. There is also a mildly comminuted fracture involving the superior aspect of the right scapula, extending along the edge of the glenoid. No definite glenohumeral joint effusion is seen. The cartilage is not well assessed on CT. Ligaments Suboptimally assessed by CT. Muscles and Tendons Mild  diffuse soft tissue injury is noted along the right axilla. No definite focal abnormalities are seen with regard to the musculature. Soft tissues No new focal abnormalities are identified. IMPRESSION: 1. Mildly displaced fracture involving the distal aspect of the right clavicle, extending into the right acromioclavicular joint. 2. Mildly comminuted fracture involving the superior aspect of the right scapula, extending along the edge of the glenoid. 3. Mild diffuse soft tissue injury along the right axilla. Electronically Signed   By: Garald Balding M.D.   On: 01/22/2017 06:47    Review of Systems  Constitutional: Negative for weight loss.  HENT: Negative for ear discharge, ear pain, hearing loss and tinnitus.   Eyes: Negative for blurred vision, double vision, photophobia and pain.  Respiratory: Negative for cough, sputum production and shortness of breath.   Cardiovascular: Negative for chest pain.  Gastrointestinal: Negative for abdominal pain, nausea and vomiting.  Genitourinary: Negative for dysuria, flank pain, frequency and urgency.  Musculoskeletal: Positive for joint pain (Right shoulder and knee). Negative for back pain, falls, myalgias and neck pain.  Neurological: Positive for loss of consciousness.  Negative for dizziness, tingling, sensory change, focal weakness and headaches.  Endo/Heme/Allergies: Does not bruise/bleed easily.  Psychiatric/Behavioral: Negative for depression, memory loss and substance abuse. The patient is not nervous/anxious.    Blood pressure (!) 144/80, pulse 88, temperature 98 F (36.7 C), temperature source Oral, resp. rate 16, height 6' 1"  (1.854 m), weight 65.8 kg (145 lb), SpO2 100 %. Physical Exam  Constitutional: He appears well-developed and well-nourished. No distress.  HENT:  Head: Normocephalic.  Eyes: Conjunctivae are normal. Right eye exhibits no discharge. Left eye exhibits no discharge. No scleral icterus.  Neck: Normal range of motion.  Cardiovascular: Normal rate and regular rhythm.   Respiratory: Effort normal. No respiratory distress.  Musculoskeletal:  Right shoulder, elbow, wrist, digits- no skin wounds, diffuse TTP shoulder, in sling, swollen, no instability, no blocks to motion  Sens  Ax/R/M/U intact  Mot   Ax/ R/ PIN/ M/ AIN/ U intact  Rad 2+  Left shoulder, elbow, wrist, digits- no skin wounds, nontender, no instability, no blocks to motion, fistula in upper arm  Sens  Ax/R/M/U intact  Mot   Ax/ R/ PIN/ M/ AIN/ U intact  Rad 2+  RLE No traumatic wounds, ecchymosis, or rash  TTP knee  No ankle effusion, large knee effusion  Sens DPN, SPN, TN intact  Motor EHL, ext, flex, evers 5/5  DP 2+, PT 2+, No significant edema  LLE No traumatic wounds, ecchymosis, or rash  Nontender  No knee or ankle effusion  Knee stable to varus/ valgus and anterior/posterior stress  Sens DPN, SPN, TN intact  Motor EHL, ext, flex, evers 5/5  DP 2+, PT 2+, No significant edema  Neurological: He is alert.  Skin: Skin is warm and dry. He is not diaphoretic.  Psychiatric: He has a normal mood and affect. His behavior is normal.    Assessment/Plan: Fall Right distal clavicle fx -- Sling, NWB Right scap fx -- Does not involve the glenoid (although  close). Is NWB from clav. Right tibia plateau fx -- Will plan on fixation tomorrow by Dr. Marcelino Scot as long as cleared medically. Will be NWB for 8 weeks. Multiple medical problems -- per primary team, will need HD today before surgery tomorrow.    Lisette Abu, PA-C Orthopedic Surgery [phone] 01/22/2017, 10:12 AM

## 2017-01-22 NOTE — ED Notes (Signed)
Patient transported to CT 

## 2017-01-22 NOTE — Consult Note (Signed)
[facility] Renal Consultation Note    Indication for Consultation:  Management of ESRD/hemodialysis; anemia, hypertension/volume and secondary hyperparathyroidism NTZ:GYFVCBS, Bayard Beaver, DO  HPI: Kenneth [name] is a 61 y.o. male. 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  Past medical history significant for HIV, HTN, achalasia, Hx DVT, and Hx R nephrectomy.  Patient is complaint with prescribed HD treatments, last treatment 10/19, tolerated well, without complications, reaching slightly below EDW.   Patient seen and examined at bedside. Admitted to hospital following a fall last night resulting in multiple fractures.  Patient states he was walking down the steps from a friends house, miss stepped on the the final step, losing his balance and falling first on his knee then shoulder and head.  Admits to feeling of dizziness after he started to fall and brief LOC once he was down. Today his pain is 7/10 when lying still.  Denies tripping or dizziness/lightheadedness prior to fall, current chest pain, SOB, n/v/d, weakness, or edema.   Pertinent findings include CT revealing comminuted fracture involving the lateral tibial plateau, mildly displaced fracture of distal aspect of R clavicle, and mildly comminuted fracture of superior aspect of R scapula.   Past Medical History:  Diagnosis Date  . Achalasia   . Candida infection, esophageal ([place]) 08/2010   a. 2012 - noted on EGD.  . CKD (chronic kidney disease), stage II   . DVT (deep venous thrombosis) ([place]) ~ 04/2014  . Dysphagia 2008   post heller myotomy/toupee fundoplication.    . Hemorrhoids, internal   . History of blood transfusion 03/2014   "low counts"  . History of stomach ulcers   . HIV infection ([place]) dx ~ 2009   a. 02/13/2014 CD4 = 240 - undetectable viral load.  . Hyperlipidemia   . Hypertension   . Insomnia   . Lupus nephritis ([place])   . Pancytopenia  ([place])   . Premature ventricular contractions   . Renal abscess   . SLE (systemic lupus erythematosus) ([place]) dx'd 2015   Past Surgical History:  Procedure Laterality Date  . BASCILIC VEIN TRANSPOSITION Left 06/29/2014   Procedure: FIRST STAGE  [place];  Surgeon: Rosetta Posner, MD;  Location: [facility];  Service: Vascular;  Laterality: Left;  . BASCILIC VEIN TRANSPOSITION Left 09/11/2014   Procedure: SECOND STAGE BASILIC VEIN TRANSPOSITION LEFT UPPER ARM;  Surgeon: Rosetta Posner, MD;  Location: [facility];  Service: Vascular;  Laterality: Left;  . COLONOSCOPY  2008   .  tortuous colon, internal hemorrhoids.  no polyps or diverticulosis.   Marland Kitchen ESOPHAGOGASTRODUODENOSCOPY  08/2010   Dr [name].  08/2010 candida esophagitis, no obvious esophageal stricture. 02/2006 and 05/2006 dilated esophagus, narrowed distal esophagus but no stricture, was empirically Savary dilated  . ESOPHAGOGASTRODUODENOSCOPY N/A 06/27/2014   Procedure: ESOPHAGOGASTRODUODENOSCOPY (EGD);  Surgeon: [name], MD;  Location: [facility] ENDOSCOPY;  Service: Endoscopy;  Laterality: N/A;  . ESOPHAGOGASTRODUODENOSCOPY N/A 07/02/2014   Procedure: ESOPHAGOGASTRODUODENOSCOPY (EGD);  Surgeon: [name], MD;  Location: [facility] ENDOSCOPY;  Service: Endoscopy;  Laterality: N/A;  with botox  . [place]   with toupee fundoplication of HH.   . INSERTION OF DIALYSIS CATHETER Right 06/29/2014   Procedure: INSERTION OF DIALYSIS CATHETER RIGHT IJ;  Surgeon: Rosetta Posner, MD;  Location: [facility];  Service: Vascular;  Laterality: Right;  . NEPHRECTOMY Right ~ 2011   Family History  Problem Relation Age of Onset  .  Colon cancer Father        deceased  . Other Mother        s/p pacemaker - alive and well.  . Hypertension Mother   . Other Unknown        5 brothers, 3 sisters - alive and well.   Social History:  reports that he has never smoked. He has never used smokeless tobacco. He reports that he does not drink alcohol or use  drugs. Allergies  Allergen Reactions  . Amoxicillin Rash and Other (See Comments)    REACTION: diffuse rash   Prior to Admission medications   Medication Sig Start Date End Date Taking? Authorizing Provider  abacavir (ZIAGEN) 300 MG tablet Take 2 tablets (600 mg total) by mouth daily. 06/19/16  Yes Campbell Riches, MD  amitriptyline (ELAVIL) 10 MG tablet Take 10 mg by mouth at bedtime.  05/15/16  Yes [provider]  calcium acetate (PHOSLO) 667 MG capsule Take 2 capsules (1,334 mg total) by mouth 3 (three) times daily with meals. 07/06/14  Yes Rumley, [place] N, DO  dolutegravir (TIVICAY) 50 MG tablet TAKE 1 TABLET (50 MG TOTAL) BY MOUTH DAILY. 06/19/16  Yes Campbell Riches, MD  hydroxychloroquine (PLAQUENIL) 200 MG tablet Take 200 mg by mouth 2 (two) times daily. 06/02/15  Yes [provider]  lamiVUDine (EPIVIR) 10 MG/ML solution Take 5 mLs (50 mg total) by mouth daily. 06/19/16  Yes Campbell Riches, MD  Multiple Vitamin (MULTIVITAMIN WITH MINERALS) TABS tablet Take 1 tablet by mouth daily.   Yes [provider]  predniSONE (DELTASONE) 10 MG tablet Take 10 mg by mouth daily. 01/21/17  Yes [provider]  TIVICAY 50 MG tablet TAKE ONE TABLET (50 MG) BY MOUTH ONCE DAILY. STORE AT ROOM TEMPERATURE. Patient not taking: Reported on 01/22/2017 01/19/17   Campbell Riches, MD   Current Facility-Administered Medications  Medication Dose Route Frequency Provider Last Rate Last Dose  . morphine 4 MG/ML injection 4 mg  4 mg Intravenous Q2H PRN Eloise Levels, MD   4 mg at 01/22/17 1112   Current Outpatient Prescriptions  Medication Sig Dispense Refill  . abacavir (ZIAGEN) 300 MG tablet Take 2 tablets (600 mg total) by mouth daily. 60 tablet 5  . amitriptyline (ELAVIL) 10 MG tablet Take 10 mg by mouth at bedtime.     . calcium acetate (PHOSLO) 667 MG capsule Take 2 capsules (1,334 mg total) by mouth 3 (three) times daily with meals. 180 capsule 0  .  dolutegravir (TIVICAY) 50 MG tablet TAKE 1 TABLET (50 MG TOTAL) BY MOUTH DAILY. 30 tablet 5  . hydroxychloroquine (PLAQUENIL) 200 MG tablet Take 200 mg by mouth 2 (two) times daily.  0  . lamiVUDine (EPIVIR) 10 MG/ML solution Take 5 mLs (50 mg total) by mouth daily. 480 mL 5  . Multiple Vitamin (MULTIVITAMIN WITH MINERALS) TABS tablet Take 1 tablet by mouth daily.    . predniSONE (DELTASONE) 10 MG tablet Take 10 mg by mouth daily.    Marland Kitchen TIVICAY 50 MG tablet TAKE ONE TABLET (50 MG) BY MOUTH ONCE DAILY. STORE AT ROOM TEMPERATURE. (Patient not taking: Reported on 01/22/2017) 30 tablet 2    Recent Labs Lab 01/21/17 2248  NA 137  K 4.9  CL 101  CO2 25  GLUCOSE 142*  BUN 26*  CREATININE 10.30*  CALCIUM 8.8*    Recent Labs Lab 01/21/17 2248  AST 28  ALT 17  ALKPHOS 108  BILITOT 0.6  PROT 8.1  ALBUMIN 3.3*   CBC:  Recent Labs Lab 01/21/17 2248  WBC 10.7*  NEUTROABS 8.3*  HGB 14.2  HCT 42.4  MCV 100.5*  PLT 168   Studies/Results: Dg Clavicle Right  Result Date: 01/22/2017 CLINICAL DATA:  Right shoulder pain after fall. EXAM: RIGHT CLAVICLE - 2+ VIEWS COMPARISON:  Same day radiographs of the shoulder. FINDINGS: An acute, closed, comminuted intra-articular fracture of the distal clavicle is noted. Slight caudal angulation of the distal fracture fragments. Fracture lucencies are also noted of the included base of the coracoid, scapula above the level of the scapular spine and possibly the glenoid. No shoulder dislocations. The included humeral head and neck are intact. IMPRESSION: 1. Acute, comminuted, intra-articular fracture of the distal clavicle extending into the acromioclavicular joint. Slight caudal angulation of the distal fracture fragments. 2. Additional irregular fracture lucencies at the base of the coracoid and supraspinous portion of the scapula are noted on these images. 3. A fracture of the glenoid fossa is also seen. Electronically Signed   By: Ashley Royalty M.D.    On: 01/22/2017 03:43   Dg Shoulder Right  Result Date: 01/21/2017 CLINICAL DATA:  61 y/o  M; fall with pain and swelling. EXAM: RIGHT SHOULDER - 2+ VIEW COMPARISON:  None. FINDINGS: Irregularity of distal clavicle at the acromioclavicular joint with transverse lucency may representing acute fracture. No shoulder dislocation. No proximal humerus fracture. IMPRESSION: Irregularity of distal clavicle at acromioclavicular joint with lucencies may represent acute fracture. Correlate for focal tenderness and consider clavicle radiographs. Electronically Signed   By: Kristine Garbe M.D.   On: 01/21/2017 23:44   Dg Tibia/fibula Right  Result Date: 01/21/2017 CLINICAL DATA:  Status post fall, with right lower leg pain. Initial encounter. EXAM: RIGHT TIBIA AND FIBULA - 2 VIEW COMPARISON:  None. FINDINGS: There is a comminuted fracture involving the lateral tibial plateau, with perhaps 8 mm of depression. A small knee joint effusion is noted. No additional fractures are seen. The ankle mortise is grossly unremarkable in appearance. The soft tissues are otherwise grossly unremarkable in appearance. IMPRESSION: Comminuted fracture involving the lateral tibial plateau, with perhaps 8 mm of depression. Small knee joint effusion noted. Electronically Signed   By: Garald Balding M.D.   On: 01/21/2017 23:50   Ct Head Wo Contrast  Result Date: 01/22/2017 CLINICAL DATA:  Status post fall forward, with right shoulder pain, and concern for head or cervical spine injury. Initial encounter. EXAM: CT HEAD WITHOUT CONTRAST CT CERVICAL SPINE WITHOUT CONTRAST TECHNIQUE: Multidetector CT imaging of the head and cervical spine was performed following the standard protocol without intravenous contrast. Multiplanar CT image reconstructions of the cervical spine were also generated. COMPARISON:  None. FINDINGS: CT HEAD FINDINGS Brain: No evidence of acute infarction, hemorrhage, hydrocephalus, extra-axial collection or mass  lesion/mass effect. The posterior fossa, including the cerebellum, brainstem and fourth ventricle, is within normal limits. The third and lateral ventricles, and basal ganglia are unremarkable in appearance. The cerebral hemispheres are symmetric in appearance, with normal gray-white differentiation. No mass effect or midline shift is seen. Vascular: No hyperdense vessel or unexpected calcification. Skull: There is no evidence of fracture; visualized osseous structures are unremarkable in appearance. Sinuses/Orbits: The orbits are within normal limits. The paranasal sinuses and mastoid air cells are well-aerated. Other: Cerumen is noted filling the right external auditory canal. CT CERVICAL SPINE FINDINGS Alignment: Normal. Skull base and vertebrae: No acute fracture. No primary bone lesion or focal pathologic process. Soft tissues and  spinal canal: No prevertebral fluid or swelling. No visible canal hematoma. Disc levels: Minimal disc space narrowing is noted along the lower cervical spine, with small anterior and posterior disc osteophyte complexes. Mild underlying facet disease is noted along the cervical spine. Upper chest: The visualized lung apices are clear. The thyroid gland is unremarkable. Other: No additional soft tissue abnormalities are seen. IMPRESSION: 1. No evidence of traumatic intracranial injury or fracture. 2. No evidence of fracture or subluxation along the cervical spine. 3. Cerumen noted filling the right external auditory canal. Electronically Signed   By: Garald Balding M.D.   On: 01/22/2017 06:39   Ct Cervical Spine Wo Contrast  Result Date: 01/22/2017 CLINICAL DATA:  Status post fall forward, with right shoulder pain, and concern for head or cervical spine injury. Initial encounter. EXAM: CT HEAD WITHOUT CONTRAST CT CERVICAL SPINE WITHOUT CONTRAST TECHNIQUE: Multidetector CT imaging of the head and cervical spine was performed following the standard protocol without intravenous  contrast. Multiplanar CT image reconstructions of the cervical spine were also generated. COMPARISON:  None. FINDINGS: CT HEAD FINDINGS Brain: No evidence of acute infarction, hemorrhage, hydrocephalus, extra-axial collection or mass lesion/mass effect. The posterior fossa, including the cerebellum, brainstem and fourth ventricle, is within normal limits. The third and lateral ventricles, and basal ganglia are unremarkable in appearance. The cerebral hemispheres are symmetric in appearance, with normal gray-white differentiation. No mass effect or midline shift is seen. Vascular: No hyperdense vessel or unexpected calcification. Skull: There is no evidence of fracture; visualized osseous structures are unremarkable in appearance. Sinuses/Orbits: The orbits are within normal limits. The paranasal sinuses and mastoid air cells are well-aerated. Other: Cerumen is noted filling the right external auditory canal. CT CERVICAL SPINE FINDINGS Alignment: Normal. Skull base and vertebrae: No acute fracture. No primary bone lesion or focal pathologic process. Soft tissues and spinal canal: No prevertebral fluid or swelling. No visible canal hematoma. Disc levels: Minimal disc space narrowing is noted along the lower cervical spine, with small anterior and posterior disc osteophyte complexes. Mild underlying facet disease is noted along the cervical spine. Upper chest: The visualized lung apices are clear. The thyroid gland is unremarkable. Other: No additional soft tissue abnormalities are seen. IMPRESSION: 1. No evidence of traumatic intracranial injury or fracture. 2. No evidence of fracture or subluxation along the cervical spine. 3. Cerumen noted filling the right external auditory canal. Electronically Signed   By: Garald Balding M.D.   On: 01/22/2017 06:39   Ct Knee Right Wo Contrast  Result Date: 01/22/2017 CLINICAL DATA:  Status post fall while walking up steps, with right knee pain and swelling. Initial  encounter. EXAM: CT OF THE RIGHT KNEE WITHOUT CONTRAST TECHNIQUE: Multidetector CT imaging of the right knee was performed according to the standard protocol. Multiplanar CT image reconstructions were also generated. COMPARISON:  Right knee radiographs performed 01/21/2017 FINDINGS: Bones/Joint/Cartilage There is a comminuted fracture involving the lateral tibial plateau, extending into the metadiaphysis, with up to 9 mm of depression. Multiple displaced fracture fragments are noted. There is mild fragmentation involving the lateral aspect of the tibial spine. There is also a small fracture at the lateral aspect of the fibular head. A large lipohemarthrosis is noted. Ligaments Suboptimally assessed by CT. Diffuse edema is noted about the lateral collateral ligament complex. Muscles and Tendons The visualized musculature is grossly unremarkable. Diffuse thickening of the distal quadriceps tendon may reflect some degree of injury. Soft tissues The vasculature is not well assessed without contrast. Soft tissue  injury is noted about the knee. IMPRESSION: 1. Comminuted fracture involving the lateral tibial plateau, extending into the metadiaphysis, with up to 9 mm of depression. Mild fragmentation involves the lateral aspect of the tibial spine. 2. Small fracture at the lateral aspect of the fibular head. 3. Large lipohemarthrosis noted. 4. Diffuse thickening of the distal quadriceps tendon may reflect some degree of injury. 5. Diffuse edema about the lateral collateral ligament complex. Ligaments not well assessed on CT. 6. Soft tissue injury about the knee. Electronically Signed   By: Garald Balding M.D.   On: 01/22/2017 04:44   Ct Shoulder Right Wo Contrast  Result Date: 01/22/2017 CLINICAL DATA:  Status post fall forward, with right shoulder pain and swelling. Initial encounter. EXAM: CT OF THE UPPER RIGHT EXTREMITY WITHOUT CONTRAST TECHNIQUE: Multidetector CT imaging of the upper right extremity was performed  according to the standard protocol. COMPARISON:  Right shoulder radiographs performed 01/21/2017 FINDINGS: Bones/Joint/Cartilage There is a mildly displaced fracture involving the distal aspect of the right clavicle, extending into the right acromioclavicular joint. There is also a mildly comminuted fracture involving the superior aspect of the right scapula, extending along the edge of the glenoid. No definite glenohumeral joint effusion is seen. The cartilage is not well assessed on CT. Ligaments Suboptimally assessed by CT. Muscles and Tendons Mild diffuse soft tissue injury is noted along the right axilla. No definite focal abnormalities are seen with regard to the musculature. Soft tissues No new focal abnormalities are identified. IMPRESSION: 1. Mildly displaced fracture involving the distal aspect of the right clavicle, extending into the right acromioclavicular joint. 2. Mildly comminuted fracture involving the superior aspect of the right scapula, extending along the edge of the glenoid. 3. Mild diffuse soft tissue injury along the right axilla. Electronically Signed   By: Garald Balding M.D.   On: 01/22/2017 06:47    ROS: All others negative except those listed in HPI.   Physical Exam: Vitals:   01/22/17 1015 01/22/17 1030 01/22/17 1045 01/22/17 1100  BP: 139/86 (!) 148/95 (!) 145/90 (!) 146/84  Pulse: 92 97 92 88  Resp:      Temp:      TempSrc:      SpO2: 100% 99% 100% 100%  Weight:      Height:         General: WDWN, NAD, thin BM Head: NCAT, sclera not icteric, dry mucus membranes Neck: Supple. No lymphadenopathy Lungs: CTA bilaterally. No wheeze, rales or rhonchi. Breathing is unlabored. Heart: RRR. No murmur, rubs or gallops.  Abdomen: soft, nontender, +BS, no guarding, no rebound tenderness M/S:  Equal strength b/l in upper and lower extremities.  Lower extremities:no edema, or ischemic changes, +pedal pulses, RLE in knee brace. RUE in sling +RP Neuro: A&Ox3. Moves all  extremities spontaneously. Psych:  Responds to questions appropriately with a normal affect. Dialysis Access:LU AVF +bruit,   Dialysis Orders:  MWF - [place].  4hrs, BFR 400, DFR 800,  EDW 65kg, 2K/ 2Ca,   Access: LU AVF  Heparin 5000 Unit bolus IV Venofer 100 mg IV x4HD - 1/4 completed Calcitriol 0.46mcg PO x3wk  Last Labs: 10/17 Hgb 14.5, 10/10: TSAT 23%, K 4.9, Ca 9.2, P 4.1, PTH 574, Alb 3.7  Assessment/Plan: 1.  Multiple fractures (R clavicle, R scapula, R tibia plateau) from mechanical fall - Fixation of tibia frx tomorrow Dr. Marcelino Scot. Ortho following.  2.  ESRD -  MWF schedule, orders written for non urgent HD  today.  3.  Hypertension/volume  - BP elevated, does not appear clinically volume overloaded. Will titrate down volume as tolerated.  4.  Anemia  - Hgb 14.2. Follow 5.  Secondary Hyperparathyroidism -  Ca in goal. Outpatient P in goal. PTH elevated. Continue calcitriol and phoslo. 6.  Nutrition - Alb 3.3. Renal/carb modified diet. NPO after midnight for surgery tomorrow. Renavite 7. HIV - well controlled. per primary.  8. Lupus - on prednisone.    Jen Mow, PA-C [facility] Pager: [phone] 01/22/2017, 11:14 AM   I have seen and examined this patient and agree with plan and assessment in the above note with renal recommendations/intervention highlighted. Fall with multiple fractures, for fixation of tibial plateau fx tomorrow (will be NWB for 8 weeks), sling for clavicle fx. Scapular fx not involving glenoid so no surgery there. For HD today.    Adalida Garver B,MD 01/22/2017 4:28 PM

## 2017-01-22 NOTE — ED Notes (Signed)
Paged ortho tech regarding sling and knee immobilizer

## 2017-01-22 NOTE — Progress Notes (Signed)
Orthopedic Tech Progress Note Patient Details:  Kenneth [name] April 03, 1956 [id number]  Ortho Devices Type of Ortho Device: Knee Immobilizer, Sling immobilizer Ortho Device/Splint Location: rle knee imm., rue sling imm. Ortho Device/Splint Interventions: Ordered, Application, Adjustment   Karolee Stamps 01/22/2017, 6:55 AM

## 2017-01-22 NOTE — H&P (Signed)
[facility] Admission History and Physical Service Pager: [phone]  Patient name: Kenneth [name] Medical record number: [id number] Date of birth: Mar 26, 1956 Age: 61 y.o. Gender: male  Primary Care Provider: Nicolette Bang, DO Consultants: Orthopedic surgery Code Status: Full code  Chief Complaint:   Assessment and Plan: Kenneth [name] is a 61 y.o. male presenting with mechanical fall resulting in multiple fractures. PMH is significant for HIV, ESRD (dialysis MWF), lupus nephritis  R lateral tibial plateau fracture, R displaced distal clavicle fracture, right superior scapular fracture 2/2 mechanical fall Mechanical fall resulted in multiple fractures including nondisplaced R clavicle, R lateral tibial plateau. ED attending spoke with Orthopedics who agreed to operate today. Patient currently NPO. Pain well controlled with morphine. Ortho tech placed sling and knee immobilizer.  -Admit to med/surg under attending Mingo Amber - NPO for surgery - orthopedics consulted; will operate today; appeciate assistance - dilaudid 0.5mg  q3hrs PRN   ESRD Receives dialysis MWF with [facility].  - discussed with Dr. Lorrene Reid, will receive dialysis after surgery  HIV Stable on ziagen, tivicay epivir. Followed by [name].  - continue the above regimen  Hyperlipidemia Assessed by Dr. Juleen China on 07/05/16 and noted to have ASCVD risk >10%.Offered mod intensity statin vs: lifestyle modification. Plan to recheck in one year.   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 Stable - will continue prednisone 10mg  daily - continue plaquinel 200mg  BID   FEN/GI: NPO, intermittent boluses Prophylaxis: ? Unsure when ortho will be operating, has R knee immobilizer making SCDs difficult. Would ideally have on heparin  Disposition: admit to med-surg, discharge pending improvement  History of Present Illness:  Kenneth [name] is a 61 y.o. male presenting after a fall. He  was walking out of a friends house and miscalculated the bottom step and then fell on right leg and then right shoulder and head. He thinks that he may have lost consciousness. He reports that his friends do not think that he did. He was bleeding from the right side of his head just at the brow line. He tried to stand up and had significant pain. His friends ended up driving him to the ED. Denies any alcohol or drug use that night.  Had clavicle, shoulder and tib/fib xrays showing fracture and had CT knee and shoulder showing fractures of the R lateral tibial plateau, small fracture at lateral aspect of fibular head, displaced fracture of distal aspect of right clavicle, mildly communited fracture of superior aspect of right scapula. Also had negative head CT and CT c-spine. Has been given percocet with minimal improvement and morphine 4mg . Does not appear fluid overloaded.   Review Of Systems: Per HPI   ROS  Patient Active Problem List   Diagnosis Date Noted  . Chronic fatigue 03/11/2015  . Exacerbation of systemic lupus ([place]) 02/24/2015  . Depressed mood 02/18/2015  . Unintentional weight loss 02/18/2015  . S/P dialysis catheter insertion ([place])   . Thrombocytopenia ([place])   . Dysphagia   . ESRD (end stage renal disease) ([place])   . Anasarca 06/22/2014  . DVT (deep venous thrombosis) ([place]) 06/22/2014  . PVC's (premature ventricular contractions) 03/17/2014  . Diarrhea   . Abdominal pain   . Protein-calorie malnutrition, severe ([place]) 02/11/2014  . Lupus nephritis ([place])   . Arthralgia of multiple sites, bilateral 02/02/2014  . Joint swelling 02/01/2014  . Bilateral low back pain without sciatica 02/01/2014  . EXTERNAL HEMORRHOIDS WITHOUT MENTION COMP 01/20/2009  . HIV disease ([place]) 07/24/2006  .  Hyperlipidemia 05/31/2006  . ERECTILE DYSFUNCTION 05/31/2006  . INSOMNIA NOS 05/31/2006    Past Medical History: Past Medical History:  Diagnosis Date  . Achalasia   . Candida infection,  esophageal ([place]) 08/2010   a. 2012 - noted on EGD.  . CKD (chronic kidney disease), stage II   . DVT (deep venous thrombosis) ([place]) ~ 04/2014  . Dysphagia 2008   post heller myotomy/toupee fundoplication.    . Hemorrhoids, internal   . History of blood transfusion 03/2014   "low counts"  . History of stomach ulcers   . HIV infection ([place]) dx ~ 2009   a. 02/13/2014 CD4 = 240 - undetectable viral load.  . Hyperlipidemia   . Hypertension   . Insomnia   . Lupus nephritis ([place])   . Pancytopenia ([place])   . Premature ventricular contractions   . Renal abscess   . SLE (systemic lupus erythematosus) ([place]) dx'd 2015    Past Surgical History: Past Surgical History:  Procedure Laterality Date  . BASCILIC VEIN TRANSPOSITION Left 06/29/2014   Procedure: FIRST STAGE  [place];  Surgeon: Rosetta Posner, MD;  Location: [facility];  Service: Vascular;  Laterality: Left;  . BASCILIC VEIN TRANSPOSITION Left 09/11/2014   Procedure: SECOND STAGE BASILIC VEIN TRANSPOSITION LEFT UPPER ARM;  Surgeon: Rosetta Posner, MD;  Location: [place];  Service: Vascular;  Laterality: Left;  . COLONOSCOPY  2008   .  tortuous colon, internal hemorrhoids.  no polyps or diverticulosis.   Marland Kitchen ESOPHAGOGASTRODUODENOSCOPY  08/2010   Dr [name].  08/2010 candida esophagitis, no obvious esophageal stricture. 02/2006 and 05/2006 dilated esophagus, narrowed distal esophagus but no stricture, was empirically [place] dilated  . ESOPHAGOGASTRODUODENOSCOPY N/A 06/27/2014   Procedure: ESOPHAGOGASTRODUODENOSCOPY (EGD);  Surgeon: [name], MD;  [facility] ENDOSCOPY;  Service: Endoscopy;  Laterality: N/A;  . ESOPHAGOGASTRODUODENOSCOPY N/A 07/02/2014   Procedure: ESOPHAGOGASTRODUODENOSCOPY (EGD);  Surgeon: [name], MD;  Location: [facility] ENDOSCOPY;  Service: Endoscopy;  Laterality: N/A;  with botox  . Comern­o   with toupee fundoplication of HH.   . INSERTION OF DIALYSIS CATHETER Right 06/29/2014   Procedure: INSERTION OF  DIALYSIS CATHETER RIGHT IJ;  Surgeon: Rosetta Posner, MD;  Location: [place];  Service: Vascular;  Laterality: Right;  . NEPHRECTOMY Right ~ 2011    Social History: Social History  Substance Use Topics  . Smoking status: Never Smoker  . Smokeless tobacco: Never Used  . Alcohol use No    Family History: Family History  Problem Relation Age of Onset  . Colon cancer Father        deceased  . Other Mother        s/p pacemaker - alive and well.  . Hypertension Mother   . Other Unknown        5 brothers, 3 sisters - alive and well.    Allergies and Medications: Allergies  Allergen Reactions  . Amoxicillin Rash and Other (See Comments)    REACTION: diffuse rash   No current facility-administered medications on file prior to encounter.    Current Outpatient Prescriptions on File Prior to Encounter  Medication Sig Dispense Refill  . abacavir (ZIAGEN) 300 MG tablet Take 2 tablets (600 mg total) by mouth daily. 60 tablet 5  . amitriptyline (ELAVIL) 10 MG tablet Take 10 mg by mouth at bedtime.     . calcium acetate (PHOSLO) 667 MG capsule Take 2 capsules (1,334 mg total) by mouth 3 (three) times daily with meals.  180 capsule 0  . dolutegravir (TIVICAY) 50 MG tablet TAKE 1 TABLET (50 MG TOTAL) BY MOUTH DAILY. 30 tablet 5  . hydroxychloroquine (PLAQUENIL) 200 MG tablet Take 200 mg by mouth 2 (two) times daily.  0  . lamiVUDine (EPIVIR) 10 MG/ML solution Take 5 mLs (50 mg total) by mouth daily. 480 mL 5  . Multiple Vitamin (MULTIVITAMIN WITH MINERALS) TABS tablet Take 1 tablet by mouth daily.    Marland Kitchen TIVICAY 50 MG tablet TAKE ONE TABLET (50 MG) BY MOUTH ONCE DAILY. STORE AT ROOM TEMPERATURE. (Patient not taking: Reported on 01/22/2017) 30 tablet 2    Objective: BP 132/81 (BP Location: Right Arm)   Pulse 88   Temp 98 F (36.7 C) (Oral)   Resp 16   Ht 6\' 1"  (1.854 m)   Wt 145 lb (65.8 kg)   SpO2 100%   BMI 19.13 kg/m  Exam: General: 61yo M lying in bed appearing uncomfortable, but in  NAD Eyes: repair laceration on superior orbit R side,  HEENT: Laceration as above, otherwise AT/[place], EOMI, no-scleral icterus, no nasal drainage, MMM, clear oropharynx Neck: supple, nop LAD Cardiovascular: RRR, s1/s2 present, no MRG Respiratory: NWOB, CTABL, no wheezing or rhonchi, minimal crackles at the bilateral bases Gastrointestinal: Soft, non-tender, non-distended.  MSK: no gross deformities or edema. R arm in immobilizer sling and has severe TTP at area of [facility] joint on right side and at distal aspect of R clavicle. No surface level abnormalities. Did not assess for ROM d/t fractures. Full AROM/PROM R hand. R leg with knee immobilizer in place. Able to move toes and has full AROM/PROM of feet and ankles and denies pain in the bilateral hips. ROM difficult to assess in RLE d/t fractures Derm: warm and dry, no rashes Neuro: AAOx3. CN2-12 WNL. Grip strength 5/5 bilaterally. No changes in sensation throughout. 2+ palpable pulses at radial arteries and DP and PT Psych: Normal mood and affect. Pleasant.   Labs and Imaging: CBC BMET   Recent Labs Lab 01/21/17 2248  WBC 10.7*  HGB 14.2  HCT 42.4  PLT 168    Recent Labs Lab 01/21/17 2248  NA 137  K 4.9  CL 101  CO2 25  BUN 26*  CREATININE 10.30*  GLUCOSE 142*  CALCIUM 8.8Eloise Levels, MD 01/22/2017, 8:50 AM PGY-2, [place] [facility] pager: [phone], text pages welcome

## 2017-01-22 NOTE — ED Notes (Signed)
Pt transported to XR.  

## 2017-01-22 NOTE — ED Provider Notes (Signed)
[facility] DEPARTMENT Provider Note   CSN: [id number] Arrival date & time: 01/21/17  2229     History   Chief Complaint Chief Complaint  Patient presents with  . Fall    HPI Kenneth [name] is a 61 y.o. 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  Pt reports he was walking down a set of stairs when he missed the last step and fell forward onto the concrete.  He reports he was unable to catch himself and hit his right knee, right shoulder and face.  He denies LOC.  He was unable to ambulate after the incident.  He reports associated laceration to the right face, but denies headache, neck pain, back pain, numbness, weakness, loss of bowel or bladder.  Pt reports compliance with his home medications.  His last dialysis was Friday and was without complication.  No CP, SOB or weakness prior to the fall.  .     The history is provided by the patient and medical records. No language interpreter was used.    Past Medical History:  Diagnosis Date  . Achalasia   . Candida infection, esophageal ([place]) 08/2010   a. 2012 - noted on EGD.  . CKD (chronic kidney disease), stage II   . DVT (deep venous thrombosis) ([place]) ~ 04/2014  . Dysphagia 2008   post heller myotomy/toupee fundoplication.    . Hemorrhoids, internal   . History of blood transfusion 03/2014   "low counts"  . History of stomach ulcers   . HIV infection ([place]) dx ~ 2009   a. 02/13/2014 CD4 = 240 - undetectable viral load.  . Hyperlipidemia   . Hypertension   . Insomnia   . Lupus nephritis ([place])   . Pancytopenia ([place])   . Premature ventricular contractions   . Renal abscess   . SLE (systemic lupus erythematosus) ([place]) dx'd 2015    Patient Active Problem List   Diagnosis Date Noted  . Chronic fatigue 03/11/2015  . Exacerbation of systemic  lupus ([place]) 02/24/2015  . Depressed mood 02/18/2015  . Unintentional weight loss 02/18/2015  . S/P dialysis catheter insertion ([place])   . Thrombocytopenia ([place])   . Dysphagia   . ESRD (end stage renal disease) ([place])   . Anasarca 06/22/2014  . DVT (deep venous thrombosis) ([place]) 06/22/2014  . PVC's (premature ventricular contractions) 03/17/2014  . Diarrhea   . Abdominal pain   . Protein-calorie malnutrition, severe ([place]) 02/11/2014  . Lupus nephritis ([place])   . Arthralgia of multiple sites, bilateral 02/02/2014  . Joint swelling 02/01/2014  . Bilateral low back pain without sciatica 02/01/2014  . EXTERNAL HEMORRHOIDS WITHOUT MENTION COMP 01/20/2009  . HIV disease ([place]) 07/24/2006  . Hyperlipidemia 05/31/2006  . ERECTILE DYSFUNCTION 05/31/2006  . INSOMNIA NOS 05/31/2006    Past Surgical History:  Procedure Laterality Date  . BASCILIC VEIN TRANSPOSITION Left 06/29/2014   Procedure: FIRST STAGE  [place];  Surgeon: Rosetta Posner, MD;  Location: [facility];  Service: Vascular;  Laterality: Left;  . BASCILIC VEIN TRANSPOSITION Left 09/11/2014   Procedure: SECOND STAGE BASILIC VEIN TRANSPOSITION LEFT UPPER ARM;  Surgeon: Rosetta Posner, MD;  Location: [facility];  Service: Vascular;  Laterality: Left;  . COLONOSCOPY  2008   .  tortuous colon, internal hemorrhoids.  no polyps or diverticulosis.   Marland Kitchen  ESOPHAGOGASTRODUODENOSCOPY  08/2010   Dr [name].  08/2010 candida esophagitis, no obvious esophageal stricture. 02/2006 and 05/2006 dilated esophagus, narrowed distal esophagus but no stricture, was empirically [place] dilated  . ESOPHAGOGASTRODUODENOSCOPY N/A 06/27/2014   Procedure: ESOPHAGOGASTRODUODENOSCOPY (EGD);  Surgeon: [name], MD;  Location[facility]COPY;  Service: Endoscopy;  Laterality: N/A;  . ESOPHAGOGASTRODUODENOSCOPY N/A 07/02/2014   Procedure: ESOPHAGOGASTRODUODENOSCOPY (EGD);  Surgeon: [name], MD;  Location: [facility] ENDOSCOPY;  Service: Endoscopy;  Laterality: N/A;  with botox    . [place]   with toupee fundoplication of HH.   . INSERTION OF DIALYSIS CATHETER Right 06/29/2014   Procedure: INSERTION OF DIALYSIS CATHETER RIGHT IJ;  Surgeon: Rosetta Posner, MD;  Location: [facility];  Service: Vascular;  Laterality: Right;  . NEPHRECTOMY Right ~ 2011       Home Medications    Prior to Admission medications   Medication Sig Start Date End Date Taking? Authorizing Provider  abacavir (ZIAGEN) 300 MG tablet Take 2 tablets (600 mg total) by mouth daily. 06/19/16  Yes Campbell Riches, MD  amitriptyline (ELAVIL) 10 MG tablet Take 10 mg by mouth at bedtime.  05/15/16  Yes [provider]  calcium acetate (PHOSLO) 667 MG capsule Take 2 capsules (1,334 mg total) by mouth 3 (three) times daily with meals. 07/06/14  Yes Rumley, [place] N, DO  dolutegravir (TIVICAY) 50 MG tablet TAKE 1 TABLET (50 MG TOTAL) BY MOUTH DAILY. 06/19/16  Yes Campbell Riches, MD  hydroxychloroquine (PLAQUENIL) 200 MG tablet Take 200 mg by mouth 2 (two) times daily. 06/02/15  Yes [provider]  lamiVUDine (EPIVIR) 10 MG/ML solution Take 5 mLs (50 mg total) by mouth daily. 06/19/16  Yes Campbell Riches, MD  Multiple Vitamin (MULTIVITAMIN WITH MINERALS) TABS tablet Take 1 tablet by mouth daily.   Yes [provider]  predniSONE (DELTASONE) 10 MG tablet Take 10 mg by mouth daily. 01/21/17  Yes [provider]  TIVICAY 50 MG tablet TAKE ONE TABLET (50 MG) BY MOUTH ONCE DAILY. STORE AT ROOM TEMPERATURE. Patient not taking: Reported on 01/22/2017 01/19/17   Campbell Riches, MD    Family History Family History  Problem Relation Age of Onset  . Colon cancer Father        deceased  . Other Mother        s/p pacemaker - alive and well.  . Hypertension Mother   . Other Unknown        5 brothers, 3 sisters - alive and well.    Social History Social History  Substance Use Topics  . Smoking status: Never Smoker  . Smokeless tobacco: Never Used  . Alcohol  use No     Allergies   Amoxicillin   Review of Systems Review of Systems  Constitutional: Negative for appetite change, diaphoresis, fatigue, fever and unexpected weight change.  HENT: Negative for mouth sores.   Eyes: Negative for visual disturbance.  Respiratory: Negative for cough, chest tightness, shortness of breath and wheezing.   Cardiovascular: Negative for chest pain.  Gastrointestinal: Negative for abdominal pain, constipation, diarrhea, nausea and vomiting.  Endocrine: Negative for polydipsia, polyphagia and polyuria.  Genitourinary: Negative for dysuria, frequency, hematuria and urgency.  Musculoskeletal: Positive for arthralgias and joint swelling. Negative for back pain and neck stiffness.  Skin: Positive for wound. Negative for rash.  Allergic/Immunologic: Negative for immunocompromised state.  Neurological: Negative for syncope, light-headedness and headaches.  Hematological: Does not bruise/bleed easily.  Psychiatric/Behavioral: Negative for  sleep disturbance. The patient is not nervous/anxious.      Physical Exam Updated Vital Signs BP 121/79   Pulse 88   Temp 98 F (36.7 C) (Oral)   Resp 18   Ht 6\' 1"  (1.854 m)   Wt 65.8 kg (145 lb)   SpO2 100%   BMI 19.13 kg/m   Physical Exam  Constitutional: He appears well-developed and well-nourished. He appears distressed ( uncomfrotable appearing).  Awake, alert, nontoxic appearance  HENT:  Head: Normocephalic. Head is with laceration ( right eyebrow - ).  Mouth/Throat: Oropharynx is clear and moist. No oropharyngeal exudate.  1.5cm laceration to the right eyebrow  Eyes: Conjunctivae and EOM are normal. No scleral icterus.  Neck: Normal range of motion. Neck supple. No spinous process tenderness and no muscular tenderness present. Normal range of motion present.  Cardiovascular: Normal rate, regular rhythm and intact distal pulses.   Pulses:      Radial pulses are 2+ on the right side, and 2+ on the left  side.       Dorsalis pedis pulses are 2+ on the right side, and 2+ on the left side.  Pulmonary/Chest: Effort normal and breath sounds normal. No respiratory distress. He has no wheezes.  Equal chest expansion  Abdominal: Soft. Bowel sounds are normal. He exhibits distension ( mild). He exhibits no mass. There is no tenderness. There is no rebound and no guarding.  Musculoskeletal: He exhibits no edema.       Right shoulder: He exhibits decreased range of motion, tenderness ( along the distal clavicle), swelling ( minor) and pain. He exhibits no deformity and no laceration.       Right knee: He exhibits decreased range of motion, swelling, effusion ( large) and ecchymosis. He exhibits no laceration. Tenderness found. Medial joint line tenderness noted.  Neurological: He is alert. No sensory deficit. GCS eye subscore is 4. GCS verbal subscore is 5. GCS motor subscore is 6.  Speech is clear and goal oriented Moves extremities without ataxia Strength in the LUE and LLE 5/5; grip strength equal bilaterally  Skin: Skin is warm and dry. He is not diaphoretic.  Psychiatric: He has a normal mood and affect.  Nursing note and vitals reviewed.    ED Treatments / Results  Labs (all labs ordered are listed, but only abnormal results are displayed) Labs Reviewed  CBC WITH DIFFERENTIAL/PLATELET - Abnormal; Notable for the following:       Result Value   WBC 10.7 (*)    MCV 100.5 (*)    Neutro Abs 8.3 (*)    All other components within normal limits  COMPREHENSIVE METABOLIC PANEL - Abnormal; Notable for the following:    Glucose, Bld 142 (*)    BUN 26 (*)    Creatinine, Ser 10.30 (*)    Calcium 8.8 (*)    Albumin 3.3 (*)    GFR calc non Af Amer 5 (*)    GFR calc Af Amer 6 (*)    All other components within normal limits    Radiology Dg Clavicle Right  Result Date: 01/22/2017 CLINICAL DATA:  Right shoulder pain after fall. EXAM: RIGHT CLAVICLE - 2+ VIEWS COMPARISON:  Same day radiographs  of the shoulder. FINDINGS: An acute, closed, comminuted intra-articular fracture of the distal clavicle is noted. Slight caudal angulation of the distal fracture fragments. Fracture lucencies are also noted of the included base of the coracoid, scapula above the level of the scapular spine and possibly the glenoid. No  shoulder dislocations. The included humeral head and neck are intact. IMPRESSION: 1. Acute, comminuted, intra-articular fracture of the distal clavicle extending into the acromioclavicular joint. Slight caudal angulation of the distal fracture fragments. 2. Additional irregular fracture lucencies at the base of the coracoid and supraspinous portion of the scapula are noted on these images. 3. A fracture of the glenoid fossa is also seen. Electronically Signed   By: Ashley Royalty M.D.   On: 01/22/2017 03:43   Dg Shoulder Right  Result Date: 01/21/2017 CLINICAL DATA:  61 y/o  M; fall with pain and swelling. EXAM: RIGHT SHOULDER - 2+ VIEW COMPARISON:  None. FINDINGS: Irregularity of distal clavicle at the acromioclavicular joint with transverse lucency may representing acute fracture. No shoulder dislocation. No proximal humerus fracture. IMPRESSION: Irregularity of distal clavicle at acromioclavicular joint with lucencies may represent acute fracture. Correlate for focal tenderness and consider clavicle radiographs. Electronically Signed   By: Kristine Garbe M.D.   On: 01/21/2017 23:44   Dg Tibia/fibula Right  Result Date: 01/21/2017 CLINICAL DATA:  Status post fall, with right lower leg pain. Initial encounter. EXAM: RIGHT TIBIA AND FIBULA - 2 VIEW COMPARISON:  None. FINDINGS: There is a comminuted fracture involving the lateral tibial plateau, with perhaps 8 mm of depression. A small knee joint effusion is noted. No additional fractures are seen. The ankle mortise is grossly unremarkable in appearance. The soft tissues are otherwise grossly unremarkable in appearance. IMPRESSION:  Comminuted fracture involving the lateral tibial plateau, with perhaps 8 mm of depression. Small knee joint effusion noted. Electronically Signed   By: Garald Balding M.D.   On: 01/21/2017 23:50   Ct Knee Right Wo Contrast  Result Date: 01/22/2017 CLINICAL DATA:  Status post fall while walking up steps, with right knee pain and swelling. Initial encounter. EXAM: CT OF THE RIGHT KNEE WITHOUT CONTRAST TECHNIQUE: Multidetector CT imaging of the right knee was performed according to the standard protocol. Multiplanar CT image reconstructions were also generated. COMPARISON:  Right knee radiographs performed 01/21/2017 FINDINGS: Bones/Joint/Cartilage There is a comminuted fracture involving the lateral tibial plateau, extending into the metadiaphysis, with up to 9 mm of depression. Multiple displaced fracture fragments are noted. There is mild fragmentation involving the lateral aspect of the tibial spine. There is also a small fracture at the lateral aspect of the fibular head. A large lipohemarthrosis is noted. Ligaments Suboptimally assessed by CT. Diffuse edema is noted about the lateral collateral ligament complex. Muscles and Tendons The visualized musculature is grossly unremarkable. Diffuse thickening of the distal quadriceps tendon may reflect some degree of injury. Soft tissues The vasculature is not well assessed without contrast. Soft tissue injury is noted about the knee. IMPRESSION: 1. Comminuted fracture involving the lateral tibial plateau, extending into the metadiaphysis, with up to 9 mm of depression. Mild fragmentation involves the lateral aspect of the tibial spine. 2. Small fracture at the lateral aspect of the fibular head. 3. Large lipohemarthrosis noted. 4. Diffuse thickening of the distal quadriceps tendon may reflect some degree of injury. 5. Diffuse edema about the lateral collateral ligament complex. Ligaments not well assessed on CT. 6. Soft tissue injury about the knee. Electronically  Signed   By: Garald Balding M.D.   On: 01/22/2017 04:44    Procedures .Marland KitchenLaceration Repair Date/Time: 01/22/2017 5:51 AM Performed by: Abigail Butts Authorized by: Abigail Butts   Consent:    Consent obtained:  Verbal   Consent given by:  Patient   Risks discussed:  Infection, pain  and poor cosmetic result   Alternatives discussed:  No treatment Anesthesia (see MAR for exact dosages):    Anesthesia method:  Local infiltration   Local anesthetic:  Lidocaine 1% WITH epi (56mL) Laceration details:    Location:  Face   Face location:  R eyebrow   Length (cm):  1.5 Repair type:    Repair type:  Simple Pre-procedure details:    Preparation:  Patient was prepped and draped in usual sterile fashion Exploration:    Hemostasis achieved with:  Epinephrine and direct pressure   Wound exploration: entire depth of wound probed and visualized   Treatment:    Area cleansed with:  Saline   Amount of cleaning:  Standard   Irrigation solution:  Sterile water   Irrigation volume:  200   Irrigation method:  Syringe Skin repair:    Repair method:  Sutures   Suture size:  5-0   Suture material:  Prolene   Suture technique:  Simple interrupted   Number of sutures:  3 Approximation:    Approximation:  Close   Vermilion border: well-aligned   Post-procedure details:    Dressing:  Open (no dressing)   Patient tolerance of procedure:  Tolerated well, no immediate complications   (including critical care time)  Medications Ordered in ED Medications  oxyCODONE-acetaminophen (PERCOCET/ROXICET) 5-325 MG per tablet 2 tablet (2 tablets Oral Given 01/22/17 0352)  lidocaine-EPINEPHrine (XYLOCAINE W/EPI) 2 %-1:100000 (with pres) injection 20 mL (20 mLs Infiltration Given 01/22/17 0353)  morphine 4 MG/ML injection 4 mg (4 mg Intravenous Given 01/22/17 0558)     Initial Impression / Assessment and Plan / ED Course  I have reviewed the triage vital signs and the nursing  notes.  Pertinent labs & imaging results that were available during my care of the patient were reviewed by me and considered in my medical decision making (see chart for details).  Clinical Course as of Jan 22 599  Mon Jan 22, 2017  0550 Discussed with Dr. Doran Durand who will consult. Pt is to be admitted to the hospitalist  [HM]    Clinical Course User Index [HM] Mikaeel Petrow, Jarrett Soho, PA-C    Pt presents after a chemical fall down several stairs. Patient denies headache, vision changes, loss of consciousness or neck pain. He does have a small laceration to the right eyebrow which has been repaired. Patient found to have significant fracture of the distal right clavicle extending down into the base of the coracoid process, scapula and glenoid fossa.  Patient also with right tibial plateau fracture. Due to patient's complement medical history and inability to care for himself with right shoulder and right leg fractures he will need admission. Discussed with orthopedics who will plan for repair today. Also discussed with family practice who will admit.  The patient was discussed with and seen by Dr. Roxanne Mins who agrees with the treatment plan.   Final Clinical Impressions(s) / ED Diagnoses   Final diagnoses:  Swelling  Pain  Fall  Closed displaced fracture of acromial end of right clavicle, initial encounter  Closed fracture of right tibial plateau, initial encounter  Laceration of eyebrow and forehead, right, initial encounter    New Prescriptions New Prescriptions   No medications on file     Agapito Games 79/48/01 6553    Delora Fuel, MD 74/82/70 [phone]

## 2017-01-22 NOTE — Procedures (Signed)
I have personally attended this patient's dialysis session.   Tight heparin 2K bath (K 4.9) 2L goal close to met Only issue mildy tachy toward end of TMT  Jamal Maes, MD [place] Pager 01/22/2017, 5:07 PM

## 2017-01-23 ENCOUNTER — Inpatient Hospital Stay (HOSPITAL_COMMUNITY): Payer: BC Managed Care – PPO

## 2017-01-23 ENCOUNTER — Encounter (HOSPITAL_COMMUNITY): Payer: Self-pay | Admitting: Anesthesiology

## 2017-01-23 ENCOUNTER — Inpatient Hospital Stay (HOSPITAL_COMMUNITY)
Admission: RE | Admit: 2017-01-23 | Payer: BC Managed Care – PPO | Source: Ambulatory Visit | Admitting: Orthopedic Surgery

## 2017-01-23 ENCOUNTER — Encounter (HOSPITAL_COMMUNITY)
Admission: EM | Disposition: A | Discharge status: Rehabilitation Facility | Payer: Self-pay | Source: Home / Self Care | Attending: Family Medicine

## 2017-01-23 ENCOUNTER — Inpatient Hospital Stay (HOSPITAL_COMMUNITY): Payer: BC Managed Care – PPO | Admitting: Anesthesiology

## 2017-01-23 DIAGNOSIS — S82141A Displaced bicondylar fracture of right tibia, initial encounter for closed fracture: Secondary | ICD-10-CM

## 2017-01-23 DIAGNOSIS — S42031A Displaced fracture of lateral end of right clavicle, initial encounter for closed fracture: Secondary | ICD-10-CM

## 2017-01-23 HISTORY — PX: ORIF TIBIA PLATEAU: SHX2132

## 2017-01-23 LAB — MRSA PCR SCREENING: MRSA by PCR: NEGATIVE

## 2017-01-23 LAB — CBC
HEMATOCRIT: 39.3 % (ref 39.0–52.0)
Hemoglobin: 13 g/dL (ref 13.0–17.0)
MCH: 33.3 pg (ref 26.0–34.0)
MCHC: 33.1 g/dL (ref 30.0–36.0)
MCV: 100.8 fL — AB (ref 78.0–100.0)
PLATELETS: 164 10*3/uL (ref 150–400)
RBC: 3.9 MIL/uL — ABNORMAL LOW (ref 4.22–5.81)
RDW: 15.2 % (ref 11.5–15.5)
WBC: 13.9 10*3/uL — ABNORMAL HIGH (ref 4.0–10.5)

## 2017-01-23 LAB — HEPATITIS B SURFACE ANTIGEN: Hepatitis B Surface Ag: NEGATIVE

## 2017-01-23 LAB — POCT I-STAT 4, (NA,K, GLUC, HGB,HCT)
Glucose, Bld: 80 mg/dL (ref 65–99)
HCT: 39 % (ref 39.0–52.0)
Hemoglobin: 13.3 g/dL (ref 13.0–17.0)
POTASSIUM: 4.9 mmol/L (ref 3.5–5.1)
SODIUM: 138 mmol/L (ref 135–145)

## 2017-01-23 SURGERY — OPEN REDUCTION INTERNAL FIXATION (ORIF) TIBIAL PLATEAU
Anesthesia: General | Site: Leg Lower | Laterality: Right

## 2017-01-23 MED ORDER — ROCURONIUM BROMIDE 100 MG/10ML IV SOLN
INTRAVENOUS | Status: DC | PRN
  Administered 2017-01-23: 20 mg via INTRAVENOUS
  Administered 2017-01-23 (×3): 10 mg via INTRAVENOUS
  Administered 2017-01-23: 50 mg via INTRAVENOUS
  Administered 2017-01-23: 10 mg via INTRAVENOUS

## 2017-01-23 MED ORDER — MEPERIDINE HCL 25 MG/ML IJ SOLN: 6.2500 mg | INTRAMUSCULAR | Status: DC | PRN

## 2017-01-23 MED ORDER — FENTANYL CITRATE (PF) 250 MCG/5ML IJ SOLN
INTRAMUSCULAR | Status: AC
  Filled 2017-01-23: qty 5

## 2017-01-23 MED ORDER — ONDANSETRON HCL 4 MG/2ML IJ SOLN
INTRAMUSCULAR | Status: DC | PRN
  Administered 2017-01-23: 4 mg via INTRAVENOUS

## 2017-01-23 MED ORDER — ONDANSETRON HCL 4 MG/2ML IJ SOLN: 4.0000 mg | Freq: Four times a day (QID) | INTRAMUSCULAR | Status: DC | PRN

## 2017-01-23 MED ORDER — 0.9 % SODIUM CHLORIDE (POUR BTL) OPTIME
TOPICAL | Status: DC | PRN
  Administered 2017-01-23: 1000 mL

## 2017-01-23 MED ORDER — SODIUM CHLORIDE 0.9 % IV SOLN
INTRAVENOUS | Status: DC
  Administered 2017-01-23 (×2): via INTRAVENOUS

## 2017-01-23 MED ORDER — LAMIVUDINE 10 MG/ML PO SOLN
50.0000 mg | Freq: Every day | ORAL | Status: DC
  Filled 2017-01-23: qty 5

## 2017-01-23 MED ORDER — POTASSIUM CHLORIDE IN NACL 20-0.9 MEQ/L-% IV SOLN: INTRAVENOUS | Status: DC

## 2017-01-23 MED ORDER — LAMIVUDINE 10 MG/ML PO SOLN
50.0000 mg | Freq: Every day | ORAL | Status: DC
  Administered 2017-01-23: 50 mg via ORAL
  Filled 2017-01-23 (×2): qty 5

## 2017-01-23 MED ORDER — ONDANSETRON HCL 4 MG/2ML IJ SOLN: 4.0000 mg | Freq: Once | INTRAMUSCULAR | Status: DC | PRN

## 2017-01-23 MED ORDER — GLYCOPYRROLATE 0.2 MG/ML IJ SOLN
INTRAMUSCULAR | Status: DC | PRN
  Administered 2017-01-23: .5 mg via INTRAVENOUS

## 2017-01-23 MED ORDER — FENTANYL CITRATE (PF) 100 MCG/2ML IJ SOLN
INTRAMUSCULAR | Status: DC | PRN
  Administered 2017-01-23 (×3): 50 ug via INTRAVENOUS

## 2017-01-23 MED ORDER — CEFAZOLIN SODIUM-DEXTROSE 2-4 GM/100ML-% IV SOLN
2.0000 g | Freq: Once | INTRAVENOUS | Status: AC
  Administered 2017-01-23: 2 g via INTRAVENOUS

## 2017-01-23 MED ORDER — HYDROCODONE-ACETAMINOPHEN 7.5-325 MG PO TABS
1.0000 | ORAL_TABLET | ORAL | Status: DC | PRN
  Administered 2017-01-24 – 2017-01-26 (×3): 1 via ORAL
  Filled 2017-01-23 (×4): qty 1

## 2017-01-23 MED ORDER — MIDAZOLAM HCL 5 MG/5ML IJ SOLN
INTRAMUSCULAR | Status: DC | PRN
  Administered 2017-01-23: 2 mg via INTRAVENOUS

## 2017-01-23 MED ORDER — HYDROMORPHONE HCL 1 MG/ML IJ SOLN: 0.2500 mg | INTRAMUSCULAR | Status: DC | PRN

## 2017-01-23 MED ORDER — PREDNISONE 10 MG PO TABS
10.0000 mg | ORAL_TABLET | Freq: Every day | ORAL | Status: DC
  Administered 2017-01-25 – 2017-01-27 (×2): 10 mg via ORAL
  Filled 2017-01-23 (×2): qty 1

## 2017-01-23 MED ORDER — DOLUTEGRAVIR SODIUM 50 MG PO TABS
50.0000 mg | ORAL_TABLET | Freq: Every day | ORAL | Status: DC
  Filled 2017-01-23: qty 1

## 2017-01-23 MED ORDER — PHENYLEPHRINE 40 MCG/ML (10ML) SYRINGE FOR IV PUSH (FOR BLOOD PRESSURE SUPPORT)
PREFILLED_SYRINGE | INTRAVENOUS | Status: AC
  Filled 2017-01-23: qty 10

## 2017-01-23 MED ORDER — ENOXAPARIN SODIUM 30 MG/0.3ML ~~LOC~~ SOLN
30.0000 mg | SUBCUTANEOUS | Status: DC
  Administered 2017-01-24: 30 mg via SUBCUTANEOUS
  Filled 2017-01-23: qty 0.3

## 2017-01-23 MED ORDER — NEOSTIGMINE METHYLSULFATE 10 MG/10ML IV SOLN
INTRAVENOUS | Status: DC | PRN
  Administered 2017-01-23: 4 mg via INTRAVENOUS

## 2017-01-23 MED ORDER — NEOSTIGMINE METHYLSULFATE 5 MG/5ML IV SOSY
PREFILLED_SYRINGE | INTRAVENOUS | Status: AC
  Filled 2017-01-23: qty 5

## 2017-01-23 MED ORDER — PHENYLEPHRINE HCL 10 MG/ML IJ SOLN
INTRAMUSCULAR | Status: DC | PRN
  Administered 2017-01-23: 30 ug/min via INTRAVENOUS

## 2017-01-23 MED ORDER — METOCLOPRAMIDE HCL 5 MG/ML IJ SOLN: 5.0000 mg | Freq: Three times a day (TID) | INTRAMUSCULAR | Status: DC | PRN

## 2017-01-23 MED ORDER — ROCURONIUM BROMIDE 10 MG/ML (PF) SYRINGE
PREFILLED_SYRINGE | INTRAVENOUS | Status: AC
  Filled 2017-01-23: qty 5

## 2017-01-23 MED ORDER — ACETAMINOPHEN 650 MG RE SUPP: 650.0000 mg | RECTAL | Status: DC | PRN

## 2017-01-23 MED ORDER — HYDROCODONE-ACETAMINOPHEN 7.5-325 MG PO TABS
2.0000 | ORAL_TABLET | ORAL | Status: DC | PRN
  Administered 2017-01-27: 2 via ORAL
  Filled 2017-01-23: qty 2

## 2017-01-23 MED ORDER — ENOXAPARIN SODIUM 30 MG/0.3ML ~~LOC~~ SOLN: 30.0000 mg | SUBCUTANEOUS | Status: DC

## 2017-01-23 MED ORDER — ABACAVIR SULFATE 300 MG PO TABS: 600.0000 mg | ORAL_TABLET | Freq: Every day | ORAL | Status: DC

## 2017-01-23 MED ORDER — PHENYLEPHRINE HCL 10 MG/ML IJ SOLN
INTRAMUSCULAR | Status: DC | PRN
  Administered 2017-01-23 (×2): 120 ug via INTRAVENOUS
  Administered 2017-01-23: 80 ug via INTRAVENOUS

## 2017-01-23 MED ORDER — METOCLOPRAMIDE HCL 5 MG PO TABS: 5.0000 mg | ORAL_TABLET | Freq: Three times a day (TID) | ORAL | Status: DC | PRN

## 2017-01-23 MED ORDER — PROPOFOL 10 MG/ML IV BOLUS
INTRAVENOUS | Status: AC
  Filled 2017-01-23: qty 20

## 2017-01-23 MED ORDER — CEFAZOLIN SODIUM-DEXTROSE 1-4 GM/50ML-% IV SOLN
1.0000 g | Freq: Four times a day (QID) | INTRAVENOUS | Status: AC
  Administered 2017-01-23 – 2017-01-24 (×3): 1 g via INTRAVENOUS
  Filled 2017-01-23 (×3): qty 50

## 2017-01-23 MED ORDER — ADULT MULTIVITAMIN W/MINERALS CH
1.0000 | ORAL_TABLET | Freq: Every day | ORAL | Status: DC
  Administered 2017-01-23 – 2017-01-27 (×3): 1 via ORAL
  Filled 2017-01-23 (×3): qty 1

## 2017-01-23 MED ORDER — CLINDAMYCIN PHOSPHATE 900 MG/50ML IV SOLN: 900.0000 mg | INTRAVENOUS | Status: DC

## 2017-01-23 MED ORDER — DEXAMETHASONE SODIUM PHOSPHATE 10 MG/ML IJ SOLN
INTRAMUSCULAR | Status: DC | PRN
  Administered 2017-01-23: 10 mg via INTRAVENOUS

## 2017-01-23 MED ORDER — DEXAMETHASONE SODIUM PHOSPHATE 10 MG/ML IJ SOLN
INTRAMUSCULAR | Status: AC
  Filled 2017-01-23: qty 1

## 2017-01-23 MED ORDER — LIDOCAINE 2% (20 MG/ML) 5 ML SYRINGE
INTRAMUSCULAR | Status: AC
  Filled 2017-01-23: qty 5

## 2017-01-23 MED ORDER — LIDOCAINE HCL (CARDIAC) 20 MG/ML IV SOLN
INTRAVENOUS | Status: DC | PRN
  Administered 2017-01-23: 100 mg via INTRAVENOUS

## 2017-01-23 MED ORDER — DOLUTEGRAVIR SODIUM 50 MG PO TABS
50.0000 mg | ORAL_TABLET | Freq: Every day | ORAL | Status: DC
  Administered 2017-01-23 – 2017-01-26 (×3): 50 mg via ORAL
  Filled 2017-01-23 (×5): qty 1

## 2017-01-23 MED ORDER — ONDANSETRON HCL 4 MG/2ML IJ SOLN
INTRAMUSCULAR | Status: AC
  Filled 2017-01-23: qty 2

## 2017-01-23 MED ORDER — ONDANSETRON HCL 4 MG PO TABS: 4.0000 mg | ORAL_TABLET | Freq: Four times a day (QID) | ORAL | Status: DC | PRN

## 2017-01-23 MED ORDER — ACETAMINOPHEN 325 MG PO TABS: 650.0000 mg | ORAL_TABLET | ORAL | Status: DC | PRN

## 2017-01-23 MED ORDER — MIDAZOLAM HCL 2 MG/2ML IJ SOLN
INTRAMUSCULAR | Status: AC
  Filled 2017-01-23: qty 2

## 2017-01-23 MED ORDER — ABACAVIR SULFATE 300 MG PO TABS
600.0000 mg | ORAL_TABLET | Freq: Every day | ORAL | Status: DC
  Administered 2017-01-23 – 2017-01-26 (×3): 600 mg via ORAL
  Filled 2017-01-23 (×5): qty 2

## 2017-01-23 MED ORDER — PROPOFOL 10 MG/ML IV BOLUS
INTRAVENOUS | Status: DC | PRN
  Administered 2017-01-23: 120 mg via INTRAVENOUS

## 2017-01-23 SURGICAL SUPPLY — 84 items
BANDAGE ACE 4X5 VEL STRL LF (GAUZE/BANDAGES/DRESSINGS) ×2 IMPLANT
BANDAGE ACE 6X5 VEL STRL LF (GAUZE/BANDAGES/DRESSINGS) ×2 IMPLANT
BANDAGE ESMARK 6X9 LF (GAUZE/BANDAGES/DRESSINGS) ×1 IMPLANT
BLADE CLIPPER SURG (BLADE) IMPLANT
BLADE SURG 10 STRL SS (BLADE) ×2 IMPLANT
BLADE SURG 15 STRL LF DISP TIS (BLADE) IMPLANT
BLADE SURG 15 STRL SS (BLADE)
BNDG COHESIVE 4X5 TAN STRL (GAUZE/BANDAGES/DRESSINGS) ×2 IMPLANT
BNDG ESMARK 6X9 LF (GAUZE/BANDAGES/DRESSINGS) ×2
BNDG GAUZE ELAST 4 BULKY (GAUZE/BANDAGES/DRESSINGS) ×2 IMPLANT
BONE CANC CHIPS 40CC CAN1/2 (Bone Implant) ×4 IMPLANT
BRUSH SCRUB SURG 4.25 DISP (MISCELLANEOUS) ×4 IMPLANT
CANISTER SUCT 3000ML PPV (MISCELLANEOUS) ×2 IMPLANT
CHIPS CANC BONE 40CC CAN1/2 (Bone Implant) ×2 IMPLANT
CLEANER TIP ELECTROSURG 2X2 (MISCELLANEOUS) ×2 IMPLANT
COVER SURGICAL LIGHT HANDLE (MISCELLANEOUS) ×2 IMPLANT
CUFF TOURNIQUET SINGLE 34IN LL (TOURNIQUET CUFF) IMPLANT
DRAPE C-ARM 42X72 X-RAY (DRAPES) ×2 IMPLANT
DRAPE C-ARMOR (DRAPES) ×2 IMPLANT
DRAPE HALF SHEET 40X57 (DRAPES) ×2 IMPLANT
DRAPE INCISE IOBAN 66X45 STRL (DRAPES) ×2 IMPLANT
DRAPE U-SHAPE 47X51 STRL (DRAPES) ×2 IMPLANT
DRILL BIT 2.5X100 214235007 (MISCELLANEOUS) ×2 IMPLANT
DRILL BIT 2.7X100 214235006 DU (MISCELLANEOUS) ×2 IMPLANT
DRSG ADAPTIC 3X8 NADH LF (GAUZE/BANDAGES/DRESSINGS) ×2 IMPLANT
DRSG PAD ABDOMINAL 8X10 ST (GAUZE/BANDAGES/DRESSINGS) IMPLANT
ELECT REM PT RETURN 9FT ADLT (ELECTROSURGICAL) ×2
ELECTRODE REM PT RTRN 9FT ADLT (ELECTROSURGICAL) ×1 IMPLANT
GAUZE SPONGE 4X4 12PLY STRL (GAUZE/BANDAGES/DRESSINGS) ×2 IMPLANT
GAUZE SPONGE 4X4 12PLY STRL LF (GAUZE/BANDAGES/DRESSINGS) ×2 IMPLANT
GLOVE BIO SURGEON STRL SZ7.5 (GLOVE) ×2 IMPLANT
GLOVE BIO SURGEON STRL SZ8 (GLOVE) ×2 IMPLANT
GLOVE BIOGEL PI IND STRL 7.5 (GLOVE) ×1 IMPLANT
GLOVE BIOGEL PI IND STRL 8 (GLOVE) ×1 IMPLANT
GLOVE BIOGEL PI INDICATOR 7.5 (GLOVE) ×1
GLOVE BIOGEL PI INDICATOR 8 (GLOVE) ×1
GOWN STRL REUS W/ TWL LRG LVL3 (GOWN DISPOSABLE) ×2 IMPLANT
GOWN STRL REUS W/ TWL XL LVL3 (GOWN DISPOSABLE) ×1 IMPLANT
GOWN STRL REUS W/TWL LRG LVL3 (GOWN DISPOSABLE) ×2
GOWN STRL REUS W/TWL XL LVL3 (GOWN DISPOSABLE) ×1
IMMOBILIZER KNEE 22 UNIV (SOFTGOODS) ×2 IMPLANT
K-WIRE ACE 1.6X6 (WIRE) ×4
KIT BASIN OR (CUSTOM PROCEDURE TRAY) ×2 IMPLANT
KIT ROOM TURNOVER OR (KITS) ×2 IMPLANT
KWIRE ACE 1.6X6 (WIRE) ×2 IMPLANT
NDL SUT 6 .5 CRC .975X.05 MAYO (NEEDLE) ×1 IMPLANT
NEEDLE MAYO TAPER (NEEDLE) ×1
NS IRRIG 1000ML POUR BTL (IV SOLUTION) ×2 IMPLANT
PACK ORTHO EXTREMITY (CUSTOM PROCEDURE TRAY) ×2 IMPLANT
PAD ABD 8X10 STRL (GAUZE/BANDAGES/DRESSINGS) ×2 IMPLANT
PAD ARMBOARD 7.5X6 YLW CONV (MISCELLANEOUS) ×6 IMPLANT
PAD CAST 4YDX4 CTTN HI CHSV (CAST SUPPLIES) IMPLANT
PADDING CAST COTTON 4X4 STRL (CAST SUPPLIES)
PADDING CAST COTTON 6X4 STRL (CAST SUPPLIES) IMPLANT
PLATE LOCK 5H STD RT PROX TIB (Plate) ×2 IMPLANT
SCREW CORTICAL 3.5MM  42MM (Screw) ×1 IMPLANT
SCREW CORTICAL 3.5MM  46MM (Screw) ×1 IMPLANT
SCREW CORTICAL 3.5MM 42MM (Screw) ×1 IMPLANT
SCREW CORTICAL 3.5MM 46MM (Screw) ×1 IMPLANT
SCREW CORTICAL 3.5MM 50MM (Screw) ×2 IMPLANT
SCREW LOCK CORT STAR 3.5X80 (Screw) ×4 IMPLANT
SCREW LOCK CORT STAR 3.5X85 (Screw) ×4 IMPLANT
SCREW LOCK CORT STAR 3.5X90 (Screw) ×4 IMPLANT
SCREW LP 3.5X75MM (Screw) ×4 IMPLANT
SPONGE LAP 18X18 X RAY DECT (DISPOSABLE) IMPLANT
STAPLER VISISTAT 35W (STAPLE) ×2 IMPLANT
STOCKINETTE IMPERVIOUS LG (DRAPES) ×2 IMPLANT
SUCTION FRAZIER HANDLE 10FR (MISCELLANEOUS) ×1
SUCTION TUBE FRAZIER 10FR DISP (MISCELLANEOUS) ×1 IMPLANT
SUT ETHILON 2 0 PSLX (SUTURE) ×2 IMPLANT
SUT ETHILON 3 0 PS 1 (SUTURE) IMPLANT
SUT PROLENE 0 CT 2 (SUTURE) ×4 IMPLANT
SUT VIC AB 0 CT1 27 (SUTURE)
SUT VIC AB 0 CT1 27XBRD ANBCTR (SUTURE) IMPLANT
SUT VIC AB 1 CT1 27 (SUTURE) ×1
SUT VIC AB 1 CT1 27XBRD ANBCTR (SUTURE) ×1 IMPLANT
SUT VIC AB 2-0 CT1 27 (SUTURE) ×1
SUT VIC AB 2-0 CT1 TAPERPNT 27 (SUTURE) ×1 IMPLANT
TOWEL OR 17X24 6PK STRL BLUE (TOWEL DISPOSABLE) ×2 IMPLANT
TOWEL OR 17X26 10 PK STRL BLUE (TOWEL DISPOSABLE) ×4 IMPLANT
TRAY FOLEY W/METER SILVER 16FR (SET/KITS/TRAYS/PACK) IMPLANT
TUBE CONNECTING 12X1/4 (SUCTIONS) ×2 IMPLANT
WATER STERILE IRR 1000ML POUR (IV SOLUTION) IMPLANT
YANKAUER SUCT BULB TIP NO VENT (SUCTIONS) ×2 IMPLANT

## 2017-01-23 NOTE — Anesthesia Preprocedure Evaluation (Signed)
Anesthesia Evaluation  Patient identified by MRN, date of birth, ID band Patient awake    Reviewed: Allergy & Precautions, NPO status , Patient's Chart, lab work & pertinent test results  Airway Mallampati: I  TM Distance: >3 FB Neck ROM: Full    Dental   Pulmonary    Pulmonary exam normal        Cardiovascular hypertension, Pt. on medications Normal cardiovascular exam     Neuro/Psych    GI/Hepatic GERD  Medicated and Controlled,  Endo/Other    Renal/GU ESRF and DialysisRenal disease     Musculoskeletal   Abdominal   Peds  Hematology  (+) HIV,   Anesthesia Other Findings   Reproductive/Obstetrics                             Anesthesia Physical Anesthesia Plan  ASA: III  Anesthesia Plan: General   Post-op Pain Management:    Induction: Intravenous  PONV Risk Score and Plan: 2 and Ondansetron, Dexamethasone and Treatment may vary due to age or medical condition  Airway Management Planned: Oral ETT  Additional Equipment:   Intra-op Plan:   Post-operative Plan: Extubation in OR  Informed Consent: I have reviewed the patients History and Physical, chart, labs and discussed the procedure including the risks, benefits and alternatives for the proposed anesthesia with the patient or authorized representative who has indicated his/her understanding and acceptance.     Plan Discussed with: CRNA and Surgeon  Anesthesia Plan Comments:         Anesthesia Quick Evaluation

## 2017-01-23 NOTE — Progress Notes (Signed)
[facility] Rounding Note Subjective/Interval History:  Just back from surgery Pain controlled No SOB   Objective Vital signs in last 24 hours: Vitals:   01/23/17 1415 01/23/17 1443 01/23/17 1453 01/23/17 1517  BP: 123/82 133/84 140/87 (!) 149/96  Pulse: 99 100 98 (!) 104  Resp: 17 14 15    Temp: 98.6 F (37 C)  99.3 F (37.4 C) 98.7 F (37.1 C)  TempSrc:    Oral  SpO2: 100% 99% 98% 100%  Weight:      Height:       Weight change:   Intake/Output Summary (Last 24 hours) at 01/23/17 1626 Last data filed at 01/23/17 1453  Gross per 24 hour  Intake              520 ml  Output             2150 ml  Net            -1630 ml   Physical Exam:  Blood pressure (!) 149/96, pulse (!) 104, temperature 98.7 F (37.1 C), temperature source Oral, resp. rate 15, height 6\' 1"  (1.854 m), weight 65.8 kg (145 lb), SpO2 100 %.   General: WDWN, NAD, thin BM - seen post op Lungs: CTA bilaterally.  No wheeze, rales or rhonchi.  Regular Normal S1S2 No S3S4 . No murmur, rubs or gallops.  Abdomen soft, nontender, +BS,  Lower extremities: No edema, or ischemic changes, +pedal pulses, RLE in knee brace, remainder of RLE in ACE wrap.  RUE in sling  A&Ox3.  Psych:  Responds to questions appropriately with a normal affect. Dialysis Access: LU AVF +bruit  Recent Labs Lab 01/21/17 2248 01/22/17 1225 01/23/17 1006  NA 137 139 138  K 4.9 4.9 4.9  CL 101 101  --   CO2 25 27  --   GLUCOSE 142* 106* 80  BUN 26* 32*  --   CREATININE 10.30* 11.47*  --   CALCIUM 8.8* 8.4*  --   PHOS  --  2.4*  --     Recent Labs Lab 01/21/17 2248 01/22/17 1225  AST 28  --   ALT 17  --   ALKPHOS 108  --   BILITOT 0.6  --   PROT 8.1  --   ALBUMIN 3.3* 3.0*    Recent Labs Lab 01/21/17 2248 01/22/17 1225 01/22/17 1949 01/23/17 0655 01/23/17 1006  WBC 10.7* 9.9 11.2* 13.9*  --   NEUTROABS 8.3*  --   --   --   --   HGB 14.2 14.5 13.4 13.0 13.3  HCT 42.4 44.7 40.9 39.3 39.0  MCV 100.5* 100.2* 100.7* 100.8*   --   PLT 168 123* 133* 164  --     Studies/Results: Dg Clavicle Right  Result Date: 01/22/2017 CLINICAL DATA:  Right shoulder pain after fall. EXAM: RIGHT CLAVICLE - 2+ VIEWS COMPARISON:  Same day radiographs of the shoulder. FINDINGS: An acute, closed, comminuted intra-articular fracture of the distal clavicle is noted. Slight caudal angulation of the distal fracture fragments. Fracture lucencies are also noted of the included base of the coracoid, scapula above the level of the scapular spine and possibly the glenoid. No shoulder dislocations. The included humeral head and neck are intact. IMPRESSION: 1. Acute, comminuted, intra-articular fracture of the distal clavicle extending into the acromioclavicular joint. Slight caudal angulation of the distal fracture fragments. 2. Additional irregular fracture lucencies at the base of the coracoid and supraspinous portion of the scapula are noted on these  images. 3. A fracture of the glenoid fossa is also seen. Electronically Signed   By: Ashley Royalty M.D.   On: 01/22/2017 03:43   Dg Shoulder Right  Result Date: 01/21/2017 CLINICAL DATA:  61 y/o  M; fall with pain and swelling. EXAM: RIGHT SHOULDER - 2+ VIEW COMPARISON:  None. FINDINGS: Irregularity of distal clavicle at the acromioclavicular joint with transverse lucency may representing acute fracture. No shoulder dislocation. No proximal humerus fracture. IMPRESSION: Irregularity of distal clavicle at acromioclavicular joint with lucencies may represent acute fracture. Correlate for focal tenderness and consider clavicle radiographs. Electronically Signed   By: Kristine Garbe M.D.   On: 01/21/2017 23:44   Dg Tibia/fibula Right  Result Date: 01/23/2017 CLINICAL DATA:  Fracture fixation EXAM: DG C-ARM 61-120 MIN; RIGHT TIBIA AND FIBULA - 2 VIEW COMPARISON:  01/2020 and 01/22/2017 FINDINGS: Depressed fracture of the lateral tibial plateau has been reduced now with normal joint space. Lateral  plate and screws in good position. IMPRESSION: ORIF lateral tibial plateau fracture with normal joint space on postoperative images. Electronically Signed   By: Franchot Gallo M.D.   On: 01/23/2017 14:00   Dg Tibia/fibula Right  Result Date: 01/21/2017 CLINICAL DATA:  Status post fall, with right lower leg pain. Initial encounter. EXAM: RIGHT TIBIA AND FIBULA - 2 VIEW COMPARISON:  None. FINDINGS: There is a comminuted fracture involving the lateral tibial plateau, with perhaps 8 mm of depression. A small knee joint effusion is noted. No additional fractures are seen. The ankle mortise is grossly unremarkable in appearance. The soft tissues are otherwise grossly unremarkable in appearance. IMPRESSION: Comminuted fracture involving the lateral tibial plateau, with perhaps 8 mm of depression. Small knee joint effusion noted. Electronically Signed   By: Garald Balding M.D.   On: 01/21/2017 23:50   Ct Head Wo Contrast  Result Date: 01/22/2017 CLINICAL DATA:  Status post fall forward, with right shoulder pain, and concern for head or cervical spine injury. Initial encounter. EXAM: CT HEAD WITHOUT CONTRAST CT CERVICAL SPINE WITHOUT CONTRAST TECHNIQUE: Multidetector CT imaging of the head and cervical spine was performed following the standard protocol without intravenous contrast. Multiplanar CT image reconstructions of the cervical spine were also generated. COMPARISON:  None. FINDINGS: CT HEAD FINDINGS Brain: No evidence of acute infarction, hemorrhage, hydrocephalus, extra-axial collection or mass lesion/mass effect. The posterior fossa, including the cerebellum, brainstem and fourth ventricle, is within normal limits. The third and lateral ventricles, and basal ganglia are unremarkable in appearance. The cerebral hemispheres are symmetric in appearance, with normal gray-white differentiation. No mass effect or midline shift is seen. Vascular: No hyperdense vessel or unexpected calcification. Skull: There is  no evidence of fracture; visualized osseous structures are unremarkable in appearance. Sinuses/Orbits: The orbits are within normal limits. The paranasal sinuses and mastoid air cells are well-aerated. Other: Cerumen is noted filling the right external auditory canal. CT CERVICAL SPINE FINDINGS Alignment: Normal. Skull base and vertebrae: No acute fracture. No primary bone lesion or focal pathologic process. Soft tissues and spinal canal: No prevertebral fluid or swelling. No visible canal hematoma. Disc levels: Minimal disc space narrowing is noted along the lower cervical spine, with small anterior and posterior disc osteophyte complexes. Mild underlying facet disease is noted along the cervical spine. Upper chest: The visualized lung apices are clear. The thyroid gland is unremarkable. Other: No additional soft tissue abnormalities are seen. IMPRESSION: 1. No evidence of traumatic intracranial injury or fracture. 2. No evidence of fracture or subluxation along the cervical  spine. 3. Cerumen noted filling the right external auditory canal. Electronically Signed   By: Garald Balding M.D.   On: 01/22/2017 06:39   Ct Cervical Spine Wo Contrast  Result Date: 01/22/2017 CLINICAL DATA:  Status post fall forward, with right shoulder pain, and concern for head or cervical spine injury. Initial encounter. EXAM: CT HEAD WITHOUT CONTRAST CT CERVICAL SPINE WITHOUT CONTRAST TECHNIQUE: Multidetector CT imaging of the head and cervical spine was performed following the standard protocol without intravenous contrast. Multiplanar CT image reconstructions of the cervical spine were also generated. COMPARISON:  None. FINDINGS: CT HEAD FINDINGS Brain: No evidence of acute infarction, hemorrhage, hydrocephalus, extra-axial collection or mass lesion/mass effect. The posterior fossa, including the cerebellum, brainstem and fourth ventricle, is within normal limits. The third and lateral ventricles, and basal ganglia are  unremarkable in appearance. The cerebral hemispheres are symmetric in appearance, with normal gray-white differentiation. No mass effect or midline shift is seen. Vascular: No hyperdense vessel or unexpected calcification. Skull: There is no evidence of fracture; visualized osseous structures are unremarkable in appearance. Sinuses/Orbits: The orbits are within normal limits. The paranasal sinuses and mastoid air cells are well-aerated. Other: Cerumen is noted filling the right external auditory canal. CT CERVICAL SPINE FINDINGS Alignment: Normal. Skull base and vertebrae: No acute fracture. No primary bone lesion or focal pathologic process. Soft tissues and spinal canal: No prevertebral fluid or swelling. No visible canal hematoma. Disc levels: Minimal disc space narrowing is noted along the lower cervical spine, with small anterior and posterior disc osteophyte complexes. Mild underlying facet disease is noted along the cervical spine. Upper chest: The visualized lung apices are clear. The thyroid gland is unremarkable. Other: No additional soft tissue abnormalities are seen. IMPRESSION: 1. No evidence of traumatic intracranial injury or fracture. 2. No evidence of fracture or subluxation along the cervical spine. 3. Cerumen noted filling the right external auditory canal. Electronically Signed   By: Garald Balding M.D.   On: 01/22/2017 06:39   Ct Knee Right Wo Contrast  Result Date: 01/22/2017 CLINICAL DATA:  Status post fall while walking up steps, with right knee pain and swelling. Initial encounter. EXAM: CT OF THE RIGHT KNEE WITHOUT CONTRAST TECHNIQUE: Multidetector CT imaging of the right knee was performed according to the standard protocol. Multiplanar CT image reconstructions were also generated. COMPARISON:  Right knee radiographs performed 01/21/2017 FINDINGS: Bones/Joint/Cartilage There is a comminuted fracture involving the lateral tibial plateau, extending into the metadiaphysis, with up to 9  mm of depression. Multiple displaced fracture fragments are noted. There is mild fragmentation involving the lateral aspect of the tibial spine. There is also a small fracture at the lateral aspect of the fibular head. A large lipohemarthrosis is noted. Ligaments Suboptimally assessed by CT. Diffuse edema is noted about the lateral collateral ligament complex. Muscles and Tendons The visualized musculature is grossly unremarkable. Diffuse thickening of the distal quadriceps tendon may reflect some degree of injury. Soft tissues The vasculature is not well assessed without contrast. Soft tissue injury is noted about the knee. IMPRESSION: 1. Comminuted fracture involving the lateral tibial plateau, extending into the metadiaphysis, with up to 9 mm of depression. Mild fragmentation involves the lateral aspect of the tibial spine. 2. Small fracture at the lateral aspect of the fibular head. 3. Large lipohemarthrosis noted. 4. Diffuse thickening of the distal quadriceps tendon may reflect some degree of injury. 5. Diffuse edema about the lateral collateral ligament complex. Ligaments not well assessed on CT. 6. Soft tissue  injury about the knee. Electronically Signed   By: Garald Balding M.D.   On: 01/22/2017 04:44   Ct Shoulder Right Wo Contrast  Result Date: 01/22/2017 CLINICAL DATA:  Status post fall forward, with right shoulder pain and swelling. Initial encounter. EXAM: CT OF THE UPPER RIGHT EXTREMITY WITHOUT CONTRAST TECHNIQUE: Multidetector CT imaging of the upper right extremity was performed according to the standard protocol. COMPARISON:  Right shoulder radiographs performed 01/21/2017 FINDINGS: Bones/Joint/Cartilage There is a mildly displaced fracture involving the distal aspect of the right clavicle, extending into the right acromioclavicular joint. There is also a mildly comminuted fracture involving the superior aspect of the right scapula, extending along the edge of the glenoid. No definite  glenohumeral joint effusion is seen. The cartilage is not well assessed on CT. Ligaments Suboptimally assessed by CT. Muscles and Tendons Mild diffuse soft tissue injury is noted along the right axilla. No definite focal abnormalities are seen with regard to the musculature. Soft tissues No new focal abnormalities are identified. IMPRESSION: 1. Mildly displaced fracture involving the distal aspect of the right clavicle, extending into the right acromioclavicular joint. 2. Mildly comminuted fracture involving the superior aspect of the right scapula, extending along the edge of the glenoid. 3. Mild diffuse soft tissue injury along the right axilla. Electronically Signed   By: Garald Balding M.D.   On: 01/22/2017 06:47   Dg Chest Port 1 View  Result Date: 01/22/2017 CLINICAL DATA:  61 year old male under preoperative evaluation prior to ORIF procedure tomorrow. EXAM: PORTABLE CHEST 1 VIEW COMPARISON:  Chest x-ray 02/23/2015. FINDINGS: Lung volumes are normal. No consolidative airspace disease. Minimal scarring or subsegmental atelectasis in the periphery of the left lung base. No pleural effusions. No pneumothorax. No pulmonary nodule or mass noted. Pulmonary vasculature and the cardiomediastinal silhouette are within normal limits. Atherosclerosis in the thoracic aorta. Surgical clips Barnabas Lister over the epigastric region. IMPRESSION: 1. No radiographic evidence of acute cardiopulmonary disease. 2. Aortic atherosclerosis. Electronically Signed   By: Vinnie Langton M.D.   On: 01/22/2017 19:00   Dg Knee Right Port  Result Date: 01/23/2017 CLINICAL DATA:  ORIF right tibia . EXAM: PORTABLE RIGHT KNEE - 1-2 VIEW COMPARISON:  01/23/2017. FINDINGS: ORIF proximal tibia. Hardware intact. Anatomic alignment. Postsurgical changes noted in the soft tissues. IMPRESSION: ORIF proximal right tibia.  Anatomic alignment. Electronically Signed   By: Marcello Moores  Register   On: 01/23/2017 15:06   Dg C-arm 61-120 Min  Result Date:  01/23/2017 CLINICAL DATA:  Fracture fixation EXAM: DG C-ARM 61-120 MIN; RIGHT TIBIA AND FIBULA - 2 VIEW COMPARISON:  01/2020 and 01/22/2017 FINDINGS: Depressed fracture of the lateral tibial plateau has been reduced now with normal joint space. Lateral plate and screws in good position. IMPRESSION: ORIF lateral tibial plateau fracture with normal joint space on postoperative images. Electronically Signed   By: Franchot Gallo M.D.   On: 01/23/2017 14:00   Medications: . sodium chloride    . sodium chloride    . sodium chloride 10 mL/hr at 01/23/17 1619  . 0.9 % NaCl with KCl 20 mEq / L    .  ceFAZolin (ANCEF) IV 1 g (01/23/17 1618)   . [START ON 01/24/2017] abacavir  600 mg Oral Daily  . amitriptyline  10 mg Oral QHS  . calcium acetate  1,334 mg Oral TID WC  . [START ON 01/24/2017] dolutegravir  50 mg Oral Daily  . enoxaparin (LOVENOX) injection  30 mg Subcutaneous Q24H  . hydroxychloroquine  200 mg  Oral BID  . [START ON 01/24/2017] lamiVUDine  50 mg Oral Daily  . multivitamin with minerals  1 tablet Oral Daily  . [START ON 01/24/2017] predniSONE  10 mg Oral Daily   Dialysis Orders:  MWF - [place]. 4hrs,  BFR 400,  DFR 800,   EDW 65kg,  2K/ 2Ca,  Access: LU AVF  Heparin 5000 Unit bolus IV Venofer 100 mg IV x4HD - 1/4 completed Calcitriol 0.9mcg PO x3wk  Last Labs PTA: 10/17 Hgb 14.5, 10/10: TSAT 23%, K 4.9, Ca 9.2, P 4.1, PTH 574, Alb 3.7  Assessment/Plan: 1. Multiple fractures following mechanical fall (R clavicle, R scapula, R tibia plateau)  - s/p ORIF, arthrotomy/meniscus repair and anterior compartment fasciotomy today 10/23 by Dr. Marcelino Scot. Other fractures non surgical management. Will need rehab. NWB for 8 weeks. Will need to be able to do transfers for HD 2.  ESRD -  MWF schedule. HD 10/24 3.  Hypertension/volume  - BP elevated, does not appear clinically volume overloaded. Will titrate down volume as tolerated.  4.  Anemia  -  Hb ranging 13-14  since adm.  Anticipate drop post surgery.  5.  Secondary Hyperparathyroidism -  Ca in goal. Outpatient P in goal. PTH elevated. Continue calcitriol and phoslo. 6.  Nutrition - Alb 3.3. Renal/carb modified diet. NPO after midnight for surgery tomorrow. Renavite 7. HIV - well controlled. Home meds. 8. Lupus - on prednisone 10 mg/day.   Jamal Maes, MD [facility] [phone] pager 01/23/2017, 4:26 PM

## 2017-01-23 NOTE — Anesthesia Postprocedure Evaluation (Signed)
Anesthesia Post Note  Patient: BRYSE BLANCHETTE  Procedure(s) Performed: OPEN REDUCTION INTERNAL FIXATION (ORIF) TIBIAL PLATEAU; REPAIR OF LATERAL TIBIAL PLATEAU; ARTHROTOMY WITH MENISCUS REPAIR; ANTERIOR COMPARTMENT FASCIOTOMY (Right Leg Lower)     Patient location during evaluation: PACU Anesthesia Type: General Level of consciousness: awake and alert Pain management: pain level controlled Vital Signs Assessment: post-procedure vital signs reviewed and stable Respiratory status: spontaneous breathing, nonlabored ventilation, respiratory function stable and patient connected to nasal cannula oxygen Cardiovascular status: blood pressure returned to baseline and stable Postop Assessment: no apparent nausea or vomiting Anesthetic complications: no    Last Vitals:  Vitals:   01/23/17 1453 01/23/17 1517  BP: 140/87 (!) 149/96  Pulse: 98 (!) 104  Resp: 15   Temp: 37.4 C 37.1 C  SpO2: 98% 100%    Last Pain:  Vitals:   01/23/17 1517  TempSrc: Oral  PainSc:                  Damarri Rampy DAVID

## 2017-01-23 NOTE — Transfer of Care (Signed)
Immediate Anesthesia Transfer of Care Note  Patient: Kenneth [name]  Procedure(s) Performed: OPEN REDUCTION INTERNAL FIXATION (ORIF) TIBIAL PLATEAU; REPAIR OF LATERAL TIBIAL PLATEAU; ARTHROTOMY WITH MENISCUS REPAIR; ANTERIOR COMPARTMENT FASCIOTOMY (Right Leg Lower)  Patient Location: PACU  Anesthesia Type:General  Level of Consciousness: awake, oriented and patient cooperative  Airway & Oxygen Therapy: Patient Spontanous Breathing and Patient connected to face mask oxygen  Post-op Assessment: Report given to RN and Post -op Vital signs reviewed and stable  Post vital signs: Reviewed  Last Vitals:  Vitals:   01/23/17 0659 01/23/17 1415  BP: (!) 143/83 123/82  Pulse: 97 99  Resp: 16 17  Temp: (!) 38.1 C 37 C  SpO2: 100% 100%    Last Pain:  Vitals:   01/23/17 0659  TempSrc: Oral  PainSc:       Patients Stated Pain Goal: 3 (51/83/43 7357)  Complications: No apparent anesthesia complications

## 2017-01-23 NOTE — Op Note (Signed)
01/22/2017 - 01/23/2017  3:05 PM  PATIENT:  Kenneth [name]  61 y.o. male  PRE-OPERATIVE DIAGNOSIS:  1. RIGHT LATERAL TIBIAL PLATEAU FRACTURE 2. RIGHT LATERAL MENISCUS TEAR AT CAPSULAR INSERTION 3. OSTEOPOROSIS  POST-OPERATIVE DIAGNOSIS:   1. RIGHT LATERAL TIBIAL PLATEAU FRACTURE 2. RIGHT LATERAL MENISCUS TEAR AT CAPSULAR INSERTION 3. OSTEOPOROSIS   PROCEDURE:  Procedure(s): 1. OPEN REDUCTION INTERNAL FIXATION (ORIF) LATERAL TIBIAL PLATEAU  2. ARTHROTOMY WITH MENISCUS REPAIR 3. ANTERIOR COMPARTMENT FASCIOTOMY (Right)  SURGEON:  Surgeon(s) and Role:    Altamese [place], MD - Primary  PHYSICIAN ASSISTANT: Ainsley Spinner, PA-C  ANESTHESIA:   general  I/O:  Total I/O In: 520 [I.V.:520] Out: 150 [Blood:150]  SPECIMEN:  No Specimen  TOURNIQUET:   Total Tourniquet Time Documented: Thigh (Right) - 105 minutes Total: Thigh (Right) - 105 minutes   DICTATION: .Note written in EPIC  DISPOSITION: to PACU  CONDITION: Stable  3:09 PM 01/23/2017 01/22/2017 - 01/23/2017  PATIENT:  Kenneth [name]  61 y.o. male  [id number]  PRE-OPERATIVE DIAGNOSIS:  right tibia plateau fracture  POST-OPERATIVE DIAGNOSIS:  right tibia plateau fracture  PROCEDURE:  Procedure(s): OPEN REDUCTION INTERNAL FIXATION (ORIF) TIBIAL PLATEAU; REPAIR OF LATERAL TIBIAL PLATEAU; ARTHROTOMY WITH MENISCUS REPAIR; ANTERIOR COMPARTMENT FASCIOTOMY (Right)  SURGEON:  Surgeon(s) and Role:    Altamese [place], MD - Primary  PHYSICIAN ASSISTANT: Ainsley Spinner, PA-C  ANESTHESIA:   general  I/O:  Total I/O In: 520 [I.V.:520] Out: 150 [Blood:150]  SPECIMEN:  No Specimen  TOURNIQUET:   Total Tourniquet Time Documented: Thigh (Right) - 105 minutes Total: Thigh (Right) - 105 minutes   DICTATION: .Note written in EPIC    BRIEF SUMMARY AND INDICATION FOR PROCEDURE:  Patient is a 61 y.o.-year- old with a tibial plateau fracture, treated provisionally with aggressive ice, elevation, and active motion of  the foot and toes to facilitate resolution of soft tissue swelling. He has significant risks factors that increase the complexity of his care and risk of complications, specifically prolonged steroid use, ESRD with dialysis, HIV, and lupus, in addition to his current new fractures of the right distal clavicle and scapula. We did discuss with the patient the risks and benefits of surgical treatment including the potential for infection, arthritis, nerve injury, vessel injury, loss of motion, DVT, PE, heart attack, stroke, symptomatic hardware, need for further surgery, and multiple others.  The patient acknowledged these risks and wished to proceed.  BRIEF SUMMARY OF PROCEDURE:  After administration of preoperative Ancef, the patient was taken to the operating room.  General anesthesia was induced and the lower extremity prepped and draped in usual sterile fashion using a chlorhexidine wash and betadine scrub and paint. A timeout was performed. I then brought in the radiolucent triangle.  A curvilinear incision was made extending laterally over Gerdy's tubercle. Dissection was carried down where the soft tissues were left intact to the lateral plateau and rim.  I did incise the retinaculum proximal to the tibial plateau, and then going along inside the retinaculum performed a submeniscal arthrotomy , releasing the coronary ligament along its insertion onto the tibia. Zero prolene suture was used to reflect this and inspect the meniscus and joint surface. The joint was irrigated thoroughly and this revealed the lateral meniscus tear at the juncture with the capsule from anterior horn through midbody. I repaired this back with four independent vertical mattress sutures using zero prolene. The joint surface was markedly depressed.  We then released some of the anterior extensors to  enable the plate to fit along the proximal shaft. The fracture site in the lateral tibial metaphysis was used to access  the depressed subchondral segments.  Through this, I introduced a series of tamps and with my assistant pulling traction, I was able to elevate the articular surface in sequential fashion while watching it through the arthrotomy.  Once I had restored appropriate height, the bone defect was grafted with cancellous chips.  I then placed the plate laterally and used the [employer] clamp carefully because of compromised bone strength to apply a compressive force across the joint line. This reduced the widened plateau back to the appropriate size.  At this point, we placed standard fixation in the proximal row of the plate followed by locked fixation.  The defect in the metaphysis was filled with cancellous bone and tamped into place. This was followed by additional fixation within the shaft. My assistant was careful to control alignment throughout by using traction and bending forces. He also assited with retraction. All wounds were irrigated thoroughly.  Prior to closure, I turned my attention to the distal edge of the wound here underneath the skin.  I used the long scissors to spread both superficial and deep to the anterior compartment.  The fascia was then released for 8 to 10 cm to reduce the likelihood of the postoperative compartment syndrome.  Once more, wound was irrigated and then a standard layered closure performed, 0 Vicryl, 2-0 Vicryl, and 3-0 nylon for the skin.  Sterile gently compressive dressing was applied and in knee immobilizer.  The patient was taken to the PACU in stable condition.  PROGNOSIS: The patient will be transitioned into a hinged knee brace with unrestricted range of motion and this will begin immediately.  He will be nonweightbearing on the operative extremity with WBAT through the RUEx, be on pharmacologic DVT prophylaxis, and mobilized with PT and OT. After discharge, we will plan to see him back in about 2 weeks for removal of sutures and we will continue  to follow throughout the hospital stay.     Astrid Divine. Marcelino Scot, M.D.

## 2017-01-23 NOTE — Anesthesia Procedure Notes (Signed)
Procedure Name: Intubation Date/Time: 01/23/2017 11:26 AM Performed by: Jenne Campus Pre-anesthesia Checklist: Patient identified, Emergency Drugs available, Suction available and Patient being monitored Patient Re-evaluated:Patient Re-evaluated prior to induction Oxygen Delivery Method: Circle System Utilized Preoxygenation: Pre-oxygenation with 100% oxygen Induction Type: IV induction Ventilation: Mask ventilation without difficulty Laryngoscope Size: Miller and 3 Grade View: Grade I Tube type: Oral Tube size: 7.5 mm Number of attempts: 1 Airway Equipment and Method: Stylet and Oral airway Placement Confirmation: ETT inserted through vocal cords under direct vision,  positive ETCO2 and breath sounds checked- equal and bilateral Secured at: 24 cm Tube secured with: Tape Dental Injury: Teeth and Oropharynx as per pre-operative assessment

## 2017-01-23 NOTE — Progress Notes (Signed)
Patient returned to unit from PACU. Alert and oriented x4. Denies pain at this time. ACE wrap with immobolizer noted to right leg. Right arm in sling. No s/s of distress noted. Instructed to use call light for assist. Call light in reach.

## 2017-01-24 ENCOUNTER — Encounter (HOSPITAL_COMMUNITY): Payer: Self-pay | Admitting: Orthopedic Surgery

## 2017-01-24 LAB — TSH: TSH: 0.581 u[IU]/mL (ref 0.350–4.500)

## 2017-01-24 LAB — RENAL FUNCTION PANEL
Albumin: 2.6 g/dL — ABNORMAL LOW (ref 3.5–5.0)
Anion gap: 17 — ABNORMAL HIGH (ref 5–15)
BUN: 49 mg/dL — AB (ref 6–20)
CHLORIDE: 94 mmol/L — AB (ref 101–111)
CO2: 24 mmol/L (ref 22–32)
CREATININE: 9.91 mg/dL — AB (ref 0.61–1.24)
Calcium: 7.9 mg/dL — ABNORMAL LOW (ref 8.9–10.3)
GFR calc Af Amer: 6 mL/min — ABNORMAL LOW (ref >60)
GFR calc non Af Amer: 5 mL/min — ABNORMAL LOW (ref >60)
GLUCOSE: 105 mg/dL — AB (ref 65–99)
POTASSIUM: 5.9 mmol/L — AB (ref 3.5–5.1)
Phosphorus: 4.3 mg/dL (ref 2.5–4.6)
Sodium: 135 mmol/L (ref 135–145)

## 2017-01-24 LAB — PREALBUMIN: PREALBUMIN: 22.9 mg/dL (ref 18–38)

## 2017-01-24 LAB — CBC
HEMATOCRIT: 35.2 % — AB (ref 39.0–52.0)
Hemoglobin: 11.3 g/dL — ABNORMAL LOW (ref 13.0–17.0)
MCH: 31.8 pg (ref 26.0–34.0)
MCHC: 32.1 g/dL (ref 30.0–36.0)
MCV: 99.2 fL (ref 78.0–100.0)
PLATELETS: 155 10*3/uL (ref 150–400)
RBC: 3.55 MIL/uL — ABNORMAL LOW (ref 4.22–5.81)
RDW: 14.9 % (ref 11.5–15.5)
WBC: 17.8 10*3/uL — AB (ref 4.0–10.5)

## 2017-01-24 LAB — MAGNESIUM: MAGNESIUM: 2.1 mg/dL (ref 1.7–2.4)

## 2017-01-24 MED ORDER — NEPRO/CARBSTEADY PO LIQD
237.0000 mL | Freq: Three times a day (TID) | ORAL | Status: DC
  Administered 2017-01-25 – 2017-01-27 (×5): 237 mL via ORAL
  Filled 2017-01-24 (×12): qty 237

## 2017-01-24 MED ORDER — DOCUSATE SODIUM 100 MG PO CAPS
100.0000 mg | ORAL_CAPSULE | Freq: Every day | ORAL | Status: DC | PRN
  Administered 2017-01-25: 100 mg via ORAL
  Filled 2017-01-24: qty 1

## 2017-01-24 MED ORDER — LAMIVUDINE 10 MG/ML PO SOLN
25.0000 mg | Freq: Every day | ORAL | Status: DC
  Administered 2017-01-25 – 2017-01-26 (×2): 25 mg via ORAL
  Filled 2017-01-24 (×4): qty 5

## 2017-01-24 MED ORDER — HEPARIN SODIUM (PORCINE) 5000 UNIT/ML IJ SOLN
5000.0000 [IU] | Freq: Three times a day (TID) | INTRAMUSCULAR | Status: DC
  Administered 2017-01-24 – 2017-01-27 (×8): 5000 [IU] via SUBCUTANEOUS
  Filled 2017-01-24 (×8): qty 1

## 2017-01-24 MED ORDER — POLYETHYLENE GLYCOL 3350 17 G PO PACK
17.0000 g | PACK | Freq: Every day | ORAL | Status: DC
  Administered 2017-01-25: 17 g via ORAL
  Filled 2017-01-24: qty 1

## 2017-01-24 NOTE — Social Work (Signed)
CSW went to meet with patient to discuss SNF options as a backup to CIR however patient at dialysis. CSW will continue to follow to assist as needed with disposition.  Elissa Hefty, LCSW Clinical Social Worker [phone]

## 2017-01-24 NOTE — Progress Notes (Signed)
[facility] Progress Note   Subjective:  No new c/o. s    Objective Vitals:   01/23/17 1517 01/23/17 2019 01/24/17 0035 01/24/17 0503  BP: (!) 149/96 137/79 (!) 144/79 135/79  Pulse: (!) 104 90 92 85  Resp:  20 18 17   Temp: 98.7 F (37.1 C) 98.6 F (37 C) 98.1 F (36.7 C) 98.5 F (36.9 C)  TempSrc: Oral Oral Oral Oral  SpO2: 100% 99% 95% 97%  Weight:      Height:       Physical Exam General:NAD, thin BM Heart:tachycardia, regular rhythm Lungs:CTAB Abdomen:soft, NT Extremities: LE: no edema, RLE in brace Dialysis Access: LU AVF +thrill/bruit    Dialysis Orders: MWF MWF - [place]. 4hrs,  BFR 400,  DFR 800,  EDW 65kg,  2K/ 2Ca,  Access: LU AVF Heparin 5000 Unit bolus IV Venofer 100 mg IV x4HD - 1/4 completed Calcitriol 0.56mcg PO x3wk  Last Labs PTA: 10/17Hgb 14.5, 10/10: TSAT 23%, K 4.9, Ca 9.2, P 4.1, PTH 574, Alb 3.7  Assessment/Plan: 1. Multiple fractures following mechanical fall (R clavicle, R scapula, R tibia plateau)  - s/p ORIF, arthrotomy/meniscus repair and anterior compartment fasciotomy on 10/23 by Dr. Marcelino Scot. Other fractures non surgical management. Will need rehab. Will need to be able to do transfers for HD. Per Ortho PT/OT WBAT RUE and NWB RLE x 8wks.  2. ESRD - MWF schedule. HD scheduled for today. K 5.9. 3. Hypertension/volume - BP mildly elevated, volume ok. Will titrate down volume as tolerated.  4. Anemia - Anticipated drop in Hgb post surgery, still at goal. Hgb 13>11.3. 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 PTH elevated. Continue calcitriol and phoslo. 6. Nutrition - Alb 2.6. Renal/carb modified diet. Add Nepro TID.  7. HIV - well controlled. Home meds. 8. Lupus - on prednisone 10 mg/day.   Jen Mow, PA-C [facility] Pager: [phone] 01/24/2017,12:22 PM  LOS: 2 days   Pt seen, examined and agree w A/P as above.  Kelly Splinter MD [facility] pager  [phone]   01/24/2017, 1:45 PM    Filed Weights   01/21/17 2241 01/23/17 1003  Weight: 65.8 kg (145 lb) 65.8 kg (145 lb)    Intake/Output Summary (Last 24 hours) at 01/24/17 1222 Last data filed at 01/24/17 0450  Gross per 24 hour  Intake           948.51 ml  Output              100 ml  Net           848.51 ml    Additional Objective Labs: Basic Metabolic Panel:  Recent Labs Lab 01/21/17 2248 01/22/17 1225 01/23/17 1006 01/24/17 0359  NA 137 139 138 135  K 4.9 4.9 4.9 5.9*  CL 101 101  --  94*  CO2 25 27  --  24  GLUCOSE 142* 106* 80 105*  BUN 26* 32*  --  49*  CREATININE 10.30* 11.47*  --  9.91*  CALCIUM 8.8* 8.4*  --  7.9*  PHOS  --  2.4*  --  4.3   Liver Function Tests:  Recent Labs Lab 01/21/17 2248 01/22/17 1225 01/24/17 0359  AST 28  --   --   ALT 17  --   --   ALKPHOS 108  --   --   BILITOT 0.6  --   --   PROT 8.1  --   --  ALBUMIN 3.3* 3.0* 2.6*   CBC:  Recent Labs Lab 01/21/17 2248 01/22/17 1225 01/22/17 1949 01/23/17 0655 01/23/17 1006 01/24/17 0359  WBC 10.7* 9.9 11.2* 13.9*  --  17.8*  NEUTROABS 8.3*  --   --   --   --   --   HGB 14.2 14.5 13.4 13.0 13.3 11.3*  HCT 42.4 44.7 40.9 39.3 39.0 35.2*  MCV 100.5* 100.2* 100.7* 100.8*  --  99.2  PLT 168 123* 133* 164  --  155   Blood Culture    Component Value Date/Time   SDES BLOOD RIGHT HAND 01/23/2017 0017   SDES BLOOD RIGHT HAND 01/23/2017 0017   SPECREQUEST IN PEDIATRIC BOTTLE Blood Culture adequate volume 01/23/2017 0017   SPECREQUEST IN PEDIATRIC BOTTLE Blood Culture adequate volume 01/23/2017 0017   CULT NO GROWTH 1 DAY 01/23/2017 0017   CULT NO GROWTH 1 DAY 01/23/2017 0017   REPTSTATUS PENDING 01/23/2017 0017   REPTSTATUS PENDING 01/23/2017 0017    Medications: . sodium chloride 10 mL/hr at 01/23/17 1619   . abacavir  600 mg Oral q1800  . amitriptyline  10 mg Oral QHS  . calcium acetate  1,334 mg Oral TID WC  . dolutegravir  50 mg Oral q1800  . enoxaparin  (LOVENOX) injection  30 mg Subcutaneous Q24H  . hydroxychloroquine  200 mg Oral BID  . lamiVUDine  50 mg Oral q1800  . multivitamin with minerals  1 tablet Oral Daily  . polyethylene glycol  17 g Oral Daily  . predniSONE  10 mg Oral Daily

## 2017-01-24 NOTE — Progress Notes (Signed)
Orthopedic Tech Progress Note Patient Details:  Kenneth [name] 25-Oct-1955 [id number]  Ortho Devices Type of Ortho Device: Knee Immobilizer, Sling immobilizer Ortho Device/Splint Location: rle knee imm., rue sling imm. Ortho Device/Splint Interventions: Ordered, Application, Adjustment   Braulio Bosch 01/24/2017, 4:04 PM

## 2017-01-24 NOTE — Progress Notes (Addendum)
Vendor came to deliver hinge brace but pt had just left for dialysis. Stated he will return tomorrow. Will pass along to next shift.

## 2017-01-24 NOTE — Progress Notes (Signed)
Orthopedic Tech Progress Note Patient Details:  Kenneth [name] 09-18-1955 [id number]  Patient ID: [name], male   DOB: 08/21/1955, 61 y.o.   MRN: [id number]   [name] 01/24/2017, 1:19 PM Called in [employer] brace order ; spoke with [facility]

## 2017-01-24 NOTE — Evaluation (Addendum)
Physical Therapy Evaluation Patient Details Name: Kenneth [name] MRN: [id number] DOB: 09/26/1955 Today's Date: 01/24/2017   History of Present Illness  Pt is a 61 y.o. male presenting with mechanical fall resulting in right distal clavicle fx, right scapula fx, and right tibia plateau fx; now s/p R tibial plateau ORIF with meniscus repair on 01/23/17. Pertinent PMH includes chronic fatigue, lupus, HIV, history of DVT, dysphagia, HLD, joint swelling.     Clinical Impression  Pt presents with pain and an overall decrease in functional mobility secondary to above. PTA, pt independent and lives at home with his partner who will be available for 24/7 assist if needed; pt works and enjoys traveling. Pt required minA+2 for safety to stand with RW and take scooting step on LLE. Evaluation limited secondary to arrival of transport for pt's dialysis. Feel pt would benefit from CIR-level therapies at d/c to maximize functional mobility and independence prior to d/c home. PT will continue to follow acutely to address established goals.    Follow Up Recommendations CIR;Supervision for mobility/OOB    Equipment Recommendations  Other (comment) (TBD)    Recommendations for Other Services       Precautions / Restrictions Precautions Precautions: Fall Restrictions Weight Bearing Restrictions: Yes RUE Weight Bearing: Weight bearing as tolerated RLE Weight Bearing: Non weight bearing Other Position/Activity Restrictions: RUE sling for comfort      Mobility  Bed Mobility Overal bed mobility: Needs Assistance Bed Mobility: Supine to Sit     Supine to sit: Mod assist;HOB elevated     General bed mobility comments: ModA to assist RLE to EOB and assist trunk into elevation as pt hesitant to use RUE to push trunk into sitting  Transfers Overall transfer level: Needs assistance Equipment used: Rolling walker (2 wheeled) Transfers: Sit to/from Stand Sit to Stand: Min assist;+2 safety/equipment          General transfer comment: MinA+2 to boost into standing; cues for technique and hand placement with RW. Pt able to support himself with BUEs by gripping onto walker  Ambulation/Gait Ambulation/Gait assistance: Min assist Ambulation Distance (Feet): 1 Feet Assistive device: Rolling walker (2 wheeled)       General Gait Details: MinA to take scooting step sideways on L foot while standing at EOB with RW and cues for technique. Pt with good ability to maintain RLE NWB precautions. Further mobility limited secondary to arrival of transport for HD  Stairs            Wheelchair Mobility    Modified Rankin (Stroke Patients Only)       Balance Overall balance assessment: Needs assistance Sitting-balance support: Single extremity supported;Feet supported Sitting balance-Leahy Scale: Poor Sitting balance - Comments: Pt with left lateral lean. Mod assist to maintain upright sitting posture.  Postural control: Left lateral lean Standing balance support: Bilateral upper extremity supported;During functional activity Standing balance-Leahy Scale: Poor Standing balance comment: Reliant on UE support to maintain RLE NWB precautions                             Pertinent Vitals/Pain Pain Assessment: Faces Faces Pain Scale: Hurts little more Pain Location: RLE > R shoulder Pain Descriptors / Indicators: Operative site guarding;Grimacing;Discomfort Pain Intervention(s): Monitored during session    Home Living Family/patient expects to be discharged to:: Private residence Living Arrangements: Spouse/significant other Available Help at Discharge: Family;Available 24 hours/day (Partner) Type of Home: Apartment Home Access: Level entry  Home Layout: One level Home Equipment: Shower seat - built in;Crutches      Prior Function Level of Independence: Independent         Comments: 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[employer]; drives     Journalist, newspaper    Dominant Hand: Right    Extremity/Trunk Assessment   Upper Extremity Assessment Upper Extremity Assessment: RUE deficits/detail RUE Deficits / Details: s/p R clavicle and scapular fx (WBAT); good grip strength RUE: Unable to fully assess due to pain    Lower Extremity Assessment Lower Extremity Assessment: RLE deficits/detail RLE Deficits / Details: s/p R tibial ORIF; R hip flex grossly 3/5 RLE: Unable to fully assess due to pain;Unable to fully assess due to immobilization    Cervical / Trunk Assessment Cervical / Trunk Assessment: Normal  Communication   Communication: No difficulties  Cognition Arousal/Alertness: Awake/alert Behavior During Therapy: WFL for tasks assessed/performed Overall Cognitive Status: Within Functional Limits for tasks assessed                                        General Comments General comments (skin integrity, edema, etc.): Pt reports he lives with his partner who is able to assist as needed upon d/c home. Pt works full time and enjoys traveling. Limited eval due to transport taking pt for dialysis     Exercises     Assessment/Plan    PT Assessment Patient needs continued PT services  PT Problem List Decreased strength;Decreased range of motion;Decreased activity tolerance;Decreased balance;Decreased mobility;Decreased knowledge of use of DME;Decreased knowledge of precautions;Pain       PT Treatment Interventions DME instruction;Gait training;Stair training;Functional mobility training;Therapeutic activities;Therapeutic exercise;Balance training;Patient/family education;Wheelchair mobility training    PT Goals (Current goals can be found in the Care Plan section)  Acute Rehab PT Goals Patient Stated Goal: Return to work PT Goal Formulation: With patient Time For Goal Achievement: 02/07/17 Potential to Achieve Goals: Good    Frequency Min 5X/week   Barriers to discharge        Co-evaluation PT/OT/SLP  Co-Evaluation/Treatment: Yes Reason for Co-Treatment: Complexity of the patient's impairments (multi-system involvement);For patient/therapist safety PT goals addressed during session: Mobility/safety with mobility;Balance;Proper use of DME OT goals addressed during session: ADL's and self-care       AM-PAC PT "6 Clicks" Daily Activity  Outcome Measure Difficulty turning over in bed (including adjusting bedclothes, sheets and blankets)?: Unable Difficulty moving from lying on back to sitting on the side of the bed? : Unable Difficulty sitting down on and standing up from a chair with arms (e.g., wheelchair, bedside commode, etc,.)?: Unable Help needed moving to and from a bed to chair (including a wheelchair)?: A Little Help needed walking in hospital room?: A Lot Help needed climbing 3-5 steps with a railing? : A Lot 6 Click Score: 10    End of Session Equipment Utilized During Treatment: Gait belt Activity Tolerance: Patient tolerated treatment well Patient left: in bed;Other (comment);with nursing/sitter in room (with transport for dialysis) Nurse Communication: Mobility status PT Visit Diagnosis: Other abnormalities of gait and mobility (R26.89);Pain Pain - Right/Left: Right Pain - part of body: Shoulder;Leg    Time: 4098-1191 PT Time Calculation (min) (ACUTE ONLY): 19 min   Charges:   PT Evaluation $PT Eval Moderate Complexity: 1 Mod     PT G Codes:       Mabeline Caras, PT, DPT Acute Rehab Services  Pager: [place] 01/24/2017, 3:03 PM

## 2017-01-24 NOTE — Progress Notes (Signed)
Family Medicine Teaching Service Daily Progress Note Intern Pager: [phone]  Patient name: Kenneth [name] Medical record number: [id number] Date of birth: 25-Jun-1955 Age: 61 y.o. Gender: male  Primary Care Provider: Nicolette Bang, DO Consultants: orthopedics, nephrology  Code Status: full   Pt Overview and Major Events to Date:  Admitted to [facility] on 10/22  Assessment and Plan: FAIZAAN FALLS is a 61 y.o. male presenting with mechanical fall resulting in multiple fractures. PMH is significant for HIV, ESRD (dialysis MWF), lupus nephritis  R lateral tibial plateau fracture, R displaced distal clavicle fracture, right superior scapular fracture 2/2 mechanical fall Mechanical fall resulting in multiple fractures including nondisplaced R clavicle, R lateral tibial plateau. POD#1 ORIF R tib/fib, meniscus repair, anterior fasciotomy. NWB RLE. WBAT RUE. Patient is recovering well with mild pain. Will ask PT/OT to evaluate for placement needs. Given his pain with RUE and NBW RLE he will likely need SNF or acute rehab placement. Will ask social work for help in this regard. Given that he is POD#1 he is getting oral narcotics with dilaudid breakthrough. Will wean these down daily. - renal diet - appreciate orthopedic recommendations - norco 7.5mg  (1 or 2) depending on pain q 4 hours prn - dilaudid 0.5mg  q3hrs PRN q3hours - f/u pt/ot - discuss with social work  - lovenox 30mg  dialy for dvt ppx  ESRD Receives dialysis MWF with [facility]. No longer making urine.  - MWF dialysis - appreciate nephrology recs  Fever Resolved. Patient afebrile overnight. Most likely due to inflammatory reactants 2/2 bone fractures. Blood cultures no growth at 1 day. -continue to trend fever curve -consider starting abx if continues to spike fever given PMHx of HIV  -monitor blood cx   HIV Stable on ziagen, tivicay epivir. Followed by [name].  - continue the above  regimen  Hyperlipidemia Assessed by Dr. Juleen China on 07/05/16 and noted to have ASCVD risk >10%.Offered mod intensity statin vs: lifestyle modification. Plan to recheck in one year.   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 Stable - will continue prednisone 10mg  daily - continue plaquinel 200mg  BID  Constipation Patient has not had bm in three days. Given that he is also on opioids, will add miralax daily and dulcolax daily - miralax, dulcolax daily  FEN/GI: NPO, intermittent boluses Prophylaxis: heparin   Disposition: likely snf vs home with pt  Subjective:  No complaints this am. Tolerating PO. Had a small episode of vomit but says that "he does this pretty often". Has not had bm in three days. Will add mirilax.  Objective: Temp:  [98.1 F (36.7 C)-99.3 F (37.4 C)] 98.8 F (37.1 C) (10/24 1314) Pulse Rate:  [85-104] 101 (10/24 1314) Resp:  [14-20] 16 (10/24 1314) BP: (133-149)/(79-96) 144/79 (10/24 1314) SpO2:  [95 %-100 %] 98 % (10/24 1314) Physical Exam: General: awake and alert, NAD, laying in bed  Cardiovascular: RRR, no MRG. Palpable pt/dp bilaterally. Respiratory: CTAB, no wheezes, rales, or rhonchi  Abdomen: soft, non tender, non distended, bowel sounds normal  Extremities: Right leg wrapped and knee immobilized.. Tender to palpation under wrap. No developing hematomas appreciated.  Laboratory:  Recent Labs Lab 01/22/17 1949 01/23/17 0655 01/23/17 1006 01/24/17 0359  WBC 11.2* 13.9*  --  17.8*  HGB 13.4 13.0 13.3 11.3*  HCT 40.9 39.3 39.0 35.2*  PLT 133* 164  --  155    Recent Labs Lab 01/21/17 2248 01/22/17 1225 01/23/17 1006 01/24/17 0359  NA 137 139 138 135  K 4.9  4.9 4.9 5.9*  CL 101 101  --  94*  CO2 25 27  --  24  BUN 26* 32*  --  49*  CREATININE 10.30* 11.47*  --  9.91*  CALCIUM 8.8* 8.4*  --  7.9*  PROT 8.1  --   --   --   BILITOT 0.6  --   --   --   ALKPHOS 108  --   --   --   ALT 17  --   --   --   AST 28  --   --   --   GLUCOSE  142* 106* 80 105*     Imaging/Diagnostic Tests: Dg Clavicle Right  Result Date: 01/22/2017 CLINICAL DATA:  Right shoulder pain after fall. EXAM: RIGHT CLAVICLE - 2+ VIEWS COMPARISON:  Same day radiographs of the shoulder. FINDINGS: An acute, closed, comminuted intra-articular fracture of the distal clavicle is noted. Slight caudal angulation of the distal fracture fragments. Fracture lucencies are also noted of the included base of the coracoid, scapula above the level of the scapular spine and possibly the glenoid. No shoulder dislocations. The included humeral head and neck are intact. IMPRESSION: 1. Acute, comminuted, intra-articular fracture of the distal clavicle extending into the acromioclavicular joint. Slight caudal angulation of the distal fracture fragments. 2. Additional irregular fracture lucencies at the base of the coracoid and supraspinous portion of the scapula are noted on these images. 3. A fracture of the glenoid fossa is also seen. Electronically Signed   By: Ashley Royalty M.D.   On: 01/22/2017 03:43   Dg Shoulder Right  Result Date: 01/21/2017 CLINICAL DATA:  61 y/o  M; fall with pain and swelling. EXAM: RIGHT SHOULDER - 2+ VIEW COMPARISON:  None. FINDINGS: Irregularity of distal clavicle at the acromioclavicular joint with transverse lucency may representing acute fracture. No shoulder dislocation. No proximal humerus fracture. IMPRESSION: Irregularity of distal clavicle at acromioclavicular joint with lucencies may represent acute fracture. Correlate for focal tenderness and consider clavicle radiographs. Electronically Signed   By: Kristine Garbe M.D.   On: 01/21/2017 23:44   Dg Tibia/fibula Right  Result Date: 01/23/2017 CLINICAL DATA:  Fracture fixation EXAM: DG C-ARM 61-120 MIN; RIGHT TIBIA AND FIBULA - 2 VIEW COMPARISON:  01/2020 and 01/22/2017 FINDINGS: Depressed fracture of the lateral tibial plateau has been reduced now with normal joint space. Lateral plate  and screws in good position. IMPRESSION: ORIF lateral tibial plateau fracture with normal joint space on postoperative images. Electronically Signed   By: Franchot Gallo M.D.   On: 01/23/2017 14:00   Dg Tibia/fibula Right  Result Date: 01/21/2017 CLINICAL DATA:  Status post fall, with right lower leg pain. Initial encounter. EXAM: RIGHT TIBIA AND FIBULA - 2 VIEW COMPARISON:  None. FINDINGS: There is a comminuted fracture involving the lateral tibial plateau, with perhaps 8 mm of depression. A small knee joint effusion is noted. No additional fractures are seen. The ankle mortise is grossly unremarkable in appearance. The soft tissues are otherwise grossly unremarkable in appearance. IMPRESSION: Comminuted fracture involving the lateral tibial plateau, with perhaps 8 mm of depression. Small knee joint effusion noted. Electronically Signed   By: Garald Balding M.D.   On: 01/21/2017 23:50   Ct Head Wo Contrast  Result Date: 01/22/2017 CLINICAL DATA:  Status post fall forward, with right shoulder pain, and concern for head or cervical spine injury. Initial encounter. EXAM: CT HEAD WITHOUT CONTRAST CT CERVICAL SPINE WITHOUT CONTRAST TECHNIQUE: Multidetector CT imaging of the  head and cervical spine was performed following the standard protocol without intravenous contrast. Multiplanar CT image reconstructions of the cervical spine were also generated. COMPARISON:  None. FINDINGS: CT HEAD FINDINGS Brain: No evidence of acute infarction, hemorrhage, hydrocephalus, extra-axial collection or mass lesion/mass effect. The posterior fossa, including the cerebellum, brainstem and fourth ventricle, is within normal limits. The third and lateral ventricles, and basal ganglia are unremarkable in appearance. The cerebral hemispheres are symmetric in appearance, with normal gray-white differentiation. No mass effect or midline shift is seen. Vascular: No hyperdense vessel or unexpected calcification. Skull: There is no  evidence of fracture; visualized osseous structures are unremarkable in appearance. Sinuses/Orbits: The orbits are within normal limits. The paranasal sinuses and mastoid air cells are well-aerated. Other: Cerumen is noted filling the right external auditory canal. CT CERVICAL SPINE FINDINGS Alignment: Normal. Skull base and vertebrae: No acute fracture. No primary bone lesion or focal pathologic process. Soft tissues and spinal canal: No prevertebral fluid or swelling. No visible canal hematoma. Disc levels: Minimal disc space narrowing is noted along the lower cervical spine, with small anterior and posterior disc osteophyte complexes. Mild underlying facet disease is noted along the cervical spine. Upper chest: The visualized lung apices are clear. The thyroid gland is unremarkable. Other: No additional soft tissue abnormalities are seen. IMPRESSION: 1. No evidence of traumatic intracranial injury or fracture. 2. No evidence of fracture or subluxation along the cervical spine. 3. Cerumen noted filling the right external auditory canal. Electronically Signed   By: Garald Balding M.D.   On: 01/22/2017 06:39   Ct Cervical Spine Wo Contrast  Result Date: 01/22/2017 CLINICAL DATA:  Status post fall forward, with right shoulder pain, and concern for head or cervical spine injury. Initial encounter. EXAM: CT HEAD WITHOUT CONTRAST CT CERVICAL SPINE WITHOUT CONTRAST TECHNIQUE: Multidetector CT imaging of the head and cervical spine was performed following the standard protocol without intravenous contrast. Multiplanar CT image reconstructions of the cervical spine were also generated. COMPARISON:  None. FINDINGS: CT HEAD FINDINGS Brain: No evidence of acute infarction, hemorrhage, hydrocephalus, extra-axial collection or mass lesion/mass effect. The posterior fossa, including the cerebellum, brainstem and fourth ventricle, is within normal limits. The third and lateral ventricles, and basal ganglia are unremarkable  in appearance. The cerebral hemispheres are symmetric in appearance, with normal gray-white differentiation. No mass effect or midline shift is seen. Vascular: No hyperdense vessel or unexpected calcification. Skull: There is no evidence of fracture; visualized osseous structures are unremarkable in appearance. Sinuses/Orbits: The orbits are within normal limits. The paranasal sinuses and mastoid air cells are well-aerated. Other: Cerumen is noted filling the right external auditory canal. CT CERVICAL SPINE FINDINGS Alignment: Normal. Skull base and vertebrae: No acute fracture. No primary bone lesion or focal pathologic process. Soft tissues and spinal canal: No prevertebral fluid or swelling. No visible canal hematoma. Disc levels: Minimal disc space narrowing is noted along the lower cervical spine, with small anterior and posterior disc osteophyte complexes. Mild underlying facet disease is noted along the cervical spine. Upper chest: The visualized lung apices are clear. The thyroid gland is unremarkable. Other: No additional soft tissue abnormalities are seen. IMPRESSION: 1. No evidence of traumatic intracranial injury or fracture. 2. No evidence of fracture or subluxation along the cervical spine. 3. Cerumen noted filling the right external auditory canal. Electronically Signed   By: Garald Balding M.D.   On: 01/22/2017 06:39   Ct Knee Right Wo Contrast  Result Date: 01/22/2017 CLINICAL DATA:  Status post fall while walking up steps, with right knee pain and swelling. Initial encounter. EXAM: CT OF THE RIGHT KNEE WITHOUT CONTRAST TECHNIQUE: Multidetector CT imaging of the right knee was performed according to the standard protocol. Multiplanar CT image reconstructions were also generated. COMPARISON:  Right knee radiographs performed 01/21/2017 FINDINGS: Bones/Joint/Cartilage There is a comminuted fracture involving the lateral tibial plateau, extending into the metadiaphysis, with up to 9 mm of  depression. Multiple displaced fracture fragments are noted. There is mild fragmentation involving the lateral aspect of the tibial spine. There is also a small fracture at the lateral aspect of the fibular head. A large lipohemarthrosis is noted. Ligaments Suboptimally assessed by CT. Diffuse edema is noted about the lateral collateral ligament complex. Muscles and Tendons The visualized musculature is grossly unremarkable. Diffuse thickening of the distal quadriceps tendon may reflect some degree of injury. Soft tissues The vasculature is not well assessed without contrast. Soft tissue injury is noted about the knee. IMPRESSION: 1. Comminuted fracture involving the lateral tibial plateau, extending into the metadiaphysis, with up to 9 mm of depression. Mild fragmentation involves the lateral aspect of the tibial spine. 2. Small fracture at the lateral aspect of the fibular head. 3. Large lipohemarthrosis noted. 4. Diffuse thickening of the distal quadriceps tendon may reflect some degree of injury. 5. Diffuse edema about the lateral collateral ligament complex. Ligaments not well assessed on CT. 6. Soft tissue injury about the knee. Electronically Signed   By: Garald Balding M.D.   On: 01/22/2017 04:44   Ct Shoulder Right Wo Contrast  Result Date: 01/22/2017 CLINICAL DATA:  Status post fall forward, with right shoulder pain and swelling. Initial encounter. EXAM: CT OF THE UPPER RIGHT EXTREMITY WITHOUT CONTRAST TECHNIQUE: Multidetector CT imaging of the upper right extremity was performed according to the standard protocol. COMPARISON:  Right shoulder radiographs performed 01/21/2017 FINDINGS: Bones/Joint/Cartilage There is a mildly displaced fracture involving the distal aspect of the right clavicle, extending into the right acromioclavicular joint. There is also a mildly comminuted fracture involving the superior aspect of the right scapula, extending along the edge of the glenoid. No definite glenohumeral  joint effusion is seen. The cartilage is not well assessed on CT. Ligaments Suboptimally assessed by CT. Muscles and Tendons Mild diffuse soft tissue injury is noted along the right axilla. No definite focal abnormalities are seen with regard to the musculature. Soft tissues No new focal abnormalities are identified. IMPRESSION: 1. Mildly displaced fracture involving the distal aspect of the right clavicle, extending into the right acromioclavicular joint. 2. Mildly comminuted fracture involving the superior aspect of the right scapula, extending along the edge of the glenoid. 3. Mild diffuse soft tissue injury along the right axilla. Electronically Signed   By: Garald Balding M.D.   On: 01/22/2017 06:47   Dg Chest Port 1 View  Result Date: 01/22/2017 CLINICAL DATA:  61 year old male under preoperative evaluation prior to ORIF procedure tomorrow. EXAM: PORTABLE CHEST 1 VIEW COMPARISON:  Chest x-ray 02/23/2015. FINDINGS: Lung volumes are normal. No consolidative airspace disease. Minimal scarring or subsegmental atelectasis in the periphery of the left lung base. No pleural effusions. No pneumothorax. No pulmonary nodule or mass noted. Pulmonary vasculature and the cardiomediastinal silhouette are within normal limits. Atherosclerosis in the thoracic aorta. Surgical clips Barnabas Lister over the epigastric region. IMPRESSION: 1. No radiographic evidence of acute cardiopulmonary disease. 2. Aortic atherosclerosis. Electronically Signed   By: Vinnie Langton M.D.   On: 01/22/2017 19:00   Dg Knee  Right Port  Result Date: 01/23/2017 CLINICAL DATA:  ORIF right tibia . EXAM: PORTABLE RIGHT KNEE - 1-2 VIEW COMPARISON:  01/23/2017. FINDINGS: ORIF proximal tibia. Hardware intact. Anatomic alignment. Postsurgical changes noted in the soft tissues. IMPRESSION: ORIF proximal right tibia.  Anatomic alignment. Electronically Signed   By: Marcello Moores  Register   On: 01/23/2017 15:06   Dg C-arm 61-120 Min  Result Date:  01/23/2017 CLINICAL DATA:  Fracture fixation EXAM: DG C-ARM 61-120 MIN; RIGHT TIBIA AND FIBULA - 2 VIEW COMPARISON:  01/2020 and 01/22/2017 FINDINGS: Depressed fracture of the lateral tibial plateau has been reduced now with normal joint space. Lateral plate and screws in good position. IMPRESSION: ORIF lateral tibial plateau fracture with normal joint space on postoperative images. Electronically Signed   By: Franchot Gallo M.D.   On: 01/23/2017 14:00    Guadalupe Dawn, MD 01/24/2017, 2:16 PM PGY-1, [facility] Intern pager: [phone], text pages welcome

## 2017-01-24 NOTE — Therapy (Signed)
Occupational Therapy Evaluation Patient Details Name: BANYAN GOODCHILD MRN: [id number] DOB: [birth year] Today's Date: 01/24/2017    History of Present Illness Pt is a 61 y.o. male presenting with mechanical fall resulting in right distal clavicle fx, right sxap fx, and right tibia plateau fx. PMH includes but not limited to chronic fatigue, lupus, HIV, history of DVT, dysphagia, hyperlipidemia, joint swelling.    Clinical Impression   Pt reports being independent in ADLs and working full time PTA. Currently, pt requires min assist +2 for sit to stand transfer, max assist for LB ADLs, and mod assist for UB ADLs. Pt reports partner is available to provide assistance as needed upon d/c. Pt presents with a significant decrease in functional abilities and would benefit from intensive rehab at [facility] to facilitate return to PLOF. Pt would benefit from acute OT services to increase independence with ADLs and functional mobility. OT will follow acutely to address established goals.     Follow Up Recommendations  CIR;Supervision/Assistance - 24 hour    Equipment Recommendations  Other (comment) (TBD at next venue of care)    Recommendations for Other Services Rehab consult     Precautions / Restrictions Precautions Precautions: Fall;None Restrictions Weight Bearing Restrictions: Yes RUE Weight Bearing: Weight bearing as tolerated RLE Weight Bearing: Non weight bearing Other Position/Activity Restrictions: RUE sling for comfort      Mobility Bed Mobility Overal bed mobility: Needs Assistance Bed Mobility: Supine to Sit     Supine to sit: Mod assist;HOB elevated     General bed mobility comments: Mod assist for management of RLE and to achieve upright posture.   Transfers Overall transfer level: Needs assistance Equipment used: Rolling walker (2 wheeled) Transfers: Sit to/from Stand Sit to Stand: Min assist;+2 safety/equipment         General transfer comment: Min assist +2 to  boost into standing and balance.     Balance Overall balance assessment: Needs assistance Sitting-balance support: Single extremity supported;Feet supported Sitting balance-Leahy Scale: Poor Sitting balance - Comments: Pt with left lateral lean. Mod assist to maintain upright sitting posture.  Postural control: Left lateral lean Standing balance support: Bilateral upper extremity supported;During functional activity Standing balance-Leahy Scale: Fair Standing balance comment: Pt able to stand with RW with WBAT of RUE.                            ADL either performed or assessed with clinical judgement   ADL Overall ADL's : Needs assistance/impaired Eating/Feeding: Independent   Grooming: Supervision/safety;Set up;Sitting   Upper Body Bathing: Moderate assistance;Sitting   Lower Body Bathing: Maximal assistance;Sit to/from stand   Upper Body Dressing : Moderate assistance;Sitting   Lower Body Dressing: Maximal assistance;Sit to/from stand   Toilet Transfer: Minimal assistance;+2 for safety/equipment;Stand-pivot;RW Toilet Transfer Details (indicate cue type and reason): Simulated sit to stand from EOB          Functional mobility during ADLs: Minimal assistance;+2 for safety/equipment;Rolling walker General ADL Comments: Limited evaluation due to transport taking pt to dialysis. Will further assess      Vision Baseline Vision/History: Wears glasses Wears Glasses: Reading only Patient Visual Report: No change from baseline       Perception     Praxis      Pertinent Vitals/Pain Pain Assessment: Faces Faces Pain Scale: Hurts a little bit Pain Location: right leg (with movement) Pain Descriptors / Indicators: Operative site guarding Pain Intervention(s): Monitored during session  Hand Dominance Right   Extremity/Trunk Assessment Upper Extremity Assessment Upper Extremity Assessment: RUE deficits/detail RUE Deficits / Details: WBAT  RUE: Unable to  fully assess due to pain   Lower Extremity Assessment Lower Extremity Assessment: Defer to PT evaluation   Cervical / Trunk Assessment Cervical / Trunk Assessment: Normal   Communication Communication Communication: No difficulties   Cognition Arousal/Alertness: Awake/alert Behavior During Therapy: WFL for tasks assessed/performed Overall Cognitive Status: Within Functional Limits for tasks assessed                                     General Comments  Pt reports he lives with his partner who is able to assist as needed upon d/c home. Pt works full time and enjoys traveling. 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[employer]; drives        OT Problem List: Decreased knowledge of precautions;Decreased knowledge of use of DME or AE;Impaired balance (sitting and/or standing);Pain;Impaired UE functional use      OT Treatment/Interventions: Self-care/ADL training;Therapeutic exercise;DME and/or AE instruction;Therapeutic activities;Patient/family education;Balance training    OT Goals(Current goals can be found in the care plan section) Acute Rehab OT Goals Patient Stated Goal: To get better  OT Goal Formulation: With patient Time For Goal Achievement: 02/07/17 Potential to Achieve Goals: Good ADL Goals Pt Will Perform Lower Body Bathing: with modified independence;sit to/from stand  (AE as needed) Pt Will Perform Lower Body Dressing: with modified independence;sit to/from stand (AE as needed) Pt Will Transfer to Toilet: with modified independence;stand pivot transfer;regular height toilet Pt Will Perform Toileting - Clothing Manipulation and hygiene: with modified independence;sit to/from stand Pt Will Perform Tub/Shower Transfer: with modified independence;ambulating;shower seat;Shower transfer;rolling walker  OT Frequency: Min 2X/week   Barriers to D/C:            Co-evaluation PT/OT/SLP Co-Evaluation/Treatment: Yes Reason for Co-Treatment: Complexity of the patient's impairments (multi-system involvement);For patient/therapist safety   OT goals addressed during session: ADL's and self-care      AM-PAC PT "6 Clicks" Daily Activity     Outcome Measure Help from another person eating meals?: None Help from another person taking care of personal grooming?: None Help from another person toileting, which includes using toliet, bedpan, or urinal?: A Lot Help from another person bathing (including washing, rinsing, drying)?: A Lot Help from another person to put on and taking off regular upper body clothing?: A Lot Help from another person to put on and taking off regular lower body clothing?: A Lot 6 Click Score: 16   End of Session Equipment Utilized During Treatment: Gait belt;Rolling walker Nurse Communication: Mobility status;Weight bearing status  Activity Tolerance: Patient tolerated treatment well Patient left: in bed;with nursing/sitter in room  OT Visit Diagnosis: Other abnormalities of gait and mobility (R26.89)  Time: 0120-0139 OT Time Calculation (min): 19 min Charges:  OT General Charges $OT Visit: 1 Visit OT Evaluation $OT Eval Moderate Complexity: 1 Mod G-Codes:     Boykin Peek, OTS [phone]   Boykin Peek 01/24/2017, 2:17 PM

## 2017-01-24 NOTE — Progress Notes (Signed)
PHARMACY NOTE:  ANTIMICROBIAL RENAL DOSAGE ADJUSTMENT  Current antimicrobial regimen includes a mismatch between antimicrobial dosage and estimated renal function.  As per policy approved by the Pharmacy & Therapeutics and Medical Executive Committees, the antimicrobial dosage will be adjusted accordingly.  Current antimicrobial dosage:  Lamivudine 50mg  PO daily  Indication: HIV  Renal Function:  Estimated Creatinine Clearance: 7.5 mL/min (A) (by C-G formula based on SCr of 9.91 mg/dL (H)). [x]      On intermittent HD, scheduled: MWF []      On CRRT    Antimicrobial dosage has been changed to:  Lamivudine 25mg  PO daily  Additional comments:  Harry Bark D. Mina Marble, PharmD, BCPS Pager:  [phone] 01/24/2017, 1:11 PM

## 2017-01-24 NOTE — Progress Notes (Signed)
Rehab Admissions Coordinator Note:  Patient was screened by [name] for appropriateness for an Inpatient Acute Rehab Consult.  At this time, we are recommending Inpatient Rehab consult.  Jodell Cipro M 01/24/2017, 2:26 PM  I can be reached at [phone].

## 2017-01-24 NOTE — Progress Notes (Signed)
Orthopaedic Trauma Service (OTS)  1 Day Post-Op Procedure(s) (LRB): OPEN REDUCTION INTERNAL FIXATION (ORIF) TIBIAL PLATEAU; REPAIR OF LATERAL TIBIAL PLATEAU; ARTHROTOMY WITH MENISCUS REPAIR; ANTERIOR COMPARTMENT FASCIOTOMY (Right)  Subjective: Patient reports pain as mild.    Objective: Current Vitals Blood pressure 135/79, pulse 85, temperature 98.5 F (36.9 C), temperature source Oral, resp. rate 17, height 6\' 1"  (1.854 m), weight 65.8 kg (145 lb), SpO2 97 %. Vital signs in last 24 hours: Temp:  [98.1 F (36.7 C)-99.3 F (37.4 C)] 98.5 F (36.9 C) (10/24 0503) Pulse Rate:  [85-104] 85 (10/24 0503) Resp:  [14-20] 17 (10/24 0503) BP: (123-149)/(79-96) 135/79 (10/24 0503) SpO2:  [95 %-100 %] 97 % (10/24 0503) Weight:  [65.8 kg (145 lb)] 65.8 kg (145 lb) (10/23 1003)  Intake/Output from previous day: 10/23 0701 - 10/24 0700 In: 1148.5 [P.O.:290; I.V.:708.5; IV Piggyback:150] Out: 150 [Blood:150]  LABS  Recent Labs  01/22/17 1225 01/22/17 1949 01/23/17 0655 01/23/17 1006 01/24/17 0359  HGB 14.5 13.4 13.0 13.3 11.3*    Recent Labs  01/23/17 0655 01/23/17 1006 01/24/17 0359  WBC 13.9*  --  17.8*  RBC 3.90*  --  3.55*  HCT 39.3 39.0 35.2*  PLT 164  --  155    Recent Labs  01/22/17 1225 01/23/17 1006 01/24/17 0359  NA 139 138 135  K 4.9 4.9 5.9*  CL 101  --  94*  CO2 27  --  24  BUN 32*  --  49*  CREATININE 11.47*  --  9.91*  GLUCOSE 106* 80 105*  CALCIUM 8.4*  --  7.9*   No results for input(s): LABPT, INR in the last 72 hours.   Physical Exam RLE  Dressing intact, clean, dry  Edema/ swelling controlled  Sens: DPN, SPN, TN intact  Motor: EHL, FHL, and lessor toe ext and flex all intact grossly  Brisk cap refill, warm to touch  Assessment/Plan: 1 Day Post-Op Procedure(s) (LRB): OPEN REDUCTION INTERNAL FIXATION (ORIF) TIBIAL PLATEAU; REPAIR OF LATERAL TIBIAL PLATEAU; ARTHROTOMY WITH MENISCUS REPAIR; ANTERIOR COMPARTMENT FASCIOTOMY (Right) 1.  PT/OT WBAT RUEX and NWB RLE, sling for comfort only or when OOB 2. DVT proph per renal 3. F/u 8-14 days  Altamese Simla, MD [facility] [phone] (p)

## 2017-01-25 DIAGNOSIS — W19XXXA Unspecified fall, initial encounter: Secondary | ICD-10-CM

## 2017-01-25 DIAGNOSIS — S42031S Displaced fracture of lateral end of right clavicle, sequela: Secondary | ICD-10-CM

## 2017-01-25 DIAGNOSIS — S82141S Displaced bicondylar fracture of right tibia, sequela: Secondary | ICD-10-CM

## 2017-01-25 LAB — RENAL FUNCTION PANEL
ANION GAP: 15 (ref 5–15)
Albumin: 2.6 g/dL — ABNORMAL LOW (ref 3.5–5.0)
BUN: 23 mg/dL — ABNORMAL HIGH (ref 6–20)
CHLORIDE: 94 mmol/L — AB (ref 101–111)
CO2: 26 mmol/L (ref 22–32)
Calcium: 8.2 mg/dL — ABNORMAL LOW (ref 8.9–10.3)
Creatinine, Ser: 6.82 mg/dL — ABNORMAL HIGH (ref 0.61–1.24)
GFR calc Af Amer: 9 mL/min — ABNORMAL LOW (ref >60)
GFR calc non Af Amer: 8 mL/min — ABNORMAL LOW (ref >60)
GLUCOSE: 68 mg/dL (ref 65–99)
PHOSPHORUS: 4.3 mg/dL (ref 2.5–4.6)
POTASSIUM: 5 mmol/L (ref 3.5–5.1)
Sodium: 135 mmol/L (ref 135–145)

## 2017-01-25 LAB — SEX HORMONE BINDING GLOBULIN: Sex Hormone Binding: 58.9 nmol/L (ref 19.3–76.4)

## 2017-01-25 LAB — PTH, INTACT AND CALCIUM
Calcium, Total (PTH): 8.2 mg/dL — ABNORMAL LOW (ref 8.7–10.2)
PTH: 261 pg/mL — ABNORMAL HIGH (ref 15–65)

## 2017-01-25 LAB — CBC
HEMATOCRIT: 36.5 % — AB (ref 39.0–52.0)
HEMOGLOBIN: 11.9 g/dL — AB (ref 13.0–17.0)
MCH: 32.3 pg (ref 26.0–34.0)
MCHC: 32.6 g/dL (ref 30.0–36.0)
MCV: 99.2 fL (ref 78.0–100.0)
Platelets: 181 10*3/uL (ref 150–400)
RBC: 3.68 MIL/uL — ABNORMAL LOW (ref 4.22–5.81)
RDW: 14.8 % (ref 11.5–15.5)
WBC: 11.2 10*3/uL — ABNORMAL HIGH (ref 4.0–10.5)

## 2017-01-25 LAB — VITAMIN D 25 HYDROXY (VIT D DEFICIENCY, FRACTURES): VIT D 25 HYDROXY: 12.6 ng/mL — AB (ref 30.0–100.0)

## 2017-01-25 LAB — TESTOSTERONE, FREE: Testosterone, Free: 11 pg/mL (ref 7.2–24.0)

## 2017-01-25 LAB — TESTOSTERONE: TESTOSTERONE: 282 ng/dL (ref 264–916)

## 2017-01-25 LAB — CALCITRIOL (1,25 DI-OH VIT D)

## 2017-01-25 MED ORDER — VITAMIN D 1000 UNITS PO TABS
1000.0000 [IU] | ORAL_TABLET | Freq: Every day | ORAL | Status: DC
  Administered 2017-01-25 – 2017-01-27 (×2): 1000 [IU] via ORAL
  Filled 2017-01-25 (×2): qty 1

## 2017-01-25 MED ORDER — POLYETHYLENE GLYCOL 3350 17 G PO PACK
17.0000 g | PACK | Freq: Two times a day (BID) | ORAL | Status: DC
  Administered 2017-01-25 – 2017-01-27 (×3): 17 g via ORAL
  Filled 2017-01-25 (×3): qty 1

## 2017-01-25 MED ORDER — CALCITRIOL 0.5 MCG PO CAPS: 0.7500 ug | ORAL_CAPSULE | ORAL | Status: DC

## 2017-01-25 NOTE — Progress Notes (Signed)
Family Medicine Teaching Service Daily Progress Note Intern Pager: [phone]  Patient name: Kenneth [name] Medical record number: [id number] Date of birth: 11-Sep-1955 Age: 61 y.o. Gender: male  Primary Care Provider: Nicolette Bang, DO Consultants: orthopedics, nephrology  Code Status: Full   Pt Overview and Major Events to Date:  Admitted to [facility] on 01/22/2017  Assessment and Plan: Kenneth [name] a 61 y.o.malepresenting with mechanical fall resulting in multiple fractures. PMH is significant for HIV, ESRD (dialysis MWF), lupus nephritis  R lateral tibial plateau fracture, R displaced distal clavicle fracture, right superior scapular fracture 2/2 mechanical fall Mechanical fall resulting in multiple fractures including nondisplaced R clavicle, R lateral tibial plateau. Fall from relatively low height causing extensive fracturing likely 2/2 co-morbid conditions such as ESRD and chronic prednisone use. POD#3 ORIF R tib/fib, meniscus repair, anterior fasciotomy. Recommended for NWB RLE and WBAT RUE. Patient is recovering well with no complaints of pain. States minimal pain when working with PT. PT recommendations of CIR placement. Per CIR liason, will have a CIR bed available on 10/27. Medically stable for discharge.  -appreciate orthopedic recommendations -norco 7.5mg  (1 or 2) depending on pain q4hrs prn, will wean as tolerated. Per chart review patient is only using around 1 pill a day.  -PT/OT  Tachycardia Patient had elevated HR to 111 this morning. On exam monitor in HD reading HR in 100. Likely 2/2 HD pulling fluid off. May have component of pain as well.  -continue to monitor  Vitamin D deficiency  Vitamin D on 10/24 of 12.6. This along with other co-morbid conditions such as ESRD and chronic prednisone use, may be why patient experienced extensive fractures following very low height mechanical fall. Will consult nephrology on repletion recommendations  -vitamin D  1000 units daily  -calcitriol started by nephrology  -consider DEXA scan as outpatient   ESRD Receives dialysis MWF with [facility]. No longer making urine. Last HD on 10/24, net UF 2L.  -MWF dialysis -appreciate nephrology recs  Fever Resolved. Patient afebrile overnight. Most likely due to inflammatory reactants 2/2 bone fractures. Blood cultures NGTD. -continue to trend fever curve -consider starting abx if continues to spike fever given PMHx of HIV  -monitor blood cx   HIV Stable on ziagen, tivicay epivir. Followed by [name].  -continue home medications   Hyperlipidemia Assessed by Dr. Juleen China on 07/05/16 and noted to have ASCVD risk >10%. Offered mod intensity statin vs: lifestyle modification. Plan to recheck in one year.  -continue outpatient follow up   Lupus nephritis Followed by nephrology. Stable -continue prednisone 10mg  daily -continue plaquinel 200mg  BID  Constipation Patient has not had bm in 4 days. Patient currently taking miralax and dulcolas  -miralax bid, dulcolax daily  Disposition Patient hopeful for CIR placement. Spoke to [employer] who stated patient will likely have bed available today or tomorrow.   FEN/GI: NPO, intermittent boluses Prophylaxis: heparin   Disposition: CIR (SNF if CIR unavailable)   Subjective:  Patient today with no complaints. Denies SOB or Chest pain. Denies much pain in extremities. States minimal pain when he is working with PT. States he has still not had a bowel movement.   Objective: Temp:  [97.9 F (36.6 C)-99.4 F (37.4 C)] 99.2 F (37.3 C) (10/26 1329) Pulse Rate:  [87-113] 113 (10/26 1329) Resp:  [17-21] 17 (10/26 1329) BP: (97-134)/(62-88) 107/70 (10/26 1329) SpO2:  [98 %-100 %] 99 % (10/26 1329) Weight:  [134 lb 4.2 oz (60.9 kg)] 134 lb 4.2  oz (60.9 kg) (10/26 0657)   Intake/Output Summary (Last 24 hours) at 01/26/17 1352 Last data filed at 01/26/17 1124  Gross per 24 hour  Intake               330 ml  Output             1284 ml  Net             -954 ml    Physical Exam: General: awake and alert, laying in bed, currently on HD Cardiovascular: RRR, no MRG Respiratory: CTAB, no wheezes, rales or rhonchi  Abdomen: soft, non tender, non distended  Extremities: no edema, unable to move right shoulder, left leg wrapped and in immobilizer. 5/5 muscle strength in left extremities.   Laboratory:  Recent Labs Lab 01/24/17 0359 01/25/17 0546 01/26/17 0500  WBC 17.8* 11.2* 13.0*  HGB 11.3* 11.9* 11.2*  HCT 35.2* 36.5* 35.2*  PLT 155 181 218    Recent Labs Lab 01/21/17 2248  01/24/17 0359 01/25/17 0546 01/26/17 0500  NA 137  < > 135 135 138  K 4.9  < > 5.9* 5.0 4.8  CL 101  < > 94* 94* 95*  CO2 25  < > 24 26 28   BUN 26*  < > 49* 23* 59*  CREATININE 10.30*  < > 9.91* 6.82* 9.46*  CALCIUM 8.8*  < > 7.9*  8.2* 8.2* 8.4*  PROT 8.1  --   --   --   --   BILITOT 0.6  --   --   --   --   ALKPHOS 108  --   --   --   --   ALT 17  --   --   --   --   AST 28  --   --   --   --   GLUCOSE 142*  < > 105* 68 121*  < > = values in this interval not displayed.   Imaging/Diagnostic Tests: Dg Clavicle Right  Result Date: 01/22/2017 CLINICAL DATA:  Right shoulder pain after fall. EXAM: RIGHT CLAVICLE - 2+ VIEWS COMPARISON:  Same day radiographs of the shoulder. FINDINGS: An acute, closed, comminuted intra-articular fracture of the distal clavicle is noted. Slight caudal angulation of the distal fracture fragments. Fracture lucencies are also noted of the included base of the coracoid, scapula above the level of the scapular spine and possibly the glenoid. No shoulder dislocations. The included humeral head and neck are intact. IMPRESSION: 1. Acute, comminuted, intra-articular fracture of the distal clavicle extending into the acromioclavicular joint. Slight caudal angulation of the distal fracture fragments. 2. Additional irregular fracture lucencies at the base of the coracoid and  supraspinous portion of the scapula are noted on these images. 3. A fracture of the glenoid fossa is also seen. Electronically Signed   By: Ashley Royalty M.D.   On: 01/22/2017 03:43   Dg Shoulder Right  Result Date: 01/21/2017 CLINICAL DATA:  61 y/o  M; fall with pain and swelling. EXAM: RIGHT SHOULDER - 2+ VIEW COMPARISON:  None. FINDINGS: Irregularity of distal clavicle at the acromioclavicular joint with transverse lucency may representing acute fracture. No shoulder dislocation. No proximal humerus fracture. IMPRESSION: Irregularity of distal clavicle at acromioclavicular joint with lucencies may represent acute fracture. Correlate for focal tenderness and consider clavicle radiographs. Electronically Signed   By: Kristine Garbe M.D.   On: 01/21/2017 23:44   Dg Tibia/fibula Right  Result Date: 01/23/2017 CLINICAL DATA:  Fracture fixation EXAM:  DG C-ARM 61-120 MIN; RIGHT TIBIA AND FIBULA - 2 VIEW COMPARISON:  01/2020 and 01/22/2017 FINDINGS: Depressed fracture of the lateral tibial plateau has been reduced now with normal joint space. Lateral plate and screws in good position. IMPRESSION: ORIF lateral tibial plateau fracture with normal joint space on postoperative images. Electronically Signed   By: Franchot Gallo M.D.   On: 01/23/2017 14:00   Dg Tibia/fibula Right  Result Date: 01/21/2017 CLINICAL DATA:  Status post fall, with right lower leg pain. Initial encounter. EXAM: RIGHT TIBIA AND FIBULA - 2 VIEW COMPARISON:  None. FINDINGS: There is a comminuted fracture involving the lateral tibial plateau, with perhaps 8 mm of depression. A small knee joint effusion is noted. No additional fractures are seen. The ankle mortise is grossly unremarkable in appearance. The soft tissues are otherwise grossly unremarkable in appearance. IMPRESSION: Comminuted fracture involving the lateral tibial plateau, with perhaps 8 mm of depression. Small knee joint effusion noted. Electronically Signed   By:  Garald Balding M.D.   On: 01/21/2017 23:50   Ct Head Wo Contrast  Result Date: 01/22/2017 CLINICAL DATA:  Status post fall forward, with right shoulder pain, and concern for head or cervical spine injury. Initial encounter. EXAM: CT HEAD WITHOUT CONTRAST CT CERVICAL SPINE WITHOUT CONTRAST TECHNIQUE: Multidetector CT imaging of the head and cervical spine was performed following the standard protocol without intravenous contrast. Multiplanar CT image reconstructions of the cervical spine were also generated. COMPARISON:  None. FINDINGS: CT HEAD FINDINGS Brain: No evidence of acute infarction, hemorrhage, hydrocephalus, extra-axial collection or mass lesion/mass effect. The posterior fossa, including the cerebellum, brainstem and fourth ventricle, is within normal limits. The third and lateral ventricles, and basal ganglia are unremarkable in appearance. The cerebral hemispheres are symmetric in appearance, with normal gray-white differentiation. No mass effect or midline shift is seen. Vascular: No hyperdense vessel or unexpected calcification. Skull: There is no evidence of fracture; visualized osseous structures are unremarkable in appearance. Sinuses/Orbits: The orbits are within normal limits. The paranasal sinuses and mastoid air cells are well-aerated. Other: Cerumen is noted filling the right external auditory canal. CT CERVICAL SPINE FINDINGS Alignment: Normal. Skull base and vertebrae: No acute fracture. No primary bone lesion or focal pathologic process. Soft tissues and spinal canal: No prevertebral fluid or swelling. No visible canal hematoma. Disc levels: Minimal disc space narrowing is noted along the lower cervical spine, with small anterior and posterior disc osteophyte complexes. Mild underlying facet disease is noted along the cervical spine. Upper chest: The visualized lung apices are clear. The thyroid gland is unremarkable. Other: No additional soft tissue abnormalities are seen. IMPRESSION:  1. No evidence of traumatic intracranial injury or fracture. 2. No evidence of fracture or subluxation along the cervical spine. 3. Cerumen noted filling the right external auditory canal. Electronically Signed   By: Garald Balding M.D.   On: 01/22/2017 06:39   Ct Cervical Spine Wo Contrast  Result Date: 01/22/2017 CLINICAL DATA:  Status post fall forward, with right shoulder pain, and concern for head or cervical spine injury. Initial encounter. EXAM: CT HEAD WITHOUT CONTRAST CT CERVICAL SPINE WITHOUT CONTRAST TECHNIQUE: Multidetector CT imaging of the head and cervical spine was performed following the standard protocol without intravenous contrast. Multiplanar CT image reconstructions of the cervical spine were also generated. COMPARISON:  None. FINDINGS: CT HEAD FINDINGS Brain: No evidence of acute infarction, hemorrhage, hydrocephalus, extra-axial collection or mass lesion/mass effect. The posterior fossa, including the cerebellum, brainstem and fourth ventricle, is within  normal limits. The third and lateral ventricles, and basal ganglia are unremarkable in appearance. The cerebral hemispheres are symmetric in appearance, with normal gray-white differentiation. No mass effect or midline shift is seen. Vascular: No hyperdense vessel or unexpected calcification. Skull: There is no evidence of fracture; visualized osseous structures are unremarkable in appearance. Sinuses/Orbits: The orbits are within normal limits. The paranasal sinuses and mastoid air cells are well-aerated. Other: Cerumen is noted filling the right external auditory canal. CT CERVICAL SPINE FINDINGS Alignment: Normal. Skull base and vertebrae: No acute fracture. No primary bone lesion or focal pathologic process. Soft tissues and spinal canal: No prevertebral fluid or swelling. No visible canal hematoma. Disc levels: Minimal disc space narrowing is noted along the lower cervical spine, with small anterior and posterior disc osteophyte  complexes. Mild underlying facet disease is noted along the cervical spine. Upper chest: The visualized lung apices are clear. The thyroid gland is unremarkable. Other: No additional soft tissue abnormalities are seen. IMPRESSION: 1. No evidence of traumatic intracranial injury or fracture. 2. No evidence of fracture or subluxation along the cervical spine. 3. Cerumen noted filling the right external auditory canal. Electronically Signed   By: Garald Balding M.D.   On: 01/22/2017 06:39   Ct Knee Right Wo Contrast  Result Date: 01/22/2017 CLINICAL DATA:  Status post fall while walking up steps, with right knee pain and swelling. Initial encounter. EXAM: CT OF THE RIGHT KNEE WITHOUT CONTRAST TECHNIQUE: Multidetector CT imaging of the right knee was performed according to the standard protocol. Multiplanar CT image reconstructions were also generated. COMPARISON:  Right knee radiographs performed 01/21/2017 FINDINGS: Bones/Joint/Cartilage There is a comminuted fracture involving the lateral tibial plateau, extending into the metadiaphysis, with up to 9 mm of depression. Multiple displaced fracture fragments are noted. There is mild fragmentation involving the lateral aspect of the tibial spine. There is also a small fracture at the lateral aspect of the fibular head. A large lipohemarthrosis is noted. Ligaments Suboptimally assessed by CT. Diffuse edema is noted about the lateral collateral ligament complex. Muscles and Tendons The visualized musculature is grossly unremarkable. Diffuse thickening of the distal quadriceps tendon may reflect some degree of injury. Soft tissues The vasculature is not well assessed without contrast. Soft tissue injury is noted about the knee. IMPRESSION: 1. Comminuted fracture involving the lateral tibial plateau, extending into the metadiaphysis, with up to 9 mm of depression. Mild fragmentation involves the lateral aspect of the tibial spine. 2. Small fracture at the lateral aspect  of the fibular head. 3. Large lipohemarthrosis noted. 4. Diffuse thickening of the distal quadriceps tendon may reflect some degree of injury. 5. Diffuse edema about the lateral collateral ligament complex. Ligaments not well assessed on CT. 6. Soft tissue injury about the knee. Electronically Signed   By: Garald Balding M.D.   On: 01/22/2017 04:44   Ct Shoulder Right Wo Contrast  Result Date: 01/22/2017 CLINICAL DATA:  Status post fall forward, with right shoulder pain and swelling. Initial encounter. EXAM: CT OF THE UPPER RIGHT EXTREMITY WITHOUT CONTRAST TECHNIQUE: Multidetector CT imaging of the upper right extremity was performed according to the standard protocol. COMPARISON:  Right shoulder radiographs performed 01/21/2017 FINDINGS: Bones/Joint/Cartilage There is a mildly displaced fracture involving the distal aspect of the right clavicle, extending into the right acromioclavicular joint. There is also a mildly comminuted fracture involving the superior aspect of the right scapula, extending along the edge of the glenoid. No definite glenohumeral joint effusion is seen. The cartilage  is not well assessed on CT. Ligaments Suboptimally assessed by CT. Muscles and Tendons Mild diffuse soft tissue injury is noted along the right axilla. No definite focal abnormalities are seen with regard to the musculature. Soft tissues No new focal abnormalities are identified. IMPRESSION: 1. Mildly displaced fracture involving the distal aspect of the right clavicle, extending into the right acromioclavicular joint. 2. Mildly comminuted fracture involving the superior aspect of the right scapula, extending along the edge of the glenoid. 3. Mild diffuse soft tissue injury along the right axilla. Electronically Signed   By: Garald Balding M.D.   On: 01/22/2017 06:47   Dg Chest Port 1 View  Result Date: 01/22/2017 CLINICAL DATA:  61 year old male under preoperative evaluation prior to ORIF procedure tomorrow. EXAM:  PORTABLE CHEST 1 VIEW COMPARISON:  Chest x-ray 02/23/2015. FINDINGS: Lung volumes are normal. No consolidative airspace disease. Minimal scarring or subsegmental atelectasis in the periphery of the left lung base. No pleural effusions. No pneumothorax. No pulmonary nodule or mass noted. Pulmonary vasculature and the cardiomediastinal silhouette are within normal limits. Atherosclerosis in the thoracic aorta. Surgical clips Barnabas Lister over the epigastric region. IMPRESSION: 1. No radiographic evidence of acute cardiopulmonary disease. 2. Aortic atherosclerosis. Electronically Signed   By: Vinnie Langton M.D.   On: 01/22/2017 19:00   Dg Knee Right Port  Result Date: 01/23/2017 CLINICAL DATA:  ORIF right tibia . EXAM: PORTABLE RIGHT KNEE - 1-2 VIEW COMPARISON:  01/23/2017. FINDINGS: ORIF proximal tibia. Hardware intact. Anatomic alignment. Postsurgical changes noted in the soft tissues. IMPRESSION: ORIF proximal right tibia.  Anatomic alignment. Electronically Signed   By: Marcello Moores  Register   On: 01/23/2017 15:06   Dg C-arm 61-120 Min  Result Date: 01/23/2017 CLINICAL DATA:  Fracture fixation EXAM: DG C-ARM 61-120 MIN; RIGHT TIBIA AND FIBULA - 2 VIEW COMPARISON:  01/2020 and 01/22/2017 FINDINGS: Depressed fracture of the lateral tibial plateau has been reduced now with normal joint space. Lateral plate and screws in good position. IMPRESSION: ORIF lateral tibial plateau fracture with normal joint space on postoperative images. Electronically Signed   By: Franchot Gallo M.D.   On: 01/23/2017 14:00    Caroline More, DO 01/26/2017, 1:52 PM PGY-1, [place] Intern pager: [phone], text pages welcome

## 2017-01-25 NOTE — Progress Notes (Signed)
Orthopedic Tech Progress Note Patient Details:  Kenneth [name] Jan 08, 1956 [id number]  Patient ID: [name], male   DOB: 1956/01/07, 61 y.o.   MRN: [id number]   [name] 01/25/2017, 11:54 [facility] for right Hinged knee brace.

## 2017-01-25 NOTE — Consult Note (Signed)
Physical Medicine and Rehabilitation Consult Reason for Consult: Decreased functional mobility Referring Physician: Internal medicine   HPI: Kenneth [name] is a 61 y.o. right handed male with history of end-stage renal disease with hemodialysis, hypertension, HIV, lupus nephritis. Per chart review patient lives with significant other. 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 One level home with level entry. Presented 01/22/2017 after mechanical fall with brief loss of consciousness. Cranial CT scan negative. CT cervical spine negative. X-rays and imaging revealed right distal clavicle fracture and right scapular fracture as well as right tibia plateau fracture. Underwent ORIF lateral tibial plateau fracture, arthrotomy with meniscus repair, anterior compartment fasciotomy 1023 per Dr. Marcelino Scot. Hospital course pain management. Hemodialysis ongoing as per renal services. Nonweightbearing right lower extremity with hinged knee brace. Weightbearing as tolerated right upper extremity and sling for comfort. Subcutaneous heparin for DVT prophylaxis. Physical and occupational therapy evaluations completed with recommendations of physical medicine rehabilitation consult.   Review of Systems  Constitutional: Negative for chills and fever.  HENT: Negative for hearing loss.   Eyes: Negative for blurred vision and double vision.  Respiratory: Negative for cough.        Occasional shortness of breath with exertion  Cardiovascular: Positive for palpitations and leg swelling. Negative for chest pain.  Gastrointestinal: Positive for constipation. Negative for nausea and vomiting.       GERD  Genitourinary: Negative for flank pain and hematuria.  Musculoskeletal: Positive for joint pain and myalgias.  Skin: Negative for rash.  Neurological: Negative for seizures and weakness.  All other systems reviewed and are negative.  Past Medical History:    Diagnosis Date  . Achalasia   . Candida infection, esophageal ([place]) 08/2010   a. 2012 - noted on EGD.  Marland Kitchen DVT (deep venous thrombosis) ([place]) ~ 04/2014   ?RLE  . Dysphagia 2008   post heller myotomy/toupee fundoplication.    . ESRD (end stage renal disease) on dialysis [facility])    "MWF: [name]" (01/22/2017)  . Fall 01/22/2017   mechanical fall w/multiple fractures/notes 01/22/2017  . GERD (gastroesophageal reflux disease)    hx (01/22/2017)  . Hemorrhoids, internal   . History of blood transfusion 03/2014   "low counts"  . History of gout   . History of stomach ulcers   . HIV infection ([place]) dx ~ 2009   a. 02/13/2014 CD4 = 240 - undetectable viral load.  . Hyperlipidemia   . Hypertension    hx (01/22/2017)  . Insomnia   . Lupus nephritis ([place])   . Pancytopenia ([place])   . Premature ventricular contractions   . Renal abscess   . SLE (systemic lupus erythematosus) ([place]) dx'd 2015   Past Surgical History:  Procedure Laterality Date  . BASCILIC VEIN TRANSPOSITION Left 06/29/2014   Procedure: FIRST STAGE  [place];  Surgeon: Rosetta Posner, MD;  Location: [place];  Service: Vascular;  Laterality: Left;  . BASCILIC VEIN TRANSPOSITION Left 09/11/2014   Procedure: SECOND STAGE BASILIC VEIN TRANSPOSITION LEFT UPPER ARM;  Surgeon: Rosetta Posner, MD;  Location: [place];  Service: Vascular;  Laterality: Left;  . COLONOSCOPY  2008   .  tortuous colon, internal hemorrhoids.  no polyps or diverticulosis.   Marland Kitchen ESOPHAGOGASTRODUODENOSCOPY  08/2010   Dr [name].  08/2010 candida esophagitis, no obvious esophageal stricture. 02/2006 and 05/2006 dilated esophagus, narrowed distal esophagus but no stricture, was empirically Savary dilated  . ESOPHAGOGASTRODUODENOSCOPY N/A 06/27/2014  Procedure: ESOPHAGOGASTRODUODENOSCOPY (EGD);  Surgeon: [name], MD;  Location: [facility] ENDOSCOPY;  Service: Endoscopy;  Laterality: N/A;  . ESOPHAGOGASTRODUODENOSCOPY N/A 07/02/2014   Procedure:  ESOPHAGOGASTRODUODENOSCOPY (EGD);  Surgeon: [name], MD;  Location: [facility] ENDOSCOPY;  Service: Endoscopy;  Laterality: N/A;  with botox  . [place]   with toupee fundoplication of HH.   . INSERTION OF DIALYSIS CATHETER Right 06/29/2014   Procedure: INSERTION OF DIALYSIS CATHETER RIGHT IJ;  Surgeon: Rosetta Posner, MD;  Location: [place];  Service: Vascular;  Laterality: Right;  . NEPHRECTOMY Right ~ 2011  . ORIF TIBIA PLATEAU Right 01/23/2017   Procedure: OPEN REDUCTION INTERNAL FIXATION (ORIF) TIBIAL PLATEAU; REPAIR OF LATERAL TIBIAL PLATEAU; ARTHROTOMY WITH MENISCUS REPAIR; ANTERIOR COMPARTMENT FASCIOTOMY;  Surgeon: Altamese Woodlawn, MD;  Location: [place];  Service: Orthopedics;  Laterality: Right;   Family History  Problem Relation Age of Onset  . Colon cancer Father        deceased  . Other Mother        s/p pacemaker - alive and well.  . Hypertension Mother   . Other Unknown        5 brothers, 3 sisters - alive and well.   Social History:  reports that he has never smoked. He has never used smokeless tobacco. He reports that he does not drink alcohol or use drugs. Allergies: No Active Allergies Medications Prior to Admission  Medication Sig Dispense Refill  . abacavir (ZIAGEN) 300 MG tablet Take 2 tablets (600 mg total) by mouth daily. 60 tablet 5  . amitriptyline (ELAVIL) 10 MG tablet Take 10 mg by mouth at bedtime.     . calcium acetate (PHOSLO) 667 MG capsule Take 2 capsules (1,334 mg total) by mouth 3 (three) times daily with meals. 180 capsule 0  . dolutegravir (TIVICAY) 50 MG tablet TAKE 1 TABLET (50 MG TOTAL) BY MOUTH DAILY. 30 tablet 5  . hydroxychloroquine (PLAQUENIL) 200 MG tablet Take 200 mg by mouth 2 (two) times daily.  0  . lamiVUDine (EPIVIR) 10 MG/ML solution Take 5 mLs (50 mg total) by mouth daily. 480 mL 5  . Multiple Vitamin (MULTIVITAMIN WITH MINERALS) TABS tablet Take 1 tablet by mouth daily.    . predniSONE (DELTASONE) 10 MG tablet Take 10 mg by mouth  daily.    Marland Kitchen TIVICAY 50 MG tablet TAKE ONE TABLET (50 MG) BY MOUTH ONCE DAILY. STORE AT ROOM TEMPERATURE. 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 Pt able to support himself with BUEs by gripping onto walker Ambulation/Gait Ambulation/Gait assistance:  Min assist Ambulation Distance (Feet): 1 Feet Assistive device: Rolling walker (2 wheeled) General Gait Details: MinA to take scooting step sideways on L foot while standing at EOB with RW and cues for technique. Pt with good ability to maintain RLE NWB precautions. Further mobility limited secondary to arrival of transport for HD    ADL: ADL Overall ADL's : Needs assistance/impaired Eating/Feeding: Independent Grooming: Supervision/safety, Set up, Sitting Upper Body Bathing: Moderate assistance, Sitting Lower Body Bathing: Maximal assistance, Sit  to/from stand Upper Body Dressing : Moderate assistance, Sitting Lower Body Dressing: Maximal assistance, Sit to/from stand Toilet Transfer: Minimal assistance, +2 for safety/equipment, Stand-pivot, RW Toilet Transfer Details (indicate cue type and reason): Simulated sit to stand from EOB  Functional mobility during ADLs: Minimal assistance, +2 for safety/equipment, Rolling walker General ADL Comments: Limited evaluation due to transport taking pt to dialysis. Will further assess   Cognition: Cognition Overall Cognitive Status: Within Functional Limits for tasks assessed Orientation Level: Oriented X4 Cognition Arousal/Alertness: Awake/alert Behavior During Therapy: WFL for tasks assessed/performed Overall Cognitive Status: Within Functional Limits for tasks assessed  Blood pressure 127/75, pulse (!) 111, temperature 100.1 F (37.8 C), temperature source Oral, resp. rate 18, height 6\' 1"  (1.854 m), weight 62.4 kg (137 lb 9.1 oz), SpO2 100 %. Physical Exam  Vitals reviewed. Constitutional: He is oriented to person, place, and time.  HENT:  Head: Normocephalic.  Eyes: EOM are normal.  Neck: Normal range of motion. Neck supple. No thyromegaly present.  Cardiovascular: Normal rate, regular rhythm and normal heart sounds.   Respiratory: Effort normal and breath sounds normal. No respiratory distress.  GI: Soft. Bowel sounds are normal. He exhibits no distension.  Neurological: He is alert and oriented to person, place, and time.  RUE and RLE lmited by orthopedic injuries. LUE and LLE grossly 4/5. Cognitively appropriate. No sensory loss.   Skin:  Right lower extremity is dressed appropriately tender. Shoulder sling in place to right upper extremity    No results found for this or any previous visit (from the past 24 hour(s)). Dg Tibia/fibula Right  Result Date: 01/23/2017 CLINICAL DATA:  Fracture fixation EXAM: DG C-ARM 61-120 MIN; RIGHT TIBIA AND FIBULA - 2 VIEW COMPARISON:   01/2020 and 01/22/2017 FINDINGS: Depressed fracture of the lateral tibial plateau has been reduced now with normal joint space. Lateral plate and screws in good position. IMPRESSION: ORIF lateral tibial plateau fracture with normal joint space on postoperative images. Electronically Signed   By: Franchot Gallo M.D.   On: 01/23/2017 14:00   Dg Knee Right Port  Result Date: 01/23/2017 CLINICAL DATA:  ORIF right tibia . EXAM: PORTABLE RIGHT KNEE - 1-2 VIEW COMPARISON:  01/23/2017. FINDINGS: ORIF proximal tibia. Hardware intact. Anatomic alignment. Postsurgical changes noted in the soft tissues. IMPRESSION: ORIF proximal right tibia.  Anatomic alignment. Electronically Signed   By: Marcello Moores  Register   On: 01/23/2017 15:06   Dg C-arm 61-120 Min  Result Date: 01/23/2017 CLINICAL DATA:  Fracture fixation EXAM: DG C-ARM 61-120 MIN; RIGHT TIBIA AND FIBULA - 2 VIEW COMPARISON:  01/2020 and 01/22/2017 FINDINGS: Depressed fracture of the lateral tibial plateau has been reduced now with normal joint space. Lateral plate and screws in good position. IMPRESSION: ORIF lateral tibial plateau fracture with normal joint space on postoperative images. Electronically Signed   By: Franchot Gallo M.D.   On: 01/23/2017 14:00    Assessment/Plan: Diagnosis: Polytrauma as outline above with right clavicle,scapular, tib-fib fx 1. Does the need for close, 24  hr/day medical supervision in concert with the patient's rehab needs make it unreasonable for this patient to be served in a less intensive setting? Yes 2. Co-Morbidities requiring supervision/potential complications: pain mgt, wound care 3. Due to bladder management, bowel management, safety, skin/wound care, disease management and pain management, does the patient require 24 hr/day rehab nursing? Yes 4. Does the patient require coordinated care of a physician, rehab nurse, PT (1-2 hrs/day, 5 days/week) and OT (1-2 hrs/day, 5 days/week) to address physical and functional  deficits in the context of the above medical diagnosis(es)? Yes Addressing deficits in the following areas: balance, endurance, locomotion, strength, transferring, bowel/bladder control, bathing, dressing, feeding, grooming, toileting, cognition and psychosocial support 5. Can the patient actively participate in an intensive therapy program of at least 3 hrs of therapy per day at least 5 days per week? Yes 6. The potential for patient to make measurable gains while on inpatient rehab is excellent 7. Anticipated functional outcomes upon discharge from inpatient rehab are modified independent  with PT, modified independent and supervision with OT, n/a with SLP. 8. Estimated rehab length of stay to reach the above functional goals is: 13-15 days 9. Anticipated D/C setting: Home 10. Anticipated post D/C treatments: [facility] therapy 11. Overall Rehab/Functional Prognosis: excellent  RECOMMENDATIONS: This patient's condition is appropriate for continued rehabilitative care in the following setting: CIR Patient has agreed to participate in recommended program. Yes Note that insurance prior authorization may be required for reimbursement for recommended care.  Comment: Rehab Admissions Coordinator to follow up.  Thanks,  [name], MD, Mellody Drown    Cathlyn Parsons., PA-C 01/25/2017

## 2017-01-25 NOTE — Progress Notes (Signed)
Kenneth [name] Progress Note   Subjective:   Patient is resting in bed. PT at bedside, about to assist in transfer to chair. No new c/os.   Objective Vitals:   01/24/17 1800 01/24/17 1825 01/24/17 2048 01/25/17 0654  BP: 105/75 117/82 127/75 111/65  Pulse: (!) 103 (!) 107 (!) 111 98  Resp:  18 18 18   Temp:  97.8 F (36.6 C) 100.1 F (37.8 C) 98.9 F (37.2 C)  TempSrc:  Oral Oral Oral  SpO2:  98% 100% 100%  Weight:  62.4 kg (137 lb 9.1 oz)    Height:       Physical Exam General:NAD, thin BM Heart:tachycardia, regular rhythm Lungs:CTAB, no wheeze or rales Abdomen:soft, NT, ND, +BS Extremities:no edema b/l. Right LE in brace.  Dialysis Access: LU AVF +bruit/thrill   Filed Weights   01/23/17 1003 01/24/17 1415 01/24/17 1825  Weight: 65.8 kg (145 lb) 64.7 kg (142 lb 10.2 oz) 62.4 kg (137 lb 9.1 oz)    Intake/Output Summary (Last 24 hours) at 01/25/17 1216 Last data filed at 01/24/17 1825  Gross per 24 hour  Intake                0 ml  Output             2000 ml  Net            -2000 ml    Additional Objective Labs: Basic Metabolic Panel:  Recent Labs Lab 01/22/17 1225 01/23/17 1006 01/24/17 0359 01/25/17 0546  NA 139 138 135 135  K 4.9 4.9 5.9* 5.0  CL 101  --  94* 94*  CO2 27  --  24 26  GLUCOSE 106* 80 105* 68  BUN 32*  --  49* 23*  CREATININE 11.47*  --  9.91* 6.82*  CALCIUM 8.4*  --  7.9* 8.2*  PHOS 2.4*  --  4.3 4.3   Liver Function Tests:  Recent Labs Lab 01/21/17 2248 01/22/17 1225 01/24/17 0359 01/25/17 0546  AST 28  --   --   --   ALT 17  --   --   --   ALKPHOS 108  --   --   --   BILITOT 0.6  --   --   --   PROT 8.1  --   --   --   ALBUMIN 3.3* 3.0* 2.6* 2.6*   CBC:  Recent Labs Lab 01/21/17 2248 01/22/17 1225 01/22/17 1949 01/23/17 0655 01/23/17 1006 01/24/17 0359 01/25/17 0546  WBC 10.7* 9.9 11.2* 13.9*  --  17.8* 11.2*  NEUTROABS 8.3*  --   --   --   --   --   --   HGB 14.2 14.5 13.4 13.0 13.3 11.3* 11.9*   HCT 42.4 44.7 40.9 39.3 39.0 35.2* 36.5*  MCV 100.5* 100.2* 100.7* 100.8*  --  99.2 99.2  PLT 168 123* 133* 164  --  155 181   Blood Culture    Component Value Date/Time   SDES BLOOD RIGHT HAND 01/23/2017 0017   SDES BLOOD RIGHT HAND 01/23/2017 0017   SPECREQUEST IN PEDIATRIC BOTTLE Blood Culture adequate volume 01/23/2017 0017   SPECREQUEST IN PEDIATRIC BOTTLE Blood Culture adequate volume 01/23/2017 0017   CULT NO GROWTH 1 DAY 01/23/2017 0017   CULT NO GROWTH 1 DAY 01/23/2017 0017   REPTSTATUS PENDING 01/23/2017 0017   REPTSTATUS PENDING 01/23/2017 0017   Studies/Results: Dg Tibia/fibula Right  Result Date: 01/23/2017 CLINICAL DATA:  Fracture fixation  EXAM: DG C-ARM 61-120 MIN; RIGHT TIBIA AND FIBULA - 2 VIEW COMPARISON:  01/2020 and 01/22/2017 FINDINGS: Depressed fracture of the lateral tibial plateau has been reduced now with normal joint space. Lateral plate and screws in good position. IMPRESSION: ORIF lateral tibial plateau fracture with normal joint space on postoperative images. Electronically Signed   By: Franchot Gallo M.D.   On: 01/23/2017 14:00   Dg Knee Right Port  Result Date: 01/23/2017 CLINICAL DATA:  ORIF right tibia . EXAM: PORTABLE RIGHT KNEE - 1-2 VIEW COMPARISON:  01/23/2017. FINDINGS: ORIF proximal tibia. Hardware intact. Anatomic alignment. Postsurgical changes noted in the soft tissues. IMPRESSION: ORIF proximal right tibia.  Anatomic alignment. Electronically Signed   By: Marcello Moores  Register   On: 01/23/2017 15:06   Dg C-arm 61-120 Min  Result Date: 01/23/2017 CLINICAL DATA:  Fracture fixation EXAM: DG C-ARM 61-120 MIN; RIGHT TIBIA AND FIBULA - 2 VIEW COMPARISON:  01/2020 and 01/22/2017 FINDINGS: Depressed fracture of the lateral tibial plateau has been reduced now with normal joint space. Lateral plate and screws in good position. IMPRESSION: ORIF lateral tibial plateau fracture with normal joint space on postoperative images. Electronically Signed   By:  Franchot Gallo M.D.   On: 01/23/2017 14:00    Medications: . sodium chloride 10 mL/hr at 01/23/17 1619   . abacavir  600 mg Oral q1800  . amitriptyline  10 mg Oral QHS  . calcium acetate  1,334 mg Oral TID WC  . dolutegravir  50 mg Oral q1800  . feeding supplement (NEPRO CARB STEADY)  237 mL Oral TID AC  . heparin  5,000 Units Subcutaneous Q8H  . hydroxychloroquine  200 mg Oral BID  . lamiVUDine  25 mg Oral q1800  . multivitamin with minerals  1 tablet Oral Daily  . polyethylene glycol  17 g Oral BID  . predniSONE  10 mg Oral Daily    Dialysis: MWF [facility] 4h   65kg   2/2   LUA AVF   Hep 5000 Venofer 100 mg IV x4HD - 1/4 completed Calcitriol 0.33mcg PO x3wk Phoslo  Last Labs PTA: 10/17Hgb 14.5, 10/10: TSAT 23%, K 4.9, Ca 9.2, P 4.1, PTH 574, Alb 3.7  Assessment/Plan: 1. Multiple fractures following mechanical fall(R clavicle, R scapula, R tibia plateau) - s/p ORIF, arthrotomy/meniscus repair and anterior compartment fasciotomy on 10/23 by Dr. Marcelino Scot. Other fractures non surgical management. Will need rehab. Will need to be able to do transfers for HD. Per Ortho PT/OT WBAT RUE and NWB RLE x 8wks.  2. Post OP fever/Leukocytosis - Improving. 100.28F max in last 24hrs. WBC trending down 17.8>11.2. 3. ESRD - MWF schedule - HD yesterday, tolerated well, net UF 2L, post weight 62.4kg.  Continue on regular schedule, while admitted, orders written. K 5.0. 4. Anemia of CKD- Hgb 11.9. In goal. follow 5. Secondary hyperparathyroidism - Ca and P in goal. Continue VDRA,  and binder. 6. HTN/volume - BP controlled. Under EDW, monitor. Will likely need new EDW at d/c.  7. Nutrition -  Alb 2.6. Renal/Carb modified diet.   8. HIV - well controlled. Home meds.  9. Lupus - on prednisone 10 mg/d and plaquinel  Jen Mow, PA-C [name] Pager: [phone] 01/25/2017,12:16 PM  LOS: 3 days   Pt seen, examined and agree w A/P as above.  Kelly Splinter MD [employer] pager [phone]   01/25/2017, 3:44 PM

## 2017-01-25 NOTE — Progress Notes (Signed)
Family Medicine Teaching Service Daily Progress Note Intern Pager: [phone]  Patient name: Kenneth [name] Medical record number: [id number] Date of birth: 1955-08-22 Age: 61 y.o. Gender: male  Primary Care Provider: Nicolette Bang, DO Consultants: orthopedics, nephrology  Code Status: full   Pt Overview and Major Events to Date:  Admitted to [facility] on 10/22  Assessment and Plan: Raylin Winer Parkeris a 61 y.o.malepresenting with mechanical fall resulting in multiple fractures. PMH is significant for HIV, ESRD (dialysis MWF), lupus nephritis  R lateral tibial plateau fracture, R displaced distal clavicle fracture, right superior scapular fracture 2/2 mechanical fall Mechanical fall resulting in multiple fractures including nondisplaced R clavicle, R lateral tibial plateau. POD#2 ORIF R tib/fib, meniscus repair, anterior fasciotomy. NWB RLE. WBAT RUE. Patient is recovering well with mild pain. PT recommendations of CIR placement. Social work working on [facility] placement vs. SNF if unable to obtain CIR placement. Given that he is POD#2 he is getting oral narcotics with dilaudid breakthrough. Will wean these down daily. - renal diet - appreciate orthopedic recommendations - norco 7.5mg  (1 or 2) depending on pain q 4 hours prn - will discontinue dilaudid 0.5mg  q3hrs PRN q3hours, per chart review patient has not received any since surgery  - f/u pt/ot - discuss with social work  - lovenox 30mg  dialy for dvt ppx  Vitamin D deficiency  Vitamin D on 10/24 of 12.6. This may be why patient experienced extensive fractures following very low height mechanical fall. Will consult nephrology on repletion.  -consider vitamin D supplementation and possible calcitriol -consider DEXA scan as outpatient   ESRD Receives dialysis MWF with [facility]. No longer making urine.  - MWF dialysis - appreciate nephrology recs  Fever Resolved. Patient afebrile overnight. Most likely due to  inflammatory reactants 2/2 bone fractures. Blood cultures NGTD. -continue to trend fever curve -consider starting abx if continues to spike fever given PMHx of HIV  -monitor blood cx   HIV Stable on ziagen, tivicay epivir. Followed by [name].  - continue the above regimen  Hyperlipidemia Assessed by Dr. Juleen China on 07/05/16 and noted to have ASCVD risk >10%. Offered mod intensity statin vs: lifestyle modification. Plan to recheck in one year.  -continue outpatient follow up   Lupus nephritis Followed by nephrology. Stable - will continue prednisone 10mg  daily - continue plaquinel 200mg  BID  Constipation Patient has not had bm in three days. Given that he is also on opioids, will add miralax daily and dulcolax daily - miralax bid, dulcolax daily  FEN/GI: NPO, intermittent boluses Prophylaxis: heparin   Disposition: CIR  Subjective:  Patient today with no complaints. Denies dizziness or lightheadedness. No fevers or chills. No nausea, vomiting, diarrhea. States some pain with movement but none at rest. Endorses constipation.   Objective: Temp:  [97.8 F (36.6 C)-100.1 F (37.8 C)] 98.9 F (37.2 C) (10/25 0654) Pulse Rate:  [98-111] 98 (10/25 0654) Resp:  [18] 18 (10/25 0654) BP: (105-127)/(65-82) 111/65 (10/25 0654) SpO2:  [98 %-100 %] 100 % (10/25 0654) Weight:  [137 lb 9.1 oz (62.4 kg)] 137 lb 9.1 oz (62.4 kg) (10/24 1825) Physical Exam: General: awake and alert, laying in bed with right leg elevated  Cardiovascular: RRR, no MRG Respiratory: CTAB, no wheezes, rales, or rhonchi  Abdomen: soft, non tender, non distended, bowel sounds normal  Extremities: no edema, unable to move right shoulder. Left leg wrapped and in immobilizer (elevated)   Laboratory:  Recent Labs Lab 01/23/17 0655 01/23/17 1006 01/24/17 0359 01/25/17  0546  WBC 13.9*  --  17.8* 11.2*  HGB 13.0 13.3 11.3* 11.9*  HCT 39.3 39.0 35.2* 36.5*  PLT 164  --  155 181    Recent Labs Lab  01/21/17 2248 01/22/17 1225 01/23/17 1006 01/24/17 0359 01/25/17 0546  NA 137 139 138 135 135  K 4.9 4.9 4.9 5.9* 5.0  CL 101 101  --  94* 94*  CO2 25 27  --  24 26  BUN 26* 32*  --  49* 23*  CREATININE 10.30* 11.47*  --  9.91* 6.82*  CALCIUM 8.8* 8.4*  --  7.9*  8.2* 8.2*  PROT 8.1  --   --   --   --   BILITOT 0.6  --   --   --   --   ALKPHOS 108  --   --   --   --   ALT 17  --   --   --   --   AST 28  --   --   --   --   GLUCOSE 142* 106* 80 105* 68     Ref. Range 01/24/2017 03:59  Vitamin D, 25-Hydroxy Latest Ref Range: 30.0 - 100.0 ng/mL 12.6 (L)    Ref. Range 01/24/2017 03:59  TSH Latest Ref Range: 0.350 - 4.500 uIU/mL 0.581    Ref. Range 01/24/2017 03:59  Sex Horm Binding Glob, Serum Latest Ref Range: 19.3 - 76.4 nmol/L 58.9  Testosterone Latest Ref Range: 264 - 916 ng/dL 282    Imaging/Diagnostic Tests: Dg Clavicle Right  Result Date: 01/22/2017 CLINICAL DATA:  Right shoulder pain after fall. EXAM: RIGHT CLAVICLE - 2+ VIEWS COMPARISON:  Same day radiographs of the shoulder. FINDINGS: An acute, closed, comminuted intra-articular fracture of the distal clavicle is noted. Slight caudal angulation of the distal fracture fragments. Fracture lucencies are also noted of the included base of the coracoid, scapula above the level of the scapular spine and possibly the glenoid. No shoulder dislocations. The included humeral head and neck are intact. IMPRESSION: 1. Acute, comminuted, intra-articular fracture of the distal clavicle extending into the acromioclavicular joint. Slight caudal angulation of the distal fracture fragments. 2. Additional irregular fracture lucencies at the base of the coracoid and supraspinous portion of the scapula are noted on these images. 3. A fracture of the glenoid fossa is also seen. Electronically Signed   By: Ashley Royalty M.D.   On: 01/22/2017 03:43   Dg Shoulder Right  Result Date: 01/21/2017 CLINICAL DATA:  61 y/o  M; fall with pain and  swelling. EXAM: RIGHT SHOULDER - 2+ VIEW COMPARISON:  None. FINDINGS: Irregularity of distal clavicle at the acromioclavicular joint with transverse lucency may representing acute fracture. No shoulder dislocation. No proximal humerus fracture. IMPRESSION: Irregularity of distal clavicle at acromioclavicular joint with lucencies may represent acute fracture. Correlate for focal tenderness and consider clavicle radiographs. Electronically Signed   By: Kristine Garbe M.D.   On: 01/21/2017 23:44   Dg Tibia/fibula Right  Result Date: 01/23/2017 CLINICAL DATA:  Fracture fixation EXAM: DG C-ARM 61-120 MIN; RIGHT TIBIA AND FIBULA - 2 VIEW COMPARISON:  01/2020 and 01/22/2017 FINDINGS: Depressed fracture of the lateral tibial plateau has been reduced now with normal joint space. Lateral plate and screws in good position. IMPRESSION: ORIF lateral tibial plateau fracture with normal joint space on postoperative images. Electronically Signed   By: Franchot Gallo M.D.   On: 01/23/2017 14:00   Dg Tibia/fibula Right  Result Date: 01/21/2017 CLINICAL DATA:  Status post fall, with right lower leg pain. Initial encounter. EXAM: RIGHT TIBIA AND FIBULA - 2 VIEW COMPARISON:  None. FINDINGS: There is a comminuted fracture involving the lateral tibial plateau, with perhaps 8 mm of depression. A small knee joint effusion is noted. No additional fractures are seen. The ankle mortise is grossly unremarkable in appearance. The soft tissues are otherwise grossly unremarkable in appearance. IMPRESSION: Comminuted fracture involving the lateral tibial plateau, with perhaps 8 mm of depression. Small knee joint effusion noted. Electronically Signed   By: Garald Balding M.D.   On: 01/21/2017 23:50   Ct Head Wo Contrast  Result Date: 01/22/2017 CLINICAL DATA:  Status post fall forward, with right shoulder pain, and concern for head or cervical spine injury. Initial encounter. EXAM: CT HEAD WITHOUT CONTRAST CT CERVICAL SPINE  WITHOUT CONTRAST TECHNIQUE: Multidetector CT imaging of the head and cervical spine was performed following the standard protocol without intravenous contrast. Multiplanar CT image reconstructions of the cervical spine were also generated. COMPARISON:  None. FINDINGS: CT HEAD FINDINGS Brain: No evidence of acute infarction, hemorrhage, hydrocephalus, extra-axial collection or mass lesion/mass effect. The posterior fossa, including the cerebellum, brainstem and fourth ventricle, is within normal limits. The third and lateral ventricles, and basal ganglia are unremarkable in appearance. The cerebral hemispheres are symmetric in appearance, with normal gray-white differentiation. No mass effect or midline shift is seen. Vascular: No hyperdense vessel or unexpected calcification. Skull: There is no evidence of fracture; visualized osseous structures are unremarkable in appearance. Sinuses/Orbits: The orbits are within normal limits. The paranasal sinuses and mastoid air cells are well-aerated. Other: Cerumen is noted filling the right external auditory canal. CT CERVICAL SPINE FINDINGS Alignment: Normal. Skull base and vertebrae: No acute fracture. No primary bone lesion or focal pathologic process. Soft tissues and spinal canal: No prevertebral fluid or swelling. No visible canal hematoma. Disc levels: Minimal disc space narrowing is noted along the lower cervical spine, with small anterior and posterior disc osteophyte complexes. Mild underlying facet disease is noted along the cervical spine. Upper chest: The visualized lung apices are clear. The thyroid gland is unremarkable. Other: No additional soft tissue abnormalities are seen. IMPRESSION: 1. No evidence of traumatic intracranial injury or fracture. 2. No evidence of fracture or subluxation along the cervical spine. 3. Cerumen noted filling the right external auditory canal. Electronically Signed   By: Garald Balding M.D.   On: 01/22/2017 06:39   Ct Cervical  Spine Wo Contrast  Result Date: 01/22/2017 CLINICAL DATA:  Status post fall forward, with right shoulder pain, and concern for head or cervical spine injury. Initial encounter. EXAM: CT HEAD WITHOUT CONTRAST CT CERVICAL SPINE WITHOUT CONTRAST TECHNIQUE: Multidetector CT imaging of the head and cervical spine was performed following the standard protocol without intravenous contrast. Multiplanar CT image reconstructions of the cervical spine were also generated. COMPARISON:  None. FINDINGS: CT HEAD FINDINGS Brain: No evidence of acute infarction, hemorrhage, hydrocephalus, extra-axial collection or mass lesion/mass effect. The posterior fossa, including the cerebellum, brainstem and fourth ventricle, is within normal limits. The third and lateral ventricles, and basal ganglia are unremarkable in appearance. The cerebral hemispheres are symmetric in appearance, with normal gray-white differentiation. No mass effect or midline shift is seen. Vascular: No hyperdense vessel or unexpected calcification. Skull: There is no evidence of fracture; visualized osseous structures are unremarkable in appearance. Sinuses/Orbits: The orbits are within normal limits. The paranasal sinuses and mastoid air cells are well-aerated. Other: Cerumen is noted filling the right external  auditory canal. CT CERVICAL SPINE FINDINGS Alignment: Normal. Skull base and vertebrae: No acute fracture. No primary bone lesion or focal pathologic process. Soft tissues and spinal canal: No prevertebral fluid or swelling. No visible canal hematoma. Disc levels: Minimal disc space narrowing is noted along the lower cervical spine, with small anterior and posterior disc osteophyte complexes. Mild underlying facet disease is noted along the cervical spine. Upper chest: The visualized lung apices are clear. The thyroid gland is unremarkable. Other: No additional soft tissue abnormalities are seen. IMPRESSION: 1. No evidence of traumatic intracranial injury  or fracture. 2. No evidence of fracture or subluxation along the cervical spine. 3. Cerumen noted filling the right external auditory canal. Electronically Signed   By: Garald Balding M.D.   On: 01/22/2017 06:39   Ct Knee Right Wo Contrast  Result Date: 01/22/2017 CLINICAL DATA:  Status post fall while walking up steps, with right knee pain and swelling. Initial encounter. EXAM: CT OF THE RIGHT KNEE WITHOUT CONTRAST TECHNIQUE: Multidetector CT imaging of the right knee was performed according to the standard protocol. Multiplanar CT image reconstructions were also generated. COMPARISON:  Right knee radiographs performed 01/21/2017 FINDINGS: Bones/Joint/Cartilage There is a comminuted fracture involving the lateral tibial plateau, extending into the metadiaphysis, with up to 9 mm of depression. Multiple displaced fracture fragments are noted. There is mild fragmentation involving the lateral aspect of the tibial spine. There is also a small fracture at the lateral aspect of the fibular head. A large lipohemarthrosis is noted. Ligaments Suboptimally assessed by CT. Diffuse edema is noted about the lateral collateral ligament complex. Muscles and Tendons The visualized musculature is grossly unremarkable. Diffuse thickening of the distal quadriceps tendon may reflect some degree of injury. Soft tissues The vasculature is not well assessed without contrast. Soft tissue injury is noted about the knee. IMPRESSION: 1. Comminuted fracture involving the lateral tibial plateau, extending into the metadiaphysis, with up to 9 mm of depression. Mild fragmentation involves the lateral aspect of the tibial spine. 2. Small fracture at the lateral aspect of the fibular head. 3. Large lipohemarthrosis noted. 4. Diffuse thickening of the distal quadriceps tendon may reflect some degree of injury. 5. Diffuse edema about the lateral collateral ligament complex. Ligaments not well assessed on CT. 6. Soft tissue injury about the  knee. Electronically Signed   By: Garald Balding M.D.   On: 01/22/2017 04:44   Ct Shoulder Right Wo Contrast  Result Date: 01/22/2017 CLINICAL DATA:  Status post fall forward, with right shoulder pain and swelling. Initial encounter. EXAM: CT OF THE UPPER RIGHT EXTREMITY WITHOUT CONTRAST TECHNIQUE: Multidetector CT imaging of the upper right extremity was performed according to the standard protocol. COMPARISON:  Right shoulder radiographs performed 01/21/2017 FINDINGS: Bones/Joint/Cartilage There is a mildly displaced fracture involving the distal aspect of the right clavicle, extending into the right acromioclavicular joint. There is also a mildly comminuted fracture involving the superior aspect of the right scapula, extending along the edge of the glenoid. No definite glenohumeral joint effusion is seen. The cartilage is not well assessed on CT. Ligaments Suboptimally assessed by CT. Muscles and Tendons Mild diffuse soft tissue injury is noted along the right axilla. No definite focal abnormalities are seen with regard to the musculature. Soft tissues No new focal abnormalities are identified. IMPRESSION: 1. Mildly displaced fracture involving the distal aspect of the right clavicle, extending into the right acromioclavicular joint. 2. Mildly comminuted fracture involving the superior aspect of the right scapula, extending along the  edge of the glenoid. 3. Mild diffuse soft tissue injury along the right axilla. Electronically Signed   By: Garald Balding M.D.   On: 01/22/2017 06:47   Dg Chest Port 1 View  Result Date: 01/22/2017 CLINICAL DATA:  61 year old male under preoperative evaluation prior to ORIF procedure tomorrow. EXAM: PORTABLE CHEST 1 VIEW COMPARISON:  Chest x-ray 02/23/2015. FINDINGS: Lung volumes are normal. No consolidative airspace disease. Minimal scarring or subsegmental atelectasis in the periphery of the left lung base. No pleural effusions. No pneumothorax. No pulmonary nodule or  mass noted. Pulmonary vasculature and the cardiomediastinal silhouette are within normal limits. Atherosclerosis in the thoracic aorta. Surgical clips Barnabas Lister over the epigastric region. IMPRESSION: 1. No radiographic evidence of acute cardiopulmonary disease. 2. Aortic atherosclerosis. Electronically Signed   By: Vinnie Langton M.D.   On: 01/22/2017 19:00   Dg Knee Right Port  Result Date: 01/23/2017 CLINICAL DATA:  ORIF right tibia . EXAM: PORTABLE RIGHT KNEE - 1-2 VIEW COMPARISON:  01/23/2017. FINDINGS: ORIF proximal tibia. Hardware intact. Anatomic alignment. Postsurgical changes noted in the soft tissues. IMPRESSION: ORIF proximal right tibia.  Anatomic alignment. Electronically Signed   By: Marcello Moores  Register   On: 01/23/2017 15:06   Dg C-arm 61-120 Min  Result Date: 01/23/2017 CLINICAL DATA:  Fracture fixation EXAM: DG C-ARM 61-120 MIN; RIGHT TIBIA AND FIBULA - 2 VIEW COMPARISON:  01/2020 and 01/22/2017 FINDINGS: Depressed fracture of the lateral tibial plateau has been reduced now with normal joint space. Lateral plate and screws in good position. IMPRESSION: ORIF lateral tibial plateau fracture with normal joint space on postoperative images. Electronically Signed   By: Franchot Gallo M.D.   On: 01/23/2017 14:00    Caroline More, DO 01/25/2017, 4:13 PM PGY-1, [facility] Intern pager: [phone], text pages welcome

## 2017-01-25 NOTE — Progress Notes (Signed)
Rehab admissions - I met with patient at the bedside.  He would like me to try for inpatient rehab approval.  I will submit to insurance carrier.  Will follow up once I hear back from insurance case manager.  Call me for questions.  #[phone]

## 2017-01-25 NOTE — H&P (Signed)
Physical Medicine and Rehabilitation Admission H&P    Chief Complaint  Patient presents with  . Fall  : HPI: Kenneth [name] is a 61 y.o. right handed male with history of end-stage renal disease with hemodialysis, hypertension, HIV, lupus nephritis. Per chart review patient lives with significant other. 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 One level home with level entry. Presented 01/22/2017 after mechanical fall with brief loss of consciousness. Cranial CT scan negative. CT cervical spine negative. X-rays and imaging revealed right distal clavicle fracture and right scapular fracture as well as right tibia plateau fracture. Underwent ORIF lateral tibial plateau fracture, arthrotomy with meniscus repair, anterior compartment fasciotomy 1023 per Dr. Marcelino Scot. Hospital course pain management. Hemodialysis ongoing as per renal services. Nonweightbearing right lower extremity with hinged knee brace. Weightbearing as tolerated right upper extremity and sling for comfort. Subcutaneous heparin for DVT prophylaxis. Physical and occupational therapy evaluations completed with recommendations of physical medicine rehabilitation consult. Patient was admitted for a comprehensive rehabilitation program  Review of Systems  Constitutional: Negative for chills and fever.  HENT: Negative for hearing loss.   Eyes: Negative for blurred vision and double vision.  Respiratory: Negative for cough.        Occasional shortness of breath with exertion  Cardiovascular: Positive for palpitations and leg swelling.  Gastrointestinal: Positive for constipation. Negative for nausea and vomiting.       GERD  Genitourinary: Negative for hematuria.  Musculoskeletal: Positive for joint pain and myalgias.  Skin: Negative for rash.  Neurological: Negative for seizures.  All other systems reviewed and are negative.  Past Medical History:  Diagnosis Date  . Achalasia    . Candida infection, esophageal ([place]) 08/2010   a. 2012 - noted on EGD.  Marland Kitchen DVT (deep venous thrombosis) ([place]) ~ 04/2014   ?RLE  . Dysphagia 2008   post heller myotomy/toupee fundoplication.    . ESRD (end stage renal disease) on dialysis [facility])    "MWF: [name]" (01/22/2017)  . Fall 01/22/2017   mechanical fall w/multiple fractures/notes 01/22/2017  . GERD (gastroesophageal reflux disease)    hx (01/22/2017)  . Hemorrhoids, internal   . History of blood transfusion 03/2014   "low counts"  . History of gout   . History of stomach ulcers   . HIV infection ([place]) dx ~ 2009   a. 02/13/2014 CD4 = 240 - undetectable viral load.  . Hyperlipidemia   . Hypertension    hx (01/22/2017)  . Insomnia   . Lupus nephritis ([place])   . Pancytopenia ([place])   . Premature ventricular contractions   . Renal abscess   . SLE (systemic lupus erythematosus) ([place]) dx'd 2015   Past Surgical History:  Procedure Laterality Date  . BASCILIC VEIN TRANSPOSITION Left 06/29/2014   Procedure: FIRST STAGE  [place];  Surgeon: Rosetta Posner, MD;  Location: [place];  Service: Vascular;  Laterality: Left;  . BASCILIC VEIN TRANSPOSITION Left 09/11/2014   Procedure: SECOND STAGE BASILIC VEIN TRANSPOSITION LEFT UPPER ARM;  Surgeon: Rosetta Posner, MD;  Location: [place];  Service: Vascular;  Laterality: Left;  . COLONOSCOPY  2008   .  tortuous colon, internal hemorrhoids.  no polyps or diverticulosis.   Marland Kitchen ESOPHAGOGASTRODUODENOSCOPY  08/2010   Dr [name].  08/2010 candida esophagitis, no obvious esophageal stricture. 02/2006 and 05/2006 dilated esophagus, narrowed distal esophagus but no stricture, was empirically Savary dilated  . ESOPHAGOGASTRODUODENOSCOPY N/A 06/27/2014   Procedure:  ESOPHAGOGASTRODUODENOSCOPY (EGD);  Surgeon: [name], MD;  Location: [facility] ENDOSCOPY;  Service: Endoscopy;  Laterality: N/A;  . ESOPHAGOGASTRODUODENOSCOPY N/A 07/02/2014   Procedure: ESOPHAGOGASTRODUODENOSCOPY (EGD);  Surgeon: [name], MD;  Location: [facility] ENDOSCOPY;  Service: Endoscopy;  Laterality: N/A;  with botox  . [place]   with toupee fundoplication of HH.   . INSERTION OF DIALYSIS CATHETER Right 06/29/2014   Procedure: INSERTION OF DIALYSIS CATHETER RIGHT IJ;  Surgeon: Rosetta Posner, MD;  Location: [place];  Service: Vascular;  Laterality: Right;  . NEPHRECTOMY Right ~ 2011  . ORIF TIBIA PLATEAU Right 01/23/2017   Procedure: OPEN REDUCTION INTERNAL FIXATION (ORIF) TIBIAL PLATEAU; REPAIR OF LATERAL TIBIAL PLATEAU; ARTHROTOMY WITH MENISCUS REPAIR; ANTERIOR COMPARTMENT FASCIOTOMY;  Surgeon: Altamese [place], MD;  Location: [place];  Service: Orthopedics;  Laterality: Right;   Family History  Problem Relation Age of Onset  . Colon cancer Father        deceased  . Other Mother        s/p pacemaker - alive and well.  . Hypertension Mother   . Other Unknown        5 brothers, 3 sisters - alive and well.   Social History:  reports that he has never smoked. He has never used smokeless tobacco. He reports that he does not drink alcohol or use drugs. Allergies: No Active Allergies Medications Prior to Admission  Medication Sig Dispense Refill  . abacavir (ZIAGEN) 300 MG tablet Take 2 tablets (600 mg total) by mouth daily. 60 tablet 5  . amitriptyline (ELAVIL) 10 MG tablet Take 10 mg by mouth at bedtime.     . calcium acetate (PHOSLO) 667 MG capsule Take 2 capsules (1,334 mg total) by mouth 3 (three) times daily with meals. 180 capsule 0  . dolutegravir (TIVICAY) 50 MG tablet TAKE 1 TABLET (50 MG TOTAL) BY MOUTH DAILY. 30 tablet 5  . hydroxychloroquine (PLAQUENIL) 200 MG tablet Take 200 mg by mouth 2 (two) times daily.  0  . lamiVUDine (EPIVIR) 10 MG/ML solution Take 5 mLs (50 mg total) by mouth daily. 480 mL 5  . Multiple Vitamin (MULTIVITAMIN WITH MINERALS) TABS tablet Take 1 tablet by mouth daily.    . predniSONE (DELTASONE) 10 MG tablet Take 10 mg by mouth daily.    Marland Kitchen TIVICAY 50 MG tablet TAKE ONE TABLET (50  MG) BY MOUTH ONCE DAILY. STORE AT ROOM TEMPERATURE. (Patient not taking: Reported on 01/22/2017) 30 tablet 2    Drug Regimen Review Drug regimen was reviewed and remains appropriate with no significant issues identified  Home: Home Living Family/patient expects to be discharged to:: Private residence Living Arrangements: Spouse/significant other Available Help at Discharge: Family, Available 24 hours/day (Partner) Type of Home: Apartment Home Access: Level entry Home Layout: One level Bathroom Shower/Tub: Multimedia programmer: Standard Home Equipment: Civil engineer, contracting - built in, Crutches   Functional History: Prior Function Level of Independence: Independent Comments: 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 Pt able to support himself with BUEs by gripping onto walker Ambulation/Gait Ambulation/Gait assistance: Min assist Ambulation Distance (Feet): 1 Feet Assistive device: Rolling walker (2 wheeled) General Gait Details: MinA to take scooting step sideways on L foot while standing at EOB with RW and cues for technique. Pt with good ability to maintain RLE NWB precautions. Further mobility limited secondary to arrival of transport for HD    ADL: ADL Overall ADL's : Needs assistance/impaired Eating/Feeding: Independent Grooming: Supervision/safety, Set up, Sitting Upper Body Bathing: Moderate  assistance, Sitting Lower Body Bathing: Maximal assistance, Sit to/from stand Upper Body Dressing : Moderate assistance, Sitting Lower Body Dressing: Maximal assistance, Sit to/from stand Toilet Transfer: Minimal assistance, +2 for safety/equipment, Stand-pivot, RW Toilet Transfer Details (indicate cue type and reason): Simulated sit to stand from EOB  Functional mobility during ADLs: Minimal assistance, +2 for safety/equipment, Rolling walker General ADL Comments: Limited evaluation due to transport taking pt to dialysis. Will further assess   Cognition: Cognition Overall Cognitive Status: Within Functional Limits for tasks assessed Orientation Level: Oriented X4 Cognition Arousal/Alertness: Awake/alert Behavior During Therapy: WFL for tasks assessed/performed Overall Cognitive Status: Within Functional Limits for tasks assessed  Physical Exam: Blood pressure 111/65, pulse 98, temperature 98.9 F (37.2 C), temperature source Oral, resp. rate 18, height 6' 1"  (1.854 m), weight 62.4 kg (137 lb 9.1 oz), SpO2 100 %. Physical Exam  Vitals reviewed. Constitutional: He appears well-developed and well-nourished. No distress.  HENT:  Head: Normocephalic.  Eyes: EOM are normal.  Neck: Normal range of motion. Neck supple. No thyromegaly present.  Cardiovascular: Normal rate, regular rhythm and normal heart sounds.  Exam reveals no friction rub.   No murmur heard. Respiratory: Effort normal and breath sounds normal. No respiratory distress.  GI: Soft. Bowel sounds are normal. He exhibits no distension. There is no tenderness. There is no rebound.  Skin. Right lower extremity is dressed appropriately tender with shoulder sling in place to right upper extremity Neurological: He is alert and oriented to person, place, and time. No CN abnl RUE and RLE with pain inhibition. LUE and LLE 4/5 proximal to distal. Sensory function intact in all 4's. Cognitively appropriate Psych: pleasant and  cooperative   Results for orders placed or performed during the hospital encounter of 01/22/17 (from the past 48 hour(s))  CBC     Status: Abnormal   Collection Time: 01/24/17  3:59 AM  Result Value Ref Range   WBC 17.8 (H) 4.0 - 10.5 K/uL   RBC 3.55 (L) 4.22 - 5.81 MIL/uL   Hemoglobin 11.3 (L) 13.0 - 17.0 g/dL   HCT 35.2 (L) 39.0 - 52.0 %   MCV 99.2 78.0 - 100.0 fL   MCH 31.8 26.0 - 34.0 pg   MCHC 32.1 30.0 - 36.0 g/dL   RDW 14.9 11.5 - 15.5 %   Platelets 155 150 - 400 K/uL  Renal function panel     Status: Abnormal   Collection Time: 01/24/17  3:59 AM  Result Value Ref Range   Sodium 135 135 - 145 mmol/L   Potassium 5.9 (H) 3.5 - 5.1 mmol/L    Comment: NO VISIBLE HEMOLYSIS   Chloride 94 (L) 101 - 111 mmol/L   CO2 24 22 - 32 mmol/L   Glucose, Bld 105 (H) 65 - 99 mg/dL   BUN 49 (H) 6 - 20 mg/dL   Creatinine, Ser 9.91 (H) 0.61 - 1.24 mg/dL   Calcium 7.9 (L) 8.9 - 10.3 mg/dL  Phosphorus 4.3 2.5 - 4.6 mg/dL   Albumin 2.6 (L) 3.5 - 5.0 g/dL   GFR calc non Af Amer 5 (L) >60 mL/min   GFR calc Af Amer 6 (L) >60 mL/min    Comment: (NOTE) The eGFR has been calculated using the CKD EPI equation. This calculation has not been validated in all clinical situations. eGFR's persistently <60 mL/min signify possible Chronic Kidney Disease.    Anion gap 17 (H) 5 - 15  VITAMIN D 25 Hydroxy (Vit-D Deficiency, Fractures)     Status: Abnormal   Collection Time: 01/24/17  3:59 AM  Result Value Ref Range   Vit D, 25-Hydroxy 12.6 (L) 30.0 - 100.0 ng/mL    Comment: (NOTE) Vitamin D deficiency has been defined by the [place] practice guideline as a level of serum 25-OH vitamin D less than 20 ng/mL (1,2). The Endocrine Society went on to further define vitamin D insufficiency as a level between 21 and 29 ng/mL (2). 1. IOM (Institute of Medicine). 2010. Dietary reference   intakes for calcium and D. [place]: The   [employer]. 2.  Holick MF, Binkley [place], Bischoff-Ferrari HA, et al.   Evaluation, treatment, and prevention of vitamin D   deficiency: an Endocrine Society clinical practice   guideline. JCEM. 2011 Jul; 96(7):1911-30. Performed At: [facility] [place], [place] [id number] Lindon Romp MD [id number]   TSH     Status: None   Collection Time: 01/24/17  3:59 AM  Result Value Ref Range   TSH 0.581 0.350 - 4.500 uIU/mL    Comment: Performed by a 3rd Generation assay with a functional sensitivity of <=0.01 uIU/mL.  Prealbumin     Status: None   Collection Time: 01/24/17  3:59 AM  Result Value Ref Range   Prealbumin 22.9 18 - 38 mg/dL  Testosterone     Status: None   Collection Time: 01/24/17  3:59 AM  Result Value Ref Range   Testosterone 282 264 - 916 ng/dL    Comment: (NOTE) Adult male reference interval is based on a population of healthy nonobese males (BMI <30) between 20 and 24 years old. [place], [place] [phone]. PMID: 89169450. Performed At: [facility] [place], [place] [id number] Lindon Romp MD [id number]   Sex hormone binding globulin     Status: None   Collection Time: 01/24/17  3:59 AM  Result Value Ref Range   Sex Hormone Binding 58.9 19.3 - 76.4 nmol/L    Comment: (NOTE) Performed At: [facility] [place], [place] [id number] Lindon Romp MD [id number]   Magnesium     Status: None   Collection Time: 01/24/17  3:59 AM  Result Value Ref Range   Magnesium 2.1 1.7 - 2.4 mg/dL  CBC     Status: Abnormal   Collection Time: 01/25/17  5:46 AM  Result Value Ref Range   WBC 11.2 (H) 4.0 - 10.5 K/uL   RBC 3.68 (L) 4.22 - 5.81 MIL/uL   Hemoglobin 11.9 (L) 13.0 - 17.0 g/dL   HCT 36.5 (L) 39.0 - 52.0 %   MCV 99.2 78.0 - 100.0 fL   MCH 32.3 26.0 - 34.0 pg   MCHC 32.6 30.0 - 36.0 g/dL   RDW 14.8 11.5 - 15.5 %   Platelets 181 150 - 400 K/uL  Renal function panel     Status: Abnormal    Collection Time: 01/25/17  5:46  AM  Result Value Ref Range   Sodium 135 135 - 145 mmol/L   Potassium 5.0 3.5 - 5.1 mmol/L   Chloride 94 (L) 101 - 111 mmol/L   CO2 26 22 - 32 mmol/L   Glucose, Bld 68 65 - 99 mg/dL   BUN 23 (H) 6 - 20 mg/dL   Creatinine, Ser 6.82 (H) 0.61 - 1.24 mg/dL   Calcium 8.2 (L) 8.9 - 10.3 mg/dL   Phosphorus 4.3 2.5 - 4.6 mg/dL   Albumin 2.6 (L) 3.5 - 5.0 g/dL   GFR calc non Af Amer 8 (L) >60 mL/min   GFR calc Af Amer 9 (L) >60 mL/min    Comment: (NOTE) The eGFR has been calculated using the CKD EPI equation. This calculation has not been validated in all clinical situations. eGFR's persistently <60 mL/min signify possible Chronic Kidney Disease.    Anion gap 15 5 - 15   Dg Tibia/fibula Right  Result Date: 01/23/2017 CLINICAL DATA:  Fracture fixation EXAM: DG C-ARM 61-120 MIN; RIGHT TIBIA AND FIBULA - 2 VIEW COMPARISON:  01/2020 and 01/22/2017 FINDINGS: Depressed fracture of the lateral tibial plateau has been reduced now with normal joint space. Lateral plate and screws in good position. IMPRESSION: ORIF lateral tibial plateau fracture with normal joint space on postoperative images. Electronically Signed   By: Franchot Gallo M.D.   On: 01/23/2017 14:00   Dg Knee Right Port  Result Date: 01/23/2017 CLINICAL DATA:  ORIF right tibia . EXAM: PORTABLE RIGHT KNEE - 1-2 VIEW COMPARISON:  01/23/2017. FINDINGS: ORIF proximal tibia. Hardware intact. Anatomic alignment. Postsurgical changes noted in the soft tissues. IMPRESSION: ORIF proximal right tibia.  Anatomic alignment. Electronically Signed   By: Marcello Moores  Register   On: 01/23/2017 15:06   Dg C-arm 61-120 Min  Result Date: 01/23/2017 CLINICAL DATA:  Fracture fixation EXAM: DG C-ARM 61-120 MIN; RIGHT TIBIA AND FIBULA - 2 VIEW COMPARISON:  01/2020 and 01/22/2017 FINDINGS: Depressed fracture of the lateral tibial plateau has been reduced now with normal joint space. Lateral plate and screws in good position.  IMPRESSION: ORIF lateral tibial plateau fracture with normal joint space on postoperative images. Electronically Signed   By: Franchot Gallo M.D.   On: 01/23/2017 14:00       Medical Problem List and Plan: 1.  Decreased functional mobility secondary to fall sustaining right clavicle fracture, right scapular fracture and right tibia fibula fracture-status post ORIF. Nonweightbearing right lower extremity with hinge knee brace. Weightbearing as tolerated right upper extremity with sling for comfort 2.  DVT Prophylaxis/Anticoagulation: Subcutaneous heparin. Check vascular study 3. Pain Management: Elavil 10 mg daily at bedtime, hydrocodone as needed 4. Mood: Provide emotional support 5. Neuropsych: This patient is capable of making decisions on his own behalf. 6. Skin/Wound Care: Routine skin checks 7. Fluids/Electrolytes/Nutrition: Routine INO's with follow-up chemistries 8. ESRD. Continue hemodialysis as per renal services 9. HIV. Continue antivirals 10. Lupus nephritis. Chronic prednisone as well as plaquenil    Post Admission Physician Evaluation: 1. Functional deficits secondary  to polytrauma. 2. Patient is admitted to receive collaborative, interdisciplinary care between the physiatrist, rehab nursing staff, and therapy team. 3. Patient's level of medical complexity and substantial therapy needs in context of that medical necessity cannot be provided at a lesser intensity of care such as a SNF. 4. Patient has experienced substantial functional loss from his/her baseline which was documented above under the "Functional History" and "Functional Status" headings.  Judging by the patient's diagnosis, physical exam, and  functional history, the patient has potential for functional progress which will result in measurable gains while on inpatient rehab.  These gains will be of substantial and practical use upon discharge  in facilitating mobility and self-care at the household  level. 5. Physiatrist will provide 24 hour management of medical needs as well as oversight of the therapy plan/treatment and provide guidance as appropriate regarding the interaction of the two. 6. The Preadmission Screening has been reviewed and patient status is unchanged unless otherwise stated above. 7. 24 hour rehab nursing will assist with bladder management, bowel management, safety, skin/wound care, disease management, medication administration, pain management and patient education  and help integrate therapy concepts, techniques,education, etc. 8. PT will assess and treat for/with: Lower extremity strength, range of motion, stamina, balance, functional mobility, safety, adaptive techniques and equipment, pain mgt, orthopedic precautions, family ed.   Goals are: mod I. 9. OT will assess and treat for/with: ADL's, functional mobility, safety, upper extremity strength, adaptive techniques and equipment, pain mgt, ortho precautions, family ed.   Goals are: mod I to supervision. Therapy may proceed with showering this patient. 10. SLP will assess and treat for/with: n/a.  Goals are: n/a. 11. Case Management and Social Worker will assess and treat for psychological issues and discharge planning. 12. Team conference will be held weekly to assess progress toward goals and to determine barriers to discharge. 13. Patient will receive at least 3 hours of therapy per day at least 5 days per week. 14. ELOS: 13-15 days       15. Prognosis:  excellent     [name], MD, [place] 01/27/2017  Cathlyn Parsons., PA-C 01/25/2017

## 2017-01-25 NOTE — Progress Notes (Signed)
Physical Therapy Treatment Patient Details Name: Kenneth [name] MRN: [id number] DOB: [birth year] Today's Date: 01/25/2017    History of Present Illness Pt is a 61 y.o. male presenting with mechanical fall resulting in right distal clavicle fx, right scapula fx, and right tibia plateau fx; now s/p R tibial plateau ORIF with meniscus repair on 01/23/17. Pertinent PMH includes chronic fatigue, lupus, HIV, history of DVT, dysphagia, HLD, joint swelling.    PT Comments    Pt requiring minA for all mobility today, including standing and transferring to/from w/c with RW; 1x LOB secondary to increased L-side lateral lean requiring modA to correct. Initiated manual w/c training this session, with pt requiring assist to set-up; limited ability to self-propel secondary to R-shoulder pain. Continue to feel pt would benefit from CIR-level therapies at d/c to maximize functional mobility and independence. Pt states he will have 24/7 support from his partner upon returning home. Will continue to follow acutely.    Follow Up Recommendations  CIR;Supervision for mobility/OOB     Equipment Recommendations  Rolling walker with 5" wheels;3in1 (PT);Wheelchair (measurements PT);Wheelchair cushion (measurements PT)    Recommendations for Other Services       Precautions / Restrictions Precautions Precautions: Fall Restrictions Weight Bearing Restrictions: Yes RUE Weight Bearing: Weight bearing as tolerated RLE Weight Bearing: Non weight bearing Other Position/Activity Restrictions: RUE sling for comfort    Mobility  Bed Mobility Overal bed mobility: Needs Assistance Bed Mobility: Supine to Sit     Supine to sit: Min assist     General bed mobility comments: MinA to assist RLE to EOB; increased time and effort required  Transfers Overall transfer level: Needs assistance Equipment used: Rolling walker (2 wheeled) Transfers: Sit to/from Stand Sit to Stand: Min assist         General  transfer comment: Pt stood from elevated bed and again from w/c with RW and minA for balance; pt with increased L-lateral lean contributing to instability. Unable to use RUE to push up into standing, but is able to lightly grip the RW with RUE  Ambulation/Gait Ambulation/Gait assistance: Min assist Ambulation Distance (Feet): 5 Feet (+2) Assistive device: Rolling walker (2 wheeled)   Gait velocity: Decreased   General Gait Details: Pt able to scoot from bed to w/c and from w/c to chair with RW and minA for balance/to steady RW. Pt with 1x LOB requiring modA to correct   Hotel manager mobility: Yes Wheelchair propulsion: Both upper extremities;Left lower extremity Wheelchair parts: Needs assistance Distance: 10 Wheelchair Assistance Details (indicate cue type and reason): Assist to set brakes (pt unable to reach R-side break secondary to R shoulder pain and limited ROM). Pt able to use BUEs (LUE > RUE) and LLE to self-propel w/c a short distance. Required cues for maneuvering w/c in room and assist to position RLE onto elevating leg rest  Modified Rankin (Stroke Patients Only)       Balance Overall balance assessment: Needs assistance Sitting-balance support: Single extremity supported;Feet supported Sitting balance-Leahy Scale: Fair Sitting balance - Comments: Min guard for safety Postural control: Left lateral lean Standing balance support: Bilateral upper extremity supported;During functional activity Standing balance-Leahy Scale: Poor Standing balance comment: Reliant on UE support to maintain RLE NWB precautions                            Cognition Arousal/Alertness: Awake/alert  Behavior During Therapy: Flat affect Overall Cognitive Status: Within Functional Limits for tasks assessed                                        Exercises      General Comments        Pertinent  Vitals/Pain Pain Assessment: Faces Faces Pain Scale: Hurts little more Pain Location: RLE > R shoulder Pain Descriptors / Indicators: Operative site guarding;Grimacing;Discomfort Pain Intervention(s): Monitored during session;Repositioned    Home Living                      Prior Function            PT Goals (current goals can now be found in the care plan section) Acute Rehab PT Goals Patient Stated Goal: Return to work PT Goal Formulation: With patient Time For Goal Achievement: 02/07/17 Potential to Achieve Goals: Good Progress towards PT goals: Progressing toward goals    Frequency    Min 5X/week      PT Plan Current plan remains appropriate    Co-evaluation              AM-PAC PT "6 Clicks" Daily Activity  Outcome Measure  Difficulty turning over in bed (including adjusting bedclothes, sheets and blankets)?: Unable Difficulty moving from lying on back to sitting on the side of the bed? : Unable Difficulty sitting down on and standing up from a chair with arms (e.g., wheelchair, bedside commode, etc,.)?: Unable Help needed moving to and from a bed to chair (including a wheelchair)?: A Little Help needed walking in hospital room?: A Lot Help needed climbing 3-5 steps with a railing? : A Lot 6 Click Score: 10    End of Session Equipment Utilized During Treatment: Gait belt Activity Tolerance: Patient tolerated treatment well Patient left: in chair;with call bell/phone within reach Nurse Communication: Mobility status PT Visit Diagnosis: Other abnormalities of gait and mobility (R26.89);Pain Pain - Right/Left: Right Pain - part of body: Shoulder;Leg     Time: 8299-3716 PT Time Calculation (min) (ACUTE ONLY): 33 min  Charges:  $Therapeutic Activity: 23-37 mins                    G Codes:      Mabeline Caras, PT, DPT Acute Rehab Services  Pager: [place] 01/25/2017, 12:50 PM

## 2017-01-26 LAB — RENAL FUNCTION PANEL
ANION GAP: 15 (ref 5–15)
Albumin: 2.4 g/dL — ABNORMAL LOW (ref 3.5–5.0)
BUN: 59 mg/dL — ABNORMAL HIGH (ref 6–20)
CHLORIDE: 95 mmol/L — AB (ref 101–111)
CO2: 28 mmol/L (ref 22–32)
CREATININE: 9.46 mg/dL — AB (ref 0.61–1.24)
Calcium: 8.4 mg/dL — ABNORMAL LOW (ref 8.9–10.3)
GFR, EST AFRICAN AMERICAN: 6 mL/min — AB (ref >60)
GFR, EST NON AFRICAN AMERICAN: 5 mL/min — AB (ref >60)
Glucose, Bld: 121 mg/dL — ABNORMAL HIGH (ref 65–99)
Phosphorus: 5 mg/dL — ABNORMAL HIGH (ref 2.5–4.6)
Potassium: 4.8 mmol/L (ref 3.5–5.1)
Sodium: 138 mmol/L (ref 135–145)

## 2017-01-26 LAB — CBC
HCT: 35.2 % — ABNORMAL LOW (ref 39.0–52.0)
Hemoglobin: 11.2 g/dL — ABNORMAL LOW (ref 13.0–17.0)
MCH: 31.4 pg (ref 26.0–34.0)
MCHC: 31.8 g/dL (ref 30.0–36.0)
MCV: 98.6 fL (ref 78.0–100.0)
PLATELETS: 218 10*3/uL (ref 150–400)
RBC: 3.57 MIL/uL — AB (ref 4.22–5.81)
RDW: 14.7 % (ref 11.5–15.5)
WBC: 13 10*3/uL — AB (ref 4.0–10.5)

## 2017-01-26 LAB — GLUCOSE, CAPILLARY: Glucose-Capillary: 102 mg/dL — ABNORMAL HIGH (ref 65–99)

## 2017-01-26 MED ORDER — PENTAFLUOROPROP-TETRAFLUOROETH EX AERO: 1.0000 "application " | INHALATION_SPRAY | CUTANEOUS | Status: DC | PRN

## 2017-01-26 MED ORDER — LIDOCAINE HCL (PF) 1 % IJ SOLN: 5.0000 mL | INTRAMUSCULAR | Status: DC | PRN

## 2017-01-26 MED ORDER — HEPARIN SODIUM (PORCINE) 1000 UNIT/ML DIALYSIS: 1000.0000 [IU] | INTRAMUSCULAR | Status: DC | PRN

## 2017-01-26 MED ORDER — ALTEPLASE 2 MG IJ SOLR: 2.0000 mg | Freq: Once | INTRAMUSCULAR | Status: DC | PRN

## 2017-01-26 MED ORDER — HEPARIN SODIUM (PORCINE) 1000 UNIT/ML DIALYSIS: 20.0000 [IU]/kg | INTRAMUSCULAR | Status: DC | PRN

## 2017-01-26 MED ORDER — LIDOCAINE-PRILOCAINE 2.5-2.5 % EX CREA: 1.0000 "application " | TOPICAL_CREAM | CUTANEOUS | Status: DC | PRN

## 2017-01-26 MED ORDER — SODIUM CHLORIDE 0.9 % IV SOLN: 100.0000 mL | INTRAVENOUS | Status: DC | PRN

## 2017-01-26 NOTE — Progress Notes (Signed)
OT Cancellation Note  Patient Details Name: Kenneth [name] MRN: [id number] DOB: October 26, 1955   Cancelled Treatment:    Reason Eval/Treat Not Completed: Patient at procedure or test/ unavailable (currently in HD). Will follow up for OT treat as time allows.  Binnie Kand M.S., OTR/L Pager: [phone]  01/26/2017, 11:00 AM

## 2017-01-26 NOTE — PMR Pre-admission (Signed)
PMR Admission Coordinator Pre-Admission Assessment  Patient: Kenneth [name] is an 61 y.o., male MRN: [id number] DOB: Feb 02, 1956 Height: 6' 1"  (185.4 cm) Weight: 60.9 kg (134 lb 4.2 oz)             Insurance Information HMO:      PPO: Yes     PCP:       IPA:       80/20:       OTHER:   PRIMARY: BCBS state health plan      Policy#: [id number]      Subscriber:  patient CM Name: Philis Fendt      Phone#: [phone]     Fax#: [phone] Pre-Cert#: [id number] for 01/27/17 to 02/10/17 with update due 02/09/17      Employer:   Benefits:  Phone #: [phone]     Name:  Online Eff. Date: 04/03/16     Deduct:  $1250 (met $1250)      Out of Pocket Max:  [phone] (met $4350)      Life Max: N/A CIR: 80% w/auth      SNF: 80% with 100 days max Outpatient: medical necessity     Co-Pay: $52/visit Home Health: 80%      Co-Pay: 20% DME: 80%     Co-Pay: 20% Providers: in network  Medicaid Application Date:        Case Manager:   Disability Application Date:        Case Worker:    Emergency Facilities manager Information    Name Relation Home Work Mobile   [place] Significant other   [place] Niece   [place] Mother [phone]       Current Medical History  Patient Admitting Diagnosis: Polytrauma as outline above with right clavicle,scapular, tib-fib fx   History of Present Illness: A 61 y.o. right handed male with history of end-stage renal disease with hemodialysis, hypertension, HIV, lupus nephritis. Per chart review patient lives with significant other. 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 One level home with level entry. Presented 01/22/2017 after mechanical fall with brief loss of consciousness. Cranial CT scan negative. CT cervical spine negative. X-rays and imaging revealed right distal clavicle fracture and right scapular fracture as well as right tibia plateau fracture.  Underwent ORIF lateral tibial plateau fracture, arthrotomy with meniscus repair, anterior compartment fasciotomy 1023 per Dr. Marcelino Scot. Hospital course pain management. Hemodialysis ongoing as per renal services. Nonweightbearing right lower extremity with hinged knee brace. Weightbearing as tolerated right upper extremity and sling for comfort. Subcutaneous heparin for DVT prophylaxis. Physical and occupational therapy evaluations completed with recommendations of physical medicine rehabilitation consult.    Past Medical History  Past Medical History:  Diagnosis Date  . Achalasia   . Candida infection, esophageal ([place]) 08/2010   a. 2012 - noted on EGD.  Marland Kitchen DVT (deep venous thrombosis) ([place]) ~ 04/2014   ?RLE  . Dysphagia 2008   post heller myotomy/toupee fundoplication.    . ESRD (end stage renal disease) on dialysis [facility])    "MWF: [name]" (01/22/2017)  . Fall 01/22/2017   mechanical fall w/multiple fractures/notes 01/22/2017  . GERD (gastroesophageal reflux disease)    hx (01/22/2017)  . Hemorrhoids, internal   . History of blood transfusion 03/2014   "low counts"  . History of gout   . History of stomach ulcers   . HIV infection ([place]) dx ~ 2009   a. 02/13/2014 CD4 =  240 - undetectable viral load.  . Hyperlipidemia   . Hypertension    hx (01/22/2017)  . Insomnia   . Lupus nephritis ([place])   . Pancytopenia ([place])   . Premature ventricular contractions   . Renal abscess   . SLE (systemic lupus erythematosus) ([place]) dx'd 2015    Family History  family history includes Colon cancer in his father; Hypertension in his mother; Other in his mother and unknown relative.  Prior Rehab/Hospitalizations: No previous rehab admissions   Has the patient had major surgery during 100 days prior to admission? No  Current Medications   Current Facility-Administered Medications:  .  0.9 %  sodium chloride infusion, , Intravenous, Continuous, Lillia Abed, MD, Last Rate: 10 mL/hr at 01/23/17  1619 .  abacavir (ZIAGEN) tablet 600 mg, 600 mg, Oral, q1800, Leeanne Rio, MD, 600 mg at 01/25/17 1711 .  acetaminophen (TYLENOL) tablet 650 mg, 650 mg, Oral, Q4H PRN **OR** acetaminophen (TYLENOL) suppository 650 mg, 650 mg, Rectal, Q4H PRN, Ainsley Spinner, PA-C .  amitriptyline (ELAVIL) tablet 10 mg, 10 mg, Oral, QHS, Eloise Levels, MD, 10 mg at 01/25/17 2206 .  calcitRIOL (ROCALTROL) capsule 0.75 mcg, 0.75 mcg, Oral, Q M,W,F-HD, Penninger, Lindsay, PA .  calcium acetate (PHOSLO) capsule 1,334 mg, 1,334 mg, Oral, TID WC, Eloise Levels, MD, 1,334 mg at 01/25/17 1710 .  cholecalciferol (VITAMIN D) tablet 1,000 Units, 1,000 Units, Oral, Daily, Wendee Beavers T, MD, 1,000 Units at 01/25/17 1709 .  docusate sodium (COLACE) capsule 100 mg, 100 mg, Oral, Daily PRN, Guadalupe Dawn, MD, 100 mg at 01/25/17 1712 .  dolutegravir (TIVICAY) tablet 50 mg, 50 mg, Oral, q1800, Leeanne Rio, MD, 50 mg at 01/25/17 1711 .  feeding supplement (NEPRO CARB STEADY) liquid 237 mL, 237 mL, Oral, TID AC, Penninger, Lindsay, PA, 237 mL at 01/25/17 1711 .  heparin injection 5,000 Units, 5,000 Units, Subcutaneous, Q8H, Guadalupe Dawn, MD, 5,000 Units at 01/26/17 1458 .  HYDROcodone-acetaminophen (NORCO) 7.5-325 MG per tablet 1 tablet, 1 tablet, Oral, Q4H PRN, Ainsley Spinner, PA-C, 1 tablet at 01/25/17 0636 .  HYDROcodone-acetaminophen (NORCO) 7.5-325 MG per tablet 2 tablet, 2 tablet, Oral, Q4H PRN, Ainsley Spinner, PA-C .  hydroxychloroquine (PLAQUENIL) tablet 200 mg, 200 mg, Oral, BID, Eloise Levels, MD, 200 mg at 01/25/17 2210 .  lamiVUDine (EPIVIR) 10 MG/ML solution 25 mg, 25 mg, Oral, q1800, Dang, Thuy D, RPH, 25 mg at 01/25/17 1710 .  metoCLOPramide (REGLAN) tablet 5-10 mg, 5-10 mg, Oral, Q8H PRN **OR** metoCLOPramide (REGLAN) injection 5-10 mg, 5-10 mg, Intravenous, Q8H PRN, Ainsley Spinner, PA-C .  multivitamin with minerals tablet 1 tablet, 1 tablet, Oral, Daily, Ainsley Spinner, PA-C, 1 tablet at 01/25/17  0825 .  ondansetron (ZOFRAN) tablet 4 mg, 4 mg, Oral, Q6H PRN **OR** ondansetron (ZOFRAN) injection 4 mg, 4 mg, Intravenous, Q6H PRN, Ainsley Spinner, PA-C .  polyethylene glycol (MIRALAX / GLYCOLAX) packet 17 g, 17 g, Oral, BID, Tammi Klippel, Sherin, DO, 17 g at 01/25/17 2206 .  predniSONE (DELTASONE) tablet 10 mg, 10 mg, Oral, Daily, Leeanne Rio, MD, 10 mg at 01/25/17 6812  Patients Current Diet: Diet renal with fluid restriction Fluid restriction: 1200 mL Fluid; Room service appropriate? Yes; Fluid consistency: Thin  Precautions / Restrictions Precautions Precautions: Fall Restrictions Weight Bearing Restrictions: Yes RUE Weight Bearing: Weight bearing as tolerated RLE Weight Bearing: Non weight bearing Other Position/Activity Restrictions: RUE sling for comfort   Has the patient had 2 or more falls or a fall  with injury in the past year?No  Prior Activity Level Community (5-7x/wk): Went out daily, was driving.  Home Assistive Devices / Equipment Home Assistive Devices/Equipment: Eyeglasses, Built-in shower seat Home Equipment: Shower seat - built in, Crutches  Prior Device Use: Indicate devices/aids used by the patient prior to current illness, exacerbation or injury? 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  Independent  Indoor Mobility: Did the patient need assistance with walking from room to room (with or without device)? Independent  Stairs: Did the patient need assistance with internal or external stairs (with or without device)? Independent  Functional Cognition: Did the patient need help planning regular tasks such as shopping or remembering to take medications? Independent  Current Functional Level Cognition  Overall Cognitive Status: Within Functional Limits for tasks  assessed Orientation Level: Oriented X4    Extremity Assessment (includes Sensation/Coordination)  Upper Extremity Assessment: RUE deficits/detail RUE Deficits / Details: s/p R clavicle and scapular fx (WBAT); good grip strength RUE: Unable to fully assess due to pain  Lower Extremity Assessment: RLE deficits/detail RLE Deficits / Details: s/p R tibial ORIF; R hip flex grossly 3/5 RLE: Unable to fully assess due to pain, Unable to fully assess due to immobilization    ADLs  Overall ADL's : Needs assistance/impaired Eating/Feeding: Independent Grooming: Supervision/safety, Set up, Sitting Upper Body Bathing: Moderate assistance, Sitting Lower Body Bathing: Maximal assistance, Sit to/from stand Upper Body Dressing : Moderate assistance, Sitting Lower Body Dressing: Maximal assistance, Sit to/from stand Toilet Transfer: Minimal assistance, +2 for safety/equipment, Stand-pivot, RW Toilet Transfer Details (indicate cue type and reason): Simulated sit to stand from EOB  Functional mobility during ADLs: Minimal assistance, +2 for safety/equipment, Rolling walker General ADL Comments: Limited evaluation due to transport taking pt to dialysis. Will further assess     Mobility  Overal bed mobility: Needs Assistance Bed Mobility: Supine to Sit, Sit to Supine Supine to sit: Min assist Sit to supine: Min assist General bed mobility comments: MinA to assist RLE to EOB; increased time and effort required. Cues for rolling technique with bed flat; increased effort as not able to assist well with RUE    Transfers  Overall transfer level: Needs assistance Equipment used: Rolling walker (2 wheeled) Transfers: Sit to/from Stand Sit to Stand: Min assist General transfer comment: Pt stood from elevated bed and again from w/c with RW and minA for balance; pt with increased L-lateral lean contributing to instability. Unable to use RUE to push up into standing, but is able to lightly grip the RW with RUE     Ambulation / Gait / Stairs / Wheelchair Mobility  Ambulation/Gait Ambulation/Gait assistance: Min assist Ambulation Distance (Feet): 5 Feet (+2) Assistive device: Rolling walker (2 wheeled) General Gait Details: Pt able to scoot from bed to w/c and from w/c to chair with RW and minA for balance/to steady RW. Pt with 1x LOB requiring modA to correct Gait velocity: Decreased Wheelchair Mobility Wheelchair mobility: Yes Wheelchair propulsion: Both upper extremities, Left lower extremity Wheelchair parts: Needs assistance Distance: 10 Wheelchair Assistance Details (indicate cue type and reason): Assist to set brakes (pt unable to reach R-side break secondary to R shoulder pain and limited ROM). Pt able to use BUEs (LUE > RUE) and LLE to self-propel w/c a short distance. Required cues for maneuvering w/c in room and assist to position  RLE onto elevating leg rest    Posture / Balance Dynamic Sitting Balance Sitting balance - Comments: Min guard for safety Balance Overall balance assessment: Needs assistance Sitting-balance support: Single extremity supported, Feet supported Sitting balance-Leahy Scale: Fair Sitting balance - Comments: Min guard for safety Postural control: Left lateral lean Standing balance support: Bilateral upper extremity supported, During functional activity Standing balance-Leahy Scale: Poor Standing balance comment: Reliant on UE support to maintain RLE NWB precautions    Special needs/care consideration BiPAP/CPAP No CPM No Continuous Drip IV KVO Dialysis Yes        Days M-W-F Life Vest No Oxygen No Special Bed No Trach Size No Wound Vac (area) No     Skin Has right leg hinged knee brace with dressing in place                             Bowel mgmt: No BM since 01/20/17 Bladder mgmt: Anuria Diabetic mgmt No    Previous Home Environment Living Arrangements: Spouse/significant other Available Help at Discharge: Family, Available 24 hours/day  (Partner) Type of Home: Apartment Home Layout: One level Home Access: Level entry Bathroom Shower/Tub: Multimedia programmer: Standard Home Care Services: No  Discharge Living Setting Plans for Discharge Living Setting: Lives with (comment), Apartment (Lives with significant other.) Type of Home at Discharge: Apartment Discharge Home Layout: One level Discharge Home Access: Level entry Does the patient have any problems obtaining your medications?: No  Social/Family/Support Systems Patient Roles: Partner, Other (Comment) (Has a significant other, niece, mother.) Contact Information: Heriberto [place] - SO Anticipated Caregiver: Burt Knack - [phone] Ability/Limitations of Caregiver: SO Herbie [place] does not work and can assist. Careers adviser: 24/7 Discharge Plan Discussed with Primary Caregiver: Yes Is Caregiver In Agreement with Plan?: Yes Does Caregiver/Family have Issues with Lodging/Transportation while Pt is in Rehab?: No  Goals/Additional Needs Patient/Family Goal for Rehab: PT mod I, OT mod I and supervision goals Expected length of stay: 13-15 days Cultural Considerations: None Dietary Needs: Renal diet, thin liquids, fluid restriction 1200 mL/day Equipment Needs: TBD Pt/Family Agrees to Admission and willing to participate: Yes Program Orientation Provided & Reviewed with Pt/Caregiver Including Roles  & Responsibilities: Yes  Decrease burden of Care through IP rehab admission: N/A  Possible need for SNF placement upon discharge: Not planned  Patient Condition: This patient's condition remains as documented in the consult dated 01/25/17, in which the Rehabilitation Physician determined and documented that the patient's condition is appropriate for intensive rehabilitative care in an inpatient rehabilitation facility. Will admit to inpatient rehab tomorrow.  Preadmission Screen Completed By:  [name], 01/26/2017 3:31  PM ______________________________________________________________________   Discussed status with Dr. Naaman Plummer on 01/26/17 at 1531 and received telephone approval for admission tomorrow.  Admission Coordinator:  [name], time 1531/Date 01/26/17

## 2017-01-26 NOTE — Progress Notes (Signed)
PT Cancellation Note  Patient Details Name: Kenneth [name] MRN: [id number] DOB: 23-Sep-1955   Cancelled Treatment:    Reason Eval/Treat Not Completed: Patient at procedure or test/unavailable (HD). Will follow-up for PT treatment as schedule permits.  Mabeline Caras, PT, DPT Acute Rehab Services  Pager: [place] 01/26/2017, 10:15 AM

## 2017-01-26 NOTE — Progress Notes (Signed)
Rehab admissions - I have approval for acute inpatient rehab admission and will have a rehab bed open tomorrow, Saturday.  I will plan to admit to acute inpatient rehab in am.  Call me for questions.  #[phone]

## 2017-01-26 NOTE — Progress Notes (Signed)
[facility] Progress Note   Subjective:   Patient is resting in bed. PT at bedside, about to assist in transfer to chair. No new c/os.   Objective Vitals:   01/26/17 0718 01/26/17 0800 01/26/17 0900 01/26/17 0930  BP: 119/74 115/82 108/70 112/72  Pulse: 94 93 100 97  Resp:      Temp:      TempSrc:      SpO2:      Weight:      Height:       Physical Exam General:NAD, thin BM Heart:tachycardia, regular rhythm Lungs:CTAB, no wheeze or rales Abdomen:soft, NT, ND, +BS Extremities:no edema b/l. Right LE in brace.  Dialysis Access: LU AVF +bruit/thrill    Dialysis: MWF [facility] 4h   65kg   2/2   LUA AVF   Hep 5000 Venofer 100 mg IV x4HD - 1/4 completed Calcitriol 0.55mcg PO x3wk Phoslo  Last Labs PTA: 10/17Hgb 14.5, 10/10: TSAT 23%, K 4.9, Ca 9.2, P 4.1, PTH 574, Alb 3.7  Assessment: 1. Falls/ mult fractures - s/p ORIF right tibial plat fracture + meniscus repair + ant compartment fasciotomy 10/23 by Dr. Marcelino Scot. Non-surgical Rx of other fractures.  Will need rehab 2. ESRD - cont HD MWF 3. Anemia of CKD- Hgb 11's, at goal. follow 4. Secondary hyperparathyroidism - Ca and P in goal. Continue VDRA,  and binder. 5. HTN/volume - BP's low / normal, no extra vol on exam. Under dry wt by 4kg.  6. Nutrition -  Alb 2.6. Renal/Carb modified diet.   7. HIV - well controlled. Home meds.  8. Lupus - on prednisone 10 mg/d and plaquinel   Plan - HD today.  Poss CIR admit.    Kelly Splinter MD [facility] pager [phone]   01/26/2017, 9:57 AM  Filed Weights   01/24/17 1415 01/24/17 1825 01/26/17 0657  Weight: 64.7 kg (142 lb 10.2 oz) 62.4 kg (137 lb 9.1 oz) 60.9 kg (134 lb 4.2 oz)    Intake/Output Summary (Last 24 hours) at 01/26/17 0957 Last data filed at 01/25/17 1757  Gross per 24 hour  Intake              330 ml  Output                0 ml  Net              330 ml    Additional Objective Labs: Basic Metabolic Panel:  Recent Labs Lab  01/24/17 0359 01/25/17 0546 01/26/17 0500  NA 135 135 138  K 5.9* 5.0 4.8  CL 94* 94* 95*  CO2 24 26 28   GLUCOSE 105* 68 121*  BUN 49* 23* 59*  CREATININE 9.91* 6.82* 9.46*  CALCIUM 7.9*  8.2* 8.2* 8.4*  PHOS 4.3 4.3 5.0*   Liver Function Tests:  Recent Labs Lab 01/21/17 2248  01/24/17 0359 01/25/17 0546 01/26/17 0500  AST 28  --   --   --   --   ALT 17  --   --   --   --   ALKPHOS 108  --   --   --   --   BILITOT 0.6  --   --   --   --   PROT 8.1  --   --   --   --   ALBUMIN 3.3*  < > 2.6* 2.6* 2.4*  < > = values in this interval not displayed. CBC:  Recent Labs Lab 01/21/17  2248  01/22/17 1949 01/23/17 0655  01/24/17 0359 01/25/17 0546 01/26/17 0500  WBC 10.7*  < > 11.2* 13.9*  --  17.8* 11.2* 13.0*  NEUTROABS 8.3*  --   --   --   --   --   --   --   HGB 14.2  < > 13.4 13.0  < > 11.3* 11.9* 11.2*  HCT 42.4  < > 40.9 39.3  < > 35.2* 36.5* 35.2*  MCV 100.5*  < > 100.7* 100.8*  --  99.2 99.2 98.6  PLT 168  < > 133* 164  --  155 181 218  < > = values in this interval not displayed. Blood Culture    Component Value Date/Time   SDES BLOOD RIGHT HAND 01/23/2017 0017   SDES BLOOD RIGHT HAND 01/23/2017 0017   SPECREQUEST IN PEDIATRIC BOTTLE Blood Culture adequate volume 01/23/2017 0017   SPECREQUEST IN PEDIATRIC BOTTLE Blood Culture adequate volume 01/23/2017 0017   CULT NO GROWTH 2 DAYS 01/23/2017 0017   CULT NO GROWTH 2 DAYS 01/23/2017 0017   REPTSTATUS PENDING 01/23/2017 0017   REPTSTATUS PENDING 01/23/2017 0017   Studies/Results: No results found.  Medications: . sodium chloride 10 mL/hr at 01/23/17 1619  . sodium chloride    . sodium chloride     . abacavir  600 mg Oral q1800  . amitriptyline  10 mg Oral QHS  . calcitRIOL  0.75 mcg Oral Q M,W,F-HD  . calcium acetate  1,334 mg Oral TID WC  . cholecalciferol  1,000 Units Oral Daily  . dolutegravir  50 mg Oral q1800  . feeding supplement (NEPRO CARB STEADY)  237 mL Oral TID AC  . heparin  5,000  Units Subcutaneous Q8H  . hydroxychloroquine  200 mg Oral BID  . lamiVUDine  25 mg Oral q1800  . multivitamin with minerals  1 tablet Oral Daily  . polyethylene glycol  17 g Oral BID  . predniSONE  10 mg Oral Daily

## 2017-01-26 NOTE — Progress Notes (Signed)
Physical Therapy Treatment Patient Details Name: Kenneth [name] MRN: [id number] DOB: 04/02/1956 Today's Date: 01/26/2017    History of Present Illness Pt is a 61 y.o. male presenting with mechanical fall resulting in right distal clavicle fx, right scapula fx, and right tibia plateau fx; now s/p R tibial plateau ORIF with meniscus repair on 01/23/17. Pertinent PMH includes chronic fatigue, lupus, HIV, history of DVT, dysphagia, HLD, joint swelling.    PT Comments    Today's session focused on RLE therex, including AROM/PROM, stretching, and strengthening; pt with increased difficulty utilizing AAROM techniques with BUEs secondary to RUE pain and ROM limitations. Able to passively flex/abduct R shoulder to ~90', but unable to do this actively. Able to sit EOB and tolerate R knee flexion to grossly 70' in unlocked hinged brace. Pt motivated to continued rehab at [facility]. Will plan to follow acutely.   Follow Up Recommendations  CIR;Supervision for mobility/OOB     Equipment Recommendations  Other (comment) (defer to next venue)    Recommendations for Other Services       Precautions / Restrictions Precautions Precautions: Fall Restrictions Weight Bearing Restrictions: Yes RUE Weight Bearing: Weight bearing as tolerated RLE Weight Bearing: Non weight bearing Other Position/Activity Restrictions: RUE sling for comfort    Mobility  Bed Mobility Overal bed mobility: Needs Assistance Bed Mobility: Supine to Sit;Sit to Supine     Supine to sit: Min assist Sit to supine: Min assist   General bed mobility comments: MinA to assist RLE to EOB; increased time and effort required. Cues for rolling technique with bed flat; increased effort as not able to assist well with RUE  Transfers                    Ambulation/Gait                 Stairs            Wheelchair Mobility    Modified Rankin (Stroke Patients Only)       Balance Overall balance assessment:  Needs assistance Sitting-balance support: Single extremity supported;Feet supported Sitting balance-Leahy Scale: Fair Sitting balance - Comments: Min guard for safety                                    Cognition Arousal/Alertness: Awake/alert Behavior During Therapy: Flat affect Overall Cognitive Status: Within Functional Limits for tasks assessed                                        Exercises General Exercises - Lower Extremity Ankle Circles/Pumps: AROM;Both;5 reps;Supine Quad Sets: AROM;Both;10 reps;Supine (5 sec holds) Heel Slides: AAROM;Right;10 reps;Supine Hip ABduction/ADduction: AROM;Right;10 reps Other Exercises Other Exercises: Hamstring stretch, gastroc stretch (AAROM for pt to assist by holding sheet with LUE; increased difficulty with this secondary to RUE pain), seated knee flexion (pt can tolerate grossly 0-70' PROM)    General Comments        Pertinent Vitals/Pain Pain Assessment: Faces Faces Pain Scale: Hurts little more Pain Location: RLE > R shoulder Pain Descriptors / Indicators: Operative site guarding;Grimacing;Discomfort Pain Intervention(s): Monitored during session;Repositioned    Home Living                      Prior Function  PT Goals (current goals can now be found in the care plan section) Acute Rehab PT Goals Patient Stated Goal: Return to work PT Goal Formulation: With patient Time For Goal Achievement: 02/07/17 Potential to Achieve Goals: Good Progress towards PT goals: Progressing toward goals    Frequency    Min 5X/week      PT Plan Current plan remains appropriate    Co-evaluation              AM-PAC PT "6 Clicks" Daily Activity  Outcome Measure  Difficulty turning over in bed (including adjusting bedclothes, sheets and blankets)?: Unable Difficulty moving from lying on back to sitting on the side of the bed? : Unable Difficulty sitting down on and standing up  from a chair with arms (e.g., wheelchair, bedside commode, etc,.)?: Unable Help needed moving to and from a bed to chair (including a wheelchair)?: A Little Help needed walking in hospital room?: A Lot Help needed climbing 3-5 steps with a railing? : A Lot 6 Click Score: 10    End of Session Equipment Utilized During Treatment: Gait belt Activity Tolerance: Patient tolerated treatment well Patient left: in bed;with call bell/phone within reach;with nursing/sitter in room Nurse Communication: Mobility status PT Visit Diagnosis: Other abnormalities of gait and mobility (R26.89);Pain Pain - Right/Left: Right Pain - part of body: Shoulder;Leg     Time: 4707-6151 PT Time Calculation (min) (ACUTE ONLY): 26 min  Charges:  $Therapeutic Exercise: 8-22 mins $Therapeutic Activity: 8-22 mins                    G Codes:      Mabeline Caras, PT, DPT Acute Rehab Services  Pager: [phone] 01/26/2017, 3:14 PM

## 2017-01-27 ENCOUNTER — Inpatient Hospital Stay (HOSPITAL_COMMUNITY): Payer: BC Managed Care – PPO

## 2017-01-27 ENCOUNTER — Encounter (HOSPITAL_COMMUNITY): Payer: Self-pay

## 2017-01-27 ENCOUNTER — Inpatient Hospital Stay (HOSPITAL_COMMUNITY)
Admission: RE | Admit: 2017-01-27 | Discharge: 2017-02-03 | DRG: 559 | Disposition: A | Discharge status: Home Health | Payer: BC Managed Care – PPO | Source: Intra-hospital | Attending: Physical Medicine & Rehabilitation | Admitting: Physical Medicine & Rehabilitation

## 2017-01-27 DIAGNOSIS — Z21 Asymptomatic human immunodeficiency virus [HIV] infection status: Secondary | ICD-10-CM | POA: Diagnosis present

## 2017-01-27 DIAGNOSIS — D631 Anemia in chronic kidney disease: Secondary | ICD-10-CM | POA: Diagnosis present

## 2017-01-27 DIAGNOSIS — S0181XA Laceration without foreign body of other part of head, initial encounter: Secondary | ICD-10-CM

## 2017-01-27 DIAGNOSIS — I120 Hypertensive chronic kidney disease with stage 5 chronic kidney disease or end stage renal disease: Secondary | ICD-10-CM | POA: Diagnosis present

## 2017-01-27 DIAGNOSIS — Z9889 Other specified postprocedural states: Secondary | ICD-10-CM | POA: Diagnosis not present

## 2017-01-27 DIAGNOSIS — Z992 Dependence on renal dialysis: Secondary | ICD-10-CM | POA: Diagnosis not present

## 2017-01-27 DIAGNOSIS — S42001S Fracture of unspecified part of right clavicle, sequela: Secondary | ICD-10-CM | POA: Diagnosis not present

## 2017-01-27 DIAGNOSIS — N2581 Secondary hyperparathyroidism of renal origin: Secondary | ICD-10-CM | POA: Diagnosis present

## 2017-01-27 DIAGNOSIS — Z7952 Long term (current) use of systemic steroids: Secondary | ICD-10-CM

## 2017-01-27 DIAGNOSIS — Z8711 Personal history of peptic ulcer disease: Secondary | ICD-10-CM | POA: Diagnosis not present

## 2017-01-27 DIAGNOSIS — S42031D Displaced fracture of lateral end of right clavicle, subsequent encounter for fracture with routine healing: Secondary | ICD-10-CM

## 2017-01-27 DIAGNOSIS — K219 Gastro-esophageal reflux disease without esophagitis: Secondary | ICD-10-CM | POA: Diagnosis present

## 2017-01-27 DIAGNOSIS — M7989 Other specified soft tissue disorders: Secondary | ICD-10-CM | POA: Diagnosis not present

## 2017-01-27 DIAGNOSIS — S42101D Fracture of unspecified part of scapula, right shoulder, subsequent encounter for fracture with routine healing: Secondary | ICD-10-CM | POA: Diagnosis not present

## 2017-01-27 DIAGNOSIS — W1830XD Fall on same level, unspecified, subsequent encounter: Secondary | ICD-10-CM | POA: Diagnosis not present

## 2017-01-27 DIAGNOSIS — E875 Hyperkalemia: Secondary | ICD-10-CM | POA: Diagnosis present

## 2017-01-27 DIAGNOSIS — Z800 Family history of malignant neoplasm of digestive organs: Secondary | ICD-10-CM

## 2017-01-27 DIAGNOSIS — S42031S Displaced fracture of lateral end of right clavicle, sequela: Secondary | ICD-10-CM | POA: Diagnosis not present

## 2017-01-27 DIAGNOSIS — S01119A Laceration without foreign body of unspecified eyelid and periocular area, initial encounter: Secondary | ICD-10-CM

## 2017-01-27 DIAGNOSIS — N186 End stage renal disease: Secondary | ICD-10-CM | POA: Diagnosis present

## 2017-01-27 DIAGNOSIS — S01111A Laceration without foreign body of right eyelid and periocular area, initial encounter: Secondary | ICD-10-CM

## 2017-01-27 DIAGNOSIS — Z8249 Family history of ischemic heart disease and other diseases of the circulatory system: Secondary | ICD-10-CM | POA: Diagnosis not present

## 2017-01-27 DIAGNOSIS — D72829 Elevated white blood cell count, unspecified: Secondary | ICD-10-CM | POA: Diagnosis present

## 2017-01-27 DIAGNOSIS — S82141D Displaced bicondylar fracture of right tibia, subsequent encounter for closed fracture with routine healing: Secondary | ICD-10-CM | POA: Diagnosis present

## 2017-01-27 DIAGNOSIS — M3214 Glomerular disease in systemic lupus erythematosus: Secondary | ICD-10-CM | POA: Diagnosis present

## 2017-01-27 DIAGNOSIS — E785 Hyperlipidemia, unspecified: Secondary | ICD-10-CM | POA: Diagnosis present

## 2017-01-27 DIAGNOSIS — S82141S Displaced bicondylar fracture of right tibia, sequela: Secondary | ICD-10-CM

## 2017-01-27 DIAGNOSIS — R52 Pain, unspecified: Secondary | ICD-10-CM

## 2017-01-27 LAB — RENAL FUNCTION PANEL
Albumin: 2.5 g/dL — ABNORMAL LOW (ref 3.5–5.0)
Anion gap: 12 (ref 5–15)
BUN: 31 mg/dL — ABNORMAL HIGH (ref 6–20)
CALCIUM: 8.8 mg/dL — AB (ref 8.9–10.3)
CHLORIDE: 93 mmol/L — AB (ref 101–111)
CO2: 29 mmol/L (ref 22–32)
CREATININE: 7.08 mg/dL — AB (ref 0.61–1.24)
GFR, EST AFRICAN AMERICAN: 9 mL/min — AB (ref >60)
GFR, EST NON AFRICAN AMERICAN: 8 mL/min — AB (ref >60)
Glucose, Bld: 101 mg/dL — ABNORMAL HIGH (ref 65–99)
Phosphorus: 4.8 mg/dL — ABNORMAL HIGH (ref 2.5–4.6)
Potassium: 4.6 mmol/L (ref 3.5–5.1)
SODIUM: 134 mmol/L — AB (ref 135–145)

## 2017-01-27 LAB — CBC
HCT: 35.6 % — ABNORMAL LOW (ref 39.0–52.0)
HEMOGLOBIN: 11.5 g/dL — AB (ref 13.0–17.0)
MCH: 31.9 pg (ref 26.0–34.0)
MCHC: 32.3 g/dL (ref 30.0–36.0)
MCV: 98.9 fL (ref 78.0–100.0)
Platelets: 218 10*3/uL (ref 150–400)
RBC: 3.6 MIL/uL — AB (ref 4.22–5.81)
RDW: 14.5 % (ref 11.5–15.5)
WBC: 12.6 10*3/uL — AB (ref 4.0–10.5)

## 2017-01-27 MED ORDER — DOCUSATE SODIUM 100 MG PO CAPS: 100.0000 mg | ORAL_CAPSULE | Freq: Every day | ORAL | Status: DC | PRN

## 2017-01-27 MED ORDER — PREDNISONE 10 MG PO TABS: 10.0000 mg | ORAL_TABLET | Freq: Every day | ORAL | 0 refills | Status: DC

## 2017-01-27 MED ORDER — HEPARIN SODIUM (PORCINE) 5000 UNIT/ML IJ SOLN: 5000.0000 [IU] | Freq: Three times a day (TID) | INTRAMUSCULAR | 0 refills | Status: DC

## 2017-01-27 MED ORDER — ADULT MULTIVITAMIN W/MINERALS CH: 1.0000 | ORAL_TABLET | Freq: Every day | ORAL | 0 refills | Status: DC

## 2017-01-27 MED ORDER — HYDROCODONE-ACETAMINOPHEN 7.5-325 MG PO TABS: 2.0000 | ORAL_TABLET | ORAL | Status: DC | PRN

## 2017-01-27 MED ORDER — PREDNISONE 10 MG PO TABS
10.0000 mg | ORAL_TABLET | Freq: Every day | ORAL | Status: DC
  Administered 2017-01-28 – 2017-02-03 (×7): 10 mg via ORAL
  Filled 2017-01-27 (×7): qty 1

## 2017-01-27 MED ORDER — HYDROXYCHLOROQUINE SULFATE 200 MG PO TABS: 200.0000 mg | ORAL_TABLET | Freq: Two times a day (BID) | ORAL | 0 refills | Status: DC

## 2017-01-27 MED ORDER — AMITRIPTYLINE HCL 10 MG PO TABS
10.0000 mg | ORAL_TABLET | Freq: Every day | ORAL | Status: DC
  Administered 2017-01-27 – 2017-02-01 (×6): 10 mg via ORAL
  Filled 2017-01-27 (×7): qty 1

## 2017-01-27 MED ORDER — DOLUTEGRAVIR SODIUM 50 MG PO TABS
50.0000 mg | ORAL_TABLET | Freq: Every day | ORAL | Status: DC
  Filled 2017-01-27: qty 1

## 2017-01-27 MED ORDER — HEPARIN SODIUM (PORCINE) 5000 UNIT/ML IJ SOLN
5000.0000 [IU] | Freq: Three times a day (TID) | INTRAMUSCULAR | Status: DC
  Administered 2017-01-27 – 2017-02-03 (×20): 5000 [IU] via SUBCUTANEOUS
  Filled 2017-01-27 (×21): qty 1

## 2017-01-27 MED ORDER — DOLUTEGRAVIR SODIUM 50 MG PO TABS
50.0000 mg | ORAL_TABLET | ORAL | Status: DC
  Administered 2017-01-27 – 2017-02-02 (×6): 50 mg via ORAL
  Filled 2017-01-27 (×8): qty 1

## 2017-01-27 MED ORDER — HYDROCODONE-ACETAMINOPHEN 7.5-325 MG PO TABS: 2.0000 | ORAL_TABLET | ORAL | 0 refills | Status: DC | PRN

## 2017-01-27 MED ORDER — DOCUSATE SODIUM 100 MG PO CAPS: 100.0000 mg | ORAL_CAPSULE | Freq: Every day | ORAL | 0 refills | Status: DC | PRN

## 2017-01-27 MED ORDER — DOLUTEGRAVIR SODIUM 50 MG PO TABS: 50.0000 mg | ORAL_TABLET | Freq: Every day | ORAL | 0 refills | Status: DC

## 2017-01-27 MED ORDER — CALCITRIOL 0.5 MCG PO CAPS
0.7500 ug | ORAL_CAPSULE | ORAL | Status: DC
  Administered 2017-01-29 – 2017-02-03 (×5): 0.75 ug via ORAL
  Filled 2017-01-27 (×3): qty 1

## 2017-01-27 MED ORDER — LAMIVUDINE 10 MG/ML PO SOLN
25.0000 mg | Freq: Every day | ORAL | Status: DC
  Administered 2017-01-27 – 2017-02-02 (×6): 25 mg via ORAL
  Filled 2017-01-27 (×8): qty 5

## 2017-01-27 MED ORDER — ACETAMINOPHEN 650 MG RE SUPP: 650.0000 mg | RECTAL | Status: DC | PRN

## 2017-01-27 MED ORDER — HEPARIN SODIUM (PORCINE) 5000 UNIT/ML IJ SOLN: 5000.0000 [IU] | Freq: Three times a day (TID) | INTRAMUSCULAR | Status: DC

## 2017-01-27 MED ORDER — HYDROCODONE-ACETAMINOPHEN 7.5-325 MG PO TABS: 1.0000 | ORAL_TABLET | ORAL | 0 refills | Status: DC | PRN

## 2017-01-27 MED ORDER — ONDANSETRON HCL 4 MG PO TABS: 4.0000 mg | ORAL_TABLET | Freq: Four times a day (QID) | ORAL | 0 refills | Status: DC | PRN

## 2017-01-27 MED ORDER — METOCLOPRAMIDE HCL 5 MG PO TABS: 5.0000 mg | ORAL_TABLET | Freq: Three times a day (TID) | ORAL | 0 refills | Status: DC | PRN

## 2017-01-27 MED ORDER — POLYETHYLENE GLYCOL 3350 17 G PO PACK: 17.0000 g | PACK | Freq: Two times a day (BID) | ORAL | 0 refills | Status: DC

## 2017-01-27 MED ORDER — ABACAVIR SULFATE 300 MG PO TABS
600.0000 mg | ORAL_TABLET | Freq: Every day | ORAL | Status: DC
  Administered 2017-01-27 – 2017-02-02 (×6): 600 mg via ORAL
  Filled 2017-01-27 (×8): qty 2

## 2017-01-27 MED ORDER — CHOLECALCIFEROL 25 MCG (1000 UT) PO TABS: 1000.0000 [IU] | ORAL_TABLET | Freq: Every day | ORAL | 0 refills | Status: DC

## 2017-01-27 MED ORDER — POLYETHYLENE GLYCOL 3350 17 G PO PACK
17.0000 g | PACK | Freq: Two times a day (BID) | ORAL | Status: DC
  Administered 2017-01-27 – 2017-02-02 (×11): 17 g via ORAL
  Filled 2017-01-27 (×14): qty 1

## 2017-01-27 MED ORDER — NEPRO/CARBSTEADY PO LIQD
237.0000 mL | Freq: Three times a day (TID) | ORAL | Status: DC
  Administered 2017-01-27 – 2017-02-02 (×16): 237 mL via ORAL
  Filled 2017-01-27: qty 237

## 2017-01-27 MED ORDER — VITAMIN D 1000 UNITS PO TABS
1000.0000 [IU] | ORAL_TABLET | Freq: Every day | ORAL | Status: DC
  Administered 2017-01-28 – 2017-02-03 (×7): 1000 [IU] via ORAL
  Filled 2017-01-27 (×7): qty 1

## 2017-01-27 MED ORDER — DOLUTEGRAVIR SODIUM 50 MG PO TABS
50.0000 mg | ORAL_TABLET | Freq: Every day | ORAL | Status: DC
  Administered 2017-01-27: 50 mg via ORAL
  Filled 2017-01-27 (×2): qty 1

## 2017-01-27 MED ORDER — HYDROXYCHLOROQUINE SULFATE 200 MG PO TABS
200.0000 mg | ORAL_TABLET | Freq: Two times a day (BID) | ORAL | Status: DC
  Administered 2017-01-27 – 2017-02-03 (×13): 200 mg via ORAL
  Filled 2017-01-27 (×14): qty 1

## 2017-01-27 MED ORDER — LAMIVUDINE 10 MG/ML PO SOLN: 25.0000 mg | Freq: Every day | ORAL | 12 refills | Status: DC

## 2017-01-27 MED ORDER — ACETAMINOPHEN 325 MG PO TABS: 650.0000 mg | ORAL_TABLET | ORAL | Status: DC | PRN

## 2017-01-27 MED ORDER — ADULT MULTIVITAMIN W/MINERALS CH
1.0000 | ORAL_TABLET | Freq: Every day | ORAL | Status: DC
  Administered 2017-01-28 – 2017-02-03 (×7): 1 via ORAL
  Filled 2017-01-27 (×7): qty 1

## 2017-01-27 MED ORDER — GLYCERIN (LAXATIVE) 2.1 G RE SUPP
1.0000 | Freq: Once | RECTAL | Status: DC
  Filled 2017-01-27: qty 1

## 2017-01-27 MED ORDER — CALCIUM ACETATE (PHOS BINDER) 667 MG PO CAPS
1334.0000 mg | ORAL_CAPSULE | Freq: Three times a day (TID) | ORAL | Status: DC
  Administered 2017-01-27 – 2017-02-03 (×18): 1334 mg via ORAL
  Filled 2017-01-27 (×19): qty 2

## 2017-01-27 MED ORDER — NEPRO/CARBSTEADY PO LIQD: 237.0000 mL | Freq: Three times a day (TID) | ORAL | 0 refills | Status: DC

## 2017-01-27 NOTE — Progress Notes (Signed)
Subjective: 4 Days Post-Op Procedure(s) (LRB): OPEN REDUCTION INTERNAL FIXATION (ORIF) TIBIAL PLATEAU; REPAIR OF LATERAL TIBIAL PLATEAU; ARTHROTOMY WITH MENISCUS REPAIR; ANTERIOR COMPARTMENT FASCIOTOMY (Right) Patient reports pain as 3 on 0-10 scale.    Objective: Vital signs in last 24 hours: Temp:  [97.9 F (36.6 C)-99.5 F (37.5 C)] 99.3 F (37.4 C) (10/27 0644) Pulse Rate:  [96-115] 102 (10/27 0644) Resp:  [17-18] 17 (10/26 2100) BP: (97-134)/(59-88) 110/59 (10/27 0644) SpO2:  [98 %-100 %] 98 % (10/27 0644) Weight:  [59.7 kg (131 lb 9.8 oz)] 59.7 kg (131 lb 9.8 oz) (10/26 1124)  Intake/Output from previous day: 10/26 0701 - 10/27 0700 In: 240 [P.O.:240] Out: 1284  Intake/Output this shift: No intake/output data recorded.   Recent Labs  01/25/17 0546 01/26/17 0500 01/27/17 0550  HGB 11.9* 11.2* 11.5*    Recent Labs  01/26/17 0500 01/27/17 0550  WBC 13.0* 12.6*  RBC 3.57* 3.60*  HCT 35.2* 35.6*  PLT 218 218    Recent Labs  01/26/17 0500 01/27/17 0550  NA 138 134*  K 4.8 4.6  CL 95* 93*  CO2 28 29  BUN 59* 31*  CREATININE 9.46* 7.08*  GLUCOSE 121* 101*  CALCIUM 8.4* 8.8*   No results for input(s): LABPT, INR in the last 72 hours.  ABD soft Neurovascular intact Sensation intact distally Intact pulses distally Dorsiflexion/Plantar flexion intact Incision: dressing C/D/I  Assessment/Plan: 4 Days Post-Op Procedure(s) (LRB): OPEN REDUCTION INTERNAL FIXATION (ORIF) TIBIAL PLATEAU; REPAIR OF LATERAL TIBIAL PLATEAU; ARTHROTOMY WITH MENISCUS REPAIR; ANTERIOR COMPARTMENT FASCIOTOMY (Right)  Active Problems:   Multiple fracture   Closed displaced fracture of lateral end of right clavicle   Closed fracture of right tibial plateau   Fall  Advance diet Up with therapy  Plan is to be transferred to inpatient rehab today.  Patient is in agreement.    Kenneth [name] J 01/27/2017, 8:16 AM

## 2017-01-27 NOTE — Progress Notes (Signed)
[facility] Progress Note  Dialysis: MWF [facility] 4h   65kg   2/2   LUA AVF   Hep 5000 Venofer 100 mg IV x4HD - 1/4 completed Calcitriol 0.55mcg PO x3wk Phoslo   Assessment/ Plan:   1. Falls/ mult fractures - s/p ORIF right tibial plat fracture + meniscus repair + ant compartment fasciotomy 10/23 by Dr. Marcelino Scot. Non-surgical Rx of other fractures.  Will need rehab. 2. ESRD - cont HD MWF; last HD on 10/26 with no issues. Next HD Monday if he's still here. 3. Anemia of CKD- Hgb 11's, at goal. follow 4. Secondary hyperparathyroidism - Ca and P in goal. Continue VDRA,  and binder. 5. HTN/volume - BP's low / normal, no extra vol on exam. Under dry wt by 4kg.  6. Nutrition -  Alb 2.6. Renal/Carb modified diet.   7. HIV - well controlled. Home meds.  8. Lupus - on prednisone 10 mg/d and plaquenil  Subjective:   Denies any f/c/n/v.  Good appetite. No dyspnea or CP.  No events overnight.    Objective:   BP (!) 110/59 (BP Location: Right Arm)   Pulse (!) 102   Temp 99.3 F (37.4 C) (Oral)   Resp 17   Ht 6\' 1"  (1.854 m)   Wt 59.7 kg (131 lb 9.8 oz)   SpO2 98%   BMI 17.36 kg/m   Intake/Output Summary (Last 24 hours) at 01/27/17 0831 Last data filed at 01/26/17 2340  Gross per 24 hour  Intake              240 ml  Output             1284 ml  Net            -1044 ml   Weight change: -1.2 kg (-2 lb 10.3 oz)  Physical Exam: General:NAD, thin BM Heart:tachycardia, regular rhythm Lungs:CTAB, no wheeze or rales Abdomen:soft, NT, ND, +BS Extremities:no edema b/l. Right LE in brace.  Dialysis Access: LU AVF (BBF) + bruit/thrill   Imaging: No results found.  Labs: BMET  Recent Labs Lab 01/21/17 2248 01/22/17 1225 01/23/17 1006 01/24/17 0359 01/25/17 0546 01/26/17 0500 01/27/17 0550  NA 137 139 138 135 135 138 134*  K 4.9 4.9 4.9 5.9* 5.0 4.8 4.6  CL 101 101  --  94* 94* 95* 93*  CO2 25 27  --  24 26 28 29   GLUCOSE 142* 106* 80 105* 68 121* 101*  BUN 26*  32*  --  49* 23* 59* 31*  CREATININE 10.30* 11.47*  --  9.91* 6.82* 9.46* 7.08*  CALCIUM 8.8* 8.4*  --  7.9*  8.2* 8.2* 8.4* 8.8*  PHOS  --  2.4*  --  4.3 4.3 5.0* 4.8*   CBC  Recent Labs Lab 01/21/17 2248  01/24/17 0359 01/25/17 0546 01/26/17 0500 01/27/17 0550  WBC 10.7*  < > 17.8* 11.2* 13.0* 12.6*  NEUTROABS 8.3*  --   --   --   --   --   HGB 14.2  < > 11.3* 11.9* 11.2* 11.5*  HCT 42.4  < > 35.2* 36.5* 35.2* 35.6*  MCV 100.5*  < > 99.2 99.2 98.6 98.9  PLT 168  < > 155 181 218 218  < > = values in this interval not displayed.  Medications:    . abacavir  600 mg Oral q1800  . amitriptyline  10 mg Oral QHS  . calcitRIOL  0.75 mcg Oral Q M,W,F-HD  .  calcium acetate  1,334 mg Oral TID WC  . cholecalciferol  1,000 Units Oral Daily  . dolutegravir  50 mg Oral q1800  . feeding supplement (NEPRO CARB STEADY)  237 mL Oral TID AC  . heparin  5,000 Units Subcutaneous Q8H  . hydroxychloroquine  200 mg Oral BID  . lamiVUDine  25 mg Oral q1800  . multivitamin with minerals  1 tablet Oral Daily  . polyethylene glycol  17 g Oral BID  . predniSONE  10 mg Oral Daily      Otelia Santee, MD 01/27/2017, 8:31 AM

## 2017-01-27 NOTE — Progress Notes (Signed)
Spoke with Dr. Rosalyn Gess this morning about patient not having a bowel movement for a week. Upon assessment patients abdomen is soft, non-distended with bowel sounds. Patient does not complain of any pain. MD ordered a suppository. Patient request he take it at the receiving unit so that he does not have to go during the transfer. Receiving RN notified.

## 2017-01-27 NOTE — H&P (Signed)
Physical Medicine and Rehabilitation Admission H&P       Chief Complaint  Patient presents with  . Fall  : HPI: Kenneth Mahaffy Parkeris a 61 y.o.right handed malewith history of end-stage renal disease with hemodialysis, hypertension, HIV, lupus nephritis. Per chart review patient lives with significant other. 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 One level home with level entry. Presented 01/22/2017 after mechanical fall with brief loss of consciousness. Cranial CT scan negative. CT cervical spine negative. X-rays and imaging revealed right distal clavicle fracture and right scapular fracture as well as right tibia plateau fracture. Underwent ORIF lateral tibial plateau fracture, arthrotomy with meniscus repair, anterior compartment fasciotomy 1023 per Dr. Marcelino Scot. Hospital course pain management. Hemodialysis ongoing as per renal services. Nonweightbearing right lower extremity with hinged knee brace. Weightbearing as tolerated right upper extremity and sling for comfort. Subcutaneous heparin for DVT prophylaxis. Physical and occupational therapy evaluations completed with recommendations of physical medicine rehabilitation consult. Patient was admitted for a comprehensive rehabilitation program  Review of Systems  Constitutional: Negative for chills and fever.  HENT: Negative for hearing loss.   Eyes: Negative for blurred vision and double vision.  Respiratory: Negative for cough.        Occasional shortness of breath with exertion  Cardiovascular: Positive for palpitations and leg swelling.  Gastrointestinal: Positive for constipation. Negative for nausea and vomiting.       GERD  Genitourinary: Negative for hematuria.  Musculoskeletal: Positive for joint pain and myalgias.  Skin: Negative for rash.  Neurological: Negative for seizures.  All other systems reviewed and are negative.      Past Medical History:  Diagnosis Date    . Achalasia   . Candida infection, esophageal ([place]) 08/2010   a. 2012 - noted on EGD.  Marland Kitchen DVT (deep venous thrombosis) ([place]) ~ 04/2014   ?RLE  . Dysphagia 2008   post heller myotomy/toupee fundoplication.    . ESRD (end stage renal disease) on dialysis [facility])    "MWF: [name]" (01/22/2017)  . Fall 01/22/2017   mechanical fall w/multiple fractures/notes 01/22/2017  . GERD (gastroesophageal reflux disease)    hx (01/22/2017)  . Hemorrhoids, internal   . History of blood transfusion 03/2014   "low counts"  . History of gout   . History of stomach ulcers   . HIV infection ([place]) dx ~ 2009   a. 02/13/2014 CD4 = 240 - undetectable viral load.  . Hyperlipidemia   . Hypertension    hx (01/22/2017)  . Insomnia   . Lupus nephritis ([place])   . Pancytopenia ([place])   . Premature ventricular contractions   . Renal abscess   . SLE (systemic lupus erythematosus) ([place]) dx'd 2015        Past Surgical History:  Procedure Laterality Date  . BASCILIC VEIN TRANSPOSITION Left 06/29/2014   Procedure: FIRST STAGE  [place];  Surgeon: Rosetta Posner, MD;  Location: [place];  Service: Vascular;  Laterality: Left;  . BASCILIC VEIN TRANSPOSITION Left 09/11/2014   Procedure: SECOND STAGE BASILIC VEIN TRANSPOSITION LEFT UPPER ARM;  Surgeon: Rosetta Posner, MD;  Location: [place];  Service: Vascular;  Laterality: Left;  . COLONOSCOPY  2008   .  tortuous colon, internal hemorrhoids.  no polyps or diverticulosis.   Marland Kitchen ESOPHAGOGASTRODUODENOSCOPY  08/2010   Dr [name].  08/2010 candida esophagitis, no obvious esophageal stricture. 02/2006 and 05/2006 dilated esophagus, narrowed distal esophagus but no stricture, was empirically  [place] dilated  . ESOPHAGOGASTRODUODENOSCOPY N/A 06/27/2014   Procedure: ESOPHAGOGASTRODUODENOSCOPY (EGD);  Surgeon: [name], MD;  Location[facility] ENDOSCOPY;  Service: Endoscopy;  Laterality: N/A;  . ESOPHAGOGASTRODUODENOSCOPY N/A 07/02/2014    Procedure: ESOPHAGOGASTRODUODENOSCOPY (EGD);  Surgeon: [name], MD;  [facility]COPY;  Service: Endoscopy;  Laterality: N/A;  with botox  . [place]   with toupee fundoplication of HH.   . INSERTION OF DIALYSIS CATHETER Right 06/29/2014   Procedure: INSERTION OF DIALYSIS CATHETER RIGHT IJ;  Surgeon: Rosetta Posner, MD;  Location: [place];  Service: Vascular;  Laterality: Right;  . NEPHRECTOMY Right ~ 2011  . ORIF TIBIA PLATEAU Right 01/23/2017   Procedure: OPEN REDUCTION INTERNAL FIXATION (ORIF) TIBIAL PLATEAU; REPAIR OF LATERAL TIBIAL PLATEAU; ARTHROTOMY WITH MENISCUS REPAIR; ANTERIOR COMPARTMENT FASCIOTOMY;  Surgeon: Altamese Riverwood, MD;  Location: [place];  Service: Orthopedics;  Laterality: Right;        Family History  Problem Relation Age of Onset  . Colon cancer Father        deceased  . Other Mother        s/p pacemaker - alive and well.  . Hypertension Mother   . Other Unknown        5 brothers, 3 sisters - alive and well.   Social History:  reports that he has never smoked. He has never used smokeless tobacco. He reports that he does not drink alcohol or use drugs. Allergies: No Active Allergies       Medications Prior to Admission  Medication Sig Dispense Refill  . abacavir (ZIAGEN) 300 MG tablet Take 2 tablets (600 mg total) by mouth daily. 60 tablet 5  . amitriptyline (ELAVIL) 10 MG tablet Take 10 mg by mouth at bedtime.     . calcium acetate (PHOSLO) 667 MG capsule Take 2 capsules (1,334 mg total) by mouth 3 (three) times daily with meals. 180 capsule 0  . dolutegravir (TIVICAY) 50 MG tablet TAKE 1 TABLET (50 MG TOTAL) BY MOUTH DAILY. 30 tablet 5  . hydroxychloroquine (PLAQUENIL) 200 MG tablet Take 200 mg by mouth 2 (two) times daily.  0  . lamiVUDine (EPIVIR) 10 MG/ML solution Take 5 mLs (50 mg total) by mouth daily. 480 mL 5  . Multiple Vitamin (MULTIVITAMIN WITH MINERALS) TABS tablet Take 1 tablet by mouth daily.    . predniSONE  (DELTASONE) 10 MG tablet Take 10 mg by mouth daily.    Marland Kitchen TIVICAY 50 MG tablet TAKE ONE TABLET (50 MG) BY MOUTH ONCE DAILY. STORE AT ROOM TEMPERATURE. (Patient not taking: Reported on 01/22/2017) 30 tablet 2    Drug Regimen Review Drug regimen was reviewed and remains appropriate with no significant issues identified  Home: Home Living Family/patient expects to be discharged to:: Private residence Living Arrangements: Spouse/significant other Available Help at Discharge: Family, Available 24 hours/day (Partner) Type of Home: Apartment Home Access: Level entry Home Layout: One level Bathroom Shower/Tub: Multimedia programmer: Standard Home Equipment: Civil engineer, contracting - built in, Crutches   Functional History: Prior Function Level of Independence: Independent Comments: 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 Pt able to support himself with BUEs by gripping onto walker Ambulation/Gait Ambulation/Gait assistance: Min assist Ambulation Distance (Feet): 1 Feet Assistive device: Rolling walker (2 wheeled) General Gait Details: MinA to take scooting step sideways on L foot while standing at EOB with RW and cues for technique. Pt with good ability to maintain RLE NWB precautions. Further mobility limited secondary to arrival of transport for HD  ADL: ADL Overall ADL's : Needs  assistance/impaired Eating/Feeding: Independent Grooming: Supervision/safety, Set up, Sitting Upper Body Bathing: Moderate assistance, Sitting Lower Body Bathing: Maximal assistance, Sit to/from stand Upper Body Dressing : Moderate assistance, Sitting Lower Body Dressing: Maximal assistance, Sit to/from stand Toilet Transfer: Minimal assistance, +2 for safety/equipment, Stand-pivot, RW Toilet Transfer Details (indicate cue type and reason): Simulated sit to stand from EOB  Functional mobility during ADLs: Minimal assistance, +2 for safety/equipment, Rolling walker General ADL Comments: Limited evaluation due to transport taking pt to dialysis. Will further assess   Cognition: Cognition Overall Cognitive Status: Within Functional Limits for tasks assessed Orientation Level: Oriented X4 Cognition Arousal/Alertness: Awake/alert Behavior During Therapy: WFL for tasks assessed/performed Overall Cognitive Status: Within Functional Limits for tasks assessed  Physical Exam: Blood pressure 111/65, pulse 98, temperature 98.9 F (37.2 C), temperature source Oral, resp. rate 18, height 6' 1" (1.854 m), weight 62.4 kg (137 lb 9.1 oz), SpO2 100 %. Physical Exam  Vitals reviewed. Constitutional: He appears well-developed and well-nourished. No distress.  HENT:  Head: Normocephalic.  Eyes: EOM are normal.  Neck: Normal range of motion. Neck supple. No thyromegaly present.  Cardiovascular: Normal rate, regular rhythm and normal heart sounds.  Exam reveals no friction rub.   No murmur heard. Respiratory: Effort normal and breath sounds normal. No respiratory distress.  GI: Soft. Bowel sounds are normal. He exhibits no distension. There is no tenderness. There is no rebound.  Skin. Right lower extremity is dressed appropriately tender with shoulder sling in place to right upper extremity Neurological: He is alertand oriented to person, place, and time. No CN abnl RUE and RLE with pain  inhibition. LUE and LLE 4/5 proximal to distal. Sensory function intact in all 4's. Cognitively appropriate Psych: pleasant and cooperative   Lab Results Last 48 Hours        Results for orders placed or performed during the hospital encounter of 01/22/17 (from the past 48 hour(s))  CBC     Status: Abnormal   Collection Time: 01/24/17  3:59 AM  Result Value Ref Range   WBC 17.8 (H) 4.0 - 10.5 K/uL   RBC 3.55 (L) 4.22 - 5.81 MIL/uL   Hemoglobin 11.3 (L) 13.0 - 17.0 g/dL   HCT 35.2 (L) 39.0 - 52.0 %   MCV 99.2 78.0 - 100.0 fL   MCH 31.8 26.0 - 34.0 pg   MCHC 32.1 30.0 - 36.0 g/dL   RDW 14.9 11.5 - 15.5 %   Platelets 155 150 - 400 K/uL  Renal function panel     Status: Abnormal   Collection Time: 01/24/17  3:59 AM  Result Value Ref Range   Sodium 135 135 - 145 mmol/L   Potassium 5.9 (H) 3.5 - 5.1 mmol/L    Comment: NO VISIBLE HEMOLYSIS   Chloride 94 (L) 101 - 111 mmol/L   CO2 24 22 - 32 mmol/L   Glucose, Bld 105 (H) 65 - 99 mg/dL  BUN 49 (H) 6 - 20 mg/dL   Creatinine, Ser 9.91 (H) 0.61 - 1.24 mg/dL   Calcium 7.9 (L) 8.9 - 10.3 mg/dL   Phosphorus 4.3 2.5 - 4.6 mg/dL   Albumin 2.6 (L) 3.5 - 5.0 g/dL   GFR calc non Af Amer 5 (L) >60 mL/min   GFR calc Af Amer 6 (L) >60 mL/min    Comment: (NOTE) The eGFR has been calculated using the CKD EPI equation. This calculation has not been validated in all clinical situations. eGFR's persistently <60 mL/min signify possible Chronic Kidney Disease.    Anion gap 17 (H) 5 - 15  VITAMIN D 25 Hydroxy (Vit-D Deficiency, Fractures)     Status: Abnormal   Collection Time: 01/24/17  3:59 AM  Result Value Ref Range   Vit D, 25-Hydroxy 12.6 (L) 30.0 - 100.0 ng/mL    Comment: (NOTE) Vitamin D deficiency has been defined by the [place] practice guideline as a level of serum 25-OH vitamin D less than 20 ng/mL (1,2). The Endocrine Society went on to further define vitamin  D insufficiency as a level between 21 and 29 ng/mL (2). 1. IOM (Institute of Medicine). 2010. Dietary reference   intakes for calcium and D. [place]: The   [employer]. 2. Holick MF, Binkley Nageezi, Bischoff-Ferrari HA, et al.   Evaluation, treatment, and prevention of vitamin D   deficiency: an Endocrine Society clinical practice   guideline. JCEM. 2011 Jul; 96(7):1911-30. Performed At: [facility] [place], [place] [id number] Lindon Romp MD [id number]   TSH     Status: None   Collection Time: 01/24/17  3:59 AM  Result Value Ref Range   TSH 0.581 0.350 - 4.500 uIU/mL    Comment: Performed by a 3rd Generation assay with a functional sensitivity of <=0.01 uIU/mL.  Prealbumin     Status: None   Collection Time: 01/24/17  3:59 AM  Result Value Ref Range   Prealbumin 22.9 18 - 38 mg/dL  Testosterone     Status: None   Collection Time: 01/24/17  3:59 AM  Result Value Ref Range   Testosterone 282 264 - 916 ng/dL    Comment: (NOTE) Adult male reference interval is based on a population of healthy nonobese males (BMI <30) between 85 and 108 years old. [place], [place] [phone]. PMID: [id number]. Performed At: [facility] [place], [place] [id number] Lindon Romp MD VW:[id number]   Sex hormone binding globulin     Status: None   Collection Time: 01/24/17  3:59 AM  Result Value Ref Range   Sex Hormone Binding 58.9 19.3 - 76.4 nmol/L    Comment: (NOTE) Performed At: [facility] [place], [place] [id number] Lindon Romp MD [id number]   Magnesium     Status: None   Collection Time: 01/24/17  3:59 AM  Result Value Ref Range   Magnesium 2.1 1.7 - 2.4 mg/dL  CBC     Status: Abnormal   Collection Time: 01/25/17  5:46 AM  Result Value Ref Range   WBC 11.2 (H) 4.0 - 10.5 K/uL   RBC 3.68 (L) 4.22 - 5.81 MIL/uL   Hemoglobin 11.9 (L) 13.0 - 17.0 g/dL    HCT 36.5 (L) 39.0 - 52.0 %   MCV 99.2 78.0 - 100.0 fL   MCH 32.3 26.0 - 34.0 pg   MCHC 32.6 30.0 - 36.0 g/dL   RDW 14.8 11.5 -  15.5 %   Platelets 181 150 - 400 K/uL  Renal function panel     Status: Abnormal   Collection Time: 01/25/17  5:46 AM  Result Value Ref Range   Sodium 135 135 - 145 mmol/L   Potassium 5.0 3.5 - 5.1 mmol/L   Chloride 94 (L) 101 - 111 mmol/L   CO2 26 22 - 32 mmol/L   Glucose, Bld 68 65 - 99 mg/dL   BUN 23 (H) 6 - 20 mg/dL   Creatinine, Ser 6.82 (H) 0.61 - 1.24 mg/dL   Calcium 8.2 (L) 8.9 - 10.3 mg/dL   Phosphorus 4.3 2.5 - 4.6 mg/dL   Albumin 2.6 (L) 3.5 - 5.0 g/dL   GFR calc non Af Amer 8 (L) >60 mL/min   GFR calc Af Amer 9 (L) >60 mL/min    Comment: (NOTE) The eGFR has been calculated using the CKD EPI equation. This calculation has not been validated in all clinical situations. eGFR's persistently <60 mL/min signify possible Chronic Kidney Disease.    Anion gap 15 5 - 15      Imaging Results (Last 48 hours)  Dg Tibia/fibula Right  Result Date: 01/23/2017 CLINICAL DATA:  Fracture fixation EXAM: DG C-ARM 61-120 MIN; RIGHT TIBIA AND FIBULA - 2 VIEW COMPARISON:  01/2020 and 01/22/2017 FINDINGS: Depressed fracture of the lateral tibial plateau has been reduced now with normal joint space. Lateral plate and screws in good position. IMPRESSION: ORIF lateral tibial plateau fracture with normal joint space on postoperative images. Electronically Signed   By: Franchot Gallo M.D.   On: 01/23/2017 14:00   Dg Knee Right Port  Result Date: 01/23/2017 CLINICAL DATA:  ORIF right tibia . EXAM: PORTABLE RIGHT KNEE - 1-2 VIEW COMPARISON:  01/23/2017. FINDINGS: ORIF proximal tibia. Hardware intact. Anatomic alignment. Postsurgical changes noted in the soft tissues. IMPRESSION: ORIF proximal right tibia.  Anatomic alignment. Electronically Signed   By: Marcello Moores  Register   On: 01/23/2017 15:06   Dg C-arm 61-120 Min  Result Date:  01/23/2017 CLINICAL DATA:  Fracture fixation EXAM: DG C-ARM 61-120 MIN; RIGHT TIBIA AND FIBULA - 2 VIEW COMPARISON:  01/2020 and 01/22/2017 FINDINGS: Depressed fracture of the lateral tibial plateau has been reduced now with normal joint space. Lateral plate and screws in good position. IMPRESSION: ORIF lateral tibial plateau fracture with normal joint space on postoperative images. Electronically Signed   By: Franchot Gallo M.D.   On: 01/23/2017 14:00        Medical Problem List and Plan: 1.  Decreased functional mobility secondary to fall sustaining right clavicle fracture, right scapular fracture and right tibia fibula fracture-status post ORIF. Nonweightbearing right lower extremity with hinge knee brace. Weightbearing as tolerated right upper extremity with sling for comfort 2.  DVT Prophylaxis/Anticoagulation: Subcutaneous heparin. Check vascular study 3. Pain Management: Elavil 10 mg daily at bedtime, hydrocodone as needed 4. Mood: Provide emotional support 5. Neuropsych: This patient is capable of making decisions on his own behalf. 6. Skin/Wound Care: Routine skin checks 7. Fluids/Electrolytes/Nutrition: Routine INO's with follow-up chemistries 8. ESRD. Continue hemodialysis as per renal services 9. HIV. Continue antivirals 10. Lupus nephritis. Chronic prednisone as well as plaquenil    Post Admission Physician Evaluation: 1. Functional deficits secondary  to polytrauma. 2. Patient is admitted to receive collaborative, interdisciplinary care between the physiatrist, rehab nursing staff, and therapy team. 3. Patient's level of medical complexity and substantial therapy needs in context of that medical necessity cannot be provided at a lesser intensity  of care such as a SNF. 4. Patient has experienced substantial functional loss from his/her baseline which was documented above under the "Functional History" and "Functional Status" headings.  Judging by the patient's diagnosis,  physical exam, and functional history, the patient has potential for functional progress which will result in measurable gains while on inpatient rehab.  These gains will be of substantial and practical use upon discharge  in facilitating mobility and self-care at the household level. 5. Physiatrist will provide 24 hour management of medical needs as well as oversight of the therapy plan/treatment and provide guidance as appropriate regarding the interaction of the two. 6. The Preadmission Screening has been reviewed and patient status is unchanged unless otherwise stated above. 7. 24 hour rehab nursing will assist with bladder management, bowel management, safety, skin/wound care, disease management, medication administration, pain management and patient education  and help integrate therapy concepts, techniques,education, etc. 8. PT will assess and treat for/with: Lower extremity strength, range of motion, stamina, balance, functional mobility, safety, adaptive techniques and equipment, pain mgt, orthopedic precautions, family ed.   Goals are: mod I. 9. OT will assess and treat for/with: ADL's, functional mobility, safety, upper extremity strength, adaptive techniques and equipment, pain mgt, ortho precautions, family ed.   Goals are: mod I to supervision. Therapy may proceed with showering this patient. 10. SLP will assess and treat for/with: n/a.  Goals are: n/a. 11. Case Management and Social Worker will assess and treat for psychological issues and discharge planning. 12. Team conference will be held weekly to assess progress toward goals and to determine barriers to discharge. 13. Patient will receive at least 3 hours of therapy per day at least 5 days per week. 14. ELOS: 13-15 days       15. Prognosis:  excellent     [name], MD, [place] [facility] 01/27/2017  Cathlyn Parsons., PA-C 01/25/2017

## 2017-01-27 NOTE — Progress Notes (Signed)
Attending Brief Progress Note  Patient seen today on rounds. Doing well. Feels ready for discharge to CIR. No complaints.  Of note has not had bowel movement in about a week. Bowel sounds present but hypoactive. Abdomen soft and nontender to palpation, no masses. Is on bowel regimen. Suspect will improve as he is up and more active with rehab. May benefit from enema/suppository in rehab unit if not improved.  Stable for discharge to CIR.

## 2017-01-28 ENCOUNTER — Inpatient Hospital Stay (HOSPITAL_COMMUNITY): Payer: BC Managed Care – PPO

## 2017-01-28 DIAGNOSIS — Z9889 Other specified postprocedural states: Secondary | ICD-10-CM

## 2017-01-28 DIAGNOSIS — M7989 Other specified soft tissue disorders: Secondary | ICD-10-CM

## 2017-01-28 LAB — CULTURE, BLOOD (ROUTINE X 2)
CULTURE: NO GROWTH
Culture: NO GROWTH
SPECIAL REQUESTS: ADEQUATE
Special Requests: ADEQUATE

## 2017-01-28 NOTE — Evaluation (Signed)
Occupational Therapy Assessment and Plan  Patient Details  Name: Kenneth [name] MRN: [id number] Date of Birth: 01/25/1956  OT Diagnosis: muscle weakness (generalized), pain in joint and decreased coordination Rehab Potential: Rehab Potential (ACUTE ONLY): Good ELOS: 7-10   Today's Date: 01/28/2017 OT Individual Time: 0700-0800 OT Individual Time Calculation (min): 60 min     Problem List:  Patient Active Problem List   Diagnosis Date Noted  . Fracture of right tibial plateau, sequela 01/27/2017  . Laceration of eyebrow and forehead   . Pain   . Fall   . Closed displaced fracture of lateral end of right clavicle   . Closed fracture of right tibial plateau   . Multiple fracture 01/22/2017  . Chronic fatigue 03/11/2015  . Exacerbation of systemic lupus ([place]) 02/24/2015  . Depressed mood 02/18/2015  . Unintentional weight loss 02/18/2015  . S/P dialysis catheter insertion ([place])   . Thrombocytopenia ([place])   . Dysphagia   . ESRD (end stage renal disease) ([place])   . Anasarca 06/22/2014  . DVT (deep venous thrombosis) ([place]) 06/22/2014  . PVC's (premature ventricular contractions) 03/17/2014  . Diarrhea   . Abdominal pain   . Protein-calorie malnutrition, severe ([place]) 02/11/2014  . Lupus nephritis ([place])   . Arthralgia of multiple sites, bilateral 02/02/2014  . Joint swelling 02/01/2014  . Bilateral low back pain without sciatica 02/01/2014  . EXTERNAL HEMORRHOIDS WITHOUT MENTION COMP 01/20/2009  . HIV disease ([place]) 07/24/2006  . Hyperlipidemia 05/31/2006  . ERECTILE DYSFUNCTION 05/31/2006  . INSOMNIA NOS 05/31/2006    Past Medical History:  Past Medical History:  Diagnosis Date  . Achalasia   . Candida infection, esophageal ([place]) 08/2010   a. 2012 - noted on EGD.  Marland Kitchen DVT (deep venous thrombosis) ([place]) ~ 04/2014   ?RLE  . Dysphagia 2008   post heller myotomy/toupee fundoplication.    . ESRD (end stage renal disease) on dialysis [facility])    "MWF: [name]" (01/22/2017)   . Fall 01/22/2017   mechanical fall w/multiple fractures/notes 01/22/2017  . GERD (gastroesophageal reflux disease)    hx (01/22/2017)  . Hemorrhoids, internal   . History of blood transfusion 03/2014   "low counts"  . History of gout   . History of stomach ulcers   . HIV infection ([place]) dx ~ 2009   a. 02/13/2014 CD4 = 240 - undetectable viral load.  . Hyperlipidemia   . Hypertension    hx (01/22/2017)  . Insomnia   . Lupus nephritis ([place])   . Pancytopenia ([place])   . Premature ventricular contractions   . Renal abscess   . SLE (systemic lupus erythematosus) ([place]) dx'd 2015   Past Surgical History:  Past Surgical History:  Procedure Laterality Date  . BASCILIC VEIN TRANSPOSITION Left 06/29/2014   Procedure: FIRST STAGE  [place];  Surgeon: Rosetta Posner, MD;  Location: [place];  Service: Vascular;  Laterality: Left;  . BASCILIC VEIN TRANSPOSITION Left 09/11/2014   Procedure: SECOND STAGE BASILIC VEIN TRANSPOSITION LEFT UPPER ARM;  Surgeon: Rosetta Posner, MD;  Location: [place];  Service: Vascular;  Laterality: Left;  . COLONOSCOPY  2008   .  tortuous colon, internal hemorrhoids.  no polyps or diverticulosis.   Marland Kitchen ESOPHAGOGASTRODUODENOSCOPY  08/2010   Dr [name].  08/2010 candida esophagitis, no obvious esophageal stricture. 02/2006 and 05/2006 dilated esophagus, narrowed distal esophagus but no stricture, was empirically Savary dilated  . ESOPHAGOGASTRODUODENOSCOPY N/A 06/27/2014   Procedure: ESOPHAGOGASTRODUODENOSCOPY (EGD);  Surgeon: [name], MD;  Location: [facility];  Service: Endoscopy;  Laterality: N/A;  . ESOPHAGOGASTRODUODENOSCOPY N/A 07/02/2014   Procedure: ESOPHAGOGASTRODUODENOSCOPY (EGD);  Surgeon: [name], MD;  Location: [facility] ENDOSCOPY;  Service: Endoscopy;  Laterality: N/A;  with botox  . [place]   with toupee fundoplication of HH.   . INSERTION OF DIALYSIS CATHETER Right 06/29/2014   Procedure: INSERTION OF DIALYSIS CATHETER RIGHT IJ;   Surgeon: Rosetta Posner, MD;  Location: [place];  Service: Vascular;  Laterality: Right;  . NEPHRECTOMY Right ~ 2011  . ORIF TIBIA PLATEAU Right 01/23/2017   Procedure: OPEN REDUCTION INTERNAL FIXATION (ORIF) TIBIAL PLATEAU; REPAIR OF LATERAL TIBIAL PLATEAU; ARTHROTOMY WITH MENISCUS REPAIR; ANTERIOR COMPARTMENT FASCIOTOMY;  Surgeon: Altamese Holyrood, MD;  Location: [place];  Service: Orthopedics;  Laterality: Right;    Assessment & Plan Clinical Impression:  Kenneth [name] is a 61 y.o. right handed male with history of end-stage renal disease with hemodialysis, hypertension, HIV, lupus nephritis. Per chart review patient lives with significant other. Independent prior to admission works with [employer] and still drives. One level home with level entry. Presented 10 at /22/2018 after mechanical fall with brief loss of consciousness. Cranial CT scan negative. CT cervical spine negative. X-rays and imaging revealed right distal clavicle fracture and right scapular fracture as well as right tibia plateau fracture. Underwent ORIF lateral tibial plateau fracture, arthrotomy with meniscus repair, anterior compartment fasciotomy 1023 per Dr. Marcelino Scot. Hospital course pain management. Hemodialysis ongoing as per renal services. Nonweightbearing right lower extremity with hinged knee brace. Weightbearing as tolerated right upper extremity and sling for comfort. Subcutaneous heparin for DVT prophylaxis. Physical and occupational therapy evaluations completed with recommendations of physical medicine rehabilitation consult. Patient was admitted for a comprehensive rehabilitation program.    Patient currently requires min with basic self-care skills secondary to muscle weakness, decreased cardiorespiratoy endurance and decreased standing balance, decreased balance strategies and difficulty maintaining precautions.  Prior to hospitalization, patient could complete BADL with independent .  Patient will benefit  from skilled intervention to increase independence with basic self-care skills prior to discharge home with care partner.  Anticipate patient will require intermittent supervision and follow up home health.  OT - End of Session Endurance Deficit: Yes OT Assessment Rehab Potential (ACUTE ONLY): Good OT Barriers to Discharge: Weight bearing restrictions OT Patient demonstrates impairments in the following area(s): Balance;Endurance;Pain;Safety OT Basic ADL's Functional Problem(s): Grooming;Bathing;Dressing;Toileting;Other (comment) OT Transfers Functional Problem(s): Toilet;Tub/Shower OT Additional Impairment(s): None OT Plan OT Intensity: Minimum of 1-2 x/day, 45 to 90 minutes OT Frequency: 5 out of 7 days OT Duration/Estimated Length of Stay: 7-10 OT Treatment/Interventions: Balance/vestibular training;Discharge planning;Pain management;Self Care/advanced ADL retraining;Therapeutic Activities;UE/LE Coordination activities;Therapeutic Exercise;Patient/family education;Functional mobility training;Disease mangement/prevention;Community reintegration;DME/adaptive equipment instruction;Psychosocial support;UE/LE Strength taining/ROM;Wheelchair propulsion/positioning OT Self Feeding Anticipated Outcome(s): MOD I OT Basic Self-Care Anticipated Outcome(s): MOD I OT Toileting Anticipated Outcome(s): MOD I toilet; S bathing OT Bathroom Transfers Anticipated Outcome(s): MOD I toilet; S bathing OT Recommendation Patient destination: Home Follow Up Recommendations: Home health OT Equipment Recommended: 3 in 1 bedside comode;Tub/shower seat;To be determined   Skilled Therapeutic Intervention 1;1. No c/o pain. Pt educated on OT role/purpose, NWB precautions, ELOS, CIR and POC. Pt sits EOB to eat breakfast with supervision during education requiring min A to manage RLE to EOB from transition supine>sitting. Pt stand pivot transfer with RW EOB>w/c<>TTB in shower with MIN A and VC for NWB precautions,  safety and pushing up from bed v. Pulling on walker. Pt bathes with brace  covered with trash bag seated utilizing lateral leans to wash buttocks with VC from OT. PT grooms seated in w/c with set up and dons hospital gown as pt does not have clothing. OT dons L sock 2/2 time management. Exited session with pt seated in w/c with call light in reach and all needs met.  OT Evaluation Precautions/Restrictions  Precautions Precautions: Fall Restrictions Weight Bearing Restrictions: Yes RUE Weight Bearing: Weight bearing as tolerated RLE Weight Bearing: Non weight bearing Other Position/Activity Restrictions: RUE sling for comfort Home Living/Prior Functioning Home Living Family/patient expects to be discharged to:: Private residence Living Arrangements: Spouse/significant other Available Help at Discharge: Family, Available 24 hours/day Type of Home: Apartment Home Access: Level entry Home Layout: One level Bathroom Shower/Tub: Multimedia programmer: Standard Bathroom Accessibility: Yes  Lives With: Significant other IADL History Homemaking Responsibilities: Yes Meal Prep Responsibility: Primary Laundry Responsibility: Primary Cleaning Responsibility: Primary Bill Paying/Finance Responsibility: Primary Shopping Responsibility: Primary Current License: Yes Mode of Transportation: Car Occupation: Full time employment Type of Occupation: Agricultural engineer Leisure and Hobbies: travel; go to movies, eat out,  Prior Function Level of Independence: Independent with basic ADLs  Able to Take Stairs?: Yes Driving: Yes Vocation: Full time employment Leisure: Hobbies-yes (Comment) Comments: 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 Enjoys traveling, cooking and amusement parks.  ADL   Vision Baseline Vision/History: Wears glasses Wears Glasses: Reading only Patient Visual Report: No change from baseline Vision Assessment?: No apparent visual deficits Perception   Perception: Within Functional Limits Praxis Praxis: Intact Cognition Overall Cognitive Status: Within Functional Limits for tasks assessed Arousal/Alertness: Awake/alert Orientation Level: Person;Place;Situation Person: Oriented Place: Oriented Situation: Oriented Year: 2018 Month: October Day of Week: Correct Memory: Appears intact Immediate Memory Recall: Sock;Blue;Bed Memory Recall: Blue;Bed Memory Recall Blue: Without Cue Memory Recall Bed: Without Cue Awareness: Appears intact Problem Solving: Appears intact Safety/Judgment: Appears intact Sensation Sensation Light Touch: Appears Intact Proprioception: Appears Intact Coordination Gross Motor Movements are Fluid and Coordinated: No Fine Motor Movements are Fluid and Coordinated: No Motor  Motor Motor - Skilled Clinical Observations: R LE weakness secondary to fx, R shoulder weakness secondary to fx. L LE/UE WFL Mobility  Transfers Transfers: Sit to Stand;Stand to Sit Sit to Stand: 4: Min assist Stand to Sit: 4: Min assist  Trunk/Postural Assessment  Cervical Assessment Cervical Assessment: Within Functional Limits Thoracic Assessment Thoracic Assessment: Exceptions to [facility] (kyphotic) Lumbar Assessment Lumbar Assessment: Exceptions to [facility] (posterior pelvic tilt) Postural Control Postural Control: Within Functional Limits  Balance Balance Balance Assessed: Yes Static Sitting Balance Static Sitting - Level of Assistance: 5: Stand by assistance Dynamic Sitting Balance Dynamic Sitting - Level of Assistance: 5: Stand by assistance Static Standing Balance Static Standing - Level of Assistance: 4: Min assist Dynamic Standing Balance Dynamic Standing - Level of Assistance: 5: Stand by assistance Extremity/Trunk Assessment RUE Assessment RUE Assessment: Exceptions to [facility] (no formal testing 2/2 broken shoulder; decreased strength and ROM) LUE Assessment LUE Assessment: Within Functional Limits   See Function  Navigator for Current Functional Status.   Refer to Care Plan for Long Term Goals  Recommendations for other services: Therapeutic Recreation  Pet therapy and Kitchen group   Discharge Criteria: Patient will be discharged from OT if patient refuses treatment 3 consecutive times without medical reason, if treatment goals not met, if there is a change in medical status, if patient makes no progress towards goals or if patient is discharged from hospital.  The above assessment, treatment plan, treatment alternatives and  goals were discussed and mutually agreed upon: by patient  Tonny Branch 01/28/2017, 12:39 PM

## 2017-01-28 NOTE — Evaluation (Signed)
Physical Therapy Assessment and Plan  Patient Details  Name: TRAYTON SZABO MRN: [id number] Date of Birth: [birth year]  PT Diagnosis: Difficulty walking, Muscle weakness and Pain in L LE/L shoulder Rehab Potential: Good ELOS: 7-9 days   Today's Date: 01/28/2017 PT Individual Time: 0900-1000 PT Individual Time Calculation (min): 60 min    Problem List:  Patient Active Problem List   Diagnosis Date Noted  . Fracture of right tibial plateau, sequela 01/27/2017  . Laceration of eyebrow and forehead   . Pain   . Fall   . Closed displaced fracture of lateral end of right clavicle   . Closed fracture of right tibial plateau   . Multiple fracture 01/22/2017  . Chronic fatigue 03/11/2015  . Exacerbation of systemic lupus ([place]) 02/24/2015  . Depressed mood 02/18/2015  . Unintentional weight loss 02/18/2015  . S/P dialysis catheter insertion ([place])   . Thrombocytopenia ([place])   . Dysphagia   . ESRD (end stage renal disease) ([place])   . Anasarca 06/22/2014  . DVT (deep venous thrombosis) ([place]) 06/22/2014  . PVC's (premature ventricular contractions) 03/17/2014  . Diarrhea   . Abdominal pain   . Protein-calorie malnutrition, severe ([place]) 02/11/2014  . Lupus nephritis ([place])   . Arthralgia of multiple sites, bilateral 02/02/2014  . Joint swelling 02/01/2014  . Bilateral low back pain without sciatica 02/01/2014  . EXTERNAL HEMORRHOIDS WITHOUT MENTION COMP 01/20/2009  . HIV disease ([place]) 07/24/2006  . Hyperlipidemia 05/31/2006  . ERECTILE DYSFUNCTION 05/31/2006  . INSOMNIA NOS 05/31/2006    Past Medical History:  Past Medical History:  Diagnosis Date  . Achalasia   . Candida infection, esophageal ([place]) 08/2010   a. 2012 - noted on EGD.  Marland Kitchen DVT (deep venous thrombosis) ([place]) ~ 04/2014   ?RLE  . Dysphagia 2008   post heller myotomy/toupee fundoplication.    . ESRD (end stage renal disease) on dialysis [facility])    "MWF: [name]" (01/22/2017)  . Fall 01/22/2017   mechanical fall  w/multiple fractures/notes 01/22/2017  . GERD (gastroesophageal reflux disease)    hx (01/22/2017)  . Hemorrhoids, internal   . History of blood transfusion 03/2014   "low counts"  . History of gout   . History of stomach ulcers   . HIV infection ([place]) dx ~ 2009   a. 02/13/2014 CD4 = 240 - undetectable viral load.  . Hyperlipidemia   . Hypertension    hx (01/22/2017)  . Insomnia   . Lupus nephritis ([place])   . Pancytopenia ([place])   . Premature ventricular contractions   . Renal abscess   . SLE (systemic lupus erythematosus) ([place]) dx'd 2015   Past Surgical History:  Past Surgical History:  Procedure Laterality Date  . BASCILIC VEIN TRANSPOSITION Left 06/29/2014   Procedure: FIRST STAGE  [place];  Surgeon: Rosetta Posner, MD;  Location: [facility];  Service: Vascular;  Laterality: Left;  . BASCILIC VEIN TRANSPOSITION Left 09/11/2014   Procedure: SECOND STAGE BASILIC VEIN TRANSPOSITION LEFT UPPER ARM;  Surgeon: Rosetta Posner, MD;  Location: [facility];  Service: Vascular;  Laterality: Left;  . COLONOSCOPY  2008   .  tortuous colon, internal hemorrhoids.  no polyps or diverticulosis.   Marland Kitchen ESOPHAGOGASTRODUODENOSCOPY  08/2010   Dr [name].  08/2010 candida esophagitis, no obvious esophageal stricture. 02/2006 and 05/2006 dilated esophagus, narrowed distal esophagus but no stricture, was empirically Savary dilated  . ESOPHAGOGASTRODUODENOSCOPY N/A 06/27/2014   Procedure: ESOPHAGOGASTRODUODENOSCOPY (EGD);  Surgeon: [name], MD;  [facility]  ENDOSCOPY;  Service: Endoscopy;  Laterality: N/A;  . ESOPHAGOGASTRODUODENOSCOPY N/A 07/02/2014   Procedure: ESOPHAGOGASTRODUODENOSCOPY (EGD);  Surgeon: [name], MD;  Location: [facility] ENDOSCOPY;  Service: Endoscopy;  Laterality: N/A;  with botox  . [place]   with toupee fundoplication of HH.   . INSERTION OF DIALYSIS CATHETER Right 06/29/2014   Procedure: INSERTION OF DIALYSIS CATHETER RIGHT IJ;  Surgeon: Rosetta Posner, MD;  Location: [place];   Service: Vascular;  Laterality: Right;  . NEPHRECTOMY Right ~ 2011  . ORIF TIBIA PLATEAU Right 01/23/2017   Procedure: OPEN REDUCTION INTERNAL FIXATION (ORIF) TIBIAL PLATEAU; REPAIR OF LATERAL TIBIAL PLATEAU; ARTHROTOMY WITH MENISCUS REPAIR; ANTERIOR COMPARTMENT FASCIOTOMY;  Surgeon: Altamese , MD;  Location: [place];  Service: Orthopedics;  Laterality: Right;    Assessment & Plan Clinical Impression: Patient is a 61 y.o. year old male with history of end-stage renal disease with hemodialysis, hypertension, HIV, lupus nephritis. Per chart review patient lives with significant other. 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 One level home with level entry. Presented 01/22/2017 after mechanical fall with brief loss of consciousness. Cranial CT scan negative. CT cervical spine negative. X-rays and imaging revealed right distal clavicle fracture and right scapular fracture as well as right tibia plateau fracture. Underwent ORIF lateral tibial plateau fracture, arthrotomy with meniscus repair, anterior compartment fasciotomy 1023 per Dr. Marcelino Scot. Hospital course pain management. Hemodialysis ongoing as per renal services. Nonweightbearing right lower extremity with hinged knee brace. Weightbearing as tolerated right upper extremity and sling for comfort. Subcutaneous heparin for DVT prophylaxis. Physical and occupational therapy evaluations completed with recommendations of physical medicine rehabilitation consult. Patient transferred to CIR on 01/27/2017 .   Patient currently requires min with mobility secondary to muscle weakness and muscle joint tightness and decreased standing balance and decreased balance strategies.  Prior to hospitalization, patient was independent  with mobility and lived with Significant other in a [place] home.  Home access is  Level entry.  Patient will benefit from skilled PT intervention to maximize safe functional mobility,  minimize fall risk and decrease caregiver burden for planned discharge home with intermittent assist.  Anticipate patient will benefit from follow up [facility] at discharge.  PT - End of Session Activity Tolerance: Decreased this session;Tolerates 30+ min activity with multiple rests Endurance Deficit: Yes PT Assessment Rehab Potential (ACUTE/IP ONLY): Good PT Barriers to Discharge: Weight bearing restrictions PT Patient demonstrates impairments in the following area(s): Balance;Behavior;Edema;Endurance;Motor;Pain;Sensory;Skin Integrity PT Transfers Functional Problem(s): Bed Mobility;Bed to Chair;Car;Furniture;Floor PT Locomotion Functional Problem(s): Ambulation;Wheelchair Mobility;Stairs PT Plan PT Intensity: Minimum of 1-2 x/day ,45 to 90 minutes PT Frequency: 5 out of 7 days PT Duration Estimated Length of Stay: 7-9 days PT Treatment/Interventions: Ambulation/gait training;Balance/vestibular training;Discharge planning;DME/adaptive equipment instruction;Pain management;Splinting/orthotics;Stair training;Patient/family education;UE/LE Coordination activities;UE/LE Strength taining/ROM;Wheelchair propulsion/positioning;Therapeutic Exercise;Neuromuscular re-education;Therapeutic Activities;Functional mobility training PT Transfers Anticipated Outcome(s): Mod I PT Locomotion Anticipated Outcome(s): supervision PT Recommendation Follow Up Recommendations: Home health PT Patient destination: Home Equipment Recommended: Wheelchair (measurements)  Skilled Therapeutic Intervention Evaluation completed (see details above and below) with education on PT POC and goals and individual treatment initiated with focus on functional mobility and precautions. Pt able to verbalize weightbearing precautions. Pt propelled w/c x 50 ft with min assist, difficulty using R UE  secondary to clavicle fx, pain and tightness. Pt performed w/c<>bed and w/c<>car transfer with min assist using RW for UE support, stand pivot on  L LE while maintaining R LE NWB. Pt ambulated x  10 ft using RW for UE support, hopping and pivoting on L LE while maintaining NWB with R LE, min assist. Pt performed bed mobility with min assist in order to help lift/lower R LE. Pt reported no pain at rest and up to 7/10 pain during therapy with transfers/ambulation, relieved with rest. Pt left supine in bed at end of session with needs in reach.    PT Evaluation Precautions/Restrictions Precautions Precautions: Fall Restrictions Weight Bearing Restrictions: (P) Yes RUE Weight Bearing: (P) Weight bearing as tolerated RLE Weight Bearing: (P) Non weight bearing Other Position/Activity Restrictions: RUE sling for comfort Pain Pain Assessment Pain Assessment: No/denies pain Home Living/Prior Functioning Home Living Available Help at Discharge: Family;Available 24 hours/day Type of Home: Apartment Home Access: Level entry Home Layout: One level Bathroom Shower/Tub: Multimedia programmer: Standard Bathroom Accessibility: Yes  Lives With: Significant other Prior Function Level of Independence: Independent with basic ADLs  Able to Take Stairs?: Yes Driving: Yes Vocation: Full time employment Leisure: Hobbies-yes (Comment) Comments: 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 Enjoys traveling, cooking and amusement parks.  Cognition Overall Cognitive Status: Within Functional Limits for tasks assessed Arousal/Alertness: Awake/alert Orientation Level: (P) Oriented X4 Memory: Appears intact Awareness: Appears intact Problem Solving: Appears intact Safety/Judgment: Appears intact Sensation Sensation Light Touch: Appears Intact (intact L LE, unable to formally assess R LE secondary to ace wrapping and brace, pt reports some numbness.) Proprioception: Appears Intact Coordination Gross Motor Movements are Fluid and Coordinated: No (secondary to R LE hinged brace, pain and NWB) Fine Motor Movements are Fluid and  Coordinated: No (secondary to R LE hinged brace, pain and NWB) Motor  Motor Motor - Skilled Clinical Observations: R LE weakness secondary to fx, R shoulder weakness secondary to fx. L LE/UE [facility]  Trunk/Postural Assessment  Cervical Assessment Cervical Assessment: Within Functional Limits Thoracic Assessment Thoracic Assessment: Exceptions to [facility] (kyphotic) Lumbar Assessment Lumbar Assessment: Exceptions to [facility] (posterior pelvic tilt) Postural Control Postural Control: Within Functional Limits  Balance Balance Balance Assessed: Yes Static Sitting Balance Static Sitting - Level of Assistance: 5: Stand by assistance Dynamic Sitting Balance Dynamic Sitting - Level of Assistance: 5: Stand by assistance Static Standing Balance Static Standing - Level of Assistance: 4: Min assist Dynamic Standing Balance Dynamic Standing - Level of Assistance: 4: Min assist Extremity Assessment  RLE Assessment RLE Assessment: Exceptions to [facility] (knee extension ROM WFL, Knee flexion PROM 100 degrees, AROM 75 degrees. strength grossly 3-/5 throughout) LLE Assessment LLE Assessment: Within Functional Limits   See Function Navigator for Current Functional Status.   Refer to Care Plan for Long Term Goals  Recommendations for other services: None   Discharge Criteria: Patient will be discharged from PT if patient refuses treatment 3 consecutive times without medical reason, if treatment goals not met, if there is a change in medical status, if patient makes no progress towards goals or if patient is discharged from hospital.  The above assessment, treatment plan, treatment alternatives and goals were discussed and mutually agreed upon: by patient  Netta Corrigan, PT, DPT 01/28/2017, 11:00 AM

## 2017-01-28 NOTE — Progress Notes (Signed)
[facility] PHYSICAL MEDICINE & REHABILITATION     PROGRESS NOTE    Subjective/Complaints: No major problems overnight. Denies pain. Just finished up with OT when I arrived  ROS: pt denies nausea, vomiting, diarrhea, cough, shortness of breath or chest pain   Objective: Vital Signs: Blood pressure 117/68, pulse 95, temperature 98.4 F (36.9 C), temperature source Oral, resp. rate 18, height 6\' 1"  (1.854 m), weight 60.5 kg (133 lb 5.4 oz), SpO2 99 %. No results found.  Recent Labs  01/26/17 0500 01/27/17 0550  WBC 13.0* 12.6*  HGB 11.2* 11.5*  HCT 35.2* 35.6*  PLT 218 218    Recent Labs  01/26/17 0500 01/27/17 0550  NA 138 134*  K 4.8 4.6  CL 95* 93*  GLUCOSE 121* 101*  BUN 59* 31*  CREATININE 9.46* 7.08*  CALCIUM 8.4* 8.8*   CBG (last 3)   Recent Labs  01/26/17 1709  GLUCAP 102*    Wt Readings from Last 3 Encounters:  01/28/17 60.5 kg (133 lb 5.4 oz)  01/26/17 59.7 kg (131 lb 9.8 oz)  07/05/16 66.9 kg (147 lb 6.4 oz)    Physical Exam:  Constitutional: He appears well-developedand well-nourished. No distress.  HENT:  Head: Normocephalic.  Eyes: EOMare normal.  Neck: Normal range of motion. Neck supple. No thyromegalypresent.  Cardiovascular: RRR without murmur. No JVD  Respiratory: CTA Bilaterally without wheezes or rales. Normal effort   GI: Soft. Bowel sounds are normal. He exhibits no distension. There is no tenderness. There is no rebound.  Skin. Right lower extremity dressed.  Musc: RLE and RLE tender with basic movement/ROM Neurological: He is alertand oriented to person, place, and time. No CN abnl RUE and RLE with pain inhibition. LUE and LLE 4/5 proximal to distal. Sensory function intact in all 4's. Cognitively intact Psych: flat but appropriate  Assessment/Plan: 1. Decreased functional mobility secondary to polytrauma which require 3+ hours per day of interdisciplinary therapy in a comprehensive inpatient rehab setting. Physiatrist is  providing close team supervision and 24 hour management of active medical problems listed below. Physiatrist and rehab team continue to assess barriers to discharge/monitor patient progress toward functional and medical goals.  Function:  Bathing Bathing position      Bathing parts      Bathing assist        Upper Body Dressing/Undressing Upper body dressing                    Upper body assist        Lower Body Dressing/Undressing Lower body dressing                                  Lower body assist        Toileting Toileting          Toileting assist     Transfers Chair/bed transfer             Locomotion Ambulation           Wheelchair          Cognition Comprehension    Expression    Social Interaction    Problem Solving    Memory     Medical Problem List and Plan: 1. Decreased functional mobilitysecondary to fall sustaining right clavicle fracture, right scapular fracture and right tibia fibula fracture-status post ORIF. Nonweightbearing right lower extremity with hinge knee brace. Weightbearing as tolerated right upper  extremity with sling for comfort  -beginning therapies today 2. DVT Prophylaxis/Anticoagulation: Subcutaneous heparin. Check vascular study 3. Pain Management: Elavil 10 mg daily at bedtime, hydrocodone as needed 4. Mood: Provide emotional support 5. Neuropsych: This patient iscapable of making decisions on hisown behalf. 6. Skin/Wound Care: Routine skin checks 7. Fluids/Electrolytes/Nutrition: Routine INO's with follow-up chemistries 8.ESRD. Continue hemodialysis as per renal services 9.HIV. 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

## 2017-01-28 NOTE — Progress Notes (Signed)
[facility] Progress Note   Subjective: Seen in room, now in inpatient rehab. No CP or dyspnea. Feels "same."    Objective Vitals:   01/27/17 1320 01/27/17 1345 01/28/17 0543  BP:  107/65 117/68  Pulse:  (!) 102 95  Resp:  18 18  Temp:  98.3 F (36.8 C) 98.4 F (36.9 C)  TempSrc:  Oral Oral  SpO2:  98% 99%  Weight: 59.7 kg (131 lb 9.8 oz) 60.8 kg (134 lb) 60.5 kg (133 lb 5.4 oz)  Height: 6\' 1"  (1.854 m) 6\' 1"  (1.854 m)    Physical Exam General: Well appearing male, NAD.  Heart: RRR; no murmur Lungs: CTAB Extremities: No LLE edema, R large in large brace. R arm in sling.  Dialysis Access: L AVG + bruit  Additional Objective Labs: Basic Metabolic Panel:  Recent Labs Lab 01/25/17 0546 01/26/17 0500 01/27/17 0550  NA 135 138 134*  K 5.0 4.8 4.6  CL 94* 95* 93*  CO2 26 28 29   GLUCOSE 68 121* 101*  BUN 23* 59* 31*  CREATININE 6.82* 9.46* 7.08*  CALCIUM 8.2* 8.4* 8.8*  PHOS 4.3 5.0* 4.8*   Liver Function Tests:  Recent Labs Lab 01/21/17 2248  01/25/17 0546 01/26/17 0500 01/27/17 0550  AST 28  --   --   --   --   ALT 17  --   --   --   --   ALKPHOS 108  --   --   --   --   BILITOT 0.6  --   --   --   --   PROT 8.1  --   --   --   --   ALBUMIN 3.3*  < > 2.6* 2.4* 2.5*  < > = values in this interval not displayed.  CBC:  Recent Labs Lab 01/21/17 2248  01/23/17 0655  01/24/17 0359 01/25/17 0546 01/26/17 0500 01/27/17 0550  WBC 10.7*  < > 13.9*  --  17.8* 11.2* 13.0* 12.6*  NEUTROABS 8.3*  --   --   --   --   --   --   --   HGB 14.2  < > 13.0  < > 11.3* 11.9* 11.2* 11.5*  HCT 42.4  < > 39.3  < > 35.2* 36.5* 35.2* 35.6*  MCV 100.5*  < > 100.8*  --  99.2 99.2 98.6 98.9  PLT 168  < > 164  --  155 181 218 218  < > = values in this interval not displayed.  CBG:  Recent Labs Lab 01/26/17 1709  GLUCAP 102*   Medications:  . abacavir  600 mg Oral q1800  . amitriptyline  10 mg Oral QHS  . [START ON 01/29/2017] calcitRIOL  0.75 mcg  Oral Q M,W,F-HD  . calcium acetate  1,334 mg Oral TID WC  . cholecalciferol  1,000 Units Oral Daily  . dolutegravir  50 mg Oral Q24H  . feeding supplement (NEPRO CARB STEADY)  237 mL Oral TID AC  . heparin subcutaneous  5,000 Units Subcutaneous Q8H  . hydroxychloroquine  200 mg Oral BID  . lamiVUDine  25 mg Oral q1800  . multivitamin with minerals  1 tablet Oral Daily  . polyethylene glycol  17 g Oral BID  . predniSONE  10 mg Oral Daily    Dialysis Orders: MWF [facility] 4h 65kg 2/2 LUA AVF Hep 5000 Venofer 100 mg IV x4HD - 1/4 completed Calcitriol 0.48mcg PO x3wk Phoslo  Assessment/Plan: 1. Hx Fall/multiple  fractures: S/p ORIF right tibial plat fracture + meniscus repair + ant compartment fasciotomy 10/23 by Dr. Marcelino Scot. Non-surgical Rx of other fractures. CIR now. 2. ESRD: Cont HD MWF, next 10/29. 3. Anemia of CKD: Hgb 11's, atgoal. Follow, no ESA yet. 4. Secondary hyperparathyroidism - Ca and P in goal. Continue VDRAand binder. 5. HTN/volume: BP stable. no extra vol on exam. Under dry wt by 4kg.  6. Nutrition: Alb 2.6, continue Nepro supps. 7. HIV: Well controlled on home meds. 8. Lupus: On prednisone 10 mg/d and plaquenil  Veneta Penton, PA-C 01/28/2017, 10:07 AM  [facility] Pager: [phone]  Patient seen and examined. Agree with PA A/P. Transferred to inpt rehab and doing well. Good appetite and denies f/c/n/v/dyspnea. Next HD tomorrow. No acute indication for HD today.

## 2017-01-29 ENCOUNTER — Inpatient Hospital Stay (HOSPITAL_COMMUNITY): Payer: BC Managed Care – PPO | Admitting: Physical Therapy

## 2017-01-29 ENCOUNTER — Inpatient Hospital Stay (HOSPITAL_COMMUNITY): Payer: BC Managed Care – PPO

## 2017-01-29 ENCOUNTER — Inpatient Hospital Stay (HOSPITAL_COMMUNITY): Payer: BC Managed Care – PPO | Admitting: Occupational Therapy

## 2017-01-29 DIAGNOSIS — S42031S Displaced fracture of lateral end of right clavicle, sequela: Secondary | ICD-10-CM

## 2017-01-29 LAB — TESTOSTERONE, % FREE: Testosterone-% Free: 0.8 % — ABNORMAL LOW (ref 0.2–0.7)

## 2017-01-29 NOTE — Progress Notes (Signed)
Physical Therapy Session Note  Patient Details  Name: Kenneth [name] MRN: [id number] Date of Birth: July 06, 1955  Today's Date: 01/29/2017 PT Individual Time: 1400-1430 PT Individual Time Calculation (min): 30 min   Short Term Goals: Week 1:  PT Short Term Goal 1 (Week 1): STG=LTG due to ELOS  Skilled Therapeutic Interventions/Progress Updates:    no c/o pain.  Session focus on d/c planning, gait with PRW, and w/c propulsion.  PT and pt discussed goals of care, home accessibility, and w/c versus ambulatory level goals at discharge.  Discussed benefits/cons of each and plan for continued practice with both w/c mobility and gait throughout LOS. Sit<>stand from w/c with steady assist and increased time.  Gait training x6' with PRW and min guard, significantly increased time, and mod cues to maintain NWB on RLE.  Pt propelled w/c back to room at end of session, positioned in bed with min assist.  Call bell in reach and needs met.   Therapy Documentation Precautions:  Precautions Precautions: Fall Restrictions Weight Bearing Restrictions: Yes RUE Weight Bearing: Weight bearing as tolerated RLE Weight Bearing: Non weight bearing Other Position/Activity Restrictions: RUE sling for comfort   See Function Navigator for Current Functional Status.   Therapy/Group: Individual Therapy  Michel Santee 01/29/2017, 4:25 PM

## 2017-01-29 NOTE — Progress Notes (Signed)
Patient information reviewed and entered into eRehab system by Jarrell Armond, RN, CRRN, PPS Coordinator.  Information including medical coding and functional independence measure will be reviewed and updated through discharge.    

## 2017-01-29 NOTE — Progress Notes (Signed)
Social Work Assessment and Plan Social Work Assessment and Plan  Patient Details  Name: Kenneth [name] MRN: [id number] Date of Birth: 17-Feb-1956  Today's Date: 01/29/2017  Problem List:  Patient Active Problem List   Diagnosis Date Noted  . Fracture of right tibial plateau, sequela 01/27/2017  . Laceration of eyebrow and forehead   . Pain   . Fall   . Closed displaced fracture of lateral end of right clavicle   . Closed fracture of right tibial plateau   . Multiple fracture 01/22/2017  . Chronic fatigue 03/11/2015  . Exacerbation of systemic lupus ([place]) 02/24/2015  . Depressed mood 02/18/2015  . Unintentional weight loss 02/18/2015  . S/P dialysis catheter insertion ([place])   . Thrombocytopenia ([place])   . Dysphagia   . ESRD (end stage renal disease) ([place])   . Anasarca 06/22/2014  . DVT (deep venous thrombosis) ([place]) 06/22/2014  . PVC's (premature ventricular contractions) 03/17/2014  . Diarrhea   . Abdominal pain   . Protein-calorie malnutrition, severe ([place]) 02/11/2014  . Lupus nephritis ([place])   . Arthralgia of multiple sites, bilateral 02/02/2014  . Joint swelling 02/01/2014  . Bilateral low back pain without sciatica 02/01/2014  . EXTERNAL HEMORRHOIDS WITHOUT MENTION COMP 01/20/2009  . HIV disease ([place]) 07/24/2006  . Hyperlipidemia 05/31/2006  . ERECTILE DYSFUNCTION 05/31/2006  . INSOMNIA NOS 05/31/2006   Past Medical History:  Past Medical History:  Diagnosis Date  . Achalasia   . Candida infection, esophageal ([place]) 08/2010   a. 2012 - noted on EGD.  Marland Kitchen DVT (deep venous thrombosis) ([place]) ~ 04/2014   ?RLE  . Dysphagia 2008   post heller myotomy/toupee fundoplication.    . ESRD (end stage renal disease) on dialysis [facility])    "MWF: [name]" (01/22/2017)  . Fall 01/22/2017   mechanical fall w/multiple fractures/notes 01/22/2017  . GERD (gastroesophageal reflux disease)    hx (01/22/2017)  . Hemorrhoids, internal   . History of blood transfusion 03/2014   "low counts"  . History of gout   . History of stomach ulcers   . HIV infection ([place]) dx ~ 2009   a. 02/13/2014 CD4 = 240 - undetectable viral load.  . Hyperlipidemia   . Hypertension    hx (01/22/2017)  . Insomnia   . Lupus nephritis ([place])   . Pancytopenia ([place])   . Premature ventricular contractions   . Renal abscess   . SLE (systemic lupus erythematosus) ([place]) dx'd 2015   Past Surgical History:  Past Surgical History:  Procedure Laterality Date  . BASCILIC VEIN TRANSPOSITION Left 06/29/2014   Procedure: FIRST STAGE  [place];  Surgeon: Rosetta Posner, MD;  Location: [place];  Service: Vascular;  Laterality: Left;  . BASCILIC VEIN TRANSPOSITION Left 09/11/2014   Procedure: SECOND STAGE BASILIC VEIN TRANSPOSITION LEFT UPPER ARM;  Surgeon: Rosetta Posner, MD;  Location: [place];  Service: Vascular;  Laterality: Left;  . COLONOSCOPY  2008   .  tortuous colon, internal hemorrhoids.  no polyps or diverticulosis.   Marland Kitchen ESOPHAGOGASTRODUODENOSCOPY  08/2010   Dr [name].  08/2010 candida esophagitis, no obvious esophageal stricture. 02/2006 and 05/2006 dilated esophagus, narrowed distal esophagus but no stricture, was empirically Savary dilated  . ESOPHAGOGASTRODUODENOSCOPY N/A 06/27/2014   Procedure: ESOPHAGOGASTRODUODENOSCOPY (EGD);  Surgeon: [name], MD;  Location: [facility] ENDOSCOPY;  Service: Endoscopy;  Laterality: N/A;  . ESOPHAGOGASTRODUODENOSCOPY N/A 07/02/2014   Procedure: ESOPHAGOGASTRODUODENOSCOPY (EGD);  Surgeon: [name], MD;  Location: [facility] ENDOSCOPY;  Service: Endoscopy;  Laterality: N/A;  with botox  . [place]   with toupee fundoplication of HH.   . INSERTION OF DIALYSIS CATHETER Right 06/29/2014   Procedure: INSERTION OF DIALYSIS CATHETER RIGHT IJ;  Surgeon: Rosetta Posner, MD;  Location: [place];  Service: Vascular;  Laterality: Right;  . NEPHRECTOMY Right ~ 2011  . ORIF TIBIA PLATEAU Right 01/23/2017   Procedure: OPEN REDUCTION INTERNAL FIXATION (ORIF) TIBIAL  PLATEAU; REPAIR OF LATERAL TIBIAL PLATEAU; ARTHROTOMY WITH MENISCUS REPAIR; ANTERIOR COMPARTMENT FASCIOTOMY;  Surgeon: Altamese [place], MD;  Location: [place];  Service: Orthopedics;  Laterality: Right;   Social History:  reports that he has never smoked. He has never used smokeless tobacco. He reports that he does not drink alcohol or use drugs.  Family / Support Systems Marital Status: Single Patient Roles: Partner, Other (Comment) (employee) Spouse/Significant Other: Herbie Baltimore [phone] Other Supports: Shayan Bramhall [phone]  Dillian Feig [phone] Anticipated Caregiver: Herbie Baltimore Ability/Limitations of Caregiver: Herbie Baltimore is not employed and can assist pt at [employer] Caregiver Availability: 24/7 Family Dynamics: Pt is close with his family and Herbie Baltimore, he can count on them to assist him if needed. He hopes he will not need much assistance but realizes it will be short term due to his bones will heal. He feels he has good supports at home.  Social History Preferred language: English Religion: Christian Cultural Background: No issues Education: Secretary/administrator educated Read: Yes Write: Yes Employment Status: Employed Name of Employer: Hotel manager Return to Work Plans: Plans to return when healed and able too Freight forwarder Issues: No issues Guardian/Conservator: None-according to MD pt is capable of making his own decisions while here.   Abuse/Neglect Physical Abuse: Denies Verbal Abuse: Denies Sexual Abuse: Denies Exploitation of patient/patient's resources: Denies Self-Neglect: Denies  Emotional Status Pt's affect, behavior adn adjustment status: Pt is motivated to do well but aware of his limitations. He is pushing himself to do what he can for himself and Herbie Baltimore will do the tasks he can not do. He is doing well better than he expected actually, so this is encouraging to him. Recent Psychosocial Issues: other health issues were being managed by  MD's Pyschiatric History: No history deferred depression screen due to pt feels he is doing as expected and coping appropriately. He would benefit from seeing neuro-psych while here. Will make referral. Substance Abuse History: No issues  Patient / Family Perceptions, Expectations & Goals Pt/Family understanding of illness & functional limitations: Pt is able to explain his fall and fractures, he is aware of his WB limitations. He talks with the MD daily and feels his questions and concerns are being addressed. He is hopeful he will do well here on CIR. 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, employee, son, uncle, friend, etc Anticipated changes in roles/activities/participation: resume Pt/family expectations/goals: Pt states: " I want to be able to do all that I can for myself before I leave here."  Herbie Baltimore states: " I will assist him but he wants to do on his own, this is hard on him."  [employer]: Other (Comment) (ESRD-M-W-F-[address]) Premorbid Home Care/DME Agencies: Other (Comment) (has some equipment) Transportation available at discharge: [employer] referrals recommended: Neuropsychology, Support group (specify)  Discharge Planning Living Arrangements: Spouse/significant other Support Systems: Spouse/significant other, Parent, Other relatives, Friends/neighbors Type of Residence: Private residence Insurance Resources: Multimedia programmer (specify) Nurse, mental health) Financial Resources: Employment Museum/gallery curator Screen Referred: No Living Expenses: Education officer, community Management: Patient Does the patient have any problems obtaining your medications?: No Home  Management: Both he and Herbie Baltimore Patient/Family Preliminary Plans: Return home with Herbie Baltimore who is available to assist him at discharge. Aware will need him to come in and learn his care prior to discharge and will work on discharge needs. Aware team conference Wed. Social Work Anticipated Follow Up Needs: HH/OP,  Support Group  Clinical Impression Pleasant quiet gentleman who is motivated to do well and be as independent as he can be with his WB limitations. His partner and family are involved and will assist him at home until he is able to do on his own. Would benefit from seeing neuro-psych while here. Work on discharge needs.  Elease Hashimoto 01/29/2017, 1:08 PM

## 2017-01-29 NOTE — Progress Notes (Signed)
Occupational Therapy Session Note  Patient Details  Name: Kenneth [name] MRN: [id number] Date of Birth: 05/21/1955  Today's Date: 01/29/2017 OT Individual Time: 1117-1201 OT Individual Time Calculation (min): 44 min    Short Term Goals: Week 1:   STG=LTG d/t ELOS  Skilled Therapeutic Interventions/Progress Updates:    Pt received sitting up in w/c agreeable to OT tx session. Pt reports having already washed up today; focus of session on UB strengthening, endurance, standing balance. Pt self-propelled w/c to rehab gym. Completes stand pivot w/c<>mat table with MinA at RW level with small shuffling steps on LLE. Pt engaged in seated ther-ex utilizing 1, 3, and 5 lb weights. Pt utilized 3lb and 5lb with LUE engaging in elbow flexion/extension, shoulder presses into forward and upward extension for 10 reps each exercises with each weight. Pt engaged in light AROM with/without 1lb weight of elbow flexion/extension and forearm pronation/supination in RUE within Pt's tolerance. Pt engaged in standing tabletop activity with MinGuard for static standing balance while utilizing single UE support and min verbal cues for maintaining NWB during standing. Transported Pt back to room via w/c for time management where Pt was left seated in w/c, call bell and needs within reach.   Therapy Documentation Precautions:  Precautions Precautions: Fall Restrictions Weight Bearing Restrictions: Yes RUE Weight Bearing: Weight bearing as tolerated RLE Weight Bearing: Non weight bearing Other Position/Activity Restrictions: RUE sling for comfort   Pain: Pain Assessment Pain Assessment: Faces Faces Pain Scale: Hurts little more Pain Type: Surgical pain Pain Location: Knee Pain Orientation: Right Pain Descriptors / Indicators: Aching;Sore;Grimacing Pain Onset: With Activity Pain Intervention(s): Rest Multiple Pain Sites: Yes 2nd Pain Site Wong-Baker Pain Rating: Hurts little more Pain Type: Acute  pain Pain Location: Shoulder Pain Descriptors / Indicators: Aching;Sore Pain Onset: With Activity Pain Intervention(s): Repositioned;Rest  See Function Navigator for Current Functional Status.   Therapy/Group: Individual Therapy  Raymondo Band 01/29/2017, 4:01 PM

## 2017-01-29 NOTE — Progress Notes (Signed)
[name], MD Physician Signed Physical Medicine and Rehabilitation  Consult Note Date of Service: 01/25/2017 5:35 AM  Related encounter: ED to Hosp-Admission (Discharged) from 01/22/2017 in [name] Collapse All   [] Hide copied text [] Hover for attribution information      Physical Medicine and Rehabilitation Consult Reason for Consult: Decreased functional mobility Referring Physician: Internal medicine   HPI: Kenneth [name] is a 61 y.o. right handed male with history of end-stage renal disease with hemodialysis, hypertension, HIV, lupus nephritis. Per chart review patient lives with significant other. 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 One level home with level entry. Presented 01/22/2017 after mechanical fall with brief loss of consciousness. Cranial CT scan negative. CT cervical spine negative. X-rays and imaging revealed right distal clavicle fracture and right scapular fracture as well as right tibia plateau fracture. Underwent ORIF lateral tibial plateau fracture, arthrotomy with meniscus repair, anterior compartment fasciotomy 1023 per Dr. Marcelino Scot. Hospital course pain management. Hemodialysis ongoing as per renal services. Nonweightbearing right lower extremity with hinged knee brace. Weightbearing as tolerated right upper extremity and sling for comfort. Subcutaneous heparin for DVT prophylaxis. Physical and occupational therapy evaluations completed with recommendations of physical medicine rehabilitation consult.   Review of Systems  Constitutional: Negative for chills and fever.  HENT: Negative for hearing loss.   Eyes: Negative for blurred vision and double vision.  Respiratory: Negative for cough.        Occasional shortness of breath with exertion  Cardiovascular: Positive for palpitations and leg swelling. Negative for chest pain.    Gastrointestinal: Positive for constipation. Negative for nausea and vomiting.       GERD  Genitourinary: Negative for flank pain and hematuria.  Musculoskeletal: Positive for joint pain and myalgias.  Skin: Negative for rash.  Neurological: Negative for seizures and weakness.  All other systems reviewed and are negative.      Past Medical History:  Diagnosis Date  . Achalasia   . Candida infection, esophageal ([place]) 08/2010   a. 2012 - noted on EGD.  Marland Kitchen DVT (deep venous thrombosis) ([place]) ~ 04/2014   ?RLE  . Dysphagia 2008   post heller myotomy/toupee fundoplication.    . ESRD (end stage renal disease) on dialysis [facility])    "MWF: [name]" (01/22/2017)  . Fall 01/22/2017   mechanical fall w/multiple fractures/notes 01/22/2017  . GERD (gastroesophageal reflux disease)    hx (01/22/2017)  . Hemorrhoids, internal   . History of blood transfusion 03/2014   "low counts"  . History of gout   . History of stomach ulcers   . HIV infection ([place]) dx ~ 2009   a. 02/13/2014 CD4 = 240 - undetectable viral load.  . Hyperlipidemia   . Hypertension    hx (01/22/2017)  . Insomnia   . Lupus nephritis ([place])   . Pancytopenia ([place])   . Premature ventricular contractions   . Renal abscess   . SLE (systemic lupus erythematosus) ([place]) dx'd 2015        Past Surgical History:  Procedure Laterality Date  . BASCILIC VEIN TRANSPOSITION Left 06/29/2014   Procedure: FIRST STAGE  [place];  Surgeon: Rosetta Posner, MD;  Location: [place];  Service: Vascular;  Laterality: Left;  . BASCILIC VEIN TRANSPOSITION Left 09/11/2014   Procedure: SECOND STAGE BASILIC VEIN TRANSPOSITION LEFT UPPER ARM;  Surgeon: Rosetta Posner, MD;  Location: [place];  Service: Vascular;  Laterality: Left;  . COLONOSCOPY  2008   .  tortuous colon, internal hemorrhoids.  no polyps or diverticulosis.   Marland Kitchen ESOPHAGOGASTRODUODENOSCOPY  08/2010   Dr [name].  08/2010 candida esophagitis, no  obvious esophageal stricture. 02/2006 and 05/2006 dilated esophagus, narrowed distal esophagus but no stricture, was empirically Savary dilated  . ESOPHAGOGASTRODUODENOSCOPY N/A 06/27/2014   Procedure: ESOPHAGOGASTRODUODENOSCOPY (EGD);  Surgeon: [name], MD;  Location: [facility] ENDOSCOPY;  Service: Endoscopy;  Laterality: N/A;  . ESOPHAGOGASTRODUODENOSCOPY N/A 07/02/2014   Procedure: ESOPHAGOGASTRODUODENOSCOPY (EGD);  Surgeon: [name], MD;  Location[facility] END[facility]COPY;  Service: Endoscopy;  Laterality: N/A;  with botox  . [place]   with toupee fundoplication of HH.   . INSERTION OF DIALYSIS CATHETER Right 06/29/2014   Procedure: INSERTION OF DIALYSIS CATHETER RIGHT IJ;  Surgeon: Rosetta Posner, MD;  Location: [place];  Service: Vascular;  Laterality: Right;  . NEPHRECTOMY Right ~ 2011  . ORIF TIBIA PLATEAU Right 01/23/2017   Procedure: OPEN REDUCTION INTERNAL FIXATION (ORIF) TIBIAL PLATEAU; REPAIR OF LATERAL TIBIAL PLATEAU; ARTHROTOMY WITH MENISCUS REPAIR; ANTERIOR COMPARTMENT FASCIOTOMY;  Surgeon: Altamese [place], MD;  Location: [place];  Service: Orthopedics;  Laterality: Right;        Family History  Problem Relation Age of Onset  . Colon cancer Father        deceased  . Other Mother        s/p pacemaker - alive and well.  . Hypertension Mother   . Other Unknown        5 brothers, 3 sisters - alive and well.   Social History:  reports that he has never smoked. He has never used smokeless tobacco. He reports that he does not drink alcohol or use drugs. Allergies: No Active Allergies       Medications Prior to Admission  Medication Sig Dispense Refill  . abacavir (ZIAGEN) 300 MG tablet Take 2 tablets (600 mg total) by mouth daily. 60 tablet 5  . amitriptyline (ELAVIL) 10 MG tablet Take 10 mg by mouth at bedtime.     . calcium acetate (PHOSLO) 667 MG capsule Take 2 capsules (1,334 mg total) by mouth 3 (three) times daily with meals. 180 capsule 0  . dolutegravir  (TIVICAY) 50 MG tablet TAKE 1 TABLET (50 MG TOTAL) BY MOUTH DAILY. 30 tablet 5  . hydroxychloroquine (PLAQUENIL) 200 MG tablet Take 200 mg by mouth 2 (two) times daily.  0  . lamiVUDine (EPIVIR) 10 MG/ML solution Take 5 mLs (50 mg total) by mouth daily. 480 mL 5  . Multiple Vitamin (MULTIVITAMIN WITH MINERALS) TABS tablet Take 1 tablet by mouth daily.    . predniSONE (DELTASONE) 10 MG tablet Take 10 mg by mouth daily.    Marland Kitchen TIVICAY 50 MG tablet TAKE ONE TABLET (50 MG) BY MOUTH ONCE DAILY. STORE AT ROOM TEMPERATURE. 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 Pt able to support himself with BUEs by gripping onto walker Ambulation/Gait Ambulation/Gait assistance: Min assist Ambulation Distance (Feet): 1 Feet Assistive device: Rolling walker (2 wheeled) General Gait Details: MinA to take scooting step  sideways on L foot while standing at EOB with RW and cues for technique. Pt with good ability to maintain RLE NWB precautions. Further mobility limited secondary to arrival of transport for HD  ADL: ADL Overall ADL's : Needs assistance/impaired Eating/Feeding: Independent Grooming: Supervision/safety, Set up, Sitting Upper Body Bathing: Moderate assistance, Sitting Lower Body Bathing: Maximal assistance, Sit to/from stand Upper Body Dressing : Moderate assistance, Sitting Lower Body Dressing: Maximal assistance, Sit to/from stand Toilet Transfer: Minimal assistance, +2 for safety/equipment, Stand-pivot, RW Toilet Transfer Details (indicate cue type and reason): Simulated sit to stand from EOB  Functional mobility during ADLs: Minimal assistance, +2 for safety/equipment, Rolling walker General ADL Comments: Limited evaluation due to transport taking pt to dialysis. Will further assess   Cognition: Cognition Overall Cognitive Status: Within Functional Limits for tasks assessed Orientation Level: Oriented X4 Cognition Arousal/Alertness: Awake/alert Behavior During Therapy: WFL for tasks assessed/performed Overall Cognitive Status: Within Functional Limits for tasks assessed  Blood pressure 127/75, pulse (!) 111, temperature 100.1 F (37.8 C), temperature source Oral, resp. rate 18, height 6\' 1"  (1.854 m), weight 62.4 kg (137 lb 9.1 oz), SpO2 100 %. Physical Exam  Vitals reviewed. Constitutional: He is oriented to person, place, and time.  HENT:  Head: Normocephalic.  Eyes: EOM are normal.  Neck: Normal range of motion. Neck supple. No thyromegaly present.  Cardiovascular: Normal rate, regular rhythm and normal heart sounds.   Respiratory: Effort normal and breath sounds normal. No respiratory distress.  GI: Soft. Bowel sounds are normal. He exhibits no distension.  Neurological: He is alert and oriented to person, place, and time.  RUE and RLE lmited by orthopedic injuries. LUE  and LLE grossly 4/5. Cognitively appropriate. No sensory loss.   Skin:  Right lower extremity is dressed appropriately tender. Shoulder sling in place to right upper extremity    Lab Results Last 24 Hours  No results found for this or any previous visit (from the past 24 hour(s)).    Imaging Results (Last 48 hours)  Dg Tibia/fibula Right  Result Date: 01/23/2017 CLINICAL DATA:  Fracture fixation EXAM: DG C-ARM 61-120 MIN; RIGHT TIBIA AND FIBULA - 2 VIEW COMPARISON:  01/2020 and 01/22/2017 FINDINGS: Depressed fracture of the lateral tibial plateau has been reduced now with normal joint space. Lateral plate and screws in good position. IMPRESSION: ORIF lateral tibial plateau fracture with normal joint space on postoperative images. Electronically Signed   By: Franchot Gallo M.D.   On: 01/23/2017 14:00   Dg Knee Right Port  Result Date: 01/23/2017 CLINICAL DATA:  ORIF right tibia . EXAM: PORTABLE RIGHT KNEE - 1-2 VIEW COMPARISON:  01/23/2017. FINDINGS: ORIF proximal tibia. Hardware intact. Anatomic alignment. Postsurgical changes noted in the soft tissues. IMPRESSION: ORIF proximal right tibia.  Anatomic alignment. Electronically Signed   By: Marcello Moores  Register   On: 01/23/2017 15:06   Dg C-arm 61-120 Min  Result Date: 01/23/2017 CLINICAL DATA:  Fracture fixation EXAM:  DG C-ARM 61-120 MIN; RIGHT TIBIA AND FIBULA - 2 VIEW COMPARISON:  01/2020 and 01/22/2017 FINDINGS: Depressed fracture of the lateral tibial plateau has been reduced now with normal joint space. Lateral plate and screws in good position. IMPRESSION: ORIF lateral tibial plateau fracture with normal joint space on postoperative images. Electronically Signed   By: Franchot Gallo M.D.   On: 01/23/2017 14:00     Assessment/Plan: Diagnosis: Polytrauma as outline above with right clavicle,scapular, tib-fib fx 1. Does the need for close, 24 hr/day medical supervision in concert with the patient's rehab needs make it unreasonable  for this patient to be served in a less intensive setting? Yes 2. Co-Morbidities requiring supervision/potential complications: pain mgt, wound care 3. Due to bladder management, bowel management, safety, skin/wound care, disease management and pain management, does the patient require 24 hr/day rehab nursing? Yes 4. Does the patient require coordinated care of a physician, rehab nurse, PT (1-2 hrs/day, 5 days/week) and OT (1-2 hrs/day, 5 days/week) to address physical and functional deficits in the context of the above medical diagnosis(es)? Yes Addressing deficits in the following areas: balance, endurance, locomotion, strength, transferring, bowel/bladder control, bathing, dressing, feeding, grooming, toileting, cognition and psychosocial support 5. Can the patient actively participate in an intensive therapy program of at least 3 hrs of therapy per day at least 5 days per week? Yes 6. The potential for patient to make measurable gains while on inpatient rehab is excellent 7. Anticipated functional outcomes upon discharge from inpatient rehab are modified independent  with PT, modified independent and supervision with OT, n/a with SLP. 8. Estimated rehab length of stay to reach the above functional goals is: 13-15 days 9. Anticipated D/C setting: Home 10. Anticipated post D/C treatments: [place] therapy 11. Overall Rehab/Functional Prognosis: excellent  RECOMMENDATIONS: This patient's condition is appropriate for continued rehabilitative care in the following setting: CIR Patient has agreed to participate in recommended program. Yes Note that insurance prior authorization may be required for reimbursement for recommended care.  Comment: Rehab Admissions Coordinator to follow up.  Thanks,  [name], MD, Mellody Drown    Cathlyn Parsons., PA-C 01/25/2017    Revision History                   Routing History

## 2017-01-29 NOTE — Progress Notes (Signed)
Physical Therapy Session Note  Patient Details  Name: Kenneth [name] MRN: [id number] Date of Birth: February 24, 1956  Today's Date: 01/29/2017 PT Individual Time: 1000-1100 PT Individual Time Calculation (min): 60 min   Short Term Goals: Week 1:  PT Short Term Goal 1 (Week 1): STG=LTG due to ELOS  Skilled Therapeutic Interventions/Progress Updates:    no c/o pain at rest, but grimaces with RLE/RUE movement.  RN in to medicate at start of session.  Session focus on use of BUEs during bathing and self care at sink level, and LE strengthening/flexibility.    Pt transitions supine>sit with min guard for RLE, squat/pivot to w/c on L with min guard.  Pt able to bath and perform oral care at sink with set up assist and increased time.  PT provided min verbal cues for use of RUE and hemi dressing technique with hospital gown to reduce pain in RUE.  W/C propulsion to therapy gym with L hemi technique, min verbal cues for sequencing fade to supervision.  PT instructed pt in RLE AROM 2x10 reps within pain tolerance for increased flexibility and reduced pain, 2x10 reps LAQ on LLE with 3# weight for strengthening.  Pt returned to room total assist for time management and positioned upright with call bell in reach and needs met.   Therapy Documentation Precautions:  Precautions Precautions: Fall Restrictions Weight Bearing Restrictions: Yes RUE Weight Bearing: Weight bearing as tolerated RLE Weight Bearing: Non weight bearing Other Position/Activity Restrictions: RUE sling for comfort  Therapy/Group: Individual Therapy  Michel Santee 01/29/2017, 11:04 AM

## 2017-01-29 NOTE — Progress Notes (Signed)
Per hemodialysis nurse, this patient will be go to dialysis tomorrow, Tuesday 10/30 not tiday due to scheduling issues.

## 2017-01-29 NOTE — Progress Notes (Signed)
Kenneth Diones, RN Rehab Admission Coordinator Signed Physical Medicine and Rehabilitation  PMR Pre-admission Date of Service: 01/26/2017 3:10 PM  Related encounter: ED to Hosp-Admission (Discharged) from 01/22/2017 in [place]       [] Hide copied text PMR Admission Coordinator Pre-Admission Assessment  Patient: Kenneth [name] is an 61 y.o., male MRN: [id number] DOB: 09-03-1955 Height: 6' 1"  (185.4 cm) Weight: 60.9 kg (134 lb 4.2 oz)                                                                                                                                      Insurance Information HMO:      PPO: Yes     PCP:       IPA:       80/20:       OTHER:   PRIMARY: BCBS state health plan      Policy#: [id number]      Subscriber:  patient CM Name: Philis Fendt      Phone#: [phone]     Fax#: [phone] Pre-Cert#: [id number] for 01/27/17 to 02/10/17 with update due 02/09/17      Employer:   Benefits:  Phone #: [phone]     Name:  Online Eff. Date: 04/03/16     Deduct:  $1250 (met $1250)      Out of Pocket Max:  [phone] (met $4350)      Life Max: N/A CIR: 80% w/auth      SNF: 80% with 100 days max Outpatient: medical necessity     Co-Pay: $52/visit Home Health: 80%      Co-Pay: 20% DME: 80%     Co-Pay: 20% Providers: in network  Medicaid Application Date:        Case Manager:   Disability Application Date:        Case Worker:    Emergency Tax adviser Information    Name Relation Home Work Mobile   [place] Significant other   Royal Niece   [phone]   [place], [place] Mother [phone]       Current Medical History  Patient Admitting Diagnosis: Polytrauma as outline above with right clavicle,scapular, tib-fib fx   History of Present Illness: A 61 y.o.right handed malewith history of end-stage renal disease with hemodialysis, hypertension, HIV, lupus  nephritis. Per chart review patient lives with significant other. 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 One level home with level entry. Presented 01/22/2017 after mechanical fall with brief loss of consciousness. Cranial CT scan negative. CT cervical spine negative. X-rays and imaging revealed right distal clavicle fracture and right scapular fracture as well as right tibia plateau fracture. Underwent ORIF lateral tibial plateau fracture, arthrotomy with meniscus repair, anterior compartment fasciotomy 1023 per Dr. Marcelino Scot. Hospital course pain management. Hemodialysis ongoing as per renal services. Nonweightbearing  right lower extremity with hinged knee brace. Weightbearing as tolerated right upper extremity and sling for comfort. Subcutaneous heparin for DVT prophylaxis. Physical and occupational therapy evaluations completed with recommendations of physical medicine rehabilitation consult.    Past Medical History  Past Medical History:  Diagnosis Date  . Achalasia   . Candida infection, esophageal ([place]) 08/2010   a. 2012 - noted on EGD.  Marland Kitchen DVT (deep venous thrombosis) ([place]) ~ 04/2014   ?RLE  . Dysphagia 2008   post heller myotomy/toupee fundoplication.    . ESRD (end stage renal disease) on dialysis [facility])    "MWF: [name]" (01/22/2017)  . Fall 01/22/2017   mechanical fall w/multiple fractures/notes 01/22/2017  . GERD (gastroesophageal reflux disease)    hx (01/22/2017)  . Hemorrhoids, internal   . History of blood transfusion 03/2014   "low counts"  . History of gout   . History of stomach ulcers   . HIV infection ([place]) dx ~ 2009   a. 02/13/2014 CD4 = 240 - undetectable viral load.  . Hyperlipidemia   . Hypertension    hx (01/22/2017)  . Insomnia   . Lupus nephritis ([place])   . Pancytopenia ([place])   . Premature ventricular contractions   . Renal abscess   . SLE (systemic lupus erythematosus) ([place]) dx'd  2015    Family History  family history includes Colon cancer in his father; Hypertension in his mother; Other in his mother and unknown relative.  Prior Rehab/Hospitalizations: No previous rehab admissions   Has the patient had major surgery during 100 days prior to admission? No  Current Medications   Current Facility-Administered Medications:  .  0.9 %  sodium chloride infusion, , Intravenous, Continuous, Lillia Abed, MD, Last Rate: 10 mL/hr at 01/23/17 1619 .  abacavir (ZIAGEN) tablet 600 mg, 600 mg, Oral, q1800, Leeanne Rio, MD, 600 mg at 01/25/17 1711 .  acetaminophen (TYLENOL) tablet 650 mg, 650 mg, Oral, Q4H PRN **OR** acetaminophen (TYLENOL) suppository 650 mg, 650 mg, Rectal, Q4H PRN, Ainsley Spinner, PA-C .  amitriptyline (ELAVIL) tablet 10 mg, 10 mg, Oral, QHS, Eloise Levels, MD, 10 mg at 01/25/17 2206 .  calcitRIOL (ROCALTROL) capsule 0.75 mcg, 0.75 mcg, Oral, Q M,W,F-HD, Penninger, Lindsay, PA .  calcium acetate (PHOSLO) capsule 1,334 mg, 1,334 mg, Oral, TID WC, Eloise Levels, MD, 1,334 mg at 01/25/17 1710 .  cholecalciferol (VITAMIN D) tablet 1,000 Units, 1,000 Units, Oral, Daily, Wendee Beavers T, MD, 1,000 Units at 01/25/17 1709 .  docusate sodium (COLACE) capsule 100 mg, 100 mg, Oral, Daily PRN, Guadalupe Dawn, MD, 100 mg at 01/25/17 1712 .  dolutegravir (TIVICAY) tablet 50 mg, 50 mg, Oral, q1800, Leeanne Rio, MD, 50 mg at 01/25/17 1711 .  feeding supplement (NEPRO CARB STEADY) liquid 237 mL, 237 mL, Oral, TID AC, Penninger, Lindsay, PA, 237 mL at 01/25/17 1711 .  heparin injection 5,000 Units, 5,000 Units, Subcutaneous, Q8H, Guadalupe Dawn, MD, 5,000 Units at 01/26/17 1458 .  HYDROcodone-acetaminophen (NORCO) 7.5-325 MG per tablet 1 tablet, 1 tablet, Oral, Q4H PRN, Ainsley Spinner, PA-C, 1 tablet at 01/25/17 0636 .  HYDROcodone-acetaminophen (NORCO) 7.5-325 MG per tablet 2 tablet, 2 tablet, Oral, Q4H PRN, Ainsley Spinner, PA-C .  hydroxychloroquine  (PLAQUENIL) tablet 200 mg, 200 mg, Oral, BID, Eloise Levels, MD, 200 mg at 01/25/17 2210 .  lamiVUDine (EPIVIR) 10 MG/ML solution 25 mg, 25 mg, Oral, q1800, Dang, Thuy D, RPH, 25 mg at 01/25/17 1710 .  metoCLOPramide (REGLAN) tablet 5-10 mg,  5-10 mg, Oral, Q8H PRN **OR** metoCLOPramide (REGLAN) injection 5-10 mg, 5-10 mg, Intravenous, Q8H PRN, Ainsley Spinner, PA-C .  multivitamin with minerals tablet 1 tablet, 1 tablet, Oral, Daily, Ainsley Spinner, PA-C, 1 tablet at 01/25/17 0825 .  ondansetron (ZOFRAN) tablet 4 mg, 4 mg, Oral, Q6H PRN **OR** ondansetron (ZOFRAN) injection 4 mg, 4 mg, Intravenous, Q6H PRN, Ainsley Spinner, PA-C .  polyethylene glycol (MIRALAX / GLYCOLAX) packet 17 g, 17 g, Oral, BID, Tammi Klippel, Sherin, DO, 17 g at 01/25/17 2206 .  predniSONE (DELTASONE) tablet 10 mg, 10 mg, Oral, Daily, Leeanne Rio, MD, 10 mg at 01/25/17 5427  Patients Current Diet: Diet renal with fluid restriction Fluid restriction: 1200 mL Fluid; Room service appropriate? Yes; Fluid consistency: Thin  Precautions / Restrictions Precautions Precautions: Fall Restrictions Weight Bearing Restrictions: Yes RUE Weight Bearing: Weight bearing as tolerated RLE Weight Bearing: Non weight bearing Other Position/Activity Restrictions: RUE sling for comfort   Has the patient had 2 or more falls or a fall with injury in the past year?No  Prior Activity Level Community (5-7x/wk): Went out daily, was driving.  Home Assistive Devices / Equipment Home Assistive Devices/Equipment: Eyeglasses, Built-in shower seat Home Equipment: Shower seat - built in, Crutches  Prior Device Use: Indicate devices/aids used by the patient prior to current illness, exacerbation or injury? 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   Independent  Indoor Mobility: Did the patient need assistance with walking from room to room (with or without device)? Independent  Stairs: Did the patient need assistance with internal or external stairs (with or without device)? Independent  Functional Cognition: Did the patient need help planning regular tasks such as shopping or remembering to take medications? Independent  Current Functional Level Cognition  Overall Cognitive Status: Within Functional Limits for tasks assessed Orientation Level: Oriented X4    Extremity Assessment (includes Sensation/Coordination)  Upper Extremity Assessment: RUE deficits/detail RUE Deficits / Details: s/p R clavicle and scapular fx (WBAT); good grip strength RUE: Unable to fully assess due to pain  Lower Extremity Assessment: RLE deficits/detail RLE Deficits / Details: s/p R tibial ORIF; R hip flex grossly 3/5 RLE: Unable to fully assess due to pain, Unable to fully assess due to immobilization    ADLs  Overall ADL's : Needs assistance/impaired Eating/Feeding: Independent Grooming: Supervision/safety, Set up, Sitting Upper Body Bathing: Moderate assistance, Sitting Lower Body Bathing: Maximal assistance, Sit to/from stand Upper Body Dressing : Moderate assistance, Sitting Lower Body Dressing: Maximal assistance, Sit to/from stand Toilet Transfer: Minimal assistance, +2 for safety/equipment, Stand-pivot, RW Toilet Transfer Details (indicate cue type and reason): Simulated sit to stand from EOB  Functional mobility during ADLs: Minimal assistance, +2 for safety/equipment, Rolling walker General ADL Comments: Limited evaluation due to transport taking pt to dialysis. Will further assess     Mobility  Overal bed mobility: Needs Assistance Bed Mobility: Supine to Sit, Sit to Supine Supine to sit: Min assist Sit to supine: Min assist General bed mobility comments: MinA to assist RLE to EOB; increased time and effort required. Cues  for rolling technique with bed flat; increased effort as not able to assist well with RUE    Transfers  Overall transfer level: Needs assistance Equipment used: Rolling walker (2 wheeled) Transfers: Sit to/from Stand Sit to Stand: Min assist General transfer comment: Pt stood from elevated bed  and again from w/c with RW and minA for balance; pt with increased L-lateral lean contributing to instability. Unable to use RUE to push up into standing, but is able to lightly grip the RW with RUE    Ambulation / Gait / Stairs / Wheelchair Mobility  Ambulation/Gait Ambulation/Gait assistance: Min assist Ambulation Distance (Feet): 5 Feet (+2) Assistive device: Rolling walker (2 wheeled) General Gait Details: Pt able to scoot from bed to w/c and from w/c to chair with RW and minA for balance/to steady RW. Pt with 1x LOB requiring modA to correct Gait velocity: Decreased Wheelchair Mobility Wheelchair mobility: Yes Wheelchair propulsion: Both upper extremities, Left lower extremity Wheelchair parts: Needs assistance Distance: 10 Wheelchair Assistance Details (indicate cue type and reason): Assist to set brakes (pt unable to reach R-side break secondary to R shoulder pain and limited ROM). Pt able to use BUEs (LUE > RUE) and LLE to self-propel w/c a short distance. Required cues for maneuvering w/c in room and assist to position RLE onto elevating leg rest    Posture / Balance Dynamic Sitting Balance Sitting balance - Comments: Min guard for safety Balance Overall balance assessment: Needs assistance Sitting-balance support: Single extremity supported, Feet supported Sitting balance-Leahy Scale: Fair Sitting balance - Comments: Min guard for safety Postural control: Left lateral lean Standing balance support: Bilateral upper extremity supported, During functional activity Standing balance-Leahy Scale: Poor Standing balance comment: Reliant on UE support to maintain RLE NWB precautions      Special needs/care consideration BiPAP/CPAP No CPM No Continuous Drip IV KVO Dialysis Yes        Days M-W-F Life Vest No Oxygen No Special Bed No Trach Size No Wound Vac (area) No     Skin Has right leg hinged knee brace with dressing in place                             Bowel mgmt: No BM since 01/20/17 Bladder mgmt: Anuria Diabetic mgmt No    Previous Home Environment Living Arrangements: Spouse/significant other Available Help at Discharge: Family, Available 24 hours/day (Partner) Type of Home: Apartment Home Layout: One level Home Access: Level entry Bathroom Shower/Tub: Multimedia programmer: Standard Home Care Services: No  Discharge Living Setting Plans for Discharge Living Setting: Lives with (comment), Apartment (Lives with significant other.) Type of Home at Discharge: Apartment Discharge Home Layout: One level Discharge Home Access: Level entry Does the patient have any problems obtaining your medications?: No  Social/Family/Support Systems Patient Roles: Partner, Other (Comment) (Has a significant other, niece, mother.) Contact Information: Heriberto [place] - SO Anticipated Caregiver: Burt Knack - [phone] Ability/Limitations of Caregiver: SO Herbie [place] does not work and can assist. Careers adviser: 24/7 Discharge Plan Discussed with Primary Caregiver: Yes Is Caregiver In Agreement with Plan?: Yes Does Caregiver/Family have Issues with Lodging/Transportation while Pt is in Rehab?: No  Goals/Additional Needs Patient/Family Goal for Rehab: PT mod I, OT mod I and supervision goals Expected length of stay: 13-15 days Cultural Considerations: None Dietary Needs: Renal diet, thin liquids, fluid restriction 1200 mL/day Equipment Needs: TBD Pt/Family Agrees to Admission and willing to participate: Yes Program Orientation Provided & Reviewed with Pt/Caregiver Including Roles  & Responsibilities: Yes  Decrease burden of Care through IP rehab  admission: N/A  Possible need for SNF placement upon discharge: Not planned  Patient Condition: This patient's condition remains as documented in the consult dated 01/25/17, in which the Rehabilitation Physician  determined and documented that the patient's condition is appropriate for intensive rehabilitative care in an inpatient rehabilitation facility. Will admit to inpatient rehab tomorrow.  Preadmission Screen Completed By:  Kenneth [name], 01/26/2017 3:31 PM ______________________________________________________________________   Discussed status with Dr. Naaman Plummer on 01/26/17 at 1531 and received telephone approval for admission tomorrow.  Admission Coordinator:  Kenneth [name], time 1531/Date 01/26/17       Cosigned by: [name], MD at 01/26/2017 4:39 PM  Revision History

## 2017-01-29 NOTE — Progress Notes (Signed)
  [facility] Progress Note   Subjective: Seen in room, now in inpatient rehab. No CP or dyspnea. Feels "same."    Objective Vitals:   01/28/17 0543 01/28/17 1442 01/29/17 0548 01/29/17 1500  BP: 117/68 121/77 128/76 130/74  Pulse: 95 (!) 102 90 92  Resp: 18 20 18 18   Temp: 98.4 F (36.9 C) 98.8 F (37.1 C) 98.1 F (36.7 C) 98.4 F (36.9 C)  TempSrc: Oral Oral Oral Oral  SpO2: 99% 97% 99% 100%  Weight: 60.5 kg (133 lb 5.4 oz)  62.3 kg (137 lb 5.6 oz)   Height:       Physical Exam General: Well appearing male, NAD.  Heart: RRR; no murmur Lungs: CTAB Extremities: No LLE edema, R leg in large brace. R arm in sling.  Dialysis Access: L AVG + bruit  Additional Objective Labs: Basic Metabolic Panel:  Recent Labs Lab 01/25/17 0546 01/26/17 0500 01/27/17 0550  NA 135 138 134*  K 5.0 4.8 4.6  CL 94* 95* 93*  CO2 26 28 29   GLUCOSE 68 121* 101*  BUN 23* 59* 31*  CREATININE 6.82* 9.46* 7.08*  CALCIUM 8.2* 8.4* 8.8*  PHOS 4.3 5.0* 4.8*   Liver Function Tests:  Recent Labs Lab 01/25/17 0546 01/26/17 0500 01/27/17 0550  ALBUMIN 2.6* 2.4* 2.5*    CBC:  Recent Labs Lab 01/23/17 0655  01/24/17 0359 01/25/17 0546 01/26/17 0500 01/27/17 0550  WBC 13.9*  --  17.8* 11.2* 13.0* 12.6*  HGB 13.0  < > 11.3* 11.9* 11.2* 11.5*  HCT 39.3  < > 35.2* 36.5* 35.2* 35.6*  MCV 100.8*  --  99.2 99.2 98.6 98.9  PLT 164  --  155 181 218 218  < > = values in this interval not displayed.  CBG:  Recent Labs Lab 01/26/17 1709  GLUCAP 102*   Medications:  . abacavir  600 mg Oral q1800  . amitriptyline  10 mg Oral QHS  . calcitRIOL  0.75 mcg Oral Q M,W,F-HD  . calcium acetate  1,334 mg Oral TID WC  . cholecalciferol  1,000 Units Oral Daily  . dolutegravir  50 mg Oral Q24H  . feeding supplement (NEPRO CARB STEADY)  237 mL Oral TID AC  . heparin subcutaneous  5,000 Units Subcutaneous Q8H  . hydroxychloroquine  200 mg Oral BID  . lamiVUDine  25 mg Oral q1800   . multivitamin with minerals  1 tablet Oral Daily  . polyethylene glycol  17 g Oral BID  . predniSONE  10 mg Oral Daily    Dialysis Orders: MWF [facility] 4h 65kg 2/2 LUA AVF Hep 5000 Venofer 100 mg IV x4HD - 1/4 completed Calcitriol 0.52mcg PO x3wk Phoslo  Assessment/Plan: 1. Hx Fall/multiple fractures: S/p ORIF right tibial plat fracture + meniscus repair + ant compartment fasciotomy 10/23 by Dr. Marcelino Scot. Non-surgical Rx of other fractures. CIR now. 2. ESRD: Cont HD MWF.   3. Anemia of CKD: Hgb 11's, atgoal. Follow, no ESA yet. 4. Secondary hyperparathyroidism - Ca and P in goal. Continue VDRAand binder. 5. HTN/volume: BP stable. no extra vol on exam. Under dry wt by 4kg.  6. Nutrition: Alb 2.6, continue Nepro supps. 7. HIV: Well controlled on home meds. 8. Lupus: On prednisone 10 mg/d and plaquenil   Kelly Splinter MD [employer] pgr [phone]   01/29/2017, 4:45 PM

## 2017-01-29 NOTE — Progress Notes (Signed)
Physical Therapy Session Note  Patient Details  Name: Kenneth [name] MRN: [id number] Date of Birth: 1955/12/05  Today's Date: 01/29/2017 PT Individual Time: 1305-1400 PT Individual Time Calculation (min): 55 min   Short Term Goals: Week 1:  PT Short Term Goal 1 (Week 1): STG=LTG due to ELOS  Skilled Therapeutic Interventions/Progress Updates:   Pt finishing lunch upon arrival. Functional transfers with RW with min assist overall and cues for technique throughout session. Decreased ability to use RUE due to pain. Supine therex for RLE strengthening and ROM including heel slides, hip abduction/adduction, and SAQ with 3 sec hold x 10 reps each x 2 sets. Rest breaks between sets and cues for deep breathing technique during therex. Min assist for bed mobility on flat surface with assist for RLE management and cues for technique due to decreased functional use of RUE due to pain. Trial with R PFRW to attempt to increase tolerance during gait but limited ability still for foot clearance due to RUE pain with weightbearing in order to maintain NWB status. Handoff to next PT.  Therapy Documentation Precautions:  Precautions Precautions: Fall Restrictions Weight Bearing Restrictions: Yes RUE Weight Bearing: Weight bearing as tolerated RLE Weight Bearing: Non weight bearing Other Position/Activity Restrictions: RUE sling for comfort  Pain:  Pain in RUE and RLE - premedicated.   See Function Navigator for Current Functional Status.   Therapy/Group: Individual Therapy  Canary Brim Ivory Broad, PT, DPT  01/29/2017, 3:29 PM

## 2017-01-29 NOTE — Progress Notes (Signed)
[place] PHYSICAL MEDICINE & REHABILITATION     PROGRESS NOTE    Subjective/Complaints:  No issues overnite pt reports having BM No Pain c/o today   ROS: pt denies nausea, vomiting, diarrhea, cough, shortness of breath or chest pain   Objective: Vital Signs: Blood pressure 128/76, pulse 90, temperature 98.1 F (36.7 C), temperature source Oral, resp. rate 18, height 6\' 1"  (1.854 m), weight 62.3 kg (137 lb 5.6 oz), SpO2 99 %. No results found.  Recent Labs  01/27/17 0550  WBC 12.6*  HGB 11.5*  HCT 35.6*  PLT 218    Recent Labs  01/27/17 0550  NA 134*  K 4.6  CL 93*  GLUCOSE 101*  BUN 31*  CREATININE 7.08*  CALCIUM 8.8*   CBG (last 3)   Recent Labs  01/26/17 1709  GLUCAP 102*    Wt Readings from Last 3 Encounters:  01/29/17 62.3 kg (137 lb 5.6 oz)  01/26/17 59.7 kg (131 lb 9.8 oz)  07/05/16 66.9 kg (147 lb 6.4 oz)    Physical Exam:  Constitutional: He appears well-developedand well-nourished. No distress.  HENT:  Head: Normocephalic.  Eyes: EOMare normal.  Neck: Normal range of motion. Neck supple. No thyromegalypresent.  Cardiovascular: RRR without murmur. No JVD  Respiratory: CTA Bilaterally without wheezes or rales. Normal effort   GI: Soft. Bowel sounds are normal. He exhibits no distension. There is no tenderness. There is no rebound.  Skin. Right lower extremity dressed.  Musc: Right distal clavicle tender and RLE tender with basic movement/ROM Neurological: He is alertand oriented to person, place, and time. No CN abnl RUE and RLE with pain inhibition. LUE and LLE 4/5 proximal to distal. Sensory function intact in all 4's. Cognitively intact Psych: flat but appropriate  Assessment/Plan: 1. Decreased functional mobility secondary to polytrauma which require 3+ hours per day of interdisciplinary therapy in a comprehensive inpatient rehab setting. Physiatrist is providing close team supervision and 24 hour management of active medical  problems listed below. Physiatrist and rehab team continue to assess barriers to discharge/monitor patient progress toward functional and medical goals.  Function:  Bathing Bathing position      Bathing parts Body parts bathed by patient: Right arm, Left arm, Chest, Abdomen, Front perineal area, Buttocks, Left upper leg, Left lower leg Body parts bathed by helper: Back  Bathing assist Assist Level: Supervision or verbal cues      Upper Body Dressing/Undressing Upper body dressing   What is the patient wearing?: Hospital gown                Upper body assist        Lower Body Dressing/Undressing Lower body dressing   What is the patient wearing?: Hospital Gown, [place] - Performed by helper: Don/doff right sock, Don/doff left sock              Lower body assist        Toileting Toileting Toileting activity did not occur: No continent bowel/bladder event        Toileting assist     Transfers Chair/bed transfer   Chair/bed transfer method: Stand pivot, Squat pivot Chair/bed transfer assist level: Touching or steadying assistance (Pt > 75%) Chair/bed transfer assistive device: Walker, Air cabin crew     Max distance: 10 ft Assist level: Touching or steadying assistance (Pt > 75%) (R LE NWB)   Wheelchair  Type: Manual Max wheelchair distance: 55 ft Assist Level: Touching or steadying assistance (Pt > 75%)  Cognition Comprehension Comprehension assist level: Follows complex conversation/direction with extra time/assistive device  Expression Expression assist level: Expresses complex 90% of the time/cues < 10% of the time  Social Interaction Social Interaction assist level: Interacts appropriately with others with medication or extra time (anti-anxiety, antidepressant).  Problem Solving Problem solving assist level: Solves complex 90% of the time/cues < 10% of the time  Memory  Memory assist level: Recognizes  or recalls 90% of the time/requires cueing < 10% of the time  Medical Problem List and Plan: 1. Decreased functional mobilitysecondary to fall sustaining right clavicle fracture, right scapular fracture and right tibia fibula fracture-status post ORIF. Nonweightbearing right lower extremity with hinge knee brace. Weightbearing as tolerated right upper extremity with sling for comfort  CIR PT, OT 2. DVT Prophylaxis/Anticoagulation: Subcutaneous heparin. Check vascular study 3. Pain Management: Elavil 10 mg daily at bedtime, hydrocodone as needed 4. Mood: Provide emotional support 5. Neuropsych: This patient iscapable of making decisions on hisown behalf. 6. Skin/Wound Care: Routine skin checks 7. Fluids/Electrolytes/Nutrition: Routine INO's with follow-up chemistries 8.ESRD. Continue hemodialysis as per renal services 9.HIV. Continue antivirals 10.Lupus nephritis. Chronic prednisone as well as plaquenil   -secondary leukocytosis- afebrile  LOS (Days) 2 A FACE TO FACE EVALUATION WAS PERFORMED  Charlett Blake, MD 01/29/2017 8:05 AM

## 2017-01-29 NOTE — Care Management Note (Signed)
[facility] Individual Statement of Services  Patient Name:  Kenneth [name]  Date:  01/29/2017  Welcome to the [facility].  Our goal is to provide you with an individualized program based on your diagnosis and situation, designed to meet your specific needs.  With this comprehensive rehabilitation program, you will be expected to participate in at least 3 hours of rehabilitation therapies Monday-Friday, with modified therapy programming on the weekends.  Your rehabilitation program will include the following services:  Physical Therapy (PT), Occupational Therapy (OT), 24 hour per day rehabilitation nursing, Neuropsychology, Case Management (Social Worker), Rehabilitation Medicine, Nutrition Services and Pharmacy Services  Weekly team conferences will be held on Wednesday to discuss your progress.  Your Social Worker will talk with you frequently to get your input and to update you on team discussions.  Team conferences with you and your family in attendance may also be held.  Expected length of stay: mod/i-supervision-ambulation  Overall anticipated outcome: 7-10 days  Depending on your progress and recovery, your program may change. Your Social Worker will coordinate services and will keep you informed of any changes. Your Social Worker's name and contact numbers are listed  below.  The following services may also be recommended but are not provided by the [facility] will be made to provide these services after discharge if needed.  Arrangements include referral to agencies that provide these services.  Your insurance has been verified to be:  [name] Your primary doctor is:  Phill Myron  Pertinent information will be shared with your doctor and your insurance company.  Social  Worker:  Ovidio Kin, [place] or ([phone]  Information discussed with and copy given to patient by: Elease Hashimoto, 01/29/2017, 12:55 PM

## 2017-01-30 ENCOUNTER — Encounter (HOSPITAL_COMMUNITY): Payer: Self-pay | Admitting: *Deleted

## 2017-01-30 ENCOUNTER — Inpatient Hospital Stay (HOSPITAL_COMMUNITY): Payer: BC Managed Care – PPO

## 2017-01-30 ENCOUNTER — Inpatient Hospital Stay (HOSPITAL_COMMUNITY): Payer: BC Managed Care – PPO | Admitting: Physical Therapy

## 2017-01-30 ENCOUNTER — Inpatient Hospital Stay (HOSPITAL_COMMUNITY): Payer: BC Managed Care – PPO | Admitting: Occupational Therapy

## 2017-01-30 DIAGNOSIS — S42001S Fracture of unspecified part of right clavicle, sequela: Secondary | ICD-10-CM

## 2017-01-30 LAB — CBC
HCT: 30.6 % — ABNORMAL LOW (ref 39.0–52.0)
Hemoglobin: 10.4 g/dL — ABNORMAL LOW (ref 13.0–17.0)
MCH: 32.5 pg (ref 26.0–34.0)
MCHC: 34 g/dL (ref 30.0–36.0)
MCV: 95.6 fL (ref 78.0–100.0)
Platelets: 375 10*3/uL (ref 150–400)
RBC: 3.2 MIL/uL — ABNORMAL LOW (ref 4.22–5.81)
RDW: 14.3 % (ref 11.5–15.5)
WBC: 13.8 10*3/uL — ABNORMAL HIGH (ref 4.0–10.5)

## 2017-01-30 LAB — RENAL FUNCTION PANEL
ALBUMIN: 2.7 g/dL — AB (ref 3.5–5.0)
Anion gap: 19 — ABNORMAL HIGH (ref 5–15)
BUN: 124 mg/dL — AB (ref 6–20)
CO2: 21 mmol/L — ABNORMAL LOW (ref 22–32)
Calcium: 8.8 mg/dL — ABNORMAL LOW (ref 8.9–10.3)
Chloride: 90 mmol/L — ABNORMAL LOW (ref 101–111)
Creatinine, Ser: 14.62 mg/dL — ABNORMAL HIGH (ref 0.61–1.24)
GFR, EST AFRICAN AMERICAN: 4 mL/min — AB (ref >60)
GFR, EST NON AFRICAN AMERICAN: 3 mL/min — AB (ref >60)
Glucose, Bld: 129 mg/dL — ABNORMAL HIGH (ref 65–99)
POTASSIUM: 6.6 mmol/L — AB (ref 3.5–5.1)
Phosphorus: 5.2 mg/dL — ABNORMAL HIGH (ref 2.5–4.6)
Sodium: 130 mmol/L — ABNORMAL LOW (ref 135–145)

## 2017-01-30 LAB — POTASSIUM: Potassium: 5.5 mmol/L — ABNORMAL HIGH (ref 3.5–5.1)

## 2017-01-30 MED ORDER — CALCITRIOL 0.5 MCG PO CAPS
ORAL_CAPSULE | ORAL | Status: AC
  Filled 2017-01-30: qty 1

## 2017-01-30 MED ORDER — CALCITRIOL 0.25 MCG PO CAPS
ORAL_CAPSULE | ORAL | Status: AC
  Filled 2017-01-30: qty 1

## 2017-01-30 NOTE — Procedures (Signed)
On HD, no c/o's.  At dry wt.    I was present at this dialysis session, have reviewed the session itself and made  appropriate changes Kelly Splinter MD [place] pager [phone]   01/30/2017, 4:20 PM

## 2017-01-30 NOTE — Progress Notes (Signed)
Occupational Therapy Session Note  Patient Details  Name: Kenneth [name] MRN: [id number] Date of Birth: 1955/09/01  Today's Date: 01/30/2017 OT Individual Time: 9574-7340 OT Individual Time Calculation (min): 56 min    Short Term Goals: Week 1:   STG=LTG 2/2 ELOS  Skilled Therapeutic Interventions/Progress Updates:    Pt greeted sitting in wc and stated he already washed up with PT earlier but agreeable to changing out of gowns and into paper scrubs. Pt needed assistance to thread R LE, then able to thread L LE. Min A sit<>stand and steady A for balance when reaching to pull up pants. Shirt donned in sitting with pt needing assistance to pull shirt overhead and down in bacl 2/2 limited R UE ROM 2/2 pain with clav fx. Worked on wc propulsion with pt unable to achieve functional shoulder ROM to propel wc forward. Practiced walk-in shower transfer in simulated home environment. Pt will likely not be able to transfer safely into shower with built in bench and maintain NWB. Practiced using shower chair and tub bench and pt able to stand-pivot with min A, then lift LEs over shower ledge w/ min A. Worked on B UE there-ex with gentle PROM and AROM of R UE. Pt then completed 6 sit<>stands with min A and increased time-able to maintain NWB R LE throughout session. Pt left seated in wc at end of session with needs met and call bell in reach.   Therapy Documentation Precautions:  Precautions Precautions: Fall Restrictions Weight Bearing Restrictions: Yes RUE Weight Bearing: Weight bearing as tolerated RLE Weight Bearing: Non weight bearing Other Position/Activity Restrictions: RUE sling for comfort Pain: Pain Assessment Pain Assessment: 0-10 Pain Score: 4  Pain Type: Surgical pain Pain Location: Knee Pain Orientation: Right Pain Descriptors / Indicators: Aching Pain Onset: With Activity Pain Intervention(s): Repositioned  See Function Navigator for Current Functional  Status.   Therapy/Group: Individual Therapy  Valma Cava 01/30/2017, 12:08 PM

## 2017-01-30 NOTE — Progress Notes (Signed)
Physical Therapy Session Note  Patient Details  Name: Kenneth [name] MRN: [id number] Date of Birth: Feb 03, 1956  Today's Date: 01/30/2017 PT Individual Time: 0910-1004 PT Individual Time Calculation (min): 54 min   Short Term Goals: Week 1:  PT Short Term Goal 1 (Week 1): STG=LTG due to ELOS  Skilled Therapeutic Interventions/Progress Updates:    no c/o pain.  Session focus on activity tolerance with RUE during functional self care tasks, transfers, gait, and strengthening.    Pt performs bed mobility and stand/pivot bed>w/c with RW and supervision.  Bathing at sink level mod I.  Pt propels w/c to therapy gym with supervision and PT instructs pt in w/c parts management, which he returns demonstration for.  Gait x26' with RW, hopping pattern with min guard.  Pt unable to bear enough weight through RUE to lift LLE off floor for swing-to pattern.  Continue to discuss w/c level mobility at home versus hopping with a shoe for reduced impact on L heel.  LE therex 2x10 reps (3# weight on LLE) LAQ, seated marching, and seated heel/toe raises. Pt returned to room total assist at end of session for time management, call bell in reach and needs met.   Therapy Documentation Precautions:  Precautions Precautions: Fall Restrictions Weight Bearing Restrictions: Yes RUE Weight Bearing: Weight bearing as tolerated RLE Weight Bearing: Non weight bearing Other Position/Activity Restrictions: RUE sling for comfort  See Function Navigator for Current Functional Status.   Therapy/Group: Individual Therapy  Michel Santee 01/30/2017, 11:01 AM

## 2017-01-30 NOTE — IPOC Note (Signed)
Overall Plan of Care [facility]) Patient Details Name: Kenneth [name] MRN: [id number] DOB: 1955-10-04  Admitting Diagnosis: <principal problem not specified>  Hospital Problems: Active Problems:   ESRD (end stage renal disease) ([place])   Fracture of right tibial plateau, sequela     Functional Problem List: Nursing Skin Integrity, Bowel, Medication Management, Pain  PT Balance, Behavior, Edema, Endurance, Motor, Pain, Sensory, Skin Integrity  OT Balance, Endurance, Pain, Safety  SLP    TR         Basic ADL's: OT Grooming, Bathing, Dressing, Toileting, Other (comment)     Advanced  ADL's: OT       Transfers: PT Bed Mobility, Bed to Chair, Car, Furniture, Futures trader, Metallurgist: PT Ambulation, Emergency planning/management officer, Stairs     Additional Impairments: OT None  SLP        TR      Anticipated Outcomes Item Anticipated Outcome  Self Feeding MOD I  Swallowing      Basic self-care  MOD I  Toileting  MOD I toilet; S bathing   Bathroom Transfers MOD I toilet; S bathing  Bowel/Bladder  will have regular BM's while in IP Rehab  Transfers  Mod I  Locomotion  supervision  Communication     Cognition     Pain  will remain as pain feree as possible with realalistic goals  Safety/Judgment      Therapy Plan: PT Intensity: Minimum of 1-2 x/day ,45 to 90 minutes PT Frequency: 5 out of 7 days PT Duration Estimated Length of Stay: 7-9 days OT Intensity: Minimum of 1-2 x/day, 45 to 90 minutes OT Frequency: 5 out of 7 days OT Duration/Estimated Length of Stay: 7-10      Team Interventions: Nursing Interventions Patient/Family Education, Bowel Management, Pain Management  PT interventions Ambulation/gait training, Medical illustrator training, Discharge planning, DME/adaptive equipment instruction, Pain management, Splinting/orthotics, Stair training, Patient/family education, UE/LE Coordination activities, UE/LE Strength taining/ROM, Wheelchair  propulsion/positioning, Therapeutic Exercise, Neuromuscular re-education, Therapeutic Activities, Functional mobility training  OT Interventions Balance/vestibular training, Discharge planning, Pain management, Self Care/advanced ADL retraining, Therapeutic Activities, UE/LE Coordination activities, Therapeutic Exercise, Patient/family education, Functional mobility training, Disease mangement/prevention, Community reintegration, Engineer, drilling, Psychosocial support, UE/LE Strength taining/ROM, Wheelchair propulsion/positioning  SLP Interventions    TR Interventions    SW/CM Interventions Discharge Planning, Psychosocial Support, Patient/Family Education   Barriers to Discharge MD  Home enviroment access/loayout and Lack of/limited family support  Nursing Weight bearing restrictions    PT Weight bearing restrictions    OT Weight bearing restrictions    SLP      SW       Team Discharge Planning: Destination: PT-Home ,OT- Home , SLP-  Projected Follow-up: PT-Home health PT, OT-  Home health OT, SLP-  Projected Equipment Needs: PT-Wheelchair (measurements), OT- 3 in 1 bedside comode, Tub/shower seat, To be determined, SLP-  Equipment Details: PT- , OT-  Patient/family involved in discharge planning: PT- Patient,  OT-Patient, SLP-   MD ELOS: 10-13d Medical Rehab Prognosis:  Good Assessment: 61 y.o.right handed malewith history of end-stage renal disease with hemodialysis, hypertension, HIV, lupus nephritis. Per chart review patient lives with significant other. 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 One level home with level entry. Presented 01/22/2017 after mechanical fall with brief loss of consciousness. Cranial CT scan negative. CT cervical spine negative. X-rays and imaging revealed right distal clavicle fracture and right scapular fracture as well as right  tibia plateau fracture. Underwent ORIF lateral tibial  plateau fracture, arthrotomy with meniscus repair, anterior compartment fasciotomy 1023 per Dr. Marcelino Scot. Hospital course pain management. Hemodialysis ongoing as per renal services. Nonweightbearing right lower extremity with hinged knee brace  Now requiring 24/7 Rehab RN,MD, as well as CIR level PT, OT .  Treatment team will focus on ADLs and mobility with goals set  At Mod I   See Team Conference Notes for weekly updates to the plan of care

## 2017-01-30 NOTE — Progress Notes (Signed)
Received a call from [place] at 20:35 stating she received a critical Lab result from the lab K+ 6.6. Kenneth [name] is a dialysis patient, asked jennifer to call the hemodialysis unit to see if this lab was drawn pre dialysis. [name] spoke to [place] she reports Kenneth [name] was dialyzed from 15:03 and completed his dialysis at 18:32. [name] called the lab and spoke with D.Bradley he states  Melissa RN was listed on the tube of blood, the blood was drawn at 15:56( the blood sample was kept in the lab as they waited for an order) the lab did not receive the order until after 19:00. This is why we are receiving a critical report on a blood sample that was drawn at 15:56.  [name] reports Doctor, hospital received report from hemodialysis at 19:24 with no mention of lab-work. We will repeat K+, Juliette Mangle is aware awaiting results.

## 2017-01-30 NOTE — Progress Notes (Signed)
Lab results noted and called to provider of 5.5 who asked that I call dialysis and inquire how soon they would be able to treat him in the morning. I spoke with Tori who states that "I don't know when they will get him in the morning." Tori attempted to ask what was the problem and I explained the situation and informed her that on call provider is aware of issue of the blood draw with the critical k+ of 6.6. Safety maintained. Pt made aware of the current k+ level and will continue to monitor.

## 2017-01-30 NOTE — Progress Notes (Signed)
[facility] PHYSICAL MEDICINE & REHABILITATION     PROGRESS NOTE    Subjective/Complaints:  No issues overnite, little R shoulder pain at rest   ROS: pt denies nausea, vomiting, diarrhea, cough, shortness of breath or chest pain   Objective: Vital Signs: Blood pressure 135/69, pulse 83, temperature 98.6 F (37 C), temperature source Oral, resp. rate 16, height 6\' 1"  (1.854 m), weight 62.3 kg (137 lb 5.6 oz), SpO2 99 %. No results found. No results for input(s): WBC, HGB, HCT, PLT in the last 72 hours. No results for input(s): NA, K, CL, GLUCOSE, BUN, CREATININE, CALCIUM in the last 72 hours.  Invalid input(s): CO CBG (last 3)  No results for input(s): GLUCAP in the last 72 hours.  Wt Readings from Last 3 Encounters:  01/29/17 62.3 kg (137 lb 5.6 oz)  01/26/17 59.7 kg (131 lb 9.8 oz)  07/05/16 66.9 kg (147 lb 6.4 oz)    Physical Exam:  Constitutional: He appears well-developedand well-nourished. No distress.  HENT:  Head: Normocephalic.  Eyes: EOMare normal.  Neck: Normal range of motion. Neck supple. No thyromegalypresent.  Cardiovascular: RRR without murmur. No JVD  Respiratory: CTA Bilaterally without wheezes or rales. Normal effort   GI: Soft. Bowel sounds are normal. He exhibits no distension. There is no tenderness. There is no rebound.  Skin. Right lower extremity dressed.  Musc: Right distal clavicle tender and RLE tender with basic movement/ROM Neurological: He is alertand oriented to person, place, and time. No CN abnl RUE and RLE with pain inhibition. LUE and LLE 4/5 proximal to distal. Sensory function intact in all 4's. Cognitively intact Psych: flat but appropriate  Assessment/Plan: 1. Decreased functional mobility secondary to polytrauma which require 3+ hours per day of interdisciplinary therapy in a comprehensive inpatient rehab setting. Physiatrist is providing close team supervision and 24 hour management of active medical problems listed  below. Physiatrist and rehab team continue to assess barriers to discharge/monitor patient progress toward functional and medical goals.  Function:  Bathing Bathing position   Position: Wheelchair/chair at sink  Bathing parts Body parts bathed by patient: Right arm, Left arm, Chest, Abdomen, Front perineal area, Buttocks, Left upper leg, Left lower leg, Back Body parts bathed by helper: Back  Bathing assist Assist Level: Set up      Upper Body Dressing/Undressing Upper body dressing   What is the patient wearing?: Hospital gown                Upper body assist        Lower Body Dressing/Undressing Lower body dressing   What is the patient wearing?: [place] - Performed by helper: Don/doff right sock, Don/doff left sock              Lower body assist        Toileting Toileting Toileting activity did not occur: No continent bowel/bladder event        Toileting assist     Transfers Chair/bed transfer   Chair/bed transfer method: Squat pivot Chair/bed transfer assist level: Touching or steadying assistance (Pt > 75%) Chair/bed transfer assistive device: Armrests, Orthosis, Bedrails     Locomotion Ambulation     Max distance: 5' Assist level: Touching or steadying assistance (Pt > 75%)   Wheelchair   Type: Manual Max wheelchair distance: 150 Assist Level: Supervision or verbal cues  Cognition Comprehension Comprehension assist level: Follows complex conversation/direction with  extra time/assistive device  Expression Expression assist level: Expresses complex 90% of the time/cues < 10% of the time  Social Interaction Social Interaction assist level: Interacts appropriately with others with medication or extra time (anti-anxiety, antidepressant).  Problem Solving Problem solving assist level: Solves complex 90% of the time/cues < 10% of the time  Memory  Memory assist level: Recognizes or recalls 90% of the time/requires  cueing < 10% of the time  Medical Problem List and Plan: 1. Decreased functional mobilitysecondary to fall sustaining right clavicle fracture, right scapular fracture and right tibia fibula fracture-status post ORIF. Nonweightbearing right lower extremity with hinge knee brace. Weightbearing as tolerated right upper extremity with sling for comfort  CIR PT, OT, team conf in am 2. DVT Prophylaxis/Anticoagulation: Subcutaneous heparin. Check vascular study 3. Pain Management: Elavil 10 mg daily at bedtime, hydrocodone as needed, cont current meds 4. Mood: Provide emotional support 5. Neuropsych: This patient iscapable of making decisions on hisown behalf. 6. Skin/Wound Care: Routine skin checks 7. Fluids/Electrolytes/Nutrition: I 350ml Enc fluids 8.ESRD. Continue hemodialysis as per renal services 9.HIV. Continue antivirals 10.Lupus nephritis. Chronic prednisone as well as plaquenil   -secondary leukocytosis- afebrile  LOS (Days) 3 A FACE TO FACE EVALUATION WAS PERFORMED  Charlett Blake, MD 01/30/2017 6:46 AM

## 2017-01-30 NOTE — Progress Notes (Signed)
Physical Therapy Session Note  Patient Details  Name: QUENCY TOBER MRN: [id number] Date of Birth: Aug 05, 1955  Today's Date: 01/30/2017 PT Individual Time: 4010-2725 PT Individual Time Calculation (min): 72 min   Short Term Goals: Week 1:  PT Short Term Goal 1 (Week 1): STG=LTG due to ELOS  Skilled Therapeutic Interventions/Progress Updates:    Pt seated in w/c upon PT arrival, agreeable to therapy tx and reports pain 4/10 in R LE, but describes it as tolerable. Pt propelled w/c from room to gym using B UEs with supervision, increased time to complete secondary to slow propulsion speed due to R UE pain and weakness. Pt worked on standing balance to stand with RW and use L UE to complete puzzle x 5 minutes, with supervision. Pt seated in w/c performed 2 x 10 LAQ for LE strengthening. Pt performed x 2 stand pivot transfers with RW and min assist. Pt ambulated x 20 ft using RW, hopping on L LE in order to maintain R LE NWB precautions, decreased WB through L UE secondary to pain, min assist.  Pt standing with RW performed 2 x 10 hip flexion and 2 x 10 hip abduction for LE strengthening. Pt performed stand pivot from w/c<>rehab apartment bed using RW and min assist. Pt performed stand pivot transfer from w/c<>recliner in rehab apartment with min assist and RW. Pt transferred from w/c>bed and sitting>supine with supervision, pt left supine in bed with needs in reach.   Therapy Documentation Precautions:  Precautions Precautions: Fall Restrictions Weight Bearing Restrictions: Yes RUE Weight Bearing: Weight bearing as tolerated RLE Weight Bearing: Non weight bearing Other Position/Activity Restrictions: RUE sling for comfort   See Function Navigator for Current Functional Status.   Therapy/Group: Individual Therapy  Netta Corrigan, PT, DPT 01/30/2017, 1:56 PM

## 2017-01-31 ENCOUNTER — Inpatient Hospital Stay (HOSPITAL_COMMUNITY): Payer: BC Managed Care – PPO | Admitting: Occupational Therapy

## 2017-01-31 ENCOUNTER — Inpatient Hospital Stay (HOSPITAL_COMMUNITY): Payer: BC Managed Care – PPO | Admitting: Physical Therapy

## 2017-01-31 LAB — BASIC METABOLIC PANEL
ANION GAP: 13 (ref 5–15)
BUN: 57 mg/dL — AB (ref 6–20)
CALCIUM: 9 mg/dL (ref 8.9–10.3)
CO2: 28 mmol/L (ref 22–32)
Chloride: 95 mmol/L — ABNORMAL LOW (ref 101–111)
Creatinine, Ser: 9.11 mg/dL — ABNORMAL HIGH (ref 0.61–1.24)
GFR calc Af Amer: 6 mL/min — ABNORMAL LOW (ref >60)
GFR, EST NON AFRICAN AMERICAN: 6 mL/min — AB (ref >60)
GLUCOSE: 90 mg/dL (ref 65–99)
POTASSIUM: 5.5 mmol/L — AB (ref 3.5–5.1)
SODIUM: 136 mmol/L (ref 135–145)

## 2017-01-31 NOTE — Progress Notes (Signed)
[place] PHYSICAL MEDICINE & REHABILITATION     PROGRESS NOTE    Subjective/Complaints:  Pain fairly well controlled.  No other issues Appreciate notes from renal and on call  ROS: pt denies nausea, vomiting, diarrhea, cough, shortness of breath or chest pain   Objective: Vital Signs: Blood pressure 126/72, pulse 91, temperature 99.1 F (37.3 C), temperature source Oral, resp. rate 17, height 6' 1"  (1.854 m), weight 63.5 kg (139 lb 15.9 oz), SpO2 100 %. No results found.  Recent Labs  01/30/17 1556  WBC 13.8*  HGB 10.4*  HCT 30.6*  PLT 375    Recent Labs  01/30/17 1556 01/30/17 2202  NA 130*  --   K 6.6* 5.5*  CL 90*  --   GLUCOSE 129*  --   BUN 124*  --   CREATININE 14.62*  --   CALCIUM 8.8*  --    CBG (last 3)  No results for input(s): GLUCAP in the last 72 hours.  Wt Readings from Last 3 Encounters:  01/31/17 63.5 kg (139 lb 15.9 oz)  01/26/17 59.7 kg (131 lb 9.8 oz)  07/05/16 66.9 kg (147 lb 6.4 oz)    Physical Exam:  Constitutional: He appears well-developedand well-nourished. No distress.  HENT:  Head: Normocephalic.  Eyes: EOMare normal.  Neck: Normal range of motion. Neck supple. No thyromegalypresent.  Cardiovascular: RRR without murmur. No JVD  Respiratory: CTA Bilaterally without wheezes or rales. Normal effort   GI: Soft. Bowel sounds are normal. He exhibits no distension. There is no tenderness. There is no rebound.  Skin. Right lower extremity dressed.  Musc: Right distal clavicle tender and RLE tender with basic movement/ROM Neurological: He is alertand oriented to person, place, and time. No CN abnl RUE and RLE with pain inhibition. LUE and LLE 4/5 proximal to distal. Sensory function intact in all 4's. Cognitively intact Psych: flat but appropriate  Assessment/Plan: 1. Decreased functional mobility secondary to polytrauma which require 3+ hours per day of interdisciplinary therapy in a comprehensive inpatient rehab  setting. Physiatrist is providing close team supervision and 24 hour management of active medical problems listed below. Physiatrist and rehab team continue to assess barriers to discharge/monitor patient progress toward functional and medical goals.  Function:  Bathing Bathing position   Position: Wheelchair/chair at sink  Bathing parts Body parts bathed by patient: Right arm, Left arm, Chest, Abdomen, Front perineal area, Buttocks, Left upper leg, Left lower leg, Back Body parts bathed by helper: Back  Bathing assist Assist Level: Set up      Upper Body Dressing/Undressing Upper body dressing   What is the patient wearing?: Pull over shirt/dress     Pull over shirt/dress - Perfomed by patient: Thread/unthread right sleeve, Thread/unthread left sleeve Pull over shirt/dress - Perfomed by helper: Put head through opening, Pull shirt over trunk        Upper body assist Assist Level: Touching or steadying assistance(Pt > 75%)      Lower Body Dressing/Undressing Lower body dressing   What is the patient wearing?: Pants     Pants- Performed by patient: Thread/unthread left pants leg Pants- Performed by helper: Thread/unthread right pants leg, Pull pants up/down       Socks - Performed by helper: Don/doff right sock, Don/doff left sock              Lower body assist Assist for lower body dressing: Touching or steadying assistance (Pt > 75%)      Toileting Toileting Toileting activity  did not occur: No continent bowel/bladder event   Toileting steps completed by helper: Adjust clothing prior to toileting, Performs perineal hygiene, Adjust clothing after toileting (per Irven Shelling, NT)    Toileting assist     Transfers Chair/bed transfer   Chair/bed transfer method: Stand pivot Chair/bed transfer assist level: Touching or steadying assistance (Pt > 75%) Chair/bed transfer assistive device: Armrests, Medical sales representative     Max distance: 20  ft Assist level: Touching or steadying assistance (Pt > 75%)   Wheelchair   Type: Manual Max wheelchair distance: 150 Assist Level: Supervision or verbal cues  Cognition Comprehension Comprehension assist level: Follows complex conversation/direction with no assist  Expression Expression assist level: Expresses complex ideas: With no assist  Social Interaction Social Interaction assist level: Interacts appropriately with others - No medications needed.  Problem Solving Problem solving assist level: Solves complex problems: With extra time  Memory  Memory assist level: Recognizes or recalls 90% of the time/requires cueing < 10% of the time  Medical Problem List and Plan: 1. Decreased functional mobilitysecondary to fall sustaining right clavicle fracture, right scapular fracture and right tibia fibula fracture-status post ORIF. Nonweightbearing right lower extremity with hinge knee brace. Weightbearing as tolerated right upper extremity with sling for comfort  CIR PT, OT, Team conference today please see physician documentation under team conference tab, met with team face-to-face to discuss problems,progress, and goals. Formulized individual treatment plan based on medical history, underlying problem and comorbidities. 2. DVT Prophylaxis/Anticoagulation: Subcutaneous heparin. Check vascular study 3. Pain Management: Elavil 10 mg daily at bedtime, hydrocodone as needed, cont current meds 4. Mood: Provide emotional support 5. Neuropsych: This patient iscapable of making decisions on hisown behalf. 6. Skin/Wound Care: Routine skin checks 7. Fluids/Electrolytes/Nutrition: I 428m Enc fluids 8.ESRD. Continue hemodialysis as per renal services  Elevated K pre HD, now resolved 9.HIV. Continue antivirals 10.Lupus nephritis. Chronic prednisone as well as plaquenil   -secondary leukocytosis- afebrile  LOS (Days) 4 A FACE TO FACE EVALUATION WAS PERFORMED  KCharlett Blake MD 01/31/2017  9:44 AM

## 2017-01-31 NOTE — Progress Notes (Signed)
Social Work Patient ID: Kenneth [name], male   DOB: Mar 14, 1956, 61 y.o.   MRN: [id number]  Met with pt to discuss team conference goals supervision-mod/i wheelchair level and target discharge 11/3. He is pleased with the discharge date and feels he can be more comfortable at home. He even smiled about the discharge date. He voiced he has been so tired from the therapies and feels at home it will be a slower pace. Work on discharge needs for Sat.

## 2017-01-31 NOTE — Progress Notes (Addendum)
  [facility] Progress Note   Subjective: no c/o.   Objective Vitals:   01/30/17 1800 01/30/17 1830 01/30/17 1832 01/31/17 0516  BP: 122/79 125/65 130/68 126/72  Pulse: 89 96 94 91  Resp: 19 18 17 17   Temp:   98.4 F (36.9 C) 99.1 F (37.3 C)  TempSrc:   Oral Oral  SpO2:  99% 99% 100%  Weight:  62.8 kg (138 lb 7.2 oz)  63.5 kg (139 lb 15.9 oz)  Height:       Physical Exam General: Well appearing male, NAD.  Heart: RRR; no murmur Lungs: CTAB Extremities: No LLE edema, R leg in large brace. R arm in sling.  Dialysis Access: L AVG + bruit   Dialysis Orders: MWF [facility] 4h 65kg 2/2 LUA AVF Hep 5000 Venofer 100 mg IV x4HD - 1/4 completed Calcitriol 0.67mcg PO x3wk Phoslo  Assessment: 1. Hx Fall/multiple fractures: S/p ORIF right tibial plat fracture + meniscus repair + ant compartment fasciotomy 10/23 by Dr. Marcelino Scot. Non-surgical Rx of other fractures. CIR now. 2. Hyperkalemia: sp HD last night. Will repeat K today.  3. ESRD: usual HD MWF. Off schedule now. Had HD yest.   4. Anemia of CKD: Hgb 11's, atgoal. Follow, no ESA yet. 5. Secondary hyperparathyroidism - Ca and P in goal. Continue VDRAand binder. 6. HTN/volume: BP stable. no extra vol on exam. Under dry wt 1-2 kg.  7. Nutrition: Alb 2.6, continue Nepro supps. 8. HIV: Well controlled on home meds. 9. Lupus: On prednisone 10 mg/d and plaquenil  Plan - HD tomorrow off schedule  Kelly Splinter MD [employer] pgr [phone]   01/31/2017, 11:32 AM  Additional Objective Labs: Basic Metabolic Panel:  Recent Labs Lab 01/26/17 0500 01/27/17 0550 01/30/17 1556 01/30/17 2202  NA 138 134* 130*  --   K 4.8 4.6 6.6* 5.5*  CL 95* 93* 90*  --   CO2 28 29 21*  --   GLUCOSE 121* 101* 129*  --   BUN 59* 31* 124*  --   CREATININE 9.46* 7.08* 14.62*  --   CALCIUM 8.4* 8.8* 8.8*  --   PHOS 5.0* 4.8* 5.2*  --    Liver Function Tests:  Recent Labs Lab 01/26/17 0500 01/27/17 0550  01/30/17 1556  ALBUMIN 2.4* 2.5* 2.7*    CBC:  Recent Labs Lab 01/25/17 0546 01/26/17 0500 01/27/17 0550 01/30/17 1556  WBC 11.2* 13.0* 12.6* 13.8*  HGB 11.9* 11.2* 11.5* 10.4*  HCT 36.5* 35.2* 35.6* 30.6*  MCV 99.2 98.6 98.9 95.6  PLT 181 218 218 375    CBG:  Recent Labs Lab 01/26/17 1709  GLUCAP 102*   Medications:  . abacavir  600 mg Oral q1800  . amitriptyline  10 mg Oral QHS  . calcitRIOL  0.75 mcg Oral Q M,W,F-HD  . calcium acetate  1,334 mg Oral TID WC  . cholecalciferol  1,000 Units Oral Daily  . dolutegravir  50 mg Oral Q24H  . feeding supplement (NEPRO CARB STEADY)  237 mL Oral TID AC  . heparin subcutaneous  5,000 Units Subcutaneous Q8H  . hydroxychloroquine  200 mg Oral BID  . lamiVUDine  25 mg Oral q1800  . multivitamin with minerals  1 tablet Oral Daily  . polyethylene glycol  17 g Oral BID  . predniSONE  10 mg Oral Daily

## 2017-01-31 NOTE — Progress Notes (Signed)
Occupational Therapy Session Note  Patient Details  Name: Kenneth [name] MRN: [id number] Date of Birth: 11/15/1955  Today's Date: 01/31/2017 OT Individual Time: 8546-2703 OT Individual Time Calculation (min): 34 min    Skilled Therapeutic Interventions/Progress Updates:    Pt worked on sit to stand and static standing balance with use of the RW during session.  Had him work on tossing bean bags in standing with the LUE while maintaining NWBing status in the RLE.  He was able to support himself using the RUE on the walker.  Increased difficulty noted placing and removing UE from the RW once standing.  Noted TDWBing to complete sit to stand transition but then able to maintain NWBing.  Pt finished session sitting in wheelchair at fall festival with therapists present to provide supervision as needed.    Therapy Documentation Precautions:  Precautions Precautions: Fall Restrictions Weight Bearing Restrictions: Yes RUE Weight Bearing: Weight bearing as tolerated RLE Weight Bearing: Non weight bearing Other Position/Activity Restrictions: RUE sling for comfort Pain: Pain Assessment Pain Assessment: Faces Pain Score: 3  Faces Pain Scale: Hurts a little bit Pain Type: Acute pain Pain Location: Shoulder Pain Orientation: Right Pain Descriptors / Indicators: Discomfort Pain Onset: With Activity Pain Intervention(s): Repositioned ADL:  See Function Navigator for Current Functional Status.   Therapy/Group: Individual Therapy  Ausha Sieh OTR/L 01/31/2017, 2:08 PM

## 2017-01-31 NOTE — Progress Notes (Signed)
Physical Therapy Session Note  Patient Details  Name: Kenneth [name] MRN: [id number] Date of Birth: [birth year]  Today's Date: 01/31/2017 PT Individual Time: 1345-1500 PT Individual Time Calculation (min): 75 min   Short Term Goals: Week 1:  PT Short Term Goal 1 (Week 1): STG=LTG due to ELOS  Skilled Therapeutic Interventions/Progress Updates:    no c/o pain at rest, but grimaces with RUE movement/weightbearing.  Session focus on d/c planning and gait training with a variety of devices to determine most appropriate.    Pt greeted in dayroom participating in therapy fall festival.  Pt engaged in conversation regarding upcoming d/c, DME needs, f/u, etc.  W/C propulsion throughout unit with BUEs and LLE with supervision for mobility and activity tolerance.  Gait training x30' with RW and min guard, 3' with hemiwalker and mod assist, and 10' with center mount standard walker with supervision/min guard.  Discussed pros/cons of each and pt determined he felt most comfortable with center mount walker, SW notified.  Pt returned to room at end of session, returned to bed with supervision.  Call bell in reach and needs met.   Therapy Documentation Precautions:  Precautions Precautions: Fall Restrictions Weight Bearing Restrictions: Yes RUE Weight Bearing: Weight bearing as tolerated RLE Weight Bearing: Non weight bearing Other Position/Activity Restrictions: RUE sling for comfort   See Function Navigator for Current Functional Status.   Therapy/Group: Individual Therapy  Michel Santee 01/31/2017, 3:02 PM

## 2017-01-31 NOTE — Progress Notes (Signed)
Physical Therapy Session Note  Patient Details  Name: Kenneth [name] MRN: [id number] Date of Birth: 01-09-1956  Today's Date: 01/31/2017 PT Individual Time: 1130-1200 PT Individual Time Calculation (min): 30 min   Short Term Goals: Week 1:  PT Short Term Goal 1 (Week 1): STG=LTG due to ELOS  Skilled Therapeutic Interventions/Progress Updates:    c/o pain in R shoulder with movement, but does not rate.  Ice applied at end of session.  Session focus on transfers and w/c mobility in simulated home environment.  Pt transfers to EOB to w/c with RW and supervision.  W/C propulsion with supervision on unit.  W/C propulsion weaving through cones to simulate navigating obstacles in home with min verbal cues to attend to obstacles.  Pt returned to room at end of session, upright in w/c, call bell in reach and needs met.   Therapy Documentation Precautions:  Precautions Precautions: Fall Restrictions Weight Bearing Restrictions: Yes RUE Weight Bearing: Weight bearing as tolerated RLE Weight Bearing: Non weight bearing Other Position/Activity Restrictions: RUE sling for comfort   See Function Navigator for Current Functional Status.   Therapy/Group: Individual Therapy  Michel Santee 01/31/2017, 12:09 PM

## 2017-01-31 NOTE — Progress Notes (Signed)
Lab called floor to notifiy of abnormal lab result. K+ remained same as previous note however creatine down from 14.62 to 9.11. Alcide Goodness [facility] notified of results and clarification of Dr. Barkley Bruns note to complete HD tomorrow off schedule. No new orders received Margarito Liner

## 2017-01-31 NOTE — Progress Notes (Signed)
Occupational Therapy Session Note  Patient Details  Name: Kenneth [name] MRN: [id number] Date of Birth: [birth year]  Today's Date: 01/31/2017 OT Individual Time: 1000-1057 OT Individual Time Calculation (min): 57 min   Short Term Goals: Week 1:   STG=LTG 2/2 ELOS  Skilled Therapeutic Interventions/Progress Updates:    OT treatment session focused on modified bathing/dressing, functional ambulation, and improved sit<>stand. Pt ambulated to bathroom with RW and NWB R LE with min guard A and small hops. Pt fatigued quickly but able to make the short distance to transfer into shower. Covered R LE with waterproof bag and pt assisted with taping the top. Provided pt with long-handled sponge for LB bathing. OT reviewed leaning method to wash buttocks which pt was able to do with supervision. Stand-pivot to wc to transfer out of shower w/ grab bars and min guard A. Educated pt on LB dressing techniques using long-handled equipment if needed. Pt utilized reacher to thread R LE into pants, then was able to reach to knee with B UEs to advance further. Close supervision with 1 lateral LOB requiring min A to correct when standing to pull up pants. Pt then donned sock and shoe on L foot with increased time. Pt returned to bed with close supervision and needs met.   Therapy Documentation Precautions:  Precautions Precautions: Fall Restrictions Weight Bearing Restrictions: Yes RUE Weight Bearing: Weight bearing as tolerated RLE Weight Bearing: Non weight bearing Other Position/Activity Restrictions: RUE sling for comfort Pain: Pain Assessment Pain Assessment: 0-10 Pain Score: 3  Pain Type: Surgical pain Pain Location: Knee Pain Orientation: Right Pain Descriptors / Indicators: Aching Pain Onset: With Activity Pain Intervention(s): Repositioned  See Function Navigator for Current Functional Status.   Therapy/Group: Individual Therapy  Valma Cava 01/31/2017, 10:58 AM

## 2017-01-31 NOTE — Patient Care Conference (Signed)
Inpatient RehabilitationTeam Conference and Plan of Care Update Date: 01/31/2017   Time: 2:20 PM    Patient Name: Kenneth [name]      Medical Record Number: [id number]  Date of Birth: 1955-04-17 Sex: Male         Room/Bed: 4M13C/4M13C-01 Payor Info: Payor: [employer] / Plan: [employer] PPO / Product Type: *No Product type* /    Admitting Diagnosis: polytrauma   Admit Date/Time:  01/27/2017  1:42 PM Admission Comments: No comment available   Primary Diagnosis:  <principal problem not specified> Principal Problem: <principal problem not specified>  Patient Active Problem List   Diagnosis Date Noted  . Fracture of right tibial plateau, sequela 01/27/2017  . Laceration of eyebrow and forehead   . Pain   . Fall   . Closed displaced fracture of lateral end of right clavicle   . Closed fracture of right tibial plateau   . Multiple fracture 01/22/2017  . Chronic fatigue 03/11/2015  . Exacerbation of systemic lupus ([place]) 02/24/2015  . Depressed mood 02/18/2015  . Unintentional weight loss 02/18/2015  . S/P dialysis catheter insertion ([place])   . Thrombocytopenia ([place])   . Dysphagia   . ESRD (end stage renal disease) ([place])   . Anasarca 06/22/2014  . DVT (deep venous thrombosis) ([place]) 06/22/2014  . PVC's (premature ventricular contractions) 03/17/2014  . Diarrhea   . Abdominal pain   . Protein-calorie malnutrition, severe ([place]) 02/11/2014  . Lupus nephritis ([place])   . Arthralgia of multiple sites, bilateral 02/02/2014  . Joint swelling 02/01/2014  . Bilateral low back pain without sciatica 02/01/2014  . EXTERNAL HEMORRHOIDS WITHOUT MENTION COMP 01/20/2009  . HIV disease ([place]) 07/24/2006  . Hyperlipidemia 05/31/2006  . ERECTILE DYSFUNCTION 05/31/2006  . INSOMNIA NOS 05/31/2006    Expected Discharge Date: Expected Discharge Date: 02/03/17  Team Members Present: Physician leading conference: Dr. Alysia Penna Social Worker Present: Ovidio Kin, LCSW Nurse  Present: Dorien Chihuahua, RN PT Present: Dwyane Dee, PT OT Present: Cherylynn Ridges, OT SLP Present: Weston Anna, SLP PPS Coordinator present : Daiva Nakayama, RN, CRRN     Current Status/Progress Goal Weekly Team Focus  Medical   . Patient with depressed mood, has pain but is reluctant to take pain medications  , prevent further falls, increase activity tolerance  . Increase activity tolerance, endurance   Bowel/Bladder   continent of bowel; anuric   maintain continence during stay      Swallow/Nutrition/ Hydration             ADL's   Min A overall  Mod I/supervision  Modified bathing/dressing, improved sit<>stand, activity tolerance, pt/family ed   Mobility   supervision for transfers, min guard for gait, mod I w/c  mod I transfers, supervision gait, mod I w/c mobility   d/c planning, activity tolerance, grad day Friday   Communication             Safety/Cognition/ Behavioral Observations            Pain   co complaints of pain         Skin   incision rt lower extremity; sutures intact  continue to heal         *See Care Plan and progress notes for long and short-term goals.     Barriers to Discharge  Current Status/Progress Possible Resolutions Date Resolved   Physician    Inaccessible home environment;Medical stability;Hemodialysis  , poor endurance, likely due to chronic renal disease  ,  progressing towards goals  . Continue rehabilitation program, may need to modified schedule      Nursing                  PT                    OT                  SLP                SW                Discharge Planning/Teaching Needs:  Home with partner who can assist him at home. Pt wants to do all that he can for himself      Team Discussion:  Goals mod/i-supervision level, limited due to WB precautions. Very tired in therapies, could be lupus and ESRD. Pain being managed doesn't like taking pain meds. Medically stable for discharge Sat  Revisions to Treatment  Plan:  DC 11/3    Continued Need for Acute Rehabilitation Level of Care: The patient requires daily medical management by a physician with specialized training in physical medicine and rehabilitation for the following conditions: Daily direction of a multidisciplinary physical rehabilitation program to ensure safe treatment while eliciting the highest outcome that is of practical value to the patient.: Yes Daily medical management of patient stability for increased activity during participation in an intensive rehabilitation regime.: Yes Daily analysis of laboratory values and/or radiology reports with any subsequent need for medication adjustment of medical intervention for : Renal problems;Other  Asencion Guisinger, Gardiner Rhyme 02/02/2017, 9:18 AM

## 2017-01-31 NOTE — Progress Notes (Signed)
Team conference noted today. Pt resting in bed quietly. Easily aroused. Admitted for Tibia Fx with ORIF with drsg and leg brace intact. Without orders to assess actual site noted and communication left for provider to address. Hemodialysis with LUA AVF that is +/+. Able to make needs known. Denies any pain at this time. Will continue to monitor.

## 2017-02-01 ENCOUNTER — Inpatient Hospital Stay (HOSPITAL_COMMUNITY): Payer: BC Managed Care – PPO | Admitting: Physical Therapy

## 2017-02-01 ENCOUNTER — Inpatient Hospital Stay (HOSPITAL_COMMUNITY): Payer: BC Managed Care – PPO | Admitting: Occupational Therapy

## 2017-02-01 LAB — BASIC METABOLIC PANEL
ANION GAP: 13 (ref 5–15)
BUN: 84 mg/dL — ABNORMAL HIGH (ref 6–20)
CALCIUM: 9 mg/dL (ref 8.9–10.3)
CHLORIDE: 95 mmol/L — AB (ref 101–111)
CO2: 26 mmol/L (ref 22–32)
Creatinine, Ser: 11.55 mg/dL — ABNORMAL HIGH (ref 0.61–1.24)
GFR calc non Af Amer: 4 mL/min — ABNORMAL LOW (ref >60)
GFR, EST AFRICAN AMERICAN: 5 mL/min — AB (ref >60)
GLUCOSE: 98 mg/dL (ref 65–99)
POTASSIUM: 6.9 mmol/L — AB (ref 3.5–5.1)
Sodium: 134 mmol/L — ABNORMAL LOW (ref 135–145)

## 2017-02-01 LAB — CBC
HEMATOCRIT: 29.4 % — AB (ref 39.0–52.0)
HEMOGLOBIN: 9.6 g/dL — AB (ref 13.0–17.0)
MCH: 31.7 pg (ref 26.0–34.0)
MCHC: 32.7 g/dL (ref 30.0–36.0)
MCV: 97 fL (ref 78.0–100.0)
Platelets: 382 10*3/uL (ref 150–400)
RBC: 3.03 MIL/uL — AB (ref 4.22–5.81)
RDW: 14.1 % (ref 11.5–15.5)
WBC: 12.1 10*3/uL — ABNORMAL HIGH (ref 4.0–10.5)

## 2017-02-01 MED ORDER — SODIUM CHLORIDE 0.9 % IV SOLN: 100.0000 mL | INTRAVENOUS | Status: DC | PRN

## 2017-02-01 MED ORDER — PENTAFLUOROPROP-TETRAFLUOROETH EX AERO: 1.0000 "application " | INHALATION_SPRAY | CUTANEOUS | Status: DC | PRN

## 2017-02-01 MED ORDER — LIDOCAINE-PRILOCAINE 2.5-2.5 % EX CREA
1.0000 "application " | TOPICAL_CREAM | CUTANEOUS | Status: DC | PRN
  Filled 2017-02-01: qty 5

## 2017-02-01 MED ORDER — LIDOCAINE HCL (PF) 1 % IJ SOLN: 5.0000 mL | INTRAMUSCULAR | Status: DC | PRN

## 2017-02-01 MED ORDER — HEPARIN SODIUM (PORCINE) 1000 UNIT/ML DIALYSIS
2000.0000 [IU] | INTRAMUSCULAR | Status: DC | PRN
  Filled 2017-02-01: qty 2

## 2017-02-01 MED ORDER — ALTEPLASE 2 MG IJ SOLR
2.0000 mg | Freq: Once | INTRAMUSCULAR | Status: DC | PRN
  Filled 2017-02-01: qty 2

## 2017-02-01 MED ORDER — HEPARIN SODIUM (PORCINE) 1000 UNIT/ML DIALYSIS
1000.0000 [IU] | INTRAMUSCULAR | Status: DC | PRN
  Filled 2017-02-01: qty 1

## 2017-02-01 NOTE — Progress Notes (Signed)
Dialysis treatment completed.  2500 mL ultrafiltrated and net fluid removal 2000 mL.    Patient status unchanged. Lung sounds diminished to ausculation in all fields. No edema. Cardiac: NSR.  Disconnected lines and removed needles.  Pressure held for 10 minutes and band aid/gauze dressing applied.  Report given to bedside RN, [name].

## 2017-02-01 NOTE — Discharge Instructions (Signed)
Inpatient Rehab Discharge Instructions  Kenneth [name] Discharge date and time: No discharge date for patient encounter.   Activities/Precautions/ Functional Status: Activity: Nonweightbearing right lower extremity hinged knee brace. Weightbearing as tolerated right upper extremity where shoulder sling for comfort Diet: renal diet Wound Care: keep wound clean and dry Functional status:  ___ No restrictions     ___ Walk up steps independently ___ 24/7 supervision/assistance   ___ Walk up steps with assistance ___ Intermittent supervision/assistance  ___ Bathe/dress independently ___ Walk with walker     _x__ Bathe/dress with assistance ___ Walk Independently    ___ Shower independently ___ Walk with assistance    ___ Shower with assistance ___ No alcohol     ___ Return to work/school ________  Special Instructions: Continue hemodialysis as directed   Follow-up one week with Dr. Marcelino Scot orthopedic services for removal of sutures   COMMUNITY REFERRALS UPON DISCHARGE:    Home Health:   PT, OT, RN  [place] [facility]   Date of last service:02/04/2107   Medical Equipment/Items Ordered:WHEELCHAIR, 3 in 1, [place]  Agency/Supplier:[employer] [place] [facility]   [phone]    My questions have been answered and I understand these instructions. I will adhere to these goals and the provided educational materials after my discharge from the hospital.  Patient/Caregiver Signature _______________________________ Date __________  Clinician Signature _______________________________________ Date __________  Please bring this form and your medication list with you to all your follow-up doctor's appointments.

## 2017-02-01 NOTE — Progress Notes (Signed)
Spoke with Ainsley Spinner physician assistant for Dr. Altamese Lynwood in regards to sutures of right lower extremity and advised follow-up in office in approximately 1 week for removal of sutures

## 2017-02-01 NOTE — Progress Notes (Signed)
Physical Therapy Session Note  Patient Details  Name: Kenneth [name] MRN: [id number] Date of Birth: May 23, 1955  Today's Date: 02/01/2017 PT Individual Time: 1100-1200  PT Individual Time Calculation (min): 60 min   Short Term Goals: Week 1:  PT Short Term Goal 1 (Week 1): STG=LTG due to ELOS  Skilled Therapeutic Interventions/Progress Updates:    no c/o pain at rest, but grimaces with RUE movement.  Session focus on LLE strengthening and RUE ROM, and activity tolerance.   Pt propels w/c to and from day room with L hemi technique and mod I.  PT instructed pt in RUE ROM with stool and UE ranger focus on all planes of movement within pain tolerance.  Pt rested as needed.  Kinetron for LLE activation only at 10 cm/s 2 trials to fatigue for strengthening and endurance.  Pt propelled w/c back to room at end of session and positioned upright with call bell in reach and needs met.   Therapy Documentation Precautions:  Precautions Precautions: Fall Restrictions Weight Bearing Restrictions: Yes RUE Weight Bearing: Weight bearing as tolerated RLE Weight Bearing: Non weight bearing Other Position/Activity Restrictions: RUE sling for comfort   See Function Navigator for Current Functional Status.   Therapy/Group: Individual Therapy  Michel Santee 02/01/2017, 12:00 PM

## 2017-02-01 NOTE — Progress Notes (Signed)
Occupational Therapy Session Note  Patient Details  Name: Kenneth [name] MRN: [id number] Date of Birth: [birth year]  Today's Date: 02/01/2017 OT Individual Time: 0847-1000 OT Individual Time Calculation (min): 73 min    Skilled Therapeutic Interventions/Progress Updates:    Pt greeted with lights off in bed upon OT arrival. Pt easy to awaken and agreeable to OT. Pt declined shower today but agreeable to bathing/dressing at the sink. Practiced bed mobility from flat surface to simulate home environment with supervision. Squat-pivot transfer to wc on L side with wc set-up only. Pt propelled wc to the sink, dofffed clothing using reacher to push pants off of R brace. Bathing completed with increased time and modified strategies-overall set-up/supervision assist.. Pt able to reach with R UE today to wash under L arm. Pt able to recall dressing strategies using long-handled devices without cues. Supervision for balance when pulling up pants but no overt LOB.  Discussed home set-up and completed tub shower transfer with supervision. Pt however does not want to pay out of pocket for showering DME and states he will sponge bathe for now. Discussed simple meal prep and refrigerator access from wc level, pt demonstrated understanding. Pt returned to room, set-up wc for transfer and completed squat-pivot back to bed without assistance. Pt left with needs met.   Therapy Documentation Precautions:  Precautions Precautions: Fall Restrictions Weight Bearing Restrictions: Yes RUE Weight Bearing: Weight bearing as tolerated RLE Weight Bearing: Non weight bearing Other Position/Activity Restrictions: RUE sling for comfort Other Treatments:    See Function Navigator for Current Functional Status.   Therapy/Group: Individual Therapy  Valma Cava 02/01/2017, 9:08 AM

## 2017-02-01 NOTE — Progress Notes (Signed)
Physical Therapy Session Note  Patient Details  Name: Kenneth [name] MRN: [id number] Date of Birth: May 05, 1955  Today's Date: 02/01/2017 PT Concurrent Time: 1300-1400 PT Concurrent Time Calculation (min): 60 min  Short Term Goals: Week 1:  PT Short Term Goal 1 (Week 1): STG=LTG due to ELOS  Skilled Therapeutic Interventions/Progress Updates:    no c/o pain at rest, grimaces with RUE movement and weight bearing.  Session focus on strengthening, flexibility, gait, and w/c positioning.    Pt propels w/c throughout unit mod I.  Transfers supervision/mod I.  PT adjusted pt's w/c leg rests for improved positioning and sitting tolerance.  RLE AAROM with pillowcase x2 minutes.  LAQ with 3# weight on LLE and no weight on RLE 2x10 reps.  Gait training x15' with increased time and standard walker, min guard to supervision.  Pt returned to room at end of session and positioned back in bed with supervision.  Call bell in reach and needs met.   Therapy Documentation Precautions:  Precautions Precautions: Fall Restrictions Weight Bearing Restrictions: Yes RUE Weight Bearing: Weight bearing as tolerated RLE Weight Bearing: Non weight bearing Other Position/Activity Restrictions: RUE sling for comfort   See Function Navigator for Current Functional Status.   Therapy/Group: Individual Therapy  Michel Santee 02/01/2017, 2:17 PM

## 2017-02-01 NOTE — Progress Notes (Signed)
Critical lab value received: Potassium of 6.9.  Nephrology notified.  Will continue to monitor.

## 2017-02-01 NOTE — Progress Notes (Signed)
[facility]ILITATION     PROGRESS NOTE    Subjective/Complaints:  No issues overnite Discussed d/c date  ROS: pt denies nausea, vomiting, diarrhea, cough, shortness of breath or chest pain   Objective: Vital Signs: Blood pressure 121/69, pulse 86, temperature 98.5 F (36.9 C), temperature source Oral, resp. rate 18, height 6\' 1"  (1.854 m), weight 63.5 kg (139 lb 15.9 oz), SpO2 100 %. No results found.  Recent Labs  01/30/17 1556  WBC 13.8*  HGB 10.4*  HCT 30.6*  PLT 375    Recent Labs  01/30/17 1556 01/30/17 2202 01/31/17 1157  NA 130*  --  136  K 6.6* 5.5* 5.5*  CL 90*  --  95*  GLUCOSE 129*  --  90  BUN 124*  --  57*  CREATININE 14.62*  --  9.11*  CALCIUM 8.8*  --  9.0   CBG (last 3)  No results for input(s): GLUCAP in the last 72 hours.  Wt Readings from Last 3 Encounters:  01/31/17 63.5 kg (139 lb 15.9 oz)  01/26/17 59.7 kg (131 lb 9.8 oz)  07/05/16 66.9 kg (147 lb 6.4 oz)    Physical Exam:  Constitutional: He appears well-developedand well-nourished. No distress.  HENT:  Head: Normocephalic.  Eyes: EOMare normal.  Neck: Normal range of motion. Neck supple. No thyromegalypresent.  Cardiovascular: RRR without murmur. No JVD  Respiratory: CTA Bilaterally without wheezes or rales. Normal effort   GI: Soft. Bowel sounds are normal. He exhibits no distension. There is no tenderness. There is no rebound.  Skin. Right lower extremity dressed.  Musc: Right distal clavicle tender and RLE tender with basic movement/ROM Neurological: He is alertand oriented to person, place, and time. No CN abnl RUE and RLE with pain inhibition. LUE and LLE 4/5 proximal to distal. Sensory function intact in all 4's. Cognitively intact Psych: flat but appropriate  Assessment/Plan: 1. Decreased functional mobility secondary to polytrauma which require 3+ hours per day of interdisciplinary therapy in a comprehensive inpatient rehab  setting. Physiatrist is providing close team supervision and 24 hour management of active medical problems listed below. Physiatrist and rehab team continue to assess barriers to discharge/monitor patient progress toward functional and medical goals.  Function:  Bathing Bathing position   Position: Shower  Bathing parts Body parts bathed by patient: Right arm, Left arm, Chest, Abdomen, Front perineal area, Buttocks, Left upper leg, Left lower leg, Back Body parts bathed by helper: Back  Bathing assist Assist Level: Set up, Supervision or verbal cues, Assistive device Assistive Device Comment: Long handled sponge    Upper Body Dressing/Undressing Upper body dressing   What is the patient wearing?: Pull over shirt/dress     Pull over shirt/dress - Perfomed by patient: Thread/unthread right sleeve, Thread/unthread left sleeve Pull over shirt/dress - Perfomed by helper: Put head through opening, Pull shirt over trunk        Upper body assist Assist Level: Touching or steadying assistance(Pt > 75%)      Lower Body Dressing/Undressing Lower body dressing   What is the patient wearing?: Pants     Pants- Performed by patient: Thread/unthread right pants leg, Thread/unthread left pants leg, Pull pants up/down Pants- Performed by helper: Thread/unthread right pants leg, Pull pants up/down       Socks - Performed by helper: Don/doff right sock, Don/doff left sock              Lower body assist Assist for lower body dressing: Touching  or steadying assistance (Pt > 75%)      Toileting Toileting Toileting activity did not occur: No continent bowel/bladder event   Toileting steps completed by helper: Adjust clothing prior to toileting, Performs perineal hygiene, Adjust clothing after toileting (per Irven Shelling, NT)    Toileting assist     Transfers Chair/bed transfer   Chair/bed transfer method: Stand pivot Chair/bed transfer assist level: Supervision or verbal  cues Chair/bed transfer assistive device: Armrests, Walker, Orthosis     Locomotion Ambulation     Max distance: 20 ft Assist level: Touching or steadying assistance (Pt > 75%)   Wheelchair   Type: Manual Max wheelchair distance: 150 Assist Level: No help, No cues, assistive device, takes more than reasonable amount of time  Cognition Comprehension Comprehension assist level: Follows complex conversation/direction with no assist  Expression Expression assist level: Expresses complex ideas: With no assist  Social Interaction Social Interaction assist level: Interacts appropriately with others - No medications needed.  Problem Solving Problem solving assist level: Solves complex problems: With extra time  Memory  Memory assist level: Recognizes or recalls 90% of the time/requires cueing < 10% of the time  Medical Problem List and Plan: 1. Decreased functional mobilitysecondary to fall sustaining right clavicle fracture, right scapular fracture and right tibia fibula fracture-status post ORIF. Nonweightbearing right lower extremity with hinge knee brace. Weightbearing as tolerated right upper extremity with sling for comfort  CIR PT, OT,? D/C in am vs 11/3 depending on HD schedule 2. DVT Prophylaxis/Anticoagulation: Subcutaneous heparin. Check vascular study 3. Pain Management: Elavil 10 mg daily at bedtime, hydrocodone as needed, cont current meds 4. Mood: Provide emotional support 5. Neuropsych: This patient iscapable of making decisions on hisown behalf. 6. Skin/Wound Care: Routine skin checks 7. Fluids/Electrolytes/Nutrition: I 716ml Enc fluids 8.ESRD. Continue hemodialysis as per renal services  Elevated K  now resolved 9.HIV. 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

## 2017-02-01 NOTE — Progress Notes (Signed)
Patient arrived to unit per bed.  Reviewed treatment plan and this RN agrees.  Report received from bedside RN, Marjorie Smolder.  Consent verified.  Patient A & O X 4. Lung sounds diminished to ausculation in all fields. RLE non pitting edema. Cardiac: NSR.  Prepped LUAVF with alcohol and cannulated with two 15 gauge needles.  Pulsation of blood noted.  Flushed access well with saline per protocol.  Connected and secured lines and initiated tx at 1620.  UF goal of 2500 mL and net fluid removal of 2000 mL.  Will continue to monitor.

## 2017-02-01 NOTE — Progress Notes (Signed)
Pt resting in bed quietly. Easily aroused. Denies any pain at this time. bm this shift large after assist to commode. LUE AVF +/+. Right leg continues within the ortho brace and ace wrap is noted. Able to make needs known. Uneventful night tonight. Safety maintained, callnell within reach. Will continue to monitor.

## 2017-02-01 NOTE — Progress Notes (Signed)
  [facility] Progress Note   Subjective: no c/o.   Objective Vitals:   01/31/17 0516 01/31/17 1539 02/01/17 0559 02/01/17 1440  BP: 126/72 (!) 113/54 121/69 125/72  Pulse: 91 (!) 102 86 85  Resp: 17 16 18 18   Temp: 99.1 F (37.3 C) 98.6 F (37 C) 98.5 F (36.9 C) 98.3 F (36.8 C)  TempSrc: Oral Oral Oral Oral  SpO2: 100% 100% 100% 96%  Weight: 63.5 kg (139 lb 15.9 oz)     Height:       Physical Exam General: Well appearing male, NAD.  Heart: RRR; no murmur Lungs: CTAB Extremities: No LLE edema, R leg in large brace. R arm in sling.  Dialysis Access: L AVG + bruit   Dialysis Orders: MWF [facility] 4h 65kg 2/2 LUA AVF Hep 5000 Venofer 100 mg IV x4HD - 1/4 completed Calcitriol 0.39mcg PO x3wk Phoslo  Assessment: 1. Hx Fall/multiple fractures: S/p ORIF right tibial plat fracture + meniscus repair + ant compartment fasciotomy 10/23 by Dr. Marcelino Scot. Non-surgical Rx of other fractures. CIR now. 2. Hyperkalemia: repeat today on HD 3. ESRD: usual HD MWF. Off schedule now, HD today.    4. Anemia of CKD: Hgb 11's, atgoal. Follow, no ESA yet. 5. Secondary hyperparathyroidism - Ca and P in goal. Continue VDRAand binder. 6. HTN/volume: BP stable. no extra vol on exam. Under dry wt 1-2 kg.  7. Nutrition: Alb 2.6, continue Nepro supps. 8. HIV: Well controlled on home meds. 9. Lupus: On prednisone 10 mg/d and plaquenil  Plan - HD today off schedule, recheck K+, low K bath  Kelly Splinter MD [employer] pgr [phone]   01/31/2017, 11:32 AM  Additional Objective Labs: Basic Metabolic Panel:  Recent Labs Lab 01/26/17 0500 01/27/17 0550 01/30/17 1556 01/30/17 2202 01/31/17 1157  NA 138 134* 130*  --  136  K 4.8 4.6 6.6* 5.5* 5.5*  CL 95* 93* 90*  --  95*  CO2 28 29 21*  --  28  GLUCOSE 121* 101* 129*  --  90  BUN 59* 31* 124*  --  57*  CREATININE 9.46* 7.08* 14.62*  --  9.11*  CALCIUM 8.4* 8.8* 8.8*  --  9.0  PHOS 5.0* 4.8* 5.2*  --    --    Liver Function Tests:  Recent Labs Lab 01/26/17 0500 01/27/17 0550 01/30/17 1556  ALBUMIN 2.4* 2.5* 2.7*    CBC:  Recent Labs Lab 01/26/17 0500 01/27/17 0550 01/30/17 1556  WBC 13.0* 12.6* 13.8*  HGB 11.2* 11.5* 10.4*  HCT 35.2* 35.6* 30.6*  MCV 98.6 98.9 95.6  PLT 218 218 375    CBG:  Recent Labs Lab 01/26/17 1709  GLUCAP 102*   Medications:  . abacavir  600 mg Oral q1800  . amitriptyline  10 mg Oral QHS  . calcitRIOL  0.75 mcg Oral Q M,W,F-HD  . calcium acetate  1,334 mg Oral TID WC  . cholecalciferol  1,000 Units Oral Daily  . dolutegravir  50 mg Oral Q24H  . feeding supplement (NEPRO CARB STEADY)  237 mL Oral TID AC  . heparin subcutaneous  5,000 Units Subcutaneous Q8H  . hydroxychloroquine  200 mg Oral BID  . lamiVUDine  25 mg Oral q1800  . multivitamin with minerals  1 tablet Oral Daily  . polyethylene glycol  17 g Oral BID  . predniSONE  10 mg Oral Daily

## 2017-02-02 ENCOUNTER — Inpatient Hospital Stay (HOSPITAL_COMMUNITY): Payer: BC Managed Care – PPO | Admitting: Occupational Therapy

## 2017-02-02 ENCOUNTER — Inpatient Hospital Stay (HOSPITAL_COMMUNITY): Payer: BC Managed Care – PPO | Admitting: Physical Therapy

## 2017-02-02 LAB — BASIC METABOLIC PANEL
Anion gap: 13 (ref 5–15)
BUN: 42 mg/dL — ABNORMAL HIGH (ref 6–20)
CALCIUM: 9.2 mg/dL (ref 8.9–10.3)
CO2: 25 mmol/L (ref 22–32)
CREATININE: 7.72 mg/dL — AB (ref 0.61–1.24)
Chloride: 94 mmol/L — ABNORMAL LOW (ref 101–111)
GFR, EST AFRICAN AMERICAN: 8 mL/min — AB (ref >60)
GFR, EST NON AFRICAN AMERICAN: 7 mL/min — AB (ref >60)
Glucose, Bld: 123 mg/dL — ABNORMAL HIGH (ref 65–99)
Potassium: 5.2 mmol/L — ABNORMAL HIGH (ref 3.5–5.1)
SODIUM: 132 mmol/L — AB (ref 135–145)

## 2017-02-02 MED ORDER — CALCITRIOL 0.25 MCG PO CAPS: 0.7500 ug | ORAL_CAPSULE | ORAL | 0 refills | Status: DC

## 2017-02-02 MED ORDER — AMITRIPTYLINE HCL 10 MG PO TABS
10.0000 mg | ORAL_TABLET | Freq: Every day | ORAL | 0 refills | Status: DC
Start: 2017-02-02 — End: 2018-09-29

## 2017-02-02 MED ORDER — PREDNISONE 10 MG PO TABS: 10.0000 mg | ORAL_TABLET | Freq: Every day | ORAL | 0 refills | Status: DC

## 2017-02-02 MED ORDER — DARBEPOETIN ALFA 100 MCG/0.5ML IJ SOSY
100.0000 ug | PREFILLED_SYRINGE | INTRAMUSCULAR | Status: DC
  Administered 2017-02-03: 100 ug via INTRAVENOUS
  Filled 2017-02-02: qty 0.5

## 2017-02-02 MED ORDER — CHOLECALCIFEROL 25 MCG (1000 UT) PO TABS: 1000.0000 [IU] | ORAL_TABLET | Freq: Every day | ORAL | 0 refills | Status: DC

## 2017-02-02 MED ORDER — CALCIUM ACETATE (PHOS BINDER) 667 MG PO CAPS: 1334.0000 mg | ORAL_CAPSULE | Freq: Three times a day (TID) | ORAL | 0 refills | Status: DC

## 2017-02-02 MED ORDER — HYDROCODONE-ACETAMINOPHEN 7.5-325 MG PO TABS: 2.0000 | ORAL_TABLET | ORAL | 0 refills | Status: DC | PRN

## 2017-02-02 MED ORDER — HYDROXYCHLOROQUINE SULFATE 200 MG PO TABS: 200.0000 mg | ORAL_TABLET | Freq: Two times a day (BID) | ORAL | 0 refills | Status: DC

## 2017-02-02 NOTE — Progress Notes (Signed)
Occupational Therapy Discharge Summary  Patient Details  Name: Kenneth [name] MRN: [id number] Date of Birth: 1955/11/18  Today's Date: 02/02/2017 OT Individual Time: 6226-3335 OT Individual Time Calculation (min): 58 min   Treatment session focused on community reintegration, dc planning, and transfers. Pt transferred bec>wc mod I. Discussed home modifications and DME needs again and pt reports he will await HHOT for evaluation and recommended home bathroom equipment and plans to sponge bathe in the meantime. Pt provided wc gloves for grip and skin protection, then propelled wc to elevators. Educated pt on safe elevator access and community safety awareness from wc level. Worked on wc propulsion on graded incline and concrete+tile surfaces. Pt reports no other questions or concerns for OT and feels he is ready for dc home tomorrow. Pt returned to bed mod I and was left semi-reclined with needs met.   Patient has met 8 of 8 long term goals due to improved activity tolerance, improved balance, postural control and ability to compensate for deficits.  Patient to discharge at overall Modified Independent level.  Patient's care partner is independent to provide the necessary physical assistance at discharge.    Reasons goals not met: n/a  Recommendation:  Patient will benefit from ongoing skilled OT services in home health setting to continue to advance functional skills in the area of BADL.  Equipment: w/c (see PT note) 3-in-1 BSC  Reasons for discharge: treatment goals met and discharge from hospital  Patient/family agrees with progress made and goals achieved: Yes  OT Discharge Precautions/Restrictions  Precautions Precautions: Fall Required Braces or Orthoses: Knee Immobilizer - Right Knee Immobilizer - Right: On at all times Restrictions Weight Bearing Restrictions: Yes RUE Weight Bearing: Weight bearing as tolerated RLE Weight Bearing: Non weight bearing Other Position/Activity  Restrictions: RUE sling for comfort Pain Pain Assessment Pain Assessment: 0-10 Pain Score: 4  Pain Type: Acute pain Pain Orientation: Right Pain Descriptors / Indicators: Aching Pain Onset: With Activity Pain Intervention(s): Repositioned ADL ADL Equipment Provided: Reacher, Long-handled sponge Eating: Independent Grooming: Independent Upper Body Bathing: Modified independent Lower Body Bathing: Modified independent Upper Body Dressing: Modified independent (Device) Lower Body Dressing: Modified independent Toileting: Modified independent Toilet Transfer: Modified independent Tub/Shower Transfer: Distant supervision ADL Comments: Please see functional navigator Perception  Perception: Within Functional Limits Praxis Praxis: Intact Cognition Overall Cognitive Status: Within Functional Limits for tasks assessed Arousal/Alertness: Awake/alert Orientation Level: Oriented X4 Sensation Sensation Light Touch: Appears Intact Proprioception: Appears Intact Coordination Gross Motor Movements are Fluid and Coordinated: No Fine Motor Movements are Fluid and Coordinated: No Motor  Motor Motor: Other (comment) Motor - Skilled Clinical Observations: R LE weakness secondary to fx, R shoulder weakness secondary to fx. L LE/UE WFL Motor - Discharge Observations: RUE pain with ROM, RLE NWB Mobility  Bed Mobility Bed Mobility: Supine to Sit;Sit to Supine Supine to Sit: 6: Modified independent (Device/Increase time) Sit to Supine: 6: Modified independent (Device/Increase time) Transfers Sit to Stand: 6: Modified independent (Device/Increase time) Stand to Sit: 6: Modified independent (Device/Increase time)  Trunk/Postural Assessment  Cervical Assessment Cervical Assessment: Within Functional Limits Thoracic Assessment Thoracic Assessment: Within Functional Limits Lumbar Assessment Lumbar Assessment: Within Functional Limits Postural Control Postural Control: Within Functional  Limits  Balance Balance Balance Assessed: Yes Static Sitting Balance Static Sitting - Level of Assistance: 7: Independent Dynamic Sitting Balance Dynamic Sitting - Level of Assistance: 6: Modified independent (Device/Increase time) Static Standing Balance Static Standing - Level of Assistance: 6: Modified independent (Device/Increase time) Dynamic Standing Balance Dynamic  Standing - Level of Assistance: 5: Stand by assistance Extremity/Trunk Assessment RUE Assessment RUE Assessment: Exceptions to WFL (Incresed AROM but limited by pain 2/2 fx) LUE Assessment LUE Assessment: Within Functional Limits   See Function Navigator for Current Functional Status.  Daneen Schick Lindzy Rupert 02/02/2017, 2:55 PM

## 2017-02-02 NOTE — Progress Notes (Signed)
[facility] Progress Note   Subjective:   Seen in room. Just finished up with PT. For discharge tomorrow, so plan is for HD today to get back on MWF schedule. No new complaints today.  Objective Vitals:   02/01/17 1930 02/01/17 1950 02/01/17 1955 02/02/17 0500  BP: 123/81 127/82 (!) 143/83 117/67  Pulse: 99 100 100 98  Resp:  16  18  Temp:  98 F (36.7 C)  99.8 F (37.7 C)  TempSrc:    Oral  SpO2:    97%  Weight:  62.9 kg (138 lb 10.7 oz)    Height:       Physical Exam General: Well appearing male, NAD.  Heart: RRR; no murmur Lungs: CTAB Extremities: No LLE edema, R leg in large brace. R arm in sling.  Dialysis Access: L AVG + bruit  Additional Objective Labs: Basic Metabolic Panel:  Recent Labs Lab 01/27/17 0550 01/30/17 1556 01/30/17 2202 01/31/17 1157 02/01/17 1621  NA 134* 130*  --  136 134*  K 4.6 6.6* 5.5* 5.5* 6.9*  CL 93* 90*  --  95* 95*  CO2 29 21*  --  28 26  GLUCOSE 101* 129*  --  90 98  BUN 31* 124*  --  57* 84*  CREATININE 7.08* 14.62*  --  9.11* 11.55*  CALCIUM 8.8* 8.8*  --  9.0 9.0  PHOS 4.8* 5.2*  --   --   --    Liver Function Tests:  Recent Labs Lab 01/27/17 0550 01/30/17 1556  ALBUMIN 2.5* 2.7*   CBC:  Recent Labs Lab 01/27/17 0550 01/30/17 1556 02/01/17 1621  WBC 12.6* 13.8* 12.1*  HGB 11.5* 10.4* 9.6*  HCT 35.6* 30.6* 29.4*  MCV 98.9 95.6 97.0  PLT 218 375 382   BCBG:  Recent Labs Lab 01/26/17 1709  GLUCAP 102*   Medications:  . abacavir  600 mg Oral q1800  . amitriptyline  10 mg Oral QHS  . calcitRIOL  0.75 mcg Oral Q M,W,F-HD  . calcium acetate  1,334 mg Oral TID WC  . cholecalciferol  1,000 Units Oral Daily  . dolutegravir  50 mg Oral Q24H  . feeding supplement (NEPRO CARB STEADY)  237 mL Oral TID AC  . heparin subcutaneous  5,000 Units Subcutaneous Q8H  . hydroxychloroquine  200 mg Oral BID  . lamiVUDine  25 mg Oral q1800  . multivitamin with minerals  1 tablet Oral Daily  .  polyethylene glycol  17 g Oral BID  . predniSONE  10 mg Oral Daily    Dialysis Orders: MWF [facility] 4h 65kg 2/2 LUA AVF Hep 5000 Venofer 100 mg IV x4HD - 1/4 completed Calcitriol 0.63mcg PO x3wk Phoslo  Assessment: 1. Hx Fall/multiple fractures: S/p ORIF right tibial plat fracture + meniscus repair + ant compartment fasciotomy 10/23 by Dr. Marcelino Scot. Non-surgical Rx of other fractures. 2. Hyperkalemia: repeat today on HD 3. ESRD: Back to MWF schedule, for HD today. 4. Anemia of CKD: Hgb down to 9.6, will give Aranesp 160mcg today. 5. Secondary hyperparathyroidism - Ca and P in goal. Continue VDRAand binder. 6. HTN/volume: BP stable. no extra vol on exam. Under dry wt 1-2 kg.  7. Nutrition: Alb 2.6, continue Nepro supps. 8. HIV: Well controlled on home meds. 9. Lupus: On prednisone 10 mg/d and plaquenil  Veneta Penton, PA-C 02/02/2017, 2:51 PM  [facility] Pager: [phone]  Pt seen, examined and agree w A/P as above.  Kelly Splinter MD [employer]  pager [phone]   02/03/2017, 11:03 AM

## 2017-02-02 NOTE — Discharge Summary (Signed)
NAME:  Kenneth [name], Kenneth [name] NO.:  [id number]  MEDICAL RECORD NO.:  [id number]  LOCATION:  [id number]                        FACILITY:  [facility]  PHYSICIAN:  Charlett Blake, M.D.DATE OF BIRTH:  29-May-1955  DATE OF ADMISSION:  01/27/2017 DATE OF DISCHARGE:  02/03/2017                              DISCHARGE SUMMARY   DISCHARGE DIAGNOSES: 1. Right clavicle fracture, right scapular fracture, and right tibia-     fibular fracture with ORIF after a fall. 2. Subcutaneous heparin for DVT prophylaxis, pain management. 3. End-stage renal disease with hemodialysis. 4. Human immunodeficiency virus. 5. Lupus nephritis.  HISTORY OF PRESENT ILLNESS:  This is a 61 year old right-handed male with history of end-stage renal disease, hypertension, HIV.  Lives with significant other, independent prior to admission.  Presented January 22, 2017, after mechanical fall, brief loss of consciousness.  Cranial CT scan negative.  CT cervical spine negative.  X-rays and imaging revealed right distal clavicle fracture, right scapular fracture, as well as right tibia plateau fracture.  Underwent ORIF, lateral tibial plateau fracture, arthrotomy with meniscus repair, anterior compartment fasciotomy, January 23, 2017, per Dr. Marcelino Scot.  Hospital course, pain management.  Hemodialysis ongoing.  Nonweightbearing, right lower extremity with hinged knee brace.  Weightbearing as tolerated, right upper extremity with sling for comfort.  Subcutaneous heparin for DVT prophylaxis.  The patient was admitted for a comprehensive rehab program.  PAST MEDICAL HISTORY:  See discharge diagnoses.  SOCIAL HISTORY:  Lives with significant other.  Independent prior to admission.  Functional status upon admission to [facility] was minimal assist, rolling walker, to ambulate 1 foot, +2 safety sit to stand, mod max assist activities of daily living.  PHYSICAL EXAMINATION:  VITAL SIGNS:  Blood pressure 111/65, pulse  98, temperature 98, respirations 18. GENERAL:  This was an alert male, in no acute distress. HEENT:  EOMs intact. NECK:  Supple.  Nontender.  No JVD. CARDIAC:  Rate controlled. ABDOMEN:  Soft, nontender.  Good bowel sounds. LUNGS:  Clear to auscultation without wheeze. EXTREMITIES:  Right lower extremity dressed with sutures in place.  REHABILITATION HOSPITAL COURSE:  The patient was admitted to Inpatient Rehab Services.  Therapies initiated on a 3-hour daily basis consisting of physical therapy, occupational therapy, and rehabilitation nursing. The following issues were addressed during the patient's rehab stay. Pertaining to Kenneth [name] right clavicle fracture, right scapular fracture, and right tibia fracture, he had undergone ORIF.  He was nonweightbearing right lower extremity with hinged knee brace. Weightbearing as tolerated right upper extremity with sling for comfort. He would follow up with Orthopedic Service with Dr. Marcelino Scot.  Sutures would remain in place to right lower extremity.  Subcutaneous heparin for DVT prophylaxis.  No bleeding episodes.  Pain management with use of Elavil as well as hydrocodone as needed.  He remained on hemodialysis as per Renal Services.  He would continue his antivirals as prior to admission for history of HIV.  Lupus nephritis, on chronic prednisone as well as Plaquenil.  The patient received weekly collaborative interdisciplinary team conferences to discuss estimated length of stay, family teaching, any barriers to discharge.  He propels his wheelchair throughout the unit modified independent.  Transfers supervision, modified independent.  Maintained his weightbearing precautions. Working with energy conservation techniques.  Needing some assistance for lower body activities of daily living as well as upper body due to scapular clavicle fracture.  Discussed home setting and completed tub shower transfers with supervision.  Full family teaching  was completed. Plan, discharge to home.  DISCHARGE MEDICATIONS:  Included: 1. Ziagen 600 mg p.o. daily. 2. Elavil 10 mg p.o. at bedtime. 3. Rocaltrol 0.75 mcg Monday, Wednesday, Friday hemodialysis. 4. PhosLo 1334 mg p.o. t.i.d. 5. Vitamin D 1000 units p.o. daily. 6. Tivicay 50 mg p.o. daily. 7. Plaquenil 200 mg p.o. b.i.d. 8. Epivir 25 mg p.o. daily. 9. Multivitamin daily. 10.MiraLAX twice daily. 11.Prednisone 10 mg p.o. daily. 12.Hydrocodone 1-2 tablets every 4 hours as needed for pain.  DIET:  A renal diet.  FOLLOWUP:  The patient would follow up with Dr. Alysia Penna as needed; Dr. Altamese Chamberlayne, call for appointment; Dr. Jamal Maes, ongoing hemodialysis; Dr. Nicolette Bang, medical management; Dr. Bobby Rumpf, call for appointment.  The patient is nonweightbearing right lower extremity with hinged knee brace.  Weightbearing as tolerated right upper extremity with shoulder sling for comfort.  Also, the patient would follow up in 1 week with Dr. Marcelino Scot for removal of sutures, right lower extremity.     Lauraine Rinne, P.A.   ______________________________ Charlett Blake, M.D.    DA/MEDQ  D:  02/02/2017  T:  02/02/2017  Job:  [id number]  cc:   Elzie Rings. Lorrene Reid, M.D. Astrid Divine. Marcelino Scot, M.D. [name]. [name], M.D. Charlett Blake, M.D. Dr. Nicolette Bang

## 2017-02-02 NOTE — Progress Notes (Signed)
Pt resting in bed quietly. Easily aroused. C/o sternal pain when coughing. Refuses tylenol and was educated that we will inform provider if he would benefit from a muscle relaxer depending on clearance ability. Able to make needs known. Safety maintained. Call bell within reach. Will continue to monitor.

## 2017-02-02 NOTE — Progress Notes (Signed)
Kenneth [name] PHYSICAL MEDICINE & REHABILITATION     PROGRESS NOTE    Subjective/Complaints: No new issues overnite, HD yesterday, elevated K yesterday ? If another HD treatment is planned for tomorrow  ROS: pt denies nausea, vomiting, diarrhea, cough, shortness of breath or chest pain   Objective: Vital Signs: Blood pressure 117/67, pulse 98, temperature 99.8 F (37.7 C), temperature source Oral, resp. rate 18, height 6\' 1"  (1.854 m), weight 62.9 kg (138 lb 10.7 oz), SpO2 97 %. No results found.  Recent Labs  01/30/17 1556 02/01/17 1621  WBC 13.8* 12.1*  HGB 10.4* 9.6*  HCT 30.6* 29.4*  PLT 375 382    Recent Labs  01/31/17 1157 02/01/17 1621  NA 136 134*  K 5.5* 6.9*  CL 95* 95*  GLUCOSE 90 98  BUN 57* 84*  CREATININE 9.11* 11.55*  CALCIUM 9.0 9.0   CBG (last 3)  No results for input(s): GLUCAP in the last 72 hours.  Wt Readings from Last 3 Encounters:  02/01/17 62.9 kg (138 lb 10.7 oz)  01/26/17 59.7 kg (131 lb 9.8 oz)  07/05/16 66.9 kg (147 lb 6.4 oz)    Physical Exam:  Constitutional: He appears well-developedand well-nourished. No distress.  HENT:  Head: Normocephalic.  Eyes: EOMare normal.  Neck: Normal range of motion. Neck supple. No thyromegalypresent.  Cardiovascular: RRR without murmur. No JVD  Respiratory: CTA Bilaterally without wheezes or rales. Normal effort   GI: Soft. Bowel sounds are normal. He exhibits no distension. There is no tenderness. There is no rebound.  Skin. Right lower extremity dressed.  Musc: Right distal clavicle tender and RLE tender with basic movement/ROM Neurological: He is alertand oriented to person, place, and time. No CN abnl RUE and RLE with pain inhibition. LUE and LLE 4/5 proximal to distal. Sensory function intact in all 4's. Cognitively intact Psych: flat but appropriate  Assessment/Plan: 1. Decreased functional mobility secondary to polytrauma which require 3+ hours per day of interdisciplinary therapy in  a comprehensive inpatient rehab setting. Physiatrist is providing close team supervision and 24 hour management of active medical problems listed below. Physiatrist and rehab team continue to assess barriers to discharge/monitor patient progress toward functional and medical goals.  Function:  Bathing Bathing position   Position: Wheelchair/chair at sink  Bathing parts Body parts bathed by patient: Right arm, Left arm, Chest, Abdomen, Front perineal area, Buttocks, Left upper leg, Left lower leg, Back Body parts bathed by helper: Back  Bathing assist Assist Level: More than reasonable time Assistive Device Comment: Long handled sponge    Upper Body Dressing/Undressing Upper body dressing   What is the patient wearing?: Pull over shirt/dress     Pull over shirt/dress - Perfomed by patient: Thread/unthread right sleeve, Thread/unthread left sleeve, Put head through opening, Pull shirt over trunk Pull over shirt/dress - Perfomed by helper: Put head through opening, Pull shirt over trunk        Upper body assist Assist Level: Supervision or verbal cues      Lower Body Dressing/Undressing Lower body dressing   What is the patient wearing?: Pants     Pants- Performed by patient: Thread/unthread right pants leg, Thread/unthread left pants leg, Pull pants up/down Pants- Performed by helper: Thread/unthread right pants leg, Pull pants up/down       Socks - Performed by helper: Don/doff right sock, Don/doff left sock              Lower body assist Assist for lower body dressing: Supervision  or verbal cues      Naval architect activity did not occur: No continent bowel/bladder event   Toileting steps completed by helper: Adjust clothing prior to toileting, Performs perineal hygiene, Adjust clothing after toileting Toileting Assistive Devices: Grab bar or rail  Toileting assist Assist level: Touching or steadying assistance (Pt.75%)   Transfers Chair/bed  transfer   Chair/bed transfer method: Stand pivot Chair/bed transfer assist level:  (pivots on left leg) Chair/bed transfer assistive device: Bedrails, Other (w/c)     Locomotion Ambulation     Max distance: 20 ft Assist level: Touching or steadying assistance (Pt > 75%)   Wheelchair   Type: Manual Max wheelchair distance: 150 Assist Level: Touching or steadying assistance (Pt > 75%)  Cognition Comprehension Comprehension assist level: Follows complex conversation/direction with no assist  Expression Expression assist level: Expresses complex ideas: With no assist  Social Interaction Social Interaction assist level: Interacts appropriately with others - No medications needed.  Problem Solving Problem solving assist level: Solves complex problems: With extra time  Memory  Memory assist level: Recognizes or recalls 90% of the time/requires cueing < 10% of the time  Medical Problem List and Plan: 1. Decreased functional mobilitysecondary to fall sustaining right clavicle fracture, right scapular fracture and right tibia fibula fracture-status post ORIF. Nonweightbearing right lower extremity with hinge knee brace. Weightbearing as tolerated right upper extremity with sling for comfort  CIR PT, OT,? D/C this pm vs   11/3 depending on HD schedule No PMR f/u needed  2. DVT Prophylaxis/Anticoagulation: Subcutaneous heparin. Check vascular study 3. Pain Management: Elavil 10 mg daily at bedtime, hydrocodone as needed, cont current meds 4. Mood: Provide emotional support 5. Neuropsych: This patient iscapable of making decisions on hisown behalf. 6. Skin/Wound Care: Routine skin checks 7. Fluids/Electrolytes/Nutrition: I 790ml Enc fluids 8.ESRD. Continue hemodialysis as per renal services  Elevated K  now resolved 9.HIV. 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

## 2017-02-02 NOTE — Progress Notes (Signed)
Occupational Therapy Session Note  Patient Details  Name: Kenneth [name] MRN: [id number] Date of Birth: 02-Jan-1956  Today's Date: 02/02/2017 OT Individual Time: 1829-9371 OT Individual Time Calculation (min): 58 min    Short Term Goals: Week 1:    Week 2:     Skilled Therapeutic Interventions/Progress Updates:    1:1 Grad day.  Pt will primary be bathing at sink level at home.  Performed all self care tasks at mod I level from w/c. Pt does require extra time due to pain in right shoulder. Pt reports his partner will be at home to assist as needed.   After bathing and dressing; discuss with pt about the shower chair in his room and reports he does need it.  And then reports he might want to take a shower before he is allowed to weight bear through his LE. In the apartment with a simulated setup of his shower stall situation pt unable to hop over ledge/ threshold and his plan of preforming w/c into shower transfer was not safe.  Discussed and practiced tub bench transfer into shower stall.  Pt encouraged to continue to problem solve and discuss with primary therapist in next OT session. Pt returned to his room propelling himself mod I.  Therapy Documentation Precautions:  Precautions Precautions: Fall Required Braces or Orthoses: Knee Immobilizer - Right Knee Immobilizer - Right: On at all times Restrictions Weight Bearing Restrictions: Yes RUE Weight Bearing: Weight bearing as tolerated RLE Weight Bearing: Non weight bearing Other Position/Activity Restrictions: RUE sling for comfort Pain: Pain Assessment Pain Assessment: 0-10 Pain Score: 4  Pain Type: Acute pain Pain Orientation: Right UE Pain Descriptors / Indicators: Aching Pain Onset: With Activity Pain Intervention(s): Repositioned ADL: ADL Equipment Provided: Reacher, Long-handled sponge Eating: Independent Grooming: Independent Upper Body Bathing: Modified independent Lower Body Bathing: Modified independent Upper  Body Dressing: Modified independent (Device) Lower Body Dressing: Modified independent Toileting: Modified independent Toilet Transfer: Modified independent Tub/Shower Transfer: Distant supervision ADL Comments: Please see functional navigator  See Function Navigator for Current Functional Status.   Therapy/Group: Individual Therapy  [name] 02/02/2017, 3:00 PM

## 2017-02-02 NOTE — Progress Notes (Signed)
Social Work  Discharge Note  The overall goal for the admission was met for:   Discharge location: Yes-HOME WITH ROBERT WHO CAN ASSIST HIM.  Length of Stay: Yes-7 DAYS  Discharge activity level: Yes-MOD/I-WHEELCHAIR & SUPERVISION-AMBULATION  Home/community participation: Yes  Services provided included: MD, RD, PT, OT, RN, CM, Pharmacy and SW  Financial Services: Private Insurance: [place]  Follow-up services arranged: Home Health: [facility]-PT,OT,RN, DME: [place] 3 IN 1 and Patient/Family has no preference for HH/DME agencies  Comments (or additional information):PT LIMITED DUE TO HIS WB ISSUES. ROBERT IS THERE AND CAN ASSIST HIM WITH HIS CARE.  Patient/Family verbalized understanding of follow-up arrangements: Yes  Individual responsible for coordination of the follow-up plan: SELF & ROBERT-PARTNER  Confirmed correct DME delivered: Elease Hashimoto 02/02/2017    Elease Hashimoto

## 2017-02-02 NOTE — Progress Notes (Signed)
Physical Therapy Discharge Summary  Patient Details  Name: Kenneth [name] MRN: [id number] Date of Birth: [birth year]  Today's Date: 02/02/2017 PT Individual Time: 1000-1100 PT Individual Time Calculation (min): 60 min    Patient has met 6 of 6 long term goals due to improved activity tolerance, improved balance, increased strength and decreased pain.  Patient to discharge at a wheelchair level mod I, supervision level for short distance ambulation.    Recommendation:  Patient will benefit from ongoing skilled PT services in home health setting to continue to advance safe functional mobility, address ongoing impairments in balance, activity tolerance, pain, and strength, and minimize fall risk.  Equipment: w/c and standard walker  Reasons for discharge: treatment goals met  Patient/family agrees with progress made and goals achieved: Yes   Skilled PT Intervention: Session focus on d/c assessment, activity tolerance, and HEP.  Pt transfers throughout session mod I with and with walker.  Car transfer with supervision, discussed sitting in back seat with RLE propped on bench seat.  Gait x25' with standard walker and close supervision, discussed having someone present if ambulating in the home.  Discussed f/u with HHPT at d/c.  PT provided pt with HEP and pt returned demo x12 reps for R knee AROM, LAQ, and hip flexion.  W/C propulsion throughout unit mod I with L hemi technique, intermittent use of RUE.  Pt returned to room at end of session, heat applied to R shoulder for pain, left upright in w/c with call bell in reach and needs met.   PT Discharge Precautions/Restrictions Precautions Precautions: Fall Required Braces or Orthoses: Knee Immobilizer - Right Knee Immobilizer - Right: On at all times Restrictions Weight Bearing Restrictions: Yes RUE Weight Bearing: Weight bearing as tolerated RLE Weight Bearing: Non weight bearing Pain Pain Assessment Pain Assessment: 0-10 Pain Score:  6  Pain Type: Acute pain Pain Location: Shoulder Pain Orientation: Right Pain Descriptors / Indicators: Grimacing Pain Intervention(s): Repositioned;Heat applied;Emotional support Vision/Perception  Perception Perception: Within Functional Limits Praxis Praxis: Intact  Cognition Overall Cognitive Status: Within Functional Limits for tasks assessed Arousal/Alertness: Awake/alert Orientation Level: Oriented X4 Attention: Divided Divided Attention: Appears intact Memory: Appears intact Awareness: Appears intact Problem Solving: Appears intact Safety/Judgment: Appears intact Sensation Sensation Light Touch: Appears Intact Proprioception: Appears Intact Coordination Gross Motor Movements are Fluid and Coordinated: No Fine Motor Movements are Fluid and Coordinated: No Motor  Motor Motor: Other (comment) Motor - Discharge Observations: RUE pain with ROM, RLE NWB  Mobility Bed Mobility Bed Mobility: Supine to Sit;Sit to Supine Supine to Sit: 6: Modified independent (Device/Increase time) Sit to Supine: 6: Modified independent (Device/Increase time) Transfers Transfers: Yes Sit to Stand: 6: Modified independent (Device/Increase time) Stand to Sit: 6: Modified independent (Device/Increase time) Locomotion  Ambulation Ambulation: Yes Ambulation/Gait Assistance: 5: Supervision Ambulation Distance (Feet): 25 Feet Assistive device: Standard walker Gait Gait: No Stairs / Additional Locomotion Stairs: No Wheelchair Mobility Wheelchair Mobility: Yes Wheelchair Assistance: 6: Modified independent (Device/Increase time) Wheelchair Propulsion: Both upper extremities;Left lower extremity Distance: 150  Trunk/Postural Assessment  Cervical Assessment Cervical Assessment: Within Functional Limits Thoracic Assessment Thoracic Assessment: Within Functional Limits Lumbar Assessment Lumbar Assessment: Within Functional Limits Postural Control Postural Control: Within Functional  Limits  Balance Balance Balance Assessed: Yes Static Sitting Balance Static Sitting - Level of Assistance: 7: Independent Dynamic Sitting Balance Dynamic Sitting - Level of Assistance: 6: Modified independent (Device/Increase time) Static Standing Balance Static Standing - Level of Assistance: 6: Modified independent (Device/Increase time) Dynamic Standing Balance Dynamic  Standing - Level of Assistance: 5: Stand by assistance Extremity Assessment      RLE Assessment RLE Assessment: Exceptions to [facility] RLE AROM (degrees) RLE Overall AROM Comments: hip/knee somewhat limited by pain  RLE Strength RLE Overall Strength Comments: not formally assessed 2/2 NWB restriction LLE Assessment LLE Assessment: Within Functional Limits   See Function Navigator for Current Functional Status.  Michel Santee 02/02/2017, 2:04 PM

## 2017-02-02 NOTE — Discharge Summary (Signed)
Discharge summary job [id number]

## 2017-02-03 MED ORDER — ALTEPLASE 2 MG IJ SOLR: 2.0000 mg | Freq: Once | INTRAMUSCULAR | Status: DC | PRN

## 2017-02-03 MED ORDER — HEPARIN SODIUM (PORCINE) 1000 UNIT/ML DIALYSIS: 3000.0000 [IU] | Freq: Once | INTRAMUSCULAR | Status: DC

## 2017-02-03 MED ORDER — LIDOCAINE HCL (PF) 1 % IJ SOLN: 5.0000 mL | INTRAMUSCULAR | Status: DC | PRN

## 2017-02-03 MED ORDER — SODIUM CHLORIDE 0.9 % IV SOLN: 100.0000 mL | INTRAVENOUS | Status: DC | PRN

## 2017-02-03 MED ORDER — LIDOCAINE-PRILOCAINE 2.5-2.5 % EX CREA: 1.0000 "application " | TOPICAL_CREAM | CUTANEOUS | Status: DC | PRN

## 2017-02-03 MED ORDER — DARBEPOETIN ALFA 100 MCG/0.5ML IJ SOSY
PREFILLED_SYRINGE | INTRAMUSCULAR | Status: AC
  Filled 2017-02-03: qty 0.5

## 2017-02-03 MED ORDER — HEPARIN SODIUM (PORCINE) 1000 UNIT/ML DIALYSIS: 1000.0000 [IU] | INTRAMUSCULAR | Status: DC | PRN

## 2017-02-03 MED ORDER — CALCITRIOL 0.25 MCG PO CAPS
ORAL_CAPSULE | ORAL | Status: AC
  Filled 2017-02-03: qty 3

## 2017-02-03 MED ORDER — PENTAFLUOROPROP-TETRAFLUOROETH EX AERO: 1.0000 "application " | INHALATION_SPRAY | CUTANEOUS | Status: DC | PRN

## 2017-02-03 NOTE — Progress Notes (Signed)
Kenneth [name] is a 61 y.o. male [birth year] [id number]  Subjective: No new complaints. ?when sutures on RLE will be removed (placed at surg 10/23) 0 other DC instructions reviewed - feels well and ready to go when family arrives  Objective: Vital signs in last 24 hours: Temp:  [98.3 F (36.8 C)-98.7 F (37.1 C)] 98.7 F (37.1 C) (11/03 0500) Pulse Rate:  [76-97] 97 (11/03 0500) Resp:  [13-18] 18 (11/03 0500) BP: (105-134)/(68-99) 122/79 (11/03 0500) SpO2:  [95 %-100 %] 100 % (11/03 0500) Weight:  [62.5 kg (137 lb 12.6 oz)-62.9 kg (138 lb 10.7 oz)] 62.9 kg (138 lb 10.7 oz) (11/03 0500) Weight change: -2.4 kg (-5 lb 4.7 oz) Last BM Date: 02/01/17  Intake/Output from previous day: 11/02 0701 - 11/03 0700 In: 720 [P.O.:720] Out: 0   Physical Exam General: No apparent distress    Lungs: Normal effort. Lungs clear to auscultation, no crackles or wheezes. Cardiovascular: Regular rate and rhythm, no edema Wounds: RLE  Clean, dry, intact. No signs of infection.  Lab Results: BMET    Component Value Date/Time   NA 132 (L) 02/02/2017 1415   K 5.2 (H) 02/02/2017 1415   CL 94 (L) 02/02/2017 1415   CO2 25 02/02/2017 1415   GLUCOSE 123 (H) 02/02/2017 1415   BUN 42 (H) 02/02/2017 1415   CREATININE 7.72 (H) 02/02/2017 1415   CREATININE 7.94 (H) 05/16/2016 1054   CALCIUM 9.2 02/02/2017 1415   CALCIUM 8.2 (L) 01/24/2017 0359   GFRNONAA 7 (L) 02/02/2017 1415   GFRNONAA 7 (L) 05/16/2016 1054   GFRAA 8 (L) 02/02/2017 1415   GFRAA 8 (L) 05/16/2016 1054   CBC    Component Value Date/Time   WBC 12.1 (H) 02/01/2017 1621   RBC 3.03 (L) 02/01/2017 1621   HGB 9.6 (L) 02/01/2017 1621   HCT 29.4 (L) 02/01/2017 1621   PLT 382 02/01/2017 1621   MCV 97.0 02/01/2017 1621   MCH 31.7 02/01/2017 1621   MCHC 32.7 02/01/2017 1621   RDW 14.1 02/01/2017 1621   LYMPHSABS 1.5 01/21/2017 2248   MONOABS 0.9 01/21/2017 2248   EOSABS 0.0 01/21/2017 2248   BASOSABS 0.0 01/21/2017 2248   CBG's (last  3):  No results for input(s): GLUCAP in the last 72 hours. LFT's Lab Results  Component Value Date   ALT 17 01/21/2017   AST 28 01/21/2017   ALKPHOS 108 01/21/2017   BILITOT 0.6 01/21/2017    Studies/Results: No results found.  Medications:  I have reviewed the patient's current medications. Scheduled Medications: . abacavir  600 mg Oral q1800  . amitriptyline  10 mg Oral QHS  . calcitRIOL  0.75 mcg Oral Q M,W,F-HD  . calcium acetate  1,334 mg Oral TID WC  . cholecalciferol  1,000 Units Oral Daily  . darbepoetin (ARANESP) injection - DIALYSIS  100 mcg Intravenous Q Fri-HD  . dolutegravir  50 mg Oral Q24H  . feeding supplement (NEPRO CARB STEADY)  237 mL Oral TID AC  . heparin subcutaneous  5,000 Units Subcutaneous Q8H  . hydroxychloroquine  200 mg Oral BID  . lamiVUDine  25 mg Oral q1800  . multivitamin with minerals  1 tablet Oral Daily  . polyethylene glycol  17 g Oral BID  . predniSONE  10 mg Oral Daily   PRN Medications: acetaminophen **OR** acetaminophen, docusate sodium, HYDROcodone-acetaminophen  Assessment/Plan: Active Problems:   ESRD (end stage renal disease) (HCC)   Fracture of right tibial plateau, sequela   Length of  stay, days: 7  Stable for DC home today as planned - see DC Summary; no changes Ok for suture removal next week at only 10d today post op - trauma ortho follow up and PCP follow up planned for next week as well as HH to eval on Monday  Kenneth [name] A. Asa Lente, MD 02/03/2017, 11:14 AM

## 2017-02-03 NOTE — Progress Notes (Signed)
02/03/17 1356 nursing Patient getting ready to be discharged to home per wheelchair accompanied by son and NT. Per patient discharge instructions was done yesterday and no questions at this time just ready to go home.

## 2017-02-06 DIAGNOSIS — R609 Edema, unspecified: Secondary | ICD-10-CM

## 2017-02-06 NOTE — Progress Notes (Signed)
Finally did this.  Sorry Tia.   Thanks, Linna Hoff

## 2017-02-09 ENCOUNTER — Encounter: Payer: Self-pay | Admitting: Internal Medicine

## 2017-02-09 ENCOUNTER — Ambulatory Visit: Payer: BC Managed Care – PPO | Admitting: Internal Medicine

## 2017-02-09 VITALS — BP 123/80 | HR 107 | Temp 98.2°F | Wt 145.4 lb

## 2017-02-09 DIAGNOSIS — Z87310 Personal history of (healed) osteoporosis fracture: Secondary | ICD-10-CM

## 2017-02-09 DIAGNOSIS — M8580 Other specified disorders of bone density and structure, unspecified site: Secondary | ICD-10-CM

## 2017-02-09 DIAGNOSIS — Z09 Encounter for follow-up examination after completed treatment for conditions other than malignant neoplasm: Secondary | ICD-10-CM | POA: Diagnosis not present

## 2017-02-09 MED ORDER — TRAZODONE HCL 50 MG PO TABS: 25.0000 mg | ORAL_TABLET | Freq: Every evening | ORAL | 3 refills | Status: DC | PRN

## 2017-02-09 NOTE — Progress Notes (Signed)
   Subjective:    Kenneth [name] - 61 y.o. male MRN [id number]  Date of birth: 1955-07-02  HPI  Kenneth MISIASZEK is here for hospital follow up. Patient was admitted on 10/22 for multiple fractures secondary to mechanical fall. In ED xrays showed acute, comminuted, intra-articular fx of the distal clavicle extending to acromioclavicular joint, fracture of glenoid fossa, Right comminuted fx of lateral tibial plateau extending into metadiaphysis, and small fx of lateral fibular head. CT showed no intracranial injury or fracture and no fracture or subluxation along cervical spine. Orthopedics was consulted and fixation occurred on 10/23 with Dr. Marcelino Scot and NWB for 8 weeks. Patient was discharged to [facility] on 10/27. Discharged from [facility] on 11/2. Patient presents here for follow up and removal of his sutures post-operatively.    -  reports that  has never smoked. he has never used smokeless tobacco. - Review of Systems: Per HPI. - Past Medical History: Patient Active Problem List   Diagnosis Date Noted  . Swelling   . Fracture of right tibial plateau, sequela 01/27/2017  . Laceration of eyebrow and forehead   . Pain   . Fall   . Closed displaced fracture of lateral end of right clavicle   . Closed fracture of right tibial plateau   . Multiple fracture 01/22/2017  . Chronic fatigue 03/11/2015  . Exacerbation of systemic lupus ([place]) 02/24/2015  . Depressed mood 02/18/2015  . Unintentional weight loss 02/18/2015  . S/P dialysis catheter insertion ([place])   . Thrombocytopenia ([place])   . Dysphagia   . ESRD (end stage renal disease) ([place])   . Anasarca 06/22/2014  . DVT (deep venous thrombosis) ([place]) 06/22/2014  . PVC's (premature ventricular contractions) 03/17/2014  . Diarrhea   . Abdominal pain   . Protein-calorie malnutrition, severe ([place]) 02/11/2014  . Lupus nephritis ([place])   . Arthralgia of multiple sites, bilateral 02/02/2014  . Joint swelling 02/01/2014  . Bilateral low back pain without  sciatica 02/01/2014  . EXTERNAL HEMORRHOIDS WITHOUT MENTION COMP 01/20/2009  . HIV disease ([place]) 07/24/2006  . Hyperlipidemia 05/31/2006  . ERECTILE DYSFUNCTION 05/31/2006  . INSOMNIA NOS 05/31/2006   - Medications: reviewed and updated   Objective:   Physical Exam BP 123/80 (BP Location: Right Arm, Patient Position: Sitting, Cuff Size: Normal)   Pulse (!) 107   Temp 98.2 F (36.8 C) (Oral)   Wt 145 lb 6.4 oz (66 kg)   SpO2 99%   BMI 19.18 kg/m  Gen: NAD, alert, cooperative with exam, well-appearing Skin: Right lower extremity in leg brace and dressing. When removed, well healing incision extending down lateral right lower leg without drainage or surrounding erythema or increased warmth. Approximately 8 sutures removed from leg without difficulty. Three sutures removed from above right eyebrow without difficulty.      Assessment & Plan:   1. Hospital discharge follow-up Patient doing well after hospital discharge. Sutures removed today without difficulty. Patient to continue to remain NWB. Instructed him to get follow up with Dr. Marcelino Scot (orthopedics). Have ordered DEXA scan due to multiple fractures obtained during fall from short distance. There is concern for osteopenia or osteoporosis.    Phill Myron, D.O. 02/15/2017, 9:18 AM PGY-3, [place]

## 2017-02-09 NOTE — Patient Instructions (Addendum)
Please call Dr. Carlean Jews office at  [phone] for follow up. You will need to have post-operative follow up with him since he was the doctor that did your surgery. We have ordered a bone density scan to evaluate for thinning of your bones that may make it easier for you to break bones.

## 2017-02-13 ENCOUNTER — Telehealth: Payer: Self-pay | Admitting: *Deleted

## 2017-02-13 ENCOUNTER — Other Ambulatory Visit: Payer: Self-pay | Admitting: Internal Medicine

## 2017-02-13 MED ORDER — TRAZODONE HCL 50 MG PO TABS: 25.0000 mg | ORAL_TABLET | Freq: Every evening | ORAL | 3 refills | Status: DC | PRN

## 2017-02-13 NOTE — Telephone Encounter (Signed)
Patient call again about status of medications (see mychart message from 11/9).

## 2017-02-13 NOTE — Progress Notes (Signed)
I have sent Trazodone for sleep to [facility] pharmacy.  Phill Myron, D.O. 02/13/2017, 4:46 PM PGY-3, [place]

## 2017-02-13 NOTE — Telephone Encounter (Signed)
Pt informed that medicine was called in for sleep. Kenneth [name], [name], [place]

## 2017-02-15 ENCOUNTER — Other Ambulatory Visit: Payer: BC Managed Care – PPO

## 2017-02-15 DIAGNOSIS — B20 Human immunodeficiency virus [HIV] disease: Secondary | ICD-10-CM

## 2017-02-15 LAB — COMPREHENSIVE METABOLIC PANEL
AG RATIO: 0.8 (calc) — AB (ref 1.0–2.5)
ALBUMIN MSPROF: 3.4 g/dL — AB (ref 3.6–5.1)
ALKALINE PHOSPHATASE (APISO): 179 U/L — AB (ref 40–115)
ALT: 6 U/L — ABNORMAL LOW (ref 9–46)
AST: 18 U/L (ref 10–35)
BUN / CREAT RATIO: 3 (calc) — AB (ref 6–22)
BUN: 17 mg/dL (ref 7–25)
CHLORIDE: 98 mmol/L (ref 98–110)
CO2: 30 mmol/L (ref 20–32)
CREATININE: 5.56 mg/dL — AB (ref 0.70–1.33)
Calcium: 9 mg/dL (ref 8.6–10.3)
GLOBULIN: 4.2 g/dL — AB (ref 1.9–3.7)
Glucose, Bld: 79 mg/dL (ref 65–99)
POTASSIUM: 4.5 mmol/L (ref 3.5–5.3)
Sodium: 141 mmol/L (ref 135–146)
Total Bilirubin: 0.5 mg/dL (ref 0.2–1.2)
Total Protein: 7.6 g/dL (ref 6.1–8.1)

## 2017-02-15 LAB — CBC
HEMATOCRIT: 33.1 % — AB (ref 38.5–50.0)
Hemoglobin: 10.9 g/dL — ABNORMAL LOW (ref 13.2–17.1)
MCH: 30.9 pg (ref 27.0–33.0)
MCHC: 32.9 g/dL (ref 32.0–36.0)
MCV: 93.8 fL (ref 80.0–100.0)
MPV: 10.8 fL (ref 7.5–12.5)
PLATELETS: 275 10*3/uL (ref 140–400)
RBC: 3.53 10*6/uL — ABNORMAL LOW (ref 4.20–5.80)
RDW: 13 % (ref 11.0–15.0)
WBC: 9.8 10*3/uL (ref 3.8–10.8)

## 2017-02-16 LAB — T-HELPER CELL (CD4) - (RCID CLINIC ONLY)
CD4 % Helper T Cell: 33 % (ref 33–55)
CD4 T CELL ABS: 700 /uL (ref 400–2700)

## 2017-02-19 LAB — HIV-1 RNA QUANT-NO REFLEX-BLD
HIV 1 RNA QUANT: NOT DETECTED {copies}/mL
HIV-1 RNA Quant, Log: <1.3 Log copies/mL

## 2017-03-01 ENCOUNTER — Ambulatory Visit (INDEPENDENT_AMBULATORY_CARE_PROVIDER_SITE_OTHER): Payer: BC Managed Care – PPO | Admitting: Infectious Diseases

## 2017-03-01 ENCOUNTER — Encounter: Payer: Self-pay | Admitting: Infectious Diseases

## 2017-03-01 VITALS — BP 149/81 | HR 112 | Temp 98.6°F | Wt 138.0 lb

## 2017-03-01 DIAGNOSIS — N186 End stage renal disease: Secondary | ICD-10-CM

## 2017-03-01 DIAGNOSIS — S82141S Displaced bicondylar fracture of right tibia, sequela: Secondary | ICD-10-CM | POA: Diagnosis not present

## 2017-03-01 DIAGNOSIS — Z79899 Other long term (current) drug therapy: Secondary | ICD-10-CM

## 2017-03-01 DIAGNOSIS — Z113 Encounter for screening for infections with a predominantly sexual mode of transmission: Secondary | ICD-10-CM | POA: Diagnosis not present

## 2017-03-01 DIAGNOSIS — B20 Human immunodeficiency virus [HIV] disease: Secondary | ICD-10-CM | POA: Diagnosis not present

## 2017-03-01 NOTE — Assessment & Plan Note (Signed)
He is doing well Has f/u next week.

## 2017-03-01 NOTE — Assessment & Plan Note (Signed)
He appears to be doing well Appreciate [facility] (Deterding) f/u as well as [facility] eval.

## 2017-03-01 NOTE — Progress Notes (Signed)
   Subjective:    Patient ID: Kenneth [name], male    DOB: 1956/02/08, 61 y.o.   MRN: [id number]  HPI 61 yo M with HIV/AIDS on DTGV/3TV/ABC. He is also ESRD also hx of SLE. He was seen by [facility] June 2016 and had AVF. 01-23-17 ORIF R tibial plateau fracture. He also fractured his R shoulder )no surgery).   He has had f/u at [facility] for renal txp. Still in process.  No problems with ART.  No problems at HD. No problems with fistula.   HIV 1 RNA Quant (copies/mL)  Date Value  02/15/2017 <20 NOT DETECTED  05/16/2016 <20 NOT DETECTED  09/02/2015 <20   CD4 T Cell Abs (/uL)  Date Value  02/15/2017 700  05/16/2016 320 (L)  09/02/2015 420    Review of Systems  Constitutional: Positive for unexpected weight change. Negative for appetite change.  Respiratory: Negative for shortness of breath.   Cardiovascular: Negative for leg swelling.  Gastrointestinal: Negative for constipation and diarrhea.  Genitourinary: Positive for decreased urine volume.  Psychiatric/Behavioral: Positive for sleep disturbance.  Please see HPI. All other systems reviewed and negative.     Objective:   Physical Exam  Constitutional: He appears well-developed and well-nourished.  HENT:  Mouth/Throat: No oropharyngeal exudate.  Eyes: EOM are normal. Pupils are equal, round, and reactive to light.  Neck: Neck supple.  Cardiovascular: Normal rate, regular rhythm and normal heart sounds.  Pulmonary/Chest: Effort normal and breath sounds normal.  Abdominal: Soft. Bowel sounds are normal. There is no tenderness. There is no rebound.  Musculoskeletal: He exhibits no edema.       Legs: Lymphadenopathy:    He has no cervical adenopathy.      Assessment & Plan:

## 2017-03-01 NOTE — Assessment & Plan Note (Signed)
He appears to be doing well.  No change in art Will check his Hep B S Ab to see if he resonded to vax, needs repeat vax series? Got PNVX update at [facility] this week. Has gotten flu.  rtc in 6 months

## 2017-03-06 ENCOUNTER — Ambulatory Visit
Admission: RE | Admit: 2017-03-06 | Discharge: 2017-03-06 | Disposition: A | Discharge status: Home / Self Care | Payer: BC Managed Care – PPO | Source: Ambulatory Visit | Attending: Family Medicine | Admitting: Family Medicine

## 2017-03-06 DIAGNOSIS — M8580 Other specified disorders of bone density and structure, unspecified site: Secondary | ICD-10-CM

## 2017-03-06 DIAGNOSIS — Z87310 Personal history of (healed) osteoporosis fracture: Secondary | ICD-10-CM

## 2017-03-15 ENCOUNTER — Encounter: Payer: Self-pay | Admitting: Internal Medicine

## 2017-03-15 ENCOUNTER — Ambulatory Visit (INDEPENDENT_AMBULATORY_CARE_PROVIDER_SITE_OTHER): Payer: BC Managed Care – PPO | Admitting: Internal Medicine

## 2017-03-15 ENCOUNTER — Other Ambulatory Visit: Payer: Self-pay | Admitting: Infectious Diseases

## 2017-03-15 ENCOUNTER — Other Ambulatory Visit: Payer: Self-pay

## 2017-03-15 VITALS — BP 122/80 | HR 85 | Temp 98.3°F | Ht 73.0 in | Wt 137.0 lb

## 2017-03-15 DIAGNOSIS — M810 Age-related osteoporosis without current pathological fracture: Secondary | ICD-10-CM | POA: Diagnosis not present

## 2017-03-15 DIAGNOSIS — B20 Human immunodeficiency virus [HIV] disease: Secondary | ICD-10-CM

## 2017-03-15 NOTE — Progress Notes (Signed)
   Subjective:    Kenneth [name] - 61 y.o. male MRN [id number]  Date of birth: March 12, 1956  HPI  Kenneth [name] is here for follow up of DEXA scan results. Bone density scan obtained after patient admitted to the hospital for several fractures secondary to a mechanical fall. Found to have T score of -3.3 in right femur. Patient reports that he is healing well from his fall. Ambulating with assistance of one crutch now. He has no concerns today.   -  reports that  has never smoked. he has never used smokeless tobacco. - Review of Systems: Per HPI. - Past Medical History: Patient Active Problem List   Diagnosis Date Noted  . Osteoporosis 03/15/2017  . Swelling   . Fracture of right tibial plateau, sequela 01/27/2017  . Laceration of eyebrow and forehead   . Pain   . Fall   . Closed displaced fracture of lateral end of right clavicle   . Closed fracture of right tibial plateau   . Multiple fracture 01/22/2017  . Chronic fatigue 03/11/2015  . Exacerbation of systemic lupus ([place]) 02/24/2015  . Depressed mood 02/18/2015  . Unintentional weight loss 02/18/2015  . Thrombocytopenia ([place])   . Dysphagia   . ESRD (end stage renal disease) ([place])   . Anasarca 06/22/2014  . DVT (deep venous thrombosis) ([place]) 06/22/2014  . PVC's (premature ventricular contractions) 03/17/2014  . Diarrhea   . Abdominal pain   . Protein-calorie malnutrition, severe ([place]) 02/11/2014  . Lupus nephritis ([place])   . Arthralgia of multiple sites, bilateral 02/02/2014  . Joint swelling 02/01/2014  . Bilateral low back pain without sciatica 02/01/2014  . EXTERNAL HEMORRHOIDS WITHOUT MENTION COMP 01/20/2009  . HIV disease ([place]) 07/24/2006  . Hyperlipidemia 05/31/2006  . ERECTILE DYSFUNCTION 05/31/2006  . INSOMNIA NOS 05/31/2006   - Medications: reviewed and updated   Objective:   Physical Exam BP 122/80   Pulse 85   Temp 98.3 F (36.8 C) (Oral)   Ht 6\' 1"  (1.854 m)   Wt 137 lb (62.1 kg)   SpO2 99%   BMI  18.07 kg/m  Gen: NAD, alert, cooperative with exam, chronically frail appearance  Neuro: ambulates with assistance of crutch  Psych: good insight, alert and oriented     Assessment & Plan:   Osteoporosis Patient found to have osteoporosis on bone density scan obtained after he suffered several fractures secondary to mechanical fall. Screened positive with T score of -3.3. Unfortunately, given his ESRD bisphosphonate therapy is not an option for this patient.  Will contact patient's Nephrologist Dr. Jimmy Footman to discuss if other treatment options are available. Recommended light weight bearing exercises such as a walking regimen.    Phill Myron, D.O. 03/15/2017, 9:59 AM PGY-3, [place]

## 2017-03-15 NOTE — Patient Instructions (Signed)
Ask Dr. Jimmy Footman to look out for the letter from Dr. Juleen China and ask about potential treatment options for osteoporosis.    Osteoporosis Osteoporosis happens when your bones become thinner and weaker. Weak bones can break (fracture) more easily when you slip or fall. Bones most at risk of breaking are in the hip, wrist, and spine. Follow these instructions at home:  Get enough calcium and vitamin D. These nutrients are good for your bones.  Exercise as told by your doctor.  Do not use any tobacco products. This includes cigarettes, chewing tobacco, and electronic cigarettes. If you need help quitting, ask your doctor.  Limit the amount of alcohol you drink.  Take medicines only as told by your doctor.  Keep all follow-up visits as told by your doctor. This is important.  Take care at home to prevent falls. Some ways to do this are: ? Keep rooms well lit and tidy. ? Put safety rails on your stairs. ? Put a rubber mat in the bathroom and other places that are often wet or slippery. Get help right away if:  You fall.  You hurt yourself. This information is not intended to replace advice given to you by your health care provider. Make sure you discuss any questions you have with your health care provider. Document Released: 06/12/2011 Document Revised: 08/26/2015 Document Reviewed: 08/28/2013 Elsevier Interactive Patient Education  [employer].

## 2017-03-15 NOTE — Assessment & Plan Note (Addendum)
Patient found to have osteoporosis on bone density scan obtained after he suffered several fractures secondary to mechanical fall. Screened positive with T score of -3.3. Unfortunately, given his ESRD bisphosphonate therapy is not an option for this patient.  Will contact patient's Nephrologist Dr. Jimmy Footman to discuss if other treatment options are available. Recommended light weight bearing exercises such as a walking regimen.

## 2017-03-16 ENCOUNTER — Other Ambulatory Visit: Payer: Self-pay | Admitting: *Deleted

## 2017-03-16 DIAGNOSIS — B20 Human immunodeficiency virus [HIV] disease: Secondary | ICD-10-CM

## 2017-03-16 MED ORDER — ABACAVIR SULFATE 300 MG PO TABS: 600.0000 mg | ORAL_TABLET | Freq: Every day | ORAL | 5 refills | Status: DC

## 2017-03-16 MED ORDER — DOLUTEGRAVIR SODIUM 50 MG PO TABS: 50.0000 mg | ORAL_TABLET | Freq: Every day | ORAL | 5 refills | Status: DC

## 2017-03-16 MED ORDER — LAMIVUDINE 10 MG/ML PO SOLN: 25.0000 mg | Freq: Every day | ORAL | 5 refills | Status: DC

## 2017-05-22 ENCOUNTER — Encounter: Payer: Self-pay | Admitting: Internal Medicine

## 2017-07-17 ENCOUNTER — Telehealth: Payer: Self-pay | Admitting: Infectious Diseases

## 2017-07-17 DIAGNOSIS — B20 Human immunodeficiency virus [HIV] disease: Secondary | ICD-10-CM

## 2017-07-17 NOTE — Telephone Encounter (Signed)
Called by [facility] txp ID.  He had VL 282, CD4 900.  His kidney txp until undetectable.  They have asked for repeat HIV RNA, archival HIV RNA

## 2017-07-18 ENCOUNTER — Encounter: Payer: Self-pay | Admitting: Internal Medicine

## 2017-07-18 ENCOUNTER — Other Ambulatory Visit: Payer: BC Managed Care – PPO

## 2017-07-18 DIAGNOSIS — Z79899 Other long term (current) drug therapy: Secondary | ICD-10-CM

## 2017-07-18 DIAGNOSIS — B20 Human immunodeficiency virus [HIV] disease: Secondary | ICD-10-CM

## 2017-07-18 DIAGNOSIS — Z113 Encounter for screening for infections with a predominantly sexual mode of transmission: Secondary | ICD-10-CM

## 2017-07-19 LAB — COMPREHENSIVE METABOLIC PANEL
AG Ratio: 1 (calc) (ref 1.0–2.5)
ALBUMIN MSPROF: 3.5 g/dL — AB (ref 3.6–5.1)
ALT: 16 U/L (ref 9–46)
AST: 16 U/L (ref 10–35)
Alkaline phosphatase (APISO): 87 U/L (ref 40–115)
BUN / CREAT RATIO: 4 (calc) — AB (ref 6–22)
BUN: 41 mg/dL — ABNORMAL HIGH (ref 7–25)
CO2: 31 mmol/L (ref 20–32)
CREATININE: 9.95 mg/dL — AB (ref 0.70–1.25)
Calcium: 7.8 mg/dL — ABNORMAL LOW (ref 8.6–10.3)
Chloride: 98 mmol/L (ref 98–110)
GLOBULIN: 3.4 g/dL (ref 1.9–3.7)
GLUCOSE: 93 mg/dL (ref 65–99)
POTASSIUM: 4.9 mmol/L (ref 3.5–5.3)
SODIUM: 139 mmol/L (ref 135–146)
TOTAL PROTEIN: 6.9 g/dL (ref 6.1–8.1)
Total Bilirubin: 0.5 mg/dL (ref 0.2–1.2)

## 2017-07-19 LAB — T-HELPER CELL (CD4) - (RCID CLINIC ONLY)
CD4 % Helper T Cell: 28 % — ABNORMAL LOW (ref 33–55)
CD4 T Cell Abs: 590 /uL (ref 400–2700)

## 2017-07-19 LAB — CBC
HEMATOCRIT: 41.7 % (ref 38.5–50.0)
HEMOGLOBIN: 14.6 g/dL (ref 13.2–17.1)
MCH: 32.4 pg (ref 27.0–33.0)
MCHC: 35 g/dL (ref 32.0–36.0)
MCV: 92.7 fL (ref 80.0–100.0)
MPV: 10.5 fL (ref 7.5–12.5)
Platelets: 153 10*3/uL (ref 140–400)
RBC: 4.5 10*6/uL (ref 4.20–5.80)
RDW: 16.7 % — ABNORMAL HIGH (ref 11.0–15.0)
WBC: 11.2 10*3/uL — ABNORMAL HIGH (ref 3.8–10.8)

## 2017-07-19 LAB — RPR: RPR Ser Ql: NONREACTIVE

## 2017-07-19 LAB — LIPID PANEL
Cholesterol: 185 mg/dL (ref <200)
HDL: 37 mg/dL — AB (ref >40)
LDL Cholesterol (Calc): 120 mg/dL (calc) — ABNORMAL HIGH
NON-HDL CHOLESTEROL (CALC): 148 mg/dL — AB (ref <130)
Total CHOL/HDL Ratio: 5 (calc) — ABNORMAL HIGH (ref <5.0)
Triglycerides: 158 mg/dL — ABNORMAL HIGH (ref <150)

## 2017-07-19 LAB — HEPATITIS B SURFACE ANTIBODY,QUALITATIVE: HEP B S AB: NONREACTIVE

## 2017-07-21 LAB — HIV-1 RNA ULTRAQUANT REFLEX TO GENTYP+
HIV 1 RNA Quant: 78 Copies/mL — ABNORMAL HIGH
HIV-1 RNA QUANT, LOG: 1.89 {Log_copies}/mL — AB

## 2017-07-23 ENCOUNTER — Telehealth: Payer: Self-pay | Admitting: *Deleted

## 2017-07-23 NOTE — Telephone Encounter (Signed)
LVM for pt to call office back to let him know that we can definitely help get his vaccines started.  He had a Hep B series back in 2008 I would advise him to check with his pre-transplalnt coordinator to see if this needs to be repeated.  Once he gets that information he can call and schedule a nurse visit to start the process. Katharina Caper, [name], [place]

## 2017-07-24 NOTE — Telephone Encounter (Signed)
Informed pt of below and he is going to check about the Hep B series and then I told him we could definitely get his immunizations started and once he confirms with them he can call and schedule a nurse visit to take care of these. Katharina Caper, [name], [place]

## 2017-07-24 NOTE — Telephone Encounter (Signed)
Patient returning call to April. Call back is [phone].   Danley Danker, RN [facility] RN)

## 2017-07-25 ENCOUNTER — Telehealth: Payer: Self-pay

## 2017-07-25 NOTE — Telephone Encounter (Signed)
Per [name] called pt to schedule a lab appt with lab for a couple of labs [name] would like to do. Was unable to reach the pt, but left a vm for the pt to call back to schedule this appt. [place]

## 2017-07-27 ENCOUNTER — Telehealth: Payer: Self-pay | Admitting: *Deleted

## 2017-07-27 NOTE — Telephone Encounter (Signed)
Called phone number patient provided, as I was unable to find phone number for his [name] provider.  Transferred from scheduling to ID Transplant, left message asking them to provide fax number for [place] to send results if they could not see the results in EPIC. Landis Gandy, RN

## 2017-07-27 NOTE — Telephone Encounter (Signed)
Thanks They will call me.

## 2017-08-14 ENCOUNTER — Other Ambulatory Visit: Payer: BC Managed Care – PPO

## 2017-08-23 ENCOUNTER — Ambulatory Visit (INDEPENDENT_AMBULATORY_CARE_PROVIDER_SITE_OTHER): Payer: Medicare Other | Admitting: Infectious Diseases

## 2017-08-23 ENCOUNTER — Encounter: Payer: Self-pay | Admitting: Infectious Diseases

## 2017-08-23 VITALS — BP 113/68 | HR 112 | Temp 98.7°F | Ht 73.0 in | Wt 138.0 lb

## 2017-08-23 DIAGNOSIS — N186 End stage renal disease: Secondary | ICD-10-CM

## 2017-08-23 DIAGNOSIS — B20 Human immunodeficiency virus [HIV] disease: Secondary | ICD-10-CM

## 2017-08-23 DIAGNOSIS — Z23 Encounter for immunization: Secondary | ICD-10-CM

## 2017-08-23 NOTE — Assessment & Plan Note (Signed)
Doing well on HD Appreciate Dr Deterding's excellent care G[place] clean

## 2017-08-23 NOTE — Addendum Note (Signed)
Addended by: Pola Corn on: 08/23/2017 03:10 PM   Modules accepted: Orders

## 2017-08-23 NOTE — Assessment & Plan Note (Addendum)
Will recheck his HIV RNA and CD4 (282 HIV RNA, 980 CD4 at 07-15-17 [facility] per pt) Hopefully will get to undetectable and get txp.  Start double dose Hep B series.  Hep A series also.  Will see him back in 6 months.

## 2017-08-23 NOTE — Progress Notes (Signed)
   Subjective:    Patient ID: Kenneth [name], male    DOB: 31-Aug-1955, 62 y.o.   MRN: [id number]  HPI 62 yo M with HIV/AIDS on tivicay/epivir/abacavir. He is also ESRD also hx of SLE. He was seen by [facility] June 2016 and had AVF. 01-23-17 ORIF R tibial plateau fracture. He also fractured his R shoulder (no surgery).   He has had f/u at [facility] for renal txp. Still in process. Needs to be undetectable.  Has been feeling well.  Has been instructed to take ART p HD.   HIV 1 RNA Quant  Date Value  07/18/2017 78 Copies/mL (H)  02/15/2017 <20 NOT DETECTED copies/mL  05/16/2016 <20 NOT DETECTED copies/mL   CD4 T Cell Abs (/uL)  Date Value  07/18/2017 590  02/15/2017 700  05/16/2016 320 (L)    Review of Systems  Constitutional: Negative for appetite change and unexpected weight change.  Respiratory: Negative for cough and shortness of breath.   Cardiovascular: Negative for leg swelling.  Gastrointestinal: Negative for constipation and diarrhea.  no problems with fistula.  Anuric.  Please see HPI. All other systems reviewed and negative.     Objective:   Physical Exam  Constitutional: He is oriented to person, place, and time. He appears well-developed and well-nourished.  HENT:  Mouth/Throat: No oropharyngeal exudate.  Eyes: Pupils are equal, round, and reactive to light. EOM are normal.  Neck: Normal range of motion. Neck supple.  Cardiovascular: Normal rate, regular rhythm and normal heart sounds.  Pulmonary/Chest: Effort normal and breath sounds normal.  Abdominal: Soft. Bowel sounds are normal. There is no tenderness. There is no guarding.  Musculoskeletal: He exhibits no edema.       Arms: Lymphadenopathy:    He has no cervical adenopathy.  Neurological: He is alert and oriented to person, place, and time.      Assessment & Plan:

## 2017-08-25 LAB — HIV-1 RNA QUANT-NO REFLEX-BLD
HIV 1 RNA QUANT: NOT DETECTED {copies}/mL
HIV-1 RNA QUANT, LOG: NOT DETECTED {Log_copies}/mL

## 2017-08-28 ENCOUNTER — Ambulatory Visit: Payer: BC Managed Care – PPO | Admitting: Infectious Diseases

## 2017-10-15 ENCOUNTER — Other Ambulatory Visit: Payer: Self-pay | Admitting: Infectious Diseases

## 2017-10-15 ENCOUNTER — Other Ambulatory Visit: Payer: Self-pay | Admitting: Behavioral Health

## 2017-10-15 DIAGNOSIS — B20 Human immunodeficiency virus [HIV] disease: Secondary | ICD-10-CM

## 2017-10-15 MED ORDER — LAMIVUDINE 10 MG/ML PO SOLN: 25.0000 mg | Freq: Every day | ORAL | 5 refills | Status: DC

## 2017-10-15 MED ORDER — ABACAVIR SULFATE 300 MG PO TABS: 600.0000 mg | ORAL_TABLET | Freq: Every day | ORAL | 5 refills | Status: DC

## 2017-10-18 ENCOUNTER — Other Ambulatory Visit: Payer: Self-pay | Admitting: Infectious Diseases

## 2017-10-18 DIAGNOSIS — B20 Human immunodeficiency virus [HIV] disease: Secondary | ICD-10-CM

## 2017-10-30 ENCOUNTER — Other Ambulatory Visit: Payer: Self-pay | Admitting: Infectious Diseases

## 2017-10-30 DIAGNOSIS — B20 Human immunodeficiency virus [HIV] disease: Secondary | ICD-10-CM

## 2017-11-30 NOTE — Telephone Encounter (Signed)
Reached out to specialty pharmacy. They ran July's claim as 30pills for 30 days instead of 60 pills for 30 days.  They will reverse this claim, send medication overnight today.  RN let patient know, asked him to keep taking 2 tablets daily. Landis Gandy, RN

## 2018-02-27 ENCOUNTER — Telehealth: Payer: Self-pay

## 2018-02-27 NOTE — Telephone Encounter (Signed)
Patient is calling to report he left for vacation and left his HIV medications at home.  He will be away for a week an [place].  He wants to make sure it is ok for him to be with out  Medications.   I checked his labs and he is undetectable.  He was advised if he not able to return home to get his medications he should resume them upon return.    Laverle Patter, RN

## 2018-03-07 ENCOUNTER — Other Ambulatory Visit: Payer: Medicare Other

## 2018-03-07 DIAGNOSIS — B20 Human immunodeficiency virus [HIV] disease: Secondary | ICD-10-CM

## 2018-03-08 LAB — T-HELPER CELL (CD4) - (RCID CLINIC ONLY)
CD4 % Helper T Cell: 27 % — ABNORMAL LOW (ref 33–55)
CD4 T Cell Abs: 260 /uL — ABNORMAL LOW (ref 400–2700)

## 2018-03-11 LAB — COMPREHENSIVE METABOLIC PANEL
AG Ratio: 0.8 (calc) — ABNORMAL LOW (ref 1.0–2.5)
ALT: 15 U/L (ref 9–46)
AST: 26 U/L (ref 10–35)
Albumin: 3.7 g/dL (ref 3.6–5.1)
Alkaline phosphatase (APISO): 68 U/L (ref 40–115)
BILIRUBIN TOTAL: 0.5 mg/dL (ref 0.2–1.2)
BUN/Creatinine Ratio: 2 (calc) — ABNORMAL LOW (ref 6–22)
BUN: 17 mg/dL (ref 7–25)
CALCIUM: 8.9 mg/dL (ref 8.6–10.3)
CO2: 34 mmol/L — ABNORMAL HIGH (ref 20–32)
Chloride: 94 mmol/L — ABNORMAL LOW (ref 98–110)
Creat: 7.19 mg/dL — ABNORMAL HIGH (ref 0.70–1.25)
GLUCOSE: 107 mg/dL — AB (ref 65–99)
Globulin: 4.9 g/dL (calc) — ABNORMAL HIGH (ref 1.9–3.7)
Potassium: 5.4 mmol/L — ABNORMAL HIGH (ref 3.5–5.3)
SODIUM: 137 mmol/L (ref 135–146)
TOTAL PROTEIN: 8.6 g/dL — AB (ref 6.1–8.1)

## 2018-03-11 LAB — CBC
HCT: 41.3 % (ref 38.5–50.0)
HEMOGLOBIN: 14.4 g/dL (ref 13.2–17.1)
MCH: 33 pg (ref 27.0–33.0)
MCHC: 34.9 g/dL (ref 32.0–36.0)
MCV: 94.5 fL (ref 80.0–100.0)
MPV: 10.9 fL (ref 7.5–12.5)
Platelets: 237 10*3/uL (ref 140–400)
RBC: 4.37 10*6/uL (ref 4.20–5.80)
RDW: 12.8 % (ref 11.0–15.0)
WBC: 9.3 10*3/uL (ref 3.8–10.8)

## 2018-03-11 LAB — HIV-1 RNA QUANT-NO REFLEX-BLD
HIV 1 RNA QUANT: NOT DETECTED {copies}/mL
HIV-1 RNA Quant, Log: <1.3 Log copies/mL

## 2018-03-20 NOTE — Progress Notes (Signed)
Name: Kenneth [name]  DOB: Dec 22, 1955 MRN: [id number] PCP: [name], MD    Patient Active Problem List   Diagnosis Date Noted  . Osteoporosis 03/15/2017  . Swelling   . Fracture of right tibial plateau, sequela 01/27/2017  . Laceration of eyebrow and forehead   . Pain   . Closed displaced fracture of lateral end of right clavicle   . Closed fracture of right tibial plateau   . Multiple fracture 01/22/2017  . Fall 01/22/2017  . Health care maintenance 06/30/2016  . Chronic fatigue 03/11/2015  . Exacerbation of systemic lupus ([place]) 02/24/2015  . Dysphagia   . ESRD (end stage renal disease) ([place])   . Anasarca 06/22/2014  . DVT (deep venous thrombosis) ([place]) 06/22/2014  . PVC's (premature ventricular contractions) 03/17/2014  . Diarrhea   . Abdominal pain   . Protein-calorie malnutrition, severe ([place]) 02/11/2014  . Lupus nephritis ([place])   . Arthralgia of multiple sites, bilateral 02/02/2014  . Bilateral low back pain without sciatica 02/01/2014  . EXTERNAL HEMORRHOIDS WITHOUT MENTION COMP 01/20/2009  . HIV disease ([place]) 07/24/2006  . Hyperlipidemia 05/31/2006  . ERECTILE DYSFUNCTION 05/31/2006  . INSOMNIA NOS 05/31/2006     Brief Narrative:  Kenneth [name] is a 62 y.o. male with HIV infection on dialysis. HIV Risk: MSM. History of OIs: esophageal candidiasis 2012  Previous Regimens: . Epivir 25 mg QD + Tivicay 50 mg QD + Ziagen 600 mg QD  Genotypes: . 2010: K103N   Subjective:   CC:  Follow up with HIV care.   HPI:  He feels well today. Currently on kidney transplant list for about a year, ESRD on HD for 3 years now. He has felt very well and no complaints. Was called for an organ about a month ago now but was out of town in [place] and unable to accept organ. Last saw [name] in October (C. Wolfe) and was consolidated to [employer] after there was concern with low level viremia. He appreciates regimen in one pill and finds this much easier. No  concern for side effects or access to medication. He lives with his male partner. Not sexually active presently. No problems with depression currently and sleeping well. Still working full time for [employer]. He is planning to return to [place] again for the holidays.   Review of Systems  Constitutional: Negative for chills, fever, malaise/fatigue and weight loss.  HENT: Negative for sore throat.        No dental problems  Respiratory: Negative for cough and sputum production.   Cardiovascular: Negative for chest pain and leg swelling.  Gastrointestinal: Negative for abdominal pain, diarrhea and vomiting.  Genitourinary: Negative for dysuria and flank pain.  Musculoskeletal: Negative for joint pain, myalgias and neck pain.  Skin: Negative for rash.  Neurological: Negative for dizziness, tingling and headaches.  Psychiatric/Behavioral: Negative for depression and substance abuse. The patient is not nervous/anxious and does not have insomnia.     Past Medical History:  Diagnosis Date  . Achalasia   . Candida infection, esophageal ([place]) 08/2010   a. 2012 - noted on EGD.  Marland Kitchen DVT (deep venous thrombosis) ([place]) ~ 04/2014   ?RLE  . Dysphagia 2008   post heller myotomy/toupee fundoplication.    . ESRD (end stage renal disease) on dialysis [facility])    "MWF: [name]" (01/22/2017)  . Fall 01/22/2017   mechanical fall w/multiple fractures/notes 01/22/2017  . GERD (gastroesophageal reflux disease)    hx (  01/22/2017)  . Hemorrhoids, internal   . History of blood transfusion 03/2014   "low counts"  . History of gout   . History of stomach ulcers   . HIV infection ([place]) dx ~ 2009   a. 02/13/2014 CD4 = 240 - undetectable viral load.  . Hyperlipidemia   . Hypertension    hx (01/22/2017)  . Insomnia   . Lupus nephritis ([place])   . Pancytopenia ([place])   . Premature ventricular contractions   . Renal abscess   . SLE (systemic lupus erythematosus) ([place]) dx'd 2015    Outpatient Medications  Prior to Visit  Medication Sig Dispense Refill  . amitriptyline (ELAVIL) 10 MG tablet Take 1 tablet (10 mg total) by mouth at bedtime. 30 tablet 0  . calcitRIOL (ROCALTROL) 0.25 MCG capsule Take 3 capsules (0.75 mcg total) by mouth every Monday, Wednesday, and Friday with hemodialysis. 30 capsule 0  . calcium acetate (PHOSLO) 667 MG capsule Take 2 capsules (1,334 mg total) by mouth 3 (three) times daily with meals. 180 capsule 0  . Cholecalciferol 1000 units tablet Take 1 tablet (1,000 Units total) by mouth daily. 30 tablet 0  . docusate sodium (COLACE) 100 MG capsule Take 1 capsule (100 mg total) by mouth daily as needed for mild constipation. 10 capsule 0  . HYDROcodone-acetaminophen (NORCO) 7.5-325 MG tablet Take 2 tablets by mouth every 4 (four) hours as needed for severe pain ((score 7 to 10)). 20 tablet 0  . hydroxychloroquine (PLAQUENIL) 200 MG tablet Take 1 tablet (200 mg total) by mouth 2 (two) times daily. 30 tablet 0  . metoCLOPramide (REGLAN) 5 MG tablet Take 1-2 tablets (5-10 mg total) by mouth every 8 (eight) hours as needed for nausea (if ondansetron (ZOFRAN) ineffective.). 30 tablet 0  . Multiple Vitamin (MULTIVITAMIN WITH MINERALS) TABS tablet Take 1 tablet by mouth daily. 30 tablet 0  . multivitamin (RENA-VIT) TABS tablet Take by mouth.    . polyethylene glycol (MIRALAX / GLYCOLAX) packet Take 17 g by mouth 2 (two) times daily. 14 each 0  . predniSONE (DELTASONE) 10 MG tablet Take 1 tablet (10 mg total) by mouth daily. 30 tablet 0  . traZODone (DESYREL) 50 MG tablet Take 0.5-1 tablets (25-50 mg total) at bedtime as needed by mouth for sleep. 30 tablet 3  . abacavir-dolutegravir-lamiVUDine (TRIUMEQ) 220-25-427 MG tablet Take by mouth.    Marland Kitchen abacavir (ZIAGEN) 300 MG tablet Take 2 tablets (600 mg total) by mouth daily. (Patient not taking: Reported on 03/21/2018) 60 tablet 5  . lamiVUDine (EPIVIR) 10 MG/ML solution Take 2.5 mLs (25 mg total) by mouth daily at 6 PM. (Patient not taking:  Reported on 03/21/2018) 240 mL 5  . TIVICAY 50 MG tablet TAKE 1 TABLET (50 MG TOTAL) BY MOUTH DAILY AT 6 PM. (Patient not taking: Reported on 03/21/2018) 30 tablet 5   No facility-administered medications prior to visit.      No Known Allergies  Social History   Tobacco Use  . Smoking status: Never Smoker  . Smokeless tobacco: Never Used  Substance Use Topics  . Alcohol use: No  . Drug use: No   Social History   Substance and Sexual Activity  Sexual Activity Not Currently  . Partners: Male     Objective:   Vitals:   03/21/18 1434  BP: 91/61  Pulse: (!) 111  Temp: 97.9 F (36.6 C)   There is no height or weight on file to calculate BMI.  Physical Exam Nursing note reviewed.  Constitutional:      Appearance: He is well-developed.     Comments: Seated comfortably in chair during visit. Thin. Well appearing today.   HENT:     Mouth/Throat:     Dentition: Normal dentition. No dental abscesses.  Cardiovascular:     Rate and Rhythm: Normal rate and regular rhythm.     Heart sounds: Normal heart sounds.     Comments: LAVF +bruit. Unremarkable Pulmonary:     Effort: Pulmonary effort is normal.     Breath sounds: Normal breath sounds.  Abdominal:     General: There is no distension.     Palpations: Abdomen is soft.     Tenderness: There is no abdominal tenderness.  Lymphadenopathy:     Cervical: No cervical adenopathy.  Skin:    General: Skin is warm and dry.     Findings: No rash.  Neurological:     Mental Status: He is alert and oriented to person, place, and time.  Psychiatric:        Judgment: Judgment normal.     Comments: In good spirits today and engaged in care discussion.      Lab Results Lab Results  Component Value Date   WBC 9.3 03/07/2018   HGB 14.4 03/07/2018   HCT 41.3 03/07/2018   MCV 94.5 03/07/2018   PLT 237 03/07/2018    Lab Results  Component Value Date   CREATININE 7.19 (H) 03/07/2018   BUN 17 03/07/2018   NA 137 03/07/2018     K 5.4 (H) 03/07/2018   CL 94 (L) 03/07/2018   CO2 34 (H) 03/07/2018    Lab Results  Component Value Date   ALT 15 03/07/2018   AST 26 03/07/2018   ALKPHOS 108 01/21/2017   BILITOT 0.5 03/07/2018    Lab Results  Component Value Date   CHOL 185 07/18/2017   HDL 37 (L) 07/18/2017   LDLCALC 120 (H) 07/18/2017   LDLDIRECT 65 07/24/2006   TRIG 158 (H) 07/18/2017   CHOLHDL 5.0 (H) 07/18/2017   HIV 1 RNA Quant  Date Value  03/07/2018 <20 NOT DETECTED copies/mL  08/23/2017 <20 NOT DETECTED copies/mL  07/18/2017 78 Copies/mL (H)   CD4 T Cell Abs (/uL)  Date Value  03/07/2018 260 (L)  07/18/2017 590  02/15/2017 700     Assessment & Plan:   Problem List Items Addressed This Visit      Unprioritized   HIV disease ([place]) - Primary (Chronic)    Undetectable with most recent labs after having some low level viremia. He likes the STR again. Will continue Triumeq once daily for him. Not sexually active, no indication for STI screening; anuric for GC/C. RPR once a year.  I see that he needs monthly VL for transplant - I offered him to have them checked here and we can forward to Dr. Rogers Blocker at [facility]. Has access to dental care and negative depression screen.  He can follow up again with me in 6 months. Follow up with [facility] in January and Q64m       Relevant Medications   abacavir-dolutegravir-lamiVUDine (TRIUMEQ) 600-50-300 MG tablet   Other Relevant Orders   T-helper cell (CD4)- ([facility] clinic only)   CBC   HIV-1 RNA quant-no reflex-bld   Comprehensive metabolic panel   RPR   ESRD (end stage renal disease) ([place])    Active on transplant list at [facility].       Health care maintenance    Second double dose Hep  b today (there has been a 6 month delay from first injection), #3 double dose in 4 weeks. Second Hep A today.  Received flu vaccine this year per his account.        Other Visit Diagnoses    Need for hepatitis B vaccination       Need for hepatitis A immunization           Janene Madeira, MSN, NP-C [facility] Pager: [phone] Office: [phone]  03/21/18  3:25 PM

## 2018-03-21 ENCOUNTER — Ambulatory Visit (INDEPENDENT_AMBULATORY_CARE_PROVIDER_SITE_OTHER): Payer: Medicare Other | Admitting: Infectious Diseases

## 2018-03-21 ENCOUNTER — Encounter: Payer: Self-pay | Admitting: Infectious Diseases

## 2018-03-21 VITALS — BP 91/61 | HR 111 | Temp 97.9°F

## 2018-03-21 DIAGNOSIS — Z79899 Other long term (current) drug therapy: Secondary | ICD-10-CM | POA: Diagnosis not present

## 2018-03-21 DIAGNOSIS — Z23 Encounter for immunization: Secondary | ICD-10-CM

## 2018-03-21 DIAGNOSIS — Z0000 Encounter for general adult medical examination without abnormal findings: Secondary | ICD-10-CM | POA: Diagnosis not present

## 2018-03-21 DIAGNOSIS — B20 Human immunodeficiency virus [HIV] disease: Secondary | ICD-10-CM | POA: Diagnosis present

## 2018-03-21 DIAGNOSIS — N186 End stage renal disease: Secondary | ICD-10-CM

## 2018-03-21 MED ORDER — ABACAVIR-DOLUTEGRAVIR-LAMIVUD 600-50-300 MG PO TABS: 1.0000 | ORAL_TABLET | Freq: Every day | ORAL | 56 refills | Status: DC

## 2018-03-21 NOTE — Addendum Note (Signed)
Addended by: Lenore Cordia on: 03/21/2018 04:51 PM   Modules accepted: Orders

## 2018-03-21 NOTE — Assessment & Plan Note (Addendum)
Second double dose Hep b today (there has been a 6 month delay from first injection), #3 double dose in 4 weeks. Second Hep A today.  Received flu vaccine this year per his account.

## 2018-03-21 NOTE — Assessment & Plan Note (Signed)
Active on transplant list at [facility]

## 2018-03-21 NOTE — Assessment & Plan Note (Addendum)
Undetectable with most recent labs after having some low level viremia. He likes the STR again. Will continue Triumeq once daily for him. Not sexually active, no indication for STI screening; anuric for GC/C. RPR once a year.  I see that he needs monthly VL for transplant - I offered him to have them checked here and we can forward to Dr. Rogers Blocker at [facility]. Has access to dental care and negative depression screen.  He can follow up again with me in 6 months. Follow up with [facility] in January and Q88m

## 2018-03-21 NOTE — Patient Instructions (Signed)
Continue your triumeq once a day as you are. Your last viral load was undetectable.   We can repeat your viral load here again in 1 month if your transplant team needs this done.   We gave you your second double does of hep b today - please return in 1 month for your final shot (can do labs then)  Please return to see Colletta Maryland again in 6 months - Follow up with [facility] as already arranged.   Merry Christmas!

## 2018-04-11 ENCOUNTER — Ambulatory Visit (INDEPENDENT_AMBULATORY_CARE_PROVIDER_SITE_OTHER): Payer: Medicare Other | Admitting: Family Medicine

## 2018-04-11 ENCOUNTER — Encounter: Payer: Self-pay | Admitting: Family Medicine

## 2018-04-11 ENCOUNTER — Other Ambulatory Visit: Payer: Self-pay

## 2018-04-11 VITALS — BP 92/54 | HR 110 | Temp 98.6°F | Wt 132.1 lb

## 2018-04-11 DIAGNOSIS — F5232 Male orgasmic disorder: Secondary | ICD-10-CM | POA: Diagnosis not present

## 2018-04-11 DIAGNOSIS — R102 Pelvic and perineal pain: Secondary | ICD-10-CM

## 2018-04-11 MED ORDER — SILDENAFIL CITRATE 25 MG PO TABS: 25.0000 mg | ORAL_TABLET | Freq: Every day | ORAL | 0 refills | Status: DC | PRN

## 2018-04-11 NOTE — Assessment & Plan Note (Signed)
Patient has had trouble maintaining erections.  He does not having morning erections.  He has never taken medication for this.  He has chronically low blood pressure and I informed patient that viagra can lower blood pressure.  Gave patient a prescription for the lowest dose of viagra and told him to cut pill in half if he would like to try it.  Advised him of symptoms of hypotension.

## 2018-04-11 NOTE — Progress Notes (Signed)
   [facility] Phone: [phone]   cc: suprapubic pain, erectile dysfunction  Subjective:   Patient has pain below the navel.  It hurts when he bends over or stands up straight.  This has been going on about a year.  He Notices it about once every week.  Doesn't prevent him from doing anything.  Pain goes away after a minute   Erectile dysfunction  - he has never used viagra before.  He has trouble maintaining an erection but not getting an erection.  Doesn't have morning erections.  Has been in  same monogomous relationship with partner for five years.    Weight loss Lost about five lbs.  No changes in appetite. No diarrhea or weight loss. No changes in diet.  Doesn't take a nutritional supplement.       HIV - sees infectious disease specialist for this. He is well controlled on triumeq.  partner has HIV as well.    ESRD - goes MWF to dialysis. He is on the transplant list.      Lupus - has been diagnosed for about three years.  Sees a specialist for this and takes plaquenil and prednisone.   ROS: See HPI for pertinent positives and negatives  Family history reviewed for today's visit. No changes.  Social history- patient is a non  Smoker.  Non drinker.  No drugs.    He has a job as a IT consultant at [employer]     Objective: BP (!) 92/54   Pulse (!) 110   Temp 98.6 F (37 C) (Oral)   Wt 132 lb 2 oz (59.9 kg)   SpO2 98%   BMI 17.43 kg/m  Gen: NAD, alert and oriented, cooperative with exam HEENT: NCAT, EOMI, MMM Neck: FROM, supple, no masses. No lymphadenopathy CV: normal rate, regular rhythm. No murmurs, no rubs.  Resp: LCTAB, no wheezes, crackles. normal work of breathing GI: nontender to palpation, BS present, no guarding or organomegaly Msk: No edema, warm, normal tone, moves UE/LE spontaneously Neuro: CN II-XII grossly intact. no gross deficits Skin: No rashes, no lesions Psych: Appropriate behavior  Assessment/Plan: Suprapubic  pain Patient has had long standing intermittent pain occurring midline between his navel and pubic bone.  This pain occurs when standing up or bending over and happens maybe once every one or two weeks.  The pain lasts less than a minute and is mild to moderate.  Unsure of the exact source of his pain but likely musculoskeletal.    ERECTILE DYSFUNCTION Patient has had trouble maintaining erections.  He does not having morning erections.  He has never taken medication for this.  He has chronically low blood pressure and I informed patient that viagra can lower blood pressure.  Gave patient a prescription for the lowest dose of viagra and told him to cut pill in half if he would like to try it.  Advised him of symptoms of hypotension.     Clemetine Marker, MD PGY-1

## 2018-04-11 NOTE — Patient Instructions (Signed)
It was nice to meet you today Mr Kenneth [name],   I did not get any new labs for you today.  If you would like to start taking viagra, please be aware that it can lower blood pressure and your blood pressure is already pretty low.  You would have to weigh the benefits and risks of this when you decide whether to take this medication.    As for your belly pain, it is likely a benign problem that is just something you will have to live with.  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  It is nothing to be concerned about.    I don't need to follow up with you for anything in particular as most of your issues are being managed by specialists that you follow with.  I'd like to see you again in six months.    Have a great day,   Clemetine Marker, MD

## 2018-04-11 NOTE — Assessment & Plan Note (Signed)
Patient has had long standing intermittent pain occurring midline between his navel and pubic bone.  This pain occurs when standing up or bending over and happens maybe once every one or two weeks.  The pain lasts less than a minute and is mild to moderate.  Unsure of the exact source of his pain but likely musculoskeletal.

## 2018-04-23 ENCOUNTER — Ambulatory Visit (INDEPENDENT_AMBULATORY_CARE_PROVIDER_SITE_OTHER): Payer: Medicare Other | Admitting: *Deleted

## 2018-04-23 DIAGNOSIS — Z23 Encounter for immunization: Secondary | ICD-10-CM

## 2018-04-23 DIAGNOSIS — B20 Human immunodeficiency virus [HIV] disease: Secondary | ICD-10-CM

## 2018-04-23 NOTE — Progress Notes (Signed)
Patient here for #3/3 Hepatitis B double dose vaccination. He states he received a kidney transplant at [facility] on 1/12, is no longer on dialysis. RN asked for clarification with Dr Tommy Medal if vaccination was still appropriate, received verbal order to continue as scheduled.  Patient has a fistula in his left arm, 2 doses administered in right deltoid. After injection, RN stepped out to speak with [facility] case manager about connecting patient for TAP needs.  Upon return to the room, the patient notified this nurse that he just completed a call to [facility] where they told him he should not have injections/immunization for 6 months after transplant, that he would likely not have the desired immune response to the injection. Landis Gandy, RN

## 2018-04-23 NOTE — Progress Notes (Signed)
So happy to hear about his transplant! Will check a surface antibody later on in July to see if by some small chance he took to immunity following double dose. He was already significantly delayed between dose 1 and 2 by the time I saw him I am not terribly confident at baseline.   Thank you for the update.

## 2018-08-17 ENCOUNTER — Other Ambulatory Visit: Payer: Self-pay

## 2018-08-17 ENCOUNTER — Encounter (HOSPITAL_COMMUNITY): Payer: Self-pay | Admitting: Emergency Medicine

## 2018-08-17 ENCOUNTER — Emergency Department (HOSPITAL_COMMUNITY): Payer: Medicare Other

## 2018-08-17 ENCOUNTER — Emergency Department (HOSPITAL_COMMUNITY)
Admission: EM | Admit: 2018-08-17 | Discharge: 2018-08-17 | Disposition: A | Discharge status: Home / Self Care | Payer: Medicare Other | Attending: Emergency Medicine | Admitting: Emergency Medicine

## 2018-08-17 DIAGNOSIS — Z79899 Other long term (current) drug therapy: Secondary | ICD-10-CM | POA: Diagnosis not present

## 2018-08-17 DIAGNOSIS — Y93K1 Activity, walking an animal: Secondary | ICD-10-CM | POA: Insufficient documentation

## 2018-08-17 DIAGNOSIS — M25552 Pain in left hip: Secondary | ICD-10-CM | POA: Diagnosis not present

## 2018-08-17 DIAGNOSIS — Y999 Unspecified external cause status: Secondary | ICD-10-CM | POA: Diagnosis not present

## 2018-08-17 DIAGNOSIS — W1830XA Fall on same level, unspecified, initial encounter: Secondary | ICD-10-CM | POA: Insufficient documentation

## 2018-08-17 DIAGNOSIS — S79912A Unspecified injury of left hip, initial encounter: Secondary | ICD-10-CM | POA: Diagnosis present

## 2018-08-17 DIAGNOSIS — B20 Human immunodeficiency virus [HIV] disease: Secondary | ICD-10-CM | POA: Diagnosis not present

## 2018-08-17 DIAGNOSIS — M3210 Systemic lupus erythematosus, organ or system involvement unspecified: Secondary | ICD-10-CM | POA: Diagnosis not present

## 2018-08-17 DIAGNOSIS — I10 Essential (primary) hypertension: Secondary | ICD-10-CM | POA: Insufficient documentation

## 2018-08-17 DIAGNOSIS — W19XXXA Unspecified fall, initial encounter: Secondary | ICD-10-CM

## 2018-08-17 DIAGNOSIS — Y929 Unspecified place or not applicable: Secondary | ICD-10-CM | POA: Insufficient documentation

## 2018-08-17 DIAGNOSIS — Z940 Kidney transplant status: Secondary | ICD-10-CM | POA: Diagnosis not present

## 2018-08-17 MED ORDER — HYDROCODONE-ACETAMINOPHEN 5-325 MG PO TABS
1.0000 | ORAL_TABLET | Freq: Once | ORAL | Status: AC
  Administered 2018-08-17: 15:00:00 1 via ORAL
  Filled 2018-08-17: qty 1

## 2018-08-17 MED ORDER — ACETAMINOPHEN 500 MG PO TABS: 500.0000 mg | ORAL_TABLET | Freq: Four times a day (QID) | ORAL | 0 refills | Status: AC | PRN

## 2018-08-17 NOTE — ED Provider Notes (Signed)
[facility] EMERGENCY DEPARTMENT Provider Note   CSN: [id number] Arrival date & time: 08/17/18  1207    History   Chief Complaint Chief Complaint  Patient presents with   Fall   Hip Pain    left     HPI Kenneth [name] is a 63 y.o. male.     63 y.o male with a PMH of ESRD (renal transplant in February) followed by [facility] presents to the ED s/p fall while walking his dog.  Patient reports he was walking his dog when he got caught on the leash which surrounded his left leg, he then fell did not strike his head but landed on the left side of her leg.  Reports he has not been able to weight-bear since the incident.  Patient reports pain along the pelvis, hip joint.  Has not taken any medication for relieving symptoms.  Patient does currently take a daily aspirin.  Denies any headache, loss of consciousness, other injury.     Past Medical History:  Diagnosis Date   Achalasia    Candida infection, esophageal ([place]) 08/2010   a. 2012 - noted on EGD.   DVT (deep venous thrombosis) ([place]) ~ 04/2014   ?RLE   Dysphagia 2008   post heller myotomy/toupee fundoplication.     ESRD (end stage renal disease) on dialysis [facility])    "MWF: [name]" (01/22/2017)   Fall 01/22/2017   mechanical fall w/multiple fractures/notes 01/22/2017   GERD (gastroesophageal reflux disease)    hx (01/22/2017)   Hemorrhoids, internal    History of blood transfusion 03/2014   "low counts"   History of gout    History of stomach ulcers    HIV infection ([place]) dx ~ 2009   a. 02/13/2014 CD4 = 240 - undetectable viral load.   Hyperlipidemia    Hypertension    hx (01/22/2017)   Insomnia    Lupus nephritis (HCC)    Pancytopenia (HCC)    Premature ventricular contractions    Renal abscess    SLE (systemic lupus erythematosus) ([place]) dx'd 2015    Patient Active Problem List   Diagnosis Date Noted   Suprapubic pain 04/11/2018   Osteoporosis 03/15/2017    Health care maintenance 06/30/2016   Chronic fatigue 03/11/2015   Exacerbation of systemic lupus ([place]) 02/24/2015   Dysphagia    ESRD (end stage renal disease) ([place])    DVT (deep venous thrombosis) ([place]) 06/22/2014   PVC's (premature ventricular contractions) 03/17/2014   Abdominal pain    Protein-calorie malnutrition, severe ([place]) 02/11/2014   Lupus nephritis (HCC)    Arthralgia of multiple sites, bilateral 02/02/2014   Bilateral low back pain without sciatica 02/01/2014   EXTERNAL HEMORRHOIDS WITHOUT MENTION COMP 01/20/2009   HIV disease ([place]) 07/24/2006   Hyperlipidemia 05/31/2006   ERECTILE DYSFUNCTION 05/31/2006   INSOMNIA NOS 05/31/2006    Past Surgical History:  Procedure Laterality Date   BASCILIC VEIN TRANSPOSITION Left 06/29/2014   Procedure: FIRST STAGE  [place];  Surgeon: Rosetta Posner, MD;  Location: [place];  Service: Vascular;  Laterality: Left;   [name] Left 09/11/2014   Procedure: SECOND STAGE BASILIC VEIN TRANSPOSITION LEFT UPPER ARM;  Surgeon: Rosetta Posner, MD;  Location: [place];  Service: Vascular;  Laterality: Left;   COLONOSCOPY  2008   .  tortuous colon, internal hemorrhoids.  no polyps or diverticulosis.    ESOPHAGOGASTRODUODENOSCOPY  08/2010   Dr [name].  08/2010 candida esophagitis, no obvious  esophageal stricture. 02/2006 and 05/2006 dilated esophagus, narrowed distal esophagus but no stricture, was empirically [place] dilated   ESOPHAGOGASTRODUODENOSCOPY N/A 06/27/2014   Procedure: ESOPHAGOGASTRODUODENOSCOPY (EGD);  Surgeon: [name], MD;  Location: [facility] ENDOSCOPY;  Service: Endoscopy;  Laterality: N/A;   ESOPHAGOGASTRODUODENOSCOPY N/A 07/02/2014   Procedure: ESOPHAGOGASTRODUODENOSCOPY (EGD);  Surgeon: [name], MD;  Location: [facility] ENDOSCOPY;  Service: Endoscopy;  Laterality: N/A;  with botox   [place]   with toupee fundoplication of HH.    INSERTION OF DIALYSIS CATHETER Right 06/29/2014     Procedure: INSERTION OF DIALYSIS CATHETER RIGHT IJ;  Surgeon: Rosetta Posner, MD;  Location: [place];  Service: Vascular;  Laterality: Right;   NEPHRECTOMY Right ~ 2011   ORIF TIBIA PLATEAU Right 01/23/2017   Procedure: OPEN REDUCTION INTERNAL FIXATION (ORIF) TIBIAL PLATEAU; REPAIR OF LATERAL TIBIAL PLATEAU; ARTHROTOMY WITH MENISCUS REPAIR; ANTERIOR COMPARTMENT FASCIOTOMY;  Surgeon: Altamese [place], MD;  Location: [place];  Service: Orthopedics;  Laterality: Right;        Home Medications    Prior to Admission medications   Medication Sig Start Date End Date Taking? Authorizing Provider  abacavir-dolutegravir-lamiVUDine (TRIUMEQ) 600-50-300 MG tablet Take 1 tablet by mouth daily. 03/21/18   [place] Callas, NP  acetaminophen (TYLENOL) 500 MG tablet Take 1 tablet (500 mg total) by mouth every 6 (six) hours as needed for up to 7 days. 08/17/18 08/24/18  Janeece Fitting, PA-C  amitriptyline (ELAVIL) 10 MG tablet Take 1 tablet (10 mg total) by mouth at bedtime. 02/02/17   Angiulli, Lavon Paganini, PA-C  calcitRIOL (ROCALTROL) 0.25 MCG capsule Take 3 capsules (0.75 mcg total) by mouth every Monday, Wednesday, and Friday with hemodialysis. 02/02/17   Angiulli, Lavon Paganini, PA-C  calcium acetate (PHOSLO) 667 MG capsule Take 2 capsules (1,334 mg total) by mouth 3 (three) times daily with meals. 02/02/17   Angiulli, Lavon Paganini, PA-C  docusate sodium (COLACE) 100 MG capsule Take 1 capsule (100 mg total) by mouth daily as needed for mild constipation. 01/27/17   Eloise Levels, MD  HYDROcodone-acetaminophen ([place]) 7.5-325 MG tablet Take 2 tablets by mouth every 4 (four) hours as needed for severe pain ((score 7 to 10)). 02/02/17   Angiulli, Lavon Paganini, PA-C  hydroxychloroquine (PLAQUENIL) 200 MG tablet Take 1 tablet (200 mg total) by mouth 2 (two) times daily. 02/02/17   Angiulli, Lavon Paganini, PA-C  Multiple Vitamin (MULTIVITAMIN WITH MINERALS) TABS tablet Take 1 tablet by mouth daily. 01/28/17   Eloise Levels, MD   multivitamin (RENA-VIT) TABS tablet Take by mouth. 11/06/16   [provider]  polyethylene glycol (MIRALAX / GLYCOLAX) packet Take 17 g by mouth 2 (two) times daily. 01/27/17   Eloise Levels, MD  predniSONE (DELTASONE) 10 MG tablet Take 1 tablet (10 mg total) by mouth daily. 02/02/17   Angiulli, Lavon Paganini, PA-C  sildenafil (VIAGRA) 25 MG tablet Take 1 tablet (25 mg total) by mouth daily as needed for erectile dysfunction. 04/11/18 05/11/18  [name], MD    Family History Family History  Problem Relation Age of Onset   Colon cancer Father        deceased   Other Mother        s/p pacemaker - alive and well.   Hypertension Mother    Other Other        5 brothers, 3 sisters - alive and well.    Social History Social History   Tobacco Use   Smoking status: Never  Smoker   Smokeless tobacco: Never Used  Substance Use Topics   Alcohol use: No   Drug use: No     Allergies   Patient has no known allergies.   Review of Systems Review of Systems  Constitutional: Negative for chills and fever.  HENT: Negative for ear pain and sore throat.   Eyes: Negative for pain and visual disturbance.  Respiratory: Negative for cough and shortness of breath.   Cardiovascular: Negative for chest pain and palpitations.  Gastrointestinal: Negative for abdominal pain and vomiting.  Genitourinary: Negative for dysuria and hematuria.  Musculoskeletal: Positive for arthralgias and myalgias. Negative for back pain.  Skin: Negative for color change and rash.  Neurological: Negative for seizures and syncope.  All other systems reviewed and are negative.    Physical Exam Updated Vital Signs BP (!) 146/84    Pulse 90    Temp 98.6 F (37 C) (Oral)    Resp (!) 21    Ht 5\' 11"  (1.803 m)    Wt 54 kg    SpO2 100%    BMI 16.60 kg/m   Physical Exam Vitals signs and nursing note reviewed.  Constitutional:      Appearance: He is well-developed.  HENT:     Head: Normocephalic and  atraumatic.  Eyes:     General: No scleral icterus.    Pupils: Pupils are equal, round, and reactive to light.  Neck:     Musculoskeletal: Normal range of motion.  Cardiovascular:     Heart sounds: Normal heart sounds.  Pulmonary:     Effort: Pulmonary effort is normal.     Breath sounds: Normal breath sounds. No wheezing.  Chest:     Chest wall: No tenderness.  Abdominal:     General: Bowel sounds are normal. There is no distension.     Palpations: Abdomen is soft.     Tenderness: There is no abdominal tenderness.  Musculoskeletal:        General: No tenderness or deformity.     Left hip: He exhibits decreased range of motion and decreased strength. He exhibits no tenderness, no bony tenderness, no crepitus, no deformity and no laceration.     Left knee: Normal.     Left ankle: Normal.     Comments: Pulses present, able to leg raise but limited to 30 degrees.  Decreased strength due to pain.  Skin:    General: Skin is warm and dry.  Neurological:     Mental Status: He is alert and oriented to person, place, and time.      ED Treatments / Results  Labs (all labs ordered are listed, but only abnormal results are displayed) Labs Reviewed - No data to display  EKG None  Radiology Ct Hip Left Wo Contrast  Result Date: 08/17/2018 CLINICAL DATA:  Left hip pain after fall. EXAM: CT OF THE LEFT HIP WITHOUT CONTRAST TECHNIQUE: Multidetector CT imaging of the left hip was performed according to the standard protocol. Multiplanar CT image reconstructions were also generated. COMPARISON:  Left hip x-rays from same day. CT abdomen pelvis dated June 22, 2014. FINDINGS: Bones/Joint/Cartilage There is a tiny nondisplaced fracture of the anterior left sacral ala (series 3, image 19). No additional fracture. No dislocation. The left hip joint space is preserved. No joint effusion. Ligaments Suboptimally assessed by CT. Muscles and Tendons Grossly intact. Soft tissues No soft tissue mass or  fluid collection. IMPRESSION: 1. Tiny nondisplaced fracture of the anterior left sacral ala. 2. No  femur fracture. Electronically Signed   By: Titus Dubin M.D.   On: 08/17/2018 17:07   Dg Hip Unilat With Pelvis 2-3 Views Left  Result Date: 08/17/2018 CLINICAL DATA:  Fall and posterior left hip pain. EXAM: DG HIP (WITH OR WITHOUT PELVIS) 2-3V LEFT COMPARISON:  CT of the pelvis 06/22/2014 FINDINGS: Pelvic bony structures are intact. Surgical clips in the right hemipelvic region. No gross abnormality to the right hip joint. Left hip is located without a fracture. IMPRESSION: No acute bone abnormality to the pelvis or left hip. Electronically Signed   By: Markus Daft M.D.   On: 08/17/2018 13:56    Procedures Procedures (including critical care time)  Medications Ordered in ED Medications  HYDROcodone-acetaminophen (NORCO/VICODIN) 5-325 MG per tablet 1 tablet (1 tablet Oral Given 08/17/18 1452)     Initial Impression / Assessment and Plan / ED Course  I have reviewed the triage vital signs and the nursing notes.  Pertinent labs & imaging results that were available during my care of the patient were reviewed by me and considered in my medical decision making (see chart for details).    Patient with a PMH of kidney transplant in presents to the ED status post fall while ambulating with his dog, reports the leash got caught around his left leg and he fell on that side, reports he has not been ambulatory since.  During evaluation patient reports pain with lifting of his left leg, neurovascularly intact.  Will obtain x-ray to further evaluate. X-ray of his left hip showed: No acute bone abnormality to the pelvis or left hip.  Patient has been informed of results, he is currently on medication as he is a recent transplant patient, shared decision making conversation with patient about pain medication, does report taking some Tylenol without relieving symptoms. I have discussed with patient the risk  and benefits of prescribing him pain medication at this time, patient reports he will like some higher pain medication along with Tylenol to help with symptoms control at home.  I will prescribe Percocet along with Tylenol for patient to take at home.  He is to follow-up with PCP as needed.  Patient is currently on multiple medications for his kidney transplant, risks and benefits discussed with taking medication prior to prescribing.  Patient understands and agrees with management.   3:25 PM Patient was given norco for pain, was ambulated by nursing staff reports he cannot weightbear on his left leg, will obtain CT Hip for further evaluation.   CT of the left hip was ordered to further evaluate as patient cannot put weight on the left extremity.  CT pelvis showed: 1. Tiny nondisplaced fracture of the anterior left sacral ala.  2. No femur fracture.     Patient was giving Norco while in the ED, reports improvement in symptoms but his pain.  Patient has been updated of his results, this likely not to be surgical, patient does report he can ambulate with crutches which he has at home, we will not be providing him with a new set of crutches today.  Patient will follow up with Dr. Ninfa Linden orthopedist for further management of his fracture.  Patient otherwise with stable vital signs, stable for discharge at this time.  Portions of this note were generated with [employer]. Dictation errors may occur despite best attempts at proofreading.   Final Clinical Impressions(s) / ED Diagnoses   Final diagnoses:  Fall, initial encounter  Left hip pain    ED  Discharge Orders         Ordered    CT HIP LEFT WO CONTRAST  Status:  Canceled     08/17/18 1538    acetaminophen (TYLENOL) 500 MG tablet  Every 6 hours PRN     08/17/18 1738           Janeece Fitting, PA-C 08/17/18 1742    Dorie Rank, MD 08/18/18 [phone]

## 2018-08-17 NOTE — ED Notes (Signed)
Pt has pain only with movemenrt

## 2018-08-17 NOTE — ED Triage Notes (Signed)
Pt. Stated, I was outside and I got tangled with dog leash, and fell on hip left.

## 2018-08-17 NOTE — Discharge Instructions (Addendum)
Your xray today was negative. I have prescribed medication to help with your pain, please take this as scheduled. If you experience any worsening symptoms please return to the Emergency Department.

## 2018-08-17 NOTE — ED Notes (Signed)
Pt has cell phone and will communicate with wife.

## 2018-08-17 NOTE — ED Notes (Signed)
Kenneth [phone] (Ride home: Needs to be updated)

## 2018-08-17 NOTE — ED Notes (Signed)
Patient is on the phone with family to give update

## 2018-08-18 ENCOUNTER — Encounter: Payer: Self-pay | Admitting: Family Medicine

## 2018-08-19 ENCOUNTER — Encounter: Payer: Self-pay | Admitting: Family Medicine

## 2018-08-22 ENCOUNTER — Encounter: Payer: Self-pay | Admitting: Family Medicine

## 2018-08-22 ENCOUNTER — Other Ambulatory Visit: Payer: Self-pay

## 2018-08-22 ENCOUNTER — Ambulatory Visit (INDEPENDENT_AMBULATORY_CARE_PROVIDER_SITE_OTHER): Payer: Medicare Other | Admitting: Family Medicine

## 2018-08-22 VITALS — BP 128/60 | HR 111 | Ht 71.0 in | Wt 122.5 lb

## 2018-08-22 DIAGNOSIS — R000 Tachycardia, unspecified: Secondary | ICD-10-CM

## 2018-08-22 DIAGNOSIS — S32110A Nondisplaced Zone I fracture of sacrum, initial encounter for closed fracture: Secondary | ICD-10-CM | POA: Insufficient documentation

## 2018-08-22 DIAGNOSIS — S32110D Nondisplaced Zone I fracture of sacrum, subsequent encounter for fracture with routine healing: Secondary | ICD-10-CM

## 2018-08-22 MED ORDER — OXYCODONE HCL 5 MG PO TABS: 5.0000 mg | ORAL_TABLET | ORAL | 0 refills | Status: DC | PRN

## 2018-08-22 MED ORDER — POLYETHYLENE GLYCOL 3350 17 GM/SCOOP PO POWD: 17.0000 g | Freq: Two times a day (BID) | ORAL | 1 refills | Status: DC | PRN

## 2018-08-22 NOTE — Progress Notes (Signed)
    Subjective:  Kenneth [name] is a 63 y.o. male who presents to the [facility] today with a chief complaint of hip pain.   HPI: Hip pain Kenneth [name] notes that he has had significant pain of his left buttock/hip since his fall on 5/16.  Shortly after his fall, he was seen in the emergency department and was diagnosed with a sacral ala fracture by CT scanning.  He was discharged from the emergency department and told to take Tylenol for his discomfort.  It is now been about 5 days that he has remained in significant discomfort.  He reports that he has a 10/10 shooting pain from his left buttock to his left hip.  He notices this pain most when turning in bed or making an effort to walk.  Is generally not hurt to palpate.   They are not aware that the emergency room physician recommended to see an orthopedist.  Tachycardia Initial heart rate was 111.  Repeat heart rate 114.  He reports that he does continue to have significant pain in his left hip.  He denies fevers, chest pain, palpitations, shortness of breath, pain with urination, nausea, vomiting.  Chief Complaint noted Review of Symptoms - see HPI PMH -HIV, recent kidney transplant   Objective:  Physical Exam: BP 128/60   Pulse (!) 111   Ht 5\' 11"  (1.803 m)   Wt 122 lb 8 oz (55.6 kg)   SpO2 99%   BMI 17.09 kg/m    Gen: Cachectic 63 year old man appearing older than stated age sitting uncomfortably in the exam chair.  He would occasionally adjust his position in twinge with pain. MSK: No tenderness to palpation of the left hip, SI joint, gluteal area.  He was able to ambulate with mild discomfort while using 2 crutches. Respiratory: Breathing comfortably on room air  No results found for this or any previous visit (from the past 72 hour(s)).   Assessment/Plan:  Closed nondisplaced zone I fracture of sacrum (HCC) -Continue Tylenol -Oxycodone 5 mg every 4 hours as needed (20 pills given) database checked, no recent opioid use.  -Daily MiraLAX -Ambulatory referral to physical therapy -Ambulatory referral to orthopedics  Tachycardia Likely secondary to pain.  No other signs or symptoms that might indicate he has an infection is currently experiencing SVT.  Given his poorly controlled HIV and recent kidney transplant, he is immunocompromised and vulnerable to infection.  Strict return precautions were provided if he did begin to experience infectious symptoms.

## 2018-08-22 NOTE — Patient Instructions (Signed)
I am sorry you are having so much pain from this sacrum fracture.  I prescribed medication for pain control which will be first sufficient for the next 4 to 5 days.  This pain medication can make you constipated so please also start taking MiraLAX.  The MiraLAX can be bought over-the-counter I only included it in your prescribed medication so the name would be on the paper.  I have also placed a referral for you to follow-up with orthopedics.

## 2018-08-22 NOTE — Assessment & Plan Note (Signed)
Likely secondary to pain.  No other signs or symptoms that might indicate he has an infection is currently experiencing SVT.  Given his poorly controlled HIV and recent kidney transplant, he is immunocompromised and vulnerable to infection.  Strict return precautions were provided if he did begin to experience infectious symptoms.

## 2018-08-22 NOTE — Assessment & Plan Note (Addendum)
-  Continue Tylenol -Oxycodone 5 mg every 4 hours as needed (20 pills given) database checked, no recent opioid use. -Daily MiraLAX -Ambulatory referral to physical therapy -Ambulatory referral to orthopedics

## 2018-08-24 ENCOUNTER — Emergency Department (HOSPITAL_COMMUNITY): Payer: Medicare Other

## 2018-08-24 ENCOUNTER — Other Ambulatory Visit: Payer: Self-pay

## 2018-08-24 ENCOUNTER — Inpatient Hospital Stay (HOSPITAL_COMMUNITY)
Admission: EM | Admit: 2018-08-24 | Discharge: 2018-08-27 | DRG: 551 | Disposition: A | Discharge status: Skilled Nursing Facility | Payer: Medicare Other | Attending: Internal Medicine | Admitting: Internal Medicine

## 2018-08-24 ENCOUNTER — Encounter (HOSPITAL_COMMUNITY): Payer: Self-pay | Admitting: Emergency Medicine

## 2018-08-24 DIAGNOSIS — R5382 Chronic fatigue, unspecified: Secondary | ICD-10-CM | POA: Diagnosis present

## 2018-08-24 DIAGNOSIS — R296 Repeated falls: Secondary | ICD-10-CM

## 2018-08-24 DIAGNOSIS — S0181XA Laceration without foreign body of other part of head, initial encounter: Secondary | ICD-10-CM | POA: Diagnosis present

## 2018-08-24 DIAGNOSIS — S32119A Unspecified Zone I fracture of sacrum, initial encounter for closed fracture: Secondary | ICD-10-CM | POA: Diagnosis not present

## 2018-08-24 DIAGNOSIS — M3214 Glomerular disease in systemic lupus erythematosus: Secondary | ICD-10-CM | POA: Diagnosis present

## 2018-08-24 DIAGNOSIS — Z7952 Long term (current) use of systemic steroids: Secondary | ICD-10-CM

## 2018-08-24 DIAGNOSIS — N186 End stage renal disease: Secondary | ICD-10-CM | POA: Diagnosis present

## 2018-08-24 DIAGNOSIS — M8580 Other specified disorders of bone density and structure, unspecified site: Secondary | ICD-10-CM | POA: Diagnosis present

## 2018-08-24 DIAGNOSIS — S3210XA Unspecified fracture of sacrum, initial encounter for closed fracture: Secondary | ICD-10-CM

## 2018-08-24 DIAGNOSIS — E43 Unspecified severe protein-calorie malnutrition: Secondary | ICD-10-CM | POA: Diagnosis present

## 2018-08-24 DIAGNOSIS — S63115A Dislocation of metacarpophalangeal joint of left thumb, initial encounter: Secondary | ICD-10-CM | POA: Diagnosis present

## 2018-08-24 DIAGNOSIS — B20 Human immunodeficiency virus [HIV] disease: Secondary | ICD-10-CM | POA: Diagnosis present

## 2018-08-24 DIAGNOSIS — Z86718 Personal history of other venous thrombosis and embolism: Secondary | ICD-10-CM

## 2018-08-24 DIAGNOSIS — Z21 Asymptomatic human immunodeficiency virus [HIV] infection status: Secondary | ICD-10-CM | POA: Diagnosis present

## 2018-08-24 DIAGNOSIS — I10 Essential (primary) hypertension: Secondary | ICD-10-CM | POA: Diagnosis present

## 2018-08-24 DIAGNOSIS — S62509A Fracture of unspecified phalanx of unspecified thumb, initial encounter for closed fracture: Secondary | ICD-10-CM | POA: Diagnosis present

## 2018-08-24 DIAGNOSIS — Y92009 Unspecified place in unspecified non-institutional (private) residence as the place of occurrence of the external cause: Secondary | ICD-10-CM

## 2018-08-24 DIAGNOSIS — W010XXA Fall on same level from slipping, tripping and stumbling without subsequent striking against object, initial encounter: Secondary | ICD-10-CM | POA: Diagnosis present

## 2018-08-24 DIAGNOSIS — Z1159 Encounter for screening for other viral diseases: Secondary | ICD-10-CM

## 2018-08-24 DIAGNOSIS — Z905 Acquired absence of kidney: Secondary | ICD-10-CM

## 2018-08-24 DIAGNOSIS — W19XXXA Unspecified fall, initial encounter: Secondary | ICD-10-CM

## 2018-08-24 DIAGNOSIS — Z940 Kidney transplant status: Secondary | ICD-10-CM

## 2018-08-24 DIAGNOSIS — Z8249 Family history of ischemic heart disease and other diseases of the circulatory system: Secondary | ICD-10-CM

## 2018-08-24 DIAGNOSIS — S329XXA Fracture of unspecified parts of lumbosacral spine and pelvis, initial encounter for closed fracture: Secondary | ICD-10-CM | POA: Diagnosis present

## 2018-08-24 MED ORDER — LIDOCAINE-EPINEPHRINE (PF) 2 %-1:200000 IJ SOLN
10.0000 mL | Freq: Once | INTRAMUSCULAR | Status: AC
  Administered 2018-08-25: 10 mL via INTRADERMAL
  Filled 2018-08-24: qty 10

## 2018-08-24 MED ORDER — FENTANYL CITRATE (PF) 100 MCG/2ML IJ SOLN
100.0000 ug | Freq: Once | INTRAMUSCULAR | Status: AC
  Administered 2018-08-24: 100 ug via INTRAVENOUS
  Filled 2018-08-24: qty 2

## 2018-08-24 MED ORDER — LIDOCAINE-EPINEPHRINE (PF) 2 %-1:200000 IJ SOLN
10.0000 mL | Freq: Once | INTRAMUSCULAR | Status: AC
  Administered 2018-08-24: 10 mL via INTRADERMAL
  Filled 2018-08-24: qty 10

## 2018-08-24 MED ORDER — BACITRACIN ZINC 500 UNIT/GM EX OINT
TOPICAL_OINTMENT | Freq: Once | CUTANEOUS | Status: AC
  Administered 2018-08-25: 1 via TOPICAL
  Filled 2018-08-24: qty 0.9

## 2018-08-24 NOTE — ED Provider Notes (Signed)
[facility] DEPT Provider Note   CSN: [id number] Arrival date & time: 08/24/18  2051    History   Chief Complaint Chief Complaint  Patient presents with  . Fall  . thumb injury    HPI Kenneth [name] is a 63 y.o. male.     HPI  63 year old male presents after a fall. Was using his crutches and stumbled while walking backwards while talking. Injured left hand, left buttocks. EMS reports chin laceration, patient denies feeling pain in chin or head/neck injury. Is on baby ASA. Pain in hand is severe. Pain in buttocks similar to injury suffered recently with ala fracture. No weakness/numbness. No preceding illness or near syncope/syncope.  Past Medical History:  Diagnosis Date  . Achalasia   . Candida infection, esophageal ([place]) 08/2010   a. 2012 - noted on EGD.  Marland Kitchen DVT (deep venous thrombosis) ([place]) ~ 04/2014   ?RLE  . Dysphagia 2008   post heller myotomy/toupee fundoplication.    . ESRD (end stage renal disease) on dialysis [facility])    "MWF: [name]" (01/22/2017)  . Fall 01/22/2017   mechanical fall w/multiple fractures/notes 01/22/2017  . GERD (gastroesophageal reflux disease)    hx (01/22/2017)  . Hemorrhoids, internal   . History of blood transfusion 03/2014   "low counts"  . History of gout   . History of stomach ulcers   . HIV infection ([place]) dx ~ 2009   a. 02/13/2014 CD4 = 240 - undetectable viral load.  . Hyperlipidemia   . Hypertension    hx (01/22/2017)  . Insomnia   . Lupus nephritis ([place])   . Pancytopenia ([place])   . Premature ventricular contractions   . Renal abscess   . SLE (systemic lupus erythematosus) ([place]) dx'd 2015    Patient Active Problem List   Diagnosis Date Noted  . Closed nondisplaced zone I fracture of sacrum ([place]) 08/22/2018  . Tachycardia 08/22/2018  . Suprapubic pain 04/11/2018  . Osteoporosis 03/15/2017  . Health care maintenance 06/30/2016  . Chronic fatigue 03/11/2015  . Exacerbation of systemic  lupus ([place]) 02/24/2015  . Dysphagia   . ESRD (end stage renal disease) ([place])   . DVT (deep venous thrombosis) ([place]) 06/22/2014  . PVC's (premature ventricular contractions) 03/17/2014  . Abdominal pain   . Protein-calorie malnutrition, severe ([place]) 02/11/2014  . Lupus nephritis ([place])   . Arthralgia of multiple sites, bilateral 02/02/2014  . Bilateral low back pain without sciatica 02/01/2014  . EXTERNAL HEMORRHOIDS WITHOUT MENTION COMP 01/20/2009  . HIV disease ([place]) 07/24/2006  . Hyperlipidemia 05/31/2006  . ERECTILE DYSFUNCTION 05/31/2006  . INSOMNIA NOS 05/31/2006    Past Surgical History:  Procedure Laterality Date  . BASCILIC VEIN TRANSPOSITION Left 06/29/2014   Procedure: FIRST STAGE  [place];  Surgeon: Rosetta Posner, MD;  Location: [facility];  Service: Vascular;  Laterality: Left;  . BASCILIC VEIN TRANSPOSITION Left 09/11/2014   Procedure: SECOND STAGE BASILIC VEIN TRANSPOSITION LEFT UPPER ARM;  Surgeon: Rosetta Posner, MD;  Location: [facility];  Service: Vascular;  Laterality: Left;  . COLONOSCOPY  2008   .  tortuous colon, internal hemorrhoids.  no polyps or diverticulosis.   Marland Kitchen ESOPHAGOGASTRODUODENOSCOPY  08/2010   Dr [name].  08/2010 candida esophagitis, no obvious esophageal stricture. 02/2006 and 05/2006 dilated esophagus, narrowed distal esophagus but no stricture, was empirically [place] dilated  . ESOPHAGOGASTRODUODENOSCOPY N/A 06/27/2014   Procedure: ESOPHAGOGASTRODUODENOSCOPY (EGD);  Surgeon: [name], MD;  Location: [facility];  Service: Endoscopy;  Laterality: N/A;  . ESOPHAGOGASTRODUODENOSCOPY N/A 07/02/2014   Procedure: ESOPHAGOGASTRODUODENOSCOPY (EGD);  Surgeon: [name], MD;  [facility];  Service: Endoscopy;  Laterality: N/A;  with botox  . [place]   with toupee fundoplication of HH.   . INSERTION OF DIALYSIS CATHETER Right 06/29/2014   Procedure: INSERTION OF DIALYSIS CATHETER RIGHT IJ;  Surgeon: Rosetta Posner, MD;  Location: [facility];  Service: Vascular;  Laterality: Right;  . NEPHRECTOMY Right ~ 2011  . ORIF TIBIA PLATEAU Right 01/23/2017   Procedure: OPEN REDUCTION INTERNAL FIXATION (ORIF) TIBIAL PLATEAU; REPAIR OF LATERAL TIBIAL PLATEAU; ARTHROTOMY WITH MENISCUS REPAIR; ANTERIOR COMPARTMENT FASCIOTOMY;  Surgeon: Altamese Tallassee, MD;  Location: [facility];  Service: Orthopedics;  Laterality: Right;        Home Medications    Prior to Admission medications   Medication Sig Start Date End Date Taking? Authorizing Provider  abacavir-dolutegravir-lamiVUDine (TRIUMEQ) 600-50-300 MG tablet Take 1 tablet by mouth daily. 03/21/18   [place] Callas, NP  acetaminophen (TYLENOL) 500 MG tablet Take 1 tablet (500 mg total) by mouth every 6 (six) hours as needed for up to 7 days. 08/17/18 08/24/18  Janeece Fitting, PA-C  amitriptyline (ELAVIL) 10 MG tablet Take 1 tablet (10 mg total) by mouth at bedtime. 02/02/17   Angiulli, Lavon Paganini, PA-C  calcitRIOL (ROCALTROL) 0.25 MCG capsule Take 3 capsules (0.75 mcg total) by mouth every Monday, Wednesday, and Friday with hemodialysis. 02/02/17   Angiulli, Lavon Paganini, PA-C  calcium acetate (PHOSLO) 667 MG capsule Take 2 capsules (1,334 mg total) by mouth 3 (three) times daily with meals. 02/02/17   Angiulli, Lavon Paganini, PA-C  docusate sodium (COLACE) 100 MG capsule Take 1 capsule (100 mg total) by mouth daily as needed for mild constipation. 01/27/17   Eloise Levels, MD  HYDROcodone-acetaminophen ([place]) 7.5-325 MG tablet Take 2 tablets by mouth every 4 (four) hours as needed for severe pain ((score 7 to 10)). 02/02/17   Angiulli, Lavon Paganini, PA-C  hydroxychloroquine (PLAQUENIL) 200 MG tablet Take 1 tablet (200 mg total) by mouth 2 (two) times daily. 02/02/17   Angiulli, Lavon Paganini, PA-C  Multiple Vitamin (MULTIVITAMIN WITH MINERALS) TABS tablet Take 1 tablet by mouth daily. 01/28/17   Eloise Levels, MD  multivitamin (RENA-VIT) TABS tablet Take by mouth. 11/06/16   [provider]  oxyCODONE  (ROXICODONE) 5 MG immediate release tablet Take 1 tablet (5 mg total) by mouth every 4 (four) hours as needed for severe pain. 08/22/18   Matilde Haymaker, MD  polyethylene glycol [facility] / Floria Raveling) packet Take 17 g by mouth 2 (two) times daily. 01/27/17   Eloise Levels, MD  polyethylene glycol powder (GLYCOLAX/MIRALAX) 17 GM/SCOOP powder Take 17 g by mouth 2 (two) times daily as needed. 08/22/18   Matilde Haymaker, MD  predniSONE (DELTASONE) 10 MG tablet Take 1 tablet (10 mg total) by mouth daily. 02/02/17   Angiulli, Lavon Paganini, PA-C  sildenafil (VIAGRA) 25 MG tablet Take 1 tablet (25 mg total) by mouth daily as needed for erectile dysfunction. 04/11/18 05/11/18  [name], MD    Family History Family History  Problem Relation Age of Onset  . Colon cancer Father        deceased  . Other Mother        s/p pacemaker - alive and well.  . Hypertension Mother   . Other Other        5 brothers, 3 sisters - alive  and well.    Social History Social History   Tobacco Use  . Smoking status: Never Smoker  . Smokeless tobacco: Never Used  Substance Use Topics  . Alcohol use: No  . Drug use: No     Allergies   Patient has no known allergies.   Review of Systems Review of Systems  Constitutional: Negative for fever.  Respiratory: Negative for shortness of breath.   Cardiovascular: Negative for chest pain.  Gastrointestinal: Negative for vomiting.  Musculoskeletal: Positive for arthralgias and joint swelling. Negative for neck pain.  Skin: Positive for wound.  Neurological: Negative for weakness, numbness and headaches.  All other systems reviewed and are negative.    Physical Exam Updated Vital Signs BP (!) 141/87 (BP Location: Right Arm)   Pulse 85   Temp 98.5 F (36.9 C) (Oral)   Resp 19   Ht 5\' 11"  (1.803 m)   Wt 58.1 kg   SpO2 100%   BMI 17.85 kg/m   Physical Exam Vitals signs and nursing note reviewed.  Constitutional:      Appearance: He is well-developed.      Interventions: Cervical collar in place.  HENT:     Head: Normocephalic.      Comments: No facial tenderness, including jaw. No head trauma besides chin lac.    Right Ear: External ear normal.     Left Ear: External ear normal.     Nose: Nose normal.  Eyes:     General:        Right eye: No discharge.        Left eye: No discharge.  Neck:     Musculoskeletal: Normal range of motion and neck supple. No spinous process tenderness or muscular tenderness.  Cardiovascular:     Rate and Rhythm: Regular rhythm. Tachycardia present.     Pulses:          Radial pulses are 2+ on the left side.     Heart sounds: Normal heart sounds.  Pulmonary:     Effort: Pulmonary effort is normal.     Breath sounds: Normal breath sounds.  Abdominal:     Palpations: Abdomen is soft.     Tenderness: There is no abdominal tenderness.  Musculoskeletal:     Left hip: He exhibits tenderness.     Left hand: He exhibits tenderness and deformity. He exhibits no laceration. Normal sensation noted.       Legs:     Comments: ROM of left hip is painful but is able to do it. Pain is all posterior. See pic of left hand, closed thumb dislocation. There is dried blood to tip of thumb but no laceration, likely from touching chin  Skin:    General: Skin is warm and dry.  Neurological:     Mental Status: He is alert.  Psychiatric:        Mood and Affect: Mood is not anxious.        ED Treatments / Results  Labs (all labs ordered are listed, but only abnormal results are displayed) Labs Reviewed - No data to display  EKG None  Radiology Dg Hand Complete Left  Result Date: 08/24/2018 CLINICAL DATA:  Recent trip and fall with thumb deformity, initial encounter EXAM: LEFT HAND - COMPLETE 3+ VIEW COMPARISON:  None. FINDINGS: There is dislocation at the first MCP joint with lateral displacement of the proximal phalanx with respect to the metacarpal. No acute fracture is seen. No soft tissue abnormality is noted.  IMPRESSION:  Dislocation at the first MCP joint. Electronically Signed   By: [name] M.D.   On: 08/24/2018 22:16   Dg Finger Thumb Left  Result Date: 08/24/2018 CLINICAL DATA:  Post reduction radiographs EXAM: LEFT THUMB 2+V COMPARISON:  08/24/2018 FINDINGS: The patient is status post reduction at the first metacarpophalangeal joint. The alignment appears significantly improved. There may be some mild palmar subluxation. There is no evidence of a displaced fracture. There is surrounding soft tissue swelling. IMPRESSION: Improved alignment status post reduction. No evidence of a displaced fracture. Electronically Signed   By: Constance Holster M.D.   On: 08/24/2018 23:28   Dg Hip Unilat W Or Wo Pelvis 2-3 Views Left  Result Date: 08/24/2018 CLINICAL DATA:  Recent fall with left hip pain, initial encounter EXAM: DG HIP (WITH OR WITHOUT PELVIS) 3V LEFT COMPARISON:  08/17/2018 FINDINGS: There is no evidence of hip fracture or dislocation. There is no evidence of arthropathy or other focal bone abnormality. IMPRESSION: No acute abnormality noted. Electronically Signed   By: [name] M.D.   On: 08/24/2018 22:17    Procedures Reduction of dislocation Date/Time: 08/25/2018 12:07 AM Performed by: Sherwood Gambler, MD Authorized by: Sherwood Gambler, MD  Consent: Verbal consent obtained. Written consent obtained. Risks and benefits: risks, benefits and alternatives were discussed Consent given by: patient Patient identity confirmed: verbally with patient Preparation: Patient was prepped and draped in the usual sterile fashion. Local anesthesia used: yes Anesthesia: local infiltration  Anesthesia: Local anesthesia used: yes Local Anesthetic: lidocaine 2% with epinephrine  Sedation: Patient sedated: no  Patient tolerance: Patient tolerated the procedure well with no immediate complications Comments: Ring block applied with local anesthetic. Closed reduction without any acute complication.  Full ROM after reduction  .Marland KitchenLaceration Repair Date/Time: 08/25/2018 12:08 AM Performed by: Sherwood Gambler, MD Authorized by: Sherwood Gambler, MD   Consent:    Consent obtained:  Verbal   Consent given by:  Patient Anesthesia (see MAR for exact dosages):    Anesthesia method:  Local infiltration   Local anesthetic:  Lidocaine 2% WITH epi Laceration details:    Location:  Face   Face location:  Chin   Length (cm):  1.5 Repair type:    Repair type:  Simple Pre-procedure details:    Preparation:  Patient was prepped and draped in usual sterile fashion Exploration:    Wound exploration: wound explored through full range of motion   Treatment:    Amount of cleaning:  Standard   Irrigation solution:  Sterile saline   Irrigation method:  Syringe Skin repair:    Repair method:  Sutures   Suture size:  5-0   Suture material:  Plain gut   Suture technique:  Simple interrupted   Number of sutures:  3 Approximation:    Approximation:  Close Post-procedure details:    Dressing:  Antibiotic ointment   Patient tolerance of procedure:  Tolerated well, no immediate complications   (including critical care time)  Medications Ordered in ED Medications  lidocaine-EPINEPHrine (XYLOCAINE W/EPI) 2 %-1:200000 (PF) injection 10 mL (has no administration in time range)  fentaNYL (SUBLIMAZE) injection 100 mcg (has no administration in time range)     Initial Impression / Assessment and Plan / ED Course  I have reviewed the triage vital signs and the nursing notes.  Pertinent labs & imaging results that were available during my care of the patient were reviewed by me and considered in my medical decision making (see chart for details).  Patient with mechanical fall and hand/hip injury. He did not even notice chin laceration. Doubt acute fracture of jaw or head/c-spine injury. No imaging is indicated. D/w Dr. Lenon Curt, advised reduction, f/u in 3 days. Thumb spica. Chin repaired as  above. Was a mechanical fall. I think his "hip pain" is buttock pain, will ambulate after xrays. Care to Dr. Dina Rich.  Final Clinical Impressions(s) / ED Diagnoses   Final diagnoses:  Fall, initial encounter  Closed dislocation of metacarpophalangeal joint of left thumb, initial encounter  Chin laceration, initial encounter    ED Discharge Orders    None       Sherwood Gambler, MD 08/25/18 0021

## 2018-08-24 NOTE — ED Notes (Signed)
Bed: KB52 Expected date:  Expected time:  Means of arrival:  Comments: 63 yo fall

## 2018-08-24 NOTE — ED Notes (Signed)
EDP consulted to hand surgery.

## 2018-08-24 NOTE — ED Triage Notes (Addendum)
Arrives via EMS from home, C/C fall, hit the ground, hit chin on granite table. L thumb abnormalities, deformity noted. Complains of pain in L hip. Use crutches to ambulate with. No LOC, no head neck or back pain. Hx of kidney transplant in February.

## 2018-08-24 NOTE — ED Notes (Signed)
Patient's L thumb has obvious deformity, good capillary refill, pain 8/10.

## 2018-08-24 NOTE — ED Notes (Signed)
Patient transported to X-ray via stretcher 

## 2018-08-25 ENCOUNTER — Emergency Department (HOSPITAL_COMMUNITY): Payer: Medicare Other

## 2018-08-25 DIAGNOSIS — Z905 Acquired absence of kidney: Secondary | ICD-10-CM | POA: Diagnosis not present

## 2018-08-25 DIAGNOSIS — I10 Essential (primary) hypertension: Secondary | ICD-10-CM | POA: Diagnosis present

## 2018-08-25 DIAGNOSIS — R5382 Chronic fatigue, unspecified: Secondary | ICD-10-CM | POA: Diagnosis present

## 2018-08-25 DIAGNOSIS — S62509A Fracture of unspecified phalanx of unspecified thumb, initial encounter for closed fracture: Secondary | ICD-10-CM | POA: Diagnosis present

## 2018-08-25 DIAGNOSIS — S32119A Unspecified Zone I fracture of sacrum, initial encounter for closed fracture: Secondary | ICD-10-CM | POA: Diagnosis present

## 2018-08-25 DIAGNOSIS — Z8249 Family history of ischemic heart disease and other diseases of the circulatory system: Secondary | ICD-10-CM | POA: Diagnosis not present

## 2018-08-25 DIAGNOSIS — Y92009 Unspecified place in unspecified non-institutional (private) residence as the place of occurrence of the external cause: Secondary | ICD-10-CM | POA: Diagnosis not present

## 2018-08-25 DIAGNOSIS — S329XXS Fracture of unspecified parts of lumbosacral spine and pelvis, sequela: Secondary | ICD-10-CM

## 2018-08-25 DIAGNOSIS — S63115A Dislocation of metacarpophalangeal joint of left thumb, initial encounter: Secondary | ICD-10-CM | POA: Diagnosis present

## 2018-08-25 DIAGNOSIS — Z1159 Encounter for screening for other viral diseases: Secondary | ICD-10-CM | POA: Diagnosis not present

## 2018-08-25 DIAGNOSIS — S0181XA Laceration without foreign body of other part of head, initial encounter: Secondary | ICD-10-CM | POA: Diagnosis present

## 2018-08-25 DIAGNOSIS — Z940 Kidney transplant status: Secondary | ICD-10-CM | POA: Diagnosis not present

## 2018-08-25 DIAGNOSIS — W010XXA Fall on same level from slipping, tripping and stumbling without subsequent striking against object, initial encounter: Secondary | ICD-10-CM | POA: Diagnosis present

## 2018-08-25 DIAGNOSIS — R296 Repeated falls: Secondary | ICD-10-CM

## 2018-08-25 DIAGNOSIS — S329XXA Fracture of unspecified parts of lumbosacral spine and pelvis, initial encounter for closed fracture: Secondary | ICD-10-CM | POA: Diagnosis present

## 2018-08-25 DIAGNOSIS — M3214 Glomerular disease in systemic lupus erythematosus: Secondary | ICD-10-CM | POA: Diagnosis present

## 2018-08-25 DIAGNOSIS — E43 Unspecified severe protein-calorie malnutrition: Secondary | ICD-10-CM | POA: Diagnosis present

## 2018-08-25 DIAGNOSIS — Z7952 Long term (current) use of systemic steroids: Secondary | ICD-10-CM | POA: Diagnosis not present

## 2018-08-25 DIAGNOSIS — M8580 Other specified disorders of bone density and structure, unspecified site: Secondary | ICD-10-CM | POA: Diagnosis present

## 2018-08-25 DIAGNOSIS — Z86718 Personal history of other venous thrombosis and embolism: Secondary | ICD-10-CM | POA: Diagnosis not present

## 2018-08-25 DIAGNOSIS — Z21 Asymptomatic human immunodeficiency virus [HIV] infection status: Secondary | ICD-10-CM | POA: Diagnosis present

## 2018-08-25 LAB — URINALYSIS, ROUTINE W REFLEX MICROSCOPIC
Bilirubin Urine: NEGATIVE
Glucose, UA: NEGATIVE mg/dL
Hgb urine dipstick: NEGATIVE
Ketones, ur: NEGATIVE mg/dL
Leukocytes,Ua: NEGATIVE
Nitrite: NEGATIVE
Protein, ur: NEGATIVE mg/dL
Specific Gravity, Urine: 1.025 (ref 1.005–1.030)
pH: 5 (ref 5.0–8.0)

## 2018-08-25 LAB — BASIC METABOLIC PANEL
Anion gap: 7 (ref 5–15)
BUN: 16 mg/dL (ref 8–23)
CO2: 24 mmol/L (ref 22–32)
Calcium: 8.6 mg/dL — ABNORMAL LOW (ref 8.9–10.3)
Chloride: 108 mmol/L (ref 98–111)
Creatinine, Ser: 1.21 mg/dL (ref 0.61–1.24)
GFR calc Af Amer: >60 mL/min (ref >60)
GFR calc non Af Amer: >60 mL/min (ref >60)
Glucose, Bld: 98 mg/dL (ref 70–99)
Potassium: 3.8 mmol/L (ref 3.5–5.1)
Sodium: 139 mmol/L (ref 135–145)

## 2018-08-25 LAB — CBC WITH DIFFERENTIAL/PLATELET
Abs Immature Granulocytes: 0.74 10*3/uL — ABNORMAL HIGH (ref 0.00–0.07)
Basophils Absolute: 0 10*3/uL (ref 0.0–0.1)
Basophils Relative: 1 %
Eosinophils Absolute: 0.1 10*3/uL (ref 0.0–0.5)
Eosinophils Relative: 1 %
HCT: 46.1 % (ref 39.0–52.0)
Hemoglobin: 14.1 g/dL (ref 13.0–17.0)
Immature Granulocytes: 15 %
Lymphocytes Relative: 11 %
Lymphs Abs: 0.5 10*3/uL — ABNORMAL LOW (ref 0.7–4.0)
MCH: 31.9 pg (ref 26.0–34.0)
MCHC: 30.6 g/dL (ref 30.0–36.0)
MCV: 104.3 fL — ABNORMAL HIGH (ref 80.0–100.0)
Monocytes Absolute: 0.4 10*3/uL (ref 0.1–1.0)
Monocytes Relative: 9 %
Neutro Abs: 3 10*3/uL (ref 1.7–7.7)
Neutrophils Relative %: 63 %
Platelets: 185 10*3/uL (ref 150–400)
RBC: 4.42 MIL/uL (ref 4.22–5.81)
RDW: 13.3 % (ref 11.5–15.5)
WBC: 4.8 10*3/uL (ref 4.0–10.5)
nRBC: 0 % (ref 0.0–0.2)

## 2018-08-25 LAB — SARS CORONAVIRUS 2 BY RT PCR (HOSPITAL ORDER, PERFORMED IN ~~LOC~~ HOSPITAL LAB): SARS Coronavirus 2: NEGATIVE

## 2018-08-25 MED ORDER — TACROLIMUS 1 MG PO CAPS
1.5000 mg | ORAL_CAPSULE | Freq: Two times a day (BID) | ORAL | Status: DC
  Administered 2018-08-25 – 2018-08-27 (×5): 1.5 mg via ORAL
  Filled 2018-08-25 (×5): qty 1

## 2018-08-25 MED ORDER — MYCOPHENOLATE MOFETIL 250 MG PO CAPS
250.0000 mg | ORAL_CAPSULE | Freq: Two times a day (BID) | ORAL | Status: DC
  Administered 2018-08-25 – 2018-08-27 (×5): 250 mg via ORAL
  Filled 2018-08-25 (×5): qty 1

## 2018-08-25 MED ORDER — HYDROMORPHONE HCL 1 MG/ML IJ SOLN
1.0000 mg | INTRAMUSCULAR | Status: DC | PRN
  Administered 2018-08-25 (×2): 1 mg via INTRAVENOUS
  Filled 2018-08-25 (×2): qty 1

## 2018-08-25 MED ORDER — SODIUM CHLORIDE 0.9% FLUSH
3.0000 mL | Freq: Two times a day (BID) | INTRAVENOUS | Status: DC
  Administered 2018-08-25 – 2018-08-27 (×5): 3 mL via INTRAVENOUS

## 2018-08-25 MED ORDER — CYCLOBENZAPRINE HCL 5 MG PO TABS
5.0000 mg | ORAL_TABLET | Freq: Three times a day (TID) | ORAL | Status: DC | PRN
  Administered 2018-08-25 – 2018-08-27 (×3): 5 mg via ORAL
  Filled 2018-08-25 (×3): qty 1

## 2018-08-25 MED ORDER — ABACAVIR-DOLUTEGRAVIR-LAMIVUD 600-50-300 MG PO TABS
1.0000 | ORAL_TABLET | Freq: Every day | ORAL | Status: DC
  Administered 2018-08-25 – 2018-08-27 (×3): 1 via ORAL
  Filled 2018-08-25 (×4): qty 1

## 2018-08-25 MED ORDER — AMITRIPTYLINE HCL 10 MG PO TABS
10.0000 mg | ORAL_TABLET | Freq: Every day | ORAL | Status: DC
  Administered 2018-08-25 – 2018-08-26 (×2): 10 mg via ORAL
  Filled 2018-08-25 (×2): qty 1

## 2018-08-25 MED ORDER — SODIUM CHLORIDE 0.9 % IV SOLN: 250.0000 mL | INTRAVENOUS | Status: DC | PRN

## 2018-08-25 MED ORDER — ONDANSETRON HCL 4 MG/2ML IJ SOLN: 4.0000 mg | Freq: Four times a day (QID) | INTRAMUSCULAR | Status: DC | PRN

## 2018-08-25 MED ORDER — OXYCODONE HCL 5 MG PO TABS
5.0000 mg | ORAL_TABLET | ORAL | Status: DC | PRN
  Administered 2018-08-25: 09:00:00 5 mg via ORAL
  Filled 2018-08-25: qty 1

## 2018-08-25 MED ORDER — HYDROXYCHLOROQUINE SULFATE 200 MG PO TABS
200.0000 mg | ORAL_TABLET | Freq: Two times a day (BID) | ORAL | Status: DC
  Administered 2018-08-25 – 2018-08-27 (×5): 200 mg via ORAL
  Filled 2018-08-25 (×6): qty 1

## 2018-08-25 MED ORDER — ASPIRIN EC 81 MG PO TBEC
81.0000 mg | DELAYED_RELEASE_TABLET | Freq: Every day | ORAL | Status: DC
  Administered 2018-08-25 – 2018-08-27 (×3): 81 mg via ORAL
  Filled 2018-08-25 (×3): qty 1

## 2018-08-25 MED ORDER — TAMSULOSIN HCL 0.4 MG PO CAPS
0.4000 mg | ORAL_CAPSULE | Freq: Every day | ORAL | Status: DC
  Administered 2018-08-25 – 2018-08-27 (×3): 0.4 mg via ORAL
  Filled 2018-08-25 (×3): qty 1

## 2018-08-25 MED ORDER — PREDNISONE 5 MG PO TABS
10.0000 mg | ORAL_TABLET | Freq: Every day | ORAL | Status: DC
  Administered 2018-08-25 – 2018-08-27 (×3): 10 mg via ORAL
  Filled 2018-08-25 (×3): qty 2

## 2018-08-25 MED ORDER — FENTANYL CITRATE (PF) 100 MCG/2ML IJ SOLN
50.0000 ug | Freq: Once | INTRAMUSCULAR | Status: AC
  Administered 2018-08-25: 02:00:00 50 ug via INTRAVENOUS
  Filled 2018-08-25: qty 2

## 2018-08-25 MED ORDER — SODIUM CHLORIDE 0.9% FLUSH: 3.0000 mL | INTRAVENOUS | Status: DC | PRN

## 2018-08-25 MED ORDER — HYDROCODONE-ACETAMINOPHEN 7.5-325 MG PO TABS
2.0000 | ORAL_TABLET | ORAL | Status: DC | PRN
  Administered 2018-08-26: 10:00:00 2 via ORAL
  Filled 2018-08-25: qty 2

## 2018-08-25 MED ORDER — ONDANSETRON HCL 4 MG PO TABS: 4.0000 mg | ORAL_TABLET | Freq: Four times a day (QID) | ORAL | Status: DC | PRN

## 2018-08-25 NOTE — H&P (Signed)
History and Physical    Kenneth [name] ESP:[id number] DOB: 1955/06/23 DOA: 08/24/2018  PCP: [name], MD  Patient coming from:  home  Chief Complaint:  Fall, pelvic hip pain  HPI: Kenneth [name] is a 63 y.o. male with medical history significant of HIV, malnourished, ESRD prev on dialysis, lupus with nephritis, comes in with several falls this week, had another one tonight injuring his pelvis and hip more than the one he had 3 days ago.  Ct shows new s2 fx and pelvic fx, also fractured his left thumb.  No loc.  No urinary symptoms.  No sob, cough, fever.  No n/v/d.  Pt can not ambulate in the ED due to significant pain and therefore referred for admission for pain control and rehab.  Also having a lot of muscle spasms to his left upper thigh.  Review of Systems: As per HPI otherwise 10 point review of systems negative.   Past Medical History:  Diagnosis Date  . Achalasia   . Candida infection, esophageal ([place]) 08/2010   a. 2012 - noted on EGD.  Marland Kitchen DVT (deep venous thrombosis) ([place]) ~ 04/2014   ?RLE  . Dysphagia 2008   post heller myotomy/toupee fundoplication.    . ESRD (end stage renal disease) on dialysis [facility])    "MWF: [name]" (01/22/2017)  . Fall 01/22/2017   mechanical fall w/multiple fractures/notes 01/22/2017  . GERD (gastroesophageal reflux disease)    hx (01/22/2017)  . Hemorrhoids, internal   . History of blood transfusion 03/2014   "low counts"  . History of gout   . History of stomach ulcers   . HIV infection ([place]) dx ~ 2009   a. 02/13/2014 CD4 = 240 - undetectable viral load.  . Hyperlipidemia   . Hypertension    hx (01/22/2017)  . Insomnia   . Lupus nephritis ([place])   . Pancytopenia ([place])   . Premature ventricular contractions   . Renal abscess   . SLE (systemic lupus erythematosus) ([place]) dx'd 2015    Past Surgical History:  Procedure Laterality Date  . BASCILIC VEIN TRANSPOSITION Left 06/29/2014   Procedure: FIRST STAGE  [place];  Surgeon: Rosetta Posner, MD;  Location: [place];  Service: Vascular;  Laterality: Left;  . BASCILIC VEIN TRANSPOSITION Left 09/11/2014   Procedure: SECOND STAGE BASILIC VEIN TRANSPOSITION LEFT UPPER ARM;  Surgeon: Rosetta Posner, MD;  Location: [place];  Service: Vascular;  Laterality: Left;  . COLONOSCOPY  2008   .  tortuous colon, internal hemorrhoids.  no polyps or diverticulosis.   Marland Kitchen ESOPHAGOGASTRODUODENOSCOPY  08/2010   Dr [name].  08/2010 candida esophagitis, no obvious esophageal stricture. 02/2006 and 05/2006 dilated esophagus, narrowed distal esophagus but no stricture, was empirically Savary dilated  . ESOPHAGOGASTRODUODENOSCOPY N/A 06/27/2014   Procedure: ESOPHAGOGASTRODUODENOSCOPY (EGD);  Surgeon: [name], MD;  Location[facility]COPY;  Service: Endoscopy;  Laterality: N/A;  . ESOPHAGOGASTRODUODENOSCOPY N/A 07/02/2014   Procedure: ESOPHAGOGASTRODUODENOSCOPY (EGD);  Surgeon: [name], MD;  Location: [facility] ENDOSCOPY;  Service: Endoscopy;  Laterality: N/A;  with botox  . [place]   with toupee fundoplication of HH.   . INSERTION OF DIALYSIS CATHETER Right 06/29/2014   Procedure: INSERTION OF DIALYSIS CATHETER RIGHT IJ;  Surgeon: Rosetta Posner, MD;  Location: [place];  Service: Vascular;  Laterality: Right;  . NEPHRECTOMY Right ~ 2011  . ORIF TIBIA PLATEAU Right 01/23/2017   Procedure: OPEN REDUCTION INTERNAL FIXATION (ORIF) TIBIAL PLATEAU; REPAIR OF LATERAL  TIBIAL PLATEAU; ARTHROTOMY WITH MENISCUS REPAIR; ANTERIOR COMPARTMENT FASCIOTOMY;  Surgeon: Altamese Beaconsfield, MD;  Location: [place];  Service: Orthopedics;  Laterality: Right;     reports that he has never smoked. He has never used smokeless tobacco. He reports that he does not drink alcohol or use drugs.  No Known Allergies  Family History  Problem Relation Age of Onset  . Colon cancer Father        deceased  . Other Mother        s/p pacemaker - alive and well.  . Hypertension Mother   . Other Other        5  brothers, 3 sisters - alive and well.    Prior to Admission medications   Medication Sig Start Date End Date Taking? Authorizing Provider  abacavir-dolutegravir-lamiVUDine (TRIUMEQ) 600-50-300 MG tablet Take 1 tablet by mouth daily. 03/21/18   Shawano Callas, NP  amitriptyline (ELAVIL) 10 MG tablet Take 1 tablet (10 mg total) by mouth at bedtime. 02/02/17   Angiulli, Lavon Paganini, PA-C  calcitRIOL (ROCALTROL) 0.25 MCG capsule Take 3 capsules (0.75 mcg total) by mouth every Monday, Wednesday, and Friday with hemodialysis. 02/02/17   Angiulli, Lavon Paganini, PA-C  calcium acetate (PHOSLO) 667 MG capsule Take 2 capsules (1,334 mg total) by mouth 3 (three) times daily with meals. 02/02/17   Angiulli, Lavon Paganini, PA-C  docusate sodium (COLACE) 100 MG capsule Take 1 capsule (100 mg total) by mouth daily as needed for mild constipation. 01/27/17   Eloise Levels, MD  HYDROcodone-acetaminophen ([place]) 7.5-325 MG tablet Take 2 tablets by mouth every 4 (four) hours as needed for severe pain ((score 7 to 10)). 02/02/17   Angiulli, Lavon Paganini, PA-C  hydroxychloroquine (PLAQUENIL) 200 MG tablet Take 1 tablet (200 mg total) by mouth 2 (two) times daily. 02/02/17   Angiulli, Lavon Paganini, PA-C  Multiple Vitamin (MULTIVITAMIN WITH MINERALS) TABS tablet Take 1 tablet by mouth daily. 01/28/17   Eloise Levels, MD  multivitamin (RENA-VIT) TABS tablet Take by mouth. 11/06/16   [provider]  oxyCODONE (ROXICODONE) 5 MG immediate release tablet Take 1 tablet (5 mg total) by mouth every 4 (four) hours as needed for severe pain. 08/22/18   Matilde Haymaker, MD  polyethylene glycol [facility]) packet Take 17 g by mouth 2 (two) times daily. 01/27/17   Eloise Levels, MD  polyethylene glycol powder (GLYCOLAX/MIRALAX) 17 GM/SCOOP powder Take 17 g by mouth 2 (two) times daily as needed. 08/22/18   Matilde Haymaker, MD  predniSONE (DELTASONE) 10 MG tablet Take 1 tablet (10 mg total) by mouth daily. 02/02/17   Angiulli, Lavon Paganini,  PA-C  sildenafil (VIAGRA) 25 MG tablet Take 1 tablet (25 mg total) by mouth daily as needed for erectile dysfunction. 04/11/18 05/11/18  [name], MD    Physical Exam: Vitals:   08/25/18 0300 08/25/18 0330 08/25/18 0400 08/25/18 0430  BP: (!) 144/92 (!) 151/94 (!) 138/93 (!) 143/92  Pulse: (!) 110 (!) 107 (!) 106 (!) 109  Resp: 16 15 16 17   Temp:      TempSrc:      SpO2: 98% 100% 100% 100%  Weight:      Height:         Constitutional: NAD, calm, comfortable chronically ill appearing Vitals:   08/25/18 0300 08/25/18 0330 08/25/18 0400 08/25/18 0430  BP: (!) 144/92 (!) 151/94 (!) 138/93 (!) 143/92  Pulse: (!) 110 (!) 107 (!) 106 (!) 109  Resp: 16  15 16 17   Temp:      TempSrc:      SpO2: 98% 100% 100% 100%  Weight:      Height:       Eyes: PERRL, lids and conjunctivae normal ENMT: Mucous membranes are moist. Posterior pharynx clear of any exudate or lesions.Normal dentition.  Neck: normal, supple, no masses, no thyromegaly Respiratory: clear to auscultation bilaterally, no wheezing, no crackles. Normal respiratory effort. No accessory muscle use.  Cardiovascular: Regular rate and rhythm, no murmurs / rubs / gallops. No extremity edema. 2+ pedal pulses. No carotid bruits.  Abdomen: no tenderness, no masses palpated. No hepatosplenomegaly. Bowel sounds positive.  Musculoskeletal: no clubbing / cyanosis. No joint deformity upper and lower extremities. decreased ROM, no contractures. Normal muscle tone.  Skin: no rashes, lesions, ulcers. No induration Neurologic: CN 2-12 grossly intact. Sensation intact, DTR normal. Strength 5/5 in all 4.  Psychiatric: Normal judgment and insight. Alert and oriented x 3. Normal mood.    Labs on Admission: I have personally reviewed following labs and imaging studies  CBC: Recent Labs  Lab 08/25/18 0208  WBC 4.8  NEUTROABS 3.0  HGB 14.1  HCT 46.1  MCV 104.3*  PLT 938   Basic Metabolic Panel: Recent Labs  Lab 08/25/18 0208  NA 139   K 3.8  CL 108  CO2 24  GLUCOSE 98  BUN 16  CREATININE 1.21  CALCIUM 8.6*   GFR: Estimated Creatinine Clearance: 52.7 mL/min (by C-G formula based on SCr of 1.21 mg/dL). Liver Function Tests: No results for input(s): AST, ALT, ALKPHOS, BILITOT, PROT, ALBUMIN in the last 168 hours. No results for input(s): LIPASE, AMYLASE in the last 168 hours. No results for input(s): AMMONIA in the last 168 hours. Coagulation Profile: No results for input(s): INR, PROTIME in the last 168 hours. Cardiac Enzymes: No results for input(s): CKTOTAL, CKMB, CKMBINDEX, TROPONINI in the last 168 hours. BNP (last 3 results) No results for input(s): PROBNP in the last 8760 hours. HbA1C: No results for input(s): HGBA1C in the last 72 hours. CBG: No results for input(s): GLUCAP in the last 168 hours. Lipid Profile: No results for input(s): CHOL, HDL, LDLCALC, TRIG, CHOLHDL, LDLDIRECT in the last 72 hours. Thyroid Function Tests: No results for input(s): TSH, T4TOTAL, FREET4, T3FREE, THYROIDAB in the last 72 hours. Anemia Panel: No results for input(s): VITAMINB12, FOLATE, FERRITIN, TIBC, IRON, RETICCTPCT in the last 72 hours. Urine analysis:    Component Value Date/Time   COLORURINE YELLOW 08/25/2018 0208   APPEARANCEUR HAZY (A) 08/25/2018 0208   LABSPEC 1.025 08/25/2018 0208   PHURINE 5.0 08/25/2018 0208   GLUCOSEU NEGATIVE 08/25/2018 0208   GLUCOSEU NEG mg/dL 08/16/2006 2308   HGBUR NEGATIVE 08/25/2018 0208   BILIRUBINUR NEGATIVE 08/25/2018 0208   BILIRUBINUR neg 03/05/2014 1214   KETONESUR NEGATIVE 08/25/2018 0208   PROTEINUR NEGATIVE 08/25/2018 0208   UROBILINOGEN 0.2 07/04/2014 0022   NITRITE NEGATIVE 08/25/2018 0208   LEUKOCYTESUR NEGATIVE 08/25/2018 0208   Sepsis Labs: !!!!!!!!!!!!!!!!!!!!!!!!!!!!!!!!!!!!!!!!!!!! @LABRCNTIP (procalcitonin:4,lacticidven:4) )No results found for this or any previous visit (from the past 240 hour(s)).   Radiological Exams on Admission: Ct Pelvis Wo  Contrast  Result Date: 08/25/2018 CLINICAL DATA:  Left posterior hip pain status post trauma EXAM: CT PELVIS WITHOUT CONTRAST TECHNIQUE: Multidetector CT imaging of the pelvis was performed following the standard protocol without intravenous contrast. COMPARISON:  08/17/2018 FINDINGS: Urinary Tract: There is mild asymmetric bladder wall thickening. 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  Bowel: There is a large amount of stool in the colon. The appendix is not reliably identified. Vascular/Lymphatic: No pathologically enlarged lymph nodes. No significant vascular abnormality seen. Reproductive:  The prostate gland is significantly enlarged. Other:  None. Musculoskeletal: There are new acute minimally displaced bilateral sacral ala fractures. There is a compression fracture of the S2 vertebral body. There is diffuse osteopenia. IMPRESSION: 1. New acute minimally displaced fractures involving the bilateral sacral ala. 2. New compression fracture of the S2 vertebral body. 3. Diffuse osteopenia. 4. Slightly asymmetric bladder wall thickening which is nonspecific. Correlation with urinalysis is recommended. Outpatient urology follow-up is recommended. There is diffuse prostatomegaly. Electronically Signed   By: Constance Holster M.D.   On: 08/25/2018 01:41   Dg Hand Complete Left  Result Date: 08/24/2018 CLINICAL DATA:  Recent trip and fall with thumb deformity, initial encounter EXAM: LEFT HAND - COMPLETE 3+ VIEW COMPARISON:  None. FINDINGS: There is dislocation at the first MCP joint with lateral displacement of the proximal phalanx with respect to the metacarpal. No acute fracture is seen. No soft tissue abnormality is noted. IMPRESSION: Dislocation at the first MCP joint. Electronically Signed   By: [name] M.D.   On: 08/24/2018 22:16   Dg Finger Thumb Left  Result Date: 08/24/2018 CLINICAL DATA:  Post reduction radiographs EXAM: LEFT THUMB 2+V COMPARISON:   08/24/2018 FINDINGS: The patient is status post reduction at the first metacarpophalangeal joint. The alignment appears significantly improved. There may be some mild palmar subluxation. There is no evidence of a displaced fracture. There is surrounding soft tissue swelling. IMPRESSION: Improved alignment status post reduction. No evidence of a displaced fracture. Electronically Signed   By: Constance Holster M.D.   On: 08/24/2018 23:28   Dg Hip Unilat W Or Wo Pelvis 2-3 Views Left  Result Date: 08/24/2018 CLINICAL DATA:  Recent fall with left hip pain, initial encounter EXAM: DG HIP (WITH OR WITHOUT PELVIS) 3V LEFT COMPARISON:  08/17/2018 FINDINGS: There is no evidence of hip fracture or dislocation. There is no evidence of arthropathy or other focal bone abnormality. IMPRESSION: No acute abnormality noted. Electronically Signed   By: [name] M.D.   On: 08/24/2018 22:17   Old chart reviewed Case discussed with edp   Assessment/Plan 63 yo male with multiple med problems with freq falls with new s2 fx, left thumb fracture (s/p reduction in the ed), sacral fx unable to ambulate due to pain Principal Problem:   Pelvic fracture ([place])- orttho to see in am. PT eval.  Cont home pain meds and add iv dilaudid prn with zofran prn.  Refer for rehab.  Active Problems:   Multiple falls- PT    HIV disease ([place])- cont home meds    Protein-calorie malnutrition, severe ([place])- noted    Lupus nephritis ([place])- noted    ESRD (end stage renal disease) ([place])- renal fxn good now, avoid nephrotoxic meds    Chronic fatigue- noted    Thumb fracture- in spica, outpt follow up with hand surgery   Med rec pending pharm review   DVT prophylaxis: scds Code Status: full Family Communication: none Disposition Plan:  Pending rehab Consults called: orthopedic Admission status:  admit   Ajia Chadderdon A MD [facility]  If 7PM-7AM, please contact night-coverage [email or web address] Password [facility]   08/25/2018, 4:58 AM

## 2018-08-25 NOTE — Consult Note (Signed)
Orthopaedic Trauma Service (OTS) Consult   Patient ID: Kenneth [name] MRN: [id number] DOB/AGE: June 15, 1955 63 y.o.  Reason for Consult: Left hip pain/ sacral ala fractures Referring Physician: [facility]  HPI: Kenneth [name] is an 63 y.o. male being seen in consultation at the request of Dr. Dina Rich for left hip pain. Patient presented to [facility] yesterday evening after a fall at home. Had severe pain in the left hip and difficulty ambulating following the injury. Was found to have bilateral sacral ala fractures on imaging. Orthopaedic trauma service consulted. Patient admitted to hospitalist service.  Patient with history of several falls, most recently about a week ago when he again fell on this left hip. This resulted in a nondisplaced left sacral fracture. States the pain in his buttocks now is similar to this recent injury.  Patient doing okay this morning, pain currently well controlled at rest. States left buttock pain is worse than right. Is having frequent muscle spasms in left buttock that shoots down leg. Has flexeril ordered, first dose not started yet. He denies any numbness or tingling in the legs.   Past Medical History:  Diagnosis Date  . Achalasia   . Candida infection, esophageal ([place]) 08/2010   a. 2012 - noted on EGD.  Marland Kitchen DVT (deep venous thrombosis) ([place]) ~ 04/2014   ?RLE  . Dysphagia 2008   post heller myotomy/toupee fundoplication.    . ESRD (end stage renal disease) on dialysis [facility])    "MWF: [name]" (01/22/2017)  . Fall 01/22/2017   mechanical fall w/multiple fractures/notes 01/22/2017  . GERD (gastroesophageal reflux disease)    hx (01/22/2017)  . Hemorrhoids, internal   . History of blood transfusion 03/2014   "low counts"  . History of gout   . History of stomach ulcers   . HIV infection ([place]) dx ~ 2009   a. 02/13/2014 CD4 = 240 - undetectable viral load.  . Hyperlipidemia   . Hypertension    hx (01/22/2017)  . Insomnia   . Lupus  nephritis ([place])   . Pancytopenia ([place])   . Premature ventricular contractions   . Renal abscess   . SLE (systemic lupus erythematosus) ([place]) dx'd 2015    Past Surgical History:  Procedure Laterality Date  . BASCILIC VEIN TRANSPOSITION Left 06/29/2014   Procedure: FIRST STAGE  [place];  Surgeon: Rosetta Posner, MD;  Location: [place];  Service: Vascular;  Laterality: Left;  . BASCILIC VEIN TRANSPOSITION Left 09/11/2014   Procedure: SECOND STAGE BASILIC VEIN TRANSPOSITION LEFT UPPER ARM;  Surgeon: Rosetta Posner, MD;  Location: [place];  Service: Vascular;  Laterality: Left;  . COLONOSCOPY  2008   .  tortuous colon, internal hemorrhoids.  no polyps or diverticulosis.   Marland Kitchen ESOPHAGOGASTRODUODENOSCOPY  08/2010   Dr [name].  08/2010 candida esophagitis, no obvious esophageal stricture. 02/2006 and 05/2006 dilated esophagus, narrowed distal esophagus but no stricture, was empirically Savary dilated  . ESOPHAGOGASTRODUODENOSCOPY N/A 06/27/2014   Procedure: ESOPHAGOGASTRODUODENOSCOPY (EGD);  Surgeon: [name], MD;  Location[facility] ENDOSCOPY;  Service: Endoscopy;  Laterality: N/A;  . ESOPHAGOGASTRODUODENOSCOPY N/A 07/02/2014   Procedure: ESOPHAGOGASTRODUODENOSCOPY (EGD);  Surgeon: [name], MD;  Location[facility]COPY;  Service: Endoscopy;  Laterality: N/A;  with botox  . [place]   with toupee fundoplication of HH.   . INSERTION OF DIALYSIS CATHETER Right 06/29/2014   Procedure: INSERTION OF DIALYSIS CATHETER RIGHT IJ;  Surgeon: Rosetta Posner, MD;  Location: [place];  Service: Vascular;  Laterality: Right;  . NEPHRECTOMY Right ~ 2011  . ORIF TIBIA PLATEAU Right 01/23/2017   Procedure: OPEN REDUCTION INTERNAL FIXATION (ORIF) TIBIAL PLATEAU; REPAIR OF LATERAL TIBIAL PLATEAU; ARTHROTOMY WITH MENISCUS REPAIR; ANTERIOR COMPARTMENT FASCIOTOMY;  Surgeon: Altamese [place], MD;  Location: [place];  Service: Orthopedics;  Laterality: Right;    Family History  Problem Relation Age of Onset  .  Colon cancer Father        deceased  . Other Mother        s/p pacemaker - alive and well.  . Hypertension Mother   . Other Other        5 brothers, 3 sisters - alive and well.    Social History:  reports that he has never smoked. He has never used smokeless tobacco. He reports that he does not drink alcohol or use drugs.  Allergies: No Known Allergies  Medications: I have reviewed the patient's current medications.  ROS: Constitutional: No fever or chills Vision: No changes in vision ENT: No difficulty swallowing CV: No chest pain Pulm: No SOB or wheezing GI: No nausea or vomiting GU: No urgency or inability to hold urine Skin: No poor wound healing Neurologic: No numbness or tingling Psychiatric: No depression or anxiety Heme: No bruising Allergic: No reaction to medications or food   Exam: Blood pressure (!) 132/95, pulse (!) 112, temperature 100.1 F (37.8 C), temperature source Oral, resp. rate 18, height 5\' 11"  (1.803 m), weight 58.1 kg, SpO2 99 %. General: Resting in bed comfortably, NAD. Pleasant and cooperative Orientation:Awake, alert, oriented Mood and Affect: Mood and affect appropriate Gait: Not assessed, about to get up with PT Coordination and balance: Within normal limits  Cardiac: Heart regular rhythm, slightly tachycardic  Respiratory: no increased work of breathing, lungs CTA anterior lung fields.  Right Lower Extremity: Skin without lesions. Non tender to palpation of low back, buttock, hip, thigh. Full knee ROM without discomfort. Full strength in each muscle group without evidence of instability. Motor and sensory function intact distally. Neurovascularly intact.  Left Lower Extremity: Skin without lesions. No bruising noted. Minimal tenderness to palpation of buttock. Non-tender in hip, thigh, or knee. Able to perform ROM of left hip but this creates some posterior pain when doing so. Full knee ROM without pain. Motor and sensory function intact  distally. +DP pulse   Medical Decision Making: Imaging: CT of pelvis showed minimally displaced fractures involving the bilateral sacral ala as well as a compression fracture of the S2 vertebral body.  Labs:  Results for orders placed or performed during the hospital encounter of 08/24/18 (from the past 24 hour(s))  Urinalysis, Routine w reflex microscopic     Status: Abnormal   Collection Time: 08/25/18  2:08 AM  Result Value Ref Range   Color, Urine YELLOW YELLOW   APPearance HAZY (A) CLEAR   Specific Gravity, Urine 1.025 1.005 - 1.030   pH 5.0 5.0 - 8.0   Glucose, UA NEGATIVE NEGATIVE mg/dL   Hgb urine dipstick NEGATIVE NEGATIVE   Bilirubin Urine NEGATIVE NEGATIVE   Ketones, ur NEGATIVE NEGATIVE mg/dL   Protein, ur NEGATIVE NEGATIVE mg/dL   Nitrite NEGATIVE NEGATIVE   Leukocytes,Ua NEGATIVE NEGATIVE  CBC with Differential     Status: Abnormal   Collection Time: 08/25/18  2:08 AM  Result Value Ref Range   WBC 4.8 4.0 - 10.5 K/uL   RBC 4.42 4.22 - 5.81 MIL/uL   Hemoglobin 14.1 13.0 - 17.0 g/dL   HCT  46.1 39.0 - 52.0 %   MCV 104.3 (H) 80.0 - 100.0 fL   MCH 31.9 26.0 - 34.0 pg   MCHC 30.6 30.0 - 36.0 g/dL   RDW 13.3 11.5 - 15.5 %   Platelets 185 150 - 400 K/uL   nRBC 0.0 0.0 - 0.2 %   Neutrophils Relative % 63 %   Neutro Abs 3.0 1.7 - 7.7 K/uL   Lymphocytes Relative 11 %   Lymphs Abs 0.5 (L) 0.7 - 4.0 K/uL   Monocytes Relative 9 %   Monocytes Absolute 0.4 0.1 - 1.0 K/uL   Eosinophils Relative 1 %   Eosinophils Absolute 0.1 0.0 - 0.5 K/uL   Basophils Relative 1 %   Basophils Absolute 0.0 0.0 - 0.1 K/uL   Immature Granulocytes 15 %   Abs Immature Granulocytes 0.74 (H) 0.00 - 0.07 K/uL  Basic metabolic panel     Status: Abnormal   Collection Time: 08/25/18  2:08 AM  Result Value Ref Range   Sodium 139 135 - 145 mmol/L   Potassium 3.8 3.5 - 5.1 mmol/L   Chloride 108 98 - 111 mmol/L   CO2 24 22 - 32 mmol/L   Glucose, Bld 98 70 - 99 mg/dL   BUN 16 8 - 23 mg/dL    Creatinine, Ser 1.21 0.61 - 1.24 mg/dL   Calcium 8.6 (L) 8.9 - 10.3 mg/dL   GFR calc non Af Amer >60 >60 mL/min   GFR calc Af Amer >60 >60 mL/min   Anion gap 7 5 - 15  SARS Coronavirus 2 (CEPHEID - Performed in [facility] hospital lab), Hosp Order     Status: None   Collection Time: 08/25/18  4:27 AM  Result Value Ref Range   SARS Coronavirus 2 NEGATIVE NEGATIVE    Medical history and chart was reviewed  Assessment/Plan: 63 year old male s/p fall, resulting in bilateral sacral ala fractures.  With the minimal displacement of the fractures, I do not feel that surgical intervention is needed at this time. Patient can be weightbearing as tolerated on bilateral lower extremities. He will need to work with physical therapy. He will likely need metabolic bone workup as this is a fragility fracture and consistent with osteoporosis. Orthopaedics will sign off, please re-consult if necessary.   Caoilainn Sacks A. [facility] ?[phone]? (phone)
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
## 2018-08-25 NOTE — Progress Notes (Signed)
OT Cancellation Note  Patient Details Name: Kenneth [name] MRN: [id number] DOB: 1955/10/08   Cancelled Treatment:    Reason Eval/Treat Not Completed: Other (comment) OT order received, met with pt. Pt just back to bed and eating lunch, with increase in pain, requesting to hold OT at this time. Will continue to follow up with pt to initiate OT services as available and appropriate.\   Zenovia Jarred, MSOT, OTR/L Behavioral Health OT/ Acute Relief OT [facility] Office: [place] 08/25/2018, 12:26 PM

## 2018-08-25 NOTE — Consult Note (Signed)
Discussed case with Dr. Dina Rich. 63 year old with fall and pelvic pain. Imaging reviewed shows CT scan with U-type sacral insufficiency fracture. Will not recommend surgical intervention currently but will need mobilization with PT and pain control. Patient maybe WBAT BLE. Formal consult to follow later today. Will need bone metabolic workup as this is fragility fracture and consistent with osteoporosis.  Shona Needles, MD [facility] [phone] (phone) [phone] (office) [email or web address]

## 2018-08-25 NOTE — ED Notes (Signed)
ED TO INPATIENT HANDOFF REPORT  ED Nurse Name and Phone #: [place] G, [phone]  S Name/Age/Gender Kenneth [name] 63 y.o. male Room/Bed: WA11/WA11  Code Status   Code Status: Full Code  Home/SNF/Other Home Patient oriented to: self, place, time and situation Is this baseline? Yes   Triage Complete: Triage complete  Chief Complaint Fall Dislocation of Left Thumb lacerationof chin and Left Hip Pain  Triage Note Arrives via EMS from home, C/C fall, hit the ground, hit chin on granite table. L thumb abnormalities, deformity noted. Complains of pain in L hip. Use crutches to ambulate with. No LOC, no head neck or back pain. Hx of kidney transplant in February.    Allergies No Known Allergies  Level of Care/Admitting Diagnosis ED Disposition    ED Disposition Condition Comment   Admit  Hospital Area: [place] [[id number]]  Level of Care: Med-Surg [16]  Covid Evaluation: Screening Protocol (No Symptoms)  Diagnosis: Pelvic fracture [facility]) [[id number]]  Admitting Physician: Phillips Grout [[id number]]  Attending Physician: Derrill Kay A [[id number]]  Estimated length of stay: 3 - 4 days  Certification:: I certify this patient will need inpatient services for at least 2 midnights  PT Class (Do Not Modify): Inpatient [101]  PT Acc Code (Do Not Modify): Private [1]       B Medical/Surgery History Past Medical History:  Diagnosis Date  . Achalasia   . Candida infection, esophageal ([place]) 08/2010   a. 2012 - noted on EGD.  Marland Kitchen DVT (deep venous thrombosis) ([place]) ~ 04/2014   ?RLE  . Dysphagia 2008   post heller myotomy/toupee fundoplication.    . ESRD (end stage renal disease) on dialysis [facility])    "MWF: [name]" (01/22/2017)  . Fall 01/22/2017   mechanical fall w/multiple fractures/notes 01/22/2017  . GERD (gastroesophageal reflux disease)    hx (01/22/2017)  . Hemorrhoids, internal   . History of blood transfusion 03/2014   "low counts"  . History of gout   .  History of stomach ulcers   . HIV infection ([place]) dx ~ 2009   a. 02/13/2014 CD4 = 240 - undetectable viral load.  . Hyperlipidemia   . Hypertension    hx (01/22/2017)  . Insomnia   . Lupus nephritis ([place])   . Pancytopenia ([place])   . Premature ventricular contractions   . Renal abscess   . SLE (systemic lupus erythematosus) ([place]) dx'd 2015   Past Surgical History:  Procedure Laterality Date  . BASCILIC VEIN TRANSPOSITION Left 06/29/2014   Procedure: FIRST STAGE  [place];  Surgeon: Rosetta Posner, MD;  Location: [place];  Service: Vascular;  Laterality: Left;  . BASCILIC VEIN TRANSPOSITION Left 09/11/2014   Procedure: SECOND STAGE BASILIC VEIN TRANSPOSITION LEFT UPPER ARM;  Surgeon: Rosetta Posner, MD;  Location: [place];  Service: Vascular;  Laterality: Left;  . COLONOSCOPY  2008   .  tortuous colon, internal hemorrhoids.  no polyps or diverticulosis.   Marland Kitchen ESOPHAGOGASTRODUODENOSCOPY  08/2010   Dr [name].  08/2010 candida esophagitis, no obvious esophageal stricture. 02/2006 and 05/2006 dilated esophagus, narrowed distal esophagus but no stricture, was empirically Savary dilated  . ESOPHAGOGASTRODUODENOSCOPY N/A 06/27/2014   Procedure: ESOPHAGOGASTRODUODENOSCOPY (EGD);  Surgeon: [name], MD;  Location[facility] ENDOSCOPY;  Service: Endoscopy;  Laterality: N/A;  . ESOPHAGOGASTRODUODENOSCOPY N/A 07/02/2014   Procedure: ESOPHAGOGASTRODUODENOSCOPY (EGD);  Surgeon: [name], MD;  Location: [facility] ENDOSCOPY;  Service: Endoscopy;  Laterality: N/A;  with botox  . HELLER MYOTOMY  1999   with toupee fundoplication of HH.   . INSERTION OF DIALYSIS CATHETER Right 06/29/2014   Procedure: INSERTION OF DIALYSIS CATHETER RIGHT IJ;  Surgeon: Rosetta Posner, MD;  Location: [place];  Service: Vascular;  Laterality: Right;  . NEPHRECTOMY Right ~ 2011  . ORIF TIBIA PLATEAU Right 01/23/2017   Procedure: OPEN REDUCTION INTERNAL FIXATION (ORIF) TIBIAL PLATEAU; REPAIR OF LATERAL TIBIAL PLATEAU; ARTHROTOMY WITH  MENISCUS REPAIR; ANTERIOR COMPARTMENT FASCIOTOMY;  Surgeon: Altamese Beaverdam, MD;  Location: [place];  Service: Orthopedics;  Laterality: Right;     A IV Location/Drains/Wounds Patient Lines/Drains/Airways Status   Active Line/Drains/Airways    Name:   Placement date:   Placement time:   Site:   Days:   Peripheral IV 08/24/18 Left Antecubital   08/24/18    2210    Antecubital   1   Fistula / Graft Left Upper arm Arteriovenous fistula   06/29/14    1432    Upper arm   1518   Hemodialysis Catheter Right Internal jugular Double-lumen   06/29/14    1252    Internal jugular   1518   Incision (Closed) 06/29/14 Arm Left;Upper;Lower   06/29/14    1434     1518   Incision (Closed) 09/11/14 Arm Left   09/11/14    1023     1444   Incision (Closed) 01/23/17 Leg Right   01/23/17    1351     579          Intake/Output Last 24 hours No intake or output data in the 24 hours ending 08/25/18 0539  Labs/Imaging Results for orders placed or performed during the hospital encounter of 08/24/18 (from the past 48 hour(s))  Urinalysis, Routine w reflex microscopic     Status: Abnormal   Collection Time: 08/25/18  2:08 AM  Result Value Ref Range   Color, Urine YELLOW YELLOW   APPearance HAZY (A) CLEAR   Specific Gravity, Urine 1.025 1.005 - 1.030   pH 5.0 5.0 - 8.0   Glucose, UA NEGATIVE NEGATIVE mg/dL   Hgb urine dipstick NEGATIVE NEGATIVE   Bilirubin Urine NEGATIVE NEGATIVE   Ketones, ur NEGATIVE NEGATIVE mg/dL   Protein, ur NEGATIVE NEGATIVE mg/dL   Nitrite NEGATIVE NEGATIVE   Leukocytes,Ua NEGATIVE NEGATIVE    Comment: Performed at [facility], [place] [address], [place], [place] [address]  CBC with Differential     Status: Abnormal   Collection Time: 08/25/18  2:08 AM  Result Value Ref Range   WBC 4.8 4.0 - 10.5 K/uL   RBC 4.42 4.22 - 5.81 MIL/uL   Hemoglobin 14.1 13.0 - 17.0 g/dL   HCT 46.1 39.0 - 52.0 %   MCV 104.3 (H) 80.0 - 100.0 fL   MCH 31.9 26.0 - 34.0 pg   MCHC 30.6  30.0 - 36.0 g/dL   RDW 13.3 11.5 - 15.5 %   Platelets 185 150 - 400 K/uL   nRBC 0.0 0.0 - 0.2 %   Neutrophils Relative % 63 %   Neutro Abs 3.0 1.7 - 7.7 K/uL   Lymphocytes Relative 11 %   Lymphs Abs 0.5 (L) 0.7 - 4.0 K/uL   Monocytes Relative 9 %   Monocytes Absolute 0.4 0.1 - 1.0 K/uL   Eosinophils Relative 1 %   Eosinophils Absolute 0.1 0.0 - 0.5 K/uL   Basophils Relative 1 %   Basophils Absolute 0.0 0.0 - 0.1 K/uL   Immature Granulocytes 15 %   Abs  Immature Granulocytes 0.74 (H) 0.00 - 0.07 K/uL    Comment: Performed at [facility], [place] [address] [place], [place] [address]  Basic metabolic panel     Status: Abnormal   Collection Time: 08/25/18  2:08 AM  Result Value Ref Range   Sodium 139 135 - 145 mmol/L   Potassium 3.8 3.5 - 5.1 mmol/L   Chloride 108 98 - 111 mmol/L   CO2 24 22 - 32 mmol/L   Glucose, Bld 98 70 - 99 mg/dL   BUN 16 8 - 23 mg/dL   Creatinine, Ser 1.21 0.61 - 1.24 mg/dL   Calcium 8.6 (L) 8.9 - 10.3 mg/dL   GFR calc non Af Amer >60 >60 mL/min   GFR calc Af Amer >60 >60 mL/min   Anion gap 7 5 - 15    Comment: Performed at [facility], [place] [address] [place], [place] [address]   Ct Pelvis Wo Contrast  Result Date: 08/25/2018 CLINICAL DATA:  Left posterior hip pain status post trauma EXAM: CT PELVIS WITHOUT CONTRAST TECHNIQUE: Multidetector CT imaging of the pelvis was performed following the standard protocol without intravenous contrast. COMPARISON:  08/17/2018 FINDINGS: Urinary Tract: There is mild asymmetric bladder wall thickening. 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 Bowel: There is a large amount of stool in the colon. The appendix is not reliably identified. Vascular/Lymphatic: No pathologically enlarged lymph nodes. No significant vascular abnormality seen. Reproductive:  The prostate gland is significantly enlarged. Other:  None. Musculoskeletal: There are new acute minimally  displaced bilateral sacral ala fractures. There is a compression fracture of the S2 vertebral body. There is diffuse osteopenia. IMPRESSION: 1. New acute minimally displaced fractures involving the bilateral sacral ala. 2. New compression fracture of the S2 vertebral body. 3. Diffuse osteopenia. 4. Slightly asymmetric bladder wall thickening which is nonspecific. Correlation with urinalysis is recommended. Outpatient urology follow-up is recommended. There is diffuse prostatomegaly. Electronically Signed   By: Constance Holster M.D.   On: 08/25/2018 01:41   Dg Hand Complete Left  Result Date: 08/24/2018 CLINICAL DATA:  Recent trip and fall with thumb deformity, initial encounter EXAM: LEFT HAND - COMPLETE 3+ VIEW COMPARISON:  None. FINDINGS: There is dislocation at the first MCP joint with lateral displacement of the proximal phalanx with respect to the metacarpal. No acute fracture is seen. No soft tissue abnormality is noted. IMPRESSION: Dislocation at the first MCP joint. Electronically Signed   By: [name] M.D.   On: 08/24/2018 22:16   Dg Finger Thumb Left  Result Date: 08/24/2018 CLINICAL DATA:  Post reduction radiographs EXAM: LEFT THUMB 2+V COMPARISON:  08/24/2018 FINDINGS: The patient is status post reduction at the first metacarpophalangeal joint. The alignment appears significantly improved. There may be some mild palmar subluxation. There is no evidence of a displaced fracture. There is surrounding soft tissue swelling. IMPRESSION: Improved alignment status post reduction. No evidence of a displaced fracture. Electronically Signed   By: Constance Holster M.D.   On: 08/24/2018 23:28   Dg Hip Unilat W Or Wo Pelvis 2-3 Views Left  Result Date: 08/24/2018 CLINICAL DATA:  Recent fall with left hip pain, initial encounter EXAM: DG HIP (WITH OR WITHOUT PELVIS) 3V LEFT COMPARISON:  08/17/2018 FINDINGS: There is no evidence of hip fracture or dislocation. There is no evidence of arthropathy or  other focal bone abnormality. IMPRESSION: No acute abnormality noted. Electronically Signed   By: Linus Mako.D.  On: 08/24/2018 22:17    Pending Labs Unresulted Labs (From admission, onward)    Start     Ordered   08/25/18 0427  SARS Coronavirus 2 (CEPHEID - Performed in [facility] hospital lab), Hosp Order  (Asymptomatic Patients Labs)  Once,   R    Question:  Rule Out  Answer:  Yes   08/25/18 0426          Vitals/Pain Today's Vitals   08/25/18 0400 08/25/18 0430 08/25/18 0500 08/25/18 0530  BP: (!) 138/93 (!) 143/92 (!) 145/90 (!) 146/90  Pulse: (!) 106 (!) 109 (!) 110 (!) 113  Resp: 16 17 16 18   Temp:      TempSrc:      SpO2: 100% 100% 100% 100%  Weight:      Height:      PainSc:        Isolation Precautions No active isolations  Medications Medications  oxyCODONE (Oxy IR/ROXICODONE) immediate release tablet 5 mg (has no administration in time range)  abacavir-dolutegravir-lamiVUDine (TRIUMEQ) 600-50-300 MG per tablet 1 tablet (has no administration in time range)  hydroxychloroquine (PLAQUENIL) tablet 200 mg (has no administration in time range)  sodium chloride flush (NS) 0.9 % injection 3 mL (has no administration in time range)  sodium chloride flush (NS) 0.9 % injection 3 mL (has no administration in time range)  0.9 %  sodium chloride infusion (has no administration in time range)  ondansetron (ZOFRAN) tablet 4 mg (has no administration in time range)    Or  ondansetron (ZOFRAN) injection 4 mg (has no administration in time range)  HYDROmorphone (DILAUDID) injection 1 mg (has no administration in time range)  cyclobenzaprine (FLEXERIL) tablet 5 mg (has no administration in time range)  lidocaine-EPINEPHrine (XYLOCAINE W/EPI) 2 %-1:200000 (PF) injection 10 mL (10 mLs Intradermal Given 08/24/18 2214)  fentaNYL (SUBLIMAZE) injection 100 mcg (100 mcg Intravenous Given 08/24/18 2213)  lidocaine-EPINEPHrine (XYLOCAINE W/EPI) 2 %-1:200000 (PF) injection 10 mL (10  mLs Intradermal Given 08/25/18 0006)  bacitracin ointment (1 application Topical Given 08/25/18 0006)  fentaNYL (SUBLIMAZE) injection 50 mcg (50 mcg Intravenous Given 08/25/18 0158)    Mobility walks with device Low fall risk

## 2018-08-25 NOTE — ED Provider Notes (Signed)
Patient received in signout from Dr. Regenia Skeeter.  Postreduction films reassuring.  Unfortunately, with attempts to ambulation, patient was in too much pain and was very unsteady on his feet.  He was unable to use crutches and was unsteady with a walker.  He reports increasing left posterior hip pain and favors that hip.  Will obtain further imaging.  Patient was dosed pain medication.  2:48 AM Discussed patient with Dr. Doreatha Martin, orthopedics.  Recommends admission for PT. He will review images and leave a note.  He does not anticipate urgent need for surgery.  4:28 AM Basic lab work returned and largely reassuring including urinalysis.  Discussed with Dr. Shanon Brow for admission.  Coronavirus testing pending.  Patient was updated.   Merryl Hacker, MD 08/25/18 [phone]

## 2018-08-25 NOTE — Evaluation (Signed)
Physical Therapy Evaluation Patient Details Name: Kenneth [name] MRN: [id number] DOB: 17-Apr-1955 Today's Date: 08/25/2018   History of Present Illness  63 yo male admitted after falling multiple times at home. Pt sustained bil sacral ala fractures, S2 compression fx, and L 1st MCP dislocation s/p reduction. Hx of HIV, DVT, chronic fatigue, lupus, multiple fractures from repeated falls.   Clinical Impression  On eval, pt required Mod assist +2 for bed mobility and Min assist +2 for standing/ambulation. He was able to walk ~5 feet using a platform rolling walker on today. Mobility is limited by pain. Will continue to follow and progress activity as tolerated. Recommendation is for ST rehab at SNF if pt is agreeable.     Follow Up Recommendations SNF(if pt is agreeable. )    Equipment Recommendations  (continuing to assess. If pt discharges home, will need a RW and possibly a L platform. )    Recommendations for Other Services       Precautions / Restrictions Precautions Precautions: Fall Required Braces or Orthoses: Splint/Cast Splint/Cast: L hand/thumb Restrictions Other Position/Activity Restrictions: Unsure of WB status 2* thumb dislocation-using L PFRW to limit WBing on L hand currently. Pt is WBAT bil LEs      Mobility  Bed Mobility Overal bed mobility: Needs Assistance Bed Mobility: Supine to Sit     Supine to sit: Mod assist;+2 for physical assistance;+2 for safety/equipment;HOB elevated     General bed mobility comments: Assist for bil LEs and to scoot to EOB. Increased time. Task was effortful and painful for pt.   Transfers Overall transfer level: Needs assistance Equipment used: Left platform walker Transfers: Sit to/from Stand;Stand Pivot Transfers Sit to Stand: Min assist;+2 safety/equipment;+2 physical assistance;From elevated surface Stand pivot transfers: Min assist;+2 safety/equipment       General transfer comment: Assist to rise, stabilize, control  descent. VCs safety, technique, hand palcement. Stand pivot using L PFRW.  Ambulation/Gait Ambulation/Gait assistance: Min assist;+2 safety/equipment Gait Distance (Feet): 5 Feet Assistive device: Left platform walker Gait Pattern/deviations: Step-to pattern;Trunk flexed     General Gait Details: Assist to stabilize pt throughout short distance. Followed with recliner for safety. Limited distance due to pt's breakfast arrived at start fo session  Stairs            Wheelchair Mobility    Modified Rankin (Stroke Patients Only)       Balance Overall balance assessment: Needs assistance;History of Falls         Standing balance support: Bilateral upper extremity supported Standing balance-Leahy Scale: Poor                               Pertinent Vitals/Pain Pain Assessment: 0-10 Pain Score: 5  Pain Location: sacral area and L thumb Pain Descriptors / Indicators: Grimacing;Aching Pain Intervention(s): Limited activity within patient's tolerance;Repositioned    Home Living Family/patient expects to be discharged to:: Private residence Living Arrangements: Non-relatives/Friends Available Help at Discharge: Available 24 hours/day Type of Home: Apartment Home Access: Level entry     Home Layout: One level Home Equipment: Cane - single point;Crutches;Shower seat - built in      Prior Function Level of Independence: Independent               Hand Dominance        Extremity/Trunk Assessment   Upper Extremity Assessment Upper Extremity Assessment: LUE deficits/detail LUE Deficits / Details: L hand/thumb in splint  Lower Extremity Assessment Lower Extremity Assessment: Generalized weakness    Cervical / Trunk Assessment Cervical / Trunk Assessment: Kyphotic  Communication   Communication: No difficulties  Cognition Arousal/Alertness: Awake/alert Behavior During Therapy: WFL for tasks assessed/performed Overall Cognitive Status:  Within Functional Limits for tasks assessed                                        General Comments      Exercises     Assessment/Plan    PT Assessment Patient needs continued PT services  PT Problem List Decreased strength;Decreased balance;Decreased range of motion;Decreased mobility;Decreased activity tolerance;Decreased knowledge of use of DME;Pain       PT Treatment Interventions DME instruction;Therapeutic activities;Gait training;Functional mobility training;Balance training;Patient/family education;Therapeutic exercise    PT Goals (Current goals can be found in the Care Plan section)  Acute Rehab PT Goals Patient Stated Goal: less pain. regain PLOF.  PT Goal Formulation: With patient Time For Goal Achievement: 09/08/18 Potential to Achieve Goals: Good    Frequency Min 3X/week   Barriers to discharge        Co-evaluation               AM-PAC PT "6 Clicks" Mobility  Outcome Measure Help needed turning from your back to your side while in a flat bed without using bedrails?: A Lot Help needed moving from lying on your back to sitting on the side of a flat bed without using bedrails?: A Lot Help needed moving to and from a bed to a chair (including a wheelchair)?: A Little Help needed standing up from a chair using your arms (e.g., wheelchair or bedside chair)?: A Lot Help needed to walk in hospital room?: A Little Help needed climbing 3-5 steps with a railing? : A Lot 6 Click Score: 14    End of Session Equipment Utilized During Treatment: Gait belt Activity Tolerance: Patient limited by pain Patient left: in chair;with call bell/phone within reach;with chair alarm set   PT Visit Diagnosis: Pain;Difficulty in walking, not elsewhere classified (R26.2);Repeated falls (R29.6);History of falling (Z91.81);Other abnormalities of gait and mobility (R26.89) Pain - part of body: (thumb, sacral area)    Time: 0912-0920 PT Time Calculation (min)  (ACUTE ONLY): 8 min   Charges:   PT Evaluation $PT Eval Moderate Complexity: [place], PT Acute Rehabilitation Services Pager: [phone] Office: [phone]

## 2018-08-26 MED ORDER — ENOXAPARIN SODIUM 40 MG/0.4ML ~~LOC~~ SOLN
40.0000 mg | SUBCUTANEOUS | Status: DC
  Administered 2018-08-26: 40 mg via SUBCUTANEOUS
  Filled 2018-08-26: qty 0.4

## 2018-08-26 MED ORDER — ADULT MULTIVITAMIN W/MINERALS CH
1.0000 | ORAL_TABLET | Freq: Every day | ORAL | Status: DC
  Administered 2018-08-26 – 2018-08-27 (×2): 1 via ORAL
  Filled 2018-08-26 (×2): qty 1

## 2018-08-26 NOTE — Progress Notes (Signed)
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  CT revealed acute minimally displaced fracture involving bilateral sacral alla, new compression fracture S2 vertebral body, diffuse osteopenia orthopedic surgery consulted.  Orthopedic surgery recommended weight-bear as tolerated, physical therapy evaluation.   New events last 24 hours / Subjective: Feeling well today, agreeable to SNF placement  Assessment & Plan:   Principal Problem:   Pelvic fracture ([place]) Active Problems:   HIV disease ([place])   Protein-calorie malnutrition, severe ([place])   Lupus nephritis ([place])   ESRD (end stage renal disease) ([place])   Chronic fatigue   Multiple falls   Thumb fracture   Bilateral sacral alla fracture, minimal displacement  Compression fracture S2 vertebral body Left thumb dislocation status post reduction -Follow-up with orthopedic surgery as an outpatient in 2 weeks  -Follow-up with hand surgery for left thumb dislocation status post reduction as an outpatient -Pain control -WBAT BLE  -PT recommending SNF placement, social work notified  HIV -Continue Triumeq   Lupus nephritis ESRD previously on dialysis status post renal transplant -Continue plaquenil, cellcept, prednisone, prograf   Asymmetric bladder wall thickening  -Will need outpatient urology follow-up. Incidental finding on CT pelvis    DVT prophylaxis: Lovenox Code Status: Full Family Communication: None Disposition Plan: Pending SNF placement    Consultants:   Orthopedic surgery  Procedures:   None   Antimicrobials:  Anti-infectives (From admission, onward)   Start     Dose/Rate Route Frequency Ordered Stop    08/25/18 1000  abacavir-dolutegravir-lamiVUDine (TRIUMEQ) 600-50-300 MG per tablet 1 tablet     1 tablet Oral Daily 08/25/18 0500     08/25/18 1000  hydroxychloroquine (PLAQUENIL) tablet 200 mg     200 mg Oral 2 times daily 08/25/18 0500          Objective: Vitals:   08/25/18 0646 08/25/18 1405 08/25/18 2019 08/26/18 0446  BP: (!) 132/95 (!) 141/89 (!) 124/91 123/83  Pulse: (!) 112 (!) 113 (!) 102 (!) 101  Resp: 18 17 16 18   Temp: 100.1 F (37.8 C) 99.1 F (37.3 C) 98.7 F (37.1 C) 98.5 F (36.9 C)  TempSrc: Oral Oral Oral Oral  SpO2: 99% 99% 99% 98%  Weight:      Height:        Intake/Output Summary (Last 24 hours) at 08/26/2018 1046 Last data filed at 08/26/2018 1005 Gross per 24 hour  Intake 243 ml  Output 720 ml  Net -477 ml   Filed Weights   08/24/18 2108  Weight: 58.1 kg    Examination:  General exam: Appears calm and comfortable  Respiratory system: Clear to auscultation. Respiratory effort normal. Cardiovascular system: S1 & S2 heard, RRR. No JVD, murmurs, rubs, gallops or clicks. No pedal edema. Gastrointestinal system: Abdomen is nondistended, soft and nontender. No organomegaly or masses felt. Normal bowel sounds heard. Central nervous system: Alert and oriented. No focal neurological deficits. Extremities: Symmetric  Skin: No rashes, lesions or ulcers Psychiatry: Judgement and insight appear normal. Mood & affect appropriate.   Data Reviewed: I have personally reviewed following labs and imaging studies  CBC: Recent Labs  Lab 08/25/18 0208  WBC 4.8  NEUTROABS 3.0  HGB 14.1  HCT 46.1  MCV 104.3*  PLT 401   Basic Metabolic Panel: Recent Labs  Lab 08/25/18 0208  NA 139  K 3.8  CL 108  CO2 24  GLUCOSE 98  BUN 16  CREATININE 1.21  CALCIUM 8.6*   GFR: Estimated Creatinine Clearance: 52.7 mL/min (by C-G formula based on SCr of 1.21 mg/dL). Liver Function Tests: No results for input(s): AST, ALT, ALKPHOS, BILITOT, PROT, ALBUMIN in the  last 168 hours. No results for input(s): LIPASE, AMYLASE in the last 168 hours. No results for input(s): AMMONIA in the last 168 hours. Coagulation Profile: No results for input(s): INR, PROTIME in the last 168 hours. Cardiac Enzymes: No results for input(s): CKTOTAL, CKMB, CKMBINDEX, TROPONINI in the last 168 hours. BNP (last 3 results) No results for input(s): PROBNP in the last 8760 hours. HbA1C: No results for input(s): HGBA1C in the last 72 hours. CBG: No results for input(s): GLUCAP in the last 168 hours. Lipid Profile: No results for input(s): CHOL, HDL, LDLCALC, TRIG, CHOLHDL, LDLDIRECT in the last 72 hours. Thyroid Function Tests: No results for input(s): TSH, T4TOTAL, FREET4, T3FREE, THYROIDAB in the last 72 hours. Anemia Panel: No results for input(s): VITAMINB12, FOLATE, FERRITIN, TIBC, IRON, RETICCTPCT in the last 72 hours. Sepsis Labs: No results for input(s): PROCALCITON, LATICACIDVEN in the last 168 hours.  Recent Results (from the past 240 hour(s))  SARS Coronavirus 2 (CEPHEID - Performed in [place] hospital lab), Hosp Order     Status: None   Collection Time: 08/25/18  4:27 AM  Result Value Ref Range Status   SARS Coronavirus 2 NEGATIVE NEGATIVE Final    Comment: (NOTE) If result is NEGATIVE SARS-CoV-2 target nucleic acids are NOT DETECTED. The SARS-CoV-2 RNA is generally detectable in upper and lower  respiratory specimens during the acute phase of infection. The lowest  concentration of SARS-CoV-2 viral copies this assay can detect is 250  copies / mL. A negative result does not preclude SARS-CoV-2 infection  and should not be used as the sole basis for treatment or other  patient management decisions.  A negative result may occur with  improper specimen collection / handling, submission of specimen other  than nasopharyngeal swab, presence of viral mutation(s) within the  areas targeted by this assay, and inadequate number of viral copies  (<250 copies  / mL). A negative result must be combined with clinical  observations, patient history, and epidemiological information. If result is POSITIVE SARS-CoV-2 target nucleic acids are DETECTED. The SARS-CoV-2 RNA is generally detectable in upper and lower  respiratory specimens dur ing the acute phase of infection.  Positive  results are indicative of active infection with SARS-CoV-2.  Clinical  correlation with patient history and other diagnostic information is  necessary to determine patient infection status.  Positive results do  not rule out bacterial infection or co-infection with other viruses. If result is PRESUMPTIVE POSTIVE SARS-CoV-2 nucleic acids MAY BE PRESENT.   A presumptive positive result was obtained on the submitted specimen  and confirmed on repeat testing.  While 2019 novel coronavirus  (SARS-CoV-2) nucleic acids may be present in the submitted sample  additional confirmatory testing may be necessary for epidemiological  and / or clinical management purposes  to differentiate between  SARS-CoV-2 and other Sarbecovirus currently known to infect humans.  If clinically indicated additional testing with an alternate test  methodology [phone]) is advised. The SARS-CoV-2 RNA is generally  detectable in upper and lower respiratory sp ecimens during the acute  phase of infection. The expected result is Negative. Fact Sheet for Patients:  StrictlyIdeas.no Fact Sheet for Healthcare Providers: BankingDealers.co.za This test is not yet approved or cleared by the [place] FDA and has been authorized for detection and/or diagnosis of SARS-CoV-2 by FDA under an Emergency Use Authorization (EUA).  This EUA will remain in effect (meaning this test can be used) for the duration of the COVID-19 declaration under Section 564(b)(1) of the Act, 21 U.S.C. section 360bbb-3(b)(1), unless the authorization is terminated or revoked  sooner. Performed at [facility], [place] [address] [place], [place] [address]        Radiology Studies: Ct Pelvis Wo Contrast  Result Date: 08/25/2018 CLINICAL DATA:  Left posterior hip pain status post trauma EXAM: CT PELVIS WITHOUT CONTRAST TECHNIQUE: Multidetector CT imaging of the pelvis was performed following the standard protocol without intravenous contrast. COMPARISON:  08/17/2018 FINDINGS: Urinary Tract: There is mild asymmetric bladder wall thickening. 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 Bowel: There is a large amount of stool in the colon. The appendix is not reliably identified. Vascular/Lymphatic: No pathologically enlarged lymph nodes. No significant vascular abnormality seen. Reproductive:  The prostate gland is significantly enlarged. Other:  None. Musculoskeletal: There are new acute minimally displaced bilateral sacral ala fractures. There is a compression fracture of the S2 vertebral body. There is diffuse osteopenia. IMPRESSION: 1. New acute minimally displaced fractures involving the bilateral sacral ala. 2. New compression fracture of the S2 vertebral body. 3. Diffuse osteopenia. 4. Slightly asymmetric bladder wall thickening which is nonspecific. Correlation with urinalysis is recommended. Outpatient urology follow-up is recommended. There is diffuse prostatomegaly. Electronically Signed   By: Constance Holster M.D.   On: 08/25/2018 01:41   Dg Hand Complete Left  Result Date: 08/24/2018 CLINICAL DATA:  Recent trip and fall with thumb deformity, initial encounter EXAM: LEFT HAND - COMPLETE 3+ VIEW COMPARISON:  None. FINDINGS: There is dislocation at the first MCP joint with lateral displacement of the proximal phalanx with respect to the metacarpal. No acute fracture is seen. No soft tissue abnormality is noted. IMPRESSION: Dislocation at the first MCP joint. Electronically Signed   By: [name] M.D.   On:  08/24/2018 22:16   Dg Finger Thumb Left  Result Date: 08/24/2018 CLINICAL DATA:  Post reduction radiographs EXAM: LEFT THUMB 2+V COMPARISON:  08/24/2018 FINDINGS: The patient is status post reduction at the first metacarpophalangeal joint. The alignment appears significantly improved. There may be some mild palmar subluxation. There is no evidence of a displaced fracture. There is surrounding soft tissue swelling. IMPRESSION: Improved alignment status post reduction. No evidence of a displaced fracture. Electronically Signed   By: Constance Holster M.D.   On: 08/24/2018 23:28   Dg Hip Unilat W Or Wo Pelvis 2-3 Views Left  Result Date: 08/24/2018 CLINICAL DATA:  Recent fall with left hip pain, initial encounter EXAM: DG HIP (WITH OR WITHOUT PELVIS) 3V LEFT COMPARISON:  08/17/2018 FINDINGS: There is no evidence of hip fracture or dislocation. There is no evidence of arthropathy or other focal bone abnormality. IMPRESSION: No acute abnormality noted. Electronically Signed   By: [name] M.D.   On: 08/24/2018 22:17      Scheduled Meds:  abacavir-dolutegravir-lamiVUDine  1 tablet Oral Daily   amitriptyline  10 mg Oral QHS   aspirin EC  81 mg Oral Daily   hydroxychloroquine  200 mg Oral BID   multivitamin with minerals  1 tablet  Oral Daily   mycophenolate  250 mg Oral Q12H   predniSONE  10 mg Oral Q breakfast   sodium chloride flush  3 mL Intravenous Q12H   tacrolimus  1.5 mg Oral BID   tamsulosin  0.4 mg Oral Daily   Continuous Infusions:  sodium chloride       LOS: 1 day    Time spent: 20  minutes   Dessa Phi, DO [facility] [email or web address] 08/26/2018, 10:46 AM

## 2018-08-26 NOTE — Progress Notes (Addendum)
Physical Therapy Treatment Patient Details Name: Kenneth [name] MRN: [id number] DOB: 07/08/1955 Today's Date: 08/26/2018    History of Present Illness 63 yo male admitted after falling multiple times at home. Pt sustained bil sacral ala fractures, S2 compression fx, and L 1st MCP dislocation s/p reduction. Hx of HIV, DVT, chronic fatigue, lupus, multiple fractures from repeated falls.     PT Comments    Progressing with mobility. Pt continues to report significant pain in sacral area. Continue to recommend SNF.    Follow Up Recommendations  SNF(it pt is agreeable)     Equipment Recommendations  (continuing to assess. If discharges home, will need a RW possibly with L platform)    Recommendations for Other Services       Precautions / Restrictions Precautions Precautions: Fall Required Braces or Orthoses: Splint/Cast Splint/Cast: L hand/thumb Restrictions LLE Weight Bearing: Weight bearing as tolerated Other Position/Activity Restrictions: Unsure of WB status 2* thumb dislocation-using L PFRW to limit WBing on L hand currently. Pt is WBAT bil LEs    Mobility  Bed Mobility   Bed Mobility: Supine to Sit     Supine to sit: Mod assist     General bed mobility comments: oob in recliner  Transfers Overall transfer level: Needs assistance Equipment used: 2 person hand held assist Transfers: Sit to/from [employer] Transfers Sit to Stand: Min assist;+2 physical assistance;+2 safety/equipment        General transfer comment: Assist to rise, stabilize, control descent. VCs safety, technique, hand palcement.   Ambulation/Gait Ambulation/Gait assistance: Min assist;+2 safety/equipment Gait Distance (Feet): 70 Feet Assistive device: Left platform walker Gait Pattern/deviations: Step-to pattern     General Gait Details: Assist to stabilize pt throughout distance. Followed with recliner for safety. Slow gait speed. Cues for safety, sequence, technique.     Stairs             Wheelchair Mobility    Modified Rankin (Stroke Patients Only)       Balance Overall balance assessment: Needs assistance;History of Falls         Standing balance support: Bilateral upper extremity supported Standing balance-Leahy Scale: Poor                              Cognition Arousal/Alertness: Awake/alert Behavior During Therapy: WFL for tasks assessed/performed Overall Cognitive Status: Within Functional Limits for tasks assessed                                        Exercises      General Comments        Pertinent Vitals/Pain Pain Assessment: 0-10 Pain Score: 5  Pain Location: sacral area Pain Descriptors / Indicators: Grimacing;Aching;Sore Pain Intervention(s): Limited activity within patient's tolerance;Repositioned    Home Living Family/patient expects to be discharged to:: Private residence Living Arrangements: Non-relatives/Friends Available Help at Discharge: Available 24 hours/day Type of Home: Apartment Home Access: Level entry   Home Layout: One level Home Equipment: Cane - single point;Crutches;Shower seat - built in      Prior Function Level of Independence: Independent          PT Goals (current goals can now be found in the care plan section) Progress towards PT goals: Progressing toward goals    Frequency    Min 3X/week      PT Plan Current plan  remains appropriate    Co-evaluation              AM-PAC PT "6 Clicks" Mobility   Outcome Measure  Help needed turning from your back to your side while in a flat bed without using bedrails?: A Lot Help needed moving from lying on your back to sitting on the side of a flat bed without using bedrails?: A Lot Help needed moving to and from a bed to a chair (including a wheelchair)?: A Little Help needed standing up from a chair using your arms (e.g., wheelchair or bedside chair)?: A Lot Help needed to walk in  hospital room?: A Little Help needed climbing 3-5 steps with a railing? : A Lot 6 Click Score: 14    End of Session Equipment Utilized During Treatment: Gait belt Activity Tolerance: Patient limited by pain Patient left: in chair;with call bell/phone within reach;with chair alarm set   PT Visit Diagnosis: Pain;Difficulty in walking, not elsewhere classified (R26.2);Repeated falls (R29.6);History of falling (Z91.81);Other abnormalities of gait and mobility (R26.89) Pain - part of body: (sacral area)     Time: 6967-8938 PT Time Calculation (min) (ACUTE ONLY): 18 min  Charges:  $Gait Training: 8-22 mins                     Weston Anna, [place] Pager: [phone] Office: [phone]

## 2018-08-26 NOTE — Evaluation (Signed)
Occupational Therapy Evaluation Patient Details Name: Kenneth [name] MRN: [id number] DOB: 11/24/1955 Today's Date: 08/26/2018    History of Present Illness 63 yo male admitted after falling multiple times at home. Pt sustained bil sacral ala fractures, S2 compression fx, and L 1st MCP dislocation s/p reduction. Hx of HIV, DVT, chronic fatigue, lupus, multiple fractures from repeated falls.      Clinical Impression  Pt admitted with fall. Pt currently with functional limitations due to the deficits listed below (see OT Problem List).  Pt will benefit from skilled OT to increase their safety and independence with ADL and functional mobility for ADL to facilitate discharge to venue listed below.       Follow Up Recommendations  SNF    Equipment Recommendations  None recommended by OT    Recommendations for Other Services       Precautions / Restrictions Precautions Precautions: Fall Required Braces or Orthoses: Splint/Cast Splint/Cast: L hand/thumb Restrictions LLE Weight Bearing: Weight bearing as tolerated Other Position/Activity Restrictions: Unsure of WB status 2* thumb dislocation-using L PFRW to limit WBing on L hand currently. Pt is WBAT bil LEs      Mobility Bed Mobility   Bed Mobility: Supine to Sit     Supine to sit: Mod assist     General bed mobility comments: oob in recliner  Transfers Overall transfer level: Needs assistance Equipment used: 2 person hand held assist Transfers: Sit to/from [employer] Transfers Sit to Stand: Mod assist;+2 physical assistance;+2 safety/equipment Stand pivot transfers: Max assist;+2 safety/equipment;+2 physical assistance       General transfer comment: Assist to rise, stabilize, control descent. VCs safety, technique, hand palcement.     Balance Overall balance assessment: Needs assistance;History of Falls;Independent         Standing balance support: Bilateral upper extremity supported Standing  balance-Leahy Scale: Poor                             ADL either performed or assessed with clinical judgement   ADL Overall ADL's : Needs assistance/impaired Eating/Feeding: Set up;Sitting   Grooming: Set up;Sitting   Upper Body Bathing: Set up;Sitting   Lower Body Bathing: Maximal assistance;Sit to/from stand;Cueing for sequencing;Cueing for safety;+2 for safety/equipment;+2 for physical assistance   Upper Body Dressing : Set up;Sitting   Lower Body Dressing: Maximal assistance;Sit to/from stand;Cueing for sequencing;Cueing for safety;+2 for physical assistance;+2 for safety/equipment   Toilet Transfer: Maximal assistance;+2 for safety/equipment;Cueing for safety;Cueing for sequencing;BSC   Toileting- Clothing Manipulation and Hygiene: Total assistance;Cueing for sequencing;Cueing for safety;+2 for safety/equipment;+2 for physical assistance               Vision Patient Visual Report: No change from baseline              Pertinent Vitals/Pain Pain Assessment: 0-10 Pain Score: 5  Pain Location: sacral area Pain Descriptors / Indicators: Grimacing;Aching;Sore Pain Intervention(s): Limited activity within patient's tolerance;Repositioned     Hand Dominance     Extremity/Trunk Assessment Upper Extremity Assessment LUE Deficits / Details: L hand/thumb in splint       Cervical / Trunk Assessment Cervical / Trunk Assessment: Kyphotic   Communication Communication Communication: No difficulties   Cognition Arousal/Alertness: Awake/alert Behavior During Therapy: WFL for tasks assessed/performed Overall Cognitive Status: Within Functional Limits for tasks assessed  Home Living Family/patient expects to be discharged to:: Private residence Living Arrangements: Non-relatives/Friends Available Help at Discharge: Available 24 hours/day Type of Home: Apartment Home Access: Level entry      Home Layout: One level     Bathroom Shower/Tub: [place] - single point;Crutches;Shower seat - built in          Prior Functioning/Environment Level of Independence: Independent                 OT Problem List: Decreased strength;Decreased activity tolerance;Impaired balance (sitting and/or standing);Decreased knowledge of precautions;Decreased knowledge of use of DME or AE;Pain      OT Treatment/Interventions: Self-care/ADL training;Patient/family education;Therapeutic activities;DME and/or AE instruction    OT Goals(Current goals can be found in the care plan section) Acute Rehab OT Goals Patient Stated Goal: less pain. regain PLOF.  OT Goal Formulation: With patient Time For Goal Achievement: 09/18/18 Potential to Achieve Goals: Good  OT Frequency: Min 2X/week   Barriers to D/C: Decreased caregiver support             AM-PAC OT "6 Clicks" Daily Activity     Outcome Measure Help from another person eating meals?: None Help from another person taking care of personal grooming?: None Help from another person toileting, which includes using toliet, bedpan, or urinal?: A Lot Help from another person bathing (including washing, rinsing, drying)?: A Lot Help from another person to put on and taking off regular upper body clothing?: A Little Help from another person to put on and taking off regular lower body clothing?: A Lot 6 Click Score: 17   End of Session    Activity Tolerance: Patient limited by pain Patient left: in chair;with call bell/phone within reach;with nursing/sitter in room  OT Visit Diagnosis: Unsteadiness on feet (R26.81);Other abnormalities of gait and mobility (R26.89);History of falling (Z91.81);Muscle weakness (generalized) (M62.81);Pain                Time: 1125-1140 OT Time Calculation (min): 15 min Charges:  OT General Charges $OT Visit: 1 Visit OT Evaluation $OT Eval Moderate Complexity: 1  Mod    Mireille Lacombe, Thereasa Parkin 08/26/2018, 1:52 PM

## 2018-08-26 NOTE — Progress Notes (Signed)
Initial Nutrition Assessment  RD working remotely.   DOCUMENTATION CODES:   Underweight  INTERVENTION:  - will order daily multivitamin with minerals. - continue to encourage PO intakes.  - will monitor for additional nutrition-related needs.    NUTRITION DIAGNOSIS:   Increased nutrient needs related to acute illness as evidenced by estimated needs.  GOAL:   Patient will meet greater than or equal to 90% of their needs  MONITOR:   PO intake, Labs, Weight trends  REASON FOR ASSESSMENT:   Other (Comment)(underweight BMI)  ASSESSMENT:   63 y.o. male with medical history significant of HIV, malnutrition, ESRD previously on dialysis now s/p renal transplant in 04/2018, lupus with nephritis. Patient presented to the ED following several falls in the past week and another fall the night of presentation in which he injured his pelvis and hip. CT showed pelvic fx and it was also noted that patient had L thumb fx. Patient denies any N/V/D PTA. He was unable to ambulate d/t pain from fracture.    Per RN flow sheet, patient consumed 25% of breakfast and lunch and 50% of dinner on 5/24. Spoke with patient via phone. He reports that since renal transplant things have been going fairly well. He lost his appetite for ~1 month but it is returning. He states that prior to losing his appetite he weighed 134 lb and that he is now down to 128 lb. Per chart review, weight on 1/9 was 132 lb.  He also some difficulties with his esophagus at times and that he has an appointment related to this on 6/4. He has had his esophagus stretched x2 in the past and reports this was ~2 years ago. He sometimes experiences burning sensation with acidic items and carbonated items. He also feels that items sometimes get lodged in his esophagus and that he has to bring them back up.   He has drank oral nutrition supplements including Ensure, Boost, and Premier Protein in the past. At this time patient prefers to focus on  meals and to not have oral nutrition supplements ordered.   He denied any nutrition-related questions or concerns but is aware that he can alert MD or RN if he does and that RD can then be contacted. Suspect some degree of malnutrition but unable to confirm at this time.    Medications reviewed; 10 mg deltasone/day. Labs reviewed; Ca: 8.6 mg/dl.      NUTRITION - FOCUSED PHYSICAL EXAM:  unable to perform at this time.  Diet Order:   Diet Order            Diet Heart Room service appropriate? Yes; Fluid consistency: Thin  Diet effective now              EDUCATION NEEDS:   No education needs have been identified at this time  Skin:  Skin Assessment: Reviewed RN Assessment  Last BM:  5/22 (PTA)  Height:   Ht Readings from Last 1 Encounters:  08/24/18 5\' 11"  (1.803 m)    Weight:   Wt Readings from Last 1 Encounters:  08/24/18 58.1 kg    Ideal Body Weight:  78.2 kg  BMI:  Body mass index is 17.85 kg/m.  Estimated Nutritional Needs:   Kcal:  1750-2030 kcal  Protein:  75-87 grams  Fluid:  >/= 1.7 L/day     Jarome Matin, MS, RD, LDN, [facility] In[facility] Clinical Dietitian Pager # [phone] After hours/weekend pager # [phone]

## 2018-08-26 NOTE — NC FL2 (Signed)
[place] LEVEL OF CARE SCREENING TOOL     IDENTIFICATION  Patient Name: Kenneth [name] Birthdate: [birth year] Sex: male Admission Date (Current Location): 08/24/2018  [facility] and [place] Number:  Herbalist and Address:  [facility],  [place], [place]      Provider Number: [id number]  Attending Physician Name and Address:  Dessa Phi, DO  Relative Name and Phone Number:  n/a    Current Level of Care: Hospital Recommended Level of Care: [place] Prior Approval Number:    Date Approved/Denied:   PASRR Number: [id number]  Discharge Plan: SNF    Current Diagnoses: Patient Active Problem List   Diagnosis Date Noted  . Pelvic fracture ([place]) 08/25/2018  . Multiple falls 08/25/2018  . Thumb fracture 08/25/2018  . Closed nondisplaced zone I fracture of sacrum ([place]) 08/22/2018  . Tachycardia 08/22/2018  . Suprapubic pain 04/11/2018  . Osteoporosis 03/15/2017  . Health care maintenance 06/30/2016  . Chronic fatigue 03/11/2015  . Exacerbation of systemic lupus ([place]) 02/24/2015  . Dysphagia   . ESRD (end stage renal disease) ([place])   . DVT (deep venous thrombosis) ([place]) 06/22/2014  . PVC's (premature ventricular contractions) 03/17/2014  . Abdominal pain   . Protein-calorie malnutrition, severe ([place]) 02/11/2014  . Lupus nephritis ([place])   . Arthralgia of multiple sites, bilateral 02/02/2014  . Bilateral low back pain without sciatica 02/01/2014  . EXTERNAL HEMORRHOIDS WITHOUT MENTION COMP 01/20/2009  . HIV disease ([place]) 07/24/2006  . Hyperlipidemia 05/31/2006  . ERECTILE DYSFUNCTION 05/31/2006  . INSOMNIA NOS 05/31/2006    Orientation RESPIRATION BLADDER Height & Weight     Self, Time, Situation, Place  Normal Continent Weight: 58.1 kg Height:  5\' 11"  (180.3 cm)  BEHAVIORAL SYMPTOMS/MOOD NEUROLOGICAL BOWEL NUTRITION STATUS      Continent Diet(Cardiac)  AMBULATORY STATUS COMMUNICATION OF  NEEDS Skin   Limited Assist Verbally Other (Comment)(L upper arm  av fistula; L thumb soft splint)                       Personal Care Assistance Level of Assistance  Bathing, Feeding, Dressing Bathing Assistance: Limited assistance Feeding assistance: Limited assistance Dressing Assistance: Limited assistance     Functional Limitations Info  Sight Sight Info: Impaired(eyeglasses)        SPECIAL CARE FACTORS FREQUENCY  PT (By licensed PT), OT (By licensed OT)     PT Frequency: 5x week OT Frequency: 5x week            Contractures Contractures Info: Not present    Additional Factors Info  Code Status Code Status Info: Full code             Current Medications (08/26/2018):  This is the current hospital active medication list Current Facility-Administered Medications  Medication Dose Route Frequency Provider Last Rate Last Dose  . 0.9 %  sodium chloride infusion  250 mL Intravenous PRN Phillips Grout, MD      . abacavir-dolutegravir-lamiVUDine (TRIUMEQ) 600-50-300 MG per tablet 1 tablet  1 tablet Oral Daily Phillips Grout, MD   1 tablet at 08/26/18 1002  . amitriptyline (ELAVIL) tablet 10 mg  10 mg Oral QHS Dessa Phi, DO   10 mg at 08/25/18 2112  . aspirin EC tablet 81 mg  81 mg Oral Daily Dessa Phi, DO   81 mg at 08/26/18 1001  . cyclobenzaprine (FLEXERIL) tablet 5 mg  5 mg Oral TID PRN  Phillips Grout, MD   5 mg at 08/25/18 1115  . enoxaparin (LOVENOX) injection 40 mg  40 mg Subcutaneous Q24H Dessa Phi, DO   40 mg at 08/26/18 1127  . HYDROcodone-acetaminophen (NORCO) 7.5-325 MG per tablet 2 tablet  2 tablet Oral Q4H PRN Dessa Phi, DO   2 tablet at 08/26/18 1001  . HYDROmorphone (DILAUDID) injection 1 mg  1 mg Intravenous Q4H PRN Phillips Grout, MD   1 mg at 08/25/18 1341  . hydroxychloroquine (PLAQUENIL) tablet 200 mg  200 mg Oral BID Derrill Kay A, MD   200 mg at 08/26/18 1002  . multivitamin with minerals tablet 1 tablet  1 tablet Oral  Daily Dessa Phi, DO   1 tablet at 08/26/18 1127  . mycophenolate (CELLCEPT) capsule 250 mg  250 mg Oral Q12H Dessa Phi, DO   250 mg at 08/26/18 1002  . ondansetron (ZOFRAN) tablet 4 mg  4 mg Oral Q6H PRN Phillips Grout, MD       Or  . ondansetron [facility]) injection 4 mg  4 mg Intravenous Q6H PRN Phillips Grout, MD      . predniSONE (DELTASONE) tablet 10 mg  10 mg Oral Q breakfast Dessa Phi, DO   10 mg at 08/26/18 8127  . sodium chloride flush (NS) 0.9 % injection 3 mL  3 mL Intravenous Q12H Derrill Kay A, MD   3 mL at 08/26/18 1005  . sodium chloride flush (NS) 0.9 % injection 3 mL  3 mL Intravenous PRN Derrill Kay A, MD      . tacrolimus (PROGRAF) capsule 1.5 mg  1.5 mg Oral BID Dessa Phi, DO   1.5 mg at 08/26/18 1002  . tamsulosin (FLOMAX) capsule 0.4 mg  0.4 mg Oral Daily Dessa Phi, DO   0.4 mg at 08/26/18 1001     Discharge Medications: Please see discharge summary for a list of discharge medications.  Relevant Imaging Results:  Relevant Lab Results:   Additional Information ([id number])  Lorelei Heikkila, Juliann Pulse, RN

## 2018-08-26 NOTE — TOC Initial Note (Signed)
Transition of Care [facility]) - Initial/Assessment Note    Patient Details  Name: Kenneth [name] MRN: [id number] Date of Birth: 07-31-1955  [facility]) CM/SW Contact:    Dessa Phi, RN Phone Number: 08/26/2018, 12:10 PM  Clinical Narrative:Faxed out to SNF-awaiting bed offers.                   Expected Discharge Plan: Skilled Nursing Facility Barriers to Discharge: SNF Pending bed offer   Patient Goals and CMS Choice Patient states their goals for this hospitalization and ongoing recovery are:: To get stronger CMS Medicare.gov Compare Post Acute Care list provided to:: Patient Choice offered to / list presented to : Patient  Expected Discharge Plan and Services Expected Discharge Plan: [place] In-house Referral: Clinical Social Work Discharge Planning Services: CM Consult Post Acute Care Choice: [place] Living arrangements for the past 2 months: [place]                                      Prior Living Arrangements/Services Living arrangements for the past 2 months: Single Family Home Lives with:: Roommate Patient language and need for interpreter reviewed:: Yes Do you feel safe going back to the place where you live?: No      Need for Family Participation in Patient Care: No (Comment) Care giver support system in place?: Yes (comment) Current home services: Sitter(Patient reports having a sitter (friend) that calls and checks on him once (free of charge)) Criminal Activity/Legal Involvement Pertinent to Current Situation/Hospitalization: No - Comment as needed  Activities of Daily Living Home Assistive Devices/Equipment: Crutches, Eyeglasses ADL Screening (condition at time of admission) Patient's cognitive ability adequate to safely complete daily activities?: Yes Is the patient deaf or have difficulty hearing?: No Does the patient have difficulty seeing, even when wearing glasses/contacts?: No Does  the patient have difficulty concentrating, remembering, or making decisions?: No Patient able to express need for assistance with ADLs?: Yes Does the patient have difficulty dressing or bathing?: Yes Independently performs ADLs?: No Communication: Independent Dressing (OT): Needs assistance Is this a change from baseline?: Pre-admission baseline Grooming: Needs assistance Is this a change from baseline?: Pre-admission baseline Feeding: Independent Bathing: Needs assistance Is this a change from baseline?: Pre-admission baseline Toileting: Needs assistance Is this a change from baseline?: Pre-admission baseline In/Out Bed: Needs assistance Is this a change from baseline?: Pre-admission baseline Walks in Home: Needs assistance Is this a change from baseline?: Pre-admission baseline Does the patient have difficulty walking or climbing stairs?: Yes Weakness of Legs: Both Weakness of Arms/Hands: None  Permission Sought/Granted Permission sought to share information with : Case Manager Permission granted to share information with : Yes, Verbal Permission Granted     Permission granted to share info w AGENCY: SNF        Emotional Assessment Appearance:: Appears stated age   Affect (typically observed): Accepting Orientation: : Oriented to Self, Oriented to Place, Oriented to  Time, Oriented to Situation Alcohol / Substance Use: Never Used Psych Involvement: No (comment)  Admission diagnosis:  Fall, initial encounter [W19.XXXA] Chin laceration, initial encounter [S01.81XA] Closed dislocation of metacarpophalangeal joint of left thumb, initial encounter [S63.115A] Closed fracture of sacrum, unspecified portion of sacrum, initial encounter ([place]) [S32.10XA] Patient Active Problem List   Diagnosis Date Noted  . Pelvic fracture ([place]) 08/25/2018  . Multiple falls 08/25/2018  . Thumb fracture 08/25/2018  .  Closed nondisplaced zone I fracture of sacrum ([place]) 08/22/2018  . Tachycardia  08/22/2018  . Suprapubic pain 04/11/2018  . Osteoporosis 03/15/2017  . Health care maintenance 06/30/2016  . Chronic fatigue 03/11/2015  . Exacerbation of systemic lupus ([place]) 02/24/2015  . Dysphagia   . ESRD (end stage renal disease) ([place])   . DVT (deep venous thrombosis) ([place]) 06/22/2014  . PVC's (premature ventricular contractions) 03/17/2014  . Abdominal pain   . Protein-calorie malnutrition, severe ([place]) 02/11/2014  . Lupus nephritis ([place])   . Arthralgia of multiple sites, bilateral 02/02/2014  . Bilateral low back pain without sciatica 02/01/2014  . EXTERNAL HEMORRHOIDS WITHOUT MENTION COMP 01/20/2009  . HIV disease ([place]) 07/24/2006  . Hyperlipidemia 05/31/2006  . ERECTILE DYSFUNCTION 05/31/2006  . INSOMNIA NOS 05/31/2006   PCP:  [name], MD Pharmacy:   [facility], [place] [address] Phone: [phone] Fax: [phone]  [id number] [place], [place] [address] [place] [address] Phone: [phone] Fax: [phone]     Social Determinants of Health (SDOH) Interventions    Readmission Risk Interventions No flowsheet data found.

## 2018-08-27 MED ORDER — CYCLOBENZAPRINE HCL 5 MG PO TABS: 5.0000 mg | ORAL_TABLET | Freq: Three times a day (TID) | ORAL | 0 refills | Status: DC | PRN

## 2018-08-27 MED ORDER — HYDROCODONE-ACETAMINOPHEN 7.5-325 MG PO TABS: 1.0000 | ORAL_TABLET | ORAL | 0 refills | Status: DC | PRN

## 2018-08-27 NOTE — Discharge Summary (Signed)
Physician Discharge Summary  WHEELER INCORVAIA BUL:[id number] DOB: [birth year] DOA: 08/24/2018  PCP: [name], MD  Admit date: 08/24/2018 Discharge date: 08/27/2018  Admitted From: Home Disposition:  SNF   Recommendations for Outpatient Follow-up:  1. Follow up with PCP in 1 week 2. Follow-up with orthopedic surgery Dr. Doreatha Martin as an outpatient in 3 weeks  3. Follow-up with hand surgery Dr. Lenon Curt for left thumb dislocation status post reduction as an outpatient in 3 days  4. Asymmetric bladder wall thickening. Will need outpatient urology follow-up. Incidental finding on CT pelvis  Discharge Condition: Stable CODE STATUS: Full  Diet recommendation: Heart healthy   Brief/Interim Summary: Kenneth Hornig Parkeris a56 year old male with past medical history significant for HIVon Triumeq, ESRD previously on dialysis status post renal transplant in January, lupus nephritison plaquenil,who presents with falls at home.CT revealed acute minimally displaced fracture involving bilateral sacral alla, new compression fracture S2 vertebral body, diffuse osteopenia orthopedic surgery consulted.  Orthopedic surgery recommended weight-bear as tolerated, physical therapy evaluation.   Bilateral sacral alla fracture, minimal displacement  Compression fracture S2 vertebral body Left thumb dislocation status post reduction -Follow-up with orthopedic surgery as an outpatient in 2 weeks  -Follow-up with hand surgery for left thumb dislocation status post reduction as an outpatient -Pain control -WBAT BLE  -PT recommending SNF placement  HIV -Continue Triumeq   Lupus nephritis ESRD previously on dialysis status post renal transplant -Continue plaquenil, cellcept, prednisone, prograf   Asymmetric bladder wall thickening  -Will need outpatient urology follow-up. Incidental finding on CT pelvis    Discharge Instructions  Discharge Instructions    Diet - low sodium heart healthy   Complete  by:  As directed    Increase activity slowly   Complete by:  As directed      Allergies as of 08/27/2018   No Known Allergies     Medication List    STOP taking these medications   acetaminophen 500 MG tablet Commonly known as:  Tylenol   oxyCODONE 5 MG immediate release tablet Commonly known as:  Roxicodone     TAKE these medications   abacavir-dolutegravir-lamiVUDine 600-50-300 MG tablet Commonly known as:  TRIUMEQ Take 1 tablet by mouth daily.   amitriptyline 10 MG tablet Commonly known as:  ELAVIL Take 1 tablet (10 mg total) by mouth at bedtime.   aspirin EC 81 MG tablet Take 81 mg by mouth daily.   calcitRIOL 0.25 MCG capsule Commonly known as:  ROCALTROL Take 3 capsules (0.75 mcg total) by mouth every Monday, Wednesday, and Friday with hemodialysis.   Cholecalciferol 50 MCG (2000 UT) Tabs Take 2,000 Units by mouth daily.   cyclobenzaprine 5 MG tablet Commonly known as:  FLEXERIL Take 1 tablet (5 mg total) by mouth 3 (three) times daily as needed for muscle spasms.   HYDROcodone-acetaminophen 7.5-325 MG tablet Commonly known as:  Norco Take 1-2 tablets by mouth every 4 (four) hours as needed for moderate pain or severe pain. What changed:    how much to take  reasons to take this   hydroxychloroquine 200 MG tablet Commonly known as:  PLAQUENIL Take 1 tablet (200 mg total) by mouth 2 (two) times daily.   mycophenolate 250 MG capsule Commonly known as:  CELLCEPT Take 250 mg by mouth every 12 (twelve) hours.   predniSONE 10 MG tablet Commonly known as:  DELTASONE Take 1 tablet (10 mg total) by mouth daily.   tacrolimus 0.5 MG capsule Commonly known as:  PROGRAF Take 1.5 mg  by mouth 2 (two) times daily.   tamsulosin 0.4 MG Caps capsule Commonly known as:  FLOMAX Take 0.04 mg by mouth daily.      Follow-up Information    Dayna Barker, MD. Call in 3 days.   Specialty:  General Surgery Contact information: [address] [place] [address] [phone]        [place]. Schedule an appointment as soon as possible for a visit in 1 week.   Specialty:  Family Medicine Contact information: [address] [id number] [place] [phone]       Shona Needles, MD Follow up in 3 week(s).   Specialty:  Orthopedic Surgery Why:  repeat x-rays Contact information: [place] [address] [phone]          No Known Allergies  Consultations:  Orthopedic surgery    Procedures/Studies: Ct Pelvis Wo Contrast  Result Date: 08/25/2018 CLINICAL DATA:  Left posterior hip pain status post trauma EXAM: CT PELVIS WITHOUT CONTRAST TECHNIQUE: Multidetector CT imaging of the pelvis was performed following the standard protocol without intravenous contrast. COMPARISON:  08/17/2018 FINDINGS: Urinary Tract: There is mild asymmetric bladder wall thickening. 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 Bowel: There is a large amount of stool in the colon. The appendix is not reliably identified. Vascular/Lymphatic: No pathologically enlarged lymph nodes. No significant vascular abnormality seen. Reproductive:  The prostate gland is significantly enlarged. Other:  None. Musculoskeletal: There are new acute minimally displaced bilateral sacral ala fractures. There is a compression fracture of the S2 vertebral body. There is diffuse osteopenia. IMPRESSION: 1. New acute minimally displaced fractures involving the bilateral sacral ala. 2. New compression fracture of the S2 vertebral body. 3. Diffuse osteopenia. 4. Slightly asymmetric bladder wall thickening which is nonspecific. Correlation with urinalysis is recommended. Outpatient urology follow-up is recommended. There is diffuse prostatomegaly. Electronically Signed   By: Constance Holster M.D.   On: 08/25/2018 01:41   Ct Hip Left Wo Contrast  Result  Date: 08/17/2018 CLINICAL DATA:  Left hip pain after fall. EXAM: CT OF THE LEFT HIP WITHOUT CONTRAST TECHNIQUE: Multidetector CT imaging of the left hip was performed according to the standard protocol. Multiplanar CT image reconstructions were also generated. COMPARISON:  Left hip x-rays from same day. CT abdomen pelvis dated June 22, 2014. FINDINGS: Bones/Joint/Cartilage There is a tiny nondisplaced fracture of the anterior left sacral ala (series 3, image 19). No additional fracture. No dislocation. The left hip joint space is preserved. No joint effusion. Ligaments Suboptimally assessed by CT. Muscles and Tendons Grossly intact. Soft tissues No soft tissue mass or fluid collection. IMPRESSION: 1. Tiny nondisplaced fracture of the anterior left sacral ala. 2. No femur fracture. Electronically Signed   By: Titus Dubin M.D.   On: 08/17/2018 17:07   Dg Hand Complete Left  Result Date: 08/24/2018 CLINICAL DATA:  Recent trip and fall with thumb deformity, initial encounter EXAM: LEFT HAND - COMPLETE 3+ VIEW COMPARISON:  None. FINDINGS: There is dislocation at the first MCP joint with lateral displacement of the proximal phalanx with respect to the metacarpal. No acute fracture is seen. No soft tissue abnormality is noted. IMPRESSION: Dislocation at the first MCP joint. Electronically Signed   By: [name] M.D.   On: 08/24/2018 22:16   Dg Finger Thumb Left  Result Date: 08/24/2018 CLINICAL DATA:  Post reduction radiographs EXAM: LEFT THUMB 2+V COMPARISON:  08/24/2018 FINDINGS: The patient is status post reduction at the first metacarpophalangeal joint. The alignment appears significantly improved. There may be some mild palmar subluxation. There is no evidence of a displaced fracture. There is surrounding soft tissue swelling. IMPRESSION: Improved alignment status post reduction. No evidence of a displaced fracture. Electronically Signed   By: Constance Holster M.D.   On: 08/24/2018 23:28   Dg Hip  Unilat W Or Wo Pelvis 2-3 Views Left  Result Date: 08/24/2018 CLINICAL DATA:  Recent fall with left hip pain, initial encounter EXAM: DG HIP (WITH OR WITHOUT PELVIS) 3V LEFT COMPARISON:  08/17/2018 FINDINGS: There is no evidence of hip fracture or dislocation. There is no evidence of arthropathy or other focal bone abnormality. IMPRESSION: No acute abnormality noted. Electronically Signed   By: [name] M.D.   On: 08/24/2018 22:17   Dg Hip Unilat With Pelvis 2-3 Views Left  Result Date: 08/17/2018 CLINICAL DATA:  Fall and posterior left hip pain. EXAM: DG HIP (WITH OR WITHOUT PELVIS) 2-3V LEFT COMPARISON:  CT of the pelvis 06/22/2014 FINDINGS: Pelvic bony structures are intact. Surgical clips in the right hemipelvic region. No gross abnormality to the right hip joint. Left hip is located without a fracture. IMPRESSION: No acute bone abnormality to the pelvis or left hip. Electronically Signed   By: Markus Daft M.D.   On: 08/17/2018 13:56       Discharge Exam: Vitals:   08/26/18 2040 08/27/18 0607  BP: 130/82 131/89  Pulse: 91 87  Resp: 20 20  Temp: 98.4 F (36.9 C) 98.1 F (36.7 C)  SpO2: 100% 100%     General: Pt is alert, awake, not in acute distress Cardiovascular: RRR, S1/S2 +, no rubs, no gallops Respiratory: CTA bilaterally, no wheezing, no rhonchi Abdominal: Soft, NT, ND, bowel sounds + Extremities: no edema, no cyanosis    The results of significant diagnostics from this hospitalization (including imaging, microbiology, ancillary and laboratory) are listed below for reference.     Microbiology: Recent Results (from the past 240 hour(s))  SARS Coronavirus 2 (CEPHEID - Performed in [facility] hospital lab), Hosp Order     Status: None   Collection Time: 08/25/18  4:27 AM  Result Value Ref Range Status   SARS Coronavirus 2 NEGATIVE NEGATIVE Final    Comment: (NOTE) If result is NEGATIVE SARS-CoV-2 target nucleic acids are NOT DETECTED. The SARS-CoV-2 RNA is  generally detectable in upper and lower  respiratory specimens during the acute phase of infection. The lowest  concentration of SARS-CoV-2 viral copies this assay can detect is 250  copies / mL. A negative result does not preclude SARS-CoV-2 infection  and should not be used as the sole basis for treatment or other  patient management decisions.  A negative result may occur with  improper specimen collection / handling, submission of specimen other  than nasopharyngeal swab, presence of viral mutation(s) within the  areas targeted by this assay, and inadequate number of viral copies  (<250 copies / mL). A negative result must be combined with clinical  observations, patient history, and epidemiological information. If result is POSITIVE SARS-CoV-2 target nucleic acids are DETECTED. The SARS-CoV-2 RNA is generally detectable in upper and lower  respiratory specimens dur ing the acute phase of infection.  Positive  results are indicative of active infection with SARS-CoV-2.  Clinical  correlation with patient history and other diagnostic information is  necessary to determine patient infection status.  Positive results do  not rule  out bacterial infection or co-infection with other viruses. If result is PRESUMPTIVE POSTIVE SARS-CoV-2 nucleic acids MAY BE PRESENT.   A presumptive positive result was obtained on the submitted specimen  and confirmed on repeat testing.  While 2019 novel coronavirus  (SARS-CoV-2) nucleic acids may be present in the submitted sample  additional confirmatory testing may be necessary for epidemiological  and / or clinical management purposes  to differentiate between  SARS-CoV-2 and other Sarbecovirus currently known to infect humans.  If clinically indicated additional testing with an alternate test  methodology [phone]) is advised. The SARS-CoV-2 RNA is generally  detectable in upper and lower respiratory sp ecimens during the acute  phase of  infection. The expected result is Negative. Fact Sheet for Patients:  StrictlyIdeas.no Fact Sheet for Healthcare Providers: BankingDealers.co.za This test is not yet approved or cleared by the [place] FDA and has been authorized for detection and/or diagnosis of SARS-CoV-2 by FDA under an Emergency Use Authorization (EUA).  This EUA will remain in effect (meaning this test can be used) for the duration of the COVID-19 declaration under Section 564(b)(1) of the Act, 21 U.S.C. section 360bbb-3(b)(1), unless the authorization is terminated or revoked sooner. Performed at [facility], [place] [address] [place], [place] [address]      Labs: BNP (last 3 results) No results for input(s): BNP in the last 8760 hours. Basic Metabolic Panel: Recent Labs  Lab 08/25/18 0208  NA 139  K 3.8  CL 108  CO2 24  GLUCOSE 98  BUN 16  CREATININE 1.21  CALCIUM 8.6*   Liver Function Tests: No results for input(s): AST, ALT, ALKPHOS, BILITOT, PROT, ALBUMIN in the last 168 hours. No results for input(s): LIPASE, AMYLASE in the last 168 hours. No results for input(s): AMMONIA in the last 168 hours. CBC: Recent Labs  Lab 08/25/18 0208  WBC 4.8  NEUTROABS 3.0  HGB 14.1  HCT 46.1  MCV 104.3*  PLT 185   Cardiac Enzymes: No results for input(s): CKTOTAL, CKMB, CKMBINDEX, TROPONINI in the last 168 hours. BNP: Invalid input(s): POCBNP CBG: No results for input(s): GLUCAP in the last 168 hours. D-Dimer No results for input(s): DDIMER in the last 72 hours. Hgb A1c No results for input(s): HGBA1C in the last 72 hours. Lipid Profile No results for input(s): CHOL, HDL, LDLCALC, TRIG, CHOLHDL, LDLDIRECT in the last 72 hours. Thyroid function studies No results for input(s): TSH, T4TOTAL, T3FREE, THYROIDAB in the last 72 hours.  Invalid input(s): FREET3 Anemia work up No results for input(s): VITAMINB12, FOLATE, FERRITIN,  TIBC, IRON, RETICCTPCT in the last 72 hours. Urinalysis    Component Value Date/Time   COLORURINE YELLOW 08/25/2018 0208   APPEARANCEUR HAZY (A) 08/25/2018 0208   LABSPEC 1.025 08/25/2018 0208   PHURINE 5.0 08/25/2018 0208   GLUCOSEU NEGATIVE 08/25/2018 0208   GLUCOSEU NEG mg/dL 08/16/2006 2308   HGBUR NEGATIVE 08/25/2018 0208   BILIRUBINUR NEGATIVE 08/25/2018 0208   BILIRUBINUR neg 03/05/2014 1214   KETONESUR NEGATIVE 08/25/2018 0208   PROTEINUR NEGATIVE 08/25/2018 0208   UROBILINOGEN 0.2 07/04/2014 0022   NITRITE NEGATIVE 08/25/2018 0208   LEUKOCYTESUR NEGATIVE 08/25/2018 0208   Sepsis Labs Invalid input(s): PROCALCITONIN,  WBC,  LACTICIDVEN Microbiology Recent Results (from the past 240 hour(s))  SARS Coronavirus 2 (CEPHEID - Performed in [facility] hospital lab), Hosp Order     Status: None   Collection Time: 08/25/18  4:27 AM  Result Value Ref Range Status   SARS Coronavirus 2  NEGATIVE NEGATIVE Final    Comment: (NOTE) If result is NEGATIVE SARS-CoV-2 target nucleic acids are NOT DETECTED. The SARS-CoV-2 RNA is generally detectable in upper and lower  respiratory specimens during the acute phase of infection. The lowest  concentration of SARS-CoV-2 viral copies this assay can detect is 250  copies / mL. A negative result does not preclude SARS-CoV-2 infection  and should not be used as the sole basis for treatment or other  patient management decisions.  A negative result may occur with  improper specimen collection / handling, submission of specimen other  than nasopharyngeal swab, presence of viral mutation(s) within the  areas targeted by this assay, and inadequate number of viral copies  (<250 copies / mL). A negative result must be combined with clinical  observations, patient history, and epidemiological information. If result is POSITIVE SARS-CoV-2 target nucleic acids are DETECTED. The SARS-CoV-2 RNA is generally detectable in upper and lower  respiratory  specimens dur ing the acute phase of infection.  Positive  results are indicative of active infection with SARS-CoV-2.  Clinical  correlation with patient history and other diagnostic information is  necessary to determine patient infection status.  Positive results do  not rule out bacterial infection or co-infection with other viruses. If result is PRESUMPTIVE POSTIVE SARS-CoV-2 nucleic acids MAY BE PRESENT.   A presumptive positive result was obtained on the submitted specimen  and confirmed on repeat testing.  While 2019 novel coronavirus  (SARS-CoV-2) nucleic acids may be present in the submitted sample  additional confirmatory testing may be necessary for epidemiological  and / or clinical management purposes  to differentiate between  SARS-CoV-2 and other Sarbecovirus currently known to infect humans.  If clinically indicated additional testing with an alternate test  methodology [phone] is advised. The SARS-CoV-2 RNA is generally  detectable in upper and lower respiratory sp ecimens during the acute  phase of infection. The expected result is Negative. Fact Sheet for Patients:  StrictlyIdeas.no Fact Sheet for Healthcare Providers: BankingDealers.co.za This test is not yet approved or cleared by the [place] FDA and has been authorized for detection and/or diagnosis of SARS-CoV-2 by FDA under an Emergency Use Authorization (EUA).  This EUA will remain in effect (meaning this test can be used) for the duration of the COVID-19 declaration under Section 564(b)(1) of the Act, 21 U.S.C. section 360bbb-3(b)(1), unless the authorization is terminated or revoked sooner. Performed at [facility], [place] [address] [place], [place] [address]      Patient was seen and examined on the day of discharge and was found to be in stable condition. Time coordinating discharge: 35 minutes including assessment and  coordination of care, as well as examination of the patient.   SIGNED:  Dessa Phi, DO [facility] [email or web address] 08/27/2018, 10:07 AM

## 2018-08-27 NOTE — TOC Transition Note (Signed)
Transition of Care [facility]) - CM/SW Discharge Note   Patient Details  Name: Kenneth [name] MRN: [id number] Date of Birth: 1956-01-04  Transition of Care [facility]) CM/SW Contact:  Dessa Phi, RN Phone Number: 08/27/2018, 11:15 AM   Clinical Narrative:  Patient chose [facility] aware of d/c-faxed d/c summary w/confirmation-fax#[phone]. [facility] called for 1p pick up. Patient to provide own HIV meds-will have them brought to [facility]. Patient is not on dialysis currently-[facility] rep aware. Nurse given tel# to call for report-[phone].    Final next level of care: [place] Barriers to Discharge: No Barriers Identified   Patient Goals and CMS Choice Patient states their goals for this hospitalization and ongoing recovery are:: To get stronger CMS Medicare.gov Compare Post Acute Care list provided to:: Patient Choice offered to / list presented to : Patient  Discharge Placement PASRR number recieved: 08/26/18            Patient chooses bed at: [facility]) Patient to be transferred to facility by: [facility]y [facility]) Name of family member notified: (None per patient request) Patient and family notified of of transfer: 08/27/18  Discharge Plan and Services In-house Referral: Clinical Social Work Discharge Planning Services: CM Consult Post Acute Care Choice: [place]                               Social Determinants of Health (SDOH) Interventions     Readmission Risk Interventions No flowsheet data found.

## 2018-08-28 ENCOUNTER — Encounter: Payer: Self-pay | Admitting: Family Medicine

## 2018-08-28 ENCOUNTER — Other Ambulatory Visit: Payer: Self-pay | Admitting: Internal Medicine

## 2018-08-28 ENCOUNTER — Encounter: Payer: Self-pay | Admitting: Internal Medicine

## 2018-08-28 ENCOUNTER — Non-Acute Institutional Stay (SKILLED_NURSING_FACILITY): Payer: Medicare Other | Admitting: Internal Medicine

## 2018-08-28 DIAGNOSIS — R296 Repeated falls: Secondary | ICD-10-CM

## 2018-08-28 DIAGNOSIS — N186 End stage renal disease: Secondary | ICD-10-CM

## 2018-08-28 DIAGNOSIS — S32110D Nondisplaced Zone I fracture of sacrum, subsequent encounter for fracture with routine healing: Secondary | ICD-10-CM | POA: Diagnosis not present

## 2018-08-28 MED ORDER — HYDROCODONE-ACETAMINOPHEN 7.5-325 MG PO TABS: 1.0000 | ORAL_TABLET | Freq: Four times a day (QID) | ORAL | 0 refills | Status: DC | PRN

## 2018-08-28 NOTE — Assessment & Plan Note (Signed)
Weightbearing as tolerated; PT/OT at SNF Follow-up with Dr. Doreatha Martin in 3 weeks

## 2018-08-28 NOTE — Patient Instructions (Signed)
See assessment and plan under each diagnosis in the problem list and acutely for this visit 

## 2018-08-28 NOTE — Assessment & Plan Note (Addendum)
Assess high risk medications which may increase fall recurrence Discontinue Flexeril

## 2018-08-28 NOTE — Progress Notes (Signed)
NURSING HOME LOCATION:  [facility] ROOM NUMBER:  [id number]  CODE STATUS: Full Code   PCP:  Kenneth Naegeli MD  This is a comprehensive admission note to [facility] performed on this date less than 30 days from date of admission. Included are preadmission medical/surgical history; reconciled medication list; family history; social history and comprehensive review of systems.  Corrections and additions to the records were documented. Comprehensive physical exam was also performed. Additionally a clinical summary was entered for each active diagnosis pertinent to this admission in the Problem List to enhance continuity of care.  HPI: The patient was hospitalized 5/23-5/26/2020 having fallen, sustaining bilateral nondisplaced fracture of the sacral alla, compression fracture S-2 vertebral body, and left thumb dislocation.  He fell backwards while he was attempting to ambulate with crutches.  There was no cardiac or neurologic prodrome prior to the fall.  He also fell several weeks ago, tripping while playing with his daughter outside.   An incidental finding on the CT scan of the pelvis was asymmetric bladder wall thickening.  Past medical and surgical history: Includes end-stage renal disease due to lupus nephritis, HIV on Triumeq, pancytopenia, essential hypertension, dyslipidemia, gastric ulcers, history of gout, history of DVT and GERD. Surgeries and procedures include Heller myotomy and right nephrectomy for abscess.  He was on dialysis until January of this year when he had a renal transplant @ [facility]  Social history: Nondrinker: Never smoked he does not use illicit drugs.  He has no idea how he contracted HIV.  He works in Engineer, technical sales at [employer].  Family history: Limited history reviewed   Review of systems: His only complaint is the sacral area pain which requires him to position, leaning to his right.  Despite the incidental CT findings, he has no GU symptoms Constitutional: No  fever, significant weight change, fatigue  Eyes: No redness, discharge, pain, vision change ENT/mouth: No nasal congestion, purulent discharge, earache, change in hearing, sore throat  Cardiovascular: No chest pain, palpitations, paroxysmal nocturnal dyspnea, claudication, edema  Respiratory: No cough, sputum production, hemoptysis, DOE, significant snoring, apnea Gastrointestinal: No heartburn, dysphagia, abdominal pain, nausea /vomiting, rectal bleeding, melena, change in bowels Genitourinary: No dysuria, hematuria, pyuria, incontinence, nocturia Dermatologic: No rash, pruritus, change in appearance of skin Neurologic: No dizziness, headache, syncope, seizures, numbness, tingling Psychiatric: No significant anxiety, depression, insomnia, anorexia Endocrine: No change in hair/skin/nails, excessive thirst, excessive hunger, excessive urination  Hematologic/lymphatic: No significant bruising, lymphadenopathy, abnormal bleeding Allergy/immunology: No itchy/watery eyes, significant sneezing, urticaria, angioedema  Physical exam:  Pertinent or positive findings: He was interviewed and examined in the quarantine hall of the SNF.  He appears thin and suboptimally nourished but in no acute distress.  He has pattern alopecia.  He has a closely cropped goatee.  Ptosis is present on the left.  He does exhibit a resting tach.  He has scattered low-grade rhonchi and isolated bronchovesicular breath sounds in the right parathoracic area medial to the scapula.  Limbs are atrophic.  The left forearm and hand are in a soft brace.  General appearance: no increased work of breathing is present.   Lymphatic: No lymphadenopathy about the head, neck, axilla. Eyes: No conjunctival inflammation or lid edema is present. There is no scleral icterus. Ears:  External ear exam shows no significant lesions or deformities.   Nose:  External nasal examination shows no deformity or inflammation. Nasal mucosa are pink and moist  without lesions, exudates Oral exam: Lips and gums are healthy appearing.There  is no oropharyngeal erythema or exudate. Neck:  No thyromegaly, masses, tenderness noted.    Heart:  No gallop, murmur, click, rub.  Abdomen: Bowel sounds are normal.  Abdomen is soft and nontender with no organomegaly, hernias, masses. GU: Deferred  Extremities:  No cyanosis, clubbing, edema. Neurologic exam: Balance, Rhomberg, finger to nose testing could not be completed due to clinical state Skin: Warm & dry w/o tenting. No significant lesions or rash.  See clinical summary under each active problem in the Problem List with associated updated therapeutic plan

## 2018-08-28 NOTE — Assessment & Plan Note (Addendum)
08/05/2018 creatinine 1.21, BUN 16, GFR greater than 60

## 2018-08-29 ENCOUNTER — Non-Acute Institutional Stay (SKILLED_NURSING_FACILITY): Payer: Medicare Other | Admitting: Internal Medicine

## 2018-08-29 ENCOUNTER — Encounter: Payer: Self-pay | Admitting: Internal Medicine

## 2018-08-29 DIAGNOSIS — S329XXS Fracture of unspecified parts of lumbosacral spine and pelvis, sequela: Secondary | ICD-10-CM | POA: Diagnosis not present

## 2018-08-29 DIAGNOSIS — F329 Major depressive disorder, single episode, unspecified: Secondary | ICD-10-CM

## 2018-08-29 DIAGNOSIS — F32A Depression, unspecified: Secondary | ICD-10-CM

## 2018-08-29 NOTE — Progress Notes (Signed)
Location:    [facility] Room Number: 539/J Place of Service:  SNF [phone]) Provider:  Kingsley Callander, MD  Patient Care Team: [name], MD as PCP - General [name] [name], MD as PCP - Infectious Diseases (Infectious Diseases) Corliss Parish, MD as Consulting Physician (Nephrology) Malachy Mood (Transplant)  Extended Emergency Contact Information Primary Emergency Contact: [place] Mobile Phone: [phone] Relation: Significant other Secondary Emergency Contact: Merry,Stephanie Address: [place], [place] [address] Johnnette Litter of [place] Mobile Phone: [phone] Relation: Niece  Code Status:  Full Code Goals of care: Advanced Directive information Advanced Directives 08/29/2018  Does Patient Have a Medical Advance Directive? Yes  Type of Advance Directive (No Data)  Does patient want to make changes to medical advance directive? No - Patient declined  Copy of [place] in Chart? No - copy requested  Would patient like information on creating a medical advance directive? No - Patient declined     Chief complaint acute visit secondary to possible depressive type statements   HPI:  Pt is a 63 y.o. male seen today for an acute visit for follow-up of a possible statement indicating depression.  Patient was admitted to this facility this week after hospitalization for a nondisplaced fracture of the sacral alae and compression fracture S2 vertebral body.  Also had a left thumb dislocation.  This was a result of a fall backwards while attempting to ambulate with crutches.  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  He was on dialysis until January 2020 when he had a renal transplant.  During  routine screening by social work he did go through a depression questionnaire apparently made the comment that at times he thinks he might be better off dead.--He denied any plan to hurt himself  However when I spoke to him later today he appeared to be in good spirits- and seemed almost surprised that I would ask him if he was depressed.  He says he has absolutely no plan to harm himself-  he does state he has been through a lot lately which can stress someone- but at this point appears to have a fairly positive attitude and adamantly denied when I asked if he had any plan to harm himself- He also does not think he needs to be on any antidepressant.  I do note he is on low-dose Elavil  Currently he does not complain of discomfort despite his numerous orthopedic issues-he continues to be very pleasant alert and engaging   Past Medical History:  Diagnosis Date  . Achalasia   . Candida infection, esophageal ([place]) 08/2010   a. 2012 - noted on EGD.  Marland Kitchen DVT (deep venous thrombosis) ([place]) ~ 04/2014   ?RLE  . Dysphagia 2008   post heller myotomy/toupee fundoplication.    . ESRD (end stage renal disease) on dialysis [facility])    "MWF: [name]" (01/22/2017)  . Fall 01/22/2017   mechanical fall w/multiple fractures/notes 01/22/2017  . GERD (gastroesophageal reflux disease)    hx (01/22/2017)  . Hemorrhoids, internal   . History of blood transfusion 03/2014   "low counts"  . History of gout   . History of stomach ulcers   . HIV infection ([place]) dx ~  2009   a. 02/13/2014 CD4 = 240 - undetectable viral load.  . Hyperlipidemia   . Hypertension    hx (01/22/2017)  . Insomnia   . Lupus nephritis ([place])   . Pancytopenia ([place])   . Premature ventricular contractions   . Renal abscess   . SLE (systemic lupus erythematosus) ([place]) dx'd 2015   Past Surgical History:  Procedure Laterality Date  . BASCILIC VEIN TRANSPOSITION Left 06/29/2014   Procedure: FIRST STAGE  [place];   Surgeon: Rosetta Posner, MD;  Location: [facility];  Service: Vascular;  Laterality: Left;  . BASCILIC VEIN TRANSPOSITION Left 09/11/2014   Procedure: SECOND STAGE BASILIC VEIN TRANSPOSITION LEFT UPPER ARM;  Surgeon: Rosetta Posner, MD;  Location: [facility];  Service: Vascular;  Laterality: Left;  . COLONOSCOPY  2008   .  tortuous colon, internal hemorrhoids.  no polyps or diverticulosis.   Marland Kitchen ESOPHAGOGASTRODUODENOSCOPY  08/2010   Dr [name].  08/2010 candida esophagitis, no obvious esophageal stricture. 02/2006 and 05/2006 dilated esophagus, narrowed distal esophagus but no stricture, was empirically [place] dilated  . ESOPHAGOGASTRODUODENOSCOPY N/A 06/27/2014   Procedure: ESOPHAGOGASTRODUODENOSCOPY (EGD);  Surgeon: [name], MD;  Location[facility] ENDOSCOPY;  Service: Endoscopy;  Laterality: N/A;  . ESOPHAGOGASTRODUODENOSCOPY N/A 07/02/2014   Procedure: ESOPHAGOGASTRODUODENOSCOPY (EGD);  Surgeon: [name], MD;  [facility] ENDOSCOPY;  Service: Endoscopy;  Laterality: N/A;  with botox  . [place]   with toupee fundoplication of HH.   . INSERTION OF DIALYSIS CATHETER Right 06/29/2014   Procedure: INSERTION OF DIALYSIS CATHETER RIGHT IJ;  Surgeon: Rosetta Posner, MD;  Location: [facility];  Service: Vascular;  Laterality: Right;  . KIDNEY TRANSPLANT     January 2020 @ [facility]  . NEPHRECTOMY Right ~ 2011  . ORIF TIBIA PLATEAU Right 01/23/2017   Procedure: OPEN REDUCTION INTERNAL FIXATION (ORIF) TIBIAL PLATEAU; REPAIR OF LATERAL TIBIAL PLATEAU; ARTHROTOMY WITH MENISCUS REPAIR; ANTERIOR COMPARTMENT FASCIOTOMY;  Surgeon: Altamese Gunnison, MD;  Location: [facility];  Service: Orthopedics;  Laterality: Right;    No Known Allergies  Outpatient Encounter Medications as of 08/29/2018  Medication Sig  . abacavir-dolutegravir-lamiVUDine (TRIUMEQ) 600-50-300 MG tablet Take 1 tablet by mouth daily.  Marland Kitchen amitriptyline (ELAVIL) 10 MG tablet Take 1 tablet (10 mg total) by mouth at bedtime.  Marland Kitchen aspirin EC 81 MG tablet Take 81 mg by  mouth daily.  . calcitRIOL (ROCALTROL) 0.25 MCG capsule Take 0.25 mcg by mouth every Monday, Wednesday, and Friday. FOR ESRD WITH TRANSPLANT (DO NOT CRUSH)  . Cholecalciferol 50 MCG (2000 UT) TABS Take 2,000 Units by mouth daily.  Marland Kitchen HYDROcodone-acetaminophen (NORCO) 7.5-325 MG tablet Take 1 tablet by mouth every 6 (six) hours as needed for moderate pain.  . hydroxychloroquine (PLAQUENIL) 200 MG tablet Take 1 tablet (200 mg total) by mouth 2 (two) times daily.  . mycophenolate (CELLCEPT) 250 MG capsule Take 250 mg by mouth every 12 (twelve) hours.  . predniSONE (DELTASONE) 10 MG tablet Take 1 tablet (10 mg total) by mouth daily.  . tacrolimus (PROGRAF) 0.5 MG capsule Take 1.5 mg by mouth 2 (two) times daily.  . tamsulosin (FLOMAX) 0.4 MG CAPS capsule Take 0.04 mg by mouth daily.  . [DISCONTINUED] calcitRIOL (ROCALTROL) 0.25 MCG capsule Take 3 capsules (0.75 mcg total) by mouth every Monday, Wednesday, and Friday with hemodialysis. (Patient taking differently: Take 0.25 mcg by mouth every Monday, Wednesday, and Friday. FOR ESRD WITH TRANSPLANT (DO NOT CRUSH))   No facility-administered encounter medications on file as  of 08/29/2018.     Review of Systems   In general he is not complaining of fever chills.  Skin does not complain of rashes or itching.  Head ears eyes nose mouth and throat is not complaining of a sore throat or visual changes.  Respiratory does not complain of being short of breath or having cough.  Cardiac is not complaining of chest pain. Or increased edema.  GI does not complain of abdominal discomfort nausea vomiting diarrhea says he has not had a bowel movement in a couple days.  GU is not complaining of dysuria.  Musculoskeletal at this point does not appear to complain of pain at times has complained of some sacral discomfort.  Neurologic he is did not complain of dizziness headache or numbness.  Psych at this point denies overt depression or any plan to harm  himself- does state with everything he has been through it can be stressful but denies any intention to harm himself and appeared to almost laugh at the notion.  .      Immunization History  Administered Date(s) Administered  . DTaP / Hep B / IPV 08/31/2006, 10/02/2006, 02/19/2007  . Hepatitis A, Adult 08/23/2017, 03/21/2018  . Hepatitis B 08/31/2006, 10/02/2006, 02/19/2007  . Hepatitis B, adult 08/23/2017, 08/23/2017, 03/21/2018, 03/21/2018, 04/23/2018, 04/23/2018  . Influenza Whole 01/22/2008  . Influenza-Unspecified 02/02/2015, 02/02/2016, 01/15/2017  . Pneumococcal Conjugate-13 02/27/2017  . Pneumococcal Polysaccharide-23 08/31/2006, 07/10/2017  . Td 11/01/2004  . Tdap 07/10/2017   Pertinent  Health Maintenance Due  Topic Date Due  . COLONOSCOPY  09/29/2018 (Originally 06/01/2016)  . INFLUENZA VACCINE  11/02/2018   Fall Risk  08/22/2018 03/21/2018 08/23/2017 03/01/2017 09/16/2015  Falls in the past year? 1 0 Yes Yes No  Number falls in past yr: 0 - 1 1 -  Injury with Fall? 1 - Yes Yes -   Functional Status Survey:    Vitals:   08/29/18 1622  BP: 130/80  Pulse: 88  Resp: 18  Temp: (!) 96.8 F (36 C)  TempSrc: Oral    Physical Exam   In general this is a very pleasant male in no distress lying comfortably in bed.  His skin is warm and dry.  Eyes visual acuity appears to be intact he does have a history of left eye ptosis.  Oropharynx is clear membranes moist.  Chest is largely clear to auscultation there is a hint of rhonchi but this is not very prominent.  Abdomen is soft nontender with positive bowel sounds.  Musculoskeletal Limited exam since he is in bed he does have some atrophy of his limbs appears to move them at baseline he does have a brace on left lower arm and hand.  Neurologic is grossly intact his speech is clear could not appreciate lateralizing findings.  Psych he is alert and oriented very pleasant and appropriate  Labs reviewed: Recent  Labs    03/07/18 1518 08/25/18 0208  NA 137 139  K 5.4* 3.8  CL 94* 108  CO2 34* 24  GLUCOSE 107* 98  BUN 17 16  CREATININE 7.19* 1.21  CALCIUM 8.9 8.6*   Recent Labs    03/07/18 1518  AST 26  ALT 15  BILITOT 0.5  PROT 8.6*   Recent Labs    03/07/18 1518 08/25/18 0208  WBC 9.3 4.8  NEUTROABS  --  3.0  HGB 14.4 14.1  HCT 41.3 46.1  MCV 94.5 104.3*  PLT 237 185   Lab Results  Component Value  Date   TSH 0.581 01/24/2017   No results found for: HGBA1C Lab Results  Component Value Date   CHOL 185 07/18/2017   HDL 37 (L) 07/18/2017   LDLCALC 120 (H) 07/18/2017   LDLDIRECT 65 07/24/2006   TRIG 158 (H) 07/18/2017   CHOLHDL 5.0 (H) 07/18/2017    Significant Diagnostic Results in last 30 days:  Ct Pelvis Wo Contrast  Result Date: 08/25/2018 CLINICAL DATA:  Left posterior hip pain status post trauma EXAM: CT PELVIS WITHOUT CONTRAST TECHNIQUE: Multidetector CT imaging of the pelvis was performed following the standard protocol without intravenous contrast. COMPARISON:  08/17/2018 FINDINGS: Urinary Tract: There is mild asymmetric bladder wall thickening. 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 Bowel: There is a large amount of stool in the colon. The appendix is not reliably identified. Vascular/Lymphatic: No pathologically enlarged lymph nodes. No significant vascular abnormality seen. Reproductive:  The prostate gland is significantly enlarged. Other:  None. Musculoskeletal: There are new acute minimally displaced bilateral sacral ala fractures. There is a compression fracture of the S2 vertebral body. There is diffuse osteopenia. IMPRESSION: 1. New acute minimally displaced fractures involving the bilateral sacral ala. 2. New compression fracture of the S2 vertebral body. 3. Diffuse osteopenia. 4. Slightly asymmetric bladder wall thickening which is nonspecific. Correlation with urinalysis is recommended. Outpatient urology follow-up is  recommended. There is diffuse prostatomegaly. Electronically Signed   By: Constance Holster M.D.   On: 08/25/2018 01:41   Ct Hip Left Wo Contrast  Result Date: 08/17/2018 CLINICAL DATA:  Left hip pain after fall. EXAM: CT OF THE LEFT HIP WITHOUT CONTRAST TECHNIQUE: Multidetector CT imaging of the left hip was performed according to the standard protocol. Multiplanar CT image reconstructions were also generated. COMPARISON:  Left hip x-rays from same day. CT abdomen pelvis dated June 22, 2014. FINDINGS: Bones/Joint/Cartilage There is a tiny nondisplaced fracture of the anterior left sacral ala (series 3, image 19). No additional fracture. No dislocation. The left hip joint space is preserved. No joint effusion. Ligaments Suboptimally assessed by CT. Muscles and Tendons Grossly intact. Soft tissues No soft tissue mass or fluid collection. IMPRESSION: 1. Tiny nondisplaced fracture of the anterior left sacral ala. 2. No femur fracture. Electronically Signed   By: Titus Dubin M.D.   On: 08/17/2018 17:07   Dg Hand Complete Left  Result Date: 08/24/2018 CLINICAL DATA:  Recent trip and fall with thumb deformity, initial encounter EXAM: LEFT HAND - COMPLETE 3+ VIEW COMPARISON:  None. FINDINGS: There is dislocation at the first MCP joint with lateral displacement of the proximal phalanx with respect to the metacarpal. No acute fracture is seen. No soft tissue abnormality is noted. IMPRESSION: Dislocation at the first MCP joint. Electronically Signed   By: [name] M.D.   On: 08/24/2018 22:16   Dg Finger Thumb Left  Result Date: 08/24/2018 CLINICAL DATA:  Post reduction radiographs EXAM: LEFT THUMB 2+V COMPARISON:  08/24/2018 FINDINGS: The patient is status post reduction at the first metacarpophalangeal joint. The alignment appears significantly improved. There may be some mild palmar subluxation. There is no evidence of a displaced fracture. There is surrounding soft tissue swelling. IMPRESSION:  Improved alignment status post reduction. No evidence of a displaced fracture. Electronically Signed   By: Constance Holster M.D.   On: 08/24/2018 23:28   Dg Hip Unilat W Or Wo Pelvis 2-3 Views Left  Result Date: 08/24/2018 CLINICAL DATA:  Recent fall with left hip pain, initial encounter EXAM:  DG HIP (WITH OR WITHOUT PELVIS) 3V LEFT COMPARISON:  08/17/2018 FINDINGS: There is no evidence of hip fracture or dislocation. There is no evidence of arthropathy or other focal bone abnormality. IMPRESSION: No acute abnormality noted. Electronically Signed   By: [name] M.D.   On: 08/24/2018 22:17   Dg Hip Unilat With Pelvis 2-3 Views Left  Result Date: 08/17/2018 CLINICAL DATA:  Fall and posterior left hip pain. EXAM: DG HIP (WITH OR WITHOUT PELVIS) 2-3V LEFT COMPARISON:  CT of the pelvis 06/22/2014 FINDINGS: Pelvic bony structures are intact. Surgical clips in the right hemipelvic region. No gross abnormality to the right hip joint. Left hip is located without a fracture. IMPRESSION: No acute bone abnormality to the pelvis or left hip. Electronically Signed   By: Markus Daft M.D.   On: 08/17/2018 13:56    Assessment/Plan  #1- history of depression?-At this point patient has deferred any additional medication for this- he denies any attempt to harm himself and almost laughs at the notion- he did state to social worker apparently during screening that at times he thinks he be better off dead- but appears he has no plan to harm himself and apparently this was somewhat of an isolated statement-and when I talked to him he appeared to be in good spirits continues to be gregarious conversational.  2 history of multiple fractures he is weightbearing as tolerated he will be receiving PT and OT and has follow-up with Dr. Doreatha Martin- his Flexeril has been [place] secondary to possibility of this increasing falls and he already does have a history of fall where he sustained the fractures.  At this point will monitor.  He does have an order for hydrocodone on as needed every 6 hours     [id number].

## 2018-09-03 ENCOUNTER — Ambulatory Visit: Payer: Medicare Other | Admitting: Orthopaedic Surgery

## 2018-09-04 LAB — HEPATIC FUNCTION PANEL
ALT: 12 (ref 10–40)
AST: 16 (ref 14–40)
Alkaline Phosphatase: 224 — AB (ref 25–125)
Bilirubin, Total: 0.3

## 2018-09-04 LAB — CBC AND DIFFERENTIAL
HCT: 44 (ref 41–53)
Hemoglobin: 14.4 (ref 13.5–17.5)
Neutrophils Absolute: 2
Platelets: 292 (ref 150–399)
WBC: 3.4

## 2018-09-04 LAB — BASIC METABOLIC PANEL
BUN: 16 (ref 4–21)
Creatinine: 1.1 (ref 0.6–1.3)
Glucose: 96
Potassium: 4.3 (ref 3.4–5.3)
Sodium: 140 (ref 137–147)

## 2018-09-13 ENCOUNTER — Encounter: Payer: Self-pay | Admitting: Internal Medicine

## 2018-09-13 ENCOUNTER — Non-Acute Institutional Stay (SKILLED_NURSING_FACILITY): Payer: Medicare Other | Admitting: Internal Medicine

## 2018-09-13 DIAGNOSIS — R509 Fever, unspecified: Secondary | ICD-10-CM

## 2018-09-13 DIAGNOSIS — Z940 Kidney transplant status: Secondary | ICD-10-CM

## 2018-09-13 DIAGNOSIS — B20 Human immunodeficiency virus [HIV] disease: Secondary | ICD-10-CM

## 2018-09-13 LAB — HEPATIC FUNCTION PANEL
ALT: 28 (ref 10–40)
AST: 26 (ref 14–40)
Alkaline Phosphatase: 193 — AB (ref 25–125)
Bilirubin, Total: 0.3

## 2018-09-13 LAB — BASIC METABOLIC PANEL
BUN: 21 (ref 4–21)
Creatinine: 1.1 (ref 0.6–1.3)
Glucose: 93
Potassium: 4.7 (ref 3.4–5.3)
Sodium: 137 (ref 137–147)

## 2018-09-13 LAB — CBC AND DIFFERENTIAL
HCT: 40 — AB (ref 41–53)
Hemoglobin: 13.2 — AB (ref 13.5–17.5)
Neutrophils Absolute: 4
Platelets: 215 (ref 150–399)
WBC: 5.6

## 2018-09-13 NOTE — Progress Notes (Signed)
This is an acute visit.  Level of care skilled.  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  Complaint-acute visit secondary to fever of unknown origin.  History of present illness.  Patient is a very pleasant 63 year old male seen today for a initial reported elevated temperature of 101.5.  Patient is here for rehab after hospitalization for a nondisplaced fracture of the sacral  alae compression fracture of S2 vertebral body.  He also had a left thumb dislocation.  This was a result of a fall backwards.  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  Apparently during routine vitals this afternoon he was found to have an elevated temperature of 101.5 and nursing staff apparently gave him Tylenol.  When I reevaluated him we were getting varying temperatures once at 101.5 another thermometer said 100.6 and a temporal thermal thermometer actually said he was afebrile.  He does not really have any specific complaints like shortness of breath cough dysuria denies fever chills.  Initiallyhe was slightly tachycardic but on reevaluation heart rate was slower within normal range.  I did speak with Dr. Linna Darner via phone and we were concerned about his immunocompromise status-and thus were aggressive ordering a CBC with differential and metabolic panel as well as a urinalysis and culture and chest x-ray.  I also discussed his situation with his renal transplant physician Dr. Rolan Lipa who was in agreement with the plan- we also discussed starting Rocephin and will start this empirically.  Before I left this evening I did reevaluate patient and he was resting comfortably in bed does not really have any real complaints except possible some mid chest burning when he eats.   But this was not new.  Continues to deny any fever chills stomach pain diarrhea or cough or shortness of breath.  Of note he earlier this week was just discharged from the isolation unit and his coronavirus tests were negative.  Past Medical History:  Diagnosis Date  . Achalasia   . Candida infection, esophageal ([place]) 08/2010   a. 2012 - noted on EGD.  Marland Kitchen DVT (deep venous thrombosis) ([place]) ~ 04/2014   ?RLE  . Dysphagia 2008   post heller myotomy/toupee fundoplication.    . ESRD (end stage renal disease) on dialysis [facility])    "MWF: [name]" (01/22/2017)  . Fall 01/22/2017   mechanical fall w/multiple fractures/notes 01/22/2017  . GERD (gastroesophageal reflux disease)    hx (01/22/2017)  . Hemorrhoids, internal   . History of blood transfusion 03/2014   "low counts"  . History of gout   . History of stomach ulcers   . HIV infection ([place]) dx ~ 2009   a. 02/13/2014 CD4 = 240 - undetectable viral load.  . Hyperlipidemia   . Hypertension    hx (01/22/2017)  . Insomnia   . Lupus nephritis ([place])   . Pancytopenia ([place])   . Premature ventricular contractions   . Renal abscess   . SLE (systemic lupus erythematosus) ([place]) dx'd 2015        Past Surgical History:  Procedure Laterality Date  . BASCILIC VEIN TRANSPOSITION Left 06/29/2014   Procedure: FIRST STAGE  [place];  Surgeon: Rosetta Posner, MD;  Location: [place];  Service: Vascular;  Laterality: Left;  . BASCILIC VEIN TRANSPOSITION Left 09/11/2014  Procedure: SECOND STAGE BASILIC VEIN TRANSPOSITION LEFT UPPER ARM;  Surgeon: Rosetta Posner, MD;  Location: [place];  Service: Vascular;  Laterality: Left;  . COLONOSCOPY  2008   .  tortuous colon, internal hemorrhoids.  no polyps or diverticulosis.   Marland Kitchen ESOPHAGOGASTRODUODENOSCOPY  08/2010   Dr [name].  08/2010 candida esophagitis, no obvious esophageal stricture. 02/2006 and 05/2006 dilated esophagus, narrowed distal esophagus but no  stricture, was empirically [place] dilated  . ESOPHAGOGASTRODUODENOSCOPY N/A 06/27/2014   Procedure: ESOPHAGOGASTRODUODENOSCOPY (EGD);  Surgeon: [name], MD;  Location[facility] ENDOSCOPY;  Service: Endoscopy;  Laterality: N/A;  . ESOPHAGOGASTRODUODENOSCOPY N/A 07/02/2014   Procedure: ESOPHAGOGASTRODUODENOSCOPY (EGD);  Surgeon: [name], MD;  Location: [facility] ENDOSCOPY;  Service: Endoscopy;  Laterality: N/A;  with botox  . [place]   with toupee fundoplication of HH.   . INSERTION OF DIALYSIS CATHETER Right 06/29/2014   Procedure: INSERTION OF DIALYSIS CATHETER RIGHT IJ;  Surgeon: Rosetta Posner, MD;  Location: [place];  Service: Vascular;  Laterality: Right;  . KIDNEY TRANSPLANT     January 2020 @ [facility]  . NEPHRECTOMY Right ~ 2011  . ORIF TIBIA PLATEAU Right 01/23/2017   Procedure: OPEN REDUCTION INTERNAL FIXATION (ORIF) TIBIAL PLATEAU; REPAIR OF LATERAL TIBIAL PLATEAU; ARTHROTOMY WITH MENISCUS REPAIR; ANTERIOR COMPARTMENT FASCIOTOMY;  Surgeon: Altamese [place], MD;  Location: [place];  Service: Orthopedics;  Laterality: Right;    No Known Allergies    MEDICATIONS     Medication Sig  . abacavir-dolutegravir-lamiVUDine (TRIUMEQ) 600-50-300 MG tablet Take 1 tablet by mouth daily.  Marland Kitchen amitriptyline (ELAVIL) 10 MG tablet Take 1 tablet (10 mg total) by mouth at bedtime.  Marland Kitchen aspirin EC 81 MG tablet Take 81 mg by mouth daily.  . calcitRIOL (ROCALTROL) 0.25 MCG capsule Take 0.25 mcg by mouth every Monday, Wednesday, and Friday. FOR ESRD WITH TRANSPLANT (DO NOT CRUSH)  . Cholecalciferol 50 MCG (2000 UT) TABS Take 2,000 Units by mouth daily.  Marland Kitchen HYDROcodone-acetaminophen (NORCO) 7.5-325 MG tablet Take 1 tablet by mouth every 6 (six) hours as needed for moderate pain.  . hydroxychloroquine (PLAQUENIL) 200 MG tablet Take 1 tablet (200 mg total) by mouth 2 (two) times daily.  . mycophenolate (CELLCEPT) 250 MG capsule Take 250 mg by mouth every 12 (twelve) hours.  . predniSONE (DELTASONE) 10 MG  tablet Take 1 tablet (10 mg total) by mouth daily.  . tacrolimus (PROGRAF) 0.5 MG capsule Take 1.5 mg by mouth 2 (two) times daily.  . tamsulosin (FLOMAX) 0.4 MG CAPS capsule Take 0.04 mg by mouth daily.  Marland Kitchen  DISCONTINUED calcitRIOL (ROCALTROL) 0.25 MCG capsule Take 3 capsules (0.75 mcg total) by mouth every Monday, Wednesday, and Friday with hemodialysis. (Patient taking differently: Take 0.25 mcg by mouth every Monday, Wednesday, and Friday. FOR ESRD WITH TRANSPLANT (DO NOT CRUSH))   Of note he is on sulfamethoxazole-TMP-1 tablet p.o. daily on Monday Wednesdays and Fridays for prophylaxis   Review of systems.  General is not complaining of any fever or chills.  Skin does not complain of rashes or itching or diaphoresis.  Head ears eyes nose mouth and throat is not complaining of visual changes or sore throat  Cardiac does not complain of chest pain or edema.  GI does not complain of abdominal discomfort nausea vomiting diarrhea constipation says his appetite is not great this afternoon.  GU denies dysuria he is status post renal transplant.  Musculoskeletal has weakness but does not complain of joint pain.  Neurologic does not complain  of dizziness headache or syncope.  And psych does not really complain of being depressed or anxious continues to be very pleasant propria.  Physical exam.  Temperature originally noted to be 101.5 during the  and subsequent 100.6- 99.3 and this evening was actually 96.5  General this is a pleasant male in no distress at times sitting comfortably on side of the bed in at times lying in bed.  His skin is warm and dry he is not diaphoretic.  Eyes visual acuity appears to be intact sclera and conjunctive are clear.  Oropharynx is clear mucous membranes moist.  Chest is clear to auscultation could not appreciate any labored breathing or overt congestion.  Heart is regular rate and rhythm without murmur gallop or rub  Abdomen is soft nontender  with positive bowel sounds  Musculoskeletal does appear generally frail but is able to move all extremities x4 at baseline.  Neurologic is grossly intact his speech is clear could not appreciate lateralizing findings.  Psych he is alert and oriented pleasant and appropriate  Labs.  September 04, 2018.  Sodium 140 potassium 4.3 BUN 15.5 creatinine 1.09.  Alkaline phosphatase 224 otherwise liver function tests within normal limits.  WBC 3.4 hemoglobin 14.4 platelets 292.  Assessment and plan.  As noted above plan was discussed with Dr. Linna Darner via phone as well as with renal transplant physician Dr. Rolan Lipa.  We will start Rocephin 1 g IM daily for 5 days.  Also will obtain stat blood work including a CBC with differential and comprehensive metabolic panel-obtain a urinalysis and culture as well as a two-view chest x-ray.  Continue to monitor vital signs pulse ox every 4 hours notify provider of any increased temp or change in status.  I did check on him several times throughout the day and appears his temperatures were more in  68 's range  he continued to be at his baseline without significant complaint.  He does continue continue to be vulnerable however with his history of renal transplant and immunocompromise status with history of HIV as well.  [id number] note greater than 60 minutes spent assessing patient- reassessing patient- reviewing his chart and labs discussing his status with renal transplant physician as well as with Dr. Virl Diamond formulating a plan of care with input as noted above  Of note greater than 50% of time spent coordinating plan of care with input as noted above.  -- Addendum.  Stat labs obtained this evening appear to be fairly unremarkable His white count is 5.6 hemoglobin 13.2 platelets 215.     sodium 137 potassium 4.7 BUN 21 creatinine 1.08-liver function test within normal limits except alkaline phosphatase of 193 which is actually down  from previous level.   Marland Kitchen

## 2018-09-15 ENCOUNTER — Encounter: Payer: Self-pay | Admitting: Internal Medicine

## 2018-09-16 ENCOUNTER — Encounter: Payer: Self-pay | Admitting: Internal Medicine

## 2018-09-16 ENCOUNTER — Non-Acute Institutional Stay (SKILLED_NURSING_FACILITY): Payer: Medicare Other | Admitting: Internal Medicine

## 2018-09-16 DIAGNOSIS — W19XXXD Unspecified fall, subsequent encounter: Secondary | ICD-10-CM

## 2018-09-16 DIAGNOSIS — M3214 Glomerular disease in systemic lupus erythematosus: Secondary | ICD-10-CM

## 2018-09-16 DIAGNOSIS — R509 Fever, unspecified: Secondary | ICD-10-CM

## 2018-09-16 DIAGNOSIS — S329XXS Fracture of unspecified parts of lumbosacral spine and pelvis, sequela: Secondary | ICD-10-CM | POA: Diagnosis not present

## 2018-09-16 LAB — NOVEL CORONAVIRUS, NAA: SARS-CoV-2, NAA: NOT DETECTED

## 2018-09-16 NOTE — Progress Notes (Signed)
Location:  [facility] Room Number: 536-R Place of Service:  SNF [phone]) Provider:  Granville Lewis, PA-C  [name], MD  Patient Care Team: [name], MD as PCP - General [name] [name], MD as PCP - Infectious Diseases (Infectious Diseases) Corliss Parish, MD as Consulting Physician (Nephrology) Malachy Mood (Transplant)  Extended Emergency Contact Information Primary Emergency Contact: [place] of [place] Mobile Phone: [phone] Relation: Significant other Secondary Emergency Contact: Verga,Stephanie Address: [place], [place] [address] Johnnette Litter of [place] Mobile Phone: [phone] Relation: Niece  Code Status:  Full Code  Goals of care: Advanced Directive information Advanced Directives 08/29/2018  Does Patient Have a Medical Advance Directive? Yes  Type of Advance Directive (No Data)  Does patient want to make changes to medical advance directive? No - Patient declined  Copy of [place] in Chart? No - copy requested  Would patient like information on creating a medical advance directive? No - Patient declined     Chief Complaint  Patient presents with  . Acute Visit    Followup of fever    HPI:  Pt is a 63 y.o. male seen today for an acute visit for follow-up of fever of unknown origin.  Patient was seen last Friday for initially reported fever of 101.5 Friday afternoon.  Subsequent work-up included blood work and a chest x-ray as well as a urine all which appeared to be fairly baseline and fairly unremarkable.  He had largely afebrile temperatures or temperatures in the lower mid 99's since then.  However apparently Saturday night he did have a temperature slightly over 100 again.  He continued to have no complaints of fever chills dysuria shortness of breath or cough.  Out of caution he was transferred back to the isolation unit in aCovid 19 test is  pending.  Previous tests in the facility have been negative.  He is here for rehab after hospitalization for a nondisplaced fracture of the sacralalea compression fracture of S2 vertebral body as well as a left thumb dislocation.  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  He has complained of some GERD-like symptoms and dysphasia and actually was seen by speech therapy earlier today.  He actually was slated for discharge tomorrow-this was discussed with him social work also discussed with them and he is is agreeable to stay a few more days.  Apparently overnight he did have a fall he describes this as slipping and falling on his right buttocks hip area says he says at times it feels a little sore but not acutely so he did participate with therapy today and apparently did fairly well.  At this point he has declined any further work-up like an x-ray saying he does not believe he has anything acute going on.  Currently he is resting in bed comfortably currently afebrile continues to deny any fever chills diarrhea dysuria cough or shortness of breath.     Past Medical History:  Diagnosis Date  . Achalasia   . Candida infection, esophageal ([employer]) 08/2010   a. 2012 - noted on EGD.  Marland Kitchen DVT (deep venous thrombosis) ([place]) ~ 04/2014   ?RLE  . Dysphagia 2008   post heller myotomy/toupee fundoplication.    Marland Kitchen  ESRD (end stage renal disease) on dialysis [facility])    "MWF: [name]" (01/22/2017)  . Fall 01/22/2017   mechanical fall w/multiple fractures/notes 01/22/2017  . GERD (gastroesophageal reflux disease)    hx (01/22/2017)  . Hemorrhoids, internal   . History of blood transfusion 03/2014   "low counts"  . History of gout   . History of stomach ulcers   . HIV infection ([place]) dx ~ 2009   a. 02/13/2014 CD4 =  240 - undetectable viral load.  . Hyperlipidemia   . Hypertension    hx (01/22/2017)  . Insomnia   . Lupus nephritis ([place])   . Pancytopenia ([place])   . Premature ventricular contractions   . Renal abscess   . SLE (systemic lupus erythematosus) ([place]) dx'd 2015   Past Surgical History:  Procedure Laterality Date  . BASCILIC VEIN TRANSPOSITION Left 06/29/2014   Procedure: FIRST STAGE  [place];  Surgeon: Rosetta Posner, MD;  Location: [facility];  Service: Vascular;  Laterality: Left;  . BASCILIC VEIN TRANSPOSITION Left 09/11/2014   Procedure: SECOND STAGE BASILIC VEIN TRANSPOSITION LEFT UPPER ARM;  Surgeon: Rosetta Posner, MD;  Location: [facility];  Service: Vascular;  Laterality: Left;  . COLONOSCOPY  2008   .  tortuous colon, internal hemorrhoids.  no polyps or diverticulosis.   Marland Kitchen ESOPHAGOGASTRODUODENOSCOPY  08/2010   Dr [name].  08/2010 candida esophagitis, no obvious esophageal stricture. 02/2006 and 05/2006 dilated esophagus, narrowed distal esophagus but no stricture, was empirically [place] dilated  . ESOPHAGOGASTRODUODENOSCOPY N/A 06/27/2014   Procedure: ESOPHAGOGASTRODUODENOSCOPY (EGD);  Surgeon: [name], MD;  Location: [facility] ENDOSCOPY;  Service: Endoscopy;  Laterality: N/A;  . ESOPHAGOGASTRODUODENOSCOPY N/A 07/02/2014   Procedure: ESOPHAGOGASTRODUODENOSCOPY (EGD);  Surgeon: [name], MD;  Location: [facility] ENDOSCOPY;  Service: Endoscopy;  Laterality: N/A;  with botox  . [place]   with toupee fundoplication of HH.   . INSERTION OF DIALYSIS CATHETER Right 06/29/2014   Procedure: INSERTION OF DIALYSIS CATHETER RIGHT IJ;  Surgeon: Rosetta Posner, MD;  Location: [facility];  Service: Vascular;  Laterality: Right;  . KIDNEY TRANSPLANT     January 2020 @ [facility]  . NEPHRECTOMY Right ~ 2011  . ORIF TIBIA PLATEAU Right 01/23/2017   Procedure: OPEN REDUCTION INTERNAL FIXATION (ORIF) TIBIAL PLATEAU; REPAIR OF LATERAL TIBIAL PLATEAU; ARTHROTOMY WITH MENISCUS REPAIR; ANTERIOR COMPARTMENT  FASCIOTOMY;  Surgeon: Altamese St. Vincent, MD;  Location: [facility];  Service: Orthopedics;  Laterality: Right;    No Known Allergies  Outpatient Encounter Medications as of 09/16/2018  Medication Sig  . abacavir-dolutegravir-lamiVUDine (TRIUMEQ) 600-50-300 MG tablet Take 1 tablet by mouth daily.  Marland Kitchen amitriptyline (ELAVIL) 10 MG tablet Take 1 tablet (10 mg total) by mouth at bedtime.  Marland Kitchen aspirin EC 81 MG tablet Take 81 mg by mouth daily.  . calcitRIOL (ROCALTROL) 0.25 MCG capsule Take 0.25 mcg by mouth every Monday, Wednesday, and Friday. FOR ESRD WITH TRANSPLANT (DO NOT CRUSH)  . [START ON 09/17/2018] cefTRIAXone (ROCEPHIN) 1 g injection Inject 1 g into the muscle daily.  . Cholecalciferol 50 MCG (2000 UT) TABS Take 2,000 Units by mouth daily.   Marland Kitchen HYDROcodone-acetaminophen (NORCO) 7.5-325 MG tablet Take 1 tablet by mouth every 6 (six) hours as needed for moderate pain.  . hydroxychloroquine (PLAQUENIL) 200 MG tablet Take 1 tablet (200 mg total) by mouth 2 (two) times daily.  . mycophenolate (CELLCEPT) 250 MG capsule Take 250 mg by mouth every 12 (twelve) hours.   . Nutritional Supplement LIQD Take  120 mLs by mouth 2 (two) times a day. MedPass  . predniSONE (DELTASONE) 10 MG tablet Take 1 tablet (10 mg total) by mouth daily.  Marland Kitchen saccharomyces boulardii (FLORASTOR) 250 MG capsule Take 250 mg by mouth daily. x14 days  . sulfamethoxazole-trimethoprim (BACTRIM DS) 800-160 MG tablet Take 1 tablet by mouth every Monday, Wednesday, and Friday.  . tacrolimus (PROGRAF) 0.5 MG capsule Take 1.5 mg by mouth 2 (two) times daily.   . tamsulosin (FLOMAX) 0.4 MG CAPS capsule Take 0.04 mg by mouth daily.   No facility-administered encounter medications on file as of 09/16/2018.     Review of Systems  General is not complaining of any fever or chills again he did have a elevated temperature again Saturday night.  Skin does not complain of rashes itching or diaphoresis.  Head ears eyes nose mouth and throat does not  complain of visual changes or sore throat.  Respiratory is not complaining of shortness of breath or cough.  Cardiac does not complain of chest pain or edema.  GI does not complain of abdominal pain nausea vomiting diarrhea or constipation- does complain at times of some dysphasia and has been seen by speech therapy earlier today.  GU is not complaining of dysuria he does have a history of renal transplant.  Musculoskeletal does not complain of joint pain it appears to have gotten stronger at times he does ask for pain medication.  Neurologic is not complaining of dizziness headache or numbness or syncope.  Psych does not complain of overt depression or anxiety continues to be very pleasant and appropriate would like to go home  Immunization History  Administered Date(s) Administered  . DTaP / Hep B / IPV 08/31/2006, 10/02/2006, 02/19/2007  . Hepatitis A, Adult 08/23/2017, 03/21/2018  . Hepatitis B 08/31/2006, 10/02/2006, 02/19/2007  . Hepatitis B, adult 08/23/2017, 08/23/2017, 03/21/2018, 03/21/2018, 04/23/2018, 04/23/2018  . Influenza Whole 01/22/2008  . Influenza-Unspecified 02/02/2015, 02/02/2016, 01/15/2017  . Pneumococcal Conjugate-13 02/27/2017  . Pneumococcal Polysaccharide-23 08/31/2006, 07/10/2017  . Td 11/01/2004  . Tdap 07/10/2017   Pertinent  Health Maintenance Due  Topic Date Due  . COLONOSCOPY  09/29/2018 (Originally 06/01/2016)  . INFLUENZA VACCINE  11/02/2018   Fall Risk  08/22/2018 03/21/2018 08/23/2017 03/01/2017 09/16/2015  Falls in the past year? 1 0 Yes Yes No  Number falls in past yr: 0 - 1 1 -  Injury with Fall? 1 - Yes Yes -   Functional Status Survey:    Vitals:   09/16/18 1214  BP: 128/76  Pulse: 81  Resp: 20  Temp: 99.5 F (37.5 C)  TempSrc: Oral  SpO2: 99%  Weight: 118 lb 3.2 oz (53.6 kg)  Height: 5\' 11"  (1.803 m)  Recent temperatures have average more in the 98- to mid 99's area Body mass index is 16.49 kg/m. Physical Exam   General  this is a pleasant male in no distress lying comfortably in bed.  His skin is warm and dry he is not diaphoretic.  Eyes visual acuity remains intact sclera and conjunctive are clear.  Oropharynx is clear mucous membranes moist.  Chest is clear to auscultation there is no labored breathing.  Heart is regular rate and rhythm borderline tachycardic high 90s to 100- he does not have significant lower extremity edema.  Abdomen is soft nontender with positive bowel sounds.  Musculoskeletal is generally frail at baseline but able to move his extremities at baseline limited exam since he is in bed but does not really complain of any  acute discomfort with flexion and extension at the hips I do not note any deformity there is muscle wasting but this is baseline  Neurologic is grossly intact his speech is clear could not really appreciate lateralizing findings.  Psych he is alert and oriented continues to be very pleasant and appropriate.    Labs reviewed:  September 13, 2018.  Sodium 137 potassium 4.7 BUN 21 creatinine 1.08.  Alkaline phosphatase 193.  WBC 5.6 hemoglobin 13.2 platelets 215.   Recent Labs    03/07/18 1518 08/25/18 0208  NA 137 139  K 5.4* 3.8  CL 94* 108  CO2 34* 24  GLUCOSE 107* 98  BUN 17 16  CREATININE 7.19* 1.21  CALCIUM 8.9 8.6*   Recent Labs    03/07/18 1518  AST 26  ALT 15  BILITOT 0.5  PROT 8.6*   Recent Labs    03/07/18 1518 08/25/18 0208  WBC 9.3 4.8  NEUTROABS  --  3.0  HGB 14.4 14.1  HCT 41.3 46.1  MCV 94.5 104.3*  PLT 237 185   Lab Results  Component Value Date   TSH 0.581 01/24/2017   No results found for: HGBA1C Lab Results  Component Value Date   CHOL 185 07/18/2017   HDL 37 (L) 07/18/2017   LDLCALC 120 (H) 07/18/2017   LDLDIRECT 65 07/24/2006   TRIG 158 (H) 07/18/2017   CHOLHDL 5.0 (H) 07/18/2017    Significant Diagnostic Results in last 30 days:  Ct Pelvis Wo Contrast  Result Date: 08/25/2018 CLINICAL DATA:  Left  posterior hip pain status post trauma EXAM: CT PELVIS WITHOUT CONTRAST TECHNIQUE: Multidetector CT imaging of the pelvis was performed following the standard protocol without intravenous contrast. COMPARISON:  08/17/2018 FINDINGS: Urinary Tract: There is mild asymmetric bladder wall thickening. 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 Bowel: There is a large amount of stool in the colon. The appendix is not reliably identified. Vascular/Lymphatic: No pathologically enlarged lymph nodes. No significant vascular abnormality seen. Reproductive:  The prostate gland is significantly enlarged. Other:  None. Musculoskeletal: There are new acute minimally displaced bilateral sacral ala fractures. There is a compression fracture of the S2 vertebral body. There is diffuse osteopenia. IMPRESSION: 1. New acute minimally displaced fractures involving the bilateral sacral ala. 2. New compression fracture of the S2 vertebral body. 3. Diffuse osteopenia. 4. Slightly asymmetric bladder wall thickening which is nonspecific. Correlation with urinalysis is recommended. Outpatient urology follow-up is recommended. There is diffuse prostatomegaly. Electronically Signed   By: Constance Holster M.D.   On: 08/25/2018 01:41   Ct Hip Left Wo Contrast  Result Date: 08/17/2018 CLINICAL DATA:  Left hip pain after fall. EXAM: CT OF THE LEFT HIP WITHOUT CONTRAST TECHNIQUE: Multidetector CT imaging of the left hip was performed according to the standard protocol. Multiplanar CT image reconstructions were also generated. COMPARISON:  Left hip x-rays from same day. CT abdomen pelvis dated June 22, 2014. FINDINGS: Bones/Joint/Cartilage There is a tiny nondisplaced fracture of the anterior left sacral ala (series 3, image 19). No additional fracture. No dislocation. The left hip joint space is preserved. No joint effusion. Ligaments Suboptimally assessed by CT. Muscles and Tendons Grossly intact. Soft tissues No  soft tissue mass or fluid collection. IMPRESSION: 1. Tiny nondisplaced fracture of the anterior left sacral ala. 2. No femur fracture. Electronically Signed   By: Titus Dubin M.D.   On: 08/17/2018 17:07   Dg Hand Complete Left  Result Date: 08/24/2018 CLINICAL  DATA:  Recent trip and fall with thumb deformity, initial encounter EXAM: LEFT HAND - COMPLETE 3+ VIEW COMPARISON:  None. FINDINGS: There is dislocation at the first MCP joint with lateral displacement of the proximal phalanx with respect to the metacarpal. No acute fracture is seen. No soft tissue abnormality is noted. IMPRESSION: Dislocation at the first MCP joint. Electronically Signed   By: [name] M.D.   On: 08/24/2018 22:16   Dg Finger Thumb Left  Result Date: 08/24/2018 CLINICAL DATA:  Post reduction radiographs EXAM: LEFT THUMB 2+V COMPARISON:  08/24/2018 FINDINGS: The patient is status post reduction at the first metacarpophalangeal joint. The alignment appears significantly improved. There may be some mild palmar subluxation. There is no evidence of a displaced fracture. There is surrounding soft tissue swelling. IMPRESSION: Improved alignment status post reduction. No evidence of a displaced fracture. Electronically Signed   By: Constance Holster M.D.   On: 08/24/2018 23:28   Dg Hip Unilat W Or Wo Pelvis 2-3 Views Left  Result Date: 08/24/2018 CLINICAL DATA:  Recent fall with left hip pain, initial encounter EXAM: DG HIP (WITH OR WITHOUT PELVIS) 3V LEFT COMPARISON:  08/17/2018 FINDINGS: There is no evidence of hip fracture or dislocation. There is no evidence of arthropathy or other focal bone abnormality. IMPRESSION: No acute abnormality noted. Electronically Signed   By: [name] M.D.   On: 08/24/2018 22:17    Assessment/Plan  Fever of unknown origin-as noted above last Friday afternoon he had possible temperature 101.5 subsequent readings later in that afternoon were 100.6 with temporal thermometer actually showed  him to be afebrile and apparently became essentially afebrile later in the day-however Saturday night he did have apparently a spike a little over 100.  He was moved to the isolation unit out of caution.  Appears his recent temps have been more in the 98 to mid 99's range he continues to deny any fever chills or symptoms.  Work-up again included a chest x-ray and urine which appear to be negative white count was not elevated blood work appeared to be fairly unremarkable.  We are awaiting COVID-19 results previous results have been negative.  Currently he appears to be at baseline.  We will continue to monitor and continues in the isolation unit out of caution.  2.  History of GERD achalasia.--He does have a history here apparently achalasia was diagnosed 1999 and did have a surgical Heller myotomy with fundoplication and did well.  But then apparently continued to have some food retention and he underwent a Botox injection into the lower esophageal splinter.  This help for a while but apparently has had recurrent esophageal dysphagia.  There appears to be plans for further work-up including an EGD.  At this point is not having any acute distress but at times does have some discomfort and was seen by speech therapy today  3 in regards to HIV he is followed by infectious disease [name] here and also followed at [facility]. He continues on Triumeq  #4 history of renal transplant he is followed by [name] renal transplant team. For lupus nephritis continues on Plaquenil.  As well as CellCept Prograf and prednisone.  5.  He does have a history of asymmetric bladder wall thickening and will need outpatient urology follow-up when he is discharged this was an incidental finding on a CT of the pelvis done in the hospital.  6.  Fall at nursing facility-again he denies really any acute injury here other than occasional soreness  of the hip but does not believe this is anything significant I told him  if it worsens at all or he is concerned to certainly let us know and we will order an x-ray he does not feel it is nearly significant enough for that at this point\--apparently did participate with therapy today and they have not expressed any concerns  Again currently he appears to be stable does not complain of being febrile having a cough or shortness of breath vital signs appear to be fairly stable but this will have to be watched continues on isolation for now.  [id number]      There was recommendation to proceed with an esophageal high-resolution manometry to define his esophageal  motility.  Apparently the plan is at some point to have an EGD.  I suspect this is been complicated by coronavirus concerns but will need follow-up

## 2018-09-17 ENCOUNTER — Encounter: Payer: Self-pay | Admitting: Adult Health

## 2018-09-17 ENCOUNTER — Non-Acute Institutional Stay (SKILLED_NURSING_FACILITY): Payer: Medicare Other | Admitting: Adult Health

## 2018-09-17 DIAGNOSIS — R000 Tachycardia, unspecified: Secondary | ICD-10-CM

## 2018-09-17 DIAGNOSIS — R1310 Dysphagia, unspecified: Secondary | ICD-10-CM | POA: Diagnosis not present

## 2018-09-17 DIAGNOSIS — R1319 Other dysphagia: Secondary | ICD-10-CM

## 2018-09-17 LAB — CBC AND DIFFERENTIAL
HCT: 40 — AB (ref 41–53)
Hemoglobin: 13.6 (ref 13.5–17.5)
Neutrophils Absolute: 1
Platelets: 175 (ref 150–399)
WBC: 1.3

## 2018-09-17 LAB — BASIC METABOLIC PANEL
BUN: 26 — AB (ref 4–21)
Creatinine: 1.3 (ref 0.6–1.3)
Glucose: 103
Potassium: 4.6 (ref 3.4–5.3)
Sodium: 136 — AB (ref 137–147)

## 2018-09-17 LAB — HEPATIC FUNCTION PANEL
ALT: 18 (ref 10–40)
AST: 22 (ref 14–40)
Alkaline Phosphatase: 151 — AB (ref 25–125)
Bilirubin, Total: 0.3

## 2018-09-17 NOTE — Progress Notes (Signed)
Location:  [facility] Room Number: 300-P Place of Service:  SNF (31) Provider:  Durenda Age, DNP, FNP-BC  Patient Care Team: [name], MD as PCP - General [name] [name], MD as PCP - Infectious Diseases (Infectious Diseases) Corliss Parish, MD as Consulting Physician (Nephrology) Malachy Mood (Transplant)  Extended Emergency Contact Information Primary Emergency Contact: [place] Mobile Phone: [phone] Relation: Significant other Secondary Emergency Contact: Bloodgood,Stephanie Address: [place], [place] [address] Johnnette Litter of [place] Mobile Phone: [phone] Relation: Niece  Code Status:  Full Code  Goals of care: Advanced Directive information Advanced Directives 08/29/2018  Does Patient Have a Medical Advance Directive? Yes  Type of Advance Directive (No Data)  Does patient want to make changes to medical advance directive? No - Patient declined  Copy of [place] in Chart? No - copy requested  Would patient like information on creating a medical advance directive? No - Patient declined     Chief Complaint  Patient presents with   Acute Visit    Patient having tachycardia (pulse 115)    HPI:  Pt is a 63 y.o. male seen today for an acute visit for tachycardia (heart rate 115). He is a short-term rehabilitation of [facility].  He has a PMH of lupus nephritis, HIV, DVT, ESRD, GERD, HLD, and HTN. He was seen in his room today. He was sleeping and immediately woke up to my verbal greeting. He looks relaxed and not in pain. Charge nurse reported him having tachycardia. When asked if he is in pain, he said that he has pain when he puts pressure on his feet (standing up). He denies having chest pain. He was admitted to [facility] on 08/27/18 after a recent hospitalization S/P fall sustaining sacral fracture. He is WBAT and  currently having PT and OT. He complains of difficulty swallowing. He has a past medical history of achalasia, achalasia of the esophagus on imagine (09/05/18). He had a televisit with [facility] on 09/05/18. GI plans to have EGD with flip and esophageal HRM. He had a low grade fever, 99.5 F, yesterday. Today temperature is 97.7.   Past Medical History:  Diagnosis Date   Achalasia    Candida infection, esophageal ([place]) 08/2010   a. 2012 - noted on EGD.   DVT (deep venous thrombosis) ([place]) ~ 04/2014   ?RLE   Dysphagia 2008   post heller myotomy/toupee fundoplication.     ESRD (end stage renal disease) on dialysis [facility])    "MWF: [name]" (01/22/2017)   Fall 01/22/2017   mechanical fall w/multiple fractures/notes 01/22/2017   GERD (gastroesophageal reflux disease)    hx (01/22/2017)   Hemorrhoids, internal    History of blood transfusion 03/2014   "low counts"   History of gout    History of stomach ulcers    HIV infection ([place]) dx ~ 2009   a. 02/13/2014 CD4 = 240 - undetectable viral load.   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  tortuous colon, internal hemorrhoids.  no polyps or diverticulosis.    ESOPHAGOGASTRODUODENOSCOPY  08/2010   Dr [name].  08/2010 candida esophagitis, no obvious esophageal  stricture. 02/2006 and 05/2006 dilated esophagus, narrowed distal esophagus but no stricture, was empirically [place] dilated   ESOPHAGOGASTRODUODENOSCOPY N/A 06/27/2014   Procedure: ESOPHAGOGASTRODUODENOSCOPY (EGD);  Surgeon: [name], MD;  Location: [facility] ENDOSCOPY;  Service: Endoscopy;  Laterality: N/A;   ESOPHAGOGASTRODUODENOSCOPY N/A 07/02/2014   Procedure: ESOPHAGOGASTRODUODENOSCOPY (EGD);  Surgeon: [name], MD;  Location: [facility] ENDOSCOPY;  Service: Endoscopy;  Laterality: N/A;  with botox   [place]   with toupee fundoplication of HH.    INSERTION OF DIALYSIS CATHETER Right 06/29/2014   Procedure: INSERTION OF DIALYSIS CATHETER RIGHT IJ;  Surgeon: Rosetta Posner, MD;  Location: 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   amitriptyline (ELAVIL) 10 MG tablet Take 1 tablet (10 mg total) by mouth at bedtime.   aspirin EC 81 MG tablet Take 81 mg by mouth daily.   calcitRIOL (ROCALTROL) 0.25 MCG capsule Take 0.25 mcg by mouth every Monday, Wednesday, and Friday. FOR ESRD WITH TRANSPLANT (DO NOT CRUSH)   cefTRIAXone (ROCEPHIN) 1 g injection Inject 1 g into the muscle daily.   Cholecalciferol 50 MCG (2000 UT) TABS Take 2,000 Units by mouth daily.    HYDROcodone-acetaminophen (NORCO) 7.5-325 MG tablet Take 1 tablet by mouth every 6 (six) hours as needed for moderate pain.   hydroxychloroquine (PLAQUENIL) 200 MG tablet Take 1  tablet (200 mg total) by mouth 2 (two) times daily.   mycophenolate (CELLCEPT) 250 MG capsule Take 250 mg by mouth every 12 (twelve) hours.    Nutritional Supplement LIQD Take 120 mLs by mouth 2 (two) times a day. Med[employer]   predniSONE (DELTASONE) 10 MG tablet Take 1 tablet (10 mg total) by mouth daily.   saccharomyces boulardii (FLORASTOR) 250 MG capsule Take 250 mg by mouth daily. x14 days   sulfamethoxazole-trimethoprim (BACTRIM DS) 800-160 MG tablet Take 1 tablet by mouth every Monday, Wednesday, and Friday.   tacrolimus (PROGRAF) 0.5 MG capsule Take 1.5 mg by mouth 2 (two) times daily.    tamsulosin (FLOMAX) 0.4 MG CAPS capsule Take 0.04 mg by mouth daily.   No facility-administered encounter medications on file as of 09/17/2018.     Review of Systems  GENERAL: +fever MOUTH and THROAT: Denies oral discomfort, gingival pain or bleeding, pain from teeth or hoarseness   RESPIRATORY: no cough, SOB, DOE, wheezing, hemoptysis CARDIAC: No chest pain, edema or palpitations GI: No abdominal pain, diarrhea, constipation, heart burn, nausea or vomiting NEUROLOGICAL: Denies dizziness, syncope, numbness, or headache  PSYCHIATRIC: Denies feelings of depression or anxiety. No report of hallucinations, insomnia, paranoia, or agitation    Immunization History  Administered Date(s) Administered   DTaP / Hep B / IPV 08/31/2006, 10/02/2006, 02/19/2007   Hepatitis A, Adult 08/23/2017, 03/21/2018   Hepatitis B 08/31/2006, 10/02/2006, 02/19/2007   Hepatitis B, adult 08/23/2017, 08/23/2017, 03/21/2018, 03/21/2018, 04/23/2018, 04/23/2018   Influenza Whole 01/22/2008   Influenza, Seasonal, Injecte, Preservative Fre 04/24/2016, 01/05/2017   Influenza-Unspecified 02/02/2016, 01/15/2017, 01/01/2018   Pneumococcal Conjugate-13 02/27/2017   Pneumococcal Polysaccharide-23 08/31/2006, 07/10/2017   Td 11/01/2004   Tdap 07/10/2017   Pertinent  Health Maintenance Due  Topic Date Due    COLONOSCOPY  09/29/2018 (Originally 06/01/2016)   INFLUENZA VACCINE  11/02/2018   Fall Risk  08/22/2018 03/21/2018 08/23/2017 03/01/2017 09/16/2015  Falls in the past year? 1 0 Yes Yes No  Number falls in past yr: 0 - 1 1 -  Injury with Fall? 1 - Yes Yes -     Vitals:   09/17/18 1106  BP: 110/60  Pulse: (!) 115  Resp: 20  Temp: 97.7 F (36.5 C)  TempSrc: Oral  SpO2: 99%  Weight: 118 lb 3.2 oz (53.6 kg)  Height: 5' 11"  (1.803 m)   Body mass index is 16.49 kg/m.  Physical Exam  GENERAL APPEARANCE:  In no acute distress.  SKIN:  Skin is warm and dry.  MOUTH and THROAT: Lips are without lesions. Tongue is normal in shape, size, and color and without lesions RESPIRATORY: Breathing is even & unlabored, BS CTAB CARDIAC: Tachycardic, no murmur,no extra heart sounds, no edema GI: Abdomen soft, normal BS, no masses, no tenderness EXTREMITIES:  Able to move X 4 extremities NEUROLOGICAL: +tremor. Speech is clear. Alert and oriented X 3. PSYCHIATRIC:  Affect and behavior are appropriate  Labs reviewed: 09/17/18   glucose 138 calcium 8.3 BUN 25.6 creatinine 1.26 sodium 136 potassium 4.6 total bilirubin 0.3 total protein 6.4 Albumin 3.4 ALT 18 AST 22 151 eGFR 70.78   WBC 1.3 hemoglobin 13.6 hematocrit 40.2 MCV 95.8 platelet 175 MO% 21.2  LY% 11.3  EO% 0.4  09/13/18   glucose 93 calcium 8.3 BUN 21.0 creatinine 1.08 sodium 137 potassium 4.7 total bili 0.3 total protein 6.3 Albumin 3.7 ALT 28 AST 26 alk phos 193 eGFR 85.29 WBC 5.6 hemoglobin 13.2 hematocrit 40.0 MCV 98.4 platelet 215 LY% 9.0  MO% 29.6  EO% 0.0  Recent Labs    03/07/18 1518 08/25/18 0208  NA 137 139  K 5.4* 3.8  CL 94* 108  CO2 34* 24  GLUCOSE 107* 98  BUN 17 16  CREATININE 7.19* 1.21  CALCIUM 8.9 8.6*   Recent Labs    03/07/18 1518  AST 26  ALT 15  BILITOT 0.5  PROT 8.6*   Recent Labs    03/07/18 1518 08/25/18 0208  WBC 9.3 4.8  NEUTROABS  --  3.0  HGB 14.4 14.1  HCT 41.3 46.1  MCV 94.5 104.3*  PLT 237  185   Lab Results  Component Value Date   TSH 0.581 01/24/2017    Lab Results  Component Value Date   CHOL 185 07/18/2017   HDL 37 (L) 07/18/2017   LDLCALC 120 (H) 07/18/2017   LDLDIRECT 65 07/24/2006   TRIG 158 (H) 07/18/2017   CHOLHDL 5.0 (H) 07/18/2017    Significant Diagnostic Results in last 30 days:  Ct Pelvis Wo Contrast  Result Date: 08/25/2018 CLINICAL DATA:  Left posterior hip pain status post trauma EXAM: CT PELVIS  WITHOUT CONTRAST TECHNIQUE: Multidetector CT imaging of the pelvis was performed following the standard protocol without intravenous contrast. COMPARISON:  08/17/2018 FINDINGS: Urinary Tract: There is mild asymmetric bladder wall thickening. 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 Bowel: There is a large amount of stool in the colon. The appendix is not reliably identified. Vascular/Lymphatic: No pathologically enlarged lymph nodes. No significant vascular abnormality seen. Reproductive:  The prostate gland is significantly enlarged. Other:  None. Musculoskeletal: There are new acute minimally displaced bilateral sacral ala fractures. There is a compression fracture of the S2 vertebral body. There is diffuse osteopenia. IMPRESSION: 1. New acute minimally displaced fractures involving the bilateral sacral ala. 2. New compression fracture of the S2 vertebral body. 3. Diffuse osteopenia. 4. Slightly asymmetric bladder wall thickening which is nonspecific. Correlation with urinalysis is recommended. Outpatient urology follow-up is recommended. There is diffuse prostatomegaly. Electronically Signed   By: Constance Holster M.D.   On: 08/25/2018 01:41   Dg Hand Complete Left  Result Date: 08/24/2018 CLINICAL DATA:  Recent trip and fall with thumb deformity, initial encounter EXAM: LEFT HAND - COMPLETE 3+ VIEW COMPARISON:  None. FINDINGS: There is dislocation at the first MCP joint with lateral displacement of the proximal phalanx with  respect to the metacarpal. No acute fracture is seen. No soft tissue abnormality is noted. IMPRESSION: Dislocation at the first MCP joint. Electronically Signed   By: [name] M.D.   On: 08/24/2018 22:16   Dg Finger Thumb Left  Result Date: 08/24/2018 CLINICAL DATA:  Post reduction radiographs EXAM: LEFT THUMB 2+V COMPARISON:  08/24/2018 FINDINGS: The patient is status post reduction at the first metacarpophalangeal joint. The alignment appears significantly improved. There may be some mild palmar subluxation. There is no evidence of a displaced fracture. There is surrounding soft tissue swelling. IMPRESSION: Improved alignment status post reduction. No evidence of a displaced fracture. Electronically Signed   By: Constance Holster M.D.   On: 08/24/2018 23:28   Dg Hip Unilat W Or Wo Pelvis 2-3 Views Left  Result Date: 08/24/2018 CLINICAL DATA:  Recent fall with left hip pain, initial encounter EXAM: DG HIP (WITH OR WITHOUT PELVIS) 3V LEFT COMPARISON:  08/17/2018 FINDINGS: There is no evidence of hip fracture or dislocation. There is no evidence of arthropathy or other focal bone abnormality. IMPRESSION: No acute abnormality noted. Electronically Signed   By: [name] M.D.   On: 08/24/2018 22:17    Assessment/Plan  1. Tachycardia - HR 115, 119, 120, will start on Metoprolol tartrate 25 mg 1/2 tab = 12.5 mg BID, hold for SBP < 105  2. Esophageal dysphagia - follows up with [name] and for EGD on 6/17, aspiration precautions, ST to do swallow evaluation  3. Fever - currently on Ceftriaxone 1 gm IM Q D for a total of 5 days - no fever today, wbc 1.3, NE 0.9 LY#0.1   Family/ staff Communication: Discussed plan of care with resident.  Labs/tests ordered: TSH  Goals of care:   Short-term rehabilitation.   Durenda Age, DNP, FNP-BC [facility] [phone] (Monday-Friday 8:00 a.m. - 5:00 p.m.) [phone] (after hours)

## 2018-09-19 ENCOUNTER — Encounter: Payer: Self-pay | Admitting: Internal Medicine

## 2018-09-19 ENCOUNTER — Non-Acute Institutional Stay (SKILLED_NURSING_FACILITY): Payer: Medicare Other | Admitting: Internal Medicine

## 2018-09-19 DIAGNOSIS — R1310 Dysphagia, unspecified: Secondary | ICD-10-CM | POA: Diagnosis not present

## 2018-09-19 DIAGNOSIS — R000 Tachycardia, unspecified: Secondary | ICD-10-CM

## 2018-09-19 DIAGNOSIS — B20 Human immunodeficiency virus [HIV] disease: Secondary | ICD-10-CM | POA: Diagnosis not present

## 2018-09-19 DIAGNOSIS — R1319 Other dysphagia: Secondary | ICD-10-CM

## 2018-09-19 DIAGNOSIS — S32110D Nondisplaced Zone I fracture of sacrum, subsequent encounter for fracture with routine healing: Secondary | ICD-10-CM | POA: Diagnosis not present

## 2018-09-19 DIAGNOSIS — Z940 Kidney transplant status: Secondary | ICD-10-CM

## 2018-09-19 NOTE — Progress Notes (Signed)
Location:  [place] Room Number: 09/21/18 Place of Service:  SNF (31)  Provider: Granville Lewis, PA-C  PCP: [name], MD Patient Care Team: [name], MD as PCP - General [name] [name], MD as PCP - Infectious Diseases (Infectious Diseases) Corliss Parish, MD as Consulting Physician (Nephrology) Malachy Mood (Transplant)  Extended Emergency Contact Information Primary Emergency Contact: [place] Mobile Phone: [phone] Relation: Significant other Secondary Emergency Contact: Molock,Stephanie Address: [place], [place] [address] Johnnette Litter of [place] Mobile Phone: [phone] Relation: Niece  Code Status:  Full Code Goals of care:  Advanced Directive information Advanced Directives 08/29/2018  Does Patient Have a Medical Advance Directive? Yes  Type of Advance Directive (No Data)  Does patient want to make changes to medical advance directive? No - Patient declined  Copy of [place] in Chart? No - copy requested  Would patient like information on creating a medical advance directive? No - Patient declined     No Known Allergies  Chief Complaint  Patient presents with   Discharge Note    Patient is seen for discharge.    HPI:  The patient is a 63 y.o. male seen today for discharge from facility tomorrow.  Patient is a short-term rehabilitation patient-he has a complicated medical history including lupus nephritis HIV DVT end-stage renal disease status post renal transplant as well as GERD hyperlipidemia and hypertension.  He was here after sustaining a fall at home and sustained a sacral fracture he is weightbearing as tolerated and did receive PT and OT he is gained some strength but still has weakness and will need continued therapy.  He also last week developed a fever of unknown origin-blood work was reassuring as well as his urine and a chest  x-ray.  He did spike another elevated temperature over the weekend and he was started empirically on Rocephin he is completing a 5-day course today.  Currently he is not febrile and continues to deny any fever chills cough shortness of breath.  He has been COVID negative.  He is also had complaints of dysphasia and actually was seen at [facility] by gastroenterology yesterday.  He had an endoscopy performed and was found to have severe granular mucosa with linear ulcerations in the mid and distal esophagus there was also a nodule submucosal found in the stomach this was biopsied there are suspicions of CMV given his history of HIV and post transplant status he was also found to have absent contractibility of the esophageal body.  Recommendations were to wait for pathology results and it was thought he would benefit from a PPI and Carafate which apparently have been prescribed in sent already to his pharmacy which she will pick up tomorrow also will have follow-up with an EGD in approximately 6 to 8 weeks after therapy for the esophageal ulceration.  Originally he was slated for discharge earlier this week but he was agreeable to staying so he complete his antibiotic and we could monitor any further temperature elevations but this appears to have stabilized.  In regards to his other issues he also had tachycardia earlier this week and was started on low-dose Lopressor this appears to have moderated he does not complain of any chest pain shortness of breath.      In regards to history of renal transplant he is followed by the renal transplant team at [facility]- he continues  on mycophenolate--as well as hydroxychloroquine  and tacrolimus  For pain management he does continue on Norco as needed.  He also continues on Bactrim for prophylaxis he is on this 3 days a week.  Currently he is resting in bed comfortably still appears to be quite weak but he is quite insistent on going home- he will have PT and  OT as well as home health support as well as nursing to help with medical management of his issues.  He also says he will have a friend stop by.      Past Medical History:  Diagnosis Date   Achalasia    Candida infection, esophageal ([place]) 08/2010   a. 2012 - noted on EGD.   DVT (deep venous thrombosis) ([place]) ~ 04/2014   ?RLE   Dysphagia 2008   post heller myotomy/toupee fundoplication.     ESRD (end stage renal disease) on dialysis [facility])    "MWF: [name]" (01/22/2017)   Fall 01/22/2017   mechanical fall w/multiple fractures/notes 01/22/2017   GERD (gastroesophageal reflux disease)    hx (01/22/2017)   Hemorrhoids, internal    History of blood transfusion 03/2014   "low counts"   History of gout    History of stomach ulcers    HIV infection ([place]) dx ~ 2009   a. 02/13/2014 CD4 = 240 - undetectable viral load.   Hyperlipidemia    Hypertension    hx (01/22/2017)   Insomnia    Lupus nephritis (HCC)    Pancytopenia (HCC)    Premature ventricular contractions    Renal abscess    SLE (systemic lupus erythematosus) ([place]) dx'd 2015    Past Surgical History:  Procedure Laterality Date   BASCILIC VEIN TRANSPOSITION Left 06/29/2014   Procedure: FIRST STAGE  [place];  Surgeon: Rosetta Posner, MD;  Location: [place];  Service: Vascular;  Laterality: Left;   [place] Left 09/11/2014   Procedure: SECOND STAGE BASILIC VEIN TRANSPOSITION LEFT UPPER ARM;  Surgeon: Rosetta Posner, MD;  Location: [place];  Service: Vascular;  Laterality: Left;   COLONOSCOPY  2008   .  tortuous colon, internal hemorrhoids.  no polyps or diverticulosis.    ESOPHAGOGASTRODUODENOSCOPY  08/2010   Dr [name].  08/2010 candida esophagitis, no obvious esophageal stricture. 02/2006 and 05/2006 dilated esophagus, narrowed distal esophagus but no stricture, was empirically [place] dilated   ESOPHAGOGASTRODUODENOSCOPY N/A 06/27/2014   Procedure:  ESOPHAGOGASTRODUODENOSCOPY (EGD);  Surgeon: [name], MD;  Location: [facility] ENDOSCOPY;  Service: Endoscopy;  Laterality: N/A;   ESOPHAGOGASTRODUODENOSCOPY N/A 07/02/2014   Procedure: ESOPHAGOGASTRODUODENOSCOPY (EGD);  Surgeon: [name], MD;  Location: [facility] ENDOSCOPY;  Service: Endoscopy;  Laterality: N/A;  with botox   [place]   with toupee fundoplication of HH.    INSERTION OF DIALYSIS CATHETER Right 06/29/2014   Procedure: INSERTION OF DIALYSIS CATHETER RIGHT IJ;  Surgeon: Rosetta Posner, MD;  Location: [place];  Service: Vascular;  Laterality: Right;   KIDNEY TRANSPLANT     January 2020 @ [place] Right ~ 2011   ORIF TIBIA PLATEAU Right 01/23/2017   Procedure: OPEN REDUCTION INTERNAL FIXATION (ORIF) TIBIAL PLATEAU; REPAIR OF LATERAL TIBIAL PLATEAU; ARTHROTOMY WITH MENISCUS REPAIR; ANTERIOR COMPARTMENT FASCIOTOMY;  Surgeon: Altamese Yalaha, MD;  Location: [place];  Service: Orthopedics;  Laterality: Right;      reports that he has never smoked. He has never used smokeless tobacco. He reports that he does not drink alcohol or use drugs. Social  History   Socioeconomic History   Marital status: Single    Spouse name: Not on file   Number of children: Not on file   Years of education: Not on file   Highest education level: Not on file  Occupational History   Not on file  Social Needs   Financial resource strain: Not on file   Food insecurity    Worry: Not on file    Inability: Not on file   Transportation needs    Medical: Not on file    Non-medical: Not on file  Tobacco Use   Smoking status: Never Smoker   Smokeless tobacco: Never Used  Substance and Sexual Activity   Alcohol use: No   Drug use: No   Sexual activity: Not Currently    Partners: Male  Lifestyle   Physical activity    Days per week: Not on file    Minutes per session: Not on file   Stress: Not on file  Relationships   Social connections    Talks on phone: Not on file     Gets together: Not on file    Attends religious service: Not on file    Active member of club or organization: Not on file    Attends meetings of clubs or organizations: Not on file    Relationship status: Not on file   Intimate partner violence    Fear of current or ex partner: Not on file    Emotionally abused: Not on file    Physically abused: Not on file    Forced sexual activity: Not on file  Other Topics Concern   Not on file  Social History Narrative   Lives in [place] by himself. Does not routinely exercise.   Functional Status Survey:    No Known Allergies  Pertinent  Health Maintenance Due  Topic Date Due   COLONOSCOPY  09/29/2018 (Originally 06/01/2016)   INFLUENZA VACCINE  11/02/2018    Medications: Outpatient Encounter Medications as of 09/19/2018  Medication Sig   abacavir-dolutegravir-lamiVUDine (TRIUMEQ) 600-50-300 MG tablet Take 1 tablet by mouth daily.   amitriptyline (ELAVIL) 10 MG tablet Take 1 tablet (10 mg total) by mouth at bedtime.   aspirin 81 MG chewable tablet Chew 81 mg by mouth daily.   calcitRIOL (ROCALTROL) 0.25 MCG capsule Take 0.25 mcg by mouth every Monday, Wednesday, and Friday. FOR ESRD WITH TRANSPLANT (DO NOT CRUSH)   Cholecalciferol 50 MCG (2000 UT) TABS Take 2,000 Units by mouth daily.    HYDROcodone-acetaminophen (NORCO) 7.5-325 MG tablet Take 1 tablet by mouth every 6 (six) hours as needed for moderate pain.   hydroxychloroquine (PLAQUENIL) 200 MG tablet Take 1 tablet (200 mg total) by mouth 2 (two) times daily.   metoprolol tartrate (LOPRESSOR) 25 MG tablet Take 12.5 mg by mouth 2 (two) times daily. Hold for SBP <105   mycophenolate (CELLCEPT) 250 MG capsule Take 250 mg by mouth every 12 (twelve) hours.    Nutritional Supplement LIQD Take 120 mLs by mouth 2 (two) times a day. Med[employer]   predniSONE (DELTASONE) 10 MG tablet Take 1 tablet (10 mg total) by mouth daily.   saccharomyces boulardii (FLORASTOR) 250 MG capsule Take 250  mg by mouth daily. x14 days   sulfamethoxazole-trimethoprim (BACTRIM DS) 800-160 MG tablet Take 1 tablet by mouth every Monday, Wednesday, and Friday.   tacrolimus (PROGRAF) 0.5 MG capsule Take 1.5 mg by mouth 2 (two) times daily.    tamsulosin (FLOMAX) 0.4 MG CAPS capsule Take 0.04  mg by mouth daily.   [DISCONTINUED] aspirin EC 81 MG tablet Take 81 mg by mouth daily.   [DISCONTINUED] cefTRIAXone (ROCEPHIN) 1 g injection Inject 1 g into the muscle daily.   No facility-administered encounter medications on file as of 09/19/2018.     Review of Systems   General is not complaining any fever chills currently afebrile again did have an elevated temperature over the weekend.  Skin does not complain of rashes itching or diaphoresis.  Head ears eyes nose mouth and throat is not complaining of visual changes or sore throat.  Respiratory is not complaining of shortness of breath or cough.  Cardiac denies chest pain does not have edema.  GI is not complaining of abdominal discomfort nausea vomiting diarrhea constipation.  GU does have a history of renal transplant is not complaining of dysuria.  GI does not complain of abdominal pain nausea or vomiting or constipation does have a history of dysphasia as noted he has been seen by gastroenterology at [facility].  Musculoskeletal continues with weakness but does not complain specifically of joint pain today he does have a sacral fracture.  Neurologic does not complain of dizziness headache or numbness he does have weakness.  Psych does not complain of overt depression or anxiety-I did see him earlier in his stay for apparently a statement that indicated possible depression but he adamantly denied it at that time and continues to deny nursing has not reported any significant depressive symptoms or comments.   Vitals:   09/19/18 1449  BP: 132/86  Pulse: 89  Resp: 20  Temp: 98.2 F (36.8 C)  TempSrc: Oral  SpO2: 99%  Weight: 117 lb 12.8 oz  (53.4 kg)  Height: 5\' 11"  (1.803 m)   Body mass index is 16.43 kg/m. Physical Exam   In general this is a very pleasant middle-age male in no distress lying comfortably in bed.  His skin is warm and dry.  Eyes visual acuity appears to be intact sclera and conjunctive are clear.  Oropharynx is clear mucous membranes moist.  I do note on his lip lower left side there is a small ulcer.  Chest is clear to auscultation with somewhat shallow air entry there is no labored breathing.  Heart is regular rate and rhythm without murmur gallop or rub.  Abdomen is soft does not appear to be tender there are positive bowel sounds.  Musculoskeletal does have diffuse but is able to move all extremities x4 he has worked with therapy- he is ambulating with a walker but still has fairly significant weakness.  Neurologic is grossly intact his speech is clear could not really appreciate lateralizing findings.  Psych he is alert and he is oriented pleasant and appropriate   Labs reviewed: Basic Metabolic Panel: Recent Labs    03/07/18 1518 08/25/18 0208 09/04/18 09/13/18 09/17/18  NA 137 139 140 137 136*  K 5.4* 3.8 4.3 4.7 4.6  CL 94* 108  --   --   --   CO2 34* 24  --   --   --   GLUCOSE 107* 98  --   --   --   BUN 17 16 16 21  26*  CREATININE 7.19* 1.21 1.1 1.1 1.3  CALCIUM 8.9 8.6*  --   --   --    Liver Function Tests: Recent Labs    03/07/18 1518 09/04/18 09/13/18 09/17/18  AST 26 16 26 22   ALT 15 12 28 18   ALKPHOS  --  224* 193*  151*  BILITOT 0.5  --   --   --   PROT 8.6*  --   --   --    No results for input(s): LIPASE, AMYLASE in the last 8760 hours. No results for input(s): AMMONIA in the last 8760 hours. CBC: Recent Labs    03/07/18 1518 08/25/18 0208 09/04/18 09/13/18 09/17/18  WBC 9.3 4.8 3.4 5.6 1.3  NEUTROABS  --  3.0 2 4 1   HGB 14.4 14.1 14.4 13.2* 13.6  HCT 41.3 46.1 44 40* 40*  MCV 94.5 104.3*  --   --   --   PLT 237 185 292 215 175   Cardiac  Enzymes: No results for input(s): CKTOTAL, CKMB, CKMBINDEX, TROPONINI in the last 8760 hours. BNP: Invalid input(s): POCBNP CBG: No results for input(s): GLUCAP in the last 8760 hours.  Procedures and Imaging Studies During Stay: Ct Pelvis Wo Contrast  Result Date: 08/25/2018 CLINICAL DATA:  Left posterior hip pain status post trauma EXAM: CT PELVIS WITHOUT CONTRAST TECHNIQUE: Multidetector CT imaging of the pelvis was performed following the standard protocol without intravenous contrast. COMPARISON:  08/17/2018 FINDINGS: Urinary Tract: There is mild asymmetric bladder wall thickening. 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 Bowel: There is a large amount of stool in the colon. The appendix is not reliably identified. Vascular/Lymphatic: No pathologically enlarged lymph nodes. No significant vascular abnormality seen. Reproductive:  The prostate gland is significantly enlarged. Other:  None. Musculoskeletal: There are new acute minimally displaced bilateral sacral ala fractures. There is a compression fracture of the S2 vertebral body. There is diffuse osteopenia. IMPRESSION: 1. New acute minimally displaced fractures involving the bilateral sacral ala. 2. New compression fracture of the S2 vertebral body. 3. Diffuse osteopenia. 4. Slightly asymmetric bladder wall thickening which is nonspecific. Correlation with urinalysis is recommended. Outpatient urology follow-up is recommended. There is diffuse prostatomegaly. Electronically Signed   By: Constance Holster M.D.   On: 08/25/2018 01:41   Dg Hand Complete Left  Result Date: 08/24/2018 CLINICAL DATA:  Recent trip and fall with thumb deformity, initial encounter EXAM: LEFT HAND - COMPLETE 3+ VIEW COMPARISON:  None. FINDINGS: There is dislocation at the first MCP joint with lateral displacement of the proximal phalanx with respect to the metacarpal. No acute fracture is seen. No soft tissue abnormality is noted.  IMPRESSION: Dislocation at the first MCP joint. Electronically Signed   By: [name] M.D.   On: 08/24/2018 22:16   Dg Finger Thumb Left  Result Date: 08/24/2018 CLINICAL DATA:  Post reduction radiographs EXAM: LEFT THUMB 2+V COMPARISON:  08/24/2018 FINDINGS: The patient is status post reduction at the first metacarpophalangeal joint. The alignment appears significantly improved. There may be some mild palmar subluxation. There is no evidence of a displaced fracture. There is surrounding soft tissue swelling. IMPRESSION: Improved alignment status post reduction. No evidence of a displaced fracture. Electronically Signed   By: Constance Holster M.D.   On: 08/24/2018 23:28   Dg Hip Unilat W Or Wo Pelvis 2-3 Views Left  Result Date: 08/24/2018 CLINICAL DATA:  Recent fall with left hip pain, initial encounter EXAM: DG HIP (WITH OR WITHOUT PELVIS) 3V LEFT COMPARISON:  08/17/2018 FINDINGS: There is no evidence of hip fracture or dislocation. There is no evidence of arthropathy or other focal bone abnormality. IMPRESSION: No acute abnormality noted. Electronically Signed   By: [name] M.D.   On: 08/24/2018 22:17    Assessment/Plan:   #1- history  of sacral fracture-again he will need continued PT and OT as well as home health support-he does receive Norco as needed for pain we gave him an limited supply with follow-up by primary care provider- he continues to have significant weakness I did speak with therapy who feel he definitely needs some continued help- but do understand his need and desire to get home which he has been quite adamant about.  2.-  History of achalasia-GERD as noted above he has been seen at [facility] and had an upper GI endoscopy performed- findings as noted above there are suspicions he has significant esophageal erosions with suspicions of CMV- he has been prescribed Carafate and a PPI which he will pick up at his outpatient pharmacy and will have follow-up by [name]  gastroenterology.-Nodule was biopsied results are pending  3.  History of renal transplant he is followed by the renal transplant team at [facility]- he continues on mycophenolate as well as Plaquenil and tacrolimus.  As well as prednisone  4.-History of HIV he is followed by infectious disease at [facility] as well as at [facility]- he continues onTriumeq  As well as prophylactic Bactrim 3 times a week.  5.-  Fever of unknown origin he has been afebrile now it appears for several days he continues to deny any cough shortness of breath diarrhea fever chills.  - We did expressed desires to possibly have him stay a few more days but he has been quite adamant that he feels good enough to go home- he is aware that if this changes he needs to seek medical treatment expediently.  6.-  History of asymmetric bladder wall thickening he will need urology follow-up as an outpatient- this was an incidental finding on CT of the pelvis done in the hospital.  #7 tachycardia he has been started on low-dose Lopressor he appears to be tolerating this well pulse is in the 80s on exam today  Again patient will need expedient follow-up by his numerous specialists including renal transplant team infectious disease at [facility] as well as phone-.  He also will have follow-up at [facility] gastroenterology.  He also will need extensive home health support as well as PT and OT and nursing to help with medical management of his numerous medical issues.       Patient has been advised to f/u with their PCP in 1-2 weeks to for a transitions of care visit.  Social services at their facility was responsible for arranging this appointment.  Pt was provided with adequate prescriptions of noncontrolled medications to reach the scheduled appointment .  For controlled substances, a limited supply was provided as appropriate for the individual patient.  If the pt normally receives these medications from a pain clinic or has a contract with another  physician, these medications should be received from that clinic or physician only).    [id number] note  greater than 30-minutes spent on this discharge summary- greater than 50% of time spent coordinating a plan of care for numerous diagnoses

## 2018-09-20 ENCOUNTER — Non-Acute Institutional Stay (SKILLED_NURSING_FACILITY): Payer: Medicare Other | Admitting: Adult Health

## 2018-09-20 ENCOUNTER — Encounter: Payer: Self-pay | Admitting: Adult Health

## 2018-09-20 DIAGNOSIS — R1013 Epigastric pain: Secondary | ICD-10-CM | POA: Diagnosis not present

## 2018-09-20 DIAGNOSIS — K2210 Ulcer of esophagus without bleeding: Secondary | ICD-10-CM

## 2018-09-20 NOTE — Progress Notes (Signed)
Location:  [place] Room Number: 423-N Place of Service:  SNF (31) Provider:  Durenda Age, DNP, FNP-BC  Patient Care Team: [name], MD as PCP - General [name] [name], MD as PCP - Infectious Diseases (Infectious Diseases) Corliss Parish, MD as Consulting Physician (Nephrology) Malachy Mood (Transplant)  Extended Emergency Contact Information Primary Emergency Contact: [place] Mobile Phone: [phone] Relation: Significant other Secondary Emergency Contact: Heyden,Stephanie Address: [place], [place] [address] Johnnette Litter of [place] Mobile Phone: [phone] Relation: Niece  Code Status:  Full Code  Goals of care: Advanced Directive information Advanced Directives 08/29/2018  Does Patient Have a Medical Advance Directive? Yes  Type of Advance Directive (No Data)  Does patient want to make changes to medical advance directive? No - Patient declined  Copy of [place] in Chart? No - copy requested  Would patient like information on creating a medical advance directive? No - Patient declined     Chief Complaint  Patient presents with   Acute Visit    Patient with complaint of abdominal pain.    HPI:  Pt is a 63 y.o. male seen today for an acute visit secondary to complaints of abdominal pain. He was admitted to [facility] for short-term rehabilitation and plan is to discharge him home today. He has a PMH of HIV, DVT, ESRD, GERD, lupus nephritis, HLD, and HTN. He was seen holding the top of his stomach. He is supposed to be discharged today. He had EGD yesterday at [facility] due to dysphagia. Result showed ulcerations in the mid and distal esophagus and was biopsied. He showed NP an e-mail stating that Sucralfate prescription was sent [facility] his [facility]. He was crying and advised him to stay a week more so his  midabdominal pain can be stabilized. He agreed to stay for a week.    Past Medical History:  Diagnosis Date   Achalasia    Candida infection, esophageal ([place]) 08/2010   a. 2012 - noted on EGD.   DVT (deep venous thrombosis) ([place]) ~ 04/2014   ?RLE   Dysphagia 2008   post heller myotomy/toupee fundoplication.     ESRD (end stage renal disease) on dialysis [facility])    "MWF: [name]" (01/22/2017)   Fall 01/22/2017   mechanical fall w/multiple fractures/notes 01/22/2017   GERD (gastroesophageal reflux disease)    hx (01/22/2017)   Hemorrhoids, internal    History of blood transfusion 03/2014   "low counts"   History of gout    History of stomach ulcers    HIV infection ([place]) dx ~ 2009   a. 02/13/2014 CD4 = 240 - undetectable viral load.   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  tortuous colon, internal hemorrhoids.  no polyps or diverticulosis.    ESOPHAGOGASTRODUODENOSCOPY  08/2010   Dr [name].  08/2010 candida esophagitis, no obvious esophageal stricture. 02/2006 and 05/2006 dilated esophagus, narrowed distal esophagus but no stricture, was empirically [place] dilated   ESOPHAGOGASTRODUODENOSCOPY N/A 06/27/2014   Procedure: ESOPHAGOGASTRODUODENOSCOPY (EGD);  Surgeon: [name], MD;  Location[facility] ENDOSCOPY;  Service: Endoscopy;   Laterality: N/A;   ESOPHAGOGASTRODUODENOSCOPY N/A 07/02/2014   Procedure: ESOPHAGOGASTRODUODENOSCOPY (EGD);  Surgeon: [name], MD;  Location: [facility] ENDOSCOPY;  Service: Endoscopy;  Laterality: N/A;  with botox   [place]   with toupee fundoplication of HH.    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   amitriptyline (ELAVIL) 10 MG tablet Take 1 tablet (10 mg total) by mouth at bedtime.   aspirin 81 MG chewable tablet Chew 81 mg by mouth daily.   calcitRIOL (ROCALTROL) 0.25 MCG capsule Take 0.25 mcg by mouth every Monday, Wednesday, and Friday. FOR ESRD WITH TRANSPLANT (DO NOT CRUSH)   Cholecalciferol 50 MCG (2000 UT) TABS Take 2,000 Units by mouth daily.    HYDROcodone-acetaminophen (NORCO) 7.5-325 MG tablet Take 1 tablet by mouth every 6 (six) hours as needed for moderate pain.   hydroxychloroquine (PLAQUENIL) 200 MG tablet Take 1 tablet (200 mg total) by mouth 2 (two) times daily.   metoprolol tartrate (LOPRESSOR) 25 MG tablet Take 12.5 mg by mouth 2 (two) times daily. Hold for SBP <105   mycophenolate (CELLCEPT) 250 MG capsule Take 250 mg by mouth every 12 (twelve) hours.    Nutritional Supplement LIQD Take 120 mLs by mouth 2 (two) times a day. Med[employer]   predniSONE  (DELTASONE) 10 MG tablet Take 1 tablet (10 mg total) by mouth daily.   saccharomyces boulardii (FLORASTOR) 250 MG capsule Take 250 mg by mouth daily.    sulfamethoxazole-trimethoprim (BACTRIM DS) 800-160 MG tablet Take 1 tablet by mouth every Monday, Wednesday, and Friday.   tacrolimus (PROGRAF) 0.5 MG capsule Take 1.5 mg by mouth 2 (two) times daily.    tamsulosin (FLOMAX) 0.4 MG CAPS capsule Take 0.04 mg by mouth daily.   No facility-administered encounter medications on file as of 09/20/2018.     Review of Systems  GENERAL: No fever or chills  MOUTH and THROAT: Denies oral discomfort, gingival pain or bleeding RESPIRATORY: no cough, SOB, DOE, wheezing, hemoptysis CARDIAC: No chest pain, edema or palpitations GI:+abdominal pain GU: Denies dysuria, frequency, hematuria, incontinence, or discharge NEUROLOGICAL: Denies dizziness, syncope, numbness, or headache PSYCHIATRIC: Denies feelings of depression or anxiety. No report of hallucinations, insomnia, paranoia, or agitation   Immunization History  Administered Date(s) Administered   DTaP / Hep B / IPV 08/31/2006, 10/02/2006, 02/19/2007   Hepatitis A, Adult 08/23/2017, 03/21/2018   Hepatitis B 08/31/2006, 10/02/2006, 02/19/2007  Hepatitis B, adult 08/23/2017, 08/23/2017, 03/21/2018, 03/21/2018, 04/23/2018, 04/23/2018   Influenza Whole 01/22/2008   Influenza, Seasonal, Injecte, Preservative Fre 04/24/2016, 01/05/2017   Influenza-Unspecified 02/02/2016, 01/15/2017, 01/01/2018   Pneumococcal Conjugate-13 02/27/2017   Pneumococcal Polysaccharide-23 08/31/2006, 07/10/2017   Td 11/01/2004   Tdap 07/10/2017   Pertinent  Health Maintenance Due  Topic Date Due   COLONOSCOPY  09/29/2018 (Originally 06/01/2016)   INFLUENZA VACCINE  11/02/2018   Fall Risk  08/22/2018 03/21/2018 08/23/2017 03/01/2017 09/16/2015  Falls in the past year? 1 0 Yes Yes No  Number falls in past yr: 0 - 1 1 -  Injury with Fall? 1 - Yes Yes -      Vitals:   09/20/18 1352  BP: (!) 118/58  Pulse: 81  Resp: 19  Temp: 98.9 F (37.2 C)  TempSrc: Oral  SpO2: 99%  Weight: 117 lb 12.8 oz (53.4 kg)  Height: 5\' 11"  (1.803 m)   Body mass index is 16.43 kg/m.  Physical Exam  GENERAL APPEARANCE:  In no acute distress.  SKIN:  Skin is warm and dry.  MOUTH and THROAT: Lips are without lesions. Oral mucosa is moist and without lesions.  RESPIRATORY: Breathing is even & unlabored, BS CTAB CARDIAC: RRR, no murmur,no extra heart sounds, no edema GI: Abdomen soft, normal BS, no masses, no tenderness EXTREMITIES:  Able to move X 4 extremities NEUROLOGICAL: There is no tremor. Speech is clear. Alert and oriented X 3. PSYCHIATRIC:  Affect and behavior are appropriate  Labs reviewed: Recent Labs    03/07/18 1518 08/25/18 0208 09/04/18 09/13/18 09/17/18  NA 137 139 140 137 136*  K 5.4* 3.8 4.3 4.7 4.6  CL 94* 108  --   --   --   CO2 34* 24  --   --   --   GLUCOSE 107* 98  --   --   --   BUN 17 16 16 21  26*  CREATININE 7.19* 1.21 1.1 1.1 1.3  CALCIUM 8.9 8.6*  --   --   --    Recent Labs    03/07/18 1518 09/04/18 09/13/18 09/17/18  AST 26 16 26 22   ALT 15 12 28 18   ALKPHOS  --  224* 193* 151*  BILITOT 0.5  --   --   --   PROT 8.6*  --   --   --    Recent Labs    03/07/18 1518 08/25/18 0208 09/04/18 09/13/18 09/17/18  WBC 9.3 4.8 3.4 5.6 1.3  NEUTROABS  --  3.0 2 4 1   HGB 14.4 14.1 14.4 13.2* 13.6  HCT 41.3 46.1 44 40* 40*  MCV 94.5 104.3*  --   --   --   PLT 237 185 292 215 175   Lab Results  Component Value Date   TSH 0.581 01/24/2017    Lab Results  Component Value Date   CHOL 185 07/18/2017   HDL 37 (L) 07/18/2017   LDLCALC 120 (H) 07/18/2017   LDLDIRECT 65 07/24/2006   TRIG 158 (H) 07/18/2017   CHOLHDL 5.0 (H) 07/18/2017    Significant Diagnostic Results in last 30 days:  Ct Pelvis Wo Contrast  Result Date: 08/25/2018 CLINICAL DATA:  Left posterior hip pain status post trauma EXAM: CT PELVIS  WITHOUT CONTRAST TECHNIQUE: Multidetector CT imaging of the pelvis was performed following the standard protocol without intravenous contrast. COMPARISON:  08/17/2018 FINDINGS: Urinary Tract: There is mild asymmetric bladder wall thickening. 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 Bowel: There is a large amount of stool in the colon. The appendix is not reliably identified. Vascular/Lymphatic: No pathologically enlarged lymph nodes. No significant vascular abnormality seen. Reproductive:  The prostate gland is significantly enlarged. Other:  None. Musculoskeletal: There are new acute minimally displaced bilateral sacral ala fractures. There is a compression fracture of the S2 vertebral body. There is diffuse osteopenia. IMPRESSION: 1. New acute minimally displaced fractures involving the bilateral sacral ala. 2. New compression fracture of the S2 vertebral body. 3. Diffuse osteopenia. 4. Slightly asymmetric bladder wall thickening which is nonspecific. Correlation with urinalysis is recommended. Outpatient urology follow-up is recommended. There is diffuse prostatomegaly. Electronically Signed   By: Constance Holster M.D.   On: 08/25/2018 01:41   Dg Hand Complete Left  Result Date: 08/24/2018 CLINICAL DATA:  Recent trip and fall with thumb deformity, initial encounter EXAM: LEFT HAND - COMPLETE 3+ VIEW COMPARISON:  None. FINDINGS: There is dislocation at the first MCP joint with lateral displacement of the proximal phalanx with respect to the metacarpal. No acute fracture is seen. No soft tissue abnormality is noted. IMPRESSION: Dislocation at the first MCP joint. Electronically Signed   By: [name] M.D.   On: 08/24/2018 22:16   Dg Finger Thumb Left  Result Date: 08/24/2018 CLINICAL DATA:  Post reduction radiographs EXAM: LEFT THUMB 2+V COMPARISON:  08/24/2018 FINDINGS: The patient is status post reduction at the first metacarpophalangeal joint. The alignment appears  significantly improved. There may be some mild palmar subluxation. There is no evidence of a displaced fracture. There is surrounding soft tissue swelling. IMPRESSION: Improved alignment status post reduction. No evidence of a displaced fracture. Electronically Signed   By: Constance Holster M.D.   On: 08/24/2018 23:28   Dg Hip Unilat W Or Wo Pelvis 2-3 Views Left  Result Date: 08/24/2018 CLINICAL DATA:  Recent fall with left hip pain, initial encounter EXAM: DG HIP (WITH OR WITHOUT PELVIS) 3V LEFT COMPARISON:  08/17/2018 FINDINGS: There is no evidence of hip fracture or dislocation. There is no evidence of arthropathy or other focal bone abnormality. IMPRESSION: No acute abnormality noted. Electronically Signed   By: [name] M.D.   On: 08/24/2018 22:17    Assessment/Plan  1. Ulcer of esophagus without bleeding - will start on Sucralfate 1gm/10 ml TIDac and HS X 8 weeks, follow-up with [facility]  2. Epigastric pain - continue Norco 5-325 mg 1 tab Q 6 hours PRN   Family/ staff Communication:  Discussed plan of care with resident.  Labs/tests ordered:  None  Goals of care:   Short-term care   Durenda Age, DNP, FNP-BC [facility] [phone] (Monday-Friday 8:00 a.m. - 5:00 p.m.) [phone] (after hours)

## 2018-09-21 DIAGNOSIS — Z8719 Personal history of other diseases of the digestive system: Secondary | ICD-10-CM | POA: Insufficient documentation

## 2018-09-23 ENCOUNTER — Non-Acute Institutional Stay (SKILLED_NURSING_FACILITY): Payer: Medicare Other | Admitting: Internal Medicine

## 2018-09-23 ENCOUNTER — Encounter (HOSPITAL_COMMUNITY): Payer: Self-pay | Admitting: Emergency Medicine

## 2018-09-23 ENCOUNTER — Encounter: Payer: Self-pay | Admitting: Internal Medicine

## 2018-09-23 ENCOUNTER — Other Ambulatory Visit: Payer: Self-pay

## 2018-09-23 ENCOUNTER — Inpatient Hospital Stay (HOSPITAL_COMMUNITY)
Admission: EM | Admit: 2018-09-23 | Discharge: 2018-09-29 | DRG: 969 | Disposition: A | Discharge status: Other Acute Inpatient Hospital | Payer: Medicare Other | Source: Skilled Nursing Facility | Attending: Internal Medicine | Admitting: Internal Medicine

## 2018-09-23 ENCOUNTER — Emergency Department (HOSPITAL_COMMUNITY): Payer: Medicare Other

## 2018-09-23 DIAGNOSIS — N179 Acute kidney failure, unspecified: Secondary | ICD-10-CM | POA: Diagnosis not present

## 2018-09-23 DIAGNOSIS — Y92129 Unspecified place in nursing home as the place of occurrence of the external cause: Secondary | ICD-10-CM

## 2018-09-23 DIAGNOSIS — E43 Unspecified severe protein-calorie malnutrition: Secondary | ICD-10-CM | POA: Diagnosis present

## 2018-09-23 DIAGNOSIS — K567 Ileus, unspecified: Secondary | ICD-10-CM

## 2018-09-23 DIAGNOSIS — K633 Ulcer of intestine: Secondary | ICD-10-CM | POA: Diagnosis present

## 2018-09-23 DIAGNOSIS — I959 Hypotension, unspecified: Secondary | ICD-10-CM

## 2018-09-23 DIAGNOSIS — R000 Tachycardia, unspecified: Secondary | ICD-10-CM | POA: Diagnosis not present

## 2018-09-23 DIAGNOSIS — R4182 Altered mental status, unspecified: Secondary | ICD-10-CM

## 2018-09-23 DIAGNOSIS — M3214 Glomerular disease in systemic lupus erythematosus: Secondary | ICD-10-CM | POA: Diagnosis present

## 2018-09-23 DIAGNOSIS — S32029D Unspecified fracture of second lumbar vertebra, subsequent encounter for fracture with routine healing: Secondary | ICD-10-CM

## 2018-09-23 DIAGNOSIS — M329 Systemic lupus erythematosus, unspecified: Secondary | ICD-10-CM | POA: Diagnosis present

## 2018-09-23 DIAGNOSIS — E785 Hyperlipidemia, unspecified: Secondary | ICD-10-CM | POA: Diagnosis present

## 2018-09-23 DIAGNOSIS — Z1159 Encounter for screening for other viral diseases: Secondary | ICD-10-CM

## 2018-09-23 DIAGNOSIS — Z79899 Other long term (current) drug therapy: Secondary | ICD-10-CM

## 2018-09-23 DIAGNOSIS — N289 Disorder of kidney and ureter, unspecified: Secondary | ICD-10-CM

## 2018-09-23 DIAGNOSIS — K562 Volvulus: Secondary | ICD-10-CM | POA: Diagnosis present

## 2018-09-23 DIAGNOSIS — E872 Acidosis: Secondary | ICD-10-CM | POA: Diagnosis present

## 2018-09-23 DIAGNOSIS — Z8249 Family history of ischemic heart disease and other diseases of the circulatory system: Secondary | ICD-10-CM

## 2018-09-23 DIAGNOSIS — A414 Sepsis due to anaerobes: Principal | ICD-10-CM | POA: Diagnosis present

## 2018-09-23 DIAGNOSIS — A0472 Enterocolitis due to Clostridium difficile, not specified as recurrent: Secondary | ICD-10-CM | POA: Diagnosis not present

## 2018-09-23 DIAGNOSIS — B20 Human immunodeficiency virus [HIV] disease: Secondary | ICD-10-CM | POA: Diagnosis present

## 2018-09-23 DIAGNOSIS — K219 Gastro-esophageal reflux disease without esophagitis: Secondary | ICD-10-CM | POA: Diagnosis present

## 2018-09-23 DIAGNOSIS — D72819 Decreased white blood cell count, unspecified: Secondary | ICD-10-CM | POA: Diagnosis present

## 2018-09-23 DIAGNOSIS — Z7982 Long term (current) use of aspirin: Secondary | ICD-10-CM

## 2018-09-23 DIAGNOSIS — B3781 Candidal esophagitis: Secondary | ICD-10-CM | POA: Diagnosis present

## 2018-09-23 DIAGNOSIS — Z86718 Personal history of other venous thrombosis and embolism: Secondary | ICD-10-CM

## 2018-09-23 DIAGNOSIS — S32110D Nondisplaced Zone I fracture of sacrum, subsequent encounter for fracture with routine healing: Secondary | ICD-10-CM

## 2018-09-23 DIAGNOSIS — E876 Hypokalemia: Secondary | ICD-10-CM | POA: Diagnosis not present

## 2018-09-23 DIAGNOSIS — S32502D Unspecified fracture of left pubis, subsequent encounter for fracture with routine healing: Secondary | ICD-10-CM

## 2018-09-23 DIAGNOSIS — Z681 Body mass index (BMI) 19 or less, adult: Secondary | ICD-10-CM

## 2018-09-23 DIAGNOSIS — K121 Other forms of stomatitis: Secondary | ICD-10-CM | POA: Diagnosis present

## 2018-09-23 DIAGNOSIS — K648 Other hemorrhoids: Secondary | ICD-10-CM | POA: Diagnosis present

## 2018-09-23 DIAGNOSIS — R109 Unspecified abdominal pain: Secondary | ICD-10-CM

## 2018-09-23 DIAGNOSIS — R571 Hypovolemic shock: Secondary | ICD-10-CM | POA: Diagnosis present

## 2018-09-23 DIAGNOSIS — Z940 Kidney transplant status: Secondary | ICD-10-CM

## 2018-09-23 DIAGNOSIS — W19XXXA Unspecified fall, initial encounter: Secondary | ICD-10-CM | POA: Diagnosis present

## 2018-09-23 DIAGNOSIS — R64 Cachexia: Secondary | ICD-10-CM | POA: Diagnosis present

## 2018-09-23 DIAGNOSIS — A419 Sepsis, unspecified organism: Secondary | ICD-10-CM

## 2018-09-23 DIAGNOSIS — R6520 Severe sepsis without septic shock: Secondary | ICD-10-CM | POA: Diagnosis present

## 2018-09-23 DIAGNOSIS — K2210 Ulcer of esophagus without bleeding: Secondary | ICD-10-CM | POA: Diagnosis present

## 2018-09-23 DIAGNOSIS — G9341 Metabolic encephalopathy: Secondary | ICD-10-CM | POA: Diagnosis not present

## 2018-09-23 DIAGNOSIS — M109 Gout, unspecified: Secondary | ICD-10-CM | POA: Diagnosis present

## 2018-09-23 DIAGNOSIS — S72111D Displaced fracture of greater trochanter of right femur, subsequent encounter for closed fracture with routine healing: Secondary | ICD-10-CM

## 2018-09-23 DIAGNOSIS — Z7952 Long term (current) use of systemic steroids: Secondary | ICD-10-CM

## 2018-09-23 DIAGNOSIS — N170 Acute kidney failure with tubular necrosis: Secondary | ICD-10-CM | POA: Diagnosis present

## 2018-09-23 DIAGNOSIS — D6959 Other secondary thrombocytopenia: Secondary | ICD-10-CM | POA: Diagnosis present

## 2018-09-23 DIAGNOSIS — B370 Candidal stomatitis: Secondary | ICD-10-CM | POA: Diagnosis present

## 2018-09-23 DIAGNOSIS — Z800 Family history of malignant neoplasm of digestive organs: Secondary | ICD-10-CM

## 2018-09-23 LAB — URINALYSIS, ROUTINE W REFLEX MICROSCOPIC
Glucose, UA: NEGATIVE mg/dL
Hgb urine dipstick: NEGATIVE
Ketones, ur: NEGATIVE mg/dL
Leukocytes,Ua: NEGATIVE
Nitrite: NEGATIVE
Protein, ur: 30 mg/dL — AB
Specific Gravity, Urine: 1.024 (ref 1.005–1.030)
pH: 5 (ref 5.0–8.0)

## 2018-09-23 LAB — RAPID URINE DRUG SCREEN, HOSP PERFORMED
Amphetamines: NOT DETECTED
Barbiturates: NOT DETECTED
Benzodiazepines: NOT DETECTED
Cocaine: NOT DETECTED
Opiates: POSITIVE — AB
Tetrahydrocannabinol: NOT DETECTED

## 2018-09-23 LAB — CBC WITH DIFFERENTIAL/PLATELET
Abs Immature Granulocytes: 0.4 10*3/uL — ABNORMAL HIGH (ref 0.00–0.07)
Band Neutrophils: 20 %
Basophils Absolute: 0 10*3/uL (ref 0.0–0.1)
Basophils Relative: 0 %
Eosinophils Absolute: 0 10*3/uL (ref 0.0–0.5)
Eosinophils Relative: 0 %
HCT: 44.8 % (ref 39.0–52.0)
Hemoglobin: 15.2 g/dL (ref 13.0–17.0)
Lymphocytes Relative: 30 %
Lymphs Abs: 0.8 10*3/uL (ref 0.7–4.0)
MCH: 31.3 pg (ref 26.0–34.0)
MCHC: 33.9 g/dL (ref 30.0–36.0)
MCV: 92.4 fL (ref 80.0–100.0)
Metamyelocytes Relative: 7 %
Monocytes Absolute: 0.3 10*3/uL (ref 0.1–1.0)
Monocytes Relative: 11 %
Myelocytes: 8 %
Neutro Abs: 1.1 10*3/uL — ABNORMAL LOW (ref 1.7–7.7)
Neutrophils Relative %: 24 %
Platelets: 273 10*3/uL (ref 150–400)
RBC: 4.85 MIL/uL (ref 4.22–5.81)
RDW: 13.6 % (ref 11.5–15.5)
WBC: 2.6 10*3/uL — ABNORMAL LOW (ref 4.0–10.5)
nRBC: 0 % (ref 0.0–0.2)
nRBC: 5 /100 WBC — ABNORMAL HIGH

## 2018-09-23 LAB — COMPREHENSIVE METABOLIC PANEL
ALT: 13 U/L (ref 0–44)
AST: 25 U/L (ref 15–41)
Albumin: 2.3 g/dL — ABNORMAL LOW (ref 3.5–5.0)
Alkaline Phosphatase: 90 U/L (ref 38–126)
Anion gap: 16 — ABNORMAL HIGH (ref 5–15)
BUN: 74 mg/dL — ABNORMAL HIGH (ref 8–23)
CO2: 16 mmol/L — ABNORMAL LOW (ref 22–32)
Calcium: 8.4 mg/dL — ABNORMAL LOW (ref 8.9–10.3)
Chloride: 101 mmol/L (ref 98–111)
Creatinine, Ser: 3.68 mg/dL — ABNORMAL HIGH (ref 0.61–1.24)
GFR calc Af Amer: 19 mL/min — ABNORMAL LOW (ref >60)
GFR calc non Af Amer: 17 mL/min — ABNORMAL LOW (ref >60)
Glucose, Bld: 141 mg/dL — ABNORMAL HIGH (ref 70–99)
Potassium: 4.5 mmol/L (ref 3.5–5.1)
Sodium: 133 mmol/L — ABNORMAL LOW (ref 135–145)
Total Bilirubin: 0.6 mg/dL (ref 0.3–1.2)
Total Protein: 6.4 g/dL — ABNORMAL LOW (ref 6.5–8.1)

## 2018-09-23 LAB — PROTEIN, CSF: Total  Protein, CSF: 49 mg/dL — ABNORMAL HIGH (ref 15–45)

## 2018-09-23 LAB — LACTIC ACID, PLASMA: Lactic Acid, Venous: 2.7 mmol/L (ref 0.5–1.9)

## 2018-09-23 LAB — LIPASE, BLOOD: Lipase: 19 U/L (ref 11–51)

## 2018-09-23 LAB — MAGNESIUM: Magnesium: 2.7 mg/dL — ABNORMAL HIGH (ref 1.7–2.4)

## 2018-09-23 LAB — SARS CORONAVIRUS 2 BY RT PCR (HOSPITAL ORDER, PERFORMED IN ~~LOC~~ HOSPITAL LAB): SARS Coronavirus 2: NEGATIVE

## 2018-09-23 LAB — GLUCOSE, CSF: Glucose, CSF: 66 mg/dL (ref 40–70)

## 2018-09-23 LAB — BRAIN NATRIURETIC PEPTIDE: B Natriuretic Peptide: 176 pg/mL — ABNORMAL HIGH (ref 0.0–100.0)

## 2018-09-23 MED ORDER — ACETAMINOPHEN 650 MG RE SUPP
650.0000 mg | Freq: Once | RECTAL | Status: AC
  Administered 2018-09-23: 650 mg via RECTAL
  Filled 2018-09-23: qty 1

## 2018-09-23 MED ORDER — LACTATED RINGERS IV BOLUS
1000.0000 mL | Freq: Once | INTRAVENOUS | Status: AC
  Administered 2018-09-23: 1000 mL via INTRAVENOUS

## 2018-09-23 MED ORDER — LACTATED RINGERS IV BOLUS
500.0000 mL | Freq: Once | INTRAVENOUS | Status: AC
  Administered 2018-09-24: 500 mL via INTRAVENOUS

## 2018-09-23 MED ORDER — SODIUM CHLORIDE 0.9 % IV SOLN: 1.0000 g | INTRAVENOUS | Status: DC

## 2018-09-23 MED ORDER — VANCOMYCIN HCL IN DEXTROSE 1-5 GM/200ML-% IV SOLN
1000.0000 mg | Freq: Once | INTRAVENOUS | Status: AC
  Administered 2018-09-23: 1000 mg via INTRAVENOUS
  Filled 2018-09-23: qty 200

## 2018-09-23 MED ORDER — NOREPINEPHRINE 4 MG/250ML-% IV SOLN
0.0000 ug/min | INTRAVENOUS | Status: DC
  Administered 2018-09-23: 2 ug/min via INTRAVENOUS
  Filled 2018-09-23: qty 250

## 2018-09-23 MED ORDER — SODIUM CHLORIDE 0.9 % IV SOLN
2.0000 g | Freq: Once | INTRAVENOUS | Status: AC
  Administered 2018-09-23: 2 g via INTRAVENOUS
  Filled 2018-09-23: qty 2

## 2018-09-23 NOTE — ED Notes (Signed)
Resident at bedside with procedure tech attempting IV

## 2018-09-23 NOTE — Progress Notes (Signed)
Location:    [facility] Room Number: 216/A Place of Service:  SNF [phone]) Provider:  Kingsley Callander, MD  Patient Care Team: Kenneth Pike, MD as PCP - General Kenneth Sima [name], MD as PCP - Infectious Diseases (Infectious Diseases) Kenneth Parish, MD as Consulting Physician (Nephrology) Kenneth [name] (Transplant)  Extended Emergency Contact Information Primary Emergency Contact: Kenneth [name] Mobile Phone: [phone] Relation: Significant other Secondary Emergency Contact: Kenneth [name] Address: [place], [place] [address] Kenneth [name] of [name] Mobile Phone: [phone] Relation: Niece  Code Status:  Full Code Goals of care: Advanced Directive information Advanced Directives 09/23/2018  Does Patient Have a Medical Advance Directive? No  Type of Advance Directive (No Data)  Does patient want to make changes to medical advance directive? No - Patient declined  Copy of [place] in Chart? -  Would patient like information on creating a medical advance directive? No - Patient declined     Chief Complaint  Patient presents with   Acute Visit    Abdominal Pain    HPI:  Pt is a 63 y.o. male seen today for an acute visit for follow-up of abdominal pain also what appears to be hypotension and tachycardia.  Patient has a complex medical history he was recently recently admitted to [facility] living in rehab for short-term rehab with planned discharge last Friday however this was delayed because he was complaining of abdominal pain.  He also has a history of HIV DVT end-stage renal disease GERD lupus nephritis hyperlipidemia and hypertension as well as a sacral fracture secondary to a fall.  He does have a history of a renal transplant as well.  He he was seen at [facility] late last week secondary to dysphasia the results showed ulcerations in the  mid and distal esophagus that was biopsied with results pending.  They did prescribe  Sucralfate-and apparently he has been receiving that this weekend.  Apparently however he did not really eat  much over the weekend according to nursing he was taking fluids well.  Today however he was noted to have some progressive hypotension.  In fact when I took it I got varying readings systolically from the 82'U into the 70s.  He continues to complain what appears to be of some abdominal discomfort although he appears to indicate this is somewhat chronic.  He is afebrile-- he is noted to be tachycardia with pulses ranging from the lower 100s to up to slightly over 120.  He was noted to be tachycardic last week and started on Lopressor   Currently he is lying in bed he continues to be quite weak.  I did assess him with Kenneth [name] and he was noted to have hypoactive bowel sounds with concerns for a developing ileus.  Also with a tachycardia there were concerns for possible dehydration with some associated hypotension.     Past Medical History:  Diagnosis Date   Achalasia    Candida infection, esophageal ([place]) 08/2010   a. 2012 - noted on EGD.   DVT (deep venous thrombosis) ([place]) ~ 04/2014   ?RLE   Dysphagia 2008   post heller myotomy/toupee fundoplication.     ESRD (end stage renal disease) on dialysis [facility])    "MWF: [name]" (01/22/2017)   Fall 01/22/2017   mechanical fall w/multiple fractures/notes 01/22/2017   GERD (gastroesophageal  reflux disease)    hx (01/22/2017)   Hemorrhoids, internal    History of blood transfusion 03/2014   "low counts"   History of gout    History of stomach ulcers    HIV infection ([place]) dx ~ 2009   a. 02/13/2014 CD4 = 240 - undetectable viral load.   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  tortuous colon, internal hemorrhoids.  no polyps or diverticulosis.    ESOPHAGOGASTRODUODENOSCOPY  08/2010   Dr [name].  08/2010 candida esophagitis, no obvious esophageal stricture. 02/2006 and 05/2006 dilated esophagus, narrowed distal esophagus but no stricture, was empirically [place] dilated   ESOPHAGOGASTRODUODENOSCOPY N/A 06/27/2014   Procedure: ESOPHAGOGASTRODUODENOSCOPY (EGD);  Surgeon: [name], MD;  Location: [facility] ENDOSCOPY;  Service: Endoscopy;  Laterality: N/A;   ESOPHAGOGASTRODUODENOSCOPY N/A 07/02/2014   Procedure: ESOPHAGOGASTRODUODENOSCOPY (EGD);  Surgeon: [name], MD;  Location: [facility] ENDOSCOPY;  Service: Endoscopy;  Laterality: N/A;  with botox   [place]   with toupee fundoplication of HH.    INSERTION OF DIALYSIS CATHETER Right 06/29/2014   Procedure: INSERTION OF DIALYSIS CATHETER RIGHT IJ;  Surgeon: Rosetta Posner, MD;  Location: [facility];  Service: Vascular;  Laterality: Right;   KIDNEY TRANSPLANT     January 2020 @ [facility] Right ~ 2011   ORIF TIBIA PLATEAU Right 01/23/2017   Procedure: OPEN REDUCTION INTERNAL FIXATION (ORIF) TIBIAL PLATEAU; REPAIR OF LATERAL TIBIAL PLATEAU; ARTHROTOMY WITH MENISCUS REPAIR; ANTERIOR COMPARTMENT FASCIOTOMY;  Surgeon: Altamese Allegan, MD;  Location: [facility];  Service: Orthopedics;  Laterality: Right;    No Known Allergies  Outpatient Encounter Medications as of 09/23/2018  Medication Sig    abacavir-dolutegravir-lamiVUDine (TRIUMEQ) 600-50-300 MG tablet Take 1 tablet by mouth daily.   amitriptyline (ELAVIL) 10 MG tablet Take 1 tablet (10 mg total) by mouth at bedtime.   aspirin 81 MG chewable tablet Chew 81 mg by mouth daily.   calcitRIOL (ROCALTROL) 0.25 MCG capsule Take 0.25 mcg by mouth every Monday, Wednesday, and Friday. FOR ESRD WITH TRANSPLANT (DO NOT CRUSH)   Cholecalciferol 50 MCG (2000 UT) TABS Take 2,000 Units by mouth daily.    HYDROcodone-acetaminophen (NORCO) 7.5-325 MG tablet Take 1 tablet by mouth every 6 (six) hours as needed for moderate pain.   hydroxychloroquine (PLAQUENIL) 200 MG tablet Take 1 tablet (200 mg total) by mouth 2 (two) times daily.   metoprolol tartrate (LOPRESSOR) 25 MG tablet Take 12.5 mg by mouth 2 (two) times daily. Hold for SBP <105   mycophenolate (CELLCEPT) 250 MG capsule Take 250 mg by mouth every 12 (twelve) hours.    Nutritional Supplement LIQD Take 120 mLs by mouth 2 (two) times a day. Med[employer]   predniSONE (DELTASONE) 10 MG tablet Take 1 tablet (10 mg total) by mouth daily.  saccharomyces boulardii (FLORASTOR) 250 MG capsule Take 250 mg by mouth daily.    sulfamethoxazole-trimethoprim (BACTRIM DS) 800-160 MG tablet Take 1 tablet by mouth every Monday, Wednesday, and Friday.   tacrolimus (PROGRAF) 0.5 MG capsule Take 1.5 mg by mouth 2 (two) times daily.    tamsulosin (FLOMAX) 0.4 MG CAPS capsule Take 0.04 mg by mouth daily.   No facility-administered encounter medications on file as of 09/23/2018.     Review of Systems   In general he is not complain of fever chills does have significant weakness.  Skin he is not complaining of diaphoresis rashes or itching.  Head ears eyes nose mouth and throat does not really complain of visual changes or sore throat.  Respiratory does not complain of being short of breath or having a cough.  Cardiac complains of some chronic epigastric discomfort he describes this is chronic and  that he has had this to some extent for several weeks he does not really complain of nausea or vomiting-- but has a  poor appetite.  GI as noted above he does complain of some epigastric upper abdominal discomfort-----describes this as somewhat chronic has a poor appetite says he is taking supplements  Some  and fluids.  Does not really complain of nausea or vomiting.  GU does not complain of dysuria.  Musculoskeletal has diffuse weakness but does not complain of joint pain.  Neurologic does not complain of dizziness headache or syncopal type feelings but is significantly weak.  Psych does not complain of being depressed or anxious but has a flatter  affect  today is not as talkative  Immunization History  Administered Date(s) Administered   DTaP / Hep B / IPV 08/31/2006, 10/02/2006, 02/19/2007   Hepatitis A, Adult 08/23/2017, 03/21/2018   Hepatitis B 08/31/2006, 10/02/2006, 02/19/2007   Hepatitis B, adult 08/23/2017, 08/23/2017, 03/21/2018, 03/21/2018, 04/23/2018, 04/23/2018   Influenza Whole 01/22/2008   Influenza, Seasonal, Injecte, Preservative Fre 04/24/2016, 01/05/2017   Influenza-Unspecified 02/02/2016, 01/15/2017, 01/01/2018   Pneumococcal Conjugate-13 02/27/2017   Pneumococcal Polysaccharide-23 08/31/2006, 07/10/2017   Td 11/01/2004   Tdap 07/10/2017   Pertinent  Health Maintenance Due  Topic Date Due   COLONOSCOPY  09/29/2018 (Originally 06/01/2016)   INFLUENZA VACCINE  11/02/2018   Fall Risk  08/22/2018 03/21/2018 08/23/2017 03/01/2017 09/16/2015  Falls in the past year? 1 0 Yes Yes No  Number falls in past yr: 0 - 1 1 -  Injury with Fall? 1 - Yes Yes -   Functional Status Survey:    Vitals:   09/23/18 1512  BP: 90/61  Pulse: (!) 113  Resp: 18  Temp: 98.3 F (36.8 C)  TempSrc: Oral  There is no height or weight on file to calculate BMI. Physical Exam   blood pressure readings this afternoon range from the 40J to 81X systolically pulses range from  110-125 with any exertion-  General this is a frail middle-age male he does not appear to be in any acute distress but is very weak appearing.  His skin is warm and dry.  Eyes visual acuity appears to be intact.  Oropharynx mucous membranes appear somewhat dry.  Heart is tachycardic I got a rate between 115 and 120.  He does not have significant lower extremity edema.  His abdomen is soft --moderately tender to palpation with hypoactive bowel sounds.  Muscleskeletal does have general frailty but does move his extremities at baseline with significant weakness   Neurologic appears grossly intact his speech is clear talking a whole  lot.  He is alert.  Psych he is grossly oriented could name the month and year and answered questions generally appropriately-but he is not speaking as much --per Dr. Clayborn Heron assessment not quite as mentally alert as he has been in the past   Labs reviewed:  September 20, 2018.  TSH was 1.79.  Free T4 was 1.20.  Free T3 was 1.6.  September 17, 2018.  Sodium 136 potassium 4.6 BUN 25.6 creatinine 1.26.  Alkaline phosphatase was 151 otherwise liver function tests  Largely  within normal limits albumin was slightly low at 3.4   Recent Labs    03/07/18 1518 08/25/18 0208 09/04/18 09/13/18 09/17/18  NA 137 139 140 137 136*  K 5.4* 3.8 4.3 4.7 4.6  CL 94* 108  --   --   --   CO2 34* 24  --   --   --   GLUCOSE 107* 98  --   --   --   BUN 17 16 16 21  26*  CREATININE 7.19* 1.21 1.1 1.1 1.3  CALCIUM 8.9 8.6*  --   --   --    Recent Labs    03/07/18 1518 09/04/18 09/13/18 09/17/18  AST 26 16 26 22   ALT 15 12 28 18   ALKPHOS  --  224* 193* 151*  BILITOT 0.5  --   --   --   PROT 8.6*  --   --   --    Recent Labs    03/07/18 1518 08/25/18 0208 09/04/18 09/13/18 09/17/18  WBC 9.3 4.8 3.4 5.6 1.3  NEUTROABS  --  3.0 2 4 1   HGB 14.4 14.1 14.4 13.2* 13.6  HCT 41.3 46.1 44 40* 40*  MCV 94.5 104.3*  --   --   --   PLT 237 185 292 215 175   Lab  Results  Component Value Date   TSH 0.581 01/24/2017   No results found for: HGBA1C Lab Results  Component Value Date   CHOL 185 07/18/2017   HDL 37 (L) 07/18/2017   LDLCALC 120 (H) 07/18/2017   LDLDIRECT 65 07/24/2006   TRIG 158 (H) 07/18/2017   CHOLHDL 5.0 (H) 07/18/2017    Significant Diagnostic Results in last 30 days:  Ct Pelvis Wo Contrast  Result Date: 08/25/2018 CLINICAL DATA:  Left posterior hip pain status post trauma EXAM: CT PELVIS WITHOUT CONTRAST TECHNIQUE: Multidetector CT imaging of the pelvis was performed following the standard protocol without intravenous contrast. COMPARISON:  08/17/2018 FINDINGS: Urinary Tract: There is mild asymmetric bladder wall thickening. 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 Bowel: There is a large amount of stool in the colon. The appendix is not reliably identified. Vascular/Lymphatic: No pathologically enlarged lymph nodes. No significant vascular abnormality seen. Reproductive:  The prostate gland is significantly enlarged. Other:  None. Musculoskeletal: There are new acute minimally displaced bilateral sacral ala fractures. There is a compression fracture of the S2 vertebral body. There is diffuse osteopenia. IMPRESSION: 1. New acute minimally displaced fractures involving the bilateral sacral ala. 2. New compression fracture of the S2 vertebral body. 3. Diffuse osteopenia. 4. Slightly asymmetric bladder wall thickening which is nonspecific. Correlation with urinalysis is recommended. Outpatient urology follow-up is recommended. There is diffuse prostatomegaly. Electronically Signed   By: Constance Holster M.D.   On: 08/25/2018 01:41   Dg Hand Complete Left  Result Date: 08/24/2018 CLINICAL DATA:  Recent trip and fall with thumb deformity, initial encounter EXAM: LEFT HAND -  COMPLETE 3+ VIEW COMPARISON:  None. FINDINGS: There is dislocation at the first MCP joint with lateral displacement of the proximal  phalanx with respect to the metacarpal. No acute fracture is seen. No soft tissue abnormality is noted. IMPRESSION: Dislocation at the first MCP joint. Electronically Signed   By: [name] M.D.   On: 08/24/2018 22:16   Dg Finger Thumb Left  Result Date: 08/24/2018 CLINICAL DATA:  Post reduction radiographs EXAM: LEFT THUMB 2+V COMPARISON:  08/24/2018 FINDINGS: The patient is status post reduction at the first metacarpophalangeal joint. The alignment appears significantly improved. There may be some mild palmar subluxation. There is no evidence of a displaced fracture. There is surrounding soft tissue swelling. IMPRESSION: Improved alignment status post reduction. No evidence of a displaced fracture. Electronically Signed   By: Constance Holster M.D.   On: 08/24/2018 23:28   Dg Hip Unilat W Or Wo Pelvis 2-3 Views Left  Result Date: 08/24/2018 CLINICAL DATA:  Recent fall with left hip pain, initial encounter EXAM: DG HIP (WITH OR WITHOUT PELVIS) 3V LEFT COMPARISON:  08/17/2018 FINDINGS: There is no evidence of hip fracture or dislocation. There is no evidence of arthropathy or other focal bone abnormality. IMPRESSION: No acute abnormality noted. Electronically Signed   By: [name] M.D.   On: 08/24/2018 22:17    Assessment/Plan #1 tachycardia with hypotension and concerns for a clinical ileus.  Patient was assessed by Kenneth [name] as well and it was thought he may be developing an ileus as well as acute renal insufficiency with tachycardia and hypotension.  At this point will send him to the ER for expedient evaluation.  He is not in any acute distress but he is open to going to the ER and would suspect that he is at risk for deterioration without expedient follow-up and intervention      [place] PA-C [phone]

## 2018-09-23 NOTE — ED Notes (Signed)
Swelling noted around U/S IV in R bicep, fluids paused, IV removed, other access obtained. MD aware

## 2018-09-23 NOTE — ED Notes (Signed)
Please contact pts sister Roderic Scarce [phone] with updates

## 2018-09-23 NOTE — ED Notes (Signed)
Lungs remain clear, pt remains A & O, no neuro changes noted.

## 2018-09-23 NOTE — ED Notes (Signed)
CCM at bedside 

## 2018-09-23 NOTE — ED Triage Notes (Addendum)
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

## 2018-09-23 NOTE — Progress Notes (Addendum)
Pharmacy Antibiotic Note  Kenneth [name] is a 63 y.o. male admitted on 09/23/2018 with possible intra-abdominal infection. Tmax 98.3, lethargic, CBC pending. He is a kidney transplant and still makes urine, has fistula in L arm.  Currently in AKI with Scr 3.68 and baseline 1.1-1.3.  Pharmacy has been consulted for vancomycin and cefepime dosing.  Plan: Cefepime 1g q24hrs Vancomycin 1000mg  x1 loading dose F/u renal function for further vancomycin doses. Consider anaerobic coverage if no improvement Monitor renal function, culture data, LOT, vanc levels as needed  Weight: 116 lb 13.5 oz (53 kg)  Temp (24hrs), Avg:98.3 F (36.8 C), Min:98.3 F (36.8 C), Max:98.3 F (36.8 C)  Recent Labs  Lab 09/17/18  WBC 1.3  CREATININE 1.3    Estimated Creatinine Clearance: 44.7 mL/min (by C-G formula based on SCr of 1.3 mg/dL).    No Known Allergies  Antimicrobials this admission: Vancomycin 6/22 >> Cefepime 6/22 >>  Thank you for involving pharmacy in this patient's care.  Janae Bridgeman, PharmD PGY1 Pharmacy Resident Phone: [phone] 09/23/2018 7:03 PM

## 2018-09-23 NOTE — ED Notes (Signed)
Small to moderate liquid BM, full linen change done, Lungs clear, pt A & O, no neuro changes at this time.

## 2018-09-23 NOTE — Consult Note (Addendum)
NAME:  Kenneth [name], MRN:  [id number], DOB:  1955-09-29, LOS: 0 ADMISSION DATE:  09/23/2018, CONSULTATION DATE:  6/22 REFERRING MD:  Dr. Dayna Barker, CHIEF COMPLAINT:  Hypotension   Brief History   63 year old s/p renal transplant admitted 6/22 with hypotension due to ongoing diarrhea and poor PO intake.   History of present illness   63 year old male with PMH as below, which is significant for ESRD previously on HD now s/p transplant, HIV, HTN, and SLE with lupus nephritis. He was recently admitted to [facility] in late May after falls at home. He was found to have bilateral sacral alla fracture, S2 compression fracture, and L thumb dislocation. Physical therapy was recommended by orthopedics and he was discharged to [facility] senior care. While there, he was evaluated for dysphagia at [facility] and had endoscopy, which showed ulcerations in the mid and distal esophagus. The was seen by SNF provider 6/22 with complaints of abdominal pain. They learned he had poor PO intake for several days, as well as diarrhea for a similar time period. He was found to be tachycardic and hypotensive with SBP in the 60s. He was transferred to [facility] ED  Upon arrival his BP was in fact very low and he was febrile to 102. There was some difficulty in getting IV access, therefore he was not able to get IVF resuscitation immediately and vasoactive infusions were started once IV access was gained. Laboratory evaluation significant for Creatinine 3.7, sodium 133, potassium 4.5, CO2 16, BUN 74, magnesium 2.7, lactic acid 2.8, white blood cells 2.6, hemoglobin 15.2. CT held due to renal disease. He did have a few episodes of large volume watery diarrhea while in the ED. Also underwent LP while in ED. He was started on broad spectrum antibiotics and PCCM was called for admission due to pressors.   Past Medical History   has a past medical history of Achalasia, Candida infection, esophageal ([place]) (08/2010), DVT (deep venous  thrombosis) ([place]) (~ 04/2014), Dysphagia (2008), ESRD (end stage renal disease) on dialysis [facility]), Fall (01/22/2017), GERD (gastroesophageal reflux disease), Hemorrhoids, internal, History of blood transfusion (03/2014), History of gout, History of stomach ulcers, HIV infection ([place]) (dx ~ 2009), Hyperlipidemia, Hypertension, Insomnia, Lupus nephritis ([place]), Pancytopenia ([place]), Premature ventricular contractions, Renal abscess, and SLE (systemic lupus erythematosus) ([place]) (dx'd 2015).   Significant Hospital Events   6/22 admit  Consults:    Procedures:    Significant Diagnostic Tests:    Micro Data:  SARS-CoV-2 6/22 > negative Blood 6/22 > Urine 6/22 > GI panel PCR 6/22 > C. Diff 6/22 > CSF 6/22 > CSF HSV 6/22 >  Antimicrobials:  Cefepime 6/22 > Vancomycin 6/22 > Flagyl 6/22 >  Interim history/subjective:  Feeling better. [place]y  Objective   Blood pressure 103/63, pulse (!) 141, temperature (!) 101.1 F (38.4 C), resp. rate (!) 27, weight 53 kg, SpO2 100 %.        Intake/Output Summary (Last 24 hours) at 09/23/2018 2308 Last data filed at 09/23/2018 2144 Gross per 24 hour  Intake 1380.44 ml  Output 45 ml  Net 1335.44 ml   Filed Weights   09/23/18 1842  Weight: 53 kg    Examination: General: Frail elderly appearing male in NAD HENT: [place]/AT, PERRL, no JVD. Ulceration to the interior portion of the lower lip.  Lungs: Clear Cardiovascular: Tachy, regular, no MRG Abdomen: Soft, non-tender, non-distended Extremities: No acute deformity or ROM limitation Neuro: Alert, oriented, non-focal GU: Foley  Resolved  Hospital Problem list     Assessment & Plan:   Severe sepsis vs hypovolemia: SBP initially in the 60s, but now after 2 L IVF resuscitation he is off levophed with MAP 83. He has had poor PO intake and diarrhea for 4 days. May be relatively adrenally insufficient.  - Continue IVF resuscitation - Discontinue norepinephrine - Ensure lactic clearing -  Consider stress dose steroids as he is on chronic prednisone.   Diarrhea:  - Recommend CT abdomen - Cefepime, Vancomycin, Flagyl - Follow cultures as above - PRN loperamide - Clear liquid diet   AG acidosis: R/t hypotension, also suspect non gap component due to excessive diarrhea.  - Start low rate bicarb infusion  ESRD: previously on HD, now s/p transplant - Continue home anti-rejection medications - Consider stress dose steroids  Dysphagia: Feels like food getting stuck in throat. EGD at [facility] last week shows esophageal ulceration.  - Consider ENT  Lupus - Continue home plaquenil   HIV - Continue home Triumeq  Possible Right pulmonary nodule - Outpatient follow up  Lip sore - Monitor   Best practice:  Diet: clear liquid Pain/Anxiety/Delirium protocol (if indicated): NA VAP protocol (if indicated): NA DVT prophylaxis: sq heparin GI prophylaxis: PPI Glucose control: NA Mobility: BR Code Status: FULL Family Communication: Per primary Disposition: PCU  Labs   CBC: Recent Labs  Lab 09/17/18 09/23/18 1854  WBC 1.3 2.6*  NEUTROABS 1 1.1*  HGB 13.6 15.2  HCT 40* 44.8  MCV  --  92.4  PLT 175 875    Basic Metabolic Panel: Recent Labs  Lab 09/17/18 09/23/18 1854  NA 136* 133*  K 4.6 4.5  CL  --  101  CO2  --  16*  GLUCOSE  --  141*  BUN 26* 74*  CREATININE 1.3 3.68*  CALCIUM  --  8.4*  MG  --  2.7*   GFR: Estimated Creatinine Clearance: 15.8 mL/min (A) (by C-G formula based on SCr of 3.68 mg/dL (H)). Recent Labs  Lab 09/17/18 09/23/18 1854  WBC 1.3 2.6*  LATICACIDVEN  --  2.7*    Liver Function Tests: Recent Labs  Lab 09/17/18 09/23/18 1854  AST 22 25  ALT 18 13  ALKPHOS 151* 90  BILITOT  --  0.6  PROT  --  6.4*  ALBUMIN  --  2.3*   Recent Labs  Lab 09/23/18 1854  LIPASE 19   No results for input(s): AMMONIA in the last 168 hours.  ABG    Component Value Date/Time   TCO2 20 01/01/2009 1505     Coagulation Profile: No  results for input(s): INR, PROTIME in the last 168 hours.  Cardiac Enzymes: No results for input(s): CKTOTAL, CKMB, CKMBINDEX, TROPONINI in the last 168 hours.  HbA1C: No results found for: HGBA1C  CBG: No results for input(s): GLUCAP in the last 168 hours.  Review of Systems:   Bolds are positive  Constitutional: weight loss, gain, night sweats, Fevers, chills, fatigue .  HEENT: headaches, Sore throat, sneezing, nasal congestion, post nasal drip, Difficulty swallowing, Tooth/dental problems, visual complaints visual changes, ear ache CV:  chest pain, radiates:3,Orthopnea, PND, swelling in lower extremities, dizziness, palpitations, syncope.  GI  heartburn, indigestion, abdominal pain, nausea, vomiting, diarrhea, change in bowel habits, loss of appetite, bloody stools.  Resp: cough, productive: , hemoptysis, dyspnea, chest pain, pleuritic.  Skin: rash or itching or icterus GU: dysuria, change in color of urine, urgency or frequency. flank pain, hematuria  MS: joint pain or  swelling. decreased range of motion  Psych: change in mood or affect. depression or anxiety.  Neuro: difficulty with speech, weakness, numbness, ataxia    Past Medical History  He,  has a past medical history of Achalasia, Candida infection, esophageal ([place]) (08/2010), DVT (deep venous thrombosis) ([place]) (~ 04/2014), Dysphagia (2008), ESRD (end stage renal disease) on dialysis [facility]), Fall (01/22/2017), GERD (gastroesophageal reflux disease), Hemorrhoids, internal, History of blood transfusion (03/2014), History of gout, History of stomach ulcers, HIV infection ([place]) (dx ~ 2009), Hyperlipidemia, Hypertension, Insomnia, Lupus nephritis ([place]), Pancytopenia ([place]), Premature ventricular contractions, Renal abscess, and SLE (systemic lupus erythematosus) ([place]) (dx'd 2015).   Surgical History    Past Surgical History:  Procedure Laterality Date  . BASCILIC VEIN TRANSPOSITION Left 06/29/2014   Procedure: FIRST STAGE  [place];  Surgeon: Rosetta Posner, MD;  Location: [place];  Service: Vascular;  Laterality: Left;  . BASCILIC VEIN TRANSPOSITION Left 09/11/2014   Procedure: SECOND STAGE BASILIC VEIN TRANSPOSITION LEFT UPPER ARM;  Surgeon: Rosetta Posner, MD;  Location: [facility];  Service: Vascular;  Laterality: Left;  . COLONOSCOPY  2008   .  tortuous colon, internal hemorrhoids.  no polyps or diverticulosis.   [facility] ESOPHAGOGASTRODUODENOSCOPY  08/2010   Dr [name].  08/2010 candida esophagitis, no obvious esophageal stricture. 02/2006 and 05/2006 dilated esophagus, narrowed distal esophagus but no stricture, was empirically Savary dilated  . ESOPHAGOGASTRODUODENOSCOPY N/A 06/27/2014   Procedure: ESOPHAGOGASTRODUODENOSCOPY (EGD);  Surgeon: [name], MD;  Location: [facility] ENDOSCOPY;  Service: Endoscopy;  Laterality: N/A;  . ESOPHAGOGASTRODUODENOSCOPY N/A 07/02/2014   Procedure: ESOPHAGOGASTRODUODENOSCOPY (EGD);  Surgeon: [name], MD;  Location: [facility] ENDOSCOPY;  Service: Endoscopy;  Laterality: N/A;  with botox  . [place]   with toupee fundoplication of HH.   . INSERTION OF DIALYSIS CATHETER Right 06/29/2014   Procedure: INSERTION OF DIALYSIS CATHETER RIGHT IJ;  Surgeon: Rosetta Posner, MD;  Location: [place];  Service: Vascular;  Laterality: Right;  . KIDNEY TRANSPLANT     January 2020 @ [facility]  . NEPHRECTOMY Right ~ 2011  . ORIF TIBIA PLATEAU Right 01/23/2017   Procedure: OPEN REDUCTION INTERNAL FIXATION (ORIF) TIBIAL PLATEAU; REPAIR OF LATERAL TIBIAL PLATEAU; ARTHROTOMY WITH MENISCUS REPAIR; ANTERIOR COMPARTMENT FASCIOTOMY;  Surgeon: Altamese Th[place]ville, MD;  Location: [place];  Service: Orthopedics;  Laterality: Right;     Social History   reports that he has never smoked. He has never used smokeless tobacco. He reports that he does not drink alcohol or use drugs.   Family History   His family history includes Colon cancer in his father; Hypertension in his mother; Other in his mother and another family member.    Allergies No Known Allergies   Home Medications  Prior to Admission medications   Medication Sig Start Date End Date Taking? Authorizing Provider  abacavir-dolutegravir-lamiVUDine (TRIUMEQ) 600-50-300 MG tablet Take 1 tablet by mouth daily. 03/21/18   [place] Callas, NP  amitriptyline (ELAVIL) 10 MG tablet Take 1 tablet (10 mg total) by mouth at bedtime. 02/02/17   Angiulli, Lavon Paganini, PA-C  aspirin 81 MG chewable tablet Chew 81 mg by mouth daily.    [provider]  calcitRIOL (ROCALTROL) 0.25 MCG capsule Take 0.25 mcg by mouth every Monday, Wednesday, and Friday. FOR ESRD WITH TRANSPLANT (DO NOT CRUSH)    [provider]  Cholecalciferol 50 MCG (2000 UT) TABS Take 2,000 Units by mouth daily.     [provider]  HYDROcodone-acetaminophen [facility]) 7.5-325  MG tablet Take 1 tablet by mouth every 6 (six) hours as needed for moderate pain. 08/28/18   Hendricks Limes, MD  hydroxychloroquine (PLAQUENIL) 200 MG tablet Take 1 tablet (200 mg total) by mouth 2 (two) times daily. 02/02/17   Angiulli, Lavon Paganini, PA-C  metoprolol tartrate (LOPRESSOR) 25 MG tablet Take 12.5 mg by mouth 2 (two) times daily. Hold for SBP <105    [provider]  mycophenolate (CELLCEPT) 250 MG capsule Take 250 mg by mouth every 12 (twelve) hours.  04/18/18   [provider]  Nutritional Supplement LIQD Take 120 mLs by mouth 2 (two) times a day. [employer]    [provider]  predniSONE (DELTASONE) 10 MG tablet Take 1 tablet (10 mg total) by mouth daily. 02/02/17   Angiulli, Lavon Paganini, PA-C  saccharomyces boulardii (FLORASTOR) 250 MG capsule Take 250 mg by mouth daily.  09/16/18 09/30/18  [provider]  sulfamethoxazole-trimethoprim (BACTRIM DS) 800-160 MG tablet Take 1 tablet by mouth every Monday, Wednesday, and Friday.    [provider]  tacrolimus (PROGRAF) 0.5 MG capsule Take 1.5 mg by mouth 2 (two) times daily.  08/06/18 08/01/19  [provider]   tamsulosin (FLOMAX) 0.4 MG CAPS capsule Take 0.04 mg by mouth daily. 07/17/18   [provider]     Critical care time:      Georgann Housekeeper, AGACNP-BC [place] Pager [phone] or [phone]  09/23/2018 11:53 PM   Patient seen and examined, agree with above note.  I dictated the care and orders written for this patient under my direction. Hypovolemic shock resolved after 2 L NS , off levophed Sepsis secondary to C diff colitis Dc broad spectrum abx , recommend flagyl and vanco po CT scan of the abdomen and pelvis as he takes immunosupression and is HIV + Cont fluid resuscitation and electrolyte replacement Ok to admit to step down  CC time spent : 40 minutes D/w ED staff    Roxanne Mins, [place]

## 2018-09-23 NOTE — ED Notes (Signed)
Temp probe catheter inserted, immediately following pt had 3 moderate sized, light brown malodorous liquid bowel movements.  Sample obtained, cdiff testing ordered

## 2018-09-23 NOTE — ED Provider Notes (Signed)
[facility] EMERGENCY DEPARTMENT Provider Note   CSN: [id number] Arrival date & time: 09/23/18  1815    History   Chief Complaint Chief Complaint  Patient presents with  . tachycardia  . hypotensive    HPI Kenneth [name] is a 63 y.o. male.     The history is provided by the patient, the EMS personnel and medical records.  Fever Temp source:  Subjective Severity:  Moderate Onset quality:  Gradual Duration:  4 days Timing:  Intermittent Progression:  Worsening Chronicity:  New Relieved by:  Nothing Worsened by:  Nothing Ineffective treatments:  Acetaminophen and cold compresses Associated symptoms: chills, dysuria and myalgias   Associated symptoms: no confusion, no cough, no headaches, no rash and no vomiting   Risk factors: immunosuppression and sick contacts     Past Medical History:  Diagnosis Date  . Achalasia   . Candida infection, esophageal ([place]) 08/2010   a. 2012 - noted on EGD.  Marland Kitchen DVT (deep venous thrombosis) ([place]) ~ 04/2014   ?RLE  . Dysphagia 2008   post heller myotomy/toupee fundoplication.    . ESRD (end stage renal disease) on dialysis [facility])    "MWF: [name]" (01/22/2017)  . Fall 01/22/2017   mechanical fall w/multiple fractures/notes 01/22/2017  . GERD (gastroesophageal reflux disease)    hx (01/22/2017)  . Hemorrhoids, internal   . History of blood transfusion 03/2014   "low counts"  . History of gout   . History of stomach ulcers   . HIV infection ([place]) dx ~ 2009   a. 02/13/2014 CD4 = 240 - undetectable viral load.  . Hyperlipidemia   . Hypertension    hx (01/22/2017)  . Insomnia   . Lupus nephritis ([place])   . Pancytopenia ([place])   . Premature ventricular contractions   . Renal abscess   . SLE (systemic lupus erythematosus) ([place]) dx'd 2015    Patient Active Problem List   Diagnosis Date Noted  . Esophageal ulcer 09/21/2018  . History of renal transplant 09/19/2018  . Pelvic fracture ([place]) 08/25/2018  .  Multiple falls 08/25/2018  . Thumb fracture 08/25/2018  . Closed nondisplaced zone I fracture of sacrum ([place]) 08/22/2018  . Tachycardia 08/22/2018  . Suprapubic pain 04/11/2018  . Osteoporosis 03/15/2017  . Health care maintenance 06/30/2016  . Chronic fatigue 03/11/2015  . Exacerbation of systemic lupus ([place]) 02/24/2015  . Dysphagia   . ESRD (end stage renal disease) ([place])   . DVT (deep venous thrombosis) ([place]) 06/22/2014  . PVC's (premature ventricular contractions) 03/17/2014  . Abdominal pain   . Protein-calorie malnutrition, severe ([place]) 02/11/2014  . Lupus nephritis ([place])   . Fever   . Arthralgia of multiple sites, bilateral 02/02/2014  . Bilateral low back pain without sciatica 02/01/2014  . EXTERNAL HEMORRHOIDS WITHOUT MENTION COMP 01/20/2009  . HIV disease ([place]) 07/24/2006  . Hyperlipidemia 05/31/2006  . ERECTILE DYSFUNCTION 05/31/2006  . INSOMNIA NOS 05/31/2006    Past Surgical History:  Procedure Laterality Date  . BASCILIC VEIN TRANSPOSITION Left 06/29/2014   Procedure: FIRST STAGE  [place];  Surgeon: Rosetta Posner, MD;  Location: [facility];  Service: Vascular;  Laterality: Left;  . BASCILIC VEIN TRANSPOSITION Left 09/11/2014   Procedure: SECOND STAGE BASILIC VEIN TRANSPOSITION LEFT UPPER ARM;  Surgeon: Rosetta Posner, MD;  Location: [facility];  Service: Vascular;  Laterality: Left;  . COLONOSCOPY  2008   .  tortuous colon, internal hemorrhoids.  no polyps or diverticulosis.   Marland Kitchen  ESOPHAGOGASTRODUODENOSCOPY  08/2010   Dr [name].  08/2010 candida esophagitis, no obvious esophageal stricture. 02/2006 and 05/2006 dilated esophagus, narrowed distal esophagus but no stricture, was empirically Savary dilated  . ESOPHAGOGASTRODUODENOSCOPY N/A 06/27/2014   Procedure: ESOPHAGOGASTRODUODENOSCOPY (EGD);  Surgeon: [name], MD;  Location: [facility] ENDOSCOPY;  Service: Endoscopy;  Laterality: N/A;  . ESOPHAGOGASTRODUODENOSCOPY N/A 07/02/2014   Procedure: ESOPHAGOGASTRODUODENOSCOPY  (EGD);  Surgeon: [name], MD;  Location: [facility]OSCOPY;  Service: Endoscopy;  Laterality: N/A;  with botox  . [place]   with toupee fundoplication of HH.   . INSERTION OF DIALYSIS CATHETER Right 06/29/2014   Procedure: INSERTION OF DIALYSIS CATHETER RIGHT IJ;  Surgeon: Rosetta Posner, MD;  Location: [facility];  Service: Vascular;  Laterality: Right;  . KIDNEY TRANSPLANT     January 2020 @ [facility]  . NEPHRECTOMY Right ~ 2011  . ORIF TIBIA PLATEAU Right 01/23/2017   Procedure: OPEN REDUCTION INTERNAL FIXATION (ORIF) TIBIAL PLATEAU; REPAIR OF LATERAL TIBIAL PLATEAU; ARTHROTOMY WITH MENISCUS REPAIR; ANTERIOR COMPARTMENT FASCIOTOMY;  Surgeon: Altamese Wynne, MD;  Location: [facility];  Service: Orthopedics;  Laterality: Right;        Home Medications    Prior to Admission medications   Medication Sig Start Date End Date Taking? Authorizing Provider  abacavir-dolutegravir-lamiVUDine (TRIUMEQ) 600-50-300 MG tablet Take 1 tablet by mouth daily. 03/21/18   Zelienople Callas, NP  amitriptyline (ELAVIL) 10 MG tablet Take 1 tablet (10 mg total) by mouth at bedtime. 02/02/17   Angiulli, Lavon Paganini, PA-C  aspirin 81 MG chewable tablet Chew 81 mg by mouth daily.    [provider]  calcitRIOL (ROCALTROL) 0.25 MCG capsule Take 0.25 mcg by mouth every Monday, Wednesday, and Friday. FOR ESRD WITH TRANSPLANT (DO NOT CRUSH)    [provider]  Cholecalciferol 50 MCG (2000 UT) TABS Take 2,000 Units by mouth daily.     [provider]  HYDROcodone-acetaminophen (NORCO) 7.5-325 MG tablet Take 1 tablet by mouth every 6 (six) hours as needed for moderate pain. 08/28/18   Hendricks Limes, MD  hydroxychloroquine (PLAQUENIL) 200 MG tablet Take 1 tablet (200 mg total) by mouth 2 (two) times daily. 02/02/17   Angiulli, Lavon Paganini, PA-C  metoprolol tartrate (LOPRESSOR) 25 MG tablet Take 12.5 mg by mouth 2 (two) times daily. Hold for SBP <105    [provider]  mycophenolate (CELLCEPT)  250 MG capsule Take 250 mg by mouth every 12 (twelve) hours.  04/18/18   [provider]  Nutritional Supplement LIQD Take 120 mLs by mouth 2 (two) times a day. [employer]    [provider]  predniSONE (DELTASONE) 10 MG tablet Take 1 tablet (10 mg total) by mouth daily. 02/02/17   Angiulli, Lavon Paganini, PA-C  saccharomyces boulardii (FLORASTOR) 250 MG capsule Take 250 mg by mouth daily.  09/16/18 09/30/18  [provider]  sulfamethoxazole-trimethoprim (BACTRIM DS) 800-160 MG tablet Take 1 tablet by mouth every Monday, Wednesday, and Friday.    [provider]  tacrolimus (PROGRAF) 0.5 MG capsule Take 1.5 mg by mouth 2 (two) times daily.  08/06/18 08/01/19  [provider]  tamsulosin (FLOMAX) 0.4 MG CAPS capsule Take 0.04 mg by mouth daily. 07/17/18   [provider]    Family History Family History  Problem Relation Age of Onset  . Colon cancer Father        deceased  . Other Mother        s/p pacemaker - alive and well.  Marland Kitchen  Hypertension Mother   . Other Other        5 brothers, 3 sisters - alive and well.    Social History Social History   Tobacco Use  . Smoking status: Never Smoker  . Smokeless tobacco: Never Used  Substance Use Topics  . Alcohol use: No  . Drug use: No     Allergies   Patient has no known allergies.   Review of Systems Review of Systems  Constitutional: Positive for chills and fever.  Respiratory: Negative for cough.   Gastrointestinal: Negative for vomiting.  Genitourinary: Positive for dysuria.  Musculoskeletal: Positive for myalgias.  Skin: Negative for rash.  Neurological: Negative for headaches.  Psychiatric/Behavioral: Negative for confusion.  All other systems reviewed and are negative.    Physical Exam Updated Vital Signs BP 103/63   Pulse (!) 141   Temp (!) 101.1 F (38.4 C)   Resp (!) 27   Wt 53 kg   SpO2 100%   BMI 16.30 kg/m   Physical Exam Vitals signs and nursing note reviewed.   Constitutional:      Appearance: He is well-developed. He is toxic-appearing.  HENT:     Head: Normocephalic and atraumatic.     Mouth/Throat:     Mouth: Mucous membranes are dry.  Eyes:     Conjunctiva/sclera: Conjunctivae normal.  Neck:     Musculoskeletal: Neck supple.  Cardiovascular:     Rate and Rhythm: Regular rhythm. Tachycardia present.  Pulmonary:     Breath sounds: No stridor. Rhonchi present.     Comments: Coarse breath sounds throughout, respiratory rate 20 breaths/min Abdominal:     Palpations: Abdomen is soft.     Tenderness: There is abdominal tenderness. There is no guarding or rebound.  Musculoskeletal:     Right lower leg: No edema.     Left lower leg: No edema.  Skin:    General: Skin is warm and dry.     Capillary Refill: Capillary refill takes more than 3 seconds.  Neurological:     Mental Status: He is alert and oriented to person, place, and time.      ED Treatments / Results  Labs (all labs ordered are listed, but only abnormal results are displayed) Labs Reviewed  LACTIC ACID, PLASMA - Abnormal; Notable for the following components:      Result Value   Lactic Acid, Venous 2.7 (*)    All other components within normal limits  COMPREHENSIVE METABOLIC PANEL - Abnormal; Notable for the following components:   Sodium 133 (*)    CO2 16 (*)    Glucose, Bld 141 (*)    BUN 74 (*)    Creatinine, Ser 3.68 (*)    Calcium 8.4 (*)    Total Protein 6.4 (*)    Albumin 2.3 (*)    GFR calc non Af Amer 17 (*)    GFR calc Af Amer 19 (*)    Anion gap 16 (*)    All other components within normal limits  CBC WITH DIFFERENTIAL/PLATELET - Abnormal; Notable for the following components:   WBC 2.6 (*)    Neutro Abs 1.1 (*)    nRBC 5 (*)    Abs Immature Granulocytes 0.40 (*)    All other components within normal limits  URINALYSIS, ROUTINE W REFLEX MICROSCOPIC - Abnormal; Notable for the following components:   Color, Urine AMBER (*)    APPearance CLOUDY (*)     Bilirubin Urine SMALL (*)    Protein,  ur 30 (*)    Bacteria, UA RARE (*)    All other components within normal limits  BRAIN NATRIURETIC PEPTIDE - Abnormal; Notable for the following components:   B Natriuretic Peptide 176.0 (*)    All other components within normal limits  MAGNESIUM - Abnormal; Notable for the following components:   Magnesium 2.7 (*)    All other components within normal limits  RAPID URINE DRUG SCREEN, HOSP PERFORMED - Abnormal; Notable for the following components:   Opiates POSITIVE (*)    All other components within normal limits  SARS CORONAVIRUS 2 (HOSPITAL ORDER, [place] [facility])  CULTURE, BLOOD (ROUTINE X 2)  CULTURE, BLOOD (ROUTINE X 2)  URINE CULTURE  C DIFFICILE QUICK SCREEN W PCR REFLEX  CSF CULTURE  GASTROINTESTINAL PANEL BY PCR, STOOL (REPLACES STOOL CULTURE)  LIPASE, BLOOD  LACTIC ACID, PLASMA  CSF CELL COUNT WITH DIFFERENTIAL  CSF CELL COUNT WITH DIFFERENTIAL  GLUCOSE, CSF  PROTEIN, CSF  CRYPTOCOCCAL ANTIGEN, CSF    EKG EKG Interpretation  Date/Time:  Monday September 23 2018 18:27:57 EDT Ventricular Rate:  143 PR Interval:    QRS Duration: 85 QT Interval:  349 QTC Calculation: 539 R Axis:   -52 Text Interpretation:  Sinus tachycardia Probable left atrial enlargement Borderline low voltage, extremity leads Left ventricular hypertrophy Anterior ST elevation, probably due to LVH Prolonged QT interval similar to previousin 10/18  with a faster rate Confirmed by Merrily Pew [id number]) on 09/23/2018 7:05:51 PM   Radiology Dg Chest Port 1 View  Result Date: 09/23/2018 CLINICAL DATA:  63 year old male with sepsis. EXAM: PORTABLE CHEST 1 VIEW COMPARISON:  Chest radiograph dated 01/22/2017 and CT dated 06/22/2014 FINDINGS: There is mild eventration of the left hemidiaphragm. There is no focal consolidation, pleural effusion, or pneumothorax. Mild prominence of the pulmonary vasculature with cephalization may represent mild  congestion. Apparent 9 mm nodular density over the posterior right sixth rib likely an area of old healed fracture. A pulmonary nodule is less likely. Nonemergent CT may provide better evaluation if clinically indicated. Additional old healed right posterior rib fractures noted. There is a 5 mm nodular density over the left mid lung field, possibly a granuloma. The cardiac silhouette is within normal limits. There is dextroscoliosis of the thoracic spine. No acute osseous pathology. IMPRESSION: 1. No focal consolidation. 2. Possible mild vascular congestion. 3. Probable focus of old healed rib fracture versus less likely a 9 mm right pulmonary nodule. CT may provide better evaluation on a nonemergent basis. Electronically Signed   By: Anner Crete M.D.   On: 09/23/2018 19:11    Procedures .Lumbar Puncture  Date/Time: 09/23/2018 10:24 PM Performed by: Lonzo Candy, MD Authorized by: Lonzo Candy, MD   Consent:    Consent obtained:  Verbal   Consent given by:  Patient   Risks discussed:  Bleeding, headache, infection, nerve damage, pain and repeat procedure   Alternatives discussed:  Observation Pre-procedure details:    Procedure purpose:  Diagnostic   Preparation: Patient was prepped and draped in usual sterile fashion   Sedation:    Sedation type:  Anxiolysis Anesthesia (see MAR for exact dosages):    Anesthesia method:  Local infiltration   Local anesthetic:  Lidocaine 1% w/o epi Procedure details:    Lumbar space:  L3-L4 interspace   Patient position:  L lateral decubitus   Needle gauge:  20   Needle type:  Spinal needle - Quincke tip   Needle length (in):  1.5  Ultrasound guidance: no     Number of attempts:  1   Fluid appearance:  Clear   Tubes of fluid:  4   Total volume (ml):  6 Post-procedure:    Puncture site:  Adhesive bandage applied   Patient tolerance of procedure:  Tolerated well, no immediate complications   (including critical care time)  Medications  Ordered in ED Medications  norepinephrine (LEVOPHED) 4mg  in 266mL premix infusion (2 mcg/min Intravenous New Bag/Given 09/23/18 1925)  ceFEPIme (MAXIPIME) 1 g in sodium chloride 0.9 % 100 mL IVPB (has no administration in time range)  lactated ringers bolus 1,000 mL (0 mLs Intravenous Stopped 09/23/18 2144)  ceFEPIme (MAXIPIME) 2 g in sodium chloride 0.9 % 100 mL IVPB (0 g Intravenous Stopped 09/23/18 2011)  vancomycin (VANCOCIN) IVPB 1000 mg/200 mL premix (0 mg Intravenous Stopped 09/23/18 2144)  acetaminophen (TYLENOL) suppository 650 mg (650 mg Rectal Given 09/23/18 1919)  lactated ringers bolus 1,000 mL (1,000 mLs Intravenous New Bag/Given 09/23/18 2147)     Initial Impression / Assessment and Plan / ED Course  I have reviewed the triage vital signs and the nursing notes.  Pertinent labs & imaging results that were available during my care of the patient were reviewed by me and considered in my medical decision making (see chart for details).        Medical Decision Making: Kenneth [name] is a 63 y.o. male who presented to the ED today with hypotension, tachycardia, fevers over the last 4 days.  Past medical history significant for HIV, malnourished, ESRD prev on dialysis, lupus with nephritis Reviewed and confirmed nursing documentation for past medical history, family history, social history.  On my initial exam, the pt was ill-appearing, tachycardic, hypotensive, cachectic, tachypneic febrile, responsive and interactive when verbal stimuli given, GCS 15, no focal neuro deficits appreciated, code sepsis called, antibiotics started antipyretics given, fluid resuscitation started.  Suspected source GI GU given abdominal pain, chest x-ray clear of any new opacities, no new cough, no significant oxygen requirement.  Given history of renal transplant will wait on creatinine before obtaining advanced CT imaging. Patient being resuscitated with IV fluids but persistently hypotensive  peripheral norepinephrine started Creatinine 3.7, sodium 133, potassium 4.5, CO2 16, BUN 74, magnesium 2.7, lactic acid 2.8, white blood cells 2.6, hemoglobin 15.2 We will hold off on CT imaging at this time given acute kidney injury, will reassess risk versus benefits based on patient status Urinalysis does not show overt signs of infection, patient is having active diarrhea in emergency department, could be source of infection Lumbar puncture performed to assess for encephalitis versus meningitis.  COVID testing negative. All radiology and laboratory studies reviewed independently and with my attending physician, agree with reading provided by radiologist unless otherwise noted.  Upon reassessing patient, patient was calm, resting comfortably, patient feeling minimally improved after fluids Based on the above findings, I believe patient requires admission.  Patient will be admitted to the ICU for further evaluation and care. Pt admitted.  The above care was discussed with and agreed upon by my attending physician. Emergency Department Medication Summary:  Medications  norepinephrine (LEVOPHED) 4mg  in 244mL premix infusion (2 mcg/min Intravenous New Bag/Given 09/23/18 1925)  ceFEPIme (MAXIPIME) 1 g in sodium chloride 0.9 % 100 mL IVPB (has no administration in time range)  lactated ringers bolus 1,000 mL (0 mLs Intravenous Stopped 09/23/18 2144)  ceFEPIme (MAXIPIME) 2 g in sodium chloride 0.9 % 100 mL IVPB (0 g Intravenous Stopped 09/23/18 2011)  vancomycin (VANCOCIN) IVPB 1000 mg/200 mL premix (0 mg Intravenous Stopped 09/23/18 2144)  acetaminophen (TYLENOL) suppository 650 mg (650 mg Rectal Given 09/23/18 1919)  lactated ringers bolus 1,000 mL (1,000 mLs Intravenous New Bag/Given 09/23/18 2147)   Final Clinical Impressions(s) / ED Diagnoses   Final diagnoses:  Sepsis, due to unspecified organism, unspecified whether acute organ dysfunction present ([place])  AKI (acute kidney injury) ([place])   Altered mental status, unspecified altered mental status type    ED Discharge Orders    None       Lonzo Candy, MD 09/23/18 2306    Merrily Pew, MD 09/24/18 0272

## 2018-09-23 NOTE — ED Notes (Signed)
BP cuff moved to L thigh for attempts at more consistent reading.

## 2018-09-24 ENCOUNTER — Inpatient Hospital Stay (HOSPITAL_COMMUNITY): Payer: Medicare Other

## 2018-09-24 ENCOUNTER — Other Ambulatory Visit: Payer: Medicare Other

## 2018-09-24 DIAGNOSIS — A419 Sepsis, unspecified organism: Secondary | ICD-10-CM | POA: Diagnosis not present

## 2018-09-24 DIAGNOSIS — R571 Hypovolemic shock: Secondary | ICD-10-CM | POA: Diagnosis present

## 2018-09-24 DIAGNOSIS — Z681 Body mass index (BMI) 19 or less, adult: Secondary | ICD-10-CM | POA: Diagnosis not present

## 2018-09-24 DIAGNOSIS — R7881 Bacteremia: Secondary | ICD-10-CM | POA: Diagnosis not present

## 2018-09-24 DIAGNOSIS — B954 Other streptococcus as the cause of diseases classified elsewhere: Secondary | ICD-10-CM | POA: Diagnosis not present

## 2018-09-24 DIAGNOSIS — K5100 Ulcerative (chronic) pancolitis without complications: Secondary | ICD-10-CM | POA: Diagnosis not present

## 2018-09-24 DIAGNOSIS — E872 Acidosis: Secondary | ICD-10-CM | POA: Diagnosis present

## 2018-09-24 DIAGNOSIS — K219 Gastro-esophageal reflux disease without esophagitis: Secondary | ICD-10-CM | POA: Diagnosis present

## 2018-09-24 DIAGNOSIS — R6520 Severe sepsis without septic shock: Secondary | ICD-10-CM | POA: Diagnosis not present

## 2018-09-24 DIAGNOSIS — A0472 Enterocolitis due to Clostridium difficile, not specified as recurrent: Secondary | ICD-10-CM | POA: Diagnosis present

## 2018-09-24 DIAGNOSIS — Y92129 Unspecified place in nursing home as the place of occurrence of the external cause: Secondary | ICD-10-CM | POA: Diagnosis not present

## 2018-09-24 DIAGNOSIS — A414 Sepsis due to anaerobes: Secondary | ICD-10-CM | POA: Diagnosis present

## 2018-09-24 DIAGNOSIS — Z940 Kidney transplant status: Secondary | ICD-10-CM | POA: Diagnosis not present

## 2018-09-24 DIAGNOSIS — R6510 Systemic inflammatory response syndrome (SIRS) of non-infectious origin without acute organ dysfunction: Secondary | ICD-10-CM | POA: Diagnosis not present

## 2018-09-24 DIAGNOSIS — N170 Acute kidney failure with tubular necrosis: Secondary | ICD-10-CM | POA: Diagnosis present

## 2018-09-24 DIAGNOSIS — B20 Human immunodeficiency virus [HIV] disease: Secondary | ICD-10-CM | POA: Diagnosis present

## 2018-09-24 DIAGNOSIS — G9341 Metabolic encephalopathy: Secondary | ICD-10-CM | POA: Diagnosis not present

## 2018-09-24 DIAGNOSIS — R64 Cachexia: Secondary | ICD-10-CM | POA: Diagnosis present

## 2018-09-24 DIAGNOSIS — Z79899 Other long term (current) drug therapy: Secondary | ICD-10-CM | POA: Diagnosis not present

## 2018-09-24 DIAGNOSIS — Z86718 Personal history of other venous thrombosis and embolism: Secondary | ICD-10-CM | POA: Diagnosis not present

## 2018-09-24 DIAGNOSIS — W19XXXA Unspecified fall, initial encounter: Secondary | ICD-10-CM | POA: Diagnosis present

## 2018-09-24 DIAGNOSIS — Z1159 Encounter for screening for other viral diseases: Secondary | ICD-10-CM | POA: Diagnosis not present

## 2018-09-24 DIAGNOSIS — K562 Volvulus: Secondary | ICD-10-CM | POA: Diagnosis present

## 2018-09-24 DIAGNOSIS — Z7952 Long term (current) use of systemic steroids: Secondary | ICD-10-CM | POA: Diagnosis not present

## 2018-09-24 DIAGNOSIS — M328 Other forms of systemic lupus erythematosus: Secondary | ICD-10-CM

## 2018-09-24 DIAGNOSIS — D61818 Other pancytopenia: Secondary | ICD-10-CM | POA: Diagnosis not present

## 2018-09-24 DIAGNOSIS — B370 Candidal stomatitis: Secondary | ICD-10-CM | POA: Diagnosis present

## 2018-09-24 DIAGNOSIS — K2210 Ulcer of esophagus without bleeding: Secondary | ICD-10-CM | POA: Diagnosis present

## 2018-09-24 DIAGNOSIS — B3781 Candidal esophagitis: Secondary | ICD-10-CM | POA: Diagnosis present

## 2018-09-24 DIAGNOSIS — D703 Neutropenia due to infection: Secondary | ICD-10-CM | POA: Diagnosis not present

## 2018-09-24 DIAGNOSIS — E785 Hyperlipidemia, unspecified: Secondary | ICD-10-CM | POA: Diagnosis present

## 2018-09-24 DIAGNOSIS — I959 Hypotension, unspecified: Secondary | ICD-10-CM | POA: Diagnosis not present

## 2018-09-24 DIAGNOSIS — N179 Acute kidney failure, unspecified: Secondary | ICD-10-CM | POA: Diagnosis present

## 2018-09-24 DIAGNOSIS — E43 Unspecified severe protein-calorie malnutrition: Secondary | ICD-10-CM | POA: Diagnosis present

## 2018-09-24 DIAGNOSIS — M3214 Glomerular disease in systemic lupus erythematosus: Secondary | ICD-10-CM | POA: Diagnosis present

## 2018-09-24 DIAGNOSIS — N186 End stage renal disease: Secondary | ICD-10-CM | POA: Diagnosis not present

## 2018-09-24 DIAGNOSIS — K567 Ileus, unspecified: Secondary | ICD-10-CM | POA: Diagnosis not present

## 2018-09-24 DIAGNOSIS — Z7982 Long term (current) use of aspirin: Secondary | ICD-10-CM | POA: Diagnosis not present

## 2018-09-24 DIAGNOSIS — B950 Streptococcus, group A, as the cause of diseases classified elsewhere: Secondary | ICD-10-CM | POA: Diagnosis not present

## 2018-09-24 DIAGNOSIS — K633 Ulcer of intestine: Secondary | ICD-10-CM | POA: Diagnosis present

## 2018-09-24 LAB — CSF CELL COUNT WITH DIFFERENTIAL
RBC Count, CSF: 0 /mm3
RBC Count, CSF: 2 /mm3 — ABNORMAL HIGH
Tube #: 1
Tube #: 4
WBC, CSF: 1 /mm3 (ref 0–5)
WBC, CSF: 1 /mm3 (ref 0–5)

## 2018-09-24 LAB — GASTROINTESTINAL PANEL BY PCR, STOOL (REPLACES STOOL CULTURE)

## 2018-09-24 LAB — BLOOD CULTURE ID PANEL (REFLEXED)

## 2018-09-24 LAB — BASIC METABOLIC PANEL
Anion gap: 14 (ref 5–15)
BUN: 77 mg/dL — ABNORMAL HIGH (ref 8–23)
CO2: 14 mmol/L — ABNORMAL LOW (ref 22–32)
Calcium: 7.6 mg/dL — ABNORMAL LOW (ref 8.9–10.3)
Chloride: 103 mmol/L (ref 98–111)
Creatinine, Ser: 3.51 mg/dL — ABNORMAL HIGH (ref 0.61–1.24)
GFR calc Af Amer: 21 mL/min — ABNORMAL LOW (ref >60)
GFR calc non Af Amer: 18 mL/min — ABNORMAL LOW (ref >60)
Glucose, Bld: 104 mg/dL — ABNORMAL HIGH (ref 70–99)
Potassium: 4.6 mmol/L (ref 3.5–5.1)
Sodium: 131 mmol/L — ABNORMAL LOW (ref 135–145)

## 2018-09-24 LAB — PATHOLOGIST SMEAR REVIEW

## 2018-09-24 LAB — URINE CULTURE: Culture: NO GROWTH

## 2018-09-24 LAB — CBC
HCT: 40.2 % (ref 39.0–52.0)
Hemoglobin: 13.7 g/dL (ref 13.0–17.0)
MCH: 31.2 pg (ref 26.0–34.0)
MCHC: 34.1 g/dL (ref 30.0–36.0)
MCV: 91.6 fL (ref 80.0–100.0)
Platelets: 203 10*3/uL (ref 150–400)
RBC: 4.39 MIL/uL (ref 4.22–5.81)
RDW: 13.6 % (ref 11.5–15.5)
WBC: 2.5 10*3/uL — ABNORMAL LOW (ref 4.0–10.5)
nRBC: 0 % (ref 0.0–0.2)

## 2018-09-24 LAB — C DIFFICILE QUICK SCREEN W PCR REFLEX
C Diff antigen: POSITIVE — AB
C Diff interpretation: DETECTED
C Diff toxin: POSITIVE — AB

## 2018-09-24 LAB — T-HELPER CELLS (CD4) COUNT (NOT AT ARMC)
CD4 % Helper T Cell: 24 % — ABNORMAL LOW (ref 33–65)
CD4 T Cell Abs: 45 /uL — ABNORMAL LOW (ref 400–1790)

## 2018-09-24 LAB — LACTIC ACID, PLASMA: Lactic Acid, Venous: 1.6 mmol/L (ref 0.5–1.9)

## 2018-09-24 LAB — CRYPTOCOCCAL ANTIGEN, CSF: Crypto Ag: NEGATIVE

## 2018-09-24 MED ORDER — MAGIC MOUTHWASH
5.0000 mL | Freq: Three times a day (TID) | ORAL | Status: DC
  Administered 2018-09-24 – 2018-09-28 (×15): 5 mL via ORAL
  Filled 2018-09-24 (×18): qty 5

## 2018-09-24 MED ORDER — TACROLIMUS 1 MG PO CAPS
1.5000 mg | ORAL_CAPSULE | Freq: Two times a day (BID) | ORAL | Status: DC
  Administered 2018-09-24 – 2018-09-25 (×3): 1.5 mg via ORAL
  Filled 2018-09-24 (×4): qty 1

## 2018-09-24 MED ORDER — SULFAMETHOXAZOLE-TRIMETHOPRIM 800-160 MG PO TABS
1.0000 | ORAL_TABLET | ORAL | Status: DC
  Administered 2018-09-25: 1 via ORAL
  Filled 2018-09-24: qty 1

## 2018-09-24 MED ORDER — ACETAMINOPHEN 325 MG PO TABS: 650.0000 mg | ORAL_TABLET | Freq: Four times a day (QID) | ORAL | Status: DC | PRN

## 2018-09-24 MED ORDER — VITAMIN D 25 MCG (1000 UNIT) PO TABS
2000.0000 [IU] | ORAL_TABLET | Freq: Every day | ORAL | Status: DC
  Administered 2018-09-25 – 2018-09-28 (×4): 2000 [IU] via ORAL
  Filled 2018-09-24 (×6): qty 2

## 2018-09-24 MED ORDER — HYDROCORTISONE NA SUCCINATE PF 100 MG IJ SOLR
100.0000 mg | Freq: Every day | INTRAMUSCULAR | Status: DC
  Administered 2018-09-24: 100 mg via INTRAVENOUS
  Filled 2018-09-24: qty 2

## 2018-09-24 MED ORDER — SODIUM CHLORIDE 0.9 % IV SOLN
INTRAVENOUS | Status: DC
  Administered 2018-09-24 – 2018-09-25 (×2): via INTRAVENOUS

## 2018-09-24 MED ORDER — SACCHAROMYCES BOULARDII 250 MG PO CAPS
250.0000 mg | ORAL_CAPSULE | Freq: Every day | ORAL | Status: DC
  Administered 2018-09-24 – 2018-09-26 (×3): 250 mg via ORAL
  Filled 2018-09-24 (×4): qty 1

## 2018-09-24 MED ORDER — HEPARIN SODIUM (PORCINE) 5000 UNIT/ML IJ SOLN
5000.0000 [IU] | Freq: Three times a day (TID) | INTRAMUSCULAR | Status: DC
  Administered 2018-09-24 – 2018-09-27 (×11): 5000 [IU] via SUBCUTANEOUS
  Filled 2018-09-24 (×9): qty 1

## 2018-09-24 MED ORDER — ACETAMINOPHEN 650 MG RE SUPP: 650.0000 mg | Freq: Four times a day (QID) | RECTAL | Status: DC | PRN

## 2018-09-24 MED ORDER — AMITRIPTYLINE HCL 10 MG PO TABS
10.0000 mg | ORAL_TABLET | Freq: Every day | ORAL | Status: DC
  Administered 2018-09-24 – 2018-09-26 (×3): 10 mg via ORAL
  Filled 2018-09-24 (×3): qty 1

## 2018-09-24 MED ORDER — NYSTATIN 100000 UNIT/ML MT SUSP
5.0000 mL | Freq: Four times a day (QID) | OROMUCOSAL | Status: DC
  Administered 2018-09-24 – 2018-09-28 (×20): 500000 [IU] via ORAL
  Filled 2018-09-24 (×19): qty 5

## 2018-09-24 MED ORDER — HYDROCODONE-ACETAMINOPHEN 7.5-325 MG PO TABS
1.0000 | ORAL_TABLET | Freq: Four times a day (QID) | ORAL | Status: DC | PRN
  Administered 2018-09-25: 1 via ORAL
  Filled 2018-09-24 (×2): qty 1

## 2018-09-24 MED ORDER — ABACAVIR-DOLUTEGRAVIR-LAMIVUD 600-50-300 MG PO TABS
1.0000 | ORAL_TABLET | Freq: Every day | ORAL | Status: DC
  Administered 2018-09-24 – 2018-09-27 (×4): 1 via ORAL
  Filled 2018-09-24 (×6): qty 1

## 2018-09-24 MED ORDER — VANCOMYCIN 50 MG/ML ORAL SOLUTION
500.0000 mg | Freq: Three times a day (TID) | ORAL | Status: DC
  Administered 2018-09-24 – 2018-09-28 (×18): 500 mg via ORAL
  Filled 2018-09-24 (×27): qty 10

## 2018-09-24 MED ORDER — ONDANSETRON HCL 4 MG/2ML IJ SOLN
4.0000 mg | Freq: Four times a day (QID) | INTRAMUSCULAR | Status: DC | PRN
  Administered 2018-09-28: 4 mg via INTRAVENOUS
  Filled 2018-09-24: qty 2

## 2018-09-24 MED ORDER — ASPIRIN 81 MG PO CHEW
81.0000 mg | CHEWABLE_TABLET | Freq: Every day | ORAL | Status: DC
  Administered 2018-09-24 – 2018-09-28 (×5): 81 mg via ORAL
  Filled 2018-09-24 (×5): qty 1

## 2018-09-24 MED ORDER — MYCOPHENOLATE MOFETIL 250 MG PO CAPS
250.0000 mg | ORAL_CAPSULE | Freq: Two times a day (BID) | ORAL | Status: DC
  Administered 2018-09-24 – 2018-09-25 (×3): 250 mg via ORAL
  Filled 2018-09-24 (×4): qty 1

## 2018-09-24 MED ORDER — HYDROCORTISONE NA SUCCINATE PF 100 MG IJ SOLR
50.0000 mg | Freq: Three times a day (TID) | INTRAMUSCULAR | Status: DC
  Administered 2018-09-24 – 2018-09-25 (×3): 50 mg via INTRAVENOUS
  Filled 2018-09-24 (×3): qty 2

## 2018-09-24 MED ORDER — HYDROXYCHLOROQUINE SULFATE 200 MG PO TABS
200.0000 mg | ORAL_TABLET | Freq: Two times a day (BID) | ORAL | Status: DC
  Administered 2018-09-24 – 2018-09-28 (×9): 200 mg via ORAL
  Filled 2018-09-24 (×12): qty 1

## 2018-09-24 MED ORDER — SODIUM CHLORIDE 0.9 % IV BOLUS
1000.0000 mL | Freq: Once | INTRAVENOUS | Status: AC
  Administered 2018-09-24: 1000 mL via INTRAVENOUS

## 2018-09-24 MED ORDER — METRONIDAZOLE IN NACL 5-0.79 MG/ML-% IV SOLN
500.0000 mg | Freq: Three times a day (TID) | INTRAVENOUS | Status: DC
  Administered 2018-09-24 – 2018-09-29 (×16): 500 mg via INTRAVENOUS
  Filled 2018-09-24 (×15): qty 100

## 2018-09-24 MED ORDER — ONDANSETRON HCL 4 MG PO TABS: 4.0000 mg | ORAL_TABLET | Freq: Four times a day (QID) | ORAL | Status: DC | PRN

## 2018-09-24 NOTE — Clinical Social Work Note (Signed)
Patient discharged to [facility] for short-term rehab on 5/26 and was supposed to discharge home on Friday. They currently don't have any beds. Patient will need to be evaluated by PT when appropriate to determine disposition. MD is aware.  Dayton Scrape, [place]

## 2018-09-24 NOTE — Progress Notes (Signed)
PROGRESS NOTE    Kenneth [name]  FGH:[id number] DOB: Mar 09, 1956 DOA: 09/23/2018 PCP: [name], MD   Brief Narrative:  Kenneth [name] is a 63 y.o. 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  He was at [facility] and he was seen yesterday by the provider with a complaint of abdominal pain.  He was found to be having poor p.o. intake for past 4 days as well as diarrhea and he was tachycardic and hypotensive at that time so he was sent to the ER for further evaluation.  Upon arrival to the ER, he had all the parameters of sepsis.  He was initially given a dose of cefepime, vancomycin and Flagyl.  He was tested positive for C. Difficile.  He was significantly hypotensive initially to the point that vasopressors need to be started and PCCM was initially consulted for admission however after given 2 L of IV fluid bolus, and he was taken off of the vasopressors so admission was deferred to try and hospitalist.  He was started on IV Flagyl and oral vancomycin for C. difficile colitis.  CT abdomen and pelvis was done which showed pancolitis as well as several new fractures which were greater trochanteric fracture of the right femur, sacral flexure and L5 fracture.  I have consulted orthopedics for his fractures and I am going to proceed with MRI of the lumbar spine to further assess for L5 fracture.  PS: He was recently admitted to [facility] in late May after falls at home and was discharged on 08/27/2018. He was found to have bilateral sacral alla fracture, S2 compression fracture, and L thumb dislocation. Physical therapy was recommended by orthopedics and he was discharged to [facility] senior care. While there, he was evaluated for dysphagia at [facility] and had endoscopy, which showed ulcerations in the mid and distal esophagus.   Consultants:   Orthopedics  Procedures:   None  Antimicrobials:   IV Flagyl and oral  vancomycin   Subjective: Patient seen and examined in trauma to bed in ER earlier today.  He obviously was very cachectic and sick looking gentleman.  His only complaint was abdominal pain and diarrhea.  He also had oral pain and oral ulcers with oral thrush.  Objective: Vitals:   09/24/18 0940 09/24/18 0945 09/24/18 0950 09/24/18 1050  BP: 116/72  118/71 124/64  Pulse:    (!) 113  Resp: (!) 22 19 (!) 25 19  Temp: 98.3 F (36.8 C) 98.3 F (36.8 C) 98.3 F (36.8 C) (!) 97.5 F (36.4 C)  TempSrc:    Oral  SpO2:    (!) 87%  Weight:    50.8 kg  Height:    5\' 11"  (1.803 m)    Intake/Output Summary (Last 24 hours) at 09/24/2018 1139 Last data filed at 09/24/2018 0814 Gross per 24 hour  Intake 6837.78 ml  Output 45 ml  Net 6792.78 ml   Filed Weights   09/23/18 1842 09/24/18 1050  Weight: 53 kg 50.8 kg    Examination:  General exam: Appears calm and comfortable but very cachectic and sick looking Respiratory system: Clear to auscultation. Respiratory effort normal. Cardiovascular system: S1 & S2 heard, sinus tachycardia, no JVD, murmurs, rubs, gallops or clicks. No pedal edema. Gastrointestinal system: Abdomen is nondistended, soft and tender mostly at periumbilical and right lower quadrant. No organomegaly or masses felt. Normal bowel sounds heard. Central nervous system: Alert  and oriented. No focal neurological deficits. Extremities: Symmetric 5 x 5 power. Skin: No rashes, lesions or ulcers Psychiatry: Judgement and insight appear poor . Mood & affect flat   Data Reviewed: I have personally reviewed following labs and imaging studies  CBC: Recent Labs  Lab 09/23/18 1854 09/24/18 0408  WBC 2.6* 2.5*  NEUTROABS 1.1*  --   HGB 15.2 13.7  HCT 44.8 40.2  MCV 92.4 91.6  PLT 273 277   Basic Metabolic Panel: Recent Labs  Lab 09/23/18 1854 09/24/18 0408  NA 133* 131*  K 4.5 4.6  CL 101 103  CO2 16* 14*  GLUCOSE 141* 104*  BUN 74* 77*  CREATININE 3.68* 3.51*   CALCIUM 8.4* 7.6*  MG 2.7*  --    GFR: Estimated Creatinine Clearance: 15.9 mL/min (A) (by C-G formula based on SCr of 3.51 mg/dL (H)). Liver Function Tests: Recent Labs  Lab 09/23/18 1854  AST 25  ALT 13  ALKPHOS 90  BILITOT 0.6  PROT 6.4*  ALBUMIN 2.3*   Recent Labs  Lab 09/23/18 1854  LIPASE 19   No results for input(s): AMMONIA in the last 168 hours. Coagulation Profile: No results for input(s): INR, PROTIME in the last 168 hours. Cardiac Enzymes: No results for input(s): CKTOTAL, CKMB, CKMBINDEX, TROPONINI in the last 168 hours. BNP (last 3 results) No results for input(s): PROBNP in the last 8760 hours. HbA1C: No results for input(s): HGBA1C in the last 72 hours. CBG: No results for input(s): GLUCAP in the last 168 hours. Lipid Profile: No results for input(s): CHOL, HDL, LDLCALC, TRIG, CHOLHDL, LDLDIRECT in the last 72 hours. Thyroid Function Tests: No results for input(s): TSH, T4TOTAL, FREET4, T3FREE, THYROIDAB in the last 72 hours. Anemia Panel: No results for input(s): VITAMINB12, FOLATE, FERRITIN, TIBC, IRON, RETICCTPCT in the last 72 hours. Sepsis Labs: Recent Labs  Lab 09/23/18 1854 09/24/18 0408  LATICACIDVEN 2.7* 1.6    Recent Results (from the past 240 hour(s))  Novel Coronavirus, NAA ([facility])     Status: None   Collection Time: 09/16/18 12:00 AM  Result Value Ref Range Status   SARS-CoV-2, NAA *Not Detected   Final  Blood Culture (routine x 2)     Status: None (Preliminary result)   Collection Time: 09/23/18  6:54 PM   Specimen: Site Not Specified; Blood  Result Value Ref Range Status   Specimen Description SITE NOT SPECIFIED  Final   Special Requests   Final    BOTTLES DRAWN AEROBIC AND ANAEROBIC Blood Culture adequate volume   Culture   Final    NO GROWTH < 24 HOURS Performed at [facility], [address], [place], [place] [address]    Report Status PENDING  Incomplete  Blood Culture (routine x 2)     Status: None  (Preliminary result)   Collection Time: 09/23/18  6:54 PM   Specimen: Site Not Specified; Blood  Result Value Ref Range Status   Specimen Description SITE NOT SPECIFIED  Final   Special Requests   Final    BOTTLES DRAWN AEROBIC ONLY Blood Culture adequate volume   Culture   Final    NO GROWTH < 24 HOURS Performed at [facility], [place] [address], [place], [place] [address]    Report Status PENDING  Incomplete  SARS Coronavirus 2 (CEPHEID - Performed in [place] hospital lab), Hosp Order     Status: None   Collection Time: 09/23/18  7:04 PM   Specimen: Nasopharyngeal Swab  Result Value Ref Range Status   SARS Coronavirus 2 NEGATIVE NEGATIVE Final    Comment: (NOTE) If result is NEGATIVE SARS-CoV-2 target nucleic acids are NOT DETECTED. The SARS-CoV-2 RNA is generally detectable in upper and lower  respiratory specimens during the acute phase of infection. The lowest  concentration of SARS-CoV-2 viral copies this assay can detect is 250  copies / mL. A negative result does not preclude SARS-CoV-2 infection  and should not be used as the sole basis for treatment or other  patient management decisions.  A negative result may occur with  improper specimen collection / handling, submission of specimen other  than nasopharyngeal swab, presence of viral mutation(s) within the  areas targeted by this assay, and inadequate number of viral copies  (<250 copies / mL). A negative result must be combined with clinical  observations, patient history, and epidemiological information. If result is POSITIVE SARS-CoV-2 target nucleic acids are DETECTED. The SARS-CoV-2 RNA is generally detectable in upper and lower  respiratory specimens dur ing the acute phase of infection.  Positive  results are indicative of active infection with SARS-CoV-2.  Clinical  correlation with patient history and other diagnostic information is  necessary to determine patient infection status.  Positive results  do  not rule out bacterial infection or co-infection with other viruses. If result is PRESUMPTIVE POSTIVE SARS-CoV-2 nucleic acids MAY BE PRESENT.   A presumptive positive result was obtained on the submitted specimen  and confirmed on repeat testing.  While 2019 novel coronavirus  (SARS-CoV-2) nucleic acids may be present in the submitted sample  additional confirmatory testing may be necessary for epidemiological  and / or clinical management purposes  to differentiate between  SARS-CoV-2 and other Sarbecovirus currently known to infect humans.  If clinically indicated additional testing with an alternate test  methodology [phone]) is advised. The SARS-CoV-2 RNA is generally  detectable in upper and lower respiratory sp ecimens during the acute  phase of infection. The expected result is Negative. Fact Sheet for Patients:  StrictlyIdeas.no Fact Sheet for Healthcare Providers: BankingDealers.co.za This test is not yet approved or cleared by the [place] FDA and has been authorized for detection and/or diagnosis of SARS-CoV-2 by FDA under an Emergency Use Authorization (EUA).  This EUA will remain in effect (meaning this test can be used) for the duration of the COVID-19 declaration under Section 564(b)(1) of the Act, 21 U.S.C. section 360bbb-3(b)(1), unless the authorization is terminated or revoked sooner. Performed at [facility], [place] [address] [place], [place] [address]   C difficile quick scan w PCR reflex     Status: Abnormal   Collection Time: 09/23/18  8:35 PM   Specimen: Urine, Catheterized; Stool  Result Value Ref Range Status   C Diff antigen POSITIVE (A) NEGATIVE Final   C Diff toxin POSITIVE (A) NEGATIVE Final   C Diff interpretation Toxin producing C. difficile detected.  Final    Comment: CRITICAL RESULT CALLED TO, READ BACK BY AND VERIFIED WITH: A DENNIS RN 09/24/18 0015 JDW Performed at [facility], [address] [place], [place] [address]   CSF culture     Status: None (Preliminary result)   Collection Time: 09/23/18 10:12 PM   Specimen: CSF; Cerebrospinal Fluid  Result Value Ref Range Status   Specimen Description CSF  Final   Special Requests NONE  Final   Gram Stain NO WBC SEEN NO ORGANISMS SEEN CYTOSPIN SMEAR   Final   Culture  Final    NO GROWTH < 12 HOURS Performed at [place] [address], [place], [place] [address]    Report Status PENDING  Incomplete      Radiology Studies: Ct Abdomen Pelvis Wo Contrast  Result Date: 09/24/2018 CLINICAL DATA:  63 year old male with diarrhea and sepsis. History of renal abscess and HIV. EXAM: CT ABDOMEN AND PELVIS WITHOUT CONTRAST TECHNIQUE: Multidetector CT imaging of the abdomen and pelvis was performed following the standard protocol without IV contrast. COMPARISON:  CT of the pelvis dated 08/25/2018 and CT of the abdomen pelvis dated 06/22/2014 FINDINGS: Evaluation of this exam is limited in the absence of intravenous contrast. Evaluation is also limited due to cachexia. Lower chest: The visualized lung bases are clear. No intra-abdominal free air. Probable mesenteric edema. Hepatobiliary: The liver is grossly unremarkable. Layering stones noted within the gallbladder. No pericholecystic fluid. Pancreas: The pancreas is grossly unremarkable as visualized. Spleen: Normal in size without focal abnormality. Adrenals/Urinary Tract: The adrenal glands are grossly unremarkable. Atrophic left kidney. No hydronephrosis. 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 There is a punctate stone in the region of the transplant hilum. Mild fullness of the renal transplant collecting system. No peritransplant fluid collection. The urinary bladder is decompressed around a Foley catheter and contains a small pocket of air. Stomach/Bowel: There is diffuse inflammatory changes and thickening of the colon and rectosigmoid consistent  with pancolitis. Correlation with clinical exam and stool cultures recommended. No bowel obstruction. Surgical clips in the upper abdomen noted. The appendix is not visualized. Vascular/Lymphatic: The abdominal aorta and IVC are grossly unremarkable on this noncontrast CT. No portal venous gas. Evaluation of the lymph nodes is very limited. No obvious adenopathy. Reproductive: The prostate gland is poorly visualized. Other: Severe cachexia. Musculoskeletal: Nondisplaced fracture of the greater trochanter of the right femur. 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 Old left pubic bone fractures. Fractures of the sacral alum as seen on the prior CT with no healing. Nondisplaced fracture of the right L5 transverse process. IMPRESSION: 1. Pancolitis. Correlation with clinical exam and stool cultures recommended. No bowel obstruction. 2. Cholelithiasis. 3. Right lower quadrant renal transplant. No hydronephrosis. 4. Fractures of the greater trochanter of the right femur, right transverse process of L5, sacrum, and left pubic bone. 5. Cachexia. Electronically Signed   By: Anner Crete M.D.   On: 09/24/2018 03:44   Dg Chest Port 1 View  Result Date: 09/23/2018 CLINICAL DATA:  63 year old male with sepsis. EXAM: PORTABLE CHEST 1 VIEW COMPARISON:  Chest radiograph dated 01/22/2017 and CT dated 06/22/2014 FINDINGS: There is mild eventration of the left hemidiaphragm. There is no focal consolidation, pleural effusion, or pneumothorax. Mild prominence of the pulmonary vasculature with cephalization may represent mild congestion. Apparent 9 mm nodular density over the posterior right sixth rib likely an area of old healed fracture. A pulmonary nodule is less likely. Nonemergent CT may provide better evaluation if clinically indicated. Additional old healed right posterior rib fractures noted. There is a 5 mm nodular density over the left mid lung field, possibly a granuloma. The cardiac silhouette is  within normal limits. There is dextroscoliosis of the thoracic spine. No acute osseous pathology. IMPRESSION: 1. No focal consolidation. 2. Possible mild vascular congestion. 3. Probable focus of old healed rib fracture versus less likely a 9 mm right pulmonary nodule. CT may provide better evaluation on a nonemergent basis. Electronically Signed   By: Laren Everts.D.  On: 09/23/2018 19:11    Scheduled Meds: . abacavir-dolutegravir-lamiVUDine  1 tablet Oral Daily  . amitriptyline  10 mg Oral QHS  . aspirin  81 mg Oral Daily  . cholecalciferol  2,000 Units Oral Daily  . heparin  5,000 Units Subcutaneous Q8H  . hydrocortisone sod succinate (SOLU-CORTEF) inj  50 mg Intravenous Q8H  . hydroxychloroquine  200 mg Oral BID  . magic mouthwash  5 mL Oral TID  . mycophenolate  250 mg Oral Q12H  . nystatin  5 mL Oral QID  . saccharomyces boulardii  250 mg Oral Daily  . [START ON 09/25/2018] sulfamethoxazole-trimethoprim  1 tablet Oral Q M,W,F  . tacrolimus  1.5 mg Oral BID  . vancomycin  500 mg Oral TID AC & HS   Continuous Infusions: . sodium chloride 125 mL/hr at 09/24/18 1134  . metronidazole 500 mg (09/24/18 1136)     LOS: 0 days   Assessment & Plan:   Principal Problem:   Severe sepsis ([place]) Active Problems:   HIV disease ([place])   AKI (acute kidney injury) ([place])   SLE (systemic lupus erythematosus) ([place])   History of renal transplant   Current chronic use of systemic steroids   C. difficile diarrhea  Severe sepsis secondary to C. difficile colitis: CT scan confirms pancolitis.  Continue IV Flagyl and oral vancomycin and maintain isolation.  Continue IV hydration.  Lactic acidosis has resolved.  He underwent LP and CSF is not indicative of any meningitis.  Follow blood cultures.  Greater trochanteric fracture of the right femur/sacral fracture/left pubic fracture: Orthopedics consulted.  Management deferred to them.  Right transverse process of L5 fracture: Proceed with MRI  without contrast.  Consider neurosurgery consultation based on the results.  AKI: Likely prerenal secondary to sepsis and dehydration and hypotension.  Continue hydration and repeat labs in the morning.  HIV: Continue HAART therapy.  SLE: Continue hydroxychloroquine and stress dose steroids.  History of renal transplant: Continue CellCept and Prograf.  Stress dose steroids.  DVT prophylaxis: Heparin Code Status: Full code by default Family Communication: No family available.  I was informed by nursing that his family is perhaps not aware of his HIV status.  Patient is not in a condition at this point in time to allow me to talk to his family so we will reassess him tomorrow morning to see if he is more alert and would want [place] to call his family. Disposition Plan: To be determined based on clinical course   Time spent: 41 minutes   Darliss Cheney, MD [facility] Pager [phone]  If 7PM-7AM, please contact night-coverage [email or web address] Password [facility] 09/24/2018, 11:39 AM

## 2018-09-24 NOTE — Plan of Care (Signed)

## 2018-09-24 NOTE — Sepsis Progress Note (Signed)
Attempted to call RN about 2nd lactic acid draw,  But RN not available at the time.

## 2018-09-24 NOTE — ED Notes (Signed)
C/o pain in mouth--has white sores on inside of lower lip and white coating on tongue. Will notify dr.

## 2018-09-24 NOTE — Progress Notes (Signed)
CT reviewed: 1) only infectious source in abdomen seen is the expected pancolitis (consistent with C.Diff). 2) Fractures of the greater trochanter of the right femur, right transverse process of L5, sacrum, and left pubic bone.  They didn't call the Greater trochanter fracture or left pubic bone fracture on CT last admit... may want to touch base with ortho during admission, but presumably will be non-operative management at the moment (ceritanly dont think hes headed to OR with severe sepsis and hypotension).

## 2018-09-24 NOTE — Progress Notes (Signed)
PHARMACY - PHYSICIAN COMMUNICATION CRITICAL VALUE ALERT - BLOOD CULTURE IDENTIFICATION (BCID)  Kenneth [name] is an 63 y.o. male who presented to [facility] on [facility]/23/2018 with a chief complaint of abdominal pain and diarrhea, receiving treatment for C. difficile diarrhea. 1 of 3 admit BCx now growing GPCs, BCID strep spp.  Name of physician (or Provider) Contacted: Pahwani  Current antibiotics: PO vancomycin, IV metronidazole; PTA Bactrim prophylaxis resumed on admit  Changes to prescribed antibiotics recommended:  None - likely contaminant given other identified source of infection, monitor cultures and clinical status for now  Results for orders placed or performed during the hospital encounter of 09/23/18  Blood Culture ID Panel (Reflexed) (Collected: 09/23/2018  6:54 PM)  Result Value Ref Range   Enterococcus species NOT DETECTED NOT DETECTED   Listeria monocytogenes NOT DETECTED NOT DETECTED   Staphylococcus species NOT DETECTED NOT DETECTED   Staphylococcus aureus (BCID) NOT DETECTED NOT DETECTED   Streptococcus species DETECTED (A) NOT DETECTED   Streptococcus agalactiae NOT DETECTED NOT DETECTED   Streptococcus pneumoniae NOT DETECTED NOT DETECTED   Streptococcus pyogenes NOT DETECTED NOT DETECTED   Acinetobacter baumannii NOT DETECTED NOT DETECTED   Enterobacteriaceae species NOT DETECTED NOT DETECTED   Enterobacter cloacae complex NOT DETECTED NOT DETECTED   Escherichia coli NOT DETECTED NOT DETECTED   Klebsiella oxytoca NOT DETECTED NOT DETECTED   Klebsiella pneumoniae NOT DETECTED NOT DETECTED   Proteus species NOT DETECTED NOT DETECTED   Serratia marcescens NOT DETECTED NOT DETECTED   Haemophilus influenzae NOT DETECTED NOT DETECTED   Neisseria meningitidis NOT DETECTED NOT DETECTED   Pseudomonas aeruginosa NOT DETECTED NOT DETECTED   Candida albicans NOT DETECTED NOT DETECTED   Candida glabrata NOT DETECTED NOT DETECTED   Candida krusei NOT DETECTED NOT DETECTED    Candida parapsilosis NOT DETECTED NOT DETECTED   Candida tropicalis NOT DETECTED NOT DETECTED   Erin N. Gerarda Fraction, PharmD, [place] PGY2 Infectious Diseases Pharmacy Resident Phone: [phone] 09/24/2018  2:22 PM

## 2018-09-24 NOTE — H&P (Signed)
History and Physical    Kenneth [name] ZSW:[id number] DOB: 1956-02-03 DOA: 09/23/2018  PCP: [name], MD  Patient coming from: Home  I have personally briefly reviewed patient's old medical records in [place]  Chief Complaint: Sepsis, diarrhea  HPI: Kenneth [name] is a 63 y.o. 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  He was recently admitted to [facility] in late May after falls at home. He was found to have bilateral sacral alla fracture, S2 compression fracture, and L thumb dislocation. Physical therapy was recommended by orthopedics and he was discharged to [facility] senior care. While there, he was evaluated for dysphagia at [facility] and had endoscopy, which showed ulcerations in the mid and distal esophagus.  Seen yesterday at SNF by provider with c/o abd pain.  They found out that he also had 4 days of poor PO intake, diarrhea, as well as tachycardia, and hypotension at that time.  Patient sent in to ED for evaluation.   ED Course: In the ED he is initially put on cefepime / vanc / flagyl.  IVF bolus started but due to BPs running in the 32K systolic started on levophed initially as well.  Lactate 2.7.  Levophed titrated off after 2L IVF bolus improved his BP.  Creat 3.6, BUN 74.  Tm 102.  WBC 2.6.  HR now 130 down from 141 initially.  CXR, UA, LP all negative for infection.  C.Diff testing has come back positive.  PCCM consulted but BP improved so they deferred admission.   Review of Systems: As per HPI otherwise 10 point review of systems negative.   Past Medical History:  Diagnosis Date  . Achalasia   . Candida infection, esophageal ([place]) 08/2010   a. 2012 - noted on EGD.  Marland Kitchen DVT (deep venous thrombosis) ([place]) ~ 04/2014   ?RLE  . Dysphagia 2008   post heller myotomy/toupee fundoplication.    . ESRD (end stage renal disease) on dialysis [facility])    "MWF: [name]"  (01/22/2017)  . Fall 01/22/2017   mechanical fall w/multiple fractures/notes 01/22/2017  . GERD (gastroesophageal reflux disease)    hx (01/22/2017)  . Hemorrhoids, internal   . History of blood transfusion 03/2014   "low counts"  . History of gout   . History of stomach ulcers   . HIV infection ([place]) dx ~ 2009   a. 02/13/2014 CD4 = 240 - undetectable viral load.  . Hyperlipidemia   . Hypertension    hx (01/22/2017)  . Insomnia   . Lupus nephritis ([place])   . Pancytopenia ([place])   . Premature ventricular contractions   . Renal abscess   . SLE (systemic lupus erythematosus) ([place]) dx'd 2015    Past Surgical History:  Procedure Laterality Date  . BASCILIC VEIN TRANSPOSITION Left 06/29/2014   Procedure: FIRST STAGE  [place];  Surgeon: Rosetta Posner, MD;  Location: [place];  Service: Vascular;  Laterality: Left;  . BASCILIC VEIN TRANSPOSITION Left 09/11/2014   Procedure: SECOND STAGE BASILIC VEIN TRANSPOSITION LEFT UPPER ARM;  Surgeon: Rosetta Posner, MD;  Location: [place];  Service: Vascular;  Laterality: Left;  . COLONOSCOPY  2008   .  tortuous colon, internal hemorrhoids.  no polyps or diverticulosis.   Marland Kitchen ESOPHAGOGASTRODUODENOSCOPY  08/2010   Dr [name].  08/2010 candida esophagitis, no obvious esophageal stricture. 02/2006 and 05/2006 dilated esophagus, narrowed distal esophagus but  no stricture, was empirically Savary dilated  . ESOPHAGOGASTRODUODENOSCOPY N/A 06/27/2014   Procedure: ESOPHAGOGASTRODUODENOSCOPY (EGD);  Surgeon: [name], MD;  Location: [facility] ENDOSCOPY;  Service: Endoscopy;  Laterality: N/A;  . ESOPHAGOGASTRODUODENOSCOPY N/A 07/02/2014   Procedure: ESOPHAGOGASTRODUODENOSCOPY (EGD);  Surgeon: [name], MD;  Location: [facility] ENDOSCOPY;  Service: Endoscopy;  Laterality: N/A;  with botox  . [place]   with toupee fundoplication of HH.   . INSERTION OF DIALYSIS CATHETER Right 06/29/2014   Procedure: INSERTION OF DIALYSIS CATHETER RIGHT IJ;  Surgeon: Rosetta Posner, MD;  Location: [place];  Service: Vascular;  Laterality: Right;  . KIDNEY TRANSPLANT     January 2020 @ [facility]  . NEPHRECTOMY Right ~ 2011  . ORIF TIBIA PLATEAU Right 01/23/2017   Procedure: OPEN REDUCTION INTERNAL FIXATION (ORIF) TIBIAL PLATEAU; REPAIR OF LATERAL TIBIAL PLATEAU; ARTHROTOMY WITH MENISCUS REPAIR; ANTERIOR COMPARTMENT FASCIOTOMY;  Surgeon: Altamese [place] Gate, MD;  Location: [place];  Service: Orthopedics;  Laterality: Right;     reports that he has never smoked. He has never used smokeless tobacco. He reports that he does not drink alcohol or use drugs.  No Known Allergies  Family History  Problem Relation Age of Onset  . Colon cancer Father        deceased  . Other Mother        s/p pacemaker - alive and well.  . Hypertension Mother   . Other Other        5 brothers, 3 sisters - alive and well.     Prior to Admission medications   Medication Sig Start Date End Date Taking? Authorizing Provider  abacavir-dolutegravir-lamiVUDine (TRIUMEQ) 600-50-300 MG tablet Take 1 tablet by mouth daily. 03/21/18   Cokedale Callas, NP  amitriptyline (ELAVIL) 10 MG tablet Take 1 tablet (10 mg total) by mouth at bedtime. 02/02/17   Angiulli, Lavon Paganini, PA-C  aspirin 81 MG chewable tablet Chew 81 mg by mouth daily.    [provider]  calcitRIOL (ROCALTROL) 0.25 MCG capsule Take 0.25 mcg by mouth every Monday, Wednesday, and Friday. FOR ESRD WITH TRANSPLANT (DO NOT CRUSH)    [provider]  Cholecalciferol 50 MCG (2000 UT) TABS Take 2,000 Units by mouth daily.     [provider]  HYDROcodone-acetaminophen (NORCO) 7.5-325 MG tablet Take 1 tablet by mouth every 6 (six) hours as needed for moderate pain. 08/28/18   Hendricks Limes, MD  hydroxychloroquine (PLAQUENIL) 200 MG tablet Take 1 tablet (200 mg total) by mouth 2 (two) times daily. 02/02/17   Angiulli, Lavon Paganini, PA-C  metoprolol tartrate (LOPRESSOR) 25 MG tablet Take 12.5 mg by mouth 2 (two) times daily. Hold  for SBP <105    [provider]  mycophenolate (CELLCEPT) 250 MG capsule Take 250 mg by mouth every 12 (twelve) hours.  04/18/18   [provider]  Nutritional Supplement LIQD Take 120 mLs by mouth 2 (two) times a day. MedPass    [provider]  predniSONE (DELTASONE) 10 MG tablet Take 1 tablet (10 mg total) by mouth daily. 02/02/17   Angiulli, Lavon Paganini, PA-C  saccharomyces boulardii (FLORASTOR) 250 MG capsule Take 250 mg by mouth daily.  09/16/18 09/30/18  [provider]  sulfamethoxazole-trimethoprim (BACTRIM DS) 800-160 MG tablet Take 1 tablet by mouth every Monday, Wednesday, and Friday.    [provider]  tacrolimus (PROGRAF) 0.5 MG capsule Take 1.5 mg by mouth 2 (two) times daily.  08/06/18 08/01/19  [provider]  tamsulosin (FLOMAX) 0.4 MG CAPS capsule Take 0.04 mg by mouth daily. 07/17/18   [provider]    Physical Exam: Vitals:   09/24/18 0000 09/24/18 0020 09/24/18 0030 09/24/18 0110  BP: 113/64 111/64 124/70 124/69  Pulse:      Resp: (!) 28 (!) 30 (!) 29 (!) 34  Temp: (!) 100.9 F (38.3 C) (!) 101 F (38.3 C) (!) 101.1 F (38.4 C) (!) 101.2 F (38.4 C)  TempSrc:      SpO2:      Weight:        Constitutional: NAD, calm, ill appearing Eyes: PERRL, lids and conjunctivae normal ENMT: Mucous membranes are moist. Posterior pharynx clear of any exudate or lesions.Normal dentition.  Neck: normal, supple, no masses, no thyromegaly Respiratory: clear to auscultation bilaterally, no wheezing, no crackles. Normal respiratory effort. No accessory muscle use.  Cardiovascular: Tachycardia present Abdomen: no tenderness, no masses palpated. No hepatosplenomegaly. Bowel sounds positive.  Musculoskeletal: no clubbing / cyanosis. No joint deformity upper and lower extremities. Good ROM, no contractures. Normal muscle tone.  Skin: no rashes, lesions, ulcers. No induration Neurologic: CN 2-12 grossly intact. Sensation intact,  DTR normal. Strength 5/5 in all 4.  Psychiatric: Normal judgment and insight. Alert and oriented x 3. Normal mood.    Labs on Admission: I have personally reviewed following labs and imaging studies  CBC: Recent Labs  Lab 09/23/18 1854  WBC 2.6*  NEUTROABS 1.1*  HGB 15.2  HCT 44.8  MCV 92.4  PLT 546   Basic Metabolic Panel: Recent Labs  Lab 09/23/18 1854  NA 133*  K 4.5  CL 101  CO2 16*  GLUCOSE 141*  BUN 74*  CREATININE 3.68*  CALCIUM 8.4*  MG 2.7*   GFR: Estimated Creatinine Clearance: 15.8 mL/min (A) (by C-G formula based on SCr of 3.68 mg/dL (H)). Liver Function Tests: Recent Labs  Lab 09/23/18 1854  AST 25  ALT 13  ALKPHOS 90  BILITOT 0.6  PROT 6.4*  ALBUMIN 2.3*   Recent Labs  Lab 09/23/18 1854  LIPASE 19   No results for input(s): AMMONIA in the last 168 hours. Coagulation Profile: No results for input(s): INR, PROTIME in the last 168 hours. Cardiac Enzymes: No results for input(s): CKTOTAL, CKMB, CKMBINDEX, TROPONINI in the last 168 hours. BNP (last 3 results) No results for input(s): PROBNP in the last 8760 hours. HbA1C: No results for input(s): HGBA1C in the last 72 hours. CBG: No results for input(s): GLUCAP in the last 168 hours. Lipid Profile: No results for input(s): CHOL, HDL, LDLCALC, TRIG, CHOLHDL, LDLDIRECT in the last 72 hours. Thyroid Function Tests: No results for input(s): TSH, T4TOTAL, FREET4, T3FREE, THYROIDAB in the last 72 hours. Anemia Panel: No results for input(s): VITAMINB12, FOLATE, FERRITIN, TIBC, IRON, RETICCTPCT in the last 72 hours. Urine analysis:    Component Value Date/Time   COLORURINE AMBER (A) 09/23/2018 2030   APPEARANCEUR CLOUDY (A) 09/23/2018 2030   LABSPEC 1.024 09/23/2018 2030   PHURINE 5.0 09/23/2018 2030   GLUCOSEU NEGATIVE 09/23/2018 2030   GLUCOSEU NEG mg/dL 08/16/2006 2308   HGBUR NEGATIVE 09/23/2018 2030   BILIRUBINUR SMALL (A) 09/23/2018 2030   BILIRUBINUR neg 03/05/2014 1214   [place]  NEGATIVE 09/23/2018 2030   PROTEINUR 30 (A) 09/23/2018 2030   UROBILINOGEN 0.2 07/04/2014 0022   NITRITE NEGATIVE 09/23/2018 2030   LEUKOCYTESUR NEGATIVE 09/23/2018 2030    Radiological Exams on Admission: Dg Chest Port 1 View  Result Date: 09/23/2018 CLINICAL DATA:  63 year old  male with sepsis. EXAM: PORTABLE CHEST 1 VIEW COMPARISON:  Chest radiograph dated 01/22/2017 and CT dated 06/22/2014 FINDINGS: There is mild eventration of the left hemidiaphragm. There is no focal consolidation, pleural effusion, or pneumothorax. Mild prominence of the pulmonary vasculature with cephalization may represent mild congestion. Apparent 9 mm nodular density over the posterior right sixth rib likely an area of old healed fracture. A pulmonary nodule is less likely. Nonemergent CT may provide better evaluation if clinically indicated. Additional old healed right posterior rib fractures noted. There is a 5 mm nodular density over the left mid lung field, possibly a granuloma. The cardiac silhouette is within normal limits. There is dextroscoliosis of the thoracic spine. No acute osseous pathology. IMPRESSION: 1. No focal consolidation. 2. Possible mild vascular congestion. 3. Probable focus of old healed rib fracture versus less likely a 9 mm right pulmonary nodule. CT may provide better evaluation on a nonemergent basis. Electronically Signed   By: Anner Crete M.D.   On: 09/23/2018 19:11    EKG: Independently reviewed.  Assessment/Plan Principal Problem:   Severe sepsis (HCC) Active Problems:   HIV disease ([place])   AKI (acute kidney injury) ([place])   SLE (systemic lupus erythematosus) (HCC)   History of renal transplant   Current chronic use of systemic steroids   C. difficile diarrhea    1. Severe sepsis - appears to be due to C.Diff diarrhea 1. IVF: 3.5L bolus in ED, then NS at 125 cc/hr 2. Changing ABx to: PO vanc and IV flagyl 3. CT abd/pelvis as recd by PCCM 4. Admitting to SDU 5. Serial  lactates 2. Chronic steroid use - 1. Will put patient on stress dose steroids 3. AKI - 1. Likely pre-renal due to hypotension / dehydration / sepsis 2. IVF as above 3. Strict intake and output 4. Repeat BMP in AM 4. HIV - 1. Continue HAART 5. SLE - 1. Continue hydroxychloroquine 2. Stress dose steroids 6. H/o Renal transplant - 1. Continue celcept and prograf 2. Stress dose steroids as above 3. Think that the AKI is more likely due to dehydration / pre-renal from the hypotension and sepsis than an acute rejection reaction at this point. 4. Holding calcitriol due to slightly low calcium today.  DVT prophylaxis: Heparin [place] Code Status: Full Family Communication: No family in room Disposition Plan: Home after admit Consults called: PCCM Admission status: Admit to inpatient  Severity of Illness: The appropriate patient status for this patient is INPATIENT. Inpatient status is judged to be reasonable and necessary in order to provide the required intensity of service to ensure the patient's safety. The patient's presenting symptoms, physical exam findings, and initial radiographic and laboratory data in the context of their chronic comorbidities is felt to place them at high risk for further clinical deterioration. Furthermore, it is not anticipated that the patient will be medically stable for discharge from the hospital within 2 midnights of admission. The following factors support the patient status of inpatient.   IP status for treatment of sepsis with hypotension, AKI with creat 3.6 up from 1.1 baseline.   * I certify that at the point of admission it is my clinical judgment that the patient will require inpatient hospital care spanning beyond 2 midnights from the point of admission due to high intensity of service, high risk for further deterioration and high frequency of surveillance required.*    Theran Vandergrift M. DO [facility]  How to contact the [facility] Attending or  Consulting provider 7A -  7P or covering provider during after hours [place], for this patient?  1. Check the care team in [facility] and look for a) attending/consulting TR[facility] provider listed and b) the [facility] team listed 2. Log into [email or web address]  Amion Physician Scheduling and messaging for groups and whole hospitals  On call and physician scheduling software for group practices, residents, hospitalists and other medical providers for call, clinic, rotation and shift schedules. On[employer] is a hospital-wide system for scheduling doctors and paging doctors on call. EasyPlot is for scientific plotting and data analysis.  [email or web address]  and use [place]'s universal password to access. If you do not have the password, please contact the hospital operator.  3. Locate the [facility] provider you are looking for under [facility] and page to a number that you can be directly reached. 4. If you still have difficulty reaching the provider, please page the [facility] (Director on Call) for the Hospitalists listed on amion for assistance.  09/24/2018, 1:15 AM

## 2018-09-24 NOTE — ED Notes (Signed)
Report given to 3E RN

## 2018-09-24 NOTE — Sepsis Progress Note (Addendum)
Notified bedside nurse of need to draw lactic acid.  

## 2018-09-25 ENCOUNTER — Inpatient Hospital Stay (HOSPITAL_COMMUNITY): Payer: Medicare Other

## 2018-09-25 DIAGNOSIS — N170 Acute kidney failure with tubular necrosis: Secondary | ICD-10-CM

## 2018-09-25 DIAGNOSIS — E43 Unspecified severe protein-calorie malnutrition: Secondary | ICD-10-CM

## 2018-09-25 LAB — BASIC METABOLIC PANEL
Anion gap: 11 (ref 5–15)
BUN: 91 mg/dL — ABNORMAL HIGH (ref 8–23)
CO2: 13 mmol/L — ABNORMAL LOW (ref 22–32)
Calcium: 6.7 mg/dL — ABNORMAL LOW (ref 8.9–10.3)
Chloride: 109 mmol/L (ref 98–111)
Creatinine, Ser: 2.99 mg/dL — ABNORMAL HIGH (ref 0.61–1.24)
GFR calc Af Amer: 25 mL/min — ABNORMAL LOW (ref >60)
GFR calc non Af Amer: 22 mL/min — ABNORMAL LOW (ref >60)
Glucose, Bld: 109 mg/dL — ABNORMAL HIGH (ref 70–99)
Potassium: 4.3 mmol/L (ref 3.5–5.1)
Sodium: 133 mmol/L — ABNORMAL LOW (ref 135–145)

## 2018-09-25 LAB — CBC
HCT: 36.8 % — ABNORMAL LOW (ref 39.0–52.0)
Hemoglobin: 12.9 g/dL — ABNORMAL LOW (ref 13.0–17.0)
MCH: 31.1 pg (ref 26.0–34.0)
MCHC: 35.1 g/dL (ref 30.0–36.0)
MCV: 88.7 fL (ref 80.0–100.0)
Platelets: 199 K/uL (ref 150–400)
RBC: 4.15 MIL/uL — ABNORMAL LOW (ref 4.22–5.81)
RDW: 13.7 % (ref 11.5–15.5)
WBC: 2.9 K/uL — ABNORMAL LOW (ref 4.0–10.5)
nRBC: 0 % (ref 0.0–0.2)

## 2018-09-25 MED ORDER — ASPIRIN 81 MG PO CHEW
162.0000 mg | CHEWABLE_TABLET | Freq: Once | ORAL | Status: AC
  Administered 2018-09-25: 23:00:00 162 mg via ORAL
  Filled 2018-09-25: qty 2

## 2018-09-25 MED ORDER — ACYCLOVIR 400 MG PO TABS
200.0000 mg | ORAL_TABLET | Freq: Three times a day (TID) | ORAL | Status: DC
  Administered 2018-09-25 – 2018-09-28 (×11): 200 mg via ORAL
  Filled 2018-09-25 (×12): qty 1

## 2018-09-25 MED ORDER — ALUM & MAG HYDROXIDE-SIMETH 200-200-20 MG/5ML PO SUSP
15.0000 mL | ORAL | Status: DC | PRN
  Administered 2018-09-25: 15 mL via ORAL
  Filled 2018-09-25: qty 30

## 2018-09-25 MED ORDER — MORPHINE SULFATE (PF) 2 MG/ML IV SOLN
2.0000 mg | Freq: Once | INTRAVENOUS | Status: AC
  Administered 2018-09-25: 2 mg via INTRAVENOUS
  Filled 2018-09-25: qty 1

## 2018-09-25 MED ORDER — PREDNISONE 10 MG PO TABS
10.0000 mg | ORAL_TABLET | Freq: Every day | ORAL | Status: DC
  Administered 2018-09-25 – 2018-09-28 (×4): 10 mg via ORAL
  Filled 2018-09-25 (×4): qty 1

## 2018-09-25 MED ORDER — SODIUM BICARBONATE 8.4 % IV SOLN
INTRAVENOUS | Status: DC
  Administered 2018-09-25 – 2018-09-27 (×4): via INTRAVENOUS
  Filled 2018-09-25 (×6): qty 150

## 2018-09-25 MED ORDER — ENSURE ENLIVE PO LIQD
237.0000 mL | Freq: Three times a day (TID) | ORAL | Status: DC
  Administered 2018-09-25 – 2018-09-28 (×7): 237 mL via ORAL

## 2018-09-25 MED ORDER — ASPIRIN 81 MG PO CHEW: 324.0000 mg | CHEWABLE_TABLET | Freq: Once | ORAL | Status: DC

## 2018-09-25 MED ORDER — TAMSULOSIN HCL 0.4 MG PO CAPS
0.4000 mg | ORAL_CAPSULE | Freq: Every day | ORAL | Status: DC
  Administered 2018-09-25 – 2018-09-28 (×4): 0.4 mg via ORAL
  Filled 2018-09-25 (×4): qty 1

## 2018-09-25 MED ORDER — TACROLIMUS 1 MG PO CAPS
1.5000 mg | ORAL_CAPSULE | Freq: Two times a day (BID) | ORAL | Status: DC
  Administered 2018-09-25 – 2018-09-26 (×3): 1.5 mg via ORAL
  Filled 2018-09-25 (×4): qty 1

## 2018-09-25 MED ORDER — SODIUM CHLORIDE 0.9 % IV BOLUS
500.0000 mL | Freq: Once | INTRAVENOUS | Status: AC
  Administered 2018-09-25: 500 mL via INTRAVENOUS

## 2018-09-25 MED ORDER — ADULT MULTIVITAMIN W/MINERALS CH
1.0000 | ORAL_TABLET | Freq: Every day | ORAL | Status: DC
  Administered 2018-09-25 – 2018-09-28 (×3): 1 via ORAL
  Filled 2018-09-25 (×4): qty 1

## 2018-09-25 MED ORDER — SODIUM CHLORIDE 0.9 % IV SOLN
2.0000 g | INTRAVENOUS | Status: DC
  Administered 2018-09-25 – 2018-09-26 (×2): 2 g via INTRAVENOUS
  Filled 2018-09-25 (×2): qty 2

## 2018-09-25 NOTE — Progress Notes (Signed)
Pt is having chest pain. Pt denies it radiating. Pt rates pain a 9 out of 10. Vitals taken, EKG taken, MD notified. Pt states it could be heartburn from drinking orange juice. Will continue to monitor pt.

## 2018-09-25 NOTE — Progress Notes (Addendum)
PROGRESS NOTE                                                                                                                                                                                                             Patient Demographics:    Kenneth [name], is a 63 y.o. male, DOB - Jan 06, 1956, XBD:[id number]  Admit date - 09/23/2018   Admitting Physician [name], MD  Outpatient Primary MD for the patient is [name], MD  LOS - 1  Outpatient Specialists: Nephrology at [facility] Complaint  Patient presents with  . tachycardia  . 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 intake and 4-day history of diarrhea.  Patient was severely septic with fever, hypotension and acute kidney injury on presentation.  He was tested positive for C. difficile.  He even required vasopressors but quickly improved with 2 L IV fluid bolus.  CT of the abdomen pelvis showed pancolitis and several new fractures involving greater trochanter of the right femur, sacral fracture and L5 fracture (patient has had sacral fracture when he was hospitalized in May and orthopedics recommended weightbearing and nonsurgical intervention). Patient admitted to stepdown for severe sepsis and ATN secondary to C. difficile pancolitis.   Subjective:   Patient reported having 3 episodes of watery diarrhea yesterday (last episode last night).  Complains of pain over the left lower lip.  Denies abdominal pain.  Assessment  & Plan :    Principal Problem:   Severe sepsis ([place]) Secondary to C. difficile pancolitis.  Not sure if he was treated with antibiotic recently.  Blood culture 1/3 growing strep (sensitivity  pending).  Patient currently on oral vancomycin and IV Flagyl.  Continue IV fluids with normal saline at 100 cc/h.  Afebrile and blood pressure slowly improving but still tachycardic in the low 100s.  Lactic acid normalized.  Patient slowly recovering from sepsis. COVID-19 tested negative.  Active Problems: Acute metabolic encephalopathy (HCC) Suspect secondary to severe sepsis.  Underwent LP which was unremarkable.  Mental status now appears to be at baseline.  ?strep bacteremia 1/3 blood  culture growing strep species.  Received IV cefepime last 2 days.  Resume his cefepime until final sensitivity available.  Right transverse process L5 fracture along with sacral fractures MRI done shows acute to subacute bilateral sacral alar fracture stable from prior CT.  New acute or subacute nondisplaced fracture of the bilateral transfer process of L5, mild diffuse soft tissue edema of the lower lumbar and sacral spine.  Central disc protrusion at L4-L5 with mild bilateral lateral recess stenosis. Orthopedics consulted.    HIV AIDS. CD4 of 45.  Was 266 months ago.  Not sure if patient is adherent to his ART, resumed.  Acute tubular necrosis (HCC) Suspect prerenal with severe sepsis/C. difficile colitis.  Continue IV hydration.  Although there is slight improvement in creatinine from admission (3.68>> 2.99).  Patient still needs IV hydration.  Consult nephrology.  Avoid nephrotoxins.  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 On CellCept, Prograf and Plaquenil.  Holding Bactrim ( resume once renal function further improved).  Nephrology consulted.    Current chronic use of systemic steroids Was placed on IV hydrocortisone for sepsis, resume back to prednisone.  Oral ulcers Continue Magic mouthwash.  Will place on acyclovir.  History of mid and lower esophageal ulceration (as per endoscopy done at [facility] recently) On nystatin   Severe protein calorie malnutrition ([place]) Physical deconditioning Nutrition and PT consulted.  Discontinue Foley      Code Status : Full code  Family Communication  : None at bedside  Disposition Plan  : Possibly needs to return to SNF once improved  Barriers For Discharge : Active symptoms  Consults  : Nephrology  Procedures  : CT abdomen, MRI lumbar spine  DVT Prophylaxis  : Heparin  Lab Results  Component Value Date   PLT 199 09/25/2018    Antibiotics  :    Anti-infectives (From admission, onward)   Start     Dose/Rate Route Frequency Ordered Stop   09/25/18 1000  sulfamethoxazole-trimethoprim (BACTRIM DS) 800-160 MG per tablet 1 tablet     1 tablet Oral Every M-W-F 09/24/18 0045     09/24/18 1900  ceFEPIme (MAXIPIME) 1 g in sodium chloride 0.9 % 100 mL IVPB  Status:  Discontinued     1 g 200 mL/hr over 30 Minutes Intravenous Every 24 hours 09/23/18 1946 09/24/18 0115   09/24/18 1000  hydroxychloroquine (PLAQUENIL) tablet 200 mg     200 mg Oral 2 times daily 09/24/18 0045     09/24/18 1000  abacavir-dolutegravir-lamiVUDine (TRIUMEQ) 600-50-300 MG per tablet 1 tablet     1 tablet Oral Daily 09/24/18 0045     09/24/18 0600  vancomycin (VANCOCIN) 50 mg/mL oral solution 500 mg     500 mg Oral 3 times daily before meals & bedtime 09/24/18 0055 10/08/18 0759   09/24/18 0200  metroNIDAZOLE (FLAGYL) IVPB 500 mg     500 mg 100 mL/hr over 60 Minutes Intravenous Every 8 hours 09/24/18 0055 10/08/18 0159   09/23/18 1900  ceFEPIme (MAXIPIME) 2 g in sodium chloride 0.9 % 100 mL IVPB     2 g 200 mL/hr over 30 Minutes Intravenous  Once 09/23/18 1856 09/23/18 2011   09/23/18 1900  vancomycin (VANCOCIN) IVPB 1000 mg/200 mL premix     1,000 mg 200 mL/hr over 60 Minutes Intravenous  Once 09/23/18 1856 09/23/18 2144        Objective:   Vitals:   09/24/18 2107 09/25/18 0135  09/25/18 0600 09/25/18 0605  BP: 129/73 126/66  126/72  Pulse:  (!) 101  (!) 115  Resp: 20 20  20    Temp: 98.1 F (36.7 C) 97.9 F (36.6 C)  98.1 F (36.7 C)  TempSrc: Oral Oral  Oral  SpO2: 100% 100%  100%  Weight:   52.5 kg   Height:        Wt Readings from Last 3 Encounters:  09/25/18 52.5 kg  09/20/18 53.4 kg  09/19/18 53.4 kg     Intake/Output Summary (Last 24 hours) at 09/25/2018 0919 Last data filed at 09/25/2018 0600 Gross per 24 hour  Intake 120 ml  Output 300 ml  Net -180 ml     Physical Exam  Gen: not in distress, fatigue HEENT: Dry mucosa, neck, no pallor Chest: Clear to auscultation bilaterally CVs: S1-S2 tachycardic, no murmurs rub or gallop GI:, Nondistended, nontender, bowel sounds present Musculoskeletal: Warm, Foley + CNS: Alert and oriented    Data Review:    CBC Recent Labs  Lab 09/23/18 1854 09/24/18 0408 09/25/18 0726  WBC 2.6* 2.5* 2.9*  HGB 15.2 13.7 12.9*  HCT 44.8 40.2 36.8*  PLT 273 203 199  MCV 92.4 91.6 88.7  MCH 31.3 31.2 31.1  MCHC 33.9 34.1 35.1  RDW 13.6 13.6 13.7  LYMPHSABS 0.8  --   --   MONOABS 0.3  --   --   EOSABS 0.0  --   --   BASOSABS 0.0  --   --     Chemistries  Recent Labs  Lab 09/23/18 1854 09/24/18 0408 09/25/18 0726  NA 133* 131* 133*  K 4.5 4.6 4.3  CL 101 103 109  CO2 16* 14* 13*  GLUCOSE 141* 104* 109*  BUN 74* 77* 91*  CREATININE 3.68* 3.51* 2.99*  CALCIUM 8.4* 7.6* 6.7*  MG 2.7*  --   --   AST 25  --   --   ALT 13  --   --   ALKPHOS 90  --   --   BILITOT 0.6  --   --    ------------------------------------------------------------------------------------------------------------------ No results for input(s): CHOL, HDL, LDLCALC, TRIG, CHOLHDL, LDLDIRECT in the last 72 hours.  No results found for: HGBA1C ------------------------------------------------------------------------------------------------------------------ No results for input(s): TSH, T4TOTAL, T3FREE, THYROIDAB in the last 72 hours.  Invalid input(s): FREET3  ------------------------------------------------------------------------------------------------------------------ No results for input(s): VITAMINB12, FOLATE, FERRITIN, TIBC, IRON, RETICCTPCT in the last 72 hours.  Coagulation profile No results for input(s): INR, PROTIME in the last 168 hours.  No results for input(s): DDIMER in the last 72 hours.  Cardiac Enzymes No results for input(s): CKMB, TROPONINI, MYOGLOBIN in the last 168 hours.  Invalid input(s): CK ------------------------------------------------------------------------------------------------------------------    Component Value Date/Time   BNP 176.0 (H) 09/23/2018 1824    Inpatient Medications  Scheduled Meds: . abacavir-dolutegravir-lamiVUDine  1 tablet Oral Daily  . amitriptyline  10 mg Oral QHS  . aspirin  81 mg Oral Daily  . cholecalciferol  2,000 Units Oral Daily  . heparin  5,000 Units Subcutaneous Q8H  . hydroxychloroquine  200 mg Oral BID  . magic mouthwash  5 mL Oral TID  . mycophenolate  250 mg Oral Q12H  . nystatin  5 mL Oral QID  . predniSONE  10 mg Oral Daily  . saccharomyces boulardii  250 mg Oral Daily  . sulfamethoxazole-trimethoprim  1 tablet Oral Q M,W,F  . tacrolimus  1.5 mg Oral BID  . vancomycin  500 mg Oral TID AC &  HS   Continuous Infusions: . sodium chloride 125 mL/hr at 09/24/18 1134  . metronidazole 500 mg (09/25/18 0839)   PRN Meds:.acetaminophen **OR** acetaminophen, HYDROcodone-acetaminophen, ondansetron **OR** ondansetron (ZOFRAN) IV  Micro Results Recent Results (from the past 240 hour(s))  Novel Coronavirus, NAA ([facility])     Status: None   Collection Time: 09/16/18 12:00 AM  Result Value Ref Range Status   SARS-CoV-2, NAA *Not Detected   Final  Blood Culture (routine x 2)     Status: None (Preliminary result)   Collection Time: 09/23/18  6:54 PM   Specimen: BLOOD  Result Value Ref Range Status   Specimen Description BLOOD SITE NOT SPECIFIED  Final   Special Requests    Final    BOTTLES DRAWN AEROBIC AND ANAEROBIC Blood Culture adequate volume   Culture  Setup Time   Final    GRAM POSITIVE COCCI IN PAIRS AND CHAINS ANAEROBIC BOTTLE ONLY CRITICAL RESULT CALLED TO, READ BACK BY AND VERIFIED WITH: A. Love PharmD 14:20 09/24/18 ([email or web address]) Performed at [facility], [place] [address], [place], [place] [address]    Culture GRAM POSITIVE COCCI  Final   Report Status PENDING  Incomplete  Blood Culture (routine x 2)     Status: None (Preliminary result)   Collection Time: 09/23/18  6:54 PM   Specimen: Site Not Specified; Blood  Result Value Ref Range Status   Specimen Description SITE NOT SPECIFIED  Final   Special Requests   Final    BOTTLES DRAWN AEROBIC ONLY Blood Culture adequate volume   Culture   Final    NO GROWTH < 24 HOURS Performed at [facility], [place] [address] [place], [place] [address]    Report Status PENDING  Incomplete  Blood Culture ID Panel (Reflexed)     Status: Abnormal   Collection Time: 09/23/18  6:54 PM  Result Value Ref Range Status   Enterococcus species NOT DETECTED NOT DETECTED Final   Listeria monocytogenes NOT DETECTED NOT DETECTED Final   Staphylococcus species NOT DETECTED NOT DETECTED Final   Staphylococcus aureus (BCID) NOT DETECTED NOT DETECTED Final   Streptococcus species DETECTED (A) NOT DETECTED Final    Comment: Not Enterococcus species, Streptococcus agalactiae, Streptococcus pyogenes, or Streptococcus pneumoniae. CRITICAL RESULT CALLED TO, READ BACK BY AND VERIFIED WITH: A. Love PharmD 14:20 09/24/18 ([email or web address])    Streptococcus agalactiae NOT DETECTED NOT DETECTED Final   Streptococcus pneumoniae NOT DETECTED NOT DETECTED Final   Streptococcus pyogenes NOT DETECTED NOT DETECTED Final   Acinetobacter baumannii NOT DETECTED NOT DETECTED Final   Enterobacteriaceae species NOT DETECTED NOT DETECTED Final   Enterobacter cloacae complex NOT DETECTED NOT DETECTED Final   Escherichia coli NOT DETECTED NOT  DETECTED Final   Klebsiella oxytoca NOT DETECTED NOT DETECTED Final   Klebsiella pneumoniae NOT DETECTED NOT DETECTED Final   Proteus species NOT DETECTED NOT DETECTED Final   Serratia marcescens NOT DETECTED NOT DETECTED Final   Haemophilus influenzae NOT DETECTED NOT DETECTED Final   Neisseria meningitidis NOT DETECTED NOT DETECTED Final   Pseudomonas aeruginosa NOT DETECTED NOT DETECTED Final   Candida albicans NOT DETECTED NOT DETECTED Final   Candida glabrata NOT DETECTED NOT DETECTED Final   Candida krusei NOT DETECTED NOT DETECTED Final   Candida parapsilosis NOT DETECTED NOT DETECTED Final   Candida tropicalis NOT DETECTED NOT DETECTED Final    Comment: Performed at [facility], [place] [address] [place], [place] [address]  SARS Coronavirus 2 (CEPHEID -  Performed in [facility] hospital lab), Hosp Order     Status: None   Collection Time: 09/23/18  7:04 PM   Specimen: Nasopharyngeal Swab  Result Value Ref Range Status   SARS Coronavirus 2 NEGATIVE NEGATIVE Final    Comment: (NOTE) If result is NEGATIVE SARS-CoV-2 target nucleic acids are NOT DETECTED. The SARS-CoV-2 RNA is generally detectable in upper and lower  respiratory specimens during the acute phase of infection. The lowest  concentration of SARS-CoV-2 viral copies this assay can detect is 250  copies / mL. A negative result does not preclude SARS-CoV-2 infection  and should not be used as the sole basis for treatment or other  patient management decisions.  A negative result may occur with  improper specimen collection / handling, submission of specimen other  than nasopharyngeal swab, presence of viral mutation(s) within the  areas targeted by this assay, and inadequate number of viral copies  (<250 copies / mL). A negative result must be combined with clinical  observations, patient history, and epidemiological information. If result is POSITIVE SARS-CoV-2 target nucleic acids are DETECTED. The SARS-CoV-2  RNA is generally detectable in upper and lower  respiratory specimens dur ing the acute phase of infection.  Positive  results are indicative of active infection with SARS-CoV-2.  Clinical  correlation with patient history and other diagnostic information is  necessary to determine patient infection status.  Positive results do  not rule out bacterial infection or co-infection with other viruses. If result is PRESUMPTIVE POSTIVE SARS-CoV-2 nucleic acids MAY BE PRESENT.   A presumptive positive result was obtained on the submitted specimen  and confirmed on repeat testing.  While 2019 novel coronavirus  (SARS-CoV-2) nucleic acids may be present in the submitted sample  additional confirmatory testing may be necessary for epidemiological  and / or clinical management purposes  to differentiate between  SARS-CoV-2 and other Sarbecovirus currently known to infect humans.  If clinically indicated additional testing with an alternate test  methodology [phone]) is advised. The SARS-CoV-2 RNA is generally  detectable in upper and lower respiratory sp ecimens during the acute  phase of infection. The expected result is Negative. Fact Sheet for Patients:  StrictlyIdeas.no Fact Sheet for Healthcare Providers: BankingDealers.co.za This test is not yet approved or cleared by the [place] FDA and has been authorized for detection and/or diagnosis of SARS-CoV-2 by FDA under an Emergency Use Authorization (EUA).  This EUA will remain in effect (meaning this test can be used) for the duration of the COVID-19 declaration under Section 564(b)(1) of the Act, 21 U.S.C. section 360bbb-3(b)(1), unless the authorization is terminated or revoked sooner. Performed at [facility], [place] [address], [place], [place] [address]   Urine culture     Status: None   Collection Time: 09/23/18  8:35 PM   Specimen: Urine, Catheterized  Result Value Ref Range  Status   Specimen Description URINE, CATHETERIZED  Final   Special Requests NONE  Final   Culture   Final    NO GROWTH Performed at [facility], [place] [address] [place], [place] [address]    Report Status 09/24/2018 FINAL  Final  C difficile quick scan w PCR reflex     Status: Abnormal   Collection Time: 09/23/18  8:35 PM   Specimen: Urine, Catheterized; Stool  Result Value Ref Range Status   C Diff antigen POSITIVE (A) NEGATIVE Final   C Diff toxin POSITIVE (A) NEGATIVE Final   C Diff interpretation  Toxin producing C. difficile detected.  Final    Comment: CRITICAL RESULT CALLED TO, READ BACK BY AND VERIFIED WITH: A DENNIS RN 09/24/18 0015 JDW Performed at [facility], [address], [place], [place] [address]   Gastrointestinal Panel by PCR , Stool     Status: None   Collection Time: 09/23/18  8:35 PM   Specimen: Stool  Result Value Ref Range Status   Campylobacter species NOT DETECTED NOT DETECTED Final   Plesimonas shigelloides NOT DETECTED NOT DETECTED Final   Salmonella species NOT DETECTED NOT DETECTED Final   Yersinia enterocolitica NOT DETECTED NOT DETECTED Final   Vibrio species NOT DETECTED NOT DETECTED Final   Vibrio cholerae NOT DETECTED NOT DETECTED Final   Enteroaggregative E coli (EAEC) NOT DETECTED NOT DETECTED Final   Enteropathogenic E coli (EPEC) NOT DETECTED NOT DETECTED Final   Enterotoxigenic E coli (ETEC) NOT DETECTED NOT DETECTED Final   Shiga like toxin producing E coli (STEC) NOT DETECTED NOT DETECTED Final   Shigella/Enteroinvasive E coli (EIEC) NOT DETECTED NOT DETECTED Final   Cryptosporidium NOT DETECTED NOT DETECTED Final   Cyclospora cayetanensis NOT DETECTED NOT DETECTED Final   Entamoeba histolytica NOT DETECTED NOT DETECTED Final   Giardia lamblia NOT DETECTED NOT DETECTED Final   Adenovirus F40/41 NOT DETECTED NOT DETECTED Final   Astrovirus NOT DETECTED NOT DETECTED Final   Norovirus GI/GII NOT DETECTED NOT DETECTED Final    Rotavirus A NOT DETECTED NOT DETECTED Final   Sapovirus (I, II, IV, and V) NOT DETECTED NOT DETECTED Final    Comment: Performed at [facility], [address] [place], [place] [address]  CSF culture     Status: None (Preliminary result)   Collection Time: 09/23/18 10:12 PM   Specimen: CSF; Cerebrospinal Fluid  Result Value Ref Range Status   Specimen Description CSF  Final   Special Requests NONE  Final   Gram Stain NO WBC SEEN NO ORGANISMS SEEN CYTOSPIN SMEAR   Final   Culture   Final    NO GROWTH 2 DAYS Performed at [facility], [address] [place], [place] [address]    Report Status PENDING  Incomplete    Radiology Reports Ct Abdomen Pelvis Wo Contrast  Result Date: 09/24/2018 CLINICAL DATA:  63 year old male with diarrhea and sepsis. History of renal abscess and HIV. EXAM: CT ABDOMEN AND PELVIS WITHOUT CONTRAST TECHNIQUE: Multidetector CT imaging of the abdomen and pelvis was performed following the standard protocol without IV contrast. COMPARISON:  CT of the pelvis dated 08/25/2018 and CT of the abdomen pelvis dated 06/22/2014 FINDINGS: Evaluation of this exam is limited in the absence of intravenous contrast. Evaluation is also limited due to cachexia. Lower chest: The visualized lung bases are clear. No intra-abdominal free air. Probable mesenteric edema. Hepatobiliary: The liver is grossly unremarkable. Layering stones noted within the gallbladder. No pericholecystic fluid. Pancreas: The pancreas is grossly unremarkable as visualized. Spleen: Normal in size without focal abnormality. Adrenals/Urinary Tract: The adrenal glands are grossly unremarkable. Atrophic left kidney. No hydronephrosis. 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 There is a punctate stone in the region of the transplant hilum. Mild fullness of the renal transplant collecting system. No peritransplant fluid collection. The urinary bladder is decompressed around a Foley catheter and  contains a small pocket of air. Stomach/Bowel: There is diffuse inflammatory changes and thickening of the colon and rectosigmoid consistent with pancolitis. Correlation with clinical exam and stool cultures recommended. No bowel  obstruction. Surgical clips in the upper abdomen noted. The appendix is not visualized. Vascular/Lymphatic: The abdominal aorta and IVC are grossly unremarkable on this noncontrast CT. No portal venous gas. Evaluation of the lymph nodes is very limited. No obvious adenopathy. Reproductive: The prostate gland is poorly visualized. Other: Severe cachexia. Musculoskeletal: Nondisplaced fracture of the greater trochanter of the right femur. 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 Old left pubic bone fractures. Fractures of the sacral alum as seen on the prior CT with no healing. Nondisplaced fracture of the right L5 transverse process. IMPRESSION: 1. Pancolitis. Correlation with clinical exam and stool cultures recommended. No bowel obstruction. 2. Cholelithiasis. 3. Right lower quadrant renal transplant. No hydronephrosis. 4. Fractures of the greater trochanter of the right femur, right transverse process of L5, sacrum, and left pubic bone. 5. Cachexia. Electronically Signed   By: Anner Crete M.D.   On: 09/24/2018 03:44   Mr Lumbar Spine Wo Contrast  Result Date: 09/24/2018 CLINICAL DATA:  Initial evaluation for acute sacral fractures. EXAM: MRI LUMBAR SPINE WITHOUT CONTRAST TECHNIQUE: Multiplanar, multisequence MR imaging of the lumbar spine was performed. No intravenous contrast was administered. COMPARISON:  Prior CT from earlier the same day. FINDINGS: Segmentation: Standard. Lowest well-formed disc labeled the L5-S1 level. Alignment: Mild levoscoliosis. Alignment otherwise normal with preservation of the normal lumbar lordosis. No listhesis or subluxation. Vertebrae: Abnormal marrow edema seen involving the left greater than right sacral ala, compatible with  acute to subacute nondisplaced fractures. Findings stable from previous CT. Subtle linear signal intensity extending through the transverse processes of L5 also likely reflect acute to subacute nondisplaced fractures, also seen on prior CT. Vertebral body heights maintained with no other acute or chronic fracture identified. Bone marrow signal intensity diffusely decreased on T1 weighted imaging, most likely related to patient's underlying renal disease and anemia. No discrete or worrisome osseous lesions. No other abnormal marrow edema. Conus medullaris and cauda equina: Conus extends to the L1 level. Conus and cauda equina appear normal. Paraspinal and other soft tissues: Soft tissue edema seen about the lower lumbar spine and sacrum, likely posttraumatic in nature. No discrete hematoma or other collection. Changes compatible with acute colitis noted about the visualized descending colon. Right lower quadrant renal transplant partially visualized. Mild scattered free fluid noted within the visualized abdomen and pelvis. Disc levels: L1-2:  Unremarkable. L2-3:  Unremarkable. L3-4:  Unremarkable. L4-5: Mild diffuse disc bulge with disc desiccation. Superimposed broad-based central disc protrusion mildly indents the ventral thecal sac. Mild facet hypertrophy. Resultant mild bilateral lateral recess stenosis without neural impingement. Foramina remain patent. L5-S1: Chronic intervertebral disc space narrowing with diffuse disc bulge and disc desiccation. Superimposed central disc protrusion with slight inferior angulation. Protruding disc indents the ventral thecal sac, slightly asymmetric to the right. Protruding disc closely approximates the descending S1 nerve roots without frank neural impingement, also greater on the right. Central canal remains patent. Mild bilateral L5 foraminal stenosis without impingement. IMPRESSION: 1. Acute to subacute bilateral sacral alar fractures, stable from prior CT. 2. Probable  additional acute to subacute nondisplaced fractures of the bilateral transverse processes of L5, better seen on prior CT. 3. Mild diffuse soft tissue edema about the lower lumbar spine and sacrum, likely posttraumatic/reactive in nature. No discrete hematoma or other collection. 4. Small central disc protrusion at L5-S1, closely approximating the right greater than left descending S1 nerve roots without frank impingement. 5. Central disc protrusion at L4-5 with resultant mild bilateral lateral recess stenosis.  No frank impingement. 6. Findings consistent with acute colitis about the visualized descending colon, better seen on prior CT. Electronically Signed   By: Jeannine Boga M.D.   On: 09/24/2018 17:02   Dg Chest Port 1 View  Result Date: 09/23/2018 CLINICAL DATA:  63 year old male with sepsis. EXAM: PORTABLE CHEST 1 VIEW COMPARISON:  Chest radiograph dated 01/22/2017 and CT dated 06/22/2014 FINDINGS: There is mild eventration of the left hemidiaphragm. There is no focal consolidation, pleural effusion, or pneumothorax. Mild prominence of the pulmonary vasculature with cephalization may represent mild congestion. Apparent 9 mm nodular density over the posterior right sixth rib likely an area of old healed fracture. A pulmonary nodule is less likely. Nonemergent CT may provide better evaluation if clinically indicated. Additional old healed right posterior rib fractures noted. There is a 5 mm nodular density over the left mid lung field, possibly a granuloma. The cardiac silhouette is within normal limits. There is dextroscoliosis of the thoracic spine. No acute osseous pathology. IMPRESSION: 1. No focal consolidation. 2. Possible mild vascular congestion. 3. Probable focus of old healed rib fracture versus less likely a 9 mm right pulmonary nodule. CT may provide better evaluation on a nonemergent basis. Electronically Signed   By: Anner Crete M.D.   On: 09/23/2018 19:11    Time Spent in minutes  35   Sejal Cofield M.D on 09/25/2018 at 9:19 AM  Between 7am to 7pm - Pager - [phone]  After 7pm go to [email or web address] - password [facility]  [facility] -  Office  [phone]

## 2018-09-25 NOTE — Evaluation (Signed)
Physical Therapy Evaluation Patient Details Name: Kenneth [name] MRN: [id number] DOB: 12-04-1955 Today's Date: 09/25/2018   History of Present Illness  63 yo male admitted from [facility] on 09/23/2018 with sepsis. He was intially admitted on 5/22 after falling multiple times at home. Pt sustained bil sacral ala fractures, S2 compression fx, and L 1st MCP dislocation s/p reduction. Hx of HIV, DVT, chronic fatigue, lupus, multiple fractures from repeated falls.   Clinical Impression  Patient was very weak today. He required significant assist for mobility. He was unable to take steps. He would benefit from further skilled therapy to improve his ability to transfer and walk. He would benefit from a return to SNF once discharged to continue working on mobility.     Follow Up Recommendations SNF    Equipment Recommendations       Recommendations for Other Services       Precautions / Restrictions Precautions Precautions: Fall Restrictions Weight Bearing Restrictions: No      Mobility  Bed Mobility Overal bed mobility: Needs Assistance Bed Mobility: Supine to Sit;Sit to Supine     Supine to sit: Mod assist Sit to supine: Mod assist   General bed mobility comments: mod a to get his legs to edge of the bed and to sit up. Mod a to get back to bed   Transfers Overall transfer level: Needs assistance Equipment used: Rolling walker (2 wheeled) Transfers: Sit to/from Stand Sit to Stand: Max assist         General transfer comment: max a to stand. Patient had difficulty maintaining standing psoition   Ambulation/Gait                Stairs            Wheelchair Mobility    Modified Rankin (Stroke Patients Only)       Balance Overall balance assessment: Needs assistance Sitting-balance support: No upper extremity supported;Feet supported Sitting balance-Leahy Scale: Fair Sitting balance - Comments: gaurding sitting EOB    Standing balance support: Bilateral  upper extremity supported Standing balance-Leahy Scale: Poor Standing balance comment: max a to remain balanced                              Pertinent Vitals/Pain Pain Assessment: No/denies pain    Home Living Family/patient expects to be discharged to:: Private residence Living Arrangements: Non-relatives/Friends Available Help at Discharge: Available 24 hours/day Type of Home: Apartment Home Access: Level entry     Home Layout: One level Home Equipment: Cane - single point;Crutches;Shower seat - built in      Prior Function Level of Independence: Needs assistance   Gait / Transfers Assistance Needed: reports progress at [employer]. Was walking further but over the past few days has become very weak  ADL's / Homemaking Assistance Needed: was at SNF  Comments: p     Hand Dominance        Extremity/Trunk Assessment   Upper Extremity Assessment Upper Extremity Assessment: Overall WFL for tasks assessed    Lower Extremity Assessment Lower Extremity Assessment: Generalized weakness    Cervical / Trunk Assessment Cervical / Trunk Assessment: Normal  Communication   Communication: No difficulties  Cognition Arousal/Alertness: Lethargic Behavior During Therapy: WFL for tasks assessed/performed Overall Cognitive Status: Within Functional Limits for tasks assessed  General Comments      Exercises     Assessment/Plan    PT Assessment Patient needs continued PT services  PT Problem List Decreased strength;Decreased activity tolerance;Decreased balance;Decreased mobility;Decreased knowledge of use of DME       PT Treatment Interventions DME instruction;Gait training;Functional mobility training;Therapeutic activities;Therapeutic exercise;Balance training;Patient/family education    PT Goals (Current goals can be found in the Care Plan section)  Acute Rehab PT Goals Patient Stated Goal: to feel  better PT Goal Formulation: With patient Potential to Achieve Goals: Good    Frequency Min 2X/week   Barriers to discharge        Co-evaluation               AM-PAC PT "6 Clicks" Mobility  Outcome Measure Help needed turning from your back to your side while in a flat bed without using bedrails?: A Lot Help needed moving from lying on your back to sitting on the side of a flat bed without using bedrails?: A Lot Help needed moving to and from a bed to a chair (including a wheelchair)?: A Lot Help needed standing up from a chair using your arms (e.g., wheelchair or bedside chair)?: A Lot Help needed to walk in hospital room?: Total Help needed climbing 3-5 steps with a railing? : Total 6 Click Score: 10    End of Session Equipment Utilized During Treatment: Gait belt Activity Tolerance: Patient tolerated treatment well Patient left: in bed;with call bell/phone within reach Nurse Communication: Mobility status PT Visit Diagnosis: Unsteadiness on feet (R26.81);Other abnormalities of gait and mobility (R26.89);Muscle weakness (generalized) (M62.81)    Time: 0215-0235 PT Time Calculation (min) (ACUTE ONLY): 20 min   Charges:   PT Evaluation $PT Eval Moderate Complexity: 1 Mod           Carney Living PT DPT  09/25/2018, 2:46 PM

## 2018-09-25 NOTE — Plan of Care (Signed)
Spoke with transplant neph at [facility].  Hold MMF for now.  Will reassess restarting in 1-2 days once diarrhea improved.    Harrie Jeans, MD

## 2018-09-25 NOTE — Consult Note (Addendum)
[facility] Renal Consultation Note  Requesting MD: Dhungel Indication for Consultation:  AKI, renal transplant   Chief complaint: diarrhea  HPI:  Kenneth [name] is a 63 y.o. male with a history of ESRD s/p renal transplant at [facility] 04/13/2018 as well as HIV who presented with diarrhea and decreased PO intake.  He was found to have C dif colitis.  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  Note Cr 1.1 on 09/05/18.   He was found to be c dif positive and strep bacteremia.  He has been on PO vanc and IV flagyl and cefepime for the bacteremia.  He has also received normal saline.  Creatinine has improved from 3.51 to 2.99 on consult.  Note SARS coronavirus 2 negative.  He had a recent endoscopy at [facility] which demonstrated ulcerations in the mid and distal esophagus per charting.  No pain over transplant.  Lives at a facility and reports compliance with meds.  Appears a FK506 level drawn earlier today may not be a trough.  He has a foley and this is about to be changed to a condom cath per primary team.  We discussed his renal function.  If his renal function declines to the point that he would need it, he would want hemodialysis.    Abdominal CT with punctate stone in the region of the transplant hilum. Mild fullness of the renal transplant collecting system. No peritransplant fluid collection. The urinary bladder is decompressed around a Foley catheter and contains a small pocket of air.   PMHx:   Past Medical History:  Diagnosis Date  . Achalasia   . Candida infection, esophageal ([place]) 08/2010   a. 2012 - noted on EGD.  Marland Kitchen DVT (deep venous thrombosis) ([place]) ~ 04/2014   ?RLE  . Dysphagia 2008   post heller myotomy/toupee fundoplication.    . ESRD (end stage renal disease) on dialysis [facility])    "MWF: [name]" (01/22/2017)  . Fall 01/22/2017   mechanical fall w/multiple fractures/notes 01/22/2017  . GERD (gastroesophageal  reflux disease)    hx (01/22/2017)  . Hemorrhoids, internal   . History of blood transfusion 03/2014   "low counts"  . History of gout   . History of stomach ulcers   . HIV infection ([place]) dx ~ 2009   a. 02/13/2014 CD4 = 240 - undetectable viral load.  . Hyperlipidemia   . Hypertension    hx (01/22/2017)  . Insomnia   . Lupus nephritis ([place])   . Pancytopenia ([place])   . Premature ventricular contractions   . Renal abscess   . SLE (systemic lupus erythematosus) ([place]) dx'd 2015    Past Surgical History:  Procedure Laterality Date  . BASCILIC VEIN TRANSPOSITION Left 06/29/2014   Procedure: FIRST STAGE  [place];  Surgeon: Rosetta Posner, MD;  Location: [place];  Service: Vascular;  Laterality: Left;  . BASCILIC VEIN TRANSPOSITION Left 09/11/2014   Procedure: SECOND STAGE BASILIC VEIN TRANSPOSITION LEFT UPPER ARM;  Surgeon: Rosetta Posner, MD;  Location: [place];  Service: Vascular;  Laterality: Left;  . COLONOSCOPY  2008   .  tortuous colon, internal hemorrhoids.  no polyps or diverticulosis.   Marland Kitchen ESOPHAGOGASTRODUODENOSCOPY  08/2010   Dr [name].  08/2010 candida esophagitis, no obvious esophageal stricture. 02/2006 and 05/2006 dilated esophagus, narrowed distal esophagus but no stricture, was empirically [place] dilated  . ESOPHAGOGASTRODUODENOSCOPY N/A 06/27/2014   Procedure: ESOPHAGOGASTRODUODENOSCOPY (EGD);  Surgeon: [name], MD;  Location: [facility] ENDOSCOPY;  Service: Endoscopy;  Laterality: N/A;  . ESOPHAGOGASTRODUODENOSCOPY N/A 07/02/2014   Procedure: ESOPHAGOGASTRODUODENOSCOPY (EGD);  Surgeon: [name], MD;  Location: [facility] ENDOSCOPY;  Service: Endoscopy;  Laterality: N/A;  with botox  . [place]   with toupee fundoplication of HH.   . INSERTION OF DIALYSIS CATHETER Right 06/29/2014   Procedure: INSERTION OF DIALYSIS CATHETER RIGHT IJ;  Surgeon: Rosetta Posner, MD;  Location: [place];  Service: Vascular;  Laterality: Right;  . KIDNEY TRANSPLANT     January 2020 @ [facility]   . NEPHRECTOMY Right ~ 2011  . ORIF TIBIA PLATEAU Right 01/23/2017   Procedure: OPEN REDUCTION INTERNAL FIXATION (ORIF) TIBIAL PLATEAU; REPAIR OF LATERAL TIBIAL PLATEAU; ARTHROTOMY WITH MENISCUS REPAIR; ANTERIOR COMPARTMENT FASCIOTOMY;  Surgeon: Altamese McQueeney, MD;  Location: [place];  Service: Orthopedics;  Laterality: Right;    Family Hx:  Family History  Problem Relation Age of Onset  . Colon cancer Father        deceased  . Other Mother        s/p pacemaker - alive and well.  . Hypertension Mother   . Other Other        5 brothers, 3 sisters - alive and well.    Social History:  reports that he has never smoked. He has never used smokeless tobacco. He reports that he does not drink alcohol or use drugs.  Allergies: No Known Allergies  Medications: Prior to Admission medications   Medication Sig Start Date End Date Taking? Authorizing Provider  abacavir-dolutegravir-lamiVUDine (TRIUMEQ) 600-50-300 MG tablet Take 1 tablet by mouth daily. 03/21/18  Yes [place] Callas, NP  amitriptyline (ELAVIL) 10 MG tablet Take 1 tablet (10 mg total) by mouth at bedtime. 02/02/17  Yes Angiulli, Lavon Paganini, PA-C  aspirin 81 MG chewable tablet Chew 81 mg by mouth daily.   Yes [provider]  calcitRIOL (ROCALTROL) 0.25 MCG capsule Take 0.25 mcg by mouth every Monday, Wednesday, and Friday. FOR ESRD WITH TRANSPLANT (DO NOT CRUSH)   Yes [provider]  Cholecalciferol 50 MCG (2000 UT) TABS Take 2,000 Units by mouth daily.    Yes [provider]  HYDROcodone-acetaminophen (NORCO) 7.5-325 MG tablet Take 1 tablet by mouth every 6 (six) hours as needed for moderate pain. 08/28/18  Yes Hendricks Limes, MD  hydroxychloroquine (PLAQUENIL) 200 MG tablet Take 1 tablet (200 mg total) by mouth 2 (two) times daily. 02/02/17  Yes Angiulli, Lavon Paganini, PA-C  metoprolol tartrate (LOPRESSOR) 25 MG tablet Take 12.5 mg by mouth 2 (two) times daily. Hold for SBP <105   Yes [provider]  mycophenolate (CELLCEPT) 250 MG capsule Take 250 mg by mouth every 12 (twelve) hours.  04/18/18  Yes [provider]  predniSONE (DELTASONE) 10 MG tablet Take 1 tablet (10 mg total) by mouth daily. 02/02/17  Yes Angiulli, Lavon Paganini, PA-C  saccharomyces boulardii (FLORASTOR) 250 MG capsule Take 250 mg by mouth daily.  09/16/18 09/30/18 Yes [provider]  sucralfate (CARAFATE) 1 GM/10ML suspension Take 10 mLs by mouth 4 (four) times daily -  before meals and at bedtime. For 14 days 09/19/18 10/03/18 Yes [provider]  tacrolimus (PROGRAF) 0.5 MG capsule Take 1.5 mg by mouth 2 (two) times daily.  08/06/18 08/01/19 Yes [provider]  tamsulosin (FLOMAX) 0.4 MG CAPS capsule Take 0.4 mg by mouth daily.  07/17/18  Yes [provider]  sulfamethoxazole-trimethoprim (BACTRIM DS)  800-160 MG tablet Take 1 tablet by mouth every Monday, Wednesday, and Friday.    [provider]    I have reviewed the patient's current medications.  Labs:  BMP Latest Ref Rng & Units 09/25/2018 09/24/2018 09/23/2018  Glucose 70 - 99 mg/dL 109(H) 104(H) 141(H)  BUN 8 - 23 mg/dL 91(H) 77(H) 74(H)  Creatinine 0.61 - 1.24 mg/dL 2.99(H) 3.51(H) 3.68(H)  BUN/Creat Ratio 6 - 22 (calc) - - -  Sodium 135 - 145 mmol/L 133(L) 131(L) 133(L)  Potassium 3.5 - 5.1 mmol/L 4.3 4.6 4.5  Chloride 98 - 111 mmol/L 109 103 101  CO2 22 - 32 mmol/L 13(L) 14(L) 16(L)  Calcium 8.9 - 10.3 mg/dL 6.7(L) 7.6(L) 8.4(L)    Urinalysis    Component Value Date/Time   COLORURINE AMBER (A) 09/23/2018 2030   APPEARANCEUR CLOUDY (A) 09/23/2018 2030   LABSPEC 1.024 09/23/2018 2030   PHURINE 5.0 09/23/2018 2030   GLUCOSEU NEGATIVE 09/23/2018 2030   GLUCOSEU NEG mg/dL 08/16/2006 2308   HGBUR NEGATIVE 09/23/2018 2030   BILIRUBINUR SMALL (A) 09/23/2018 2030   BILIRUBINUR neg 03/05/2014 1214   [place] NEGATIVE 09/23/2018 2030   PROTEINUR 30 (A) 09/23/2018 2030   UROBILINOGEN 0.2 07/04/2014 0022    NITRITE NEGATIVE 09/23/2018 2030   LEUKOCYTESUR NEGATIVE 09/23/2018 2030     ROS:  Pertinent items noted in HPI and remainder of comprehensive ROS otherwise negative.  Physical Exam: Vitals:   09/25/18 0135 09/25/18 0605  BP: 126/66 126/72  Pulse: (!) 101 (!) 115  Resp: 20 20  Temp: 97.9 F (36.6 C) 98.1 F (36.7 C)  SpO2: 100% 100%     General: adult male NAD  Eyes: EOMI; sclera anicteric  Head [place]/AT Neck: no JVD Heart: tachycardia; no rub  Lungs: CTAB and unlabored  Abdomen: soft/NT/ND.  RLQ transplant -nontender and no bruit  Extremities: no edema; no cyanosis or clubbing  Skin: no rash on extremities exposed  Neuro: alert and oriented x 4; conversant    Assessment/Plan:  # AKI  - Pre-renal insults in the setting of cdif colitis and sepsis  - Improving with supportive care but not persistent BUN elevation  - Continue hydration - would transition to IV bicarbonate from normal saline  - Would resume flomax  - Repeat UA  - Monitor for retention - Strict ins/outs  - Transplant ultrasound.  Punctate stone at transplant hilum with mild fullness of collecting system  # Metabolic acidosis - GI losses - Transition to IV bicarbonate infusion   # ESRD s/p renal transplant  - Immunosuppression as below.  Cr 1.1 on 09/05/18  # Severe c difficile colitis  - on PO vanc as well as IV flagyl  # Streptococcus bacteremia - On abx per primary team   # Immunosuppression  - On cellcept 250 mg BID and prograf 1.5 mg BID and prednisone 10 mg daily  - I have contacted transplant team at [facility].  Spoke with ID (transplant ID) and I will discuss with transplant neph.  ID favors lowering cellcept but await call from transplant neph. Renal will dose.  - Continue current regimen for now - Check prograf level this evening - retimed dosing for 8 am and 8 pm  # Hematuria  - Repeat UA   # SLE  - on hydroxychloroquine as well as cellcept   # HIV  - Per primary team.  Per transplant  ID  Claudia Desanctis 09/25/2018, 12:50 PM

## 2018-09-25 NOTE — TOC Initial Note (Signed)
Transition of Care [facility]) - Initial/Assessment Note    Patient Details  Name: Kenneth [name] MRN: [id number] Date of Birth: 1955/09/05  Transition of [facility]) CM/SW Contact:    Zenon Mayo, RN Phone Number: 09/25/2018, 9:30 AM  Clinical Narrative:                 From home alone, he was at [facility] previously, he seems very frail and weak,  He is on bedrest, he states if he has to go back to a SNF he would like to go to [place].  He has a walker, wheelchair and crutches at home.  He states his niece lives an hour away .   Expected Discharge Plan: Skilled Nursing Facility Barriers to Discharge: No Barriers Identified   Patient Goals and CMS Choice Patient states their goals for this hospitalization and ongoing recovery are:: to be able to get his appetite back   Choice offered to / list presented to : NA  Expected Discharge Plan and Services Expected Discharge Plan: [place] In-house Referral: Clinical Social Work Discharge Planning Services: CM Consult Post Acute Care Choice: NA Living arrangements for the past 2 months: Single Family Home Expected Discharge Date: 09/26/18               DME Arranged: (NA)         HH Arranged: NA          Prior Living Arrangements/Services Living arrangements for the past 2 months: Single Family Home Lives with:: Self Patient language and need for interpreter reviewed:: Yes Do you feel safe going back to the place where you live?: Yes      Need for Family Participation in Patient Care: Yes (Comment) Care giver support system in place?: No (comment)   Criminal Activity/Legal Involvement Pertinent to Current Situation/Hospitalization: No - Comment as needed  Activities of Daily Living Home Assistive Devices/Equipment: Walker (specify type) ADL Screening (condition at time of admission) Patient's cognitive ability adequate to safely complete daily activities?: Yes Is the patient deaf or have  difficulty hearing?: No Does the patient have difficulty seeing, even when wearing glasses/contacts?: Yes(glasses) Does the patient have difficulty concentrating, remembering, or making decisions?: No Patient able to express need for assistance with ADLs?: Yes Does the patient have difficulty dressing or bathing?: Yes Independently performs ADLs?: No Communication: Independent Dressing (OT): Needs assistance Is this a change from baseline?: Pre-admission baseline Grooming: Needs assistance Is this a change from baseline?: Pre-admission baseline Feeding: Independent Bathing: Needs assistance Is this a change from baseline?: Pre-admission baseline Toileting: Needs assistance Is this a change from baseline?: Pre-admission baseline In/Out Bed: Needs assistance Is this a change from baseline?: Pre-admission baseline Walks in Home: Needs assistance Is this a change from baseline?: Pre-admission baseline Does the patient have difficulty walking or climbing stairs?: Yes Weakness of Legs: Both Weakness of Arms/Hands: Both  Permission Sought/Granted Permission sought to share information with : Facility Sport and exercise psychologist, Case Optician, dispensing granted to share information with : Yes, Verbal Permission Granted              Emotional Assessment Appearance:: Appears stated age Attitude/Demeanor/Rapport: Engaged Affect (typically observed): Appropriate Orientation: : Oriented to Self, Oriented to Place, Oriented to  Time, Oriented to Situation Alcohol / Substance Use: Not Applicable Psych Involvement: No (comment)  Admission diagnosis:  AKI (acute kidney injury) ([place]) [N17.9] Altered mental status, unspecified altered mental status type [R41.82] Sepsis, due to unspecified organism, unspecified whether acute organ  dysfunction present ([place]) [A41.9] Severe sepsis ([place]) [A41.9, R65.20] Patient Active Problem List   Diagnosis Date Noted  . Current chronic use of systemic steroids  09/24/2018  . C. difficile diarrhea 09/24/2018  . Esophageal ulcer 09/21/2018  . History of renal transplant 09/19/2018  . Pelvic fracture ([place]) 08/25/2018  . Multiple falls 08/25/2018  . Thumb fracture 08/25/2018  . Closed nondisplaced zone I fracture of sacrum ([place]) 08/22/2018  . Tachycardia 08/22/2018  . Suprapubic pain 04/11/2018  . Osteoporosis 03/15/2017  . Health care maintenance 06/30/2016  . Chronic fatigue 03/11/2015  . SLE (systemic lupus erythematosus) ([place]) 02/24/2015  . Severe sepsis ([place]) 02/23/2015  . AKI (acute kidney injury) ([place])   . Dysphagia   . ESRD (end stage renal disease) ([place])   . DVT (deep venous thrombosis) ([place]) 06/22/2014  . PVC's (premature ventricular contractions) 03/17/2014  . Abdominal pain   . Protein-calorie malnutrition, severe ([place]) 02/11/2014  . Lupus nephritis ([place])   . Fever   . Arthralgia of multiple sites, bilateral 02/02/2014  . Bilateral low back pain without sciatica 02/01/2014  . EXTERNAL HEMORRHOIDS WITHOUT MENTION COMP 01/20/2009  . HIV disease ([place]) 07/24/2006  . Hyperlipidemia 05/31/2006  . ERECTILE DYSFUNCTION 05/31/2006  . INSOMNIA NOS 05/31/2006   PCP:  [name], MD Pharmacy:   [facility], [place] [address] Phone: [phone] Fax: [phone]  [id number] [place], [place] [address] [place] [address] Phone: [phone] Fax: [phone]  [place], [place] [address] [place] [address] Phone: [phone] Fax: [phone]     Social Determinants of Health (SDOH) Interventions    Readmission Risk Interventions Readmission Risk Prevention Plan 09/25/2018  Transportation Screening Complete  HRI or [place] Complete  Social Work Consult  for [place] Planning/Counseling Complete  Palliative Care Screening Complete  Medication Review Press photographer) Complete  Some recent data might be hidden

## 2018-09-25 NOTE — Plan of Care (Signed)
Patient very weak, needs assistance with tray sit up.  Painful blisters in mouth, MD aware.  Patient stated that family can be updated, spoke to patients brother on the phone with updates.

## 2018-09-25 NOTE — Progress Notes (Signed)
Pt having another episode of chest pain. Vitals taken, EKG done, MD notified. Will continue to monitor pt.

## 2018-09-25 NOTE — Plan of Care (Signed)
  Problem: Education: Goal: Knowledge of General Education information will improve Description: Including pain rating scale, medication(s)/side effects and non-pharmacologic comfort measures Outcome: Progressing   Problem: Health Behavior/Discharge Planning: Goal: Ability to manage health-related needs will improve Outcome: Progressing   Problem: Clinical Measurements: Goal: Ability to maintain clinical measurements within normal limits will improve Outcome: Progressing Goal: Will remain free from infection Outcome: Progressing Goal: Diagnostic test results will improve Outcome: Progressing Goal: Respiratory complications will improve Outcome: Progressing Goal: Cardiovascular complication will be avoided Outcome: Not Met (add Reason)   Problem: Activity: Goal: Risk for activity intolerance will decrease Outcome: Progressing   Problem: Nutrition: Goal: Adequate nutrition will be maintained Outcome: Not Met (add Reason)   Problem: Coping: Goal: Level of anxiety will decrease Outcome: Progressing   Problem: Elimination: Goal: Will not experience complications related to bowel motility Outcome: Progressing Goal: Will not experience complications related to urinary retention Outcome: Progressing   Problem: Pain Managment: Goal: General experience of comfort will improve Outcome: Progressing   Problem: Safety: Goal: Ability to remain free from injury will improve Outcome: Progressing   Problem: Skin Integrity: Goal: Risk for impaired skin integrity will decrease Outcome: Progressing

## 2018-09-25 NOTE — Progress Notes (Signed)
Pharmacy Antibiotic Note  Kenneth [name] is a 63 y.o. male concern of strep bacteremia. Pharmacy consulted to dose cefepime. He is also noted with oral ulcers and pharmacy to dose acyclovir.  He is currently on po vancomycin and flagyl for cdiff.  -WBC= 2.9, afebrile -SCr= 2.99, CrCl ~ 20   Plan: -Cefepime 2gm IV q24hr -Acyclovir 200mg  po q8h -Will follow renal function, cultures and clinical progress   Height: 5\' 11"  (180.3 cm) Weight: 115 lb 11.9 oz (52.5 kg) IBW/kg (Calculated) : 75.3  Temp (24hrs), Avg:98 F (36.7 C), Min:97.5 F (36.4 C), Max:98.2 F (36.8 C)  Recent Labs  Lab 09/23/18 1854 09/24/18 0408 09/25/18 0726  WBC 2.6* 2.5* 2.9*  CREATININE 3.68* 3.51* 2.99*  LATICACIDVEN 2.7* 1.6  --     Estimated Creatinine Clearance: 19.3 mL/min (A) (by C-G formula based on SCr of 2.99 mg/dL (H)).    No Known Allergies  Antimicrobials this admission: Vancomycin 6/22 >>6/22 Cefepime 6/22 >>6/22  Po vancomycin 6/22> 7/7 Metronidazole 6/22> 7/7 Acyclovir 6/24>>  Dose adjustments this admission:   Microbiology results: 6/22 csf- ngtd 6/22 urine- neg 6/22 blood x2: 1/2 growing GPC/pairs and chains (BCID w/ strep species)   Thank you for allowing pharmacy to be a part of this patient's care.  Hildred Laser, PharmD Clinical Pharmacist **Pharmacist phone directory can now be found on [email or web address] ([email or web address]).  Listed under [place].

## 2018-09-25 NOTE — Progress Notes (Signed)
Ortho Trauma Note  Aware of patient. Reviewed CT scan from yesterday. Still plan for nonoperative treatment. Patient can be WBAT. Will see patient tomorrow AM.  Shona Needles, MD [facility] [phone] (phone) [phone] (office) [email or web address]

## 2018-09-25 NOTE — Progress Notes (Signed)
Initial Nutrition Assessment  DOCUMENTATION CODES:   Underweight  INTERVENTION:   -Ensure Enlive po TID, each supplement provides 350 kcal and 20 grams of protein -MVI with minerals daily -Downgrade diet to dysphagia 2 (mechanical soft) for ease of intake -Feeding assistance with meals  NUTRITION DIAGNOSIS:   Inadequate oral intake related to mouth pain, decreased appetite as evidenced by meal completion < 25%.  GOAL:   Patient will meet greater than or equal to 90% of their needs  MONITOR:   PO intake, Supplement acceptance, Labs, Weight trends, Skin, I & O's  REASON FOR ASSESSMENT:   Consult Assessment of nutrition requirement/status  ASSESSMENT:   Kenneth [name] is a 63 y.o. 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  Pt admitted with severe sepsis and acute metabolic encephalopathy.   Reviewed I/O's: +820 ml x 24 hours and +6.6 L since admission  Per orthopedics notes, pt with rt transverse process L5 fracture and sacral fractures. Continued plan for non-operative management.   Reviewed chart from SNF; PTA medication include probiotic, calcium, and medpass 120 ml BID.   Attempted to visit pt in room, however, RD did not enter room in effort to preserve PPE (pt on contact precautions). Pt was sleeping soundly at time of visit. Called to pt room at 1500, however, no answer.   Pt with poor oral intake; noted meal completion 0%. Per MD notes, pt with oral ulcers/ thrush, which is likely contributing to PO intake.   Per wt records, pt has xxperienced a 12.4% wt loss over the past 6 months, which is significant for time frame. Pt appeared frail when RD was observing the pt, however, unable to identify malnutrition without further history and completion of nutrition-focused physical exam.  Labs reviewed: Na: 131.   Diet Order:   Diet Order            Diet Heart Room service  appropriate? Yes; Fluid consistency: Thin  Diet effective now              EDUCATION NEEDS:   No education needs have been identified at this time  Skin:  Skin Assessment: Reviewed RN Assessment  Last BM:  09/24/18  Height:   Ht Readings from Last 1 Encounters:  09/24/18 5\' 11"  (1.803 m)    Weight:   Wt Readings from Last 1 Encounters:  09/25/18 52.5 kg    Ideal Body Weight:  78.2 kg  BMI:  Body mass index is 16.14 kg/m.  Estimated Nutritional Needs:   Kcal:  1900-2100  Protein:  90-105 grams  Fluid:  > 1.9 L    Delorse Shane A. Jimmye Norman, RD, LDN, [place] Registered Dietitian II Certified Diabetes Care and Education Specialist Pager: [phone] After hours Pager: [phone]

## 2018-09-26 ENCOUNTER — Inpatient Hospital Stay (HOSPITAL_COMMUNITY): Payer: Medicare Other

## 2018-09-26 DIAGNOSIS — S3210XA Unspecified fracture of sacrum, initial encounter for closed fracture: Secondary | ICD-10-CM

## 2018-09-26 DIAGNOSIS — N179 Acute kidney failure, unspecified: Secondary | ICD-10-CM

## 2018-09-26 DIAGNOSIS — Z86718 Personal history of other venous thrombosis and embolism: Secondary | ICD-10-CM

## 2018-09-26 DIAGNOSIS — Z21 Asymptomatic human immunodeficiency virus [HIV] infection status: Secondary | ICD-10-CM

## 2018-09-26 DIAGNOSIS — I959 Hypotension, unspecified: Secondary | ICD-10-CM

## 2018-09-26 DIAGNOSIS — S72111A Displaced fracture of greater trochanter of right femur, initial encounter for closed fracture: Secondary | ICD-10-CM

## 2018-09-26 DIAGNOSIS — Z79899 Other long term (current) drug therapy: Secondary | ICD-10-CM

## 2018-09-26 DIAGNOSIS — R6510 Systemic inflammatory response syndrome (SIRS) of non-infectious origin without acute organ dysfunction: Secondary | ICD-10-CM

## 2018-09-26 DIAGNOSIS — B9689 Other specified bacterial agents as the cause of diseases classified elsewhere: Secondary | ICD-10-CM

## 2018-09-26 DIAGNOSIS — M3214 Glomerular disease in systemic lupus erythematosus: Secondary | ICD-10-CM

## 2018-09-26 DIAGNOSIS — M329 Systemic lupus erythematosus, unspecified: Secondary | ICD-10-CM

## 2018-09-26 DIAGNOSIS — R7881 Bacteremia: Secondary | ICD-10-CM

## 2018-09-26 DIAGNOSIS — Z940 Kidney transplant status: Secondary | ICD-10-CM

## 2018-09-26 DIAGNOSIS — D703 Neutropenia due to infection: Secondary | ICD-10-CM

## 2018-09-26 DIAGNOSIS — X58XXXA Exposure to other specified factors, initial encounter: Secondary | ICD-10-CM

## 2018-09-26 DIAGNOSIS — S32059A Unspecified fracture of fifth lumbar vertebra, initial encounter for closed fracture: Secondary | ICD-10-CM

## 2018-09-26 DIAGNOSIS — K121 Other forms of stomatitis: Secondary | ICD-10-CM

## 2018-09-26 DIAGNOSIS — B954 Other streptococcus as the cause of diseases classified elsewhere: Secondary | ICD-10-CM

## 2018-09-26 LAB — BASIC METABOLIC PANEL
Anion gap: 10 (ref 5–15)
BUN: 94 mg/dL — ABNORMAL HIGH (ref 8–23)
CO2: 17 mmol/L — ABNORMAL LOW (ref 22–32)
Calcium: 6.5 mg/dL — ABNORMAL LOW (ref 8.9–10.3)
Chloride: 106 mmol/L (ref 98–111)
Creatinine, Ser: 3.11 mg/dL — ABNORMAL HIGH (ref 0.61–1.24)
GFR calc Af Amer: 24 mL/min — ABNORMAL LOW (ref >60)
GFR calc non Af Amer: 21 mL/min — ABNORMAL LOW (ref >60)
Glucose, Bld: 213 mg/dL — ABNORMAL HIGH (ref 70–99)
Potassium: 3.7 mmol/L (ref 3.5–5.1)
Sodium: 133 mmol/L — ABNORMAL LOW (ref 135–145)

## 2018-09-26 LAB — URINALYSIS, ROUTINE W REFLEX MICROSCOPIC
Bilirubin Urine: NEGATIVE
Glucose, UA: NEGATIVE mg/dL
Hgb urine dipstick: NEGATIVE
Ketones, ur: NEGATIVE mg/dL
Nitrite: NEGATIVE
Protein, ur: 30 mg/dL — AB
Specific Gravity, Urine: 1.023 (ref 1.005–1.030)
pH: 5 (ref 5.0–8.0)

## 2018-09-26 LAB — CBC
HCT: 32.5 % — ABNORMAL LOW (ref 39.0–52.0)
Hemoglobin: 11.6 g/dL — ABNORMAL LOW (ref 13.0–17.0)
MCH: 30.9 pg (ref 26.0–34.0)
MCHC: 35.7 g/dL (ref 30.0–36.0)
MCV: 86.4 fL (ref 80.0–100.0)
Platelets: 175 10*3/uL (ref 150–400)
RBC: 3.76 MIL/uL — ABNORMAL LOW (ref 4.22–5.81)
RDW: 13.6 % (ref 11.5–15.5)
WBC: 1.6 10*3/uL — ABNORMAL LOW (ref 4.0–10.5)
nRBC: 0 % (ref 0.0–0.2)

## 2018-09-26 LAB — CULTURE, BLOOD (ROUTINE X 2): Special Requests: ADEQUATE

## 2018-09-26 LAB — TROPONIN I (HIGH SENSITIVITY)
Troponin I (High Sensitivity): 22 ng/L — ABNORMAL HIGH (ref <18)
Troponin I (High Sensitivity): 23 ng/L — ABNORMAL HIGH (ref <18)

## 2018-09-26 MED ORDER — PENICILLIN G POTASSIUM 20000000 UNITS IJ SOLR
4.0000 10*6.[IU] | Freq: Three times a day (TID) | INTRAVENOUS | Status: DC
  Administered 2018-09-26 – 2018-09-29 (×8): 4 10*6.[IU] via INTRAVENOUS
  Filled 2018-09-26 (×10): qty 4

## 2018-09-26 MED ORDER — SODIUM CHLORIDE 0.9 % IV BOLUS
500.0000 mL | Freq: Once | INTRAVENOUS | Status: AC
  Administered 2018-09-26: 500 mL via INTRAVENOUS

## 2018-09-26 MED ORDER — CALCIUM GLUCONATE-NACL 1-0.675 GM/50ML-% IV SOLN
1.0000 g | Freq: Once | INTRAVENOUS | Status: AC
  Administered 2018-09-26: 1000 mg via INTRAVENOUS
  Filled 2018-09-26: qty 50

## 2018-09-26 MED ORDER — SODIUM CHLORIDE 0.9 % IV SOLN
2.0000 g | INTRAVENOUS | Status: DC
  Filled 2018-09-26: qty 20

## 2018-09-26 MED ORDER — HYDROMORPHONE HCL 1 MG/ML IJ SOLN
INTRAMUSCULAR | Status: AC
  Administered 2018-09-26: 0.5 mg
  Filled 2018-09-26: qty 0.5

## 2018-09-26 MED ORDER — HYDROMORPHONE HCL 1 MG/ML IJ SOLN: 0.5000 mg | Freq: Once | INTRAMUSCULAR | Status: AC

## 2018-09-26 MED ORDER — SULFAMETHOXAZOLE-TRIMETHOPRIM 800-160 MG PO TABS
1.0000 | ORAL_TABLET | ORAL | Status: DC
  Administered 2018-09-27: 1 via ORAL
  Filled 2018-09-26: qty 1

## 2018-09-26 MED ORDER — SODIUM CHLORIDE 0.9 % IV SOLN
INTRAVENOUS | Status: DC | PRN
  Administered 2018-09-26: 1000 mL via INTRAVENOUS

## 2018-09-26 MED ORDER — ALUM & MAG HYDROXIDE-SIMETH 200-200-20 MG/5ML PO SUSP
30.0000 mL | Freq: Two times a day (BID) | ORAL | Status: DC | PRN
  Administered 2018-09-26: 30 mL via ORAL
  Filled 2018-09-26: qty 30

## 2018-09-26 NOTE — Clinical Social Work Note (Signed)
Pt will transfer to [facility]. No need for SNF workup at this time.  [place], [place]

## 2018-09-26 NOTE — Progress Notes (Signed)
Pt high sensitivity troponin is 23. MD notified. Will continue to monitor.

## 2018-09-26 NOTE — Progress Notes (Addendum)
Pt bladder is distended and firm. Pt had foley in yesterday and was able to make urine after foley was out. Bladder scanned preformed. MD notified. Will continue to monitor pt.

## 2018-09-26 NOTE — Consult Note (Signed)
[facility]opaedic [facility]uma [facility] (OTS) Consult   Patient ID: Kenneth [name] MRN: [id number] DOB/AGE: 09-May-1955 63 y.o.  Reason for Consult: Right greater trochanter fracture Referring Physician: Darliss Cheney, MD ([facility])  HPI: Kenneth [name] is an 63 y.o. male being seen in consultation at request of Dr. Doristine Bosworth for right greater trochanter fracture. Patient previously admitted to [facility] on 08/25/18 after a fall which resulted in U-type sacral insufficiency fracture. Patient evaluated at that time and no surgical intervention was warranted. Patient encouraged to mobilize with physical therapy.  Patient discharged to [facility] on 08/28/18 for continued therapies. Patient admitted to [facility] on 09/22/28 with hypotension, diarrhea, and sepsis. Nondisplaced right greater trochanter fracture incidentally found on CT abdomen/ pelvis performed on admission. Orthopaedics was consulted.  Patient evaluated on 3E this AM.  He is not currently complaining of any pain in his extremities. He does note he had a fall at [facility] but he does not remember when. He denies any numbness or tingling in either of his legs. Patient evaluated by PT yesterday but has not been able to ambulate much since hospital admission due to weakness related to several other medical problems.   Past Medical History:  Diagnosis Date  . Achalasia   . Candida infection, esophageal ([place]) 08/2010   a. 2012 - noted on EGD.  Marland Kitchen DVT (deep venous thrombosis) ([place]) ~ 04/2014   ?RLE  . Dysphagia 2008   post heller myotomy/toupee fundoplication.    . ESRD (end stage renal disease) on dialysis [facility])    "MWF: [name]" (01/22/2017)  . Fall 01/22/2017   mechanical fall w/multiple fractures/notes 01/22/2017  . GERD (gastroesophageal reflux disease)    hx (01/22/2017)  . Hemorrhoids, internal   . History of blood transfusion 03/2014   "low counts"  . History of gout   .  History of stomach ulcers   . HIV infection ([place]) dx ~ 2009   a. 02/13/2014 CD4 = 240 - undetectable viral load.  . Hyperlipidemia   . Hypertension    hx (01/22/2017)  . Insomnia   . Lupus nephritis ([place])   . Pancytopenia ([place])   . Premature ventricular contractions   . Renal abscess   . SLE (systemic lupus erythematosus) ([place]) dx'd 2015    Past Surgical History:  Procedure Laterality Date  . BASCILIC VEIN TRANSPOSITION Left 06/29/2014   Procedure: FIRST STAGE  [place];  Surgeon: Rosetta Posner, MD;  Location: [place];  Service: Vascular;  Laterality: Left;  . BASCILIC VEIN TRANSPOSITION Left 09/11/2014   Procedure: SECOND STAGE BASILIC VEIN TRANSPOSITION LEFT UPPER ARM;  Surgeon: Rosetta Posner, MD;  Location: [facility];  Service: Vascular;  Laterality: Left;  . COLONOSCOPY  2008   .  tortuous colon, internal hemorrhoids.  no polyps or diverticulosis.   Marland Kitchen ESOPHAGOGASTRODUODENOSCOPY  08/2010   Dr [name].  08/2010 candida esophagitis, no obvious esophageal stricture. 02/2006 and 05/2006 dilated esophagus, narrowed distal esophagus but no stricture, was empirically Savary dilated  . ESOPHAGOGASTRODUODENOSCOPY N/A 06/27/2014   Procedure: ESOPHAGOGASTRODUODENOSCOPY (EGD);  Surgeon: [name], MD;  Location: [facility] ENDOSCOPY;  Service: Endoscopy;  Laterality: N/A;  . ESOPHAGOGASTRODUODENOSCOPY N/A 07/02/2014   Procedure: ESOPHAGOGASTRODUODENOSCOPY (EGD);  Surgeon: [name], MD;  Location: [facility] ENDOSCOPY;  Service: Endoscopy;  Laterality: N/A;  with botox  . [place]   with toupee fundoplication of HH.   . INSERTION OF DIALYSIS CATHETER Right 06/29/2014   Procedure: INSERTION  OF DIALYSIS CATHETER RIGHT IJ;  Surgeon: Rosetta Posner, MD;  Location: [place];  Service: Vascular;  Laterality: Right;  . KIDNEY TRANSPLANT     January 2020 @ [facility]  . NEPHRECTOMY Right ~ 2011  . ORIF TIBIA PLATEAU Right 01/23/2017   Procedure: OPEN REDUCTION INTERNAL FIXATION (ORIF) TIBIAL PLATEAU; REPAIR  OF LATERAL TIBIAL PLATEAU; ARTHROTOMY WITH MENISCUS REPAIR; ANTERIOR COMPARTMENT FASCIOTOMY;  Surgeon: Altamese Wasco, MD;  Location: [place];  Service: Orthopedics;  Laterality: Right;    Family History  Problem Relation Age of Onset  . Colon cancer Father        deceased  . Other Mother        s/p pacemaker - alive and well.  . Hypertension Mother   . Other Other        5 brothers, 3 sisters - alive and well.    Social History:  reports that he has never smoked. He has never used smokeless tobacco. He reports that he does not drink alcohol or use drugs.  Allergies: No Known Allergies  Medications: I have reviewed the patient's current medications.  ROS: Constitutional: No fever or chills Vision: No changes in vision ENT: +difficulty swallowing. Known ulcers in mouth/throat CV: No chest pain Pulm: No SOB or wheezing GI: No nausea or vomiting GU: No urgency or inability to hold urine Skin: No poor wound healing Neurologic: No numbness or tingling Psychiatric: No depression or anxiety Heme: No bruising Allergic: No reaction to medications or food   Exam: Blood pressure 126/76, pulse 100, temperature 97.9 F (36.6 C), temperature source Oral, resp. rate 18, height 5\' 11"  (1.803 m), weight 55.6 kg, SpO2 100 %. General: Laying in bed, chronically ill appearing, NAD Orientation: Alert and oriented. Mood and Affect: Mood and affect appropriate Gait: Not assessed during exam Coordination and balance: within normal limits   Right Lower Extremity: Skin without lesions. No tenderness to palpation of posterior/anterior hip, thigh, knee, or lower leg. No pain with palpation of SI joint or low back. No pain with log roll of hip or compression of pelvis. Full knee ROM without discomfort. Dorsiflexion/plantarflexion intact. Sensation intact to light touch of extremity. Motor function intact. 2+ DP pulse  Left Lower extremity: Skin without lesions. No tenderness to palpation of  posterior/anterior hip, thigh, knee, or lower leg. No pain with palpation of SI joint or low back. No pain with log roll of hip or compression of pelvis. Full knee ROM without discomfort. Dorsiflexion/plantarflexion intact. Sensation intact to light touch of extremity. Motor function intact. 2+ DP pulse   Medical Decision Making: Imaging: CT abdomen/pelvis shows fractures of the greater trochanter of the right femur, right transverse process of L5, and left pubic bone. Subacute bilateral sacral alar fractures, stable from prior CT 08/25/18 also noted.  Labs:  Results for orders placed or performed during the hospital encounter of 09/23/18 (from the past 24 hour(s))  CBC     Status: Abnormal   Collection Time: 09/25/18  7:26 AM  Result Value Ref Range   WBC 2.9 (L) 4.0 - 10.5 K/uL   RBC 4.15 (L) 4.22 - 5.81 MIL/uL   Hemoglobin 12.9 (L) 13.0 - 17.0 g/dL   HCT 36.8 (L) 39.0 - 52.0 %   MCV 88.7 80.0 - 100.0 fL   MCH 31.1 26.0 - 34.0 pg   MCHC 35.1 30.0 - 36.0 g/dL   RDW 13.7 11.5 - 15.5 %   Platelets 199 150 - 400 K/uL   nRBC 0.0  0.0 - 0.2 %  Basic metabolic panel     Status: Abnormal   Collection Time: 09/25/18  7:26 AM  Result Value Ref Range   Sodium 133 (L) 135 - 145 mmol/L   Potassium 4.3 3.5 - 5.1 mmol/L   Chloride 109 98 - 111 mmol/L   CO2 13 (L) 22 - 32 mmol/L   Glucose, Bld 109 (H) 70 - 99 mg/dL   BUN 91 (H) 8 - 23 mg/dL   Creatinine, Ser 2.99 (H) 0.61 - 1.24 mg/dL   Calcium 6.7 (L) 8.9 - 10.3 mg/dL   GFR calc non Af Amer 22 (L) >60 mL/min   GFR calc Af Amer 25 (L) >60 mL/min   Anion gap 11 5 - 15  Troponin I (High Sensitivity)     Status: Abnormal   Collection Time: 09/25/18 11:33 PM  Result Value Ref Range   Troponin I (High Sensitivity) 23 (H) <18 ng/L  CBC     Status: Abnormal   Collection Time: 09/26/18  2:29 AM  Result Value Ref Range   WBC 1.6 (L) 4.0 - 10.5 K/uL   RBC 3.76 (L) 4.22 - 5.81 MIL/uL   Hemoglobin 11.6 (L) 13.0 - 17.0 g/dL   HCT 32.5 (L) 39.0 - 52.0  %   MCV 86.4 80.0 - 100.0 fL   MCH 30.9 26.0 - 34.0 pg   MCHC 35.7 30.0 - 36.0 g/dL   RDW 13.6 11.5 - 15.5 %   Platelets 175 150 - 400 K/uL   nRBC 0.0 0.0 - 0.2 %  Basic metabolic panel     Status: Abnormal   Collection Time: 09/26/18  2:29 AM  Result Value Ref Range   Sodium 133 (L) 135 - 145 mmol/L   Potassium 3.7 3.5 - 5.1 mmol/L   Chloride 106 98 - 111 mmol/L   CO2 17 (L) 22 - 32 mmol/L   Glucose, Bld 213 (H) 70 - 99 mg/dL   BUN 94 (H) 8 - 23 mg/dL   Creatinine, Ser 3.11 (H) 0.61 - 1.24 mg/dL   Calcium 6.5 (L) 8.9 - 10.3 mg/dL   GFR calc non Af Amer 21 (L) >60 mL/min   GFR calc Af Amer 24 (L) >60 mL/min   Anion gap 10 5 - 15  Troponin I (High Sensitivity)     Status: Abnormal   Collection Time: 09/26/18  2:29 AM  Result Value Ref Range   Troponin I (High Sensitivity) 22 (H) <18 ng/L     Medical history and chart was reviewed  Assessment/Plan: 63 year old male with greater trochanter fracture of right femur and left pubic bone fracture. Patient with previous bilateral sacral ala fractures from falls in May 2020, stable on CT scan.   Patient currently without pain in either lower extremity and no pain re-produced on exam. There is currently no role for surgical intervention of the greater trochanter fracture. Will continue to treat sacral fractures non-operatively as well. Patient can be weightbearing as tolerated on bilateral lower extremities as pain allows. Will obtain vitamin D level on patient and likely start Vitamin D supplement while in the hospital which should be continued at discharge. Will recommend follow up in office as outpatient in 2 weeks to undergo full bone metabolism workup.   Raylan Hanton A. Carmie Kanner [facility] ?[phone]? (phone)

## 2018-09-26 NOTE — Progress Notes (Signed)
PROGRESS NOTE                                                                                                                                                                                                             Patient Demographics:    Kenneth [name], is a 63 y.o. 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 intake and 4-day history of diarrhea.  Patient was severely septic with fever, hypotension and acute kidney injury on presentation.  He was tested positive for C. difficile.  He even required vasopressors but quickly improved with 2 L IV fluid bolus.  CT of the abdomen pelvis showed pancolitis and several new fractures involving greater trochanter of the right femur, sacral fracture and L5 fracture (patient has had sacral fracture when he was hospitalized in May and orthopedics recommended weightbearing and nonsurgical intervention). Patient admitted to stepdown for severe sepsis and ATN secondary to C. difficile pancolitis.   Subjective:   Reported he has not had diarrhea since yesterday.  Communicating better today still quite tired.  Denies any pain.  Assessment  & Plan :    Principal Problem:   Severe sepsis ([place]) Secondary to C. difficile pancolitis.  Not sure if he was treated with antibiotic recently.  Blood culture 1/3 growing strep, sensitivities still pending.  Patient currently on oral  vancomycin and IV Flagyl.  IV fluids switched to bicarb.  Sepsis currently resolved except for tachycardic in the low 100s.  He is neutropenic as well. COVID-19 tested negative.  Active Problems: Acute metabolic encephalopathy (HCC) Suspect secondary to severe sepsis.  Underwent LP which was unremarkable.  Mental status currently at baseline.  ?strep bacteremia 1/3 blood culture growing strep anginosis which is pansensitive.  I will switch him to IV Rocephin.  Will consult ID given  his severe sepsis.  Right transverse process L5 fracture along with sacral fractures MRI done shows acute to subacute bilateral sacral alar fracture stable from prior CT.  New acute or subacute nondisplaced fracture of the bilateral transfer process of L5, mild diffuse soft tissue edema of the lower lumbar and sacral spine.  Central disc protrusion at L4-L5 with mild bilateral lateral recess stenosis. Orthopedics consult recommends nonoperative management with weightbearing as tolerated.  Check vitamin D level and placed on supplement if low.  Follow-up in office in 2 weeks to undergo full bone metabolism work-up.    HIV AIDS. CD4 of 45.  Was 266 months ago.  Says he is now following at [facility] and previously saw [name].  Patient states he has been adherent to his ART.  Acute tubular necrosis (HCC) Suspect prerenal with severe sepsis/C. difficile colitis.  Normal saline switch to IV bicarb drip by nephrology.  Creatinine still 3.1.  Avoid nephrotoxins.  Monitor for urinary retention.  Strict I's/O.  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 On  Prograf.  Holding Plaquenil and CellCept per nephrology.  Bactrim held ( resume once renal function further improved).  [name] spoke with transplant nephrology at [facility] who recommended holding CellCept until diarrhea improves.  Leukopenia WBC of 1.3 with ANC of 910.  Possibly due to sepsis.   Monitor closely.    Current chronic use of systemic steroids Was placed on IV hydrocortisone for sepsis, resume back to prednisone.  Oral ulcers Continue Magic mouthwash.  Placed on acyclovir.  History of mid and lower esophageal ulceration (as per endoscopy done at [facility] done on 6/17). On nystatin.  Surgical pathology not available on care everywhere.  Severe protein calorie malnutrition ([place]) Physical deconditioning PT recommends SNF.  Nutrition consulted.      Code Status : Full code  Family Communication  : None at bedside  Disposition Plan  : Possibly needs to return to SNF once improved, likely in the next 48 hours if renal function better.  Barriers For Discharge : Active symptoms  Consults  : Nephrology  Procedures  : CT abdomen, MRI lumbar spine  DVT Prophylaxis  : Subcutaneous Heparin  Lab Results  Component Value Date   PLT 156 09/26/2018    Antibiotics  :    Anti-infectives (From admission, onward)   Start     Dose/Rate Route Frequency Ordered Stop   09/25/18 1100  ceFEPIme (MAXIPIME) 2 g in sodium chloride 0.9 % 100 mL IVPB     2 g 200 mL/hr over 30 Minutes Intravenous Every 24 hours 09/25/18 1007     09/25/18 1030  acyclovir (ZOVIRAX) tablet 200 mg     200 mg Oral 3 times daily 09/25/18 1007     09/25/18 1000  sulfamethoxazole-trimethoprim (BACTRIM DS) 800-160 MG per tablet 1 tablet  Status:  Discontinued     1 tablet Oral Every M-W-F 09/24/18 0045 09/25/18 0930   09/24/18 1900  ceFEPIme (MAXIPIME) 1 g in sodium chloride 0.9 % 100 mL IVPB  Status:  Discontinued     1 g 200 mL/hr over 30 Minutes Intravenous Every 24 hours 09/23/18 1946 09/24/18 0115   09/24/18 1000  hydroxychloroquine (PLAQUENIL) tablet 200 mg     200 mg Oral 2 times daily 09/24/18 0045     09/24/18 1000  abacavir-dolutegravir-lamiVUDine (TRIUMEQ) 600-50-300 MG per tablet 1 tablet     1 tablet Oral Daily 09/24/18 0045  09/24/18 0600  vancomycin (VANCOCIN) 50 mg/mL oral solution  500 mg     500 mg Oral 3 times daily before meals & bedtime 09/24/18 0055 10/08/18 0759   09/24/18 0200  metroNIDAZOLE (FLAGYL) IVPB 500 mg     500 mg 100 mL/hr over 60 Minutes Intravenous Every 8 hours 09/24/18 0055 10/08/18 0159   09/23/18 1900  ceFEPIme (MAXIPIME) 2 g in sodium chloride 0.9 % 100 mL IVPB     2 g 200 mL/hr over 30 Minutes Intravenous  Once 09/23/18 1856 09/23/18 2011   09/23/18 1900  vancomycin (VANCOCIN) IVPB 1000 mg/200 mL premix     1,000 mg 200 mL/hr over 60 Minutes Intravenous  Once 09/23/18 1856 09/23/18 2144        Objective:   Vitals:   09/25/18 2256 09/26/18 0000 09/26/18 0421 09/26/18 0958  BP: (!) 153/92 133/85 126/76 123/77  Pulse: (!) 118 (!) 107 100   Resp:  20 18   Temp:  97.8 F (36.6 C) 97.9 F (36.6 C)   TempSrc:  Oral Oral   SpO2:  100% 100%   Weight:   55.6 kg   Height:        Wt Readings from Last 3 Encounters:  09/26/18 55.6 kg  09/20/18 53.4 kg  09/19/18 53.4 kg     Intake/Output Summary (Last 24 hours) at 09/26/2018 1056 Last data filed at 09/26/2018 0825 Gross per 24 hour  Intake 1410.03 ml  Output 325 ml  Net 1085.03 ml    Physical exam Fatigue, more interactive today HEENT: Moist mucosa, supple neck, ulcer over the left lower lip better Chest: Clear bilaterally CVS: Normal S1 and S2, no murmurs GI: Soft, nontender, nondistended, bowel sounds present Musculoskeletal: Warm, no edema CNS: Alert and oriented     Data Review:    CBC Recent Labs  Lab 09/23/18 1854 09/24/18 0408 09/25/18 0726 09/26/18 0229 09/26/18 0751  WBC 2.6* 2.5* 2.9* 1.6* 1.3*  HGB 15.2 13.7 12.9* 11.6* 12.4*  HCT 44.8 40.2 36.8* 32.5* 34.9*  PLT 273 203 199 175 156  MCV 92.4 91.6 88.7 86.4 86.4  MCH 31.3 31.2 31.1 30.9 30.7  MCHC 33.9 34.1 35.1 35.7 35.5  RDW 13.6 13.6 13.7 13.6 13.5  LYMPHSABS 0.8  --   --   --  0.0*  MONOABS 0.3  --   --   --  0.4  EOSABS 0.0  --   --   --  0.0  BASOSABS 0.0  --   --   --  0.0    Chemistries   Recent Labs  Lab 09/23/18 1854 09/24/18 0408 09/25/18 0726 09/26/18 0229  NA 133* 131* 133* 133*  K 4.5 4.6 4.3 3.7  CL 101 103 109 106  CO2 16* 14* 13* 17*  GLUCOSE 141* 104* 109* 213*  BUN 74* 77* 91* 94*  CREATININE 3.68* 3.51* 2.99* 3.11*  CALCIUM 8.4* 7.6* 6.7* 6.5*  MG 2.7*  --   --   --   AST 25  --   --   --   ALT 13  --   --   --   ALKPHOS 90  --   --   --   BILITOT 0.6  --   --   --    ------------------------------------------------------------------------------------------------------------------ No results for input(s): CHOL, HDL, LDLCALC, TRIG, CHOLHDL, LDLDIRECT in the last 72 hours.  No results found for: HGBA1C ------------------------------------------------------------------------------------------------------------------ No results for input(s): TSH, T4TOTAL, T3FREE, THYROIDAB in the last 72 hours.  Invalid input(s): FREET3 ------------------------------------------------------------------------------------------------------------------ No results for input(s): VITAMINB12, FOLATE, FERRITIN, TIBC, IRON, RETICCTPCT in the last 72 hours.  Coagulation profile No results for input(s): INR, PROTIME in the last 168 hours.  No results for input(s): DDIMER in the last 72 hours.  Cardiac Enzymes No results for input(s): CKMB, TROPONINI, MYOGLOBIN in the last 168 hours.  Invalid input(s): CK ------------------------------------------------------------------------------------------------------------------    Component Value Date/Time   BNP 176.0 (H) 09/23/2018 1824    Inpatient Medications  Scheduled Meds:  abacavir-dolutegravir-lamiVUDine  1 tablet Oral Daily   acyclovir  200 mg Oral TID   amitriptyline  10 mg Oral QHS   aspirin  81 mg Oral Daily   cholecalciferol  2,000 Units Oral Daily   feeding supplement (ENSURE ENLIVE)  237 mL Oral TID BM   heparin  5,000 Units Subcutaneous Q8H   hydroxychloroquine  200 mg Oral BID   magic mouthwash  5  mL Oral TID   multivitamin with minerals  1 tablet Oral Daily   nystatin  5 mL Oral QID   predniSONE  10 mg Oral Daily   saccharomyces boulardii  250 mg Oral Daily   tacrolimus  1.5 mg Oral BID   tamsulosin  0.4 mg Oral QHS   vancomycin  500 mg Oral TID AC & HS   Continuous Infusions:  sodium chloride 1,000 mL (09/26/18 0107)   ceFEPime (MAXIPIME) IV Stopped (09/25/18 1324)   metronidazole 500 mg (09/26/18 0950)    sodium bicarbonate  infusion 1000 mL 100 mL/hr at 09/26/18 0250   PRN Meds:.sodium chloride, acetaminophen **OR** acetaminophen, alum & mag hydroxide-simeth, HYDROcodone-acetaminophen, ondansetron **OR** ondansetron (ZOFRAN) IV  Micro Results Recent Results (from the past 240 hour(s))  Blood Culture (routine x 2)     Status: Abnormal   Collection Time: 09/23/18  6:54 PM   Specimen: BLOOD  Result Value Ref Range Status   Specimen Description BLOOD SITE NOT SPECIFIED  Final   Special Requests   Final    BOTTLES DRAWN AEROBIC AND ANAEROBIC Blood Culture adequate volume   Culture  Setup Time   Final    GRAM POSITIVE COCCI IN PAIRS AND CHAINS ANAEROBIC BOTTLE ONLY CRITICAL RESULT CALLED TO, READ BACK BY AND VERIFIED WITH: A. Love PharmD 14:20 09/24/18 ([email or web address]) Performed at [facility], [place] [address] [place], [place] [address]    Culture STREPTOCOCCUS ANGINOSIS (A)  Final   Report Status 09/26/2018 FINAL  Final   Organism ID, Bacteria STREPTOCOCCUS ANGINOSIS  Final      Susceptibility   Streptococcus anginosis - MIC*    PENICILLIN <=0.06 SENSITIVE Sensitive     CEFTRIAXONE 0.5 SENSITIVE Sensitive     ERYTHROMYCIN <=0.12 SENSITIVE Sensitive     LEVOFLOXACIN 0.5 SENSITIVE Sensitive     VANCOMYCIN 1 SENSITIVE Sensitive     * STREPTOCOCCUS ANGINOSIS  Blood Culture (routine x 2)     Status: None (Preliminary result)   Collection Time: 09/23/18  6:54 PM   Specimen: Site Not Specified; Blood  Result Value Ref Range Status   Specimen Description SITE  NOT SPECIFIED  Final   Special Requests   Final    BOTTLES DRAWN AEROBIC ONLY Blood Culture adequate volume   Culture   Final    NO GROWTH 3 DAYS Performed at [facility], [address] [place], [place] [address]    Report Status PENDING  Incomplete  Blood Culture ID Panel (Reflexed)     Status: Abnormal   Collection Time: 09/23/18  6:54 PM  Result Value Ref Range Status   Enterococcus species NOT DETECTED NOT DETECTED Final   Listeria monocytogenes NOT DETECTED NOT DETECTED Final   Staphylococcus species NOT DETECTED NOT DETECTED Final   Staphylococcus aureus (BCID) NOT DETECTED NOT DETECTED Final   Streptococcus species DETECTED (A) NOT DETECTED Final    Comment: Not Enterococcus species, Streptococcus agalactiae, Streptococcus pyogenes, or Streptococcus pneumoniae. CRITICAL RESULT CALLED TO, READ BACK BY AND VERIFIED WITH: A. Love PharmD 14:20 09/24/18 ([email or web address])    Streptococcus agalactiae NOT DETECTED NOT DETECTED Final   Streptococcus pneumoniae NOT DETECTED NOT DETECTED Final   Streptococcus pyogenes NOT DETECTED NOT DETECTED Final   Acinetobacter baumannii NOT DETECTED NOT DETECTED Final   Enterobacteriaceae species NOT DETECTED NOT DETECTED Final   Enterobacter cloacae complex NOT DETECTED NOT DETECTED Final   Escherichia coli NOT DETECTED NOT DETECTED Final   Klebsiella oxytoca NOT DETECTED NOT DETECTED Final   Klebsiella pneumoniae NOT DETECTED NOT DETECTED Final   Proteus species NOT DETECTED NOT DETECTED Final   Serratia marcescens NOT DETECTED NOT DETECTED Final   Haemophilus influenzae NOT DETECTED NOT DETECTED Final   Neisseria meningitidis NOT DETECTED NOT DETECTED Final   Pseudomonas aeruginosa NOT DETECTED NOT DETECTED Final   Candida albicans NOT DETECTED NOT DETECTED Final   Candida glabrata NOT DETECTED NOT DETECTED Final   Candida krusei NOT DETECTED NOT DETECTED Final   Candida parapsilosis NOT DETECTED NOT DETECTED Final   Candida tropicalis NOT  DETECTED NOT DETECTED Final    Comment: Performed at [facility], [place] [address] [place],  [address]  SARS Coronavirus 2 (CEPHEID - Performed in [facility]), Hosp Order     Status: None   Collection Time: 09/23/18  7:04 PM   Specimen: Nasopharyngeal Swab  Result Value Ref Range Status   SARS Coronavirus 2 NEGATIVE NEGATIVE Final    Comment: (NOTE) If result is NEGATIVE SARS-CoV-2 target nucleic acids are NOT DETECTED. The SARS-CoV-2 RNA is generally detectable in upper and lower  respiratory specimens during the acute phase of infection. The lowest  concentration of SARS-CoV-2 viral copies this assay can detect is 250  copies / mL. A negative result does not preclude SARS-CoV-2 infection  and should not be used as the sole basis for treatment or other  patient management decisions.  A negative result may occur with  improper specimen collection / handling, submission of specimen other  than nasopharyngeal swab, presence of viral mutation(s) within the  areas targeted by this assay, and inadequate number of viral copies  (<250 copies / mL). A negative result must be combined with clinical  observations, patient history, and epidemiological information. If result is POSITIVE SARS-CoV-2 target nucleic acids are DETECTED. The SARS-CoV-2 RNA is generally detectable in upper and lower  respiratory specimens dur ing the acute phase of infection.  Positive  results are indicative of active infection with SARS-CoV-2.  Clinical  correlation with patient history and other diagnostic information is  necessary to determine patient infection status.  Positive results do  not rule out bacterial infection or co-infection with other viruses. If result is PRESUMPTIVE POSTIVE SARS-CoV-2 nucleic acids MAY BE PRESENT.   A presumptive positive result was obtained on the submitted specimen  and confirmed on repeat testing.  While 2019 novel coronavirus  (SARS-CoV-2) nucleic  acids may be present in the submitted sample  additional confirmatory testing may be necessary for epidemiological  and / or clinical management purposes  to differentiate  between  SARS-CoV-2 and other Sarbecovirus currently known to infect humans.  If clinically indicated additional testing with an alternate test  methodology [phone]) is advised. The SARS-CoV-2 RNA is generally  detectable in upper and lower respiratory sp ecimens during the acute  phase of infection. The expected result is Negative. Fact Sheet for Patients:  StrictlyIdeas.no Fact Sheet for Healthcare Providers: BankingDealers.co.za This test is not yet approved or cleared by the [place] FDA and has been authorized for detection and/or diagnosis of SARS-CoV-2 by FDA under an Emergency Use Authorization (EUA).  This EUA will remain in effect (meaning this test can be used) for the duration of the COVID-19 declaration under Section 564(b)(1) of the Act, 21 U.S.C. section 360bbb-3(b)(1), unless the authorization is terminated or revoked sooner. Performed at [facility], [place] [address] [place], [place] [address]   Urine culture     Status: None   Collection Time: 09/23/18  8:35 PM   Specimen: Urine, Catheterized  Result Value Ref Range Status   Specimen Description URINE, CATHETERIZED  Final   Special Requests NONE  Final   Culture   Final    NO GROWTH Performed at [facility], [place] [address], [place], [place] [address]    Report Status 09/24/2018 FINAL  Final  C difficile quick scan w PCR reflex     Status: Abnormal   Collection Time: 09/23/18  8:35 PM   Specimen: Urine, Catheterized; Stool  Result Value Ref Range Status   C Diff antigen POSITIVE (A) NEGATIVE Final   C Diff toxin POSITIVE (A) NEGATIVE Final   C Diff interpretation Toxin producing C. difficile detected.  Final    Comment: CRITICAL RESULT CALLED TO, READ BACK BY AND VERIFIED  WITH: A DENNIS RN 09/24/18 0015 JDW Performed at [facility], [address] [place], [place] [address]   Gastrointestinal Panel by PCR , Stool     Status: None   Collection Time: 09/23/18  8:35 PM   Specimen: Stool  Result Value Ref Range Status   Campylobacter species NOT DETECTED NOT DETECTED Final   Plesimonas shigelloides NOT DETECTED NOT DETECTED Final   Salmonella species NOT DETECTED NOT DETECTED Final   Yersinia enterocolitica NOT DETECTED NOT DETECTED Final   Vibrio species NOT DETECTED NOT DETECTED Final   Vibrio cholerae NOT DETECTED NOT DETECTED Final   Enteroaggregative E coli (EAEC) NOT DETECTED NOT DETECTED Final   Enteropathogenic E coli (EPEC) NOT DETECTED NOT DETECTED Final   Enterotoxigenic E coli (ETEC) NOT DETECTED NOT DETECTED Final   Shiga like toxin producing E coli (STEC) NOT DETECTED NOT DETECTED Final   Shigella/Enteroinvasive E coli (EIEC) NOT DETECTED NOT DETECTED Final   Cryptosporidium NOT DETECTED NOT DETECTED Final   Cyclospora cayetanensis NOT DETECTED NOT DETECTED Final   Entamoeba histolytica NOT DETECTED NOT DETECTED Final   Giardia lamblia NOT DETECTED NOT DETECTED Final   Adenovirus F40/41 NOT DETECTED NOT DETECTED Final   Astrovirus NOT DETECTED NOT DETECTED Final   Norovirus GI/GII NOT DETECTED NOT DETECTED Final   Rotavirus A NOT DETECTED NOT DETECTED Final   Sapovirus (I, II, IV, and V) NOT DETECTED NOT DETECTED Final    Comment: Performed at [facility], [place] [place], [place] [address]  CSF culture     Status: None (Preliminary result)   Collection Time: 09/23/18 10:12 PM   Specimen: CSF; Cerebrospinal Fluid  Result Value Ref Range Status   Specimen Description CSF  Final   Special Requests NONE  Final   Gram Stain NO WBC SEEN NO ORGANISMS SEEN CYTOSPIN SMEAR   Final   Culture   Final    NO GROWTH 3 DAYS Performed at [facility], [address], [place], [place] [address]    Report Status  PENDING  Incomplete    Radiology Reports Ct Abdomen Pelvis Wo Contrast  Result Date: 09/24/2018 CLINICAL DATA:  64 year old male with diarrhea and sepsis. History of renal abscess and HIV. EXAM: CT ABDOMEN AND PELVIS WITHOUT CONTRAST TECHNIQUE: Multidetector CT imaging of the abdomen and pelvis was performed following the standard protocol without IV contrast. COMPARISON:  CT of the pelvis dated 08/25/2018 and CT of the abdomen pelvis dated 06/22/2014 FINDINGS: Evaluation of this exam is limited in the absence of intravenous contrast. Evaluation is also limited due to cachexia. Lower chest: The visualized lung bases are clear. No intra-abdominal free air. Probable mesenteric edema. Hepatobiliary: The liver is grossly unremarkable. Layering stones noted within the gallbladder. No pericholecystic fluid. Pancreas: The pancreas is grossly unremarkable as visualized. Spleen: Normal in size without focal abnormality. Adrenals/Urinary Tract: The adrenal glands are grossly unremarkable. Atrophic left kidney. No hydronephrosis. 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 There is a punctate stone in the region of the transplant hilum. Mild fullness of the renal transplant collecting system. No peritransplant fluid collection. The urinary bladder is decompressed around a Foley catheter and contains a small pocket of air. Stomach/Bowel: There is diffuse inflammatory changes and thickening of the colon and rectosigmoid consistent with pancolitis. Correlation with clinical exam and stool cultures recommended. No bowel obstruction. Surgical clips in the upper abdomen noted. The appendix is not visualized. Vascular/Lymphatic: The abdominal aorta and IVC are grossly unremarkable on this noncontrast CT. No portal venous gas. Evaluation of the lymph nodes is very limited. No obvious adenopathy. Reproductive: The prostate gland is poorly visualized. Other: Severe cachexia. Musculoskeletal: Nondisplaced fracture of the  greater trochanter of the right femur. 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 Old left pubic bone fractures. Fractures of the sacral alum as seen on the prior CT with no healing. Nondisplaced fracture of the right L5 transverse process. IMPRESSION: 1. Pancolitis. Correlation with clinical exam and stool cultures recommended. No bowel obstruction. 2. Cholelithiasis. 3. Right lower quadrant renal transplant. No hydronephrosis. 4. Fractures of the greater trochanter of the right femur, right transverse process of L5, sacrum, and left pubic bone. 5. Cachexia. Electronically Signed   By: Anner Crete M.D.   On: 09/24/2018 03:44   Mr Lumbar Spine Wo Contrast  Result Date: 09/24/2018 CLINICAL DATA:  Initial evaluation for acute sacral fractures. EXAM: MRI LUMBAR SPINE WITHOUT CONTRAST TECHNIQUE: Multiplanar, multisequence MR imaging of the lumbar spine was performed. No intravenous contrast was administered. COMPARISON:  Prior CT from earlier the same day. FINDINGS: Segmentation: Standard. Lowest well-formed disc labeled the L5-S1 level. Alignment: Mild levoscoliosis. Alignment otherwise normal with preservation of the normal lumbar lordosis. No listhesis or subluxation. Vertebrae: Abnormal marrow edema seen involving the left greater than right sacral ala, compatible with acute to subacute nondisplaced fractures. Findings stable from previous CT. Subtle linear signal intensity extending through the transverse processes of L5 also likely reflect acute to subacute nondisplaced fractures, also seen on prior CT. Vertebral body heights maintained with no other acute or chronic fracture identified. Bone marrow signal intensity diffusely decreased on T1 weighted imaging, most likely related to patient's underlying renal disease and anemia. No  discrete or worrisome osseous lesions. No other abnormal marrow edema. Conus medullaris and cauda equina: Conus extends to the L1 level. Conus and cauda equina  appear normal. Paraspinal and other soft tissues: Soft tissue edema seen about the lower lumbar spine and sacrum, likely posttraumatic in nature. No discrete hematoma or other collection. Changes compatible with acute colitis noted about the visualized descending colon. Right lower quadrant renal transplant partially visualized. Mild scattered free fluid noted within the visualized abdomen and pelvis. Disc levels: L1-2:  Unremarkable. L2-3:  Unremarkable. L3-4:  Unremarkable. L4-5: Mild diffuse disc bulge with disc desiccation. Superimposed broad-based central disc protrusion mildly indents the ventral thecal sac. Mild facet hypertrophy. Resultant mild bilateral lateral recess stenosis without neural impingement. Foramina remain patent. L5-S1: Chronic intervertebral disc space narrowing with diffuse disc bulge and disc desiccation. Superimposed central disc protrusion with slight inferior angulation. Protruding disc indents the ventral thecal sac, slightly asymmetric to the right. Protruding disc closely approximates the descending S1 nerve roots without frank neural impingement, also greater on the right. Central canal remains patent. Mild bilateral L5 foraminal stenosis without impingement. IMPRESSION: 1. Acute to subacute bilateral sacral alar fractures, stable from prior CT. 2. Probable additional acute to subacute nondisplaced fractures of the bilateral transverse processes of L5, better seen on prior CT. 3. Mild diffuse soft tissue edema about the lower lumbar spine and sacrum, likely posttraumatic/reactive in nature. No discrete hematoma or other collection. 4. Small central disc protrusion at L5-S1, closely approximating the right greater than left descending S1 nerve roots without frank impingement. 5. Central disc protrusion at L4-5 with resultant mild bilateral lateral recess stenosis. No frank impingement. 6. Findings consistent with acute colitis about the visualized descending colon, better seen on  prior CT. Electronically Signed   By: Jeannine Boga M.D.   On: 09/24/2018 17:02   US Renal Transplant W/doppler  Result Date: 09/25/2018 CLINICAL DATA:  Acute kidney injury EXAM: ULTRASOUND OF RENAL TRANSPLANT WITH RENAL DOPPLER ULTRASOUND TECHNIQUE: Ultrasound examination of the renal transplant was performed with gray-scale, color and duplex doppler evaluation. COMPARISON:  CT dated September 24, 2018 FINDINGS: Transplant kidney location: RLQ Transplant Kidney: Renal measurements: 11.6 x 4.7 x 6.3 cm = volume: 158mL. The echogenicity is within normal limits. There is no hydronephrosis. There appears to be a small calcification in the upper pole measuring approximately 0.7 cm. Color flow in the main renal artery:  Yes Color flow in the main renal vein:  Yes Duplex Doppler Evaluation: Main Renal Artery Resistive Index: 0.76 Venous waveform in main renal vein:  Present Intrarenal resistive index in upper pole:  0.69 (normal 0.6-0.8; equivocal 0.8-0.9; abnormal >= 0.9) Intrarenal resistive index in lower pole: 0.55 (normal 0.6-0.8; equivocal 0.8-0.9; abnormal >= 0.9) Bladder: Normal for degree of bladder distention. Other findings:  None. IMPRESSION: 1. Right lower quadrant pelvic transplant kidney with no evidence of hydronephrosis. 2. Normal resistive indices as detailed above. 3. Possible nonobstructing 7 mm stone in the upper pole of the transplant kidney. Electronically Signed   By: Constance Holster M.D.   On: 09/25/2018 21:30   Dg Chest Port 1 View  Result Date: 09/23/2018 CLINICAL DATA:  62 year old male with sepsis. EXAM: PORTABLE CHEST 1 VIEW COMPARISON:  Chest radiograph dated 01/22/2017 and CT dated 06/22/2014 FINDINGS: There is mild eventration of the left hemidiaphragm. There is no focal consolidation, pleural effusion, or pneumothorax. Mild prominence of the pulmonary vasculature with cephalization may represent mild congestion. Apparent 9 mm nodular density over the posterior right  sixth rib  likely an area of old healed fracture. A pulmonary nodule is less likely. Nonemergent CT may provide better evaluation if clinically indicated. Additional old healed right posterior rib fractures noted. There is a 5 mm nodular density over the left mid lung field, possibly a granuloma. The cardiac silhouette is within normal limits. There is dextroscoliosis of the thoracic spine. No acute osseous pathology. IMPRESSION: 1. No focal consolidation. 2. Possible mild vascular congestion. 3. Probable focus of old healed rib fracture versus less likely a 9 mm right pulmonary nodule. CT may provide better evaluation on a nonemergent basis. Electronically Signed   By: Anner Crete M.D.   On: 09/23/2018 19:11    Time Spent in minutes 35   Glenford Garis M.D on 09/26/2018 at 10:56 AM  Between 7am to 7pm - Pager - [phone]  After 7pm go to [email or web address] - password [facility]  [facility] -  Office  [phone]

## 2018-09-26 NOTE — Progress Notes (Signed)
Discussed with nephrology who recommended given his persistent ATN despite hydration, his transplant status and other acute issues he should be transferred to [facility] under care of his transplant nephrologist Dr. Aquilla Hacker.  She has spoken with him and patient has been accepted there.  I spoke with Sonia Baller with the transfer center [phone] and provided patient's detail.  Patient has been accepted but since they are on diversion patient may not get admitted there today. Ordered ultrasound of the abdomen for complaint of left upper quadrant pain.

## 2018-09-26 NOTE — Progress Notes (Addendum)
[facility] Progress Note  Name: Kenneth [name] MRN: [id number] DOB: 1955-07-21  Chief Complaint:  Diarrhea   Subjective:  He had a transplant ultrasound with no evidence of hydro and possible nonobstructing 7 mm stone in the upper pole of the transplant kidney.  Lower urine output 375 mL and one unquantified void over 6/24.  Bladder scan 160-180 this AM.  He has been having left upper quadrant abdominal pain today.  I have spoken with the team re: the pain.    Review of systems:  Reports diarrhea yesterday - tells me none today  LUQ abdominal pain  Denies n/v Denies shortness of breath  States he urinated around the condom cath --------------- Background on consult:  Kenneth [name] is a 63 y.o. male with a history of ESRD s/p renal transplant at [facility] 04/13/2018 as well as HIV who presented with diarrhea and decreased PO intake.  He was found to have C dif colitis.  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  Note Cr 1.1 on 09/05/18.   He was found to be c dif positive and strep bacteremia.  He has been on PO vanc and IV flagyl and cefepime for the bacteremia.  He has also received normal saline.  Creatinine has improved from 3.51 to 2.99 on consult.  Note SARS coronavirus 2 negative.  He had a recent endoscopy at [facility] which demonstrated ulcerations in the mid and distal esophagus per charting.  No pain over transplant.  Lives at a facility and reports compliance with meds.  Appears a FK506 level drawn earlier today may not be a trough.  He has a foley and this is about to be changed to a condom cath per primary team.  Abdominal CT with punctate stone in the region of the transplant hilum. Mild fullness of the renal transplant collecting system. No peritransplant fluid collection. The urinary bladder is decompressed around a Foley catheter and contains a small pocket of air.  We discussed his renal function.  If his renal function  declines to the point that he would need it, he would want hemodialysis.  Spoke with transplant neph at [facility].  Hold MMF for now.  Will reassess restarting in 1-2 days once diarrhea improved.     Intake/Output Summary (Last 24 hours) at 09/26/2018 1319 Last data filed at 09/26/2018 1304 Gross per 24 hour  Intake 1000.92 ml  Output 225 ml  Net 775.92 ml    Vitals:  Vitals:   09/26/18 0000 09/26/18 0421 09/26/18 0958 09/26/18 1159  BP: 133/85 126/76 123/77 126/73  Pulse: (!) 107 100  (!) 111  Resp: 20 18  16   Temp: 97.8 F (36.6 C) 97.9 F (36.6 C)  99.2 F (37.3 C)  TempSrc: Oral Oral  Axillary  SpO2: 100% 100%  97%  Weight:  55.6 kg    Height:         Physical Exam:  General thin adult male in bed in no acute distress at rest HEENT dry mm; normocephalic atraumatic extraocular movements intact sclera anicteric Neck supple trachea midline Lungs clear to auscultation bilaterally normal work of breathing at rest  Heart tachycardia, S1S2 no rub Abdomen thin nondistended; tender LUQ  Extremities no lower extremity edema  Psych normal mood and affect GU - condom cath  RLQ allograft is nontender   Medications reviewed   Labs:  BMP Latest Ref Rng & Units 09/26/2018 09/25/2018 09/24/2018  Glucose  70 - 99 mg/dL 213(H) 109(H) 104(H)  BUN 8 - 23 mg/dL 94(H) 91(H) 77(H)  Creatinine 0.61 - 1.24 mg/dL 3.11(H) 2.99(H) 3.51(H)  BUN/Creat Ratio 6 - 22 (calc) - - -  Sodium 135 - 145 mmol/L 133(L) 133(L) 131(L)  Potassium 3.5 - 5.1 mmol/L 3.7 4.3 4.6  Chloride 98 - 111 mmol/L 106 109 103  CO2 22 - 32 mmol/L 17(L) 13(L) 14(L)  Calcium 8.9 - 10.3 mg/dL 6.5(L) 6.7(L) 7.6(L)     Assessment/Plan:   # AKI  - Has pre-renal insults in the setting of severe cdif colitis and sepsis. Concern for possibly elevated prograf level - levels sent are still pending and will not be accurate given collection times   - Renal function is slightly better with hydration and supportive care but no pronounced  improvement  - Continue hydration with bicarbonate gtt  - Continue flomax  - Repeat UA and up/cr ratio - still not collected  - Monitor for retention and low threshold for foley reinsertion - Strict ins/outs   # Metabolic acidosis - GI losses as well as AKI  - Continue bicarbonate infusion   # ESRD s/p renal transplant  - Immunosuppression as below.  Cr 1.1 on 09/05/18 - Transplant ultrasound ok as above   # Severe c difficile colitis  - on PO vanc as well as IV flagyl  # Streptococcus bacteremia - on ceftriaxone    # Immunosuppression  - Normally on cellcept 250 mg BID and prograf 1.5 mg BID and prednisone 10 mg daily  - I have contacted transplant team at [facility].  For now, we have discontinued cellcept  - Continue pred and prograf for now.  Concern for possibly supratherapeutic FK506 level and no trough available.  - Two FK506 troughs are pending and neither is accurate as it appears they were obtained at 2:29 am on 6/25 and at 9:22 am on 6/24 after an 8:41 am dose   # Hematuria  - have requested repeat UA   # SLE  - on hydroxychloroquine; stopping cellcept for now as above   # HIV  - Per transplant ID would continue current regimen   # Oral ulcers - Per primary team the PO acyclovir was started for oral ulcers  # Hypocalcemia - Replete IV calcium x 1   Given lack of improvement and recent transplant status I have spoken with [name] - Dr. Aquilla Hacker and he has agreed that the patient would be better served at their transplant center.  He provided the number of [phone] for their transfer center.  I have discussed with the primary team here and they agree with transfer and they will coordinate same.  Spoke with the patient regarding the recommendation for transfer.     Claudia Desanctis, MD 09/26/2018 1:19 PM

## 2018-09-26 NOTE — Consult Note (Signed)
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 Difficile colitis. Current antimicrobial therapy with ceftriaxone, oral vancomycin and IV metronidazole. HIV/AIDS previously followed by [name] at [facility] and last seen by transplant ID at [facility] with undetectable viral load.   AIDS/HIV - Appears to have well controlled disease with good adherence and tolerance to Triumeq. Last viral load was undetectable and CD4 count of 98 now decreased to 45 likely the result of sepsis and bacteremia. Continue current dose of Triumeq. Will likely need to restart Bactrim or Dapsone for OI prophylaxis.  Streptococcus Bacteremia - Narrow antimicrobial therapy to penicillin from ceftriaxone. Repeat blood cultures are in process.  C. Difficile Colitis - Agree with vancomycin and IV metronidazole. Unclear of any triggering events/medications.   Acute Kidney Injury - Creatinine of 3.11 despite hydration and supportive care.. Nephrology recommending transfer to [facility] given his kidney transplant status.  Oral Ulcers - Started on acyclovir with concern for HSV origin. Continue acyclovir per primary team.    Principal Problem:   Severe sepsis ([place]) Active Problems:   HIV disease ([place])   AKI (acute kidney injury) ([place])   SLE (systemic lupus erythematosus) ([facility])   History of renal transplant   Current chronic use of systemic steroids   C. difficile diarrhea   . abacavir-dolutegravir-lamiVUDine  1 tablet Oral Daily  . acyclovir  200 mg Oral TID  . amitriptyline  10 mg Oral  QHS  . aspirin  81 mg Oral Daily  . cholecalciferol  2,000 Units Oral Daily  . feeding supplement (ENSURE ENLIVE)  237 mL Oral TID BM  . heparin  5,000 Units Subcutaneous Q8H  . hydroxychloroquine  200 mg Oral BID  . magic mouthwash  5 mL Oral TID  . multivitamin with minerals  1 tablet Oral Daily  . nystatin  5 mL Oral QID  . predniSONE  10 mg Oral Daily  . saccharomyces boulardii  250 mg Oral Daily  . tacrolimus  1.5 mg Oral BID  . tamsulosin  0.4 mg Oral QHS  . vancomycin  500 mg Oral TID AC & HS     HPI: Kenneth [name] is a 63 y.o. male with previous medical history of systemic lupus erythematous diagnosed in 2015, lupus nephritis, HIV infection diagnosed in 2009, DVT, and end-stage renal disease now status post renal transplant (DDKT on 04/13/18; PRA 0/0; L kidney to RLQ, 1 a/v/u, KDPI 61%, CMV +/+, HIV +) presenting with abdominal pain, poor oral intake, diarrhea, tachycardia and hypotension at his skilled nursing facility.   Febrile with temperature of 101.1 and tachycardic and tachypnea.Lactic acid elevated 2.7, WBC count of 2.6, creatinine 3.68, and hypomagnesia at 2.7.  Chest x-ray with no focal consolidation and mild vascular congestion.  CT scan of the abdomen and pelvis with pancolitis and without bowel obstruction.  There is no hydronephrosis in the right lower quadrant renal transplant.  Fractures of the greater trochanter of the right femur, right transverse process of L5, sacrum,  and left pubic bone were noted.  Follow-up MRI for evaluation of sacral fractures showing acute to subacute bilateral sacral alar fractures with probable additional acute to subacute nondisplaced fracture of the bilateral transverse processes of L5.  No discrete hematoma or fluid collections.  Nephrology consulted for management of immunosuppression.  Blood cultures positive for Streptococcus Anginosis.  Repeat blood cultures on 09/26/2018 are pending. Started on ceftriaxone.  CSF unremarkable for  infection with white blood cell count of 1, protein of 49, glucose 66, and cultures without growth to date.  C. difficile testing of stool in 6/22 positive for antigen, toxin, and PCR. Currently on oral vancomycin and Flagyl.   Kenneth [name] was last seen in transplant ID for HIV follow-up.  Diagnosed with HIV in 2008 and previously on Triumeq.  His viral load at that time was undetectable with CD4 count of 98. He had good adherence and tolerance to Triumeq. Current CD4 count has dropped to 45.   Kenneth [name] is having increased pain in his abdomen. Unable to provide a significant history at the present time.    Review of Systems: Review of Systems  Constitutional: Negative for chills, fever and weight loss.  Respiratory: Negative for cough, shortness of breath and wheezing.   Cardiovascular: Negative for chest pain and leg swelling.  Gastrointestinal: Positive for abdominal pain. Negative for constipation, diarrhea, nausea and vomiting.  Skin: Negative for rash.     Past Medical History:  Diagnosis Date  . Achalasia   . Candida infection, esophageal ([place]) 08/2010   a. 2012 - noted on EGD.  Marland Kitchen DVT (deep venous thrombosis) ([place]) ~ 04/2014   ?RLE  . Dysphagia 2008   post heller myotomy/toupee fundoplication.    . ESRD (end stage renal disease) on dialysis [facility])    "MWF: Kenneth [name]" (01/22/2017)  . Fall 01/22/2017   mechanical fall w/multiple fractures/notes 01/22/2017  . GERD (gastroesophageal reflux disease)    hx (01/22/2017)  . Hemorrhoids, internal   . History of blood transfusion 03/2014   "low counts"  . History of gout   . History of stomach ulcers   . HIV infection ([place]) dx ~ 2009   a. 02/13/2014 CD4 = 240 - undetectable viral load.  . Hyperlipidemia   . Hypertension    hx (01/22/2017)  . Insomnia   . Lupus nephritis ([place])   . Pancytopenia ([place])   . Premature ventricular contractions   . Renal abscess   . SLE (systemic lupus erythematosus) ([place]) dx'd 2015    Social  History   Tobacco Use  . Smoking status: Never Smoker  . Smokeless tobacco: Never Used  Substance Use Topics  . Alcohol use: No  . Drug use: No    Family History  Problem Relation Age of Onset  . Colon cancer Father        deceased  . Other Mother        s/p pacemaker - alive and well.  . Hypertension Mother   . Other Other        5 brothers, 3 sisters - alive and well.    No Known Allergies  OBJECTIVE: Blood pressure 126/73, pulse (!) 111, temperature 99.2 F (37.3 C), temperature source Axillary, resp. rate 16, height 5\' 11"  (1.803 m), weight 55.6 kg, SpO2 97 %.  Physical Exam Constitutional:      General: He is not in acute distress.    Appearance: He is well-developed. He is cachectic. He is ill-appearing.  Cardiovascular:  Rate and Rhythm: Regular rhythm. Tachycardia present.     Heart sounds: Normal heart sounds.  Pulmonary:     Effort: Pulmonary effort is normal.     Breath sounds: Normal breath sounds.  Abdominal:     General: Abdomen is flat. There is no distension.     Palpations: There is no mass.     Tenderness: There is abdominal tenderness.  Skin:    General: Skin is warm and dry.  Neurological:     Mental Status: He is alert.  Psychiatric:        Mood and Affect: Mood normal.     Lab Results Lab Results  Component Value Date   WBC 1.3 (LL) 09/26/2018   HGB 12.4 (L) 09/26/2018   HCT 34.9 (L) 09/26/2018   MCV 86.4 09/26/2018   PLT 156 09/26/2018    Lab Results  Component Value Date   CREATININE 3.11 (H) 09/26/2018   BUN 94 (H) 09/26/2018   NA 133 (L) 09/26/2018   K 3.7 09/26/2018   CL 106 09/26/2018   CO2 17 (L) 09/26/2018    Lab Results  Component Value Date   ALT 13 09/23/2018   AST 25 09/23/2018   ALKPHOS 90 09/23/2018   BILITOT 0.6 09/23/2018     Microbiology: Recent Results (from the past 240 hour(s))  Blood Culture (routine x 2)     Status: Abnormal   Collection Time: 09/23/18  6:54 PM   Specimen: BLOOD  Result  Value Ref Range Status   Specimen Description BLOOD SITE NOT SPECIFIED  Final   Special Requests   Final    BOTTLES DRAWN AEROBIC AND ANAEROBIC Blood Culture adequate volume   Culture  Setup Time   Final    GRAM POSITIVE COCCI IN PAIRS AND CHAINS ANAEROBIC BOTTLE ONLY CRITICAL RESULT CALLED TO, READ BACK BY AND VERIFIED WITH: A. Love PharmD 14:20 09/24/18 ([email or web address]) Performed at [facility], [place] [address] [place], [place] [address]    Culture STREPTOCOCCUS ANGINOSIS (A)  Final   Report Status 09/26/2018 FINAL  Final   Organism ID, Bacteria STREPTOCOCCUS ANGINOSIS  Final      Susceptibility   Streptococcus anginosis - MIC*    PENICILLIN <=0.06 SENSITIVE Sensitive     CEFTRIAXONE 0.5 SENSITIVE Sensitive     ERYTHROMYCIN <=0.12 SENSITIVE Sensitive     LEVOFLOXACIN 0.5 SENSITIVE Sensitive     VANCOMYCIN 1 SENSITIVE Sensitive     * STREPTOCOCCUS ANGINOSIS  Blood Culture (routine x 2)     Status: None (Preliminary result)   Collection Time: 09/23/18  6:54 PM   Specimen: Site Not Specified; Blood  Result Value Ref Range Status   Specimen Description SITE NOT SPECIFIED  Final   Special Requests   Final    BOTTLES DRAWN AEROBIC ONLY Blood Culture adequate volume   Culture   Final    NO GROWTH 3 DAYS Performed at [facility], [address], [place], [place] [address]    Report Status PENDING  Incomplete  Blood Culture ID Panel (Reflexed)     Status: Abnormal   Collection Time: 09/23/18  6:54 PM  Result Value Ref Range Status   Enterococcus species NOT DETECTED NOT DETECTED Final   Listeria monocytogenes NOT DETECTED NOT DETECTED Final   Staphylococcus species NOT DETECTED NOT DETECTED Final   Staphylococcus aureus (BCID) NOT DETECTED NOT DETECTED Final   Streptococcus species DETECTED (A) NOT DETECTED Final    Comment: Not Enterococcus species, Streptococcus agalactiae,  Streptococcus pyogenes, or Streptococcus pneumoniae. CRITICAL RESULT CALLED TO, READ BACK BY AND  VERIFIED WITH: A. Love PharmD 14:20 09/24/18 ([email or web address])    Streptococcus agalactiae NOT DETECTED NOT DETECTED Final   Streptococcus pneumoniae NOT DETECTED NOT DETECTED Final   Streptococcus pyogenes NOT DETECTED NOT DETECTED Final   Acinetobacter baumannii NOT DETECTED NOT DETECTED Final   Enterobacteriaceae species NOT DETECTED NOT DETECTED Final   Enterobacter cloacae complex NOT DETECTED NOT DETECTED Final   Escherichia coli NOT DETECTED NOT DETECTED Final   Klebsiella oxytoca NOT DETECTED NOT DETECTED Final   Klebsiella pneumoniae NOT DETECTED NOT DETECTED Final   Proteus species NOT DETECTED NOT DETECTED Final   Serratia marcescens NOT DETECTED NOT DETECTED Final   Haemophilus influenzae NOT DETECTED NOT DETECTED Final   Neisseria meningitidis NOT DETECTED NOT DETECTED Final   Pseudomonas aeruginosa NOT DETECTED NOT DETECTED Final   Candida albicans NOT DETECTED NOT DETECTED Final   Candida glabrata NOT DETECTED NOT DETECTED Final   Candida krusei NOT DETECTED NOT DETECTED Final   Candida parapsilosis NOT DETECTED NOT DETECTED Final   Candida tropicalis NOT DETECTED NOT DETECTED Final    Comment: Performed at [facility], [place] [address] [place], [place] [address]  SARS Coronavirus 2 (CEPHEID - Performed in [facility] lab), Hosp Order     Status: None   Collection Time: 09/23/18  7:04 PM   Specimen: Nasopharyngeal Swab  Result Value Ref Range Status   SARS Coronavirus 2 NEGATIVE NEGATIVE Final    Comment: (NOTE) If result is NEGATIVE SARS-CoV-2 target nucleic acids are NOT DETECTED. The SARS-CoV-2 RNA is generally detectable in upper and lower  respiratory specimens during the acute phase of infection. The lowest  concentration of SARS-CoV-2 viral copies this assay can detect is 250  copies / mL. A negative result does not preclude SARS-CoV-2 infection  and should not be used as the sole basis for treatment or other  patient management decisions.  A negative  result may occur with  improper specimen collection / handling, submission of specimen other  than nasopharyngeal swab, presence of viral mutation(s) within the  areas targeted by this assay, and inadequate number of viral copies  (<250 copies / mL). A negative result must be combined with clinical  observations, patient history, and epidemiological information. If result is POSITIVE SARS-CoV-2 target nucleic acids are DETECTED. The SARS-CoV-2 RNA is generally detectable in upper and lower  respiratory specimens dur ing the acute phase of infection.  Positive  results are indicative of active infection with SARS-CoV-2.  Clinical  correlation with patient history and other diagnostic information is  necessary to determine patient infection status.  Positive results do  not rule out bacterial infection or co-infection with other viruses. If result is PRESUMPTIVE POSTIVE SARS-CoV-2 nucleic acids MAY BE PRESENT.   A presumptive positive result was obtained on the submitted specimen  and confirmed on repeat testing.  While 2019 novel coronavirus  (SARS-CoV-2) nucleic acids may be present in the submitted sample  additional confirmatory testing may be necessary for epidemiological  and / or clinical management purposes  to differentiate between  SARS-CoV-2 and other Sarbecovirus currently known to infect humans.  If clinically indicated additional testing with an alternate test  methodology [phone]) is advised. The SARS-CoV-2 RNA is generally  detectable in upper and lower respiratory sp ecimens during the acute  phase of infection. The expected result is Negative. Fact Sheet for Patients:  StrictlyIdeas.no Fact Sheet for Healthcare Providers:  BankingDealers.co.za This test is not yet approved or cleared by the [place] and has been authorized for detection and/or diagnosis of SARS-CoV-2 by FDA under an Emergency Use Authorization  (EUA).  This EUA will remain in effect (meaning this test can be used) for the duration of the COVID-19 declaration under Section 564(b)(1) of the Act, 21 U.S.C. section 360bbb-3(b)(1), unless the authorization is terminated or revoked sooner. Performed at [facility], [place] [address] [place], [place] [address]   Urine culture     Status: None   Collection Time: 09/23/18  8:35 PM   Specimen: Urine, Catheterized  Result Value Ref Range Status   Specimen Description URINE, CATHETERIZED  Final   Special Requests NONE  Final   Culture   Final    NO GROWTH Performed at [facility], [place] [address], [place], [place] [address]    Report Status 09/24/2018 FINAL  Final  C difficile quick scan w PCR reflex     Status: Abnormal   Collection Time: 09/23/18  8:35 PM   Specimen: Urine, Catheterized; Stool  Result Value Ref Range Status   C Diff antigen POSITIVE (A) NEGATIVE Final   C Diff toxin POSITIVE (A) NEGATIVE Final   C Diff interpretation Toxin producing C. difficile detected.  Final    Comment: CRITICAL RESULT CALLED TO, READ BACK BY AND VERIFIED WITH: A DENNIS RN 09/24/18 0015 JDW Performed at [facility], [address], [place], [place] [address]   Gastrointestinal Panel by PCR , Stool     Status: None   Collection Time: 09/23/18  8:35 PM   Specimen: Stool  Result Value Ref Range Status   Campylobacter species NOT DETECTED NOT DETECTED Final   Plesimonas shigelloides NOT DETECTED NOT DETECTED Final   Salmonella species NOT DETECTED NOT DETECTED Final   Yersinia enterocolitica NOT DETECTED NOT DETECTED Final   Vibrio species NOT DETECTED NOT DETECTED Final   Vibrio cholerae NOT DETECTED NOT DETECTED Final   Enteroaggregative E coli (EAEC) NOT DETECTED NOT DETECTED Final   Enteropathogenic E coli (EPEC) NOT DETECTED NOT DETECTED Final   Enterotoxigenic E coli (ETEC) NOT DETECTED NOT DETECTED Final   Shiga like toxin producing E coli (STEC) NOT DETECTED NOT  DETECTED Final   Shigella/Enteroinvasive E coli (EIEC) NOT DETECTED NOT DETECTED Final   Cryptosporidium NOT DETECTED NOT DETECTED Final   Cyclospora cayetanensis NOT DETECTED NOT DETECTED Final   Entamoeba histolytica NOT DETECTED NOT DETECTED Final   Giardia lamblia NOT DETECTED NOT DETECTED Final   Adenovirus F40/41 NOT DETECTED NOT DETECTED Final   Astrovirus NOT DETECTED NOT DETECTED Final   Norovirus GI/GII NOT DETECTED NOT DETECTED Final   Rotavirus A NOT DETECTED NOT DETECTED Final   Sapovirus (I, II, IV, and V) NOT DETECTED NOT DETECTED Final    Comment: Performed at [facility], [address] [place], [place] [address]  CSF culture     Status: None (Preliminary result)   Collection Time: 09/23/18 10:12 PM   Specimen: CSF; Cerebrospinal Fluid  Result Value Ref Range Status   Specimen Description CSF  Final   Special Requests NONE  Final   Gram Stain NO WBC SEEN NO ORGANISMS SEEN CYTOSPIN SMEAR   Final   Culture   Final    NO GROWTH 3 DAYS Performed at [facility], [address], [place], [place] [address]    Report Status PENDING  Incomplete     Marya Amsler  Kenneth [name], [place] for [place] Pager  09/26/2018  2:44 PM

## 2018-09-27 ENCOUNTER — Ambulatory Visit: Payer: BC Managed Care – PPO | Admitting: Family Medicine

## 2018-09-27 ENCOUNTER — Encounter (HOSPITAL_COMMUNITY): Payer: Self-pay | Admitting: Gastroenterology

## 2018-09-27 DIAGNOSIS — K567 Ileus, unspecified: Secondary | ICD-10-CM

## 2018-09-27 DIAGNOSIS — D72819 Decreased white blood cell count, unspecified: Secondary | ICD-10-CM | POA: Diagnosis present

## 2018-09-27 DIAGNOSIS — E876 Hypokalemia: Secondary | ICD-10-CM

## 2018-09-27 DIAGNOSIS — A419 Sepsis, unspecified organism: Secondary | ICD-10-CM

## 2018-09-27 DIAGNOSIS — R6520 Severe sepsis without septic shock: Secondary | ICD-10-CM

## 2018-09-27 DIAGNOSIS — B20 Human immunodeficiency virus [HIV] disease: Secondary | ICD-10-CM

## 2018-09-27 LAB — VITAMIN D 25 HYDROXY (VIT D DEFICIENCY, FRACTURES): Vit D, 25-Hydroxy: 16.4 ng/mL — ABNORMAL LOW (ref 30.0–100.0)

## 2018-09-27 LAB — TACROLIMUS LEVEL: Tacrolimus (FK506) - LabCorp: 41.9 ng/mL — ABNORMAL HIGH (ref 2.0–20.0)

## 2018-09-27 LAB — BASIC METABOLIC PANEL
Anion gap: 14 (ref 5–15)
BUN: 92 mg/dL — ABNORMAL HIGH (ref 8–23)
CO2: 20 mmol/L — ABNORMAL LOW (ref 22–32)
Calcium: 6.7 mg/dL — ABNORMAL LOW (ref 8.9–10.3)
Chloride: 98 mmol/L (ref 98–111)
Creatinine, Ser: 3.75 mg/dL — ABNORMAL HIGH (ref 0.61–1.24)
GFR calc Af Amer: 19 mL/min — ABNORMAL LOW (ref >60)
GFR calc non Af Amer: 16 mL/min — ABNORMAL LOW (ref >60)
Glucose, Bld: 147 mg/dL — ABNORMAL HIGH (ref 70–99)
Potassium: 4.1 mmol/L (ref 3.5–5.1)
Sodium: 132 mmol/L — ABNORMAL LOW (ref 135–145)

## 2018-09-27 LAB — CBC WITH DIFFERENTIAL/PLATELET
Abs Immature Granulocytes: 0 10*3/uL (ref 0.00–0.07)
Basophils Absolute: 0 10*3/uL (ref 0.0–0.1)
Basophils Relative: 0 %
Eosinophils Absolute: 0 10*3/uL (ref 0.0–0.5)
Eosinophils Relative: 0 %
HCT: 34.9 % — ABNORMAL LOW (ref 39.0–52.0)
Hemoglobin: 12.4 g/dL — ABNORMAL LOW (ref 13.0–17.0)
Lymphocytes Relative: 0 %
Lymphs Abs: 0 10*3/uL — ABNORMAL LOW (ref 0.7–4.0)
MCH: 30.7 pg (ref 26.0–34.0)
MCHC: 35.5 g/dL (ref 30.0–36.0)
MCV: 86.4 fL (ref 80.0–100.0)
Monocytes Absolute: 0.4 10*3/uL (ref 0.1–1.0)
Monocytes Relative: 30 %
Neutro Abs: 0.9 10*3/uL — ABNORMAL LOW (ref 1.7–7.7)
Neutrophils Relative %: 70 %
Platelets: 156 10*3/uL (ref 150–400)
RBC: 4.04 MIL/uL — ABNORMAL LOW (ref 4.22–5.81)
RDW: 13.5 % (ref 11.5–15.5)
WBC: 1.3 10*3/uL — CL (ref 4.0–10.5)
nRBC: 0 % (ref 0.0–0.2)
nRBC: 4 /100 WBC — ABNORMAL HIGH

## 2018-09-27 LAB — RENAL FUNCTION PANEL
Albumin: 1.5 g/dL — ABNORMAL LOW (ref 3.5–5.0)
Anion gap: 11 (ref 5–15)
BUN: 82 mg/dL — ABNORMAL HIGH (ref 8–23)
CO2: 35 mmol/L — ABNORMAL HIGH (ref 22–32)
Calcium: 5.6 mg/dL — CL (ref 8.9–10.3)
Chloride: 88 mmol/L — ABNORMAL LOW (ref 98–111)
Creatinine, Ser: 3.13 mg/dL — ABNORMAL HIGH (ref 0.61–1.24)
GFR calc Af Amer: 24 mL/min — ABNORMAL LOW (ref >60)
GFR calc non Af Amer: 20 mL/min — ABNORMAL LOW (ref >60)
Glucose, Bld: 426 mg/dL — ABNORMAL HIGH (ref 70–99)
Phosphorus: 1.7 mg/dL — ABNORMAL LOW (ref 2.5–4.6)
Potassium: 3.4 mmol/L — ABNORMAL LOW (ref 3.5–5.1)
Sodium: 134 mmol/L — ABNORMAL LOW (ref 135–145)

## 2018-09-27 LAB — CBC
HCT: 31.7 % — ABNORMAL LOW (ref 39.0–52.0)
Hemoglobin: 11.6 g/dL — ABNORMAL LOW (ref 13.0–17.0)
MCH: 31.4 pg (ref 26.0–34.0)
MCHC: 36.6 g/dL — ABNORMAL HIGH (ref 30.0–36.0)
MCV: 85.9 fL (ref 80.0–100.0)
Platelets: 103 10*3/uL — ABNORMAL LOW (ref 150–400)
RBC: 3.69 MIL/uL — ABNORMAL LOW (ref 4.22–5.81)
RDW: 13.5 % (ref 11.5–15.5)
WBC: 1.3 10*3/uL — CL (ref 4.0–10.5)
nRBC: 0 % (ref 0.0–0.2)

## 2018-09-27 LAB — CSF CULTURE W GRAM STAIN
Culture: NO GROWTH
Gram Stain: NONE SEEN

## 2018-09-27 LAB — LACTIC ACID, PLASMA
Lactic Acid, Venous: 2.6 mmol/L (ref 0.5–1.9)
Lactic Acid, Venous: 1.5 mmol/L (ref 0.5–1.9)

## 2018-09-27 LAB — MAGNESIUM: Magnesium: 2.2 mg/dL (ref 1.7–2.4)

## 2018-09-27 MED ORDER — CALCIUM GLUCONATE-NACL 1-0.675 GM/50ML-% IV SOLN
1.0000 g | Freq: Once | INTRAVENOUS | Status: AC
  Administered 2018-09-27: 1000 mg via INTRAVENOUS
  Filled 2018-09-27: qty 50

## 2018-09-27 MED ORDER — SODIUM CHLORIDE 0.9 % IV SOLN
INTRAVENOUS | Status: DC
  Administered 2018-09-27 – 2018-09-28 (×3): via INTRAVENOUS

## 2018-09-27 MED ORDER — SODIUM PHOSPHATES 45 MMOLE/15ML IV SOLN
20.0000 mmol | Freq: Once | INTRAVENOUS | Status: AC
  Administered 2018-09-27: 20 mmol via INTRAVENOUS
  Filled 2018-09-27: qty 6.67

## 2018-09-27 MED ORDER — VANCOMYCIN HCL 500 MG IV SOLR
500.0000 mg | Freq: Four times a day (QID) | Status: DC
  Administered 2018-09-27 – 2018-09-29 (×6): 500 mg via RECTAL
  Filled 2018-09-27 (×12): qty 500

## 2018-09-27 MED ORDER — HYDROMORPHONE HCL 1 MG/ML IJ SOLN
0.5000 mg | Freq: Four times a day (QID) | INTRAMUSCULAR | Status: DC | PRN
  Administered 2018-09-27 – 2018-09-29 (×6): 0.5 mg via INTRAVENOUS
  Filled 2018-09-27 (×6): qty 0.5

## 2018-09-27 MED ORDER — POTASSIUM CHLORIDE 10 MEQ/100ML IV SOLN
10.0000 meq | INTRAVENOUS | Status: AC
  Administered 2018-09-27 (×4): 10 meq via INTRAVENOUS
  Filled 2018-09-27 (×3): qty 100

## 2018-09-27 NOTE — Progress Notes (Signed)
Physical Therapy Treatment Patient Details Name: Kenneth [name] MRN: [id number] DOB: May 01, 1955 Today's Date: 09/27/2018    History of Present Illness 63 yo male admitted from [facility] on 09/23/2018 with sepsis. He was intially admitted on 5/22 after falling multiple times at home. Pt sustained bil sacral ala fractures, S2 compression fx, and L 1st MCP dislocation s/p reduction. Hx of HIV, DVT, chronic fatigue, lupus, multiple fractures from repeated falls.     PT Comments    Pt awake but with limited verbalization throughout session. PT able to state name and agreement to try to get OOB but once EOB but appeared in pain but unable to localize and unable to tolerate further mobility. Pt with HR 115-122, 100% on 3L . Pt with decreased function and mobility this session with noted potential transfer to [facility]. Will continue to follow as pt able to tolerate.     Follow Up Recommendations  SNF;Supervision/Assistance - 24 hour     Equipment Recommendations  None recommended by PT    Recommendations for Other Services       Precautions / Restrictions Precautions Precautions: Fall    Mobility  Bed Mobility Overal bed mobility: Needs Assistance Bed Mobility: Supine to Sit;Sit to Supine;Rolling Rolling: Mod assist   Supine to sit: Mod assist Sit to supine: Mod assist   General bed mobility comments: mod assist to roll bil with assist to straighten pad. Pt with physical assist to pivot to EOB and minguard assist for sitting balance. Pt able to sit 3 min with 2 partial scoots to [facility] then requested return to supine unable to tolerate further activity  Transfers                 General transfer comment: pt denied attempting  Ambulation/Gait                 Stairs             Wheelchair Mobility    Modified Rankin (Stroke Patients Only)       Balance Overall balance assessment: Needs assistance Sitting-balance support: Bilateral upper extremity  supported;Feet supported Sitting balance-Leahy Scale: Poor Sitting balance - Comments: minguard EOB grossly 3 min                                    Cognition Arousal/Alertness: Awake/alert Behavior During Therapy: WFL for tasks assessed/performed Overall Cognitive Status: No family/caregiver present to determine baseline cognitive functioning                                 General Comments: pt alert and able to state name and would initially agree to mobility but then stated pain and need to lie down. Pt not very verbal and appears uncomfortable but unable to state further      Exercises      General Comments        Pertinent Vitals/Pain Pain Assessment: Faces Pain Descriptors / Indicators: Grimacing Pain Intervention(s): Limited activity within patient's tolerance;Repositioned;Monitored during session    Home Living                      Prior Function            PT Goals (current goals can now be found in the care plan section) Progress towards PT goals: Progressing toward goals  Frequency    Min 2X/week      PT Plan Current plan remains appropriate    Co-evaluation              AM-PAC PT "6 Clicks" Mobility   Outcome Measure  Help needed turning from your back to your side while in a flat bed without using bedrails?: A Lot Help needed moving from lying on your back to sitting on the side of a flat bed without using bedrails?: A Lot Help needed moving to and from a bed to a chair (including a wheelchair)?: Total Help needed standing up from a chair using your arms (e.g., wheelchair or bedside chair)?: Total Help needed to walk in hospital room?: Total Help needed climbing 3-5 steps with a railing? : Total 6 Click Score: 8    End of Session   Activity Tolerance: Patient limited by pain;Patient limited by fatigue Patient left: in bed;with call bell/phone within reach;with bed alarm set Nurse Communication:  Mobility status PT Visit Diagnosis: Unsteadiness on feet (R26.81);Other abnormalities of gait and mobility (R26.89);Muscle weakness (generalized) (M62.81)     Time: 9692-4932 PT Time Calculation (min) (ACUTE ONLY): 17 min  Charges:  $Therapeutic Activity: 8-22 mins                     Paul, PT Acute Rehabilitation Services Pager: [phone] Office: [place] 09/27/2018, 1:04 PM

## 2018-09-27 NOTE — Progress Notes (Signed)
Noted that lab glucose readings have been [phone] mg/dl over the last 5 days.  Recommend checking blood sugars(CBGs) TID & HS while in the hospital.   Harvel Ricks RN BSN CDE Diabetes Coordinator Pager: [phone]  8am-5pm

## 2018-09-27 NOTE — Progress Notes (Signed)
PROGRESS NOTE                                                                                                                                                                                                             Patient Demographics:    Kenn[facility] [name], is a 63 y.o. 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 intake and 4-day history of diarrhea.  Patient was severely septic with fever, hypotension and acute kidney injury on presentation.  He was tested positive for C. difficile.  He even required vasopressors but quickly improved with 2 L IV fluid bolus.  CT of the abdomen pelvis showed pancolitis and several new fractures involving greater trochanter of the right femur, sacral fracture and L5 fracture (patient has had sacral fracture when he was hospitalized in May and orthopedics recommended weightbearing and nonsurgical intervention). Patient admitted to stepdown for severe sepsis and ATN secondary to C. difficile pancolitis.   Subjective:     Assessment  & Plan :    Principal Problem:   Severe sepsis ([name]) Secondary to C. difficile pancolitis.  Not sure if he was treated with antibiotic recently. On oral vancomycin and IV Flagyl.  Still tachycardic in the low 100s.  Neutropenia likely secondary to sepsis.  Now developed tense tender abdomen.  Repeat CT done on 6/25 (with oral contrast only) again shows  severe pancolitis, unchanged from admission with new small volume free fluid in the abdomen and bibasilar atelectasis.  No megacolon or abscess noted.  Concern for ileus.  Check lactic acid. Placed on low-dose IV Dilaudid for pain.  Made n.p.o.  [name] consulted for evaluation.  Strep bacteremia Blood culture 1/3 growing strep anginosis.  [name] placed him on IV penicillin.  Repeat blood cultures without growth.  Active Problems:  Acute tubular necrosis ([name]) Suspect prerenal with severe sepsis/C. difficile colitis.  Normal saline switch to IV bicarb drip by nephrology.  Creatinine still 3.1.  Avoid nephrotoxins.  Do not see urine output charted.  Monitor for urinary retention.  Strict I's/O and bladder scan.  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 On  Prograf.  Holding Plaquenil and CellCept per nephrology.  Bactrim held ( resume once renal function further improved).  [name] spoke with transplant nephrology at [name] who recommended holding CellCept until diarrhea improves.  Tacrolimus level (trough) was high (41.9).   Right transverse process L5 fracture along with sacral fractures MRI done shows acute to subacute bilateral sacral alar fracture stable from prior CT.  New acute or subacute nondisplaced fracture of the bilateral transfer process of L5, mild diffuse soft tissue edema of the lower lumbar and sacral spine.  Central disc protrusion at L4-L5 with mild bilateral lateral recess stenosis. Orthopedics consult recommends nonoperative management with weightbearing as tolerated.  Check vitamin D level and placed on supplement if low.  Follow-up in office in 2 weeks to undergo full bone metabolism work-up.    HIV AIDS. CD4 of 45.  Likely due to sepsis.  Was 266 months ago.  Says he is now following at [name] and previously saw [name].  Patient states he has been adherent to his ART.  Resume  Bactrim.  Acute metabolic encephalopathy ([name]) Suspect secondary to severe sepsis.  Underwent LP which was unremarkable.  Mental status currently at baseline.    Leukopenia WBC of 1.3 with ANC of 910.  Possibly due to sepsis.  Monitor closely.  Hypokalemia/hypocalcemia Replenished.  Corrected calcium of 7.6.  Ordered IV calcium gluconate.  Check magnesium.    Current chronic use of systemic steroids Was placed on IV hydrocortisone for sepsis, resume back to prednisone.  Oral ulcers Continue Magic mouthwash.  Placed on acyclovir.  History of mid and lower esophageal ulceration (as per endoscopy done at [name] done on 6/17). On nystatin.  Surgical pathology not available on care everywhere.  Severe protein calorie malnutrition ([place]) Physical deconditioning PT recommends SNF.  Nutrition consulted.   Disposition: Given no improvement of his renal function despite fluid resuscitation and being a transplant recipient, transplant surgeon at [name] has been consulted by [name] and he has been accepted there.   Code Status : Full code, condition guarded  Family Communication  : None at bedside  Disposition Plan  : Awaiting transfer to [name].  Barriers For Discharge : Active symptoms  Consults  : Nephrology  Procedures  : CT abdomen, MRI lumbar spine  DVT Prophylaxis  : Subcutaneous Heparin  Lab Results  Component Value Date   PLT 103 (L) 09/27/2018    Antibiotics  :    Anti-infectives (From admission, onward)   Start     Dose/Rate Route Frequency Ordered Stop   09/27/18 0900  sulfamethoxazole-trimethoprim (BACTRIM DS) 800-160 MG per tablet 1 tablet     1 tablet Oral 3 times weekly 09/26/18 1524     09/26/18 2000  penicillin G potassium 4 Million Units in dextrose 5 % 250 mL IVPB     4 Million Units 250 mL/hr over 60 Minutes Intravenous Every 8 hours 09/26/18 1541     09/26/18 1400  cefTRIAXone (ROCEPHIN) 2 g in sodium chloride 0.9 % 100 mL IVPB  Status:   Discontinued     2 g 200 mL/hr over  30 Minutes Intravenous Every 24 hours 09/26/18 1225 09/26/18 1541   09/25/18 1100  ceFEPIme (MAXIPIME) 2 g in sodium chloride 0.9 % 100 mL IVPB  Status:  Discontinued     2 g 200 mL/hr over 30 Minutes Intravenous Every 24 hours 09/25/18 1007 09/26/18 1225   09/25/18 1030  acyclovir (ZOVIRAX) tablet 200 mg     200 mg Oral 3 times daily 09/25/18 1007     09/25/18 1000  sulfamethoxazole-trimethoprim (BACTRIM DS) 800-160 MG per tablet 1 tablet  Status:  Discontinued     1 tablet Oral Every M-W-F 09/24/18 0045 09/25/18 0930   09/24/18 1900  ceFEPIme (MAXIPIME) 1 g in sodium chloride 0.9 % 100 mL IVPB  Status:  Discontinued     1 g 200 mL/hr over 30 Minutes Intravenous Every 24 hours 09/23/18 1946 09/24/18 0115   09/24/18 1000  hydroxychloroquine (PLAQUENIL) tablet 200 mg     200 mg Oral 2 times daily 09/24/18 0045     09/24/18 1000  abacavir-dolutegravir-lamiVUDine (TRIUMEQ) 600-50-300 MG per tablet 1 tablet     1 tablet Oral Daily 09/24/18 0045     09/24/18 0600  vancomycin (VANCOCIN) 50 mg/mL oral solution 500 mg     500 mg Oral 3 times daily before meals & bedtime 09/24/18 0055 10/08/18 0759   09/24/18 0200  metroNIDAZOLE (FLAGYL) IVPB 500 mg     500 mg 100 mL/hr over 60 Minutes Intravenous Every 8 hours 09/24/18 0055 10/08/18 0159   09/23/18 1900  ceFEPIme (MAXIPIME) 2 g in sodium chloride 0.9 % 100 mL IVPB     2 g 200 mL/hr over 30 Minutes Intravenous  Once 09/23/18 1856 09/23/18 2011   09/23/18 1900  vancomycin (VANCOCIN) IVPB 1000 mg/200 mL premix     1,000 mg 200 mL/hr over 60 Minutes Intravenous  Once 09/23/18 1856 09/23/18 2144        Objective:   Vitals:   09/26/18 1159 09/26/18 2050 09/27/18 0541 09/27/18 0555  BP: 126/73 121/76 (!) 121/97   Pulse: (!) 111 (!) 110 67   Resp: 16 17 18    Temp: 99.2 F (37.3 C) 97.6 F (36.4 C) 97.8 F (36.6 C)   TempSrc: Axillary Oral Oral   SpO2: 97% 100% (!) 71% 100%  Weight:    57.6 kg  Height:         Wt Readings from Last 3 Encounters:  09/27/18 57.6 kg  09/20/18 53.4 kg  09/19/18 53.4 kg     Intake/Output Summary (Last 24 hours) at 09/27/2018 1102 Last data filed at 09/27/2018 0954 Gross per 24 hour  Intake 3740 ml  Output 150 ml  Net 3590 ml   Physical exam In some distress with pain HEENT: Dry mucosa, supple neck, ulcer over the left lower lip has improved Chest: Clear bilaterally CVs: S1-S2 tachycardic, no murmurs [name]: Soft, nondistended but tense tender abdomen, bowel sounds present Musculoskeletal: Warm, no edema CNS: Alert and oriented     Data Review:    CBC Recent Labs  Lab 09/23/18 1854 09/24/18 0408 09/25/18 0726 09/26/18 0229 09/26/18 0751 09/27/18 0931  WBC 2.6* 2.5* 2.9* 1.6* 1.3* 1.3*  HGB 15.2 13.7 12.9* 11.6* 12.4* 11.6*  HCT 44.8 40.2 36.8* 32.5* 34.9* 31.7*  PLT 273 203 199 175 156 103*  MCV 92.4 91.6 88.7 86.4 86.4 85.9  MCH 31.3 31.2 31.1 30.9 30.7 31.4  MCHC 33.9 34.1 35.1 35.7 35.5 36.6*  RDW 13.6 13.6 13.7 13.6 13.5 13.5  LYMPHSABS 0.8  --   --   --  0.0*  --   MONOABS 0.3  --   --   --  0.4  --   EOSABS 0.0  --   --   --  0.0  --   BASOSABS 0.0  --   --   --  0.0  --     Chemistries  Recent Labs  Lab 09/23/18 1854 09/24/18 0408 09/25/18 0726 09/26/18 0229 09/27/18 0931  NA 133* 131* 133* 133* 134*  K 4.5 4.6 4.3 3.7 3.4*  CL 101 103 109 106 88*  CO2 16* 14* 13* 17* 35*  GLUCOSE 141* 104* 109* 213* 426*  BUN 74* 77* 91* 94* 82*  CREATININE 3.68* 3.51* 2.99* 3.11* 3.13*  CALCIUM 8.4* 7.6* 6.7* 6.5* 5.6*  MG 2.7*  --   --   --   --   AST 25  --   --   --   --   ALT 13  --   --   --   --   ALKPHOS 90  --   --   --   --   BILITOT 0.6  --   --   --   --    ------------------------------------------------------------------------------------------------------------------ No results for input(s): CHOL, HDL, LDLCALC, TRIG, CHOLHDL, LDLDIRECT in the last 72 hours.  No results found for:  HGBA1C ------------------------------------------------------------------------------------------------------------------ No results for input(s): TSH, T4TOTAL, T3FREE, THYROIDAB in the last 72 hours.  Invalid input(s): FREET3 ------------------------------------------------------------------------------------------------------------------ No results for input(s): VITAMINB12, FOLATE, FERRITIN, TIBC, IRON, RETICCTPCT in the last 72 hours.  Coagulation profile No results for input(s): INR, PROTIME in the last 168 hours.  No results for input(s): DDIMER in the last 72 hours.  Cardiac Enzymes No results for input(s): CKMB, TROPONINI, MYOGLOBIN in the last 168 hours.  Invalid input(s): CK ------------------------------------------------------------------------------------------------------------------    Component Value Date/Time   BNP 176.0 (H) 09/23/2018 1824    Inpatient Medications  Scheduled Meds:  abacavir-dolutegravir-lamiVUDine  1 tablet Oral Daily   acyclovir  200 mg Oral TID   aspirin  81 mg Oral Daily   cholecalciferol  2,000 Units Oral Daily   feeding supplement (ENSURE ENLIVE)  237 mL Oral TID BM   heparin  5,000 Units Subcutaneous Q8H   hydroxychloroquine  200 mg Oral BID   magic mouthwash  5 mL Oral TID   multivitamin with minerals  1 tablet Oral Daily   nystatin  5 mL Oral QID   predniSONE  10 mg Oral Daily   sulfamethoxazole-trimethoprim  1 tablet Oral 3 times weekly   tamsulosin  0.4 mg Oral QHS   vancomycin  500 mg Oral TID AC & HS   Continuous Infusions:  sodium chloride 1,000 mL (09/26/18 0107)   calcium gluconate     metronidazole 500 mg (09/27/18 0924)   pencillin G potassium IV 4 Million Units (09/27/18 0656)   potassium chloride      sodium bicarbonate  infusion 1000 mL 100 mL/hr at 09/27/18 0132   PRN Meds:.sodium chloride, acetaminophen **OR** acetaminophen, alum & mag hydroxide-simeth, HYDROmorphone (DILAUDID) injection,  ondansetron **OR** ondansetron (ZOFRAN) IV  Micro Results Recent Results (from the past 240 hour(s))  Blood Culture (routine x 2)     Status: Abnormal   Collection Time: 09/23/18  6:54 PM   Specimen: BLOOD  Result Value Ref Range Status   Specimen Description BLOOD SITE NOT SPECIFIED  Final   Special Requests   Final    BOTTLES DRAWN AEROBIC AND ANAEROBIC Blood Culture adequate volume   Culture  Setup  Time   Final    GRAM POSITIVE COCCI IN PAIRS AND CHAINS ANAEROBIC BOTTLE ONLY CRITICAL RESULT CALLED TO, READ BACK BY AND VERIFIED WITH: A. Love PharmD 14:20 09/24/18 ([email or web address]) Performed at [name], [place] [address], [place], [place] [address]    Culture STREPTOCOCCUS ANGINOSIS (A)  Final   Report Status 09/26/2018 FINAL  Final   Organism ID, Bacteria STREPTOCOCCUS ANGINOSIS  Final      Susceptibility   Streptococcus anginosis - MIC*    PENICILLIN <=0.06 SENSITIVE Sensitive     CEFTRIAXONE 0.5 SENSITIVE Sensitive     ERYTHROMYCIN <=0.12 SENSITIVE Sensitive     LEVOFLOXACIN 0.5 SENSITIVE Sensitive     VANCOMYCIN 1 SENSITIVE Sensitive     * STREPTOCOCCUS ANGINOSIS  Blood Culture (routine x 2)     Status: None (Preliminary result)   Collection Time: 09/23/18  6:54 PM   Specimen: Site Not Specified; Blood  Result Value Ref Range Status   Specimen Description SITE NOT SPECIFIED  Final   Special Requests   Final    BOTTLES DRAWN AEROBIC ONLY Blood Culture adequate volume   Culture   Final    NO GROWTH 4 DAYS Performed at [name], [address], [place], [place] [address]    Report Status PENDING  Incomplete  Blood Culture ID Panel (Reflexed)     Status: Abnormal   Collection Time: 09/23/18  6:54 PM  Result Value Ref Range Status   Enterococcus species NOT DETECTED NOT DETECTED Final   Listeria monocytogenes NOT DETECTED NOT DETECTED Final   Staphylococcus species NOT DETECTED NOT DETECTED Final   Staphylococcus aureus (BCID) NOT DETECTED NOT DETECTED Final    Streptococcus species DETECTED (A) NOT DETECTED Final    Comment: Not Enterococcus species, Streptococcus agalactiae, Streptococcus pyogenes, or Streptococcus pneumoniae. CRITICAL RESULT CALLED TO, READ BACK BY AND VERIFIED WITH: A. Love PharmD 14:20 09/24/18 ([email or web address])    Streptococcus agalactiae NOT DETECTED NOT DETECTED Final   Streptococcus pneumoniae NOT DETECTED NOT DETECTED Final   Streptococcus pyogenes NOT DETECTED NOT DETECTED Final   Acinetobacter baumannii NOT DETECTED NOT DETECTED Final   Enterobacteriaceae species NOT DETECTED NOT DETECTED Final   Enterobacter cloacae complex NOT DETECTED NOT DETECTED Final   Escherichia coli NOT DETECTED NOT DETECTED Final   Klebsiella oxytoca NOT DETECTED NOT DETECTED Final   Klebsiella pneumoniae NOT DETECTED NOT DETECTED Final   Proteus species NOT DETECTED NOT DETECTED Final   Serratia marcescens NOT DETECTED NOT DETECTED Final   Haemophilus influenzae NOT DETECTED NOT DETECTED Final   Neisseria meningitidis NOT DETECTED NOT DETECTED Final   Pseudomonas aeruginosa NOT DETECTED NOT DETECTED Final   Candida albicans NOT DETECTED NOT DETECTED Final   Candida glabrata NOT DETECTED NOT DETECTED Final   Candida krusei NOT DETECTED NOT DETECTED Final   Candida parapsilosis NOT DETECTED NOT DETECTED Final   Candida tropicalis NOT DETECTED NOT DETECTED Final    Comment: Performed at [name], [place] [address] [place], [place] [address]  SARS Coronavirus 2 (CEPHEID - Performed in [name]), Hosp Order     Status: None   Collection Time: 09/23/18  7:04 PM   Specimen: Nasopharyngeal Swab  Result Value Ref Range Status   SARS Coronavirus 2 NEGATIVE NEGATIVE Final    Comment: (NOTE) If result is NEGATIVE SARS-CoV-2 target nucleic acids are NOT DETECTED. The SARS-CoV-2 RNA is generally detectable in upper and lower  respiratory specimens during the acute phase of  infection. The lowest  concentration of SARS-CoV-2 viral  copies this assay can detect is 250  copies / mL. A negative result does not preclude SARS-CoV-2 infection  and should not be used as the sole basis for treatment or other  patient management decisions.  A negative result may occur with  improper specimen collection / handling, submission of specimen other  than nasopharyngeal swab, presence of viral mutation(s) within the  areas targeted by this assay, and inadequate number of viral copies  (<250 copies / mL). A negative result must be combined with clinical  observations, patient history, and epidemiological information. If result is POSITIVE SARS-CoV-2 target nucleic acids are DETECTED. The SARS-CoV-2 RNA is generally detectable in upper and lower  respiratory specimens dur ing the acute phase of infection.  Positive  results are indicative of active infection with SARS-CoV-2.  Clinical  correlation with patient history and other diagnostic information is  necessary to determine patient infection status.  Positive results do  not rule out bacterial infection or co-infection with other viruses. If result is PRESUMPTIVE POSTIVE SARS-CoV-2 nucleic acids MAY BE PRESENT.   A presumptive positive result was obtained on the submitted specimen  and confirmed on repeat testing.  While 2019 novel coronavirus  (SARS-CoV-2) nucleic acids may be present in the submitted sample  additional confirmatory testing may be necessary for epidemiological  and / or clinical management purposes  to differentiate between  SARS-CoV-2 and other Sarbecovirus currently known to infect humans.  If clinically indicated additional testing with an alternate test  methodology [phone]) is advised. The SARS-CoV-2 RNA is generally  detectable in upper and lower respiratory sp ecimens during the acute  phase of infection. The expected result is Negative. Fact Sheet for Patients:  StrictlyIdeas.no Fact Sheet for Healthcare  Providers: BankingDealers.co.za This test is not yet approved or cleared by the [place] FDA and has been authorized for detection and/or diagnosis of SARS-CoV-2 by FDA under an Emergency Use Authorization (EUA).  This EUA will remain in effect (meaning this test can be used) for the duration of the COVID-19 declaration under Section 564(b)(1) of the Act, 21 U.S.C. section 360bbb-3(b)(1), unless the authorization is terminated or revoked sooner. Performed at [name], [place] [address] [place], [place] [address]   Urine culture     Status: None   Collection Time: 09/23/18  8:35 PM   Specimen: Urine, Catheterized  Result Value Ref Range Status   Specimen Description URINE, CATHETERIZED  Final   Special Requests NONE  Final   Culture   Final    NO GROWTH Performed at [name], [place] [address], [place], [place] [address]    Report Status 09/24/2018 FINAL  Final  C difficile quick scan w PCR reflex     Status: Abnormal   Collection Time: 09/23/18  8:35 PM   Specimen: Urine, Catheterized; Stool  Result Value Ref Range Status   C Diff antigen POSITIVE (A) NEGATIVE Final   C Diff toxin POSITIVE (A) NEGATIVE Final   C Diff interpretation Toxin producing C. difficile detected.  Final    Comment: CRITICAL RESULT CALLED TO, READ BACK BY AND VERIFIED WITH: A DENNIS RN 09/24/18 0015 JDW Performed at [name], [address], [place], [place] [address]   Gastrointestinal Panel by PCR , Stool     Status: None   Collection Time: 09/23/18  8:35 PM   Specimen: Stool  Result Value Ref Range Status   Campylobacter  species NOT DETECTED NOT DETECTED Final   Plesimonas shigelloides NOT DETECTED NOT DETECTED Final   Salmonella species NOT DETECTED NOT DETECTED Final   Yersinia enterocolitica NOT DETECTED NOT DETECTED Final   Vibrio species NOT DETECTED NOT DETECTED Final   Vibrio cholerae NOT DETECTED NOT DETECTED Final   Enteroaggregative E  coli (EAEC) NOT DETECTED NOT DETECTED Final   Enteropathogenic E coli (EPEC) NOT DETECTED NOT DETECTED Final   Enterotoxigenic E coli (ETEC) NOT DETECTED NOT DETECTED Final   Shiga like toxin producing E coli (STEC) NOT DETECTED NOT DETECTED Final   Shigella/Enteroinvasive E coli (EIEC) NOT DETECTED NOT DETECTED Final   Cryptosporidium NOT DETECTED NOT DETECTED Final   Cyclospora cayetanensis NOT DETECTED NOT DETECTED Final   Entamoeba histolytica NOT DETECTED NOT DETECTED Final   Giardia lamblia NOT DETECTED NOT DETECTED Final   Adenovirus F40/41 NOT DETECTED NOT DETECTED Final   Astrovirus NOT DETECTED NOT DETECTED Final   Norovirus [name]/GII NOT DETECTED NOT DETECTED Final   Rotavirus A NOT DETECTED NOT DETECTED Final   Sapovirus (I, II, IV, and V) NOT DETECTED NOT DETECTED Final    Comment: Performed at [name], [place] [place], [place] [address]  CSF culture     Status: None   Collection Time: 09/23/18 10:12 PM   Specimen: CSF; Cerebrospinal Fluid  Result Value Ref Range Status   Specimen Description CSF  Final   Special Requests NONE  Final   Gram Stain NO WBC SEEN NO ORGANISMS SEEN CYTOSPIN SMEAR   Final   Culture   Final    NO GROWTH 3 DAYS Performed at [name], [address], [place], [place] [address]    Report Status 09/27/2018 FINAL  Final  Culture, blood (routine x 2)     Status: None (Preliminary result)   Collection Time: 09/26/18 12:12 PM   Specimen: BLOOD RIGHT HAND  Result Value Ref Range Status   Specimen Description BLOOD RIGHT HAND  Final   Special Requests   Final    BOTTLES DRAWN AEROBIC ONLY Blood Culture adequate volume   Culture   Final    NO GROWTH < 24 HOURS Performed at [name], [place] [address] [place], [place] [address]    Report Status PENDING  Incomplete  Culture, blood (routine x 2)     Status: None (Preliminary result)   Collection Time: 09/26/18 12:25 PM   Specimen: BLOOD RIGHT HAND  Result  Value Ref Range Status   Specimen Description BLOOD RIGHT HAND  Final   Special Requests   Final    BOTTLES DRAWN AEROBIC ONLY Blood Culture adequate volume   Culture   Final    NO GROWTH < 24 HOURS Performed at [name], [place] [address] [place],  [address]    Report Status PENDING  Incomplete    Radiology Reports Ct Abdomen Pelvis Wo Contrast  Result Date: 09/26/2018 CLINICAL DATA:  Abdominal pain with gastroenteritis or colitis suspected. EXAM: CT ABDOMEN AND PELVIS WITHOUT CONTRAST TECHNIQUE: Multidetector CT imaging of the abdomen and pelvis was performed following the standard protocol without IV contrast. COMPARISON:  CT dated September 24, 2018 FINDINGS: Lower chest: There are small bilateral pleural effusions with adjacent airspace opacifications suggestive of atelectasis, however aspiration or developing pneumonia is not excluded. Hepatobiliary: The liver is unremarkable. There is cholelithiasis without definite evidence of acute cholecystitis. Pancreas: Unremarkable. No pancreatic ductal dilatation or surrounding inflammatory changes. Spleen: Normal in size  without focal abnormality. Adrenals/Urinary Tract: The native right kidney is not visualized. The native left kidney is atrophic. 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 There is some hyperdense material within the right renal collecting system. There is contrast within the urinary bladder. There is a small focus of gas within the urinary bladder and pelvic transplant kidney which may be secondary to instrumentation. Stomach/Bowel: Esophagus is dilated. There are postsurgical changes near the GE junction. The stomach is unremarkable. Again identified are findings of severe colitis. Oral contrast is seen to the level of the sigmoid colon. There is no contrast seen in the rectum. There is diffuse rectal wall thickening. The appendix is not reliably identified. Vascular/Lymphatic: Aortic atherosclerosis. No enlarged  abdominal or pelvic lymph nodes. Reproductive: Prostate is unremarkable. Other: There is new free fluid in the abdomen, greatest about the liver. No definite pneumatosis or free air. Musculoskeletal: There are healing fractures of the left inferior and superior pubic rami, the bilateral sacral all, the greater trochanter of the right femur, and the S2 vertebral body. IMPRESSION: 1. Examination limited by lack of IV contrast and lack of intra-abdominal fat. 2. Again noted are findings of severe pancolitis, not substantially improved from prior study. 3. New small volume free fluid in the abdomen. 4. New bibasilar airspace opacities concerning for atelectasis or aspiration. There are trace bilateral pleural effusions. 5. Dilated fluid-filled esophagus. 6. Cholelithiasis without CT evidence for acute cholecystitis. 7. There are foci of gas within the urinary bladder and right pelvic transplant kidney suggestive of recent instrumentation. 8. Again noted are multiple acute and subacute pelvic fractures as detailed above. Electronically Signed   By: Constance Holster M.D.   On: 09/26/2018 23:53   Ct Abdomen Pelvis Wo Contrast  Result Date: 09/24/2018 CLINICAL DATA:  63 year old male with diarrhea and sepsis. History of renal abscess and HIV. EXAM: CT ABDOMEN AND PELVIS WITHOUT CONTRAST TECHNIQUE: Multidetector CT imaging of the abdomen and pelvis was performed following the standard protocol without IV contrast. COMPARISON:  CT of the pelvis dated 08/25/2018 and CT of the abdomen pelvis dated 06/22/2014 FINDINGS: Evaluation of this exam is limited in the absence of intravenous contrast. Evaluation is also limited due to cachexia. Lower chest: The visualized lung bases are clear. No intra-abdominal free air. Probable mesenteric edema. Hepatobiliary: The liver is grossly unremarkable. Layering stones noted within the gallbladder. No pericholecystic fluid. Pancreas: The pancreas is grossly unremarkable as visualized.  Spleen: Normal in size without focal abnormality. Adrenals/Urinary Tract: The adrenal glands are grossly unremarkable. Atrophic left kidney. No hydronephrosis. 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 There is a punctate stone in the region of the transplant hilum. Mild fullness of the renal transplant collecting system. No peritransplant fluid collection. The urinary bladder is decompressed around a Foley catheter and contains a small pocket of air. Stomach/Bowel: There is diffuse inflammatory changes and thickening of the colon and rectosigmoid consistent with pancolitis. Correlation with clinical exam and stool cultures recommended. No bowel obstruction. Surgical clips in the upper abdomen noted. The appendix is not visualized. Vascular/Lymphatic: The abdominal aorta and IVC are grossly unremarkable on this noncontrast CT. No portal venous gas. Evaluation of the lymph nodes is very limited. No obvious adenopathy. Reproductive: The prostate gland is poorly visualized. Other: Severe cachexia. Musculoskeletal: Nondisplaced fracture of the greater trochanter of the right femur. 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 Old left pubic bone fractures. Fractures of the sacral alum as seen on the  prior CT with no healing. Nondisplaced fracture of the right L5 transverse process. IMPRESSION: 1. Pancolitis. Correlation with clinical exam and stool cultures recommended. No bowel obstruction. 2. Cholelithiasis. 3. Right lower quadrant renal transplant. No hydronephrosis. 4. Fractures of the greater trochanter of the right femur, right transverse process of L5, sacrum, and left pubic bone. 5. Cachexia. Electronically Signed   By: Anner Crete M.D.   On: 09/24/2018 03:44   Mr Lumbar Spine Wo Contrast  Result Date: 09/24/2018 CLINICAL DATA:  Initial evaluation for acute sacral fractures. EXAM: MRI LUMBAR SPINE WITHOUT CONTRAST TECHNIQUE: Multiplanar, multisequence MR imaging of the lumbar spine  was performed. No intravenous contrast was administered. COMPARISON:  Prior CT from earlier the same day. FINDINGS: Segmentation: Standard. Lowest well-formed disc labeled the L5-S1 level. Alignment: Mild levoscoliosis. Alignment otherwise normal with preservation of the normal lumbar lordosis. No listhesis or subluxation. Vertebrae: Abnormal marrow edema seen involving the left greater than right sacral ala, compatible with acute to subacute nondisplaced fractures. Findings stable from previous CT. Subtle linear signal intensity extending through the transverse processes of L5 also likely reflect acute to subacute nondisplaced fractures, also seen on prior CT. Vertebral body heights maintained with no other acute or chronic fracture identified. Bone marrow signal intensity diffusely decreased on T1 weighted imaging, most likely related to patient's underlying renal disease and anemia. No discrete or worrisome osseous lesions. No other abnormal marrow edema. Conus medullaris and cauda equina: Conus extends to the L1 level. Conus and cauda equina appear normal. Paraspinal and other soft tissues: Soft tissue edema seen about the lower lumbar spine and sacrum, likely posttraumatic in nature. No discrete hematoma or other collection. Changes compatible with acute colitis noted about the visualized descending colon. Right lower quadrant renal transplant partially visualized. Mild scattered free fluid noted within the visualized abdomen and pelvis. Disc levels: L1-2:  Unremarkable. L2-3:  Unremarkable. L3-4:  Unremarkable. L4-5: Mild diffuse disc bulge with disc desiccation. Superimposed broad-based central disc protrusion mildly indents the ventral thecal sac. Mild facet hypertrophy. Resultant mild bilateral lateral recess stenosis without neural impingement. Foramina remain patent. L5-S1: Chronic intervertebral disc space narrowing with diffuse disc bulge and disc desiccation. Superimposed central disc protrusion with  slight inferior angulation. Protruding disc indents the ventral thecal sac, slightly asymmetric to the right. Protruding disc closely approximates the descending S1 nerve roots without frank neural impingement, also greater on the right. Central canal remains patent. Mild bilateral L5 foraminal stenosis without impingement. IMPRESSION: 1. Acute to subacute bilateral sacral alar fractures, stable from prior CT. 2. Probable additional acute to subacute nondisplaced fractures of the bilateral transverse processes of L5, better seen on prior CT. 3. Mild diffuse soft tissue edema about the lower lumbar spine and sacrum, likely posttraumatic/reactive in nature. No discrete hematoma or other collection. 4. Small central disc protrusion at L5-S1, closely approximating the right greater than left descending S1 nerve roots without frank impingement. 5. Central disc protrusion at L4-5 with resultant mild bilateral lateral recess stenosis. No frank impingement. 6. Findings consistent with acute colitis about the visualized descending colon, better seen on prior CT. Electronically Signed   By: Jeannine Boga M.D.   On: 09/24/2018 17:02   US Renal Transplant W/doppler  Result Date: 09/25/2018 CLINICAL DATA:  Acute kidney injury EXAM: ULTRASOUND OF RENAL TRANSPLANT WITH RENAL DOPPLER ULTRASOUND TECHNIQUE: Ultrasound examination of the renal transplant was performed with gray-scale, color and duplex doppler evaluation. COMPARISON:  CT dated September 24, 2018 FINDINGS: Transplant kidney location: RLQ  Transplant Kidney: Renal measurements: 11.6 x 4.7 x 6.3 cm = volume: 147mL. The echogenicity is within normal limits. There is no hydronephrosis. There appears to be a small calcification in the upper pole measuring approximately 0.7 cm. Color flow in the main renal artery:  Yes Color flow in the main renal vein:  Yes Duplex Doppler Evaluation: Main Renal Artery Resistive Index: 0.76 Venous waveform in main renal vein:  Present  Intrarenal resistive index in upper pole:  0.69 (normal 0.6-0.8; equivocal 0.8-0.9; abnormal >= 0.9) Intrarenal resistive index in lower pole: 0.55 (normal 0.6-0.8; equivocal 0.8-0.9; abnormal >= 0.9) Bladder: Normal for degree of bladder distention. Other findings:  None. IMPRESSION: 1. Right lower quadrant pelvic transplant kidney with no evidence of hydronephrosis. 2. Normal resistive indices as detailed above. 3. Possible nonobstructing 7 mm stone in the upper pole of the transplant kidney. Electronically Signed   By: Constance Holster M.D.   On: 09/25/2018 21:30   Dg Chest Port 1 View  Result Date: 09/23/2018 CLINICAL DATA:  63 year old male with sepsis. EXAM: PORTABLE CHEST 1 VIEW COMPARISON:  Chest radiograph dated 01/22/2017 and CT dated 06/22/2014 FINDINGS: There is mild eventration of the left hemidiaphragm. There is no focal consolidation, pleural effusion, or pneumothorax. Mild prominence of the pulmonary vasculature with cephalization may represent mild congestion. Apparent 9 mm nodular density over the posterior right sixth rib likely an area of old healed fracture. A pulmonary nodule is less likely. Nonemergent CT may provide better evaluation if clinically indicated. Additional old healed right posterior rib fractures noted. There is a 5 mm nodular density over the left mid lung field, possibly a granuloma. The cardiac silhouette is within normal limits. There is dextroscoliosis of the thoracic spine. No acute osseous pathology. IMPRESSION: 1. No focal consolidation. 2. Possible mild vascular congestion. 3. Probable focus of old healed rib fracture versus less likely a 9 mm right pulmonary nodule. CT may provide better evaluation on a nonemergent basis. Electronically Signed   By: Anner Crete M.D.   On: 09/23/2018 19:11    Time Spent in minutes 35   Lyda Colcord M.D on 09/27/2018 at 11:02 AM  Between 7am to 7pm - Pager - [phone]  After 7pm go to [email or web address] - password  [name]  [facility] -  Office  [phone]

## 2018-09-27 NOTE — Progress Notes (Addendum)
[facility] for Infectious Disease    Date of Admission:  09/23/2018   Total days of antibiotics 5           ID: Kenneth [name] is a 63 y.o. male with hiv disease, recent DDKT admitted for severe sepsis 2/2 cdifficile complicated by AKI, leukopenia Principal Problem:   Severe sepsis ([place]) Active Problems:   HIV disease ([place])   AKI (acute kidney injury) ([place])   SLE (systemic lupus erythematosus) ([place])   History of renal transplant   Current chronic use of systemic steroids   C. difficile diarrhea    Subjective: Still significant abdominal pain, had repeat NCAbCT last night-showing pancolitis, no abdominal abscess or perforation per my read. He denies having BM. Poor oral intake in 24hr did not eat dinner or breakfast  Medications:   abacavir-dolutegravir-lamiVUDine  1 tablet Oral Daily   acyclovir  200 mg Oral TID   aspirin  81 mg Oral Daily   cholecalciferol  2,000 Units Oral Daily   feeding supplement (ENSURE ENLIVE)  237 mL Oral TID BM   heparin  5,000 Units Subcutaneous Q8H   hydroxychloroquine  200 mg Oral BID   magic mouthwash  5 mL Oral TID   multivitamin with minerals  1 tablet Oral Daily   nystatin  5 mL Oral QID   predniSONE  10 mg Oral Daily   sulfamethoxazole-trimethoprim  1 tablet Oral 3 times weekly   tamsulosin  0.4 mg Oral QHS   vancomycin  500 mg Oral TID AC & HS    Objective: Vital signs in last 24 hours: Temp:  [97.6 F (36.4 C)-99.2 F (37.3 C)] 97.8 F (36.6 C) (06/26 0541) Pulse Rate:  [67-111] 67 (06/26 0541) Resp:  [16-18] 18 (06/26 0541) BP: (121-126)/(73-97) 121/97 (06/26 0541) SpO2:  [71 %-100 %] 100 % (06/26 0555) Weight:  [57.6 kg] 57.6 kg (06/26 0555) Physical Exam  Constitutional: He is oriented to person, place, and time. He appears chronically ill, cachetic. No distress.  HENT:  Mouth/Throat: Oropharynx is dry. No oropharyngeal exudate.  Cardiovascular: Normal rate, regular rhythm and normal heart sounds.  Exam reveals no gallop and no friction rub.  No murmur heard.  Pulmonary/Chest: Effort normal and breath sounds normal. No respiratory distress. He has no wheezes.  Abdominal: . Bowel sounds diminished. He has distension. And tenderness on palpation.  Neurological: He is alert and oriented to person, place, and time.  Skin: Skin is warm and dry. No rash noted. No erythema.   Lab Results Recent Labs    09/26/18 0229 09/26/18 0751 09/27/18 0931  WBC 1.6* 1.3* 1.3*  HGB 11.6* 12.4* 11.6*  HCT 32.5* 34.9* 31.7*  NA 133*  --  134*  K 3.7  --  3.4*  CL 106  --  88*  CO2 17*  --  35*  BUN 94*  --  82*  CREATININE 3.11*  --  3.13*   Liver Panel Recent Labs    09/27/18 0931  ALBUMIN 1.5*     Microbiology: reviewed Studies/Results: Ct Abdomen Pelvis Wo Contrast  Result Date: 09/26/2018 CLINICAL DATA:  Abdominal pain with gastroenteritis or colitis suspected. EXAM: CT ABDOMEN AND PELVIS WITHOUT CONTRAST TECHNIQUE: Multidetector CT imaging of the abdomen and pelvis was performed following the standard protocol without IV contrast. COMPARISON:  CT dated September 24, 2018 FINDINGS: Lower chest: There are small bilateral pleural effusions with adjacent airspace opacifications suggestive of atelectasis, however aspiration or developing pneumonia is not excluded. Hepatobiliary: The  liver is unremarkable. There is cholelithiasis without definite evidence of acute cholecystitis. Pancreas: Unremarkable. No pancreatic ductal dilatation or surrounding inflammatory changes. Spleen: Normal in size without focal abnormality. Adrenals/Urinary Tract: The native right kidney is not visualized. The native left kidney is atrophic. 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 There is some hyperdense material within the right renal collecting system. There is contrast within the urinary bladder. There is a small focus of gas within the urinary bladder and pelvic transplant kidney which may be secondary to  instrumentation. Stomach/Bowel: Esophagus is dilated. There are postsurgical changes near the GE junction. The stomach is unremarkable. Again identified are findings of severe colitis. Oral contrast is seen to the level of the sigmoid colon. There is no contrast seen in the rectum. There is diffuse rectal wall thickening. The appendix is not reliably identified. Vascular/Lymphatic: Aortic atherosclerosis. No enlarged abdominal or pelvic lymph nodes. Reproductive: Prostate is unremarkable. Other: There is new free fluid in the abdomen, greatest about the liver. No definite pneumatosis or free air. Musculoskeletal: There are healing fractures of the left inferior and superior pubic rami, the bilateral sacral all, the greater trochanter of the right femur, and the S2 vertebral body. IMPRESSION: 1. Examination limited by lack of IV contrast and lack of intra-abdominal fat. 2. Again noted are findings of severe pancolitis, not substantially improved from prior study. 3. New small volume free fluid in the abdomen. 4. New bibasilar airspace opacities concerning for atelectasis or aspiration. There are trace bilateral pleural effusions. 5. Dilated fluid-filled esophagus. 6. Cholelithiasis without CT evidence for acute cholecystitis. 7. There are foci of gas within the urinary bladder and right pelvic transplant kidney suggestive of recent instrumentation. 8. Again noted are multiple acute and subacute pelvic fractures as detailed above. Electronically Signed   By: Constance Holster M.D.   On: 09/26/2018 23:53   US Renal Transplant W/doppler  Result Date: 09/25/2018 CLINICAL DATA:  Acute kidney injury EXAM: ULTRASOUND OF RENAL TRANSPLANT WITH RENAL DOPPLER ULTRASOUND TECHNIQUE: Ultrasound examination of the renal transplant was performed with gray-scale, color and duplex doppler evaluation. COMPARISON:  CT dated September 24, 2018 FINDINGS: Transplant kidney location: RLQ Transplant Kidney: Renal measurements: 11.6 x 4.7 x  6.3 cm = volume: 123mL. The echogenicity is within normal limits. There is no hydronephrosis. There appears to be a small calcification in the upper pole measuring approximately 0.7 cm. Color flow in the main renal artery:  Yes Color flow in the main renal vein:  Yes Duplex Doppler Evaluation: Main Renal Artery Resistive Index: 0.76 Venous waveform in main renal vein:  Present Intrarenal resistive index in upper pole:  0.69 (normal 0.6-0.8; equivocal 0.8-0.9; abnormal >= 0.9) Intrarenal resistive index in lower pole: 0.55 (normal 0.6-0.8; equivocal 0.8-0.9; abnormal >= 0.9) Bladder: Normal for degree of bladder distention. Other findings:  None. IMPRESSION: 1. Right lower quadrant pelvic transplant kidney with no evidence of hydronephrosis. 2. Normal resistive indices as detailed above. 3. Possible nonobstructing 7 mm stone in the upper pole of the transplant kidney. Electronically Signed   By: Constance Holster M.D.   On: 09/25/2018 21:30     Assessment/Plan: Fulminant cdifficile colitis = recommend general surgery to evaluate, in case patient worsens would recommend surgical option. Continue on high dose oral vancomycin and IV metronidazole. He reports no BM in 2 days concern for ileus related to severe/fulminant cdifficile colitis. Will do trial of vancomycin enema to see if that improves his symptoms. Please check lactic acid  Renal  transplant = patient has tacro level pending. Cr unchanged from yesterday. MMF held. Defer to nephro for management of IS  Leukopenia = difficult to tell if all related to drug SE vs possible sepsis from cdiff  hiv disease = continue on triumeq daily.   [facility] for Infectious Diseases Cell: [phone] Pager: [phone]  09/27/2018, 10:39 AM

## 2018-09-27 NOTE — Progress Notes (Signed)
[facility] Progress Note  Name: Kenneth [name] MRN: [id number] DOB: Sep 05, 1955  Chief Complaint:  Diarrhea   Subjective:  [name] hasn't yet had a bed.  Prograf level (not a true trough) came back elevated at 41.9 and we held this AM's dose.  Bicarb gtt was discontinued given severe hypocalcemia.  Strict ins/outs are not available - he wants to hold off on a foley if possible.  Continue with 7/10 abdominal pain - last CT yesterday; pain unchanged per pt  Review of systems:  Denies any diarrhea; states no BM today or yesterday abdominal pain  Denies n/v Denies shortness of breath  States he is making urine - strict in/outs not recorded --------------- Background on consult:  Kenneth [name] is a 63 y.o. male with a history of ESRD s/p renal transplant at [facility] 04/13/2018 as well as HIV who presented with diarrhea and decreased PO intake.  He was found to have C dif colitis.  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  Note Cr 1.1 on 09/05/18.   He was found to be c dif positive and strep bacteremia.  He has been on PO vanc and IV flagyl and cefepime for the bacteremia.  He has also received normal saline.  Creatinine has improved from 3.51 to 2.99 on consult.  Note SARS coronavirus 2 negative.  He had a recent endoscopy at [facility] which demonstrated ulcerations in the mid and distal esophagus per charting.  No pain over transplant.  Lives at a facility and reports compliance with meds.  Appears a FK506 level drawn earlier today may not be a trough.  He has a foley and this is about to be changed to a condom cath per primary team.  Abdominal CT with punctate stone in the region of the transplant hilum. Mild fullness of the renal transplant collecting system. No peritransplant fluid collection. The urinary bladder is decompressed around a Foley catheter and contains a small pocket of air.  We discussed his renal function.  If his renal  function declines to the point that he would need it, he would want hemodialysis.  Spoke with transplant neph at [facility].  Hold MMF for now.  Will reassess restarting in 1-2 days once diarrhea improved.     Intake/Output Summary (Last 24 hours) at 09/27/2018 1253 Last data filed at 09/27/2018 0954 Gross per 24 hour  Intake 3740 ml  Output 150 ml  Net 3590 ml    Vitals:  Vitals:   09/26/18 1159 09/26/18 2050 09/27/18 0541 09/27/18 0555  BP: 126/73 121/76 (!) 121/97   Pulse: (!) 111 (!) 110 67   Resp: 16 17 18    Temp: 99.2 F (37.3 C) 97.6 F (36.4 C) 97.8 F (36.6 C)   TempSrc: Axillary Oral Oral   SpO2: 97% 100% (!) 71% 100%  Weight:    57.6 kg  Height:         Physical Exam:  General thin adult male in bed in no acute distress at rest HEENT dry mm; normocephalic atraumatic extraocular movements intact sclera anicteric Neck supple trachea midline Lungs clear to auscultation bilaterally normal work of breathing at rest  Heart tachycardia, S1S2 no rub Abdomen thin distended; abdomen is tender  Extremities no lower extremity edema  Psych normal mood and affect GU - condom cath  RLQ allograft   Medications reviewed   Labs:  BMP Latest Ref Rng & Units 09/27/2018 09/26/2018 09/25/2018  Glucose 70 - 99 mg/dL 426(H) 213(H) 109(H)  BUN 8 - 23 mg/dL 82(H) 94(H) 91(H)  Creatinine 0.61 - 1.24 mg/dL 3.13(H) 3.11(H) 2.99(H)  BUN/Creat Ratio 6 - 22 (calc) - - -  Sodium 135 - 145 mmol/L 134(L) 133(L) 133(L)  Potassium 3.5 - 5.1 mmol/L 3.4(L) 3.7 4.3  Chloride 98 - 111 mmol/L 88(L) 106 109  CO2 22 - 32 mmol/L 35(H) 17(L) 13(L)  Calcium 8.9 - 10.3 mg/dL 5.6(LL) 6.5(L) 6.7(L)     Assessment/Plan:   # AKI  - Pre-renal insults in the setting of severe cdif colitis and sepsis and also with supratherapeutic prograf trough   - Continue supportive care with IV fluids - transition to normal saline  - Hopeful for improvement with temporarily holding the prograf but difficult to titrate dose  as a send-out lab and haven't been able to get actual troughs - Continue flomax  - Repeat up/cr ratio - still not collected  - Monitor for retention and low threshold for foley reinsertion; bladder scan 60 mL this AM - Strict ins/outs   # Metabolic acidosis - GI losses as well as AKI  - s/p bicarb infusion now off - Repeat BMP - last does not appear accurate  # ESRD s/p renal transplant  - Immunosuppression as below.  Cr 1.1 on 09/05/18 - Transplant ultrasound with no evidence of hydro and possible nonobstructing 7 mm stone in the upper pole of the transplant kidney.  # Severe c difficile colitis with abdominal pain - on PO vanc as well as IV flagyl - Severe colitis on CT - last imaged yesterday - Primary team is following closely   # Streptococcus bacteremia - on penicillin per ID   # Immunosuppression  - Normally on cellcept 250 mg BID and prograf 1.5 mg BID and prednisone 10 mg daily  - I have contacted transplant team at [facility]  For now, we have discontinued cellcept  - Held this AM's prograf dose given the FK506 level was 41.9.  (Markedly elevated.  obtained at 9:22 am on 6/24 after an 8:41 am dose.  There is another level - also not an actual trough - drawn at 2:29 am on 6/25) - Titration of immunsuppression is hindered by the delay in return of results   - Continue prednisone for now   # Hypocalcemia - Replete IV calcium x 2 - Off of bicarb gtt - Check ionized calcium  - Repeating BMP    # Hematuria  - Resolved on repeat UA  # SLE  - on hydroxychloroquine; stopping cellcept for now as above   # HIV  - Per transplant ID would continue current regimen   # Oral ulcers - Per primary team the PO acyclovir was started for oral ulcers  Given lack of improvement and recent transplant status I have spoken with [name] - Dr. Aquilla Hacker and he has agreed that the patient would be better served at their transplant center.  His primary team has been talking with the  transfer center and they are waiting on a bed at [facility], MD 09/27/2018 12:53 PM

## 2018-09-27 NOTE — Progress Notes (Signed)
Lab call in a critical Calcium of 5.6, MD Dhungel notified via amion.

## 2018-09-27 NOTE — Consult Note (Signed)
Reason for Consult: Ileus Referring Physician: Hospital team  Kenneth [name] is an 63 y.o. male.  HPI: Patient seen and examined in his hospital computer chart reviewed and our office computer chart reviewed and he is a little lethargic so the history is a little difficult but he denies any diarrhea and has not had a bowel movement in a few days and his abdomen is more swollen than usual and he did have an essentially unremarkable colonoscopy 2 years ago and he is about to be transferred to [facility] when a bed is available and he has been seen by the infectious disease doctors and is being treated for C. difficile but does not remember the last time he was on antibiotics although he may be on chronic Bactrim and has no other complaints  Past Medical History:  Diagnosis Date  . Achalasia   . Candida infection, esophageal ([place]) 08/2010   a. 2012 - noted on EGD.  Marland Kitchen DVT (deep venous thrombosis) ([place]) ~ 04/2014   ?RLE  . Dysphagia 2008   post heller myotomy/toupee fundoplication.    . ESRD (end stage renal disease) on dialysis [facility])    "MWF: [name]" (01/22/2017)  . Fall 01/22/2017   mechanical fall w/multiple fractures/notes 01/22/2017  . GERD (gastroesophageal reflux disease)    hx (01/22/2017)  . Hemorrhoids, internal   . History of blood transfusion 03/2014   "low counts"  . History of gout   . History of stomach ulcers   . HIV infection ([place]) dx ~ 2009   a. 02/13/2014 CD4 = 240 - undetectable viral load.  . Hyperlipidemia   . Hypertension    hx (01/22/2017)  . Insomnia   . Lupus nephritis ([place])   . Pancytopenia ([place])   . Premature ventricular contractions   . Renal abscess   . SLE (systemic lupus erythematosus) ([place]) dx'd 2015    Past Surgical History:  Procedure Laterality Date  . BASCILIC VEIN TRANSPOSITION Left 06/29/2014   Procedure: FIRST STAGE  [place];  Surgeon: Rosetta Posner, MD;  Location: [place];  Service: Vascular;  Laterality: Left;  . BASCILIC  VEIN TRANSPOSITION Left 09/11/2014   Procedure: SECOND STAGE BASILIC VEIN TRANSPOSITION LEFT UPPER ARM;  Surgeon: Rosetta Posner, MD;  Location: [place];  Service: Vascular;  Laterality: Left;  . COLONOSCOPY  2008   .  tortuous colon, internal hemorrhoids.  no polyps or diverticulosis.   Marland Kitchen ESOPHAGOGASTRODUODENOSCOPY  08/2010   Dr [name].  08/2010 candida esophagitis, no obvious esophageal stricture. 02/2006 and 05/2006 dilated esophagus, narrowed distal esophagus but no stricture, was empirically Savary dilated  . ESOPHAGOGASTRODUODENOSCOPY N/A 06/27/2014   Procedure: ESOPHAGOGASTRODUODENOSCOPY (EGD);  Surgeon: [name], MD;  Location: [facility] ENDOSCOPY;  Service: Endoscopy;  Laterality: N/A;  . ESOPHAGOGASTRODUODENOSCOPY N/A 07/02/2014   Procedure: ESOPHAGOGASTRODUODENOSCOPY (EGD);  Surgeon: [name], MD;  Location: [facility] ENDOSCOPY;  Service: Endoscopy;  Laterality: N/A;  with botox  . [place]   with toupee fundoplication of HH.   . INSERTION OF DIALYSIS CATHETER Right 06/29/2014   Procedure: INSERTION OF DIALYSIS CATHETER RIGHT IJ;  Surgeon: Rosetta Posner, MD;  Location: [place];  Service: Vascular;  Laterality: Right;  . 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 NEPHRECTOMY Right ~ 2011  . ORIF TIBIA PLATEAU Right 01/23/2017   Procedure: OPEN REDUCTION INTERNAL FIXATION (ORIF) TIBIAL PLATEAU; REPAIR OF LATERAL TIBIAL PLATEAU; ARTHROTOMY WITH MENISCUS REPAIR; ANTERIOR COMPARTMENT FASCIOTOMY;  Surgeon: Altamese ,  MD;  Location: [place];  Service: Orthopedics;  Laterality: Right;    Family History  Problem Relation Age of Onset  . Colon cancer Father        deceased  . Other Mother        s/p pacemaker - alive and well.  . Hypertension Mother   . Other Other        5 brothers, 3 sisters - alive and well.    Social History:  reports that he has never smoked. He has never used smokeless tobacco. He reports that he does not drink alcohol or use drugs.  Allergies: No Known  Allergies  Medications: I have reviewed the patient's current medications.  Results for orders placed or performed during the hospital encounter of 09/23/18 (from the past 48 hour(s))  Troponin I (High Sensitivity)     Status: Abnormal   Collection Time: 09/25/18 11:33 PM  Result Value Ref Range   Troponin I (High Sensitivity) 23 (H) <18 ng/L    Comment: (NOTE) Elevated high sensitivity troponin I (hsTnI) values and significant  changes across serial measurements may suggest ACS but many other  chronic and acute conditions are known to elevate hsTnI results.  Refer to the "Links" section for chest pain algorithms and additional  guidance. Performed at [facility], [place] [address] [place], [place] [address]   CBC     Status: Abnormal   Collection Time: 09/26/18  2:29 AM  Result Value Ref Range   WBC 1.6 (L) 4.0 - 10.5 K/uL   RBC 3.76 (L) 4.22 - 5.81 MIL/uL   Hemoglobin 11.6 (L) 13.0 - 17.0 g/dL   HCT 32.5 (L) 39.0 - 52.0 %   MCV 86.4 80.0 - 100.0 fL   MCH 30.9 26.0 - 34.0 pg   MCHC 35.7 30.0 - 36.0 g/dL   RDW 13.6 11.5 - 15.5 %   Platelets 175 150 - 400 K/uL   nRBC 0.0 0.0 - 0.2 %    Comment: Performed at [facility], [place] [address], [place], [place] [address]  Basic metabolic panel     Status: Abnormal   Collection Time: 09/26/18  2:29 AM  Result Value Ref Range   Sodium 133 (L) 135 - 145 mmol/L   Potassium 3.7 3.5 - 5.1 mmol/L   Chloride 106 98 - 111 mmol/L   CO2 17 (L) 22 - 32 mmol/L   Glucose, Bld 213 (H) 70 - 99 mg/dL   BUN 94 (H) 8 - 23 mg/dL   Creatinine, Ser 3.11 (H) 0.61 - 1.24 mg/dL   Calcium 6.5 (L) 8.9 - 10.3 mg/dL   GFR calc non Af Amer 21 (L) >60 mL/min   GFR calc Af Amer 24 (L) >60 mL/min   Anion gap 10 5 - 15    Comment: Performed at [facility] [address], [place], [place] [address]  Troponin I (High Sensitivity)     Status: Abnormal   Collection Time: 09/26/18  2:29 AM  Result Value Ref Range   Troponin I (High Sensitivity)  22 (H) <18 ng/L    Comment: (NOTE) Elevated high sensitivity troponin I (hsTnI) values and significant  changes across serial measurements may suggest ACS but many other  chronic and acute conditions are known to elevate hsTnI results.  Refer to the "Links" section for chest pain algorithms and additional  guidance. Performed at [facility], [place] [address], [place],  [address]   CBC with  Differential/Platelet     Status: Abnormal   Collection Time: 09/26/18  7:51 AM  Result Value Ref Range   WBC 1.3 (LL) 4.0 - 10.5 K/uL    Comment: This critical result has verified and been called to J FALLS RN by Elaina Pattee on 06 25 2020 at 0926, and has been read back.  REPEATED TO VERIFY CORRECTED ON 06/26 AT 1058: PREVIOUSLY REPORTED AS 1.3 This critical result has verified and been called to J FALLS RN by Elaina Pattee on 06 25 2020 at 0926, and has been read back.     RBC 4.04 (L) 4.22 - 5.81 MIL/uL   Hemoglobin 12.4 (L) 13.0 - 17.0 g/dL   HCT 34.9 (L) 39.0 - 52.0 %   MCV 86.4 80.0 - 100.0 fL   MCH 30.7 26.0 - 34.0 pg   MCHC 35.5 30.0 - 36.0 g/dL   RDW 13.5 11.5 - 15.5 %   Platelets 156 150 - 400 K/uL   nRBC 0.0 0.0 - 0.2 %   Neutrophils Relative % 70 %   Neutro Abs 0.9 (L) 1.7 - 7.7 K/uL   Lymphocytes Relative 0 %   Lymphs Abs 0.0 (L) 0.7 - 4.0 K/uL   Monocytes Relative 30 %   Monocytes Absolute 0.4 0.1 - 1.0 K/uL   Eosinophils Relative 0 %   Eosinophils Absolute 0.0 0.0 - 0.5 K/uL   Basophils Relative 0 %   Basophils Absolute 0.0 0.0 - 0.1 K/uL   WBC Morphology See Note     Comment: Toxic Granulation   nRBC 4 (H) 0 /100 WBC   Abs Immature Granulocytes 0.00 0.00 - 0.07 K/uL    Comment: Performed at [facility], [place] [address] [place], [place] [address]  VITAMIN D 25 Hydroxy (Vit-D Deficiency, Fractures)     Status: Abnormal   Collection Time: 09/26/18 11:15 AM  Result Value Ref Range   Vit D, 25-Hydroxy 16.4 (L) 30.0 - 100.0 ng/mL    Comment:  (NOTE) Vitamin D deficiency has been defined by the Institute of Medicine and an Endocrine Society practice guideline as a level of serum 25-OH vitamin D less than 20 ng/mL (1,2). The Endocrine Society went on to further define vitamin D insufficiency as a level between 21 and 29 ng/mL (2). 1. IOM (Institute of Medicine). 2010. Dietary reference   intakes for calcium and D. [place]: The   [employer]. 2. Holick MF, Binkley Secretary, Bischoff-Ferrari HA, et al.   Evaluation, treatment, and prevention of vitamin D   deficiency: an Endocrine Society clinical practice   guideline. JCEM. 2011 Jul; 96(7):1911-30. Performed At: [facility] [place], [place] [id number] Rush Farmer MD [id number]   Culture, blood (routine x 2)     Status: None (Preliminary result)   Collection Time: 09/26/18 12:12 PM   Specimen: BLOOD RIGHT HAND  Result Value Ref Range   Specimen Description BLOOD RIGHT HAND    Special Requests      BOTTLES DRAWN AEROBIC ONLY Blood Culture adequate volume   Culture      NO GROWTH < 24 HOURS Performed at [facility], [address], [place],  [address]    Report Status PENDING   Culture, blood (routine x 2)     Status: None (Preliminary result)   Collection Time: 09/26/18 12:25 PM   Specimen: BLOOD RIGHT HAND  Result Value Ref Range   Specimen Description BLOOD RIGHT HAND    Special Requests  BOTTLES DRAWN AEROBIC ONLY Blood Culture adequate volume   Culture      NO GROWTH < 24 HOURS Performed at [facility] [address], [place], [place] [address]    Report Status PENDING   Urinalysis, Routine w reflex microscopic     Status: Abnormal   Collection Time: 09/26/18  3:00 PM  Result Value Ref Range   Color, Urine AMBER (A) YELLOW    Comment: BIOCHEMICALS MAY BE AFFECTED BY COLOR   APPearance HAZY (A) CLEAR   Specific Gravity, Urine 1.023 1.005 - 1.030   pH 5.0 5.0 - 8.0   Glucose, UA NEGATIVE  NEGATIVE mg/dL   Hgb urine dipstick NEGATIVE NEGATIVE   Bilirubin Urine NEGATIVE NEGATIVE   Ketones, ur NEGATIVE NEGATIVE mg/dL   Protein, ur 30 (A) NEGATIVE mg/dL   Nitrite NEGATIVE NEGATIVE   Leukocytes,Ua TRACE (A) NEGATIVE   RBC / HPF 0-5 0 - 5 RBC/hpf   WBC, UA 6-10 0 - 5 WBC/hpf   Bacteria, UA RARE (A) NONE SEEN   Budding Yeast PRESENT     Comment: Performed at [facility], [place] [address], [place], [place] [address]  CBC     Status: Abnormal   Collection Time: 09/27/18  9:31 AM  Result Value Ref Range   WBC 1.3 (LL) 4.0 - 10.5 K/uL    Comment: REPEATED TO VERIFY CRITICAL VALUE NOTED.  VALUE IS CONSISTENT WITH PREVIOUSLY REPORTED AND CALLED VALUE.    RBC 3.69 (L) 4.22 - 5.81 MIL/uL   Hemoglobin 11.6 (L) 13.0 - 17.0 g/dL   HCT 31.7 (L) 39.0 - 52.0 %   MCV 85.9 80.0 - 100.0 fL   MCH 31.4 26.0 - 34.0 pg   MCHC 36.6 (H) 30.0 - 36.0 g/dL   RDW 13.5 11.5 - 15.5 %   Platelets 103 (L) 150 - 400 K/uL    Comment: Immature Platelet Fraction may be clinically indicated, consider ordering this additional test [id number]    nRBC 0.0 0.0 - 0.2 %    Comment: Performed at [facility], [place] [address] [place], [place] [address]  Renal function panel     Status: Abnormal   Collection Time: 09/27/18  9:31 AM  Result Value Ref Range   Sodium 134 (L) 135 - 145 mmol/L   Potassium 3.4 (L) 3.5 - 5.1 mmol/L   Chloride 88 (L) 98 - 111 mmol/L   CO2 35 (H) 22 - 32 mmol/L   Glucose, Bld 426 (H) 70 - 99 mg/dL   BUN 82 (H) 8 - 23 mg/dL   Creatinine, Ser 3.13 (H) 0.61 - 1.24 mg/dL   Calcium 5.6 (LL) 8.9 - 10.3 mg/dL    Comment: CRITICAL RESULT CALLED TO, READ BACK BY AND VERIFIED WITH: Lorain Childes [id number] SF/WILDERK    Phosphorus 1.7 (L) 2.5 - 4.6 mg/dL   Albumin 1.5 (L) 3.5 - 5.0 g/dL   GFR calc non Af Amer 20 (L) >60 mL/min   GFR calc Af Amer 24 (L) >60 mL/min   Anion gap 11 5 - 15    Comment: Performed at [facility] [address] [place], [place] [address]   Magnesium     Status: None   Collection Time: 09/27/18  9:31 AM  Result Value Ref Range   Magnesium 2.2 1.7 - 2.4 mg/dL    Comment: Performed at [facility] [address] [place], [place] [address]  Lactic acid, plasma  Status: Abnormal   Collection Time: 09/27/18 10:43 AM  Result Value Ref Range   Lactic Acid, Venous 2.6 (HH) 0.5 - 1.9 mmol/L    Comment: CRITICAL RESULT CALLED TO, READ BACK BY AND VERIFIED WITH: G.CHARLES,RN 1149 09/27/2018 CLARK,S Performed at [facility] [address] [place], [place] [address]     Ct Abdomen Pelvis Wo Contrast  Result Date: 09/26/2018 CLINICAL DATA:  Abdominal pain with gastroenteritis or colitis suspected. EXAM: CT ABDOMEN AND PELVIS WITHOUT CONTRAST TECHNIQUE: Multidetector CT imaging of the abdomen and pelvis was performed following the standard protocol without IV contrast. COMPARISON:  CT dated September 24, 2018 FINDINGS: Lower chest: There are small bilateral pleural effusions with adjacent airspace opacifications suggestive of atelectasis, however aspiration or developing pneumonia is not excluded. Hepatobiliary: The liver is unremarkable. There is cholelithiasis without definite evidence of acute cholecystitis. Pancreas: Unremarkable. No pancreatic ductal dilatation or surrounding inflammatory changes. Spleen: Normal in size without focal abnormality. Adrenals/Urinary Tract: The native right kidney is not visualized. The native left kidney is atrophic. 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 There is some hyperdense material within the right renal collecting system. There is contrast within the urinary bladder. There is a small focus of gas within the urinary bladder and pelvic transplant kidney which may be secondary to instrumentation. Stomach/Bowel: Esophagus is dilated. There are postsurgical changes near the GE junction. The stomach is unremarkable. Again identified are findings of severe colitis. Oral contrast is  seen to the level of the sigmoid colon. There is no contrast seen in the rectum. There is diffuse rectal wall thickening. The appendix is not reliably identified. Vascular/Lymphatic: Aortic atherosclerosis. No enlarged abdominal or pelvic lymph nodes. Reproductive: Prostate is unremarkable. Other: There is new free fluid in the abdomen, greatest about the liver. No definite pneumatosis or free air. Musculoskeletal: There are healing fractures of the left inferior and superior pubic rami, the bilateral sacral all, the greater trochanter of the right femur, and the S2 vertebral body. IMPRESSION: 1. Examination limited by lack of IV contrast and lack of intra-abdominal fat. 2. Again noted are findings of severe pancolitis, not substantially improved from prior study. 3. New small volume free fluid in the abdomen. 4. New bibasilar airspace opacities concerning for atelectasis or aspiration. There are trace bilateral pleural effusions. 5. Dilated fluid-filled esophagus. 6. Cholelithiasis without CT evidence for acute cholecystitis. 7. There are foci of gas within the urinary bladder and right pelvic transplant kidney suggestive of recent instrumentation. 8. Again noted are multiple acute and subacute pelvic fractures as detailed above. Electronically Signed   By: Constance Holster M.D.   On: 09/26/2018 23:53   US Renal Transplant W/doppler  Result Date: 09/25/2018 CLINICAL DATA:  Acute kidney injury EXAM: ULTRASOUND OF RENAL TRANSPLANT WITH RENAL DOPPLER ULTRASOUND TECHNIQUE: Ultrasound examination of the renal transplant was performed with gray-scale, color and duplex doppler evaluation. COMPARISON:  CT dated September 24, 2018 FINDINGS: Transplant kidney location: RLQ Transplant Kidney: Renal measurements: 11.6 x 4.7 x 6.3 cm = volume: 151mL. The echogenicity is within normal limits. There is no hydronephrosis. There appears to be a small calcification in the upper pole measuring approximately 0.7 cm. Color flow in the  main renal artery:  Yes Color flow in the main renal vein:  Yes Duplex Doppler Evaluation: Main Renal Artery Resistive Index: 0.76 Venous waveform in main renal vein:  Present Intrarenal resistive index in upper pole:  0.69 (normal 0.6-0.8; equivocal 0.8-0.9; abnormal >= 0.9) Intrarenal  resistive index in lower pole: 0.55 (normal 0.6-0.8; equivocal 0.8-0.9; abnormal >= 0.9) Bladder: Normal for degree of bladder distention. Other findings:  None. IMPRESSION: 1. Right lower quadrant pelvic transplant kidney with no evidence of hydronephrosis. 2. Normal resistive indices as detailed above. 3. Possible nonobstructing 7 mm stone in the upper pole of the transplant kidney. Electronically Signed   By: Constance Holster M.D.   On: 09/25/2018 21:30    ROS negative except above Blood pressure (!) 121/97, pulse 67, temperature 97.8 F (36.6 C), temperature source Oral, resp. rate 18, height 5\' 11"  (1.803 m), weight 57.6 kg, SpO2 100 %. Physical Exam patient a little lethargic slurring speech no acute distress abdomen distended with mild tympany rare bowel sounds minimal discomfort without guarding or rebound 2 CTs reviewed decreased white count decreased potassium  Assessment/Plan: Ileus questionably related to C. difficile Plan: Consider adding vancomycin enemas and carefully increase K above 4 and will ask my partner to check on him tomorrow otherwise continue recommendations per ID and consider surgical consult if patient deteriorates  Sneha Willig E 09/27/2018, 2:41 PM

## 2018-09-28 DIAGNOSIS — B950 Streptococcus, group A, as the cause of diseases classified elsewhere: Secondary | ICD-10-CM

## 2018-09-28 DIAGNOSIS — D61818 Other pancytopenia: Secondary | ICD-10-CM

## 2018-09-28 DIAGNOSIS — N186 End stage renal disease: Secondary | ICD-10-CM

## 2018-09-28 DIAGNOSIS — I499 Cardiac arrhythmia, unspecified: Secondary | ICD-10-CM

## 2018-09-28 DIAGNOSIS — Z992 Dependence on renal dialysis: Secondary | ICD-10-CM

## 2018-09-28 DIAGNOSIS — K5100 Ulcerative (chronic) pancolitis without complications: Secondary | ICD-10-CM

## 2018-09-28 LAB — CBC WITH DIFFERENTIAL/PLATELET
Abs Immature Granulocytes: 0.56 10*3/uL — ABNORMAL HIGH (ref 0.00–0.07)
Basophils Absolute: 0 10*3/uL (ref 0.0–0.1)
Basophils Relative: 1 %
Eosinophils Absolute: 0 10*3/uL (ref 0.0–0.5)
Eosinophils Relative: 0 %
HCT: 35.1 % — ABNORMAL LOW (ref 39.0–52.0)
Hemoglobin: 12.4 g/dL — ABNORMAL LOW (ref 13.0–17.0)
Immature Granulocytes: 26 %
Lymphocytes Relative: 8 %
Lymphs Abs: 0.2 10*3/uL — ABNORMAL LOW (ref 0.7–4.0)
MCH: 30.8 pg (ref 26.0–34.0)
MCHC: 35.3 g/dL (ref 30.0–36.0)
MCV: 87.3 fL (ref 80.0–100.0)
Monocytes Absolute: 0.1 10*3/uL (ref 0.1–1.0)
Monocytes Relative: 3 %
Neutro Abs: 1.4 10*3/uL — ABNORMAL LOW (ref 1.7–7.7)
Neutrophils Relative %: 62 %
Platelets: 86 10*3/uL — ABNORMAL LOW (ref 150–400)
RBC: 4.02 MIL/uL — ABNORMAL LOW (ref 4.22–5.81)
RDW: 13.9 % (ref 11.5–15.5)
WBC Morphology: INCREASED
WBC: 2.2 10*3/uL — ABNORMAL LOW (ref 4.0–10.5)
nRBC: 0.9 % — ABNORMAL HIGH (ref 0.0–0.2)

## 2018-09-28 LAB — BASIC METABOLIC PANEL
Anion gap: 13 (ref 5–15)
BUN: 96 mg/dL — ABNORMAL HIGH (ref 8–23)
CO2: 19 mmol/L — ABNORMAL LOW (ref 22–32)
Calcium: 6.7 mg/dL — ABNORMAL LOW (ref 8.9–10.3)
Chloride: 99 mmol/L (ref 98–111)
Creatinine, Ser: 3.73 mg/dL — ABNORMAL HIGH (ref 0.61–1.24)
GFR calc Af Amer: 19 mL/min — ABNORMAL LOW (ref >60)
GFR calc non Af Amer: 16 mL/min — ABNORMAL LOW (ref >60)
Glucose, Bld: 158 mg/dL — ABNORMAL HIGH (ref 70–99)
Potassium: 4.7 mmol/L (ref 3.5–5.1)
Sodium: 131 mmol/L — ABNORMAL LOW (ref 135–145)

## 2018-09-28 LAB — CULTURE, BLOOD (ROUTINE X 2)
Culture: NO GROWTH
Special Requests: ADEQUATE

## 2018-09-28 LAB — TACROLIMUS LEVEL: Tacrolimus (FK506) - LabCorp: 40.1 ng/mL — ABNORMAL HIGH (ref 2.0–20.0)

## 2018-09-28 LAB — PROTEIN / CREATININE RATIO, URINE
Creatinine, Urine: 76.72 mg/dL
Protein Creatinine Ratio: 1.33 mg/mg{Cre} — ABNORMAL HIGH (ref 0.00–0.15)
Total Protein, Urine: 102 mg/dL

## 2018-09-28 LAB — PHOSPHORUS: Phosphorus: 3.8 mg/dL (ref 2.5–4.6)

## 2018-09-28 MED ORDER — TACROLIMUS 0.5 MG PO CAPS
0.5000 mg | ORAL_CAPSULE | Freq: Two times a day (BID) | ORAL | Status: DC
  Administered 2018-09-28: 0.5 mg via ORAL
  Filled 2018-09-28 (×4): qty 1

## 2018-09-28 NOTE — Progress Notes (Signed)
[facility] PROGRESS NOTE Subjective Patient with severe ileus, C. difficile, had some vancomycin enemas.  He has strep bacteremia has been on p.o. vancomycin now enemas as well as IV Flagyl and cephalosporin for bacteremia.SARS is negative.  He has had recent EGD at [facility] with ulcerations in the esophagus.  He is due to be trans-for later today due to his renal transplant to the [facility] transplant service.  Objective: Vital signs in last 24 hours: Temp:  [97.5 F (36.4 C)-98.6 F (37 C)] 97.5 F (36.4 C) (06/27 0518) Pulse Rate:  [80-109] 105 (06/27 0518) Resp:  [20-22] 20 (06/26 2100) BP: (114-129)/(70-99) 128/81 (06/27 0518) SpO2:  [95 %-100 %] 99 % (06/27 0518) Weight:  [57.3 kg] 57.3 kg (06/27 0518) Last BM Date: 09/25/18  Intake/Output from previous day: 06/26 0701 - 06/27 0700 In: 240 [P.O.:240] Out: 650 [Urine:650] Intake/Output this shift: No intake/output data recorded.  PE: General--patient laying in bed says he feels terrible  Abdomen--silent and tender about the same as yesterday said.  Lab Results: Recent Labs    09/26/18 0229 09/26/18 0751 09/27/18 0931 09/28/18 0237  WBC 1.6* 1.3* 1.3* 2.2*  HGB 11.6* 12.4* 11.6* 12.4*  HCT 32.5* 34.9* 31.7* 35.1*  PLT 175 156 103* 86*   BMET Recent Labs    09/26/18 0229 09/27/18 0931 09/27/18 1444 09/28/18 0237  NA 133* 134* 132* 131*  K 3.7 3.4* 4.1 4.7  CL 106 88* 98 99  CO2 17* 35* 20* 19*  CREATININE 3.11* 3.13* 3.75* 3.73*   LFT No results for input(s): PROT, AST, ALT, ALKPHOS, BILITOT, BILIDIR, IBILI in the last 72 hours. PT/INR No results for input(s): LABPROT, INR in the last 72 hours. PANCREAS No results for input(s): LIPASE in the last 72 hours.       Studies/Results: Ct Abdomen Pelvis Wo Contrast  Result Date: 09/26/2018 CLINICAL DATA:  Abdominal pain with gastroenteritis or colitis suspected. EXAM: CT ABDOMEN AND PELVIS WITHOUT CONTRAST TECHNIQUE: Multidetector CT imaging of the  abdomen and pelvis was performed following the standard protocol without IV contrast. COMPARISON:  CT dated September 24, 2018 FINDINGS: Lower chest: There are small bilateral pleural effusions with adjacent airspace opacifications suggestive of atelectasis, however aspiration or developing pneumonia is not excluded. Hepatobiliary: The liver is unremarkable. There is cholelithiasis without definite evidence of acute cholecystitis. Pancreas: Unremarkable. No pancreatic ductal dilatation or surrounding inflammatory changes. Spleen: Normal in size without focal abnormality. Adrenals/Urinary Tract: The native right kidney is not visualized. The native left kidney is atrophic. 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 There is some hyperdense material within the right renal collecting system. There is contrast within the urinary bladder. There is a small focus of gas within the urinary bladder and pelvic transplant kidney which may be secondary to instrumentation. Stomach/Bowel: Esophagus is dilated. There are postsurgical changes near the GE junction. The stomach is unremarkable. Again identified are findings of severe colitis. Oral contrast is seen to the level of the sigmoid colon. There is no contrast seen in the rectum. There is diffuse rectal wall thickening. The appendix is not reliably identified. Vascular/Lymphatic: Aortic atherosclerosis. No enlarged abdominal or pelvic lymph nodes. Reproductive: Prostate is unremarkable. Other: There is new free fluid in the abdomen, greatest about the liver. No definite pneumatosis or free air. Musculoskeletal: There are healing fractures of the left inferior and superior pubic rami, the bilateral sacral all, the greater trochanter of the right femur, and the S2 vertebral body. IMPRESSION: 1. Examination limited  by lack of IV contrast and lack of intra-abdominal fat. 2. Again noted are findings of severe pancolitis, not substantially improved from prior study. 3. New  small volume free fluid in the abdomen. 4. New bibasilar airspace opacities concerning for atelectasis or aspiration. There are trace bilateral pleural effusions. 5. Dilated fluid-filled esophagus. 6. Cholelithiasis without CT evidence for acute cholecystitis. 7. There are foci of gas within the urinary bladder and right pelvic transplant kidney suggestive of recent instrumentation. 8. Again noted are multiple acute and subacute pelvic fractures as detailed above. Electronically Signed   By: Constance Holster M.D.   On: 09/26/2018 23:53    Medications: I have reviewed the patient's current medications.  Assessment:   Agree with transfer to [facility] due to his multiple problems.  If he does not respond to IV Flagyl, oral and per enema vancomycin he will likely need colectomy.       Nancy Fetter 09/28/2018, 10:18 AM  This note was created using voice recognition software. Minor errors may Have occurred unintentionally.  Pager: [phone] If no answer or after hours call [phone]

## 2018-09-28 NOTE — Progress Notes (Signed)
Was called from [facility] that patient will have a bed there today sometime after 7 pm.

## 2018-09-28 NOTE — Progress Notes (Addendum)
Pt refused the vanc enema though explain the need.  05:31, Pt Okd for the scheduled Vanc enema and tolerated it well. Will monitor

## 2018-09-28 NOTE — Progress Notes (Signed)
INFECTIOUS DISEASE PROGRESS NOTE  ID: Kenneth [name] is a 63 y.o. male with  Principal Problem:   Sepsis ([place]) Active Problems:   HIV disease ([place])   AKI (acute kidney injury) ([place])   SLE (systemic lupus erythematosus) ([facility])   History of renal transplant   Current chronic use of systemic steroids   C. difficile diarrhea   Leucopenia  Subjective: No BM today, has not eaten today.  Minimal appetite.   Abtx:  Anti-infectives (From admission, onward)   Start     Dose/Rate Route Frequency Ordered Stop   09/27/18 1600  vancomycin (VANCOCIN) 500 mg in sodium chloride irrigation 0.9 % 100 mL ENEMA     500 mg Rectal Every 6 hours 09/27/18 1414     09/27/18 0900  sulfamethoxazole-trimethoprim (BACTRIM DS) 800-160 MG per tablet 1 tablet  Status:  Discontinued     1 tablet Oral 3 times weekly 09/26/18 1524 09/28/18 0904   09/26/18 2000  penicillin G potassium 4 Million Units in dextrose 5 % 250 mL IVPB     4 Million Units 250 mL/hr over 60 Minutes Intravenous Every 8 hours 09/26/18 1541     09/26/18 1400  cefTRIAXone (ROCEPHIN) 2 g in sodium chloride 0.9 % 100 mL IVPB  Status:  Discontinued     2 g 200 mL/hr over 30 Minutes Intravenous Every 24 hours 09/26/18 1225 09/26/18 1541   09/25/18 1100  ceFEPIme (MAXIPIME) 2 g in sodium chloride 0.9 % 100 mL IVPB  Status:  Discontinued     2 g 200 mL/hr over 30 Minutes Intravenous Every 24 hours 09/25/18 1007 09/26/18 1225   09/25/18 1030  acyclovir (ZOVIRAX) tablet 200 mg     200 mg Oral 3 times daily 09/25/18 1007     09/25/18 1000  sulfamethoxazole-trimethoprim (BACTRIM DS) 800-160 MG per tablet 1 tablet  Status:  Discontinued     1 tablet Oral Every M-W-F 09/24/18 0045 09/25/18 0930   09/24/18 1900  ceFEPIme (MAXIPIME) 1 g in sodium chloride 0.9 % 100 mL IVPB  Status:  Discontinued     1 g 200 mL/hr over 30 Minutes Intravenous Every 24 hours 09/23/18 1946 09/24/18 0115   09/24/18 1000  hydroxychloroquine (PLAQUENIL) tablet 200 mg     200 mg Oral 2 times daily 09/24/18 0045     09/24/18 1000  abacavir-dolutegravir-lamiVUDine (TRIUMEQ) 600-50-300 MG per tablet 1 tablet     1 tablet Oral Daily 09/24/18 0045     09/24/18 0600  vancomycin (VANCOCIN) 50 mg/mL oral solution 500 mg     500 mg Oral 3 times daily before meals & bedtime 09/24/18 0055 10/08/18 0759   09/24/18 0200  metroNIDAZOLE (FLAGYL) IVPB 500 mg     500 mg 100 mL/hr over 60 Minutes Intravenous Every 8 hours 09/24/18 0055 10/08/18 0159   09/23/18 1900  ceFEPIme (MAXIPIME) 2 g in sodium chloride 0.9 % 100 mL IVPB     2 g 200 mL/hr over 30 Minutes Intravenous  Once 09/23/18 1856 09/23/18 2011   09/23/18 1900  vancomycin (VANCOCIN) IVPB 1000 mg/200 mL premix     1,000 mg 200 mL/hr over 60 Minutes Intravenous  Once 09/23/18 1856 09/23/18 2144      Medications:  Scheduled: . abacavir-dolutegravir-lamiVUDine  1 tablet Oral Daily  . acyclovir  200 mg Oral TID  . aspirin  81 mg Oral Daily  . cholecalciferol  2,000 Units Oral Daily  . feeding supplement (ENSURE ENLIVE)  237 mL Oral TID BM  .  hydroxychloroquine  200 mg Oral BID  . magic mouthwash  5 mL Oral TID  . multivitamin with minerals  1 tablet Oral Daily  . nystatin  5 mL Oral QID  . predniSONE  10 mg Oral Daily  . tacrolimus  0.5 mg Oral BID  . tamsulosin  0.4 mg Oral QHS  . vancomycin  500 mg Oral TID AC & HS  . vancomycin (VANCOCIN) rectal ENEMA  500 mg Rectal Q6H    Objective: Vital signs in last 24 hours: Temp:  [97.5 F (36.4 C)-98 F (36.7 C)] 97.5 F (36.4 C) (06/27 0518) Pulse Rate:  [80-109] 105 (06/27 0518) Resp:  [20-22] 20 (06/26 2100) BP: (114-129)/(75-99) 128/81 (06/27 0518) SpO2:  [95 %-100 %] 99 % (06/27 0518) Weight:  [57.3 kg] 57.3 kg (06/27 0518)   General appearance: alert, cooperative and no distress Throat: lips, mucosa, and tongue normal; teeth and gums normal Resp: clear to auscultation bilaterally Cardio: regularly irregular rhythm GI: normal findings: bowel  sounds normal and soft, non-tender Extremities: edema none and +bruit LUE  Lab Results Recent Labs    09/27/18 0931 09/27/18 1444 09/28/18 0237  WBC 1.3*  --  2.2*  HGB 11.6*  --  12.4*  HCT 31.7*  --  35.1*  NA 134* 132* 131*  K 3.4* 4.1 4.7  CL 88* 98 99  CO2 35* 20* 19*  BUN 82* 92* 96*  CREATININE 3.13* 3.75* 3.73*   Liver Panel Recent Labs    09/27/18 0931  ALBUMIN 1.5*   Sedimentation Rate No results for input(s): ESRSEDRATE in the last 72 hours. C-Reactive Protein No results for input(s): CRP in the last 72 hours.  Microbiology: Recent Results (from the past 240 hour(s))  Blood Culture (routine x 2)     Status: Abnormal   Collection Time: 09/23/18  6:54 PM   Specimen: BLOOD  Result Value Ref Range Status   Specimen Description BLOOD SITE NOT SPECIFIED  Final   Special Requests   Final    BOTTLES DRAWN AEROBIC AND ANAEROBIC Blood Culture adequate volume   Culture  Setup Time   Final    GRAM POSITIVE COCCI IN PAIRS AND CHAINS ANAEROBIC BOTTLE ONLY CRITICAL RESULT CALLED TO, READ BACK BY AND VERIFIED WITH: A. Love PharmD 14:20 09/24/18 ([email or web address]) Performed at [facility], [place] [address], [place], [place] [address]    Culture STREPTOCOCCUS ANGINOSIS (A)  Final   Report Status 09/26/2018 FINAL  Final   Organism ID, Bacteria STREPTOCOCCUS ANGINOSIS  Final      Susceptibility   Streptococcus anginosis - MIC*    PENICILLIN <=0.06 SENSITIVE Sensitive     CEFTRIAXONE 0.5 SENSITIVE Sensitive     ERYTHROMYCIN <=0.12 SENSITIVE Sensitive     LEVOFLOXACIN 0.5 SENSITIVE Sensitive     VANCOMYCIN 1 SENSITIVE Sensitive     * STREPTOCOCCUS ANGINOSIS  Blood Culture (routine x 2)     Status: None   Collection Time: 09/23/18  6:54 PM   Specimen: Site Not Specified; Blood  Result Value Ref Range Status   Specimen Description SITE NOT SPECIFIED  Final   Special Requests   Final    BOTTLES DRAWN AEROBIC ONLY Blood Culture adequate volume   Culture   Final    NO  GROWTH 5 DAYS Performed at [facility], [place] [address] [place], [place] [address]    Report Status 09/28/2018 FINAL  Final  Blood Culture ID Panel (Reflexed)     Status: Abnormal  Collection Time: 09/23/18  6:54 PM  Result Value Ref Range Status   Enterococcus species NOT DETECTED NOT DETECTED Final   Listeria monocytogenes NOT DETECTED NOT DETECTED Final   Staphylococcus species NOT DETECTED NOT DETECTED Final   Staphylococcus aureus (BCID) NOT DETECTED NOT DETECTED Final   Streptococcus species DETECTED (A) NOT DETECTED Final    Comment: Not Enterococcus species, Streptococcus agalactiae, Streptococcus pyogenes, or Streptococcus pneumoniae. CRITICAL RESULT CALLED TO, READ BACK BY AND VERIFIED WITH: A. Love PharmD 14:20 09/24/18 ([email or web address])    Streptococcus agalactiae NOT DETECTED NOT DETECTED Final   Streptococcus pneumoniae NOT DETECTED NOT DETECTED Final   Streptococcus pyogenes NOT DETECTED NOT DETECTED Final   Acinetobacter baumannii NOT DETECTED NOT DETECTED Final   Enterobacteriaceae species NOT DETECTED NOT DETECTED Final   Enterobacter cloacae complex NOT DETECTED NOT DETECTED Final   Escherichia coli NOT DETECTED NOT DETECTED Final   Klebsiella oxytoca NOT DETECTED NOT DETECTED Final   Klebsiella pneumoniae NOT DETECTED NOT DETECTED Final   Proteus species NOT DETECTED NOT DETECTED Final   Serratia marcescens NOT DETECTED NOT DETECTED Final   Haemophilus influenzae NOT DETECTED NOT DETECTED Final   Neisseria meningitidis NOT DETECTED NOT DETECTED Final   Pseudomonas aeruginosa NOT DETECTED NOT DETECTED Final   Candida albicans NOT DETECTED NOT DETECTED Final   Candida glabrata NOT DETECTED NOT DETECTED Final   Candida krusei NOT DETECTED NOT DETECTED Final   Candida parapsilosis NOT DETECTED NOT DETECTED Final   Candida tropicalis NOT DETECTED NOT DETECTED Final    Comment: Performed at [facility], [place] [address] [place], [place] [address]  SARS  Coronavirus 2 (CEPHEID - Performed in [facility] lab), Hosp Order     Status: None   Collection Time: 09/23/18  7:04 PM   Specimen: Nasopharyngeal Swab  Result Value Ref Range Status   SARS Coronavirus 2 NEGATIVE NEGATIVE Final    Comment: (NOTE) If result is NEGATIVE SARS-CoV-2 target nucleic acids are NOT DETECTED. The SARS-CoV-2 RNA is generally detectable in upper and lower  respiratory specimens during the acute phase of infection. The lowest  concentration of SARS-CoV-2 viral copies this assay can detect is 250  copies / mL. A negative result does not preclude SARS-CoV-2 infection  and should not be used as the sole basis for treatment or other  patient management decisions.  A negative result may occur with  improper specimen collection / handling, submission of specimen other  than nasopharyngeal swab, presence of viral mutation(s) within the  areas targeted by this assay, and inadequate number of viral copies  (<250 copies / mL). A negative result must be combined with clinical  observations, patient history, and epidemiological information. If result is POSITIVE SARS-CoV-2 target nucleic acids are DETECTED. The SARS-CoV-2 RNA is generally detectable in upper and lower  respiratory specimens dur ing the acute phase of infection.  Positive  results are indicative of active infection with SARS-CoV-2.  Clinical  correlation with patient history and other diagnostic information is  necessary to determine patient infection status.  Positive results do  not rule out bacterial infection or co-infection with other viruses. If result is PRESUMPTIVE POSTIVE SARS-CoV-2 nucleic acids MAY BE PRESENT.   A presumptive positive result was obtained on the submitted specimen  and confirmed on repeat testing.  While 2019 novel coronavirus  (SARS-CoV-2) nucleic acids may be present in the submitted sample  additional confirmatory testing may be necessary for epidemiological  and / or  clinical management  purposes  to differentiate between  SARS-CoV-2 and other Sarbecovirus currently known to infect humans.  If clinically indicated additional testing with an alternate test  methodology [phone]) is advised. The SARS-CoV-2 RNA is generally  detectable in upper and lower respiratory sp ecimens during the acute  phase of infection. The expected result is Negative. Fact Sheet for Patients:  StrictlyIdeas.no Fact Sheet for Healthcare Providers: BankingDealers.co.za This test is not yet approved or cleared by the [place] FDA and has been authorized for detection and/or diagnosis of SARS-CoV-2 by FDA under an Emergency Use Authorization (EUA).  This EUA will remain in effect (meaning this test can be used) for the duration of the COVID-19 declaration under Section 564(b)(1) of the Act, 21 U.S.C. section 360bbb-3(b)(1), unless the authorization is terminated or revoked sooner. Performed at [facility], [place] [address] [place], [place] [address]   Urine culture     Status: None   Collection Time: 09/23/18  8:35 PM   Specimen: Urine, Catheterized  Result Value Ref Range Status   Specimen Description URINE, CATHETERIZED  Final   Special Requests NONE  Final   Culture   Final    NO GROWTH Performed at [facility], [place] [address] [place], [place] [address]    Report Status 09/24/2018 FINAL  Final  C difficile quick scan w PCR reflex     Status: Abnormal   Collection Time: 09/23/18  8:35 PM   Specimen: Urine, Catheterized; Stool  Result Value Ref Range Status   C Diff antigen POSITIVE (A) NEGATIVE Final   C Diff toxin POSITIVE (A) NEGATIVE Final   C Diff interpretation Toxin producing C. difficile detected.  Final    Comment: CRITICAL RESULT CALLED TO, READ BACK BY AND VERIFIED WITH: A DENNIS RN 09/24/18 0015 JDW Performed at [facility], [address] [place], [place] [address]    Gastrointestinal Panel by PCR , Stool     Status: None   Collection Time: 09/23/18  8:35 PM   Specimen: Stool  Result Value Ref Range Status   Campylobacter species NOT DETECTED NOT DETECTED Final   Plesimonas shigelloides NOT DETECTED NOT DETECTED Final   Salmonella species NOT DETECTED NOT DETECTED Final   Yersinia enterocolitica NOT DETECTED NOT DETECTED Final   Vibrio species NOT DETECTED NOT DETECTED Final   Vibrio cholerae NOT DETECTED NOT DETECTED Final   Enteroaggregative E coli (EAEC) NOT DETECTED NOT DETECTED Final   Enteropathogenic E coli (EPEC) NOT DETECTED NOT DETECTED Final   Enterotoxigenic E coli (ETEC) NOT DETECTED NOT DETECTED Final   Shiga like toxin producing E coli (STEC) NOT DETECTED NOT DETECTED Final   Shigella/Enteroinvasive E coli (EIEC) NOT DETECTED NOT DETECTED Final   Cryptosporidium NOT DETECTED NOT DETECTED Final   Cyclospora cayetanensis NOT DETECTED NOT DETECTED Final   Entamoeba histolytica NOT DETECTED NOT DETECTED Final   Giardia lamblia NOT DETECTED NOT DETECTED Final   Adenovirus F40/41 NOT DETECTED NOT DETECTED Final   Astrovirus NOT DETECTED NOT DETECTED Final   Norovirus GI/GII NOT DETECTED NOT DETECTED Final   Rotavirus A NOT DETECTED NOT DETECTED Final   Sapovirus (I, II, IV, and V) NOT DETECTED NOT DETECTED Final    Comment: Performed at [facility], [place] [place], [place] [address]  CSF culture     Status: None   Collection Time: 09/23/18 10:12 PM   Specimen: CSF; Cerebrospinal Fluid  Result Value Ref Range Status   Specimen Description  CSF  Final   Special Requests NONE  Final   Gram Stain NO WBC SEEN NO ORGANISMS SEEN CYTOSPIN SMEAR   Final   Culture   Final    NO GROWTH 3 DAYS Performed at [facility], [address], [place], [place] [address]    Report Status 09/27/2018 FINAL  Final  Culture, blood (routine x 2)     Status: None (Preliminary result)   Collection Time: 09/26/18 12:12 PM    Specimen: BLOOD RIGHT HAND  Result Value Ref Range Status   Specimen Description BLOOD RIGHT HAND  Final   Special Requests   Final    BOTTLES DRAWN AEROBIC ONLY Blood Culture adequate volume   Culture   Final    NO GROWTH 2 DAYS Performed at [facility], [place] [address], [place], [place] [address]    Report Status PENDING  Incomplete  Culture, blood (routine x 2)     Status: None (Preliminary result)   Collection Time: 09/26/18 12:25 PM   Specimen: BLOOD RIGHT HAND  Result Value Ref Range Status   Specimen Description BLOOD RIGHT HAND  Final   Special Requests   Final    BOTTLES DRAWN AEROBIC ONLY Blood Culture adequate volume   Culture   Final    NO GROWTH 2 DAYS Performed at [facility], [place] [address], [place], [place] [address]    Report Status PENDING  Incomplete    Studies/Results: Ct Abdomen Pelvis Wo Contrast  Result Date: 09/26/2018 CLINICAL DATA:  Abdominal pain with gastroenteritis or colitis suspected. EXAM: CT ABDOMEN AND PELVIS WITHOUT CONTRAST TECHNIQUE: Multidetector CT imaging of the abdomen and pelvis was performed following the standard protocol without IV contrast. COMPARISON:  CT dated September 24, 2018 FINDINGS: Lower chest: There are small bilateral pleural effusions with adjacent airspace opacifications suggestive of atelectasis, however aspiration or developing pneumonia is not excluded. Hepatobiliary: The liver is unremarkable. There is cholelithiasis without definite evidence of acute cholecystitis. Pancreas: Unremarkable. No pancreatic ductal dilatation or surrounding inflammatory changes. Spleen: Normal in size without focal abnormality. Adrenals/Urinary Tract: The native right kidney is not visualized. The native left kidney is atrophic. 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 There is some hyperdense material within the right renal collecting system. There is contrast within the urinary bladder. There is a small focus of gas within the  urinary bladder and pelvic transplant kidney which may be secondary to instrumentation. Stomach/Bowel: Esophagus is dilated. There are postsurgical changes near the GE junction. The stomach is unremarkable. Again identified are findings of severe colitis. Oral contrast is seen to the level of the sigmoid colon. There is no contrast seen in the rectum. There is diffuse rectal wall thickening. The appendix is not reliably identified. Vascular/Lymphatic: Aortic atherosclerosis. No enlarged abdominal or pelvic lymph nodes. Reproductive: Prostate is unremarkable. Other: There is new free fluid in the abdomen, greatest about the liver. No definite pneumatosis or free air. Musculoskeletal: There are healing fractures of the left inferior and superior pubic rami, the bilateral sacral all, the greater trochanter of the right femur, and the S2 vertebral body. IMPRESSION: 1. Examination limited by lack of IV contrast and lack of intra-abdominal fat. 2. Again noted are findings of severe pancolitis, not substantially improved from prior study. 3. New small volume free fluid in the abdomen. 4. New bibasilar airspace opacities concerning for atelectasis or aspiration. There are trace bilateral pleural effusions. 5. Dilated fluid-filled esophagus. 6. Cholelithiasis without CT  evidence for acute cholecystitis. 7. There are foci of gas within the urinary bladder and right pelvic transplant kidney suggestive of recent instrumentation. 8. Again noted are multiple acute and subacute pelvic fractures as detailed above. Electronically Signed   By: Constance Holster M.D.   On: 09/26/2018 23:53   HIV 1 RNA Quant  Date Value  03/07/2018 <20 NOT DETECTED copies/mL  08/23/2017 <20 NOT DETECTED copies/mL  07/18/2017 78 Copies/mL (H)   CD4 T Cell Abs (/uL)  Date Value  09/24/2018 45 (L)  03/07/2018 260 (L)  07/18/2017 590     Assessment/Plan: AIDS ESRD, renal txp 04-2018  ARF C diff, pancolitis Group A strep bacteremia  (6-22), repeat BCx 6-25 ngtd  Total days of antibiotics: 4 (Pen G 2) 1 vanco enema; IV flagyl/po vanco triumeq Prednisone 10mg /tacrolimus/plaquenil      Cr stable last 24h but significantly up over last 4 days Lactic acid better WBC also better, PLT stable To transfer to [facility] today for renal txp svc Hopefully Cr on the way down Stop Pen G at day 7    Bobby Rumpf MD, FACP Infectious Diseases (pager) [phone] [email or web address] 09/28/2018, 12:48 PM  LOS: 4 days

## 2018-09-28 NOTE — Progress Notes (Signed)
[facility] Progress Note  Name: Kenneth [name] MRN: [id number] DOB: 12-16-1955  Chief Complaint:  Diarrhea   Subjective:  [name] hasn't yet had a bed but anticipates a bed later today.  Prograf was held AM and PM on 6/26 as well as this AM.  He has been on normal saline at 100 ml/hr.  Had 650 mL UOP charted over 6/26.  On chart review received potassium yesterday.  GI was consulted yesterday and recommended surgical consult if worsening.  He reports abdominal pain is a 5-6/10 today - maybe a little better than yesterday.  Feels more distention  Review of systems:   + abdominal pain  No bowel movement and not passing gas Having nausea without emesis Denies shortness of breath   --------------- Background on consult:  Kenneth [name] is a 63 y.o. male with a history of ESRD s/p renal transplant at [facility] 04/13/2018 as well as HIV who presented with diarrhea and decreased PO intake.  He was found to have C dif colitis.  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  Note Cr 1.1 on 09/05/18.   He was found to be c dif positive and strep bacteremia.  He has been on PO vanc and IV flagyl and cefepime for the bacteremia.  He has also received normal saline.  Creatinine has improved from 3.51 to 2.99 on consult.  Note SARS coronavirus 2 negative.  He had a recent endoscopy at [facility] which demonstrated ulcerations in the mid and distal esophagus per charting.  No pain over transplant.  Lives at a facility and reports compliance with meds.  Appears a FK506 level drawn earlier today may not be a trough.  He has a foley and this is about to be changed to a condom cath per primary team.  Abdominal CT with punctate stone in the region of the transplant hilum. Mild fullness of the renal transplant collecting system. No peritransplant fluid collection. The urinary bladder is decompressed around a Foley catheter and contains a small pocket of air.  We  discussed his renal function.  If his renal function declines to the point that he would need it, he would want hemodialysis.  Spoke with transplant neph at [facility].  Hold MMF for now.  Will reassess restarting in 1-2 days once diarrhea improved.     Intake/Output Summary (Last 24 hours) at 09/28/2018 0827 Last data filed at 09/28/2018 0510 Gross per 24 hour  Intake 240 ml  Output 650 ml  Net -410 ml    Vitals:  Vitals:   09/27/18 1647 09/27/18 2017 09/27/18 2100 09/28/18 0518  BP: 114/75 (!) 129/99  128/81  Pulse: 80 (!) 109  (!) 105  Resp:  (!) 22 20   Temp: 98 F (36.7 C) 97.7 F (36.5 C)  (!) 97.5 F (36.4 C)  TempSrc: Oral Oral  Oral  SpO2: 95% 100%  99%  Weight:    57.3 kg  Height:         Physical Exam:   General thin adult male in bed uncomfortable  HEENT dry mm; normocephalic atraumatic extraocular movements intact sclera anicteric Neck supple trachea midline Lungs clear to auscultation bilaterally  Heart tachycardia, S1S2 no rub Abdomen thin and distended; abdomen is tender with reduced bowel sounds  Extremities no lower extremity edema  Psych normal mood and affect GU - condom cath  RLQ allograft   Medications reviewed   Labs:  BMP  Latest Ref Rng & Units 09/28/2018 09/27/2018 09/27/2018  Glucose 70 - 99 mg/dL 158(H) 147(H) 426(H)  BUN 8 - 23 mg/dL 96(H) 92(H) 82(H)  Creatinine 0.61 - 1.24 mg/dL 3.73(H) 3.75(H) 3.13(H)  BUN/Creat Ratio 6 - 22 (calc) - - -  Sodium 135 - 145 mmol/L 131(L) 132(L) 134(L)  Potassium 3.5 - 5.1 mmol/L 4.7 4.1 3.4(L)  Chloride 98 - 111 mmol/L 99 98 88(L)  CO2 22 - 32 mmol/L 19(L) 20(L) 35(H)  Calcium 8.9 - 10.3 mg/dL 6.7(L) 6.7(L) 5.6(LL)     Assessment/Plan:   # AKI  - Pre-renal insults in the setting of severe cdif colitis and sepsis and also with supratherapeutic prograf level (though not a trough) - Hopeful for improvement with temporarily holding the prograf but difficult to titrate dose as a send-out lab and haven't been  able to get actual troughs - Place foley   - Reduce IV fluids  - up/cr ratio is ordered but still not collected.  Spoke with nurse  - Strict ins/outs - He has no emergent indication for HD today but he is progressing toward the need for dialysis     # Metabolic acidosis - GI losses as well as AKI.  Lactic acid level 1.5 from 2.6 - s/p bicarb infusion  # Severe c difficile colitis with abdominal pain - on PO vanc as well as IV flagyl - Severe colitis on CT  - Primary team is following closely.  GI consulted as well   # ESRD s/p renal transplant  - Immunosuppression as below.  Cr 1.1 on 09/05/18 - Transplant ultrasound with no evidence of hydro and possible nonobstructing 7 mm stone in the upper pole of the transplant kidney.  # Streptococcus bacteremia - on penicillin per ID   # Immunosuppression  - Normally on cellcept 250 mg BID and prograf 1.5 mg BID and prednisone 10 mg daily  - I have contacted transplant team at [facility].  For now, we have discontinued cellcept.  Leukopenia improving  - Held this AM's prograf dose given the FK506 level was 41.9.  (Markedly elevated.  Level obtained at 9:22 am on 6/24 after an 8:41 am dose.  There is another level - also not an actual trough - of 40.1 drawn at 2:29 am on 6/25) - Titration of immunsuppression is hindered by the delay in return of results    - Continue prednisone for now   # Hypocalcemia - repleted; corrected calcium 8.7 - ionized calcium from yesterday is still in process...  # Hematuria  - Resolved on repeat UA  # SLE  - on hydroxychloroquine; off cellcept for now as above   # HIV  - Per transplant ID would continue current regimen   # Oral ulcers - Per primary team the PO acyclovir was started for oral ulcers  Given lack of improvement and recent transplant status I have spoken with [name] - Dr. Aquilla Hacker and he has agreed that the patient would be best served at their transplant center.  His primary team has been  talking with the transfer center and they are waiting on a bed at [facility], MD 09/28/2018 8:27 AM

## 2018-09-28 NOTE — Plan of Care (Signed)
  Problem: Safety: Goal: Ability to remain free from injury will improve Outcome: Progressing   Problem: Coping: Goal: Level of anxiety will decrease Outcome: Progressing   

## 2018-09-28 NOTE — Progress Notes (Addendum)
PROGRESS NOTE                                                                                                                                                                                                             Patient Demographics:    Kenneth [name], is a 63 y.o. male, DOB - March 15, 1956, JKD:[id number]  Admit date - 09/23/2018   Admitting Physician [name], MD  Outpatient Primary MD for the patient is [name], MD  LOS - 4  Outpatient Specialists: Nephrology at [facility] Complaint  Patient presents with   tachycardia   hypotensive       Brief Narrative  63 year old male with history of HIV AIDS with CD4 of 25, hx of ESRD(no longer active) secondary to SLE nephritis status post renal transplant January this year with improved baseline creatinine 1.1 presented from SNF with abdominal pain, poor p.o. intake and 4-day history of diarrhea.  Patient was severely septic with fever, hypotension and acute kidney injury on presentation.  He was tested positive for C. difficile.  He even required vasopressors but quickly improved with 2 L IV fluid bolus.  CT of the abdomen pelvis showed pancolitis and several new fractures involving greater trochanter of the right femur, sacral fracture and L5 fracture (patient has had sacral fracture when he was hospitalized in May and orthopedics recommended weightbearing and nonsurgical intervention). Patient admitted to stepdown for severe sepsis and ATN secondary to C. difficile pancolitis.   Subjective:   Complains of abdominal discomfort, has not had any bowel movement.  Receiving bank enema.  Afebrile.  Still tachycardic in low 100s on the monitor.  No nausea or vomiting.  Assessment  & Plan :    Principal Problem:   Severe sepsis ([place]) Secondary to C. difficile pancolitis.  Now concern for ileus with worsening symptoms.  Continue IV Flagyl.  Switch to vancomycin  enema on 6/26.  ID and GI following and appreciate recommendation.  Repeat CT done on 6/25 (with oral contrast only) again shows severe pancolitis, unchanged from admission with new small volume free fluid in the abdomen and bibasilar atelectasis.  No megacolon or abscess noted.  Lactic acid normal. Low-dose IV Dilaudid for pain.  Keep n.p.o.  Strep bacteremia Blood culture 1/3 growing strep anginosis.  [name] placed him on IV penicillin.  Repeat blood cultures without growth.  Active Problems:  Acute tubular necrosis (HCC) Suspect prerenal with severe sepsis/C. difficile colitis.  No improvement with fluid resuscitation and continues to further worsen.  Foley placed in today and monitor strict I's/O.  Renal following.  Discontinue Bactrim and avoid nephrotoxic agents. Highly concerning for progressing to dialysis.  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  Continue Prograf.  Holding CellCept and Plaquenil.  [name] communicating with transplant surgeon at [facility].   Right transverse process L5 fracture along with sacral fractures MRI done shows acute to subacute bilateral sacral alar fracture stable from prior CT.  New acute or subacute nondisplaced fracture of the bilateral transfer process of L5, mild diffuse soft tissue edema of the lower lumbar and sacral spine.  Central disc protrusion at L4-L5 with mild bilateral lateral recess stenosis. Orthopedics consult recommends nonoperative management with weightbearing as tolerated.  Check vitamin D level and placed on supplement if low.  Follow-up in office in 2 weeks to undergo full bone metabolism work-up.    HIV AIDS. CD4 of 45.  Likely due to sepsis.  Was 266 months ago.  Reports that he is now following at [facility].  Patient states he has been adherent to his ART.  [name] appreciated.  Hold Bactrim given worsened renal function.  Acute metabolic  encephalopathy (HCC) Suspect secondary to severe sepsis.  Underwent LP which was unremarkable.  Mental status currently at baseline.    Pancytopenia Has leukopenia and now thrombocytopenia secondary to sepsis.  Monitor closely.   Hypokalemia/hypocalcemia Replenished.  Will hold off on further replenishment given worsened renal function.    Current chronic use of systemic steroids Was placed on IV hydrocortisone for sepsis, resume back to prednisone.  Oral ulcers Continue Magic mouthwash.  Placed on acyclovir with improvement.  History of mid and lower esophageal ulceration (as per endoscopy done at [facility] done on 6/17). On nystatin.  Surgical pathology not available on care everywhere.  Severe protein calorie malnutrition ([place]) Physical deconditioning PT recommends SNF.  Nutrition consulted.   Disposition: Given no improvement of his renal function despite fluid resuscitation and being a transplant recipient, transplant surgeon at [facility] has been consulted by Dr. [name] and he has been accepted there.  Since they are in diversion patient still waiting for bed.   Code Status : Full code, condition guarded  Family Communication  : None at bedside  Disposition Plan  : Awaiting transfer to [facility].  Barriers For Discharge : Active symptoms  Consults  : Nephrology  Procedures  : CT abdomen, MRI lumbar spine  DVT Prophylaxis  : SCDs.  Heparin discontinued due to thrombocytopenia  Lab Results  Component Value Date   PLT 86 (L) 09/28/2018    Antibiotics  :    Anti-infectives (From admission, onward)   Start     Dose/Rate Route Frequency Ordered Stop   09/27/18 1600  vancomycin (VANCOCIN) 500 mg in sodium chloride irrigation 0.9 % 100 mL ENEMA     500 mg Rectal Every 6 hours 09/27/18 1414     09/27/18 0900  sulfamethoxazole-trimethoprim (BACTRIM DS) 800-160 MG per tablet 1 tablet  Status:  Discontinued     1 tablet Oral 3 times weekly 09/26/18 1524 09/28/18 0904    09/26/18 2000  penicillin G potassium 4 Million Units in dextrose 5 % 250 mL IVPB     4 Million Units 250 mL/hr over 60 Minutes Intravenous  Every 8 hours 09/26/18 1541     09/26/18 1400  cefTRIAXone (ROCEPHIN) 2 g in sodium chloride 0.9 % 100 mL IVPB  Status:  Discontinued     2 g 200 mL/hr over 30 Minutes Intravenous Every 24 hours 09/26/18 1225 09/26/18 1541   09/25/18 1100  ceFEPIme (MAXIPIME) 2 g in sodium chloride 0.9 % 100 mL IVPB  Status:  Discontinued     2 g 200 mL/hr over 30 Minutes Intravenous Every 24 hours 09/25/18 1007 09/26/18 1225   09/25/18 1030  acyclovir (ZOVIRAX) tablet 200 mg     200 mg Oral 3 times daily 09/25/18 1007     09/25/18 1000  sulfamethoxazole-trimethoprim (BACTRIM DS) 800-160 MG per tablet 1 tablet  Status:  Discontinued     1 tablet Oral Every M-W-F 09/24/18 0045 09/25/18 0930   09/24/18 1900  ceFEPIme (MAXIPIME) 1 g in sodium chloride 0.9 % 100 mL IVPB  Status:  Discontinued     1 g 200 mL/hr over 30 Minutes Intravenous Every 24 hours 09/23/18 1946 09/24/18 0115   09/24/18 1000  hydroxychloroquine (PLAQUENIL) tablet 200 mg     200 mg Oral 2 times daily 09/24/18 0045     09/24/18 1000  abacavir-dolutegravir-lamiVUDine (TRIUMEQ) 600-50-300 MG per tablet 1 tablet     1 tablet Oral Daily 09/24/18 0045     09/24/18 0600  vancomycin (VANCOCIN) 50 mg/mL oral solution 500 mg     500 mg Oral 3 times daily before meals & bedtime 09/24/18 0055 10/08/18 0759   09/24/18 0200  metroNIDAZOLE (FLAGYL) IVPB 500 mg     500 mg 100 mL/hr over 60 Minutes Intravenous Every 8 hours 09/24/18 0055 10/08/18 0159   09/23/18 1900  ceFEPIme (MAXIPIME) 2 g in sodium chloride 0.9 % 100 mL IVPB     2 g 200 mL/hr over 30 Minutes Intravenous  Once 09/23/18 1856 09/23/18 2011   09/23/18 1900  vancomycin (VANCOCIN) IVPB 1000 mg/200 mL premix     1,000 mg 200 mL/hr over 60 Minutes Intravenous  Once 09/23/18 1856 09/23/18 2144        Objective:   Vitals:   09/27/18 1647 09/27/18  2017 09/27/18 2100 09/28/18 0518  BP: 114/75 (!) 129/99  128/81  Pulse: 80 (!) 109  (!) 105  Resp:  (!) 22 20   Temp: 98 F (36.7 C) 97.7 F (36.5 C)  (!) 97.5 F (36.4 C)  TempSrc: Oral Oral  Oral  SpO2: 95% 100%  99%  Weight:    57.3 kg  Height:        Wt Readings from Last 3 Encounters:  09/28/18 57.3 kg  09/20/18 53.4 kg  09/19/18 53.4 kg     Intake/Output Summary (Last 24 hours) at 09/28/2018 1245 Last data filed at 09/28/2018 0900 Gross per 24 hour  Intake 0 ml  Output 500 ml  Net -500 ml   Physical exam In some distress with pain and restless HEENT: Dry mucosa, supple neck Chest: Clear bilaterally CVs: S1-S2 tachycardic GI: Soft, distended, tender, sluggish bowel sounds, Foley + with dark urine Musculoskeletal: Warm, no edema,    Data Review:    CBC Recent Labs  Lab 09/23/18 1854  09/25/18 0726 09/26/18 0229 09/26/18 0751 09/27/18 0931 09/28/18 0237  WBC 2.6*   < > 2.9* 1.6* 1.3* 1.3* 2.2*  HGB 15.2   < > 12.9* 11.6* 12.4* 11.6* 12.4*  HCT 44.8   < > 36.8* 32.5* 34.9* 31.7* 35.1*  PLT 273   < >  199 175 156 103* 86*  MCV 92.4   < > 88.7 86.4 86.4 85.9 87.3  MCH 31.3   < > 31.1 30.9 30.7 31.4 30.8  MCHC 33.9   < > 35.1 35.7 35.5 36.6* 35.3  RDW 13.6   < > 13.7 13.6 13.5 13.5 13.9  LYMPHSABS 0.8  --   --   --  0.0*  --  0.2*  MONOABS 0.3  --   --   --  0.4  --  0.1  EOSABS 0.0  --   --   --  0.0  --  0.0  BASOSABS 0.0  --   --   --  0.0  --  0.0   < > = values in this interval not displayed.    Chemistries  Recent Labs  Lab 09/23/18 1854  09/25/18 0726 09/26/18 0229 09/27/18 0931 09/27/18 1444 09/28/18 0237  NA 133*   < > 133* 133* 134* 132* 131*  K 4.5   < > 4.3 3.7 3.4* 4.1 4.7  CL 101   < > 109 106 88* 98 99  CO2 16*   < > 13* 17* 35* 20* 19*  GLUCOSE 141*   < > 109* 213* 426* 147* 158*  BUN 74*   < > 91* 94* 82* 92* 96*  CREATININE 3.68*   < > 2.99* 3.11* 3.13* 3.75* 3.73*  CALCIUM 8.4*   < > 6.7* 6.5* 5.6* 6.7* 6.7*  MG 2.7*  --    --   --  2.2  --   --   AST 25  --   --   --   --   --   --   ALT 13  --   --   --   --   --   --   ALKPHOS 90  --   --   --   --   --   --   BILITOT 0.6  --   --   --   --   --   --    < > = values in this interval not displayed.   ------------------------------------------------------------------------------------------------------------------ No results for input(s): CHOL, HDL, LDLCALC, TRIG, CHOLHDL, LDLDIRECT in the last 72 hours.  No results found for: HGBA1C ------------------------------------------------------------------------------------------------------------------ No results for input(s): TSH, T4TOTAL, T3FREE, THYROIDAB in the last 72 hours.  Invalid input(s): FREET3 ------------------------------------------------------------------------------------------------------------------ No results for input(s): VITAMINB12, FOLATE, FERRITIN, TIBC, IRON, RETICCTPCT in the last 72 hours.  Coagulation profile No results for input(s): INR, PROTIME in the last 168 hours.  No results for input(s): DDIMER in the last 72 hours.  Cardiac Enzymes No results for input(s): CKMB, TROPONINI, MYOGLOBIN in the last 168 hours.  Invalid input(s): CK ------------------------------------------------------------------------------------------------------------------    Component Value Date/Time   BNP 176.0 (H) 09/23/2018 1824    Inpatient Medications  Scheduled Meds:  abacavir-dolutegravir-lamiVUDine  1 tablet Oral Daily   acyclovir  200 mg Oral TID   aspirin  81 mg Oral Daily   cholecalciferol  2,000 Units Oral Daily   feeding supplement (ENSURE ENLIVE)  237 mL Oral TID BM   heparin  5,000 Units Subcutaneous Q8H   hydroxychloroquine  200 mg Oral BID   magic mouthwash  5 mL Oral TID   multivitamin with minerals  1 tablet Oral Daily   nystatin  5 mL Oral QID   predniSONE  10 mg Oral Daily   tacrolimus  0.5 mg Oral BID   tamsulosin  0.4 mg Oral QHS  vancomycin  500 mg  Oral TID AC & HS   vancomycin (VANCOCIN) rectal ENEMA  500 mg Rectal Q6H   Continuous Infusions:  sodium chloride 1,000 mL (09/26/18 0107)   sodium chloride 50 mL/hr at 09/28/18 1031   metronidazole 500 mg (09/28/18 0839)   pencillin G potassium IV 4 Million Units (09/28/18 0510)   PRN Meds:.sodium chloride, acetaminophen **OR** acetaminophen, HYDROmorphone (DILAUDID) injection, ondansetron **OR** ondansetron (ZOFRAN) IV  Micro Results Recent Results (from the past 240 hour(s))  Blood Culture (routine x 2)     Status: Abnormal   Collection Time: 09/23/18  6:54 PM   Specimen: BLOOD  Result Value Ref Range Status   Specimen Description BLOOD SITE NOT SPECIFIED  Final   Special Requests   Final    BOTTLES DRAWN AEROBIC AND ANAEROBIC Blood Culture adequate volume   Culture  Setup Time   Final    GRAM POSITIVE COCCI IN PAIRS AND CHAINS ANAEROBIC BOTTLE ONLY CRITICAL RESULT CALLED TO, READ BACK BY AND VERIFIED WITH: A. Love PharmD 14:20 09/24/18 ([email or web address]) Performed at [facility], [place] [address], [place], [place] [address]    Culture STREPTOCOCCUS ANGINOSIS (A)  Final   Report Status 09/26/2018 FINAL  Final   Organism ID, Bacteria STREPTOCOCCUS ANGINOSIS  Final      Susceptibility   Streptococcus anginosis - MIC*    PENICILLIN <=0.06 SENSITIVE Sensitive     CEFTRIAXONE 0.5 SENSITIVE Sensitive     ERYTHROMYCIN <=0.12 SENSITIVE Sensitive     LEVOFLOXACIN 0.5 SENSITIVE Sensitive     VANCOMYCIN 1 SENSITIVE Sensitive     * STREPTOCOCCUS ANGINOSIS  Blood Culture (routine x 2)     Status: None   Collection Time: 09/23/18  6:54 PM   Specimen: Site Not Specified; Blood  Result Value Ref Range Status   Specimen Description SITE NOT SPECIFIED  Final   Special Requests   Final    BOTTLES DRAWN AEROBIC ONLY Blood Culture adequate volume   Culture   Final    NO GROWTH 5 DAYS Performed at [facility], [place] [address] [place],  [address]    Report Status 09/28/2018  FINAL  Final  Blood Culture ID Panel (Reflexed)     Status: Abnormal   Collection Time: 09/23/18  6:54 PM  Result Value Ref Range Status   Enterococcus species NOT DETECTED NOT DETECTED Final   Listeria monocytogenes NOT DETECTED NOT DETECTED Final   Staphylococcus species NOT DETECTED NOT DETECTED Final   Staphylococcus aureus (BCID) NOT DETECTED NOT DETECTED Final   Streptococcus species DETECTED (A) NOT DETECTED Final    Comment: Not Enterococcus species, Streptococcus agalactiae, Streptococcus pyogenes, or Streptococcus pneumoniae. CRITICAL RESULT CALLED TO, READ BACK BY AND VERIFIED WITH: A. Love PharmD 14:20 09/24/18 ([email or web address])    Streptococcus agalactiae NOT DETECTED NOT DETECTED Final   Streptococcus pneumoniae NOT DETECTED NOT DETECTED Final   Streptococcus pyogenes NOT DETECTED NOT DETECTED Final   Acinetobacter baumannii NOT DETECTED NOT DETECTED Final   Enterobacteriaceae species NOT DETECTED NOT DETECTED Final   Enterobacter cloacae complex NOT DETECTED NOT DETECTED Final   Escherichia coli NOT DETECTED NOT DETECTED Final   Klebsiella oxytoca NOT DETECTED NOT DETECTED Final   Klebsiella pneumoniae NOT DETECTED NOT DETECTED Final   Proteus species NOT DETECTED NOT DETECTED Final   Serratia marcescens NOT DETECTED NOT DETECTED Final   Haemophilus influenzae NOT DETECTED NOT DETECTED Final   Neisseria meningitidis NOT DETECTED NOT DETECTED Final   Pseudomonas aeruginosa  NOT DETECTED NOT DETECTED Final   Candida albicans NOT DETECTED NOT DETECTED Final   Candida glabrata NOT DETECTED NOT DETECTED Final   Candida krusei NOT DETECTED NOT DETECTED Final   Candida parapsilosis NOT DETECTED NOT DETECTED Final   Candida tropicalis NOT DETECTED NOT DETECTED Final    Comment: Performed at [facility], [place] [address], [place], [place] [address]  SARS Coronavirus 2 (CEPHEID - Performed in [place] hospital lab), Hosp Order     Status: None   Collection Time: 09/23/18  7:04 PM    Specimen: Nasopharyngeal Swab  Result Value Ref Range Status   SARS Coronavirus 2 NEGATIVE NEGATIVE Final    Comment: (NOTE) If result is NEGATIVE SARS-CoV-2 target nucleic acids are NOT DETECTED. The SARS-CoV-2 RNA is generally detectable in upper and lower  respiratory specimens during the acute phase of infection. The lowest  concentration of SARS-CoV-2 viral copies this assay can detect is 250  copies / mL. A negative result does not preclude SARS-CoV-2 infection  and should not be used as the sole basis for treatment or other  patient management decisions.  A negative result may occur with  improper specimen collection / handling, submission of specimen other  than nasopharyngeal swab, presence of viral mutation(s) within the  areas targeted by this assay, and inadequate number of viral copies  (<250 copies / mL). A negative result must be combined with clinical  observations, patient history, and epidemiological information. If result is POSITIVE SARS-CoV-2 target nucleic acids are DETECTED. The SARS-CoV-2 RNA is generally detectable in upper and lower  respiratory specimens dur ing the acute phase of infection.  Positive  results are indicative of active infection with SARS-CoV-2.  Clinical  correlation with patient history and other diagnostic information is  necessary to determine patient infection status.  Positive results do  not rule out bacterial infection or co-infection with other viruses. If result is PRESUMPTIVE POSTIVE SARS-CoV-2 nucleic acids MAY BE PRESENT.   A presumptive positive result was obtained on the submitted specimen  and confirmed on repeat testing.  While 2019 novel coronavirus  (SARS-CoV-2) nucleic acids may be present in the submitted sample  additional confirmatory testing may be necessary for epidemiological  and / or clinical management purposes  to differentiate between  SARS-CoV-2 and other Sarbecovirus currently known to infect humans.  If  clinically indicated additional testing with an alternate test  methodology [phone]) is advised. The SARS-CoV-2 RNA is generally  detectable in upper and lower respiratory sp ecimens during the acute  phase of infection. The expected result is Negative. Fact Sheet for Patients:  StrictlyIdeas.no Fact Sheet for Healthcare Providers: BankingDealers.co.za This test is not yet approved or cleared by the [place] FDA and has been authorized for detection and/or diagnosis of SARS-CoV-2 by FDA under an Emergency Use Authorization (EUA).  This EUA will remain in effect (meaning this test can be used) for the duration of the COVID-19 declaration under Section 564(b)(1) of the Act, 21 U.S.C. section 360bbb-3(b)(1), unless the authorization is terminated or revoked sooner. Performed at [facility], [place] [address] [place], [place] [address]   Urine culture     Status: None   Collection Time: 09/23/18  8:35 PM   Specimen: Urine, Catheterized  Result Value Ref Range Status   Specimen Description URINE, CATHETERIZED  Final   Special Requests NONE  Final   Culture   Final    NO GROWTH Performed at [facility],  [address], [place], [place] [address]    Report Status 09/24/2018 FINAL  Final  C difficile quick scan w PCR reflex     Status: Abnormal   Collection Time: 09/23/18  8:35 PM   Specimen: Urine, Catheterized; Stool  Result Value Ref Range Status   C Diff antigen POSITIVE (A) NEGATIVE Final   C Diff toxin POSITIVE (A) NEGATIVE Final   C Diff interpretation Toxin producing C. difficile detected.  Final    Comment: CRITICAL RESULT CALLED TO, READ BACK BY AND VERIFIED WITH: A DENNIS RN 09/24/18 0015 JDW Performed at [facility], [address], [place], [place] [address]   Gastrointestinal Panel by PCR , Stool     Status: None   Collection Time: 09/23/18  8:35 PM   Specimen: Stool  Result Value Ref Range Status     Campylobacter species NOT DETECTED NOT DETECTED Final   Plesimonas shigelloides NOT DETECTED NOT DETECTED Final   Salmonella species NOT DETECTED NOT DETECTED Final   Yersinia enterocolitica NOT DETECTED NOT DETECTED Final   Vibrio species NOT DETECTED NOT DETECTED Final   Vibrio cholerae NOT DETECTED NOT DETECTED Final   Enteroaggregative E coli (EAEC) NOT DETECTED NOT DETECTED Final   Enteropathogenic E coli (EPEC) NOT DETECTED NOT DETECTED Final   Enterotoxigenic E coli (ETEC) NOT DETECTED NOT DETECTED Final   Shiga like toxin producing E coli (STEC) NOT DETECTED NOT DETECTED Final   Shigella/Enteroinvasive E coli (EIEC) NOT DETECTED NOT DETECTED Final   Cryptosporidium NOT DETECTED NOT DETECTED Final   Cyclospora cayetanensis NOT DETECTED NOT DETECTED Final   Entamoeba histolytica NOT DETECTED NOT DETECTED Final   Giardia lamblia NOT DETECTED NOT DETECTED Final   Adenovirus F40/41 NOT DETECTED NOT DETECTED Final   Astrovirus NOT DETECTED NOT DETECTED Final   Norovirus GI/GII NOT DETECTED NOT DETECTED Final   Rotavirus A NOT DETECTED NOT DETECTED Final   Sapovirus (I, II, IV, and V) NOT DETECTED NOT DETECTED Final    Comment: Performed at [facility], [place] [place], [place] [address]  CSF culture     Status: None   Collection Time: 09/23/18 10:12 PM   Specimen: CSF; Cerebrospinal Fluid  Result Value Ref Range Status   Specimen Description CSF  Final   Special Requests NONE  Final   Gram Stain NO WBC SEEN NO ORGANISMS SEEN CYTOSPIN SMEAR   Final   Culture   Final    NO GROWTH 3 DAYS Performed at [facility], [address] [place], [place] [address]    Report Status 09/27/2018 FINAL  Final  Culture, blood (routine x 2)     Status: None (Preliminary result)   Collection Time: 09/26/18 12:12 PM   Specimen: BLOOD RIGHT HAND  Result Value Ref Range Status   Specimen Description BLOOD RIGHT HAND  Final   Special Requests   Final    BOTTLES DRAWN  AEROBIC ONLY Blood Culture adequate volume   Culture   Final    NO GROWTH 2 DAYS Performed at [facility], [place] [address] [place], [place] [address]    Report Status PENDING  Incomplete  Culture, blood (routine x 2)     Status: None (Preliminary result)   Collection Time: 09/26/18 12:25 PM   Specimen: BLOOD RIGHT HAND  Result Value Ref Range Status   Specimen Description BLOOD RIGHT HAND  Final   Special Requests   Final    BOTTLES DRAWN AEROBIC  ONLY Blood Culture adequate volume   Culture   Final    NO GROWTH 2 DAYS Performed at [facility], [place] [address] [place], [place] [address]    Report Status PENDING  Incomplete    Radiology Reports Ct Abdomen Pelvis Wo Contrast  Result Date: 09/26/2018 CLINICAL DATA:  Abdominal pain with gastroenteritis or colitis suspected. EXAM: CT ABDOMEN AND PELVIS WITHOUT CONTRAST TECHNIQUE: Multidetector CT imaging of the abdomen and pelvis was performed following the standard protocol without IV contrast. COMPARISON:  CT dated September 24, 2018 FINDINGS: Lower chest: There are small bilateral pleural effusions with adjacent airspace opacifications suggestive of atelectasis, however aspiration or developing pneumonia is not excluded. Hepatobiliary: The liver is unremarkable. There is cholelithiasis without definite evidence of acute cholecystitis. Pancreas: Unremarkable. No pancreatic ductal dilatation or surrounding inflammatory changes. Spleen: Normal in size without focal abnormality. Adrenals/Urinary Tract: The native right kidney is not visualized. The native left kidney is atrophic. 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 There is some hyperdense material within the right renal collecting system. There is contrast within the urinary bladder. There is a small focus of gas within the urinary bladder and pelvic transplant kidney which may be secondary to instrumentation. Stomach/Bowel: Esophagus is dilated. There are postsurgical  changes near the GE junction. The stomach is unremarkable. Again identified are findings of severe colitis. Oral contrast is seen to the level of the sigmoid colon. There is no contrast seen in the rectum. There is diffuse rectal wall thickening. The appendix is not reliably identified. Vascular/Lymphatic: Aortic atherosclerosis. No enlarged abdominal or pelvic lymph nodes. Reproductive: Prostate is unremarkable. Other: There is new free fluid in the abdomen, greatest about the liver. No definite pneumatosis or free air. Musculoskeletal: There are healing fractures of the left inferior and superior pubic rami, the bilateral sacral all, the greater trochanter of the right femur, and the S2 vertebral body. IMPRESSION: 1. Examination limited by lack of IV contrast and lack of intra-abdominal fat. 2. Again noted are findings of severe pancolitis, not substantially improved from prior study. 3. New small volume free fluid in the abdomen. 4. New bibasilar airspace opacities concerning for atelectasis or aspiration. There are trace bilateral pleural effusions. 5. Dilated fluid-filled esophagus. 6. Cholelithiasis without CT evidence for acute cholecystitis. 7. There are foci of gas within the urinary bladder and right pelvic transplant kidney suggestive of recent instrumentation. 8. Again noted are multiple acute and subacute pelvic fractures as detailed above. Electronically Signed   By: Constance Holster M.D.   On: 09/26/2018 23:53   Ct Abdomen Pelvis Wo Contrast  Result Date: 09/24/2018 CLINICAL DATA:  63 year old male with diarrhea and sepsis. History of renal abscess and HIV. EXAM: CT ABDOMEN AND PELVIS WITHOUT CONTRAST TECHNIQUE: Multidetector CT imaging of the abdomen and pelvis was performed following the standard protocol without IV contrast. COMPARISON:  CT of the pelvis dated 08/25/2018 and CT of the abdomen pelvis dated 06/22/2014 FINDINGS: Evaluation of this exam is limited in the absence of intravenous  contrast. Evaluation is also limited due to cachexia. Lower chest: The visualized lung bases are clear. No intra-abdominal free air. Probable mesenteric edema. Hepatobiliary: The liver is grossly unremarkable. Layering stones noted within the gallbladder. No pericholecystic fluid. Pancreas: The pancreas is grossly unremarkable as visualized. Spleen: Normal in size without focal abnormality. Adrenals/Urinary Tract: The adrenal glands are grossly unremarkable. Atrophic left kidney. No hydronephrosis. 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 There is a punctate stone  in the region of the transplant hilum. Mild fullness of the renal transplant collecting system. No peritransplant fluid collection. The urinary bladder is decompressed around a Foley catheter and contains a small pocket of air. Stomach/Bowel: There is diffuse inflammatory changes and thickening of the colon and rectosigmoid consistent with pancolitis. Correlation with clinical exam and stool cultures recommended. No bowel obstruction. Surgical clips in the upper abdomen noted. The appendix is not visualized. Vascular/Lymphatic: The abdominal aorta and IVC are grossly unremarkable on this noncontrast CT. No portal venous gas. Evaluation of the lymph nodes is very limited. No obvious adenopathy. Reproductive: The prostate gland is poorly visualized. Other: Severe cachexia. Musculoskeletal: Nondisplaced fracture of the greater trochanter of the right femur. 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 Old left pubic bone fractures. Fractures of the sacral alum as seen on the prior CT with no healing. Nondisplaced fracture of the right L5 transverse process. IMPRESSION: 1. Pancolitis. Correlation with clinical exam and stool cultures recommended. No bowel obstruction. 2. Cholelithiasis. 3. Right lower quadrant renal transplant. No hydronephrosis. 4. Fractures of the greater trochanter of the right femur, right transverse process of L5,  sacrum, and left pubic bone. 5. Cachexia. Electronically Signed   By: Anner Crete M.D.   On: 09/24/2018 03:44   Mr Lumbar Spine Wo Contrast  Result Date: 09/24/2018 CLINICAL DATA:  Initial evaluation for acute sacral fractures. EXAM: MRI LUMBAR SPINE WITHOUT CONTRAST TECHNIQUE: Multiplanar, multisequence MR imaging of the lumbar spine was performed. No intravenous contrast was administered. COMPARISON:  Prior CT from earlier the same day. FINDINGS: Segmentation: Standard. Lowest well-formed disc labeled the L5-S1 level. Alignment: Mild levoscoliosis. Alignment otherwise normal with preservation of the normal lumbar lordosis. No listhesis or subluxation. Vertebrae: Abnormal marrow edema seen involving the left greater than right sacral ala, compatible with acute to subacute nondisplaced fractures. Findings stable from previous CT. Subtle linear signal intensity extending through the transverse processes of L5 also likely reflect acute to subacute nondisplaced fractures, also seen on prior CT. Vertebral body heights maintained with no other acute or chronic fracture identified. Bone marrow signal intensity diffusely decreased on T1 weighted imaging, most likely related to patient's underlying renal disease and anemia. No discrete or worrisome osseous lesions. No other abnormal marrow edema. Conus medullaris and cauda equina: Conus extends to the L1 level. Conus and cauda equina appear normal. Paraspinal and other soft tissues: Soft tissue edema seen about the lower lumbar spine and sacrum, likely posttraumatic in nature. No discrete hematoma or other collection. Changes compatible with acute colitis noted about the visualized descending colon. Right lower quadrant renal transplant partially visualized. Mild scattered free fluid noted within the visualized abdomen and pelvis. Disc levels: L1-2:  Unremarkable. L2-3:  Unremarkable. L3-4:  Unremarkable. L4-5: Mild diffuse disc bulge with disc desiccation.  Superimposed broad-based central disc protrusion mildly indents the ventral thecal sac. Mild facet hypertrophy. Resultant mild bilateral lateral recess stenosis without neural impingement. Foramina remain patent. L5-S1: Chronic intervertebral disc space narrowing with diffuse disc bulge and disc desiccation. Superimposed central disc protrusion with slight inferior angulation. Protruding disc indents the ventral thecal sac, slightly asymmetric to the right. Protruding disc closely approximates the descending S1 nerve roots without frank neural impingement, also greater on the right. Central canal remains patent. Mild bilateral L5 foraminal stenosis without impingement. IMPRESSION: 1. Acute to subacute bilateral sacral alar fractures, stable from prior CT. 2. Probable additional acute to subacute nondisplaced fractures of the bilateral transverse processes of L5, better  seen on prior CT. 3. Mild diffuse soft tissue edema about the lower lumbar spine and sacrum, likely posttraumatic/reactive in nature. No discrete hematoma or other collection. 4. Small central disc protrusion at L5-S1, closely approximating the right greater than left descending S1 nerve roots without frank impingement. 5. Central disc protrusion at L4-5 with resultant mild bilateral lateral recess stenosis. No frank impingement. 6. Findings consistent with acute colitis about the visualized descending colon, better seen on prior CT. Electronically Signed   By: Jeannine Boga M.D.   On: 09/24/2018 17:02   US Renal Transplant W/doppler  Result Date: 09/25/2018 CLINICAL DATA:  Acute kidney injury EXAM: ULTRASOUND OF RENAL TRANSPLANT WITH RENAL DOPPLER ULTRASOUND TECHNIQUE: Ultrasound examination of the renal transplant was performed with gray-scale, color and duplex doppler evaluation. COMPARISON:  CT dated September 24, 2018 FINDINGS: Transplant kidney location: RLQ Transplant Kidney: Renal measurements: 11.6 x 4.7 x 6.3 cm = volume: 174mL. The  echogenicity is within normal limits. There is no hydronephrosis. There appears to be a small calcification in the upper pole measuring approximately 0.7 cm. Color flow in the main renal artery:  Yes Color flow in the main renal vein:  Yes Duplex Doppler Evaluation: Main Renal Artery Resistive Index: 0.76 Venous waveform in main renal vein:  Present Intrarenal resistive index in upper pole:  0.69 (normal 0.6-0.8; equivocal 0.8-0.9; abnormal >= 0.9) Intrarenal resistive index in lower pole: 0.55 (normal 0.6-0.8; equivocal 0.8-0.9; abnormal >= 0.9) Bladder: Normal for degree of bladder distention. Other findings:  None. IMPRESSION: 1. Right lower quadrant pelvic transplant kidney with no evidence of hydronephrosis. 2. Normal resistive indices as detailed above. 3. Possible nonobstructing 7 mm stone in the upper pole of the transplant kidney. Electronically Signed   By: Constance Holster M.D.   On: 09/25/2018 21:30   Dg Chest Port 1 View  Result Date: 09/23/2018 CLINICAL DATA:  63 year old male with sepsis. EXAM: PORTABLE CHEST 1 VIEW COMPARISON:  Chest radiograph dated 01/22/2017 and CT dated 06/22/2014 FINDINGS: There is mild eventration of the left hemidiaphragm. There is no focal consolidation, pleural effusion, or pneumothorax. Mild prominence of the pulmonary vasculature with cephalization may represent mild congestion. Apparent 9 mm nodular density over the posterior right sixth rib likely an area of old healed fracture. A pulmonary nodule is less likely. Nonemergent CT may provide better evaluation if clinically indicated. Additional old healed right posterior rib fractures noted. There is a 5 mm nodular density over the left mid lung field, possibly a granuloma. The cardiac silhouette is within normal limits. There is dextroscoliosis of the thoracic spine. No acute osseous pathology. IMPRESSION: 1. No focal consolidation. 2. Possible mild vascular congestion. 3. Probable focus of old healed rib fracture  versus less likely a 9 mm right pulmonary nodule. CT may provide better evaluation on a nonemergent basis. Electronically Signed   By: Anner Crete M.D.   On: 09/23/2018 19:11    Time Spent in minutes 35   Ova Meegan M.D on 09/28/2018 at 12:45 PM  Between 7am to 7pm - Pager - [phone]  After 7pm go to [email or web address] - password [facility]  [facility] -  Office  [phone]

## 2018-09-29 ENCOUNTER — Encounter (HOSPITAL_COMMUNITY)
Admission: EM | Disposition: A | Discharge status: Other Acute Inpatient Hospital | Payer: Self-pay | Source: Skilled Nursing Facility | Attending: Internal Medicine

## 2018-09-29 ENCOUNTER — Encounter (HOSPITAL_COMMUNITY): Payer: Self-pay | Admitting: *Deleted

## 2018-09-29 ENCOUNTER — Inpatient Hospital Stay (HOSPITAL_COMMUNITY): Payer: Medicare Other

## 2018-09-29 ENCOUNTER — Other Ambulatory Visit: Payer: Self-pay

## 2018-09-29 DIAGNOSIS — A0472 Enterocolitis due to Clostridium difficile, not specified as recurrent: Secondary | ICD-10-CM | POA: Diagnosis present

## 2018-09-29 DIAGNOSIS — K567 Ileus, unspecified: Secondary | ICD-10-CM | POA: Diagnosis not present

## 2018-09-29 DIAGNOSIS — N170 Acute kidney failure with tubular necrosis: Secondary | ICD-10-CM | POA: Diagnosis present

## 2018-09-29 DIAGNOSIS — A419 Sepsis, unspecified organism: Secondary | ICD-10-CM | POA: Diagnosis present

## 2018-09-29 DIAGNOSIS — K562 Volvulus: Secondary | ICD-10-CM

## 2018-09-29 DIAGNOSIS — R6520 Severe sepsis without septic shock: Secondary | ICD-10-CM | POA: Diagnosis present

## 2018-09-29 HISTORY — PX: FLEXIBLE SIGMOIDOSCOPY: SHX5431

## 2018-09-29 HISTORY — PX: BOWEL DECOMPRESSION: SHX5532

## 2018-09-29 LAB — CBC
HCT: 33.8 % — ABNORMAL LOW (ref 39.0–52.0)
Hemoglobin: 12.1 g/dL — ABNORMAL LOW (ref 13.0–17.0)
MCH: 31.4 pg (ref 26.0–34.0)
MCHC: 35.8 g/dL (ref 30.0–36.0)
MCV: 87.8 fL (ref 80.0–100.0)
Platelets: 72 10*3/uL — ABNORMAL LOW (ref 150–400)
RBC: 3.85 MIL/uL — ABNORMAL LOW (ref 4.22–5.81)
RDW: 14.6 % (ref 11.5–15.5)
WBC: 2.5 10*3/uL — ABNORMAL LOW (ref 4.0–10.5)
nRBC: 0.8 % — ABNORMAL HIGH (ref 0.0–0.2)

## 2018-09-29 LAB — RENAL FUNCTION PANEL
Albumin: 1.5 g/dL — ABNORMAL LOW (ref 3.5–5.0)
Anion gap: 14 (ref 5–15)
BUN: 89 mg/dL — ABNORMAL HIGH (ref 8–23)
CO2: 18 mmol/L — ABNORMAL LOW (ref 22–32)
Calcium: 6.5 mg/dL — ABNORMAL LOW (ref 8.9–10.3)
Chloride: 99 mmol/L (ref 98–111)
Creatinine, Ser: 4.13 mg/dL — ABNORMAL HIGH (ref 0.61–1.24)
GFR calc Af Amer: 17 mL/min — ABNORMAL LOW (ref >60)
GFR calc non Af Amer: 15 mL/min — ABNORMAL LOW (ref >60)
Glucose, Bld: 105 mg/dL — ABNORMAL HIGH (ref 70–99)
Phosphorus: 3.2 mg/dL (ref 2.5–4.6)
Potassium: 4.9 mmol/L (ref 3.5–5.1)
Sodium: 131 mmol/L — ABNORMAL LOW (ref 135–145)

## 2018-09-29 LAB — CALCIUM, IONIZED: Calcium, Ionized, Serum: 3.9 mg/dL — ABNORMAL LOW (ref 4.5–5.6)

## 2018-09-29 SURGERY — SIGMOIDOSCOPY, FLEXIBLE
Anesthesia: Moderate Sedation

## 2018-09-29 MED ORDER — TACROLIMUS 0.5 MG PO CAPS: 0.5000 mg | ORAL_CAPSULE | Freq: Two times a day (BID) | ORAL | 0 refills | Status: DC

## 2018-09-29 MED ORDER — NYSTATIN 100000 UNIT/ML MT SUSP: 5.0000 mL | Freq: Four times a day (QID) | OROMUCOSAL | 0 refills | Status: DC

## 2018-09-29 MED ORDER — VANCOMYCIN HCL 500 MG IV SOLR: 500.0000 mg | Freq: Four times a day (QID) | 0 refills | Status: DC

## 2018-09-29 MED ORDER — PENICILLIN G POTASSIUM 20000000 UNITS IJ SOLR: 4.0000 10*6.[IU] | Freq: Three times a day (TID) | INTRAVENOUS | 0 refills | Status: DC

## 2018-09-29 MED ORDER — METRONIDAZOLE IN NACL 5-0.79 MG/ML-% IV SOLN: 500.0000 mg | Freq: Three times a day (TID) | INTRAVENOUS | 0 refills | Status: DC

## 2018-09-29 MED ORDER — FENTANYL CITRATE (PF) 100 MCG/2ML IJ SOLN
INTRAMUSCULAR | Status: DC | PRN
  Administered 2018-09-29: 25 ug via INTRAVENOUS

## 2018-09-29 MED ORDER — MIDAZOLAM HCL (PF) 10 MG/2ML IJ SOLN
INTRAMUSCULAR | Status: DC | PRN
  Administered 2018-09-29: 2 mg via INTRAVENOUS

## 2018-09-29 MED ORDER — MAGIC MOUTHWASH: 5.0000 mL | Freq: Three times a day (TID) | ORAL | 0 refills | Status: DC

## 2018-09-29 MED ORDER — DIPHENHYDRAMINE HCL 50 MG/ML IJ SOLN
INTRAMUSCULAR | Status: AC
  Filled 2018-09-29: qty 1

## 2018-09-29 MED ORDER — FENTANYL CITRATE (PF) 100 MCG/2ML IJ SOLN
INTRAMUSCULAR | Status: AC
  Filled 2018-09-29: qty 2

## 2018-09-29 MED ORDER — MIDAZOLAM HCL (PF) 5 MG/ML IJ SOLN
INTRAMUSCULAR | Status: AC
  Filled 2018-09-29: qty 2

## 2018-09-29 NOTE — Op Note (Signed)
[facility] Patient Name: Kenneth [name] Procedure Date : 09/29/2018 MRN: [id number] Attending MD: Nancy Fetter Dr., MD Date of Birth: 1955-10-11 CSN: [id number] Age: 63 Admit Type: Inpatient Procedure:                Flexible Sigmoidoscopy with decompression of                            volvulus Indications:              Pseudomembranous colitis, Volvulus Providers:                [name] L. Deland Slocumb Dr., MD, Carlyn Reichert, RN,                            Caesar Dalton, Technician Referring MD:              Medicines:                Fentanyl 25 micrograms IV, Midazolam 2 mg IV Complications:            No immediate complications. Estimated Blood Loss:     Estimated blood loss: none. Procedure:                Pre-Anesthesia Assessment:                           - Prior to the procedure, a History and Physical                            was performed, and patient medications and                            allergies were reviewed. The patient's tolerance of                            previous anesthesia was also reviewed. The risks                            and benefits of the procedure and the sedation                            options and risks were discussed with the patient.                            All questions were answered, and informed consent                            was obtained. Prior Anticoagulants: The patient has                            taken no previous anticoagulant or antiplatelet                            agents. ASA Grade Assessment: IV - A patient with  severe systemic disease that is a constant threat                            to life. After reviewing the risks and benefits,                            the patient was deemed in satisfactory condition to                            undergo the procedure.                           After obtaining informed consent, the scope was                            passed under  direct vision. The PCF-H190DL                            ([id number] Olympus pediatric colonscope was                            introduced through the anus and advanced to the the                            descending colon. The [id number]-H190 ([id number] Olympus                            gastroscope was introduced through the and advanced                            to the. The flexible sigmoidoscopy was extremely                            difficult due to poor bowel prep and a redundant                            colon. The patient tolerated the procedure well.                            The quality of the bowel preparation was {skip}.                            The quality of the bowel preparation was poor, done                            unprepped. Scope In: 11:30:41 AM Scope Out: 11:45:33 AM Total Procedure Duration: 0 hours 14 minutes 52 seconds  Findings:      The perianal exam findings include internal hemorrhoids that prolapse       with straining, but require manual replacement into the anal canal       (Grade III).      A diffuse pseudomembrane was found in the sigmoid colon and in the       descending colon. Multiple confluent ulcerations throughout the  colon       with severe edema and friability. We were able to get past the first       compression into the volvulus loop. Large amount of liquid in stool and       this was decompressed. The severe pseudomembranes and ulcerations were       present there. Due to poor visibility were not able to advance further.       There was a large amount of liquid stool expressed in gas. The colon was       decompressed.      There is no endoscopic evidence of any significant findings in the       recto-sigmoid colon and in the descending colon. Impression:               - Internal hemorrhoids that prolapse with                            straining, but require manual replacement into the                            anal canal (Grade III) found  on perianal exam.                           - Pseudomembranous enterocolitis.                           - No specimens collected.                           - Sigmoid volvulus. This was decompressed Recommendation:           - Return patient to hospital ward for ongoing care.                           - We will place rectal tube to low suction Procedure Code(s):        --- Professional ---                           [phone], Sigmoidoscopy, flexible; diagnostic,                            including collection of specimen(s) by brushing or                            washing, when performed (separate procedure) Diagnosis Code(s):        --- Professional ---                           M54.6, Volvulus                           A04.72, Enterocolitis due to Clostridium difficile,                            not specified as recurrent CPT copyright 2019 [employer]. All rights reserved. The codes documented in this report are preliminary and upon coder review may  be revised  to meet current compliance requirements. Nancy Fetter Dr., MD 09/29/2018 12:53:55 PM This report has been signed electronically. Number of Addenda: 0

## 2018-09-29 NOTE — Progress Notes (Signed)
Sigmoidoscopy showed severe pseudomembranous colitis.  Volvulus decompressed no obvious gangrene with severe pseudomembranes and ulcerations friable swollen mucosa.  Did not appear to be any perforation abdomen was much flatter at the termination of the procedure.  Discussed results with Dr. Altamease Oiler, [phone] the transplant medical doctor.  [name] will be excepting him later today.  We discussed getting GI and surgery involved early on since the patient has been on aggressive therapy with oral vancomycin, vancomycin enemas and IV metronidazole but still has severe pseudomembranous colitis.  I told him that I felt he would need to be considered for colectomy so I would get the surgical team involved early.  This was discussed with him.

## 2018-09-29 NOTE — Progress Notes (Signed)
Patient is due to be transferred to [facility] transplant service  later today.  Having worsening pain and film showed that he has developed sigmoid volvulus.  Have discussed with hospitalist and surgery has been consulted.  Felt that we should try to decompress his volvulus possibly leave a rectal tube in place prior to his transfer to diminish the chances of ischemia.  Volvulus was marked obvious on CT scan couple days ago.  Have discussed this with patient.  He is in a great deal of pain.  Stated that we can go ahead and proceed.  We will try to do this at the bedside due to his multiple other medical problems.

## 2018-09-29 NOTE — Progress Notes (Signed)
Spoke with Merilyn Baba (pt's niece) to let her know patient is on his way to [facility].  Questions and concerns were answered.

## 2018-09-29 NOTE — Progress Notes (Signed)
EKG completed and placed in pt's chart.

## 2018-09-29 NOTE — Progress Notes (Signed)
Report given to [facility] RN  From [facility]. [address] bed # [id number].

## 2018-09-29 NOTE — Progress Notes (Addendum)
[facility] Progress Note  Name: Kenneth [name] MRN: [id number] DOB: [birth year]  Chief Complaint:  Diarrhea   Subjective:  Primary team states he is being set up for transfer to [place] today; initially anticipated last night.  Reduced dose of prograf started at 0.5 mg last night after discussion with transplant neph.  He has been on NS at 50/hr.  He has had 450 mL UOP over 6/27 via foley.    On exam he states he is "fine" but he is markedly tender to light palpation.  Last BP charted as 91/43 and HR 122 which improves to 121/87 and HR 89 on exam.   Review of systems:   + abdominal pain   No bowel movement and not passing gas Having nausea without emesis Denies shortness of breath   --------------- Background on consult:  Kenneth [name] is a 63 y.o. male with a history of ESRD s/p renal transplant at [facility] 04/13/2018 as well as HIV who presented with diarrhea and decreased PO intake.  He was found to have C dif colitis.  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  Note Cr 1.1 on 09/05/18.   He was found to be c dif positive and strep bacteremia.  He has been on PO vanc and IV flagyl and cefepime for the bacteremia.  He has also received normal saline.  Creatinine has improved from 3.51 to 2.99 on consult.  Note SARS coronavirus 2 negative.  He had a recent endoscopy at [facility] which demonstrated ulcerations in the mid and distal esophagus per charting.  No pain over transplant.  Lives at a facility and reports compliance with meds.  Appears a FK506 level drawn earlier today may not be a trough.  He has a foley and this is about to be changed to a condom cath per primary team.  Abdominal CT with punctate stone in the region of the transplant hilum. Mild fullness of the renal transplant collecting system. No peritransplant fluid collection. The urinary bladder is decompressed around a Foley catheter and contains a small pocket of air.  We  discussed his renal function.  If his renal function declines to the point that he would need it, he would want hemodialysis.  Spoke with transplant neph at [facility].  Hold MMF for now.  Will reassess restarting in 1-2 days once diarrhea improved.     Intake/Output Summary (Last 24 hours) at 09/29/2018 0820 Last data filed at 09/29/2018 0125 Gross per 24 hour  Intake 2741.27 ml  Output 450 ml  Net 2291.27 ml    Vitals:  Vitals:   09/28/18 0518 09/28/18 1955 09/29/18 0300 09/29/18 0747  BP: 128/81 139/86 (!) 142/76 (!) 91/43  Pulse: (!) 105 (!) 110  (!) 122  Resp:   18 20  Temp: (!) 97.5 F (36.4 C) (!) 97.2 F (36.2 C) (!) 97.2 F (36.2 C) 97.9 F (36.6 C)  TempSrc: Oral  Oral Oral  SpO2: 99% 96% 96% 94%  Weight: 57.3 kg  56.4 kg   Height:         Physical Exam:   General thin adult male in bed uncomfortable HEENT dry mm; normocephalic atraumatic extraocular movements intact sclera anicteric Neck supple trachea midline Lungs clear to auscultation bilaterally  Heart tachycardia Abdomen thin and distended; abdomen is tender to light palpation  Extremities no lower extremity edema  Psych normal mood and affect GU - foley cath  RLQ  allograft   Medications reviewed   Labs:  BMP Latest Ref Rng & Units 09/28/2018 09/27/2018 09/27/2018  Glucose 70 - 99 mg/dL 158(H) 147(H) 426(H)  BUN 8 - 23 mg/dL 96(H) 92(H) 82(H)  Creatinine 0.61 - 1.24 mg/dL 3.73(H) 3.75(H) 3.13(H)  BUN/Creat Ratio 6 - 22 (calc) - - -  Sodium 135 - 145 mmol/L 131(L) 132(L) 134(L)  Potassium 3.5 - 5.1 mmol/L 4.7 4.1 3.4(L)  Chloride 98 - 111 mmol/L 99 98 88(L)  CO2 22 - 32 mmol/L 19(L) 20(L) 35(H)  Calcium 8.9 - 10.3 mg/dL 6.7(L) 6.7(L) 5.6(LL)     Assessment/Plan:   # Severe c difficile colitis with abdominal pain - Recommend surgical evaluation. Spoke with primary team   - Patient NPO - Noted vanc enemas, prior PO vanc as well as IV flagyl - Severe colitis on CT  - Primary team is following closely.   GI consulted as well   # AKI  - Pre-renal insults in the setting of severe cdif colitis and sepsis and also with supratherapeutic prograf level (not a 12-hour trough but two random levels markedly elevated).  Note his up/cr ratio returned today with 1330 mg/g - Continue foley   - his AM labs are just being drawn   - He has had no emergent indication for HD per last labs but he is progressing toward the need for dialysis   # Metabolic acidosis - GI losses as well as AKI.  Last lactic acid level 1.5 from 2.6 - s/p bicarb infusion  # ESRD s/p renal transplant  - Immunosuppression as below.  Cr 1.1 on 09/05/18 - Transplant ultrasound with no evidence of hydro and possible nonobstructing 7 mm stone in the upper pole of the transplant kidney.  # Streptococcus bacteremia - on penicillin per ID   # Immunosuppression  - Normally on cellcept 250 mg BID (low dose due to leukopenia) and prograf 1.5 mg BID and prednisone 10 mg daily  - I have contacted transplant team at [facility].  He is off of cellcept and after a pause is on reduced dose of prograf at 0.5 mg BID (begun 6/27 PM per transplant neph) - Note there is a FK506 level from 11:53 am in process  - Titration of immunsuppression is hindered by the delay in return of results    - Continue prednisone  - note there is also CMV DNA quant in process from 6/27 as well   # Hypocalcemia - repleted; corrected calcium 8.7 last check  - ionized calcium from 6/26 is still in process...   # Hematuria  - Resolved on repeat UA  # SLE  - on hydroxychloroquine; off cellcept for now as above   # HIV  - on home regimen per transplant ID   # Oral ulcers - Per primary team the PO acyclovir was started for oral ulcers  Claudia Desanctis, MD 09/29/2018 8:20 AM

## 2018-09-29 NOTE — Discharge Summary (Signed)
Physician Discharge Summary  Kenneth [name] ZOX:[id number] DOB: January 19, 1956 DOA: 09/23/2018  PCP: [name], MD  Admit date: 09/23/2018 Discharge date: 09/29/2018  Admitted From: Skilled nursing facility Disposition: [facility] (stepdown unit).  Accepting physician: Dr. Aquilla Hacker.   Recommendations for Outpatient Follow-up:  Patient being transferred to [facility] for further management. General surgery needs to be consulted once patient arrives to the unit    Discharge Condition: Guarded CODE STATUS: Full code Diet recommendation: N.p.o.    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 intake and 4-day history of diarrhea.  Patient was severely septic with fever, hypotension and acute kidney injury on presentation.  He was tested positive for C. difficile.  He even required vasopressors but quickly improved with 2 L IV fluid bolus.  CT of the abdomen pelvis showed pancolitis and several new fractures involving greater trochanter of the right femur, sacral fracture and L5 fracture (patient has had sacral fracture when he was hospitalized in May and orthopedics recommended weightbearing and nonsurgical intervention). Patient admitted to stepdown for severe sepsis and ATN secondary to C. difficile pancolitis.  Hospital  course  Principal Problem:   Severe sepsis ([place]) Secondary to C. difficile pancolitis.    Subsequently developed ileus and sigmoid volvulus on  x-ray today.  Receiving IV Flagyl since 6/23 and was on oral vancomycin, switched to vancomycin enema on 6/26.   ID and GI following. Repeat CT of the abdomen on 6/25 with oral contrast again showed severe pancolitis unchanged from admission.  No megacolon or abscess noted.   Pseudomembranous colitis  Abdominal distention and pain worsened since yesterday.  Stat abdominal x-ray showing sigmoid volvulus.  GI urgently took him to endoscopy for flexible sigmoidoscopy with decompression of the volvulus.  Noted for diffuse pseudomembrane in the sigmoid colon and descending colon with multiple confluent ulceration throughout the colon with severe edema and friability.  Successfully decompressed and rectal tube placed in. Patient felt somewhat better with reduced abdominal distention and tenderness on follow-up exam.  Strep bacteremia Blood culture 1/3 growing strep anginosis.  [name] placed him on IV pain G.  Repeat blood culture from 6/25 was negative.  ID discontinued the penicillin today  given his worsened renal function.  Needs [name] and follow-up at [facility].  Active Problems:  Acute tubular necrosis (HCC) Suspect prerenal with severe sepsis/C. difficile colitis.  No improvement with fluid resuscitation and continues to further worsen.    Foley placed in.  Renal following.  Discontinue Bactrim and avoid nephrotoxic agents. Highly concerning for progressing to dialysis.  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  Continue Prograf.  Holding CellCept and Plaquenil.  [name] communicating with transplant nephrologist at [facility] and has been accepted there.    Right transverse process L5 fracture along  with sacral fractures MRI done shows acute to subacute bilateral  sacral alar fracture stable from prior CT.  New acute or subacute nondisplaced fracture of the bilateral transfer process of L5, mild diffuse soft tissue edema of the lower lumbar and sacral spine.  Central disc protrusion at L4-L5 with mild bilateral lateral recess stenosis. Orthopedics consult recommends nonoperative management with weightbearing as tolerated.    Low vitamin D level and placed on supplement.  Follow-up in office in 2 weeks to undergo full bone metabolism work-up.    HIV AIDS. CD4 of 45.  Likely due to sepsis.  Was 266 months ago.  Reports that he is now following at [facility].  Patient states he has been adherent to his ART.  [name] appreciated.  Hold Bactrim given worsened renal function.  Acute metabolic encephalopathy (HCC) Suspect secondary to severe sepsis.  Underwent LP which was unremarkable.  Mental status currently at baseline.    Pancytopenia Has leukopenia and now thrombocytopenia secondary to sepsis.  Monitor closely.   Hypokalemia/hypocalcemia Was replenished.  Holding further replenishment given worsened renal function.    Current chronic use of systemic steroids Oral prednisone.  Oral ulcers Continue Magic mouthwash.    Received 5 days of oral acyclovir.  Given his renal function will hold further.  History of mid and lower esophageal ulceration (as per endoscopy done at [facility] done on 6/17). On nystatin.  Surgical pathology not available on care everywhere.  Severe protein calorie malnutrition ([place]) Physical deconditioning Was seen by PT who recommended SNF   Disposition:  Given progressively worsened renal function transfer nephrology at [facility] has been consulted and accepted there.  I have communicated the hospital course, vitals, labs and changes from this morning in detail with the accepting physician Dr. Altamease Oiler on the phone.  Dr. [name] (GI) has also communicated with him regarding the sigmoidoscopy finding.   Code Status : Full  code, condition guarded  Family Communication  : None at bedside    Disposition: Transfer to [name]  Consults  : Nephrology, [name], ID  Procedures  : CT abdomen, MRI lumbar spine, ultrasound abdomen.  Sigmoidoscopy with decompression  Discharge Instructions   Allergies as of 09/29/2018   No Known Allergies     Medication List    STOP taking these medications   amitriptyline 10 MG tablet Commonly known as: ELAVIL   HYDROcodone-acetaminophen 7.5-325 MG tablet Commonly known as: Norco   hydroxychloroquine 200 MG tablet Commonly known as: PLAQUENIL   mycophenolate 250 MG capsule Commonly known as: CELLCEPT   saccharomyces boulardii 250 MG capsule Commonly known as: FLORASTOR   sulfamethoxazole-trimethoprim 800-160 MG tablet Commonly known as: BACTRIM DS     TAKE these medications   abacavir-dolutegravir-lamiVUDine 600-50-300 MG tablet Commonly known as: TRIUMEQ Take 1 tablet by mouth daily.   aspirin 81 MG chewable tablet Chew 81 mg by mouth daily.   calcitRIOL 0.25 MCG capsule Commonly known as: ROCALTROL Take 0.25 mcg by mouth every Monday, Wednesday, and Friday. FOR ESRD WITH TRANSPLANT (DO NOT CRUSH)   Cholecalciferol 50 MCG (2000 UT) Tabs Take 2,000 Units by mouth daily.   magic mouthwash Soln Take 5 mLs by mouth 3 (three) times daily.   metoprolol tartrate 25 MG tablet Commonly known as: LOPRESSOR Take 12.5 mg by mouth 2 (two) times daily. Hold for SBP <105   metroNIDAZOLE 5-0.79 MG/ML-% IVPB Commonly known as: FLAGYL Inject 100 mLs (500 mg total) into the vein every 8 (eight) hours.   nystatin 100000 UNIT/ML  suspension Commonly known as: MYCOSTATIN Take 5 mLs (500,000 Units total) by mouth 4 (four) times daily.   penicillin G potassium 4 Million Units in dextrose 5 % 250 mL Inject 4 Million Units into the vein every 8 (eight) hours.   predniSONE 10 MG tablet Commonly known as: DELTASONE Take 1 tablet (10 mg total) by mouth daily.    sucralfate 1 GM/10ML suspension Commonly known as: CARAFATE Take 10 mLs by mouth 4 (four) times daily -  before meals and at bedtime. For 14 days   tacrolimus 0.5 MG capsule Commonly known as: PROGRAF Take 1 capsule (0.5 mg total) by mouth 2 (two) times daily. What changed:   how much to take  when to take this   tamsulosin 0.4 MG Caps capsule Commonly known as: FLOMAX Take 0.4 mg by mouth daily.   vancomycin 500 mg in sodium chloride irrigation 0.9 % 100 mL Place 500 mg rectally every 6 (six) hours.      Follow-up Information    Haddix, Thomasene Lot, MD. Schedule an appointment as soon as possible for a visit in 2 week(s).   Specialty: Orthopedic Surgery Why: Repeat x-rays of right hip and pelvis Contact information: [place] [address] [phone]          No Known Allergies      Procedures/Studies: Ct Abdomen Pelvis Wo Contrast  Result Date: 09/26/2018 CLINICAL DATA:  Abdominal pain with gastroenteritis or colitis suspected. EXAM: CT ABDOMEN AND PELVIS WITHOUT CONTRAST TECHNIQUE: Multidetector CT imaging of the abdomen and pelvis was performed following the standard protocol without IV contrast. COMPARISON:  CT dated September 24, 2018 FINDINGS: Lower chest: There are small bilateral pleural effusions with adjacent airspace opacifications suggestive of atelectasis, however aspiration or developing pneumonia is not excluded. Hepatobiliary: The liver is unremarkable. There is cholelithiasis without definite evidence of acute cholecystitis. Pancreas: Unremarkable. No pancreatic ductal dilatation or surrounding inflammatory changes. Spleen: Normal in size without focal abnormality. Adrenals/Urinary Tract: The native right kidney is not visualized. The native left kidney is atrophic. 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 There is some hyperdense material within the right renal collecting system. There is contrast within the urinary bladder. There  is a small focus of gas within the urinary bladder and pelvic transplant kidney which may be secondary to instrumentation. Stomach/Bowel: Esophagus is dilated. There are postsurgical changes near the GE junction. The stomach is unremarkable. Again identified are findings of severe colitis. Oral contrast is seen to the level of the sigmoid colon. There is no contrast seen in the rectum. There is diffuse rectal wall thickening. The appendix is not reliably identified. Vascular/Lymphatic: Aortic atherosclerosis. No enlarged abdominal or pelvic lymph nodes. Reproductive: Prostate is unremarkable. Other: There is new free fluid in the abdomen, greatest about the liver. No definite pneumatosis or free air. Musculoskeletal: There are healing fractures of the left inferior and superior pubic rami, the bilateral sacral all, the greater trochanter of the right femur, and the S2 vertebral body. IMPRESSION: 1. Examination limited by lack of IV contrast and lack of intra-abdominal fat. 2. Again noted are findings of severe pancolitis, not substantially improved from prior study. 3. New small volume free fluid in the abdomen. 4. New bibasilar airspace opacities concerning for atelectasis or aspiration. There are trace bilateral pleural effusions. 5. Dilated fluid-filled esophagus. 6. Cholelithiasis without CT evidence for acute cholecystitis. 7. There are foci of gas within the urinary bladder and right pelvic transplant kidney suggestive of  recent instrumentation. 8. Again noted are multiple acute and subacute pelvic fractures as detailed above. Electronically Signed   By: Constance Holster M.D.   On: 09/26/2018 23:53   Ct Abdomen Pelvis Wo Contrast  Result Date: 09/24/2018 CLINICAL DATA:  63 year old male with diarrhea and sepsis. History of renal abscess and HIV. EXAM: CT ABDOMEN AND PELVIS WITHOUT CONTRAST TECHNIQUE: Multidetector CT imaging of the abdomen and pelvis was performed following the standard protocol without  IV contrast. COMPARISON:  CT of the pelvis dated 08/25/2018 and CT of the abdomen pelvis dated 06/22/2014 FINDINGS: Evaluation of this exam is limited in the absence of intravenous contrast. Evaluation is also limited due to cachexia. Lower chest: The visualized lung bases are clear. No intra-abdominal free air. Probable mesenteric edema. Hepatobiliary: The liver is grossly unremarkable. Layering stones noted within the gallbladder. No pericholecystic fluid. Pancreas: The pancreas is grossly unremarkable as visualized. Spleen: Normal in size without focal abnormality. Adrenals/Urinary Tract: The adrenal glands are grossly unremarkable. Atrophic left kidney. No hydronephrosis. 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 There is a punctate stone in the region of the transplant hilum. Mild fullness of the renal transplant collecting system. No peritransplant fluid collection. The urinary bladder is decompressed around a Foley catheter and contains a small pocket of air. Stomach/Bowel: There is diffuse inflammatory changes and thickening of the colon and rectosigmoid consistent with pancolitis. Correlation with clinical exam and stool cultures recommended. No bowel obstruction. Surgical clips in the upper abdomen noted. The appendix is not visualized. Vascular/Lymphatic: The abdominal aorta and IVC are grossly unremarkable on this noncontrast CT. No portal venous gas. Evaluation of the lymph nodes is very limited. No obvious adenopathy. Reproductive: The prostate gland is poorly visualized. Other: Severe cachexia. Musculoskeletal: Nondisplaced fracture of the greater trochanter of the right femur. 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 Old left pubic bone fractures. Fractures of the sacral alum as seen on the prior CT with no healing. Nondisplaced fracture of the right L5 transverse process. IMPRESSION: 1. Pancolitis. Correlation with clinical exam and stool cultures recommended. No bowel  obstruction. 2. Cholelithiasis. 3. Right lower quadrant renal transplant. No hydronephrosis. 4. Fractures of the greater trochanter of the right femur, right transverse process of L5, sacrum, and left pubic bone. 5. Cachexia. Electronically Signed   By: Anner Crete M.D.   On: 09/24/2018 03:44   Mr Lumbar Spine Wo Contrast  Result Date: 09/24/2018 CLINICAL DATA:  Initial evaluation for acute sacral fractures. EXAM: MRI LUMBAR SPINE WITHOUT CONTRAST TECHNIQUE: Multiplanar, multisequence MR imaging of the lumbar spine was performed. No intravenous contrast was administered. COMPARISON:  Prior CT from earlier the same day. FINDINGS: Segmentation: Standard. Lowest well-formed disc labeled the L5-S1 level. Alignment: Mild levoscoliosis. Alignment otherwise normal with preservation of the normal lumbar lordosis. No listhesis or subluxation. Vertebrae: Abnormal marrow edema seen involving the left greater than right sacral ala, compatible with acute to subacute nondisplaced fractures. Findings stable from previous CT. Subtle linear signal intensity extending through the transverse processes of L5 also likely reflect acute to subacute nondisplaced fractures, also seen on prior CT. Vertebral body heights maintained with no other acute or chronic fracture identified. Bone marrow signal intensity diffusely decreased on T1 weighted imaging, most likely related to patient's underlying renal disease and anemia. No discrete or worrisome osseous lesions. No other abnormal marrow edema. Conus medullaris and cauda equina: Conus extends to the L1 level. Conus and cauda equina appear normal. Paraspinal and other soft  tissues: Soft tissue edema seen about the lower lumbar spine and sacrum, likely posttraumatic in nature. No discrete hematoma or other collection. Changes compatible with acute colitis noted about the visualized descending colon. Right lower quadrant renal transplant partially visualized. Mild scattered free fluid  noted within the visualized abdomen and pelvis. Disc levels: L1-2:  Unremarkable. L2-3:  Unremarkable. L3-4:  Unremarkable. L4-5: Mild diffuse disc bulge with disc desiccation. Superimposed broad-based central disc protrusion mildly indents the ventral thecal sac. Mild facet hypertrophy. Resultant mild bilateral lateral recess stenosis without neural impingement. Foramina remain patent. L5-S1: Chronic intervertebral disc space narrowing with diffuse disc bulge and disc desiccation. Superimposed central disc protrusion with slight inferior angulation. Protruding disc indents the ventral thecal sac, slightly asymmetric to the right. Protruding disc closely approximates the descending S1 nerve roots without frank neural impingement, also greater on the right. Central canal remains patent. Mild bilateral L5 foraminal stenosis without impingement. IMPRESSION: 1. Acute to subacute bilateral sacral alar fractures, stable from prior CT. 2. Probable additional acute to subacute nondisplaced fractures of the bilateral transverse processes of L5, better seen on prior CT. 3. Mild diffuse soft tissue edema about the lower lumbar spine and sacrum, likely posttraumatic/reactive in nature. No discrete hematoma or other collection. 4. Small central disc protrusion at L5-S1, closely approximating the right greater than left descending S1 nerve roots without frank impingement. 5. Central disc protrusion at L4-5 with resultant mild bilateral lateral recess stenosis. No frank impingement. 6. Findings consistent with acute colitis about the visualized descending colon, better seen on prior CT. Electronically Signed   By: Jeannine Boga M.D.   On: 09/24/2018 17:02   US Renal Transplant W/doppler  Result Date: 09/25/2018 CLINICAL DATA:  Acute kidney injury EXAM: ULTRASOUND OF RENAL TRANSPLANT WITH RENAL DOPPLER ULTRASOUND TECHNIQUE: Ultrasound examination of the renal transplant was performed with gray-scale, color and duplex  doppler evaluation. COMPARISON:  CT dated September 24, 2018 FINDINGS: Transplant kidney location: RLQ Transplant Kidney: Renal measurements: 11.6 x 4.7 x 6.3 cm = volume: 170mL. The echogenicity is within normal limits. There is no hydronephrosis. There appears to be a small calcification in the upper pole measuring approximately 0.7 cm. Color flow in the main renal artery:  Yes Color flow in the main renal vein:  Yes Duplex Doppler Evaluation: Main Renal Artery Resistive Index: 0.76 Venous waveform in main renal vein:  Present Intrarenal resistive index in upper pole:  0.69 (normal 0.6-0.8; equivocal 0.8-0.9; abnormal >= 0.9) Intrarenal resistive index in lower pole: 0.55 (normal 0.6-0.8; equivocal 0.8-0.9; abnormal >= 0.9) Bladder: Normal for degree of bladder distention. Other findings:  None. IMPRESSION: 1. Right lower quadrant pelvic transplant kidney with no evidence of hydronephrosis. 2. Normal resistive indices as detailed above. 3. Possible nonobstructing 7 mm stone in the upper pole of the transplant kidney. Electronically Signed   By: Constance Holster M.D.   On: 09/25/2018 21:30   Dg Chest Port 1 View  Result Date: 09/23/2018 CLINICAL DATA:  63 year old male with sepsis. EXAM: PORTABLE CHEST 1 VIEW COMPARISON:  Chest radiograph dated 01/22/2017 and CT dated 06/22/2014 FINDINGS: There is mild eventration of the left hemidiaphragm. There is no focal consolidation, pleural effusion, or pneumothorax. Mild prominence of the pulmonary vasculature with cephalization may represent mild congestion. Apparent 9 mm nodular density over the posterior right sixth rib likely an area of old healed fracture. A pulmonary nodule is less likely. Nonemergent CT may provide better evaluation if clinically indicated. Additional old healed right posterior rib fractures  noted. There is a 5 mm nodular density over the left mid lung field, possibly a granuloma. The cardiac silhouette is within normal limits. There is  dextroscoliosis of the thoracic spine. No acute osseous pathology. IMPRESSION: 1. No focal consolidation. 2. Possible mild vascular congestion. 3. Probable focus of old healed rib fracture versus less likely a 9 mm right pulmonary nodule. CT may provide better evaluation on a nonemergent basis. Electronically Signed   By: Anner Crete M.D.   On: 09/23/2018 19:11       Subjective: In distress with abdominal pain.  Continues to be tachycardic.  Discharge Exam: Vitals:   09/29/18 0300 09/29/18 0747  BP: (!) 142/76 (!) 91/43  Pulse:  (!) 122  Resp: 18 20  Temp: (!) 97.2 F (36.2 C) 97.9 F (36.6 C)  SpO2: 96% 94%   Vitals:   09/28/18 0518 09/28/18 1955 09/29/18 0300 09/29/18 0747  BP: 128/81 139/86 (!) 142/76 (!) 91/43  Pulse: (!) 105 (!) 110  (!) 122  Resp:   18 20  Temp: (!) 97.5 F (36.4 C) (!) 97.2 F (36.2 C) (!) 97.2 F (36.2 C) 97.9 F (36.6 C)  TempSrc: Oral  Oral Oral  SpO2: 99% 96% 96% 94%  Weight: 57.3 kg  56.4 kg   Height:        General: Fatigued, in distress with pain HEENT: Dry mucosa, supple neck Chest: Clear bilaterally S1-S2 tachycardic, no murmurs GI: Distended, tender with absent bowel sounds (abdominal distention and tenderness improved post sigmoidoscopy and decompression).  Still has some tenderness.  Rectal tube in place.  Foley + Musculoskeletal: Warm, no edema    The results of significant diagnostics from this hospitalization (including imaging, microbiology, ancillary and laboratory) are listed below for reference.     Microbiology: Recent Results (from the past 240 hour(s))  Blood Culture (routine x 2)     Status: Abnormal   Collection Time: 09/23/18  6:54 PM   Specimen: BLOOD  Result Value Ref Range Status   Specimen Description BLOOD SITE NOT SPECIFIED  Final   Special Requests   Final    BOTTLES DRAWN AEROBIC AND ANAEROBIC Blood Culture adequate volume   Culture  Setup Time   Final    GRAM POSITIVE COCCI IN PAIRS AND  CHAINS ANAEROBIC BOTTLE ONLY CRITICAL RESULT CALLED TO, READ BACK BY AND VERIFIED WITH: A. Love PharmD 14:20 09/24/18 ([email or web address]) Performed at [facility], [place] [address], [place], [place] [address]    Culture STREPTOCOCCUS ANGINOSIS (A)  Final   Report Status 09/26/2018 FINAL  Final   Organism ID, Bacteria STREPTOCOCCUS ANGINOSIS  Final      Susceptibility   Streptococcus anginosis - MIC*    PENICILLIN <=0.06 SENSITIVE Sensitive     CEFTRIAXONE 0.5 SENSITIVE Sensitive     ERYTHROMYCIN <=0.12 SENSITIVE Sensitive     LEVOFLOXACIN 0.5 SENSITIVE Sensitive     VANCOMYCIN 1 SENSITIVE Sensitive     * STREPTOCOCCUS ANGINOSIS  Blood Culture (routine x 2)     Status: None   Collection Time: 09/23/18  6:54 PM   Specimen: Site Not Specified; Blood  Result Value Ref Range Status   Specimen Description SITE NOT SPECIFIED  Final   Special Requests   Final    BOTTLES DRAWN AEROBIC ONLY Blood Culture adequate volume   Culture   Final    NO GROWTH 5 DAYS Performed at [facility], [place] [address] [place], [place] [address]    Report Status  09/28/2018 FINAL  Final  Blood Culture ID Panel (Reflexed)     Status: Abnormal   Collection Time: 09/23/18  6:54 PM  Result Value Ref Range Status   Enterococcus species NOT DETECTED NOT DETECTED Final   Listeria monocytogenes NOT DETECTED NOT DETECTED Final   Staphylococcus species NOT DETECTED NOT DETECTED Final   Staphylococcus aureus (BCID) NOT DETECTED NOT DETECTED Final   Streptococcus species DETECTED (A) NOT DETECTED Final    Comment: Not Enterococcus species, Streptococcus agalactiae, Streptococcus pyogenes, or Streptococcus pneumoniae. CRITICAL RESULT CALLED TO, READ BACK BY AND VERIFIED WITH: A. Love PharmD 14:20 09/24/18 ([email or web address])    Streptococcus agalactiae NOT DETECTED NOT DETECTED Final   Streptococcus pneumoniae NOT DETECTED NOT DETECTED Final   Streptococcus pyogenes NOT DETECTED NOT DETECTED Final   Acinetobacter baumannii NOT  DETECTED NOT DETECTED Final   Enterobacteriaceae species NOT DETECTED NOT DETECTED Final   Enterobacter cloacae complex NOT DETECTED NOT DETECTED Final   Escherichia coli NOT DETECTED NOT DETECTED Final   Klebsiella oxytoca NOT DETECTED NOT DETECTED Final   Klebsiella pneumoniae NOT DETECTED NOT DETECTED Final   Proteus species NOT DETECTED NOT DETECTED Final   Serratia marcescens NOT DETECTED NOT DETECTED Final   Haemophilus influenzae NOT DETECTED NOT DETECTED Final   Neisseria meningitidis NOT DETECTED NOT DETECTED Final   Pseudomonas aeruginosa NOT DETECTED NOT DETECTED Final   Candida albicans NOT DETECTED NOT DETECTED Final   Candida glabrata NOT DETECTED NOT DETECTED Final   Candida krusei NOT DETECTED NOT DETECTED Final   Candida parapsilosis NOT DETECTED NOT DETECTED Final   Candida tropicalis NOT DETECTED NOT DETECTED Final    Comment: Performed at [facility], [place] [address] [place], [place] [address]  SARS Coronavirus 2 (CEPHEID - Performed in [place] hospital lab), Hosp Order     Status: None   Collection Time: 09/23/18  7:04 PM   Specimen: Nasopharyngeal Swab  Result Value Ref Range Status   SARS Coronavirus 2 NEGATIVE NEGATIVE Final    Comment: (NOTE) If result is NEGATIVE SARS-CoV-2 target nucleic acids are NOT DETECTED. The SARS-CoV-2 RNA is generally detectable in upper and lower  respiratory specimens during the acute phase of infection. The lowest  concentration of SARS-CoV-2 viral copies this assay can detect is 250  copies / mL. A negative result does not preclude SARS-CoV-2 infection  and should not be used as the sole basis for treatment or other  patient management decisions.  A negative result may occur with  improper specimen collection / handling, submission of specimen other  than nasopharyngeal swab, presence of viral mutation(s) within the  areas targeted by this assay, and inadequate number of viral copies  (<250 copies / mL). A negative  result must be combined with clinical  observations, patient history, and epidemiological information. If result is POSITIVE SARS-CoV-2 target nucleic acids are DETECTED. The SARS-CoV-2 RNA is generally detectable in upper and lower  respiratory specimens dur ing the acute phase of infection.  Positive  results are indicative of active infection with SARS-CoV-2.  Clinical  correlation with patient history and other diagnostic information is  necessary to determine patient infection status.  Positive results do  not rule out bacterial infection or co-infection with other viruses. If result is PRESUMPTIVE POSTIVE SARS-CoV-2 nucleic acids MAY BE PRESENT.   A presumptive positive result was obtained on the submitted specimen  and confirmed on repeat testing.  While 2019 novel coronavirus  (SARS-CoV-2) nucleic acids may be present in  the submitted sample  additional confirmatory testing may be necessary for epidemiological  and / or clinical management purposes  to differentiate between  SARS-CoV-2 and other Sarbecovirus currently known to infect humans.  If clinically indicated additional testing with an alternate test  methodology [phone]) is advised. The SARS-CoV-2 RNA is generally  detectable in upper and lower respiratory sp ecimens during the acute  phase of infection. The expected result is Negative. Fact Sheet for Patients:  StrictlyIdeas.no Fact Sheet for Healthcare Providers: BankingDealers.co.za This test is not yet approved or cleared by the [place] FDA and has been authorized for detection and/or diagnosis of SARS-CoV-2 by FDA under an Emergency Use Authorization (EUA).  This EUA will remain in effect (meaning this test can be used) for the duration of the COVID-19 declaration under Section 564(b)(1) of the Act, 21 U.S.C. section 360bbb-3(b)(1), unless the authorization is terminated or revoked sooner. Performed at [facility], [place] [address], [place], [place] [address]   Urine culture     Status: None   Collection Time: 09/23/18  8:35 PM   Specimen: Urine, Catheterized  Result Value Ref Range Status   Specimen Description URINE, CATHETERIZED  Final   Special Requests NONE  Final   Culture   Final    NO GROWTH Performed at [facility], [place] [address], [place], [place] [address]    Report Status 09/24/2018 FINAL  Final  C difficile quick scan w PCR reflex     Status: Abnormal   Collection Time: 09/23/18  8:35 PM   Specimen: Urine, Catheterized; Stool  Result Value Ref Range Status   C Diff antigen POSITIVE (A) NEGATIVE Final   C Diff toxin POSITIVE (A) NEGATIVE Final   C Diff interpretation Toxin producing C. difficile detected.  Final    Comment: CRITICAL RESULT CALLED TO, READ BACK BY AND VERIFIED WITH: A DENNIS RN 09/24/18 0015 JDW Performed at [facility], [address], [place], [place] [address]   Gastrointestinal Panel by PCR , Stool     Status: None   Collection Time: 09/23/18  8:35 PM   Specimen: Stool  Result Value Ref Range Status   Campylobacter species NOT DETECTED NOT DETECTED Final   Plesimonas shigelloides NOT DETECTED NOT DETECTED Final   Salmonella species NOT DETECTED NOT DETECTED Final   Yersinia enterocolitica NOT DETECTED NOT DETECTED Final   Vibrio species NOT DETECTED NOT DETECTED Final   Vibrio cholerae NOT DETECTED NOT DETECTED Final   Enteroaggregative E coli (EAEC) NOT DETECTED NOT DETECTED Final   Enteropathogenic E coli (EPEC) NOT DETECTED NOT DETECTED Final   Enterotoxigenic E coli (ETEC) NOT DETECTED NOT DETECTED Final   Shiga like toxin producing E coli (STEC) NOT DETECTED NOT DETECTED Final   Shigella/Enteroinvasive E coli (EIEC) NOT DETECTED NOT DETECTED Final   Cryptosporidium NOT DETECTED NOT DETECTED Final   Cyclospora cayetanensis NOT DETECTED NOT DETECTED Final   Entamoeba histolytica NOT DETECTED NOT DETECTED Final   Giardia  lamblia NOT DETECTED NOT DETECTED Final   Adenovirus F40/41 NOT DETECTED NOT DETECTED Final   Astrovirus NOT DETECTED NOT DETECTED Final   Norovirus GI/GII NOT DETECTED NOT DETECTED Final   Rotavirus A NOT DETECTED NOT DETECTED Final   Sapovirus (I, II, IV, and V) NOT DETECTED NOT DETECTED Final    Comment: Performed at [facility], [address] [place], [place] [address]  CSF culture     Status: None   Collection Time:  09/23/18 10:12 PM   Specimen: CSF; Cerebrospinal Fluid  Result Value Ref Range Status   Specimen Description CSF  Final   Special Requests NONE  Final   Gram Stain NO WBC SEEN NO ORGANISMS SEEN CYTOSPIN SMEAR   Final   Culture   Final    NO GROWTH 3 DAYS Performed at [facility], [place] [address], [place], [place] [address]    Report Status 09/27/2018 FINAL  Final  Culture, blood (routine x 2)     Status: None (Preliminary result)   Collection Time: 09/26/18 12:12 PM   Specimen: BLOOD RIGHT HAND  Result Value Ref Range Status   Specimen Description BLOOD RIGHT HAND  Final   Special Requests   Final    BOTTLES DRAWN AEROBIC ONLY Blood Culture adequate volume   Culture   Final    NO GROWTH 2 DAYS Performed at [facility], [place] [address] [place], [place] [address]    Report Status PENDING  Incomplete  Culture, blood (routine x 2)     Status: None (Preliminary result)   Collection Time: 09/26/18 12:25 PM   Specimen: BLOOD RIGHT HAND  Result Value Ref Range Status   Specimen Description BLOOD RIGHT HAND  Final   Special Requests   Final    BOTTLES DRAWN AEROBIC ONLY Blood Culture adequate volume   Culture   Final    NO GROWTH 2 DAYS Performed at [facility], [place] [address], [place], [place] [address]    Report Status PENDING  Incomplete     Labs: BNP (last 3 results) Recent Labs    09/23/18 1824  BNP 193.7*   Basic Metabolic Panel: Recent Labs  Lab 09/23/18 1854  09/25/18 0726 09/26/18 0229 09/27/18 0931  09/27/18 1444 09/28/18 0237  NA 133*   < > 133* 133* 134* 132* 131*  K 4.5   < > 4.3 3.7 3.4* 4.1 4.7  CL 101   < > 109 106 88* 98 99  CO2 16*   < > 13* 17* 35* 20* 19*  GLUCOSE 141*   < > 109* 213* 426* 147* 158*  BUN 74*   < > 91* 94* 82* 92* 96*  CREATININE 3.68*   < > 2.99* 3.11* 3.13* 3.75* 3.73*  CALCIUM 8.4*   < > 6.7* 6.5* 5.6* 6.7* 6.7*  MG 2.7*  --   --   --  2.2  --   --   PHOS  --   --   --   --  1.7*  --  3.8   < > = values in this interval not displayed.   Liver Function Tests: Recent Labs  Lab 09/23/18 1854 09/27/18 0931  AST 25  --   ALT 13  --   ALKPHOS 90  --   BILITOT 0.6  --   PROT 6.4*  --   ALBUMIN 2.3* 1.5*   Recent Labs  Lab 09/23/18 1854  LIPASE 19   No results for input(s): AMMONIA in the last 168 hours. CBC: Recent Labs  Lab 09/23/18 1854  09/25/18 0726 09/26/18 0229 09/26/18 0751 09/27/18 0931 09/28/18 0237  WBC 2.6*   < > 2.9* 1.6* 1.3* 1.3* 2.2*  NEUTROABS 1.1*  --   --   --  0.9*  --  1.4*  HGB 15.2   < > 12.9* 11.6* 12.4* 11.6* 12.4*  HCT 44.8   < > 36.8* 32.5* 34.9* 31.7* 35.1*  MCV 92.4   < >  88.7 86.4 86.4 85.9 87.3  PLT 273   < > 199 175 156 103* 86*   < > = values in this interval not displayed.   Cardiac Enzymes: No results for input(s): CKTOTAL, CKMB, CKMBINDEX, TROPONINI in the last 168 hours. BNP: Invalid input(s): POCBNP CBG: No results for input(s): GLUCAP in the last 168 hours. D-Dimer No results for input(s): DDIMER in the last 72 hours. Hgb A1c No results for input(s): HGBA1C in the last 72 hours. Lipid Profile No results for input(s): CHOL, HDL, LDLCALC, TRIG, CHOLHDL, LDLDIRECT in the last 72 hours. Thyroid function studies No results for input(s): TSH, T4TOTAL, T3FREE, THYROIDAB in the last 72 hours.  Invalid input(s): FREET3 Anemia work up No results for input(s): VITAMINB12, FOLATE, FERRITIN, TIBC, IRON, RETICCTPCT in the last 72 hours. Urinalysis    Component Value Date/Time   COLORURINE AMBER  (A) 09/26/2018 1500   APPEARANCEUR HAZY (A) 09/26/2018 1500   LABSPEC 1.023 09/26/2018 1500   PHURINE 5.0 09/26/2018 1500   GLUCOSEU NEGATIVE 09/26/2018 1500   GLUCOSEU NEG mg/dL 08/16/2006 2308   HGBUR NEGATIVE 09/26/2018 1500   BILIRUBINUR NEGATIVE 09/26/2018 1500   BILIRUBINUR neg 03/05/2014 1214   [place] 09/26/2018 1500   PROTEINUR 30 (A) 09/26/2018 1500   UROBILINOGEN 0.2 07/04/2014 0022   NITRITE NEGATIVE 09/26/2018 1500   LEUKOCYTESUR TRACE (A) 09/26/2018 1500   Sepsis Labs Invalid input(s): PROCALCITONIN,  WBC,  LACTICIDVEN Microbiology Recent Results (from the past 240 hour(s))  Blood Culture (routine x 2)     Status: Abnormal   Collection Time: 09/23/18  6:54 PM   Specimen: BLOOD  Result Value Ref Range Status   Specimen Description BLOOD SITE NOT SPECIFIED  Final   Special Requests   Final    BOTTLES DRAWN AEROBIC AND ANAEROBIC Blood Culture adequate volume   Culture  Setup Time   Final    GRAM POSITIVE COCCI IN PAIRS AND CHAINS ANAEROBIC BOTTLE ONLY CRITICAL RESULT CALLED TO, READ BACK BY AND VERIFIED WITH: A. Love PharmD 14:20 09/24/18 ([email or web address]) Performed at [facility], [place] [address], [place], [place] [address]    Culture STREPTOCOCCUS ANGINOSIS (A)  Final   Report Status 09/26/2018 FINAL  Final   Organism ID, Bacteria STREPTOCOCCUS ANGINOSIS  Final      Susceptibility   Streptococcus anginosis - MIC*    PENICILLIN <=0.06 SENSITIVE Sensitive     CEFTRIAXONE 0.5 SENSITIVE Sensitive     ERYTHROMYCIN <=0.12 SENSITIVE Sensitive     LEVOFLOXACIN 0.5 SENSITIVE Sensitive     VANCOMYCIN 1 SENSITIVE Sensitive     * STREPTOCOCCUS ANGINOSIS  Blood Culture (routine x 2)     Status: None   Collection Time: 09/23/18  6:54 PM   Specimen: Site Not Specified; Blood  Result Value Ref Range Status   Specimen Description SITE NOT SPECIFIED  Final   Special Requests   Final    BOTTLES DRAWN AEROBIC ONLY Blood Culture adequate volume   Culture   Final     NO GROWTH 5 DAYS Performed at [facility], [place] [address] [place],  [address]    Report Status 09/28/2018 FINAL  Final  Blood Culture ID Panel (Reflexed)     Status: Abnormal   Collection Time: 09/23/18  6:54 PM  Result Value Ref Range Status   Enterococcus species NOT DETECTED NOT DETECTED Final   Listeria monocytogenes NOT DETECTED NOT DETECTED Final   Staphylococcus species NOT DETECTED NOT DETECTED Final   Staphylococcus  aureus (BCID) NOT DETECTED NOT DETECTED Final   Streptococcus species DETECTED (A) NOT DETECTED Final    Comment: Not Enterococcus species, Streptococcus agalactiae, Streptococcus pyogenes, or Streptococcus pneumoniae. CRITICAL RESULT CALLED TO, READ BACK BY AND VERIFIED WITH: A. Love PharmD 14:20 09/24/18 ([email or web address])    Streptococcus agalactiae NOT DETECTED NOT DETECTED Final   Streptococcus pneumoniae NOT DETECTED NOT DETECTED Final   Streptococcus pyogenes NOT DETECTED NOT DETECTED Final   Acinetobacter baumannii NOT DETECTED NOT DETECTED Final   Enterobacteriaceae species NOT DETECTED NOT DETECTED Final   Enterobacter cloacae complex NOT DETECTED NOT DETECTED Final   Escherichia coli NOT DETECTED NOT DETECTED Final   Klebsiella oxytoca NOT DETECTED NOT DETECTED Final   Klebsiella pneumoniae NOT DETECTED NOT DETECTED Final   Proteus species NOT DETECTED NOT DETECTED Final   Serratia marcescens NOT DETECTED NOT DETECTED Final   Haemophilus influenzae NOT DETECTED NOT DETECTED Final   Neisseria meningitidis NOT DETECTED NOT DETECTED Final   Pseudomonas aeruginosa NOT DETECTED NOT DETECTED Final   Candida albicans NOT DETECTED NOT DETECTED Final   Candida glabrata NOT DETECTED NOT DETECTED Final   Candida krusei NOT DETECTED NOT DETECTED Final   Candida parapsilosis NOT DETECTED NOT DETECTED Final   Candida tropicalis NOT DETECTED NOT DETECTED Final    Comment: Performed at [facility], [place] [address] [place], [place] [address]  SARS  Coronavirus 2 (CEPHEID - Performed in [place] hospital lab), Hosp Order     Status: None   Collection Time: 09/23/18  7:04 PM   Specimen: Nasopharyngeal Swab  Result Value Ref Range Status   SARS Coronavirus 2 NEGATIVE NEGATIVE Final    Comment: (NOTE) If result is NEGATIVE SARS-CoV-2 target nucleic acids are NOT DETECTED. The SARS-CoV-2 RNA is generally detectable in upper and lower  respiratory specimens during the acute phase of infection. The lowest  concentration of SARS-CoV-2 viral copies this assay can detect is 250  copies / mL. A negative result does not preclude SARS-CoV-2 infection  and should not be used as the sole basis for treatment or other  patient management decisions.  A negative result may occur with  improper specimen collection / handling, submission of specimen other  than nasopharyngeal swab, presence of viral mutation(s) within the  areas targeted by this assay, and inadequate number of viral copies  (<250 copies / mL). A negative result must be combined with clinical  observations, patient history, and epidemiological information. If result is POSITIVE SARS-CoV-2 target nucleic acids are DETECTED. The SARS-CoV-2 RNA is generally detectable in upper and lower  respiratory specimens dur ing the acute phase of infection.  Positive  results are indicative of active infection with SARS-CoV-2.  Clinical  correlation with patient history and other diagnostic information is  necessary to determine patient infection status.  Positive results do  not rule out bacterial infection or co-infection with other viruses. If result is PRESUMPTIVE POSTIVE SARS-CoV-2 nucleic acids MAY BE PRESENT.   A presumptive positive result was obtained on the submitted specimen  and confirmed on repeat testing.  While 2019 novel coronavirus  (SARS-CoV-2) nucleic acids may be present in the submitted sample  additional confirmatory testing may be necessary for epidemiological  and / or  clinical management purposes  to differentiate between  SARS-CoV-2 and other Sarbecovirus currently known to infect humans.  If clinically indicated additional testing with an alternate test  methodology [phone] is advised. The SARS-CoV-2 RNA is generally  detectable in upper and lower respiratory  sp ecimens during the acute  phase of infection. The expected result is Negative. Fact Sheet for Patients:  StrictlyIdeas.no Fact Sheet for Healthcare Providers: BankingDealers.co.za This test is not yet approved or cleared by the [place] FDA and has been authorized for detection and/or diagnosis of SARS-CoV-2 by FDA under an Emergency Use Authorization (EUA).  This EUA will remain in effect (meaning this test can be used) for the duration of the COVID-19 declaration under Section 564(b)(1) of the Act, 21 U.S.C. section 360bbb-3(b)(1), unless the authorization is terminated or revoked sooner. Performed at [facility], [place] [address], [place], [place] [address]   Urine culture     Status: None   Collection Time: 09/23/18  8:35 PM   Specimen: Urine, Catheterized  Result Value Ref Range Status   Specimen Description URINE, CATHETERIZED  Final   Special Requests NONE  Final   Culture   Final    NO GROWTH Performed at [facility], [place] [address] [place], [place] [address]    Report Status 09/24/2018 FINAL  Final  C difficile quick scan w PCR reflex     Status: Abnormal   Collection Time: 09/23/18  8:35 PM   Specimen: Urine, Catheterized; Stool  Result Value Ref Range Status   C Diff antigen POSITIVE (A) NEGATIVE Final   C Diff toxin POSITIVE (A) NEGATIVE Final   C Diff interpretation Toxin producing C. difficile detected.  Final    Comment: CRITICAL RESULT CALLED TO, READ BACK BY AND VERIFIED WITH: A DENNIS RN 09/24/18 0015 JDW Performed at [facility], [address] [place], [place] [address]    Gastrointestinal Panel by PCR , Stool     Status: None   Collection Time: 09/23/18  8:35 PM   Specimen: Stool  Result Value Ref Range Status   Campylobacter species NOT DETECTED NOT DETECTED Final   Plesimonas shigelloides NOT DETECTED NOT DETECTED Final   Salmonella species NOT DETECTED NOT DETECTED Final   Yersinia enterocolitica NOT DETECTED NOT DETECTED Final   Vibrio species NOT DETECTED NOT DETECTED Final   Vibrio cholerae NOT DETECTED NOT DETECTED Final   Enteroaggregative E coli (EAEC) NOT DETECTED NOT DETECTED Final   Enteropathogenic E coli (EPEC) NOT DETECTED NOT DETECTED Final   Enterotoxigenic E coli (ETEC) NOT DETECTED NOT DETECTED Final   Shiga like toxin producing E coli (STEC) NOT DETECTED NOT DETECTED Final   Shigella/Enteroinvasive E coli (EIEC) NOT DETECTED NOT DETECTED Final   Cryptosporidium NOT DETECTED NOT DETECTED Final   Cyclospora cayetanensis NOT DETECTED NOT DETECTED Final   Entamoeba histolytica NOT DETECTED NOT DETECTED Final   Giardia lamblia NOT DETECTED NOT DETECTED Final   Adenovirus F40/41 NOT DETECTED NOT DETECTED Final   Astrovirus NOT DETECTED NOT DETECTED Final   Norovirus GI/GII NOT DETECTED NOT DETECTED Final   Rotavirus A NOT DETECTED NOT DETECTED Final   Sapovirus (I, II, IV, and V) NOT DETECTED NOT DETECTED Final    Comment: Performed at [facility], [place] [place], [place] [address]  CSF culture     Status: None   Collection Time: 09/23/18 10:12 PM   Specimen: CSF; Cerebrospinal Fluid  Result Value Ref Range Status   Specimen Description CSF  Final   Special Requests NONE  Final   Gram Stain NO WBC SEEN NO ORGANISMS SEEN CYTOSPIN SMEAR   Final   Culture   Final    NO GROWTH 3 DAYS Performed at [facility], [place] [address] [place], [place] [address]    Report Status 09/27/2018 FINAL  Final  Culture, blood (routine x 2)     Status: None (Preliminary result)   Collection Time: 09/26/18 12:12 PM    Specimen: BLOOD RIGHT HAND  Result Value Ref Range Status   Specimen Description BLOOD RIGHT HAND  Final   Special Requests   Final    BOTTLES DRAWN AEROBIC ONLY Blood Culture adequate volume   Culture   Final    NO GROWTH 2 DAYS Performed at [facility], [place] [address], [place], [place] [address]    Report Status PENDING  Incomplete  Culture, blood (routine x 2)     Status: None (Preliminary result)   Collection Time: 09/26/18 12:25 PM   Specimen: BLOOD RIGHT HAND  Result Value Ref Range Status   Specimen Description BLOOD RIGHT HAND  Final   Special Requests   Final    BOTTLES DRAWN AEROBIC ONLY Blood Culture adequate volume   Culture   Final    NO GROWTH 2 DAYS Performed at [facility], [place] [address] [place], [place] [address]    Report Status PENDING  Incomplete     Time coordinating discharge: 35 minutes  SIGNED:   Louellen Molder, MD  [facility] 09/29/2018, 8:32 AM Pager   If 7PM-7AM, please contact night-coverage [email or web address] Password [email or web address]

## 2018-09-29 NOTE — Progress Notes (Signed)
Received call from Anda Kraft, RN in [place] transfer center. She stated that they are expecting update from MD after procedure is completed/ before transfer since patient had change in condition. Notified MD Dhungel and primary nurse Velia.  MD aware, he will give an update as soon as possible.

## 2018-09-29 NOTE — Progress Notes (Signed)
Pt's suction canister output total of 1,000  Type 7 (liquid consistency, brown color). Suction canister changed.  I spoke with [name] MD regarding pt's rectal tube not in place when I reassessed pt after flexible sigmoidoscopy  performed at bedside.  Received instructions to place rectal tube back in, and that pt was going to be transported with rectal tube in place. Edwards MD aware that suction may not be place during transportation.    Charge nurse aware of MD orders and at bedside during reinsertion of rectal tube.   Julianne Handler, RN

## 2018-09-29 NOTE — Progress Notes (Signed)
Flexible sigmoidoscopy performed at bedside. Patient on enteric precautions. Fentanyl 61mcg and Versed 3mg  wasted at bedside with patient primary RN Janan Halter, RN.

## 2018-09-29 NOTE — Progress Notes (Addendum)
INFECTIOUS DISEASE PROGRESS NOTE  ID: Kenneth [name] is a 63 y.o. male with  Principal Problem:   Severe sepsis ([place]) Active Problems:   HIV disease ([place])   AKI (acute kidney injury) ([place])   SLE (systemic lupus erythematosus) ([facility])   History of renal transplant   Current chronic use of systemic steroids   C. difficile diarrhea   Leucopenia   Clostridium enterocolitis   ATN (acute tubular necrosis) ([facility])   Ileus ([facility])  Subjective: Pt having colonoscopy  Abtx:  Anti-infectives (From admission, onward)   Start     Dose/Rate Route Frequency Ordered Stop   09/29/18 0000  penicillin G potassium 4 Million Units in dextrose 5 % 250 mL     4 Million Units Intravenous Every 8 hours 09/29/18 0830     09/29/18 0000  metroNIDAZOLE (FLAGYL) 5-0.79 MG/ML-% IVPB     500 mg Intravenous Every 8 hours 09/29/18 0830     09/29/18 0000  vancomycin 500 mg in sodium chloride irrigation 0.9 % 100 mL     500 mg Rectal Every 6 hours 09/29/18 0830     09/27/18 1600  [MAR Hold]  vancomycin (VANCOCIN) 500 mg in sodium chloride irrigation 0.9 % 100 mL ENEMA     (MAR Hold since Sun 09/29/2018 at 1138.Hold Reason: Transfer to a Procedural area.)   500 mg Rectal Every 6 hours 09/27/18 1414     09/27/18 0900  sulfamethoxazole-trimethoprim (BACTRIM DS) 800-160 MG per tablet 1 tablet  Status:  Discontinued     1 tablet Oral 3 times weekly 09/26/18 1524 09/28/18 0904   09/26/18 2000  [MAR Hold]  penicillin G potassium 4 Million Units in dextrose 5 % 250 mL IVPB     (MAR Hold since Sun 09/29/2018 at 1138.Hold Reason: Transfer to a Procedural area.)   4 Million Units 250 mL/hr over 60 Minutes Intravenous Every 8 hours 09/26/18 1541 09/30/18 2359   09/26/18 1400  cefTRIAXone (ROCEPHIN) 2 g in sodium chloride 0.9 % 100 mL IVPB  Status:  Discontinued     2 g 200 mL/hr over 30 Minutes Intravenous Every 24 hours 09/26/18 1225 09/26/18 1541   09/25/18 1100  ceFEPIme (MAXIPIME) 2 g in sodium chloride 0.9 % 100 mL IVPB   Status:  Discontinued     2 g 200 mL/hr over 30 Minutes Intravenous Every 24 hours 09/25/18 1007 09/26/18 1225   09/25/18 1030  [MAR Hold]  acyclovir (ZOVIRAX) tablet 200 mg     (MAR Hold since Sun 09/29/2018 at 1138.Hold Reason: Transfer to a Procedural area.)   200 mg Oral 3 times daily 09/25/18 1007     09/25/18 1000  sulfamethoxazole-trimethoprim (BACTRIM DS) 800-160 MG per tablet 1 tablet  Status:  Discontinued     1 tablet Oral Every M-W-F 09/24/18 0045 09/25/18 0930   09/24/18 1900  ceFEPIme (MAXIPIME) 1 g in sodium chloride 0.9 % 100 mL IVPB  Status:  Discontinued     1 g 200 mL/hr over 30 Minutes Intravenous Every 24 hours 09/23/18 1946 09/24/18 0115   09/24/18 1000  [MAR Hold]  hydroxychloroquine (PLAQUENIL) tablet 200 mg     (MAR Hold since Sun 09/29/2018 at 1138.Hold Reason: Transfer to a Procedural area.)   200 mg Oral 2 times daily 09/24/18 0045     09/24/18 1000  [MAR Hold]  abacavir-dolutegravir-lamiVUDine (TRIUMEQ) 600-50-300 MG per tablet 1 tablet     (MAR Hold since Sun 09/29/2018 at 1138.Hold Reason: Transfer to a Procedural area.)  1 tablet Oral Daily 09/24/18 0045     09/24/18 0600  [MAR Hold]  vancomycin (VANCOCIN) 50 mg/mL oral solution 500 mg     (MAR Hold since Sun 09/29/2018 at 1138.Hold Reason: Transfer to a Procedural area.)   500 mg Oral 3 times daily before meals & bedtime 09/24/18 0055 10/08/18 0759   09/24/18 0200  [MAR Hold]  metroNIDAZOLE (FLAGYL) IVPB 500 mg     (MAR Hold since Sun 09/29/2018 at 1138.Hold Reason: Transfer to a Procedural area.)   500 mg 100 mL/hr over 60 Minutes Intravenous Every 8 hours 09/24/18 0055 10/08/18 0159   09/23/18 1900  ceFEPIme (MAXIPIME) 2 g in sodium chloride 0.9 % 100 mL IVPB     2 g 200 mL/hr over 30 Minutes Intravenous  Once 09/23/18 1856 09/23/18 2011   09/23/18 1900  vancomycin (VANCOCIN) IVPB 1000 mg/200 mL premix     1,000 mg 200 mL/hr over 60 Minutes Intravenous  Once 09/23/18 1856 09/23/18 2144      Medications:   Scheduled: . [MAR Hold] abacavir-dolutegravir-lamiVUDine  1 tablet Oral Daily  . [MAR Hold] acyclovir  200 mg Oral TID  . [MAR Hold] aspirin  81 mg Oral Daily  . [MAR Hold] cholecalciferol  2,000 Units Oral Daily  . [MAR Hold] feeding supplement (ENSURE ENLIVE)  237 mL Oral TID BM  . [MAR Hold] hydroxychloroquine  200 mg Oral BID  . [MAR Hold] magic mouthwash  5 mL Oral TID  . [MAR Hold] multivitamin with minerals  1 tablet Oral Daily  . [MAR Hold] nystatin  5 mL Oral QID  . [MAR Hold] predniSONE  10 mg Oral Daily  . [MAR Hold] tacrolimus  0.5 mg Oral BID  . [MAR Hold] tamsulosin  0.4 mg Oral QHS  . [MAR Hold] vancomycin  500 mg Oral TID AC & HS  . [MAR Hold] vancomycin (VANCOCIN) rectal ENEMA  500 mg Rectal Q6H    Objective: Vital signs in last 24 hours: Temp:  [97.2 F (36.2 C)-97.9 F (36.6 C)] 97.7 F (36.5 C) (06/28 0947) Pulse Rate:  [89-128] 118 (06/28 0947) Resp:  [18-20] 20 (06/28 0947) BP: (91-142)/(43-87) 115/73 (06/28 0947) SpO2:  [79 %-96 %] 95 % (06/28 0947) Weight:  [56.4 kg] 56.4 kg (06/28 0300)  sedated Pt having colonoscopy- pseudomembranes, no bleeding.   Lab Results Recent Labs    09/27/18 0931  09/28/18 0237 09/29/18 0830  WBC 1.3*  --  2.2*  --   HGB 11.6*  --  12.4*  --   HCT 31.7*  --  35.1*  --   NA 134*   < > 131* 131*  K 3.4*   < > 4.7 4.9  CL 88*   < > 99 99  CO2 35*   < > 19* 18*  BUN 82*   < > 96* 89*  CREATININE 3.13*   < > 3.73* 4.13*   < > = values in this interval not displayed.   Liver Panel Recent Labs    09/27/18 0931 09/29/18 0830  ALBUMIN 1.5* 1.5*   Sedimentation Rate No results for input(s): ESRSEDRATE in the last 72 hours. C-Reactive Protein No results for input(s): CRP in the last 72 hours.  Microbiology: Recent Results (from the past 240 hour(s))  Blood Culture (routine x 2)     Status: Abnormal   Collection Time: 09/23/18  6:54 PM   Specimen: BLOOD  Result Value Ref Range Status   Specimen Description  BLOOD SITE NOT SPECIFIED  Final   Special Requests   Final    BOTTLES DRAWN AEROBIC AND ANAEROBIC Blood Culture adequate volume   Culture  Setup Time   Final    GRAM POSITIVE COCCI IN PAIRS AND CHAINS ANAEROBIC BOTTLE ONLY CRITICAL RESULT CALLED TO, READ BACK BY AND VERIFIED WITH: A. Love PharmD 14:20 09/24/18 ([email or web address]) Performed at [facility], [place] [address], [place], [place] [address]    Culture STREPTOCOCCUS ANGINOSIS (A)  Final   Report Status 09/26/2018 FINAL  Final   Organism ID, Bacteria STREPTOCOCCUS ANGINOSIS  Final      Susceptibility   Streptococcus anginosis - MIC*    PENICILLIN <=0.06 SENSITIVE Sensitive     CEFTRIAXONE 0.5 SENSITIVE Sensitive     ERYTHROMYCIN <=0.12 SENSITIVE Sensitive     LEVOFLOXACIN 0.5 SENSITIVE Sensitive     VANCOMYCIN 1 SENSITIVE Sensitive     * STREPTOCOCCUS ANGINOSIS  Blood Culture (routine x 2)     Status: None   Collection Time: 09/23/18  6:54 PM   Specimen: Site Not Specified; Blood  Result Value Ref Range Status   Specimen Description SITE NOT SPECIFIED  Final   Special Requests   Final    BOTTLES DRAWN AEROBIC ONLY Blood Culture adequate volume   Culture   Final    NO GROWTH 5 DAYS Performed at [facility], [place] [address], [place], [place] [address]    Report Status 09/28/2018 FINAL  Final  Blood Culture ID Panel (Reflexed)     Status: Abnormal   Collection Time: 09/23/18  6:54 PM  Result Value Ref Range Status   Enterococcus species NOT DETECTED NOT DETECTED Final   Listeria monocytogenes NOT DETECTED NOT DETECTED Final   Staphylococcus species NOT DETECTED NOT DETECTED Final   Staphylococcus aureus (BCID) NOT DETECTED NOT DETECTED Final   Streptococcus species DETECTED (A) NOT DETECTED Final    Comment: Not Enterococcus species, Streptococcus agalactiae, Streptococcus pyogenes, or Streptococcus pneumoniae. CRITICAL RESULT CALLED TO, READ BACK BY AND VERIFIED WITH: A. Love PharmD 14:20 09/24/18 ([email or web address])     Streptococcus agalactiae NOT DETECTED NOT DETECTED Final   Streptococcus pneumoniae NOT DETECTED NOT DETECTED Final   Streptococcus pyogenes NOT DETECTED NOT DETECTED Final   Acinetobacter baumannii NOT DETECTED NOT DETECTED Final   Enterobacteriaceae species NOT DETECTED NOT DETECTED Final   Enterobacter cloacae complex NOT DETECTED NOT DETECTED Final   Escherichia coli NOT DETECTED NOT DETECTED Final   Klebsiella oxytoca NOT DETECTED NOT DETECTED Final   Klebsiella pneumoniae NOT DETECTED NOT DETECTED Final   Proteus species NOT DETECTED NOT DETECTED Final   Serratia marcescens NOT DETECTED NOT DETECTED Final   Haemophilus influenzae NOT DETECTED NOT DETECTED Final   Neisseria meningitidis NOT DETECTED NOT DETECTED Final   Pseudomonas aeruginosa NOT DETECTED NOT DETECTED Final   Candida albicans NOT DETECTED NOT DETECTED Final   Candida glabrata NOT DETECTED NOT DETECTED Final   Candida krusei NOT DETECTED NOT DETECTED Final   Candida parapsilosis NOT DETECTED NOT DETECTED Final   Candida tropicalis NOT DETECTED NOT DETECTED Final    Comment: Performed at [facility], [place] [address] [place], [place] [address]  SARS Coronavirus 2 (CEPHEID - Performed in [place] hospital lab), Hosp Order     Status: None   Collection Time: 09/23/18  7:04 PM   Specimen: Nasopharyngeal Swab  Result Value Ref Range Status   SARS Coronavirus 2 NEGATIVE NEGATIVE Final    Comment: (NOTE) If result is NEGATIVE SARS-CoV-2  target nucleic acids are NOT DETECTED. The SARS-CoV-2 RNA is generally detectable in upper and lower  respiratory specimens during the acute phase of infection. The lowest  concentration of SARS-CoV-2 viral copies this assay can detect is 250  copies / mL. A negative result does not preclude SARS-CoV-2 infection  and should not be used as the sole basis for treatment or other  patient management decisions.  A negative result may occur with  improper specimen collection /  handling, submission of specimen other  than nasopharyngeal swab, presence of viral mutation(s) within the  areas targeted by this assay, and inadequate number of viral copies  (<250 copies / mL). A negative result must be combined with clinical  observations, patient history, and epidemiological information. If result is POSITIVE SARS-CoV-2 target nucleic acids are DETECTED. The SARS-CoV-2 RNA is generally detectable in upper and lower  respiratory specimens dur ing the acute phase of infection.  Positive  results are indicative of active infection with SARS-CoV-2.  Clinical  correlation with patient history and other diagnostic information is  necessary to determine patient infection status.  Positive results do  not rule out bacterial infection or co-infection with other viruses. If result is PRESUMPTIVE POSTIVE SARS-CoV-2 nucleic acids MAY BE PRESENT.   A presumptive positive result was obtained on the submitted specimen  and confirmed on repeat testing.  While 2019 novel coronavirus  (SARS-CoV-2) nucleic acids may be present in the submitted sample  additional confirmatory testing may be necessary for epidemiological  and / or clinical management purposes  to differentiate between  SARS-CoV-2 and other Sarbecovirus currently known to infect humans.  If clinically indicated additional testing with an alternate test  methodology [phone]) is advised. The SARS-CoV-2 RNA is generally  detectable in upper and lower respiratory sp ecimens during the acute  phase of infection. The expected result is Negative. Fact Sheet for Patients:  StrictlyIdeas.no Fact Sheet for Healthcare Providers: BankingDealers.co.za This test is not yet approved or cleared by the [place] FDA and has been authorized for detection and/or diagnosis of SARS-CoV-2 by FDA under an Emergency Use Authorization (EUA).  This EUA will remain in effect (meaning this  test can be used) for the duration of the COVID-19 declaration under Section 564(b)(1) of the Act, 21 U.S.C. section 360bbb-3(b)(1), unless the authorization is terminated or revoked sooner. Performed at [facility], [place] [address], [name], [place] [address]   Urine culture     Status: None   Collection Time: 09/23/18  8:35 PM   Specimen: Urine, Catheterized  Result Value Ref Range Status   Specimen Description URINE, CATHETERIZED  Final   Special Requests NONE  Final   Culture   Final    NO GROWTH Performed at [facility], [place] [address] [place], [place] [address]    Report Status 09/24/2018 FINAL  Final  C difficile quick scan w PCR reflex     Status: Abnormal   Collection Time: 09/23/18  8:35 PM   Specimen: Urine, Catheterized; Stool  Result Value Ref Range Status   C Diff antigen POSITIVE (A) NEGATIVE Final   C Diff toxin POSITIVE (A) NEGATIVE Final   C Diff interpretation Toxin producing C. difficile detected.  Final    Comment: CRITICAL RESULT CALLED TO, READ BACK BY AND VERIFIED WITH: A DENNIS RN 09/24/18 0015 JDW Performed at [facility], [address] [place], [place] [address]   Gastrointestinal Panel by PCR , Stool  Status: None   Collection Time: 09/23/18  8:35 PM   Specimen: Stool  Result Value Ref Range Status   Campylobacter species NOT DETECTED NOT DETECTED Final   Plesimonas shigelloides NOT DETECTED NOT DETECTED Final   Salmonella species NOT DETECTED NOT DETECTED Final   Yersinia enterocolitica NOT DETECTED NOT DETECTED Final   Vibrio species NOT DETECTED NOT DETECTED Final   Vibrio cholerae NOT DETECTED NOT DETECTED Final   Enteroaggregative E coli (EAEC) NOT DETECTED NOT DETECTED Final   Enteropathogenic E coli (EPEC) NOT DETECTED NOT DETECTED Final   Enterotoxigenic E coli (ETEC) NOT DETECTED NOT DETECTED Final   Shiga like toxin producing E coli (STEC) NOT DETECTED NOT DETECTED Final   Shigella/Enteroinvasive E coli (EIEC)  NOT DETECTED NOT DETECTED Final   Cryptosporidium NOT DETECTED NOT DETECTED Final   Cyclospora cayetanensis NOT DETECTED NOT DETECTED Final   Entamoeba histolytica NOT DETECTED NOT DETECTED Final   Giardia lamblia NOT DETECTED NOT DETECTED Final   Adenovirus F40/41 NOT DETECTED NOT DETECTED Final   Astrovirus NOT DETECTED NOT DETECTED Final   Norovirus GI/GII NOT DETECTED NOT DETECTED Final   Rotavirus A NOT DETECTED NOT DETECTED Final   Sapovirus (I, II, IV, and V) NOT DETECTED NOT DETECTED Final    Comment: Performed at [facility], [place] [place], [place] [address]  CSF culture     Status: None   Collection Time: 09/23/18 10:12 PM   Specimen: CSF; Cerebrospinal Fluid  Result Value Ref Range Status   Specimen Description CSF  Final   Special Requests NONE  Final   Gram Stain NO WBC SEEN NO ORGANISMS SEEN CYTOSPIN SMEAR   Final   Culture   Final    NO GROWTH 3 DAYS Performed at [facility], [address] [place], [place] [address]    Report Status 09/27/2018 FINAL  Final  Culture, blood (routine x 2)     Status: None (Preliminary result)   Collection Time: 09/26/18 12:12 PM   Specimen: BLOOD RIGHT HAND  Result Value Ref Range Status   Specimen Description BLOOD RIGHT HAND  Final   Special Requests   Final    BOTTLES DRAWN AEROBIC ONLY Blood Culture adequate volume   Culture   Final    NO GROWTH 3 DAYS Performed at [facility], [place] [address], [place], [place] [address]    Report Status PENDING  Incomplete  Culture, blood (routine x 2)     Status: None (Preliminary result)   Collection Time: 09/26/18 12:25 PM   Specimen: BLOOD RIGHT HAND  Result Value Ref Range Status   Specimen Description BLOOD RIGHT HAND  Final   Special Requests   Final    BOTTLES DRAWN AEROBIC ONLY Blood Culture adequate volume   Culture   Final    NO GROWTH 3 DAYS Performed at [facility], [place] [address], [place],  [address]    Report Status  PENDING  Incomplete    Studies/Results: Dg Abd Portable 2v  Result Date: 09/29/2018 CLINICAL DATA:  64 year old male with a history of ileus EXAM: PORTABLE ABDOMEN - 2 VIEW COMPARISON:  CT 09/26/2018 FINDINGS: A kidney bean shaped distended bowel loop persist in the lower abdomen, with the axis directed towards the left upper quadrant. Gaseous distention of small bowel with relative decompression of the stomach. Surgical clips in the upper abdomen. Absence of rectal gas. No displaced fracture. IMPRESSION: Plain-film diagnosis of sigmoid volvulus. Urgent/emergent follow-up with  gastroenterology and/or surgery is indicated. These results were discussed by telephone at the time of interpretation on 09/29/2018 at 9:56 am with Dr. Louellen Molder Electronically Signed   By: Corrie Mckusick D.O.   On: 09/29/2018 09:56     Assessment/Plan: AIDS ESRD, renal txp 04-2018  ARF C diff, pancolitis Sigmoid volvulus Group A strep bacteremia (6-22), repeat BCx 6-25 ngtd  Total days of antibiotics: 5 (Pen G 3) 2 vanco enema; IV flagyl/po vanco triumeq Prednisone 10mg /tacrolimus/plaquenil        His Cr continues to worsen.  Will stop Pen G, bactrim stopped yesterday Could consider IV vanco in addition to po, pr and IV flagyl however given his renal txp and worsening renal function will hold on this.  Appreciate GI seeing him  Surgery is following as well.  Transfer to [facility] is on hold at this time due to above.          Bobby Rumpf MD, FACP Infectious Diseases (pager) [phone] [email or web address] 09/29/2018, 11:38 AM  LOS: 5 days

## 2018-09-29 NOTE — Progress Notes (Addendum)
Contacted MD and asked should we send any CT/Radilogy report with patient/care link staff. Per MD Dhungel N they will have access to patients record by care everywhere, no need to send parperwork. Notified primary nurse Christella Hartigan.

## 2018-10-01 ENCOUNTER — Telehealth: Payer: Self-pay | Admitting: *Deleted

## 2018-10-01 LAB — CULTURE, BLOOD (ROUTINE X 2)
Culture: NO GROWTH
Culture: NO GROWTH
Special Requests: ADEQUATE
Special Requests: ADEQUATE

## 2018-10-01 LAB — TACROLIMUS LEVEL: Tacrolimus (FK506) - LabCorp: 18 ng/mL (ref 2.0–20.0)

## 2018-10-01 MED ORDER — GENERIC EXTERNAL MEDICATION
Status: DC
Start: ? — End: 2018-10-01

## 2018-10-01 MED ORDER — FAMOTIDINE 20 MG/2ML IV SOLN
20.00 | INTRAVENOUS | Status: DC
Start: 2018-10-01 — End: 2018-10-01

## 2018-10-01 MED ORDER — GENERIC EXTERNAL MEDICATION
1.00 | Status: DC
Start: 2018-10-01 — End: 2018-10-01

## 2018-10-01 MED ORDER — VANCOMYCIN HCL 50 MG/ML PO SOLR
500.00 | ORAL | Status: DC
Start: 2018-10-01 — End: 2018-10-01

## 2018-10-01 MED ORDER — LIDOCAINE HCL 1 % IJ SOLN
.50 | INTRAMUSCULAR | Status: DC
Start: ? — End: 2018-10-01

## 2018-10-01 MED ORDER — HYDROMORPHONE HCL 1 MG/ML IJ SOLN
.50 | INTRAMUSCULAR | Status: DC
Start: ? — End: 2018-10-01

## 2018-10-01 MED ORDER — GENERIC EXTERNAL MEDICATION
100.00 | Status: DC
Start: 2018-10-02 — End: 2018-10-01

## 2018-10-01 MED ORDER — OXYMETAZOLINE HCL 0.05 % NA SOLN
2.00 | NASAL | Status: DC
Start: ? — End: 2018-10-01

## 2018-10-01 MED ORDER — LIDOCAINE HCL 1 % IJ SOLN
3.00 | INTRAMUSCULAR | Status: DC
Start: ? — End: 2018-10-01

## 2018-10-01 MED ORDER — GENERIC EXTERNAL MEDICATION
.50 | Status: DC
Start: 2018-10-01 — End: 2018-10-01

## 2018-10-01 MED ORDER — ACETAMINOPHEN 10 MG/ML IV SOLN
1000.00 | INTRAVENOUS | Status: DC
Start: 2018-10-01 — End: 2018-10-01

## 2018-10-01 MED ORDER — METHYLPREDNISOLONE SODIUM SUCC 2000 MG IJ SOLR
10.00 | INTRAMUSCULAR | Status: DC
Start: 2018-10-02 — End: 2018-10-01

## 2018-10-01 MED ORDER — GENERIC EXTERNAL MEDICATION
200.00 | Status: DC
Start: ? — End: 2018-10-01

## 2018-10-01 MED ORDER — METRONIDAZOLE IN NACL 5-0.79 MG/ML-% IV SOLN
500.00 | INTRAVENOUS | Status: DC
Start: 2018-10-01 — End: 2018-10-01

## 2018-10-01 NOTE — Telephone Encounter (Signed)
-----   Message from [name], MD sent at 10/01/2018 11:26 AM EDT ----- Regarding: f/u appt This patient was in the hospital during his appt last week.  Can we call him today to see if he would be able to see me during my clinic tomorrow, or next week?    Thanks,   Linna Hoff

## 2018-10-01 NOTE — Telephone Encounter (Signed)
LVM for pt to call back to see about scheduling a visit since he was in hospital when his last appointment was scheduled.  Please assist pt in getting this scheduled if he calls back. Sheana Bir Zimmerman Rumple, CMA

## 2018-10-02 LAB — CMV DNA, QUANTITATIVE, PCR
CMV DNA Quant: NEGATIVE IU/mL
Log10 CMV Qn DNA Pl: UNDETERMINED log10 IU/mL

## 2018-10-03 NOTE — Telephone Encounter (Signed)
2nd attempt, LVM to call office back to see about scheduling an appointment per Dr. Jeannine Kitten. Kimetha Trulson Zimmerman Rumple, CMA

## 2018-10-15 NOTE — Telephone Encounter (Signed)
Tried to contact pt and phone said that my call could not be completed.  This is the 3rd attempt to reach pt, will send letter requesting pt to contact our office.  Letter sent via [place].  April Zimmerman Rumple, CMA

## 2018-10-22 ENCOUNTER — Encounter: Payer: Medicare Other | Admitting: Infectious Diseases

## 2018-10-23 ENCOUNTER — Encounter: Payer: Self-pay | Admitting: Student

## 2018-10-23 DIAGNOSIS — S72113A Displaced fracture of greater trochanter of unspecified femur, initial encounter for closed fracture: Secondary | ICD-10-CM | POA: Insufficient documentation

## 2018-12-04 ENCOUNTER — Non-Acute Institutional Stay (SKILLED_NURSING_FACILITY): Payer: Medicare Other | Admitting: Internal Medicine

## 2018-12-04 ENCOUNTER — Encounter: Payer: Self-pay | Admitting: Internal Medicine

## 2018-12-04 DIAGNOSIS — N179 Acute kidney failure, unspecified: Secondary | ICD-10-CM

## 2018-12-04 DIAGNOSIS — A0472 Enterocolitis due to Clostridium difficile, not specified as recurrent: Secondary | ICD-10-CM | POA: Diagnosis not present

## 2018-12-04 DIAGNOSIS — R627 Adult failure to thrive: Secondary | ICD-10-CM | POA: Diagnosis not present

## 2018-12-04 DIAGNOSIS — B20 Human immunodeficiency virus [HIV] disease: Secondary | ICD-10-CM

## 2018-12-04 NOTE — Assessment & Plan Note (Addendum)
6/28- 12/04/2018 immunosuppression adjusted at [facility].  CellCept discontinued and Prograf decreased to 0.5 mg twice daily and subsequently held due to a level of approximately 40. Immunosuppression at discharge was to include tacrolimus twice daily and prednisone 10 mg daily.  Follow-up 9/18 with Dr. Theda Sers.

## 2018-12-04 NOTE — Assessment & Plan Note (Signed)
09/30/2018 creatinine 4.3; 9/2 creatinine 1.3 felt to be near or at baseline

## 2018-12-04 NOTE — Assessment & Plan Note (Addendum)
ST & Nutrition consult at SNF. PT/OT as tolerated.

## 2018-12-04 NOTE — Assessment & Plan Note (Addendum)
Monitor for diarrhea recurrence. I stressed to whom it is critical for him to report abdominal pain & diarrhea

## 2018-12-04 NOTE — Patient Instructions (Signed)
See assessment and plan under each diagnosis in the problem list and acutely for this visit 

## 2018-12-04 NOTE — Progress Notes (Signed)
NURSING HOME LOCATION:  [facility] ROOM NUMBER:  309-A  CODE STATUS: Full code  PCP: [name], MD [address] [place] [address]   This is a comprehensive admission note to [facility] performed on this date less than 30 days from date of admission. Included are preadmission medical/surgical history; reconciled medication list; family history; social history and comprehensive review of systems.  Corrections and additions to the records were documented. Comprehensive physical exam was also performed. Additionally a clinical summary was entered for each active diagnosis pertinent to this admission in the Problem List to enhance continuity of care.  HPI: The patient was hospitalized at [facility] 6/28- 12/04/2018.   Here at [facility] for rehab post fracture he developed abdominal pain, anorexia, and diarrhea and was transferred to [facility] ED 6/22.   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   At presentation to [name] he was tachycardic hypotensive and had acute mental status changes.  Work-up revealed AKI, leukopenia, multiple electrolyte abnormalities, C. difficile pancolitis and strep bacteremia.  He received bicarb infusions and normal saline with electrolyte supplementation as directed by lab abnormalities.  He received p.o./rectal vancomycin and IV Flagyl for C. difficile and penicillin for strep anginosis bacteremia.  Volvulus of the sigmoid colon decompression was accomplished by sigmoidoscopy on 6/28.  Immunosuppression was adjusted.  Specifically CellCept was discontinued and Prograf decreased to 0.5 mg twice daily. This was subsequently held due to a level approximately 40.   Transplant team accepted him in transfer.  AKI was attributed to ischemia/ATN insults due to sepsis.  On 6/29 he underwent exploratory laparotomy with  sigmoidectomy and descending end colostomy with rectal stump decompression by ACS.  Pseudomembranous colitis and recurrent volvulus was noted intraoperatively. Hospital course was complicated by delirium/agitation and significant insomnia.  Vancomycin/Flagyl reinitiated 7/24 for positive C. difficile with severe ileus.   TF and TPN were required for severe protein caloric malnutrition. At discharge his renal function had improved and was felt to be at or near baseline.  Immunosuppression at discharge was to include tacrolimus 1 mg each morning and 1 mg each evening and prednisone 10 mg daily.  Follow-up appointment was scheduled for 9/18 with Dr. Theda Sers. Labs on the day of discharge revealed a white count of 4.4, hemoglobin 11.1/hematocrit 34.2, platelet count 100,000, BUN 40, and creatinine 1.3.  Past medical and surgical history: Other significant past history includes essential hypertension, SLE, HIV, DVT, achalasia and ESRD.  He has a history of multiple falls associated with fractures including bilateral sacral fractures, S2 compression fracture, right greater trochanter, L5 fracture and first MCP dislocation.  Social history: Non-smoker, nondrinker.  Family history: Reviewed   Review of systems: He denies any active symptoms.  Most significantly he denies any pain or active GI symptoms.  He has no symptoms to suggest COVID-19 infection.  He states that his appetite is "so-so".  The veracity of his denials is in question as he was withdrawn and was shaking his head no before I could even finish the question.  Constitutional: No fever, chills  Eyes: No redness, discharge, pain, vision change ENT/mouth: No nasal congestion, purulent discharge, earache, change in hearing, sore throat  Cardiovascular: No chest pain, palpitations, paroxysmal nocturnal dyspnea, claudication, edema  Respiratory: No cough, sputum production, hemoptysis, DOE, significant snoring, apnea Gastrointestinal: No heartburn,  dysphagia, abdominal pain, nausea /vomiting, rectal  bleeding, melena, change in bowels Genitourinary: No dysuria, hematuria, pyuria, incontinence, nocturia Musculoskeletal: No joint stiffness, joint swelling, weakness, pain Dermatologic: No rash, pruritus, change in appearance of skin Neurologic: No dizziness, headache, syncope, seizures, numbness, tingling Psychiatric: No significant anxiety, depression, insomnia Endocrine: No change in hair/skin/nails, excessive thirst, excessive hunger, excessive urination  Hematologic/lymphatic: No significant bruising, lymphadenopathy, abnormal bleeding Allergy/immunology: No itchy/watery eyes, significant sneezing, urticaria, angioedema  Physical exam:  Pertinent or positive findings: He is profoundly cachectic  & gaunt with diffuse muscle wasting.  As noted he is withdrawn, responding in monosyllables.  His voice is barely above a hoarse whisper.  Slight OD exotropia suggested.  Heart sounds and breath sounds are both distant.  Ostomy is present.  Pedal pulses are decreased, the dorsalis pedis pulses are stronger than the posterior tibial pulses.  As noted he has limb atrophy.  There is weakness to opposition more so in the lower extremities than in the upper extremities.  General appearance: no acute distress, increased work of breathing is present.   Lymphatic: No lymphadenopathy about the head, neck, axilla. Eyes: No conjunctival inflammation or lid edema is present. There is no scleral icterus. Ears:  External ear exam shows no significant lesions or deformities.   Nose:  External nasal examination shows no deformity or inflammation. Nasal mucosa are pink and moist without lesions, exudates Oral exam: Lips and gums are healthy appearing.There is no oropharyngeal erythema or exudate. Neck:  No thyromegaly, masses, tenderness noted. Heart:  No gallop, murmur, click, rub.  Lungs:  without wheezes, rhonchi, rales, rubs. Abdomen: Bowel sounds are normal.   Abdomen is soft and nontender with no organomegaly, hernias, masses. GU: Deferred  Extremities:  No cyanosis, clubbing, edema. Neurologic exam: Balance, Rhomberg, finger to nose testing could not be completed due to clinical state Skin: Warm & dry w/o tenting. No significant lesions or rash.  See clinical summary under each active problem in the Problem List with associated updated therapeutic plan

## 2018-12-05 ENCOUNTER — Encounter: Payer: Self-pay | Admitting: Internal Medicine

## 2018-12-05 ENCOUNTER — Other Ambulatory Visit: Payer: Self-pay | Admitting: Adult Health

## 2018-12-05 MED ORDER — METHYLPHENIDATE HCL 2.5 MG PO CHEW: 1.0000 | CHEWABLE_TABLET | Freq: Two times a day (BID) | ORAL | 0 refills | Status: DC

## 2018-12-06 LAB — BASIC METABOLIC PANEL
BUN: 47 — AB (ref 4–21)
Creatinine: 1.6 — AB (ref 0.6–1.3)
Glucose: 80
Potassium: 5.2 (ref 3.4–5.3)
Sodium: 138 (ref 137–147)

## 2018-12-06 LAB — CBC AND DIFFERENTIAL
HCT: 36 — AB (ref 41–53)
Hemoglobin: 12.2 — AB (ref 13.5–17.5)
Neutrophils Absolute: 4
Platelets: 97 — AB (ref 150–399)
WBC: 5.1

## 2018-12-10 ENCOUNTER — Telehealth: Payer: Self-pay

## 2018-12-10 ENCOUNTER — Other Ambulatory Visit: Payer: Self-pay

## 2018-12-10 MED ORDER — ABACAVIR-DOLUTEGRAVIR-LAMIVUD 600-50-300 MG PO TABS: 1.0000 | ORAL_TABLET | Freq: Every day | ORAL | 2 refills | Status: DC

## 2018-12-10 NOTE — Telephone Encounter (Signed)
Received call from [facility] for medication refill. E-fill sent to pharmacy requested. Also schedule overdue follow up appointment with Doren Custard, NP.  Eugenia Mcalpine

## 2018-12-10 NOTE — Telephone Encounter (Signed)
I called the rehab facility, [facility] and spoke with [name] nurse, Kenneth [name].  She stated that the medication cost issue has been resolved and [name] medications will be shipped to them tomorrow.  Kenneth [name]. Kenneth [name] Patient [facility] Phone: [phone] Fax:  [phone]

## 2018-12-10 NOTE — Telephone Encounter (Signed)
Received call from [name], RN at [facility] stating patient needs assistance paying for medication. LPN spoke with [name] in pharmacy and she will reach out to [facility] billing department to obtain patient incoming information.  Kenneth [name]

## 2018-12-16 LAB — BASIC METABOLIC PANEL
BUN: 29 — AB (ref 4–21)
Creatinine: 1.1 (ref 0.6–1.3)
Glucose: 103
Potassium: 4.6 (ref 3.4–5.3)
Sodium: 139 (ref 137–147)

## 2018-12-16 LAB — CBC AND DIFFERENTIAL
HCT: 35 — AB (ref 41–53)
Hemoglobin: 11.9 — AB (ref 13.5–17.5)
Neutrophils Absolute: 1
Platelets: 155 (ref 150–399)
WBC: 2.1

## 2018-12-17 ENCOUNTER — Other Ambulatory Visit: Payer: Self-pay | Admitting: Internal Medicine

## 2018-12-20 ENCOUNTER — Other Ambulatory Visit: Payer: Self-pay | Admitting: Internal Medicine

## 2018-12-20 MED ORDER — DRONABINOL 5 MG PO CAPS: 5.0000 mg | ORAL_CAPSULE | Freq: Two times a day (BID) | ORAL | 0 refills | Status: DC

## 2018-12-20 MED ORDER — METHYLPHENIDATE HCL 2.5 MG PO CHEW: 1.0000 | CHEWABLE_TABLET | Freq: Two times a day (BID) | ORAL | 0 refills | Status: DC

## 2018-12-23 ENCOUNTER — Ambulatory Visit: Payer: Medicare Other | Admitting: Infectious Diseases

## 2018-12-24 LAB — CBC AND DIFFERENTIAL
HCT: 37 — AB (ref 41–53)
Hemoglobin: 12.5 — AB (ref 13.5–17.5)
Neutrophils Absolute: 3
Platelets: 172 (ref 150–399)
WBC: 3.8

## 2018-12-24 LAB — BASIC METABOLIC PANEL
BUN: 18 (ref 4–21)
Creatinine: 1.1 (ref 0.6–1.3)
Glucose: 136
Potassium: 4.5 (ref 3.4–5.3)
Sodium: 138 (ref 137–147)

## 2018-12-27 ENCOUNTER — Encounter: Payer: Self-pay | Admitting: Internal Medicine

## 2018-12-27 DIAGNOSIS — D7589 Other specified diseases of blood and blood-forming organs: Secondary | ICD-10-CM | POA: Insufficient documentation

## 2018-12-30 ENCOUNTER — Non-Acute Institutional Stay (SKILLED_NURSING_FACILITY): Payer: Medicare Other | Admitting: Adult Health

## 2018-12-30 ENCOUNTER — Encounter: Payer: Self-pay | Admitting: Adult Health

## 2018-12-30 DIAGNOSIS — K562 Volvulus: Secondary | ICD-10-CM | POA: Diagnosis not present

## 2018-12-30 DIAGNOSIS — E43 Unspecified severe protein-calorie malnutrition: Secondary | ICD-10-CM | POA: Diagnosis not present

## 2018-12-30 DIAGNOSIS — D539 Nutritional anemia, unspecified: Secondary | ICD-10-CM | POA: Diagnosis not present

## 2018-12-30 NOTE — Progress Notes (Signed)
Location:  [place] Room Number: 111-A Place of Service:  SNF (31) Provider:  Durenda Age, DNP, FNP-BC  Patient Care Team: Kenneth Pike, MD as PCP - General Kenneth Sima [name], MD as PCP - Infectious Diseases (Infectious Diseases) Kenneth Parish, MD as Consulting Physician (Nephrology) Kenneth Kenneth [name] (Transplant)  Extended Emergency Contact Information Primary Emergency Contact: Kenneth Kenneth [name] Mobile Phone: [phone] Relation: Significant other Secondary Emergency Contact: Kenneth,Kenneth [name] Address: [place], [place] [address] Kenneth Kenneth [name] of Kenneth [name] Mobile Phone: [phone] Relation: Niece  Code Status:  Full Code  Goals of care: Advanced Directive information Advanced Directives 09/24/2018  Does Patient Have a Medical Advance Directive? No  Type of Advance Directive -  Does patient want to make changes to medical advance directive? No - Patient declined  Copy of [place] in Chart? -  Would patient like information on creating a medical advance directive? -     Chief Complaint  Patient presents with  . Acute Visit    Patient is seen for macrocytosis anemia    HPI:  Pt is a 63 y.o. Kenneth [name] seen today. He has PMH of HIV, ESRD S/P renal transplant at [facility] 04/13/18.  CBC was recently done ( 12/24/18): Noted hgb up from 12.1 to 12.5, mcv up from 87.8 to 102.9, platelet up from 72 to 172. He was seen in his room today. He verbalized feeling good and denies having pain. He was recently started on Vitamin B 12 1,000 mcg tablet daily and Folic acid 1 mg daily.  His colostomy bag was noted to be full of gas/air. He requested to be taught how to care for it when he gets discharged. Charge nurse gave him instruction on how to care for his colostomy while decompressing air from colostomy bag.  He has been admitted to [facility] on 12/04/18 from [facility] with Pseudomembranous colitis and recurrent volvulus. On 6/29/2o he underwent ex-lap with sigmoidectomy and descending end colostomy with rectal stump decompression by ACS. His hospital stay was complicated by delirium/agitation, insomnia, altered mental status, recurrent fever/sepsis S/P re-initiation of Vanc/Flagyl for positive C. Difficile PCR/toxin, severe ileus, EBV viremia and severe protein calorie malnutrition requiring intermittent tube feeding and TPN. Of note, he was discharged to a rehab facility after his most recent fall and hospitalization in May 20220, then brought to [facility] on 09/23/18 with abdominal pain, poor oral intake and a 4-day history of diarrhea. He was diagnosed to be septic with fever, hypotension, and AKI. Infectious work up was notable for C. difficile pancolitis and strep anginosis bacteremia. CT AP demonstrated new fractures of right greater trochanter, L5 fracture in known sacral fracture.  He then underwent sigmoidoscopy with volvulus decompression on 6/28 and was subsequently transferred to [facility] for further management.  He is currently having short-term rehabilitation at [facility].    Past Medical History:  Diagnosis Date  . Achalasia   . Candida infection, esophageal ([place]) 08/2010   a. 2012 - noted on EGD.  Marland Kitchen DVT (deep venous thrombosis) ([place]) ~ 04/2014   ?RLE  . Dysphagia 2008   post heller myotomy/toupee fundoplication.    . ESRD (end stage renal disease) on dialysis [facility])    "MWF: Kenneth Kenneth [name]" (01/22/2017)  . Fall 01/22/2017   mechanical fall w/multiple fractures/notes 01/22/2017  . GERD (gastroesophageal reflux disease)    hx (01/22/2017)  .  Hemorrhoids, internal   . History of blood transfusion 03/2014   "low counts"  . History of gout   . History of stomach ulcers   . HIV infection ([place]) dx ~ 2009   a. 02/13/2014 CD4 = 240 - undetectable viral load.  . Hyperlipidemia   . Hypertension    hx  (01/22/2017)  . Insomnia   . Lupus nephritis ([place])   . Pancytopenia ([place])   . Premature ventricular contractions   . Renal abscess   . SLE (systemic lupus erythematosus) ([place]) dx'd 2015   Past Surgical History:  Procedure Laterality Date  . BASCILIC VEIN TRANSPOSITION Left 06/29/2014   Procedure: FIRST STAGE  [place];  Surgeon: Rosetta Posner, MD;  Location: [facility];  Service: Vascular;  Laterality: Left;  . BASCILIC VEIN TRANSPOSITION Left 09/11/2014   Procedure: SECOND STAGE BASILIC VEIN TRANSPOSITION LEFT UPPER ARM;  Surgeon: Rosetta Posner, MD;  Location: [facility];  Service: Vascular;  Laterality: Left;  . BOWEL DECOMPRESSION N/A 09/29/2018   Procedure: BOWEL DECOMPRESSION;  Surgeon: Laurence Spates, MD;  Location: [facility];  Service: Endoscopy;  Laterality: N/A;  . COLONOSCOPY  2008   .  tortuous colon, internal hemorrhoids.  no polyps or diverticulosis.   Marland Kitchen ESOPHAGOGASTRODUODENOSCOPY  08/2010   Dr [name].  08/2010 candida esophagitis, no obvious esophageal stricture. 02/2006 and 05/2006 dilated esophagus, narrowed distal esophagus but no stricture, was empirically [place] dilated  . ESOPHAGOGASTRODUODENOSCOPY N/A 3/Kenneth/2016   Procedure: ESOPHAGOGASTRODUODENOSCOPY (EGD);  Surgeon: [name], MD;  Location: [facility] ENDOSCOPY;  Service: Endoscopy;  Laterality: N/A;  . ESOPHAGOGASTRODUODENOSCOPY N/A 07/02/2014   Procedure: ESOPHAGOGASTRODUODENOSCOPY (EGD);  Surgeon: [name], MD;  Location: [facility] ENDOSCOPY;  Service: Endoscopy;  Laterality: N/A;  with botox  . FLEXIBLE SIGMOIDOSCOPY N/A 09/29/2018   Procedure: FLEXIBLE SIGMOIDOSCOPY;  Surgeon: Laurence Spates, MD;  Location: [facility];  Service: Endoscopy;  Laterality: N/A;  . [place]   with toupee fundoplication of HH.   . INSERTION OF DIALYSIS CATHETER Right 06/29/2014   Procedure: INSERTION OF DIALYSIS CATHETER RIGHT IJ;  Surgeon: Rosetta Posner, MD;  Location: [facility];  Service: Vascular;  Laterality: Right;  . KIDNEY  TRANSPLANT     January 2020 @ [facility]  . NEPHRECTOMY Right ~ 2011  . ORIF TIBIA PLATEAU Right 01/23/2017   Procedure: OPEN REDUCTION INTERNAL FIXATION (ORIF) TIBIAL PLATEAU; REPAIR OF LATERAL TIBIAL PLATEAU; ARTHROTOMY WITH MENISCUS REPAIR; ANTERIOR COMPARTMENT FASCIOTOMY;  Surgeon: Altamese [place], MD;  Location: [facility];  Service: Orthopedics;  Laterality: Right;    No Known Allergies  Outpatient Encounter Medications as of 12/30/2018  Medication Sig  . abacavir-dolutegravir-lamiVUDine (TRIUMEQ) 600-50-300 MG tablet Take 1 tablet by mouth daily.  Marland Kitchen aspirin 81 MG chewable tablet Chew 81 mg by mouth daily.  . Cholecalciferol (VITAMIN D3) 125 MCG (5000 UT) CAPS Take 1 capsule by mouth daily.  Marland Kitchen dronabinol (MARINOL) 5 MG capsule Take 1 capsule (5 mg total) by mouth 2 (two) times daily before a meal.  . folic acid (FOLVITE) 1 MG tablet Take 1 mg by mouth daily.   . magnesium oxide (MAG-OX) 400 MG tablet Take 400 mg by mouth 2 (two) times daily. Lunch and dinner  . Melatonin 3 MG TABS Take 6 tablets by mouth at bedtime. 2 tabs to = 6 mg  . Methylphenidate HCl 2.5 MG CHEW Chew 1 tablet (2.5 mg total) by mouth 2 (two) times daily. Lunch and dinner  . mirtazapine (REMERON) 30 MG tablet Take 30  mg by mouth at bedtime.  . Nutritional Supplement LIQD Take 120 mLs by mouth 3 (three) times daily. MedPass  . pantoprazole (PROTONIX) 40 MG tablet Take 40 mg by mouth 2 (two) times daily.  . pentamidine (PENTAM) 300 MG inhalation solution Take 300 mg by nebulization See admin instructions. Inhale via nebulizer every 28 days  . predniSONE (DELTASONE) 10 MG tablet Take 1 tablet (10 mg total) by mouth daily.  . valGANciclovir (VALCYTE) 450 MG tablet Take 900 mg by mouth daily. 2 tablets to = 900 mg with breakfast  . Vancomycin HCl (FIRVANQ) 50 MG/ML SOLR Take 2.5 mLs by mouth every other day.   . vitamin B-12 (CYANOCOBALAMIN) 1000 MCG tablet Take 1,000 mcg by mouth daily.  . [DISCONTINUED] tacrolimus (PROGRAF) 0.5  MG capsule Take 1 capsule (0.5 mg total) by mouth 2 (two) times daily.   No facility-administered encounter medications on file as of 12/30/2018.     Review of Systems  GENERAL: No fever, chills or weakness MOUTH and THROAT: Denies oral discomfort, gingival pain or bleeding RESPIRATORY: no cough, SOB, DOE, wheezing, hemoptysis CARDIAC: No chest pain, edema or palpitations GI: No abdominal pain, diarrhea, constipation, heart burn, nausea or vomiting GU: Denies dysuria, frequency, hematuria, or discharge NEUROLOGICAL: Denies dizziness, syncope, numbness, or headache PSYCHIATRIC: Denies feelings of depression or anxiety. No report of hallucinations, insomnia, paranoia, or agitation    Immunization History  Administered Date(s) Administered  . DTaP / Hep B / IPV 08/31/2006, 10/02/2006, 02/19/2007  . Hepatitis A, Adult 08/23/2017, 03/21/2018  . Hepatitis B 08/31/2006, 10/02/2006, 02/19/2007  . Hepatitis B, adult 08/23/2017, 08/23/2017, 03/21/2018, 03/21/2018, 04/23/2018, 04/23/2018  . Influenza Whole 01/22/2008  . Influenza, Seasonal, Injecte, Preservative Fre 04/24/2016, 01/05/2017  . Influenza-Unspecified 02/02/2016, 01/15/2017, 01/01/2018  . Pneumococcal Conjugate-13 02/27/2017  . Pneumococcal Polysaccharide-23 08/31/2006, 07/10/2017  . Td 11/01/2004  . Tdap 07/10/2017   Pertinent  Health Maintenance Due  Topic Date Due  . COLONOSCOPY  06/01/2016  . INFLUENZA VACCINE  11/02/2018   Fall Risk  08/22/2018 03/21/2018 08/23/2017 03/01/2017 09/16/2015  Falls in the past year? 1 0 Yes Yes No  Number falls in past yr: 0 - 1 1 -  Injury with Fall? 1 - Yes Yes -     Vitals:   12/30/18 1308  BP: 110/66  Pulse: 80  Resp: 18  Temp: (!) 97.4 F (36.3 C)  TempSrc: Oral  Weight: 109 lb (49.4 kg)  Height: 5\' 11"  (1.803 m)   Body mass index is 15.2 kg/m.  Physical Exam  GENERAL APPEARANCE:  In no acute distress.  SKIN:  Skin is warm and dry.  MOUTH and THROAT: Lips are without  lesions. Oral mucosa is moist and without lesions. Tongue is normal in shape, size, and color and without lesions RESPIRATORY: Breathing is even & unlabored, BS CTAB CARDIAC: RRR, no murmur,no extra heart sounds, no edema GI: Abdomen soft, normal BS, no masses, no tenderness EXTREMITIES:  Able to move X 4 extremities NEUROLOGICAL: There is no tremor. Speech is clear. Alert and oriented X 3. PSYCHIATRIC:  Affect and behavior are appropriate   Labs reviewed: Recent Labs    09/23/18 1854  06/Kenneth/20 0931 06/Kenneth/20 1444 09/28/18 0237 09/29/18 0830 12/24/18  NA 133*   < > 134* 132* 131* 131* 138  K 4.5   < > 3.4* 4.1 4.7 4.9 4.5  CL 101   < > 88* 98 99 99  --   CO2 16*   < > 35* 20* 19*  18*  --   GLUCOSE 141*   < > 426* 147* 158* 105*  --   BUN 74*   < > 82* 92* 96* 89* 18  CREATININE 3.68*   < > 3.13* 3.75* 3.73* 4.13* 1.1  CALCIUM 8.4*   < > 5.6* 6.7* 6.7* 6.5*  --   MG 2.7*  --  2.2  --   --   --   --   PHOS  --   --  1.7*  --  3.8 3.2  --    < > = values in this interval not displayed.   Recent Labs    03/07/18 1518  09/13/18 09/17/18 09/23/18 1854 06/Kenneth/20 0931 09/29/18 0830  AST Kenneth   < > Kenneth 22 25   --   --   ALT 15   < > 28 18 13   --   --   ALKPHOS  --    < > 193* 151* 90  --   --   BILITOT 0.5  --   --   --  0.6  --   --   PROT 8.6*  --   --   --  6.4*  --   --   ALBUMIN  --   --   --   --  2.3* 1.5* 1.5*   < > = values in this interval not displayed.   Recent Labs    09/26/18 0751 06/Kenneth/20 0931 09/28/18 0237 09/29/18 1502 12/24/18  WBC 1.3* 1.3* 2.2* 2.5* 3.8  NEUTROABS 0.9*  --  1.4*  --  3  HGB 12.4* 11.6* 12.4* 12.1* 12.5*  HCT 34.9* 31.7* 35.1* 33.8* 37*  MCV 86.4 85.9 87.3 87.8  --   PLT 156 103* 86* 72* 172   Lab Results  Component Value Date   TSH 0.581 01/24/2017    Lab Results  Component Value Date   CHOL 185 07/18/2017   HDL 37 (L) 07/18/2017   LDLCALC 120 (H) 07/18/2017   LDLDIRECT 65 07/24/2006   TRIG 158 (H) 07/18/2017   CHOLHDL 5.0 (H)  07/18/2017    Assessment/Plan  1. Macrocytic anemia CBC Latest Ref Rng & Units 12/24/2018 12/16/2018 12/06/2018  WBC - 3.8 2.1 5.1  Hemoglobin 13.5 - 17.5 12.5(A) 11.9(A) 12.2(A)  Hematocrit 41 - 53 37(A) 35(A) 36(A)  Platelets 150 - 399 172 155 97(A)  - will check for folate and vitamin B 12 level -Continue vitamin B12 1000 mcg 1 tab daily and folic acid 1 mg 1 tab daily  2. Volvulus of sigmoid colon S/P colostomy(HCC) - charge nurse taught him colostomy care in preparation for discharge  3. Protein-calorie malnutrition, severe ([place]) Wt Readings from Last 3 Encounters:  12/30/18 109 lb (49.4 kg)  12/04/18 124 lb 5.4 oz (56.4 kg)  09/29/18 124 lb 5.4 oz (56.4 kg)  - continue medpass 12o ml TID     Family/ staff Communication:  Discussed plan of care with resident.  Labs/tests ordered:  None  Goals of care:   Short-term care   Durenda Age, DNP, FNP-BC [facility] and Adult Medicine [phone] (Monday-Friday 8:00 a.m. - 5:00 p.m.) [phone] (after hours)

## 2018-12-31 DIAGNOSIS — K562 Volvulus: Secondary | ICD-10-CM | POA: Insufficient documentation

## 2019-01-01 ENCOUNTER — Non-Acute Institutional Stay (SKILLED_NURSING_FACILITY): Payer: Medicare Other | Admitting: Adult Health

## 2019-01-01 ENCOUNTER — Encounter: Payer: Self-pay | Admitting: Adult Health

## 2019-01-01 DIAGNOSIS — E43 Unspecified severe protein-calorie malnutrition: Secondary | ICD-10-CM

## 2019-01-01 DIAGNOSIS — Z8719 Personal history of other diseases of the digestive system: Secondary | ICD-10-CM | POA: Diagnosis not present

## 2019-01-01 DIAGNOSIS — G4700 Insomnia, unspecified: Secondary | ICD-10-CM

## 2019-01-01 DIAGNOSIS — D539 Nutritional anemia, unspecified: Secondary | ICD-10-CM

## 2019-01-01 DIAGNOSIS — A0472 Enterocolitis due to Clostridium difficile, not specified as recurrent: Secondary | ICD-10-CM

## 2019-01-01 DIAGNOSIS — Z940 Kidney transplant status: Secondary | ICD-10-CM | POA: Diagnosis not present

## 2019-01-01 DIAGNOSIS — B20 Human immunodeficiency virus [HIV] disease: Secondary | ICD-10-CM | POA: Diagnosis not present

## 2019-01-01 DIAGNOSIS — K562 Volvulus: Secondary | ICD-10-CM

## 2019-01-01 MED ORDER — VITAMIN B-12 1000 MCG PO TABS: 1000.0000 ug | ORAL_TABLET | Freq: Every day | ORAL | 0 refills | Status: AC

## 2019-01-01 MED ORDER — PANTOPRAZOLE SODIUM 40 MG PO TBEC: 40.0000 mg | DELAYED_RELEASE_TABLET | Freq: Two times a day (BID) | ORAL | 0 refills | Status: DC

## 2019-01-01 MED ORDER — ABACAVIR-DOLUTEGRAVIR-LAMIVUD 600-50-300 MG PO TABS: 1.0000 | ORAL_TABLET | Freq: Every day | ORAL | 0 refills | Status: DC

## 2019-01-01 MED ORDER — PREDNISONE 10 MG PO TABS: 10.0000 mg | ORAL_TABLET | Freq: Every day | ORAL | 0 refills | Status: DC

## 2019-01-01 MED ORDER — VALGANCICLOVIR HCL 450 MG PO TABS: 900.0000 mg | ORAL_TABLET | Freq: Every day | ORAL | 0 refills | Status: DC

## 2019-01-01 MED ORDER — PENTAMIDINE ISETHIONATE 300 MG IN SOLR: 300.0000 mg | RESPIRATORY_TRACT | 0 refills | Status: DC

## 2019-01-01 MED ORDER — FOLIC ACID 1 MG PO TABS: 1.0000 mg | ORAL_TABLET | Freq: Every day | ORAL | 0 refills | Status: AC

## 2019-01-01 MED ORDER — MELATONIN 3 MG PO TABS: 2.0000 | ORAL_TABLET | Freq: Every day | ORAL | 0 refills | Status: AC

## 2019-01-01 MED ORDER — DRONABINOL 5 MG PO CAPS: 5.0000 mg | ORAL_CAPSULE | Freq: Two times a day (BID) | ORAL | 0 refills | Status: AC

## 2019-01-01 MED ORDER — MIRTAZAPINE 30 MG PO TABS: 30.0000 mg | ORAL_TABLET | Freq: Every day | ORAL | 0 refills | Status: DC

## 2019-01-01 MED ORDER — TACROLIMUS 1 MG PO CAPS: 1.0000 mg | ORAL_CAPSULE | Freq: Two times a day (BID) | ORAL | 0 refills | Status: DC

## 2019-01-01 MED ORDER — MAGNESIUM OXIDE 400 MG PO TABS: 400.0000 mg | ORAL_TABLET | Freq: Two times a day (BID) | ORAL | 0 refills | Status: AC

## 2019-01-01 NOTE — Progress Notes (Signed)
Location:  [facility] Room Number: 111/A Place of Service:  SNF (31) Provider:  Durenda Age, DNP, FNP-BC  Patient Care Team: [name], MD as PCP - General [name] [name], MD as PCP - Infectious Diseases (Infectious Diseases) Corliss Parish, MD as Consulting Physician (Nephrology) Malachy Mood (Transplant)  Extended Emergency Contact Information Primary Emergency Contact: [place] of [place] Mobile Phone: [phone] Relation: Significant other Secondary Emergency Contact: Paff,Stephanie Address: [place], [place] [address] Johnnette Litter of [place] Mobile Phone: [phone] Relation: Niece  Code Status:  Full Code  Goals of care: Advanced Directive information Advanced Directives 01/01/2019  Does Patient Have a Medical Advance Directive? Yes  Type of Advance Directive (No Data)  Does patient want to make changes to medical advance directive? -  Copy of [place] in Chart? -  Would patient like information on creating a medical advance directive? -     Chief Complaint  Patient presents with  . Discharge Note    Discharge Visit    HPI:  Pt is a 63 y.o. male who is for discharge home on 01/04/2019 with home health PT, OT and nurse for colostomy care.  He has been admitted to [facility] on 12/04/2018 from [facility] with pseudomembranous colitis and recurrent volvulus.  On 09/30/2018, he underwent ex lap with sigmoidectomy and descending end colostomy with rectal stump decompression by ACS.  His hospital stay was complicated by delirium/agitation, insomnia, altered mental status, recurrent fever/sepsis S/P reinitiation of Vanco/Flagyl for positive C. difficile PCR/toxin, severe Ileus, EBV viremia and severe protein calorie malnutrition requiring intermittent with feeding and TPN. Of note, he was discharged to a rehab facility after his most  recent fall and hospitalization in May 2020, then brought to [facility] on 09/23/2018 with abdominal pain, poor oral intake and a 4-day history of diarrhea. He was diagnosed to be septic with fever, hypotension and AKI.  Infectious work-up was notable for C. difficile pancolitis and strep anginosis bacteremia.  CT AP demonstrated new fractures of right greater trochanter, L5 fracture and known sacral fracture.  He then underwent sigmoidoscopy with volvulus decompression on 6/28 and was subsequently transferred to [facility] for further management.  Patient was admitted to this facility for short-term rehabilitation after the patient's recent hospitalization.  Patient has completed SNF rehabilitation and therapy has cleared the patient for discharge.   Past Medical History:  Diagnosis Date  . Achalasia   . Candida infection, esophageal ([place]) 08/2010   a. 2012 - noted on EGD.  Marland Kitchen DVT (deep venous thrombosis) ([place]) ~ 04/2014   ?RLE  . Dysphagia 2008   post heller myotomy/toupee fundoplication.    . ESRD (end stage renal disease) on dialysis [facility])    "MWF: [name]" (01/22/2017)  . Fall 01/22/2017   mechanical fall w/multiple fractures/notes 01/22/2017  . GERD (gastroesophageal reflux disease)    hx (01/22/2017)  . Hemorrhoids, internal   . History of blood transfusion 03/2014   "low counts"  . History of gout   . History of stomach ulcers   . HIV infection ([place]) dx ~ 2009   a. 02/13/2014 CD4 = 240 - undetectable viral load.  . Hyperlipidemia   . Hypertension    hx (01/22/2017)  . Insomnia   . Lupus nephritis ([place])   . Pancytopenia ([place])   . Premature ventricular contractions   . Renal abscess   .  SLE (systemic lupus erythematosus) ([place]) dx'd 2015   Past Surgical History:  Procedure Laterality Date  . BASCILIC VEIN TRANSPOSITION Left 06/29/2014   Procedure: FIRST STAGE  [place];  Surgeon: Rosetta Posner, MD;  Location: [place];  Service: Vascular;  Laterality:  Left;  . BASCILIC VEIN TRANSPOSITION Left 09/11/2014   Procedure: SECOND STAGE BASILIC VEIN TRANSPOSITION LEFT UPPER ARM;  Surgeon: Rosetta Posner, MD;  Location: [place];  Service: Vascular;  Laterality: Left;  . BOWEL DECOMPRESSION N/A 09/29/2018   Procedure: BOWEL DECOMPRESSION;  Surgeon: Laurence Spates, MD;  Location: [place];  Service: Endoscopy;  Laterality: N/A;  . COLONOSCOPY  2008   .  tortuous colon, internal hemorrhoids.  no polyps or diverticulosis.   Marland Kitchen ESOPHAGOGASTRODUODENOSCOPY  08/2010   Dr [name].  08/2010 candida esophagitis, no obvious esophageal stricture. 02/2006 and 05/2006 dilated esophagus, narrowed distal esophagus but no stricture, was empirically Savary dilated  . ESOPHAGOGASTRODUODENOSCOPY N/A 06/27/2014   Procedure: ESOPHAGOGASTRODUODENOSCOPY (EGD);  Surgeon: [name], MD;  Location: [facility] ENDOSCOPY;  Service: Endoscopy;  Laterality: N/A;  . ESOPHAGOGASTRODUODENOSCOPY N/A 07/02/2014   Procedure: ESOPHAGOGASTRODUODENOSCOPY (EGD);  Surgeon: [name], MD;  Location: [facility] ENDOSCOPY;  Service: Endoscopy;  Laterality: N/A;  with botox  . FLEXIBLE SIGMOIDOSCOPY N/A 09/29/2018   Procedure: FLEXIBLE SIGMOIDOSCOPY;  Surgeon: Laurence Spates, MD;  Location: [place];  Service: Endoscopy;  Laterality: N/A;  . [place]   with toupee fundoplication of HH.   . INSERTION OF DIALYSIS CATHETER Right 06/29/2014   Procedure: INSERTION OF DIALYSIS CATHETER RIGHT IJ;  Surgeon: Rosetta Posner, MD;  Location: [place];  Service: Vascular;  Laterality: Right;  . KIDNEY TRANSPLANT     January 2020 @ [facility]  . NEPHRECTOMY Right ~ 2011  . ORIF TIBIA PLATEAU Right 01/23/2017   Procedure: OPEN REDUCTION INTERNAL FIXATION (ORIF) TIBIAL PLATEAU; REPAIR OF LATERAL TIBIAL PLATEAU; ARTHROTOMY WITH MENISCUS REPAIR; ANTERIOR COMPARTMENT FASCIOTOMY;  Surgeon: Altamese [place], MD;  Location: [place];  Service: Orthopedics;  Laterality: Right;    No Known Allergies  Outpatient Encounter Medications as  of 01/01/2019  Medication Sig  . abacavir-dolutegravir-lamiVUDine (TRIUMEQ) 600-50-300 MG tablet Take 1 tablet by mouth daily.  Marland Kitchen aspirin 81 MG chewable tablet Chew 81 mg by mouth daily.  . Cholecalciferol (VITAMIN D3) 125 MCG (5000 UT) CAPS Take 1 capsule by mouth daily.  Marland Kitchen dronabinol (MARINOL) 5 MG capsule Take 1 capsule (5 mg total) by mouth 2 (two) times daily before a meal.  . folic acid (FOLVITE) 1 MG tablet Take 1 mg by mouth daily.   . magnesium oxide (MAG-OX) 400 MG tablet Take 400 mg by mouth 2 (two) times daily. Lunch and dinner  . Melatonin 3 MG TABS Take 6 tablets by mouth at bedtime. 2 tabs to = 6 mg  . Methylphenidate HCl 2.5 MG CHEW Chew 1 tablet (2.5 mg total) by mouth 2 (two) times daily. Lunch and dinner  . mirtazapine (REMERON) 30 MG tablet Take 30 mg by mouth at bedtime.  . Nutritional Supplement LIQD Take 120 mLs by mouth 3 (three) times daily. MedPass  . pantoprazole (PROTONIX) 40 MG tablet Take 40 mg by mouth 2 (two) times daily.  . pentamidine (PENTAM) 300 MG inhalation solution Take 300 mg by nebulization See admin instructions. Inhale via nebulizer every 28 days  . predniSONE (DELTASONE) 10 MG tablet Take 1 tablet (10 mg total) by mouth daily.  . valGANciclovir (VALCYTE) 450 MG tablet Take 900 mg by mouth  daily. 2 tablets to = 900 mg with breakfast  . vitamin B-12 (CYANOCOBALAMIN) 1000 MCG tablet Take 1,000 mcg by mouth daily.   No facility-administered encounter medications on file as of 01/01/2019.     Review of Systems  GENERAL: No fever, chills or weakness MOUTH and THROAT: Denies oral discomfort, gingival pain or bleeding, pain from teeth or hoarseness   RESPIRATORY: no cough, SOB, DOE, wheezing, hemoptysis CARDIAC: No chest pain, edema or palpitations GI: No abdominal pain, heart burn, nausea or vomiting GU: Denies dysuria, frequency, hematuria, incontinence, or discharge NEUROLOGICAL: Denies dizziness, syncope, numbness, or headache PSYCHIATRIC: Denies  feelings of depression or anxiety. No report of hallucinations, insomnia, paranoia, or agitation    Immunization History  Administered Date(s) Administered  . DTaP / Hep B / IPV 08/31/2006, 10/02/2006, 02/19/2007  . Hepatitis A, Adult 08/23/2017, 03/21/2018  . Hepatitis B 08/31/2006, 10/02/2006, 02/19/2007  . Hepatitis B, adult 08/23/2017, 08/23/2017, 03/21/2018, 03/21/2018, 04/23/2018, 04/23/2018  . Influenza Whole 01/22/2008  . Influenza, Seasonal, Injecte, Preservative Fre 04/24/2016, 01/05/2017  . Influenza-Unspecified 02/02/2016, 01/15/2017, 01/01/2018  . Pneumococcal Conjugate-13 02/27/2017  . Pneumococcal Polysaccharide-23 08/31/2006, 07/10/2017  . Td 11/01/2004  . Tdap 07/10/2017   Pertinent  Health Maintenance Due  Topic Date Due  . COLONOSCOPY  06/01/2016  . INFLUENZA VACCINE  11/02/2018   Fall Risk  08/22/2018 03/21/2018 08/23/2017 03/01/2017 09/16/2015  Falls in the past year? 1 0 Yes Yes No  Number falls in past yr: 0 - 1 1 -  Injury with Fall? 1 - Yes Yes -     Vitals:   01/01/19 1246  BP: 114/66  Pulse: 87  Resp: 16  Temp: 98 F (36.7 C)  TempSrc: Oral  SpO2: 93%  Weight: 112 lb 3.2 oz (50.9 kg)  Height: 5\' 11"  (1.803 m)   Body mass index is 15.65 kg/m.  Physical Exam  GENERAL APPEARANCE:  In no acute distress.  SKIN:  Skin is warm and dry.  MOUTH and THROAT: Lips are without lesions. Oral mucosa is moist and without lesions. Tongue is normal in shape, size, and color and without lesions RESPIRATORY: Breathing is even & unlabored, BS CTAB CARDIAC: RRR, no murmur,no extra heart sounds, no edema GI: +colostomy EXTREMITIES:  Able to move X 4 extremities NEUROLOGICAL: There is no tremor. Speech is clear. Alert and oriented X 3. PSYCHIATRIC:  Affect and behavior are appropriate   Labs reviewed: Recent Labs    09/23/18 1854  09/27/18 0931 09/27/18 1444 09/28/18 0237 09/29/18 0830 12/06/18 12/16/18 12/24/18  NA 133*   < > 134* 132* 131* 131* 138  139 138  K 4.5   < > 3.4* 4.1 4.7 4.9 5.2 4.6 4.5  CL 101   < > 88* 98 99 99  --   --   --   CO2 16*   < > 35* 20* 19* 18*  --   --   --   GLUCOSE 141*   < > 426* 147* 158* 105*  --   --   --   BUN 74*   < > 82* 92* 96* 89* 47* 29* 18  CREATININE 3.68*   < > 3.13* 3.75* 3.73* 4.13* 1.6* 1.1 1.1  CALCIUM 8.4*   < > 5.6* 6.7* 6.7* 6.5*  --   --   --   MG 2.7*  --  2.2  --   --   --   --   --   --   PHOS  --   --  1.7*  --  3.8 3.2  --   --   --    < > = values in this interval not displayed.   Recent Labs    03/07/18 1518  09/13/18 09/17/18 09/23/18 1854 09/27/18 0931 09/29/18 0830  AST 26   < > 26 22 25   --   --   ALT 15   < > 28 18 13   --   --   ALKPHOS  --    < > 193* 151* 90  --   --   BILITOT 0.5  --   --   --  0.6  --   --   PROT 8.6*  --   --   --  6.4*  --   --   ALBUMIN  --   --   --   --  2.3* 1.5* 1.5*   < > = values in this interval not displayed.   Recent Labs    09/27/18 0931 09/28/18 0237 09/29/18 1502 12/06/18 12/16/18 12/24/18  WBC 1.3* 2.2* 2.5* 5.1 2.1 3.8  NEUTROABS  --  1.4*  --  4 1 3   HGB 11.6* 12.4* 12.1* 12.2* 11.9* 12.5*  HCT 31.7* 35.1* 33.8* 36* 35* 37*  MCV 85.9 87.3 87.8  --   --   --   PLT 103* 86* 72* 97* 155 172   Lab Results  Component Value Date   TSH 0.581 01/24/2017   No results found for: HGBA1C Lab Results  Component Value Date   CHOL 185 07/18/2017   HDL 37 (L) 07/18/2017   LDLCALC 120 (H) 07/18/2017   LDLDIRECT 65 07/24/2006   TRIG 158 (H) 07/18/2017   CHOLHDL 5.0 (H) 07/18/2017    Significant Diagnostic Results in last 30 days:  No results found.  Assessment/Plan  1. Volvulus of sigmoid colon S/P colostomy(HCC) - for Home health PT and OT for therapeutic strengthening exercises - Home health nurse for colostomy care - follow-up with General Surgery, Dr. Hilaria Ota on 01/10/19   2. HIV disease [facility]) - follow-up with Infectious Disease,  Dr. Barbette Hair, on 01/10/19 -  abacavir-dolutegravir-lamiVUDine (TRIUMEQ) [id number] MG tablet; Take 1 tablet by mouth daily.  Dispense: 30 tablet; Refill: 0 - pentamidine (PENTAM) 300 MG inhalation solution; Take 300 mg by nebulization See admin instructions. Inhale via nebulizer every 28 days  Dispense: 1 each; Refill: 0  3. History of renal transplant - tacrolimus (PROGRAF) 1 MG capsule; Take 1 capsule (1 mg total) by mouth every 12 (twelve) hours.  Dispense: 60 capsule; Refill: 0 - predniSONE (DELTASONE) 10 MG tablet; Take 1 tablet (10 mg total) by mouth daily.  Dispense: 30 tablet; Refill: 0 - valGANciclovir (VALCYTE) 450 MG tablet; Take 2 tablets (900 mg total) by mouth daily. 2 tablets to = 900 mg with breakfast  Dispense: 60 tablet; Refill: 0  4. History of esophageal ulcer - pantoprazole (PROTONIX) 40 MG tablet; Take 1 tablet (40 mg total) by mouth 2 (two) times daily.  Dispense: 60 tablet; Refill: 0 - magnesium oxide (MAG-OX) 400 MG tablet; Take 1 tablet (400 mg total) by mouth 2 (two) times daily. Lunch and dinner  Dispense: 60 tablet; Refill: 0  5. Clostridium enterocolitis - completed Firvanc treatment on 12/31/18, resolved  6. Macrocytic anemia - vitamin B-12 (CYANOCOBALAMIN) 1000 MCG tablet; Take 1 tablet (1,000 mcg total) by mouth daily.  Dispense: 30 tablet; Refill: 0 - folic acid (FOLVITE) 1 MG tablet; Take 1 tablet (1 mg total) by  mouth daily.  Dispense: 30 tablet; Refill: 0  7. Insomnia, unspecified type - Melatonin 3 MG TABS; Take 2 tablets (6 mg total) by mouth at bedtime. 2 tabs to = 6 mg  Dispense: 60 tablet; Refill: 0  8. Protein-calorie malnutrition, severe (HCC) - mirtazapine (REMERON) 30 MG tablet; Take 1 tablet (30 mg total) by mouth at bedtime.  Dispense: 30 tablet; Refill: 0 - dronabinol (MARINOL) 5 MG capsule; Take 1 capsule (5 mg total) by mouth 2 (two) times daily before a meal.  Dispense: 60 capsule; Refill: 0    I have filled out patient's discharge paperwork and written  prescriptions.  Patient will receive home health PT, OT and Nurse for colostomy care.   DME provided:  Colostomy supplies  Total discharge time: Greater than 30 minutes Greater than 50% was spent in counseling and coordination of care.   Discharge time involved coordination of the discharge process with social worker, nursing staff and therapy department. Medical justification for home health services/DME verified.   Durenda Age, DNP, FNP-BC [facility] and Adult Medicine [phone] (Monday-Friday 8:00 a.m. - 5:00 p.m.) [phone] (after hours)

## 2019-01-02 ENCOUNTER — Other Ambulatory Visit: Payer: Self-pay | Admitting: Adult Health

## 2019-01-02 DIAGNOSIS — B20 Human immunodeficiency virus [HIV] disease: Secondary | ICD-10-CM

## 2019-01-02 DIAGNOSIS — Z940 Kidney transplant status: Secondary | ICD-10-CM

## 2019-01-02 MED ORDER — ABACAVIR-DOLUTEGRAVIR-LAMIVUD 600-50-300 MG PO TABS: 1.0000 | ORAL_TABLET | Freq: Every day | ORAL | 0 refills | Status: AC

## 2019-01-02 MED ORDER — VALGANCICLOVIR HCL 450 MG PO TABS: 900.0000 mg | ORAL_TABLET | Freq: Every day | ORAL | 0 refills | Status: AC

## 2019-01-10 ENCOUNTER — Ambulatory Visit: Payer: Medicare Other | Admitting: Family Medicine

## 2019-01-13 ENCOUNTER — Telehealth: Payer: Self-pay | Admitting: Family Medicine

## 2019-01-13 NOTE — Telephone Encounter (Signed)
Katie with [facility] calling for verbal orders for nursing.   1 week for 5 and then once every other week for 4 weeks.   Patient has new colostomy, so they are doing a lot of teaching and assessing around that.  Please call Katie back at [phone] for these orders.

## 2019-01-14 NOTE — Telephone Encounter (Signed)
Spoke with Joellen Jersey and approved. Ottis Stain, CMA

## 2019-01-14 NOTE — Telephone Encounter (Signed)
approved

## 2019-01-17 ENCOUNTER — Other Ambulatory Visit: Payer: Self-pay | Admitting: Adult Health

## 2019-01-17 ENCOUNTER — Other Ambulatory Visit: Payer: Self-pay

## 2019-01-17 ENCOUNTER — Ambulatory Visit (INDEPENDENT_AMBULATORY_CARE_PROVIDER_SITE_OTHER): Payer: Medicare Other | Admitting: Family Medicine

## 2019-01-17 DIAGNOSIS — Z940 Kidney transplant status: Secondary | ICD-10-CM

## 2019-01-17 DIAGNOSIS — Z939 Artificial opening status, unspecified: Secondary | ICD-10-CM

## 2019-01-17 DIAGNOSIS — B20 Human immunodeficiency virus [HIV] disease: Secondary | ICD-10-CM

## 2019-01-17 DIAGNOSIS — S72114D Nondisplaced fracture of greater trochanter of right femur, subsequent encounter for closed fracture with routine healing: Secondary | ICD-10-CM | POA: Diagnosis not present

## 2019-01-17 NOTE — Progress Notes (Signed)
   [facility] Phone: [phone]     Kenneth [name] - 63 y.o. 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  He is now discharged from SNF and living on his own again.   Renal transplant: states this has been going well.  Sees his nephrologist regularly.  Is making urine and not having to get Hemodialysis. No dysuria.   Hemicolectomy: pt states he had surgery follow up last Friday.  They are going to attempt a takedown in a few months. He has a nurse come out once a week to check on his ostomy, but otherwise he maintains it on his own. He has not followed up with the GI doctors regarding his toxic megacolon.    Fracture:  Patient states he is able to walk around without a walker or crutches.  States his leg feels much better now.  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   Meds:  Prograf 1.5mg  bid Prednisone 10mg  qd Folic acid 1mg  qd b12 1059mcg qd Asa - 81  protonix 40mg  qd triumeq qd 600/50/300 Vit d3 5k U remeron 30mg  qd Melatonin 6mg   Had flu shot on sept 28 at rehab center.    ROS: See HPI for pertinent positives and negatives  Past Medical History significant for lupus, HIV, ESRD s/p transplant, toxic megacolon.    Family history reviewed for today's visit. No changes.  Social history- patient is a non smoker  Objective:   BP 128/62   Pulse 100   Ht 5\' 11"  (1.803 m)   Wt 122 lb (55.3 kg)   SpO2 98%   BMI 17.02 kg/m  Gen: NAD, alert and oriented, cooperative with exam CV: normal rate, regular rhythm. No murmurs, no rubs.  Resp: LCTAB, no wheezes, crackles. normal work of breathing GI: ostomy pouch located on RUQ.  Ostomy site appears non-infected, not  protruded.  Msk: pt ambulating without difficulty or altered gait.  Psych: Appropriate behavior  Assessment/Plan:   Greater trochanter fracture ([place]) Appears to have resolved with no noticeable long term change in gait/movement.  Pt denies residual pain in the area. No changes today.   History of renal transplant No complaints. Making urine.  Labs have been normal as recently as 4 weeks ago.  Sees his transplant physician regularly at [facility].  No changes to mgmt.   History of creation of ostomy [facility]) Developed toxic megacolon during most recent hospitalization and underwent colectomy with ileostomy.  Ostomy appears normal today. Has nurse check on it once a week.  Maintains it himself otherwise. Has followed up with surgeon.  Has not followed up with GI for toxic megacolon. Encouraged pt to follow up with his GI physician.  No changes in mgmt today.   Follow up in 6 months.   Clemetine Marker, MD PGY-2 [facility] Res[facility]

## 2019-01-17 NOTE — Patient Instructions (Signed)
It was great to see you again today,   I'm glad to see you are doing better.  I did not make any changes to your medication today.  I would like to see you again in 6 months.  If you have any issues sooner than that, please schedule an appointment.   Have a great day,   Clemetine Marker, MD

## 2019-01-22 DIAGNOSIS — Z939 Artificial opening status, unspecified: Secondary | ICD-10-CM | POA: Insufficient documentation

## 2019-01-22 NOTE — Assessment & Plan Note (Signed)
Appears to have resolved with no noticeable long term change in gait/movement.  Pt denies residual pain in the area. No changes today.

## 2019-01-22 NOTE — Assessment & Plan Note (Signed)
Developed toxic megacolon during most recent hospitalization and underwent colectomy with ileostomy.  Ostomy appears normal today. Has nurse check on it once a week.  Maintains it himself otherwise. Has followed up with surgeon.  Has not followed up with GI for toxic megacolon. Encouraged pt to follow up with his GI physician.  No changes in mgmt today.

## 2019-01-22 NOTE — Assessment & Plan Note (Signed)
No complaints. Making urine.  Labs have been normal as recently as 4 weeks ago.  Sees his transplant physician regularly at [facility]  No changes to mgmt.

## 2019-01-27 ENCOUNTER — Other Ambulatory Visit: Payer: Self-pay | Admitting: Adult Health

## 2019-01-27 DIAGNOSIS — Z940 Kidney transplant status: Secondary | ICD-10-CM

## 2019-01-27 DIAGNOSIS — B20 Human immunodeficiency virus [HIV] disease: Secondary | ICD-10-CM

## 2019-01-29 ENCOUNTER — Other Ambulatory Visit: Payer: Self-pay | Admitting: Adult Health

## 2019-01-29 DIAGNOSIS — B20 Human immunodeficiency virus [HIV] disease: Secondary | ICD-10-CM

## 2019-01-30 ENCOUNTER — Other Ambulatory Visit: Payer: Self-pay | Admitting: Adult Health

## 2019-01-30 DIAGNOSIS — B20 Human immunodeficiency virus [HIV] disease: Secondary | ICD-10-CM

## 2019-05-20 ENCOUNTER — Ambulatory Visit (HOSPITAL_COMMUNITY)
Admission: EM | Admit: 2019-05-20 | Discharge: 2019-05-20 | Disposition: A | Discharge status: Home / Self Care | Payer: Medicare Other | Attending: Family Medicine | Admitting: Family Medicine

## 2019-05-20 ENCOUNTER — Encounter (HOSPITAL_COMMUNITY): Payer: Self-pay

## 2019-05-20 ENCOUNTER — Other Ambulatory Visit: Payer: Self-pay

## 2019-05-20 DIAGNOSIS — N390 Urinary tract infection, site not specified: Secondary | ICD-10-CM

## 2019-05-20 DIAGNOSIS — I10 Essential (primary) hypertension: Secondary | ICD-10-CM | POA: Diagnosis not present

## 2019-05-20 DIAGNOSIS — Z9483 Pancreas transplant status: Secondary | ICD-10-CM | POA: Insufficient documentation

## 2019-05-20 DIAGNOSIS — R39198 Other difficulties with micturition: Secondary | ICD-10-CM | POA: Diagnosis not present

## 2019-05-20 DIAGNOSIS — Y830 Surgical operation with transplant of whole organ as the cause of abnormal reaction of the patient, or of later complication, without mention of misadventure at the time of the procedure: Secondary | ICD-10-CM | POA: Diagnosis not present

## 2019-05-20 DIAGNOSIS — E785 Hyperlipidemia, unspecified: Secondary | ICD-10-CM | POA: Insufficient documentation

## 2019-05-20 DIAGNOSIS — Z79899 Other long term (current) drug therapy: Secondary | ICD-10-CM | POA: Diagnosis not present

## 2019-05-20 DIAGNOSIS — Z7982 Long term (current) use of aspirin: Secondary | ICD-10-CM | POA: Insufficient documentation

## 2019-05-20 DIAGNOSIS — T8613 Kidney transplant infection: Secondary | ICD-10-CM | POA: Diagnosis not present

## 2019-05-20 DIAGNOSIS — Z7952 Long term (current) use of systemic steroids: Secondary | ICD-10-CM | POA: Diagnosis not present

## 2019-05-20 LAB — POCT URINALYSIS DIP (DEVICE)
Bilirubin Urine: NEGATIVE
Glucose, UA: NEGATIVE mg/dL
Ketones, ur: NEGATIVE mg/dL
Nitrite: NEGATIVE
Protein, ur: 100 mg/dL — AB
Specific Gravity, Urine: 1.025 (ref 1.005–1.030)
Urobilinogen, UA: 0.2 mg/dL (ref 0.0–1.0)
pH: 6 (ref 5.0–8.0)

## 2019-05-20 MED ORDER — ACETAMINOPHEN 325 MG PO TABS
ORAL_TABLET | ORAL | Status: AC
  Filled 2019-05-20: qty 2

## 2019-05-20 MED ORDER — LIDOCAINE HCL (PF) 1 % IJ SOLN
INTRAMUSCULAR | Status: AC
  Filled 2019-05-20: qty 2

## 2019-05-20 MED ORDER — CEFTRIAXONE SODIUM 1 G IJ SOLR
1.0000 g | Freq: Once | INTRAMUSCULAR | Status: AC
  Administered 2019-05-20: 1 g via INTRAMUSCULAR

## 2019-05-20 MED ORDER — CEPHALEXIN 500 MG PO CAPS: 500.0000 mg | ORAL_CAPSULE | Freq: Two times a day (BID) | ORAL | 0 refills | Status: DC

## 2019-05-20 MED ORDER — ACETAMINOPHEN 325 MG PO TABS
650.0000 mg | ORAL_TABLET | Freq: Once | ORAL | Status: AC
  Administered 2019-05-20: 650 mg via ORAL

## 2019-05-20 MED ORDER — CEFTRIAXONE SODIUM 1 G IJ SOLR
INTRAMUSCULAR | Status: AC
  Filled 2019-05-20: qty 10

## 2019-05-20 NOTE — ED Triage Notes (Signed)
Pt states he spoke to his PCP and he wanted him to stop by the urgent care and give a urine sample and get a U/A done. Pt states he has been voiding a lot but the urine has been coming out slow.  This has been going on for 3 days. Pt state he saw some blood in his urine.

## 2019-05-20 NOTE — Discharge Instructions (Signed)
You are being treated with antibiotics for a kidney infection Take the cephalexin 2 times a day Try to drink more water I am sending your urine for culture.  We will call you if any change in antibiotics is indicated I called [name] in infectious disease.  He is aware of your treatment Go to ER if worse instead of better, increasing fever, increasing nausea vomiting or pain, increasing weakness

## 2019-05-20 NOTE — ED Provider Notes (Signed)
[facility]    CSN: [id number] Arrival date & time: 05/20/19  1026      History   Chief Complaint Chief Complaint  Patient presents with  . PCP ordered a UA    HPI Kenneth [name] is a 64 y.o. male.   HPI  This is a 64 year old gentleman with multiple medical problems. 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  He states that his urine output seems slow to him.  He saw some pink tinge to his urine. He called his transplant doctors.  The told him to get medical care today analysis and urine culture. No abdominal pain.  No flank pain.  No nausea or vomiting.  Appetite is diminished.  He has tried to drink fluids but states he is not doing very well.  Past Medical History:  Diagnosis Date  . Achalasia   . Candida infection, esophageal ([place]) 08/2010   a. 2012 - noted on EGD.  Marland Kitchen DVT (deep venous thrombosis) ([place]) ~ 04/2014   ?RLE  . Dysphagia 2008   post heller myotomy/toupee fundoplication.    . ESRD (end stage renal disease) on dialysis [facility])    "MWF: [name]" (01/22/2017)  . Fall 01/22/2017   mechanical fall w/multiple fractures/notes 01/22/2017  . GERD (gastroesophageal reflux disease)    hx (01/22/2017)  . Hemorrhoids, internal   . History of blood transfusion 03/2014   "low counts"  . History of gout   . History of stomach ulcers   . HIV infection ([place]) dx ~ 2009   a. 02/13/2014 CD4 = 240 - undetectable viral load.  . Hyperlipidemia   . Hypertension    hx (01/22/2017)  . Insomnia   . Lupus  nephritis ([place])   . Pancytopenia ([place])   . Premature ventricular contractions   . Renal abscess   . SLE (systemic lupus erythematosus) ([place]) dx'd 2015    Patient Active Problem List   Diagnosis Date Noted  . History of creation of ostomy ([place]) 01/22/2019  . Volvulus of sigmoid colon S/P colostomy([facility]) 12/31/2018  . Adult failure to thrive 12/04/2018  . Greater trochanter fracture ([place]) 10/23/2018  . Severe sepsis ([place]) 09/29/2018  . Clostridium enterocolitis 09/29/2018  . ATN (acute tubular necrosis) ([place]) 09/29/2018  . Ileus ([place]) 09/29/2018  . Leucopenia   . Current chronic use of systemic steroids 09/24/2018  . History of esophageal ulcer 09/21/2018  . History of renal transplant 09/19/2018  . Pelvic fracture ([place]) 08/25/2018  . Multiple falls 08/25/2018  . Thumb fracture 08/25/2018  . Closed nondisplaced zone I fracture of sacrum ([place]) 08/22/2018  . Tachycardia 08/22/2018  . Suprapubic pain 04/11/2018  . Osteoporosis 03/15/2017  . Chronic fatigue 03/11/2015  . SLE (systemic lupus erythematosus) ([place]) 02/24/2015  . AKI (acute kidney injury) ([place])   . Dysphagia   . ESRD (end stage renal disease) ([place])   . DVT (deep venous thrombosis) ([place]) 06/22/2014  . PVC's (premature ventricular contractions) 03/17/2014  . Abdominal pain   . Protein-calorie malnutrition, severe ([place]) 02/11/2014  . Lupus nephritis ([place])   . Arthralgia of  multiple sites, bilateral 02/02/2014  . Bilateral low back pain without sciatica 02/01/2014  . EXTERNAL HEMORRHOIDS WITHOUT MENTION COMP 01/20/2009  . Macrocytic anemia 08/31/2006  . HIV disease ([place]) 07/24/2006  . Hyperlipidemia 05/31/2006  . ERECTILE DYSFUNCTION 05/31/2006  . INSOMNIA NOS 05/31/2006    Past Surgical History:  Procedure Laterality Date  . BASCILIC VEIN TRANSPOSITION Left 06/29/2014   Procedure: FIRST STAGE  [place];  Surgeon: Rosetta Posner, MD;  Location: [place];  Service: Vascular;  Laterality: Left;  .  BASCILIC VEIN TRANSPOSITION Left 09/11/2014   Procedure: SECOND STAGE BASILIC VEIN TRANSPOSITION LEFT UPPER ARM;  Surgeon: Rosetta Posner, MD;  Location: [place];  Service: Vascular;  Laterality: Left;  . BOWEL DECOMPRESSION N/A 09/29/2018   Procedure: BOWEL DECOMPRESSION;  Surgeon: Laurence Spates, MD;  Location: [place];  Service: Endoscopy;  Laterality: N/A;  . COLONOSCOPY  2008   .  tortuous colon, internal hemorrhoids.  no polyps or diverticulosis.   Marland Kitchen ESOPHAGOGASTRODUODENOSCOPY  08/2010   Dr [name].  08/2010 candida esophagitis, no obvious esophageal stricture. 02/2006 and 05/2006 dilated esophagus, narrowed distal esophagus but no stricture, was empirically [place] dilated  . ESOPHAGOGASTRODUODENOSCOPY N/A 06/27/2014   Procedure: ESOPHAGOGASTRODUODENOSCOPY (EGD);  Surgeon: [name], MD;  Location: [facility] ENDOSCOPY;  Service: Endoscopy;  Laterality: N/A;  . ESOPHAGOGASTRODUODENOSCOPY N/A 07/02/2014   Procedure: ESOPHAGOGASTRODUODENOSCOPY (EGD);  Surgeon: [name], MD;  Location: [facility] ENDOSCOPY;  Service: Endoscopy;  Laterality: N/A;  with botox  . FLEXIBLE SIGMOIDOSCOPY N/A 09/29/2018   Procedure: FLEXIBLE SIGMOIDOSCOPY;  Surgeon: Laurence Spates, MD;  Location: [place];  Service: Endoscopy;  Laterality: N/A;  . [place]   with toupee fundoplication of HH.   . INSERTION OF DIALYSIS CATHETER Right 06/29/2014   Procedure: INSERTION OF DIALYSIS CATHETER RIGHT IJ;  Surgeon: Rosetta Posner, MD;  Location: [place];  Service: Vascular;  Laterality: Right;  . KIDNEY TRANSPLANT     January 2020 @ [facility]  . NEPHRECTOMY Right ~ 2011  . ORIF TIBIA PLATEAU Right 01/23/2017   Procedure: OPEN REDUCTION INTERNAL FIXATION (ORIF) TIBIAL PLATEAU; REPAIR OF LATERAL TIBIAL PLATEAU; ARTHROTOMY WITH MENISCUS REPAIR; ANTERIOR COMPARTMENT FASCIOTOMY;  Surgeon: Altamese Livengood, MD;  Location: [facility];  Service: Orthopedics;  Laterality: Right;       Home Medications    Prior to Admission medications     Medication Sig Start Date End Date Taking? Authorizing Provider  abacavir-dolutegravir-lamiVUDine (TRIUMEQ) 600-50-300 MG tablet Take 1 tablet by mouth daily. 01/02/19   Medina-Vargas, Monina C, NP  aspirin 81 MG chewable tablet Chew 81 mg by mouth daily.    [provider]  cephALEXin (KEFLEX) 500 MG capsule Take 1 capsule (500 mg total) by mouth 2 (two) times daily. 05/20/19   Raylene Everts, MD  Cholecalciferol (VITAMIN D3) 125 MCG (5000 UT) CAPS Take 1 capsule by mouth daily.    [provider]  Methylphenidate HCl 2.5 MG CHEW Chew 1 tablet (2.5 mg total) by mouth 2 (two) times daily. Lunch and dinner 12/20/18 01/19/19  Hendricks Limes, MD  mirtazapine (REMERON) 30 MG tablet Take 1 tablet (30 mg total) by mouth at bedtime. 01/01/19 01/31/19  Medina-Vargas, Monina C, NP  Nutritional Supplement LIQD Take 120 mLs by mouth 3 (three) times daily. [employer]    [provider]  pantoprazole (PROTONIX) 40 MG tablet Take 1 tablet (40 mg total) by mouth 2 (two) times daily. 01/01/19   Medina-Vargas, Monina C, NP  pentamidine (PENTAM) 300 MG  inhalation solution Take 300 mg by nebulization See admin instructions. Inhale via nebulizer every 28 days 01/01/19   Medina-Vargas, Monina C, NP  predniSONE (DELTASONE) 10 MG tablet Take 1 tablet (10 mg total) by mouth daily. 01/01/19   Medina-Vargas, Monina C, NP  tacrolimus (PROGRAF) 1 MG capsule Take 1 capsule (1 mg total) by mouth every 12 (twelve) hours. 01/01/19   Medina-Vargas, Senaida Lange, NP    Family History Family History  Problem Relation Age of Onset  . Colon cancer Father        deceased  . Other Mother        s/p pacemaker - alive and well.  . Hypertension Mother   . Other Other        5 brothers, 3 sisters - alive and well.    Social History Social History   Tobacco Use  . Smoking status: Never Smoker  . Smokeless tobacco: Never Used  Substance Use Topics  . Alcohol use: No  . Drug use: No     Allergies    Patient has no known allergies.   Review of Systems Review of Systems  Constitutional: Positive for appetite change, fatigue and fever.  Genitourinary: Positive for difficulty urinating and hematuria.  Neurological: Positive for weakness and light-headedness.     Physical Exam Triage Vital Signs ED Triage Vitals  Enc Vitals Group     BP 05/20/19 1101 126/74     Pulse Rate 05/20/19 1101 95     Resp 05/20/19 1101 16     Temp 05/20/19 1101 (!) 100.9 F (38.3 C)     Temp src --      SpO2 05/20/19 1101 100 %     Weight 05/20/19 1100 144 lb (65.3 kg)     Height --      Head Circumference --      Peak Flow --      Pain Score 05/20/19 1100 0     Pain Loc --      Pain Edu? --      Excl. in [place]? --    No data found.  Updated Vital Signs BP 126/74 (BP Location: Right Arm)   Pulse 95   Temp (!) 100.9 F (38.3 C)   Resp 16   Wt 65.3 kg   SpO2 100%   BMI 20.08 kg/m      Physical Exam Constitutional:      General: He is not in acute distress.    Appearance: He is well-developed and normal weight. He is ill-appearing.     Comments: Patient is thin.  Frail.  Pleasant  HENT:     Head: Normocephalic and atraumatic.     Mouth/Throat:     Comments: Mask in place.  Mouth examined, mucous membranes dry Eyes:     Conjunctiva/sclera: Conjunctivae normal.     Pupils: Pupils are equal, round, and reactive to light.  Cardiovascular:     Rate and Rhythm: Normal rate and regular rhythm.     Heart sounds: Murmur present.  Pulmonary:     Effort: Pulmonary effort is normal. No respiratory distress.     Breath sounds: Normal breath sounds.  Abdominal:     General: Bowel sounds are normal. There is no distension.     Palpations: Abdomen is soft.     Tenderness: There is no abdominal tenderness.     Comments: Scarring in abdomen.  Colostomy in place.  Soft brown stool.  It appears to be leaking as there is  stool on the underside of his shirt.  Musculoskeletal:        General: Normal  range of motion.     Cervical back: Normal range of motion and neck supple.     Right lower leg: No edema.     Left lower leg: No edema.  Skin:    General: Skin is warm and dry.  Neurological:     General: No focal deficit present.     Mental Status: He is alert.  Psychiatric:        Mood and Affect: Mood normal.        Behavior: Behavior normal.      UC Treatments / Results  Labs (all labs ordered are listed, but only abnormal results are displayed) Labs Reviewed  POCT URINALYSIS DIP (DEVICE) - Abnormal; Notable for the following components:      Result Value   Hgb urine dipstick SMALL (*)    Protein, ur 100 (*)    Leukocytes,Ua SMALL (*)    All other components within normal limits  URINE CULTURE    EKG   Radiology No results found.  Procedures Procedures (including critical care time)  Medications Ordered in UC Medications  acetaminophen (TYLENOL) tablet 650 mg (650 mg Oral Given 05/20/19 1107)  cefTRIAXone (ROCEPHIN) injection 1 g (1 g Intramuscular Given 05/20/19 1220)    Initial Impression / Assessment and Plan / UC Course  I have reviewed the triage vital signs and the nursing notes.  Pertinent labs & imaging results that were available during my care of the patient were reviewed by me and considered in my medical decision making (see chart for details).     Patient has fever.  Abnormal urinalysis. I called [name] in infectious disease.  Discussed Mr. Everette illness.  He recommends a shot of Rocephin and then can use cephalexin as an initial antibiotic.  Culture pending Final Clinical Impressions(s) / UC Diagnoses   Final diagnoses:  Kidney transplant infection     Discharge Instructions     You are being treated with antibiotics for a kidney infection Take the cephalexin 2 times a day Try to drink more water I am sending your urine for culture.  We will call you if any change in antibiotics is indicated I called [name] in infectious  disease.  He is aware of your treatment Go to ER if worse instead of better, increasing fever, increasing nausea vomiting or pain, increasing weakness   ED Prescriptions    Medication Sig Dispense Auth. Provider   cephALEXin (KEFLEX) 500 MG capsule Take 1 capsule (500 mg total) by mouth 2 (two) times daily. 20 capsule Raylene Everts, MD     PDMP not reviewed this encounter.   Raylene Everts, MD 05/20/19 [phone]

## 2019-05-22 ENCOUNTER — Emergency Department (HOSPITAL_COMMUNITY)
Admission: EM | Admit: 2019-05-22 | Discharge: 2019-05-22 | Disposition: A | Discharge status: Other Acute Inpatient Hospital | Payer: Medicare Other | Attending: Emergency Medicine | Admitting: Emergency Medicine

## 2019-05-22 ENCOUNTER — Encounter (HOSPITAL_COMMUNITY): Payer: Self-pay | Admitting: Emergency Medicine

## 2019-05-22 ENCOUNTER — Other Ambulatory Visit: Payer: Self-pay

## 2019-05-22 DIAGNOSIS — N179 Acute kidney failure, unspecified: Secondary | ICD-10-CM | POA: Insufficient documentation

## 2019-05-22 DIAGNOSIS — Z940 Kidney transplant status: Secondary | ICD-10-CM

## 2019-05-22 DIAGNOSIS — I120 Hypertensive chronic kidney disease with stage 5 chronic kidney disease or end stage renal disease: Secondary | ICD-10-CM | POA: Diagnosis not present

## 2019-05-22 DIAGNOSIS — Z7982 Long term (current) use of aspirin: Secondary | ICD-10-CM | POA: Diagnosis not present

## 2019-05-22 DIAGNOSIS — E860 Dehydration: Secondary | ICD-10-CM | POA: Insufficient documentation

## 2019-05-22 DIAGNOSIS — R197 Diarrhea, unspecified: Secondary | ICD-10-CM | POA: Diagnosis not present

## 2019-05-22 DIAGNOSIS — M6281 Muscle weakness (generalized): Secondary | ICD-10-CM | POA: Diagnosis present

## 2019-05-22 DIAGNOSIS — Z79899 Other long term (current) drug therapy: Secondary | ICD-10-CM | POA: Diagnosis not present

## 2019-05-22 DIAGNOSIS — N186 End stage renal disease: Secondary | ICD-10-CM | POA: Diagnosis not present

## 2019-05-22 DIAGNOSIS — Z20822 Contact with and (suspected) exposure to covid-19: Secondary | ICD-10-CM | POA: Insufficient documentation

## 2019-05-22 LAB — BASIC METABOLIC PANEL
Anion gap: 16 — ABNORMAL HIGH (ref 5–15)
BUN: 55 mg/dL — ABNORMAL HIGH (ref 8–23)
CO2: 20 mmol/L — ABNORMAL LOW (ref 22–32)
Calcium: 9.4 mg/dL (ref 8.9–10.3)
Chloride: 102 mmol/L (ref 98–111)
Creatinine, Ser: 4.57 mg/dL — ABNORMAL HIGH (ref 0.61–1.24)
GFR calc Af Amer: 15 mL/min — ABNORMAL LOW (ref >60)
GFR calc non Af Amer: 13 mL/min — ABNORMAL LOW (ref >60)
Glucose, Bld: 114 mg/dL — ABNORMAL HIGH (ref 70–99)
Potassium: 4.8 mmol/L (ref 3.5–5.1)
Sodium: 138 mmol/L (ref 135–145)

## 2019-05-22 LAB — URINALYSIS, ROUTINE W REFLEX MICROSCOPIC
Bilirubin Urine: NEGATIVE
Glucose, UA: NEGATIVE mg/dL
Hgb urine dipstick: NEGATIVE
Ketones, ur: NEGATIVE mg/dL
Nitrite: NEGATIVE
Protein, ur: 30 mg/dL — AB
Specific Gravity, Urine: 1.018 (ref 1.005–1.030)
pH: 5 (ref 5.0–8.0)

## 2019-05-22 LAB — RESPIRATORY PANEL BY RT PCR (FLU A&B, COVID)
Influenza A by PCR: NEGATIVE
Influenza B by PCR: NEGATIVE
SARS Coronavirus 2 by RT PCR: NEGATIVE

## 2019-05-22 LAB — CBC WITH DIFFERENTIAL/PLATELET
Abs Immature Granulocytes: 0.02 10*3/uL (ref 0.00–0.07)
Basophils Absolute: 0 10*3/uL (ref 0.0–0.1)
Basophils Relative: 0 %
Eosinophils Absolute: 0 10*3/uL (ref 0.0–0.5)
Eosinophils Relative: 0 %
HCT: 42.2 % (ref 39.0–52.0)
Hemoglobin: 13.8 g/dL (ref 13.0–17.0)
Immature Granulocytes: 0 %
Lymphocytes Relative: 12 %
Lymphs Abs: 0.7 10*3/uL (ref 0.7–4.0)
MCH: 32.4 pg (ref 26.0–34.0)
MCHC: 32.7 g/dL (ref 30.0–36.0)
MCV: 99.1 fL (ref 80.0–100.0)
Monocytes Absolute: 0.9 10*3/uL (ref 0.1–1.0)
Monocytes Relative: 14 %
Neutro Abs: 4.4 10*3/uL (ref 1.7–7.7)
Neutrophils Relative %: 74 %
Platelets: 150 10*3/uL (ref 150–400)
RBC: 4.26 MIL/uL (ref 4.22–5.81)
RDW: 14.2 % (ref 11.5–15.5)
WBC: 6 10*3/uL (ref 4.0–10.5)
nRBC: 0 % (ref 0.0–0.2)

## 2019-05-22 LAB — URINE CULTURE: Culture: >=100000 — AB

## 2019-05-22 LAB — C DIFFICILE QUICK SCREEN W PCR REFLEX
C Diff antigen: NEGATIVE
C Diff interpretation: NOT DETECTED
C Diff toxin: NEGATIVE

## 2019-05-22 LAB — SARS CORONAVIRUS 2 (TAT 6-24 HRS): SARS Coronavirus 2: NEGATIVE

## 2019-05-22 MED ORDER — SODIUM CHLORIDE 0.9 % IV BOLUS
2000.0000 mL | Freq: Once | INTRAVENOUS | Status: AC
  Administered 2019-05-22: 2000 mL via INTRAVENOUS

## 2019-05-22 MED ORDER — SODIUM CHLORIDE 0.9 % IV SOLN
1.0000 g | Freq: Once | INTRAVENOUS | Status: AC
  Administered 2019-05-22: 1 g via INTRAVENOUS
  Filled 2019-05-22: qty 10

## 2019-05-22 NOTE — ED Provider Notes (Signed)
[facility] EMERGENCY DEPARTMENT Provider Note   CSN: [id number] Arrival date & time: 05/22/19  1146     History No chief complaint on file.   Kenneth [name] is a 64 y.o. male.  HPI    64 year old male with generalized weakness.  Onset about a week ago and progressively worsening.  He has been having very watery stools.  Emptying his ostomy bag every 2 hours or so.  He was seen in urgent care 2 days ago.  He was diagnosed with a urinary tract infection and has been taking cephalexin since then.  Subsequent urine cultures grew out Klebsiella sensitive to cephalosporins.  He feels like he has not really improved though.  His urinary output has decreased.  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  He called his transplant team and they recommended he go to the emergency room.  Because of the weather he stayed locally. 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    Past Medical History:  Diagnosis Date  . Achalasia   . Candida infection, esophageal ([place]) 08/2010   a. 2012 - noted on EGD.  Marland Kitchen DVT (deep venous thrombosis) ([place]) ~ 04/2014   ?RLE  . Dysphagia 2008   post heller myotomy/toupee fundoplication.    . ESRD (end stage renal disease) on dialysis [facility])    "MWF: [name]" (01/22/2017)  . Fall 01/22/2017   mechanical fall w/multiple fractures/notes 01/22/2017  . GERD (gastroesophageal reflux disease)    hx (01/22/2017)  . Hemorrhoids, internal   . History of blood transfusion 03/2014   "low counts"  . History of gout   . History of stomach ulcers   . HIV infection ([place]) dx ~ 2009   a. 02/13/2014 CD4 = 240 - undetectable viral load.  . Hyperlipidemia   . Hypertension    hx (01/22/2017)  . Insomnia   . Lupus nephritis ([place])   . Pancytopenia ([place])   . Premature ventricular contractions   .  Renal abscess   . SLE (systemic lupus erythematosus) ([place]) dx'd 2015   Patient Active Problem List   Diagnosis Date Noted  . History of creation of ostomy ([place]) 01/22/2019  . Volvulus of sigmoid colon S/P colostomy(HCC) 12/31/2018  . Adult failure to thrive 12/04/2018  . Greater trochanter fracture ([place]) 10/23/2018  . Severe sepsis ([place]) 09/29/2018  . Clostridium enterocolitis 09/29/2018  . ATN (acute tubular necrosis) ([place]) 09/29/2018  . Ileus ([place]) 09/29/2018  . Leucopenia   . Current chronic use of systemic steroids 09/24/2018  . History of esophageal ulcer 09/21/2018  . History of renal transplant 09/19/2018  . Pelvic fracture ([place]) 08/25/2018  . Multiple falls 08/25/2018  . Thumb fracture 08/25/2018  . Closed nondisplaced zone I fracture of sacrum ([place]) 08/22/2018  . Tachycardia 08/22/2018  . Suprapubic pain 04/11/2018  . Osteoporosis 03/15/2017  . Chronic fatigue 03/11/2015  . SLE (systemic lupus erythematosus) ([place]) 02/24/2015  . AKI (acute kidney injury) ([place])   . Dysphagia   . ESRD (end stage renal disease) ([place])   . DVT (deep venous thrombosis) ([place]) 06/22/2014  . PVC's (premature ventricular contractions) 03/17/2014  . Abdominal pain   . Protein-calorie malnutrition, severe ([place]) 02/11/2014  . Lupus nephritis ([place])   . Arthralgia of multiple sites, bilateral 02/02/2014  . Bilateral low back pain without sciatica 02/01/2014  .  EXTERNAL HEMORRHOIDS WITHOUT MENTION COMP 01/20/2009  . Macrocytic anemia 08/31/2006  . HIV disease ([place]) 07/24/2006  . Hyperlipidemia 05/31/2006  . ERECTILE DYSFUNCTION 05/31/2006  . INSOMNIA NOS 05/31/2006   Past Surgical History:  Procedure Laterality Date  . BASCILIC VEIN TRANSPOSITION Left 06/29/2014   Procedure: FIRST STAGE  [place];  Surgeon: Rosetta Posner, MD;  Location: [facility];  Service: Vascular;  Laterality: Left;  . BASCILIC VEIN TRANSPOSITION Left 09/11/2014   Procedure: SECOND STAGE BASILIC VEIN  TRANSPOSITION LEFT UPPER ARM;  Surgeon: Rosetta Posner, MD;  Location: [facility];  Service: Vascular;  Laterality: Left;  . BOWEL DECOMPRESSION N/A 09/29/2018   Procedure: BOWEL DECOMPRESSION;  Surgeon: Laurence Spates, MD;  Location: [facility];  Service: Endoscopy;  Laterality: N/A;  . COLONOSCOPY  2008   .  tortuous colon, internal hemorrhoids.  no polyps or diverticulosis.   Marland Kitchen ESOPHAGOGASTRODUODENOSCOPY  08/2010   Dr [name].  08/2010 candida esophagitis, no obvious esophageal stricture. 02/2006 and 05/2006 dilated esophagus, narrowed distal esophagus but no stricture, was empirically Savary dilated  . ESOPHAGOGASTRODUODENOSCOPY N/A 06/27/2014   Procedure: ESOPHAGOGASTRODUODENOSCOPY (EGD);  Surgeon: [name], MD;  Location: [facility] ENDOSCOPY;  Service: Endoscopy;  Laterality: N/A;  . ESOPHAGOGASTRODUODENOSCOPY N/A 07/02/2014   Procedure: ESOPHAGOGASTRODUODENOSCOPY (EGD);  Surgeon: [name], MD;  Location: [facility] ENDOSCOPY;  Service: Endoscopy;  Laterality: N/A;  with botox  . FLEXIBLE SIGMOIDOSCOPY N/A 09/29/2018   Procedure: FLEXIBLE SIGMOIDOSCOPY;  Surgeon: Laurence Spates, MD;  Location: [facility];  Service: Endoscopy;  Laterality: N/A;  . [place]   with toupee fundoplication of HH.   . INSERTION OF DIALYSIS CATHETER Right 06/29/2014   Procedure: INSERTION OF DIALYSIS CATHETER RIGHT IJ;  Surgeon: Rosetta Posner, MD;  Location: [facility];  Service: Vascular;  Laterality: Right;  . KIDNEY TRANSPLANT     January 2020 @ [facility]  . NEPHRECTOMY Right ~ 2011  . ORIF TIBIA PLATEAU Right 01/23/2017   Procedure: OPEN REDUCTION INTERNAL FIXATION (ORIF) TIBIAL PLATEAU; REPAIR OF LATERAL TIBIAL PLATEAU; ARTHROTOMY WITH MENISCUS REPAIR; ANTERIOR COMPARTMENT FASCIOTOMY;  Surgeon: Altamese [place], MD;  Location: [facility];  Service: Orthopedics;  Laterality: Right;     Family History  Problem Relation Age of Onset  . Colon cancer Father        deceased  . Other Mother        s/p pacemaker - alive and well.  .  Hypertension Mother   . Other Other        5 brothers, 3 sisters - alive and well.   Social History   Tobacco Use  . Smoking status: Never Smoker  . Smokeless tobacco: Never Used  Substance Use Topics  . Alcohol use: No  . Drug use: No   Home Medications Prior to Admission medications   Medication Sig Start Date End Date Taking? Authorizing Provider  abacavir-dolutegravir-lamiVUDine (TRIUMEQ) 600-50-300 MG tablet Take 1 tablet by mouth daily. 01/02/19  Yes Medina-Vargas, Monina C, NP  amoxicillin (AMOXIL) 500 MG capsule Take 500 mg by mouth once. 02/07/19  Yes [provider]  aspirin 81 MG chewable tablet Chew 81 mg by mouth daily.   Yes [provider]  cephALEXin (KEFLEX) 500 MG capsule Take 1 capsule (500 mg total) by mouth 2 (two) times daily. 05/20/19  Yes Raylene Everts, MD  Cholecalciferol (VITAMIN D3) 125 MCG (5000 UT) CAPS Take 1 capsule by mouth daily.   Yes [provider]  [facility] MELATONIN 3 MG TABS Take  2 tablets by mouth at bedtime. 05/04/19  Yes [provider]  [facility]S VITAMIN B12 1000 MCG tablet Take 1,000 mcg by mouth daily. 05/09/19  Yes [provider]  folic acid (FOLVITE) 1 MG tablet Take 1 mg by mouth daily. 04/13/19  Yes [provider]  hydroxychloroquine (PLAQUENIL) 200 MG tablet Take 200 mg by mouth 2 (two) times daily. 04/27/19  Yes [provider]  pantoprazole (PROTONIX) 40 MG tablet Take 1 tablet (40 mg total) by mouth 2 (two) times daily. 01/01/19  Yes Medina-Vargas, Monina C, NP  predniSONE (DELTASONE) 10 MG tablet Take 1 tablet (10 mg total) by mouth daily. 01/01/19  Yes Medina-Vargas, Monina C, NP  tacrolimus (PROGRAF) 1 MG capsule Take 1 capsule (1 mg total) by mouth every 12 (twelve) hours. 01/01/19  Yes Medina-Vargas, Monina C, NP  Methylphenidate HCl 2.5 MG CHEW Chew 1 tablet (2.5 mg total) by mouth 2 (two) times daily. Lunch and dinner 12/20/18 01/19/19  Hendricks Limes, MD  mirtazapine (REMERON)  30 MG tablet Take 1 tablet (30 mg total) by mouth at bedtime. 01/01/19 01/31/19  Medina-Vargas, Monina C, NP  pentamidine (PENTAM) 300 MG inhalation solution Take 300 mg by nebulization See admin instructions. Inhale via nebulizer every 28 days Patient not taking: Reported on 05/22/2019 01/01/19   Medina-Vargas, Senaida Lange, NP    Allergies    Patient has no known allergies.  Review of Systems   Review of Systems  All systems reviewed and negative, other than as noted in HPI.  Physical Exam Updated Vital Signs BP (!) 127/92   Pulse 90   Temp 97.9 F (36.6 C) (Oral)   Resp 16   Ht 5\' 11"  (1.803 m)   Wt 65.3 kg   SpO2 97%   BMI 20.08 kg/m   Physical Exam Vitals and nursing note reviewed.  Constitutional:      General: He is not in acute distress.    Appearance: He is well-developed.     Comments: Thin/frail appearing but not distressed.  HENT:     Head: Normocephalic and atraumatic.  Eyes:     General:        Right eye: No discharge.        Left eye: No discharge.     Conjunctiva/sclera: Conjunctivae normal.  Cardiovascular:     Rate and Rhythm: Normal rate and regular rhythm.     Heart sounds: Normal heart sounds. No murmur. No friction rub. No gallop.   Pulmonary:     Effort: Pulmonary effort is normal. No respiratory distress.     Breath sounds: Normal breath sounds.  Abdominal:     General: There is no distension.     Palpations: Abdomen is soft.     Tenderness: There is no abdominal tenderness.     Comments: Ostomy with black watery output.  Musculoskeletal:        General: No tenderness.     Cervical back: Neck supple.  Skin:    General: Skin is warm and dry.  Neurological:     Mental Status: He is alert.  Psychiatric:        Behavior: Behavior normal.        Thought Content: Thought content normal.     ED Results / Procedures / Treatments   Labs (all labs ordered are listed, but only abnormal results are displayed) Labs Reviewed  BASIC METABOLIC PANEL  - Abnormal; Notable for the following components:      Result Value  CO2 20 (*)    Glucose, Bld 114 (*)    BUN 55 (*)    Creatinine, Ser 4.57 (*)    GFR calc non Af Amer 13 (*)    GFR calc Af Amer 15 (*)    Anion gap 16 (*)    All other components within normal limits  CULTURE, BLOOD (ROUTINE X 2)  CULTURE, BLOOD (ROUTINE X 2)  SARS CORONAVIRUS 2 (TAT 6-24 HRS)  C DIFFICILE QUICK SCREEN W PCR REFLEX  RESPIRATORY PANEL BY RT PCR (FLU A&B, COVID)  GI PATHOGEN PANEL BY PCR, STOOL  CBC WITH DIFFERENTIAL/PLATELET  URINALYSIS, ROUTINE W REFLEX MICROSCOPIC    EKG None  Radiology No results found.  Procedures Procedures (including critical care time)  Medications Ordered in ED Medications  cefTRIAXone (ROCEPHIN) 1 g in sodium chloride 0.9 % 100 mL IVPB (has no administration in time range)  sodium chloride 0.9 % bolus 2,000 mL (2,000 mLs Intravenous New Bag/Given 05/22/19 1431)    ED Course  I have reviewed the triage vital signs and the nursing notes.  Pertinent labs & imaging results that were available during my care of the patient were reviewed by me and considered in my medical decision making (see chart for details).  Clinical Course as of May 26 936  Thu May 22, 2019  1524 Pt signed out to me by Dr Wilson Singer.  Briefly 64 yo male s/p renal transplant surgery last year at [facility], with complicated post-operative course including C Diff and colostomy, presenting with significant watery output from ostomy bag for past week.  Tx with UTI Tuesday with klebsiella, started on antibiotics.  Here with diminished UOP, fatigue, Cr 4.57 (baseline 1.1 from 4 months ago), elevated BUN.  Attempting to reach out to [facility] Transplant team to determine transfer status.   [MT]  [id number] Patient accepted to [name] by Dr Martyn Malay of their transplant team.  [name] transfer center to set up ED to ED transfer   [MT]  1608 C Diff antigen: NEGATIVE [MT]  1608 C Diff toxin: NEGATIVE [MT]  1608 C Diff  interpretation: No C. difficile detected. [MT]    Clinical Course User Index [MT] Trifan, Carola Rhine, MD   MDM Rules/Calculators/A&P                      [phone] with generalized weakness.  Significant impairment of his renal function.  He is status post renal transplant at [facility].  Will contact their team.  He will be admitted.  IV fluids.  Probably prerenal losses secondary to diarrhea.  Significant watery output from his ostomy.  Denies abdominal pain and exam benign. He is on antibiotics but he states that this diarrhea preceded starting the antibiotics.  Past history of C. difficile.  Will obtain stool studies.  Final Clinical Impression(s) / ED Diagnoses Final diagnoses:  AKI (acute kidney injury) ([place])  Dehydration  Renal transplant, status post  Diarrhea, unspecified type    Rx / DC Orders ED Discharge Orders    None       Virgel Manifold, MD 05/27/19 [phone]

## 2019-05-22 NOTE — ED Notes (Signed)
[name] transport has arrived for transfer.

## 2019-05-22 NOTE — ED Notes (Signed)
Multiple attempts to call report to [facility] ED. Will keep trying.

## 2019-05-22 NOTE — ED Notes (Signed)
ED-ED report given to Lajuana Matte RN, [name] transport contacted, awaiting to hear back an ETA.

## 2019-05-22 NOTE — ED Triage Notes (Signed)
Kidney Transplant per patient was Jan 11th, 2020  -PT reports that when he performed his daily temperature (as ordered by his "transplant kidney doctor,").-He recorded 101, then 100.2, then 100 on the third day.  The "transplant coordinator" told him to go to an urgent care  -He was told that he had a kidney infection by urgent care and was given antibiotics  -Today he called to reports decreased urine output to the transplant coordinator; he was asked to go to [facility], but he did not have transportation; so  he was then instructed to go to the nearest ED for possible transfer to [facility]

## 2019-05-22 NOTE — ED Notes (Signed)
Radiology reports they will be able to do power share for imaging.

## 2019-05-22 NOTE — ED Notes (Signed)
[place] transfer center has called; She has requested that pt be sent with either a disc of imaging or power shared images. Pt is to be allowed 1 visitor during his stay from 7am-9pm daily. Pt will be sent to [facility] [place], [place] [address]. Report to be called to [phone]. If [name] flight transportation services are needed, they can be contacted at [phone]

## 2019-05-23 NOTE — ED Provider Notes (Signed)
Clinical Course as of May 22 1145  Thu May 22, 2019  1524 Pt signed out to me by Dr Wilson Singer.  Briefly 64 yo male s/p renal transplant surgery last year at [facility], with complicated post-operative course including C Diff and colostomy, presenting with significant watery output from ostomy bag for past week.  Tx with UTI Tuesday with klebsiella, started on antibiotics.  Here with diminished UOP, fatigue, Cr 4.57 (baseline 1.1 from 4 months ago), elevated BUN.  Attempting to reach out to [facility] Transplant team to determine transfer status.   [MT]  [id number] Patient accepted to [name] by Dr Martyn Malay of their transplant team.  [name] transfer center to set up ED to ED transfer   [MT]  1608 C Diff antigen: NEGATIVE [MT]  1608 C Diff toxin: NEGATIVE [MT]  1608 C Diff interpretation: No C. difficile detected. [MT]    Clinical Course User Index [MT] Jafari Mckillop, Carola Rhine, MD      Wyvonnia Dusky, MD 05/23/19 [phone]

## 2019-05-25 LAB — GI PATHOGEN PANEL BY PCR, STOOL

## 2019-05-27 LAB — CULTURE, BLOOD (ROUTINE X 2)
Culture: NO GROWTH
Culture: NO GROWTH
Special Requests: ADEQUATE

## 2019-06-26 ENCOUNTER — Ambulatory Visit: Payer: Medicare Other

## 2019-07-06 ENCOUNTER — Other Ambulatory Visit: Payer: Self-pay

## 2019-07-06 ENCOUNTER — Emergency Department (HOSPITAL_COMMUNITY)
Admission: EM | Admit: 2019-07-06 | Discharge: 2019-07-06 | Disposition: A | Discharge status: Other Acute Inpatient Hospital | Payer: BC Managed Care – PPO | Attending: Emergency Medicine | Admitting: Emergency Medicine

## 2019-07-06 DIAGNOSIS — Z992 Dependence on renal dialysis: Secondary | ICD-10-CM | POA: Insufficient documentation

## 2019-07-06 DIAGNOSIS — N186 End stage renal disease: Secondary | ICD-10-CM | POA: Insufficient documentation

## 2019-07-06 DIAGNOSIS — R197 Diarrhea, unspecified: Secondary | ICD-10-CM | POA: Diagnosis not present

## 2019-07-06 DIAGNOSIS — Z21 Asymptomatic human immunodeficiency virus [HIV] infection status: Secondary | ICD-10-CM | POA: Diagnosis not present

## 2019-07-06 DIAGNOSIS — Z7982 Long term (current) use of aspirin: Secondary | ICD-10-CM | POA: Diagnosis not present

## 2019-07-06 DIAGNOSIS — I120 Hypertensive chronic kidney disease with stage 5 chronic kidney disease or end stage renal disease: Secondary | ICD-10-CM | POA: Diagnosis not present

## 2019-07-06 DIAGNOSIS — Z940 Kidney transplant status: Secondary | ICD-10-CM | POA: Diagnosis not present

## 2019-07-06 DIAGNOSIS — Z79899 Other long term (current) drug therapy: Secondary | ICD-10-CM | POA: Insufficient documentation

## 2019-07-06 DIAGNOSIS — Z20822 Contact with and (suspected) exposure to covid-19: Secondary | ICD-10-CM | POA: Insufficient documentation

## 2019-07-06 LAB — CBC
HCT: 41.3 % (ref 39.0–52.0)
Hemoglobin: 13.4 g/dL (ref 13.0–17.0)
MCH: 32.1 pg (ref 26.0–34.0)
MCHC: 32.4 g/dL (ref 30.0–36.0)
MCV: 98.8 fL (ref 80.0–100.0)
Platelets: 237 10*3/uL (ref 150–400)
RBC: 4.18 MIL/uL — ABNORMAL LOW (ref 4.22–5.81)
RDW: 14.5 % (ref 11.5–15.5)
WBC: 15.9 10*3/uL — ABNORMAL HIGH (ref 4.0–10.5)
nRBC: 0 % (ref 0.0–0.2)

## 2019-07-06 LAB — COMPREHENSIVE METABOLIC PANEL
ALT: 13 U/L (ref 0–44)
AST: 18 U/L (ref 15–41)
Albumin: 3.9 g/dL (ref 3.5–5.0)
Alkaline Phosphatase: 65 U/L (ref 38–126)
Anion gap: 20 — ABNORMAL HIGH (ref 5–15)
BUN: 44 mg/dL — ABNORMAL HIGH (ref 8–23)
CO2: 18 mmol/L — ABNORMAL LOW (ref 22–32)
Calcium: 9.9 mg/dL (ref 8.9–10.3)
Chloride: 100 mmol/L (ref 98–111)
Creatinine, Ser: 3.86 mg/dL — ABNORMAL HIGH (ref 0.61–1.24)
GFR calc Af Amer: 18 mL/min — ABNORMAL LOW (ref >60)
GFR calc non Af Amer: 16 mL/min — ABNORMAL LOW (ref >60)
Glucose, Bld: 109 mg/dL — ABNORMAL HIGH (ref 70–99)
Potassium: 4 mmol/L (ref 3.5–5.1)
Sodium: 138 mmol/L (ref 135–145)
Total Bilirubin: 1.1 mg/dL (ref 0.3–1.2)
Total Protein: 8.7 g/dL — ABNORMAL HIGH (ref 6.5–8.1)

## 2019-07-06 LAB — SARS CORONAVIRUS 2 (TAT 6-24 HRS): SARS Coronavirus 2: NEGATIVE

## 2019-07-06 LAB — LIPASE, BLOOD: Lipase: 18 U/L (ref 11–51)

## 2019-07-06 MED ORDER — SODIUM CHLORIDE 0.9% FLUSH
3.0000 mL | Freq: Once | INTRAVENOUS | Status: AC
  Administered 2019-07-06: 17:00:00 3 mL via INTRAVENOUS

## 2019-07-06 MED ORDER — SODIUM CHLORIDE 0.9 % IV BOLUS (SEPSIS)
1000.0000 mL | Freq: Once | INTRAVENOUS | Status: AC
  Administered 2019-07-06: 1000 mL via INTRAVENOUS

## 2019-07-06 MED ORDER — SODIUM CHLORIDE 0.9 % IV SOLN
1000.0000 mL | INTRAVENOUS | Status: DC
  Administered 2019-07-06: 1000 mL via INTRAVENOUS

## 2019-07-06 MED ORDER — SODIUM CHLORIDE 0.9 % IV BOLUS (SEPSIS)
1000.0000 mL | Freq: Once | INTRAVENOUS | Status: AC
  Administered 2019-07-06: 17:00:00 1000 mL via INTRAVENOUS

## 2019-07-06 NOTE — ED Notes (Signed)
Pt has not had a BM since he's been roomed.

## 2019-07-06 NOTE — ED Notes (Signed)
[name] Transfer) called @ 1926-per Dr. Tomi Bamberger called by Levada Dy

## 2019-07-06 NOTE — ED Triage Notes (Signed)
Pt had surgery one week ago, colostomy reversal.  Since being home has had diarrhea 5-6 times during night and 3-4 during day, watery brown.  Pt called surgeon yesterday who recommended ED.  Pt had surgery done at [place] but was unable to make trip to [facility].  Decreased drinking and eating.  Voice is hoarse, which is new.  No cough, shortness of breath, chest pain.   Abd pain is no worse since leaving hospital.

## 2019-07-06 NOTE — ED Provider Notes (Signed)
[place]  EMERGENCY DEPARTMENT Provider Note   CSN: [id number] Arrival date & time: 07/06/19  1523     History Chief Complaint  Patient presents with  . Diarrhea    Kenneth [name] is a 64 y.o. male.  HPI   Patient presents the ED for evaluation of diarrhea.  Patient had a colostomy reversal procedure 1 week ago.  Patient states since that time he has been having diarrhea with it increasing in severity.  Patient states he has had at least 5-6 loose watery stools last night and 3-4 episodes today.  Patient has not been able to eat anything because of lack of appetite but is not having any vomiting.  He is feeling weak and fatigued now.  He denies any pain.  Patient states his voice is now hoarse.  Denies any cough or shortness of breath.  No chest pain.  Past Medical History:  Diagnosis Date  . Achalasia   . Candida infection, esophageal ([place]) 08/2010   a. 2012 - noted on EGD.  Marland Kitchen DVT (deep venous thrombosis) ([place]) ~ 04/2014   ?RLE  . Dysphagia 2008   post heller myotomy/toupee fundoplication.    . ESRD (end stage renal disease) on dialysis [facility])    "MWF: [name]" (01/22/2017)  . Fall 01/22/2017   mechanical fall w/multiple fractures/notes 01/22/2017  . GERD (gastroesophageal reflux disease)    hx (01/22/2017)  . Hemorrhoids, internal   . History of blood transfusion 03/2014   "low counts"  . History of gout   . History of stomach ulcers   . HIV infection ([place]) dx ~ 2009   a. 02/13/2014 CD4 = 240 - undetectable viral load.  . Hyperlipidemia   . Hypertension    hx (01/22/2017)  . Insomnia   . Lupus nephritis ([place])   . Pancytopenia ([place])   . Premature ventricular contractions   . Renal abscess   . SLE (systemic lupus erythematosus) ([place]) dx'd 2015    Patient Active Problem List   Diagnosis Date Noted  . History of creation of ostomy ([place]) 01/22/2019  . Volvulus of sigmoid colon S/P colostomy([facility]) 12/31/2018  . Adult failure to thrive  12/04/2018  . Greater trochanter fracture ([place]) 10/23/2018  . Severe sepsis ([place]) 09/29/2018  . Clostridium enterocolitis 09/29/2018  . ATN (acute tubular necrosis) ([place]) 09/29/2018  . Ileus ([place]) 09/29/2018  . Leucopenia   . Current chronic use of systemic steroids 09/24/2018  . History of esophageal ulcer 09/21/2018  . History of renal transplant 09/19/2018  . Pelvic fracture () 08/25/2018  . Multiple falls 08/25/2018  . Thumb fracture 08/25/2018  . Closed nondisplaced zone I fracture of sacrum ([place]) 08/22/2018  . Tachycardia 08/22/2018  . Suprapubic pain 04/11/2018  . Osteoporosis 03/15/2017  . Chronic fatigue 03/11/2015  . SLE (systemic lupus erythematosus) ([place]) 02/24/2015  . AKI (acute kidney injury) ([place])   . Dysphagia   . ESRD (end stage renal disease) ([place])   . DVT (deep venous thrombosis) ([place]) 06/22/2014  . PVC's (premature ventricular contractions) 03/17/2014  . Abdominal pain   . Protein-calorie malnutrition, severe ([place]) 02/11/2014  . Lupus nephritis ([place])   . Arthralgia of multiple sites, bilateral 02/02/2014  . Bilateral low back pain without sciatica 02/01/2014  . EXTERNAL HEMORRHOIDS WITHOUT MENTION COMP 01/20/2009  . Macrocytic anemia 08/31/2006  . HIV disease ([place]) 07/24/2006  . Hyperlipidemia 05/31/2006  . ERECTILE DYSFUNCTION 05/31/2006  . INSOMNIA NOS 05/31/2006    Past Surgical History:  Procedure Laterality Date  .  BASCILIC VEIN TRANSPOSITION Left 06/29/2014   Procedure: FIRST STAGE  [place];  Surgeon: Rosetta Posner, MD;  Location: [place];  Service: Vascular;  Laterality: Left;  . BASCILIC VEIN TRANSPOSITION Left 09/11/2014   Procedure: SECOND STAGE BASILIC VEIN TRANSPOSITION LEFT UPPER ARM;  Surgeon: Rosetta Posner, MD;  Location: [place];  Service: Vascular;  Laterality: Left;  . BOWEL DECOMPRESSION N/A 09/29/2018   Procedure: BOWEL DECOMPRESSION;  Surgeon: Laurence Spates, MD;  Location: [place];  Service: Endoscopy;   Laterality: N/A;  . COLONOSCOPY  2008   .  tortuous colon, internal hemorrhoids.  no polyps or diverticulosis.   Marland Kitchen ESOPHAGOGASTRODUODENOSCOPY  08/2010   Dr [name].  08/2010 candida esophagitis, no obvious esophageal stricture. 02/2006 and 05/2006 dilated esophagus, narrowed distal esophagus but no stricture, was empirically [place] dilated  . ESOPHAGOGASTRODUODENOSCOPY N/A 06/27/2014   Procedure: ESOPHAGOGASTRODUODENOSCOPY (EGD);  Surgeon: [name], MD;  Location: [facility] ENDOSCOPY;  Service: Endoscopy;  Laterality: N/A;  . ESOPHAGOGASTRODUODENOSCOPY N/A 07/02/2014   Procedure: ESOPHAGOGASTRODUODENOSCOPY (EGD);  Surgeon: [name], MD;  Location[facility] ENDOSCOPY;  Service: Endoscopy;  Laterality: N/A;  with botox  . FLEXIBLE SIGMOIDOSCOPY N/A 09/29/2018   Procedure: FLEXIBLE SIGMOIDOSCOPY;  Surgeon: Laurence Spates, MD;  Location: [place];  Service: Endoscopy;  Laterality: N/A;  . [place]   with toupee fundoplication of HH.   . INSERTION OF DIALYSIS CATHETER Right 06/29/2014   Procedure: INSERTION OF DIALYSIS CATHETER RIGHT IJ;  Surgeon: Rosetta Posner, MD;  Location: [place];  Service: Vascular;  Laterality: Right;  . KIDNEY TRANSPLANT     January 2020 @ [facility]  . NEPHRECTOMY Right ~ 2011  . ORIF TIBIA PLATEAU Right 01/23/2017   Procedure: OPEN REDUCTION INTERNAL FIXATION (ORIF) TIBIAL PLATEAU; REPAIR OF LATERAL TIBIAL PLATEAU; ARTHROTOMY WITH MENISCUS REPAIR; ANTERIOR COMPARTMENT FASCIOTOMY;  Surgeon: Altamese Tem[place]ula, MD;  Location: [place];  Service: Orthopedics;  Laterality: Right;       Family History  Problem Relation Age of Onset  . Colon cancer Father        deceased  . Other Mother        s/p pacemaker - alive and well.  . Hypertension Mother   . Other Other        5 brothers, 3 sisters - alive and well.    Social History   Tobacco Use  . Smoking status: Never Smoker  . Smokeless tobacco: Never Used  Substance Use Topics  . Alcohol use: No  . Drug use: No    Home  Medications Prior to Admission medications   Medication Sig Start Date End Date Taking? Authorizing Provider  abacavir-dolutegravir-lamiVUDine (TRIUMEQ) 600-50-300 MG tablet Take 1 tablet by mouth daily. Patient taking differently: Take 1 tablet by mouth daily with supper.  01/02/19  Yes Medina-Vargas, Monina C, NP  acetaminophen (TYLENOL) 325 MG tablet Take 650 mg by mouth every 6 (six) hours as needed for headache (pain).   Yes [provider]  amoxicillin (AMOXIL) 500 MG capsule Take 2,000 mg by mouth See admin instructions. Take four capsules (2000 mg) by mouth one hour prior to dental appointments 02/07/19  Yes [provider]  aspirin EC 81 MG tablet Take 81 mg by mouth daily.   Yes [provider]  Cholecalciferol (VITAMIN D3) 125 MCG (5000 UT) CAPS Take 5,000 Units by mouth daily.    Yes [provider]  [employer] VITAMIN B12 1000 MCG tablet Take 1,000 mcg by mouth daily. 05/09/19  Yes  [provider]  enoxaparin (LOVENOX) 40 MG/0.4ML injection Inject 40 mg into the skin daily at 12 noon. 07/01/19  Yes [provider]  folic acid (FOLVITE) 1 MG tablet Take 1 mg by mouth daily. 04/13/19  Yes [provider]  hydroxychloroquine (PLAQUENIL) 200 MG tablet Take 200 mg by mouth 2 (two) times daily. 04/27/19  Yes [provider]  Melatonin 3 MG TBDP Place 6 mg under the tongue at bedtime as needed (sleep).  06/03/19  Yes [provider]  pantoprazole (PROTONIX) 40 MG tablet Take 1 tablet (40 mg total) by mouth 2 (two) times daily. Patient taking differently: Take 40 mg by mouth daily.  01/01/19  Yes Medina-Vargas, Monina C, NP  predniSONE (DELTASONE) 5 MG tablet Take 5 mg by mouth daily. 07/04/19  Yes [provider]  tacrolimus (PROGRAF) 0.5 MG capsule Take 1 mg by mouth 2 (two) times daily. 06/03/19  Yes [provider]    Allergies    Patient has no known allergies.  Review of Systems   Review of Systems  All  other systems reviewed and are negative.   Physical Exam Updated Vital Signs BP 109/71   Pulse 94   Temp 98.3 F (36.8 C) (Oral)   Resp (!) 23   Ht 1.803 m (5\' 11" )   Wt 63.5 kg   SpO2 100%   BMI 19.53 kg/m   Physical Exam Vitals and nursing note reviewed.  Constitutional:      General: He is not in acute distress.    Appearance: He is well-developed.  HENT:     Head: Normocephalic and atraumatic.     Right Ear: External ear normal.     Left Ear: External ear normal.  Eyes:     General: No scleral icterus.       Right eye: No discharge.        Left eye: No discharge.     Conjunctiva/sclera: Conjunctivae normal.  Neck:     Trachea: No tracheal deviation.  Cardiovascular:     Rate and Rhythm: Regular rhythm. Tachycardia present.  Pulmonary:     Effort: Pulmonary effort is normal. No respiratory distress.     Breath sounds: Normal breath sounds. No stridor. No wheezing or rales.  Abdominal:     General: Bowel sounds are normal. There is no distension.     Palpations: Abdomen is soft.     Tenderness: There is no abdominal tenderness. There is no guarding or rebound.  Musculoskeletal:        General: No tenderness.     Cervical back: Neck supple.  Skin:    General: Skin is warm and dry.     Findings: No rash.     Comments: Decreased skin turgor  Neurological:     Mental Status: He is alert.     Cranial Nerves: No cranial nerve deficit (no facial droop, extraocular movements intact, no slurred speech).     Sensory: No sensory deficit.     Motor: No abnormal muscle tone or seizure activity.     Coordination: Coordination normal.     ED Results / Procedures / Treatments   Labs (all labs ordered are listed, but only abnormal results are displayed) Labs Reviewed  COMPREHENSIVE METABOLIC PANEL - Abnormal; Notable for the following components:      Result Value   CO2 18 (*)    Glucose, Bld 109 (*)    BUN 44 (*)    Creatinine, Ser 3.86 (*)  Total Protein 8.7  (*)    GFR calc non Af Amer 16 (*)    GFR calc Af Amer 18 (*)    Anion gap 20 (*)    All other components within normal limits  CBC - Abnormal; Notable for the following components:   WBC 15.9 (*)    RBC 4.18 (*)    All other components within normal limits  GI PATHOGEN PANEL BY PCR, STOOL  C DIFFICILE QUICK SCREEN W PCR REFLEX  SARS CORONAVIRUS 2 (TAT 6-24 HRS)  LIPASE, BLOOD  URINALYSIS, ROUTINE W REFLEX MICROSCOPIC    EKG None  Radiology No results found.  Procedures .Critical Care Performed by: Dorie Rank, MD Authorized by: Dorie Rank, MD   Critical care provider statement:    Critical care time (minutes):  45   Critical care was time spent personally by me on the following activities:  Discussions with consultants, evaluation of patient's response to treatment, examination of patient, ordering and performing treatments and interventions, ordering and review of laboratory studies, ordering and review of radiographic studies, pulse oximetry, re-evaluation of patient's condition, obtaining history from patient or surrogate and review of old charts   (including critical care time)  Medications Ordered in ED Medications  sodium chloride 0.9 % bolus 1,000 mL (1,000 mLs Intravenous New Bag/Given 07/06/19 1701)    Followed by  sodium chloride 0.9 % bolus 1,000 mL (1,000 mLs Intravenous New Bag/Given 07/06/19 1701)    Followed by  0.9 %  sodium chloride infusion (1,000 mLs Intravenous New Bag/Given 07/06/19 1701)  sodium chloride flush (NS) 0.9 % injection 3 mL (3 mLs Intravenous Given 07/06/19 1701)    ED Course  I have reviewed the triage vital signs and the nursing notes.  Pertinent labs & imaging results that were available during my care of the patient were reviewed by me and considered in my medical decision making (see chart for details).  Clinical Course as of Jul 05 1921  Nancy Fetter Jul 06, 2019  1636 Cr was 1.8 on March 30th at [facility].  No elevated at 3.86. Bicarb decreased .   Anion gap increased   [JK]  1636 WBC elevated   [JK]  1756 Discussed with [name] regarding possible transfer.   [JK]    Clinical Course User Index [JK] Dorie Rank, MD   MDM Rules/Calculators/A&P                      Patient presented to ED for evaluation of weakness associated with diarrhea.  Patient had recent colostomy reversal surgery at [facility].  Patient has complicated medical conditions including HIV kidney transplant.  Patient's laboratory tests are notable for acute kidney injury associated with dehydration.  Patient's BUN and creatinine is elevated and worsening compared to discharge creatinine at [facility].  Patient has been given IV fluids.  I have ordered stool studies once patient is able to provide a sample.  Patient request to be admitted to [facility].  I spoke with Dr. Jonni Sanger and the patient will be transferred ED to ED for admission and further treatment Final Clinical Impression(s) / ED Diagnoses Final diagnoses:  Diarrhea of presumed infectious origin      Dorie Rank, MD 07/06/19 [id number]

## 2020-06-29 ENCOUNTER — Ambulatory Visit: Payer: BC Managed Care – PPO | Admitting: Diagnostic Neuroimaging

## 2020-08-03 ENCOUNTER — Ambulatory Visit: Payer: BC Managed Care – PPO | Admitting: Diagnostic Neuroimaging

## 2020-08-10 ENCOUNTER — Encounter: Payer: Self-pay | Admitting: *Deleted

## 2020-08-11 ENCOUNTER — Encounter: Payer: Self-pay | Admitting: Diagnostic Neuroimaging

## 2020-08-11 ENCOUNTER — Ambulatory Visit (INDEPENDENT_AMBULATORY_CARE_PROVIDER_SITE_OTHER): Payer: BC Managed Care – PPO | Admitting: Diagnostic Neuroimaging

## 2020-08-11 VITALS — BP 147/78 | HR 76 | Ht 71.0 in | Wt 141.2 lb

## 2020-08-11 DIAGNOSIS — R269 Unspecified abnormalities of gait and mobility: Secondary | ICD-10-CM

## 2020-08-11 DIAGNOSIS — G629 Polyneuropathy, unspecified: Secondary | ICD-10-CM

## 2020-08-11 NOTE — Patient Instructions (Signed)
GAIT DIFF / NEUROPATHY (related to kidney disease; since 2015; stable) - supportive care; consider PT evaluation

## 2020-08-11 NOTE — Progress Notes (Signed)
[facility]  PATIENT: Kenneth [name] DOB: 1956/03/12  REFERRING CLINICIAN: Malena Peer, MD HISTORY FROM: patient  REASON FOR VISIT: new consult    HISTORICAL  CHIEF COMPLAINT:  Chief Complaint  Patient presents with  . Unsteady gait, foot numbness    Rm 7 New Pt "no falls; foot numbness x 5 years- since starting dialysis"    HISTORY OF PRESENT ILLNESS:   65 year old-year-old male with HIV, lupus nephritis, end-stage renal disease status post kidney transplant, here for evaluation of gait and balance difficulty.  Patient developed end-stage renal disease in 2015 around that time developed numbness in the feet and gait and balance difficulty.  He then underwent hemodialysis treatment.  Eventually he was able to get a renal transplant in 2020.  His numbness and gait difficulty have been stable since 2015.  No neck pain or low back pain.  No problems with his knees or hips.  His balance problems mainly occur when he is going up or down steps.   REVIEW OF SYSTEMS: Full 14 system review of systems performed and negative with exception of: As per HPI.  ALLERGIES: Allergies  Allergen Reactions  . Nsaids     Other reaction(s): Unknown MD said not to take    HOME MEDICATIONS: Outpatient Medications Prior to Visit  Medication Sig Dispense Refill  . abacavir-dolutegravir-lamiVUDine (TRIUMEQ) 600-50-300 MG tablet Take 1 tablet by mouth daily. (Patient taking differently: Take 1 tablet by mouth daily with supper.) 30 tablet 0  . aspirin EC 81 MG tablet Take 81 mg by mouth daily.    . Cholecalciferol (VITAMIN D3) 125 MCG (5000 UT) CAPS Take 5,000 Units by mouth daily.     . CVS VITAMIN B12 1000 MCG tablet Take 1,000 mcg by mouth daily.    . folic acid (FOLVITE) 1 MG tablet Take 1 mg by mouth daily.    . hydroxychloroquine (PLAQUENIL) 200 MG tablet Take 200 mg by mouth 2 (two) times daily.    . pantoprazole (PROTONIX) 40 MG tablet Take 1 tablet (40 mg total) by  mouth 2 (two) times daily. (Patient taking differently: Take 40 mg by mouth daily.) 60 tablet 0  . predniSONE (DELTASONE) 5 MG tablet Take 5 mg by mouth daily.    . tacrolimus (PROGRAF) 0.5 MG capsule Take 1 mg by mouth 2 (two) times daily.    . temazepam (RESTORIL) 7.5 MG capsule Take 7.5 mg by mouth at bedtime as needed.    . Melatonin 3 MG TBDP Place 6 mg under the tongue at bedtime as needed (sleep).     Marland Kitchen acetaminophen (TYLENOL) 325 MG tablet Take 650 mg by mouth every 6 (six) hours as needed for headache (pain). (Patient not taking: Reported on 08/11/2020)    . amoxicillin (AMOXIL) 500 MG capsule Take 2,000 mg by mouth See admin instructions. Take four capsules (2000 mg) by mouth one hour prior to dental appointments (Patient not taking: Reported on 08/11/2020)    . enoxaparin (LOVENOX) 40 MG/0.4ML injection Inject 40 mg into the skin daily at 12 noon. (Patient not taking: Reported on 08/11/2020)     No facility-administered medications prior to visit.    PAST MEDICAL HISTORY: Past Medical History:  Diagnosis Date  . Achalasia   . Candida infection, esophageal ([place]) 08/2010   a. 2012 - noted on EGD.  Marland Kitchen DVT (deep venous thrombosis) ([place]) ~ 04/2014   ?RLE  . Dysphagia 2008   post heller myotomy/toupee fundoplication.    Marland Kitchen  ESRD (end stage renal disease) on dialysis [facility])    "MWF: [name]" (01/22/2017)  . Fall 01/22/2017   mechanical fall w/multiple fractures/notes 01/22/2017  . Gait instability   . GERD (gastroesophageal reflux disease)    hx (01/22/2017)  . Hemorrhoids, internal   . History of blood transfusion 03/2014   "low counts"  . History of gout   . History of stomach ulcers   . HIV infection ([place]) dx ~ 2009   a. 02/13/2014 CD4 = 240 - undetectable viral load.  . Hyperlipidemia   . Hypertension    hx (01/22/2017)  . Insomnia   . Lupus nephritis ([place])   . Pancytopenia ([place])   . Premature ventricular contractions   . Renal abscess   . SLE (systemic lupus  erythematosus) ([place]) dx'd 2015    PAST SURGICAL HISTORY: Past Surgical History:  Procedure Laterality Date  . BASCILIC VEIN TRANSPOSITION Left 06/29/2014   Procedure: FIRST STAGE  [place];  Surgeon: Rosetta Posner, MD;  Location: [place];  Service: Vascular;  Laterality: Left;  . BASCILIC VEIN TRANSPOSITION Left 09/11/2014   Procedure: SECOND STAGE BASILIC VEIN TRANSPOSITION LEFT UPPER ARM;  Surgeon: Rosetta Posner, MD;  Location: [place];  Service: Vascular;  Laterality: Left;  . BOWEL DECOMPRESSION N/A 09/29/2018   Procedure: BOWEL DECOMPRESSION;  Surgeon: Laurence Spates, MD;  Location: [place];  Service: Endoscopy;  Laterality: N/A;  . COLONOSCOPY  2008   .  tortuous colon, internal hemorrhoids.  no polyps or diverticulosis.   Marland Kitchen ESOPHAGOGASTRODUODENOSCOPY  08/2010   Dr [name].  08/2010 candida esophagitis, no obvious esophageal stricture. 02/2006 and 05/2006 dilated esophagus, narrowed distal esophagus but no stricture, was empirically [place] dilated  . ESOPHAGOGASTRODUODENOSCOPY N/A 06/27/2014   Procedure: ESOPHAGOGASTRODUODENOSCOPY (EGD);  Surgeon: [name], MD;  Location[facility] ENDOSCOPY;  Service: Endoscopy;  Laterality: N/A;  . ESOPHAGOGASTRODUODENOSCOPY N/A 07/02/2014   Procedure: ESOPHAGOGASTRODUODENOSCOPY (EGD);  Surgeon: [name], MD;  Location: [facility] ENDOSCOPY;  Service: Endoscopy;  Laterality: N/A;  with botox  . FLEXIBLE SIGMOIDOSCOPY N/A 09/29/2018   Procedure: FLEXIBLE SIGMOIDOSCOPY;  Surgeon: Laurence Spates, MD;  Location: [place];  Service: Endoscopy;  Laterality: N/A;  . [place]   with toupee fundoplication of HH.   Marland Kitchen HERNIA REPAIR    . INSERTION OF DIALYSIS CATHETER Right 06/29/2014   Procedure: INSERTION OF DIALYSIS CATHETER RIGHT IJ;  Surgeon: Rosetta Posner, MD;  Location: [place];  Service: Vascular;  Laterality: Right;  . KIDNEY TRANSPLANT     January 2020 @ [facility]  . NEPHRECTOMY Right ~ 2011  . ORIF TIBIA PLATEAU Right 01/23/2017    Procedure: OPEN REDUCTION INTERNAL FIXATION (ORIF) TIBIAL PLATEAU; REPAIR OF LATERAL TIBIAL PLATEAU; ARTHROTOMY WITH MENISCUS REPAIR; ANTERIOR COMPARTMENT FASCIOTOMY;  Surgeon: Altamese Mecca, MD;  Location: [place];  Service: Orthopedics;  Laterality: Right;    FAMILY HISTORY: Family History  Problem Relation Age of Onset  . Colon cancer Father        deceased  . Other Mother        s/p pacemaker - alive and well.  . Hypertension Mother   . Other Other        5 brothers, 3 sisters - alive and well.  . Ovarian cancer Sister   . Prostate cancer Brother     SOCIAL HISTORY: Social History   Socioeconomic History  . Marital status: Single    Spouse name: Not on file  . Number of children: 0  .  Years of education: 76  . Highest education level: Bachelor's degree (e.g., BA, AB, BS)  Occupational History    Comment: IT  Tobacco Use  . Smoking status: Never Smoker  . Smokeless tobacco: Never Used  Vaping Use  . Vaping Use: Never used  Substance and Sexual Activity  . Alcohol use: Never  . Drug use: Never  . Sexual activity: Not Currently    Partners: Male  Other Topics Concern  . Not on file  Social History Narrative   Lives in [place] by himself. Does not routinely exercise.   No caffeine   Social Determinants of Radio broadcast assistant Strain: Not on file  Food Insecurity: Not on file  Transportation Needs: Not on file  Physical Activity: Not on file  Stress: Not on file  Social Connections: Not on file  Intimate Partner Violence: Not on file     PHYSICAL EXAM  GENERAL EXAM/CONSTITUTIONAL: Vitals:  Vitals:   08/11/20 0815  BP: (!) 147/78  Pulse: 76  Weight: 141 lb 3.2 oz (64 kg)  Height: '5\' 11"'$  (1.803 m)   Body mass index is 19.69 kg/m. Wt Readings from Last 3 Encounters:  08/11/20 141 lb 3.2 oz (64 kg)  07/06/19 140 lb (63.5 kg)  05/22/19 143 lb 15.4 oz (65.3 kg)    Patient is in no distress; well developed, nourished and groomed; neck is  supple  CARDIOVASCULAR:  Examination of carotid arteries is normal; no carotid bruits  Regular rate and rhythm, no murmurs  Examination of peripheral vascular system by observation and palpation is normal  EYES:  Ophthalmoscopic exam of optic discs and posterior segments is normal; no papilledema or hemorrhages No exam data present  MUSCULOSKELETAL:  Gait, strength, tone, movements noted in Neurologic exam below  NEUROLOGIC: MENTAL STATUS:  No flowsheet data found.  awake, alert, oriented to person, place and time  recent and remote memory intact  normal attention and concentration  language fluent, comprehension intact, naming intact  fund of knowledge appropriate  CRANIAL NERVE:   2nd - no papilledema on fundoscopic exam  2nd, 3rd, 4th, 6th - pupils equal and reactive to light, visual fields full to confrontation, extraocular muscles intact, no nystagmus  5th - facial sensation symmetric  7th - facial strength symmetric  8th - hearing intact  9th - palate elevates symmetrically, uvula midline  11th - shoulder shrug symmetric  12th - tongue protrusion midline  MOTOR:   normal bulk and tone, full strength in the BUE, BLE  SENSORY:   normal and symmetric to light touch, temperature, vibration  COORDINATION:   finger-nose-finger, fine finger movements normal  REFLEXES:   deep tendon reflexes --> 2+ in BUE; TRACE AT KNEES; ABSENT AT ANKLES  GAIT/STATION:   narrow based gait; able to walk on toes, heels; SLIGHT DIFF WITH TANDEM; romberg is negative     DIAGNOSTIC DATA (LABS, IMAGING, TESTING) - I reviewed patient records, labs, notes, testing and imaging myself where available.  Lab Results  Component Value Date   WBC 15.9 (H) 07/06/2019   HGB 13.4 07/06/2019   HCT 41.3 07/06/2019   MCV 98.8 07/06/2019   PLT 237 07/06/2019      Component Value Date/Time   NA 138 07/06/2019 1547   NA 138 12/24/2018 0000   K 4.0 07/06/2019 1547   CL  100 07/06/2019 1547   CO2 18 (L) 07/06/2019 1547   GLUCOSE 109 (H) 07/06/2019 1547   BUN 44 (H) 07/06/2019 1547  BUN 18 12/24/2018 0000   CREATININE 3.86 (H) 07/06/2019 1547   CREATININE 7.19 (H) 03/07/2018 1518   CALCIUM 9.9 07/06/2019 1547   CALCIUM 8.2 (L) 01/24/2017 0359   PROT 8.7 (H) 07/06/2019 1547   ALBUMIN 3.9 07/06/2019 1547   AST 18 07/06/2019 1547   ALT 13 07/06/2019 1547   ALKPHOS 65 07/06/2019 1547   BILITOT 1.1 07/06/2019 1547   GFRNONAA 16 (L) 07/06/2019 1547   GFRNONAA 7 (L) 05/16/2016 1054   GFRAA 18 (L) 07/06/2019 1547   GFRAA 8 (L) 05/16/2016 1054   Lab Results  Component Value Date   CHOL 185 07/18/2017   HDL 37 (L) 07/18/2017   LDLCALC 120 (H) 07/18/2017   LDLDIRECT 65 07/24/2006   TRIG 158 (H) 07/18/2017   CHOLHDL 5.0 (H) 07/18/2017   No results found for: HGBA1C Lab Results  Component Value Date   VITAMINB12 553 01/02/2009   Lab Results  Component Value Date   TSH 0.581 01/24/2017     09/24/2018 MRI lumbar spine [I reviewed images myself and agree with interpretation. -VRP]  1. Acute to subacute bilateral sacral alar fractures, stable from prior CT. 2. Probable additional acute to subacute nondisplaced fractures of the bilateral transverse processes of L5, better seen on prior CT. 3. Mild diffuse soft tissue edema about the lower lumbar spine and sacrum, likely posttraumatic/reactive in nature. No discrete hematoma or other collection. 4. Small central disc protrusion at L5-S1, closely approximating the right greater than left descending S1 nerve roots without frank impingement. 5. Central disc protrusion at L4-5 with resultant mild bilateral lateral recess stenosis. No frank impingement. 6. Findings consistent with acute colitis about the visualized descending colon, better seen on prior CT.    ASSESSMENT AND PLAN  65 y.o. year old male here with HIV, lupus nephritis, end-stage renal disease, renal transplant, with gait difficulty  and numbness in feet since 2015, likely related to neuropathy.   Dx:  1. Gait difficulty   2. Neuropathy      PLAN:  GAIT DIFF / NEUROPATHY (likely related to kidney disease; since 2015; stable) - continue supportive care; consider PT evaluation for gait and strength training (patient will think about it and let us know)  Return for return to PCP.    Penni Bombard, MD [id number], [id number] AM Certified in Neurology, Neurophysiology and Neuroimaging  [facility] [address], [place], [place] [address] [phone]

## 2020-11-03 ENCOUNTER — Other Ambulatory Visit: Payer: Self-pay

## 2020-11-03 ENCOUNTER — Emergency Department (HOSPITAL_COMMUNITY)
Admission: EM | Admit: 2020-11-03 | Discharge: 2020-11-04 | Disposition: A | Discharge status: Home / Self Care | Payer: BC Managed Care – PPO | Attending: Emergency Medicine | Admitting: Emergency Medicine

## 2020-11-03 ENCOUNTER — Encounter (HOSPITAL_COMMUNITY): Payer: Self-pay | Admitting: Emergency Medicine

## 2020-11-03 ENCOUNTER — Emergency Department (HOSPITAL_COMMUNITY): Payer: BC Managed Care – PPO

## 2020-11-03 DIAGNOSIS — I120 Hypertensive chronic kidney disease with stage 5 chronic kidney disease or end stage renal disease: Secondary | ICD-10-CM | POA: Insufficient documentation

## 2020-11-03 DIAGNOSIS — Z7901 Long term (current) use of anticoagulants: Secondary | ICD-10-CM | POA: Diagnosis not present

## 2020-11-03 DIAGNOSIS — K529 Noninfective gastroenteritis and colitis, unspecified: Secondary | ICD-10-CM | POA: Insufficient documentation

## 2020-11-03 DIAGNOSIS — N186 End stage renal disease: Secondary | ICD-10-CM | POA: Diagnosis not present

## 2020-11-03 DIAGNOSIS — Z940 Kidney transplant status: Secondary | ICD-10-CM | POA: Insufficient documentation

## 2020-11-03 DIAGNOSIS — R109 Unspecified abdominal pain: Secondary | ICD-10-CM | POA: Diagnosis present

## 2020-11-03 DIAGNOSIS — Z7982 Long term (current) use of aspirin: Secondary | ICD-10-CM | POA: Insufficient documentation

## 2020-11-03 DIAGNOSIS — Z21 Asymptomatic human immunodeficiency virus [HIV] infection status: Secondary | ICD-10-CM | POA: Insufficient documentation

## 2020-11-03 DIAGNOSIS — K219 Gastro-esophageal reflux disease without esophagitis: Secondary | ICD-10-CM | POA: Insufficient documentation

## 2020-11-03 LAB — CBC WITH DIFFERENTIAL/PLATELET
Abs Immature Granulocytes: 0.07 10*3/uL (ref 0.00–0.07)
Basophils Absolute: 0 10*3/uL (ref 0.0–0.1)
Basophils Relative: 0 %
Eosinophils Absolute: 0.1 10*3/uL (ref 0.0–0.5)
Eosinophils Relative: 0 %
HCT: 43 % (ref 39.0–52.0)
Hemoglobin: 13.6 g/dL (ref 13.0–17.0)
Immature Granulocytes: 1 %
Lymphocytes Relative: 5 %
Lymphs Abs: 0.6 10*3/uL — ABNORMAL LOW (ref 0.7–4.0)
MCH: 30.4 pg (ref 26.0–34.0)
MCHC: 31.6 g/dL (ref 30.0–36.0)
MCV: 96.2 fL (ref 80.0–100.0)
Monocytes Absolute: 0.9 10*3/uL (ref 0.1–1.0)
Monocytes Relative: 7 %
Neutro Abs: 12.4 10*3/uL — ABNORMAL HIGH (ref 1.7–7.7)
Neutrophils Relative %: 87 %
Platelets: 218 10*3/uL (ref 150–400)
RBC: 4.47 MIL/uL (ref 4.22–5.81)
RDW: 15.3 % (ref 11.5–15.5)
WBC: 14.1 10*3/uL — ABNORMAL HIGH (ref 4.0–10.5)
nRBC: 0 % (ref 0.0–0.2)

## 2020-11-03 LAB — LACTIC ACID, PLASMA: Lactic Acid, Venous: 1.7 mmol/L (ref 0.5–1.9)

## 2020-11-03 LAB — COMPREHENSIVE METABOLIC PANEL
ALT: 7 U/L (ref 0–44)
AST: 15 U/L (ref 15–41)
Albumin: 3.5 g/dL (ref 3.5–5.0)
Alkaline Phosphatase: 76 U/L (ref 38–126)
Anion gap: 10 (ref 5–15)
BUN: 32 mg/dL — ABNORMAL HIGH (ref 8–23)
CO2: 21 mmol/L — ABNORMAL LOW (ref 22–32)
Calcium: 8.7 mg/dL — ABNORMAL LOW (ref 8.9–10.3)
Chloride: 106 mmol/L (ref 98–111)
Creatinine, Ser: 3.61 mg/dL — ABNORMAL HIGH (ref 0.61–1.24)
GFR, Estimated: 18 mL/min — ABNORMAL LOW (ref >60)
Glucose, Bld: 136 mg/dL — ABNORMAL HIGH (ref 70–99)
Potassium: 4.1 mmol/L (ref 3.5–5.1)
Sodium: 137 mmol/L (ref 135–145)
Total Bilirubin: 0.7 mg/dL (ref 0.3–1.2)
Total Protein: 8.5 g/dL — ABNORMAL HIGH (ref 6.5–8.1)

## 2020-11-03 LAB — LIPASE, BLOOD: Lipase: 24 U/L (ref 11–51)

## 2020-11-03 MED ORDER — SODIUM CHLORIDE 0.9 % IV BOLUS
1000.0000 mL | Freq: Once | INTRAVENOUS | Status: AC
  Administered 2020-11-03: 1000 mL via INTRAVENOUS

## 2020-11-03 MED ORDER — ONDANSETRON HCL 4 MG/2ML IJ SOLN: 4.0000 mg | Freq: Once | INTRAMUSCULAR | Status: DC

## 2020-11-03 MED ORDER — VANCOMYCIN HCL 125 MG PO CAPS: 125.0000 mg | ORAL_CAPSULE | Freq: Four times a day (QID) | ORAL | 0 refills | Status: AC

## 2020-11-03 MED ORDER — VANCOMYCIN HCL 125 MG PO CAPS
125.0000 mg | ORAL_CAPSULE | Freq: Once | ORAL | Status: AC
  Administered 2020-11-03: 125 mg via ORAL
  Filled 2020-11-03: qty 1

## 2020-11-03 NOTE — ED Notes (Signed)
The pt has had diarrhea  For 4-5 days  The pt was seen by his doctor and sent here to be treated for c diff  Hx of the same he also does not have an appetite

## 2020-11-03 NOTE — ED Triage Notes (Signed)
Pt sent here by PCP for watery diarrhea x4 days. Pt has hx of c-diff in 2020, states this feels the same. Pt endorses decrease in appetite and feels dehydrated, has had about 6 episodes of diarrhea per day.

## 2020-11-03 NOTE — ED Notes (Signed)
The pt returned from c-t  Med given

## 2020-11-03 NOTE — ED Provider Notes (Signed)
[facility] EMERGENCY DEPARTMENT Provider Note   CSN: [id number] Arrival date & time: 11/03/20  1706     History Chief Complaint  Patient presents with   Diarrhea    Kenneth [name] is a 65 y.o. male hx of DVT, ESRD s/p kidney transplant not on dialysis anymore, lupus, here presenting with diarrhea and abdominal pain.  Patient states that he has C. difficile back in 2020.  Patient states that for the last several days he has been having 5-6 episodes of watery diarrhea a day.  He felt some nausea and vomiting and abdominal cramps as well.  States that he was hospitalized for C. difficile at [facility] and finished a course of p.o. vancomycin.  Patient states that he called his doctor and was sent in for further evaluation  The history is provided by the patient.      Past Medical History:  Diagnosis Date   Achalasia    Candida infection, esophageal ([place]) 08/2010   a. 2012 - noted on EGD.   DVT (deep venous thrombosis) ([place]) ~ 04/2014   ?RLE   Dysphagia 2008   post heller myotomy/toupee fundoplication.     ESRD (end stage renal disease) on dialysis [facility])    "MWF: [name]" (01/22/2017)   Fall 01/22/2017   mechanical fall w/multiple fractures/notes 01/22/2017   Gait instability    GERD (gastroesophageal reflux disease)    hx (01/22/2017)   Hemorrhoids, internal    History of blood transfusion 03/2014   "low counts"   History of gout    History of stomach ulcers    HIV infection ([place]) dx ~ 2009   a. 02/13/2014 CD4 = 240 - undetectable viral load.   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   Adult failure to thrive  12/04/2018   Greater trochanter fracture ([place]) 10/23/2018   Severe sepsis ([place]) 09/29/2018   Clostridium enterocolitis 09/29/2018   ATN (acute tubular necrosis) ([place]) 09/29/2018   Ileus ([place]) 09/29/2018   Leucopenia    Current chronic use of systemic steroids 09/24/2018   History of esophageal ulcer 09/21/2018   History of renal transplant 09/19/2018   Pelvic fracture ([place]) 08/25/2018   Multiple falls 08/25/2018   Thumb fracture 08/25/2018   Closed nondisplaced zone I fracture of sacrum ([place]) 08/22/2018   Tachycardia 08/22/2018   Suprapubic pain 04/11/2018   Osteoporosis 03/15/2017   Chronic fatigue 03/11/2015   SLE (systemic lupus erythematosus) ([place]) 02/24/2015   AKI (acute kidney injury) ([place])    Dysphagia    ESRD (end stage renal disease) ([place])    DVT (deep venous thrombosis) ([place]) 06/22/2014   PVC's (premature ventricular contractions) 03/17/2014   Abdominal pain    Protein-calorie malnutrition, severe ([place]) 02/11/2014   Lupus nephritis (HCC)    Arthralgia of multiple sites, bilateral 02/02/2014   Bilateral low back pain without sciatica 02/01/2014   EXTERNAL HEMORRHOIDS WITHOUT MENTION COMP 01/20/2009   Macrocytic anemia 08/31/2006   HIV disease ([place]) 07/24/2006   Hyperlipidemia 05/31/2006   ERECTILE DYSFUNCTION 05/31/2006   INSOMNIA NOS 05/31/2006    Past Surgical History:  Procedure Laterality Date   BASCILIC VEIN TRANSPOSITION Left 06/29/2014   Procedure: FIRST STAGE  [place];  Surgeon: Rosetta Posner, MD;  Location: [place];  Service: Vascular;  Laterality: Left;   [place] Left 09/11/2014   Procedure: SECOND STAGE BASILIC VEIN TRANSPOSITION LEFT UPPER ARM;  Surgeon: Rosetta Posner, MD;  Location: [place];  Service: Vascular;  Laterality: Left;   BOWEL DECOMPRESSION N/A 09/29/2018   Procedure: BOWEL DECOMPRESSION;  Surgeon: Laurence Spates, MD;  Location: [place];  Service: Endoscopy;  Laterality: N/A;   COLONOSCOPY  2008   .   tortuous colon, internal hemorrhoids.  no polyps or diverticulosis.    ESOPHAGOGASTRODUODENOSCOPY  08/2010   Dr [name].  08/2010 candida esophagitis, no obvious esophageal stricture. 02/2006 and 05/2006 dilated esophagus, narrowed distal esophagus but no stricture, was empirically [place] dilated   ESOPHAGOGASTRODUODENOSCOPY N/A 06/27/2014   Procedure: ESOPHAGOGASTRODUODENOSCOPY (EGD);  Surgeon: [name], MD;  [facility] ENDOSCOPY;  Service: Endoscopy;  Laterality: N/A;   ESOPHAGOGASTRODUODENOSCOPY N/A 07/02/2014   Procedure: ESOPHAGOGASTRODUODENOSCOPY (EGD);  Surgeon: [name], MD;  Location: [facility] ENDOSCOPY;  Service: Endoscopy;  Laterality: N/A;  with botox   FLEXIBLE SIGMOIDOSCOPY N/A 09/29/2018   Procedure: FLEXIBLE SIGMOIDOSCOPY;  Surgeon: Laurence Spates, MD;  Location: [place];  Service: Endoscopy;  Laterality: N/A;   [place]   with toupee fundoplication of HH.    HERNIA REPAIR     INSERTION OF DIALYSIS CATHETER Right 06/29/2014   Procedure: INSERTION OF DIALYSIS CATHETER RIGHT IJ;  Surgeon: Rosetta Posner, MD;  Location: [place];  Service: Vascular;  Laterality: Right;   KIDNEY TRANSPLANT     January 2020 @ [place] Right ~ 2011   ORIF TIBIA PLATEAU Right 01/23/2017   Procedure: OPEN REDUCTION INTERNAL FIXATION (ORIF) TIBIAL PLATEAU; REPAIR OF LATERAL TIBIAL PLATEAU; ARTHROTOMY WITH MENISCUS REPAIR; ANTERIOR COMPARTMENT FASCIOTOMY;  Surgeon: Altamese Parowan, MD;  Location: [place];  Service: Orthopedics;  Laterality: Right;       Family History  Problem Relation Age of Onset   Colon cancer Father        deceased   Other Mother        s/p pacemaker - alive and well.   Hypertension Mother    Other Other        5 brothers, 3 sisters - alive and well.   Ovarian cancer Sister    Prostate cancer Brother     Social History   Tobacco Use   Smoking status: Never   Smokeless tobacco: Never  Vaping Use   Vaping Use: Never used  Substance Use Topics   Alcohol  use: Never   Drug use: Never    Home Medications Prior to Admission medications   Medication Sig Start Date End Date Taking? Authorizing Provider  abacavir-dolutegravir-lamiVUDine (TRIUMEQ) 600-50-300 MG tablet Take 1 tablet by mouth daily. Patient taking differently: Take 1 tablet by mouth daily with supper. 01/02/19   Medina-Vargas, Monina C, NP  acetaminophen (TYLENOL) 325 MG tablet Take 650 mg by mouth every 6 (six) hours as needed for headache (pain). Patient not taking: Reported on 08/11/2020    [provider]  amoxicillin (AMOXIL) 500 MG capsule Take 2,000 mg by mouth See admin instructions. Take four capsules (2000 mg) by mouth one hour prior to dental appointments Patient not taking: Reported on 08/11/2020 02/07/19   [provider]  aspirin EC 81 MG tablet Take 81 mg by mouth daily.  [provider]  Cholecalciferol (VITAMIN D3) 125 MCG (5000 UT) CAPS Take 5,000 Units by mouth daily.     [provider]  [employer] VITAMIN B12 1000 MCG tablet Take 1,000 mcg by mouth daily. 05/09/19   [provider]  enoxaparin (LOVENOX) 40 MG/0.4ML injection Inject 40 mg into the skin daily at 12 noon. Patient not taking: Reported on 08/11/2020 07/01/19   [provider]  folic acid (FOLVITE) 1 MG tablet Take 1 mg by mouth daily. 04/13/19   [provider]  hydroxychloroquine (PLAQUENIL) 200 MG tablet Take 200 mg by mouth 2 (two) times daily. 04/27/19   [provider]  pantoprazole (PROTONIX) 40 MG tablet Take 1 tablet (40 mg total) by mouth 2 (two) times daily. Patient taking differently: Take 40 mg by mouth daily. 01/01/19   Medina-Vargas, Monina C, NP  predniSONE (DELTASONE) 5 MG tablet Take 5 mg by mouth daily. 07/04/19   [provider]  tacrolimus (PROGRAF) 0.5 MG capsule Take 1 mg by mouth 2 (two) times daily. 06/03/19   [provider]  temazepam (RESTORIL) 7.5 MG capsule Take 7.5 mg by mouth at bedtime as needed.  08/10/20   [provider]    Allergies    Nsaids  Review of Systems   Review of Systems  Gastrointestinal:  Positive for abdominal pain and diarrhea.  All other systems reviewed and are negative.  Physical Exam Updated Vital Signs BP (!) 157/90   Pulse 91   Temp 98.2 F (36.8 C) (Oral)   Resp (!) 22   Ht '5\' 11"'$  (1.803 m)   Wt 61.2 kg   SpO2 100%   BMI 18.83 kg/m   Physical Exam Vitals and nursing note reviewed.  Constitutional:      Comments: Chronically ill and slightly dehydrated  HENT:     Head: Normocephalic.     Nose: Nose normal.     Mouth/Throat:     Mouth: Mucous membranes are dry.  Eyes:     Extraocular Movements: Extraocular movements intact.     Pupils: Pupils are equal, round, and reactive to light.  Cardiovascular:     Rate and Rhythm: Normal rate and regular rhythm.     Pulses: Normal pulses.     Heart sounds: Normal heart sounds.  Pulmonary:     Effort: Pulmonary effort is normal.     Breath sounds: Normal breath sounds.  Abdominal:     Comments: Complicated surgical scar.  Mild periumbilical tenderness  Musculoskeletal:        General: Normal range of motion.     Cervical back: Normal range of motion and neck supple.  Skin:    General: Skin is warm.     Capillary Refill: Capillary refill takes less than 2 seconds.  Neurological:     General: No focal deficit present.     Mental Status: He is oriented to person, place, and time.  Psychiatric:        Mood and Affect: Mood normal.        Behavior: Behavior normal.    ED Results / Procedures / Treatments   Labs (all labs ordered are listed, but only abnormal results are displayed) Labs Reviewed  CBC WITH DIFFERENTIAL/PLATELET - Abnormal; Notable for the following components:      Result Value   WBC 14.1 (*)    Neutro Abs 12.4 (*)    Lymphs Abs 0.6 (*)    All other components within normal limits  COMPREHENSIVE METABOLIC PANEL -  Abnormal; Notable for the following components:    CO2 21 (*)    Glucose, Bld 136 (*)    BUN 32 (*)    Creatinine, Ser 3.61 (*)    Calcium 8.7 (*)    Total Protein 8.5 (*)    GFR, Estimated 18 (*)    All other components within normal limits  C DIFFICILE QUICK SCREEN W PCR REFLEX    GASTROINTESTINAL PANEL BY PCR, STOOL (REPLACES STOOL CULTURE)  LIPASE, BLOOD  LACTIC ACID, PLASMA    EKG None  Radiology CT ABDOMEN PELVIS WO CONTRAST  Result Date: 11/03/2020 CLINICAL DATA:  Distension renal failure diarrhea EXAM: CT ABDOMEN AND PELVIS WITHOUT CONTRAST TECHNIQUE: Multidetector CT imaging of the abdomen and pelvis was performed following the standard protocol without IV contrast. COMPARISON:  CT 09/26/2018 FINDINGS: Lower chest: Lung bases demonstrate no acute consolidation or effusion. Hepatobiliary: Gallstones. No biliary dilatation or focal hepatic abnormality. Pancreas: Unremarkable. No pancreatic ductal dilatation or surrounding inflammatory changes. Spleen: Normal in size without focal abnormality. Adrenals/Urinary Tract: Adrenal glands are grossly unremarkable. Right kidney is not identified. Atrophic native left kidney. Right lower quadrant transplant kidney without hydronephrosis. Stomach/Bowel: Numerous clips near the GE junction. No dilated small bowel. Fluid within the colon consistent with history of diarrhea. Postsurgical changes at the rectosigmoid colon. Suspected wall thickening of the rectosigmoid colon. Vascular/Lymphatic: Nonaneurysmal aorta with atherosclerosis. No suspicious nodes Reproductive: Prostate is unremarkable. Other: Negative for free air or free fluid. Musculoskeletal: Chronic fracture deformities of the sacrum and left inferior pubic ramus. IMPRESSION: 1. Fluid within the colon consistent with history of diarrhea. Suspected colon wall thickening at the rectosigmoid colon consistent with distal colitis 2. Gallstones 3. Right lower quadrant transplanted kidney. Atrophic native left kidney with nonvisualized native right  kidney Electronically Signed   By: Donavan Foil M.D.   On: 11/03/2020 21:48    Procedures Procedures   Angiocath insertion Performed by: Wandra Arthurs  Consent: Verbal consent obtained. Risks and benefits: risks, benefits and alternatives were discussed Time out: Immediately prior to procedure a "time out" was called to verify the correct patient, procedure, equipment, support staff and site/side marked as required.  Preparation: Patient was prepped and draped in the usual sterile fashion.  Vein Location: R antecube   Ultrasound Guided  Gauge: 20 long   Normal blood return and flush without difficulty Patient tolerance: Patient tolerated the procedure well with no immediate complications.    Medications Ordered in ED Medications  ondansetron (ZOFRAN) injection 4 mg (4 mg Intravenous Not Given 11/03/20 2224)  sodium chloride 0.9 % bolus 1,000 mL (1,000 mLs Intravenous New Bag/Given 11/03/20 2223)  vancomycin (VANCOCIN) capsule 125 mg (125 mg Oral Given 11/03/20 2157)    ED Course  I have reviewed the triage vital signs and the nursing notes.  Pertinent labs & imaging results that were available during my care of the patient were reviewed by me and considered in my medical decision making (see chart for details).    MDM Rules/Calculators/A&P                          Kenneth [name] is a 65 y.o. male here presenting with diarrhea. Hx of C diff, no recent abx use. Will get cbc, cmp, ct ab/pel. Will hydrate and give vanc empirically. C diff ordered.   10:57 PM WBC 14. Cr is 3.6. CT showed distal colitis. Unable to stool sample. Will give vancomycin PO empirically for  10 days.  If he needs longer course, he can call transplant doctor.    Final Clinical Impression(s) / ED Diagnoses Final diagnoses:  None    Rx / DC Orders ED Discharge Orders     None        Drenda Freeze, MD 11/03/20 2300

## 2020-11-03 NOTE — ED Notes (Signed)
The pt returned from c-t 

## 2020-11-03 NOTE — Discharge Instructions (Addendum)
You have colitis on CT and likely from C diff   Take vancomycin four times daily for 10 days.   Call your transplant doctor tomorrow about follow up   Return to ER if you have worse diarrhea, vomiting, fever, abdominal pain

## 2020-11-03 NOTE — ED Provider Notes (Signed)
Emergency Medicine Provider Triage Evaluation Note  Kenneth [name] , a 65 y.o. male  was evaluated in triage.  Pt complains of diarrhea and weakness.  Patient states that the past 4 days he has had watery diarrhea.  No blood in his stool.  Reports decreased appetite and generalized weakness.  History of kidney transplant on immunosuppression.  History of HIV.  History of C. difficile in 2020, states this feels similar.  No fever, nausea, vomiting, abdominal pain.  Review of Systems  Positive: Diarrhea, weakness Negative: fever  Physical Exam  BP 134/84 (BP Location: Right Arm)   Pulse (!) 101   Temp 99.1 F (37.3 C) (Oral)   Resp 18   Ht '5\' 11"'$  (1.803 m)   Wt 61.2 kg   SpO2 100%   BMI 18.83 kg/m  Gen:   Awake, no distress   Resp:  Normal effort  MSK:   Moves extremities without difficulty  Other:  No ttp of abd  Medical Decision Making  Medically screening exam initiated at 5:48 PM.  Appropriate orders placed.  [name] was informed that the remainder of the evaluation will be completed by another provider, this initial triage assessment does not replace that evaluation, and the importance of remaining in the ED until their evaluation is complete.  Labs, ct   [place], [place], PA-C 11/03/20 1749    Daleen Bo, MD 11/03/20 [phone]

## 2020-11-04 NOTE — ED Notes (Signed)
No diarrhea stool while he was here in the ed

## 2020-11-22 ENCOUNTER — Encounter (HOSPITAL_COMMUNITY): Payer: Self-pay

## 2020-11-22 ENCOUNTER — Other Ambulatory Visit: Payer: Self-pay

## 2020-11-22 ENCOUNTER — Emergency Department (HOSPITAL_COMMUNITY)
Admission: EM | Admit: 2020-11-22 | Discharge: 2020-11-22 | Disposition: A | Discharge status: Home / Self Care | Payer: BC Managed Care – PPO | Attending: Emergency Medicine | Admitting: Emergency Medicine

## 2020-11-22 ENCOUNTER — Emergency Department (HOSPITAL_BASED_OUTPATIENT_CLINIC_OR_DEPARTMENT_OTHER): Payer: BC Managed Care – PPO

## 2020-11-22 DIAGNOSIS — Z79899 Other long term (current) drug therapy: Secondary | ICD-10-CM | POA: Diagnosis not present

## 2020-11-22 DIAGNOSIS — N186 End stage renal disease: Secondary | ICD-10-CM | POA: Diagnosis not present

## 2020-11-22 DIAGNOSIS — M79652 Pain in left thigh: Secondary | ICD-10-CM | POA: Diagnosis present

## 2020-11-22 DIAGNOSIS — Z7982 Long term (current) use of aspirin: Secondary | ICD-10-CM | POA: Diagnosis not present

## 2020-11-22 DIAGNOSIS — Z992 Dependence on renal dialysis: Secondary | ICD-10-CM | POA: Diagnosis not present

## 2020-11-22 DIAGNOSIS — B20 Human immunodeficiency virus [HIV] disease: Secondary | ICD-10-CM | POA: Insufficient documentation

## 2020-11-22 DIAGNOSIS — M545 Low back pain, unspecified: Secondary | ICD-10-CM | POA: Insufficient documentation

## 2020-11-22 DIAGNOSIS — M79605 Pain in left leg: Secondary | ICD-10-CM | POA: Diagnosis not present

## 2020-11-22 DIAGNOSIS — I120 Hypertensive chronic kidney disease with stage 5 chronic kidney disease or end stage renal disease: Secondary | ICD-10-CM | POA: Insufficient documentation

## 2020-11-22 MED ORDER — ACETAMINOPHEN 500 MG PO TABS: 500.0000 mg | ORAL_TABLET | Freq: Four times a day (QID) | ORAL | Status: DC | PRN

## 2020-11-22 MED ORDER — ACETAMINOPHEN 500 MG PO TABS: 1000.0000 mg | ORAL_TABLET | Freq: Once | ORAL | Status: DC

## 2020-11-22 MED ORDER — ACETAMINOPHEN 500 MG PO TABS
1000.0000 mg | ORAL_TABLET | Freq: Once | ORAL | Status: AC
  Administered 2020-11-22: 1000 mg via ORAL
  Filled 2020-11-22: qty 2

## 2020-11-22 MED ORDER — TIZANIDINE HCL 2 MG PO CAPS: 2.0000 mg | ORAL_CAPSULE | Freq: Three times a day (TID) | ORAL | 0 refills | Status: DC

## 2020-11-22 NOTE — ED Provider Notes (Signed)
[facility] EMERGENCY DEPARTMENT Provider Note   CSN: [id number] Arrival date & time: 11/22/20  [id number]     History No chief complaint on file.   Kenneth [name] is a 65 y.o. male.  Presents with progressively worsening left thigh pain over the past several days. He reports that the pain feels like a throbbing in his thigh 5-6/10 as rest and 9/10 with movement and weightbearing. Denies pain in lower leg pallor, pulseless, paraesthesias. No increase in leg circumference, no edema Denies any night sweats, no fever, or chills.   HPI     Past Medical History:  Diagnosis Date   Achalasia    Candida infection, esophageal ([place]) 08/2010   a. 2012 - noted on EGD.   DVT (deep venous thrombosis) ([place]) ~ 04/2014   ?RLE   Dysphagia 2008   post heller myotomy/toupee fundoplication.     ESRD (end stage renal disease) on dialysis [facility])    "MWF: [name]" (01/22/2017)   Fall 01/22/2017   mechanical fall w/multiple fractures/notes 01/22/2017   Gait instability    GERD (gastroesophageal reflux disease)    hx (01/22/2017)   Hemorrhoids, internal    History of blood transfusion 03/2014   "low counts"   History of gout    History of stomach ulcers    HIV infection ([place]) dx ~ 2009   a. 02/13/2014 CD4 = 240 - undetectable viral load.   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   Adult failure to thrive 12/04/2018   Greater trochanter fracture ([place]) 10/23/2018   Severe sepsis ([place]) 09/29/2018   Clostridium enterocolitis 09/29/2018   ATN (acute tubular necrosis) ([place]) 09/29/2018   Ileus ([place]) 09/29/2018   Leucopenia    Current  chronic use of systemic steroids 09/24/2018   History of esophageal ulcer 09/21/2018   History of renal transplant 09/19/2018   Pelvic fracture ([place]) 08/25/2018   Multiple falls 08/25/2018   Thumb fracture 08/25/2018   Closed nondisplaced zone I fracture of sacrum ([place]) 08/22/2018   Tachycardia 08/22/2018   Suprapubic pain 04/11/2018   Osteoporosis 03/15/2017   Chronic fatigue 03/11/2015   SLE (systemic lupus erythematosus) ([place]) 02/24/2015   AKI (acute kidney injury) ([place])    Dysphagia    ESRD (end stage renal disease) ([place])    DVT (deep venous thrombosis) ([place]) 06/22/2014   PVC's (premature ventricular contractions) 03/17/2014   Abdominal pain    Protein-calorie malnutrition, severe ([place]) 02/11/2014   Lupus nephritis (HCC)    Arthralgia of multiple sites, bilateral 02/02/2014   Bilateral low back pain without sciatica 02/01/2014   EXTERNAL HEMORRHOIDS WITHOUT MENTION COMP 01/20/2009   Macrocytic anemia 08/31/2006   HIV disease ([place]) 07/24/2006   Hyperlipidemia 05/31/2006   ERECTILE DYSFUNCTION 05/31/2006   INSOMNIA NOS 05/31/2006    Past Surgical History:  Procedure Laterality Date   BASCILIC VEIN TRANSPOSITION Left 06/29/2014   Procedure: FIRST STAGE  [place];  Surgeon: Rosetta Posner, MD;  Location: [facility];  Service: Vascular;  Laterality: Left;   [facility] Left 09/11/2014   Procedure: SECOND STAGE BASILIC VEIN  TRANSPOSITION LEFT UPPER ARM;  Surgeon: Rosetta Posner, MD;  Location: [facility];  Service: Vascular;  Laterality: Left;   BOWEL DECOMPRESSION N/A 09/29/2018   Procedure: BOWEL DECOMPRESSION;  Surgeon: Laurence Spates, MD;  Location: [facility];  Service: Endoscopy;  Laterality: N/A;   COLONOSCOPY  2008   .  tortuous colon, internal hemorrhoids.  no polyps or diverticulosis.    ESOPHAGOGASTRODUODENOSCOPY  08/2010   Dr [name].  08/2010 candida esophagitis, no obvious esophageal stricture. 02/2006 and 05/2006 dilated esophagus, narrowed  distal esophagus but no stricture, was empirically [place] dilated   ESOPHAGOGASTRODUODENOSCOPY N/A 06/27/2014   Procedure: ESOPHAGOGASTRODUODENOSCOPY (EGD);  Surgeon: [name], MD;  Location[facility] ENDOSCOPY;  Service: Endoscopy;  Laterality: N/A;   ESOPHAGOGASTRODUODENOSCOPY N/A 07/02/2014   Procedure: ESOPHAGOGASTRODUODENOSCOPY (EGD);  Surgeon: [name], MD;  Location: [facility] ENDOSCOPY;  Service: Endoscopy;  Laterality: N/A;  with botox   FLEXIBLE SIGMOIDOSCOPY N/A 09/29/2018   Procedure: FLEXIBLE SIGMOIDOSCOPY;  Surgeon: Laurence Spates, MD;  Location: [facility];  Service: Endoscopy;  Laterality: N/A;   [place]   with toupee fundoplication of HH.    HERNIA REPAIR     INSERTION OF DIALYSIS CATHETER Right 06/29/2014   Procedure: INSERTION OF DIALYSIS CATHETER RIGHT IJ;  Surgeon: Rosetta Posner, MD;  Location: [facility];  Service: Vascular;  Laterality: Right;   KIDNEY TRANSPLANT     January 2020 @ [place] Right ~ 2011   ORIF TIBIA PLATEAU Right 01/23/2017   Procedure: OPEN REDUCTION INTERNAL FIXATION (ORIF) TIBIAL PLATEAU; REPAIR OF LATERAL TIBIAL PLATEAU; ARTHROTOMY WITH MENISCUS REPAIR; ANTERIOR COMPARTMENT FASCIOTOMY;  Surgeon: Altamese Midway, MD;  Location: [place];  Service: Orthopedics;  Laterality: Right;       Family History  Problem Relation Age of Onset   Colon cancer Father        deceased   Other Mother        s/p pacemaker - alive and well.   Hypertension Mother    Other Other        5 brothers, 3 sisters - alive and well.   Ovarian cancer Sister    Prostate cancer Brother     Social History   Tobacco Use   Smoking status: Never   Smokeless tobacco: Never  Vaping Use   Vaping Use: Never used  Substance Use Topics   Alcohol use: Never   Drug use: Never    Home Medications Prior to Admission medications   Medication Sig Start Date End Date Taking? Authorizing Provider  abacavir-dolutegravir-lamiVUDine (TRIUMEQ) 600-50-300 MG tablet Take 1 tablet  by mouth daily. Patient taking differently: Take 1 tablet by mouth daily with supper. 01/02/19  Yes Medina-Vargas, Monina C, NP  acetaminophen (TYLENOL) 325 MG tablet Take 650 mg by mouth every 6 (six) hours as needed for headache.   Yes [provider]  aspirin EC 81 MG tablet Take 81 mg by mouth daily.   Yes [provider]  denosumab (PROLIA) 60 MG/ML SOSY injection Inject 60 mg into the skin every 6 (six) months.   Yes [provider]  folic acid (FOLVITE) 1 MG tablet Take 1 mg by mouth daily. 04/13/19  Yes [provider]  hydroxychloroquine (PLAQUENIL) 200 MG tablet Take 200 mg by mouth daily. 04/27/19  Yes [provider]  pantoprazole (PROTONIX) 40 MG tablet Take 1 tablet (40 mg total) by mouth 2 (two) times daily. Patient taking differently: Take 40 mg by mouth daily. 01/01/19  Yes Medina-Vargas, Monina  C, NP  predniSONE (DELTASONE) 5 MG tablet Take 5 mg by mouth daily. 07/04/19  Yes [provider]  temazepam (RESTORIL) 7.5 MG capsule Take 7.5 mg by mouth at bedtime as needed for sleep. 08/10/20  Yes [provider]  tizanidine (ZANAFLEX) 2 MG capsule Take 1 capsule (2 mg total) by mouth 3 (three) times daily. 11/22/20  Yes Carmin Muskrat, MD  enoxaparin (LOVENOX) 40 MG/0.4ML injection Inject 40 mg into the skin daily at 12 noon. Patient not taking: No sig reported 07/01/19   [provider]  tacrolimus (PROGRAF) 1 MG capsule Take 1 mg by mouth 2 (two) times daily. 05/04/20 05/04/21  [provider]    Allergies    Nsaids  Review of Systems   Review of Systems  Constitutional: Negative.   HENT: Negative.    Eyes: Negative.   Respiratory: Negative.    Cardiovascular: Negative.   Gastrointestinal: Negative.   Endocrine: Negative.   Genitourinary: Negative.   Musculoskeletal:  Positive for myalgias. Negative for arthralgias, back pain, joint swelling, neck pain and neck stiffness.  Skin: Negative.    Neurological: Negative.   Psychiatric/Behavioral: Negative.     Physical Exam Updated Vital Signs BP 136/73 (BP Location: Right Arm)   Pulse 87   Temp 98.8 F (37.1 C) (Oral)   Resp 18   SpO2 100%   Physical Exam Constitutional:      Appearance: Normal appearance.  HENT:     Head: Normocephalic and atraumatic.  Eyes:     Extraocular Movements: Extraocular movements intact.     Pupils: Pupils are equal, round, and reactive to light.  Cardiovascular:     Rate and Rhythm: Normal rate and regular rhythm.     Pulses: Normal pulses.     Heart sounds: Normal heart sounds.  Pulmonary:     Effort: Pulmonary effort is normal.     Breath sounds: Normal breath sounds.  Abdominal:     General: Abdomen is flat. Bowel sounds are normal.     Palpations: Abdomen is soft.  Musculoskeletal:        General: No swelling, tenderness, deformity or signs of injury. Normal range of motion.     Cervical back: Normal range of motion.     Right lower leg: No edema.     Left lower leg: No edema.  Skin:    General: Skin is warm and dry.     Capillary Refill: Capillary refill takes less than 2 seconds.     Coloration: Skin is not pale.     Findings: No bruising, erythema, lesion or rash.  Neurological:     General: No focal deficit present.     Mental Status: He is alert and oriented to person, place, and time. Mental status is at baseline.  Psychiatric:        Mood and Affect: Mood normal.        Behavior: Behavior normal.    ED Results / Procedures / Treatments   Labs (all labs ordered are listed, but only abnormal results are displayed) Labs Reviewed - No data to display  EKG None  Radiology CT EXTREMITY LOWER LEFT WO CONTRAST  Result Date: 11/23/2020 CLINICAL DATA:  Soft tissue infection suspected, thigh, no prior imaging. Thigh muscle spasms EXAM: CT OF THE LOWER LEFT EXTREMITY WITHOUT CONTRAST TECHNIQUE: Multidetector CT imaging of the lower left extremity was performed according  to the standard protocol. COMPARISON:  None. FINDINGS: Bones/Joint/Cartilage Normal alignment. No fracture or dislocation. No lytic or  blastic bone lesion. No cortical erosions or abnormal periosteal reaction. Mild left hip degenerative arthritis. Mild medial compartment degenerative arthritis of the knee. Ligaments Suboptimally assessed by CT. Muscles and Tendons Normal muscular bulk.  Quadriceps and patellar tendons are intact. Soft tissues No abnormal subcutaneous soft tissue infiltration, subcutaneous gas, or loculated fluid collections identified within the left thigh. No effusion within the left knee. IMPRESSION: No evidence of soft tissue infection involving the left thigh. Normal muscle bulk. No acute abnormality. Electronically Signed   By: Fidela Salisbury M.D.   On: 11/23/2020 21:24   VAS [place] LOWER EXTREMITY VENOUS (DVT) (ONLY [facility] & [facility])  Result Date: 11/22/2020  Lower Venous DVT Study Patient Name:  Kenneth [name]  Date of Exam:   11/22/2020 Medical Rec #: [id number]         Accession #:    [id number] Date of Birth: [birth year]          Patient Gender: M Patient Age:   39 years Exam Location:  [facility] Procedure:      VAS [place] LOWER EXTREMITY VENOUS (DVT) Referring Phys: DAN FLOYD --------------------------------------------------------------------------------  Indications: Pain.  Comparison Study: 01/28/17 prior Performing Technologist: Archie Patten RVS  Examination Guidelines: A complete evaluation includes B-mode imaging, spectral Doppler, color Doppler, and power Doppler as needed of all accessible portions of each vessel. Bilateral testing is considered an integral part of a complete examination. Limited examinations for reoccurring indications may be performed as noted. The reflux portion of the exam is performed with the patient in reverse Trendelenburg.  +-----+---------------+---------+-----------+----------+--------------+ RIGHTCompressibilityPhasicitySpontaneityPropertiesThrombus  Aging +-----+---------------+---------+-----------+----------+--------------+ CFV  Full           Yes      Yes                                 +-----+---------------+---------+-----------+----------+--------------+   +---------+---------------+---------+-----------+----------+--------------+ LEFT     CompressibilityPhasicitySpontaneityPropertiesThrombus Aging +---------+---------------+---------+-----------+----------+--------------+ CFV      Full           Yes      Yes                                 +---------+---------------+---------+-----------+----------+--------------+ SFJ      Full                                                        +---------+---------------+---------+-----------+----------+--------------+ FV Prox  Full                                                        +---------+---------------+---------+-----------+----------+--------------+ FV Mid   Full                                                        +---------+---------------+---------+-----------+----------+--------------+ FV DistalFull                                                        +---------+---------------+---------+-----------+----------+--------------+  PFV      Full                                                        +---------+---------------+---------+-----------+----------+--------------+ POP      Full           Yes      Yes                                 +---------+---------------+---------+-----------+----------+--------------+ PTV      Full                                                        +---------+---------------+---------+-----------+----------+--------------+ PERO     Full                                                        +---------+---------------+---------+-----------+----------+--------------+     Summary: RIGHT: - No evidence of common femoral vein obstruction.  LEFT: - There is no evidence of deep vein thrombosis in  the lower extremity.  - No cystic structure found in the popliteal fossa.  *See table(s) above for measurements and observations.    Preliminary     Procedures Procedures   Medications Ordered in ED Medications  acetaminophen (TYLENOL) tablet 1,000 mg (1,000 mg Oral Given 11/22/20 1511)    ED Course  I have reviewed the triage vital signs and the nursing notes.  Pertinent labs & imaging results that were available during my care of the patient were reviewed by me and considered in my medical decision making (see chart for details).    MDM Rules/Calculators/A&P                          Patient is a 65 year old male with a history of HIV, bilateral low back pain without sciatica, and hx of multiple fractures from ground level falls who presents to the emergency department for evaluation of left thigh pain. He reports that he has a throbbing pain in his left thigh, worsened with movement, improved with rest. States that at rest, pain is 2-3, 7-8 when he walks or puts weight on it. He has tried ibuprofen which has not fully improved pain. No recent falls or trauma to region.  On exam, no edema or erythema of the thigh were noted. There was no edema or erythema of distal extremity. Pulses were intact bilaterally, no pain or pallor distally, no numbness, tingling, or weakness. ROM was full, straight leg raise was negative was well. No signs of infection. DVT study was performed to r/o possible thrombosis and returned negative. Patients leg pain was felt to be muscular in origin. He was given scheduled tylenol and prescription for flexeril. Told to return to the ED if pain worsened or did not improved. He will need to follow up with his PCP.   Final Clinical Impression(s) / ED Diagnoses Final diagnoses:  Left leg pain    Rx / DC Orders ED Discharge Orders          Ordered    tizanidine (ZANAFLEX) 2 MG capsule  3 times daily        11/22/20 1703             Delene Ruffini,  MD 11/23/20 [place], [place], DO 11/25/20 [phone]

## 2020-11-22 NOTE — Discharge Instructions (Addendum)
As discussed, your evaluation today has been largely reassuring.  But, it is important that you monitor your condition carefully, and do not hesitate to return to the ED if you develop new, or concerning changes in your condition.  Please use Tylenol , 650 mg, 3 times daily, and Zanaflex (muscle relaxer) for symptom control.

## 2020-11-22 NOTE — Progress Notes (Signed)
Lower extremity venous has been completed.   Preliminary results in CV Proc.   Abram Sander 11/22/2020 3:37 PM

## 2020-11-22 NOTE — ED Provider Notes (Signed)
5:04 PM Patient awake, alert, in no distress, sitting upright, speaking clearly.  He is aware of negative DVT study.  Advised on symptom control, primary care team follow-up.   Carmin Muskrat, MD 11/22/20 [phone]

## 2020-11-22 NOTE — ED Triage Notes (Signed)
Patient complains of left thigh pain x 3 days, denies trauma but pain increased with ambulation. NAD

## 2020-11-23 ENCOUNTER — Encounter (HOSPITAL_COMMUNITY): Payer: Self-pay

## 2020-11-23 ENCOUNTER — Emergency Department (HOSPITAL_COMMUNITY)
Admission: EM | Admit: 2020-11-23 | Discharge: 2020-11-24 | Disposition: A | Discharge status: Home / Self Care | Payer: BC Managed Care – PPO | Attending: Emergency Medicine | Admitting: Emergency Medicine

## 2020-11-23 ENCOUNTER — Other Ambulatory Visit: Payer: Self-pay

## 2020-11-23 ENCOUNTER — Emergency Department (HOSPITAL_COMMUNITY): Payer: BC Managed Care – PPO

## 2020-11-23 DIAGNOSIS — M791 Myalgia, unspecified site: Secondary | ICD-10-CM

## 2020-11-23 DIAGNOSIS — Z21 Asymptomatic human immunodeficiency virus [HIV] infection status: Secondary | ICD-10-CM | POA: Diagnosis not present

## 2020-11-23 DIAGNOSIS — Z992 Dependence on renal dialysis: Secondary | ICD-10-CM | POA: Diagnosis not present

## 2020-11-23 DIAGNOSIS — I120 Hypertensive chronic kidney disease with stage 5 chronic kidney disease or end stage renal disease: Secondary | ICD-10-CM | POA: Diagnosis not present

## 2020-11-23 DIAGNOSIS — Z79899 Other long term (current) drug therapy: Secondary | ICD-10-CM | POA: Diagnosis not present

## 2020-11-23 DIAGNOSIS — Z7982 Long term (current) use of aspirin: Secondary | ICD-10-CM | POA: Insufficient documentation

## 2020-11-23 DIAGNOSIS — N186 End stage renal disease: Secondary | ICD-10-CM | POA: Insufficient documentation

## 2020-11-23 DIAGNOSIS — M79652 Pain in left thigh: Secondary | ICD-10-CM | POA: Insufficient documentation

## 2020-11-23 DIAGNOSIS — Z940 Kidney transplant status: Secondary | ICD-10-CM | POA: Insufficient documentation

## 2020-11-23 LAB — COMPREHENSIVE METABOLIC PANEL
ALT: 10 U/L (ref 0–44)
AST: 14 U/L — ABNORMAL LOW (ref 15–41)
Albumin: 3.9 g/dL (ref 3.5–5.0)
Alkaline Phosphatase: 61 U/L (ref 38–126)
Anion gap: 13 (ref 5–15)
BUN: 29 mg/dL — ABNORMAL HIGH (ref 8–23)
CO2: 24 mmol/L (ref 22–32)
Calcium: 9.3 mg/dL (ref 8.9–10.3)
Chloride: 102 mmol/L (ref 98–111)
Creatinine, Ser: 2.94 mg/dL — ABNORMAL HIGH (ref 0.61–1.24)
GFR, Estimated: 23 mL/min — ABNORMAL LOW (ref >60)
Glucose, Bld: 69 mg/dL — ABNORMAL LOW (ref 70–99)
Potassium: 5.4 mmol/L — ABNORMAL HIGH (ref 3.5–5.1)
Sodium: 139 mmol/L (ref 135–145)
Total Bilirubin: 0.8 mg/dL (ref 0.3–1.2)
Total Protein: 8.4 g/dL — ABNORMAL HIGH (ref 6.5–8.1)

## 2020-11-23 LAB — CBC
HCT: 39.5 % (ref 39.0–52.0)
Hemoglobin: 12.4 g/dL — ABNORMAL LOW (ref 13.0–17.0)
MCH: 30.3 pg (ref 26.0–34.0)
MCHC: 31.4 g/dL (ref 30.0–36.0)
MCV: 96.6 fL (ref 80.0–100.0)
Platelets: 220 10*3/uL (ref 150–400)
RBC: 4.09 MIL/uL — ABNORMAL LOW (ref 4.22–5.81)
RDW: 15.9 % — ABNORMAL HIGH (ref 11.5–15.5)
WBC: 5.3 10*3/uL (ref 4.0–10.5)
nRBC: 0 % (ref 0.0–0.2)

## 2020-11-23 LAB — CK: Total CK: 146 U/L (ref 49–397)

## 2020-11-23 LAB — CBG MONITORING, ED: Glucose-Capillary: 74 mg/dL (ref 70–99)

## 2020-11-23 MED ORDER — SODIUM CHLORIDE 0.9 % IV BOLUS
500.0000 mL | Freq: Once | INTRAVENOUS | Status: AC
Start: 2020-11-23 — End: 2020-11-23
  Administered 2020-11-23: 500 mL via INTRAVENOUS

## 2020-11-23 MED ORDER — ACETAMINOPHEN 325 MG PO TABS
650.0000 mg | ORAL_TABLET | Freq: Once | ORAL | Status: AC
  Administered 2020-11-23: 650 mg via ORAL
  Filled 2020-11-23: qty 2

## 2020-11-23 MED ORDER — OXYCODONE-ACETAMINOPHEN 5-325 MG PO TABS
1.0000 | ORAL_TABLET | Freq: Once | ORAL | Status: AC
  Administered 2020-11-23: 1 via ORAL
  Filled 2020-11-23: qty 1

## 2020-11-23 NOTE — ED Notes (Signed)
Gave pt some more juice and graham crackers

## 2020-11-23 NOTE — ED Notes (Signed)
Call placed for patient transfer to [facility] per Kathrynn Humble MD orders .  Contact person [place] transferred this call to [facility] MD

## 2020-11-23 NOTE — Discharge Instructions (Addendum)
  You were evaluated in the Emergency Department and after careful evaluation, we did not find any emergent condition requiring admission or further testing in the hospital.   Your exam/testing today was overall reassuring.  Please follow-up with your rheumatology as we discussed, please call them as a as he can in the morning.  You should also be receiving a call from the [facility] transplant team.  I also want you to follow-up with your primary care to get your potassium rechecked in the next couple of days.  Continue to take Tylenol as directed on the bottle for pain. Please return to the Emergency Department if you experience any worsening of your condition.  Thank you for allowing [place] to be a part of your care. Please speak to your pharmacist about any new medications prescribed today in regards to side effects or interactions with other medications.

## 2020-11-23 NOTE — ED Triage Notes (Signed)
Per EMS- patient c/o right thigh muscle spasms x  2-3 days. Patient reports that he went to [facility] ED yesterday and was discharged with a Rx for muscle relaxer. Patient states he took one last night with no relief.

## 2020-11-23 NOTE — ED Provider Notes (Signed)
[facility] DEPT Provider Note   CSN: [id number] Arrival date & time: 11/23/20  [id number]     History No chief complaint on file.   Kenneth [name] is a 65 y.o. male with past medical history of DVT, HIV, kidney transplant, hypertension, lupus that presents to the emergency department today for left thigh pain.  Patient was seen for similar yesterday and comes ED,.  At that time had a negative DVT study, was given muscle relaxants and discharged home.  Patient presents today for similar complaint, states that muscle relaxants did not help him.  Patient states that thigh pain started a couple days ago, only on his left thigh.  Denies any trauma to the area.  Patient states that he is never had anything like this before, no new medications.  Denies any radiation.  Patient states that the pain feels like a throbbing sensation.Patient is pain-free when he lays down, however starts having pain every time he moves or walks.  Patient states that the pain is a 7 out of 10 when it occurs.  Denies any pain elsewhere.  Denies any fevers, nausea, vomiting, abdominal pain, groin pain.  Denies any penile swelling or discharge.  Denies any dysuria or hematuria.  Has no other complaints at this time. Per chart review last CD4 count was 494.   HPI     Past Medical History:  Diagnosis Date   Achalasia    Candida infection, esophageal ([place]) 08/2010   a. 2012 - noted on EGD.   DVT (deep venous thrombosis) ([place]) ~ 04/2014   ?RLE   Dysphagia 2008   post heller myotomy/toupee fundoplication.     ESRD (end stage renal disease) on dialysis [facility])    "MWF: [name]" (01/22/2017)   Fall 01/22/2017   mechanical fall w/multiple fractures/notes 01/22/2017   Gait instability    GERD (gastroesophageal reflux disease)    hx (01/22/2017)   Hemorrhoids, internal    History of blood transfusion 03/2014   "low counts"   History of gout    History of stomach ulcers    HIV infection  ([place]) dx ~ 2009   a. 02/13/2014 CD4 = 240 - undetectable viral load.   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/2018   Adult failure to thrive 12/04/2018   Greater trochanter fracture ([place]) 10/23/2018   Severe sepsis ([place]) 09/29/2018   Clostridium enterocolitis 09/29/2018   ATN (acute tubular necrosis) ([place]) 09/29/2018   Ileus ([place]) 09/29/2018   Leucopenia    Current chronic use of systemic steroids 09/24/2018   History of esophageal ulcer 09/21/2018   History of renal transplant 09/19/2018   Pelvic fracture ([place]) 08/25/2018   Multiple falls 08/25/2018   Thumb fracture 08/25/2018   Closed nondisplaced zone I fracture of sacrum ([place]) 08/22/2018   Tachycardia 08/22/2018   Suprapubic pain 04/11/2018   Osteoporosis 03/15/2017   Chronic fatigue 03/11/2015   SLE (systemic lupus erythematosus) ([place]) 02/24/2015   AKI (acute kidney injury) ([place])    Dysphagia    ESRD (end stage renal disease) ([place])    DVT (deep venous thrombosis) (  [place]) 06/22/2014   PVC's (premature ventricular contractions) 03/17/2014   Abdominal pain    Protein-calorie malnutrition, severe ([place]) 02/11/2014   Lupus nephritis (HCC)    Arthralgia of multiple sites, bilateral 02/02/2014   Bilateral low back pain without sciatica 02/01/2014   EXTERNAL HEMORRHOIDS WITHOUT MENTION COMP 01/20/2009   Macrocytic anemia 08/31/2006   HIV disease ([place]) 07/24/2006   Hyperlipidemia 05/31/2006   ERECTILE DYSFUNCTION 05/31/2006   INSOMNIA NOS 05/31/2006    Past Surgical History:  Procedure Laterality Date   BASCILIC VEIN TRANSPOSITION Left 06/29/2014   Procedure: FIRST STAGE  [place];  Surgeon: Rosetta Posner, MD;  Location: [facility];  Service: Vascular;  Laterality: Left;   [place] Left 09/11/2014   Procedure: SECOND STAGE BASILIC VEIN TRANSPOSITION LEFT UPPER ARM;  Surgeon: Rosetta Posner, MD;  Location: [facility];  Service: Vascular;  Laterality: Left;   BOWEL DECOMPRESSION N/A 09/29/2018   Procedure: BOWEL DECOMPRESSION;  Surgeon: Laurence Spates, MD;  Location: [facility];  Service: Endoscopy;  Laterality: N/A;   COLONOSCOPY  2008   .  tortuous colon, internal hemorrhoids.  no polyps or diverticulosis.    ESOPHAGOGASTRODUODENOSCOPY  08/2010   Dr [name].  08/2010 candida esophagitis, no obvious esophageal stricture. 02/2006 and 05/2006 dilated esophagus, narrowed distal esophagus but no stricture, was empirically [place] dilated   ESOPHAGOGASTRODUODENOSCOPY N/A 06/27/2014   Procedure: ESOPHAGOGASTRODUODENOSCOPY (EGD);  Surgeon: [name], MD;  Location: [facility] ENDOSCOPY;  Service: Endoscopy;  Laterality: N/A;   ESOPHAGOGASTRODUODENOSCOPY N/A 07/02/2014   Procedure: ESOPHAGOGASTRODUODENOSCOPY (EGD);  Surgeon: [name], MD;  Location: [facility] ENDOSCOPY;  Service: Endoscopy;  Laterality: N/A;  with botox   FLEXIBLE SIGMOIDOSCOPY N/A 09/29/2018   Procedure: FLEXIBLE SIGMOIDOSCOPY;  Surgeon: Laurence Spates, MD;  Location: [facility];  Service: Endoscopy;  Laterality: N/A;   [place]   with toupee fundoplication of HH.    HERNIA REPAIR     INSERTION OF DIALYSIS CATHETER Right 06/29/2014   Procedure: INSERTION OF DIALYSIS CATHETER RIGHT IJ;  Surgeon: Rosetta Posner, MD;  Location: [facility];  Service: Vascular;  Laterality: Right;   KIDNEY TRANSPLANT     January 2020 @ [facility] Right ~ 2011   ORIF TIBIA PLATEAU Right 01/23/2017   Procedure: OPEN REDUCTION INTERNAL FIXATION (ORIF) TIBIAL PLATEAU; REPAIR OF LATERAL TIBIAL PLATEAU; ARTHROTOMY WITH MENISCUS REPAIR; ANTERIOR COMPARTMENT FASCIOTOMY;  Surgeon: Altamese [place], MD;  Location: [facility];   Service: Orthopedics;  Laterality: Right;       Family History  Problem Relation Age of Onset   Colon cancer Father        deceased   Other Mother        s/p pacemaker - alive and well.   Hypertension Mother    Other Other        5 brothers, 3 sisters - alive and well.   Ovarian cancer Sister    Prostate cancer Brother     Social History   Tobacco Use   Smoking status: Never   Smokeless tobacco: Never  Vaping Use   Vaping Use: Never used  Substance Use Topics   Alcohol use: Never   Drug use: Never    Home Medications Prior to Admission medications   Medication Sig Start Date End Date Taking? Authorizing Provider  abacavir-dolutegravir-lamiVUDine (TRIUMEQ) 600-50-300 MG tablet Take 1 tablet by mouth daily. Patient taking differently: Take 1 tablet by mouth daily with supper. 01/02/19  Yes Medina-Vargas, Monina C, NP  acetaminophen (  TYLENOL) 325 MG tablet Take 650 mg by mouth every 6 (six) hours as needed for headache.   Yes [provider]  aspirin EC 81 MG tablet Take 81 mg by mouth daily.   Yes [provider]  denosumab (PROLIA) 60 MG/ML SOSY injection Inject 60 mg into the skin every 6 (six) months.   Yes [provider]  folic acid (FOLVITE) 1 MG tablet Take 1 mg by mouth daily. 04/13/19  Yes [provider]  hydroxychloroquine (PLAQUENIL) 200 MG tablet Take 200 mg by mouth daily. 04/27/19  Yes [provider]  pantoprazole (PROTONIX) 40 MG tablet Take 1 tablet (40 mg total) by mouth 2 (two) times daily. Patient taking differently: Take 40 mg by mouth daily. 01/01/19  Yes Medina-Vargas, Monina C, NP  predniSONE (DELTASONE) 5 MG tablet Take 5 mg by mouth daily. 07/04/19  Yes [provider]  tacrolimus (PROGRAF) 1 MG capsule Take 1 mg by mouth 2 (two) times daily. 05/04/20 05/04/21 Yes [provider]  temazepam (RESTORIL) 7.5 MG capsule Take 7.5 mg by mouth at bedtime as needed for sleep. 08/10/20  Yes [provider]  tizanidine (ZANAFLEX) 2 MG capsule Take 1 capsule (2 mg total) by mouth 3 (three) times daily. 11/22/20  Yes Carmin Muskrat, MD  enoxaparin (LOVENOX) 40 MG/0.4ML injection Inject 40 mg into the skin daily at 12 noon. Patient not taking: No sig reported 07/01/19   [provider]    Allergies    Nsaids  Review of Systems   Review of Systems  Constitutional:  Negative for chills, diaphoresis, fatigue and fever.  HENT:  Negative for congestion, sore throat and trouble swallowing.   Eyes:  Negative for pain and visual disturbance.  Respiratory:  Negative for cough, shortness of breath and wheezing.   Cardiovascular:  Negative for chest pain, palpitations and leg swelling.  Gastrointestinal:  Negative for abdominal distention, abdominal pain, diarrhea, nausea and vomiting.  Genitourinary:  Negative for difficulty urinating.  Musculoskeletal:  Positive for myalgias. Negative for arthralgias, back pain, neck pain and neck stiffness.  Skin:  Negative for pallor.  Neurological:  Negative for dizziness, speech difficulty, weakness and headaches.  Psychiatric/Behavioral:  Negative for confusion.    Physical Exam Updated Vital Signs BP (!) 156/88   Pulse 91   Temp 99.4 F (37.4 C) (Oral)   Resp (!) 26   Ht '5\' 11"'$  (1.803 m)   Wt 61.2 kg   SpO2 100%   BMI 18.83 kg/m   Physical Exam Constitutional:      General: He is not in acute distress.    Appearance: Normal appearance. He is not ill-appearing, toxic-appearing or diaphoretic.  HENT:     Mouth/Throat:     Mouth: Mucous membranes are moist.     Pharynx: Oropharynx is clear.  Eyes:     General: No scleral icterus.    Extraocular Movements: Extraocular movements intact.     Pupils: Pupils are equal, round, and reactive to light.  Cardiovascular:     Rate and Rhythm: Normal rate and regular rhythm.     Pulses: Normal pulses.     Heart sounds: Normal heart sounds.  Pulmonary:     Effort: Pulmonary effort  is normal. No respiratory distress.     Breath sounds: Normal breath sounds. No stridor. No wheezing, rhonchi or rales.  Chest:     Chest wall: No tenderness.  Abdominal:     General: Abdomen is flat. There is no distension.  Palpations: Abdomen is soft.     Tenderness: There is no abdominal tenderness. There is no guarding or rebound.  Genitourinary:      Comments: Normal GU exam,   Musculoskeletal:        General: No swelling or tenderness. Normal range of motion.     Cervical back: Normal range of motion and neck supple. No rigidity.     Right lower leg: No edema.     Left lower leg: No edema.     Comments: Patient with tenderness to left thigh, nontender to palpation, states that he has internal pain when he moves the area.  Area does not appear edematous, is not warm to touch, no erythema.  Is able to range hip normally with good range of motion to hip and knee.  Good strength to ankles, DP pulse 2+.  Good coloration throughout.  Skin:    General: Skin is warm and dry.     Capillary Refill: Capillary refill takes less than 2 seconds.     Coloration: Skin is not pale.  Neurological:     General: No focal deficit present.     Mental Status: He is alert and oriented to person, place, and time.  Psychiatric:        Mood and Affect: Mood normal.        Behavior: Behavior normal.    ED Results / Procedures / Treatments   Labs (all labs ordered are listed, but only abnormal results are displayed) Labs Reviewed  CBC - Abnormal; Notable for the following components:      Result Value   RBC 4.09 (*)    Hemoglobin 12.4 (*)    RDW 15.9 (*)    All other components within normal limits  COMPREHENSIVE METABOLIC PANEL - Abnormal; Notable for the following components:   Potassium 5.4 (*)    Glucose, Bld 69 (*)    BUN 29 (*)    Creatinine, Ser 2.94 (*)    Total Protein 8.4 (*)    AST 14 (*)    GFR, Estimated 23 (*)    All other components within normal limits  CK  CBG  MONITORING, ED    EKG None  Radiology CT EXTREMITY LOWER LEFT WO CONTRAST  Result Date: 11/23/2020 CLINICAL DATA:  Soft tissue infection suspected, thigh, no prior imaging. Thigh muscle spasms EXAM: CT OF THE LOWER LEFT EXTREMITY WITHOUT CONTRAST TECHNIQUE: Multidetector CT imaging of the lower left extremity was performed according to the standard protocol. COMPARISON:  None. FINDINGS: Bones/Joint/Cartilage Normal alignment. No fracture or dislocation. No lytic or blastic bone lesion. No cortical erosions or abnormal periosteal reaction. Mild left hip degenerative arthritis. Mild medial compartment degenerative arthritis of the knee. Ligaments Suboptimally assessed by CT. Muscles and Tendons Normal muscular bulk.  Quadriceps and patellar tendons are intact. Soft tissues No abnormal subcutaneous soft tissue infiltration, subcutaneous gas, or loculated fluid collections identified within the left thigh. No effusion within the left knee. IMPRESSION: No evidence of soft tissue infection involving the left thigh. Normal muscle bulk. No acute abnormality. Electronically Signed   By: Fidela Salisbury M.D.   On: 11/23/2020 21:24   VAS [place] LOWER EXTREMITY VENOUS (DVT) (ONLY [facility] & [facility])  Result Date: 11/22/2020  Lower Venous DVT Study Patient Name:  PARAM HURD  Date of Exam:   11/22/2020 Medical Rec #: [id number]         Accession #:    [id number] Date of Birth: 02-14-1956  Patient Gender: M Patient Age:   9 years Exam Location:  [facility] Procedure:      VAS [place] LOWER EXTREMITY VENOUS (DVT) Referring Phys: DAN FLOYD --------------------------------------------------------------------------------  Indications: Pain.  Comparison Study: 01/28/17 prior Performing Technologist: Archie Patten RVS  Examination Guidelines: A complete evaluation includes B-mode imaging, spectral Doppler, color Doppler, and power Doppler as needed of all accessible portions of each vessel. Bilateral testing is considered  an integral part of a complete examination. Limited examinations for reoccurring indications may be performed as noted. The reflux portion of the exam is performed with the patient in reverse Trendelenburg.  +-----+---------------+---------+-----------+----------+--------------+ RIGHTCompressibilityPhasicitySpontaneityPropertiesThrombus Aging +-----+---------------+---------+-----------+----------+--------------+ CFV  Full           Yes      Yes                                 +-----+---------------+---------+-----------+----------+--------------+   +---------+---------------+---------+-----------+----------+--------------+ LEFT     CompressibilityPhasicitySpontaneityPropertiesThrombus Aging +---------+---------------+---------+-----------+----------+--------------+ CFV      Full           Yes      Yes                                 +---------+---------------+---------+-----------+----------+--------------+ SFJ      Full                                                        +---------+---------------+---------+-----------+----------+--------------+ FV Prox  Full                                                        +---------+---------------+---------+-----------+----------+--------------+ FV Mid   Full                                                        +---------+---------------+---------+-----------+----------+--------------+ FV DistalFull                                                        +---------+---------------+---------+-----------+----------+--------------+ PFV      Full                                                        +---------+---------------+---------+-----------+----------+--------------+ POP      Full           Yes      Yes                                 +---------+---------------+---------+-----------+----------+--------------+ PTV  Full                                                         +---------+---------------+---------+-----------+----------+--------------+ PERO     Full                                                        +---------+---------------+---------+-----------+----------+--------------+     Summary: RIGHT: - No evidence of common femoral vein obstruction.  LEFT: - There is no evidence of deep vein thrombosis in the lower extremity.  - No cystic structure found in the popliteal fossa.  *See table(s) above for measurements and observations.    Preliminary     Procedures Procedures   Medications Ordered in ED Medications  sodium chloride 0.9 % bolus 500 mL (0 mLs Intravenous Stopped 11/23/20 1846)  acetaminophen (TYLENOL) tablet 650 mg (650 mg Oral Given 11/23/20 1648)  oxyCODONE-acetaminophen (PERCOCET/ROXICET) 5-325 MG per tablet 1 tablet (1 tablet Oral Given 11/23/20 2007)    ED Course  I have reviewed the triage vital signs and the nursing notes.  Pertinent labs & imaging results that were available during my care of the patient were reviewed by me and considered in my medical decision making (see chart for details).    MDM Rules/Calculators/A&P                           65 year old male who [facility] to the [facility] apartment today for muscle spasms, DVT study negative done yesterday.  Area does not appear infected, normal range of motion to the area.  Patient is distally neurovascularly intact.  At rest patient does not have any pain.  CBC without any major derangements, CMP with potassium of 5.4, fluids given for this.  Patient will have recheck in the next couple of days, creatinine 2.94 which is actually better than previous.  CK normal.  CT soft tissue without any abnormalities.  Think this is most likely medication side effect from rheumatology medications, will send to patient's rheumatologist for follow-up.  Dr. Kathrynn Humble spoke to the transplant team at [facility], they will follow-up with patient.  Upon reevaluation patient states he feels  somewhat better with Percocet, is able to ambulate.  Patient will take Tylenol at home for pain and call rheumatology in the morning, patient agreeable with plan.  Doubt need for further emergent work up at this time. I explained the diagnosis and have given explicit precautions to return to the ER including for any other new or worsening symptoms. The patient understands and accepts the medical plan as it's been dictated and I have answered their questions. Discharge instructions concerning home care and prescriptions have been given. The patient is STABLE and is discharged to home in good condition.  I discussed this case with my attending physician who cosigned this note including patient's presenting symptoms, physical exam, and planned diagnostics and interventions. Attending physician stated agreement with plan or made changes to plan which were implemented.   Attending physician assessed patient at bedside.  Final Clinical Impression(s) / ED Diagnoses Final diagnoses:  Myalgia    Rx / DC Orders ED Discharge  Orders     None        Alfredia Client, PA-C 11/23/20 2151    Varney Biles, MD 11/24/20 1759

## 2020-11-23 NOTE — ED Notes (Signed)
Attempted to ambulate patient. Pt unable to stand without assistance. This RN assisted patient to standing position where he became shaky and rated pain 10/10 in the left lower extremity. Patient unable to take a step forward. Pt returned to bed.

## 2021-04-24 IMAGING — CT CT OF THE LEFT HIP WITHOUT CONTRAST
2 of 3 series · 16 of 46 positions shown, 18 images · non-contrast
Comparison: Left hip x-rays from same day. CT abdomen pelvis dated
June 22, 2014.

CLINICAL DATA: Left hip pain after fall.

EXAM:
CT OF THE LEFT HIP WITHOUT CONTRAST
TECHNIQUE: Multidetector CT imaging of the left hip was performed according to
the standard protocol. Multiplanar CT image reconstructions were
also generated.

[Series 5: soft tissue · axial · 0.43mm/px · z∈[+711,+929]mm · 13 of 127 slices shown, 15 images]
[im 9/127  soft-tissue]
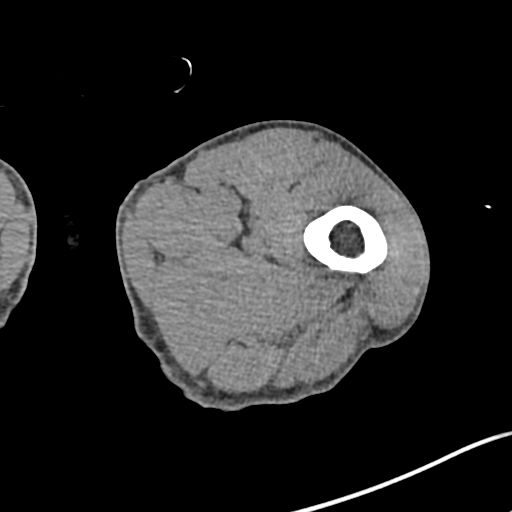
[im 9/127  bone]
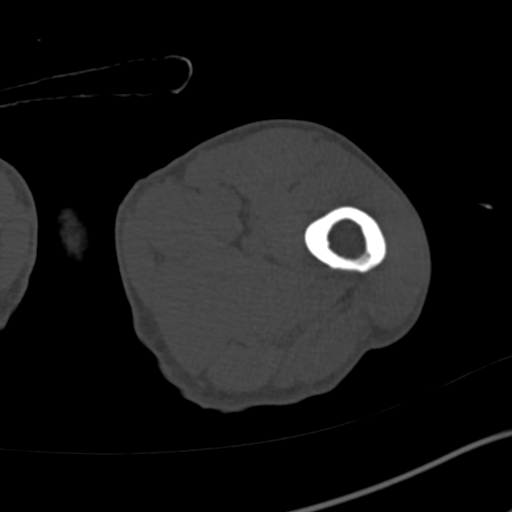
[im 17/127  soft-tissue]
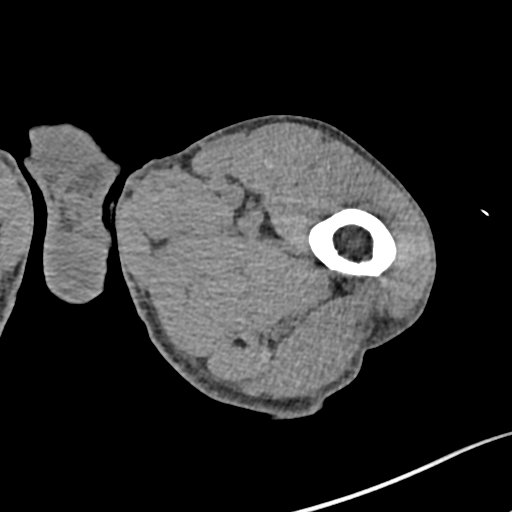
[im 25/127  soft-tissue]
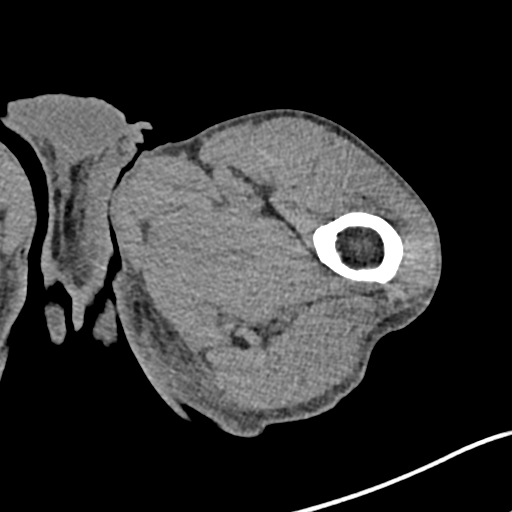
[im 37/127  soft-tissue]
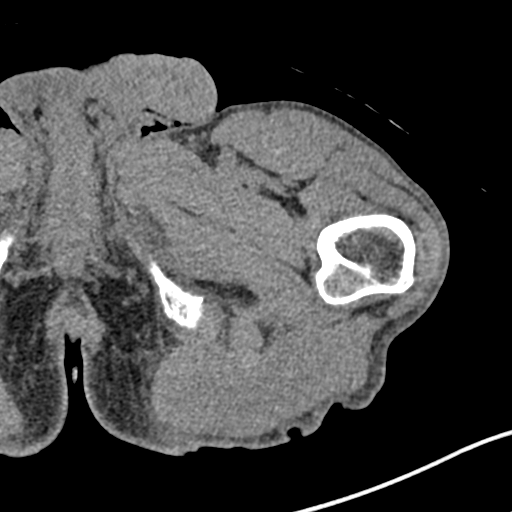
[im 45/127  soft-tissue]
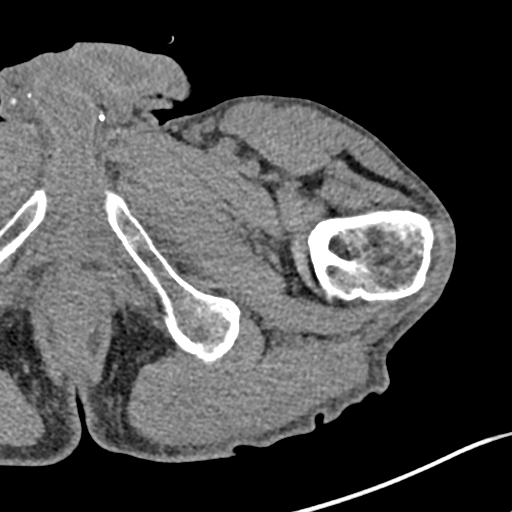
[im 53/127  soft-tissue]
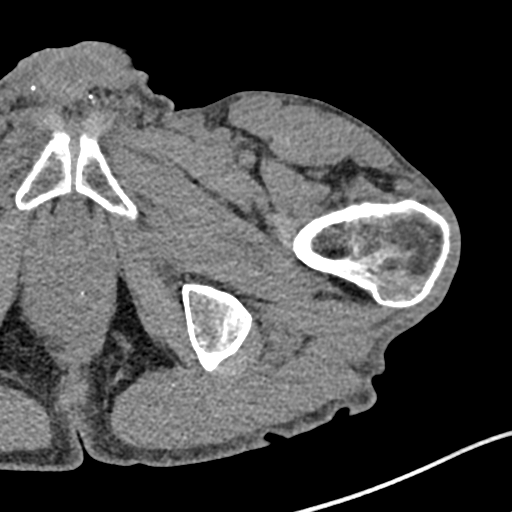
[im 66/127  soft-tissue]
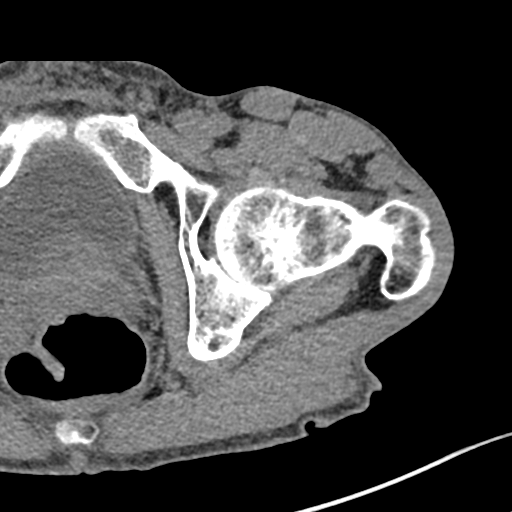
[im 74/127  soft-tissue]
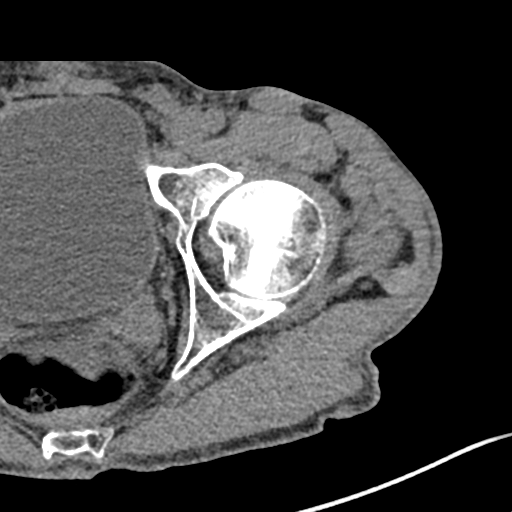
[im 82/127  soft-tissue]
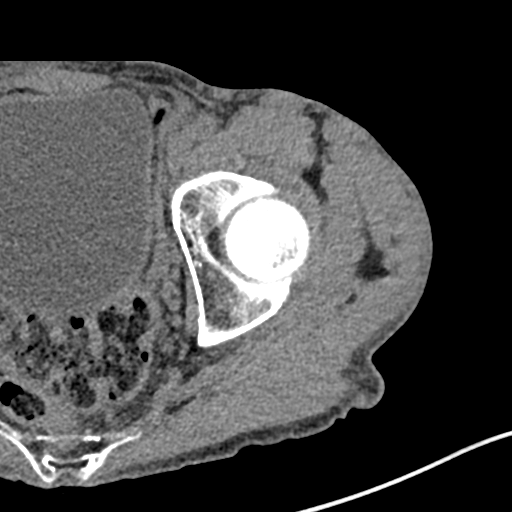
[im 82/127  bone]
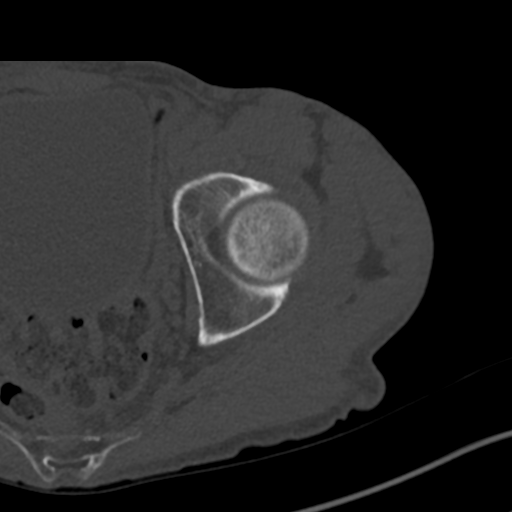
[im 90/127  soft-tissue]
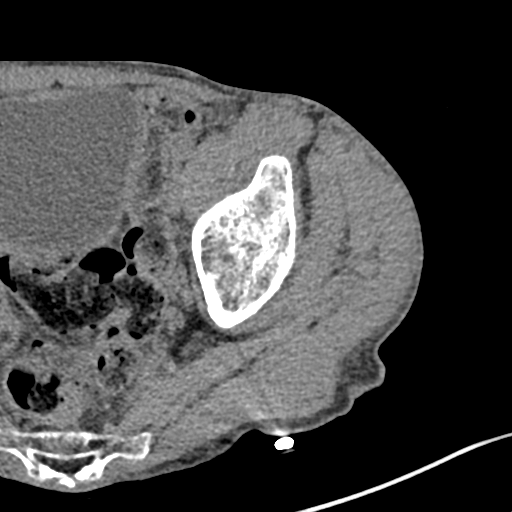
[im 102/127  soft-tissue]
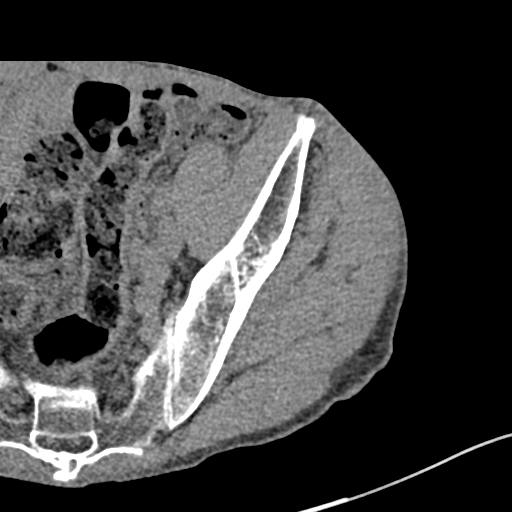
[im 110/127  soft-tissue]
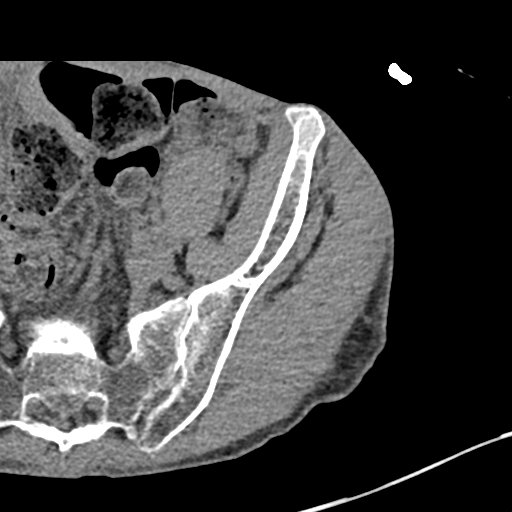
[im 118/127  soft-tissue]
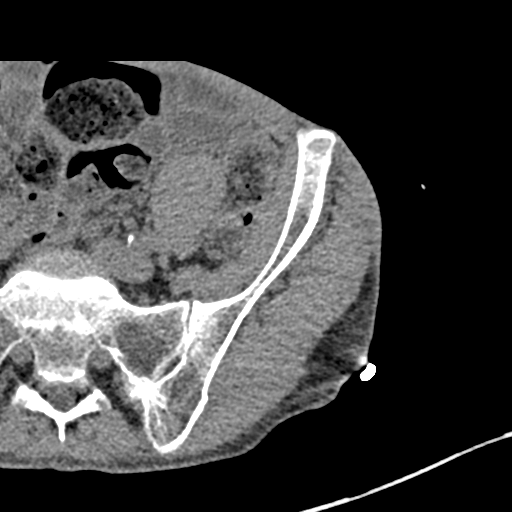

[Series 6: cor soft · coronal · 0.37mm/px · 3 of 113 slices shown]
[im 38/113  soft-tissue]
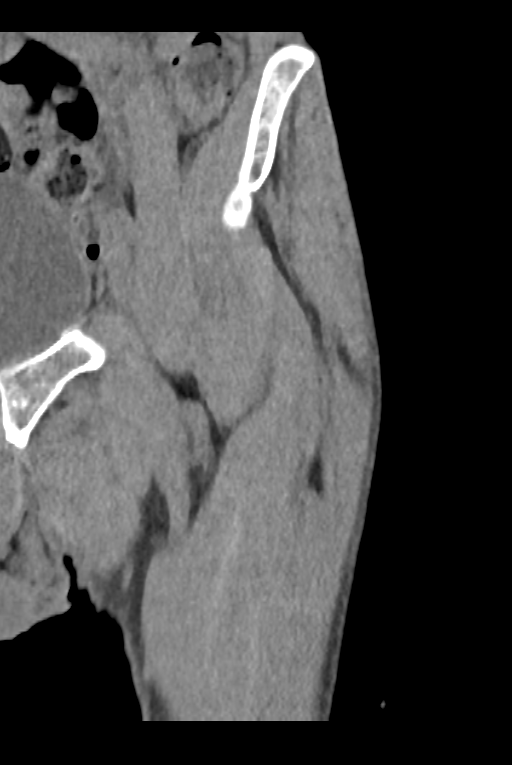
[im 50/113  soft-tissue]
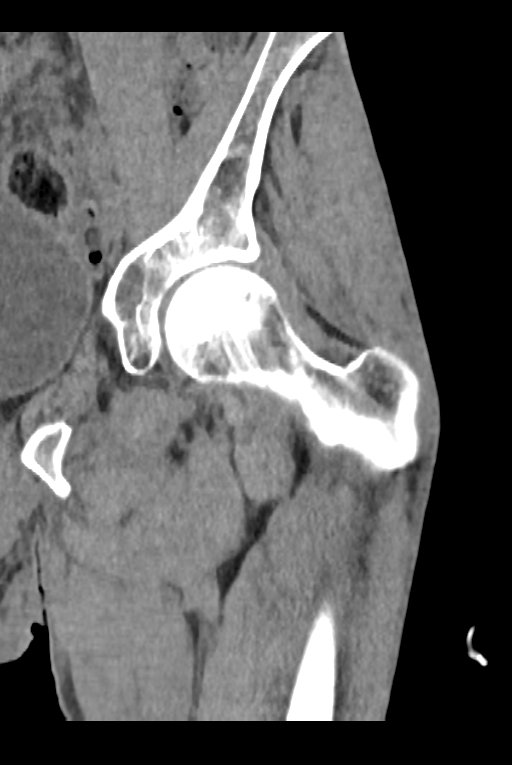
[im 63/113  soft-tissue]
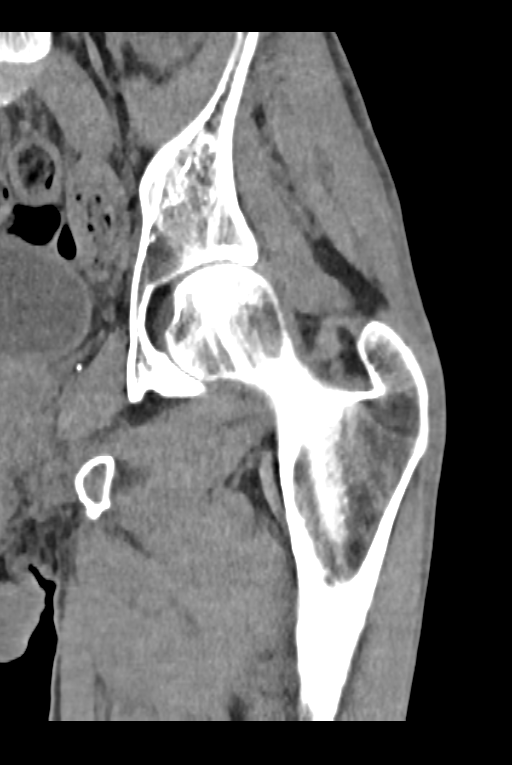

[16 of 46 positions shown; findings below may reference images not displayed]

FINDINGS: Bones/Joint/Cartilage

There is a tiny nondisplaced fracture of the anterior left sacral
ala (series 3, image 19). No additional fracture. No dislocation.
The left hip joint space is preserved. No joint effusion.

Ligaments

Suboptimally assessed by CT.

Muscles and Tendons

Grossly intact.

Soft tissues

No soft tissue mass or fluid collection.
IMPRESSION: 1. Tiny nondisplaced fracture of the anterior left sacral ala.
2. No femur fracture.

## 2021-08-26 ENCOUNTER — Ambulatory Visit: Payer: BC Managed Care – PPO | Admitting: Internal Medicine

## 2021-09-12 ENCOUNTER — Ambulatory Visit: Payer: BC Managed Care – PPO | Admitting: Internal Medicine

## 2021-11-29 NOTE — Progress Notes (Unsigned)
Office Visit Note  Patient: Kenneth [name]             Date of Birth: 1956-02-26           MRN: [id number]             PCP: Malena Peer, MD Referring: Malena Peer, MD Visit Date: 11/30/2021 Occupation: [employer]  Subjective:  New Patient (Initial Visit) (Just started back on HCQ. Left knee pain getting a little better but still bothersome. Numbness in feet. )   History of Present Illness: Kenneth [name] is a 66 y.o. male here to establish care for systemic lupus.  Medical history is also significant for well-controlled HIV. 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  He saw Dr. Dossie Der for local rheumatology provider previously.  Initial symptoms included joint pain and swelling in multiple areas as well as severe fatigue and the kidney inflammation.  He was previously treated with hydroxychloroquine and oral prednisone for lupus though extrarenal disease activity did not appear to include other organ threatening involvement and was on hemodialysis shortly after diagnosis.  Joint pains have been pretty well controlled recently his worst complaint is in the left leg raise had stiffness and soreness ongoing in the past few months.  Pain in the thigh and knee lasting several minutes each morning but she describes some clicking or cracking sensation.  He was restarted on the hydroxychloroquine suspicious of redevelopment for lupus related arthralgias symptoms are a little bit better today but has not been on the medication very long.  He does have numbness affecting the toes of his feet there is no radiation of pain down the leg no extension of numbness past the forefoot.  He had a complicated series of medical events in 2020 he underwent renal transplant immunosuppression originally CellCept then Prograf and prednisone which is his [name] regimen.  Had complication from multiple  falls resulting in a large fracture of the sacrum and fracture of the greater trochanter.  Fragility fractures attributed to chronic renal disease and long-term use of glucocorticoids.  He also developed severe infection complicated by C. difficile requiring partial colectomy and temporarily had an ostomy for managing this.  He was put on treatment with Prolia for about the past 3 years most recent dose approximately 3 months ago.  He denies any history of Raynaud's symptoms no oral or nasal ulcers no alopecia is no photosensitive skin rashes.  Activities of Daily Living:  Patient reports morning stiffness for 0 minutes.   Patient Reports nocturnal pain.  Difficulty dressing/grooming: Denies Difficulty climbing stairs: Reports Difficulty getting out of chair: Denies Difficulty using hands for taps, buttons, cutlery, and/or writing: Denies  Review of Systems  Constitutional:  Negative for fatigue.  HENT:  Negative for mouth sores and mouth dryness.   Eyes:  Negative for dryness.  Respiratory:  Negative for shortness of breath.   Cardiovascular:  Negative for chest pain and palpitations.  Gastrointestinal:  Negative for blood in stool, constipation and diarrhea.  Endocrine: Negative for increased urination.  Genitourinary:  Negative for involuntary urination.  Musculoskeletal:  Positive for joint pain, gait problem and joint pain. Negative for joint swelling, myalgias, muscle weakness, morning stiffness, muscle tenderness and myalgias.  Skin:  Negative for color change, rash, hair loss and sensitivity to sunlight.  Allergic/Immunologic: Negative for susceptible to infections.  Neurological:  Negative for dizziness and  headaches.  Hematological:  Negative for swollen glands.  Psychiatric/Behavioral:  Positive for sleep disturbance. Negative for depressed mood. The patient is not nervous/anxious.     PMFS History:  Patient Active Problem List   Diagnosis Date Noted   Pain in left leg  11/30/2021   Vitamin D deficiency 11/30/2021   History of creation of ostomy ([place]) 01/22/2019   Volvulus of sigmoid colon S/P colostomy(HCC) 12/31/2018   Adult failure to thrive 12/04/2018   Greater trochanter fracture ([place]) 10/23/2018   Severe sepsis ([place]) 09/29/2018   Clostridium enterocolitis 09/29/2018   ATN (acute tubular necrosis) ([place]) 09/29/2018   Ileus ([place]) 09/29/2018   Leucopenia    [name] chronic use of systemic steroids 09/24/2018   History of esophageal ulcer 09/21/2018   History of renal transplant 09/19/2018   Pelvic fracture ([place]) 08/25/2018   Multiple falls 08/25/2018   Thumb fracture 08/25/2018   Closed nondisplaced zone I fracture of sacrum ([place]) 08/22/2018   Tachycardia 08/22/2018   Suprapubic pain 04/11/2018   Osteoporosis 03/15/2017   Chronic fatigue 03/11/2015   SLE (systemic lupus erythematosus) ([place]) 02/24/2015   AKI (acute kidney injury) ([place])    Dysphagia    ESRD (end stage renal disease) ([place])    DVT (deep venous thrombosis) ([place]) 06/22/2014   PVC's (premature ventricular contractions) 03/17/2014   Abdominal pain    Protein-calorie malnutrition, severe ([place]) 02/11/2014   Lupus nephritis (HCC)    Arthralgia of multiple sites, bilateral 02/02/2014   Bilateral low back pain without sciatica 02/01/2014   EXTERNAL HEMORRHOIDS WITHOUT MENTION COMP 01/20/2009   Macrocytic anemia 08/31/2006   HIV disease ([place]) 07/24/2006   Hyperlipidemia 05/31/2006   ERECTILE DYSFUNCTION 05/31/2006   INSOMNIA NOS 05/31/2006    Past Medical History:  Diagnosis Date   Achalasia    Candida infection, esophageal ([place]) 08/2010   a. 2012 - noted on EGD.   DVT (deep venous thrombosis) ([place]) ~ 04/2014   ?RLE   Dysphagia 2008   post heller myotomy/toupee fundoplication.     ESRD (end stage renal disease) on dialysis [facility])    "MWF: [name]" (01/22/2017)   Fall 01/22/2017   mechanical fall w/multiple fractures/notes 01/22/2017   Gait instability    GERD  (gastroesophageal reflux disease)    hx (01/22/2017)   Hemorrhoids, internal    History of blood transfusion 03/2014   "low counts"   History of gout    History of stomach ulcers    HIV infection ([place]) dx ~ 2009   a. 02/13/2014 CD4 = 240 - undetectable viral load.   Hyperlipidemia    Hypertension    hx (01/22/2017)   Insomnia    Lupus nephritis (HCC)    Pancytopenia (HCC)    Premature ventricular contractions    Renal abscess    SLE (systemic lupus erythematosus) ([place]) dx'd 2015    Family History  Problem Relation Age of Onset   Colon cancer Father        deceased   Other Mother        s/p pacemaker - alive and well.   Hypertension Mother    Other Other        5 brothers, 3 sisters - alive and well.   Ovarian cancer Sister    Prostate cancer Brother    Past Surgical History:  Procedure Laterality Date   BASCILIC VEIN TRANSPOSITION Left 06/29/2014   Procedure: FIRST STAGE  [place];  Surgeon: Rosetta Posner, MD;  Location: [place];  Service:  Vascular;  Laterality: Left;   BASCILIC VEIN TRANSPOSITION Left 09/11/2014   Procedure: SECOND STAGE BASILIC VEIN TRANSPOSITION LEFT UPPER ARM;  Surgeon: Rosetta Posner, MD;  Location: [place];  Service: Vascular;  Laterality: Left;   BOWEL DECOMPRESSION N/A 09/29/2018   Procedure: BOWEL DECOMPRESSION;  Surgeon: Laurence Spates, MD;  Location: [place];  Service: Endoscopy;  Laterality: N/A;   COLONOSCOPY  2008   .  tortuous colon, internal hemorrhoids.  no polyps or diverticulosis.    ESOPHAGOGASTRODUODENOSCOPY  08/2010   Dr [name].  08/2010 candida esophagitis, no obvious esophageal stricture. 02/2006 and 05/2006 dilated esophagus, narrowed distal esophagus but no stricture, was empirically [place] dilated   ESOPHAGOGASTRODUODENOSCOPY N/A 06/27/2014   Procedure: ESOPHAGOGASTRODUODENOSCOPY (EGD);  Surgeon: [name], MD;  Location: [facility] ENDOSCOPY;  Service: Endoscopy;  Laterality: N/A;   ESOPHAGOGASTRODUODENOSCOPY N/A 07/02/2014    Procedure: ESOPHAGOGASTRODUODENOSCOPY (EGD);  Surgeon: [name], MD;  Location: [facility] ENDOSCOPY;  Service: Endoscopy;  Laterality: N/A;  with botox   FLEXIBLE SIGMOIDOSCOPY N/A 09/29/2018   Procedure: FLEXIBLE SIGMOIDOSCOPY;  Surgeon: Laurence Spates, MD;  Location: [place];  Service: Endoscopy;  Laterality: N/A;   [place]   with toupee fundoplication of HH.    HERNIA REPAIR     INSERTION OF DIALYSIS CATHETER Right 06/29/2014   Procedure: INSERTION OF DIALYSIS CATHETER RIGHT IJ;  Surgeon: Rosetta Posner, MD;  Location: [place];  Service: Vascular;  Laterality: Right;   KIDNEY TRANSPLANT     January 2020 @ [place] Right ~ 2011   ORIF TIBIA PLATEAU Right 01/23/2017   Procedure: OPEN REDUCTION INTERNAL FIXATION (ORIF) TIBIAL PLATEAU; REPAIR OF LATERAL TIBIAL PLATEAU; ARTHROTOMY WITH MENISCUS REPAIR; ANTERIOR COMPARTMENT FASCIOTOMY;  Surgeon: Altamese Seeley, MD;  Location: [place];  Service: Orthopedics;  Laterality: Right;   Social History   Social History Narrative   Lives in [place] by himself. Does not routinely exercise.   No caffeine   Immunization History  Administered Date(s) Administered   DTaP / Hep B / IPV 08/31/2006, 10/02/2006, 02/19/2007   Hepatitis A, Adult 08/23/2017, 03/21/2018   Hepatitis B 08/31/2006, 10/02/2006, 02/19/2007   Hepatitis B, adult 06/11/2015, 07/16/2015, 08/13/2015, 12/17/2015, 05/19/2016, 06/16/2016, 07/14/2016, 11/11/2016, 08/23/2017, 08/23/2017, 03/21/2018, 03/21/2018, 04/23/2018, 04/23/2018   Influenza Whole 01/22/2008   Influenza, Seasonal, Injecte, Preservative Fre 04/24/2016, 01/05/2017   Influenza-Unspecified 02/02/2016, 01/15/2017, 01/01/2018, 12/30/2018   PFIZER Comirnaty(Gray Top)Covid-19 Tri-Sucrose Vaccine 11/25/2019, 05/04/2020   Pfizer Covid-19 Vaccine Bivalent Booster 17yrs & up 12/31/2020   Pneumococcal Conjugate-13 02/27/2017   Pneumococcal Polysaccharide-23 08/31/2006, 11/26/2015, 07/10/2017   Td 11/01/2004   Tdap  07/10/2017     Objective: Vital Signs: BP (!) 143/77 (BP Location: Right Arm, Patient Position: Sitting, Cuff Size: Normal) Comment: No left BP due to dialysis port  Pulse 88   Resp 15   Ht 5\' 10"  (1.778 m)   Wt 146 lb 9.6 oz (66.5 kg)   BMI 21.03 kg/m    Physical Exam HENT:     Mouth/Throat:     Mouth: Mucous membranes are moist.     Pharynx: Oropharynx is clear.  Eyes:     Conjunctiva/sclera: Conjunctivae normal.  Cardiovascular:     Rate and Rhythm: Normal rate and regular rhythm.     Comments: Left upper arm fistula with palpable thrill Pulmonary:     Effort: Pulmonary effort is normal.     Breath sounds: Normal breath sounds.  Musculoskeletal:     Right lower leg: No edema.  Left lower leg: No edema.  Skin:    General: Skin is warm and dry.     Findings: No rash.  Neurological:     Mental Status: He is alert.     Deep Tendon Reflexes: Reflexes normal.     Comments: Grossly diminished sensation in toes of both feet  Psychiatric:        Mood and Affect: Mood normal.      Musculoskeletal Exam:  Shoulders full ROM no tenderness or swelling Elbows full ROM no tenderness or swelling Wrists full ROM no tenderness or swelling Fingers full ROM no tenderness or swelling No paraspinal tenderness to palpation over upper and lower back Left hip slightly decreased external rotation range of motion and significantly less FABER range of motion as compared to right side, no focal tenderness to palpation or palpable swelling Knees full ROM no tenderness or swelling, very slight patellofemoral crepitus with negative patellar grind test Ankles full ROM no tenderness or swelling Mild lateral deviation first MTP bunions bilaterally and decreased extension range of motion, no swelling or tenderness to palpation   Investigation: No additional findings.  Imaging: XR HIP UNILAT W OR W/O PELVIS 2-3 VIEWS LEFT  Result Date: 11/30/2021 Xray left hip 2 views Visualized portion of  lumbar spine and SI joints show some rotation there is also joint space narrowing and sclerosis more in the right SI joint.  Right hip appears normal.  Left hip with preserved joint space but flattening of femoral head and sclerotic changes.  No erosions or other abnormal calcifications seen. 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  XR KNEE 3 VIEW LEFT  Result Date: 11/30/2021 X-ray left knee 3 views Medial lateral compartment joint space appears normal.  Patellofemoral joint space appears normal.  No visible joint effusions erosions or abnormal calcifications.  Bone mineralization questionable for generalized osteopenia. Impression No significant osteoarthritis   Recent Labs: Lab Results  Component Value Date   WBC 5.3 11/23/2020   HGB 12.4 (L) 11/23/2020   PLT 220 11/23/2020   NA 139 11/23/2020   K 5.4 (H) 11/23/2020   CL 102 11/23/2020   CO2 24 11/23/2020   GLUCOSE 69 (L) 11/23/2020   BUN 29 (H) 11/23/2020   CREATININE 2.94 (H) 11/23/2020   BILITOT 0.8 11/23/2020   ALKPHOS 61 11/23/2020   AST 14 (L) 11/23/2020   ALT 10 11/23/2020   PROT 8.4 (H) 11/23/2020   ALBUMIN 3.9 11/23/2020   CALCIUM 9.3 11/23/2020   GFRAA 18 (L) 07/06/2019    Speciality Comments: No specialty comments available.  Procedures:  No procedures performed Allergies: Nsaids   Assessment / Plan:     Visit Diagnoses: Other forms of systemic lupus erythematosus, unspecified organ involvement status ([place]) - Plan: RNP Antibody, Anti-Smith antibody, Sjogrens syndrome-A extractable nuclear antibody, Sjogrens syndrome-B extractable nuclear antibody, Anti-DNA antibody, double-stranded, C3 and C4, Sedimentation rate  Previous manifestations of arthritis fatigue and renal disease currently mostly just has joint pain at the left hip and upper leg.  Agree with continuing the  hydroxychloroquine 200 mg daily.  His ongoing immunosuppression for renal transplant with prednisone 5 mg and tacrolimus 1.5 mg/day may also be suppressing any inflammatory activity.  We will check antibody markers for disease activity assessment today also serum complements and sedimentation rate.  He had recent blood count renal function panel urine studies and vitamin D checked last month.  Lupus  nephritis [facility])  Patient is established with [name] transplant nephrology service currently managed with 5 mg prednisone daily and Prograf 1 mg AM 0.5 mg PM, most recent renal function panel with estimated GFR of 28 no significant proteinuria.  Osteoporosis, unspecified osteoporosis type, unspecified pathological fracture presence  On Prolia reports most recent dose was about 3 months ago so would be due again in about 3 months if continuing ideally should be arranged within 6 months to avoid treatment labs.  Pain in left leg - Plan: XR HIP UNILAT W OR W/O PELVIS 2-3 VIEWS LEFT, XR KNEE 3 VIEW LEFT, CK  Upper left leg pain there is decreased hip joint range of motion on exam.  X-ray of the left knee without significant arthritis changes.  X-ray of left hip looks suspicious for AVN with height loss and multiple sclerotic changes.  Most recent comparison films from August 2022 without evidence of the same.  Informed patient same day of this result and will recommend for MRI without contrast to definitively assess.  Vitamin D deficiency  Most recent vitamin D level from October 28, 2021 and with elevated parathyroid hormone 105.  Orders: Orders Placed This Encounter  Procedures   XR HIP UNILAT W OR W/O PELVIS 2-3 VIEWS LEFT   XR KNEE 3 VIEW LEFT   MR HIP LEFT WO CONTRAST   RNP Antibody   Anti-Smith antibody   Sjogrens syndrome-A extractable nuclear antibody   Sjogrens syndrome-B extractable nuclear antibody   Anti-DNA antibody, double-stranded   C3 and C4   Sedimentation rate   CK   No orders of  the defined types were placed in this encounter.    Follow-Up Instructions: Return in about 2 months (around 01/30/2022) for New pt SLE HCQ restart/?prolia f/u 98mos.   Collier Salina, MD  Note - This record has been created using [employer].  Chart creation errors have been sought, but may not always  have been located. Such creation errors do not reflect on  the standard of medical care.

## 2021-11-30 ENCOUNTER — Ambulatory Visit: Payer: BC Managed Care – PPO | Attending: Internal Medicine | Admitting: Internal Medicine

## 2021-11-30 ENCOUNTER — Ambulatory Visit (INDEPENDENT_AMBULATORY_CARE_PROVIDER_SITE_OTHER): Payer: BC Managed Care – PPO

## 2021-11-30 ENCOUNTER — Encounter: Payer: Self-pay | Admitting: Internal Medicine

## 2021-11-30 VITALS — BP 143/77 | HR 88 | Resp 15 | Ht 70.0 in | Wt 146.6 lb

## 2021-11-30 DIAGNOSIS — M79605 Pain in left leg: Secondary | ICD-10-CM

## 2021-11-30 DIAGNOSIS — M87052 Idiopathic aseptic necrosis of left femur: Secondary | ICD-10-CM | POA: Insufficient documentation

## 2021-11-30 DIAGNOSIS — M810 Age-related osteoporosis without current pathological fracture: Secondary | ICD-10-CM | POA: Diagnosis not present

## 2021-11-30 DIAGNOSIS — M3214 Glomerular disease in systemic lupus erythematosus: Secondary | ICD-10-CM | POA: Diagnosis not present

## 2021-11-30 DIAGNOSIS — E559 Vitamin D deficiency, unspecified: Secondary | ICD-10-CM | POA: Insufficient documentation

## 2021-11-30 DIAGNOSIS — M328 Other forms of systemic lupus erythematosus: Secondary | ICD-10-CM

## 2021-12-01 LAB — C3 AND C4
C3 Complement: 120 mg/dL (ref 82–185)
C4 Complement: 30 mg/dL (ref 15–53)

## 2021-12-01 LAB — SEDIMENTATION RATE: Sed Rate: 33 mm/h — ABNORMAL HIGH (ref 0–20)

## 2021-12-01 LAB — ANTI-SMITH ANTIBODY: ENA SM Ab Ser-aCnc: <1 AI

## 2021-12-01 LAB — SJOGRENS SYNDROME-B EXTRACTABLE NUCLEAR ANTIBODY: SSB (La) (ENA) Antibody, IgG: <1 AI

## 2021-12-01 LAB — RNP ANTIBODY: Ribonucleic Protein(ENA) Antibody, IgG: <1 AI

## 2021-12-01 LAB — CK: Total CK: 201 U/L — ABNORMAL HIGH (ref 44–196)

## 2021-12-01 LAB — SJOGRENS SYNDROME-A EXTRACTABLE NUCLEAR ANTIBODY: SSA (Ro) (ENA) Antibody, IgG: <1 AI

## 2021-12-01 LAB — ANTI-DNA ANTIBODY, DOUBLE-STRANDED: ds DNA Ab: 19 IU/mL — ABNORMAL HIGH

## 2021-12-02 ENCOUNTER — Telehealth: Payer: Self-pay | Admitting: Internal Medicine

## 2021-12-02 ENCOUNTER — Encounter: Payer: Self-pay | Admitting: Internal Medicine

## 2021-12-02 MED ORDER — HYDROXYCHLOROQUINE SULFATE 200 MG PO TABS: 200.0000 mg | ORAL_TABLET | Freq: Every day | ORAL | 0 refills | Status: DC

## 2021-12-02 NOTE — Telephone Encounter (Signed)
Next Visit: Due October 2023. Patient to call after he gets MRI completed.   Last Visit: 11/30/2021  Labs: 11/30/2021 Lab results show a low level of markers for lupus activity. I think he is okay to just continue the current medications including hydroxychloroquine 200 mg.  Eye exam: not on file   Current Dose per office note 11/30/2021: hydroxychloroquine 200 mg daily  DX: Other forms of systemic lupus erythematosus, unspecified organ involvement status   Okay to refill Plaquenil?

## 2021-12-02 NOTE — Telephone Encounter (Signed)
Patient states he is returning a call to discuss the results of his labwork.

## 2021-12-02 NOTE — Progress Notes (Signed)
Lab results show a low level of markers for lupus activity. I think he is okay to just continue the current medications including hydroxychloroquine 200 mg.  We can follow up plans after he gets the hip MRI since I think the leg pain is from a separate issue.

## 2021-12-10 ENCOUNTER — Ambulatory Visit
Admission: RE | Admit: 2021-12-10 | Discharge: 2021-12-10 | Disposition: A | Discharge status: Home / Self Care | Payer: BC Managed Care – PPO | Source: Ambulatory Visit | Attending: Internal Medicine | Admitting: Internal Medicine

## 2021-12-10 DIAGNOSIS — M79605 Pain in left leg: Secondary | ICD-10-CM

## 2021-12-12 ENCOUNTER — Telehealth: Payer: Self-pay | Admitting: Pharmacist

## 2021-12-12 ENCOUNTER — Other Ambulatory Visit: Payer: Self-pay | Admitting: Internal Medicine

## 2021-12-12 DIAGNOSIS — M8700 Idiopathic aseptic necrosis of unspecified bone: Secondary | ICD-10-CM

## 2021-12-12 NOTE — Telephone Encounter (Addendum)
Please start Prolia BIV through pharmacy AND medical benefit.  Prolia - [id number], [id number]  Dose: 60mg  SQ every 6 months  Dx: osteoporosis (M81.0)  Knox Saliva, PharmD, MPH, BCPS, CPP Clinical Pharmacist (Rheumatology and Pulmonology)  ----- Message from Bertram Savin, RT sent at 12/12/2021  4:09 PM EDT ----- Regarding: Prolia Per Dr. Benjamine Mola, please apply for Prolia.

## 2021-12-12 NOTE — Progress Notes (Signed)
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  He has underlying medical conditions including HIV, SLE, renal transplant status on chronic corticosteroids that contribute to risk of this problem.  He is understandably not excited about the prospect of potentially needing additional surgery.  He does also have multiple risk factors for complications associated with this.  However this problem is likely to progress over time without definitive treatment, otherwise would just be long-term pain management approach.  We will place referral to orthopedic surgery today.

## 2021-12-12 NOTE — Progress Notes (Signed)
I did not schedule follow-up appointment with Kenneth [name] after initial visit since I was awaiting results of upcoming study.  We should probably go ahead and schedule follow-up in about 3 months from his initial visit.  Also looks like he was on Prolia treatment for his bone density. Is he needing to transfer management of this to our office? If so we can go ahead and start that process so he does not have a prolonged gap in treatment.

## 2021-12-27 ENCOUNTER — Other Ambulatory Visit (HOSPITAL_COMMUNITY): Payer: Self-pay

## 2021-12-27 NOTE — Telephone Encounter (Signed)
Patient Advocate Encounter Pharmacy Prior Authorization for [place] has been approved.    PA# [id number] Effective dates: 09.26.23 through 9.26.24  Hannia Matchett B. CPhT P: [phone] F: [phone]  Patient Advocate Encounter Received notification that pharmacy prior authorization for PROLIA is required.   PA submitted on 9.26.23 Key [id number] Status is pending    Luciano Cutter, CPhT Patient Advocate Phone: [phone]   Member must fill with [facility] [phone]

## 2022-01-03 NOTE — Telephone Encounter (Signed)
Medical BIV has been submitted via fax: [phone].   Co-insurance- 40% Deductible $2500 / $0 met Out of pocket $5901 / $0 met Pre certification needed Call reference# [phone]

## 2022-01-04 ENCOUNTER — Encounter: Payer: Self-pay | Admitting: Internal Medicine

## 2022-01-04 ENCOUNTER — Telehealth: Payer: Self-pay | Admitting: Pharmacy Technician

## 2022-01-04 NOTE — Telephone Encounter (Addendum)
Patient has [place]. Will place referral for Prolia to [facility]. Patient qualifies for Prolia copay assistance  Knox Saliva, PharmD, MPH, BCPS, CPP Clinical Pharmacist (Rheumatology and Pulmonology)

## 2022-01-04 NOTE — Telephone Encounter (Addendum)
Dr. Benjamine Mola, [facility] [place] note:  Auth Submission: APPROVED Payer: [place] Medication & CPT/J Code(s) submitted: Prolia (Denosumab) [id number] Route of submission (phone, fax, portal):  Phone # Fax # Auth type: Buy/Bill Units/visits requested: 60MG  Reference number:  Approval from:  to  at [place]: APPROVED ID: [id number] [place]: [id number] PCN: [id number] GR: [id number] CVV: [id number] EXP: [birth year]

## 2022-01-12 ENCOUNTER — Encounter: Payer: Self-pay | Admitting: Internal Medicine

## 2022-01-18 NOTE — Telephone Encounter (Signed)
Patient scheduled for Prolia appt on 02/01/22 at [facility]. Will f/u to ensure completed  Knox Saliva, PharmD, MPH, BCPS, CPP Clinical Pharmacist (Rheumatology and Pulmonology)

## 2022-02-01 ENCOUNTER — Ambulatory Visit (INDEPENDENT_AMBULATORY_CARE_PROVIDER_SITE_OTHER): Payer: BC Managed Care – PPO

## 2022-02-01 VITALS — BP 147/73 | HR 70 | Temp 98.0°F | Resp 18 | Wt 143.8 lb

## 2022-02-01 DIAGNOSIS — S329XXS Fracture of unspecified parts of lumbosacral spine and pelvis, sequela: Secondary | ICD-10-CM

## 2022-02-01 DIAGNOSIS — M810 Age-related osteoporosis without current pathological fracture: Secondary | ICD-10-CM

## 2022-02-01 MED ORDER — DENOSUMAB 60 MG/ML ~~LOC~~ SOSY
60.0000 mg | PREFILLED_SYRINGE | Freq: Once | SUBCUTANEOUS | Status: AC
  Administered 2022-02-01: 60 mg via SUBCUTANEOUS

## 2022-02-01 NOTE — Progress Notes (Signed)
Diagnosis: Osteoporosis  Provider:  Praveen Mannam MD  Procedure: Injection  Prolia (Denosumab), Dose: 60 mg, Site: subcutaneous, Number of injections: 1  Post Care: Observation period completed  Discharge: Condition: Good, Destination: Home . AVS provided to patient.   Performed by:  Carleen Rhue, RN       

## 2022-03-03 ENCOUNTER — Ambulatory Visit: Payer: BC Managed Care – PPO | Admitting: Internal Medicine

## 2022-04-12 NOTE — Progress Notes (Signed)
Office Visit Note  Patient: Kenneth [name]             Date of Birth: 05-28-1955           MRN: [id number]             PCP: Malena Peer, MD Referring: Malena Peer, MD Visit Date: 04/13/2022   Subjective:  Follow-up (Doing good)   History of Present Illness: Kenneth [name] is a 67 y.o. male here for follow up for systemic lupus on hydroxychloroquine 200 mg daily he is also on Prograf 1 mg AM and 0.5 mg PM and prednisone 5 mg for maintenance of renal transplant.  Workup at our initial visit did show some persistent elevated lab markers of inflammation including sedimentation rate and low positive double-stranded DNA.  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  Overall he has been doing well with no flareups or symptom exacerbation.  Previous HPI 11/30/21 Kenneth [name] is a 67 y.o. male here to establish care for systemic lupus.  Medical history is also significant for well-controlled HIV. 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  He saw Dr. Dossie Der for local rheumatology provider previously.  Initial symptoms included joint pain and swelling in multiple areas as well as severe fatigue and the kidney inflammation.  He was previously treated with hydroxychloroquine and oral prednisone for lupus though extrarenal disease activity did not appear to include other organ threatening involvement and was on hemodialysis shortly after diagnosis.  Joint pains have been pretty well controlled recently his worst complaint is in the left leg raise had stiffness and soreness ongoing in the past few months.  Pain in the thigh and knee lasting several minutes each morning but she describes some clicking or cracking sensation.  He was restarted on the hydroxychloroquine  suspicious of redevelopment for lupus related arthralgias symptoms are a little bit better today but has not been on the medication very long.  He does have numbness affecting the toes of his feet there is no radiation of pain down the leg no extension of numbness past the forefoot.   He had a complicated series of medical events in 2020 he underwent renal transplant immunosuppression originally CellCept then Prograf and prednisone which is his current regimen.  Had complication from multiple falls resulting in a large fracture of the sacrum and fracture of the greater trochanter.  Fragility fractures attributed to chronic renal disease and long-term use of glucocorticoids.  He also developed severe infection complicated by C. difficile requiring partial colectomy and temporarily had an ostomy for managing this.  He was put on treatment with Prolia for about the past 3 years most recent dose approximately 3 months ago.   He denies any history of Raynaud's symptoms no oral or nasal ulcers no alopecia is no photosensitive skin rashes.   Review of Systems  Constitutional:  Negative for fatigue.  HENT:  Negative for mouth sores and mouth dryness.   Eyes:  Negative for dryness.  Respiratory:  Negative for shortness of breath.   Cardiovascular:  Negative for chest pain and palpitations.  Gastrointestinal:  Negative for blood in stool, constipation and diarrhea.  Endocrine: Negative for increased urination.  Genitourinary:  Positive for involuntary urination.  Musculoskeletal:  Positive for joint pain, joint pain, myalgias, morning  stiffness and myalgias. Negative for gait problem, joint swelling, muscle weakness and muscle tenderness.  Skin:  Negative for color change, hair loss and sensitivity to sunlight.  Allergic/Immunologic: Negative for susceptible to infections.  Neurological:  Negative for dizziness and headaches.  Hematological:  Negative for swollen glands.  Psychiatric/Behavioral:  Negative  for depressed mood and sleep disturbance. The patient is not nervous/anxious.     PMFS History:  Patient Active Problem List   Diagnosis Date Noted   High risk medication use 04/13/2022   Avascular necrosis of bone of left hip ([place]) 11/30/2021   Vitamin D deficiency 11/30/2021   History of creation of ostomy ([place]) 01/22/2019   Volvulus of sigmoid colon S/P colostomy(HCC) 12/31/2018   Adult failure to thrive 12/04/2018   Greater trochanter fracture ([place]) 10/23/2018   Severe sepsis ([place]) 09/29/2018   Clostridium enterocolitis 09/29/2018   ATN (acute tubular necrosis) ([place]) 09/29/2018   Ileus ([place]) 09/29/2018   Leucopenia    Current chronic use of systemic steroids 09/24/2018   History of esophageal ulcer 09/21/2018   History of renal transplant 09/19/2018   Pelvic fracture ([place]) 08/25/2018   Multiple falls 08/25/2018   Thumb fracture 08/25/2018   Closed nondisplaced zone I fracture of sacrum ([place]) 08/22/2018   Tachycardia 08/22/2018   Suprapubic pain 04/11/2018   Osteoporosis 03/15/2017   Chronic fatigue 03/11/2015   SLE (systemic lupus erythematosus) ([place]) 02/24/2015   AKI (acute kidney injury) ([place])    Dysphagia    ESRD (end stage renal disease) ([place])    DVT (deep venous thrombosis) ([place]) 06/22/2014   PVC's (premature ventricular contractions) 03/17/2014   Abdominal pain    Protein-calorie malnutrition, severe ([place]) 02/11/2014   Lupus nephritis (HCC)    Arthralgia of multiple sites, bilateral 02/02/2014   Bilateral low back pain without sciatica 02/01/2014   EXTERNAL HEMORRHOIDS WITHOUT MENTION COMP 01/20/2009   Macrocytic anemia 08/31/2006   HIV disease ([place]) 07/24/2006   Hyperlipidemia 05/31/2006   ERECTILE DYSFUNCTION 05/31/2006   INSOMNIA NOS 05/31/2006    Past Medical History:  Diagnosis Date   Achalasia    Candida infection, esophageal ([place]) 08/2010   a. 2012 - noted on EGD.   DVT (deep venous thrombosis) ([place]) ~ 04/2014   ?RLE   Dysphagia 2008   post heller  myotomy/toupee fundoplication.     ESRD (end stage renal disease) on dialysis [facility])    "MWF: [name]" (01/22/2017)   Fall 01/22/2017   mechanical fall w/multiple fractures/notes 01/22/2017   Gait instability    GERD (gastroesophageal reflux disease)    hx (01/22/2017)   Hemorrhoids, internal    History of blood transfusion 03/2014   "low counts"   History of gout    History of stomach ulcers    HIV infection ([place]) dx ~ 2009   a. 02/13/2014 CD4 = 240 - undetectable viral load.   Hyperlipidemia    Hypertension    hx (01/22/2017)   Insomnia    Lupus nephritis (HCC)    Pancytopenia (HCC)    Premature ventricular contractions    Renal abscess    SLE (systemic lupus erythematosus) ([place]) dx'd 2015    Family History  Problem Relation Age of Onset   Colon cancer Father        deceased   Other Mother        s/p pacemaker - alive and well.   Hypertension Mother    Other Other        5 brothers, 3 sisters - alive  and well.   Ovarian cancer Sister    Prostate cancer Brother    Past Surgical History:  Procedure Laterality Date   BASCILIC VEIN TRANSPOSITION Left 06/29/2014   Procedure: FIRST STAGE  [place];  Surgeon: Rosetta Posner, MD;  Location: [place];  Service: Vascular;  Laterality: Left;   [place] Left 09/11/2014   Procedure: SECOND STAGE BASILIC VEIN TRANSPOSITION LEFT UPPER ARM;  Surgeon: Rosetta Posner, MD;  Location: [place];  Service: Vascular;  Laterality: Left;   BOWEL DECOMPRESSION N/A 09/29/2018   Procedure: BOWEL DECOMPRESSION;  Surgeon: Laurence Spates, MD;  Location: [place];  Service: Endoscopy;  Laterality: N/A;   COLONOSCOPY  2008   .  tortuous colon, internal hemorrhoids.  no polyps or diverticulosis.    ESOPHAGOGASTRODUODENOSCOPY  08/2010   Dr [name].  08/2010 candida esophagitis, no obvious esophageal stricture. 02/2006 and 05/2006 dilated esophagus, narrowed distal esophagus but no stricture, was empirically [place] dilated    ESOPHAGOGASTRODUODENOSCOPY N/A 06/27/2014   Procedure: ESOPHAGOGASTRODUODENOSCOPY (EGD);  Surgeon: [name], MD;  Location: [facility] ENDOSCOPY;  Service: Endoscopy;  Laterality: N/A;   ESOPHAGOGASTRODUODENOSCOPY N/A 07/02/2014   Procedure: ESOPHAGOGASTRODUODENOSCOPY (EGD);  Surgeon: [name], MD;  Location: [facility] ENDOSCOPY;  Service: Endoscopy;  Laterality: N/A;  with botox   FLEXIBLE SIGMOIDOSCOPY N/A 09/29/2018   Procedure: FLEXIBLE SIGMOIDOSCOPY;  Surgeon: Laurence Spates, MD;  Location: [place];  Service: Endoscopy;  Laterality: N/A;   [place]   with toupee fundoplication of HH.    HERNIA REPAIR     INSERTION OF DIALYSIS CATHETER Right 06/29/2014   Procedure: INSERTION OF DIALYSIS CATHETER RIGHT IJ;  Surgeon: Rosetta Posner, MD;  Location: [place];  Service: Vascular;  Laterality: Right;   KIDNEY TRANSPLANT     January 2020 @ [place] Right ~ 2011   ORIF TIBIA PLATEAU Right 01/23/2017   Procedure: OPEN REDUCTION INTERNAL FIXATION (ORIF) TIBIAL PLATEAU; REPAIR OF LATERAL TIBIAL PLATEAU; ARTHROTOMY WITH MENISCUS REPAIR; ANTERIOR COMPARTMENT FASCIOTOMY;  Surgeon: [place], MD;  Location: [place];  Service: Orthopedics;  Laterality: Right;   Social History   Social History Narrative   Lives in [place] by himself. Does not routinely exercise.   No caffeine   Immunization History  Administered Date(s) Administered   DTaP / Hep B / IPV 08/31/2006, 10/02/2006, 02/19/2007   Hepatitis A, Adult 08/23/2017, 03/21/2018   Hepatitis B 08/31/2006, 10/02/2006, 02/19/2007   Hepatitis B, adult 06/11/2015, 07/16/2015, 08/13/2015, 12/17/2015, 05/19/2016, 06/16/2016, 07/14/2016, 11/11/2016, 08/23/2017, 08/23/2017, 03/21/2018, 03/21/2018, 04/23/2018, 04/23/2018   Influenza Whole 01/22/2008   Influenza, Seasonal, Injecte, Preservative Fre 04/24/2016, 01/05/2017   Influenza-Unspecified 02/02/2016, 01/15/2017, 01/01/2018, 12/30/2018   PFIZER Comirnaty(Gray Top)Covid-19 Tri-Sucrose Vaccine  11/25/2019, 05/04/2020   Pfizer Covid-19 Vaccine Bivalent Booster 23yrs & up 12/31/2020   Pneumococcal Conjugate-13 02/27/2017   Pneumococcal Polysaccharide-23 08/31/2006, 11/26/2015, 07/10/2017   Td 11/01/2004   Tdap 07/10/2017     Objective: Vital Signs: BP (!) 177/80 (BP Location: Right Arm, Patient Position: Sitting, Cuff Size: Normal)   Pulse 80   Ht 5\' 11"  (1.803 m)   Wt 146 lb (66.2 kg)   BMI 20.36 kg/m    Physical Exam Eyes:     Conjunctiva/sclera: Conjunctivae normal.  Cardiovascular:     Rate and Rhythm: Normal rate and regular rhythm.  Pulmonary:     Effort: Pulmonary effort is normal.     Breath sounds: Normal breath sounds.  Musculoskeletal:     Right lower leg: No edema.  Left lower leg: No edema.  Lymphadenopathy:     Cervical: No cervical adenopathy.  Skin:    General: Skin is warm and dry.     Findings: No rash.  Neurological:     Mental Status: He is alert.  Psychiatric:        Mood and Affect: Mood normal.      Musculoskeletal Exam:  Shoulders full ROM no tenderness or swelling Elbows full ROM no tenderness or swelling Wrists full ROM no tenderness or swelling Fingers full ROM no tenderness or swelling Left hip with decreased internal rotation range of motion and mildly decreased extension range of motion tolerated, no focal tenderness to palpation over lateral side, right hip normal Knees full ROM no tenderness or swelling   Investigation: No additional findings.  Imaging: No results found.  Recent Labs: Lab Results  Component Value Date   WBC 7.2 04/13/2022   HGB 14.3 04/13/2022   PLT 214 04/13/2022   NA 144 04/13/2022   K 4.7 04/13/2022   CL 105 04/13/2022   CO2 25 04/13/2022   GLUCOSE 92 04/13/2022   BUN 24 04/13/2022   CREATININE 2.29 (H) 04/13/2022   BILITOT 0.4 04/13/2022   ALKPHOS 61 11/23/2020   AST 21 04/13/2022   ALT 26 04/13/2022   PROT 8.2 (H) 04/13/2022   ALBUMIN 3.9 11/23/2020   CALCIUM 10.1 04/13/2022    GFRAA 18 (L) 07/06/2019    Speciality Comments: PLQ Eye Exam: 05/26/2021 WNL @ [place] Follow up in 1 year.   Procedures:  No procedures performed Allergies: Nsaids   Assessment / Plan:     Visit Diagnoses: Other forms of systemic lupus erythematosus, unspecified organ involvement status ([place]) - Plan: Sedimentation rate, Anti-DNA antibody, double-stranded  Symptoms appear to be well-controlled laboratory tests remain abnormal though improving at last visit.  Hip pain is pretty stable.  Will recheck sedimentation rate and double-stranded DNA antibody titer today.  Plan to continue hydroxychloroquine 200 mg daily.  He is on a maintenance regimen with tacrolimus and prednisone 5 mg daily being managed through transplant clinic.  Osteoporosis, unspecified osteoporosis type, unspecified pathological fracture presence  He received his dose of Prolia on November 1 without incident.  Should follow-up with repeat dose at 6 month interval  Lupus nephritis (HCC)  No significant changes with fluid retention.  Rechecking metabolic panel today for this as well as medication monitoring.  He is scheduled for follow-up with his transplant clinic later this month.  High risk medication use - Plan: CBC with Differential/Platelet, COMPLETE METABOLIC PANEL WITH GFR  Checking CBC and CMP for medication monitoring on long-term use of hydroxychloroquine tacrolimus and prednisone from transplant.  Most recent retinal toxicity screening exam was February last year he is due to update this next month or as soon as possible afterwards.  Orders: Orders Placed This Encounter  Procedures   CBC with Differential/Platelet   COMPLETE METABOLIC PANEL WITH GFR   Sedimentation rate   Anti-DNA antibody, double-stranded   No orders of the defined types were placed in this encounter.    Follow-Up Instructions: Return in about 4 months (around 08/12/2022) for SLE on HCQ/GC f/u 13mos.   Collier Salina, MD  Note - This record has been created using [employer].  Chart creation errors have been sought, but may not always  have been located. Such creation errors do not reflect on  the standard of medical care.

## 2022-04-13 ENCOUNTER — Ambulatory Visit: Payer: BC Managed Care – PPO | Attending: Internal Medicine | Admitting: Internal Medicine

## 2022-04-13 ENCOUNTER — Encounter: Payer: Self-pay | Admitting: Internal Medicine

## 2022-04-13 VITALS — BP 177/80 | HR 80 | Ht 71.0 in | Wt 146.0 lb

## 2022-04-13 DIAGNOSIS — M328 Other forms of systemic lupus erythematosus: Secondary | ICD-10-CM

## 2022-04-13 DIAGNOSIS — M810 Age-related osteoporosis without current pathological fracture: Secondary | ICD-10-CM | POA: Diagnosis not present

## 2022-04-13 DIAGNOSIS — M3214 Glomerular disease in systemic lupus erythematosus: Secondary | ICD-10-CM

## 2022-04-13 DIAGNOSIS — Z79899 Other long term (current) drug therapy: Secondary | ICD-10-CM | POA: Diagnosis not present

## 2022-04-15 LAB — COMPLETE METABOLIC PANEL WITH GFR
AG Ratio: 1.2 (calc) (ref 1.0–2.5)
ALT: 26 U/L (ref 9–46)
AST: 21 U/L (ref 10–35)
Albumin: 4.4 g/dL (ref 3.6–5.1)
Alkaline phosphatase (APISO): 74 U/L (ref 35–144)
BUN/Creatinine Ratio: 10 (calc) (ref 6–22)
BUN: 24 mg/dL (ref 7–25)
CO2: 25 mmol/L (ref 20–32)
Calcium: 10.1 mg/dL (ref 8.6–10.3)
Chloride: 105 mmol/L (ref 98–110)
Creat: 2.29 mg/dL — ABNORMAL HIGH (ref 0.70–1.35)
Globulin: 3.8 g/dL (calc) — ABNORMAL HIGH (ref 1.9–3.7)
Glucose, Bld: 92 mg/dL (ref 65–99)
Potassium: 4.7 mmol/L (ref 3.5–5.3)
Sodium: 144 mmol/L (ref 135–146)
Total Bilirubin: 0.4 mg/dL (ref 0.2–1.2)
Total Protein: 8.2 g/dL — ABNORMAL HIGH (ref 6.1–8.1)
eGFR: 31 mL/min/{1.73_m2} — ABNORMAL LOW (ref >=60)

## 2022-04-15 LAB — CBC WITH DIFFERENTIAL/PLATELET
Absolute Monocytes: 698 cells/uL (ref 200–950)
Basophils Absolute: 29 cells/uL (ref 0–200)
Basophils Relative: 0.4 %
Eosinophils Absolute: 122 cells/uL (ref 15–500)
Eosinophils Relative: 1.7 %
HCT: 42.9 % (ref 38.5–50.0)
Hemoglobin: 14.3 g/dL (ref 13.2–17.1)
Lymphs Abs: 1577 cells/uL (ref 850–3900)
MCH: 30.6 pg (ref 27.0–33.0)
MCHC: 33.3 g/dL (ref 32.0–36.0)
MCV: 91.7 fL (ref 80.0–100.0)
MPV: 11.2 fL (ref 7.5–12.5)
Monocytes Relative: 9.7 %
Neutro Abs: 4774 cells/uL (ref 1500–7800)
Neutrophils Relative %: 66.3 %
Platelets: 214 10*3/uL (ref 140–400)
RBC: 4.68 10*6/uL (ref 4.20–5.80)
RDW: 14.4 % (ref 11.0–15.0)
Total Lymphocyte: 21.9 %
WBC: 7.2 10*3/uL (ref 3.8–10.8)

## 2022-04-15 LAB — ANTI-DNA ANTIBODY, DOUBLE-STRANDED: ds DNA Ab: 19 IU/mL — ABNORMAL HIGH

## 2022-04-15 LAB — SEDIMENTATION RATE: Sed Rate: 17 mm/h (ref 0–20)

## 2022-07-04 ENCOUNTER — Encounter: Payer: Self-pay | Admitting: Internal Medicine

## 2022-07-18 ENCOUNTER — Telehealth: Payer: Self-pay | Admitting: Pharmacist

## 2022-07-18 DIAGNOSIS — E559 Vitamin D deficiency, unspecified: Secondary | ICD-10-CM

## 2022-07-18 DIAGNOSIS — Z79899 Other long term (current) drug therapy: Secondary | ICD-10-CM

## 2022-07-18 DIAGNOSIS — M810 Age-related osteoporosis without current pathological fracture: Secondary | ICD-10-CM

## 2022-07-18 NOTE — Telephone Encounter (Signed)
Patient due for Prolia on 07/31/22. It is already scheduled at [facility] infusion center on 08/03/22  Left VM for pt requesting updated labs. MyChart message sent. Future order for CMET and Vitamin D placed today  Chesley Mires, PharmD, MPH, BCPS, CPP Clinical Pharmacist (Rheumatology and Pulmonology)

## 2022-07-27 ENCOUNTER — Other Ambulatory Visit: Payer: Self-pay | Admitting: *Deleted

## 2022-07-27 DIAGNOSIS — M810 Age-related osteoporosis without current pathological fracture: Secondary | ICD-10-CM

## 2022-07-27 DIAGNOSIS — E559 Vitamin D deficiency, unspecified: Secondary | ICD-10-CM

## 2022-07-27 DIAGNOSIS — Z79899 Other long term (current) drug therapy: Secondary | ICD-10-CM

## 2022-07-28 LAB — COMPREHENSIVE METABOLIC PANEL
AG Ratio: 1.3 (calc) (ref 1.0–2.5)
ALT: 17 U/L (ref 9–46)
AST: 16 U/L (ref 10–35)
Albumin: 4.1 g/dL (ref 3.6–5.1)
Alkaline phosphatase (APISO): 62 U/L (ref 35–144)
BUN/Creatinine Ratio: 10 (calc) (ref 6–22)
BUN: 24 mg/dL (ref 7–25)
CO2: 24 mmol/L (ref 20–32)
Calcium: 9.2 mg/dL (ref 8.6–10.3)
Chloride: 107 mmol/L (ref 98–110)
Creat: 2.29 mg/dL — ABNORMAL HIGH (ref 0.70–1.35)
Globulin: 3.2 g/dL (calc) (ref 1.9–3.7)
Glucose, Bld: 119 mg/dL — ABNORMAL HIGH (ref 65–99)
Potassium: 4.8 mmol/L (ref 3.5–5.3)
Sodium: 142 mmol/L (ref 135–146)
Total Bilirubin: 0.4 mg/dL (ref 0.2–1.2)
Total Protein: 7.3 g/dL (ref 6.1–8.1)

## 2022-07-28 LAB — VITAMIN D 25 HYDROXY (VIT D DEFICIENCY, FRACTURES): Vit D, 25-Hydroxy: 20 ng/mL — ABNORMAL LOW (ref 30–100)

## 2022-08-03 ENCOUNTER — Ambulatory Visit (INDEPENDENT_AMBULATORY_CARE_PROVIDER_SITE_OTHER): Payer: BC Managed Care – PPO | Admitting: *Deleted

## 2022-08-03 VITALS — BP 158/82 | HR 92 | Temp 98.0°F | Resp 16 | Ht 71.0 in | Wt 140.6 lb

## 2022-08-03 DIAGNOSIS — M810 Age-related osteoporosis without current pathological fracture: Secondary | ICD-10-CM

## 2022-08-03 DIAGNOSIS — S329XXS Fracture of unspecified parts of lumbosacral spine and pelvis, sequela: Secondary | ICD-10-CM

## 2022-08-03 MED ORDER — DENOSUMAB 60 MG/ML ~~LOC~~ SOSY
60.0000 mg | PREFILLED_SYRINGE | Freq: Once | SUBCUTANEOUS | Status: AC
  Administered 2022-08-03: 60 mg via SUBCUTANEOUS
  Filled 2022-08-03: qty 1

## 2022-08-03 NOTE — Progress Notes (Signed)
Diagnosis: Osteoporosis  Provider:  Praveen Mannam MD  Procedure: Injection  Prolia (Denosumab), Dose: 60 mg, Site: subcutaneous, Number of injections: 1  Post Care: Observation period completed  Discharge: Condition: Good, Destination: Home . AVS Provided  Performed by:  Major Santerre A, RN       

## 2022-08-06 NOTE — Progress Notes (Unsigned)
Office Visit Note  Patient: Kenneth [name]             Date of Birth: 1955/09/29           MRN: [id number]             PCP: Penelope Galas, MD Referring: Penelope Galas, MD Visit Date: 08/07/2022   Subjective:  No chief complaint on file.   History of Present Illness: Kenneth [name] is a 67 y.o. male here for follow up ***   Previous HPI 04/13/22 Kenneth [name] is a 67 y.o. male here for follow up for systemic lupus on hydroxychloroquine 200 mg daily he is also on Prograf 1 mg AM and 0.5 mg PM and prednisone 5 mg for maintenance of renal transplant.  Workup at our initial visit did show some persistent elevated lab markers of inflammation including sedimentation rate and low positive double-stranded DNA.  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  Overall he has been doing well with no flareups or symptom exacerbation.   Previous HPI 11/30/21 Kenneth [name] is a 67 y.o. male here to establish care for systemic lupus.  Medical history is also significant for well-controlled HIV. 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  He saw Dr. Kathi Ludwig for local rheumatology provider previously.  Initial symptoms included joint pain and swelling in multiple areas as well as severe fatigue and the kidney inflammation.  He was previously treated with hydroxychloroquine and oral prednisone for lupus though extrarenal disease activity did not appear to include other organ threatening involvement and was on hemodialysis shortly after diagnosis.  Joint pains have been pretty well controlled recently his worst complaint is in the left leg raise had stiffness and soreness ongoing in the past few months.  Pain in the thigh and knee lasting several minutes each morning but she  describes some clicking or cracking sensation.  He was restarted on the hydroxychloroquine suspicious of redevelopment for lupus related arthralgias symptoms are a little bit better today but has not been on the medication very long.  He does have numbness affecting the toes of his feet there is no radiation of pain down the leg no extension of numbness past the forefoot.   He had a complicated series of medical events in 2020 he underwent renal transplant immunosuppression originally CellCept then Prograf and prednisone which is his current regimen.  Had complication from multiple falls resulting in a large fracture of the sacrum and fracture of the greater trochanter.  Fragility fractures attributed to chronic renal disease and long-term use of glucocorticoids.  He also developed severe infection complicated by C. difficile requiring partial colectomy and temporarily had an ostomy for managing this.  He was put on treatment with Prolia for about the past 3 years most recent dose approximately 3 months ago.   He denies any history of Raynaud's symptoms no oral or nasal ulcers no alopecia is no photosensitive skin rashes.   No Rheumatology ROS completed.   PMFS History:  Patient Active Problem List   Diagnosis Date Noted   High risk medication use 04/13/2022   Avascular necrosis of bone of left hip (HCC) 11/30/2021   Vitamin D deficiency 11/30/2021   History of creation of ostomy (HCC) 01/22/2019   Volvulus of sigmoid colon S/P  colostomy(HCC) 12/31/2018   Adult failure to thrive 12/04/2018   Greater trochanter fracture (HCC) 10/23/2018   Severe sepsis (HCC) 09/29/2018   Clostridium enterocolitis 09/29/2018   ATN (acute tubular necrosis) (HCC) 09/29/2018   Ileus (HCC) 09/29/2018   Leucopenia    Current chronic use of systemic steroids 09/24/2018   History of esophageal ulcer 09/21/2018   History of renal transplant 09/19/2018   Pelvic fracture (HCC) 08/25/2018   Multiple falls 08/25/2018    Thumb fracture 08/25/2018   Closed nondisplaced zone I fracture of sacrum (HCC) 08/22/2018   Tachycardia 08/22/2018   Suprapubic pain 04/11/2018   Osteoporosis 03/15/2017   Chronic fatigue 03/11/2015   SLE (systemic lupus erythematosus) (HCC) 02/24/2015   AKI (acute kidney injury) (HCC)    Dysphagia    ESRD (end stage renal disease) (HCC)    DVT (deep venous thrombosis) (HCC) 06/22/2014   PVC's (premature ventricular contractions) 03/17/2014   Abdominal pain    Protein-calorie malnutrition, severe (HCC) 02/11/2014   Lupus nephritis (HCC)    Arthralgia of multiple sites, bilateral 02/02/2014   Bilateral low back pain without sciatica 02/01/2014   EXTERNAL HEMORRHOIDS WITHOUT MENTION COMP 01/20/2009   Macrocytic anemia 08/31/2006   HIV disease (HCC) 07/24/2006   Hyperlipidemia 05/31/2006   ERECTILE DYSFUNCTION 05/31/2006   INSOMNIA NOS 05/31/2006    Past Medical History:  Diagnosis Date   Achalasia    Candida infection, esophageal (HCC) 08/2010   a. 2012 - noted on EGD.   DVT (deep venous thrombosis) (HCC) ~ 04/2014   ?RLE   Dysphagia 2008   post heller myotomy/toupee fundoplication.     ESRD (end stage renal disease) on dialysis [facility])    "MWF: [facility]" (01/22/2017)   Fall 01/22/2017   mechanical fall w/multiple fractures/notes 01/22/2017   Gait instability    GERD (gastroesophageal reflux disease)    hx (01/22/2017)   Hemorrhoids, internal    History of blood transfusion 03/2014   "low counts"   History of gout    History of stomach ulcers    HIV infection (HCC) dx ~ 2009   a. 02/13/2014 CD4 = 240 - undetectable viral load.   Hyperlipidemia    Hypertension    hx (01/22/2017)   Insomnia    Lupus nephritis (HCC)    Pancytopenia (HCC)    Premature ventricular contractions    Renal abscess    SLE (systemic lupus erythematosus) (HCC) dx'd 2015    Family History  Problem Relation Age of Onset   Colon cancer Father        deceased   Other Mother        s/p  pacemaker - alive and well.   Hypertension Mother    Other Other        5 brothers, 3 sisters - alive and well.   Ovarian cancer Sister    Prostate cancer Brother    Past Surgical History:  Procedure Laterality Date   BASCILIC VEIN TRANSPOSITION Left 06/29/2014   Procedure: FIRST STAGE  BASCILIC VEIN TRANSPOSITION;  Surgeon: Larina Earthly, MD;  Location: [facility] OR;  Service: Vascular;  Laterality: Left;   BASCILIC VEIN TRANSPOSITION Left 09/11/2014   Procedure: SECOND STAGE BASILIC VEIN TRANSPOSITION LEFT UPPER ARM;  Surgeon: Larina Earthly, MD;  Location: [facility] OR;  Service: Vascular;  Laterality: Left;   BOWEL DECOMPRESSION N/A 09/29/2018   Procedure: BOWEL DECOMPRESSION;  Surgeon: Carman Ching, MD;  Location: [facility] ENDOSCOPY;  Service: Endoscopy;  Laterality: N/A;   COLONOSCOPY  2008   .  tortuous colon, internal hemorrhoids.  no polyps or diverticulosis.    ESOPHAGOGASTRODUODENOSCOPY  08/2010   Dr Randa Evens.  08/2010 candida esophagitis, no obvious esophageal stricture. 02/2006 and 05/2006 dilated esophagus, narrowed distal esophagus but no stricture, was empirically Savary dilated   ESOPHAGOGASTRODUODENOSCOPY N/A 06/27/2014   Procedure: ESOPHAGOGASTRODUODENOSCOPY (EGD);  Surgeon: Dorena Cookey, MD;  Location: [facility] ENDOSCOPY;  Service: Endoscopy;  Laterality: N/A;   ESOPHAGOGASTRODUODENOSCOPY N/A 07/02/2014   Procedure: ESOPHAGOGASTRODUODENOSCOPY (EGD);  Surgeon: Dorena Cookey, MD;  Location: [facility] ENDOSCOPY;  Service: Endoscopy;  Laterality: N/A;  with botox   FLEXIBLE SIGMOIDOSCOPY N/A 09/29/2018   Procedure: FLEXIBLE SIGMOIDOSCOPY;  Surgeon: Carman Ching, MD;  Location: [facility] ENDOSCOPY;  Service: Endoscopy;  Laterality: N/A;   HELLER MYOTOMY  1999   with toupee fundoplication of HH.    HERNIA REPAIR     INSERTION OF DIALYSIS CATHETER Right 06/29/2014   Procedure: INSERTION OF DIALYSIS CATHETER RIGHT IJ;  Surgeon: Larina Earthly, MD;  Location: [facility] OR;  Service: Vascular;  Laterality: Right;   KIDNEY TRANSPLANT      January 2020 @ [facility]   NEPHRECTOMY Right ~ 2011   ORIF TIBIA PLATEAU Right 01/23/2017   Procedure: OPEN REDUCTION INTERNAL FIXATION (ORIF) TIBIAL PLATEAU; REPAIR OF LATERAL TIBIAL PLATEAU; ARTHROTOMY WITH MENISCUS REPAIR; ANTERIOR COMPARTMENT FASCIOTOMY;  Surgeon: Myrene Galas, MD;  Location: [facility] OR;  Service: Orthopedics;  Laterality: Right;   Social History   Social History Narrative   Lives in [place] by himself. Does not routinely exercise.   No caffeine   Immunization History  Administered Date(s) Administered   DTaP / Hep B / IPV 08/31/2006, 10/02/2006, 02/19/2007   Hepatitis A, Adult 08/23/2017, 03/21/2018   Hepatitis B 08/31/2006, 10/02/2006, 02/19/2007   Hepatitis B, ADULT 06/11/2015, 07/16/2015, 08/13/2015, 12/17/2015, 05/19/2016, 06/16/2016, 07/14/2016, 11/11/2016, 08/23/2017, 08/23/2017, 03/21/2018, 03/21/2018, 04/23/2018, 04/23/2018   Influenza Whole 01/22/2008   Influenza, Seasonal, Injecte, Preservative Fre 04/24/2016, 01/05/2017   Influenza-Unspecified 02/02/2016, 01/15/2017, 01/01/2018, 12/30/2018   PFIZER Comirnaty(Gray Top)Covid-19 Tri-Sucrose Vaccine 11/25/2019, 05/04/2020   Pfizer Covid-19 Vaccine Bivalent Booster 10yrs & up 12/31/2020   Pneumococcal Conjugate-13 02/27/2017   Pneumococcal Polysaccharide-23 08/31/2006, 11/26/2015, 07/10/2017   Td 11/01/2004   Tdap 07/10/2017     Objective: Vital Signs: There were no vitals taken for this visit.   Physical Exam   Musculoskeletal Exam: ***  CDAI Exam: CDAI Score: -- Patient Global: --; Provider Global: -- Swollen: --; Tender: -- Joint Exam 08/07/2022   No joint exam has been documented for this visit   There is currently no information documented on the homunculus. Go to the Rheumatology activity and complete the homunculus joint exam.  Investigation: No additional findings.  Imaging: No results found.  Recent Labs: Lab Results  Component Value Date   WBC 7.2 04/13/2022   HGB 14.3 04/13/2022   PLT  214 04/13/2022   NA 142 07/27/2022   K 4.8 07/27/2022   CL 107 07/27/2022   CO2 24 07/27/2022   GLUCOSE 119 (H) 07/27/2022   BUN 24 07/27/2022   CREATININE 2.29 (H) 07/27/2022   BILITOT 0.4 07/27/2022   ALKPHOS 61 11/23/2020   AST 16 07/27/2022   ALT 17 07/27/2022   PROT 7.3 07/27/2022   ALBUMIN 3.9 11/23/2020   CALCIUM 9.2 07/27/2022   GFRAA 18 (L) 07/06/2019    Speciality Comments: PLQ Eye Exam: 05/31/2022 WNL @ [facility] Follow up in 1 year.   Procedures:  No procedures performed Allergies: Nsaids   Assessment / Plan:  Visit Diagnoses: No diagnosis found.  ***  Orders: No orders of the defined types were placed in this encounter.  No orders of the defined types were placed in this encounter.    Follow-Up Instructions: No follow-ups on file.   Collier Salina, MD  Note - This record has been created using [employer].  Chart creation errors have been sought, but may not always  have been located. Such creation errors do not reflect on  the standard of medical care.

## 2022-08-07 ENCOUNTER — Encounter: Payer: Self-pay | Admitting: Internal Medicine

## 2022-08-07 ENCOUNTER — Ambulatory Visit: Payer: Medicare Other | Attending: Internal Medicine | Admitting: Internal Medicine

## 2022-08-07 VITALS — BP 139/62 | HR 61 | Resp 12 | Ht 71.0 in | Wt 143.0 lb

## 2022-08-07 DIAGNOSIS — Z79899 Other long term (current) drug therapy: Secondary | ICD-10-CM

## 2022-08-07 DIAGNOSIS — M328 Other forms of systemic lupus erythematosus: Secondary | ICD-10-CM

## 2022-08-07 DIAGNOSIS — M810 Age-related osteoporosis without current pathological fracture: Secondary | ICD-10-CM | POA: Diagnosis not present

## 2022-08-07 MED ORDER — HYDROXYCHLOROQUINE SULFATE 200 MG PO TABS
200.0000 mg | ORAL_TABLET | Freq: Every day | ORAL | 1 refills | Status: DC
Start: 2022-09-04 — End: 2023-02-07

## 2022-08-07 MED ORDER — VITAMIN D 50 MCG (2000 UT) PO CAPS
1.0000 | ORAL_CAPSULE | Freq: Every day | ORAL | Status: DC
Start: 2022-08-07 — End: 2023-11-08

## 2022-08-08 LAB — ANTI-DNA ANTIBODY, DOUBLE-STRANDED: ds DNA Ab: 14 IU/mL — ABNORMAL HIGH

## 2022-08-08 LAB — SEDIMENTATION RATE: Sed Rate: 9 mm/h (ref 0–20)

## 2022-08-08 LAB — C3 AND C4
C3 Complement: 97 mg/dL (ref 82–185)
C4 Complement: 25 mg/dL (ref 15–53)

## 2022-08-09 NOTE — Progress Notes (Signed)
dsDNA is 14 sedimentation rate and complement are normal so lupus appears to be under control no medication change needed.

## 2022-12-19 ENCOUNTER — Other Ambulatory Visit: Payer: Self-pay | Admitting: Pharmacy Technician

## 2022-12-20 ENCOUNTER — Telehealth: Payer: Self-pay

## 2022-12-20 NOTE — Telephone Encounter (Signed)
Auth Submission: APPROVED Site of care: Site of care: [facility] Payer: [employer] Medication & CPT/J Code(s) submitted: Prolia (Denosumab) [id number] Route of submission (phone, fax, portal): Fax Phone # Fax #[phone]  Auth type: Buy/Bill PB Units/visits requested: 60mg  x 2 doses Reference number: [id number] Approval from: 01/04/23 to 01/04/24

## 2023-01-06 ENCOUNTER — Encounter: Payer: Self-pay | Admitting: Internal Medicine

## 2023-01-29 ENCOUNTER — Encounter: Payer: Self-pay | Admitting: Internal Medicine

## 2023-02-02 ENCOUNTER — Telehealth: Payer: Self-pay | Admitting: Pharmacy Technician

## 2023-02-02 NOTE — Telephone Encounter (Signed)
Auth Submission: APPROVED Site of care: Site of care: [facility] Payer: [employer] Medication & CPT/J Code(s) submitted: Prolia (Denosumab) [id number] Route of submission (phone, fax, portal):  Phone # Fax # Auth type: Buy/Bill PB Units/visits requested: 2 Reference number:  Approval from: 01/04/23 to 01/04/24

## 2023-02-05 ENCOUNTER — Ambulatory Visit: Payer: BC Managed Care – PPO

## 2023-02-05 VITALS — BP 165/76 | HR 78 | Temp 97.5°F | Resp 16 | Ht 71.0 in | Wt 143.0 lb

## 2023-02-05 DIAGNOSIS — S329XXS Fracture of unspecified parts of lumbosacral spine and pelvis, sequela: Secondary | ICD-10-CM

## 2023-02-05 DIAGNOSIS — M810 Age-related osteoporosis without current pathological fracture: Secondary | ICD-10-CM | POA: Diagnosis not present

## 2023-02-05 MED ORDER — DENOSUMAB 60 MG/ML ~~LOC~~ SOSY
60.0000 mg | PREFILLED_SYRINGE | Freq: Once | SUBCUTANEOUS | Status: AC
  Administered 2023-02-05: 60 mg via SUBCUTANEOUS
  Filled 2023-02-05: qty 1

## 2023-02-05 NOTE — Progress Notes (Signed)
Diagnosis: Osteoporosis  Provider:  Chilton Greathouse MD  Procedure: Injection  Prolia (Denosumab), Dose: 60 mg, Site: subcutaneous, Number of injections: 1  Post Care: Patient declined observation  Discharge: Condition: Good, Destination: Home . AVS Provided  Performed by:  Loney Hering, LPN

## 2023-02-07 ENCOUNTER — Ambulatory Visit: Payer: BC Managed Care – PPO | Attending: Internal Medicine | Admitting: Internal Medicine

## 2023-02-07 ENCOUNTER — Encounter: Payer: Self-pay | Admitting: Internal Medicine

## 2023-02-07 VITALS — BP 168/78 | HR 75 | Resp 12 | Ht 71.0 in | Wt 144.0 lb

## 2023-02-07 DIAGNOSIS — Z79899 Other long term (current) drug therapy: Secondary | ICD-10-CM | POA: Diagnosis not present

## 2023-02-07 DIAGNOSIS — M328 Other forms of systemic lupus erythematosus: Secondary | ICD-10-CM

## 2023-02-07 DIAGNOSIS — M87052 Idiopathic aseptic necrosis of left femur: Secondary | ICD-10-CM | POA: Diagnosis not present

## 2023-02-07 DIAGNOSIS — M810 Age-related osteoporosis without current pathological fracture: Secondary | ICD-10-CM

## 2023-02-07 MED ORDER — HYDROXYCHLOROQUINE SULFATE 200 MG PO TABS: 200.0000 mg | ORAL_TABLET | Freq: Every day | ORAL | 1 refills | Status: DC

## 2023-04-02 ENCOUNTER — Encounter: Payer: Self-pay | Admitting: Internal Medicine

## 2023-06-08 NOTE — Progress Notes (Signed)
 Transplant Infectious Disease Consultation Note   Patient Identification: The patient is a 68 y.o. who is being seen today as a consult at the request of the kidney transplant service for evaluation of HIV infection management in the post-transplant phase   Now 5 years years post- HOPE Act transplant, with a complicated initial 12 months in 2020/2021 thanks to abdominal surgery, colectomy, pseudomembranous colitis, C.diff, strep bacteremia and multiple falls and fractures.  Date of transplant: 04/13/2018 (Kidney)  Other Providers: Patient Care Team: Meredeth Barter as PCP - General  Subjective   History of Present Illness: The patient is a 68 y.o. old male with a h/o HOPE Act renal transplant.  5 year anniversary already passed.  HIV diagnosed in 2008, currently on ABC, DTG, 3TC who has followed for a long time through the HIV clinic at [facility]. Seen in the [name] HIV-transplant clinic for about a year and a half as he's moved through approval process.   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 Albicans on surgical cultures). Additional PMH includes SLE (ANA 1:1,280), p-ANCA pos 1:640 in 2015, HTN, DVT in the past, achalasia (s/p Botox  injections). 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   He's taking ABC / 3TC and DTG. He initially took the medicines in their separate components, although in early 2019 when compliance seemed to suddenly be an issue we converted him to Triumeq  as a single pill and he's done well ever since.  Really excellent tolerability so far, even for a relatively large tablet.   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  No covid issues. Appetite has returned and he's putting weight back on, and he thinks weight improving.  Nothing to suggest hiv activity.  Missed a  single dose of Triumeq , during time of insurance change, but otherise 100% compliant.    Robotic lapascopic inguinal hernia repair 12/2019 successful. Not sexually active for many years (10+ years).    Today: History of Present Illness The patient, with a history of kidney transplant, HIV, and joint issues, presents for a routine follow-up. He reports feeling fine and has not had any intercurrent illnesses, coughs, colds, flus, or COVIDs over the winter. He has gained weight, which was a challenge in recent months and years.  The patient's left hip stiffness comes and goes, mainly after sitting for a while, but improves with walking. It does not limit his work or mobility but causes some discomfort at night. The hip issue has been stable over the past year.  The patient has been adherent to his medications, including prednisone , tacrolimus , cholesterol medicine, finasteride , amlodipine , Prolia , Flomax , and Viagra  as needed. He has not reported any new symptoms over the past six months.  The patient is planning to retire by the end of next year. He has a niece in [place] who he can call if he needs assistance.    Microbiology data personally reviewed in [place] and pertinent for: HIV viral load < 20 copies, July 2024 CD4 June 2021 = 494 (31%) CMV / BK always negative.   Radiology data personally reviewed in [place] and pertinent for:  MRI pelvis (12/10/21) 1. 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  2. Moderate to advanced associated left hip joint degenerative  changes with moderate-sized  joint effusion.  3. No stress fracture or AVN involving the right hip.  4. Intact bony pelvis.  5. No significant intrapelvic abnormalities.     PreTxP serologies: Lab Results  Component Value Date   CMVIGG Positive (!) 04/13/2018   LABVARI Positive 07/10/2017   LABRPR Non-Reactive 08/01/2018   TOXOGIGG Non-Reactive 10/18/2018   HAVAB NonReactive  07/10/2017   HBSAG NonReactive 04/10/2019   HEPBSAB NonReactive 04/10/2019   HBCAB NonReactive 04/10/2019   MUMPSIGG >300.0 07/10/2017   RUBEIGG >300.0 07/10/2017   VCAIGG Positive (!) 07/10/2017   VCAIGM Negative 07/10/2017   EBNA Positive (!) 07/10/2017   EBVEAIGG Positive (!) 07/10/2017    Review of Systems: A 12-point review of systems was completed and was negative unless noted to be positive in the HPI as detailed above and below.    Allergies  Allergen Reactions  . Nsaids (Non-Steroidal Anti-Inflammatory Drug) Other (See Comments)    S/P Renal Transplant   Current Outpatient Medications  Medication Sig Dispense Refill  . abacavir -dolutegravir -lamiVUDine  (TRIUMEQ ) 600-50-300 mg tablet Take 1 tablet by mouth once daily 90 tablet 3  . amLODIPine  (NORVASC ) 10 MG tablet Take 1 tablet (10 mg total) by mouth once daily for 180 days 30 tablet 5  . denosumab  (PROLIA ) 60 mg/mL inj syringe every 6 (six) months    . finasteride  (PROSCAR ) 5 mg tablet Take 1 tablet (5 mg total) by mouth once daily 90 tablet 3  . predniSONE  (DELTASONE ) 5 MG tablet Take 1 tablet (5 mg total) by mouth once daily 30 tablet 11  . rosuvastatin  (CRESTOR ) 5 MG tablet Take 1 tablet (5 mg total) by mouth once daily 90 tablet 3  . sildenafiL  (VIAGRA ) 100 MG tablet Take 1 tablet PO 60 minutes prior to intercourse 20 tablet 11  . tacrolimus  (PROGRAF ) 0.5 MG capsule Take 1 capsule (0.5 mg total) by mouth every 12 (twelve) hours 180 capsule 3  . tamsulosin  (FLOMAX ) 0.4 mg capsule Take 1 capsule 30 minutes after breakfast 90 capsule 3  . folic acid  (FOLVITE ) 1 MG tablet TAKE 1 TABLET BY MOUTH EVERY DAY (Patient not taking: Reported on 06/08/2023) 90 tablet 3   No current facility-administered medications for this visit.   Past Medical History:  Diagnosis Date  . Achalasia   . Achalasia of esophagus, s/p HM and partial fundoplication 1999, botox  injection 2016, dilated esophagus on imaging. 09/05/2018  . Allergic state    . Anemia   . EBV (Epstein-Barr virus) viremia   . Encounter for blood transfusion   . End stage renal disease on dialysis (CMS/HHS-HCC)    RESOLVED since transplant  . HIV infection (CMS/HHS-HCC)   . Hypertension   . Kidney abscess 2010   s/p R nephrectomy (aplastic kidney)  . Kidney replaced by transplant (HHS-HCC) 12/03/2018  . Left inguinal hernia 12/13/2019  . Osteoporosis 03/2017  . S/P colostomy (CMS/HHS-HCC) 10/02/2018  . 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 Status post colectomy 10/02/2018    Family History  Problem Relation Age of Onset  . Dementia Mother   . Colon cancer Father   . Prostate cancer Brother   . Other Sister        Ovarian Cancer  . No Known Problems Sister   . Prostate cancer Brother   . No Known Problems Brother   . No Known Problems Brother   . No Known Problems Brother   . Anesthesia problems Neg Hx   . Pseudochol deficiency Neg Hx   .  Malignant hyperthermia Neg Hx     IMMUNIZATIONS:  Immunization History  Administered Date(s) Administered  . COVID-19 Pfizer Monovalent Vaccine 06/24/2019, 07/15/2019, 11/25/2019, 05/04/2020  . COVID-19 vaccine >12 years (Pfizer-Biontech, Bivalent) IM Injection 30 mcg/0.3mL 12/31/2020  . Covid-19 Vaccine 11mcg/0.3ml (>=28yrs)Pfizer-Biontech 01/24/2023  . DTAP/IPV/HEP B (6W-331YR) VACCINE (PEDIARIX) 08/31/2006, 10/02/2006, 02/19/2007  . Flu Vaccine IIV3, IM PF (53mo+)(Fluarix, FluLaval, Fluzone) 04/24/2016, 01/05/2017  . HEP A (>=82YR) VACCINE (HAVRIX/VAQTA) 08/23/2017, 03/21/2018  . HEP B (>=131YR) VACCINE (ENGERIX-B/RECOMBIVAX) 06/11/2015, 07/16/2015, 08/13/2015, 12/17/2015, 05/19/2016, 06/16/2016, 07/14/2016, 11/11/2016, 08/23/2017, 03/21/2018, 03/21/2018, 04/23/2018  . Hepatitis B, unspecified 08/31/2006, 10/02/2006, 02/19/2007  . Influenza IIV4, IM PF (6 mo+) (FLULAVAL/FLUZONE/FLUARIX QUAD) 01/05/2020  . Influenza IIV4, IM PF (65 Yr+) (FLUAD QUAD) 12/31/2020  . Influenza  IIV4, IM preservative 48Yr+ 01/04/2018  . Influenza Whole 01/22/2008  . Influenza, IM unspecified 02/02/2015, 02/02/2016, 01/15/2017, 01/01/2018, 12/30/2018  . Influenza, recombinant (Egg-Free) IM PF (18 Yr+) (Flublok Quad) 01/28/2019  . PNEUMOCOCCAL (PCV13) (BIRTH-82YR) VACCINE (PREVNAR 13) 02/27/2017  . PNEUMOCOCCAL (PPSV23)(>=76YRS -OR- >=2 YRS WITH RISK) VACCINE (PNEUMOVAX 23 ) 08/31/2006, 11/26/2015, 07/10/2017  . TD (>=31YR) VACCINE (TDVAX) 11/01/2004  . TDAP (>=31YR) VACCINE (ADACEL/BOOSTRIX) 07/10/2017  . Td (Adult), unspecified 11/01/2004      Social history: Place of residence: [address] [place] [address] Tobacco:  reports that he has never smoked. He has never used smokeless tobacco. Alcohol:  reports no history of alcohol use. Occupation: retired Engineer, structural and geographically relevant exposures: born in [place] , no overseas travel, no travel to highly endemic regions   Objective   Well appearing African American  Vitals:   06/08/23 0858 06/08/23 0902  BP: (!) 174/76 (!) 147/75  BP Location: Right upper arm Right upper arm  Patient Position: Sitting Sitting  BP Cuff Size: Adult Adult  Pulse: 76 72  Resp: 12   Temp: 36.3 C (97.4 F)   TempSrc: Oral   SpO2: 100%   Weight: 68.8 kg (151 lb 10.8 oz)     Wt Readings from Last 3 Encounters:  06/08/23 68.8 kg (151 lb 10.8 oz)  12/20/22 65.1 kg (143 lb 8.3 oz)  11/03/22 64.3 kg (141 lb 12.1 oz)   No apparent distress, thin, moves with some pain evident on the left leg.   HEENT: EOMs intact bilaterally; pupils react equally to light; sclera and conjunctiva clear and anicteric bilaterally; pharynx no erythema, no oral thrush; no residual mouth ulcers Pulmonary: lungs clear to auscultation bilaterally, respiration is even and unlabored Cardiovascular: S1S2 regular rate and rhythm; no murmurs, S3 or S4 heard, no edema GI/Abdomen: soft, nontender, non-distended, no masses or hepatosplenomegaly palpated; renal  transplant scar healed completely.  Colostomy closed without complications, wounds well healed.  Hernia no longer present. Skin: no rashes or abnormal skin lesions noted Musculoskeletal: muscle strength appropriate and equal bilaterally; slightly antalgic gait abnormalities.  Neuro: oriented to time, place and person; speech clear; comprehends directions; cranial nerves II-XII grossly intact Psychiatry: no depression, anxiety or evidence of thought disorder   Laboratory Review Lab Results  Component Value Date   WBC 7.5 05/14/2023   HGB 14.2 11/03/2022   HCT 42.9 05/14/2023   PLT 171 05/14/2023   Lab Results  Component Value Date   NA 141 11/03/2022   K 3.7 11/03/2022   CL 108 11/03/2022   CREATININE 2.2 (H) 11/03/2022   BUN 17 11/03/2022   CO2 24 11/03/2022   ALB 3.7 11/03/2022   ALT 11 (L) 04/15/2021   AST 14 (L)  04/15/2021   INR 1.2 (H) 01/01/2020   Lab Results  Component Value Date   TRIG 124 04/15/2021   HDL 52 04/15/2021   TSH 5.19 10/02/2018     Assessment & Plan   The patient is a 68 y.o. who is being seen today as an ongoing consult for evaluation of HIV infection management in the post-transplant phase  Assessment & Plan HIV infection HIV viral load undetectable, effective management confirmed. - Continue current antiretroviral therapy regimen. - Ensure timely medication refills through mail order [facility]. -CD4 and viral loads done recently, have been consistently well controlled. CD4 today. - No sexual partners or concerns for STDs in recent months, so no STD screening oday.  Chronic kidney disease post-transplant Creatinine stable at 2.2 mg/dL, no proteinuria or infection. Weight gain may affect creatinine. Blood pressure slightly elevated. - Discuss with kidney team about potential addition of a low-dose second antihypertensive medication for better blood pressure control. - Monitor kidney function and creatinine levels regularly.  Hypertension Blood  pressure slightly elevated, potential for better control with additional medication. - Discuss with kidney team about starting a low-dose second antihypertensive medication. - maxed on 10mg  daily amlodipine , another ACE might be useful. - Monitor blood pressure regularly.  Hip joint pain Chronic left hip pain stable, no surgical intervention needed currently. - Continue monitoring symptoms and avoid surgical intervention unless necessary.  Medication management Managing medications well with 90-day supplies, no access or insurance issues. - Ensure continued access to medications and address any insurance or financial issues promptly.  Vaccination status Vaccinations up to date, no immediate need for additional vaccines. - Monitor for any new vaccine recommendations.  Follow-up He appears well, plans to retire in a year, which may impact future care needs. - Coordinate with kidney team regarding blood pressure management. - Ensure he has contact information for assistance if needed. - Discuss long-term plans for hip management and potential need for assistance post-retirement.   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  Good to see him getting better BPH improvement on flomax , that should continue.  Longer term: Review for HOPE act in 6 months. Colonoscopy with adenomatous polyps last time Jan 2021; due for repeating in 5 years, so 2026. Hba1c% today MRI hip notable for combination of moderate OA and also AVN of the left side.  Nothing on the right.  Would really prefer this done by some local experts here, given the complexity of his medical comorbidities.  Additionally his osteopenia probably doesn't help, but hopefully a little weight gain and nutrition will have.  Target weight was to get back to 147-150 over the previous 6 months.  Now up to 151 which is really pleasing.  At goal.  For questions, page the IDTx Fellow or page me directly  - happy to discuss further. This note has been created using automated tools and reviewed for accuracy by CAMERON R WOLFE.    Dr Ole SAUNDERS.Waddell MBBS(Hons), MPH, FIDSA Associate Professor of Medicine Transplant Infectious Disease  Pager: [phone] Office: [phone] Fax: [phone] This note will not be shared in MyChart

## 2023-06-08 NOTE — Progress Notes (Signed)
 [name]  Post-Transplant Follow-Up   NON KIDNEY TRANSPLANT [name], MD- PCP Deterding, Lynwood, MD - Referring physician  Ole Geralds, MD - Txp ID  SITE OF VISIT [facility]   CHIEF COMPLAINT Kidney Transplant Follow-up   HISTORY OF PRESENT ILLNESS [name] is a 68 y.o.  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 difficile, hx of pancolitis, bacteremia status post end colostomy in 09/2019, recurrent UTIs, BPH, known AVN of left hip, as well as prior weight loss who returns today to clinic at about a 6 months interval.   INTERVAL HISTORY Mr. Ferrara presents to clinic today for follow up and immunosupression management.  He is here alone today.  He did see Dr. Geralds earlier today, who reported to me that he had no concerns.  His Bps were elevated here this morning to 150/80. Bps are in the high 140-150 at home as well. He is currently only on amlodipine  5mg  daily. Previous dose of 10mg  daily caused LE edema.  He denies orthopnea or PND.  No chest pain or SOB.  He otherwise has no specific complaints today.  Continues to follow with urology. Last seen 12/2022. Minimal hesitancy with initiating stream at times. Otherwise no urinary symptoms.  No GI symptoms.  No fevers or chills.  Mood is better. Sleeping ok.  Has managed to gain more weigh. Currently at 151lbs.  He is taking all his IS as prescribed. No missed doses or rationing.  . Immunosuppression: Tac  0.5mg  BID, and pred 5mg  daily. Off antimetabolite secondary to repeat infections including EBV viremia. SABRA Prophylaxis: Completed  REVIEW OF SYSTEMS A 12-point review of systems was performed, with pertinent positives and negatives detailed in the HPI.    PAST MEDICAL HISTORY Past Medical History:  Diagnosis Date  . Achalasia   . Achalasia of esophagus, s/p HM and partial fundoplication 1999, botox  injection 2016, dilated esophagus on imaging. 09/05/2018  . Allergic state   . Anemia   . EBV (Epstein-Barr virus) viremia   . Encounter for blood transfusion   . End stage renal disease on dialysis (CMS/HHS-HCC)    RESOLVED since transplant  . HIV infection (CMS/HHS-HCC)   . Hypertension   . Kidney abscess 2010   s/p R nephrectomy (aplastic kidney)  . Kidney replaced by transplant (HHS-HCC) 12/03/2018  . Left inguinal hernia 12/13/2019  . Osteoporosis 03/2017  . S/P colostomy (CMS/HHS-HCC) 10/02/2018  . 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.1   01/04/2020 - Present    S/P inguinal hernia repair Z98.890, Z87.19   01/03/2020 - Present    Diarrhea of presumed infectious origin R19.7   01/03/2020 - Present    Stage 3b chronic kidney disease (CMS/HHS-HCC) (Chronic) N18.32   12/16/2019 - Present    Left inguinal hernia K40.90   12/13/2019 - Present    Urinary tract infection without hematuria N39.0  05/24/2019 - Present    Kidney replaced by transplant (HHS-HCC) Z94.0   12/03/2018 - Present    Need for prophylactic antibiotic Z79.2   12/03/2018 - Present    Immunosuppressive management encounter following kidney transplant (HHS-HCC) Z79.899, Z94.0   12/03/2018 - Present    AKI (acute kidney injury) (CMS-HCC) N17.9   11/29/2018 - Present    Lack of appetite R63.0   11/18/2018 - Present    Low but detectable CMV viremia B25.9   11/14/2018 - Present    Hypomagnesemia E83.42   10/19/2018 - Present     Essential hypertension I10   10/08/2018 - Present    S/P colostomy (CMS/HHS-HCC) Z93.3   10/02/2018 - Present    S/P colostomy takedown Z98.890   10/02/2018 - Present    EBV detected B27.90   10/02/2018 - Present    Sepsis (CMS/HHS-HCC) A41.9   09/30/2018 - Present    Esophageal dysphagia R13.19   09/05/2018 - Present    Odynophagia, unspecified R13.10   09/05/2018 - Present    Achalasia of esophagus, s/p HM and partial fundoplication 1999, botox  injection 2016, dilated esophagus on imaging. K22.0   09/05/2018 - Present    Sleep disturbance G47.9   05/12/2018 - Present    SLE (systemic lupus erythematosus) (CMS/HHS-HCC) M32.9   04/14/2018 - Present    S/P kidney transplant (HHS-HCC) Z94.0   04/14/2018 - Present    Overview Addendum 04/14/2018 11:30 AM by Roe Lefevre, NP    Date of Transplant = HOPE ACT DDKT 04/13/2018  ABO (Donor/Recipient) = compatible Blood type: O/O DonorType: DBD Allograft type:Left kidney KDPI = 61% Donor anatomy: two arteries, on a single cuff, single renal vein and single ureter Donor Kidney Bx: 152 glomeruli and 5% Sclerosis Allograft injury/complications: None Nephroureteral stent: placed at the time of surgery  Pump #s:Flow = 102, Pressure =  29 and RI =  0.22 Pump duration =   7 hours Cold ischemia time: 13 hours 24 minutes Warm ischemia time:  26 minutes  Induction agent Thymoglobulin PRA pre-transplant = Class I 0 % and Class II 0%  CMV D/R:+/+ EBV D/R: +/+ PHS IR: yes, pre-op informed consent obtained - will require testing per program guidelines during the first 1 year post-transplant        Immunosuppressed status (CMS/HHS-HCC) D84.9   04/14/2018 - Present    Asymptomatic HIV infection (CMS/HHS-HCC) Z21   07/10/2017 - Present    RESOLVED: Adult failure to thrive R62.7   08/03/2019 - 12/13/2019    RESOLVED: Acute kidney injury (CMS-HCC) N17.9   05/24/2019 - 12/13/2019    RESOLVED: Colostomy in place (CMS/HHS-HCC) Z93.3   01/05/2019 - 12/13/2019    RESOLVED:  Drug-induced hyperkalemia E87.5, T50.905A   12/03/2018 - 12/13/2019    RESOLVED: Depressed mood R45.89   11/14/2018 - 12/13/2019    RESOLVED: On total parenteral nutrition (TPN) Z78.9   11/14/2018 - 12/03/2018    RESOLVED: Fluid collection at surgical site T88.8XXA   11/11/2018 - 12/13/2019    RESOLVED: Ileus (CMS/HHS-HCC) K56.7   10/28/2018 - 12/03/2018    RESOLVED: Abnormal CT of the abdomen R93.5   10/28/2018 - 12/22/2018    RESOLVED: Anemia in chronic illness D63.8   10/28/2018 - 12/13/2019    RESOLVED: Hyponatremia E87.1   10/27/2018 - 12/03/2018    RESOLVED: Sinus tachycardia R00.0   10/27/2018 - 12/03/2018    RESOLVED: Acute UTI N39.0   10/25/2018 - 10/27/2018    RESOLVED: Elevated WBC count D72.829  10/24/2018 - 12/03/2018    RESOLVED: Physical deconditioning R53.81   10/18/2018 - 12/13/2019    RESOLVED: Insomnia G47.00   10/15/2018 - 12/03/2018    RESOLVED: Dysphagia R13.10   10/15/2018 - 12/03/2018    RESOLVED: Thrombocytopenia (CMS-HCC) D69.6   10/12/2018 - 10/18/2018    RESOLVED: Hypernatremia E87.0   10/10/2018 - 10/18/2018    RESOLVED: Acute postoperative pain G89.18   10/06/2018 - 10/19/2018    RESOLVED: Acute kidney injury superimposed on chronic kidney disease (CMS-HCC) N17.9, N18.9   10/06/2018 - 12/22/2018    RESOLVED: Intensive care unit patient on diuretic therapy Z79.899   10/06/2018 - 10/12/2018    RESOLVED: On enteral nutrition Z78.9   10/06/2018 - 12/13/2019    RESOLVED: Severe protein-calorie malnutrition (CMS/HHS-HCC) E43   10/04/2018 - 12/13/2019    RESOLVED: Coagulopathy (CMS/HHS-HCC) D68.9   10/02/2018 - 10/14/2018    RESOLVED: Pancytopenia D63.8   10/01/2018 - 10/21/2018    RESOLVED: C. difficile colitis A04.72   09/30/2018 - 12/13/2019    RESOLVED: Streptococcal bacteremia R78.81, B95.5   09/30/2018 - 10/04/2018    RESOLVED: Acute renal injury due to sepsis (CMS/HHS-HCC) A41.9, R65.20, N17.9   09/30/2018 - 10/14/2018    RESOLVED: Pancytopenia (CMS/HHS-HCC) I38.181   09/30/2018 - 10/13/2018    RESOLVED: Volvulus of sigmoid  colon (CMS/HHS-HCC) K56.2   09/30/2018 - 12/03/2018    RESOLVED: Delirium R41.82   09/30/2018 - 12/03/2018    RESOLVED: Esophageal achalasia K22.0   04/14/2018 - 05/13/2018    RESOLVED: Prophylactic antibiotic Z79.2   04/14/2018 - 10/13/2018    RESOLVED: Status post placement of ureteral stent Z96.0   04/14/2018 - 01/05/2019    RESOLVED: Steroid-induced hyperglycemia R73.9, T38.0X5A   04/14/2018 - 04/18/2018    RESOLVED: Encounter for pre-transplant evaluation for kidney transplant Z01.818   07/10/2017 - 04/26/2018    RESOLVED: End stage renal disease on dialysis (CMS/HHS-HCC) N18.6, Z99.2   07/10/2017 - 04/14/2018    RESOLVED: Fall, sequela W19.XXXS   01/22/2017 - 04/26/2018    Overview Signed 02/27/2017  8:21 AM by Gwenith Cleotilde Patient, MD    Admission with multiple fractures at [facility], 01/22/17      RESOLVED: ESRD needing dialysis (CMS/HHS-HCC) N18.6, Z99.2   06/30/2016 - 04/26/2018    RESOLVED: Organ transplant candidate Z76.82   06/30/2016 - 04/14/2018    RESOLVED: Pre-transplant evaluation for kidney transplant Z01.818   06/30/2016 - 04/26/2018     PAST SURGICAL HISTORY Past Surgical History:  Procedure Laterality Date  . ORIF PROXIMAL TIBIAL PLATEAU FRACTURE Right 01/2017  . FRACTURE SURGERY  Oct. 2018  . TRANSPLANT KIDNEY Left 04/13/2018   Procedure: RENAL ALLOTRANSPLANTATION, IMPLANTATION OF GRAFT; WITHOUT RECIPIENT NEPHRECTOMY;  Surgeon: Jaquelyn Rosette Fanti, MD;  Location: [name];  Service: General Surgery;  Laterality: Left;  . ESOPHAGOGASTRODOUDENOSCOPY W/BIOPSY N/A 09/18/2018   Procedure: EGD with FLIP;  Surgeon: Debarah Charlie Franky Mickey., MD;  Location: [name];  Service: Gastroenterology;  Laterality: N/A;  This procedure is performed only by: Dr. Charlie Debarah, Dr. Alm Barcelona, and Dr. Sammi Parks  . ESOPHAGEAL DILATION N/A 09/18/2018   Procedure: Placement of a balloon to measure esophageal distensibility;  Surgeon: Debarah Charlie Franky Mickey., MD;  Location: [name];  Service: Gastroenterology;  Laterality: N/A;  This procedure is performed only by: Dr. Charlie Debarah, Dr. Alm Barcelona, and Dr. Sammi Parks  . COLECTOMY TOTAL ABDOMINAL W/ILEOSTOMY/ILEOPROCTOSTOMY N/A 09/30/2018   Procedure: COLECTOMY, TOTAL, ABDOMINAL, WITHOUT PROCTECTOMY; WITH ILEOSTOMY OR ILEOPROCTOSTOMY;  Surgeon: Melvina Darleene Pac, MD;  Location: [facility] OR;  Service: General Surgery;  Laterality: N/A;  . ESOPHAGOGASTRODOUDENOSCOPY W/BIOPSY N/A 11/15/2018   Procedure: EGD;  Surgeon: Debarah Charlie Franky Mickey., MD;  [facility];  Service: Gastroenterology;  Laterality: N/A;  . ESOPHAGOGASTRODOUDENOSCOPY W/BIOPSY N/A 04/23/2019   Procedure: EGD to assess healing of esophagitis;  Surgeon: Debarah Charlie Franky Mickey., MD;  Location: [name];  Service: Gastroenterology;  Laterality: N/A;  . COLONOSCOPY THROUGH STOMA N/A 04/23/2019   Procedure: COLONOSCOPY THROUGH STOMA; WITH REMOVAL OF TUMOR(S), POLYP(S), OR OTHER LESION(S) BY SNARE TECHNIQUE;  Surgeon: Debarah Charlie Franky Mickey., MD;  Location: [name];  Service: Gastroenterology;  Laterality: N/A;  . COLONOSCOPY THROUGH STOMA N/A 04/23/2019   Procedure: COLONOSCOPY THROUGH STOMA; WITH BIOPSY, SINGLE OR MULTIPLE;  Surgeon: Debarah Charlie Franky Mickey., MD;  Location: [name];  Service: Gastroenterology;  Laterality: N/A;  . SIGMOIDOSCOPY FLEXIBLE N/A 06/30/2019   Procedure: Robot SIGMOIDOSCOPY FLEXIBLE;  Surgeon: Frederik Mliss Shawnee Isom, MD;  Location[facility];  Service: General Surgery;  Laterality: N/A;  . LAPAROSCOPIC LYSIS INTESTINAL ADHESIONS ROBOTIC N/A 06/30/2019   Procedure: Possible ROBOT ASSISTED LAPAROSCOPY, SURGICAL; ENTEROLYSIS (FREEING OF INTESTINAL ADHESION) (SEPARATE PROCEDURE);  Surgeon: Frederik Mliss Shawnee Isom, MD;  Location: [facility] OR;  Service: General Surgery;  Laterality: N/A;  . ESOPHAGOGASTRODOUDENOSCOPY W/REMOVAL FOREIGN BODY N/A 08/08/2019   Procedure:  ESOPHAGOGASTRODUODENOSCOPY, FLEXIBLE, TRANSORAL; WITH REMOVAL OF FOREIGN BODY(S);  Surgeon: Elta Fonda Mt, MD;  Location: [name];  Service: Gastroenterology;  Laterality: N/A;  food in stomach removed  . arterio venous fistula    . NEPHRECTOMY     of dysplastic kidney   SOCIAL HISTORY Social History   Socioeconomic History  . Marital status: Single  Occupational History  . Occupation: remote computer  work  Tobacco Use  . Smoking status: Never  . Smokeless tobacco: Never  Vaping Use  . Vaping status: Never Used  Substance and Sexual Activity  . Alcohol use: No  . Drug use: No  . Sexual activity: Not Currently    Partners: Male    Birth control/protection: None    Comment: More than 6 years. Lives with Lamar, his partner.   Social Drivers of Health   Food Insecurity: No Food Insecurity (06/17/2019)   Hunger Vital Sign   . Worried About Programme researcher, broadcasting/film/video in the Last Year: Never true   . Ran Out of Food in the Last Year: Never true    FAMILY HISTORY Family History  Problem Relation Name Age of Onset  . Dementia Mother    . Colon cancer Father Carlin   . Prostate cancer Brother Carlin III   . Other Sister         Ovarian Cancer  . No Known Problems Sister    . Prostate cancer Brother Chesley   . No Known Problems Brother    . No Known Problems Brother    . No Known Problems Brother    . Anesthesia problems Neg Hx    . Pseudochol deficiency Neg Hx    . Malignant hyperthermia Neg Hx      CURRENT MEDICATIONS Outpatient Encounter Medications as of 06/08/2023  Medication Sig Dispense Refill  . amLODIPine  (NORVASC ) 10 MG tablet Take 1 tablet (10 mg total) by mouth once daily for 180 days 30 tablet 5  . denosumab  (PROLIA ) 60 mg/mL inj syringe every 6 (six) months    . finasteride  (PROSCAR ) 5 mg tablet  Take 1 tablet (5 mg total) by mouth once daily 90 tablet 3  . folic acid  (FOLVITE ) 1 MG tablet TAKE 1 TABLET BY MOUTH EVERY DAY (Patient not taking:  Reported on 06/08/2023) 90 tablet 3  . predniSONE  (DELTASONE ) 5 MG tablet Take 1 tablet (5 mg total) by mouth once daily 30 tablet 11  . rosuvastatin  (CRESTOR ) 5 MG tablet Take 1 tablet (5 mg total) by mouth once daily 90 tablet 3  . sildenafiL  (VIAGRA ) 100 MG tablet Take 1 tablet PO 60 minutes prior to intercourse 20 tablet 11  . tacrolimus  (PROGRAF ) 0.5 MG capsule Take 1 capsule (0.5 mg total) by mouth every 12 (twelve) hours 180 capsule 3  . tamsulosin  (FLOMAX ) 0.4 mg capsule Take 1 capsule 30 minutes after breakfast 90 capsule 3  . [DISCONTINUED] abacavir -dolutegravir -lamiVUDine  (TRIUMEQ ) 600-50-300 mg tablet Take 1 tablet by mouth once daily 90 tablet 3   No facility-administered encounter medications on file as of 06/08/2023.    ALLERGIES Allergies  Allergen Reactions  . Nsaids (Non-Steroidal Anti-Inflammatory Drug) Other (See Comments)    S/P Renal Transplant    PHYSICAL EXAM .  Constitutional     .  General Appearance: Thin. NAD.       SABRA  Vitals     . There were no vitals taken for this visit. SABRA Neurological     .  Examination: Face symmetric, speech not dysarthric, moves all four extremities . Psychiatric     .  Examination: The patient is awake, alert, and interactive     .  Mood and Affect: The mood and affect are appropriate . Head and Face                       .  Examination: Normocephalic and atraumatic . Eyes     .  Examination: Sclera anicteric and conjunctiva clear . Ears, Nose, Mouth, and Throat     . Examination: Oropharynx clear, moist mucous membranes, teeth in good repair, no oral ulcers or thrush . Neck     .  General Examination: The neck is supple, no cervical lymphadenopathy     .  Examination of Jugular Veins: No JVD present . Respiratory .  Respiratory Effort: Normal work of breathing with no respiratory effort     .  Auscultation of Lungs: Full and clear breath sounds bilaterally . Cardiovascular     .  Auscultation of Heart: Regular rhythm, no  murmur, rub or gallop     . Gastrointestinal     .  Examination of Abdomen: Soft, non-tender, non-distended, with normoactive bowel sounds. No tendernss over RLQ kidney allograft.  . Lymphatic     .  Palpation: No cervical adenopathy . Musculoskeletal     .  Examination: Normal bulk and tone     .  Gait: The patient is able to stand easily. No assistance was needed on or off the exam table.  . Extremities     .  Examination: No pretibial, flank, or sacral edema . Skin     .  Inspection: Warm, well-perfused for areas of exposed skin   CONSULTATION AND COLLABORATION I have trended the patient's SCr, hg, WBC, anti-rejection levels (if available), lipid panel, iPTH, 25 vitamin D , urinalysis, spot protein to creatinine, CMV & BK screens.  I have reviewed the last two clinic notes. I have reviewed he immunization history, BMI, smoking history, & age appropriate cancer screening. These results have been edited in his/her electronic  record.  ASSESSMENT AND PLAN 1. Status post deceased donor kidney transplantation on 04/13/2018:  . This was a HOPE act donor, DBD, KDPI 61%, CIT 13 hours and 24 minutes, with a pump duration of 7 hours. PRA pretransplant of 0/0. Thymoglobulin induction. He has had a rather tumultulous course as outlined above, including several infectious complications including C.diff, previous weight loss.  Serum creatinine has been in the low to mid 2s, giving him clearance of mid 30s, less than 200mg /g of proteinuria on spot check, no DSA to current donor at just over 5 years from txp. Plan to get a full set of labs and see him back here in 6 months.   2. Immunosuppression: .  He was Thymoglobulin induction.    SABRA  He is only maintained on 2 immunosuppression medications secondary to his history of leukopenia, EBV viremia and C. difficile pancolitis.   No changes today.  . Goal tac level with his time out from transplant is around 5 to 6.   3. Prophylaxis:  .   He completed this  his first year transplant.  .   BK and CMV have been negative. No reason to check unless clinically inidicated.    4. EBV viremia: .   Previous EBV in the 4 digits. Prior PET CT was negative. No clinical symptoms which are worrisome at this time.   5. HIV, currently on HAART: . He is currently maintained on Triumeq .  . Continues to follow with Dr. Waddell  6. Weight loss: BMI is better at 21 today.  Will CTM.   7. HTN: Bps have been elevated recently. Could be a result of his welcomed weight gain. Currently on amlo 5mg  daily. Prior 10mg  dose caused LE edema. He is euvolemic today. Will start him on low dose carvedilol  instead. HR > 70.   Health Maintenance        Lab Results  Component Value Date   CHOLTOTAL 201 11/25/2019   LDLCALC 136 11/25/2019   HDL 35 11/25/2019   TRIG 150 11/25/2019   . BMM:  Lab Results  Component Value Date   PTH 93 (H) 12/31/2020   CALCIUM  9.9 12/31/2020   CAION 1.09 (L) 10/06/2018   PHOS 3.3 12/31/2020           Lab Results  Component Value Date   VITD 86 11/25/2019   . Immunizations:   Immunization History  Administered Date(s) Administered  . COVID-19 Booster vaccine >12 years (Pfizer-Biontech, Bivalent) IM Injection 30 mcg/0.3mL 12/31/2020  . COVID-19 Pfizer vaccine 06/24/2019, 07/15/2019, 11/25/2019, 05/04/2020  . DTaP / Hep B / IPV 08/31/2006, 10/02/2006, 02/19/2007  . Hepatitis A, Adult 08/23/2017, 03/21/2018  . Hepatitis B, Adult 06/11/2015, 07/16/2015, 08/13/2015, 12/17/2015, 05/19/2016, 06/16/2016, 07/14/2016, 11/11/2016, 08/23/2017, 03/21/2018, 03/21/2018, 04/23/2018  . Hepatitis B, unspecified 08/31/2006, 10/02/2006, 02/19/2007  . Influenza IIV3, IM pres-free 04/24/2016, 01/05/2017  . Influenza IIV4, IM Pres-free 65 Y/o+ 12/31/2020  . Influenza IIV4, IM Pres-free 43MO+ 01/05/2020  . Influenza IIV4, IM preservative 11Yr+ 01/04/2018  . Influenza Whole 01/22/2008  . Influenza, IM Pres-free (Flublok Quad) 01/28/2019  . Influenza, IM  unspecified 02/02/2015, 02/02/2016, 01/15/2017, 01/01/2018, 12/30/2018  . Pneumococcal Conjugate 13-Valent (Prevnar 13) 02/27/2017  . Pneumococcal Polysaccharide 23-Valent (Pneumovax-23) 08/31/2006, 11/26/2015, 07/10/2017  . Td (Adult), adsorbed 11/01/2004  . Td (Adult), unspecified 11/01/2004  . Tdap 07/10/2017  Colonscopy: done in 2021 - polyps non malignant. Needs to repeat in 5 years (2026)  INSTRUCTIONS - Labs today - We will call you if we  need to make changes to your medications based on your labs.   - START Carvedilol  6.25mg  twice a day.  - Continue to monitor your blood pressures for goal of < 130/80, and as tolerated.   - If your blood pressures drop to below 110/70 on this new drug - or if you feel dizzy/ lightheaded - please let us  know.  - Labs every 3 months, and we will see you in 6 months, or earlier if needed.    FUTURE ORDERS No orders of the defined types were placed in this encounter.   FUTURE APPOINTMENTS  Future Appointments     Date/Time Provider Department Center Visit Type   12/20/2023 12:00 PM (Arrive by 11:45 AM) [name]   12/20/2023 1:30 PM (Arrive by 1:15 PM) Mardeen Debby Faden, PA [name] RETURN VISIT       FUTURE APPOINTMENTS  Future Appointments     Date/Time Provider Department Center Visit Type   12/20/2023 12:00 PM (Arrive by 11:45 AM) [name]   12/20/2023 1:30 PM (Arrive by 1:15 PM) Mardeen Debby Faden, PA [name] RETURN VISIT        Attestation Statement:   I personally performed the service. (TP)  Goni Micky Cumber, MD

## 2023-07-07 ENCOUNTER — Encounter: Payer: Self-pay | Admitting: Internal Medicine

## 2023-07-11 ENCOUNTER — Telehealth: Payer: Self-pay | Admitting: Pharmacy Technician

## 2023-07-11 NOTE — Telephone Encounter (Signed)
 Auth Submission: APPROVED Site of care: Site of care: [facility] Payer: [employer] [place] Medication & CPT/J Code(s) submitted: Prolia (Denosumab) [id number] Route of submission (phone, fax, portal):  Phone # Fax # Auth type: Buy/Bill PB Units/visits requested: X2 DOSES 60MG  Q6 MONTHS Reference number: [id number] Approval from: 07/10/23 to 07/08/24

## 2023-07-25 NOTE — Progress Notes (Signed)
 Office Visit Note  Patient: Kenneth [name]             Date of Birth: 06/04/1955           MRN: [id number]             PCP: Zadie Herter, MD Referring: Zadie Herter, MD Visit Date: 08/07/2023   Subjective:  Follow-up   Discussed the use of AI scribe software for clinical note transcription with the patient, who gave verbal consent to proceed.  History of Present Illness   Kenneth [name] is a 68 y.o. 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    He has lupus and is currently taking Plaquenil  once daily. His lupus markers, including double-stranded DNA and complements, have been almost negative recently, indicating low disease activity. He has not experienced any major medical events or illnesses recently.  He has osteoarthritis, primarily affecting his hip. He describes the hip pain as 'bearable' and notes that it 'comes and goes'. He is not using any medications or patches for the hip pain and is not performing any specific stretches or exercises. He is currently managing the pain with Tylenol .  He has osteoporosis related to his kidney disease and long term steroid use. He is receiving Prolia  injections to manage this condition, with the most recent injection administered yesterday.   Labs reviewed 08/01/23 Creatinine 2.15 EGFR 33 Protein creatinine ratio 279 CBC normal Vitamin D  52 Urine culture positive Klebsiella pneumoniae  Previous HPI 02/07/2023 Kenneth [name] is a 68 y.o. 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  He is overall doing well without any major events or change in status.  Left hip and thigh pain still ongoing.  Most severely notices  pain after sitting for any prolonged periods and then standing back up.  Sometimes interrupts his sleep due to pain.  He was recommended to consider [name] consultation for AVN as he sees them for his transplant nephrology but so far has not proceeded with any surgical plans either locally or at their facility.  Otherwise no new skin rashes, ulcers, no peripheral swelling.  Last Prolia  injection on November 4 without complication.   Previous HPI 08/07/2022 Kenneth [name] is a 68 y.o. 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  Also for osteoporosis on Prolia  last dose on May 2.  Recent labs did show vitamin D  deficiency metabolic panel otherwise stable.  He continues having left hip pain most noticeable getting up after spending a long duration in seated position this gets somewhat better once he is up and moving.  No other joint pain complaints.  He has not experienced any visible swelling or rashes.  No significant interval infections.   Previous HPI 04/13/22 Kenneth [name] is a 68 y.o. male here for follow up for systemic lupus on hydroxychloroquine  200 mg daily he is also on Prograf  1 mg AM and 0.5 mg PM and prednisone  5 mg for maintenance of renal transplant.  Workup at our initial visit did  show some persistent elevated lab markers of inflammation including sedimentation rate and low positive double-stranded DNA.  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  Overall he has been doing well with no flareups or symptom exacerbation.   Previous HPI 11/30/21 Kenneth [name] is a 68 y.o. male here to establish care for systemic lupus.  Medical history is also significant for well-controlled HIV. Original diagnosis  was in 2015 discovered during evaluation for new onset of end-stage renal disease attributed to combination of lupus nephritis and FSGS.  He saw Dr. Henrine Logan for local rheumatology provider previously.  Initial symptoms included joint pain and swelling in multiple areas as well as severe fatigue and the kidney inflammation.  He was previously treated with hydroxychloroquine  and oral prednisone  for lupus though extrarenal disease activity did not appear to include other organ threatening involvement and was on hemodialysis shortly after diagnosis.  Joint pains have been pretty well controlled recently his worst complaint is in the left leg raise had stiffness and soreness ongoing in the past few months.  Pain in the thigh and knee lasting several minutes each morning but she describes some clicking or cracking sensation.  He was restarted on the hydroxychloroquine  suspicious of redevelopment for lupus related arthralgias symptoms are a little bit better today but has not been on the medication very long.  He does have numbness affecting the toes of his feet there is no radiation of pain down the leg no extension of numbness past the forefoot.   He had a complicated series of medical events in 2020 he underwent renal transplant immunosuppression originally CellCept  then Prograf  and prednisone  which is his current regimen.  Had complication from multiple falls resulting in a large fracture of the sacrum and fracture of the greater trochanter.  Fragility fractures attributed to chronic renal disease and long-term use of glucocorticoids.  He also developed severe infection complicated by C. difficile requiring partial colectomy and temporarily had an ostomy for managing this.  He was put on treatment with Prolia  for about the past 3 years most recent dose approximately 3 months ago.   He denies any history of Raynaud's symptoms no oral or nasal ulcers no alopecia is no photosensitive skin rashes.   Review of Systems   Constitutional:  Negative for fatigue.  HENT:  Negative for mouth sores and mouth dryness.   Eyes:  Negative for dryness.  Respiratory:  Negative for shortness of breath.   Cardiovascular:  Negative for chest pain and palpitations.  Gastrointestinal:  Negative for blood in stool, constipation and diarrhea.  Endocrine: Positive for increased urination.  Genitourinary:  Positive for involuntary urination.  Musculoskeletal:  Positive for gait problem. Negative for joint pain, joint pain, joint swelling, myalgias, muscle weakness, morning stiffness, muscle tenderness and myalgias.  Skin:  Negative for color change, rash, hair loss and sensitivity to sunlight.  Allergic/Immunologic: Negative for susceptible to infections.  Neurological:  Negative for dizziness and headaches.  Hematological:  Negative for swollen glands.  Psychiatric/Behavioral:  Positive for sleep disturbance. Negative for depressed mood. The patient is not nervous/anxious.     PMFS History:  Patient Active Problem List   Diagnosis Date Noted   High risk medication use 04/13/2022   Avascular necrosis of bone of left hip (HCC) 11/30/2021   Vitamin D  deficiency 11/30/2021   History of creation of ostomy (HCC) 01/22/2019   Volvulus of sigmoid colon S/P  colostomy(HCC) 12/31/2018   Adult failure to thrive 12/04/2018   Greater trochanter fracture (HCC) 10/23/2018   Severe sepsis (HCC) 09/29/2018   Clostridium enterocolitis 09/29/2018   ATN (acute tubular necrosis) (HCC) 09/29/2018   Ileus (HCC) 09/29/2018   Leucopenia    Current chronic use of systemic steroids 09/24/2018   History of esophageal ulcer 09/21/2018   History of renal transplant 09/19/2018   Pelvic fracture (HCC) 08/25/2018   Multiple falls 08/25/2018   Thumb fracture 08/25/2018   Closed nondisplaced zone I fracture of sacrum (HCC) 08/22/2018   Tachycardia 08/22/2018   Suprapubic pain 04/11/2018   Osteoporosis 03/15/2017   Chronic fatigue 03/11/2015    SLE (systemic lupus erythematosus) (HCC) 02/24/2015   AKI (acute kidney injury) (HCC)    Dysphagia    ESRD (end stage renal disease) (HCC)    DVT (deep venous thrombosis) (HCC) 06/22/2014   PVC's (premature ventricular contractions) 03/17/2014   Abdominal pain    Protein-calorie malnutrition, severe (HCC) 02/11/2014   Lupus nephritis (HCC)    Arthralgia of multiple sites, bilateral 02/02/2014   Bilateral low back pain without sciatica 02/01/2014   External hemorrhoids 01/20/2009   Macrocytic anemia 08/31/2006   HIV disease (HCC) 07/24/2006   Hyperlipidemia 05/31/2006   ERECTILE DYSFUNCTION 05/31/2006   INSOMNIA NOS 05/31/2006    Past Medical History:  Diagnosis Date   Achalasia    Candida infection, esophageal (HCC) 08/2010   a. 2012 - noted on EGD.   DVT (deep venous thrombosis) (HCC) ~ 04/2014   ?RLE   Dysphagia 2008   post heller myotomy/toupee fundoplication.     ESRD (end stage renal disease) on dialysis [facility])    "MWF: [facility]" (01/22/2017)   Fall 01/22/2017   mechanical fall w/multiple fractures/notes 01/22/2017   Gait instability    GERD (gastroesophageal reflux disease)    hx (01/22/2017)   Hemorrhoids, internal    History of blood transfusion 03/2014   "low counts"   History of gout    History of stomach ulcers    HIV infection (HCC) dx ~ 2009   a. 02/13/2014 CD4 = 240 - undetectable viral load.   Hyperlipidemia    Hypertension    hx (01/22/2017)   Insomnia    Lupus nephritis (HCC)    Pancytopenia (HCC)    Premature ventricular contractions    Renal abscess    SLE (systemic lupus erythematosus) (HCC) dx'd 2015    Family History  Problem Relation Age of Onset   Other Mother        s/p pacemaker - alive and well.   Hypertension Mother    Colon cancer Father        deceased   Ovarian cancer Sister    Prostate cancer Brother    Other Other        5 brothers, 3 sisters - alive and well.   Past Surgical History:  Procedure Laterality Date    BASCILIC VEIN TRANSPOSITION Left 06/29/2014   Procedure: FIRST STAGE  BASCILIC VEIN TRANSPOSITION;  Surgeon: Mayo Speck, MD;  Location: [facility] OR;  Service: Vascular;  Laterality: Left;   BASCILIC VEIN TRANSPOSITION Left 09/11/2014   Procedure: SECOND STAGE BASILIC VEIN TRANSPOSITION LEFT UPPER ARM;  Surgeon: Mayo Speck, MD;  Location: [facility] OR;  Service: Vascular;  Laterality: Left;   BOWEL DECOMPRESSION N/A 09/29/2018   Procedure: BOWEL DECOMPRESSION;  Surgeon: Jolinda Necessary, MD;  Location: [facility] ENDOSCOPY;  Service: Endoscopy;  Laterality: N/A;   COLONOSCOPY  2008   .  tortuous colon, internal hemorrhoids.  no polyps or diverticulosis.    ESOPHAGOGASTRODUODENOSCOPY  08/2010   Dr Denece Finger.  08/2010 candida esophagitis, no obvious esophageal stricture. 02/2006 and 05/2006 dilated esophagus, narrowed distal esophagus but no stricture, was empirically Savary dilated   ESOPHAGOGASTRODUODENOSCOPY N/A 06/27/2014   Procedure: ESOPHAGOGASTRODUODENOSCOPY (EGD);  Surgeon: Delilah Fend, MD;  Location[facility] ENDOSCOPY;  Service: Endoscopy;  Laterality: N/A;   ESOPHAGOGASTRODUODENOSCOPY N/A 07/02/2014   Procedure: ESOPHAGOGASTRODUODENOSCOPY (EGD);  Surgeon: Delilah Fend, MD;  Location: [facility] ENDOSCOPY;  Service: Endoscopy;  Laterality: N/A;  with botox    FLEXIBLE SIGMOIDOSCOPY N/A 09/29/2018   Procedure: FLEXIBLE SIGMOIDOSCOPY;  Surgeon: Jolinda Necessary, MD;  Location: [facility] ENDOSCOPY;  Service: Endoscopy;  Laterality: N/A;   HELLER MYOTOMY  1999   with toupee fundoplication of HH.    HERNIA REPAIR     INSERTION OF DIALYSIS CATHETER Right 06/29/2014   Procedure: INSERTION OF DIALYSIS CATHETER RIGHT IJ;  Surgeon: Mayo Speck, MD;  Location: [facility] OR;  Service: Vascular;  Laterality: Right;   KIDNEY TRANSPLANT     January 2020 @ [facility]   NEPHRECTOMY Right ~ 2011   ORIF TIBIA PLATEAU Right 01/23/2017   Procedure: OPEN REDUCTION INTERNAL FIXATION (ORIF) TIBIAL PLATEAU; REPAIR OF LATERAL TIBIAL PLATEAU; ARTHROTOMY WITH MENISCUS REPAIR;  ANTERIOR COMPARTMENT FASCIOTOMY;  Surgeon: Hardy Lia, MD;  Location: [facility] OR;  Service: Orthopedics;  Laterality: Right;   Social History   Social History Narrative   Lives in [place] by himself. Does not routinely exercise.   No caffeine   Immunization History  Administered Date(s) Administered   DTaP / Hep B / IPV 08/31/2006, 10/02/2006, 02/19/2007   Hepatitis A, Adult 08/23/2017, 03/21/2018   Hepatitis B 08/31/2006, 10/02/2006, 02/19/2007   Hepatitis B, ADULT 06/11/2015, 07/16/2015, 08/13/2015, 12/17/2015, 05/19/2016, 06/16/2016, 07/14/2016, 11/11/2016, 08/23/2017, 08/23/2017, 03/21/2018, 03/21/2018, 04/23/2018, 04/23/2018   Influenza Whole 01/22/2008   Influenza, Seasonal, Injecte, Preservative Fre 04/24/2016, 01/05/2017   Influenza-Unspecified 02/02/2016, 01/15/2017, 01/01/2018, 12/30/2018   PFIZER Comirnaty(Gray Top)Covid-19 Tri-Sucrose Vaccine 11/25/2019, 05/04/2020   Pfizer Covid-19 Vaccine Bivalent Booster 87yrs & up 12/31/2020   Pneumococcal Conjugate-13 02/27/2017   Pneumococcal Polysaccharide-23 08/31/2006, 11/26/2015, 07/10/2017   Td 11/01/2004   Tdap 07/10/2017     Objective: Vital Signs: BP (!) 141/65 (BP Location: Right Arm, Patient Position: Sitting, Cuff Size: Normal)   Pulse (!) 56   Resp 14   Ht 5\' 11"  (1.803 m)   Wt 151 lb (68.5 kg)   BMI 21.06 kg/m    Physical Exam Eyes:     Conjunctiva/sclera: Conjunctivae normal.  Cardiovascular:     Rate and Rhythm: Normal rate and regular rhythm.     Comments: LUE AVF palpable thrill Pulmonary:     Effort: Pulmonary effort is normal.     Breath sounds: Normal breath sounds.  Musculoskeletal:     Right lower leg: No edema.     Left lower leg: No edema.  Lymphadenopathy:     Cervical: No cervical adenopathy.  Skin:    General: Skin is warm and dry.  Neurological:     Mental Status: He is alert.  Psychiatric:        Mood and Affect: Mood normal.      Musculoskeletal Exam:  Shoulders full ROM no  tenderness or swelling Elbows full ROM no tenderness or swelling Wrists full ROM no tenderness or swelling Fingers full ROM no tenderness or swelling Left hip internal rotation range of motion slightly limited compared to left, some pain at end of range, no lateral  tenderness to pressure Knees full ROM no tenderness or swelling   Investigation: No additional findings.  Imaging: No results found.  Recent Labs: Lab Results  Component Value Date   WBC 7.2 04/13/2022   HGB 14.3 04/13/2022   PLT 214 04/13/2022   NA 142 07/27/2022   K 4.8 07/27/2022   CL 107 07/27/2022   CO2 24 07/27/2022   GLUCOSE 119 (H) 07/27/2022   BUN 24 07/27/2022   CREATININE 2.29 (H) 07/27/2022   BILITOT 0.4 07/27/2022   ALKPHOS 61 11/23/2020   AST 16 07/27/2022   ALT 17 07/27/2022   PROT 7.3 07/27/2022   ALBUMIN 3.9 11/23/2020   CALCIUM  9.2 07/27/2022   GFRAA 18 (L) 07/06/2019    Speciality Comments: PLQ Eye Exam: 05/31/2022 WNL @ [facility] Follow up in 1 year.   Patient states he has a PLQ eye exam scheduled at the end of this month (May).  Procedures:  No procedures performed Allergies: Nsaids   Assessment / Plan:     Visit Diagnoses: Other forms of systemic lupus erythematosus, unspecified organ involvement status (HCC) Well-controlled with hydroxychloroquine . Double-stranded DNA and complement levels nearly negative, indicating reduced disease activity. Informed about retinal toxicity risk with long-term hydroxychloroquine  use and importance of annual eye exams. - Continue hydroxychloroquine  200 mg daily. - Check lupus disease activity monitoring dsDNA, complement, sed rate  High risk medication use - hydroxychloroquine  200 mg daily. PLQ Eye Exam: 05/31/2022 WNL. Needs updated PLQ eye exam. Has upcoming eye exam scheduled in the next month.  Tolerating medication without any particular side effects no serious interval infections.  Recent labs reviewed including blood count and  basic metabolic panel are okay.  Avascular necrosis of bone of left hip (HCC) Osteoarthritis Affects hip with bearable intermittent symptoms. NSAIDs contraindicated due to chronic kidney disease and low-dose steroid use. Acetaminophen  used for pain management. Discussed supplements like turmeric, omega-3, glucosamine chondroitin, and MSM. Steroid injections are an option but not preferred. Encouraged hip flexibility exercises. - Continue acetaminophen  as needed for pain. - Consider supplements such as turmeric, omega-3, glucosamine chondroitin, or MSM. - Encourage hip flexibility exercises to maintain function and prevent compensatory mechanics.  Osteoporosis, unspecified osteoporosis type, unspecified pathological fracture presence - On Prolia  and supplemental vitamin D  2000 IUs daily. Secondary to chronic kidney disease, leading to renal and prednisone  use related loss of density. Prolia  used to inhibit bone resorption, with recent injection administered. Explained Prolia 's effect on increasing bone density.   Orders: No orders of the defined types were placed in this encounter.  No orders of the defined types were placed in this encounter.    Follow-Up Instructions: No follow-ups on file.   Matt Song, MD  Note - This record has been created using [employer].  Chart creation errors have been sought, but may not always  have been located. Such creation errors do not reflect on  the standard of medical care.

## 2023-07-30 ENCOUNTER — Encounter: Payer: Self-pay | Admitting: Internal Medicine

## 2023-08-06 ENCOUNTER — Encounter: Payer: Self-pay | Admitting: Internal Medicine

## 2023-08-06 ENCOUNTER — Ambulatory Visit: Payer: BC Managed Care – PPO

## 2023-08-06 VITALS — BP 170/78 | HR 81 | Temp 98.0°F | Resp 16 | Ht 71.0 in | Wt 151.4 lb

## 2023-08-06 DIAGNOSIS — S329XXS Fracture of unspecified parts of lumbosacral spine and pelvis, sequela: Secondary | ICD-10-CM | POA: Diagnosis not present

## 2023-08-06 DIAGNOSIS — M810 Age-related osteoporosis without current pathological fracture: Secondary | ICD-10-CM

## 2023-08-06 MED ORDER — DENOSUMAB 60 MG/ML ~~LOC~~ SOSY
60.0000 mg | PREFILLED_SYRINGE | Freq: Once | SUBCUTANEOUS | Status: AC
  Administered 2023-08-06: 60 mg via SUBCUTANEOUS
  Filled 2023-08-06: qty 1

## 2023-08-06 NOTE — Progress Notes (Signed)
 Diagnosis: Osteoporosis  Provider:  Chilton Greathouse MD  Procedure: Injection  Prolia (Denosumab), Dose: 60 mg, Site: subcutaneous, Number of injections: 1  Injection Site(s): Right arm  Post Care: Patient declined observation  Discharge: Condition: Good, Destination: Home . AVS Provided  Performed by:  Wyvonne Lenz, RN

## 2023-08-07 ENCOUNTER — Ambulatory Visit: Payer: BC Managed Care – PPO | Attending: Internal Medicine | Admitting: Internal Medicine

## 2023-08-07 ENCOUNTER — Encounter: Payer: Self-pay | Admitting: Internal Medicine

## 2023-08-07 VITALS — BP 143/65 | HR 57 | Resp 14 | Ht 71.0 in | Wt 151.0 lb

## 2023-08-07 DIAGNOSIS — Z7952 Long term (current) use of systemic steroids: Secondary | ICD-10-CM

## 2023-08-07 DIAGNOSIS — M87052 Idiopathic aseptic necrosis of left femur: Secondary | ICD-10-CM

## 2023-08-07 DIAGNOSIS — Z79899 Other long term (current) drug therapy: Secondary | ICD-10-CM

## 2023-08-07 DIAGNOSIS — M810 Age-related osteoporosis without current pathological fracture: Secondary | ICD-10-CM

## 2023-08-07 DIAGNOSIS — M328 Other forms of systemic lupus erythematosus: Secondary | ICD-10-CM | POA: Diagnosis not present

## 2023-08-07 MED ORDER — HYDROXYCHLOROQUINE SULFATE 200 MG PO TABS: 200.0000 mg | ORAL_TABLET | Freq: Every day | ORAL | 1 refills | Status: DC

## 2023-08-07 NOTE — Patient Instructions (Signed)
 You could consider use of supplemental treatments for osteoarthritis type pain the effectiveness may not be consistent across all individuals. Some options include turmeric, omega-3, glucosamine conjoint Rinvoq, MSM, or magnesium  supplement.   Bridge This exercise strengthens the muscles of your hip (hip extensors). Lie on your back on a firm surface with your knees bent and your feet flat on the floor. Tighten your butt muscles and lift your bottom off the floor until the trunk of your body and your hips are level with your thighs. Do not arch your back. You should feel the muscles working in your butt and the back of your thighs. If you do not feel these muscles, slide your feet 1-2 inches (2.5-5 cm) farther away from your butt. Hold this position for __________ seconds. Slowly lower your hips to the starting position. Let your muscles relax completely between repetitions. Repeat __________ times. Complete this exercise __________ times a day. Straight leg raises, side-lying This exercise strengthens the muscles that move the hip joint away from the center of the body (hip abductors). Lie on your side with your left / right leg in the top position. Lie so your head, shoulder, hip, and knee line up. You may bend your bottom knee slightly to help you balance. Roll your hips slightly forward, so your hips are stacked directly over each other and your left / right knee is facing forward. Leading with your heel, lift your top leg 4-6 inches (10-15 cm). You should feel the muscles in your top hip lifting. Do not let your foot drift forward. Do not let your knee roll toward the ceiling. Hold this position for __________ seconds. Slowly return to the starting position. Let your muscles relax completely between repetitions. Repeat __________ times. Complete this exercise __________ times a day. Straight leg raises, side-lying This exercise strengthens the muscles that move the hip joint toward the  center of the body (hip adductors). Lie on your side with your left / right leg in the bottom position. Lie so your head, shoulder, hip, and knee line up. You may place your upper foot in front to help you balance. Roll your hips slightly forward, so your hips are stacked directly over each other and your left / right knee is facing forward. Tense the muscles in your inner thigh and lift your bottom leg 4-6 inches (10-15 cm). Hold this position for __________ seconds. Slowly return to the starting position. Let your muscles relax completely between repetitions. Repeat __________ times. Complete this exercise __________ times a day. Straight leg raises, supine This exercise strengthens the muscles in the front of your thigh (quadriceps and hip flexors). Lie on your back (supine position) with your left / right leg extended and your other knee bent. Tense the muscles in the front of your left / right thigh. You should see your kneecap slide up or see increased dimpling just above your knee. Keep these muscles tight as you raise your leg 4-6 inches (10-15 cm) off the floor. Do not let your knee bend. Hold this position for __________ seconds. Keep these muscles tense as you lower your leg. Relax the muscles slowly and completely between repetitions. Repeat __________ times. Complete this exercise __________ times a day. Squats This exercise strengthens the muscles in the front of your thigh (quadriceps). Stand in front of a table, or stand in a doorframe so your feet and knees are in line with the frame. You may place your hands on the table or frame for balance.  Slowly bend your knees and lower your hips like you are going to sit in a chair. Keep your lower legs in a straight up-and-down position. Do not let your hips go lower than your knees. Do not bend your knees lower than told by your provider. If your hip pain increases, do not bend as low. Hold this position for ___________  seconds. Slowly push with your legs to return to standing. Do not use your hands to pull yourself to standing. Repeat __________ times. Complete this exercise __________ times a day.

## 2023-08-08 LAB — C3 AND C4
C3 Complement: 111 mg/dL (ref 82–185)
C4 Complement: 22 mg/dL (ref 15–53)

## 2023-08-08 LAB — ANTI-DNA ANTIBODY, DOUBLE-STRANDED: ds DNA Ab: 17 [IU]/mL — ABNORMAL HIGH

## 2023-08-08 LAB — SEDIMENTATION RATE: Sed Rate: 29 mm/h — ABNORMAL HIGH (ref 0–20)

## 2023-11-07 ENCOUNTER — Encounter: Payer: Self-pay | Admitting: Internal Medicine

## 2023-11-07 ENCOUNTER — Other Ambulatory Visit: Payer: Self-pay

## 2023-11-07 ENCOUNTER — Encounter (HOSPITAL_COMMUNITY): Payer: Self-pay | Admitting: Emergency Medicine

## 2023-11-07 ENCOUNTER — Emergency Department (HOSPITAL_COMMUNITY)

## 2023-11-07 ENCOUNTER — Inpatient Hospital Stay (HOSPITAL_COMMUNITY)
Admission: EM | Admit: 2023-11-07 | Discharge: 2023-11-10 | DRG: 554 | Disposition: A | Discharge status: Home / Self Care | Attending: Internal Medicine | Admitting: Internal Medicine

## 2023-11-07 DIAGNOSIS — Z8041 Family history of malignant neoplasm of ovary: Secondary | ICD-10-CM

## 2023-11-07 DIAGNOSIS — Z886 Allergy status to analgesic agent status: Secondary | ICD-10-CM

## 2023-11-07 DIAGNOSIS — M7989 Other specified soft tissue disorders: Secondary | ICD-10-CM

## 2023-11-07 DIAGNOSIS — Z933 Colostomy status: Secondary | ICD-10-CM

## 2023-11-07 DIAGNOSIS — Z800 Family history of malignant neoplasm of digestive organs: Secondary | ICD-10-CM

## 2023-11-07 DIAGNOSIS — Z940 Kidney transplant status: Secondary | ICD-10-CM

## 2023-11-07 DIAGNOSIS — M87852 Other osteonecrosis, left femur: Secondary | ICD-10-CM | POA: Diagnosis present

## 2023-11-07 DIAGNOSIS — Z8249 Family history of ischemic heart disease and other diseases of the circulatory system: Secondary | ICD-10-CM | POA: Diagnosis not present

## 2023-11-07 DIAGNOSIS — Z21 Asymptomatic human immunodeficiency virus [HIV] infection status: Secondary | ICD-10-CM | POA: Diagnosis present

## 2023-11-07 DIAGNOSIS — Z79620 Long term (current) use of immunosuppressive biologic: Secondary | ICD-10-CM

## 2023-11-07 DIAGNOSIS — M25461 Effusion, right knee: Secondary | ICD-10-CM | POA: Diagnosis present

## 2023-11-07 DIAGNOSIS — E785 Hyperlipidemia, unspecified: Secondary | ICD-10-CM | POA: Diagnosis present

## 2023-11-07 DIAGNOSIS — M13161 Monoarthritis, not elsewhere classified, right knee: Principal | ICD-10-CM | POA: Diagnosis present

## 2023-11-07 DIAGNOSIS — Z87440 Personal history of urinary (tract) infections: Secondary | ICD-10-CM

## 2023-11-07 DIAGNOSIS — N1832 Chronic kidney disease, stage 3b: Secondary | ICD-10-CM | POA: Diagnosis present

## 2023-11-07 DIAGNOSIS — Z7952 Long term (current) use of systemic steroids: Secondary | ICD-10-CM | POA: Diagnosis not present

## 2023-11-07 DIAGNOSIS — M3214 Glomerular disease in systemic lupus erythematosus: Secondary | ICD-10-CM | POA: Diagnosis present

## 2023-11-07 DIAGNOSIS — M109 Gout, unspecified: Secondary | ICD-10-CM | POA: Diagnosis present

## 2023-11-07 DIAGNOSIS — I129 Hypertensive chronic kidney disease with stage 1 through stage 4 chronic kidney disease, or unspecified chronic kidney disease: Secondary | ICD-10-CM | POA: Diagnosis present

## 2023-11-07 DIAGNOSIS — N400 Enlarged prostate without lower urinary tract symptoms: Secondary | ICD-10-CM | POA: Diagnosis present

## 2023-11-07 DIAGNOSIS — Z905 Acquired absence of kidney: Secondary | ICD-10-CM | POA: Diagnosis not present

## 2023-11-07 DIAGNOSIS — M1711 Unilateral primary osteoarthritis, right knee: Principal | ICD-10-CM

## 2023-11-07 DIAGNOSIS — Z7960 Long term (current) use of unspecified immunomodulators and immunosuppressants: Secondary | ICD-10-CM

## 2023-11-07 DIAGNOSIS — M25561 Pain in right knee: Secondary | ICD-10-CM | POA: Diagnosis present

## 2023-11-07 DIAGNOSIS — Z8711 Personal history of peptic ulcer disease: Secondary | ICD-10-CM

## 2023-11-07 DIAGNOSIS — Z8042 Family history of malignant neoplasm of prostate: Secondary | ICD-10-CM

## 2023-11-07 DIAGNOSIS — Z79899 Other long term (current) drug therapy: Secondary | ICD-10-CM

## 2023-11-07 LAB — I-STAT CHEM 8, ED
BUN: 20 mg/dL (ref 8–23)
Calcium, Ion: 1.09 mmol/L — ABNORMAL LOW (ref 1.15–1.40)
Chloride: 106 mmol/L (ref 98–111)
Creatinine, Ser: 2.2 mg/dL — ABNORMAL HIGH (ref 0.61–1.24)
Glucose, Bld: 98 mg/dL (ref 70–99)
HCT: 39 % (ref 39.0–52.0)
Hemoglobin: 13.3 g/dL (ref 13.0–17.0)
Potassium: 4 mmol/L (ref 3.5–5.1)
Sodium: 139 mmol/L (ref 135–145)
TCO2: 25 mmol/L (ref 22–32)

## 2023-11-07 LAB — CBC WITH DIFFERENTIAL/PLATELET
Abs Immature Granulocytes: 0.04 K/uL (ref 0.00–0.07)
Basophils Absolute: 0 K/uL (ref 0.0–0.1)
Basophils Relative: 0 %
Eosinophils Absolute: 0 K/uL (ref 0.0–0.5)
Eosinophils Relative: 1 %
HCT: 44 % (ref 39.0–52.0)
Hemoglobin: 14.4 g/dL (ref 13.0–17.0)
Immature Granulocytes: 1 %
Lymphocytes Relative: 19 %
Lymphs Abs: 1.6 K/uL (ref 0.7–4.0)
MCH: 34.1 pg — ABNORMAL HIGH (ref 26.0–34.0)
MCHC: 32.7 g/dL (ref 30.0–36.0)
MCV: 104.3 fL — ABNORMAL HIGH (ref 80.0–100.0)
Monocytes Absolute: 1.3 K/uL — ABNORMAL HIGH (ref 0.1–1.0)
Monocytes Relative: 16 %
Neutro Abs: 5.3 K/uL (ref 1.7–7.7)
Neutrophils Relative %: 63 %
Platelets: 146 K/uL — ABNORMAL LOW (ref 150–400)
RBC: 4.22 MIL/uL (ref 4.22–5.81)
RDW: 13.6 % (ref 11.5–15.5)
WBC: 8.3 K/uL (ref 4.0–10.5)
nRBC: 0 % (ref 0.0–0.2)

## 2023-11-07 LAB — COMPREHENSIVE METABOLIC PANEL WITH GFR
ALT: 16 U/L (ref 0–44)
AST: 19 U/L (ref 15–41)
Albumin: 4 g/dL (ref 3.5–5.0)
Alkaline Phosphatase: 51 U/L (ref 38–126)
Anion gap: 12 (ref 5–15)
BUN: 17 mg/dL (ref 8–23)
CO2: 23 mmol/L (ref 22–32)
Calcium: 9.7 mg/dL (ref 8.9–10.3)
Chloride: 104 mmol/L (ref 98–111)
Creatinine, Ser: 2.19 mg/dL — ABNORMAL HIGH (ref 0.61–1.24)
GFR, Estimated: 32 mL/min — ABNORMAL LOW (ref >60)
Glucose, Bld: 99 mg/dL (ref 70–99)
Potassium: 4 mmol/L (ref 3.5–5.1)
Sodium: 139 mmol/L (ref 135–145)
Total Bilirubin: 1.2 mg/dL (ref 0.0–1.2)
Total Protein: 8.4 g/dL — ABNORMAL HIGH (ref 6.5–8.1)

## 2023-11-07 LAB — SYNOVIAL CELL COUNT + DIFF, W/ CRYSTALS
Eosinophils-Synovial: 0 % (ref 0–1)
Lymphocytes-Synovial Fld: 0 % (ref 0–20)
Monocyte-Macrophage-Synovial Fluid: 7 % — ABNORMAL LOW (ref 50–90)
Neutrophil, Synovial: 93 % — ABNORMAL HIGH (ref 0–25)
WBC, Synovial: 47750 /mm3 — ABNORMAL HIGH (ref 0–200)

## 2023-11-07 LAB — CBG MONITORING, ED: Glucose-Capillary: 93 mg/dL (ref 70–99)

## 2023-11-07 LAB — URIC ACID: Uric Acid, Serum: 11 mg/dL — ABNORMAL HIGH (ref 3.7–8.6)

## 2023-11-07 LAB — I-STAT CG4 LACTIC ACID, ED
Lactic Acid, Venous: 1.3 mmol/L (ref 0.5–1.9)
Lactic Acid, Venous: 0.6 mmol/L (ref 0.5–1.9)

## 2023-11-07 LAB — PROTIME-INR
INR: 1.1 (ref 0.8–1.2)
Prothrombin Time: 15.3 s — ABNORMAL HIGH (ref 11.4–15.2)

## 2023-11-07 LAB — C-REACTIVE PROTEIN: CRP: 11.5 mg/dL — ABNORMAL HIGH (ref <1.0)

## 2023-11-07 LAB — SEDIMENTATION RATE: Sed Rate: 7 mm/h (ref 0–16)

## 2023-11-07 MED ORDER — SODIUM CHLORIDE 0.9 % IV SOLN
2.0000 g | Freq: Once | INTRAVENOUS | Status: AC
  Administered 2023-11-07: 2 g via INTRAVENOUS
  Filled 2023-11-07: qty 20

## 2023-11-07 MED ORDER — VANCOMYCIN HCL 1250 MG/250ML IV SOLN
1250.0000 mg | Freq: Once | INTRAVENOUS | Status: AC
  Administered 2023-11-08: 1250 mg via INTRAVENOUS
  Filled 2023-11-07: qty 250

## 2023-11-07 MED ORDER — OXYCODONE-ACETAMINOPHEN 5-325 MG PO TABS
1.0000 | ORAL_TABLET | Freq: Once | ORAL | Status: AC
  Administered 2023-11-07: 1 via ORAL
  Filled 2023-11-07: qty 1

## 2023-11-07 MED ORDER — LIDOCAINE-EPINEPHRINE (PF) 2 %-1:200000 IJ SOLN
20.0000 mL | Freq: Once | INTRAMUSCULAR | Status: AC
  Administered 2023-11-07: 20 mL via INTRADERMAL
  Filled 2023-11-07: qty 20

## 2023-11-07 MED ORDER — ONDANSETRON 4 MG PO TBDP
4.0000 mg | ORAL_TABLET | Freq: Once | ORAL | Status: AC
  Administered 2023-11-07: 4 mg via ORAL
  Filled 2023-11-07: qty 1

## 2023-11-07 NOTE — ED Triage Notes (Signed)
 Pt reports right knee pain and swelling since Sunday. Pt with low grade fever.

## 2023-11-07 NOTE — ED Provider Notes (Signed)
 [place] [facility] AT [facility] Provider Note   CSN: [id number] Arrival date & time: 11/07/23  1440     Patient presents with: Knee Pain   Kenneth [name] is a 68 y.o. male with a past medical history of HIV, renal transplant ,acute tubular necrosis, systemic lupus with history of new lupus nephritis, protein calorie malnutrition, pancytopenia, history of stomach ulcers, GERD, achalasia.  Patient is immunocompromised on tacrolimus , hydroxychloroquine , and on prednisone . Patient presents with a chief complaint of right knee swelling.  He reports that he noticed pain and swelling in his right knee 3 days ago.  He was hoping it would just get better with ice and Tylenol .  It has progressively worsened he is no longer able to bear weight on the leg at all.  He notably has a fever here in the emergency department.  He denies any history of gout.  He denies any injuries to the leg.  He denies chest pain or shortness of breath.    Knee Pain      Prior to Admission medications   Medication Sig Start Date End Date Taking? Authorizing Provider  abacavir -dolutegravir -lamiVUDine  (TRIUMEQ ) 600-50-300 MG tablet Take 1 tablet by mouth daily. Patient taking differently: Take 1 tablet by mouth daily with supper. 01/02/19   Medina-Vargas, Monina C, NP  amLODipine  (NORVASC ) 2.5 MG tablet Take 1 tablet by mouth daily. Patient not taking: Reported on 08/07/2023 04/15/21   [provider]  amLODipine  (NORVASC ) 5 MG tablet Take 1 tablet every day by oral route for 90 days. 01/28/22   [provider]  carvedilol  (COREG ) 6.25 MG tablet Take 6.25 mg by mouth 2 (two) times daily. 08/03/23   [provider]  Cholecalciferol  (VITAMIN D ) 50 MCG (2000 UT) CAPS Take 1 capsule (2,000 Units total) by mouth daily. 08/07/22   Rice, Lonni ORN, MD  denosumab  (PROLIA ) 60 MG/ML SOSY injection Inject 60 mg into the skin every 6 (six) months.    [provider]   finasteride  (PROSCAR ) 5 MG tablet Take by mouth. 07/15/22   [provider]  hydroxychloroquine  (PLAQUENIL ) 200 MG tablet Take 1 tablet (200 mg total) by mouth daily. 08/07/23   Rice, Lonni ORN, MD  pantoprazole  (PROTONIX ) 40 MG tablet Take 1 tablet (40 mg total) by mouth 2 (two) times daily. 01/01/19   Medina-Vargas, Monina C, NP  predniSONE  (DELTASONE ) 5 MG tablet Take 5 mg by mouth daily. 07/04/19   [provider]  rosuvastatin  (CRESTOR ) 5 MG tablet Take 1 tablet by mouth daily. 04/20/21   [provider]  sildenafil  (VIAGRA ) 100 MG tablet Take 1 tablet PO 60 minutes prior to intercourse 12/20/22   [provider]  tacrolimus  (PROGRAF ) 0.5 MG capsule TAKE 2 CAPSULES BY MOUTH IN THE MORNING AND TAKE 1 CAPSULE BY MOUTH IN THE EVENING. 11/29/21   [provider]  tamsulosin  (FLOMAX ) 0.4 MG CAPS capsule Take 1 capsule  PO   30 minutes after same meal each day. 06/07/21   [provider]  temazepam (RESTORIL) 7.5 MG capsule Take 7.5 mg by mouth at bedtime as needed for sleep. 08/10/20   [provider]  traZODone  (DESYREL ) 50 MG tablet TAKE 1/2 TABLET AT BEDTIME FOR 1 WEEK THEN TAKE 1 TABLET BY MOUTH EVERYDAY AT BEDTIME    [provider]    Allergies: Nsaids    Review of Systems  Updated Vital Signs BP (!) 170/67   Pulse 89   Temp (!) 100.8 F (38.2  C) (Oral)   Resp 16   Ht 5' 11 (1.803 m)   Wt 68 kg   SpO2 97%   BMI 20.91 kg/m   Physical Exam Vitals and nursing note reviewed.  Constitutional:      General: He is not in acute distress.    Appearance: He is underweight. He is not diaphoretic.  HENT:     Head: Normocephalic and atraumatic.  Eyes:     General: No scleral icterus.    Conjunctiva/sclera: Conjunctivae normal.  Cardiovascular:     Rate and Rhythm: Normal rate and regular rhythm.     Heart sounds: Normal heart sounds.  Pulmonary:     Effort: Pulmonary effort is normal. No respiratory distress.      Breath sounds: Normal breath sounds.  Abdominal:     Palpations: Abdomen is soft.     Tenderness: There is no abdominal tenderness.  Musculoskeletal:     Cervical back: Normal range of motion and neck supple.     Comments: Patient with exquisitely tender right knee, unable to range the right knee.  He has a large joint effusion noted.  There is swelling below circumferentially in the right lower extremity.  Skin:    General: Skin is warm and dry.  Neurological:     Mental Status: He is alert.  Psychiatric:        Behavior: Behavior normal.     (all labs ordered are listed, but only abnormal results are displayed) Labs Reviewed  CULTURE, BLOOD (ROUTINE X 2)  CULTURE, BLOOD (ROUTINE X 2)  COMPREHENSIVE METABOLIC PANEL WITH GFR  CBC WITH DIFFERENTIAL/PLATELET  PROTIME-INR  SEDIMENTATION RATE  C-REACTIVE PROTEIN  URIC ACID  I-STAT CG4 LACTIC ACID, ED  I-STAT CHEM 8, ED    EKG: None  Radiology: No results found.   .Joint Aspiration/Arthrocentesis  Date/Time: 11/07/2023 10:58 PM  Performed by: Arloa Chroman, PA-C Authorized by: Arloa Chroman, PA-C   Consent:    Consent obtained:  Verbal   Consent given by:  Patient   Risks discussed:  Bleeding, incomplete drainage, infection and pain Universal protocol:    Patient identity confirmed:  Arm band Location:    Location:  Knee   Knee:  R knee Anesthesia:    Anesthesia method:  Local infiltration   Local anesthetic:  Lidocaine  2% WITH epi Procedure details:    Preparation: Patient was prepped and draped in usual sterile fashion     Needle gauge:  18 G   Approach:  Superior   Aspirate characteristics:  Purulent, cloudy and yellow   Specimen collected: yes   Post-procedure details:    Dressing:  Adhesive bandage   Procedure completion:  Tolerated well, no immediate complications    Medications Ordered in the ED  lidocaine -EPINEPHrine  (XYLOCAINE  W/EPI) 2 %-1:200000 (PF) injection 20 mL (has no administration in  time range)  oxyCODONE -acetaminophen  (PERCOCET/ROXICET) 5-325 MG per tablet 1 tablet (1 tablet Oral Given 11/07/23 1457)  ondansetron  (ZOFRAN -ODT) disintegrating tablet 4 mg (4 mg Oral Given 11/07/23 1457)    Clinical Course as of 11/07/23 2251  Wed Nov 07, 2023  1549 WBC: 8.3 [AH]  1549 Platelets(!): 146 [AH]  1820 CRP(!): 11.5 [AH]  1820 Uric Acid, Serum(!): 11.0 [AH]  2001 Creatinine(!): 2.19 [AH]    Clinical Course User Index [AH] Arloa Chroman, PA-C  Medical Decision Making Amount and/or Complexity of Data Reviewed Labs: ordered. Decision-making details documented in ED Course. Radiology: ordered.  Risk Prescription drug management.   This patient presents to the ED for concern of right knee pain and swelling, this involves an extensive number of treatment options, and is a complaint that carries with it a high risk of complications and morbidity.  DDX includes , septic arthritis, gout/ pseudogout, Arthritic effusion, DVT, hemarthrosis  Co morbidities: . HIV, Renal transplant, SLE  Social Determinants of Health:   SDOH Screenings   Food Insecurity: No Food Insecurity (06/17/2019)   Received from [facility]  Housing: Unknown (06/06/2023)   Received from [facility]  Depression [phone]): Low Risk  (08/06/2023)  Tobacco Use: Low Risk  (11/07/2023)     Additional history:  {Additional history obtained from Triage nurse {External records from outside source obtained and reviewed including OP labs Last viral load June 08, 2023 No detectable viral load  Lab Tests:  I Ordered, and personally interpreted labs.  The pertinent results include:   No elevated white blood cell count, CMP with baseline and elevated serum creatinine. CRP elevated 11.5, normal lactic acid\ Awaited uric acid level Imaging Studies:  I ordered imaging studies including knee x-ray vascular ultrasound I independently visualized  and interpreted imaging which showed old ORIF and joint effusion evidence of DVT I agree with the radiologist interpretation  Cardiac Monitoring/ECG:  The patient was maintained on a cardiac monitor.  I personally viewed and interpreted the cardiac monitored which showed an underlying rhythm of:  Sinus rhythm at a rate of 65      Medicines ordered and prescription drug management:  I ordered medication including  Medications  lidocaine -EPINEPHrine  (XYLOCAINE  W/EPI) 2 %-1:200000 (PF) injection 20 mL (has no administration in time range)  oxyCODONE -acetaminophen  (PERCOCET/ROXICET) 5-325 MG per tablet 1 tablet (1 tablet Oral Given 11/07/23 1457)  ondansetron  (ZOFRAN -ODT) disintegrating tablet 4 mg (4 mg Oral Given 11/07/23 1457)   for pain control Reevaluation of the patient after these medicines showed that the patient improved I have reviewed the patients home medicines and have made adjustments as needed  \Patient here with severe knee pain.  His initial Gram stain and body fluid culture showed a few white blood cells no obvious organisms.  His cell count and differential differential currently pending.  Signout given to Dr. Emil at shift handoff.      Final diagnoses:  None    ED Discharge Orders     None          Arloa Chroman, PA-C 11/07/23 2301    Emil Share, DO 11/07/23 2313

## 2023-11-07 NOTE — Progress Notes (Signed)
 RLE venous duplex has been completed.  Preliminary results given to Abigail Harris, PA-C.   Results can be found under chart review under CV PROC. 11/07/2023 5:17 PM Dakiyah Heinke RVT, RDMS

## 2023-11-07 NOTE — ED Notes (Signed)
 Pt ambulated to restroom with crutches

## 2023-11-07 NOTE — Discharge Instructions (Addendum)
 ### Black Mold and Health     Black mold, sometimes called mildew, is a type of fungus that grows in damp indoor areas. Mold is very common and can be found in many homes, especially in places with water  damage or high humidity.      **What are the health risks of black mold?**      - For most healthy people, being around black mold does **not** cause serious health problems.      - Some people are sensitive or allergic to mold. In these individuals, mold can cause symptoms like sneezing, runny nose, coughing, or worsen asthma.[1][2][3][4][5][6]      - People with severe allergies, asthma, or weakened immune systems (such as those on chemotherapy or with certain chronic illnesses) may be at higher risk for health problems from mold exposure.[1][7][3][6]      - There is **no scientific evidence** that black mold causes toxic mold syndrome, memory loss, fatigue, or other vague symptoms in healthy people.[7][8][6]      - Mold does **not** cause autoimmune diseases or cancer, and infections from indoor mold are extremely rare except in people with very weak immune systems.[1][7][8][6]      **What symptoms can mold cause?**      - In people who are allergic or have asthma, mold can trigger:      - Sneezing, runny or stuffy nose      - Coughing or wheezing      - Itchy eyes or skin      - Asthma attacks[1][2][3][4][5][6]      - Rarely, certain types of mold can cause more serious lung problems in people with severe allergies or chronic lung disease.[1][7][3][6]      **Should mold be removed from the home?**      - Yes. Even though mold is not dangerous for most people, it can cause allergies and damage building materials.      - Mold should be cleaned up, and the source of moisture (like leaks or poor ventilation) should be fixed to prevent it from coming back.[1][3][6]      **When to seek medical advice:**      - If you have asthma or allergies and notice your symptoms get worse  at home, talk to your healthcare provider.      - If you have a weakened immune system and find mold in your home, let your doctor know.      **Key points:**      - Black mold is essentially mildew and is common in damp places.      - For most people, it does not cause serious health problems.      - People with severe allergies, asthma, or weak immune systems may be more sensitive to mold.      - There is no evidence that black mold causes toxic mold syndrome or other serious diseases in healthy people.[7][8][6]      - Mold should be cleaned up to prevent allergies and protect your home.      If you have questions or concerns about mold in your home, discuss them with your healthcare provider.      ### References  1. Medical Diagnostics for Indoor Mold Exposure. Hurra J, Heinzow B, Toma PENNER, et al. Education officer, environmental. 2017;220(2 Pt B):305-328. doi:10.1016/j.ijheh.2016.11.012. 2. Respiratory and Allergic Health Effects of Dampness, Mold, and Dampness-Related Agents: A Review of the Epidemiologic Evidence. Mendell MJ, Mirer AG, Cheung K,  Valli HERO, Douwes J. Environmental Health Perspectives. 2011;119(6):748-56. doi:10.1289/ehp.1002410. 3. Indoor Loss adjuster, chartered and Asthma Management. Matsui EC, [place], [place]. Pediatrics. 2016;138(5):e20162589. doi:10.1542/peds.2016-2589. 4. Association of Indoor Dampness and Molds With Rhinitis Risk: A Systematic Review and Meta-Analysis. [place] MS, Elin SAUNDERS, Hugg TT, [place], [place]. The Journal of Allergy and Clinical Immunology. 2013;132(5):1099-1110.e18. doi:10.1016/j.jaci.2013.07.028. 5. Indoor Mould Exposure, Asthma and Rhinitis: Findings From Systematic Reviews and Recent Longitudinal Studies. Caillaud D, Leynaert B, Ke[place]bul[place] M, [place] R. European Respiratory Review : An Official Journal of the European Respiratory Society. 2018;27(148):170137.  doi:10.1183/16000617.0137-2017. 6. Adverse Human Health Effects Associated With Molds in the Indoor Environment. Lesli BD, Jearlean RAINBOW, Gerlene FELIX Journal of Occupational and Environmental Medicine. 2003;45(5):470-8. doi:10.1097/00043764-200305000-00006. 7. Mold and Human Health: A Reality Check. Borchers AT, Tina JAYSON Camellia Barbera HERO. Clinical Reviews in Allergy & Immunology. 2017;52(3):305-322. doi:10.1007/s12016-442-833-9952-z. 8. The Myth of Mycotoxins and Mold Injury. Tina JAYSON Barbera ME. Clinical Reviews in Allergy & Immunology. 2019;57(3):449-455. doi:10.1007/s12016-019-08767-4.

## 2023-11-08 DIAGNOSIS — M13161 Monoarthritis, not elsewhere classified, right knee: Secondary | ICD-10-CM

## 2023-11-08 LAB — CBC
HCT: 36.5 % — ABNORMAL LOW (ref 39.0–52.0)
Hemoglobin: 12.4 g/dL — ABNORMAL LOW (ref 13.0–17.0)
MCH: 34.3 pg — ABNORMAL HIGH (ref 26.0–34.0)
MCHC: 34 g/dL (ref 30.0–36.0)
MCV: 101.1 fL — ABNORMAL HIGH (ref 80.0–100.0)
Platelets: 122 K/uL — ABNORMAL LOW (ref 150–400)
RBC: 3.61 MIL/uL — ABNORMAL LOW (ref 4.22–5.81)
RDW: 13.4 % (ref 11.5–15.5)
WBC: 6.8 K/uL (ref 4.0–10.5)
nRBC: 0 % (ref 0.0–0.2)

## 2023-11-08 LAB — BASIC METABOLIC PANEL WITH GFR
Anion gap: 9 (ref 5–15)
BUN: 23 mg/dL (ref 8–23)
CO2: 21 mmol/L — ABNORMAL LOW (ref 22–32)
Calcium: 8.5 mg/dL — ABNORMAL LOW (ref 8.9–10.3)
Chloride: 107 mmol/L (ref 98–111)
Creatinine, Ser: 2.15 mg/dL — ABNORMAL HIGH (ref 0.61–1.24)
GFR, Estimated: 33 mL/min — ABNORMAL LOW (ref >60)
Glucose, Bld: 108 mg/dL — ABNORMAL HIGH (ref 70–99)
Potassium: 4 mmol/L (ref 3.5–5.1)
Sodium: 137 mmol/L (ref 135–145)

## 2023-11-08 LAB — MAGNESIUM: Magnesium: 1.6 mg/dL — ABNORMAL LOW (ref 1.7–2.4)

## 2023-11-08 LAB — PHOSPHORUS: Phosphorus: 2.8 mg/dL (ref 2.5–4.6)

## 2023-11-08 MED ORDER — ACETAMINOPHEN 500 MG PO TABS: 1000.0000 mg | ORAL_TABLET | Freq: Four times a day (QID) | ORAL | Status: DC | PRN

## 2023-11-08 MED ORDER — PREDNISONE 5 MG PO TABS
5.0000 mg | ORAL_TABLET | Freq: Every day | ORAL | Status: DC
  Administered 2023-11-08 – 2023-11-10 (×3): 5 mg via ORAL
  Filled 2023-11-08 (×4): qty 1

## 2023-11-08 MED ORDER — MELATONIN 3 MG PO TABS
6.0000 mg | ORAL_TABLET | Freq: Every evening | ORAL | Status: DC | PRN
  Administered 2023-11-08 – 2023-11-09 (×3): 6 mg via ORAL
  Filled 2023-11-08 (×3): qty 2

## 2023-11-08 MED ORDER — AMLODIPINE BESYLATE 5 MG PO TABS
5.0000 mg | ORAL_TABLET | Freq: Every day | ORAL | Status: DC
Start: 2023-11-08 — End: 2023-11-10
  Administered 2023-11-08 – 2023-11-10 (×3): 5 mg via ORAL
  Filled 2023-11-08 (×3): qty 1

## 2023-11-08 MED ORDER — FINASTERIDE 5 MG PO TABS
5.0000 mg | ORAL_TABLET | Freq: Every day | ORAL | Status: DC
  Administered 2023-11-08 – 2023-11-10 (×3): 5 mg via ORAL
  Filled 2023-11-08 (×3): qty 1

## 2023-11-08 MED ORDER — POLYETHYLENE GLYCOL 3350 17 G PO PACK: 17.0000 g | PACK | Freq: Every day | ORAL | Status: DC | PRN

## 2023-11-08 MED ORDER — OXYCODONE HCL 5 MG PO TABS
5.0000 mg | ORAL_TABLET | ORAL | Status: DC | PRN
  Administered 2023-11-08: 5 mg via ORAL
  Filled 2023-11-08 (×2): qty 1

## 2023-11-08 MED ORDER — TAMSULOSIN HCL 0.4 MG PO CAPS
0.4000 mg | ORAL_CAPSULE | Freq: Every day | ORAL | Status: DC
  Administered 2023-11-08 – 2023-11-09 (×2): 0.4 mg via ORAL
  Filled 2023-11-08 (×2): qty 1

## 2023-11-08 MED ORDER — ABACAVIR-DOLUTEGRAVIR-LAMIVUD 600-50-300 MG PO TABS: 1.0000 | ORAL_TABLET | Freq: Every day | ORAL | Status: DC

## 2023-11-08 MED ORDER — VANCOMYCIN HCL 750 MG/150ML IV SOLN
750.0000 mg | INTRAVENOUS | Status: DC
  Administered 2023-11-08: 750 mg via INTRAVENOUS
  Filled 2023-11-08: qty 150

## 2023-11-08 MED ORDER — SODIUM CHLORIDE 0.9 % IV SOLN
2.0000 g | Freq: Two times a day (BID) | INTRAVENOUS | Status: DC
  Administered 2023-11-08 – 2023-11-09 (×3): 2 g via INTRAVENOUS
  Filled 2023-11-08 (×3): qty 12.5

## 2023-11-08 MED ORDER — CARVEDILOL 3.125 MG PO TABS: 6.2500 mg | ORAL_TABLET | Freq: Two times a day (BID) | ORAL | Status: DC

## 2023-11-08 MED ORDER — ALBUTEROL SULFATE (2.5 MG/3ML) 0.083% IN NEBU: 2.5000 mg | INHALATION_SOLUTION | RESPIRATORY_TRACT | Status: DC | PRN

## 2023-11-08 MED ORDER — CARVEDILOL 6.25 MG PO TABS
6.2500 mg | ORAL_TABLET | Freq: Two times a day (BID) | ORAL | Status: DC
  Administered 2023-11-08 – 2023-11-10 (×5): 6.25 mg via ORAL
  Filled 2023-11-08 (×5): qty 1

## 2023-11-08 MED ORDER — OXYCODONE HCL 5 MG PO TABS: 2.5000 mg | ORAL_TABLET | ORAL | Status: DC | PRN

## 2023-11-08 MED ORDER — ONDANSETRON HCL 4 MG/2ML IJ SOLN: 4.0000 mg | Freq: Four times a day (QID) | INTRAMUSCULAR | Status: DC | PRN

## 2023-11-08 MED ORDER — ABACAVIR-DOLUTEGRAVIR-LAMIVUD 600-50-300 MG PO TABS
1.0000 | ORAL_TABLET | Freq: Every day | ORAL | Status: DC
  Administered 2023-11-08 – 2023-11-09 (×3): 1 via ORAL
  Filled 2023-11-08 (×4): qty 1

## 2023-11-08 MED ORDER — MAGNESIUM SULFATE 2 GM/50ML IV SOLN
2.0000 g | Freq: Once | INTRAVENOUS | Status: AC
  Administered 2023-11-08: 2 g via INTRAVENOUS
  Filled 2023-11-08: qty 50

## 2023-11-08 MED ORDER — TACROLIMUS 0.5 MG PO CAPS
0.5000 mg | ORAL_CAPSULE | Freq: Two times a day (BID) | ORAL | Status: DC
  Administered 2023-11-08 – 2023-11-10 (×6): 0.5 mg via ORAL
  Filled 2023-11-08 (×8): qty 1

## 2023-11-08 MED ORDER — ROSUVASTATIN CALCIUM 5 MG PO TABS
5.0000 mg | ORAL_TABLET | Freq: Every day | ORAL | Status: DC
Start: 2023-11-08 — End: 2023-11-10
  Administered 2023-11-08 – 2023-11-10 (×3): 5 mg via ORAL
  Filled 2023-11-08 (×3): qty 1

## 2023-11-08 MED ORDER — HYDROXYCHLOROQUINE SULFATE 200 MG PO TABS
200.0000 mg | ORAL_TABLET | Freq: Every day | ORAL | Status: DC
  Administered 2023-11-08 – 2023-11-10 (×3): 200 mg via ORAL
  Filled 2023-11-08 (×3): qty 1

## 2023-11-08 MED ORDER — COLCHICINE 0.6 MG PO TABS
0.6000 mg | ORAL_TABLET | Freq: Every day | ORAL | Status: DC
  Administered 2023-11-08 – 2023-11-10 (×3): 0.6 mg via ORAL
  Filled 2023-11-08 (×3): qty 1

## 2023-11-08 NOTE — Progress Notes (Signed)
 Pharmacy Antibiotic Note  Kenneth [name] is a 68 y.o. male admitted on 11/07/2023 with ?septic arthritis.  Pharmacy has been consulted for Vancomycin  dosing. WBC WNL. Noted renal dysfunction.   Plan: Vancomycin  750 mg IV q24h >>>Estimated AUC: 456 Cefepime  per MD Trend WBC, temp, renal function  F/U infectious work-up Drug levels as indicated   Height: 5' 11 (180.3 cm) Weight: 68 kg (149 lb 14.6 oz) IBW/kg (Calculated) : 75.3  Temp (24hrs), Avg:99.3 F (37.4 C), Min:98 F (36.7 C), Max:100.8 F (38.2 C)  Recent Labs  Lab 11/07/23 1454 11/07/23 1513 11/07/23 1610 11/07/23 1654  WBC 8.3  --   --   --   CREATININE 2.19*  --  2.20*  --   LATICACIDVEN  --  1.3  --  0.6    Estimated Creatinine Clearance: 31.8 mL/min (A) (by C-G formula based on SCr of 2.2 mg/dL (H)).    Allergies  Allergen Reactions   Nsaids     Other reaction(s): Unknown MD said not to take    Lynwood Mckusick, PharmD, BCPS Clinical Pharmacist Phone: [phone]

## 2023-11-08 NOTE — H&P (Signed)
 History and Physical    Kenneth [name] FMW:[id number] DOB: [birth year] DOA: 11/07/2023  PCP: Meredeth Prentice MALVA, MD   Patient coming from: Home   Chief Complaint:  Chief Complaint  Patient presents with   Knee Pain    HPI:  Kenneth [name] is a 68 y.o. 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 difficile, hx of pancolitis, bacteremia status post end colostomy in 09/2019), recurrent UTI, BPH, AVN L hip, gout, who presents with acute painful R knee. Reports onset of symptoms on Sunday 8/3 with pain mainly. Then progressive swelling in the joint and inability to bear weight. Now using crutches. He has no hardware in that knee. Denies recent fever/ chills. Does have hx gout not on maintenance therapy, never had involvement in the knee, remotely did have involvement in the foot. No recent change to immunosuppressive medications.   Review of Systems:  ROS complete and negative except as marked above   Allergies  Allergen Reactions   Nsaids     Other reaction(s): Unknown MD said not to take    Prior to Admission medications   Medication Sig Start Date End Date Taking? Authorizing Provider  abacavir -dolutegravir -lamiVUDine  (TRIUMEQ ) 600-50-300 MG tablet Take 1 tablet by mouth daily. Patient taking differently: Take 1 tablet by mouth daily with supper. 01/02/19   Medina-Vargas, Monina C, NP  amLODipine  (NORVASC ) 2.5 MG tablet Take 1 tablet by mouth daily. Patient not taking: Reported on 08/07/2023 04/15/21   [provider]  amLODipine  (NORVASC ) 5 MG tablet Take 1 tablet every day by oral route for 90 days. 01/28/22   [provider]  carvedilol  (COREG ) 6.25 MG tablet Take 6.25 mg by mouth 2 (two) times daily. 08/03/23   [provider]  Cholecalciferol  (VITAMIN D ) 50 MCG  (2000 UT) CAPS Take 1 capsule (2,000 Units total) by mouth daily. 08/07/22   Rice, Lonni ORN, MD  denosumab  (PROLIA ) 60 MG/ML SOSY injection Inject 60 mg into the skin every 6 (six) months.    [provider]  finasteride  (PROSCAR ) 5 MG tablet Take by mouth. 07/15/22   [provider]  hydroxychloroquine  (PLAQUENIL ) 200 MG tablet Take 1 tablet (200 mg total) by mouth daily. 08/07/23   Jeannetta Lonni ORN, MD  pantoprazole  (PROTONIX ) 40 MG tablet Take 1 tablet (40 mg total) by mouth 2 (two) times daily. 01/01/19   Medina-Vargas, Monina C, NP  predniSONE  (DELTASONE ) 5 MG tablet Take 5 mg by mouth daily. 07/04/19   [provider]  rosuvastatin  (CRESTOR ) 5 MG tablet Take 1 tablet by mouth daily. 04/20/21   [provider]  sildenafil  (VIAGRA ) 100 MG tablet Take 1 tablet PO 60 minutes prior to intercourse 12/20/22   [provider]  tacrolimus  (PROGRAF ) 0.5 MG capsule TAKE 2 CAPSULES BY MOUTH IN THE MORNING AND TAKE 1 CAPSULE BY MOUTH IN THE EVENING. 11/29/21   [provider]  tamsulosin  (FLOMAX ) 0.4 MG CAPS capsule Take 1 capsule  PO   30 minutes after same meal each day. 06/07/21   [provider]  temazepam (RESTORIL) 7.5 MG capsule Take 7.5 mg by mouth at bedtime as needed for sleep. 08/10/20   [provider]  traZODone  (DESYREL ) 50 MG tablet TAKE 1/2 TABLET AT BEDTIME FOR 1 WEEK THEN TAKE 1 TABLET BY MOUTH EVERYDAY AT BEDTIME  [provider]    Past Medical History:  Diagnosis Date   Achalasia    Candida infection, esophageal (HCC) 08/2010   a. 2012 - noted on EGD.   DVT (deep venous thrombosis) (HCC) ~ 04/2014   ?RLE   Dysphagia 2008   post heller myotomy/toupee fundoplication.     ESRD (end stage renal disease) on dialysis [facility])    MWF: [facility] (01/22/2017)   Fall 01/22/2017   mechanical fall w/multiple fractures/notes 01/22/2017   Gait instability    GERD (gastroesophageal reflux disease)    hx  (01/22/2017)   Hemorrhoids, internal    History of blood transfusion 03/2014   low counts   History of gout    History of stomach ulcers    HIV infection (HCC) dx ~ 2009   a. 02/13/2014 CD4 = 240 - undetectable viral load.   Hyperlipidemia    Hypertension    hx (01/22/2017)   Insomnia    Lupus nephritis (HCC)    Pancytopenia (HCC)    Premature ventricular contractions    Renal abscess    SLE (systemic lupus erythematosus) (HCC) dx'd 2015    Past Surgical History:  Procedure Laterality Date   BASCILIC VEIN TRANSPOSITION Left 06/29/2014   Procedure: FIRST STAGE  BASCILIC VEIN TRANSPOSITION;  Surgeon: Krystal JULIANNA Doing, MD;  Location: [facility] OR;  Service: Vascular;  Laterality: Left;   BASCILIC VEIN TRANSPOSITION Left 09/11/2014   Procedure: SECOND STAGE BASILIC VEIN TRANSPOSITION LEFT UPPER ARM;  Surgeon: Krystal JULIANNA Doing, MD;  Location: [facility] OR;  Service: Vascular;  Laterality: Left;   BOWEL DECOMPRESSION N/A 09/29/2018   Procedure: BOWEL DECOMPRESSION;  Surgeon: Celestia Agent, MD;  Location: [facility] ENDOSCOPY;  Service: Endoscopy;  Laterality: N/A;   COLONOSCOPY  2008   .  tortuous colon, internal hemorrhoids.  no polyps or diverticulosis.    ESOPHAGOGASTRODUODENOSCOPY  08/2010   Dr Celestia.  08/2010 candida esophagitis, no obvious esophageal stricture. 02/2006 and 05/2006 dilated esophagus, narrowed distal esophagus but no stricture, was empirically Savary dilated   ESOPHAGOGASTRODUODENOSCOPY N/A 06/27/2014   Procedure: ESOPHAGOGASTRODUODENOSCOPY (EGD);  Surgeon: Norleen Hint, MD;  Location: [facility] ENDOSCOPY;  Service: Endoscopy;  Laterality: N/A;   ESOPHAGOGASTRODUODENOSCOPY N/A 07/02/2014   Procedure: ESOPHAGOGASTRODUODENOSCOPY (EGD);  Surgeon: Norleen Hint, MD;  Location: [facility] ENDOSCOPY;  Service: Endoscopy;  Laterality: N/A;  with botox    FLEXIBLE SIGMOIDOSCOPY N/A 09/29/2018   Procedure: FLEXIBLE SIGMOIDOSCOPY;  Surgeon: Celestia Agent, MD;  Location: [facility] ENDOSCOPY;  Service: Endoscopy;  Laterality: N/A;    HELLER MYOTOMY  1999   with toupee fundoplication of HH.    HERNIA REPAIR     INSERTION OF DIALYSIS CATHETER Right 06/29/2014   Procedure: INSERTION OF DIALYSIS CATHETER RIGHT IJ;  Surgeon: Krystal JULIANNA Doing, MD;  Location: [facility] OR;  Service: Vascular;  Laterality: Right;   KIDNEY TRANSPLANT     January 2020 @ [facility]   NEPHRECTOMY Right ~ 2011   ORIF TIBIA PLATEAU Right 01/23/2017   Procedure: OPEN REDUCTION INTERNAL FIXATION (ORIF) TIBIAL PLATEAU; REPAIR OF LATERAL TIBIAL PLATEAU; ARTHROTOMY WITH MENISCUS REPAIR; ANTERIOR COMPARTMENT FASCIOTOMY;  Surgeon: Celena Sharper, MD;  Location: [facility] OR;  Service: Orthopedics;  Laterality: Right;     reports that he has never smoked. He has been exposed to tobacco smoke. He has never used smokeless tobacco. He reports that he does not drink alcohol and does not use drugs.  Family History  Problem Relation Age of Onset   Other Mother        s/p  pacemaker - alive and well.   Hypertension Mother    Colon cancer Father        deceased   Ovarian cancer Sister    Prostate cancer Brother    Other Other        5 brothers, 3 sisters - alive and well.     Physical Exam: Vitals:   11/07/23 1446 11/07/23 1608 11/07/23 1848 11/07/23 1925  BP: (!) 170/67 (!) 139/58 (!) 143/75   Pulse: 89 (!) 58 67   Resp: 16 19 (!) 21   Temp: (!) 100.8 F (38.2 C)  99 F (37.2 C)   TempSrc: Oral  Oral   SpO2: 97% 98% 100% 100%  Weight:      Height:        Gen: Awake, alert, NAD   CV: Regular, normal S1, S2, 1/6 SEM RUSB  Resp: Normal WOB, CTAB  Abd: Flat, normoactive, nontender MSK: There is R knee effusion with increased warmth, no obvious skin changes, and tenderness to palpation / with ROM.   Skin: No rashes or lesions to exposed skin  Neuro: Alert and interactive  Psych: euthymic, appropriate    Data review:   Labs reviewed, notable for:   UA 11.5  Cr 2.2 at b/l  Lactate 1.3 -> 0.6  WBC 8   Arthrocentesis (per ED appeared purulent), + monosodium urate  crystals, WBC 47750, Neutrophil 93%, gram stain few WBC predominantly PMN, no organisms seen, culture pending.    Micro:  Results for orders placed or performed during the hospital encounter of 11/07/23  Body fluid culture w Gram Stain     Status: None (Preliminary result)   Collection Time: 11/07/23  3:43 PM   Specimen: Synovium; Body Fluid  Result Value Ref Range Status   Specimen Description SYNOVIAL  Final   Special Requests RIGHT KNEE  Final   Gram Stain   Final    FEW WBC PRESENT, PREDOMINANTLY PMN NO ORGANISMS SEEN Performed at [facility], [address], [place], [place] [address]    Culture PENDING  Incomplete   Report Status PENDING  Incomplete    Imaging reviewed:  VAS US  LOWER EXTREMITY VENOUS (DVT) (ONLY [facility]) Result Date: 11/07/2023  Lower Venous DVT Study Patient Name:  KAMEN HANKEN  Date of Exam:   11/07/2023 Medical Rec #: [id number]         Accession #:    [id number] Date of Birth: [birth year]          Patient Gender: M Patient Age:   62 years Exam Location:  [facility] Procedure:      VAS US  LOWER EXTREMITY VENOUS (DVT) Referring Phys: ABIGAIL HARRIS --------------------------------------------------------------------------------  Indications: RLE knee pain and swelling.  Comparison Study: Previous exam on 01/28/2017 was negative for DVT Performing Technologist: Ezzie Potters RVT, RDMS  Examination Guidelines: A complete evaluation includes B-mode imaging, spectral Doppler, color Doppler, and power Doppler as needed of all accessible portions of each vessel. Bilateral testing is considered an integral part of a complete examination. Limited examinations for reoccurring indications may be performed as noted. The reflux portion of the exam is performed with the patient in reverse Trendelenburg.  +---------+---------------+---------+-----------+----------+--------------+ RIGHT    CompressibilityPhasicitySpontaneityPropertiesThrombus Aging  +---------+---------------+---------+-----------+----------+--------------+ CFV      Full           Yes      Yes                                 +---------+---------------+---------+-----------+----------+--------------+  SFJ      Full                                                        +---------+---------------+---------+-----------+----------+--------------+ FV Prox  Full           Yes      Yes                                 +---------+---------------+---------+-----------+----------+--------------+ FV Mid   Full           Yes      Yes                                 +---------+---------------+---------+-----------+----------+--------------+ FV DistalFull           Yes      Yes                                 +---------+---------------+---------+-----------+----------+--------------+ PFV      Full                                                        +---------+---------------+---------+-----------+----------+--------------+ POP      Full           Yes      Yes                                 +---------+---------------+---------+-----------+----------+--------------+ PTV      Full                                                        +---------+---------------+---------+-----------+----------+--------------+ PERO     Full                                                        +---------+---------------+---------+-----------+----------+--------------+   +----+---------------+---------+-----------+----------+--------------+ LEFTCompressibilityPhasicitySpontaneityPropertiesThrombus Aging +----+---------------+---------+-----------+----------+--------------+ CFV Full           Yes      Yes                                 +----+---------------+---------+-----------+----------+--------------+     Summary: RIGHT: - There is no evidence of deep vein thrombosis in the lower extremity.  - Ultrasound characteristics of a ruptured popliteal  cyst are noted.  LEFT: - No evidence of common femoral vein obstruction.   *See table(s) above for measurements and observations. Electronically signed by Gaile New MD on 11/07/2023 at 6:11:49 PM.    Final    DG Knee Right Port Result  Date: 11/07/2023 CLINICAL DATA:  Acute right knee pain and swelling without reported injury. EXAM: PORTABLE RIGHT KNEE - 1-2 VIEW COMPARISON:  January 23, 2017. FINDINGS: Status post surgical internal fixation of old right tibial plateau fracture. No acute fracture or dislocation is noted. Moderate suprapatellar joint effusion is noted. IMPRESSION: Moderate suprapatellar joint effusion. No acute fracture or dislocation. Electronically Signed   By: Lynwood Landy Raddle M.D.   On: 11/07/2023 16:17    EKG: Personally reviewed, SR, borderline LAD, LVH, repolarization changes anteriorly, no acute ischemic changes   ED Course:  Underwent arthrocentesis. EDP consulted with orthopedics Dr. Sherida, group to consult in the morning. Treated with Vancomycin  and Ceftriaxone  after arthrocentesis. Otherwise Treated with Oxycodone , zofran .    Assessment/Plan:  68 y.o. 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 difficile, hx of pancolitis, bacteremia status post end colostomy in 09/2019), SLE, hx DVT off AC, hx achalasia, recurrent UTI, BPH, AVN L hip, gout, who presents with acute monoarthritis of the R knee with evident gout and question of septic arthritis.   Inflammatory (gout) and possible septic arthritis - R knee  P/w Acute knee pain and swelling since 8/3 now unable to bear weight using crutches. On presentation febrile to 38.2, no other SIRS.  Arthrocentesis (per ED appeared purulent), + monosodium urate crystals, WBC 47750, Neutrophil 93%, gram stain few WBC predominantly PMN, no  organisms seen, culture pending. UA 11.5. WBC 8.  -- EDP consulted with orthopedics Dr. Sherida, group to consult in the morning. -- Treated with Vancomycin  and Ceftriaxone  after arthrocentesis.; change to Cefepime  given hx immunocompromise  -- F/u Synovial, Bcx  -- Gout management complicated with hx of renal transplant, use of tacrolimus , and concern for possible concominant infection. At this point will hold off on increase in oral glucocorticoids (takes low dose for renal transplant) pending additional culture data but these are preferred agent in his case.  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 Tac goal 5-6 per OP note. Continue Tacrolimus  0.5 mg BID, Prednisone  5 mg daily. Check tac trough in the morning.  Hx Cdiff, prior ostomy: Noted  SLE: Continue HCQ  Hx DVT: Now off Achalasia  Hx achalasia: Previously receiving botox  inj.  Hx recurrent UTI: Noted  BPH: Continue home Tamsulosin , finasteride   AVN L hip: esp limits functionality with acute R knee issue   Body mass index is 20.91 kg/m.    DVT prophylaxis:  SQ Heparin  Code Status:  Full Code Diet:  Diet Orders (From admission, onward)     Start     Ordered   11/07/23 1454  Diet NPO time specified  (Undifferentiated presentation (screening labs and basic nursing orders))  Diet effective now        11/07/23 1455           Family Communication:  None   Consults:  Orthopedics   Admission status:   Inpatient, med surg   Severity of Illness: The appropriate patient status for this patient is INPATIENT. Inpatient status is judged to be reasonable and necessary in order to provide the required intensity of service to ensure the patient's safety. The patient's presenting symptoms, physical exam findings, and initial  radiographic and laboratory data in the context of  their chronic comorbidities is felt to place them at high risk for further clinical deterioration. Furthermore, it is not anticipated that the patient will be medically stable for discharge from the hospital within 2 midnights of admission.   * I certify that at the point of admission it is my clinical judgment that the patient will require inpatient hospital care spanning beyond 2 midnights from the point of admission due to high intensity of service, high risk for further deterioration and high frequency of surveillance required.*   Dorn Dawson, MD [facility]  How to contact the [facility] Attending or Consulting provider 7A - 7P or covering provider during after hours 7P -7A, for this patient.  Check the care team in [facility] and look for a) attending/consulting [facility] provider listed and b) the [facility] team listed Log into [email or web address] and use [facility] universal password to access. If you do not have the password, please contact the hospital operator. Locate the [facility] provider you are looking for under [facility] and page to a number that you can be directly reached. If you still have difficulty reaching the provider, please page the [facility] (Director on Call) for the Hospitalists listed on amion for assistance.  11/08/2023, 12:11 AM

## 2023-11-08 NOTE — Consult Note (Signed)
 ORTHOPAEDIC CONSULTATION  REQUESTING PHYSICIAN: Tobie Yetta HERO, MD  Chief Complaint: right knee pain  HPI: Kenneth [name] is a 68 y.o. 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 difficile, hx of pancolitis, bacteremia status post end colostomy in 09/2019), recurrent UTI, BPH, AVN L hip, gout, who presents with acute painful R knee.   Reports onset of symptoms on Sunday 8/3 with pain mainly. Then progressive swelling in the joint and inability to bear weight. Now using crutches. Denies recent fever/ chills. Does have hx gout not on maintenance therapy, never had involvement in the knee, remotely did have involvement in the foot. No recent change to immunosuppressive medications. He does have a history of previous right tibia plateau fracture s/p ORIF  He had arthrocentesis performed in the ED with turbid appearing  Past Medical History:  Diagnosis Date   Achalasia    Candida infection, esophageal (HCC) 08/2010   a. 2012 - noted on EGD.   DVT (deep venous thrombosis) (HCC) ~ 04/2014   ?RLE   Dysphagia 2008   post heller myotomy/toupee fundoplication.     ESRD (end stage renal disease) on dialysis [facility])    MWF: [facility] (01/22/2017)   Fall 01/22/2017   mechanical fall w/multiple fractures/notes 01/22/2017   Gait instability    GERD (gastroesophageal reflux disease)    hx (01/22/2017)   Hemorrhoids, internal    History of blood transfusion 03/2014   low counts   History of gout    History of stomach ulcers    HIV infection (HCC) dx ~ 2009   a. 02/13/2014 CD4 = 240 - undetectable viral load.   Hyperlipidemia    Hypertension    hx (01/22/2017)   Insomnia    Lupus nephritis (HCC)    Pancytopenia (HCC)    Premature ventricular contractions    Renal abscess    SLE  (systemic lupus erythematosus) (HCC) dx'd 2015   Past Surgical History:  Procedure Laterality Date   BASCILIC VEIN TRANSPOSITION Left 06/29/2014   Procedure: FIRST STAGE  BASCILIC VEIN TRANSPOSITION;  Surgeon: Krystal JULIANNA Doing, MD;  Location: [facility] OR;  Service: Vascular;  Laterality: Left;   BASCILIC VEIN TRANSPOSITION Left 09/11/2014   Procedure: SECOND STAGE BASILIC VEIN TRANSPOSITION LEFT UPPER ARM;  Surgeon: Krystal JULIANNA Doing, MD;  Location: [facility] OR;  Service: Vascular;  Laterality: Left;   BOWEL DECOMPRESSION N/A 09/29/2018   Procedure: BOWEL DECOMPRESSION;  Surgeon: Celestia Agent, MD;  Location: [facility] ENDOSCOPY;  Service: Endoscopy;  Laterality: N/A;   COLONOSCOPY  2008   .  tortuous colon, internal hemorrhoids.  no polyps or diverticulosis.    ESOPHAGOGASTRODUODENOSCOPY  08/2010   Dr Celestia.  08/2010 candida esophagitis, no obvious esophageal stricture. 02/2006 and 05/2006 dilated esophagus, narrowed distal esophagus but no stricture, was empirically Savary dilated   ESOPHAGOGASTRODUODENOSCOPY N/A 06/27/2014   Procedure: ESOPHAGOGASTRODUODENOSCOPY (EGD);  Surgeon: Norleen Hint, MD;  Location: [facility] ENDOSCOPY;  Service: Endoscopy;  Laterality: N/A;   ESOPHAGOGASTRODUODENOSCOPY N/A 07/02/2014   Procedure: ESOPHAGOGASTRODUODENOSCOPY (EGD);  Surgeon: Norleen Hint, MD;  Location: [facility] ENDOSCOPY;  Service: Endoscopy;  Laterality: N/A;  with botox    FLEXIBLE SIGMOIDOSCOPY N/A 09/29/2018   Procedure: FLEXIBLE SIGMOIDOSCOPY;  Surgeon: Celestia Agent, MD;  Location: [facility] ENDOSCOPY;  Service: Endoscopy;  Laterality: N/A;   HELLER MYOTOMY  1999   with toupee fundoplication of  HH.    HERNIA REPAIR     INSERTION OF DIALYSIS CATHETER Right 06/29/2014   Procedure: INSERTION OF DIALYSIS CATHETER RIGHT IJ;  Surgeon: Krystal JULIANNA Doing, MD;  Location: [facility] OR;  Service: Vascular;  Laterality: Right;   KIDNEY TRANSPLANT     January 2020 @ [facility]   NEPHRECTOMY Right ~ 2011   ORIF TIBIA PLATEAU Right 01/23/2017   Procedure: OPEN REDUCTION  INTERNAL FIXATION (ORIF) TIBIAL PLATEAU; REPAIR OF LATERAL TIBIAL PLATEAU; ARTHROTOMY WITH MENISCUS REPAIR; ANTERIOR COMPARTMENT FASCIOTOMY;  Surgeon: Celena Sharper, MD;  Location: [facility] OR;  Service: Orthopedics;  Laterality: Right;   Social History   Socioeconomic History   Marital status: Single    Spouse name: Not on file   Number of children: 0   Years of education: 16   Highest education level: Bachelor's degree (e.g., BA, AB, BS)  Occupational History    Comment: IT  Tobacco Use   Smoking status: Never    Passive exposure: Past   Smokeless tobacco: Never  Vaping Use   Vaping status: Never Used  Substance and Sexual Activity   Alcohol use: Never   Drug use: Never   Sexual activity: Not Currently    Partners: Male  Other Topics Concern   Not on file  Social History Narrative   Lives in [place] by himself. Does not routinely exercise.   No caffeine   Social Drivers of Corporate investment banker Strain: Not on file  Food Insecurity: No Food Insecurity (11/08/2023)   Hunger Vital Sign    Worried About Running Out of Food in the Last Year: Never true    Ran Out of Food in the Last Year: Never true  Transportation Needs: No Transportation Needs (11/08/2023)   PRAPARE - Administrator, Civil Service (Medical): No    Lack of Transportation (Non-Medical): No  Physical Activity: Not on file  Stress: Not on file  Social Connections: Unknown (11/08/2023)   Social Connection and Isolation Panel    Frequency of Communication with Friends and Family: Three times a week    Frequency of Social Gatherings with Friends and Family: Once a week    Attends Religious Services: Patient declined    Database administrator or Organizations: Patient declined    Attends Banker Meetings: Patient declined    Marital Status: Patient declined   Family History  Problem Relation Age of Onset   Other Mother        s/p pacemaker - alive and well.   Hypertension Mother    Colon  cancer Father        deceased   Ovarian cancer Sister    Prostate cancer Brother    Other Other        5 brothers, 3 sisters - alive and well.   Allergies  Allergen Reactions   Nsaids Other (See Comments)    Told to avoid by MD   Prior to Admission medications   Medication Sig Start Date End Date Taking? Authorizing Provider  abacavir -dolutegravir -lamiVUDine  (TRIUMEQ ) 600-50-300 MG tablet Take 1 tablet by mouth daily. Patient taking differently: Take 1 tablet by mouth every evening. 01/02/19  Yes Medina-Vargas, Monina C, NP  amLODipine  (NORVASC ) 5 MG tablet Take 5 mg by mouth daily at 12 noon. 01/28/22  Yes [provider]  carvedilol  (COREG ) 6.25 MG tablet Take 6.25 mg by mouth in the morning and at bedtime. 08/03/23  Yes [provider]  Cholecalciferol  (VITAMIN D -3 PO) Take  1 tablet by mouth daily.   Yes [provider]  denosumab  (PROLIA ) 60 MG/ML SOSY injection Inject 60 mg into the skin every 6 (six) months.   Yes [provider]  finasteride  (PROSCAR ) 5 MG tablet Take 5 mg by mouth every evening. 07/15/22  Yes [provider]  hydroxychloroquine  (PLAQUENIL ) 200 MG tablet Take 1 tablet (200 mg total) by mouth daily. Patient taking differently: Take 200 mg by mouth every evening. 08/07/23  Yes Rice, Lonni ORN, MD  predniSONE  (DELTASONE ) 5 MG tablet Take 5 mg by mouth every evening. 07/04/19  Yes [provider]  rosuvastatin  (CRESTOR ) 5 MG tablet Take 5 mg by mouth every evening. 04/20/21  Yes [provider]  tacrolimus  (PROGRAF ) 0.5 MG capsule Take 0.5 mg by mouth See admin instructions. Take 1 capsule (0.5mg ) by mouth twice daily in the morning and evening. 11/29/21  Yes [provider]  tamsulosin  (FLOMAX ) 0.4 MG CAPS capsule Take 0.4 mg by mouth every evening. 06/07/21  Yes [provider]    Family History Reviewed and non-contributory, no pertinent history of problems with bleeding or anesthesia       Review of Systems 14 system ROS conducted and negative except for that noted in HPI   OBJECTIVE  Vitals:Patient Vitals for the past 8 hrs:  BP Temp Temp src Pulse Resp SpO2  11/08/23 0827 (!) 160/73 98.9 F (37.2 C) Oral 75 16 99 %   General: Alert, no acute distress Cardiovascular: Warm extremities noted Respiratory: No cyanosis, no use of accessory musculature GI: No organomegaly, abdomen is soft and non-tender Skin: No lesions in the area of chief complaint other than those listed below in MSK exam.  Neurologic: Sensation intact distally save for the below mentioned MSK exam Psychiatric: Patient is competent for consent with normal mood and affect Lymphatic: No swelling obvious and reported other than the area involved in the exam below Extremities  RLE: Small effusion Knee slightly warm to touch Knee ROM 10-60, no pain with micromotion Neurovascularly intact distally    Test Results Imaging VAS US  LOWER EXTREMITY VENOUS (DVT) (ONLY [facility]) Result Date: 11/07/2023  Lower Venous DVT Study Patient Name:  WASIF SIMONICH  Date of Exam:   11/07/2023 Medical Rec #: [id number]         Accession #:    [id number] Date of Birth: [birth year]          Patient Gender: M Patient Age:   58 years Exam Location:  [facility] Procedure:      VAS US  LOWER EXTREMITY VENOUS (DVT) Referring Phys: ABIGAIL HARRIS --------------------------------------------------------------------------------  Indications: RLE knee pain and swelling.  Comparison Study: Previous exam on 01/28/2017 was negative for DVT Performing Technologist: Ezzie Potters RVT, RDMS  Examination Guidelines: A complete evaluation includes B-mode imaging, spectral Doppler, color Doppler, and power Doppler as needed of all accessible portions of each vessel. Bilateral testing is considered an integral part of a complete examination. Limited examinations for reoccurring indications may be performed as noted. The reflux portion of the exam is  performed with the patient in reverse Trendelenburg.  +---------+---------------+---------+-----------+----------+--------------+ RIGHT    CompressibilityPhasicitySpontaneityPropertiesThrombus Aging +---------+---------------+---------+-----------+----------+--------------+ CFV      Full           Yes      Yes                                 +---------+---------------+---------+-----------+----------+--------------+  SFJ      Full                                                        +---------+---------------+---------+-----------+----------+--------------+ FV Prox  Full           Yes      Yes                                 +---------+---------------+---------+-----------+----------+--------------+ FV Mid   Full           Yes      Yes                                 +---------+---------------+---------+-----------+----------+--------------+ FV DistalFull           Yes      Yes                                 +---------+---------------+---------+-----------+----------+--------------+ PFV      Full                                                        +---------+---------------+---------+-----------+----------+--------------+ POP      Full           Yes      Yes                                 +---------+---------------+---------+-----------+----------+--------------+ PTV      Full                                                        +---------+---------------+---------+-----------+----------+--------------+ PERO     Full                                                        +---------+---------------+---------+-----------+----------+--------------+   +----+---------------+---------+-----------+----------+--------------+ LEFTCompressibilityPhasicitySpontaneityPropertiesThrombus Aging +----+---------------+---------+-----------+----------+--------------+ CFV Full           Yes      Yes                                  +----+---------------+---------+-----------+----------+--------------+     Summary: RIGHT: - There is no evidence of deep vein thrombosis in the lower extremity.  - Ultrasound characteristics of a ruptured popliteal cyst are noted.  LEFT: - No evidence of common femoral vein obstruction.   *See table(s) above for measurements and observations. Electronically signed by Gaile New MD on 11/07/2023 at 6:11:49 PM.    Final    DG Knee Right Port Result  Date: 11/07/2023 CLINICAL DATA:  Acute right knee pain and swelling without reported injury. EXAM: PORTABLE RIGHT KNEE - 1-2 VIEW COMPARISON:  January 23, 2017. FINDINGS: Status post surgical internal fixation of old right tibial plateau fracture. No acute fracture or dislocation is noted. Moderate suprapatellar joint effusion is noted. IMPRESSION: Moderate suprapatellar joint effusion. No acute fracture or dislocation. Electronically Signed   By: Lynwood Landy Raddle M.D.   On: 11/07/2023 16:17   Labs cbc Recent Labs    11/07/23 1454 11/07/23 1610 11/08/23 0546  WBC 8.3  --  6.8  HGB 14.4 13.3 12.4*  HCT 44.0 39.0 36.5*  PLT 146*  --  122*    Labs inflam Recent Labs    11/07/23 1454  CRP 11.5*    Labs coag Recent Labs    11/07/23 1454  INR 1.1    Recent Labs    11/07/23 1454 11/07/23 1610 11/08/23 0546  NA 139 139 137  K 4.0 4.0 4.0  CL 104 106 107  CO2 23  --  21*  GLUCOSE 99 98 108*  BUN 17 20 23   CREATININE 2.19* 2.20* 2.15*  CALCIUM  9.7  --  8.5*   Right knee fluid Cell count 47,750 WBCs Monosodium Urate crystals present NGTD on culture   ASSESSMENT AND PLAN: 68 y.o. male with the following: acute right knee pain and swelling: Cultures are NGTD with gout crystals present. Likely an acute gout flair. Low suspicion for septic arthritis.  Continue following culture results Can continue abx until final culture results given his immune compromised status WBAT and ROM as tolerated for right knee Pain control as  needed Rest of medical management per primary team

## 2023-11-08 NOTE — Progress Notes (Signed)
 [facility] PLAN OF CARE NOTE Patient: Kenneth [name] FMW:[id number]   PCP: Alec House, MD DOB: Nov 26, 1955   DOA: 11/07/2023   DOS: 11/08/2023    Patient was admitted by my colleague earlier on 11/08/2023. I have reviewed the H&P as well as assessment and plan and agree with the same. Important changes in the plan are listed below.  Plan of care: Principal Problem:   Monoarthritis of right knee Immunocompromised with history of HIV as well as renal transplant requiring immunosuppressive medications. Intra-articular crystals seen for gout although given his history as above, it is imperative to rule out septic arthritis. Appreciate orthopedic consultation. Will follow-up on blood cultures, synovial fluid cultures. Continue broad-spectrum antibiotic for now. Will initiate colchicine . Level of care: Med-Surg  Author: Yetta Blanch, MD  [facility] 11/08/2023 2:45 PM   If 7PM-7AM, please contact night-coverage at [email or web address]

## 2023-11-08 NOTE — Evaluation (Signed)
 Physical Therapy Evaluation Patient Details Name: Kenneth [name] MRN: [id number] DOB: [birth year] Today's Date: 11/08/2023  History of Present Illness  Kenneth [name] is a 68 y.o. male admitted 11/07/23 for monoarthritis of the R knee with evident gout and question of septic arthritis. 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 difficile, hx of pancolitis, bacteremia s/p end colostomy in 09/2019), recurrent UTI, BPH, AVN L hip, and gout   Clinical Impression  Pt admitted with above diagnosis. PTA, pt was independent with functional mobility, ADLs/IADLs, driving, and working full-time for [employer] as a Engineer, civil (consulting). He lives alone in a condo with a level entry. Pt currently with functional limitations due to the deficits listed below (see PT Problem List). He performed bed mobility with modI and required CGA for OOB mobility using crutches. Pt demonstrated unsteadiness without AD. He ambulated within room and hallway with an antalgic gait pattern. Pt is currently limited by pain and decreased activity tolerance. Pt will benefit from acute skilled PT to increase his independence and safety with mobility to allow discharge.  Recommend HHPT to decrease pain, increase strength, improve balance, decrease fall risk, and optimize safety within the home environment.      If plan is discharge home, recommend the following: A little help with walking and/or transfers;Assist for transportation;Help with stairs or ramp for entrance   Can travel by private vehicle        Equipment Recommendations None recommended by PT (Pt already has DME)  Recommendations for Other Services       Functional Status Assessment Patient has had a recent decline in their functional status and demonstrates the ability to make significant  improvements in function in a reasonable and predictable amount of time.     Precautions / Restrictions Precautions Precautions: Fall Recall of Precautions/Restrictions: Intact Restrictions Weight Bearing Restrictions Per Provider Order: No      Mobility  Bed Mobility Overal bed mobility: Modified Independent             General bed mobility comments: Pt performed supine<>sit with HOB elevated ~20deg.    Transfers Overall transfer level: Needs assistance Equipment used: None, Crutches Transfers: Sit to/from Stand Sit to Stand: Contact guard assist           General transfer comment: Pt stood from lowest bed height. He pushed up with BUE support. Good eccentric control.    Ambulation/Gait Ambulation/Gait assistance: Contact guard assist Gait Distance (Feet): 200 Feet (1x58ft without AD, 1x150 with crutches) Assistive device: None, Crutches Gait Pattern/deviations: Step-to pattern, Decreased step length - right, Decreased step length - left, Decreased stance time - right, Decreased weight shift to right, Antalgic Gait velocity: decreased Gait velocity interpretation: <1.8 ft/sec, indicate of risk for recurrent falls   General Gait Details: Pt ambulated with slow labored steps maintaining BUE support on thighs. He demonstrated postural sway. Introduced crutches which increased support. Pt utilized a two-point gait pattern moving the crutch and LE simultaneously. He self-limited WBing on RLE d/t pain.  Stairs            Wheelchair Mobility     Tilt Bed    Modified Rankin (Stroke Patients Only)       Balance Overall balance assessment: Needs assistance Sitting-balance support: No upper extremity supported, Feet supported Sitting balance-Leahy Scale: Good     Standing  balance support: Bilateral upper extremity supported, During functional activity, Reliant on assistive device for balance Standing balance-Leahy Scale: Poor Standing balance comment: Pt  dependent on crutches                             Pertinent Vitals/Pain Pain Assessment Pain Assessment: Faces Faces Pain Scale: Hurts little more Pain Location: L knee with movement and weight bearing Pain Descriptors / Indicators: Grimacing, Discomfort, Guarding, Aching Pain Intervention(s): Monitored during session, Limited activity within patient's tolerance, Repositioned    Home Living Family/patient expects to be discharged to:: Private residence Living Arrangements: Alone Available Help at Discharge: Neighbor;Friend(s);Available PRN/intermittently Type of Home: House (Condo) Home Access: Level entry       Home Layout: One level Home Equipment: Engineer, agricultural (2 wheels);BSC/3in1      Prior Function Prior Level of Function : Independent/Modified Independent;Working/employed;Driving             Mobility Comments: Indep without AD. Started using crutches d/t knee pain. Denies fall history. ADLs Comments: Indep with ADLs/IADLs. 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 He is able to work from home and problem solve over the phone.     Extremity/Trunk Assessment   Upper Extremity Assessment Upper Extremity Assessment: Overall WFL for tasks assessed;Right hand dominant    Lower Extremity Assessment Lower Extremity Assessment: RLE deficits/detail (LLE overall WFL for tasks assessed.) RLE Deficits / Details: Hip AROM/strength WFL. Pt demonstrated limited knee AROM d/t pain. He is lacking full extension and reaching ~45deg of flex prior to pain. Grossly 3-/5 knee strength. Ankle AROM/strength [facility]. RLE: Unable to fully assess due to pain RLE Sensation: WNL RLE Coordination: WNL    Cervical / Trunk Assessment Cervical / Trunk Assessment: Normal  Communication   Communication Communication: No apparent difficulties    Cognition Arousal: Alert Behavior During Therapy: WFL for tasks assessed/performed   PT - Cognitive impairments: No apparent  impairments                       PT - Cognition Comments: Pt A,Ox4 Following commands: Intact       Cueing Cueing Techniques: Verbal cues     General Comments General comments (skin integrity, edema, etc.): R knee swelling and redness on medial knee. Encouraged pt to sit up in recliner chair for each meal, go into the restroom to use the bathroom, and complete two walks each day while he is admitted.    Exercises Other Exercises Other Exercises: Educated pt on general LE strengthening exercises and provided him with a handout.   Assessment/Plan    PT Assessment Patient needs continued PT services  PT Problem List Decreased strength;Decreased range of motion;Decreased activity tolerance;Decreased balance;Decreased mobility;Pain       PT Treatment Interventions DME instruction;Gait training;Functional mobility training;Therapeutic activities;Therapeutic exercise;Balance training;Patient/family education    PT Goals (Current goals can be found in the Care Plan section)  Acute Rehab PT Goals Patient Stated Goal: Return Home and have less pain PT Goal Formulation: With patient Time For Goal Achievement: 11/15/23 Potential to Achieve Goals: Good    Frequency Min 2X/week     Co-evaluation               AM-PAC PT 6 Clicks Mobility  Outcome Measure Help needed turning from your back to your side while in a flat bed without using bedrails?: None Help needed moving from lying on your back to sitting on the  side of a flat bed without using bedrails?: None Help needed moving to and from a bed to a chair (including a wheelchair)?: A Little Help needed standing up from a chair using your arms (e.g., wheelchair or bedside chair)?: A Little Help needed to walk in hospital room?: A Little Help needed climbing 3-5 steps with a railing? : A Lot 6 Click Score: 19    End of Session Equipment Utilized During Treatment: Gait belt Activity Tolerance: Patient tolerated  treatment well Patient left: in bed;with call bell/phone within reach;with bed alarm set Nurse Communication: Mobility status PT Visit Diagnosis: Difficulty in walking, not elsewhere classified (R26.2);Other abnormalities of gait and mobility (R26.89);Unsteadiness on feet (R26.81);Pain Pain - Right/Left: Right Pain - part of body: Knee    Time: 1510-1534 PT Time Calculation (min) (ACUTE ONLY): 24 min   Charges:   PT Evaluation $PT Eval Low Complexity: 1 Low PT Treatments $Gait Training: 8-22 mins PT General Charges $$ ACUTE PT VISIT: 1 Visit         Randall SAUNDERS, PT, DPT Acute Rehabilitation Services Office: [phone] Secure Chat Preferred  Kenneth [name] 11/08/2023, 3:46 PM

## 2023-11-09 DIAGNOSIS — M13161 Monoarthritis, not elsewhere classified, right knee: Secondary | ICD-10-CM | POA: Diagnosis not present

## 2023-11-09 LAB — BASIC METABOLIC PANEL WITH GFR
Anion gap: 12 (ref 5–15)
BUN: 26 mg/dL — ABNORMAL HIGH (ref 8–23)
CO2: 21 mmol/L — ABNORMAL LOW (ref 22–32)
Calcium: 8.4 mg/dL — ABNORMAL LOW (ref 8.9–10.3)
Chloride: 105 mmol/L (ref 98–111)
Creatinine, Ser: 2.47 mg/dL — ABNORMAL HIGH (ref 0.61–1.24)
GFR, Estimated: 28 mL/min — ABNORMAL LOW (ref >60)
Glucose, Bld: 96 mg/dL (ref 70–99)
Potassium: 3.7 mmol/L (ref 3.5–5.1)
Sodium: 138 mmol/L (ref 135–145)

## 2023-11-09 LAB — CBC
HCT: 36 % — ABNORMAL LOW (ref 39.0–52.0)
Hemoglobin: 11.9 g/dL — ABNORMAL LOW (ref 13.0–17.0)
MCH: 33.6 pg (ref 26.0–34.0)
MCHC: 33.1 g/dL (ref 30.0–36.0)
MCV: 101.7 fL — ABNORMAL HIGH (ref 80.0–100.0)
Platelets: 130 K/uL — ABNORMAL LOW (ref 150–400)
RBC: 3.54 MIL/uL — ABNORMAL LOW (ref 4.22–5.81)
RDW: 13.3 % (ref 11.5–15.5)
WBC: 5.9 K/uL (ref 4.0–10.5)
nRBC: 0 % (ref 0.0–0.2)

## 2023-11-09 LAB — MAGNESIUM: Magnesium: 2.2 mg/dL (ref 1.7–2.4)

## 2023-11-09 MED ORDER — HEPARIN SODIUM (PORCINE) 5000 UNIT/ML IJ SOLN
5000.0000 [IU] | Freq: Three times a day (TID) | INTRAMUSCULAR | Status: DC
  Administered 2023-11-09 (×2): 5000 [IU] via SUBCUTANEOUS
  Filled 2023-11-09 (×4): qty 1

## 2023-11-09 MED ORDER — SODIUM CHLORIDE 0.9 % IV SOLN: INTRAVENOUS | Status: DC

## 2023-11-09 MED ORDER — DOXYCYCLINE HYCLATE 100 MG PO TABS
100.0000 mg | ORAL_TABLET | Freq: Two times a day (BID) | ORAL | Status: DC
  Administered 2023-11-09 – 2023-11-10 (×3): 100 mg via ORAL
  Filled 2023-11-09 (×3): qty 1

## 2023-11-09 MED ORDER — SODIUM CHLORIDE 0.9 % IV SOLN
2.0000 g | INTRAVENOUS | Status: DC
  Administered 2023-11-09: 2 g via INTRAVENOUS
  Filled 2023-11-09: qty 20

## 2023-11-09 NOTE — Progress Notes (Signed)
 Mobility Specialist Progress Note:   11/09/23 1400  Mobility  Activity Ambulated with assistance  Level of Assistance Contact guard assist, steadying assist  Assistive Device Crutches  Distance Ambulated (ft) 300 ft  Activity Response Tolerated well  Mobility Referral Yes  Mobility visit 1 Mobility  Mobility Specialist Start Time (ACUTE ONLY) 1015  Mobility Specialist Stop Time (ACUTE ONLY) 1030  Mobility Specialist Time Calculation (min) (ACUTE ONLY) 15 min   Received pt in bed having no complaints and agreeable to mobility. Pt was asymptomatic throughout ambulation and returned to room w/o fault. Left in bed w/ call bell in reach and all needs met.   Thersia Minder Mobility Specialist  Please contact vis Secure Chat or  Rehab Office [phone]

## 2023-11-09 NOTE — Plan of Care (Signed)

## 2023-11-09 NOTE — Progress Notes (Signed)
   11/09/23 2218  BiPAP/CPAP/SIPAP  Reason BIPAP/CPAP not in use Non-compliant   Patient declines CPAP. No unit in room at this time.

## 2023-11-09 NOTE — Progress Notes (Signed)
 [facility] Progress Note Patient: Kenneth [name] FMW:[id number] DOB: 1956-03-31 DOA: 11/07/2023  DOS: the patient was seen and examined on 11/09/2023  Brief Hospital Course: Patient with PMH of HIV on HAART, renal transplant, immunosuppression, right nephrectomy, EBV viremia, BPH, left hip AVN present to the hospital with complaints of right knee swelling and pain. Currently being treated for his gout and in the process of being ruled out for septic arthritis.  Assessment and Plan: Acute severe gout attack right knee. Concern for septic arthritis. Presents with complaints of worsening knee pain for last few days. On admission temperature 100.8.  No trauma. X-ray right knee shows evidence of moderate joint effusion. Underwent arthrocentesis in ED.  WBC 47,000, neutrophil 93, positive for intracellular monosodium urate crystals as well as extracellular monosodium urate crystals. Started on broad-spectrum antibiotic given immunosuppressed status as well as colchicine . Pain appears to be improving. Mobility improving. Orthopedic were consulted, recommend culture clearance prior to discharge. So far joint culture as well as blood culture negative for 48 hours. Initially on IV vancomycin  and cefepime .  Changing to ceftriaxone  and doxycycline .  History of renal transplant. Right nephrectomy. CKD stage IIIb. Baseline serum creatinine appears to be around 2.2. On admission serum creatinine at the same level.  Currently mildly elevated 2.47 with BUN elevation as well. Giving IV fluid.  Not meeting AKI criteria. On Prograf  at home, level currently pending. On prednisone  5 mg daily currently continued. Continue Plaquenil  as well.  BPH. On Flomax  as well as Proscar .  Continue.  HLD. On statin.  Continue.  HTN. On Norvasc  5 mg daily as well as Coreg  6.25 mg twice daily.  Blood pressure stable.  Continue.  History of HIV. Patient is compliant with Triumeq . HIV viral load March 2025  not detected. Monitor.   Subjective: Pain improving.  No nausea no vomiting no fever no chills.  Physical Exam: Clear to auscultation. S1-S2 present. Right knee swelling unchanged.  Warmth unchanged.  Data Reviewed: I have Reviewed nursing notes, Vitals, and Lab results. Since last encounter, pertinent lab results CBC and BMP   . I have ordered test including CBC and BMP  .   Disposition: Status is: Inpatient Remains inpatient appropriate because: Monitor for improvement in pain culture clearance  heparin  injection 5,000 Units Start: 11/09/23 0900 SCDs Start: 11/08/23 0054   Family Communication: No one at bedside Level of care: Med-Surg   Vitals:   11/08/23 2012 11/09/23 0422 11/09/23 0846 11/09/23 1507  BP: (!) 141/65 132/72 134/69 138/68  Pulse: 71 65 67 63  Resp: 18 17 16 16   Temp: 99 F (37.2 C) 98.9 F (37.2 C) 98.6 F (37 C) 98.5 F (36.9 C)  TempSrc: Oral Oral Oral Oral  SpO2: 98% 98% 99% 98%  Weight:      Height:         Author: Yetta Blanch, MD 11/09/2023 6:24 PM  Please look on [email or web address] to find out who is on call.

## 2023-11-09 NOTE — Hospital Course (Addendum)
 Patient with PMH of HIV on HAART, renal transplant, immunosuppression, right nephrectomy, EBV viremia, BPH, left hip AVN present to the hospital with complaints of right knee swelling and pain. Currently being treated for his gout and in the process of being ruled out for septic arthritis.  Assessment and Plan: Acute severe gout attack right knee. Concern for septic arthritis. Presents with complaints of worsening knee pain for last few days. On admission temperature 100.8.  No trauma. X-ray right knee shows evidence of moderate joint effusion. Underwent arthrocentesis in ED.  WBC 47,000, neutrophil 93, positive for intracellular monosodium urate crystals as well as extracellular monosodium urate crystals. Started on broad-spectrum antibiotic given immunosuppressed status as well as colchicine . Pain appears to be improving. Mobility improving. Orthopedic were consulted, recommend culture clearance prior to discharge. So far joint culture as well as blood culture negative for 48 hours. Initially on IV vancomycin  and cefepime .  Changing to ceftriaxone  and doxycycline .  History of renal transplant. Right nephrectomy. CKD stage IIIb. Baseline serum creatinine appears to be around 2.2. On admission serum creatinine at the same level.  Currently mildly elevated 2.47 with BUN elevation as well. Giving IV fluid.  Not meeting AKI criteria. On Prograf  at home, level currently pending. On prednisone  5 mg daily currently continued. Continue Plaquenil  as well.  BPH. On Flomax  as well as Proscar .  Continue.  HLD. On statin.  Continue.  HTN. On Norvasc  5 mg daily as well as Coreg  6.25 mg twice daily.  Blood pressure stable.  Continue.  History of HIV. Patient is compliant with Triumeq . HIV viral load March 2025 not detected. Monitor.

## 2023-11-10 DIAGNOSIS — M13161 Monoarthritis, not elsewhere classified, right knee: Secondary | ICD-10-CM | POA: Diagnosis not present

## 2023-11-10 LAB — RENAL FUNCTION PANEL
Albumin: 2.7 g/dL — ABNORMAL LOW (ref 3.5–5.0)
Anion gap: 8 (ref 5–15)
BUN: 26 mg/dL — ABNORMAL HIGH (ref 8–23)
CO2: 19 mmol/L — ABNORMAL LOW (ref 22–32)
Calcium: 8 mg/dL — ABNORMAL LOW (ref 8.9–10.3)
Chloride: 110 mmol/L (ref 98–111)
Creatinine, Ser: 2.26 mg/dL — ABNORMAL HIGH (ref 0.61–1.24)
GFR, Estimated: 31 mL/min — ABNORMAL LOW (ref >60)
Glucose, Bld: 108 mg/dL — ABNORMAL HIGH (ref 70–99)
Phosphorus: 2.6 mg/dL (ref 2.5–4.6)
Potassium: 4.1 mmol/L (ref 3.5–5.1)
Sodium: 137 mmol/L (ref 135–145)

## 2023-11-10 LAB — CBC
HCT: 34 % — ABNORMAL LOW (ref 39.0–52.0)
Hemoglobin: 11.4 g/dL — ABNORMAL LOW (ref 13.0–17.0)
MCH: 33.8 pg (ref 26.0–34.0)
MCHC: 33.5 g/dL (ref 30.0–36.0)
MCV: 100.9 fL — ABNORMAL HIGH (ref 80.0–100.0)
Platelets: 136 K/uL — ABNORMAL LOW (ref 150–400)
RBC: 3.37 MIL/uL — ABNORMAL LOW (ref 4.22–5.81)
RDW: 13.2 % (ref 11.5–15.5)
WBC: 5.8 K/uL (ref 4.0–10.5)
nRBC: 0 % (ref 0.0–0.2)

## 2023-11-10 LAB — TACROLIMUS LEVEL: Tacrolimus (FK506) - LabCorp: 6.1 ng/mL (ref 5.0–20.0)

## 2023-11-10 LAB — C-REACTIVE PROTEIN: CRP: 10.2 mg/dL — ABNORMAL HIGH (ref <1.0)

## 2023-11-10 MED ORDER — CEPHALEXIN 500 MG PO CAPS: 500.0000 mg | ORAL_CAPSULE | Freq: Every day | ORAL | 0 refills | Status: DC

## 2023-11-10 MED ORDER — CEPHALEXIN 500 MG PO CAPS: 500.0000 mg | ORAL_CAPSULE | Freq: Two times a day (BID) | ORAL | 0 refills | Status: AC

## 2023-11-10 MED ORDER — PREDNISONE 10 MG PO TABS: 10.0000 mg | ORAL_TABLET | Freq: Every day | ORAL | 0 refills | Status: AC

## 2023-11-10 MED ORDER — DOXYCYCLINE HYCLATE 100 MG PO TABS: 100.0000 mg | ORAL_TABLET | Freq: Two times a day (BID) | ORAL | 0 refills | Status: AC

## 2023-11-10 NOTE — Progress Notes (Signed)
 Mobility Specialist Progress Note   11/10/23 1105  Mobility  Activity Ambulated independently  Level of Assistance Standby assist, set-up cues, supervision of patient - no hands on  Assistive Device None  Distance Ambulated (ft) 580 ft  Range of Motion/Exercises Active;All extremities  Activity Response Tolerated well  Mobility Specialist Start Time (ACUTE ONLY) 1105  Mobility Specialist Stop Time (ACUTE ONLY) 1114  Mobility Specialist Time Calculation (min) (ACUTE ONLY) 9 min   Patient received in supine agreeable to participate. Patient was able to demonstrate bed mobility and complete ambulation, supervision to Mod I, and without the need of an AD. Much improvement than prior sessions noted, though slight antalgic gait still present.Tolerated well without any complaint of pain but mentioned some stiffness. Returned to room without incident and was left dangling with all needs met, call bell in reach.   [place] Brinsley Wence, BS EXP Mobility Specialist Please contact via SecureChat or Rehab office at [phone]

## 2023-11-10 NOTE — Discharge Summary (Signed)
 Physician Discharge Summary   Patient: Kenneth [name] MRN: [id number] DOB: [birth year]  Admit date:     11/07/2023  Discharge date: 11/10/2023  Discharge Physician: Yetta Blanch  PCP: Alec House, MD  Recommendations at discharge: Follow-up with PCP in 1 week.   Follow-up Information     Fulp, Cammie, MD. Schedule an appointment as soon as possible for a visit in 1 week(s).   Specialty: Family Medicine Contact information: [address] [place] [address] [phone]         [facility] Follow up.   Specialty: Rehabilitation Why: Physical therapy. Office will call to arrange follow up after hospital discharge. Contact information: [address] [place]  [phone]               Discharge Diagnoses: Principal Problem:   Monoarthritis of right knee  Hospital Course: Patient with PMH of HIV on HAART, renal transplant, immunosuppression, right nephrectomy, EBV viremia, BPH, left hip AVN present to the hospital with complaints of right knee swelling and pain. Currently being treated for his gout and in the process of being ruled out for septic arthritis.  Assessment and Plan: Acute severe gout attack right knee. Concern for septic arthritis.  Ruled out Presents with complaints of worsening knee pain for last few days. On admission temperature 100.8.  No trauma. X-ray right knee shows evidence of moderate joint effusion. Underwent arthrocentesis in ED.  WBC 47,000, neutrophil 93, positive for intracellular monosodium urate crystals as well as extracellular monosodium urate crystals. Started on broad-spectrum antibiotic given immunosuppressed status as well as colchicine . Pain appears to be improving. Mobility improving. Orthopedic were consulted, recommend culture clearance prior to discharge. So far joint culture as well as blood culture negative for 72 hours. Initially  on IV vancomycin  and cefepime .  Changing to ceftriaxone  and doxycycline .  Discharged on oral Keflex  and doxycycline  for 2 more days for possible cellulitis. Septic arthritis was ruled out. Will provide increased dose of prednisone  for taper.  Discontinue colchicine  given resolution of pain.  History of renal transplant. Right nephrectomy. CKD stage IIIb. Baseline serum creatinine appears to be around 2.2. On admission serum creatinine at the same level.  Currently mildly elevated 2.47 with BUN elevation as well. Giving IV fluid.  Not meeting AKI criteria. On Prograf  at home, level currently pending. On prednisone  5 mg daily currently continued. Continue Plaquenil  as well.  BPH. On Flomax  as well as Proscar .  Continue.  HLD. On statin.  Continue.  HTN. On Norvasc  5 mg daily as well as Coreg  6.25 mg twice daily.  Blood pressure stable.  Continue.  History of HIV. Patient is compliant with Triumeq . HIV viral load March 2025 not detected. Monitor.  Pain control - [place]  Controlled Substance Reporting System database was reviewed. and patient was instructed, not to drive, operate heavy machinery, perform activities at heights, swimming or participation in water  activities or provide baby-sitting services while on Pain, Sleep and Anxiety Medications; until their outpatient Physician has advised to do so again. Also recommended to not to take more than prescribed Pain, Sleep and Anxiety Medications.  Consultants:  Orthopedic surgery  Procedures performed:  Bedside arthrocentesis by ED provider  DISCHARGE MEDICATION: Allergies as of 11/10/2023       Reactions   Nsaids Other (See Comments)   Told to avoid by MD        Medication List     PAUSE taking these medications  predniSONE  5 MG tablet Wait to take this until: November 13, 2023 While you are on higher dose of prednisone  Commonly known as: DELTASONE  Take 5 mg by mouth every evening. You also have another  medication with the same name that you may need to continue taking.       TAKE these medications    abacavir -dolutegravir -lamiVUDine  600-50-300 MG tablet Commonly known as: TRIUMEQ  Take 1 tablet by mouth daily. What changed: when to take this   amLODipine  5 MG tablet Commonly known as: NORVASC  Take 5 mg by mouth daily at 12 noon.   carvedilol  6.25 MG tablet Commonly known as: COREG  Take 6.25 mg by mouth in the morning and at bedtime.   cephALEXin  500 MG capsule Commonly known as: KEFLEX  Take 1 capsule (500 mg total) by mouth 2 (two) times daily for 2 days.   doxycycline  100 MG tablet Commonly known as: VIBRA -TABS Take 1 tablet (100 mg total) by mouth every 12 (twelve) hours for 2 days.   finasteride  5 MG tablet Commonly known as: PROSCAR  Take 5 mg by mouth every evening.   hydroxychloroquine  200 MG tablet Commonly known as: PLAQUENIL  Take 1 tablet (200 mg total) by mouth daily. What changed: when to take this   predniSONE  10 MG tablet Commonly known as: DELTASONE  Take 1 tablet (10 mg total) by mouth daily for 2 days. What changed: Another medication with the same name was paused. Ask your nurse or doctor if you should take this medication.   Prolia  60 MG/ML Sosy injection Generic drug: denosumab  Inject 60 mg into the skin every 6 (six) months.   rosuvastatin  5 MG tablet Commonly known as: CRESTOR  Take 5 mg by mouth every evening.   tacrolimus  0.5 MG capsule Commonly known as: PROGRAF  Take 0.5 mg by mouth See admin instructions. Take 1 capsule (0.5mg ) by mouth twice daily in the morning and evening.   tamsulosin  0.4 MG Caps capsule Commonly known as: FLOMAX  Take 0.4 mg by mouth every evening.   VITAMIN D -3 PO Take 1 tablet by mouth daily.       Disposition: Home Diet recommendation: Renal diet  Discharge Exam: Vitals:   11/09/23 1936 11/10/23 0456 11/10/23 0809 11/10/23 1421  BP: 127/72 (!) 120/56 121/72 (!) 141/68  Pulse: 60 (!) 55 (!) 58 67   Resp: 17 17 16 15   Temp: 98.9 F (37.2 C) 98.7 F (37.1 C) 98.1 F (36.7 C) 97.8 F (36.6 C)  TempSrc: Oral  Oral Oral  SpO2: 99% 99% 100% 100%  Weight:      Height:       Clear to auscultation. S1-S2 present Improving swelling of the right knee as well as improving warmth.  Filed Weights   11/07/23 1445  Weight: 68 kg   Condition at discharge: stable  The results of significant diagnostics from this hospitalization (including imaging, microbiology, ancillary and laboratory) are listed below for reference.   Imaging Studies: VAS US  LOWER EXTREMITY VENOUS (DVT) (ONLY [facility] & [facility]) Result Date: 11/07/2023  Lower Venous DVT Study Patient Name:  MAJESTY OEHLERT  Date of Exam:   11/07/2023 Medical Rec #: [id number]         Accession #:    [id number] Date of Birth: Jul 23, 1955          Patient Gender: M Patient Age:   68 years Exam Location:  [facility] Procedure:      VAS US  LOWER EXTREMITY VENOUS (DVT) Referring Phys: ABIGAIL HARRIS --------------------------------------------------------------------------------  Indications: RLE knee  pain and swelling.  Comparison Study: Previous exam on 01/28/2017 was negative for DVT Performing Technologist: Ezzie Potters RVT, RDMS  Examination Guidelines: A complete evaluation includes B-mode imaging, spectral Doppler, color Doppler, and power Doppler as needed of all accessible portions of each vessel. Bilateral testing is considered an integral part of a complete examination. Limited examinations for reoccurring indications may be performed as noted. The reflux portion of the exam is performed with the patient in reverse Trendelenburg.  +---------+---------------+---------+-----------+----------+--------------+ RIGHT    CompressibilityPhasicitySpontaneityPropertiesThrombus Aging +---------+---------------+---------+-----------+----------+--------------+ CFV      Full           Yes      Yes                                  +---------+---------------+---------+-----------+----------+--------------+ SFJ      Full                                                        +---------+---------------+---------+-----------+----------+--------------+ FV Prox  Full           Yes      Yes                                 +---------+---------------+---------+-----------+----------+--------------+ FV Mid   Full           Yes      Yes                                 +---------+---------------+---------+-----------+----------+--------------+ FV DistalFull           Yes      Yes                                 +---------+---------------+---------+-----------+----------+--------------+ PFV      Full                                                        +---------+---------------+---------+-----------+----------+--------------+ POP      Full           Yes      Yes                                 +---------+---------------+---------+-----------+----------+--------------+ PTV      Full                                                        +---------+---------------+---------+-----------+----------+--------------+ PERO     Full                                                        +---------+---------------+---------+-----------+----------+--------------+   +----+---------------+---------+-----------+----------+--------------+  LEFTCompressibilityPhasicitySpontaneityPropertiesThrombus Aging +----+---------------+---------+-----------+----------+--------------+ CFV Full           Yes      Yes                                 +----+---------------+---------+-----------+----------+--------------+     Summary: RIGHT: - There is no evidence of deep vein thrombosis in the lower extremity.  - Ultrasound characteristics of a ruptured popliteal cyst are noted.  LEFT: - No evidence of common femoral vein obstruction.   *See table(s) above for measurements and observations. Electronically signed by  Gaile New MD on 11/07/2023 at 6:11:49 PM.    Final    DG Knee Right Port Result Date: 11/07/2023 CLINICAL DATA:  Acute right knee pain and swelling without reported injury. EXAM: PORTABLE RIGHT KNEE - 1-2 VIEW COMPARISON:  January 23, 2017. FINDINGS: Status post surgical internal fixation of old right tibial plateau fracture. No acute fracture or dislocation is noted. Moderate suprapatellar joint effusion is noted. IMPRESSION: Moderate suprapatellar joint effusion. No acute fracture or dislocation. Electronically Signed   By: Lynwood Landy Raddle M.D.   On: 11/07/2023 16:17    Microbiology: Results for orders placed or performed during the hospital encounter of 11/07/23  Blood Culture (routine x 2)     Status: None (Preliminary result)   Collection Time: 11/07/23  2:57 PM   Specimen: BLOOD  Result Value Ref Range Status   Specimen Description BLOOD RIGHT ANTECUBITAL  Final   Special Requests   Final    BOTTLES DRAWN AEROBIC ONLY Blood Culture results may not be optimal due to an inadequate volume of blood received in culture bottles   Culture   Final    NO GROWTH 3 DAYS Performed at [facility] Lab, [address], [place], [place] [address]    Report Status PENDING  Incomplete  Body fluid culture w Gram Stain     Status: None (Preliminary result)   Collection Time: 11/07/23  3:43 PM   Specimen: Synovium; Body Fluid  Result Value Ref Range Status   Specimen Description SYNOVIAL  Final   Special Requests RIGHT KNEE  Final   Gram Stain   Final    FEW WBC PRESENT, PREDOMINANTLY PMN NO ORGANISMS SEEN    Culture   Final    NO GROWTH 3 DAYS Performed at [facility] Lab, [address], [place], [place] [address]    Report Status PENDING  Incomplete  Blood Culture (routine x 2)     Status: None (Preliminary result)   Collection Time: 11/07/23  3:55 PM   Specimen: BLOOD RIGHT FOREARM  Result Value Ref Range Status   Specimen Description BLOOD RIGHT FOREARM  Final   Special Requests    Final    BOTTLES DRAWN AEROBIC AND ANAEROBIC Blood Culture adequate volume   Culture   Final    NO GROWTH 3 DAYS Performed at [facility], [address], [place], [place] [address]    Report Status PENDING  Incomplete   Labs: CBC: Recent Labs  Lab 11/07/23 1454 11/07/23 1610 11/08/23 0546 11/09/23 0911 11/10/23 0725  WBC 8.3  --  6.8 5.9 5.8  NEUTROABS 5.3  --   --   --   --   HGB 14.4 13.3 12.4* 11.9* 11.4*  HCT 44.0 39.0 36.5* 36.0* 34.0*  MCV 104.3*  --  101.1* 101.7* 100.9*  PLT 146*  --  122* 130*  136*   Basic Metabolic Panel: Recent Labs  Lab 11/07/23 1454 11/07/23 1610 11/08/23 0546 11/09/23 0911 11/10/23 0725  NA 139 139 137 138 137  K 4.0 4.0 4.0 3.7 4.1  CL 104 106 107 105 110  CO2 23  --  21* 21* 19*  GLUCOSE 99 98 108* 96 108*  BUN 17 20 23  26* 26*  CREATININE 2.19* 2.20* 2.15* 2.47* 2.26*  CALCIUM  9.7  --  8.5* 8.4* 8.0*  MG  --   --  1.6* 2.2  --   PHOS  --   --  2.8  --  2.6   Liver Function Tests: Recent Labs  Lab 11/07/23 1454 11/10/23 0725  AST 19  --   ALT 16  --   ALKPHOS 51  --   BILITOT 1.2  --   PROT 8.4*  --   ALBUMIN 4.0 2.7*   CBG: Recent Labs  Lab 11/07/23 1559  GLUCAP 93    Discharge time spent: greater than 30 minutes.  Author: Yetta Blanch, MD  [facility] 11/10/2023

## 2023-11-11 LAB — GLUCOSE, BODY FLUID OTHER: Glucose, Body Fluid Other: 8 mg/dL

## 2023-11-11 LAB — BODY FLUID CULTURE W GRAM STAIN: Culture: NO GROWTH

## 2023-11-12 LAB — CULTURE, BLOOD (ROUTINE X 2)
Culture: NO GROWTH
Culture: NO GROWTH
Special Requests: ADEQUATE

## 2023-11-29 ENCOUNTER — Ambulatory Visit: Attending: Internal Medicine

## 2023-11-29 ENCOUNTER — Other Ambulatory Visit: Payer: Self-pay

## 2023-11-29 DIAGNOSIS — M6281 Muscle weakness (generalized): Secondary | ICD-10-CM | POA: Insufficient documentation

## 2023-11-29 DIAGNOSIS — M25561 Pain in right knee: Secondary | ICD-10-CM | POA: Diagnosis present

## 2023-11-29 DIAGNOSIS — M25552 Pain in left hip: Secondary | ICD-10-CM | POA: Insufficient documentation

## 2023-11-29 DIAGNOSIS — M25562 Pain in left knee: Secondary | ICD-10-CM | POA: Insufficient documentation

## 2023-11-29 DIAGNOSIS — R2689 Other abnormalities of gait and mobility: Secondary | ICD-10-CM | POA: Insufficient documentation

## 2023-11-29 NOTE — Therapy (Signed)
 OUTPATIENT PHYSICAL THERAPY LOWER EXTREMITY EVALUATION   Patient Name: Kenneth [name] MRN: [id number] DOB:[birth year], 68 y.o., male Today's Date: 11/29/2023  END OF SESSION:  PT End of Session - 11/29/23 1307     Visit Number 1    Number of Visits 9    Date for PT Re-Evaluation 01/24/24    Authorization Type [employer]    PT Start Time 0800    PT Stop Time 0835    PT Time Calculation (min) 35 min    Activity Tolerance Patient tolerated treatment well    Behavior During Therapy [facility] for tasks assessed/performed          Past Medical History:  Diagnosis Date   Achalasia    Candida infection, esophageal (HCC) 08/2010   a. 2012 - noted on EGD.   DVT (deep venous thrombosis) (HCC) ~ 04/2014   ?RLE   Dysphagia 2008   post heller myotomy/toupee fundoplication.     ESRD (end stage renal disease) on dialysis [facility])    MWF: [facility] (01/22/2017)   Fall 01/22/2017   mechanical fall w/multiple fractures/notes 01/22/2017   Gait instability    GERD (gastroesophageal reflux disease)    hx (01/22/2017)   Hemorrhoids, internal    History of blood transfusion 03/2014   low counts   History of gout    History of stomach ulcers    HIV infection (HCC) dx ~ 2009   a. 02/13/2014 CD4 = 240 - undetectable viral load.   Hyperlipidemia    Hypertension    hx (01/22/2017)   Insomnia    Lupus nephritis (HCC)    Pancytopenia (HCC)    Premature ventricular contractions    Renal abscess    SLE (systemic lupus erythematosus) (HCC) dx'd 2015   Past Surgical History:  Procedure Laterality Date   BASCILIC VEIN TRANSPOSITION Left 06/29/2014   Procedure: FIRST STAGE  BASCILIC VEIN TRANSPOSITION;  Surgeon: Krystal JULIANNA Doing, MD;  Location: [facility] OR;  Service: Vascular;  Laterality: Left;   BASCILIC VEIN TRANSPOSITION Left 09/11/2014   Procedure: SECOND STAGE BASILIC VEIN TRANSPOSITION LEFT UPPER ARM;  Surgeon: Krystal JULIANNA Doing, MD;  Location: [facility] OR;  Service: Vascular;  Laterality: Left;   BOWEL  DECOMPRESSION N/A 09/29/2018   Procedure: BOWEL DECOMPRESSION;  Surgeon: Celestia Agent, MD;  Location: [facility] ENDOSCOPY;  Service: Endoscopy;  Laterality: N/A;   COLONOSCOPY  2008   .  tortuous colon, internal hemorrhoids.  no polyps or diverticulosis.    ESOPHAGOGASTRODUODENOSCOPY  08/2010   Dr Celestia.  08/2010 candida esophagitis, no obvious esophageal stricture. 02/2006 and 05/2006 dilated esophagus, narrowed distal esophagus but no stricture, was empirically Savary dilated   ESOPHAGOGASTRODUODENOSCOPY N/A 06/27/2014   Procedure: ESOPHAGOGASTRODUODENOSCOPY (EGD);  Surgeon: Norleen Hint, MD;  Location: [facility] ENDOSCOPY;  Service: Endoscopy;  Laterality: N/A;   ESOPHAGOGASTRODUODENOSCOPY N/A 07/02/2014   Procedure: ESOPHAGOGASTRODUODENOSCOPY (EGD);  Surgeon: Norleen Hint, MD;  Location: [facility] ENDOSCOPY;  Service: Endoscopy;  Laterality: N/A;  with botox    FLEXIBLE SIGMOIDOSCOPY N/A 09/29/2018   Procedure: FLEXIBLE SIGMOIDOSCOPY;  Surgeon: Celestia Agent, MD;  Location: [facility] ENDOSCOPY;  Service: Endoscopy;  Laterality: N/A;   HELLER MYOTOMY  1999   with toupee fundoplication of HH.    HERNIA REPAIR     INSERTION OF DIALYSIS CATHETER Right 06/29/2014   Procedure: INSERTION OF DIALYSIS CATHETER RIGHT IJ;  Surgeon: Krystal JULIANNA Doing, MD;  Location: [facility] OR;  Service: Vascular;  Laterality: Right;   KIDNEY TRANSPLANT     January 2020 @ [facility]  NEPHRECTOMY Right ~ 2011   ORIF TIBIA PLATEAU Right 01/23/2017   Procedure: OPEN REDUCTION INTERNAL FIXATION (ORIF) TIBIAL PLATEAU; REPAIR OF LATERAL TIBIAL PLATEAU; ARTHROTOMY WITH MENISCUS REPAIR; ANTERIOR COMPARTMENT FASCIOTOMY;  Surgeon: Celena Sharper, MD;  Location: [facility] OR;  Service: Orthopedics;  Laterality: Right;   Patient Active Problem List   Diagnosis Date Noted   Monoarthritis of right knee 11/07/2023   High risk medication use 04/13/2022   Avascular necrosis of bone of left hip (HCC) 11/30/2021   Vitamin D  deficiency 11/30/2021   History of creation of ostomy (HCC)  01/22/2019   Volvulus of sigmoid colon S/P colostomy(HCC) 12/31/2018   Adult failure to thrive 12/04/2018   Greater trochanter fracture (HCC) 10/23/2018   Severe sepsis (HCC) 09/29/2018   Clostridium enterocolitis 09/29/2018   ATN (acute tubular necrosis) (HCC) 09/29/2018   Ileus (HCC) 09/29/2018   Leucopenia    Current chronic use of systemic steroids 09/24/2018   History of esophageal ulcer 09/21/2018   History of renal transplant 09/19/2018   Pelvic fracture (HCC) 08/25/2018   Multiple falls 08/25/2018   Thumb fracture 08/25/2018   Closed nondisplaced zone I fracture of sacrum (HCC) 08/22/2018   Tachycardia 08/22/2018   Suprapubic pain 04/11/2018   Osteoporosis 03/15/2017   Chronic fatigue 03/11/2015   SLE (systemic lupus erythematosus) (HCC) 02/24/2015   AKI (acute kidney injury) (HCC)    Dysphagia    ESRD (end stage renal disease) (HCC)    DVT (deep venous thrombosis) (HCC) 06/22/2014   PVC's (premature ventricular contractions) 03/17/2014   Abdominal pain    Protein-calorie malnutrition, severe (HCC) 02/11/2014   Lupus nephritis (HCC)    Arthralgia of multiple sites, bilateral 02/02/2014   Bilateral low back pain without sciatica 02/01/2014   External hemorrhoids 01/20/2009   Macrocytic anemia 08/31/2006   HIV disease (HCC) 07/24/2006   Hyperlipidemia 05/31/2006   ERECTILE DYSFUNCTION 05/31/2006   INSOMNIA NOS 05/31/2006    PCP: Alec House, MD   REFERRING PROVIDER: Tobie Yetta HERO, MD  REFERRING DIAG: R53.1 (ICD-10-CM) - Weakness   THERAPY DIAG:  Acute pain of right knee - Plan: PT plan of care cert/re-cert  Acute pain of left knee - Plan: PT plan of care cert/re-cert  Pain in left hip - Plan: PT plan of care cert/re-cert  Muscle weakness (generalized) - Plan: PT plan of care cert/re-cert  Other abnormalities of gait and mobility - Plan: PT plan of care cert/re-cert  Rationale for Evaluation and Treatment: Rehabilitation  ONSET DATE:  11/07/2023  SUBJECTIVE:   SUBJECTIVE STATEMENT: Pt presents to PT after recent hospitalization regarding severe R knee pain which was due to possible gout flair. Also moved to L knee, stair navigation and overall mobility has been more labored. Also has L hip pain that is chronic, is thinking about having it replaced in future.   PERTINENT HISTORY: HTN, Kidney transplant, HIV  PAIN:  Are you having pain?  Yes: NPRS scale: 0/10 Pain location: 2/10  Pain description: bilateral knee Aggravating factors: stairs, walking Relieving factors: rest  Are you having pain?  Yes: NPRS scale: 0/10 Pain location: 8/10  Pain description: left hip Aggravating factors: sleeping, walking Relieving factors: rest  PRECAUTIONS: None  RED FLAGS: None   WEIGHT BEARING RESTRICTIONS: No  FALLS:  Has patient fallen in last 6 months? No  LIVING ENVIRONMENT: Lives with: lives alone Lives in: House/apartment  OCCUPATION: Engineer, civil (consulting) at [employer]  PLOF: Independent  PATIENT GOALS: decrease pain, improve balance and mobility with stairs  NEXT  MD VISIT: October 2025  OBJECTIVE:  Note: Objective measures were completed at Evaluation unless otherwise noted.  DIAGNOSTIC FINDINGS: See imaging   PATIENT SURVEYS:  PSFS: THE PATIENT SPECIFIC FUNCTIONAL SCALE  Place score of 0-10 (0 = unable to perform activity and 10 = able to perform activity at the same level as before injury or problem)  Activity Date: 11/29/23    Stairs  7    2. Walking  3    3.     4.      Total Score 5      Total Score = Sum of activity scores/number of activities  Minimally Detectable Change: 3 points (for single activity); 2 points (for average score)  [facility] (nd). The Patient Specific Functional Scale . Retrieved from [email or web address]   COGNITION: Overall cognitive status: Within functional limits for tasks  assessed     SENSATION: WFL  PALPATION: TTP to L lateral hip, R quad  LOWER EXTREMITY ROM:  Active ROM Right eval Left eval  Hip flexion    Hip extension    Hip abduction    Hip adduction    Hip internal rotation    Hip external rotation    Knee flexion 125 125  Knee extension 5 0  Ankle dorsiflexion    Ankle plantarflexion    Ankle inversion    Ankle eversion     (Blank rows = not tested)  LOWER EXTREMITY MMT:  MMT Right eval Left eval  Hip flexion 5 4  Hip extension    Hip abduction    Hip adduction    Hip internal rotation    Hip external rotation    Knee flexion 4 4  Knee extension 4 4  Ankle dorsiflexion    Ankle plantarflexion    Ankle inversion    Ankle eversion     (Blank rows = not tested)   FUNCTIONAL TESTS:  30 Second Sit to Stand: 12 reps with UE  GAIT: Distance walked: 78ft Assistive device utilized: None Level of assistance: Complete Independence Comments: slight antalgic gait; decreased speed during stair navigation, reciprocal gait with no buckling  TREATMENT: [facility] Adult PT Treatment:                                                DATE: 11/29/2023 Therapeutic Exercise: Supine QS x 5 - 5 hold SLR x 5  Hooklying clamshell x 10 GTB LAQ x 5 ea Seated hamstring stretch x 30 R  PATIENT EDUCATION:  Education details: eval findings, PSFS, HEP, POC Person educated: Patient Education method: Explanation, Demonstration, and Handouts Education comprehension: verbalized understanding and returned demonstration  HOME EXERCISE PROGRAM: Access Code: [email or web address] URL: [email or web address] Date: 11/29/2023 Prepared by: Alm Kingdom  Exercises - Supine Quadricep Sets  - 2 x daily - 7 x weekly - 2 sets - 10 reps - 5 sec hold - Active Straight Leg Raise with Quad Set  - 2 x daily - 7 x weekly - 2 sets - 10 reps - Hooklying Clamshell with Resistance  - 2 x daily - 7 x weekly - 3 sets - 10 reps - green band hold - Seated Long Arc  Quad  - 2 x daily - 7 x weekly - 3 sets - 10 reps - Seated Hamstring Stretch  - 2 x daily - 7 x weekly - 2-3 reps -  30 sec hold  ASSESSMENT:  CLINICAL IMPRESSION: Patient is a 68 y.o. M who was seen today for physical therapy evaluation and treatment for acute knee pain and recent hospitalization along with LE weakness. Physical findings are consistent with subjective and recent hospitalization timeline. Demonstrates decrease in LE strength, general functional mobility, and R knee ROM. Pt would benefit from skilled PT services working on improving LE strength and stability in order to decrease pain and improve stair navigation.   OBJECTIVE IMPAIRMENTS: Abnormal gait, decreased activity tolerance, decreased balance, decreased mobility, difficulty walking, decreased ROM, decreased strength, and pain.   ACTIVITY LIMITATIONS: carrying, lifting, standing, squatting, stairs, transfers, and locomotion level  PARTICIPATION LIMITATIONS: driving, shopping, community activity, occupation, and yard work  PERSONAL FACTORS: Time since onset of injury/illness/exacerbation and 3+ comorbidities: HTN, Kidney transplant, HIV are also affecting patient's functional outcome.   REHAB POTENTIAL: Good  CLINICAL DECISION MAKING: Evolving/moderate complexity  EVALUATION COMPLEXITY: Moderate   GOALS: Goals reviewed with patient? No  SHORT TERM GOALS: Target date: 12/20/2023   Pt will be compliant and knowledgeable with initial HEP for improved comfort and carryover Baseline: initial HEP given  Goal status: INITIAL  2.  Pt will self report knee and hip pain no greater than 5/10 for improved comfort and functional ability Baseline: 8/10 at worst Goal status: INITIAL   LONG TERM GOALS: Target date: 01/24/2024   Pt will improve PSFS to no less than 3 points as proxy for functional improvement with home ADLs and higher level community activity Baseline: 5 Goal status: INITIAL   2.  Pt will self report  bilateral knee and L hip pain no greater than 3/10 for improved comfort and functional ability Baseline: 8/10 at worst Goal status: INITIAL   3.  Pt will increase 30 Second Sit to Stand rep count to no less than 14 reps for improved balance, strength, and functional mobility Baseline: 12 reps  Goal status: INITIAL   4.  Pt will improve all LE MMT to at least 5/5 for improved comfort and functional mobility  Baseline: see chart Goal status: INITIAL  5.  Pt will improve R knee extension AROM to 0 degrees for improved comfort and function Baseline: 5 degrees Goal status: INITIAL    PLAN:  PT FREQUENCY: 1x/week  PT DURATION: 8 weeks  PLANNED INTERVENTIONS: [id number]- PT Re-evaluation, 97110-Therapeutic exercises, 97530- Therapeutic activity, [id number]- Neuromuscular re-education, 97535- Self Care, [id number]- Manual therapy, [id number]- Gait training, [phone]- Electrical stimulation (unattended), [id number]- Electrical stimulation (manual), 97016- Vasopneumatic device, 20560 (1-2 muscles), 20561 (3+ muscles)- Dry Needling, Cryotherapy, and Moist heat  PLAN FOR NEXT SESSION: assess HEP response, LE strengthening, balance and gait   Alm JAYSON Kingdom, PT 11/29/2023, 3:31 PM

## 2023-12-13 ENCOUNTER — Ambulatory Visit: Attending: Internal Medicine | Admitting: Physical Therapy

## 2023-12-13 ENCOUNTER — Encounter: Payer: Self-pay | Admitting: Physical Therapy

## 2023-12-13 DIAGNOSIS — M25562 Pain in left knee: Secondary | ICD-10-CM | POA: Insufficient documentation

## 2023-12-13 DIAGNOSIS — M25561 Pain in right knee: Secondary | ICD-10-CM | POA: Insufficient documentation

## 2023-12-13 DIAGNOSIS — M6281 Muscle weakness (generalized): Secondary | ICD-10-CM | POA: Diagnosis present

## 2023-12-13 DIAGNOSIS — M25552 Pain in left hip: Secondary | ICD-10-CM | POA: Insufficient documentation

## 2023-12-13 NOTE — Therapy (Signed)
 OUTPATIENT PHYSICAL THERAPY LOWER EXTREMITY TREATMENT   Patient Name: GLEN KESINGER MRN: [id number] DOB:Apr 16, 1955, 68 y.o., male Today's Date: 12/13/2023  END OF SESSION:  PT End of Session - 12/13/23 0717     Visit Number 2    Number of Visits 9    Date for PT Re-Evaluation 01/24/24    Authorization Type [employer]    PT Start Time [phone]    PT Stop Time 0755    PT Time Calculation (min) 38 min          Past Medical History:  Diagnosis Date   Achalasia    Candida infection, esophageal (HCC) 08/2010   a. 2012 - noted on EGD.   DVT (deep venous thrombosis) (HCC) ~ 04/2014   ?RLE   Dysphagia 2008   post heller myotomy/toupee fundoplication.     ESRD (end stage renal disease) on dialysis [facility])    MWF: [facility] (01/22/2017)   Fall 01/22/2017   mechanical fall w/multiple fractures/notes 01/22/2017   Gait instability    GERD (gastroesophageal reflux disease)    hx (01/22/2017)   Hemorrhoids, internal    History of blood transfusion 03/2014   low counts   History of gout    History of stomach ulcers    HIV infection (HCC) dx ~ 2009   a. 02/13/2014 CD4 = 240 - undetectable viral load.   Hyperlipidemia    Hypertension    hx (01/22/2017)   Insomnia    Lupus nephritis (HCC)    Pancytopenia (HCC)    Premature ventricular contractions    Renal abscess    SLE (systemic lupus erythematosus) (HCC) dx'd 2015   Past Surgical History:  Procedure Laterality Date   BASCILIC VEIN TRANSPOSITION Left 06/29/2014   Procedure: FIRST STAGE  BASCILIC VEIN TRANSPOSITION;  Surgeon: Krystal JULIANNA Doing, MD;  Location: [facility] OR;  Service: Vascular;  Laterality: Left;   BASCILIC VEIN TRANSPOSITION Left 09/11/2014   Procedure: SECOND STAGE BASILIC VEIN TRANSPOSITION LEFT UPPER ARM;  Surgeon: Krystal JULIANNA Doing, MD;  Location: [facility] OR;  Service: Vascular;  Laterality: Left;   BOWEL DECOMPRESSION N/A 09/29/2018   Procedure: BOWEL DECOMPRESSION;  Surgeon: Celestia Agent, MD;  Location: [facility] ENDOSCOPY;  Service:  Endoscopy;  Laterality: N/A;   COLONOSCOPY  2008   .  tortuous colon, internal hemorrhoids.  no polyps or diverticulosis.    ESOPHAGOGASTRODUODENOSCOPY  08/2010   Dr Celestia.  08/2010 candida esophagitis, no obvious esophageal stricture. 02/2006 and 05/2006 dilated esophagus, narrowed distal esophagus but no stricture, was empirically Savary dilated   ESOPHAGOGASTRODUODENOSCOPY N/A 06/27/2014   Procedure: ESOPHAGOGASTRODUODENOSCOPY (EGD);  Surgeon: Norleen Hint, MD;  Location: [facility] ENDOSCOPY;  Service: Endoscopy;  Laterality: N/A;   ESOPHAGOGASTRODUODENOSCOPY N/A 07/02/2014   Procedure: ESOPHAGOGASTRODUODENOSCOPY (EGD);  Surgeon: Norleen Hint, MD;  Location: [facility] ENDOSCOPY;  Service: Endoscopy;  Laterality: N/A;  with botox    FLEXIBLE SIGMOIDOSCOPY N/A 09/29/2018   Procedure: FLEXIBLE SIGMOIDOSCOPY;  Surgeon: Celestia Agent, MD;  Location: [facility] ENDOSCOPY;  Service: Endoscopy;  Laterality: N/A;   HELLER MYOTOMY  1999   with toupee fundoplication of HH.    HERNIA REPAIR     INSERTION OF DIALYSIS CATHETER Right 06/29/2014   Procedure: INSERTION OF DIALYSIS CATHETER RIGHT IJ;  Surgeon: Krystal JULIANNA Doing, MD;  Location: [facility] OR;  Service: Vascular;  Laterality: Right;   KIDNEY TRANSPLANT     January 2020 @ [facility]   NEPHRECTOMY Right ~ 2011   ORIF TIBIA PLATEAU Right 01/23/2017   Procedure: OPEN REDUCTION INTERNAL FIXATION (ORIF)  TIBIAL PLATEAU; REPAIR OF LATERAL TIBIAL PLATEAU; ARTHROTOMY WITH MENISCUS REPAIR; ANTERIOR COMPARTMENT FASCIOTOMY;  Surgeon: Celena Sharper, MD;  Location: [facility] OR;  Service: Orthopedics;  Laterality: Right;   Patient Active Problem List   Diagnosis Date Noted   Monoarthritis of right knee 11/07/2023   High risk medication use 04/13/2022   Avascular necrosis of bone of left hip (HCC) 11/30/2021   Vitamin D  deficiency 11/30/2021   History of creation of ostomy (HCC) 01/22/2019   Volvulus of sigmoid colon S/P colostomy(HCC) 12/31/2018   Adult failure to thrive 12/04/2018   Greater trochanter  fracture (HCC) 10/23/2018   Severe sepsis (HCC) 09/29/2018   Clostridium enterocolitis 09/29/2018   ATN (acute tubular necrosis) (HCC) 09/29/2018   Ileus (HCC) 09/29/2018   Leucopenia    Current chronic use of systemic steroids 09/24/2018   History of esophageal ulcer 09/21/2018   History of renal transplant 09/19/2018   Pelvic fracture (HCC) 08/25/2018   Multiple falls 08/25/2018   Thumb fracture 08/25/2018   Closed nondisplaced zone I fracture of sacrum (HCC) 08/22/2018   Tachycardia 08/22/2018   Suprapubic pain 04/11/2018   Osteoporosis 03/15/2017   Chronic fatigue 03/11/2015   SLE (systemic lupus erythematosus) (HCC) 02/24/2015   AKI (acute kidney injury) (HCC)    Dysphagia    ESRD (end stage renal disease) (HCC)    DVT (deep venous thrombosis) (HCC) 06/22/2014   PVC's (premature ventricular contractions) 03/17/2014   Abdominal pain    Protein-calorie malnutrition, severe (HCC) 02/11/2014   Lupus nephritis (HCC)    Arthralgia of multiple sites, bilateral 02/02/2014   Bilateral low back pain without sciatica 02/01/2014   External hemorrhoids 01/20/2009   Macrocytic anemia 08/31/2006   HIV disease (HCC) 07/24/2006   Hyperlipidemia 05/31/2006   ERECTILE DYSFUNCTION 05/31/2006   INSOMNIA NOS 05/31/2006    PCP: Alec House, MD   REFERRING PROVIDER: Tobie Yetta HERO, MD  REFERRING DIAG: R53.1 (ICD-10-CM) - Weakness   THERAPY DIAG:  Acute pain of right knee  Acute pain of left knee  Rationale for Evaluation and Treatment: Rehabilitation  ONSET DATE: 11/07/2023  SUBJECTIVE:   SUBJECTIVE STATEMENT: No pain on arrival.    Pt presents to PT after recent hospitalization regarding severe R knee pain which was due to possible gout flair. Also moved to L knee, stair navigation and overall mobility has been more labored. Also has L hip pain that is chronic, is thinking about having it replaced in future.   PERTINENT HISTORY: HTN, Kidney transplant, HIV, ORIF R Tibial  Plateau  PAIN:  Are you having pain?  Yes: NPRS scale: 0/10 Pain location: 2/10  Pain description: bilateral knee Aggravating factors: stairs, walking Relieving factors: rest  Are you having pain?  Yes: NPRS scale: 0/10 Pain location: 8/10  Pain description: left hip Aggravating factors: sleeping, walking Relieving factors: rest  PRECAUTIONS: None  RED FLAGS: None   WEIGHT BEARING RESTRICTIONS: No  FALLS:  Has patient fallen in last 6 months? No  LIVING ENVIRONMENT: Lives with: lives alone Lives in: House/apartment  OCCUPATION: Engineer, civil (consulting) at [employer]  PLOF: Independent  PATIENT GOALS: decrease pain, improve balance and mobility with stairs  NEXT MD VISIT: October 2025  OBJECTIVE:  Note: Objective measures were completed at Evaluation unless otherwise noted.  DIAGNOSTIC FINDINGS: See imaging   PATIENT SURVEYS:  PSFS: THE PATIENT SPECIFIC FUNCTIONAL SCALE  Place score of 0-10 (0 = unable to perform activity and 10 = able to perform activity at the same level as before injury or  problem)  Activity Date: 11/29/23    Stairs  7    2. Walking  3    3.     4.      Total Score 5      Total Score = Sum of activity scores/number of activities  Minimally Detectable Change: 3 points (for single activity); 2 points (for average score)  [facility] (nd). The Patient Specific Functional Scale . Retrieved from [email or web address]   COGNITION: Overall cognitive status: Within functional limits for tasks assessed     SENSATION: WFL  PALPATION: TTP to L lateral hip, R quad  LOWER EXTREMITY ROM:  Active ROM Right eval Left eval  Hip flexion    Hip extension    Hip abduction    Hip adduction    Hip internal rotation    Hip external rotation    Knee flexion 125 125  Knee extension 5 0  Ankle dorsiflexion    Ankle plantarflexion    Ankle inversion    Ankle eversion     (Blank rows =  not tested)  LOWER EXTREMITY MMT:  MMT Right eval Left eval  Hip flexion 5 4  Hip extension    Hip abduction    Hip adduction    Hip internal rotation    Hip external rotation    Knee flexion 4 4  Knee extension 4 4  Ankle dorsiflexion    Ankle plantarflexion    Ankle inversion    Ankle eversion     (Blank rows = not tested)   FUNCTIONAL TESTS:  30 Second Sit to Stand: 12 reps with UE  GAIT: Distance walked: 27ft Assistive device utilized: None Level of assistance: Complete Independence Comments: slight antalgic gait; decreased speed during stair navigation, reciprocal gait with no buckling  TREATMENT: [facility] Adult PT Treatment:                                                DATE: 12/13/23 Therapeutic Exercise: Nustep L5 LE only x 5 minutes  LAQ 3# 10 x 3  GTB clam 10 x 3  Banded bridge x 10 QS 2 x 10 each  SLR 10 x 2 each  Seated H/s Stretch 2 x 30 sec each side     [facility] Adult PT Treatment:                                                DATE: 11/29/2023 Therapeutic Exercise: Supine QS x 5 - 5 hold SLR x 5  Hooklying clamshell x 10 GTB LAQ x 5 ea Seated hamstring stretch x 30 R  PATIENT EDUCATION:  Education details: eval findings, PSFS, HEP, POC Person educated: Patient Education method: Explanation, Demonstration, and Handouts Education comprehension: verbalized understanding and returned demonstration  HOME EXERCISE PROGRAM: Access Code: [email or web address] URL: [email or web address] Date: 11/29/2023 Prepared by: Alm Kingdom  Exercises - Supine Quadricep Sets  - 2 x daily - 7 x weekly - 2 sets - 10 reps - 5 sec hold - Active Straight Leg Raise with Quad Set  - 2 x daily - 7 x weekly - 2 sets - 10 reps - Hooklying Clamshell with Resistance  - 2 x daily - 7 x weekly - 3  sets - 10 reps - green band hold - Seated Long Arc Quad  - 2 x daily - 7 x weekly - 3 sets - 10 reps - Seated Hamstring Stretch  - 2 x daily - 7 x weekly - 2-3 reps - 30 sec  hold  ASSESSMENT:  CLINICAL IMPRESSION: Pt reports compliance with HEP and no pain on arrival. Focus of session was review of HEP. He tolerated recommended sets and reps without increased pain.   EVAL: Patient is a 68 y.o. M who was seen today for physical therapy evaluation and treatment for acute knee pain and recent hospitalization along with LE weakness. Physical findings are consistent with subjective and recent hospitalization timeline. Demonstrates decrease in LE strength, general functional mobility, and R knee ROM. Pt would benefit from skilled PT services working on improving LE strength and stability in order to decrease pain and improve stair navigation.   OBJECTIVE IMPAIRMENTS: Abnormal gait, decreased activity tolerance, decreased balance, decreased mobility, difficulty walking, decreased ROM, decreased strength, and pain.   ACTIVITY LIMITATIONS: carrying, lifting, standing, squatting, stairs, transfers, and locomotion level  PARTICIPATION LIMITATIONS: driving, shopping, community activity, occupation, and yard work  PERSONAL FACTORS: Time since onset of injury/illness/exacerbation and 3+ comorbidities: HTN, Kidney transplant, HIV are also affecting patient's functional outcome.   REHAB POTENTIAL: Good  CLINICAL DECISION MAKING: Evolving/moderate complexity  EVALUATION COMPLEXITY: Moderate   GOALS: Goals reviewed with patient? No  SHORT TERM GOALS: Target date: 12/20/2023   Pt will be compliant and knowledgeable with initial HEP for improved comfort and carryover Baseline: initial HEP given  Goal status: INITIAL  2.  Pt will self report knee and hip pain no greater than 5/10 for improved comfort and functional ability Baseline: 8/10 at worst Goal status: INITIAL   LONG TERM GOALS: Target date: 01/24/2024   Pt will improve PSFS to no less than 3 points as proxy for functional improvement with home ADLs and higher level community activity Baseline: 5 Goal status:  INITIAL   2.  Pt will self report bilateral knee and L hip pain no greater than 3/10 for improved comfort and functional ability Baseline: 8/10 at worst Goal status: INITIAL   3.  Pt will increase 30 Second Sit to Stand rep count to no less than 14 reps for improved balance, strength, and functional mobility Baseline: 12 reps  Goal status: INITIAL   4.  Pt will improve all LE MMT to at least 5/5 for improved comfort and functional mobility  Baseline: see chart Goal status: INITIAL  5.  Pt will improve R knee extension AROM to 0 degrees for improved comfort and function Baseline: 5 degrees Goal status: INITIAL    PLAN:  PT FREQUENCY: 1x/week  PT DURATION: 8 weeks  PLANNED INTERVENTIONS: 97164- PT Re-evaluation, 97110-Therapeutic exercises, 97530- Therapeutic activity, [id number]- Neuromuscular re-education, 97535- Self Care, [id number]- Manual therapy, [id number]- Gait training, [phone]- Electrical stimulation (unattended), [id number]- Electrical stimulation (manual), 97016- Vasopneumatic device, 20560 (1-2 muscles), 20561 (3+ muscles)- Dry Needling, Cryotherapy, and Moist heat  PLAN FOR NEXT SESSION: assess HEP response, LE strengthening, balance and gait   Harlene Persons, PTA 12/13/23 7:50 AM Phone: [phone] Fax: [phone]

## 2023-12-18 NOTE — Therapy (Signed)
 OUTPATIENT PHYSICAL THERAPY LOWER EXTREMITY TREATMENT/DISCHARGE  PHYSICAL THERAPY DISCHARGE SUMMARY  Visits from Start of Care: 3  Current functional level related to goals / functional outcomes: See goals and objective   Remaining deficits: See goals and objective   Education / Equipment: HEP   Patient agrees to discharge. Patient goals were met. Patient is being discharged due to meeting the stated rehab goals.   Patient Name: Kenneth [name] MRN: [id number] DOB:Feb 28, 1956, 68 y.o., male Today's Date: 12/19/2023  END OF SESSION:  PT End of Session - 12/19/23 0848     Visit Number 3    Number of Visits 9    Date for PT Re-Evaluation 01/24/24    Authorization Type [employer]    PT Start Time 0845    PT Stop Time 0915    PT Time Calculation (min) 30 min    Activity Tolerance Patient tolerated treatment well    Behavior During Therapy [facility] for tasks assessed/performed           Past Medical History:  Diagnosis Date   Achalasia    Candida infection, esophageal (HCC) 08/2010   a. 2012 - noted on EGD.   DVT (deep venous thrombosis) (HCC) ~ 04/2014   ?RLE   Dysphagia 2008   post heller myotomy/toupee fundoplication.     ESRD (end stage renal disease) on dialysis [facility])    MWF: [facility] (01/22/2017)   Fall 01/22/2017   mechanical fall w/multiple fractures/notes 01/22/2017   Gait instability    GERD (gastroesophageal reflux disease)    hx (01/22/2017)   Hemorrhoids, internal    History of blood transfusion 03/2014   low counts   History of gout    History of stomach ulcers    HIV infection (HCC) dx ~ 2009   a. 02/13/2014 CD4 = 240 - undetectable viral load.   Hyperlipidemia    Hypertension    hx (01/22/2017)   Insomnia    Lupus nephritis (HCC)    Pancytopenia (HCC)    Premature ventricular contractions    Renal abscess    SLE (systemic lupus erythematosus) (HCC) dx'd 2015   Past Surgical History:  Procedure Laterality Date   BASCILIC VEIN  TRANSPOSITION Left 06/29/2014   Procedure: FIRST STAGE  BASCILIC VEIN TRANSPOSITION;  Surgeon: Krystal JULIANNA Doing, MD;  Location: [facility] OR;  Service: Vascular;  Laterality: Left;   BASCILIC VEIN TRANSPOSITION Left 09/11/2014   Procedure: SECOND STAGE BASILIC VEIN TRANSPOSITION LEFT UPPER ARM;  Surgeon: Krystal JULIANNA Doing, MD;  Location: [facility] OR;  Service: Vascular;  Laterality: Left;   BOWEL DECOMPRESSION N/A 09/29/2018   Procedure: BOWEL DECOMPRESSION;  Surgeon: Celestia Agent, MD;  Location: [facility] ENDOSCOPY;  Service: Endoscopy;  Laterality: N/A;   COLONOSCOPY  2008   .  tortuous colon, internal hemorrhoids.  no polyps or diverticulosis.    ESOPHAGOGASTRODUODENOSCOPY  08/2010   Dr Celestia.  08/2010 candida esophagitis, no obvious esophageal stricture. 02/2006 and 05/2006 dilated esophagus, narrowed distal esophagus but no stricture, was empirically Savary dilated   ESOPHAGOGASTRODUODENOSCOPY N/A 06/27/2014   Procedure: ESOPHAGOGASTRODUODENOSCOPY (EGD);  Surgeon: Norleen Hint, MD;  Location: [facility] ENDOSCOPY;  Service: Endoscopy;  Laterality: N/A;   ESOPHAGOGASTRODUODENOSCOPY N/A 07/02/2014   Procedure: ESOPHAGOGASTRODUODENOSCOPY (EGD);  Surgeon: Norleen Hint, MD;  Location: [facility] ENDOSCOPY;  Service: Endoscopy;  Laterality: N/A;  with botox    FLEXIBLE SIGMOIDOSCOPY N/A 09/29/2018   Procedure: FLEXIBLE SIGMOIDOSCOPY;  Surgeon: Celestia Agent, MD;  Location: [facility] ENDOSCOPY;  Service: Endoscopy;  Laterality: N/A;   HELLER MYOTOMY  1999   with toupee fundoplication of HH.    HERNIA REPAIR     INSERTION OF DIALYSIS CATHETER Right 06/29/2014   Procedure: INSERTION OF DIALYSIS CATHETER RIGHT IJ;  Surgeon: Krystal JULIANNA Doing, MD;  Location: [facility] OR;  Service: Vascular;  Laterality: Right;   KIDNEY TRANSPLANT     January 2020 @ [facility]   NEPHRECTOMY Right ~ 2011   ORIF TIBIA PLATEAU Right 01/23/2017   Procedure: OPEN REDUCTION INTERNAL FIXATION (ORIF) TIBIAL PLATEAU; REPAIR OF LATERAL TIBIAL PLATEAU; ARTHROTOMY WITH MENISCUS REPAIR; ANTERIOR  COMPARTMENT FASCIOTOMY;  Surgeon: Celena Sharper, MD;  Location: [facility] OR;  Service: Orthopedics;  Laterality: Right;   Patient Active Problem List   Diagnosis Date Noted   Monoarthritis of right knee 11/07/2023   High risk medication use 04/13/2022   Avascular necrosis of bone of left hip (HCC) 11/30/2021   Vitamin D  deficiency 11/30/2021   History of creation of ostomy (HCC) 01/22/2019   Volvulus of sigmoid colon S/P colostomy(HCC) 12/31/2018   Adult failure to thrive 12/04/2018   Greater trochanter fracture (HCC) 10/23/2018   Severe sepsis (HCC) 09/29/2018   Clostridium enterocolitis 09/29/2018   ATN (acute tubular necrosis) (HCC) 09/29/2018   Ileus (HCC) 09/29/2018   Leucopenia    Current chronic use of systemic steroids 09/24/2018   History of esophageal ulcer 09/21/2018   History of renal transplant 09/19/2018   Pelvic fracture (HCC) 08/25/2018   Multiple falls 08/25/2018   Thumb fracture 08/25/2018   Closed nondisplaced zone I fracture of sacrum (HCC) 08/22/2018   Tachycardia 08/22/2018   Suprapubic pain 04/11/2018   Osteoporosis 03/15/2017   Chronic fatigue 03/11/2015   SLE (systemic lupus erythematosus) (HCC) 02/24/2015   AKI (acute kidney injury) (HCC)    Dysphagia    ESRD (end stage renal disease) (HCC)    DVT (deep venous thrombosis) (HCC) 06/22/2014   PVC's (premature ventricular contractions) 03/17/2014   Abdominal pain    Protein-calorie malnutrition, severe (HCC) 02/11/2014   Lupus nephritis (HCC)    Arthralgia of multiple sites, bilateral 02/02/2014   Bilateral low back pain without sciatica 02/01/2014   External hemorrhoids 01/20/2009   Macrocytic anemia 08/31/2006   HIV disease (HCC) 07/24/2006   Hyperlipidemia 05/31/2006   ERECTILE DYSFUNCTION 05/31/2006   INSOMNIA NOS 05/31/2006    PCP: Alec House, MD   REFERRING PROVIDER: Tobie Yetta HERO, MD  REFERRING DIAG: R53.1 (ICD-10-CM) - Weakness   THERAPY DIAG:  Acute pain of right knee  Acute pain  of left knee  Pain in left hip  Muscle weakness (generalized)  Rationale for Evaluation and Treatment: Rehabilitation  ONSET DATE: 11/07/2023  SUBJECTIVE:   SUBJECTIVE STATEMENT: Pt presents to PT with no current pain, hasn't had pain in a few weeks. Feels good about where he is at and is ready to discharge.   EVAL: Pt presents to PT after recent hospitalization regarding severe R knee pain which was due to possible gout flair. Also moved to L knee, stair navigation and overall mobility has been more labored. Also has L hip pain that is chronic, is thinking about having it replaced in future.   PERTINENT HISTORY: HTN, Kidney transplant, HIV, ORIF R Tibial Plateau  PAIN:  Are you having pain?  Yes: NPRS scale: 0/10 Pain location: 2/10  Pain description: bilateral knee Aggravating factors: stairs, walking Relieving factors: rest  Are you having pain?  Yes: NPRS scale: 0/10 Pain location: 8/10  Pain description: left hip Aggravating factors: sleeping, walking Relieving factors: rest  PRECAUTIONS:  None  RED FLAGS: None   WEIGHT BEARING RESTRICTIONS: No  FALLS:  Has patient fallen in last 6 months? No  LIVING ENVIRONMENT: Lives with: lives alone Lives in: House/apartment  OCCUPATION: Engineer, civil (consulting) at [employer]  PLOF: Independent  PATIENT GOALS: decrease pain, improve balance and mobility with stairs  NEXT MD VISIT: October 2025  OBJECTIVE:  Note: Objective measures were completed at Evaluation unless otherwise noted.  DIAGNOSTIC FINDINGS: See imaging   PATIENT SURVEYS:  PSFS: THE PATIENT SPECIFIC FUNCTIONAL SCALE  Place score of 0-10 (0 = unable to perform activity and 10 = able to perform activity at the same level as before injury or problem)  Activity Date: 11/29/23 Date: 12/19/23   Stairs  7 10   2. Walking  3 10   3. Work  10   4.      Total Score 5 10     Total Score = Sum of activity scores/number of activities  Minimally Detectable Change:  3 points (for single activity); 2 points (for average score)  [facility] (nd). The Patient Specific Functional Scale . Retrieved from [email or web address]   COGNITION: Overall cognitive status: Within functional limits for tasks assessed     SENSATION: WFL  PALPATION: TTP to L lateral hip, R quad  LOWER EXTREMITY ROM:  Active ROM Right eval Left eval Right 12/19/23  Hip flexion     Hip extension     Hip abduction     Hip adduction     Hip internal rotation     Hip external rotation     Knee flexion 125 125   Knee extension 5 0 1  Ankle dorsiflexion     Ankle plantarflexion     Ankle inversion     Ankle eversion      (Blank rows = not tested)  LOWER EXTREMITY MMT:  MMT Right eval Left eval Right 12/19/23 Left 12/19/23  Hip flexion 5 4 5 5   Hip extension      Hip abduction      Hip adduction      Hip internal rotation      Hip external rotation      Knee flexion 4 4 5 5   Knee extension 4 4 5 5   Ankle dorsiflexion      Ankle plantarflexion      Ankle inversion      Ankle eversion       (Blank rows = not tested)   FUNCTIONAL TESTS:  30 Second Sit to Stand: 12 reps with UE 12/19/2023: 14 reps no UE  GAIT: Distance walked: 1ft Assistive device utilized: None Level of assistance: Complete Independence Comments: slight antalgic gait; decreased speed during stair navigation, reciprocal gait with no buckling  TREATMENT: [facility] Adult PT Treatment:                                                DATE: 12/19/23 Rec bike lvl 3 x 3 min for functional activity tolerance Assessment of tests/measures, goals, and outcomes for discharge Supine QS x 10 - 5 hold Supine SLR x 15 Hooklying clamshell x 15 black pain Banded bridge x 10 black band STS x 15 - no UE Seated H/s Stretch 2x30 ea  [facility] Adult PT Treatment:  DATE: 12/13/23 Therapeutic Exercise: Nustep L5  LE only x 5 minutes  LAQ 3# 10 x 3  GTB clam 10 x 3  Banded bridge x 10 QS 2 x 10 each  SLR 10 x 2 each  Seated H/s Stretch 2 x 30 sec each side   [facility] Adult PT Treatment:                                                DATE: 11/29/2023 Therapeutic Exercise: Supine QS x 5 - 5 hold SLR x 5  Hooklying clamshell x 10 GTB LAQ x 5 ea Seated hamstring stretch x 30 R  PATIENT EDUCATION:  Education details: eval findings, PSFS, HEP, POC Person educated: Patient Education method: Explanation, Demonstration, and Handouts Education comprehension: verbalized understanding and returned demonstration  HOME EXERCISE PROGRAM: Access Code: [email or web address] URL: [email or web address] Date: 12/19/2023 Prepared by: Alm Kingdom  Exercises - Supine Quadricep Sets  - 2 x daily - 7 x weekly - 2 sets - 10 reps - 5 sec hold - Active Straight Leg Raise with Quad Set  - 2 x daily - 7 x weekly - 2 sets - 15 reps - Hooklying Clamshell with Resistance  - 2 x daily - 7 x weekly - 3 sets - 10 reps - black band hold - Supine Bridge with Resistance Band  - 1 x daily - 7 x weekly - 3 sets - 10 reps - black band hold - Seated Hamstring Stretch  - 2 x daily - 7 x weekly - 2-3 reps - 30 sec hold  ASSESSMENT:  CLINICAL IMPRESSION: Pt was able to complete all prescribed exercises with no adverse effect and demonstrated knowledge of HEP. Over the course of PT treatment he has progressed very well showing improved in R knee ROM and LE strength as well as decrease in pain and improved functional mobility. He should continue to improve with HEP compliance and is ready to discharge at this time.   EVAL: Patient is a 68 y.o. M who was seen today for physical therapy evaluation and treatment for acute knee pain and recent hospitalization along with LE weakness. Physical findings are consistent with subjective and recent hospitalization timeline. Demonstrates decrease in LE strength, general functional mobility, and R  knee ROM. Pt would benefit from skilled PT services working on improving LE strength and stability in order to decrease pain and improve stair navigation.   OBJECTIVE IMPAIRMENTS: Abnormal gait, decreased activity tolerance, decreased balance, decreased mobility, difficulty walking, decreased ROM, decreased strength, and pain.   ACTIVITY LIMITATIONS: carrying, lifting, standing, squatting, stairs, transfers, and locomotion level  PARTICIPATION LIMITATIONS: driving, shopping, community activity, occupation, and yard work  PERSONAL FACTORS: Time since onset of injury/illness/exacerbation and 3+ comorbidities: HTN, Kidney transplant, HIV are also affecting patient's functional outcome.   REHAB POTENTIAL: Good  CLINICAL DECISION MAKING: Evolving/moderate complexity  EVALUATION COMPLEXITY: Moderate   GOALS: Goals reviewed with patient? No  SHORT TERM GOALS: Target date: 12/20/2023   Pt will be compliant and knowledgeable with initial HEP for improved comfort and carryover Baseline: initial HEP given  Goal status: MET  2.  Pt will self report knee and hip pain no greater than 5/10 for improved comfort and functional ability Baseline: 8/10 at worst 12/19/2023: 0/10 at worst Goal status: MET   LONG TERM GOALS: Target date: 01/24/2024   Pt will  improve PSFS to no less than 3 points as proxy for functional improvement with home ADLs and higher level community activity Baseline: 5 12/19/2023: 10 Goal status: MET   2.  Pt will self report bilateral knee and L hip pain no greater than 3/10 for improved comfort and functional ability Baseline: 8/10 at worst 12/19/2023: 0/10 at worst Goal status: MET   3.  Pt will increase 30 Second Sit to Stand rep count to no less than 14 reps for improved balance, strength, and functional mobility Baseline: 12 reps 12/19/2023: 14 reps Goal status: MET   4.  Pt will improve all LE MMT to at least 5/5 for improved comfort and functional mobility   Baseline: see chart Goal status: MET  5.  Pt will improve R knee extension AROM to 0 degrees for improved comfort and function Baseline: 5 degrees Goal status: MOSTLY MET    PLAN:  PT FREQUENCY: 1x/week  PT DURATION: 8 weeks  PLANNED INTERVENTIONS: 97164- PT Re-evaluation, 97110-Therapeutic exercises, 97530- Therapeutic activity, [id number]- Neuromuscular re-education, 97535- Self Care, [id number]- Manual therapy, [id number]- Gait training, [id number]- Electrical stimulation (unattended), [id number]- Electrical stimulation (manual), 97016- Vasopneumatic device, 20560 (1-2 muscles), 20561 (3+ muscles)- Dry Needling, Cryotherapy, and Moist heat  PLAN FOR NEXT SESSION: assess HEP response, LE strengthening, balance and gait   Alm JAYSON Kingdom PT  12/19/23 9:15 AM

## 2023-12-19 ENCOUNTER — Ambulatory Visit

## 2023-12-19 DIAGNOSIS — M25561 Pain in right knee: Secondary | ICD-10-CM

## 2023-12-19 DIAGNOSIS — M6281 Muscle weakness (generalized): Secondary | ICD-10-CM

## 2023-12-19 DIAGNOSIS — M25562 Pain in left knee: Secondary | ICD-10-CM

## 2023-12-19 DIAGNOSIS — M25552 Pain in left hip: Secondary | ICD-10-CM

## 2023-12-26 ENCOUNTER — Ambulatory Visit: Admitting: Physical Therapy

## 2024-01-07 ENCOUNTER — Telehealth: Payer: Self-pay | Admitting: Pharmacist

## 2024-01-07 DIAGNOSIS — Z7952 Long term (current) use of systemic steroids: Secondary | ICD-10-CM

## 2024-01-07 DIAGNOSIS — Z79899 Other long term (current) drug therapy: Secondary | ICD-10-CM

## 2024-01-07 DIAGNOSIS — M810 Age-related osteoporosis without current pathological fracture: Secondary | ICD-10-CM

## 2024-01-07 DIAGNOSIS — E559 Vitamin D deficiency, unspecified: Secondary | ICD-10-CM

## 2024-01-07 NOTE — Telephone Encounter (Signed)
 Patient due for Prolia  on 02/02/2024. He has f/u appt with Dr. Jeannetta on 02/12/2024 and curently Prolia  due is scheduled for 02/07/2024. Reached out to [employer], Charity fundraiser at [employer] to have this rescheduled.  Will need CMP drawn before receiving next Proia  Kenneth [name], PharmD, MPH, BCPS, CPP Clinical Pharmacist [facility])

## 2024-01-30 NOTE — Progress Notes (Deleted)
 Office Visit Note  Patient: Kenneth [name]             Date of Birth: 04-24-1955           MRN: [id number]             PCP: Alec House, MD Referring: Meredeth Prentice MALVA, MD Visit Date: 02/12/2024   Subjective:  No chief complaint on file.   History of Present Illness: Kenneth [name] is a 68 y.o. male here for follow up for systemic lupus on hydroxychloroquine  200 mg daily. Previous HPI 08/07/2023 Kenneth [name] is a 68 y.o. 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     He has lupus and is currently taking Plaquenil  once daily. His lupus markers, including double-stranded DNA and complements, have been almost negative recently, indicating low disease activity. He has not experienced any major medical events or illnesses recently.   He has osteoarthritis, primarily affecting his hip. He describes the hip pain as 'bearable' and notes that it 'comes and goes'. He is not using any medications or patches for the hip pain and is not performing any specific stretches or exercises. He is currently managing the pain with Tylenol .   He has osteoporosis related to his kidney disease and long term steroid use. He is receiving Prolia  injections to manage this condition, with the most recent injection administered yesterday.     Labs reviewed 08/01/23 Creatinine 2.15 EGFR 33 Protein creatinine ratio 279 CBC normal Vitamin D  52 Urine culture positive Klebsiella pneumoniae   Previous HPI 02/07/2023 Kenneth [name] is a 68 y.o. 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  He is overall doing well without any major events or change in status.  Left hip and thigh pain still  ongoing.  Most severely notices pain after sitting for any prolonged periods and then standing back up.  Sometimes interrupts his sleep due to pain.  He was recommended to consider [name] consultation for AVN as he sees them for his transplant nephrology but so far has not proceeded with any surgical plans either locally or at their facility.  Otherwise no new skin rashes, ulcers, no peripheral swelling.  Last Prolia  injection on November 4 without complication.   Previous HPI 08/07/2022 Kenneth [name] is a 68 y.o. 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  Also for osteoporosis on Prolia  last dose on May 2.  Recent labs did show vitamin D  deficiency metabolic panel otherwise stable.  He continues having left hip pain most noticeable getting up after spending a long duration in seated position this gets somewhat better once he is up and moving.  No other joint pain complaints.  He has not experienced any visible swelling or rashes.  No significant interval infections.   Previous HPI 04/13/22 Kenneth [name] is a 68 y.o. male here for follow up for systemic lupus on hydroxychloroquine  200 mg daily he is also on Prograf  1 mg AM and 0.5 mg PM and prednisone  5 mg for maintenance of  renal transplant.  Workup at our initial visit did show some persistent elevated lab markers of inflammation including sedimentation rate and low positive double-stranded DNA.  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  Overall he has been doing well with no flareups or symptom exacerbation.   Previous HPI 11/30/21 Kenneth [name] is a 68 y.o. male here to establish care for systemic lupus.  Medical history is also significant for  well-controlled HIV. 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  He saw Dr. Leni for local rheumatology provider previously.  Initial symptoms included joint pain and swelling in multiple areas as well as severe fatigue and the kidney inflammation.  He was previously treated with hydroxychloroquine  and oral prednisone  for lupus though extrarenal disease activity did not appear to include other organ threatening involvement and was on hemodialysis shortly after diagnosis.  Joint pains have been pretty well controlled recently his worst complaint is in the left leg raise had stiffness and soreness ongoing in the past few months.  Pain in the thigh and knee lasting several minutes each morning but she describes some clicking or cracking sensation.  He was restarted on the hydroxychloroquine  suspicious of redevelopment for lupus related arthralgias symptoms are a little bit better today but has not been on the medication very long.  He does have numbness affecting the toes of his feet there is no radiation of pain down the leg no extension of numbness past the forefoot.   He had a complicated series of medical events in 2020 he underwent renal transplant immunosuppression originally CellCept  then Prograf  and prednisone  which is his current regimen.  Had complication from multiple falls resulting in a large fracture of the sacrum and fracture of the greater trochanter.  Fragility fractures attributed to chronic renal disease and long-term use of glucocorticoids.  He also developed severe infection complicated by C. difficile requiring partial colectomy and temporarily had an ostomy for managing this.  He was put on treatment with Prolia  for about the past 3 years most recent dose approximately 3 months ago.   He denies any history of Raynaud's symptoms no oral or nasal ulcers no alopecia is no  photosensitive skin rashes.   No Rheumatology ROS completed.   PMFS History:  Patient Active Problem List   Diagnosis Date Noted   Monoarthritis of right knee 11/07/2023   High risk medication use 04/13/2022   Avascular necrosis of bone of left hip (HCC) 11/30/2021   Vitamin D  deficiency 11/30/2021   History of creation of ostomy (HCC) 01/22/2019   Volvulus of sigmoid colon S/P colostomy(HCC) 12/31/2018   Adult failure to thrive 12/04/2018   Greater trochanter fracture (HCC) 10/23/2018   Severe sepsis (HCC) 09/29/2018   Clostridium enterocolitis 09/29/2018   ATN (acute tubular necrosis) 09/29/2018   Ileus (HCC) 09/29/2018   Leucopenia    Current chronic use of systemic steroids 09/24/2018   History of esophageal ulcer 09/21/2018   History of renal transplant 09/19/2018   Pelvic fracture (HCC) 08/25/2018   Multiple falls 08/25/2018   Thumb fracture 08/25/2018   Closed nondisplaced zone I fracture of sacrum (HCC) 08/22/2018   Tachycardia 08/22/2018   Suprapubic pain 04/11/2018   Osteoporosis 03/15/2017   Chronic fatigue 03/11/2015   SLE (systemic lupus erythematosus) (HCC) 02/24/2015   AKI (acute kidney injury) (HCC)  Dysphagia    ESRD (end stage renal disease) (HCC)    DVT (deep venous thrombosis) (HCC) 06/22/2014   PVC's (premature ventricular contractions) 03/17/2014   Abdominal pain    Protein-calorie malnutrition, severe 02/11/2014   Lupus nephritis (HCC)    Arthralgia of multiple sites, bilateral 02/02/2014   Bilateral low back pain without sciatica 02/01/2014   External hemorrhoids 01/20/2009   Macrocytic anemia 08/31/2006   HIV disease (HCC) 07/24/2006   Hyperlipidemia 05/31/2006   ERECTILE DYSFUNCTION 05/31/2006   INSOMNIA NOS 05/31/2006    Past Medical History:  Diagnosis Date   Achalasia    Candida infection, esophageal (HCC) 08/2010   a. 2012 - noted on EGD.   DVT (deep venous thrombosis) (HCC) ~ 04/2014   ?RLE   Dysphagia 2008   post heller  myotomy/toupee fundoplication.     ESRD (end stage renal disease) on dialysis [facility])    MWF: [facility] (01/22/2017)   Fall 01/22/2017   mechanical fall w/multiple fractures/notes 01/22/2017   Gait instability    GERD (gastroesophageal reflux disease)    hx (01/22/2017)   Hemorrhoids, internal    History of blood transfusion 03/2014   low counts   History of gout    History of stomach ulcers    HIV infection (HCC) dx ~ 2009   a. 02/13/2014 CD4 = 240 - undetectable viral load.   Hyperlipidemia    Hypertension    hx (01/22/2017)   Insomnia    Lupus nephritis (HCC)    Pancytopenia (HCC)    Premature ventricular contractions    Renal abscess    SLE (systemic lupus erythematosus) (HCC) dx'd 2015    Family History  Problem Relation Age of Onset   Other Mother        s/p pacemaker - alive and well.   Hypertension Mother    Colon cancer Father        deceased   Ovarian cancer Sister    Prostate cancer Brother    Other Other        5 brothers, 3 sisters - alive and well.   Past Surgical History:  Procedure Laterality Date   BASCILIC VEIN TRANSPOSITION Left 06/29/2014   Procedure: FIRST STAGE  BASCILIC VEIN TRANSPOSITION;  Surgeon: Krystal JULIANNA Doing, MD;  Location: [facility] OR;  Service: Vascular;  Laterality: Left;   BASCILIC VEIN TRANSPOSITION Left 09/11/2014   Procedure: SECOND STAGE BASILIC VEIN TRANSPOSITION LEFT UPPER ARM;  Surgeon: Krystal JULIANNA Doing, MD;  Location: [facility] OR;  Service: Vascular;  Laterality: Left;   BOWEL DECOMPRESSION N/A 09/29/2018   Procedure: BOWEL DECOMPRESSION;  Surgeon: Celestia Agent, MD;  Location: [facility] ENDOSCOPY;  Service: Endoscopy;  Laterality: N/A;   COLONOSCOPY  2008   .  tortuous colon, internal hemorrhoids.  no polyps or diverticulosis.    ESOPHAGOGASTRODUODENOSCOPY  08/2010   Dr Celestia.  08/2010 candida esophagitis, no obvious esophageal stricture. 02/2006 and 05/2006 dilated esophagus, narrowed distal esophagus but no stricture, was empirically Savary dilated    ESOPHAGOGASTRODUODENOSCOPY N/A 06/27/2014   Procedure: ESOPHAGOGASTRODUODENOSCOPY (EGD);  Surgeon: Norleen Hint, MD;  Location: [facility] ENDOSCOPY;  Service: Endoscopy;  Laterality: N/A;   ESOPHAGOGASTRODUODENOSCOPY N/A 07/02/2014   Procedure: ESOPHAGOGASTRODUODENOSCOPY (EGD);  Surgeon: Norleen Hint, MD;  Location: [facility] ENDOSCOPY;  Service: Endoscopy;  Laterality: N/A;  with botox    FLEXIBLE SIGMOIDOSCOPY N/A 09/29/2018   Procedure: FLEXIBLE SIGMOIDOSCOPY;  Surgeon: Celestia Agent, MD;  Location: [facility] ENDOSCOPY;  Service: Endoscopy;  Laterality: N/A;   HELLER MYOTOMY  1999   with  toupee fundoplication of HH.    HERNIA REPAIR     INSERTION OF DIALYSIS CATHETER Right 06/29/2014   Procedure: INSERTION OF DIALYSIS CATHETER RIGHT IJ;  Surgeon: Krystal JULIANNA Doing, MD;  Location: [facility] OR;  Service: Vascular;  Laterality: Right;   KIDNEY TRANSPLANT     January 2020 @ [facility]   NEPHRECTOMY Right ~ 2011   ORIF TIBIA PLATEAU Right 01/23/2017   Procedure: OPEN REDUCTION INTERNAL FIXATION (ORIF) TIBIAL PLATEAU; REPAIR OF LATERAL TIBIAL PLATEAU; ARTHROTOMY WITH MENISCUS REPAIR; ANTERIOR COMPARTMENT FASCIOTOMY;  Surgeon: Celena Sharper, MD;  Location: [facility] OR;  Service: Orthopedics;  Laterality: Right;   Social History   Social History Narrative   Lives in [place] by himself. Does not routinely exercise.   No caffeine   Immunization History  Administered Date(s) Administered   DTaP / Hep B / IPV 08/31/2006, 10/02/2006, 02/19/2007   Hepatitis A, Adult 08/23/2017, 03/21/2018   Hepatitis B 08/31/2006, 10/02/2006, 02/19/2007   Hepatitis B, ADULT 06/11/2015, 07/16/2015, 08/13/2015, 12/17/2015, 05/19/2016, 06/16/2016, 07/14/2016, 11/11/2016, 08/23/2017, 08/23/2017, 03/21/2018, 03/21/2018, 04/23/2018, 04/23/2018   Influenza Whole 01/22/2008   Influenza, Seasonal, Injecte, Preservative Fre 04/24/2016, 01/05/2017   Influenza-Unspecified 02/02/2016, 01/15/2017, 01/01/2018, 12/30/2018   PFIZER Comirnaty(Gray Top)Covid-19 Tri-Sucrose Vaccine  11/25/2019, 05/04/2020   Pfizer Covid-19 Vaccine Bivalent Booster 33yrs & up 12/31/2020   Pneumococcal Conjugate-13 02/27/2017   Pneumococcal Polysaccharide-23 08/31/2006, 11/26/2015, 07/10/2017   Td 11/01/2004   Tdap 07/10/2017     Objective: Vital Signs: There were no vitals taken for this visit.   Physical Exam   Musculoskeletal Exam: ***  CDAI Exam: CDAI Score: -- Patient Global: --; Provider Global: -- Swollen: --; Tender: -- Joint Exam 02/12/2024   No joint exam has been documented for this visit   There is currently no information documented on the homunculus. Go to the Rheumatology activity and complete the homunculus joint exam.  Investigation: No additional findings.  Imaging: No results found.  Recent Labs: Lab Results  Component Value Date   WBC 5.8 11/10/2023   HGB 11.4 (L) 11/10/2023   PLT 136 (L) 11/10/2023   NA 137 11/10/2023   K 4.1 11/10/2023   CL 110 11/10/2023   CO2 19 (L) 11/10/2023   GLUCOSE 108 (H) 11/10/2023   BUN 26 (H) 11/10/2023   CREATININE 2.26 (H) 11/10/2023   BILITOT 1.2 11/07/2023   ALKPHOS 51 11/07/2023   AST 19 11/07/2023   ALT 16 11/07/2023   PROT 8.4 (H) 11/07/2023   ALBUMIN 2.7 (L) 11/10/2023   CALCIUM  8.0 (L) 11/10/2023   GFRAA 18 (L) 07/06/2019    Speciality Comments: PLQ Eye Exam: 08/29/2023 WNL @ [facility] Follow up in 1 year.   Procedures:  No procedures performed Allergies: Nsaids   Assessment / Plan:     Visit Diagnoses: No diagnosis found.  ***  Orders: No orders of the defined types were placed in this encounter.  No orders of the defined types were placed in this encounter.    Follow-Up Instructions: No follow-ups on file.   Tate Zagal M Zaray Gatchel, CMA  Note - This record has been created using Animal nutritionist.  Chart creation errors have been sought, but may not always  have been located. Such creation errors do not reflect on  the standard of medical care.

## 2024-02-07 ENCOUNTER — Ambulatory Visit

## 2024-02-11 NOTE — Telephone Encounter (Signed)
 Patient to stop by tomorrow morning for Prolia  labs.

## 2024-02-12 ENCOUNTER — Ambulatory Visit: Admitting: Internal Medicine

## 2024-02-12 ENCOUNTER — Other Ambulatory Visit: Payer: Self-pay | Admitting: *Deleted

## 2024-02-12 DIAGNOSIS — Z79899 Other long term (current) drug therapy: Secondary | ICD-10-CM

## 2024-02-12 DIAGNOSIS — E559 Vitamin D deficiency, unspecified: Secondary | ICD-10-CM

## 2024-02-12 DIAGNOSIS — M810 Age-related osteoporosis without current pathological fracture: Secondary | ICD-10-CM

## 2024-02-12 DIAGNOSIS — Z7952 Long term (current) use of systemic steroids: Secondary | ICD-10-CM

## 2024-02-12 DIAGNOSIS — M87052 Idiopathic aseptic necrosis of left femur: Secondary | ICD-10-CM

## 2024-02-12 DIAGNOSIS — M328 Other forms of systemic lupus erythematosus: Secondary | ICD-10-CM

## 2024-02-13 ENCOUNTER — Ambulatory Visit: Payer: Self-pay | Admitting: Pharmacist

## 2024-02-13 ENCOUNTER — Ambulatory Visit (INDEPENDENT_AMBULATORY_CARE_PROVIDER_SITE_OTHER)

## 2024-02-13 VITALS — BP 163/80 | HR 65 | Temp 97.7°F | Resp 20 | Ht 71.0 in | Wt 149.0 lb

## 2024-02-13 DIAGNOSIS — M810 Age-related osteoporosis without current pathological fracture: Secondary | ICD-10-CM

## 2024-02-13 DIAGNOSIS — S329XXS Fracture of unspecified parts of lumbosacral spine and pelvis, sequela: Secondary | ICD-10-CM

## 2024-02-13 LAB — COMPREHENSIVE METABOLIC PANEL WITH GFR
AG Ratio: 1.1 (calc) (ref 1.0–2.5)
ALT: 18 U/L (ref 9–46)
AST: 25 U/L (ref 10–35)
Albumin: 4 g/dL (ref 3.6–5.1)
Alkaline phosphatase (APISO): 57 U/L (ref 35–144)
BUN/Creatinine Ratio: 8 (calc) (ref 6–22)
BUN: 16 mg/dL (ref 7–25)
CO2: 21 mmol/L (ref 20–32)
Calcium: 9.7 mg/dL (ref 8.6–10.3)
Chloride: 106 mmol/L (ref 98–110)
Creat: 1.95 mg/dL — ABNORMAL HIGH (ref 0.70–1.35)
Globulin: 3.6 g/dL (ref 1.9–3.7)
Glucose, Bld: 82 mg/dL (ref 65–99)
Potassium: 5.1 mmol/L (ref 3.5–5.3)
Sodium: 140 mmol/L (ref 135–146)
Total Bilirubin: 0.3 mg/dL (ref 0.2–1.2)
Total Protein: 7.6 g/dL (ref 6.1–8.1)
eGFR: 37 mL/min/1.73m2 — ABNORMAL LOW (ref >=60)

## 2024-02-13 LAB — VITAMIN D 25 HYDROXY (VIT D DEFICIENCY, FRACTURES): Vit D, 25-Hydroxy: 56 ng/mL (ref 30–100)

## 2024-02-13 MED ORDER — DENOSUMAB 60 MG/ML ~~LOC~~ SOSY
60.0000 mg | PREFILLED_SYRINGE | Freq: Once | SUBCUTANEOUS | Status: AC
  Administered 2024-02-13: 60 mg via SUBCUTANEOUS
  Filled 2024-02-13: qty 1

## 2024-02-13 NOTE — Progress Notes (Signed)
 Diagnosis: Osteoporosis  Provider:  Chilton Greathouse MD  Procedure: Injection  Prolia (Denosumab), Dose: 60 mg, Site: subcutaneous, Number of injections: 1  Injection Site(s): Right arm  Post Care:  right arm injection  Discharge: Condition: Good, Destination: Home . AVS Provided  Performed by:  Rico Ala, LPN

## 2024-03-28 ENCOUNTER — Other Ambulatory Visit: Payer: Self-pay | Admitting: Internal Medicine

## 2024-03-28 DIAGNOSIS — M328 Other forms of systemic lupus erythematosus: Secondary | ICD-10-CM

## 2024-03-28 NOTE — Telephone Encounter (Signed)
 Last Fill: 08/07/2023  Eye exam: 08/29/2023 WNL   Labs: 02/12/2024 CMP Creat 1.95 eGFR 37 Rest of CMP WNL 12/14/2023 CBC Hemoglobin 13.1 MCV 103 RBC 3.98   Next Visit: 04/30/2024  Last Visit: 08/07/2023  DX: Other forms of systemic lupus erythematosus, unspecified organ involvement status   Current Dose per office note 08/07/2023: hydroxychloroquine  200 mg daily.   Okay to refill Plaquenil ?

## 2024-04-17 NOTE — Assessment & Plan Note (Addendum)
 Continue prolia  treatment, next does due May 2026.

## 2024-04-17 NOTE — Assessment & Plan Note (Addendum)
 Intermittent ankle swelling without pain. Does nto seem to be in exacerbation of SLE. Inflammatory arthirtis episode appears 2/2 gou and considerable hyperurecemia. - Continue hydroxychloroquine  200 mg once daily. Orders:   Sedimentation rate   Anti-DNA antibody, double-stranded   hydroxychloroquine  (PLAQUENIL ) 200 MG tablet; Take 1 tablet (200 mg total) by mouth daily.

## 2024-04-17 NOTE — Assessment & Plan Note (Addendum)
 Okay to continue supportive management, long term would likely benefit with surgery but holding off. Can refer to [facility]/Dr. Vernetta as good option if symptoms are such he would be open to procedures.

## 2024-04-17 NOTE — Assessment & Plan Note (Deleted)
 SABRA

## 2024-04-30 ENCOUNTER — Encounter: Payer: Self-pay | Admitting: Internal Medicine

## 2024-04-30 ENCOUNTER — Ambulatory Visit: Attending: Internal Medicine | Admitting: Internal Medicine

## 2024-04-30 VITALS — BP 160/75 | HR 63 | Temp 97.8°F | Resp 16 | Ht 71.0 in | Wt 158.5 lb

## 2024-04-30 DIAGNOSIS — M1030 Gout due to renal impairment, unspecified site: Secondary | ICD-10-CM | POA: Insufficient documentation

## 2024-04-30 DIAGNOSIS — M10361 Gout due to renal impairment, right knee: Secondary | ICD-10-CM | POA: Diagnosis not present

## 2024-04-30 DIAGNOSIS — M810 Age-related osteoporosis without current pathological fracture: Secondary | ICD-10-CM

## 2024-04-30 DIAGNOSIS — M87052 Idiopathic aseptic necrosis of left femur: Secondary | ICD-10-CM

## 2024-04-30 DIAGNOSIS — M328 Other forms of systemic lupus erythematosus: Secondary | ICD-10-CM

## 2024-04-30 DIAGNOSIS — Z79899 Other long term (current) drug therapy: Secondary | ICD-10-CM

## 2024-04-30 MED ORDER — HYDROXYCHLOROQUINE SULFATE 200 MG PO TABS: 200.0000 mg | ORAL_TABLET | Freq: Every day | ORAL | 1 refills | Status: AC

## 2024-04-30 MED ORDER — ALLOPURINOL 100 MG PO TABS: 100.0000 mg | ORAL_TABLET | Freq: Every day | ORAL | 1 refills | Status: AC

## 2024-04-30 NOTE — Assessment & Plan Note (Addendum)
 Gout flare in August with knee involvement. Uric acid level 11, indicating high risk. Current allopurinol  dose likely insufficient due to impaired excretion from kidney disease. - Ordered blood test to recheck uric acid level. - Increase allopurinol  to 100 mg daily if uric acid remains high. - Provided educational materials on dietary management of gout. Orders:   Uric acid   allopurinol  (ZYLOPRIM ) 100 MG tablet; Take 1 tablet (100 mg total) by mouth daily.

## 2024-05-01 LAB — URIC ACID: Uric Acid, Serum: 8.6 mg/dL — ABNORMAL HIGH (ref 4.0–8.0)

## 2024-05-01 LAB — SEDIMENTATION RATE: Sed Rate: 9 mm/h (ref 0–20)

## 2024-05-01 LAB — ANTI-DNA ANTIBODY, DOUBLE-STRANDED: ds DNA Ab: 16 [IU]/mL — ABNORMAL HIGH

## 2024-08-13 ENCOUNTER — Ambulatory Visit

## 2024-10-28 ENCOUNTER — Ambulatory Visit: Admitting: Internal Medicine
